# Patient Record
Sex: Male | Born: 1955 | Race: White | Hispanic: No | Marital: Single | Smoking: Never smoker
Health system: Southern US, Community
[De-identification: ages and names within clinical notes are randomized; demographics above are authoritative.]

## PROBLEM LIST (undated history)

## (undated) DIAGNOSIS — I255 Ischemic cardiomyopathy: Secondary | ICD-10-CM

## (undated) DIAGNOSIS — G4733 Obstructive sleep apnea (adult) (pediatric): Secondary | ICD-10-CM

## (undated) DIAGNOSIS — I2510 Atherosclerotic heart disease of native coronary artery without angina pectoris: Secondary | ICD-10-CM

## (undated) DIAGNOSIS — E119 Type 2 diabetes mellitus without complications: Secondary | ICD-10-CM

## (undated) DIAGNOSIS — E781 Pure hyperglyceridemia: Secondary | ICD-10-CM

## (undated) DIAGNOSIS — I7121 Aneurysm of the ascending aorta, without rupture: Secondary | ICD-10-CM

## (undated) DIAGNOSIS — E785 Hyperlipidemia, unspecified: Secondary | ICD-10-CM

## (undated) DIAGNOSIS — I480 Paroxysmal atrial fibrillation: Secondary | ICD-10-CM

## (undated) HISTORY — DX: Type 2 diabetes mellitus without complications: E11.9

## (undated) HISTORY — DX: Hyperlipidemia, unspecified: E78.5

## (undated) HISTORY — DX: Atherosclerotic heart disease of native coronary artery without angina pectoris: I25.10

## (undated) HISTORY — DX: Aneurysm of the ascending aorta, without rupture: I71.21

## (undated) HISTORY — DX: Ischemic cardiomyopathy: I25.5

## (undated) HISTORY — DX: Paroxysmal atrial fibrillation: I48.0

## (undated) HISTORY — PX: ROTATOR CUFF REPAIR: SHX139

---

## 2010-10-21 DIAGNOSIS — G459 Transient cerebral ischemic attack, unspecified: Secondary | ICD-10-CM

## 2010-10-21 HISTORY — DX: Transient cerebral ischemic attack, unspecified: G45.9

## 2015-09-27 DIAGNOSIS — K631 Perforation of intestine (nontraumatic): Secondary | ICD-10-CM

## 2015-09-27 HISTORY — DX: Perforation of intestine (nontraumatic): K63.1

## 2015-10-02 DIAGNOSIS — K6819 Other retroperitoneal abscess: Secondary | ICD-10-CM | POA: Insufficient documentation

## 2015-10-02 HISTORY — DX: Other retroperitoneal abscess: K68.19

## 2016-01-25 DIAGNOSIS — I10 Essential (primary) hypertension: Secondary | ICD-10-CM | POA: Diagnosis present

## 2016-01-25 HISTORY — DX: Essential (primary) hypertension: I10

## 2016-04-09 DIAGNOSIS — E559 Vitamin D deficiency, unspecified: Secondary | ICD-10-CM | POA: Insufficient documentation

## 2016-04-15 DIAGNOSIS — E785 Hyperlipidemia, unspecified: Secondary | ICD-10-CM | POA: Insufficient documentation

## 2016-10-07 DIAGNOSIS — H18601 Keratoconus, unspecified, right eye: Secondary | ICD-10-CM

## 2016-10-07 DIAGNOSIS — Z947 Corneal transplant status: Secondary | ICD-10-CM

## 2016-10-07 HISTORY — DX: Corneal transplant status: Z94.7

## 2016-10-07 HISTORY — DX: Keratoconus, unspecified, right eye: H18.601

## 2016-12-13 DIAGNOSIS — E11649 Type 2 diabetes mellitus with hypoglycemia without coma: Secondary | ICD-10-CM

## 2016-12-13 DIAGNOSIS — Z794 Long term (current) use of insulin: Secondary | ICD-10-CM

## 2018-02-02 DIAGNOSIS — N401 Enlarged prostate with lower urinary tract symptoms: Secondary | ICD-10-CM

## 2018-02-02 DIAGNOSIS — N138 Other obstructive and reflux uropathy: Secondary | ICD-10-CM

## 2018-02-02 HISTORY — DX: Benign prostatic hyperplasia with lower urinary tract symptoms: N40.1

## 2018-02-02 HISTORY — DX: Benign prostatic hyperplasia with lower urinary tract symptoms: N13.8

## 2018-06-15 DIAGNOSIS — Z961 Presence of intraocular lens: Secondary | ICD-10-CM | POA: Insufficient documentation

## 2018-06-15 DIAGNOSIS — H5213 Myopia, bilateral: Secondary | ICD-10-CM

## 2018-06-15 HISTORY — DX: Myopia, bilateral: H52.13

## 2018-06-15 HISTORY — DX: Presence of intraocular lens: Z96.1

## 2018-07-08 ENCOUNTER — Other Ambulatory Visit: Payer: Self-pay | Admitting: Internal Medicine

## 2018-07-08 DIAGNOSIS — C61 Malignant neoplasm of prostate: Secondary | ICD-10-CM

## 2018-08-21 ENCOUNTER — Other Ambulatory Visit: Payer: Self-pay

## 2018-10-01 ENCOUNTER — Other Ambulatory Visit: Payer: Self-pay | Admitting: Urology

## 2018-10-01 ENCOUNTER — Ambulatory Visit
Admission: RE | Admit: 2018-10-01 | Discharge: 2018-10-01 | Disposition: A | Discharge status: Home / Self Care | Payer: BLUE CROSS/BLUE SHIELD | Source: Ambulatory Visit | Attending: Internal Medicine | Admitting: Internal Medicine

## 2018-10-01 DIAGNOSIS — C61 Malignant neoplasm of prostate: Secondary | ICD-10-CM

## 2018-10-01 MED ORDER — GADOBENATE DIMEGLUMINE 529 MG/ML IV SOLN
10.0000 mL | Freq: Once | INTRAVENOUS | Status: AC | PRN
  Administered 2018-10-01: 10 mL via INTRAVENOUS

## 2019-09-20 DIAGNOSIS — M75122 Complete rotator cuff tear or rupture of left shoulder, not specified as traumatic: Secondary | ICD-10-CM

## 2019-09-20 HISTORY — DX: Complete rotator cuff tear or rupture of left shoulder, not specified as traumatic: M75.122

## 2019-10-22 DIAGNOSIS — C61 Malignant neoplasm of prostate: Secondary | ICD-10-CM

## 2019-10-22 DIAGNOSIS — Q6612 Congenital talipes calcaneovarus, left foot: Secondary | ICD-10-CM | POA: Insufficient documentation

## 2019-10-22 HISTORY — DX: Congenital talipes calcaneovarus, left foot: Q66.12

## 2019-10-22 HISTORY — DX: Malignant neoplasm of prostate: C61

## 2019-12-31 DIAGNOSIS — Z8631 Personal history of diabetic foot ulcer: Secondary | ICD-10-CM | POA: Insufficient documentation

## 2019-12-31 HISTORY — DX: Personal history of diabetic foot ulcer: Z86.31

## 2020-10-21 HISTORY — PX: BELOW KNEE LEG AMPUTATION: SUR23

## 2021-03-31 DIAGNOSIS — F5104 Psychophysiologic insomnia: Secondary | ICD-10-CM | POA: Insufficient documentation

## 2021-06-28 DIAGNOSIS — Z794 Long term (current) use of insulin: Secondary | ICD-10-CM

## 2021-06-28 DIAGNOSIS — E1165 Type 2 diabetes mellitus with hyperglycemia: Secondary | ICD-10-CM

## 2021-06-28 HISTORY — DX: Long term (current) use of insulin: Z79.4

## 2021-06-28 HISTORY — DX: Type 2 diabetes mellitus with hyperglycemia: E11.65

## 2021-06-29 DIAGNOSIS — E440 Moderate protein-calorie malnutrition: Secondary | ICD-10-CM | POA: Diagnosis present

## 2021-07-27 ENCOUNTER — Inpatient Hospital Stay (HOSPITAL_COMMUNITY): Payer: BC Managed Care – PPO

## 2021-07-27 ENCOUNTER — Inpatient Hospital Stay (HOSPITAL_COMMUNITY)
Admission: EM | Admit: 2021-07-27 | Discharge: 2021-07-30 | DRG: 070 | Disposition: A | Discharge status: Home Health | Payer: BC Managed Care – PPO | Attending: Internal Medicine | Admitting: Internal Medicine

## 2021-07-27 ENCOUNTER — Emergency Department (HOSPITAL_COMMUNITY): Payer: BC Managed Care – PPO

## 2021-07-27 DIAGNOSIS — E872 Acidosis, unspecified: Secondary | ICD-10-CM | POA: Diagnosis present

## 2021-07-27 DIAGNOSIS — Z7902 Long term (current) use of antithrombotics/antiplatelets: Secondary | ICD-10-CM

## 2021-07-27 DIAGNOSIS — R17 Unspecified jaundice: Secondary | ICD-10-CM | POA: Diagnosis present

## 2021-07-27 DIAGNOSIS — R4182 Altered mental status, unspecified: Secondary | ICD-10-CM | POA: Diagnosis not present

## 2021-07-27 DIAGNOSIS — A419 Sepsis, unspecified organism: Secondary | ICD-10-CM | POA: Diagnosis present

## 2021-07-27 DIAGNOSIS — Z7984 Long term (current) use of oral hypoglycemic drugs: Secondary | ICD-10-CM | POA: Diagnosis not present

## 2021-07-27 DIAGNOSIS — E1169 Type 2 diabetes mellitus with other specified complication: Secondary | ICD-10-CM | POA: Diagnosis present

## 2021-07-27 DIAGNOSIS — Z7985 Long-term (current) use of injectable non-insulin antidiabetic drugs: Secondary | ICD-10-CM | POA: Diagnosis not present

## 2021-07-27 DIAGNOSIS — Z20822 Contact with and (suspected) exposure to covid-19: Secondary | ICD-10-CM | POA: Diagnosis present

## 2021-07-27 DIAGNOSIS — N3000 Acute cystitis without hematuria: Secondary | ICD-10-CM | POA: Diagnosis present

## 2021-07-27 DIAGNOSIS — Z79891 Long term (current) use of opiate analgesic: Secondary | ICD-10-CM | POA: Diagnosis not present

## 2021-07-27 DIAGNOSIS — I6601 Occlusion and stenosis of right middle cerebral artery: Secondary | ICD-10-CM | POA: Diagnosis present

## 2021-07-27 DIAGNOSIS — I10 Essential (primary) hypertension: Secondary | ICD-10-CM | POA: Diagnosis present

## 2021-07-27 DIAGNOSIS — G9341 Metabolic encephalopathy: Principal | ICD-10-CM | POA: Diagnosis present

## 2021-07-27 DIAGNOSIS — Z8673 Personal history of transient ischemic attack (TIA), and cerebral infarction without residual deficits: Secondary | ICD-10-CM | POA: Diagnosis not present

## 2021-07-27 DIAGNOSIS — E162 Hypoglycemia, unspecified: Secondary | ICD-10-CM

## 2021-07-27 DIAGNOSIS — Z794 Long term (current) use of insulin: Secondary | ICD-10-CM | POA: Diagnosis not present

## 2021-07-27 DIAGNOSIS — N401 Enlarged prostate with lower urinary tract symptoms: Secondary | ICD-10-CM | POA: Diagnosis present

## 2021-07-27 DIAGNOSIS — E782 Mixed hyperlipidemia: Secondary | ICD-10-CM | POA: Diagnosis present

## 2021-07-27 DIAGNOSIS — R6510 Systemic inflammatory response syndrome (SIRS) of non-infectious origin without acute organ dysfunction: Secondary | ICD-10-CM | POA: Diagnosis present

## 2021-07-27 DIAGNOSIS — X58XXXA Exposure to other specified factors, initial encounter: Secondary | ICD-10-CM | POA: Diagnosis present

## 2021-07-27 DIAGNOSIS — I609 Nontraumatic subarachnoid hemorrhage, unspecified: Secondary | ICD-10-CM

## 2021-07-27 DIAGNOSIS — S066XAA Traumatic subarachnoid hemorrhage with loss of consciousness status unknown, initial encounter: Secondary | ICD-10-CM | POA: Diagnosis present

## 2021-07-27 DIAGNOSIS — R9431 Abnormal electrocardiogram [ECG] [EKG]: Secondary | ICD-10-CM | POA: Diagnosis present

## 2021-07-27 DIAGNOSIS — Z79899 Other long term (current) drug therapy: Secondary | ICD-10-CM | POA: Diagnosis not present

## 2021-07-27 DIAGNOSIS — Z789 Other specified health status: Secondary | ICD-10-CM

## 2021-07-27 DIAGNOSIS — Z89512 Acquired absence of left leg below knee: Secondary | ICD-10-CM | POA: Diagnosis not present

## 2021-07-27 DIAGNOSIS — Z7982 Long term (current) use of aspirin: Secondary | ICD-10-CM

## 2021-07-27 DIAGNOSIS — E1142 Type 2 diabetes mellitus with diabetic polyneuropathy: Secondary | ICD-10-CM | POA: Diagnosis present

## 2021-07-27 DIAGNOSIS — E11649 Type 2 diabetes mellitus with hypoglycemia without coma: Secondary | ICD-10-CM | POA: Diagnosis present

## 2021-07-27 DIAGNOSIS — N138 Other obstructive and reflux uropathy: Secondary | ICD-10-CM | POA: Diagnosis present

## 2021-07-27 DIAGNOSIS — R509 Fever, unspecified: Secondary | ICD-10-CM

## 2021-07-27 LAB — URINALYSIS, ROUTINE W REFLEX MICROSCOPIC
Bacteria, UA: NONE SEEN
Bilirubin Urine: NEGATIVE
Glucose, UA: 150 mg/dL — AB
Ketones, ur: 20 mg/dL — AB
Nitrite: NEGATIVE
Protein, ur: 30 mg/dL — AB
Specific Gravity, Urine: 1.017 (ref 1.005–1.030)
pH: 5 (ref 5.0–8.0)

## 2021-07-27 LAB — CBC WITH DIFFERENTIAL/PLATELET
Abs Immature Granulocytes: 0.05 10*3/uL (ref 0.00–0.07)
Basophils Absolute: 0.1 10*3/uL (ref 0.0–0.1)
Basophils Relative: 1 %
Eosinophils Absolute: 0 10*3/uL (ref 0.0–0.5)
Eosinophils Relative: 0 %
HCT: 50.8 % (ref 39.0–52.0)
Hemoglobin: 17 g/dL (ref 13.0–17.0)
Immature Granulocytes: 0 %
Lymphocytes Relative: 10 %
Lymphs Abs: 1.3 10*3/uL (ref 0.7–4.0)
MCH: 32.7 pg (ref 26.0–34.0)
MCHC: 33.5 g/dL (ref 30.0–36.0)
MCV: 97.7 fL (ref 80.0–100.0)
Monocytes Absolute: 1.1 10*3/uL — ABNORMAL HIGH (ref 0.1–1.0)
Monocytes Relative: 9 %
Neutro Abs: 9.9 10*3/uL — ABNORMAL HIGH (ref 1.7–7.7)
Neutrophils Relative %: 80 %
Platelets: 121 10*3/uL — ABNORMAL LOW (ref 150–400)
RBC: 5.2 MIL/uL (ref 4.22–5.81)
RDW: 13.6 % (ref 11.5–15.5)
WBC: 12.5 10*3/uL — ABNORMAL HIGH (ref 4.0–10.5)
nRBC: 0 % (ref 0.0–0.2)

## 2021-07-27 LAB — APTT: aPTT: 20 seconds — ABNORMAL LOW (ref 24–36)

## 2021-07-27 LAB — CBG MONITORING, ED
Glucose-Capillary: 74 mg/dL (ref 70–99)
Glucose-Capillary: 99 mg/dL (ref 70–99)

## 2021-07-27 LAB — COMPREHENSIVE METABOLIC PANEL
ALT: 29 U/L (ref 0–44)
AST: 58 U/L — ABNORMAL HIGH (ref 15–41)
Albumin: 3.6 g/dL (ref 3.5–5.0)
Alkaline Phosphatase: 82 U/L (ref 38–126)
Anion gap: 10 (ref 5–15)
BUN: 21 mg/dL (ref 8–23)
CO2: 20 mmol/L — ABNORMAL LOW (ref 22–32)
Calcium: 9.1 mg/dL (ref 8.9–10.3)
Chloride: 108 mmol/L (ref 98–111)
Creatinine, Ser: 0.83 mg/dL (ref 0.61–1.24)
GFR, Estimated: >60 mL/min (ref >60)
Glucose, Bld: 51 mg/dL — ABNORMAL LOW (ref 70–99)
Potassium: 4 mmol/L (ref 3.5–5.1)
Sodium: 138 mmol/L (ref 135–145)
Total Bilirubin: 1.9 mg/dL — ABNORMAL HIGH (ref 0.3–1.2)
Total Protein: 6.3 g/dL — ABNORMAL LOW (ref 6.5–8.1)

## 2021-07-27 LAB — PROTIME-INR
INR: 1 (ref 0.8–1.2)
Prothrombin Time: 13.1 seconds (ref 11.4–15.2)

## 2021-07-27 LAB — RESP PANEL BY RT-PCR (FLU A&B, COVID) ARPGX2
Influenza A by PCR: NEGATIVE
Influenza B by PCR: NEGATIVE
SARS Coronavirus 2 by RT PCR: NEGATIVE

## 2021-07-27 LAB — LACTIC ACID, PLASMA: Lactic Acid, Venous: 1.8 mmol/L (ref 0.5–1.9)

## 2021-07-27 MED ORDER — SODIUM CHLORIDE 0.9 % IV SOLN
2.0000 g | Freq: Three times a day (TID) | INTRAVENOUS | Status: DC
  Administered 2021-07-28: 2 g via INTRAVENOUS
  Filled 2021-07-27: qty 2

## 2021-07-27 MED ORDER — SODIUM CHLORIDE 0.9 % IV SOLN
2.0000 g | Freq: Once | INTRAVENOUS | Status: AC
  Administered 2021-07-27: 2 g via INTRAVENOUS
  Filled 2021-07-27: qty 2

## 2021-07-27 MED ORDER — LACTATED RINGERS IV SOLN: INTRAVENOUS | Status: DC

## 2021-07-27 MED ORDER — DEXTROSE 50 % IV SOLN
1.0000 | Freq: Once | INTRAVENOUS | Status: AC
  Administered 2021-07-27: 50 mL via INTRAVENOUS
  Filled 2021-07-27: qty 50

## 2021-07-27 MED ORDER — IOHEXOL 350 MG/ML SOLN
75.0000 mL | Freq: Once | INTRAVENOUS | Status: AC | PRN
  Administered 2021-07-27: 75 mL via INTRAVENOUS

## 2021-07-27 MED ORDER — LORAZEPAM 2 MG/ML IJ SOLN
2.0000 mg | Freq: Once | INTRAMUSCULAR | Status: AC
  Administered 2021-07-27: 2 mg via INTRAVENOUS
  Filled 2021-07-27: qty 1

## 2021-07-27 MED ORDER — VANCOMYCIN HCL 1500 MG/300ML IV SOLN
1500.0000 mg | Freq: Two times a day (BID) | INTRAVENOUS | Status: DC
  Filled 2021-07-27: qty 300

## 2021-07-27 MED ORDER — LACTATED RINGERS IV BOLUS
1000.0000 mL | Freq: Once | INTRAVENOUS | Status: AC
  Administered 2021-07-27: 1000 mL via INTRAVENOUS

## 2021-07-27 MED ORDER — ACETAMINOPHEN 650 MG RE SUPP
650.0000 mg | Freq: Once | RECTAL | Status: AC
  Administered 2021-07-27: 650 mg via RECTAL
  Filled 2021-07-27: qty 1

## 2021-07-27 MED ORDER — VANCOMYCIN HCL 2000 MG/400ML IV SOLN
2000.0000 mg | Freq: Once | INTRAVENOUS | Status: AC
  Administered 2021-07-27: 2000 mg via INTRAVENOUS
  Filled 2021-07-27: qty 400

## 2021-07-27 MED ORDER — METRONIDAZOLE 500 MG/100ML IV SOLN
500.0000 mg | Freq: Once | INTRAVENOUS | Status: AC
  Administered 2021-07-27: 500 mg via INTRAVENOUS
  Filled 2021-07-27: qty 100

## 2021-07-27 MED ORDER — LEVETIRACETAM 500 MG PO TABS
500.0000 mg | ORAL_TABLET | Freq: Two times a day (BID) | ORAL | Status: DC
  Administered 2021-07-27 – 2021-07-30 (×6): 500 mg via ORAL
  Filled 2021-07-27 (×6): qty 1

## 2021-07-27 NOTE — ED Provider Notes (Signed)
[facility] EMERGENCY DEPARTMENT Provider Note   CSN: [id number] Arrival date & time: 07/27/21  1458     History Chief Complaint  Patient presents with   Code Sepsis   Altered Mental Status    Peter Yang is a 65 y.o. male.  HPI Patient is a 65 year old male PMH of T2DM, CVA, HTN, left foot deformity, HLD, neuropathy, history of left foot necrotizing infection, BPH who is brought in by [facility] due to altered mental status.  Per EMS, they note that neighbors called the sheriff department for a wellness check due to the patient's door being open and him not being seen for several days.  They note that EMS was called yesterday and patient was found hypoglycemic.  They were able to correct his sugar level and he refused transport at that time.  Today EMS found him lying supine in his bed covered in urine.  Patient speaking in mumbles and otherwise not providing answers to questions or following commands.  They state patient lives alone.  In the field BP was 150/80, heart rate 120, respiratory rate 20, oxygen saturation at 95% on 2 L, CBG of 101.  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  Level 5 caveat due to altered mental status.  Per records, patients medication list from 07/04/21:  Scheduled Meds:  aspirin 81 mg Oral Daily@0900    atorvastatin 40 mg Oral Nightly   clopidogreL 75 mg Oral Daily@0900    enoxaparin 40 mg Subcutaneous Daily@0800    finasteride 5 mg Oral Daily@0900    gabapentin 300 mg Oral BID   insulin glargine 50 Units Subcutaneous QPM   insulin lispro 2-12 Units Subcutaneous TID WC   polyethylene glycol 17 g Oral Daily@0900    senna-docusate 1 tablet Oral BID   traZODone 150 mg Oral Nightly      No past medical history on file.  Patient Active Problem List   Diagnosis Date Noted   Acute metabolic encephalopathy 26/94/8546      No family history on file.     Home  Medications Prior to Admission medications   Medication Sig Start Date End Date Taking? Authorizing Provider  amLODipine (NORVASC) 5 MG tablet Take 5 mg by mouth daily. 03/17/21   [provider]  atorvastatin (LIPITOR) 40 MG tablet Take 40 mg by mouth daily. 03/17/21   [provider]  clopidogrel (PLAVIX) 75 MG tablet Take 75 mg by mouth daily. 03/17/21   [provider]  diclofenac Sodium (VOLTAREN) 1 % GEL SMARTSIG:Gram(s) Topical Twice Daily PRN 07/20/21   [provider]  empagliflozin (JARDIANCE) 25 MG TABS tablet Take 25 mg by mouth daily.    [provider]  finasteride (PROSCAR) 5 MG tablet Take 5 mg by mouth daily. 03/17/21   [provider]  gabapentin (NEURONTIN) 300 MG capsule Take 300 mg by mouth. 07/20/21   [provider]  insulin degludec (TRESIBA FLEXTOUCH) 200 UNIT/ML FlexTouch Pen Inject 80 Units into the skin daily.    [provider]  metoprolol tartrate (LOPRESSOR) 25 MG tablet Take 25 mg by mouth 2 (two) times daily. 03/17/21   [provider]  oxyCODONE (OXY IR/ROXICODONE) 5 MG immediate release tablet Take by mouth. 07/20/21   [provider]  potassium chloride SA (KLOR-CON) 20 MEQ tablet Take 20 mEq by mouth daily. 03/17/21   [provider]  Semaglutide, 1 MG/DOSE, (OZEMPIC, 1 MG/DOSE,) 4 MG/3ML SOPN Inject 1 mg  into the skin once a week.    [provider]  traZODone (DESYREL) 50 MG tablet Take 50-150 mg by mouth at bedtime as needed. 03/17/21   [provider]    Allergies    Patient has no allergy information on record.  Review of Systems   Review of Systems  Unable to perform ROS: Mental status change   Physical Exam Updated Vital Signs BP 139/85 (BP Location: Right Arm)   Pulse 100   Temp (!) 100.5 F (38.1 C) (Rectal)   Resp 13   Wt 91.8 kg   SpO2 95%   Physical Exam Vitals and nursing note reviewed.  Constitutional:      General: He is  in acute distress.     Appearance: He is ill-appearing. He is not toxic-appearing or diaphoretic.     Comments: Well-developed elderly male.  Awake but not oriented.  Not answering questions.  HENT:     Head: Normocephalic and atraumatic.     Right Ear: External ear normal.     Left Ear: External ear normal.     Nose: Nose normal.     Mouth/Throat:     Mouth: Mucous membranes are moist.     Pharynx: Oropharynx is clear. No oropharyngeal exudate or posterior oropharyngeal erythema.  Eyes:     General: No scleral icterus.       Right eye: No discharge.        Left eye: No discharge.     Conjunctiva/sclera: Conjunctivae normal.     Pupils: Pupils are equal, round, and reactive to light.  Cardiovascular:     Rate and Rhythm: Regular rhythm. Tachycardia present.     Pulses: Normal pulses.     Heart sounds: Normal heart sounds. No murmur heard.   No friction rub. No gallop.  Pulmonary:     Effort: Pulmonary effort is normal. No respiratory distress.     Breath sounds: Normal breath sounds. No stridor. No wheezing, rhonchi or rales.  Abdominal:     General: Abdomen is flat.     Palpations: Abdomen is soft.     Tenderness: There is no abdominal tenderness.     Comments: Abdomen is flat and soft.  Patient does not appear to exhibit signs of pain with deep palpation of the abdomen.  Ecchymosis noted to the right abdomen.  Musculoskeletal:        General: Normal range of motion.     Cervical back: Normal range of motion and neck supple. No tenderness.     Comments: Left BKA.  Staples intact along the distal joint.  Mild erythema noted along part of the incision but no significant erythema or cellulitis noted.  Skin:    General: Skin is warm and dry.  Neurological:     Comments: Lying supine.  Appears to be moving all 4 extremities.  Does not answer questions or follow commands.  Pupils are equal, round, and reactive to light.    ED Results / Procedures / Treatments   Labs (all labs  ordered are listed, but only abnormal results are displayed) Labs Reviewed  COMPREHENSIVE METABOLIC PANEL - Abnormal; Notable for the following components:      Result Value   CO2 20 (*)    Glucose, Bld 51 (*)    Total Protein 6.3 (*)    AST 58 (*)    Total Bilirubin 1.9 (*)    All other components within normal limits  CBC WITH DIFFERENTIAL/PLATELET - Abnormal; Notable for the  following components:   WBC 12.5 (*)    Platelets 121 (*)    Neutro Abs 9.9 (*)    Monocytes Absolute 1.1 (*)    All other components within normal limits  APTT - Abnormal; Notable for the following components:   aPTT 20 (*)    All other components within normal limits  URINALYSIS, ROUTINE W REFLEX MICROSCOPIC - Abnormal; Notable for the following components:   Glucose, UA 150 (*)    Hgb urine dipstick MODERATE (*)    Ketones, ur 20 (*)    Protein, ur 30 (*)    Leukocytes,Ua SMALL (*)    All other components within normal limits  RESP PANEL BY RT-PCR (FLU A&B, COVID) ARPGX2  URINE CULTURE  CULTURE, BLOOD (ROUTINE X 2)  CULTURE, BLOOD (ROUTINE X 2)  LACTIC ACID, PLASMA  PROTIME-INR  LACTIC ACID, PLASMA  CK  CBG MONITORING, ED  CBG MONITORING, ED   EKG EKG Interpretation  Date/Time:  Friday July 27 2021 15:06:58 EDT Ventricular Rate:  120 PR Interval:    QRS Duration: 100 QT Interval:  362 QTC Calculation: 512 R Axis:   -74 Text Interpretation: Atrial fibrillation Multiple ventricular premature complexes Left anterior fascicular block Prolonged QT interval Confirmed by Thamas Jaegers ([id number]) on 07/27/2021 9:02:38 PM  Radiology CT HEAD WO CONTRAST (5MM)  Addendum Date: 07/27/2021   ADDENDUM REPORT: 07/27/2021 22:08 ADDENDUM: These results were called by telephone at the time of interpretation on 07/27/2021 at 10:05 pm to provider [facility] , who verbally acknowledged these results. Electronically Signed   By: Iven Finn M.D.   On: 07/27/2021 22:08   Result Date: 07/27/2021 CLINICAL  DATA:  Delirium. EXAM: CT HEAD WITHOUT CONTRAST TECHNIQUE: Contiguous axial images were obtained from the base of the skull through the vertex without intravenous contrast. COMPARISON:  None. BRAIN: BRAIN Cerebral ventricle sizes are concordant with the degree of cerebral volume loss. Patchy and confluent areas of decreased attenuation are noted throughout the deep and periventricular white matter of the cerebral hemispheres bilaterally, compatible with chronic microvascular ischemic disease. No evidence of large-territorial acute infarction. No parenchymal hemorrhage. No mass lesion. Extra-axial hyperdense fluid within the right sylvian fissure. No mass effect or midline shift. No hydrocephalus. Basilar cisterns are patent. Vascular: No hyperdense vessel. Atherosclerotic calcifications are present within the cavernous internal carotid and vertebral arteries. Skull: No acute fracture or focal lesion. Sinuses/Orbits: Paranasal sinuses and mastoid air cells are clear. The orbits are unremarkable. Other: None. IMPRESSION: Acute right sylvian fissure subarachnoid hemorrhage. Electronically Signed: By: Iven Finn M.D. On: 07/27/2021 20:37   DG Chest Port 1 View  Result Date: 07/27/2021 CLINICAL DATA:  Questionable sepsis, evaluate for abnormality. EXAM: PORTABLE CHEST 1 VIEW COMPARISON:  Chest radiograph 12/12/2020 FINDINGS: Of note, the patient is a rotated on this portable examination. [name] chin overlies the left apex. The heart size and mediastinal contours are within normal limits, allowing for differences in patient positioning. Aortic calcification. Both lungs are clear. No pleural effusion or pneumothorax. The visualized skeletal structures are unremarkable. IMPRESSION: No active disease. Electronically Signed   By: Ileana Roup M.D.   On: 07/27/2021 15:51    Procedures .Critical Care Performed by: Rayna Sexton, PA-C Authorized by: Rayna Sexton, PA-C   Critical care provider  statement:    Critical care time (minutes):  60   Critical care was necessary to treat or prevent imminent or life-threatening deterioration of the following conditions:  Sepsis and CNS failure or compromise  Critical care was time spent personally by me on the following activities:  Discussions with consultants, evaluation of patient's response to treatment, examination of patient, ordering and performing treatments and interventions, ordering and review of laboratory studies, ordering and review of radiographic studies, pulse oximetry, re-evaluation of patient's condition, obtaining history from patient or surrogate and review of old charts   Medications Ordered in ED Medications  lactated ringers infusion ( Intravenous New Bag/Given 07/27/21 2248)  ceFEPIme (MAXIPIME) 2 g in sodium chloride 0.9 % 100 mL IVPB (has no administration in time range)  vancomycin (VANCOREADY) IVPB 1500 mg/300 mL (has no administration in time range)  levETIRAcetam (KEPPRA) tablet 500 mg (has no administration in time range)  lactated ringers bolus 1,000 mL (0 mLs Intravenous Stopped 07/27/21 1630)  acetaminophen (TYLENOL) suppository 650 mg (650 mg Rectal Given 07/27/21 1544)  lactated ringers bolus 1,000 mL (0 mLs Intravenous Stopped 07/27/21 1928)  ceFEPIme (MAXIPIME) 2 g in sodium chloride 0.9 % 100 mL IVPB (0 g Intravenous Stopped 07/27/21 1736)  metroNIDAZOLE (FLAGYL) IVPB 500 mg (0 mg Intravenous Stopped 07/27/21 1737)  vancomycin (VANCOREADY) IVPB 2000 mg/400 mL (0 mg Intravenous Stopped 07/27/21 2016)  dextrose 50 % solution 50 mL (50 mLs Intravenous Given 07/27/21 1704)  LORazepam (ATIVAN) injection 2 mg (2 mg Intravenous Given 07/27/21 1942)   ED Course  I have reviewed the triage vital signs and the nursing notes.  Pertinent labs & imaging results that were available during my care of the patient were reviewed by me and considered in my medical decision making (see chart for details).  Clinical Course as of  07/27/21 2319  Fri Jul 27, 2021  1556 Patient started on IV fluids as well as antipyretics.  Reassessed and now slightly more alert.  Still fatigued and altered but now intermittently following commands. [LJ]  1937 Patient reassessed.  Now awake and more alert.  Appears A&O x2.  Still having difficulty with word findings. Intermittently answering questions.  Becoming combative with staff and stating "he wants his IV out and wants to go home".  We will give him a dose of IV Ativan.  We will continue to closely monitor.  Patient pending CT scan, COVID test, and likely admission. [LJ]  2117 Patient discussed with Dr. [name] with neurosurgery.  He is concerned that this could possibly be traumatic.  Request that we obtain a CTA of the head at this time. [LJ]    Clinical Course User Index [LJ] Rayna Sexton, PA-C   MDM Rules/Calculators/A&P                          Pt is a 65 y.o. male who presents to the emergency department febrile, tachycardic, altered, and hypoglycemic.  Labs: CBC with a white count of 12.5, platelets of 121, neutrophils of 9.9, monocytes of 1.1. CMP with a CO2 of 20, glucose of 51, total protein of 6.3, AST of 58, total bilirubin of 1.9. UA with 150 glucose, moderate hemoglobin, 20 ketones, 30 protein, small leukocytes, 21-50 white blood cells. Prothrombin time of 13.1 with an INR of 1. Blood culture and urine culture obtained. Lactic acid within normal limits at 1.8. CK pending. Respiratory panel is negative.  Imaging: CT scan of the head without contrast shows an acute right sylvian fissure subarachnoid hemorrhage. Chest x-ray is negative.  I, Rayna Sexton, PA-C, personally reviewed and evaluated these images and lab results as part of my medical decision-making.  Patient initially presented  tachycardic and febrile.  He was treated with IV fluids as well as broad-spectrum antibiotics.  Unsure the source of his fever at this time.  Possibly urinary.  UA does show  small leukocytes as well as 21-50 white blood cells.  Chest x-ray is negative.  Respiratory panel is negative.  Lactic acid within normal limits at 1.8.  Does not meet criteria for septic shock.  Patient initially normoglycemic but developed hypoglycemia since arriving to the ED.  He was treated with D50.  After being treated with IV fluids, D50, as well as antibiotics his mentation improved significantly.  Patient now answering questions and following commands.    Given his altered mental status a CT scan was obtained which showed an acute right sylvian fissure subarachnoid hemorrhage.  Patient discussed with Dr. [name] with neurosurgery.  They are concerned this could possibly be traumatic so they recommended that we follow-up with a CTA of the head.  This has been ordered.  Recommend 500 mg of Keppra twice daily for seizure prophylaxis.  Also recommend admission to medicine.  Patient discussed with Dr. Reva Bores with the medicine team who accept the patient at this time.  Note: Portions of this report may have been transcribed using voice recognition software. Every effort was made to ensure accuracy; however, inadvertent computerized transcription errors may be present.   Final Clinical Impression(s) / ED Diagnoses Final diagnoses:  Altered mental status, unspecified altered mental status type  Hypoglycemia  Subarachnoid hemorrhage (HCC)  Fever of unknown origin   Rx / DC Orders ED Discharge Orders     None        Rayna Sexton, PA-C 07/27/21 2322    Luna Fuse, MD 08/08/21 [phone]

## 2021-07-27 NOTE — Progress Notes (Signed)
Pharmacy Antibiotic Note  Peter Yang is a 65 y.o. male admitted on 07/27/2021 with sepsis.  Pharmacy has been consulted for cefepime and vancomycin dosing.  Patient with a history of T2DM, CVA, HTN, left foot deformity, HLD, neuropathy, history of left foot necrotizing infection, BPH . Patient presenting with altered mental status.  SCr is 0.83 WBC 12.5; LA 1.8; T 100.64F  Plan: Flagyl per MD Cefepime 2g q8h Vancomycin 2000mg  once then 1500mg  q12hr (eAUC 480) unless change in renal function Trend WBC, Fever, renal function, and clinical course F/u cultures De-escalate when able  Weight: 91.8 kg (202 lb 6.1 oz)  Temp (24hrs), Avg:100.5 F (38.1 C), Min:100.5 F (38.1 C), Max:100.5 F (38.1 C)  Recent Labs  Lab 07/27/21 1507  WBC 12.5*    CrCl cannot be calculated (No successful lab value found.).    Not on File  Antimicrobials this admission: Metronidazole 10/7 >>  Cefepime 10/7 >>  Vancomycin 10/7 >>  Microbiology results: Pending  Thank you for allowing pharmacy to be a part of this patient's care.  Lorelei Pont, PharmD, BCPS 07/27/2021 7:25 PM ED Clinical Pharmacist -  [phone]

## 2021-07-27 NOTE — ED Triage Notes (Signed)
Pt BIB [facility] from home. Per ems, pt lives at home by himself, EMS and sheriff department found him at home in bed w AMS while doing welfare check. EMS was dispatched to pt's house yesterday d/t hypoglycemia, but pt refused to come to hospital. Baseline A&O X4 .   -500 ml NS given by EMS   -BP 150/80 -120HR -RR20 -95% 2L -CBG 101

## 2021-07-27 NOTE — ED Notes (Signed)
Patient transported to CT 

## 2021-07-28 ENCOUNTER — Inpatient Hospital Stay (HOSPITAL_COMMUNITY): Payer: BC Managed Care – PPO

## 2021-07-28 ENCOUNTER — Encounter (HOSPITAL_COMMUNITY): Payer: Self-pay | Admitting: Internal Medicine

## 2021-07-28 ENCOUNTER — Other Ambulatory Visit: Payer: Self-pay

## 2021-07-28 DIAGNOSIS — Z794 Long term (current) use of insulin: Secondary | ICD-10-CM

## 2021-07-28 DIAGNOSIS — E1169 Type 2 diabetes mellitus with other specified complication: Secondary | ICD-10-CM

## 2021-07-28 DIAGNOSIS — R9431 Abnormal electrocardiogram [ECG] [EKG]: Secondary | ICD-10-CM

## 2021-07-28 DIAGNOSIS — E1142 Type 2 diabetes mellitus with diabetic polyneuropathy: Secondary | ICD-10-CM

## 2021-07-28 DIAGNOSIS — R4182 Altered mental status, unspecified: Secondary | ICD-10-CM | POA: Diagnosis not present

## 2021-07-28 DIAGNOSIS — I10 Essential (primary) hypertension: Secondary | ICD-10-CM

## 2021-07-28 DIAGNOSIS — I609 Nontraumatic subarachnoid hemorrhage, unspecified: Secondary | ICD-10-CM | POA: Diagnosis not present

## 2021-07-28 DIAGNOSIS — N138 Other obstructive and reflux uropathy: Secondary | ICD-10-CM

## 2021-07-28 DIAGNOSIS — I6601 Occlusion and stenosis of right middle cerebral artery: Secondary | ICD-10-CM | POA: Diagnosis present

## 2021-07-28 DIAGNOSIS — E782 Mixed hyperlipidemia: Secondary | ICD-10-CM

## 2021-07-28 DIAGNOSIS — A419 Sepsis, unspecified organism: Secondary | ICD-10-CM | POA: Diagnosis present

## 2021-07-28 DIAGNOSIS — G9341 Metabolic encephalopathy: Secondary | ICD-10-CM | POA: Diagnosis not present

## 2021-07-28 DIAGNOSIS — N3000 Acute cystitis without hematuria: Secondary | ICD-10-CM | POA: Diagnosis present

## 2021-07-28 DIAGNOSIS — N401 Enlarged prostate with lower urinary tract symptoms: Secondary | ICD-10-CM | POA: Diagnosis not present

## 2021-07-28 DIAGNOSIS — E11649 Type 2 diabetes mellitus with hypoglycemia without coma: Secondary | ICD-10-CM

## 2021-07-28 DIAGNOSIS — R6510 Systemic inflammatory response syndrome (SIRS) of non-infectious origin without acute organ dysfunction: Secondary | ICD-10-CM | POA: Diagnosis present

## 2021-07-28 HISTORY — DX: Occlusion and stenosis of right middle cerebral artery: I66.01

## 2021-07-28 HISTORY — DX: Type 2 diabetes mellitus with diabetic polyneuropathy: E11.42

## 2021-07-28 HISTORY — DX: Type 2 diabetes mellitus with other specified complication: E11.69

## 2021-07-28 LAB — COMPREHENSIVE METABOLIC PANEL
ALT: 26 U/L (ref 0–44)
AST: 44 U/L — ABNORMAL HIGH (ref 15–41)
Albumin: 3.2 g/dL — ABNORMAL LOW (ref 3.5–5.0)
Alkaline Phosphatase: 69 U/L (ref 38–126)
Anion gap: 11 (ref 5–15)
BUN: 18 mg/dL (ref 8–23)
CO2: 20 mmol/L — ABNORMAL LOW (ref 22–32)
Calcium: 8.7 mg/dL — ABNORMAL LOW (ref 8.9–10.3)
Chloride: 106 mmol/L (ref 98–111)
Creatinine, Ser: 0.83 mg/dL (ref 0.61–1.24)
GFR, Estimated: >60 mL/min (ref >60)
Glucose, Bld: 83 mg/dL (ref 70–99)
Potassium: 3.1 mmol/L — ABNORMAL LOW (ref 3.5–5.1)
Sodium: 137 mmol/L (ref 135–145)
Total Bilirubin: 2.1 mg/dL — ABNORMAL HIGH (ref 0.3–1.2)
Total Protein: 5.8 g/dL — ABNORMAL LOW (ref 6.5–8.1)

## 2021-07-28 LAB — CBG MONITORING, ED
Glucose-Capillary: 104 mg/dL — ABNORMAL HIGH (ref 70–99)
Glucose-Capillary: 84 mg/dL (ref 70–99)
Glucose-Capillary: 94 mg/dL (ref 70–99)
Glucose-Capillary: 135 mg/dL — ABNORMAL HIGH (ref 70–99)
Glucose-Capillary: 103 mg/dL — ABNORMAL HIGH (ref 70–99)

## 2021-07-28 LAB — CK: Total CK: 497 U/L — ABNORMAL HIGH (ref 49–397)

## 2021-07-28 LAB — CBC WITH DIFFERENTIAL/PLATELET
Abs Immature Granulocytes: 0.04 10*3/uL (ref 0.00–0.07)
Basophils Absolute: 0.1 10*3/uL (ref 0.0–0.1)
Basophils Relative: 1 %
Eosinophils Absolute: 0.2 10*3/uL (ref 0.0–0.5)
Eosinophils Relative: 2 %
HCT: 43.2 % (ref 39.0–52.0)
Hemoglobin: 14.6 g/dL (ref 13.0–17.0)
Immature Granulocytes: 1 %
Lymphocytes Relative: 23 %
Lymphs Abs: 1.9 10*3/uL (ref 0.7–4.0)
MCH: 31.7 pg (ref 26.0–34.0)
MCHC: 33.8 g/dL (ref 30.0–36.0)
MCV: 93.7 fL (ref 80.0–100.0)
Monocytes Absolute: 1.2 10*3/uL — ABNORMAL HIGH (ref 0.1–1.0)
Monocytes Relative: 15 %
Neutro Abs: 4.9 10*3/uL (ref 1.7–7.7)
Neutrophils Relative %: 58 %
Platelets: 172 10*3/uL (ref 150–400)
RBC: 4.61 MIL/uL (ref 4.22–5.81)
RDW: 13.8 % (ref 11.5–15.5)
WBC: 8.3 10*3/uL (ref 4.0–10.5)
nRBC: 0 % (ref 0.0–0.2)

## 2021-07-28 LAB — URINE CULTURE

## 2021-07-28 LAB — GLUCOSE, CAPILLARY
Glucose-Capillary: 98 mg/dL (ref 70–99)
Glucose-Capillary: 206 mg/dL — ABNORMAL HIGH (ref 70–99)

## 2021-07-28 LAB — APTT: aPTT: 27 seconds (ref 24–36)

## 2021-07-28 LAB — VITAMIN B12: Vitamin B-12: 364 pg/mL (ref 180–914)

## 2021-07-28 LAB — PROTIME-INR
INR: 1.2 (ref 0.8–1.2)
Prothrombin Time: 14.8 seconds (ref 11.4–15.2)

## 2021-07-28 LAB — HEMOGLOBIN A1C
Hgb A1c MFr Bld: 4.7 % — ABNORMAL LOW (ref 4.8–5.6)
Mean Plasma Glucose: 88.19 mg/dL

## 2021-07-28 LAB — RPR: RPR Ser Ql: NONREACTIVE

## 2021-07-28 LAB — TSH: TSH: 2.045 u[IU]/mL (ref 0.350–4.500)

## 2021-07-28 LAB — MAGNESIUM: Magnesium: 1.6 mg/dL — ABNORMAL LOW (ref 1.7–2.4)

## 2021-07-28 MED ORDER — INSULIN DEGLUDEC 200 UNIT/ML ~~LOC~~ SOPN: PEN_INJECTOR | Freq: Every day | SUBCUTANEOUS | Status: DC

## 2021-07-28 MED ORDER — ONDANSETRON HCL 4 MG/2ML IJ SOLN: 4.0000 mg | Freq: Four times a day (QID) | INTRAMUSCULAR | Status: DC | PRN

## 2021-07-28 MED ORDER — AMLODIPINE BESYLATE 5 MG PO TABS
5.0000 mg | ORAL_TABLET | Freq: Every day | ORAL | Status: DC
  Administered 2021-07-28 – 2021-07-30 (×3): 5 mg via ORAL
  Filled 2021-07-28 (×3): qty 1

## 2021-07-28 MED ORDER — ATORVASTATIN CALCIUM 40 MG PO TABS
40.0000 mg | ORAL_TABLET | Freq: Every day | ORAL | Status: DC
  Administered 2021-07-28 – 2021-07-30 (×3): 40 mg via ORAL
  Filled 2021-07-28 (×3): qty 1

## 2021-07-28 MED ORDER — MAGNESIUM SULFATE 2 GM/50ML IV SOLN
2.0000 g | Freq: Once | INTRAVENOUS | Status: AC
  Administered 2021-07-28: 2 g via INTRAVENOUS
  Filled 2021-07-28: qty 50

## 2021-07-28 MED ORDER — ONDANSETRON HCL 4 MG PO TABS: 4.0000 mg | ORAL_TABLET | Freq: Four times a day (QID) | ORAL | Status: DC | PRN

## 2021-07-28 MED ORDER — LABETALOL HCL 5 MG/ML IV SOLN: 10.0000 mg | INTRAVENOUS | Status: DC | PRN

## 2021-07-28 MED ORDER — INSULIN ASPART 100 UNIT/ML IJ SOLN
0.0000 [IU] | INTRAMUSCULAR | Status: DC
  Administered 2021-07-28: 2 [IU] via SUBCUTANEOUS
  Administered 2021-07-29: 3 [IU] via SUBCUTANEOUS
  Administered 2021-07-29: 5 [IU] via SUBCUTANEOUS
  Administered 2021-07-29: 3 [IU] via SUBCUTANEOUS
  Administered 2021-07-29: 5 [IU] via SUBCUTANEOUS
  Administered 2021-07-29: 2 [IU] via SUBCUTANEOUS
  Administered 2021-07-30: 5 [IU] via SUBCUTANEOUS
  Administered 2021-07-30 (×2): 2 [IU] via SUBCUTANEOUS
  Administered 2021-07-30: 3 [IU] via SUBCUTANEOUS
  Administered 2021-07-30: 2 [IU] via SUBCUTANEOUS

## 2021-07-28 MED ORDER — FINASTERIDE 5 MG PO TABS
5.0000 mg | ORAL_TABLET | Freq: Every day | ORAL | Status: DC
  Administered 2021-07-28 – 2021-07-30 (×3): 5 mg via ORAL
  Filled 2021-07-28 (×3): qty 1

## 2021-07-28 MED ORDER — ACETAMINOPHEN 650 MG RE SUPP: 650.0000 mg | Freq: Four times a day (QID) | RECTAL | Status: DC | PRN

## 2021-07-28 MED ORDER — METOPROLOL TARTRATE 25 MG PO TABS
25.0000 mg | ORAL_TABLET | Freq: Two times a day (BID) | ORAL | Status: DC
  Administered 2021-07-28 – 2021-07-30 (×5): 25 mg via ORAL
  Filled 2021-07-28 (×5): qty 1

## 2021-07-28 MED ORDER — POLYETHYLENE GLYCOL 3350 17 G PO PACK: 17.0000 g | PACK | Freq: Every day | ORAL | Status: DC | PRN

## 2021-07-28 MED ORDER — ACETAMINOPHEN 325 MG PO TABS: 650.0000 mg | ORAL_TABLET | Freq: Four times a day (QID) | ORAL | Status: DC | PRN

## 2021-07-28 MED ORDER — SODIUM CHLORIDE 0.9 % IV SOLN: INTRAVENOUS | Status: AC

## 2021-07-28 MED ORDER — SODIUM CHLORIDE 0.9 % IV SOLN
1.0000 g | INTRAVENOUS | Status: DC
  Administered 2021-07-28 – 2021-07-30 (×3): 1 g via INTRAVENOUS
  Filled 2021-07-28 (×3): qty 10

## 2021-07-28 NOTE — Progress Notes (Signed)
Patient refused EEG.  RN notified and she talked to patient. Patient refused again.

## 2021-07-28 NOTE — Assessment & Plan Note (Signed)
   Incidental finding, likely chronic per my discussions with both radiology as well as Dr. Cheral Marker with neurology  Dr. Cheral Marker with neurology recommending obtaining MRI brain to ensure there is no evidence of stroke  Will proceed with consulting neurology if EEG or stroke reveal abnormalities

## 2021-07-28 NOTE — Assessment & Plan Note (Signed)
   Urinalysis positive for leukocyte Estrace with moderate amounts of white blood cells per high-powered field  We will treat for suspected urinary tract infection considering patient is presenting with multiple SIRS criteria including fever tachycardia and leukocytosis without an alternative source of potential infection   treating patient with intravenous ceftriaxone  Blood and urine cultures pending  Gentle intravenous hydration  Underlying infection may have precipitated the patient's traumatic fall.

## 2021-07-28 NOTE — Assessment & Plan Note (Signed)
   Continue home regimen of antihypertensive therapy  Target systolic blood pressure of around 140 considering subarachnoid hemorrhage.

## 2021-07-28 NOTE — Assessment & Plan Note (Signed)
   Patient found to have hypoglycemia on arrival likely secondary to poor oral intake in setting of confusion and ongoing insulin use  Holding home regimen of hypoglycemics and insulin  Performing Accu-Cheks every 4 hours for now, will transition to before every meal nightly as blood sugars normalized  If hypoglycemia persists will initiate dextrose infusion  Hemoglobin A1c ordered

## 2021-07-28 NOTE — Assessment & Plan Note (Addendum)
   Identified on initial noncontrast CT imaging of the brain  ER provider discussed case with Dr. [name] who is planning on evaluating the patient in consultation  Performing serial neurologic checks every 4 hours  On Keppra for seizure prophylaxis per neurosurgery recommendation  Target systolic blood pressures of less than 140 with as needed intravenous labetalol  Avoiding anticoagulants  Further surveillance CT imaging based on neurosurgery recommendations in the morning.

## 2021-07-28 NOTE — ED Notes (Addendum)
Pt refusing MRI. MD made aware

## 2021-07-28 NOTE — H&P (Signed)
History and Physical    Peter Yang LOV:[id number] DOB: 1956/03/02 DOA: 07/27/2021  PCP: [name], MD  Patient coming from: Home via EMS   Chief Complaint:  Chief Complaint  Patient presents with   Code Sepsis   Altered Mental Status     HPI:    65 year old male with past medical history of recent left BKA, hypertension, diabetes mellitus type 2, diabetic polyneuropathy, BPH who presents to [facility] emergency department via EMS after being found down in his apartment with associated confusion.  Of note, patient was recently hospitalized at [facility] from on 9/8 after which he had a left BKA for necrotizing left foot infection.  Patient was eventually discharged on 9/14 to rehab and was discharged from rehab back home on 9/30.  According to EMS, patient lives at home by himself.  Per my discussion with ED staff, they were informed by EMS that they had also responded the patient's home on 10/7 and during that visit patient was found to be hypoglycemic but declined transfer to the hospital.  On 10/7, EMS was called back to the home after the sheriff found the patient down on the floor and confused.  Patient was then promptly brought into [facility] emergency room for evaluation.  Upon asking the patient the circumstances of how he ended up on the ground he simply says "I don't remember."    Upon evaluation in the emergency department noncontrast CT imaging of the head revealed evidence of an acute right sylvian fissure subarachnoid hemorrhage.  ER provider discussed case with Dr. [name] with neurosurgery who recommended obtaining CT angiogram of the head to evaluate for any evidence of aneurysm.  They also recommended loading patient with Keppra.  The hospitalist group was then called to assess patient for admission to the hospital    Review of Systems:   Review of Systems  Unable to perform ROS: Mental status change   Past Medical History:  Diagnosis  Date   BPH with obstruction/lower urinary tract symptoms 02/02/2018   Controlled type 2 diabetes mellitus with hyperglycemia, with long-term current use of insulin ([place]) 06/28/2021   Diabetic polyneuropathy associated with type 2 diabetes mellitus ([place]) 07/28/2021   Essential hypertension 01/25/2016   Mixed diabetic hyperlipidemia associated with type 2 diabetes mellitus ([place]) 07/28/2021    History reviewed. No pertinent surgical history.   reports that he has never smoked. He has never used smokeless tobacco. He reports that he does not drink alcohol and does not use drugs.  No Known Allergies  Family History  Family history unknown: Yes     Prior to Admission medications   Medication Sig Start Date End Date Taking? Authorizing Provider  amLODipine (NORVASC) 5 MG tablet Take 5 mg by mouth daily. 03/17/21   [provider]  atorvastatin (LIPITOR) 40 MG tablet Take 40 mg by mouth daily. 03/17/21   [provider]  clopidogrel (PLAVIX) 75 MG tablet Take 75 mg by mouth daily. 03/17/21   [provider]  diclofenac Sodium (VOLTAREN) 1 % GEL SMARTSIG:Gram(s) Topical Twice Daily PRN 07/20/21   [provider]  empagliflozin (JARDIANCE) 25 MG TABS tablet Take 25 mg by mouth daily.    [provider]  finasteride (PROSCAR) 5 MG tablet Take 5 mg by mouth daily. 03/17/21   [provider]  gabapentin (NEURONTIN) 300 MG capsule Take 300 mg by mouth. 07/20/21   [provider]  insulin degludec (TRESIBA FLEXTOUCH) 200 UNIT/ML FlexTouch Pen Inject  80 Units into the skin daily.    [provider]  metoprolol tartrate (LOPRESSOR) 25 MG tablet Take 25 mg by mouth 2 (two) times daily. 03/17/21   [provider]  oxyCODONE (OXY IR/ROXICODONE) 5 MG immediate release tablet Take by mouth. 07/20/21   [provider]  potassium chloride SA (KLOR-CON) 20 MEQ tablet Take 20 mEq by mouth daily. 03/17/21   [provider]   Semaglutide, 1 MG/DOSE, (OZEMPIC, 1 MG/DOSE,) 4 MG/3ML SOPN Inject 1 mg into the skin once a week.    [provider]  traZODone (DESYREL) 50 MG tablet Take 50-150 mg by mouth at bedtime as needed. 03/17/21   [provider]    Physical Exam: Vitals:   07/27/21 2215 07/27/21 2236 07/28/21 0100 07/28/21 0129  BP: 122/69 139/85 (!) 144/91   Pulse:  100 98   Resp: 20 13 14    Temp:    99 F (37.2 C)  TempSrc:    Oral  SpO2:  95% 94%   Weight:        Constitutional: Lethargic but arousable, oriented x3, no associated distress.   Skin: Notable ecchymosis along the right anterior abdominal wall.  Somewhat poor skin turgor noted.  No other rashes or lesions seen.   Eyes: Pupils are equally reactive to light.  No evidence of scleral icterus or conjunctival pallor.  ENMT: Slightly dry mucous membranes noted.  Posterior pharynx clear of any exudate or lesions.   Neck: normal, supple, no masses, no thyromegaly.  No evidence of jugular venous distension.   Respiratory: clear to auscultation bilaterally, no wheezing, no crackles. Normal respiratory effort. No accessory muscle use.  Cardiovascular: Regular rate and rhythm, no murmurs / rubs / gallops. No extremity edema. 2+ pedal pulses. No carotid bruits.  Chest:   Nontender without crepitus or deformity.   Back:   Nontender without crepitus or deformity. Abdomen: Some notable tenderness around the right lower quadrant and suprapubic regions.  Abdomen is soft however.  No evidence of intra-abdominal masses.  Positive bowel sounds noted in all quadrants.   Musculoskeletal: Status post left BKA.  Surgical wound of the left lower extremity stump noted.  Good ROM, no contractures. Normal muscle tone.  Neurologic: Patient is quite lethargic but arousable and intermittently follows commands.  Patient is oriented.  Patient moves all 4 extremities spontaneously.  Sensation seems to be grossly intact.  Patient has notable difficulty with  naming objects presented to him.   Psychiatric: Unable to assess due to substantial lethargy.  Patient currently does not seem to possess insight as to his current situation.   Labs on Admission: I have personally reviewed following labs and imaging studies -   CBC: Recent Labs  Lab 07/27/21 1507  WBC 12.5*  NEUTROABS 9.9*  HGB 17.0  HCT 50.8  MCV 97.7  PLT 779*   Basic Metabolic Panel: Recent Labs  Lab 07/27/21 1507  NA 138  K 4.0  CL 108  CO2 20*  GLUCOSE 51*  BUN 21  CREATININE 0.83  CALCIUM 9.1   GFR: CrCl cannot be calculated (Unknown ideal weight.). Liver Function Tests: Recent Labs  Lab 07/27/21 1507  AST 58*  ALT 29  ALKPHOS 82  BILITOT 1.9*  PROT 6.3*  ALBUMIN 3.6   No results for input(s): LIPASE, AMYLASE in the last 168 hours. No results for input(s): AMMONIA in the last 168 hours. Coagulation Profile: Recent Labs  Lab 07/27/21 1507  INR 1.0   Cardiac Enzymes: No  results for input(s): CKTOTAL, CKMB, CKMBINDEX, TROPONINI in the last 168 hours. BNP (last 3 results) No results for input(s): PROBNP in the last 8760 hours. HbA1C: No results for input(s): HGBA1C in the last 72 hours. CBG: Recent Labs  Lab 07/27/21 1512 07/27/21 1803 07/28/21 0149  GLUCAP 74 99 104*   Lipid Profile: No results for input(s): CHOL, HDL, LDLCALC, TRIG, CHOLHDL, LDLDIRECT in the last 72 hours. Thyroid Function Tests: No results for input(s): TSH, T4TOTAL, FREET4, T3FREE, THYROIDAB in the last 72 hours. Anemia Panel: No results for input(s): VITAMINB12, FOLATE, FERRITIN, TIBC, IRON, RETICCTPCT in the last 72 hours. Urine analysis:    Component Value Date/Time   COLORURINE YELLOW 07/27/2021 1507   APPEARANCEUR CLEAR 07/27/2021 1507   LABSPEC 1.017 07/27/2021 1507   PHURINE 5.0 07/27/2021 1507   GLUCOSEU 150 (A) 07/27/2021 1507   HGBUR MODERATE (A) 07/27/2021 1507   BILIRUBINUR NEGATIVE 07/27/2021 1507   KETONESUR 20 (A) 07/27/2021 1507   PROTEINUR 30 (A)  07/27/2021 1507   NITRITE NEGATIVE 07/27/2021 1507   LEUKOCYTESUR SMALL (A) 07/27/2021 1507    Radiological Exams on Admission - Personally Reviewed: CT Angio Head W or Wo Contrast  Result Date: 07/28/2021 CLINICAL DATA:  Initial evaluation for acute subarachnoid hemorrhage. Head trauma. EXAM: CT ANGIOGRAPHY HEAD TECHNIQUE: Multidetector CT imaging of the head was performed using the standard protocol during bolus administration of intravenous contrast. Multiplanar CT image reconstructions and MIPs were obtained to evaluate the vascular anatomy. CONTRAST:  31mL OMNIPAQUE IOHEXOL 350 MG/ML SOLN COMPARISON:  Prior head CT from earlier the same day. FINDINGS: CTA HEAD Anterior circulation: Visualized distal cervical segments of the internal carotid arteries are mildly irregular but patent without stenosis. Atheromatous change within the horizontal petrous segments without high-grade stenosis. Extensive plaque within the carotid siphons with associated moderate diffuse narrowing, right slightly worse than left. A1 segments patent bilaterally. Normal anterior communicating artery complex. Anterior cerebral arteries patent to their distal aspects without stenosis. Left M1 segment mildly irregular but patent. Normal left MCA bifurcation. Distal left MCA branches well perfused. There is occlusion of the right MCA at its origin, presumably chronic. Scant attenuated flow within right MCA branches distally likely collateral. Posterior circulation: Atheromatous change seen about the V4 segments bilaterally with associated up to moderate stenoses. There is a more focal moderate to severe stenosis involving the proximal left V4 segment (series 7, image 161). Neither PICA origin well visualized on this exam. Basilar irregular but patent to its distal aspect without stenosis. Superior cerebral arteries are patent bilaterally. Both PCAs primarily supplied via the basilar. PCAs mildly irregular but are patent to their distal  aspects without stenosis. Venous sinuses: Not well assessed due to timing the contrast bolus. Anatomic variants: None significant. No aneurysm or other vascular abnormality seen underlying the right sylvian subarachnoid hemorrhage. Review of the MIP images confirms the above findings. IMPRESSION: 1. No aneurysm or other vascular abnormality seen underlying the right Sylvian subarachnoid hemorrhage. 2. 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 Scant attenuated flow within right MCA branches distally, likely collateral. 3. Atherosclerotic change within the carotid siphons with associated moderate diffuse narrowing, right slightly worse than left. 4. Atheromatous change about the V4 segments bilaterally with associated up to moderate stenoses. Case discussed by telephone at the time of interpretation on 07/28/2021 at 12:31 am with the hospitalist, Dr. Cyd Silence, who verbally acknowledged these results. Electronically Signed   By: Jeannine Boga M.D.   On: 07/28/2021 00:37  CT HEAD WO CONTRAST (5MM)  Addendum Date: 07/27/2021   ADDENDUM REPORT: 07/27/2021 22:08 ADDENDUM: These results were called by telephone at the time of interpretation on 07/27/2021 at 10:05 pm to provider [facility] , who verbally acknowledged these results. Electronically Signed   By: Iven Finn M.D.   On: 07/27/2021 22:08   Result Date: 07/27/2021 CLINICAL DATA:  Delirium. EXAM: CT HEAD WITHOUT CONTRAST TECHNIQUE: Contiguous axial images were obtained from the base of the skull through the vertex without intravenous contrast. COMPARISON:  None. BRAIN: BRAIN Cerebral ventricle sizes are concordant with the degree of cerebral volume loss. Patchy and confluent areas of decreased attenuation are noted throughout the deep and periventricular white matter of the cerebral hemispheres bilaterally, compatible with chronic microvascular ischemic disease. No evidence of large-territorial acute infarction. No  parenchymal hemorrhage. No mass lesion. Extra-axial hyperdense fluid within the right sylvian fissure. No mass effect or midline shift. No hydrocephalus. Basilar cisterns are patent. Vascular: No hyperdense vessel. Atherosclerotic calcifications are present within the cavernous internal carotid and vertebral arteries. Skull: No acute fracture or focal lesion. Sinuses/Orbits: Paranasal sinuses and mastoid air cells are clear. The orbits are unremarkable. Other: None. IMPRESSION: Acute right sylvian fissure subarachnoid hemorrhage. Electronically Signed: By: Iven Finn M.D. On: 07/27/2021 20:37   DG Chest Port 1 View  Result Date: 07/27/2021 CLINICAL DATA:  Questionable sepsis, evaluate for abnormality. EXAM: PORTABLE CHEST 1 VIEW COMPARISON:  Chest radiograph 12/12/2020 FINDINGS: Of note, the patient is a rotated on this portable examination. [name] chin overlies the left apex. The heart size and mediastinal contours are within normal limits, allowing for differences in patient positioning. Aortic calcification. Both lungs are clear. No pleural effusion or pneumothorax. The visualized skeletal structures are unremarkable. IMPRESSION: No active disease. Electronically Signed   By: Ileana Roup M.D.   On: 07/27/2021 15:51    EKG: Personally reviewed.  Rhythm is normal sinus rhythm with heart rate of 97 bpm.  Notable prolonged QTC of 517.  no dynamic ST segment changes appreciated.  Assessment/Plan  * Acute metabolic encephalopathy Patient presenting via EMS with confusion after being found down in his apartment by the sheriff  no evidence of focal neurologic deficit on exam  Confusion is likely multifactorial secondary to subarachnoid hemorrhage, intermittent hypoglycemia, underlying infectious process and ongoing polypharmacy the patient being on multiple sedating agents in the outpatient setting. Gentle intravenous hydration Treating suspected urinary tract infection with intravenous  ceftriaxone Serial neurologic checks Obtaining MRI brain as well as EEG Obtaining RPR, TSH, vitamin B12 Neurosurgery to evaluate in the morning  Acute cystitis without hematuria Urinalysis positive for leukocyte Estrace with moderate amounts of white blood cells per high-powered field We will treat for suspected urinary tract infection considering patient is presenting with multiple SIRS criteria including fever tachycardia and leukocytosis without an alternative source of potential infection  treating patient with intravenous ceftriaxone Blood and urine cultures pending Gentle intravenous hydration Underlying infection may have precipitated the patient's traumatic fall.  Subarachnoid hemorrhage (HCC) Identified on initial noncontrast CT imaging of the brain ER provider discussed case with Dr. [name] who is planning on evaluating the patient in consultation Performing serial neurologic checks every 4 hours On Keppra for seizure prophylaxis per neurosurgery recommendation Target systolic blood pressures of less than 140 with as needed intravenous labetalol Avoiding anticoagulants Further surveillance CT imaging based on neurosurgery recommendations in the morning.  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 Cheral Marker with neurology Dr. Cheral Marker with neurology recommending obtaining MRI brain to ensure there is no evidence of stroke Will proceed with consulting neurology if EEG or stroke reveal abnormalities  Prolonged QT interval QTC noted to be 517 on EKG Monitoring patient on telemetry Ensuring electrolytes are corrected Minimizing use of QT prolonging agents  SIRS (systemic inflammatory response syndrome) ([place]) Patient presenting with multiple surgical Teare including fever tachycardia and leukocytosis Not enough evidence of organ dysfunction to be considered  sepsis (qSOFA score 1) Treatment of suspected urinary tract infection as noted above Intravenous hydration and surveillance cultures  Diabetic polyneuropathy associated with type 2 diabetes mellitus ([place]) Holding gabapentin for now until mentation improves  Mixed diabetic hyperlipidemia associated with type 2 diabetes mellitus ([place]) Continuing home regimen of lipid lowering therapy.   Essential hypertension Continue home regimen of antihypertensive therapy Target systolic blood pressure of around 140 considering subarachnoid hemorrhage.  Uncontrolled type 2 diabetes mellitus with hypoglycemia without coma, with long-term current use of insulin ([place]) Patient found to have hypoglycemia on arrival likely secondary to poor oral intake in setting of confusion and ongoing insulin use Holding home regimen of hypoglycemics and insulin Performing Accu-Cheks every 4 hours for now, will transition to before every meal nightly as blood sugars normalized If hypoglycemia persists will initiate dextrose infusion Hemoglobin A1c ordered  BPH with obstruction/lower urinary tract symptoms Continue home regimen of Proscar      Code Status:  Full code  code status decision has been confirmed with: patient Family Communication: No listed contact information for family or next of kin.  Patient is declining to provide me with any contact information and states he does not wish for anyone to be called.  Status is: Inpatient  Remains inpatient appropriate because:Altered mental status, Ongoing diagnostic testing needed not appropriate for outpatient work up, IV treatments appropriate due to intensity of illness or inability to take PO, and Inpatient level of care appropriate due to severity of illness  Dispo: The patient is from: Home              Anticipated d/c is to: SNF              Patient currently is not medically stable to d/c.   Difficult to place patient Yes        Vernelle Emerald  MD [facility] Pager [phone]  If 7PM-7AM, please contact night-coverage [email or web address] Use universal [place] password for that web site. If you do not have the password, please call the hospital operator.  07/28/2021, 2:01 AM

## 2021-07-28 NOTE — Assessment & Plan Note (Signed)
   Patient presenting with multiple surgical Teare including fever tachycardia and leukocytosis  Not enough evidence of organ dysfunction to be considered sepsis (qSOFA score 1)  Treatment of suspected urinary tract infection as noted above  Intravenous hydration and surveillance cultures

## 2021-07-28 NOTE — ED Notes (Signed)
Monico Hoar [phone] (Mother) would like updates

## 2021-07-28 NOTE — Assessment & Plan Note (Signed)
   QTC noted to be 517 on EKG  Monitoring patient on telemetry  Ensuring electrolytes are corrected  Minimizing use of QT prolonging agents

## 2021-07-28 NOTE — Consult Note (Signed)
   Providing Compassionate, Quality Care - Together  Neurosurgery Consult  Referring physician: Dr. Cyd Silence Reason for referral: Possible subarachnoid hemorrhage  Chief Complaint: Altered mental status  History of Present Illness: This is a 65 year old male who presented with altered mental status.  He has a history of left BKA, hypertension, diabetes, diabetic polyneuropathy.  He was found down in his apartment and was brought to the emergency department.  CT of the brain revealed possible right sylvian subarachnoid hemorrhage.  CTA was performed, showed no vascular abnormality, chronic M1 occlusion on the right.  Patient states at this time he feels fine.  Denies headache, denies blurry vision.  Denies numbness or tingling.  Denies thunderclap headache.  Denies any family history of aneurysms   Medications: I have reviewed the patient's current medications. Allergies: No Known Allergies  History reviewed. No pertinent family history. Social History:  has no history on file for tobacco use, alcohol use, and drug use.  ROS: All positives are listed in HPI above  Physical Exam:  Vital signs in last 24 hours: Temp:  [98 F (36.7 C)-98.3 F (36.8 C)] 98 F (36.7 C) (07/25 1814) Pulse Rate:  [58-128] 65 (07/26 0746) Resp:  [11-18] 14 (07/26 0217) BP: (138-182)/(65-125) 153/88 (07/26 0700) SpO2:  [91 %-98 %] 96 % (07/26 0746) PE: Awake alert, oriented x2 PERRLA EOMI Cranial nerves II through XII intact Left BKA Sensory intact light touch throughout Moves all extremities equally   Impression/Assessment:  65 year old male with  Altered mental status/encephalopathy Questionable subarachnoid hemorrhage/hyperdensity on CT  Plan:  CTA reviewed.  No vascular abnormality, chronic M1 occlusion.  I do not recommend any surgical intervention.  I question the etiology of the hyperdensity on the CT scan, likely not subarachnoid hemorrhage.  MRI pending.   Thank you for allowing me  to participate in this patient's care.  Please do not hesitate to call with questions or concerns.   Elwin Sleight, [facility] Cell: [phone]

## 2021-07-28 NOTE — Assessment & Plan Note (Signed)
.   Continuing home regimen of lipid lowering therapy.  

## 2021-07-28 NOTE — Procedures (Signed)
Patient Name: Peter Yang  MRN: [id number]  Epilepsy Attending: Lora Havens  Referring Physician/Provider: Dr Inda Merlin Date: 07/29/2021 Duration: 25.14 mins  Patient history: 65yo m with ams. CTH showed acute right sylvian fissure subarachnoid hemorrhage. EEG to evaluate for seizure  Level of alertness: Awake, asleep  AEDs during EEG study: LEV  Technical aspects: This EEG study was done with scalp electrodes positioned according to the 10-20 International system of electrode placement. Electrical activity was acquired at a sampling rate of 500Hz  and reviewed with a high frequency filter of 70Hz  and a low frequency filter of 1Hz . EEG data were recorded continuously and digitally stored.   Description: The posterior dominant rhythm consists of 8-9 Hz activity of moderate voltage (25-35 uV) seen predominantly in posterior head regions, symmetric and reactive to eye opening and eye closing. Sleep was characterized by vertex waves, sleep spindles (12 to 14 Hz), maximal frontocentral region. EEG showed intermittent generalized and maximal right temporal 3 to 6 Hz theta-delta slowing. Hyperventilation and photic stimulation were not performed.     ABNORMALITY - Intermittent slow, generalized and maximal right temporal   IMPRESSION: This study is suggestive of cortical dysfunction arising from right temporal region, non specific etiology but could be secondary to underlying sub-arachnoid hemorrhage. Additionally there is mild diffuse encephalopathy, nonspecific etiology. No seizures or epileptiform discharges were seen throughout the recording.  Shalicia Craghead Barbra Sarks

## 2021-07-28 NOTE — Assessment & Plan Note (Addendum)
   Patient presenting via EMS with confusion after being found down in his apartment by the sheriff   no evidence of focal neurologic deficit on exam   Confusion is likely multifactorial secondary to subarachnoid hemorrhage, intermittent hypoglycemia, underlying infectious process and ongoing polypharmacy the patient being on multiple sedating agents in the outpatient setting.  Gentle intravenous hydration  Treating suspected urinary tract infection with intravenous ceftriaxone  Serial neurologic checks  Obtaining MRI brain as well as EEG  Obtaining RPR, TSH, vitamin B12  Neurosurgery to evaluate in the morning

## 2021-07-28 NOTE — Assessment & Plan Note (Signed)
   Holding gabapentin for now until mentation improves

## 2021-07-28 NOTE — Progress Notes (Signed)
Patient admitted to Rm 3W05 from ED after having been found unresponsive by police officer in his home.  Patient states he does not remember this and Does not understand why he is in the hospital.  Pt has had a recent left BKA and is currently wearing a knee immobilizer.  He states he has a post-op appointment with the surgeon soon.  Patient refused to have nurse assess skin and stated only wanted to go home.  Call light placed within reach and bed placed in low position.  Pt has apparently refused several tests in ED (EEG, etc.).  Introduced to staff and unit routine.  Supper ordered with assistance from staff.

## 2021-07-28 NOTE — Progress Notes (Signed)
PROGRESS NOTE    Peter Yang  JOA:[id number] DOB: 1956-04-17 DOA: 07/27/2021 PCP: [name], MD  Outpatient Specialists:   Brief Narrative:  Patient was admitted earlier today.  As per H&P done on admission by Dr. Smitty Cords: "65 year old male with past medical history of recent left BKA, hypertension, diabetes mellitus type 2, diabetic polyneuropathy, BPH who presents to [facility] emergency department via EMS after being found down in his apartment with associated confusion.   Of note, patient was recently hospitalized at [facility] from on 9/8 after which he had a left BKA for necrotizing left foot infection.  Patient was eventually discharged on 9/14 to rehab and was discharged from rehab back home on 9/30.   According to EMS, patient lives at home by himself.  Per my discussion with ED staff, they were informed by EMS that they had also responded the patient's home on 10/7 and during that visit patient was found to be hypoglycemic but declined transfer to the hospital.  On 10/7, EMS was called back to the home after the sheriff found the patient down on the floor and confused.  Patient was then promptly brought into [facility] emergency room for evaluation.  Upon asking the patient the circumstances of how he ended up on the ground he simply says "I don't remember."     Upon evaluation in the emergency department noncontrast CT imaging of the head revealed evidence of an acute right sylvian fissure subarachnoid hemorrhage.  ER provider discussed case with Dr. [name] with neurosurgery who recommended obtaining CT angiogram of the head to evaluate for any evidence of aneurysm.  They also recommended loading patient with Keppra.  The hospitalist group was then called to assess patient for admission to the hospital"  07/28/2021: Patient seen.  Patient has not a particularly good historian.  Patient agrees that he only slipped out of the bed.  Patient also denied  hitting his head.  Despite having intracerebral bleed, patient could not or would not explain how the bleed came about.  Patient was also refusing MRI brain and EEG earlier.  Patient is not a particularly good historian.  Patient has not also very cooperative.  We will check urine drug screening.  Ability to live independently at home will need to be assessed prior to discharge.  Significantly, patient's glucose was 51 at some point.  Prior to admission, patient was on semaglutide 1 Mg every week and empagliflozin.  UA was negative for bacteria, specific gravity was 1.017, moderate hemoglobin, small leukocyte esterase and ketone of 20.  Potassium this morning was 3.1 with magnesium of 1.6.   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 Gentle intravenous hydration Treating suspected urinary tract infection with intravenous ceftriaxone Serial neurologic checks Obtaining MRI brain  as well as EEG Obtaining RPR, TSH, vitamin B12 Neurosurgery to evaluate.   Acute cystitis without hematuria Doubt UTI. -Follow cultures..   Subarachnoid hemorrhage ([place]) Identified on initial noncontrast CT imaging of the brain ER provider discussed case with Dr. [name] who is planning on evaluating the patient in consultation Performing serial neurologic checks every 4 hours On Keppra for seizure prophylaxis per neurosurgery recommendation Target systolic blood pressures of less than 140 with as needed intravenous labetalol Avoiding anticoagulants Repeat CT angio head with or without contrast result noted.     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 Cheral Marker with neurology Dr. Cheral Marker with neurology recommending obtaining MRI brain to ensure there is no evidence of stroke Will proceed with consulting neurology if EEG or stroke reveal abnormalities   Prolonged QT interval QTC noted to be 517 on EKG Monitoring patient on telemetry Monitor and correct electrolytes. Repeat EKG. Continue to avoid QTC monitoring H&H.    Diabetic polyneuropathy associated with type 2 diabetes mellitus (HCC) Holding gabapentin for now until mentation improves   Mixed diabetic hyperlipidemia associated with type 2 diabetes mellitus ([place]) Continuing home regimen of lipid lowering therapy.     Essential hypertension Continue home regimen of antihypertensive therapy Target systolic blood pressure of around 140 considering subarachnoid hemorrhage.   Uncontrolled type 2 diabetes mellitus with hypoglycemia without coma, with long-term current use of insulin ([place]) Patient found to have hypoglycemia on arrival likely secondary to poor oral intake in setting of confusion and ongoing insulin use Holding home regimen of hypoglycemics and insulin Performing Accu-Cheks every 4 hours  for now, will transition to before every meal nightly as blood sugars normalized If hypoglycemia persists will initiate dextrose infusion Hemoglobin A1c ordered   BPH with obstruction/lower urinary tract symptoms Continue home regimen of Proscar     DVT prophylaxis: SCD Code Status: Full code Family Communication:  Disposition Plan: This will depend on hospital course.   Consultants:  Neurosurgery  Procedures:  None  Antimicrobials:  Presentation to the ER, patient received IV cefepime, Flagyl and vancomycin.  Patient is not currently on any antibiotics.   Subjective: Poor historian. Not in any distress.  Objective: Vitals:   07/28/21 1000 07/28/21 1330 07/28/21 1500 07/28/21 1600  BP: 122/73 128/69 114/79 117/68  Pulse: 77 72  60  Resp: 14 (!) 21 15 13   Temp:      TempSrc:      SpO2: 97% 96%  98%  Weight:      Height:        Intake/Output Summary (Last 24 hours) at 07/28/2021 1726 Last data filed at 07/28/2021 0130 Gross per 24 hour  Intake 100 ml  Output 1000 ml  Net -900 ml   Filed Weights   07/27/21 1616 07/28/21 0416  Weight: 91.8 kg 94.3 kg    Examination:  General exam: Appears calm and comfortable.  However, not very cooperative.  Patient is status post left BKA. Respiratory system: Decreased air entry globally.   Cardiovascular system: S1 & S2 Gastrointestinal system: Abdomen is nondistended, soft and nontender. No organomegaly or masses felt. Normal bowel sounds heard. Central nervous system: Awake and alert.  Moves all extremities.   Extremities:  S/p left BKA.  No edema of the right lower extremity.  Data Reviewed: I have personally reviewed following labs and imaging studies  CBC: Recent Labs  Lab 07/27/21 1507 07/28/21 0334  WBC 12.5* 8.3  NEUTROABS 9.9* 4.9  HGB 17.0 14.6  HCT 50.8 43.2  MCV 97.7 93.7  PLT 121* 401   Basic Metabolic Panel: Recent Labs  Lab 07/27/21 1507 07/28/21 0334  NA 138 137  K 4.0 3.1*  CL 108 106   CO2 20* 20*  GLUCOSE 51* 83  BUN 21 18  CREATININE 0.83 0.83  CALCIUM 9.1 8.7*  MG  --  1.6*   GFR: Estimated Creatinine Clearance: 110.4 mL/min (by C-G formula based on SCr of 0.83 mg/dL). Liver Function Tests: Recent Labs  Lab 07/27/21 1507 07/28/21 0334  AST 58* 44*  ALT 29 26  ALKPHOS 82 69  BILITOT 1.9* 2.1*  PROT 6.3* 5.8*  ALBUMIN 3.6 3.2*   No results for input(s): LIPASE, AMYLASE in the last 168 hours. No results for input(s): AMMONIA in the last 168 hours. Coagulation Profile: Recent Labs  Lab 07/27/21 1507 07/28/21 0334  INR 1.0 1.2   Cardiac Enzymes: Recent Labs  Lab 07/28/21 0334  CKTOTAL 497*   BNP (last 3 results) No results for input(s): PROBNP in the last 8760 hours. HbA1C: Recent Labs    07/28/21 0334  HGBA1C 4.7*   CBG: Recent Labs  Lab 07/28/21 0149 07/28/21 0409 07/28/21 0821 07/28/21 1331 07/28/21 1700  GLUCAP 104* 84 94 135* 103*   Lipid Profile: No results for input(s): CHOL, HDL, LDLCALC, TRIG, CHOLHDL, LDLDIRECT in the last 72 hours. Thyroid Function Tests: Recent Labs    07/28/21 0334  TSH 2.045   Anemia Panel: Recent Labs    07/28/21 0334  VITAMINB12 364   Urine analysis:    Component Value Date/Time   COLORURINE YELLOW 07/27/2021 1507   APPEARANCEUR CLEAR 07/27/2021 1507   LABSPEC 1.017 07/27/2021 1507   PHURINE 5.0 07/27/2021 1507   GLUCOSEU 150 (A) 07/27/2021 1507   HGBUR MODERATE (A) 07/27/2021 1507   BILIRUBINUR NEGATIVE 07/27/2021 1507   KETONESUR 20 (A) 07/27/2021 1507   PROTEINUR 30 (A) 07/27/2021 1507   NITRITE NEGATIVE 07/27/2021 1507   LEUKOCYTESUR SMALL (A) 07/27/2021 1507   Sepsis Labs: @LABRCNTIP (procalcitonin:4,lacticidven:4)  ) Recent Results (from the past 240 hour(s))  Blood Culture (routine x 2)     Status: None (Preliminary result)   Collection Time: 07/27/21  3:07 PM   Specimen: BLOOD RIGHT WRIST  Result Value Ref Range Status   Specimen Description BLOOD RIGHT WRIST  Final    Special Requests   Final    BOTTLES DRAWN AEROBIC AND ANAEROBIC Blood Culture results may not be optimal due to an inadequate volume of blood received in culture bottles   Culture   Final    NO GROWTH < 24 HOURS Performed at [facility], [place] [address], [place], [place] [address]    Report Status PENDING  Incomplete  Resp Panel by RT-PCR (Flu A&B, Covid) Nasopharyngeal Swab     Status: None   Collection Time: 07/27/21  3:15 PM   Specimen: Nasopharyngeal Swab; Nasopharyngeal(NP) swabs in vial transport medium  Result Value Ref Range Status   SARS Coronavirus 2 by RT PCR NEGATIVE NEGATIVE Final    Comment: (NOTE) SARS-CoV-2 target nucleic acids are NOT DETECTED.  The SARS-CoV-2 RNA is generally detectable in upper respiratory specimens during the acute phase of infection. The lowest concentration of  SARS-CoV-2 viral copies this assay can detect is 138 copies/mL. A negative result does not preclude SARS-Cov-2 infection and should not be used as the sole basis for treatment or other patient management decisions. A negative result may occur with  improper specimen collection/handling, submission of specimen other than nasopharyngeal swab, presence of viral mutation(s) within the areas targeted by this assay, and inadequate number of viral copies(<138 copies/mL). A negative result must be combined with clinical observations, patient history, and epidemiological information. The expected result is Negative.  Fact Sheet for Patients:  EntrepreneurPulse.com.au  Fact Sheet for Healthcare Providers:  IncredibleEmployment.be  This test is no t yet approved or cleared by the [place] FDA and  has been authorized for detection and/or diagnosis of SARS-CoV-2 by FDA under an Emergency Use Authorization (EUA). This EUA will remain  in effect (meaning this test can be used) for the duration of the COVID-19 declaration under Section 564(b)(1) of  the Act, 21 U.S.C.section 360bbb-3(b)(1), unless the authorization is terminated  or revoked sooner.       Influenza A by PCR NEGATIVE NEGATIVE Final   Influenza B by PCR NEGATIVE NEGATIVE Final    Comment: (NOTE) The Xpert Xpress SARS-CoV-2/FLU/RSV plus assay is intended as an aid in the diagnosis of influenza from Nasopharyngeal swab specimens and should not be used as a sole basis for treatment. Nasal washings and aspirates are unacceptable for Xpert Xpress SARS-CoV-2/FLU/RSV testing.  Fact Sheet for Patients: EntrepreneurPulse.com.au  Fact Sheet for Healthcare Providers: IncredibleEmployment.be  This test is not yet approved or cleared by the [place] FDA and has been authorized for detection and/or diagnosis of SARS-CoV-2 by FDA under an Emergency Use Authorization (EUA). This EUA will remain in effect (meaning this test can be used) for the duration of the COVID-19 declaration under Section 564(b)(1) of the Act, 21 U.S.C. section 360bbb-3(b)(1), unless the authorization is terminated or revoked.  Performed at [facility], [place] [address], [place], [place] [address]   Urine Culture     Status: Abnormal   Collection Time: 07/27/21  3:34 PM   Specimen: In/Out Cath Urine  Result Value Ref Range Status   Specimen Description IN/OUT CATH URINE  Final   Special Requests   Final    NONE Performed at [facility], [place] [address], [place], [place] [address]    Culture MULTIPLE SPECIES PRESENT, SUGGEST RECOLLECTION (A)  Final   Report Status 07/28/2021 FINAL  Final  Blood Culture (routine x 2)     Status: None (Preliminary result)   Collection Time: 07/27/21  3:35 PM   Specimen: BLOOD  Result Value Ref Range Status   Specimen Description BLOOD RIGHT ANTECUBITAL  Final   Special Requests   Final    BOTTLES DRAWN AEROBIC AND ANAEROBIC Blood Culture results may not be optimal due to an inadequate volume of blood received in  culture bottles   Culture   Final    NO GROWTH < 24 HOURS Performed at [facility], [place] [address], [place], [place] [address]    Report Status PENDING  Incomplete         Radiology Studies: CT Angio Head W or Wo Contrast  Result Date: 07/28/2021 CLINICAL DATA:  Initial evaluation for acute subarachnoid hemorrhage. Head trauma. EXAM: CT ANGIOGRAPHY HEAD TECHNIQUE: Multidetector CT imaging of the head was performed using the standard protocol during bolus administration of intravenous contrast. Multiplanar CT image reconstructions and MIPs were obtained to evaluate the vascular  anatomy. CONTRAST:  5mL OMNIPAQUE IOHEXOL 350 MG/ML SOLN COMPARISON:  Prior head CT from earlier the same day. FINDINGS: CTA HEAD Anterior circulation: Visualized distal cervical segments of the internal carotid arteries are mildly irregular but patent without stenosis. Atheromatous change within the horizontal petrous segments without high-grade stenosis. Extensive plaque within the carotid siphons with associated moderate diffuse narrowing, right slightly worse than left. A1 segments patent bilaterally. Normal anterior communicating artery complex. Anterior cerebral arteries patent to their distal aspects without stenosis. Left M1 segment mildly irregular but patent. Normal left MCA bifurcation. Distal left MCA branches well perfused. There is occlusion of the right MCA at its origin, presumably chronic. Scant attenuated flow within right MCA branches distally likely collateral. Posterior circulation: Atheromatous change seen about the V4 segments bilaterally with associated up to moderate stenoses. There is a more focal moderate to severe stenosis involving the proximal left V4 segment (series 7, image 161). Neither PICA origin well visualized on this exam. Basilar irregular but patent to its distal aspect without stenosis. Superior cerebral arteries are patent bilaterally. Both PCAs primarily supplied via the  basilar. PCAs mildly irregular but are patent to their distal aspects without stenosis. Venous sinuses: Not well assessed due to timing the contrast bolus. Anatomic variants: None significant. No aneurysm or other vascular abnormality seen underlying the right sylvian subarachnoid hemorrhage. Review of the MIP images confirms the above findings. IMPRESSION: 1. No aneurysm or other vascular abnormality seen underlying the right Sylvian subarachnoid hemorrhage. 2. 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 Scant attenuated flow within right MCA branches distally, likely collateral. 3. Atherosclerotic change within the carotid siphons with associated moderate diffuse narrowing, right slightly worse than left. 4. Atheromatous change about the V4 segments bilaterally with associated up to moderate stenoses. Case discussed by telephone at the time of interpretation on 07/28/2021 at 12:31 am with the hospitalist, Dr. Cyd Silence, who verbally acknowledged these results. Electronically Signed   By: Jeannine Boga M.D.   On: 07/28/2021 00:37   CT HEAD WO CONTRAST (5MM)  Addendum Date: 07/27/2021   ADDENDUM REPORT: 07/27/2021 22:08 ADDENDUM: These results were called by telephone at the time of interpretation on 07/27/2021 at 10:05 pm to provider [facility] , who verbally acknowledged these results. Electronically Signed   By: Iven Finn M.D.   On: 07/27/2021 22:08   Result Date: 07/27/2021 CLINICAL DATA:  Delirium. EXAM: CT HEAD WITHOUT CONTRAST TECHNIQUE: Contiguous axial images were obtained from the base of the skull through the vertex without intravenous contrast. COMPARISON:  None. BRAIN: BRAIN Cerebral ventricle sizes are concordant with the degree of cerebral volume loss. Patchy and confluent areas of decreased attenuation are noted throughout the deep and periventricular white matter of the cerebral hemispheres bilaterally, compatible with chronic microvascular ischemic disease. No  evidence of large-territorial acute infarction. No parenchymal hemorrhage. No mass lesion. Extra-axial hyperdense fluid within the right sylvian fissure. No mass effect or midline shift. No hydrocephalus. Basilar cisterns are patent. Vascular: No hyperdense vessel. Atherosclerotic calcifications are present within the cavernous internal carotid and vertebral arteries. Skull: No acute fracture or focal lesion. Sinuses/Orbits: Paranasal sinuses and mastoid air cells are clear. The orbits are unremarkable. Other: None. IMPRESSION: Acute right sylvian fissure subarachnoid hemorrhage. Electronically Signed: By: Iven Finn M.D. On: 07/27/2021 20:37   DG Chest Port 1 View  Result Date: 07/27/2021 CLINICAL DATA:  Questionable sepsis, evaluate for abnormality. EXAM: PORTABLE CHEST 1 VIEW COMPARISON:  Chest radiograph 12/12/2020 FINDINGS: Of note, the  patient is a rotated on this portable examination. [name] chin overlies the left apex. The heart size and mediastinal contours are within normal limits, allowing for differences in patient positioning. Aortic calcification. Both lungs are clear. No pleural effusion or pneumothorax. The visualized skeletal structures are unremarkable. IMPRESSION: No active disease. Electronically Signed   By: Ileana Roup M.D.   On: 07/27/2021 15:51        Scheduled Meds:  amLODipine  5 mg Oral Daily   atorvastatin  40 mg Oral Daily   finasteride  5 mg Oral Daily   insulin aspart  0-15 Units Subcutaneous Q4H   levETIRAcetam  500 mg Oral BID   metoprolol tartrate  25 mg Oral BID   Continuous Infusions:  cefTRIAXone (ROCEPHIN)  IV Stopped (07/28/21 1122)     LOS: 1 day    Time spent: 35 minutes    Dana Allan, MD  [facility] Pager #: [phone] 7PM-7AM contact night coverage as above

## 2021-07-28 NOTE — Assessment & Plan Note (Signed)
   Continue home regimen of Proscar

## 2021-07-28 NOTE — ED Notes (Signed)
Pt refusing EEG

## 2021-07-29 ENCOUNTER — Inpatient Hospital Stay (HOSPITAL_COMMUNITY): Payer: BC Managed Care – PPO

## 2021-07-29 DIAGNOSIS — I10 Essential (primary) hypertension: Secondary | ICD-10-CM | POA: Diagnosis not present

## 2021-07-29 LAB — GLUCOSE, CAPILLARY
Glucose-Capillary: 218 mg/dL — ABNORMAL HIGH (ref 70–99)
Glucose-Capillary: 140 mg/dL — ABNORMAL HIGH (ref 70–99)
Glucose-Capillary: 177 mg/dL — ABNORMAL HIGH (ref 70–99)
Glucose-Capillary: 212 mg/dL — ABNORMAL HIGH (ref 70–99)
Glucose-Capillary: 164 mg/dL — ABNORMAL HIGH (ref 70–99)
Glucose-Capillary: 170 mg/dL — ABNORMAL HIGH (ref 70–99)
Glucose-Capillary: 191 mg/dL — ABNORMAL HIGH (ref 70–99)
Glucose-Capillary: 171 mg/dL — ABNORMAL HIGH (ref 70–99)

## 2021-07-29 LAB — RENAL FUNCTION PANEL
Albumin: 3 g/dL — ABNORMAL LOW (ref 3.5–5.0)
Anion gap: 8 (ref 5–15)
BUN: 16 mg/dL (ref 8–23)
CO2: 23 mmol/L (ref 22–32)
Calcium: 8.6 mg/dL — ABNORMAL LOW (ref 8.9–10.3)
Chloride: 105 mmol/L (ref 98–111)
Creatinine, Ser: 0.74 mg/dL (ref 0.61–1.24)
GFR, Estimated: >60 mL/min (ref >60)
Glucose, Bld: 198 mg/dL — ABNORMAL HIGH (ref 70–99)
Phosphorus: 3 mg/dL (ref 2.5–4.6)
Potassium: 3 mmol/L — ABNORMAL LOW (ref 3.5–5.1)
Sodium: 136 mmol/L (ref 135–145)

## 2021-07-29 LAB — MAGNESIUM: Magnesium: 1.8 mg/dL (ref 1.7–2.4)

## 2021-07-29 MED ORDER — POTASSIUM CHLORIDE CRYS ER 20 MEQ PO TBCR
40.0000 meq | EXTENDED_RELEASE_TABLET | ORAL | Status: AC
Start: 2021-07-29 — End: 2021-07-30
  Administered 2021-07-29 – 2021-07-30 (×3): 40 meq via ORAL
  Filled 2021-07-29 (×3): qty 2

## 2021-07-29 MED ORDER — MAGNESIUM SULFATE 2 GM/50ML IV SOLN
2.0000 g | Freq: Once | INTRAVENOUS | Status: AC
  Administered 2021-07-29: 2 g via INTRAVENOUS
  Filled 2021-07-29: qty 50

## 2021-07-29 NOTE — Progress Notes (Signed)
MRI shows stable subarachnoid hemorrhage.  There is no vascular etiology on CT angiogram, likely posttraumatic.  Recommend continue medical care/work-up. No neurosurgical intervention.  We will sign off at this time.   Thank you for allowing me to participate in this patient's care.  Please do not hesitate to call with questions or concerns.   Elwin Sleight, [facility] Cell: [phone]

## 2021-07-29 NOTE — Progress Notes (Signed)
EEG done at bedside. Results pending.  

## 2021-07-29 NOTE — Progress Notes (Signed)
PROGRESS NOTE    Peter Yang  HQI:[id number] DOB: August 17, 1956 DOA: 07/27/2021 PCP: Peter Elk, MD  Outpatient Specialists:   Brief Narrative:  As per H&P done on admission by Dr. Smitty Cords: "65 year old male with past medical history of recent left BKA, hypertension, diabetes mellitus type 2, diabetic polyneuropathy, BPH who presents to [facility] emergency department via EMS after being found down in his apartment with associated confusion.   Of note, patient was recently hospitalized at [facility] from on 9/8 after which he had a left BKA for necrotizing left foot infection.  Patient was eventually discharged on 9/14 to rehab and was discharged from rehab back home on 9/30.   According to EMS, patient lives at home by himself.  Per my discussion with ED staff, they were informed by EMS that they had also responded the patient's home on 10/7 and during that visit patient was found to be hypoglycemic but declined transfer to the hospital.  On 10/7, EMS was called back to the home after the sheriff found the patient down on the floor and confused.  Patient was then promptly brought into [facility] emergency room for evaluation.  Upon asking the patient the circumstances of how he ended up on the ground he simply says "I don't remember."     Upon evaluation in the emergency department noncontrast CT imaging of the head revealed evidence of an acute right sylvian fissure subarachnoid hemorrhage.  ER provider discussed case with Dr. [name] with neurosurgery who recommended obtaining CT angiogram of the head to evaluate for any evidence of aneurysm.  They also recommended loading patient with Keppra.  The hospitalist group was then called to assess patient for admission to the hospital"  07/28/2021: Patient seen.  Patient has not a particularly good historian.  Patient agrees that he only slipped out of the bed.  Patient also denied hitting his head.  Despite having  intracerebral bleed, patient could not or would not explain how the bleed came about.  Patient was also refusing MRI brain and EEG earlier.  Patient is not a particularly good historian.  Patient has not also very cooperative.  We will check urine drug screening.  Ability to live independently at home will need to be assessed prior to discharge.  Significantly, patient's glucose was 51 at some point.  Prior to admission, patient was on semaglutide 1 Mg every week and empagliflozin.  UA was negative for bacteria, specific gravity was 1.017, moderate hemoglobin, small leukocyte esterase and ketone of 20.  Potassium this morning was 3.1 with magnesium of 1.6.  07/29/2021: Patient seen today.  Neurosurgical input is appreciated, no surgical intervention.  Patient could not remember events leading to the admission.  Overall, patient looks better today.  Patient is more communicative today.  We will consult PT, OT and transition of care team.  Ability of the patient lives at home safely will need to be department.  Hopefully, patient be discharged in the next 24 to 48 hours.   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 Gentle intravenous hydration Serial neurologic checks MRI reveals stable subarachnoid hemorrhage. Patient was initially resistant to EEG.   RPR was nonreactive.  TSH was 2.045.  Vitamin B12 level was 364.     Acute cystitis without hematuria Doubt UTI. -Urine culture is showing multiple species. -We will repeat urine culture.   Subarachnoid hemorrhage ([place]) Identified on initial noncontrast CT imaging of the brain Managed by neurosurgery team.  No surgical intervention.  On Keppra for seizure prophylaxis per neurosurgery recommendation Target systolic blood pressures of less than 140 with as needed intravenous labetalol Avoiding anticoagulants Repeat MRI brain revealed stable subarachnoid hemorrhage.       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 Cheral Marker with neurology Dr. Cheral Marker with neurology recommending obtaining MRI brain to ensure there is no evidence of stroke Will proceed with consulting neurology if EEG or stroke reveal abnormalities -Awaiting neurology input.   Prolonged QT interval QTC noted to be 517 on EKG Monitoring patient on telemetry Correct abnormal electrolytes. Repeat EKG in the morning.     Diabetic polyneuropathy associated with type 2 diabetes mellitus (HCC) Holding gabapentin for now until mentation improves   Essential hypertension Continue home regimen of antihypertensive therapy Target systolic blood pressure of around 140 considering subarachnoid hemorrhage.   Uncontrolled type 2 diabetes mellitus with hypoglycemia without coma, with long-term current use of insulin ([place]) Patient found to have hypoglycemia on arrival  likely secondary to poor oral intake in setting of confusion and ongoing insulin use Holding home regimen of hypoglycemics and insulin Performing Accu-Cheks every 4 hours for now, will transition to before every meal nightly as blood sugars normalized If hypoglycemia persists will initiate dextrose infusion Hemoglobin A1c 4.7%.   Patient will need home health nursing for medication management if patient is discharged back home.   BPH with obstruction/lower urinary tract symptoms Continue home regimen of Proscar     DVT prophylaxis: SCD Code Status: Full code Family Communication:  Disposition Plan: This will depend on hospital course.   Consultants:  Neurosurgery  Procedures:  None  Antimicrobials:  Presentation to the ER, patient received IV cefepime, Flagyl and vancomycin.  Patient is currently on IV Rocephin.     Subjective: No new complaints. No significant history from patient. No shortness of breath.  No chest pain.  No fever or chills. Not in any distress.  Objective: Vitals:   07/28/21 1600 07/28/21 2019 07/29/21 0328 07/29/21 1545  BP: 117/68 140/71 132/77 (!) 145/82  Pulse: 60 69 (!) 56 71  Resp: 13  14 16   Temp:  (!) 97.4 F (36.3 C) 97.9 F (36.6 C) 97.8 F (36.6 C)  TempSrc:  Oral Oral Oral  SpO2: 98% 100% 99% 100%  Weight:      Height:        Intake/Output Summary (Last 24 hours) at 07/29/2021 1730 Last data filed at 07/29/2021 0342 Gross per 24 hour  Intake 460.3 ml  Output  1400 ml  Net -939.7 ml    Filed Weights   07/27/21 1616 07/28/21 0416  Weight: 91.8 kg 94.3 kg    Examination:  General exam: Appears calm and comfortable.  However, not very cooperative.  Patient is status post left BKA. Respiratory system: Decreased air entry globally.   Cardiovascular system: S1 & S2 Gastrointestinal system: Abdomen is nondistended, soft and nontender. No organomegaly or masses felt. Normal bowel sounds heard. Central nervous system: Awake and  alert.  Moves all extremities.   Extremities: S/p left BKA.  No edema of the right lower extremity.  Data Reviewed: I have personally reviewed following labs and imaging studies  CBC: Recent Labs  Lab 07/27/21 1507 07/28/21 0334  WBC 12.5* 8.3  NEUTROABS 9.9* 4.9  HGB 17.0 14.6  HCT 50.8 43.2  MCV 97.7 93.7  PLT 121* 073    Basic Metabolic Panel: Recent Labs  Lab 07/27/21 1507 07/28/21 0334 07/29/21 0344  NA 138 137 136  K 4.0 3.1* 3.0*  CL 108 106 105  CO2 20* 20* 23  GLUCOSE 51* 83 198*  BUN 21 18 16   CREATININE 0.83 0.83 0.74  CALCIUM 9.1 8.7* 8.6*  MG  --  1.6* 1.8  PHOS  --   --  3.0    GFR: Estimated Creatinine Clearance: 114.5 mL/min (by C-G formula based on SCr of 0.74 mg/dL). Liver Function Tests: Recent Labs  Lab 07/27/21 1507 07/28/21 0334 07/29/21 0344  AST 58* 44*  --   ALT 29 26  --   ALKPHOS 82 69  --   BILITOT 1.9* 2.1*  --   PROT 6.3* 5.8*  --   ALBUMIN 3.6 3.2* 3.0*    No results for input(s): LIPASE, AMYLASE in the last 168 hours. No results for input(s): AMMONIA in the last 168 hours. Coagulation Profile: Recent Labs  Lab 07/27/21 1507 07/28/21 0334  INR 1.0 1.2    Cardiac Enzymes: Recent Labs  Lab 07/28/21 0334  CKTOTAL 497*    BNP (last 3 results) No results for input(s): PROBNP in the last 8760 hours. HbA1C: Recent Labs    07/28/21 0334  HGBA1C 4.7*    CBG: Recent Labs  Lab 07/29/21 0753 07/29/21 1152 07/29/21 1204 07/29/21 1601 07/29/21 1627  GLUCAP 140* 177* 212* 170* 164*    Lipid Profile: No results for input(s): CHOL, HDL, LDLCALC, TRIG, CHOLHDL, LDLDIRECT in the last 72 hours. Thyroid Function Tests: Recent Labs    07/28/21 0334  TSH 2.045    Anemia Panel: Recent Labs    07/28/21 0334  VITAMINB12 364    Urine analysis:    Component Value Date/Time   COLORURINE YELLOW 07/27/2021 1507   APPEARANCEUR CLEAR 07/27/2021 1507   LABSPEC 1.017 07/27/2021 1507   PHURINE 5.0 07/27/2021  1507   GLUCOSEU 150 (A) 07/27/2021 1507   HGBUR MODERATE (A) 07/27/2021 1507   BILIRUBINUR NEGATIVE 07/27/2021 1507   KETONESUR 20 (A) 07/27/2021 1507   PROTEINUR 30 (A) 07/27/2021 1507   NITRITE NEGATIVE 07/27/2021 1507   LEUKOCYTESUR SMALL (A) 07/27/2021 1507   Sepsis Labs: @LABRCNTIP (procalcitonin:4,lacticidven:4)  ) Recent Results (from the past 240 hour(s))  Blood Culture (routine x 2)     Status: None (Preliminary result)   Collection Time: 07/27/21  3:07 PM   Specimen: BLOOD RIGHT WRIST  Result Value Ref Range Status   Specimen Description BLOOD RIGHT WRIST  Final   Special Requests   Final    BOTTLES DRAWN AEROBIC AND ANAEROBIC Blood Culture  results may not be optimal due to an inadequate volume of blood received in culture bottles   Culture   Final    NO GROWTH 2 DAYS Performed at [facility], [place] [address] [place], [place] [address]    Report Status PENDING  Incomplete  Resp Panel by RT-PCR (Flu A&B, Covid) Nasopharyngeal Swab     Status: None   Collection Time: 07/27/21  3:15 PM   Specimen: Nasopharyngeal Swab; Nasopharyngeal(NP) swabs in vial transport medium  Result Value Ref Range Status   SARS Coronavirus 2 by RT PCR NEGATIVE NEGATIVE Final    Comment: (NOTE) SARS-CoV-2 target nucleic acids are NOT DETECTED.  The SARS-CoV-2 RNA is generally detectable in upper respiratory specimens during the acute phase of infection. The lowest concentration of SARS-CoV-2 viral copies this assay can detect is 138 copies/mL. A negative result does not preclude SARS-Cov-2 infection and should not be used as the sole basis for treatment or other patient management decisions. A negative result may occur with  improper specimen collection/handling, submission of specimen other than nasopharyngeal swab, presence of viral mutation(s) within the areas targeted by this assay, and inadequate number of viral copies(<138 copies/mL). A negative result must be combined  with clinical observations, patient history, and epidemiological information. The expected result is Negative.  Fact Sheet for Patients:  EntrepreneurPulse.com.au  Fact Sheet for Healthcare Providers:  IncredibleEmployment.be  This test is no t yet approved or cleared by the [place] FDA and  has been authorized for detection and/or diagnosis of SARS-CoV-2 by FDA under an Emergency Use Authorization (EUA). This EUA will remain  in effect (meaning this test can be used) for the duration of the COVID-19 declaration under Section 564(b)(1) of the Act, 21 U.S.C.section 360bbb-3(b)(1), unless the authorization is terminated  or revoked sooner.       Influenza A by PCR NEGATIVE NEGATIVE Final   Influenza B by PCR NEGATIVE NEGATIVE Final    Comment: (NOTE) The Xpert Xpress SARS-CoV-2/FLU/RSV plus assay is intended as an aid in the diagnosis of influenza from Nasopharyngeal swab specimens and should not be used as a sole basis for treatment. Nasal washings and aspirates are unacceptable for Xpert Xpress SARS-CoV-2/FLU/RSV testing.  Fact Sheet for Patients: EntrepreneurPulse.com.au  Fact Sheet for Healthcare Providers: IncredibleEmployment.be  This test is not yet approved or cleared by the [place] FDA and has been authorized for detection and/or diagnosis of SARS-CoV-2 by FDA under an Emergency Use Authorization (EUA). This EUA will remain in effect (meaning this test can be used) for the duration of the COVID-19 declaration under Section 564(b)(1) of the Act, 21 U.S.C. section 360bbb-3(b)(1), unless the authorization is terminated or revoked.  Performed at [facility], [place] [address] [place], [place] [address]   Urine Culture     Status: Abnormal   Collection Time: 07/27/21  3:34 PM   Specimen: In/Out Cath Urine  Result Value Ref Range Status   Specimen Description IN/OUT CATH URINE   Final   Special Requests   Final    NONE Performed at [facility], [place] [address], [place], [place] [address]    Culture MULTIPLE SPECIES PRESENT, SUGGEST RECOLLECTION (A)  Final   Report Status 07/28/2021 FINAL  Final  Blood Culture (routine x 2)     Status: None (Preliminary result)   Collection Time: 07/27/21  3:35 PM   Specimen: BLOOD  Result Value Ref Range Status   Specimen Description BLOOD RIGHT ANTECUBITAL  Final  Special Requests   Final    BOTTLES DRAWN AEROBIC AND ANAEROBIC Blood Culture results may not be optimal due to an inadequate volume of blood received in culture bottles   Culture   Final    NO GROWTH 2 DAYS Performed at [facility], [place] [address], [place], [place] [address]    Report Status PENDING  Incomplete         Radiology Studies: CT Angio Head W or Wo Contrast  Result Date: 07/28/2021 CLINICAL DATA:  Initial evaluation for acute subarachnoid hemorrhage. Head trauma. EXAM: CT ANGIOGRAPHY HEAD TECHNIQUE: Multidetector CT imaging of the head was performed using the standard protocol during bolus administration of intravenous contrast. Multiplanar CT image reconstructions and MIPs were obtained to evaluate the vascular anatomy. CONTRAST:  41mL OMNIPAQUE IOHEXOL 350 MG/ML SOLN COMPARISON:  Prior head CT from earlier the same day. FINDINGS: CTA HEAD Anterior circulation: Visualized distal cervical segments of the internal carotid arteries are mildly irregular but patent without stenosis. Atheromatous change within the horizontal petrous segments without high-grade stenosis. Extensive plaque within the carotid siphons with associated moderate diffuse narrowing, right slightly worse than left. A1 segments patent bilaterally. Normal anterior communicating artery complex. Anterior cerebral arteries patent to their distal aspects without stenosis. Left M1 segment mildly irregular but patent. Normal left MCA bifurcation. Distal left MCA branches well  perfused. There is occlusion of the right MCA at its origin, presumably chronic. Scant attenuated flow within right MCA branches distally likely collateral. Posterior circulation: Atheromatous change seen about the V4 segments bilaterally with associated up to moderate stenoses. There is a more focal moderate to severe stenosis involving the proximal left V4 segment (series 7, image 161). Neither PICA origin well visualized on this exam. Basilar irregular but patent to its distal aspect without stenosis. Superior cerebral arteries are patent bilaterally. Both PCAs primarily supplied via the basilar. PCAs mildly irregular but are patent to their distal aspects without stenosis. Venous sinuses: Not well assessed due to timing the contrast bolus. Anatomic variants: None significant. No aneurysm or other vascular abnormality seen underlying the right sylvian subarachnoid hemorrhage. Review of the MIP images confirms the above findings. IMPRESSION: 1. No aneurysm or other vascular abnormality seen underlying the right Sylvian subarachnoid hemorrhage. 2. 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 Scant attenuated flow within right MCA branches distally, likely collateral. 3. Atherosclerotic change within the carotid siphons with associated moderate diffuse narrowing, right slightly worse than left. 4. Atheromatous change about the V4 segments bilaterally with associated up to moderate stenoses. Case discussed by telephone at the time of interpretation on 07/28/2021 at 12:31 am with the hospitalist, Dr. Cyd Silence, who verbally acknowledged these results. Electronically Signed   By: Jeannine Boga M.D.   On: 07/28/2021 00:37   DG Abd 1 View  Result Date: 07/28/2021 CLINICAL DATA:  Altered level of consciousness, abdominal discomfort EXAM: ABDOMEN - 1 VIEW COMPARISON:  None. FINDINGS: 2 supine frontal views of the abdomen and pelvis excludes the hemidiaphragms by collimation. No bowel obstruction  or ileus. Moderate retained stool throughout the colon. No masses or abnormal calcifications. No acute bony abnormalities. IMPRESSION: 1. Moderate retained stool.  No obstruction or ileus. Electronically Signed   By: Randa Ngo M.D.   On: 07/28/2021 18:34   CT HEAD WO CONTRAST (5MM)  Addendum Date: 07/27/2021   ADDENDUM REPORT: 07/27/2021 22:08 ADDENDUM: These results were called by telephone at the time of interpretation on 07/27/2021 at 10:05 pm to provider  Rayna Sexton , who verbally acknowledged these results. Electronically Signed   By: Iven Finn M.D.   On: 07/27/2021 22:08   Result Date: 07/27/2021 CLINICAL DATA:  Delirium. EXAM: CT HEAD WITHOUT CONTRAST TECHNIQUE: Contiguous axial images were obtained from the base of the skull through the vertex without intravenous contrast. COMPARISON:  None. BRAIN: BRAIN Cerebral ventricle sizes are concordant with the degree of cerebral volume loss. Patchy and confluent areas of decreased attenuation are noted throughout the deep and periventricular white matter of the cerebral hemispheres bilaterally, compatible with chronic microvascular ischemic disease. No evidence of large-territorial acute infarction. No parenchymal hemorrhage. No mass lesion. Extra-axial hyperdense fluid within the right sylvian fissure. No mass effect or midline shift. No hydrocephalus. Basilar cisterns are patent. Vascular: No hyperdense vessel. Atherosclerotic calcifications are present within the cavernous internal carotid and vertebral arteries. Skull: No acute fracture or focal lesion. Sinuses/Orbits: Paranasal sinuses and mastoid air cells are clear. The orbits are unremarkable. Other: None. IMPRESSION: Acute right sylvian fissure subarachnoid hemorrhage. Electronically Signed: By: Iven Finn M.D. On: 07/27/2021 20:37   MR BRAIN WO CONTRAST  Result Date: 07/28/2021 CLINICAL DATA:  Acute neurologic deficit.  Intracranial hemorrhage. EXAM: MRI HEAD WITHOUT CONTRAST  TECHNIQUE: Multiplanar, multiecho pulse sequences of the brain and surrounding structures were obtained without intravenous contrast. COMPARISON:  Head CT 07/27/2021 FINDINGS: Brain: There are small foci of abnormal diffusion restriction within the posterior left parietal lobe. Subarachnoid blood in the right sylvian fissure is unchanged. There is multifocal hyperintense T2-weighted signal within the white matter. Generalized volume loss without a clear lobar predilection. The midline structures are normal. There are old small vessel infarcts of the cerebellum and right basal ganglia. Vascular: Major flow voids are preserved. Skull and upper cervical spine: Normal calvarium and skull base. Visualized upper cervical spine and soft tissues are normal. Sinuses/Orbits:No paranasal sinus fluid levels or advanced mucosal thickening. No mastoid or middle ear effusion. Normal orbits. IMPRESSION: 1. Punctate foci of acute ischemia within the posterior left parietal lobe. 2. Unchanged subarachnoid blood in the right Sylvian fissure. 3. 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 Electronically Signed   By: Ulyses Jarred M.D.   On: 07/28/2021 21:54   EEG adult  Result Date: 07/28/2021 Lora Havens, MD     07/29/2021  1:36 PM Patient Name: Peter Yang MRN: [id number] Epilepsy Attending: Lora Havens Referring Physician/Provider: Dr Inda Merlin Date: 07/29/2021 Duration: 25.14 mins Patient history: 65yo m with ams. CTH showed acute right sylvian fissure subarachnoid hemorrhage. EEG to evaluate for seizure Level of alertness: Awake, asleep AEDs during EEG study: LEV Technical aspects: This EEG study was done with scalp electrodes positioned according to the 10-20 International system of electrode placement. Electrical activity was acquired at a sampling rate of 500Hz  and reviewed with a high frequency filter of 70Hz  and a low frequency filter of 1Hz . EEG data were recorded continuously and  digitally stored. Description: The posterior dominant rhythm consists of 8-9 Hz activity of moderate voltage (25-35 uV) seen predominantly in posterior head regions, symmetric and reactive to eye opening and eye closing. Sleep was characterized by vertex waves, sleep spindles (12 to 14 Hz), maximal frontocentral region. EEG showed intermittent generalized and maximal right temporal 3 to 6 Hz theta-delta slowing. Hyperventilation and photic stimulation were not performed.   ABNORMALITY - Intermittent slow, generalized and maximal right temporal IMPRESSION: This study is suggestive of cortical dysfunction arising from right temporal region, non specific etiology but could be  secondary to underlying sub-arachnoid hemorrhage. Additionally there is mild diffuse encephalopathy, nonspecific etiology. No seizures or epileptiform discharges were seen throughout the recording. Priyanka Barbra Sarks        Scheduled Meds:  amLODipine  5 mg Oral Daily   atorvastatin  40 mg Oral Daily   finasteride  5 mg Oral Daily   insulin aspart  0-15 Units Subcutaneous Q4H   levETIRAcetam  500 mg Oral BID   metoprolol tartrate  25 mg Oral BID   Continuous Infusions:  cefTRIAXone (ROCEPHIN)  IV 1 g (07/29/21 1120)     LOS: 2 days    Time spent: 35 minutes    Dana Allan, MD  [facility] Pager #: [phone] 7PM-7AM contact night coverage as above

## 2021-07-30 ENCOUNTER — Other Ambulatory Visit (HOSPITAL_COMMUNITY): Payer: Self-pay

## 2021-07-30 DIAGNOSIS — I10 Essential (primary) hypertension: Secondary | ICD-10-CM | POA: Diagnosis not present

## 2021-07-30 DIAGNOSIS — E1142 Type 2 diabetes mellitus with diabetic polyneuropathy: Secondary | ICD-10-CM | POA: Diagnosis not present

## 2021-07-30 DIAGNOSIS — N3000 Acute cystitis without hematuria: Secondary | ICD-10-CM | POA: Diagnosis not present

## 2021-07-30 DIAGNOSIS — N401 Enlarged prostate with lower urinary tract symptoms: Secondary | ICD-10-CM | POA: Diagnosis not present

## 2021-07-30 LAB — GLUCOSE, CAPILLARY
Glucose-Capillary: 151 mg/dL — ABNORMAL HIGH (ref 70–99)
Glucose-Capillary: 132 mg/dL — ABNORMAL HIGH (ref 70–99)
Glucose-Capillary: 242 mg/dL — ABNORMAL HIGH (ref 70–99)
Glucose-Capillary: 131 mg/dL — ABNORMAL HIGH (ref 70–99)
Glucose-Capillary: 147 mg/dL — ABNORMAL HIGH (ref 70–99)

## 2021-07-30 LAB — RENAL FUNCTION PANEL
Albumin: 3 g/dL — ABNORMAL LOW (ref 3.5–5.0)
Anion gap: 8 (ref 5–15)
BUN: 14 mg/dL (ref 8–23)
CO2: 21 mmol/L — ABNORMAL LOW (ref 22–32)
Calcium: 8.6 mg/dL — ABNORMAL LOW (ref 8.9–10.3)
Chloride: 108 mmol/L (ref 98–111)
Creatinine, Ser: 0.76 mg/dL (ref 0.61–1.24)
GFR, Estimated: >60 mL/min (ref >60)
Glucose, Bld: 139 mg/dL — ABNORMAL HIGH (ref 70–99)
Phosphorus: 3 mg/dL (ref 2.5–4.6)
Potassium: 3.5 mmol/L (ref 3.5–5.1)
Sodium: 137 mmol/L (ref 135–145)

## 2021-07-30 LAB — RAPID URINE DRUG SCREEN, HOSP PERFORMED
Amphetamines: NOT DETECTED
Barbiturates: NOT DETECTED
Benzodiazepines: NOT DETECTED
Cocaine: NOT DETECTED
Opiates: NOT DETECTED
Tetrahydrocannabinol: NOT DETECTED

## 2021-07-30 LAB — MAGNESIUM: Magnesium: 1.8 mg/dL (ref 1.7–2.4)

## 2021-07-30 MED ORDER — ACETAMINOPHEN 325 MG PO TABS
650.0000 mg | ORAL_TABLET | Freq: Four times a day (QID) | ORAL | 0 refills | Status: DC | PRN
  Filled 2021-07-30: qty 30, 4d supply, fill #0

## 2021-07-30 MED ORDER — POLYETHYLENE GLYCOL 3350 17 GM/SCOOP PO POWD
17.0000 g | Freq: Every day | ORAL | 0 refills | Status: DC | PRN
  Filled 2021-07-30: qty 238, 14d supply, fill #0

## 2021-07-30 MED ORDER — CLOPIDOGREL BISULFATE 75 MG PO TABS: 75.0000 mg | ORAL_TABLET | Freq: Every day | ORAL | Status: DC

## 2021-07-30 MED ORDER — LEVETIRACETAM 500 MG PO TABS
500.0000 mg | ORAL_TABLET | Freq: Two times a day (BID) | ORAL | 0 refills | Status: DC
  Filled 2021-07-30: qty 14, 7d supply, fill #0

## 2021-07-30 NOTE — TOC Transition Note (Signed)
Transition of Care [facility]) - CM/SW Discharge Note   Patient Details  Name: Peter Yang MRN: [id number] Date of Birth: 06/02/1956  [facility] CM/SW Contact:  Pollie Friar, RN Phone Number: 07/30/2021, 3:53 PM   Clinical Narrative:    Patient is discharging home with resumption of [place] services through [facility] home health. Seth Bake with [facility] is aware of new orders and d/c home today. Pt denies issues with transportation. He states his neighbor takes him where needed.  Pt has a friend that does his shopping.  Pt denies issues with home medications.  He has walker/ wheelchair/ 3 in 1 for home.  CM has arranged transport for him home today as neighbor doesn't have the keys to patients vehicle with him being in the hospital.    Final next level of care: [place] Barriers to Discharge: No Barriers Identified   Patient Goals and CMS Choice     Choice offered to / list presented to : Patient  Discharge Placement                       Discharge Plan and Services                          HH Arranged: RN, PT, OT, Social Work [facility] Agency: [facility] [place] Date [place]: 07/30/21   Representative spoke with at [place]: [place]) Interventions     Readmission Risk Interventions No flowsheet data found.

## 2021-07-30 NOTE — Evaluation (Signed)
Occupational Therapy Evaluation Patient Details Name: Peter Yang MRN: [id number] DOB: 1955/11/18 Today's Date: 07/30/2021   History of Present Illness PT is a 65 yo male admitted with [place] after being found down in his house.  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 Pt with low blood sugars at home since dc.   Clinical Impression   Pt admitted with the above diagnosis and has the deficits listed below. Pt would benefit from cont OT to increase independence and safety with all basic adls and home skills. Pt lives alone and would benefit from [facility] at d/c to ensure safety and independence in home environment.     Recommendations for follow up therapy are one component of a multi-disciplinary discharge planning process, led by the attending physician.  Recommendations may be updated based on patient status, additional functional criteria and insurance authorization.   Follow Up Recommendations  Home health OT;Supervision - Intermittent    Equipment Recommendations  3 in 1 bedside commode    Recommendations for Other Services       Precautions / Restrictions Precautions Precautions: Fall Precaution Comments: Pt with recent BKA and lives alone with low blood sugars. Restrictions Weight Bearing Restrictions: No      Mobility Bed Mobility Overal bed mobility: Modified Independent             General bed mobility comments: Bed flat but used rail.    Transfers Overall transfer level: Needs assistance Equipment used: Rolling walker (2 wheeled) Transfers: Sit to/from [facility] Sit to Stand: Min guard Stand pivot transfers: Supervision       General transfer comment: Cues to push hard to stand.  L shoulder gives pt some problems.    Balance Overall balance assessment: Mild deficits observed, not formally tested                                         ADL either performed or assessed with clinical judgement   ADL  Overall ADL's : Needs assistance/impaired Eating/Feeding: Independent;Sitting   Grooming: Wash/dry hands;Oral care;Wash/dry face;Applying deodorant;Supervision/safety;Standing   Upper Body Bathing: Set up;Sitting   Lower Body Bathing: Supervison/ safety;Sit to/from stand   Upper Body Dressing : Set up;Sitting   Lower Body Dressing: Supervision/safety;Sit to/from stand   Toilet Transfer: Supervision/safety;Ambulation;Comfort height toilet;BSC;RW   Toileting- Clothing Manipulation and Hygiene: Supervision/safety;Sit to/from stand       Functional mobility during ADLs: Supervision/safety;Rolling walker General ADL Comments: Pt doing well with adls from bed level and w/c level. Pt states his BSC is rickety and not working well.  Could use new one. Pt also may benefit from second walker in the home in the bathroom. Pt leaves walker in kitchen at all times to manage kitchen. Difficult to move walker from kitchen to bathroom regularly.     Vision Baseline Vision/History: 0 No visual deficits Ability to See in Adequate Light: 0 Adequate Patient Visual Report: No change from baseline Vision Assessment?: No apparent visual deficits     Perception Perception Perception Tested?: No   Praxis Praxis Praxis tested?: Within functional limits    Pertinent Vitals/Pain Pain Assessment: No/denies pain     Hand Dominance Right   Extremity/Trunk Assessment Upper Extremity Assessment Upper Extremity Assessment: LUE deficits/detail LUE Deficits / Details: Pt with two failed rotator cuff surgeries. Limited ROM to 4050 degrees shoulder flextion. LUE: Unable to fully assess due to  pain LUE Sensation: WNL LUE Coordination: decreased gross motor   Lower Extremity Assessment Lower Extremity Assessment: Defer to PT evaluation   Cervical / Trunk Assessment Cervical / Trunk Assessment: Normal   Communication Communication Communication: No difficulties   Cognition Arousal/Alertness:  Awake/alert Behavior During Therapy: WFL for tasks assessed/performed Overall Cognitive Status: Within Functional Limits for tasks assessed                                 General Comments: Pt cognitiively intact.  Pt appears easily irritated at times with time it takes to get assist, the nursing home he was in prior to amputation, DME etc but very reasonable with explanations of why and is frustrated with current situation and inability to do for h imself.   General Comments  Pt wants to return home. Feel if blood sugar issues are taken care of and pt can find someone to assist with grocery shopping until he can drive, pt may be safe to go back home.  Feel HHOT is warrented.    Exercises     Shoulder Instructions      Home Living Family/patient expects to be discharged to:: Private residence Living Arrangements: Alone Available Help at Discharge: Available PRN/intermittently;Other (Comment) (from [facility] and a coworker) Type of Home: House Home Access: [place]: One level     Bathroom Shower/Tub: [place] unit;Curtain   Biochemist, clinical: Standard Bathroom Accessibility: Yes How Accessible: Accessible via walker Home Equipment: [place] - 2 wheels;Wheelchair - manual;Tub bench;Bedside commode   Additional Comments: Pt works at [employer]      Prior Functioning/Environment Level of Independence: Needs assistance  Gait / Transfers Assistance Needed: Pt primarily using w/c in his house and walker at times to walk into the bathroom and stand at the sink. ADL's / Homemaking Assistance Needed: Pt managing meals from w/c and walker level in kitchen.  Has had a friend to bring him groceries but this friend has  had surgery so may not be available.   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  Wants to return to work soon.        OT Problem List: Impaired balance (sitting and/or standing);Decreased safety awareness;Decreased knowledge of use of  DME or AE      OT Treatment/Interventions: Self-care/ADL training;Therapeutic activities    OT Goals(Current goals can be found in the care plan section) Acute Rehab OT Goals Patient Stated Goal: to go home OT Goal Formulation: With patient Time For Goal Achievement: 08/13/21 Potential to Achieve Goals: Good ADL Goals Additional ADL Goal #1: Pt will walk to bathroom with walker and complete all toileting tasks with mod I. Additional ADL Goal #2: Pt will walk in room to gather clothes and fully dress self with mod I. Additional ADL Goal #3: Pt will walk to sink with walker and fully bathe self with mod I  OT Frequency: Min 2X/week   Barriers to D/C: Decreased caregiver support  no consistent outside support available.       Co-evaluation PT/OT/SLP Co-Evaluation/Treatment: Yes Reason for Co-Treatment: Complexity of the patient's impairments (multi-system involvement) PT goals addressed during session: Mobility/safety with mobility OT goals addressed during session: ADL's and self-care      AM-PAC OT "6 Clicks" Daily Activity     Outcome Measure Help from another person eating meals?: None Help from another person taking care of personal grooming?: None Help from  another person toileting, which includes using toliet, bedpan, or urinal?: A Little Help from another person bathing (including washing, rinsing, drying)?: None Help from another person to put on and taking off regular upper body clothing?: None Help from another person to put on and taking off regular lower body clothing?: A Little 6 Click Score: 22   End of Session Equipment Utilized During Treatment: Rolling walker;Gait belt Nurse Communication: Mobility status  Activity Tolerance: Patient tolerated treatment well Patient left: in bed;with call bell/phone within reach;with bed alarm set  OT Visit Diagnosis: Unsteadiness on feet (R26.81)                Time: 8592-9244 OT Time Calculation (min): 36 min Charges:   OT General Charges $OT Visit: 1 Visit OT Evaluation $OT Eval Moderate Complexity: 1 Mod  Glenford Peers 07/30/2021, 10:30 AM

## 2021-07-30 NOTE — Plan of Care (Signed)
  Problem: Education: Goal: Knowledge of General Education information will improve Description: Including pain rating scale, medication(s)/side effects and non-pharmacologic comfort measures Outcome: Adequate for Discharge   Problem: Safety: Goal: Ability to remain free from injury will improve Outcome: Adequate for Discharge   Problem: Education: Goal: Knowledge of disease or condition will improve Outcome: Adequate for Discharge Goal: Knowledge of secondary prevention will improve Outcome: Adequate for Discharge   Problem: Health Behavior/Discharge Planning: Goal: Ability to manage health-related needs will improve Outcome: Adequate for Discharge   Problem: Intracerebral Hemorrhage Tissue Perfusion: Goal: Complications of Intracerebral Hemorrhage will be minimized Outcome: Adequate for Discharge   Problem: Self-Care: Goal: Ability to participate in self-care as condition permits will improve Outcome: Adequate for Discharge   Problem: Spontaneous Subarachnoid Hemorrhage Tissue Perfusion: Goal: Complications of Spontaneous Subarachnoid Hemorrhage will be minimized Outcome: Adequate for Discharge   Problem: Intracerebral Hemorrhage Tissue Perfusion: Goal: Complications of Intracerebral Hemorrhage will be minimized Outcome: Adequate for Discharge

## 2021-07-30 NOTE — Progress Notes (Signed)
Inpatient Diabetes Program Recommendations  AACE/ADA: New Consensus Statement on Inpatient Glycemic Control   Target Ranges:  Prepandial:   less than 140 mg/dL      Peak postprandial:   less than 180 mg/dL (1-2 hours)      Critically ill patients:  140 - 180 mg/dL   Results for FERNANDO, STOIBER (MRN [id number]) as of 07/30/2021 16:15  Ref. Range 07/29/2021 07:53 07/29/2021 11:52 07/29/2021 12:04 07/29/2021 16:01 07/29/2021 16:27 07/29/2021 19:58 07/29/2021 23:20 07/30/2021 03:16 07/30/2021 07:54 07/30/2021 12:07  Glucose-Capillary Latest Ref Range: 70 - 99 mg/dL 140 (H) 177 (H) 212 (H) 170 (H) 164 (H) 191 (H) 171 (H) 151 (H) 132 (H) 242 (H)  Results for KIYON, FIDALGO (MRN [id number]) as of 07/30/2021 16:15  Ref. Range 07/27/2021 15:07 07/28/2021 03:34  Glucose Latest Ref Range: 70 - 99 mg/dL 51 (L) 83  Hemoglobin A1C Latest Ref Range: 4.8 - 5.6 %  4.7 (L)    Review of Glycemic Control  Diabetes history: DM2 Outpatient Diabetes medications: Metformin 1000 mg BID, Jardiance 25 mg daily, Ozmepic 1 mg Qweek (Monday), Tresiba 80 units QHS Current orders for Inpatient glycemic control: Novolog 0-15 units Q4H  NOTE: Received page regarding discharge recommendations for DM medications. Spoke with patient at bedside. Patient reports that he sees Dr. Alanson Aly (Endocrinologist) for DM control and last seen her in January or February of this year. 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 Patient states he was in rehab after amputation and he just got discharged on Friday (07/20/21). Patient states he was told by rehab provider to gradually start taking medications he was on prior to admission. Patient states that he took his Metformin and Jardiance for several days and then he restarted taking [place] on Sunday night. Patient states he does not recall having any symptoms of hypoglycemia prior to severe hypoglycemic episode that lead to this admission.  Patient states his outpatient physical therapist found him unresponsive and called 911. He does not recall much of anything prior to them getting his glucose back up and they reported his initial glucose was 27 mg/dl. Patient reports that he has some FreeStyle Libre2 samples at home but he has not started them yet; he has had them for several months. Asked patient to be sure they are not expired if he uses them. Patient agreeable to use FreeStyle Libre2 and Dr. Alfredia Ferguson provided orders for patient to be given samples of FreeStyle Libre2 reader and sensors. Assisted patient to apply FreeStyle Libre2 to back of left upper arm and instructed patient how to use it. Informed patient that sensor has alarms to alert him if glucose is less than 70 mg/dl or if over 240 mg/dl. Asked patient to call Dr. Meredith Pel to get an appointment as soon as possible for follow up.  Asked patient to contact Dr. Meredith Pel if he has any issues with hypoglycemia or if glucose is staying over 200 mg/dl. Stressed need to keep glucose well controlled for wound healing without any issues with hypoglycemia. Discussed current A1C of 4.7% indicating average glucose of 88 mg/dl over the past 2-3 months. Explained that given current A1C, 60 pound weight loss, and significant hypoglycemia, his outpatient DM medications need to be adjusted. Discharging provider plans to discharge from hospital on Metformin and Jardiance and have patient follow up with Dr. Meredith Pel. Patient wants to be sure DM is well controlled so that his wound continues to heal well from  amputation as he is hopeful to get prosthetic by December. Patient states he has an appointment with Dr. Nyoka Cowden on 08/01/21 to remove staples and he wants to be sure he does not miss that appointment. Informed patient that outpatient DM education would be consulted so they can follow up with him on DM control as well. Patient appreciative of information discussed and verbalized understanding of information and  has no questions at this time.   Barnie Alderman, RN, MSN, CDE Diabetes Coordinator Inpatient Diabetes Program [phone] (Team Pager from 8am to 5pm)

## 2021-07-30 NOTE — Discharge Summary (Signed)
Physician Discharge Summary  Peter Yang GUY:[id number] DOB: May 06, 1956 DOA: 07/27/2021  PCP: [name], MD  Admit date: 07/27/2021 Discharge date: 07/30/2021  Admitted From: Home Disposition:  Home with Home Health PT/OT  Recommendations for Outpatient Follow-up:  Follow up with PCP in 1-2 weeks Follow up with Endocrinology Dr. Meredith Pel within 1-2 weeks Follow up with Vascular Surgery Dr. Nyoka Cowden on 08/01/21 for staple removal  Follow up with Neurology Thomos Lemons of [facility] within 1 week and will need an outpatient Lipid Panel and ECHOCardiogram as well as discussion about addition for ASA  Follow up with Neurosurgery Dr. [name] within 1-2 weeks and c/w Keppra for 1 week Please obtain CMP/CBC, Mag, Phos in one week Please follow up on the following pending results: Repeat Urine Cx; Will need Outpatient ECHO and Lipid Panel   Home Health: Yes   Equipment/Devices: 3 in 1 bedside commode and Rolling Walker with 5" Wheels     Discharge Condition: Stable  CODE STATUS: FULL CODE  Diet recommendation: Heart Healthy/Carb Modified Diet   Brief/Interim Summary: The patient is a 65 year old chronically ill-appearing Caucasian male with past medical history of recent BKA who was recently discharged from rehab, hypertension, diabetes mellitus type 2, diabetic polyneuropathy, BPH and other comorbidities who presented to [facility] ED via EMS after being found down in his apartment with associated confusion.  Of note he was [facility] when she had a BKA for necrotizing left foot infection.  Was eventually discharged on 914 2 rehab and was discharged from rehab back, 07/20/2021.  According to EMS he lives at home by himself and per discussion with the ED staff there found that EMS also refer the patient to [facility] time and during that night is found to be hypoglycemic and declined transfer to the hospital.  On 07/27/2021 MS was called back to home  after doctor found the patient down on the floor confused.  He is probably brought to [facility] for evaluation upon arriving he did not know how he got to the hospital.  Upon further evaluation and noncontrast CT of the head was done and revealed evidence of an acute right sylvian fissure subarachnoid hemorrhage.  ED provider discussed with neurosurgery Dr. Richmond Campbell who recommended obtaining a CTA of the head to evaluate for any evidence of aneurysm.  They also recommended loading the patient with Keppra.  Leonides Sake imaging showed that he had very tiny subarachnoid hemorrhage and neurosurgery recommended resuming Plavix in 7 days and continuing Keppra for 7 days total.  He improved to his baseline and thinks he took too much of his insulin and upon further review he has a hemoglobin A1c of 4.7.  Of note he is also lost about 60 pounds recently and had been taking quite high insulin dosing which she resumed the night that he was found down.  His encephalopathy improved back to his baseline and PT OT evaluated and they recommended home health.  He is deemed medically stable to be discharged back home and continue PT OT efforts and follow-up with PCP and endocrinology in outpatient setting.  Initially they thought he had a UTI but however this is unlikely given that his encephalopathy is in the setting of hypoglycemia.  Will need to follow-up with PCP, endocrinology and neurosurgery, and Neurology in the outpatient setting.  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 -Gentle intravenous hydration -Serial neurologic checks -MRI reveals stable subarachnoid hemorrhage and there is no vascular etiology and CTA and this was likely posttraumatic -Patient was initially resistant to EEG but had it done which showed that patient had cortical dysfunction arising from the right temporal region is nonspecific in etiology but could be secondary to underlying subarachnoid hemorrhage.  Additionally there is mild diffuse encephalopathy nonspecific etiology with no seizures or epileptiform discharges seen throughout the reading.   -RPR was nonreactive.  TSH was 2.045.  Vitamin B12 level was 364.   -UDS negative  -TSH was 2.045  and RPR was Non-Reactive  -Patient got back to his baseline and his insulin was held.  PT OT recommended home health -Case was discussed with neurosurgery Dr. [name] who recommended holding his Plavix for 7 days and resuming on the 17th and also recommended 7 days of p.o. Keppra twice daily   Acute cystitis without hematuria -Doubt UTI. -Urine culture is showing multiple species. -We will repeat urine culture is pending  -He was empirically treated with IV antibiotics with ceftriaxone.   Subarachnoid hemorrhage ([place]) -Identified on initial noncontrast CT imaging of the brain -Managed by neurosurgery team.  No surgical intervention.  On Keppra for seizure prophylaxis per neurosurgery  recommendation and they recommended for 7 days -Target systolic blood pressures of less than 140 with as needed intravenous labetalol -Avoiding anticoagulants -Repeat MRI brain revealed stable subarachnoid hemorrhage.   -Per neurosurgery recommendations resume Plavix in 7 days and ASA 81 mg Daly   Occlusion of right middle cerebral artery  Mild Right Parietal Acute CVA in the setting of Acute Fall and Head Trauma  Hx of CVA -Occlusion of the Right MCA is an Incidental finding, likely chronic per Dr. Darrick Grinder discussions with both radiology as well as Dr. Cheral Marker with neurology -CTA showed "No aneurysm or other vascular abnormality seen underlying the right Sylvian subarachnoid hemorrhage.  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 Scant attenuated flow within right MCA branches distally, likely collateral. Atherosclerotic change within the carotid siphons with associated  moderate diffuse narrowing, right slightly worse than left. Atheromatous change about the V4 segments bilaterally with associated up to moderate stenoses." -Dr. Cheral Marker with neurology recommending obtaining MRI brain to ensure there is no evidence of stroke -Will proceed with consulting neurology if EEG or stroke reveal abnormalities -EEG done and showed "This study is suggestive of cortical dysfunction arising from right temporal region, non specific etiology but could be secondary to underlying sub-arachnoid hemorrhage. Additionally there is mild diffuse encephalopathy, nonspecific etiology. No seizures or epileptiform discharges were seen  throughout the recording."  -Last Lipid Panel was in 2018 and showed a Cholesterol Level of 142, TG of 217,  HDL of 31, and LDL of 68 with Cholesterol/HDL Ratio of 142; He takes Atorvastatin 40 mg po qHS and will need to continue  -MRI showed stable subarachnoid hemorrhage and a punctate left acute ischemia within the posterior left parietal lobe -I spoke with Dr. Lynnae Sandhoff of Neurology who recommended ECHOCardiogram to be done to complete CVA workup; Have arranged outpatient Neurology Follow up with [facility] for ECHOCardiogram and evaluation within 1 week when stable from a [facility] perspective but patient prefers [facility] so number was given for [facility] in [facility] at [phone] to call and schedule follow up as he has seen Thomos Lemons already there -He does take ASA 81 mg po Daily, Chlopidogrel 75 mg po Daily, and Atorvastatin 40 mg po qHS already  -Please obtain Obtain Outpatient Lipid Panel and ECHO -PT/OT recommended Home health and he is stable to D/C    Prolonged QT interval -QTC noted to be 517 on EKG -Monitoring patient on telemetry -Correct abnormal electrolytes. -Repeat EKG in the outpatient setting.     Diabetic polyneuropathy associated with type 2 diabetes mellitus (HCC) -Holding gabapentin for now until mentation improves -He was placed on sliding scale insulin while he is here and was relatively well controlled -Resume home metformin and Jardiance at discharge   Essential Hypertension -Continue home regimen of antihypertensive therapy -Target systolic blood pressure of around 140 considering subarachnoid hemorrhage. -Last BP was 137/87   Uncontrolled type 2 diabetes mellitus with hypoglycemia without coma, with long-term current use of insulin ([place]) -Patient found to have hypoglycemia on arrival likely secondary to poor oral intake in setting of confusion and ongoing insulin use -Holding home regimen of hypoglycemics and insulin -Performing Accu-Cheks every 4 hours for now, will transition to before every meal nightly as blood sugars normalized -If hypoglycemia persists will initiate dextrose infusion -Hemoglobin A1c 4.7%.   -Patient to go home on Oral Metformin 1000 mg po BID and Jardiance and follow up with Dr. Meredith Pel as an outpatient   HLD -Check Lipid Panel int he outpatient Setting -Continue Atorvastatin 40 mg  po qHS   BPH with obstruction/lower urinary tract symptoms -Continue home regimen of Proscar  Metabolic Acidosis -Mild with a CO2 of 21, AG of 8, Chloride Level of 21 -Continue to Monitor and Trend and Repeat CMP within 1 week  Hyperbilirubinemia -Mild and went from 1.9 -> 2.1 and likely reactive -Was not repeated but can repeat within 1 week  Discharge Instructions  Discharge Instructions     Ambulatory referral to Neurology   Complete by: As directed    An appointment is requested in approximately: 1 week; Follow up for Mild Acute CVA in the setting of Fall; Will need an outpatient ECHOCARDIOGRAM to complete workup and discussion about addition to add ASA   Ambulatory referral to Nutrition and Diabetic Education   Complete by: As directed    Recent discharge from rehab at [facility] and told to restart medications gradually. Ended up with severe hypoglycemia; being discharged on Metformin and Jardiance (discontinuing Tresiba and Ozempic). Pt to follow up with Dr. Meredith Pel (Endocrinologist). Pt had amputation and wound is healing well; need to keep good glycemic control. Please follow up with pt. Thank you!   Call MD for:  difficulty breathing, headache or visual disturbances   Complete by: As directed    Call MD for:  extreme fatigue   Complete by: As directed    Call MD for:  hives   Complete by: As directed  Call MD for:  persistant dizziness or light-headedness   Complete by: As directed    Call MD for:  persistant nausea and vomiting   Complete by: As directed    Call MD for:  redness, tenderness, or signs of infection (pain, swelling, redness, odor or green/yellow discharge around incision site)   Complete by: As directed    Call MD for:  severe uncontrolled pain   Complete by: As directed    Call MD for:  temperature >100.4   Complete by: As directed    Diet - low sodium heart healthy   Complete by: As directed    Diet Carb Modified   Complete by: As directed     Discharge instructions   Complete by: As directed    You were cared for by a hospitalist during your hospital stay. If you have any questions about your discharge medications or the care you received while you were in the hospital after you are discharged, you can call the unit and ask to speak with the hospitalist on call if the hospitalist that took care of you is not available. Once you are discharged, your primary care physician will handle any further medical issues. Please note that NO REFILLS for any discharge medications will be authorized once you are discharged, as it is imperative that you return to your primary care physician (or establish a relationship with a primary care physician if you do not have one) for your aftercare needs so that they can reassess your need for medications and monitor your lab values.  Follow up with PCP and Endocrinology. Take all medications as prescribed. If symptoms change or worsen please return to the ED for evaluation   Increase activity slowly   Complete by: As directed    No wound care   Complete by: As directed       Allergies as of 07/30/2021   No Known Allergies      Medication List     STOP taking these medications    Ozempic (1 MG/DOSE) 4 MG/3ML Sopn Generic drug: Semaglutide (1 MG/DOSE)   Tyler Aas FlexTouch 200 UNIT/ML FlexTouch Pen Generic drug: insulin degludec       TAKE these medications    acetaminophen 325 MG tablet Commonly known as: TYLENOL Take 2 tablets (650 mg total) by mouth every 6 (six) hours as needed for mild pain (or Fever >/= 101).   amLODipine 5 MG tablet Commonly known as: NORVASC Take 5 mg by mouth daily.   atorvastatin 40 MG tablet Commonly known as: LIPITOR Take 40 mg by mouth daily.   clopidogrel 75 MG tablet Commonly known as: PLAVIX Take 1 tablet (75 mg total) by mouth daily. Start taking on: August 06, 2021 What changed: These instructions start on August 06, 2021. If you are unsure what  to do until then, ask your doctor or other care provider.   diclofenac Sodium 1 % Gel Commonly known as: VOLTAREN SMARTSIG:Gram(s) Topical Twice Daily PRN   empagliflozin 25 MG Tabs tablet Commonly known as: JARDIANCE Take 25 mg by mouth daily.   finasteride 5 MG tablet Commonly known as: PROSCAR Take 5 mg by mouth daily.   gabapentin 300 MG capsule Commonly known as: NEURONTIN Take 300 mg by mouth.   levETIRAcetam 500 MG tablet Commonly known as: KEPPRA Take 1 tablet (500 mg total) by mouth 2 (two) times daily for 7 days.   metFORMIN 500 MG tablet Commonly known as: GLUCOPHAGE Take 1,000 mg by mouth 2 (  two) times daily with a meal.   metoprolol tartrate 25 MG tablet Commonly known as: LOPRESSOR Take 25 mg by mouth 2 (two) times daily.   oxyCODONE 5 MG immediate release tablet Commonly known as: Oxy IR/ROXICODONE Take by mouth.   polyethylene glycol powder 17 GM/SCOOP powder Commonly known as: GLYCOLAX/MIRALAX Take 1 capful (17 )g by mouth daily as needed for mild constipation.   potassium chloride SA 20 MEQ tablet Commonly known as: KLOR-CON Take 20 mEq by mouth daily.   traZODone 50 MG tablet Commonly known as: DESYREL Take 50-150 mg by mouth at bedtime as needed.        Follow-up Information     [facility] Follow [facility].   Specialty: Home Health Services Why: THe home health agency will contact you for the next home visit. Contact information: [address] [place] [address] [phone]                No Known Allergies  Consultations: Neurosurgery Dr. Cyd Silence discussed with Dr. Cheral Marker of Neurology  Procedures/Studies: CT Angio Head W or Wo Contrast  Result Date: 07/28/2021 CLINICAL DATA:  Initial evaluation for acute subarachnoid hemorrhage. Head trauma. EXAM: CT ANGIOGRAPHY HEAD TECHNIQUE: Multidetector CT imaging of the head was performed using the standard protocol during bolus administration of intravenous  contrast. Multiplanar CT image reconstructions and MIPs were obtained to evaluate the vascular anatomy. CONTRAST:  5mL OMNIPAQUE IOHEXOL 350 MG/ML SOLN COMPARISON:  Prior head CT from earlier the same day. FINDINGS: CTA HEAD Anterior circulation: Visualized distal cervical segments of the internal carotid arteries are mildly irregular but patent without stenosis. Atheromatous change within the horizontal petrous segments without high-grade stenosis. Extensive plaque within the carotid siphons with associated moderate diffuse narrowing, right slightly worse than left. A1 segments patent bilaterally. Normal anterior communicating artery complex. Anterior cerebral arteries patent to their distal aspects without stenosis. Left M1 segment mildly irregular but patent. Normal left MCA bifurcation. Distal left MCA branches well perfused. There is occlusion of the right MCA at its origin, presumably chronic. Scant attenuated flow within right MCA branches distally likely collateral. Posterior circulation: Atheromatous change seen about the V4 segments bilaterally with associated up to moderate stenoses. There is a more focal moderate to severe stenosis involving the proximal left V4 segment (series 7, image 161). Neither PICA origin well visualized on this exam. Basilar irregular but patent to its distal aspect without stenosis. Superior cerebral arteries are patent bilaterally. Both PCAs primarily supplied via the basilar. PCAs mildly irregular but are patent to their distal aspects without stenosis. Venous sinuses: Not well assessed due to timing the contrast bolus. Anatomic variants: None significant. No aneurysm or other vascular abnormality seen underlying the right sylvian subarachnoid hemorrhage. Review of the MIP images confirms the above findings. IMPRESSION: 1. No aneurysm or other vascular abnormality seen underlying the right Sylvian subarachnoid hemorrhage. 2. Occlusion of the right MCA at its origin,  presumably chronic. Scant attenuated flow within right MCA branches distally, likely collateral. 3. Atherosclerotic change within the carotid siphons with associated moderate diffuse narrowing, right slightly worse than left. 4. Atheromatous change about the V4 segments bilaterally with associated up to moderate stenoses. Case discussed by telephone at the time of interpretation on 07/28/2021 at 12:31 am with the hospitalist, Dr. Cyd Silence, who verbally acknowledged these results. Electronically Signed   By: Jeannine Boga M.D.   On: 07/28/2021 00:37   DG Abd 1 View  Result Date: 07/28/2021 CLINICAL DATA:  Altered level of consciousness, abdominal discomfort EXAM: ABDOMEN - 1 VIEW COMPARISON:  None. FINDINGS: 2 supine frontal views of the abdomen and pelvis excludes the hemidiaphragms by collimation. No bowel obstruction or ileus. Moderate retained stool throughout the colon. No masses or abnormal calcifications. No acute bony abnormalities. IMPRESSION: 1. Moderate retained stool.  No obstruction or ileus. Electronically Signed   By: Randa Ngo M.D.   On: 07/28/2021 18:34   CT HEAD WO CONTRAST (5MM)  Addendum Date: 07/27/2021   ADDENDUM REPORT: 07/27/2021 22:08 ADDENDUM: These results were called by telephone at the time of interpretation on 07/27/2021 at 10:05 pm to provider [facility] , who verbally acknowledged these results. Electronically Signed   By: Iven Finn M.D.   On: 07/27/2021 22:08   Result Date: 07/27/2021 CLINICAL DATA:  Delirium. EXAM: CT HEAD WITHOUT CONTRAST TECHNIQUE: Contiguous axial images were obtained from the base of the skull through the vertex without intravenous contrast. COMPARISON:  None. BRAIN: BRAIN Cerebral ventricle sizes are concordant with the degree of cerebral volume loss. Patchy and confluent areas of decreased attenuation are noted throughout the deep and periventricular white matter of the cerebral hemispheres bilaterally, compatible with chronic  microvascular ischemic disease. No evidence of large-territorial acute infarction. No parenchymal hemorrhage. No mass lesion. Extra-axial hyperdense fluid within the right sylvian fissure. No mass effect or midline shift. No hydrocephalus. Basilar cisterns are patent. Vascular: No hyperdense vessel. Atherosclerotic calcifications are present within the cavernous internal carotid and vertebral arteries. Skull: No acute fracture or focal lesion. Sinuses/Orbits: Paranasal sinuses and mastoid air cells are clear. The orbits are unremarkable. Other: None. IMPRESSION: Acute right sylvian fissure subarachnoid hemorrhage. Electronically Signed: By: Iven Finn M.D. On: 07/27/2021 20:37   MR BRAIN WO CONTRAST  Result Date: 07/28/2021 CLINICAL DATA:  Acute neurologic deficit.  Intracranial hemorrhage. EXAM: MRI HEAD WITHOUT CONTRAST TECHNIQUE: Multiplanar, multiecho pulse sequences of the brain and surrounding structures were obtained without intravenous contrast. COMPARISON:  Head CT 07/27/2021 FINDINGS: Brain: There are small foci of abnormal diffusion restriction within the posterior left parietal lobe. Subarachnoid blood in the right sylvian fissure is unchanged. There is multifocal hyperintense T2-weighted signal within the white matter. Generalized volume loss without a clear lobar predilection. The midline structures are normal. There are old small vessel infarcts of the cerebellum and right basal ganglia. Vascular: Major flow voids are preserved. Skull and upper cervical spine: Normal calvarium and skull base. Visualized upper cervical spine and soft tissues are normal. Sinuses/Orbits:No paranasal sinus fluid levels or advanced mucosal thickening. No mastoid or middle ear effusion. Normal orbits. IMPRESSION: 1. Punctate foci of acute ischemia within the posterior left parietal lobe. 2. Unchanged subarachnoid blood in the right Sylvian fissure. 3. 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  Electronically Signed   By: Ulyses Jarred M.D.   On: 07/28/2021 21:54   DG Chest Port 1 View  Result Date: 07/27/2021 CLINICAL DATA:  Questionable sepsis, evaluate for abnormality. EXAM: PORTABLE CHEST 1 VIEW COMPARISON:  Chest radiograph 12/12/2020 FINDINGS: Of note, the patient is a rotated on this portable examination. [name] chin overlies the left apex. The heart size and mediastinal contours are within normal limits, allowing for differences in patient positioning. Aortic calcification. Both lungs are clear. No pleural effusion or pneumothorax. The visualized skeletal structures are unremarkable. IMPRESSION: No active disease. Electronically Signed   By: Ileana Roup M.D.   On: 07/27/2021 15:51   EEG adult  Result Date: 07/28/2021 Lora Havens, MD  07/29/2021  1:36 PM Patient Name: Peter Yang MRN: [id number] Epilepsy Attending: Lora Havens Referring Physician/Provider: Dr Inda Merlin Date: 07/29/2021 Duration: 25.14 mins Patient history: 65yo m with ams. CTH showed acute right sylvian fissure subarachnoid hemorrhage. EEG to evaluate for seizure Level of alertness: Awake, asleep AEDs during EEG study: LEV Technical aspects: This EEG study was done with scalp electrodes positioned according to the 10-20 International system of electrode placement. Electrical activity was acquired at a sampling rate of 500Hz  and reviewed with a high frequency filter of 70Hz  and a low frequency filter of 1Hz . EEG data were recorded continuously and digitally stored. Description: The posterior dominant rhythm consists of 8-9 Hz activity of moderate voltage (25-35 uV) seen predominantly in posterior head regions, symmetric and reactive to eye opening and eye closing. Sleep was characterized by vertex waves, sleep spindles (12 to 14 Hz), maximal frontocentral region. EEG showed intermittent generalized and maximal right temporal 3 to 6 Hz theta-delta slowing. Hyperventilation and photic stimulation  were not performed.   ABNORMALITY - Intermittent slow, generalized and maximal right temporal IMPRESSION: This study is suggestive of cortical dysfunction arising from right temporal region, non specific etiology but could be secondary to underlying sub-arachnoid hemorrhage. Additionally there is mild diffuse encephalopathy, nonspecific etiology. No seizures or epileptiform discharges were seen throughout the recording. Priyanka Barbra Sarks    Subjective: Seen and examined at bedside and was back to his baseline and had no focal deficits. Felt well and PT/OT recommending home Health. Patient was advised to follow up with PCP, Neurology and Neurosurgery in the outpatient setting and will arrange for outpatient ECHO and Lipid Panel. He understands and agrees with the plan of care and is stable to D/C home with Home health.  Discharge Exam: Vitals:   07/30/21 1205 07/30/21 1627  BP: 137/74 137/87  Pulse: 74 73  Resp: 14 16  Temp: 98.2 F (36.8 C) 98.7 F (37.1 C)  SpO2: 99% 99%   Vitals:   07/30/21 0342 07/30/21 0856 07/30/21 1205 07/30/21 1627  BP: (!) 144/73 (!) 141/88 137/74 137/87  Pulse: 65 63 74 73  Resp:  20 14 16   Temp: 98.3 F (36.8 C) 97.8 F (36.6 C) 98.2 F (36.8 C) 98.7 F (37.1 C)  TempSrc: Axillary Oral Oral Oral  SpO2: 97% 100% 99% 99%  Weight:      Height:       General: Pt is alert, awake, not in acute distress Cardiovascular: RRR, S1/S2 +, no rubs, no gallops Respiratory: CTA bilaterally, no wheezing, no rhonchi; unlabored breathing Abdominal: Soft, NT, 5 to 7-second body habitus, bowel sounds + 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    Microbiology: Recent Results (from the past 240 hour(s))  Blood Culture (routine x 2)     Status: None (Preliminary result)   Collection Time: 07/27/21  3:07 PM   Specimen: BLOOD RIGHT  WRIST  Result Value Ref Range Status   Specimen Description BLOOD RIGHT WRIST  Final   Special Requests   Final    BOTTLES DRAWN AEROBIC AND ANAEROBIC Blood Culture results may not be optimal due to an inadequate volume of blood received in culture bottles   Culture   Final    NO GROWTH 3 DAYS Performed at [facility], [place] [address] [place], [place] [address]    Report Status PENDING  Incomplete  Resp Panel  by RT-PCR (Flu A&B, Covid) Nasopharyngeal Swab     Status: None   Collection Time: 07/27/21  3:15 PM   Specimen: Nasopharyngeal Swab; Nasopharyngeal(NP) swabs in vial transport medium  Result Value Ref Range Status   SARS Coronavirus 2 by RT PCR NEGATIVE NEGATIVE Final    Comment: (NOTE) SARS-CoV-2 target nucleic acids are NOT DETECTED.  The SARS-CoV-2 RNA is generally detectable in upper respiratory specimens during the acute phase of infection. The lowest concentration of SARS-CoV-2 viral copies this assay can detect is 138 copies/mL. A negative result does not preclude SARS-Cov-2 infection and should not be used as the sole basis for treatment or other patient management decisions. A negative result may occur with  improper specimen collection/handling, submission of specimen other than nasopharyngeal swab, presence of viral mutation(s) within the areas targeted by this assay, and inadequate number of viral copies(<138 copies/mL). A negative result must be combined with clinical observations, patient history, and epidemiological information. The expected result is Negative.  Fact Sheet for Patients:  EntrepreneurPulse.com.au  Fact Sheet for Healthcare Providers:  IncredibleEmployment.be  This test is no t yet approved or cleared by the [place] FDA and  has been authorized for detection and/or diagnosis of SARS-CoV-2 by FDA under an Emergency Use Authorization (EUA). This EUA will remain  in effect (meaning this test can  be used) for the duration of the COVID-19 declaration under Section 564(b)(1) of the Act, 21 U.S.C.section 360bbb-3(b)(1), unless the authorization is terminated  or revoked sooner.       Influenza A by PCR NEGATIVE NEGATIVE Final   Influenza B by PCR NEGATIVE NEGATIVE Final    Comment: (NOTE) The Xpert Xpress SARS-CoV-2/FLU/RSV plus assay is intended as an aid in the diagnosis of influenza from Nasopharyngeal swab specimens and should not be used as a sole basis for treatment. Nasal washings and aspirates are unacceptable for Xpert Xpress SARS-CoV-2/FLU/RSV testing.  Fact Sheet for Patients: EntrepreneurPulse.com.au  Fact Sheet for Healthcare Providers: IncredibleEmployment.be  This test is not yet approved or cleared by the [place] FDA and has been authorized for detection and/or diagnosis of SARS-CoV-2 by FDA under an Emergency Use Authorization (EUA). This EUA will remain in effect (meaning this test can be used) for the duration of the COVID-19 declaration under Section 564(b)(1) of the Act, 21 U.S.C. section 360bbb-3(b)(1), unless the authorization is terminated or revoked.  Performed at [facility], [place] [address], [place], [place] [address]   Urine Culture     Status: Abnormal   Collection Time: 07/27/21  3:34 PM   Specimen: In/Out Cath Urine  Result Value Ref Range Status   Specimen Description IN/OUT CATH URINE  Final   Special Requests   Final    NONE Performed at [facility], [place] [address], [place], [place] [address]    Culture MULTIPLE SPECIES PRESENT, SUGGEST RECOLLECTION (A)  Final   Report Status 07/28/2021 FINAL  Final  Blood Culture (routine x 2)     Status: None (Preliminary result)   Collection Time: 07/27/21  3:35 PM   Specimen: BLOOD  Result Value Ref Range Status   Specimen Description BLOOD RIGHT ANTECUBITAL  Final   Special Requests   Final    BOTTLES DRAWN AEROBIC AND ANAEROBIC Blood  Culture results may not be optimal due to an inadequate volume of blood received in culture bottles   Culture   Final    NO GROWTH 3 DAYS Performed at [facility], [address] [place], [place] [address]    Report Status PENDING  Incomplete     Labs: BNP (last 3 results) No results for input(s): BNP in the last 8760 hours. Basic Metabolic Panel: Recent Labs  Lab 07/27/21 1507 07/28/21 0334 07/29/21 0344 07/30/21 0324  NA 138 137 136 137  K 4.0 3.1* 3.0* 3.5  CL 108 106 105 108  CO2 20* 20* 23 21*  GLUCOSE 51* 83 198* 139*  BUN 21 18 16 14   CREATININE 0.83 0.83 0.74 0.76  CALCIUM 9.1 8.7* 8.6* 8.6*  MG  --  1.6* 1.8 1.8  PHOS  --   --  3.0 3.0   Liver Function Tests: Recent Labs  Lab 07/27/21 1507 07/28/21 0334 07/29/21 0344 07/30/21 0324  AST 58* 44*  --   --   ALT 29 26  --   --   ALKPHOS 82 69  --   --   BILITOT 1.9* 2.1*  --   --   PROT 6.3* 5.8*  --   --   ALBUMIN 3.6 3.2* 3.0* 3.0*   No results for input(s): LIPASE, AMYLASE in the last 168 hours. No results for input(s): AMMONIA in the last 168 hours. CBC: Recent Labs  Lab 07/27/21 1507 07/28/21 0334  WBC 12.5* 8.3  NEUTROABS 9.9* 4.9  HGB 17.0 14.6  HCT 50.8 43.2  MCV 97.7 93.7  PLT 121* 172   Cardiac Enzymes: Recent Labs  Lab 07/28/21 0334  CKTOTAL 497*   BNP: Invalid input(s): POCBNP CBG: Recent Labs  Lab 07/29/21 2320 07/30/21 0316 07/30/21 0754 07/30/21 1207 07/30/21 1633  GLUCAP 171* 151* 132* 242* 131*   D-Dimer No results for input(s): DDIMER in the last 72 hours. Hgb A1c Recent Labs    07/28/21 0334  HGBA1C 4.7*   Lipid Profile No results for input(s): CHOL, HDL, LDLCALC, TRIG, CHOLHDL, LDLDIRECT in the last 72 hours. Thyroid function studies Recent Labs    07/28/21 0334  TSH 2.045   Anemia work up Recent Labs    07/28/21 0334  VITAMINB12 364   Urinalysis    Component Value Date/Time   COLORURINE YELLOW 07/27/2021 1507   APPEARANCEUR CLEAR  07/27/2021 1507   LABSPEC 1.017 07/27/2021 1507   PHURINE 5.0 07/27/2021 1507   GLUCOSEU 150 (A) 07/27/2021 1507   HGBUR MODERATE (A) 07/27/2021 1507   BILIRUBINUR NEGATIVE 07/27/2021 1507   KETONESUR 20 (A) 07/27/2021 1507   PROTEINUR 30 (A) 07/27/2021 1507   NITRITE NEGATIVE 07/27/2021 1507   LEUKOCYTESUR SMALL (A) 07/27/2021 1507   Sepsis Labs Invalid input(s): PROCALCITONIN,  WBC,  LACTICIDVEN Microbiology Recent Results (from the past 240 hour(s))  Blood Culture (routine x 2)     Status: None (Preliminary result)   Collection Time: 07/27/21  3:07 PM   Specimen: BLOOD RIGHT WRIST  Result Value Ref Range Status   Specimen Description BLOOD RIGHT WRIST  Final   Special Requests   Final    BOTTLES DRAWN AEROBIC AND ANAEROBIC Blood Culture results may not be optimal due to an inadequate volume of blood received in culture bottles   Culture   Final    NO GROWTH 3 DAYS Performed at [facility], [place] [address] [place], [place] [address]    Report Status PENDING  Incomplete  Resp Panel by RT-PCR (Flu A&B, Covid) Nasopharyngeal Swab     Status: None   Collection Time: 07/27/21  3:15 PM   Specimen: Nasopharyngeal Swab; Nasopharyngeal(NP) swabs in vial transport  medium  Result Value Ref Range Status   SARS Coronavirus 2 by RT PCR NEGATIVE NEGATIVE Final    Comment: (NOTE) SARS-CoV-2 target nucleic acids are NOT DETECTED.  The SARS-CoV-2 RNA is generally detectable in upper respiratory specimens during the acute phase of infection. The lowest concentration of SARS-CoV-2 viral copies this assay can detect is 138 copies/mL. A negative result does not preclude SARS-Cov-2 infection and should not be used as the sole basis for treatment or other patient management decisions. A negative result may occur with  improper specimen collection/handling, submission of specimen other than nasopharyngeal swab, presence of viral mutation(s) within the areas targeted by this assay, and  inadequate number of viral copies(<138 copies/mL). A negative result must be combined with clinical observations, patient history, and epidemiological information. The expected result is Negative.  Fact Sheet for Patients:  EntrepreneurPulse.com.au  Fact Sheet for Healthcare Providers:  IncredibleEmployment.be  This test is no t yet approved or cleared by the [place] FDA and  has been authorized for detection and/or diagnosis of SARS-CoV-2 by FDA under an Emergency Use Authorization (EUA). This EUA will remain  in effect (meaning this test can be used) for the duration of the COVID-19 declaration under Section 564(b)(1) of the Act, 21 U.S.C.section 360bbb-3(b)(1), unless the authorization is terminated  or revoked sooner.       Influenza A by PCR NEGATIVE NEGATIVE Final   Influenza B by PCR NEGATIVE NEGATIVE Final    Comment: (NOTE) The Xpert Xpress SARS-CoV-2/FLU/RSV plus assay is intended as an aid in the diagnosis of influenza from Nasopharyngeal swab specimens and should not be used as a sole basis for treatment. Nasal washings and aspirates are unacceptable for Xpert Xpress SARS-CoV-2/FLU/RSV testing.  Fact Sheet for Patients: EntrepreneurPulse.com.au  Fact Sheet for Healthcare Providers: IncredibleEmployment.be  This test is not yet approved or cleared by the [place] FDA and has been authorized for detection and/or diagnosis of SARS-CoV-2 by FDA under an Emergency Use Authorization (EUA). This EUA will remain in effect (meaning this test can be used) for the duration of the COVID-19 declaration under Section 564(b)(1) of the Act, 21 U.S.C. section 360bbb-3(b)(1), unless the authorization is terminated or revoked.  Performed at [facility], [place] [address] [place], [place] [address]   Urine Culture     Status: Abnormal   Collection Time: 07/27/21  3:34 PM   Specimen: In/Out  Cath Urine  Result Value Ref Range Status   Specimen Description IN/OUT CATH URINE  Final   Special Requests   Final    NONE Performed at [facility], [place] [address] [place], [place] [address]    Culture MULTIPLE SPECIES PRESENT, SUGGEST RECOLLECTION (A)  Final   Report Status 07/28/2021 FINAL  Final  Blood Culture (routine x 2)     Status: None (Preliminary result)   Collection Time: 07/27/21  3:35 PM   Specimen: BLOOD  Result Value Ref Range Status   Specimen Description BLOOD RIGHT ANTECUBITAL  Final   Special Requests   Final    BOTTLES DRAWN AEROBIC AND ANAEROBIC Blood Culture results may not be optimal due to an inadequate volume of blood received in culture bottles   Culture   Final    NO GROWTH 3 DAYS Performed at [facility], [place] [address] [place], [place] [address]    Report Status PENDING  Incomplete   Time coordinating discharge: 35 minutes  SIGNED:  Kerney Elbe, DO [facility] 07/30/2021,  5:10 PM Pager is on AMION  If 7PM-7AM, please contact night-coverage [email or web address]

## 2021-07-30 NOTE — Evaluation (Signed)
Physical Therapy Evaluation  Patient Details Name: Nester Bachus MRN: [id number] DOB: 10-08-1956 Today's Date: 07/30/2021  History of Present Illness  Pt is a 65 yo male admitted with [place] after being found down in his home 2 possible hypoglycemic event. Pt does not recall the events preceding EMS arriving to his home. Pt recently returned home from inpatient rehab on 9/30 after L BKA (during admission at [facility] 9/8). Other PMH significant for HTN, IDDM.   Clinical Impression  Pt admitted with above diagnosis. Pt currently with functional limitations due to the deficits listed below (see PT Problem List). At the time of PT eval pt was able to perform transfers and ambulation with up to light guard to supervision for safety and RW for support. Pt already has equipment, however is inquiring about an additional RW to have access in bathroom, as well as a new 3-in-1 as pt states the one he has is not comfortable (states it is different than the 3-in-1 BSC we showed him during session). Functionally, pt appears to be mobilizing well enough to be able to manage at home with [facility] therapies and social support. If hypoglycemic events are controlled pt will likely be safe for d/c home. Acutely, pt will benefit from skilled PT to increase their independence and safety with mobility to allow discharge to the venue listed below.          Recommendations for follow up therapy are one component of a multi-disciplinary discharge planning process, led by the attending physician.  Recommendations may be updated based on patient status, additional functional criteria and insurance authorization.  Follow Up Recommendations Home health PT;Supervision for mobility/OOB    Equipment Recommendations  Rolling walker with 5" wheels;3in1 (PT)    Recommendations for Other Services       Precautions / Restrictions Precautions Precautions: Fall Precaution Comments: Pt with recent BKA and lives alone with low  blood sugars. Restrictions Weight Bearing Restrictions: No      Mobility  Bed Mobility Overal bed mobility: Modified Independent             General bed mobility comments: HOB flat with min use of rails.    Transfers Overall transfer level: Needs assistance Equipment used: Rolling walker (2 wheeled) Transfers: Sit to/from [facility] Sit to Stand: Min guard Stand pivot transfers: Supervision       General transfer comment: Light guard to supervision for safety as pt powered up to full stand. Good standing balance with RW for support.  Ambulation/Gait Ambulation/Gait assistance: Min guard;Supervision Gait Distance (Feet): 25 Feet Assistive device: Rolling walker (2 wheeled) Gait Pattern/deviations: Decreased stride length;Trunk flexed;Step-to pattern Gait velocity: Decreased Gait velocity interpretation: <1.31 ft/sec, indicative of household ambulator General Gait Details: Pt with good control of swing-to gait pattern with RW for support. Therapist managed lines, especially during turns. No overt LOB noted. Pt was standing and/or walking for ~5 minutes total.  Stairs            Wheelchair Mobility    Modified Rankin (Stroke Patients Only)       Balance Overall balance assessment: Mild deficits observed, not formally tested                                           Pertinent Vitals/Pain Pain Assessment: No/denies pain    Home Living Family/patient expects to be discharged to:: Private  residence Living Arrangements: Alone Available Help at Discharge: Available PRN/intermittently;Other (Comment) (from [facility] and a coworker) Type of Home: House Home Access: [place]: One [place]: [place] - 2 wheels;Wheelchair - manual;Tub bench;Bedside commode Additional Comments: Pt works at [employer] Level of Independence: Needs assistance   Gait / Transfers Assistance Needed:  Pt primarily using w/c in his house and walker at times to walk into the bathroom and stand at the sink.  ADL's / Homemaking Assistance Needed: Pt managing meals from w/c and walker level in kitchen. Has had a friend to bring him groceries but this friend has  had surgery so may not be available.  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  Wants to return to work soon.     Hand Dominance   Dominant Hand: Right    Extremity/Trunk Assessment   Upper Extremity Assessment Upper Extremity Assessment: Defer to OT evaluation    Lower Extremity Assessment Lower Extremity Assessment: LLE deficits/detail LLE Deficits / Details: s/p BKA with knee immobilizer donned. Pt reports he was told not to take the knee immobilizer off until he gets his staples out. He has been removing the knee immobilizer a couple times a day for ROM and then again dons it.    Cervical / Trunk Assessment Cervical / Trunk Assessment: Normal;Other exceptions Cervical / Trunk Exceptions: Forward head posture with rounded shoulders  Communication   Communication: No difficulties  Cognition Arousal/Alertness: Awake/alert Behavior During Therapy: WFL for tasks assessed/performed Overall Cognitive Status: Within Functional Limits for tasks assessed                                 General Comments: Pt cognitiively intact.  Pt appears easily irritated at times with time it takes to get assist, the nursing home he was in prior to amputation, DME etc but very reasonable with explanations of why and is frustrated with current situation and inability to do for himself.      General Comments      Exercises     Assessment/Plan    PT Assessment Patient needs continued PT services  PT Problem List Decreased strength;Decreased range of motion;Decreased activity tolerance;Decreased balance;Decreased mobility;Decreased knowledge of use of DME;Decreased safety awareness;Decreased knowledge of  precautions;Pain       PT Treatment Interventions DME instruction;Gait training;Stair training;Functional mobility training;Therapeutic activities;Therapeutic exercise;Neuromuscular re-education;Patient/family education    PT Goals (Current goals can be found in the Care Plan section)  Acute Rehab PT Goals Patient Stated Goal: Get his prosthetic in December and go back to work ASAP PT Goal Formulation: With patient Time For Goal Achievement: 08/06/21 Potential to Achieve Goals: Good    Frequency Min 3X/week   Barriers to discharge        Co-evaluation PT/OT/SLP Co-Evaluation/Treatment: Yes Reason for Co-Treatment: Complexity of the patient's impairments (multi-system involvement);Necessary to address cognition/behavior during functional activity;For patient/therapist safety;To address functional/ADL transfers           AM-PAC PT "6 Clicks" Mobility  Outcome Measure Help needed turning from your back to your side while in a flat bed without using bedrails?: None Help needed moving from lying on your back to sitting on the side of a flat bed without using bedrails?: A Little Help needed moving to and from a bed to a chair (including a wheelchair)?: A Little Help needed standing up from a  chair using your arms (e.g., wheelchair or bedside chair)?: A Little Help needed to walk in hospital room?: A Little Help needed climbing 3-5 steps with a railing? : A Lot 6 Click Score: 18    End of Session Equipment Utilized During Treatment: Gait belt;Left knee immobilizer Activity Tolerance: Patient tolerated treatment well Patient left: in bed;with call bell/phone within reach;with bed alarm set Nurse Communication: Mobility status PT Visit Diagnosis: Unsteadiness on feet (R26.81);History of falling (Z91.81)    Time: 0258-5277 PT Time Calculation (min) (ACUTE ONLY): 37 min   Charges:   PT Evaluation $PT Eval Moderate Complexity: 1 Mod          Rolinda Roan, PT, DPT Acute  Rehabilitation Services Pager: [phone] Office: [phone]   Thelma Comp 07/30/2021, 1:40 PM

## 2021-07-31 LAB — URINE CULTURE: Culture: NO GROWTH

## 2021-07-31 LAB — HIV ANTIBODY (ROUTINE TESTING W REFLEX): HIV Screen 4th Generation wRfx: NONREACTIVE

## 2021-08-01 LAB — CULTURE, BLOOD (ROUTINE X 2)
Culture: NO GROWTH
Culture: NO GROWTH

## 2021-08-03 DIAGNOSIS — Z741 Need for assistance with personal care: Secondary | ICD-10-CM

## 2022-01-19 HISTORY — PX: CORONARY ANGIOPLASTY WITH STENT PLACEMENT: SHX49

## 2022-02-11 DIAGNOSIS — Z955 Presence of coronary angioplasty implant and graft: Secondary | ICD-10-CM

## 2022-02-11 HISTORY — DX: Presence of coronary angioplasty implant and graft: Z95.5

## 2022-02-12 DIAGNOSIS — I255 Ischemic cardiomyopathy: Secondary | ICD-10-CM | POA: Insufficient documentation

## 2022-05-11 IMAGING — MR MR HEAD W/O CM
12 of 17 series · 34 of 48 positions shown · non-contrast
Comparison: Head CT 07/27/2021

CLINICAL DATA: Acute neurologic deficit.  Intracranial hemorrhage.

EXAM:
MRI HEAD WITHOUT CONTRAST
TECHNIQUE: Multiplanar, multiecho pulse sequences of the brain and surrounding
structures were obtained without intravenous contrast.

[Series 5: DWI · axial · 3.0mm · 0.88mm/px · z∈[-57,+97]mm · 5 of 106 slices shown (1 of 4)]
[im 1/106]
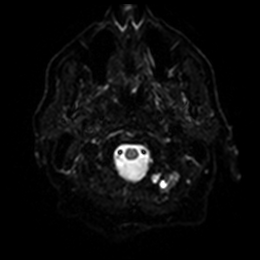
[im 27/106]
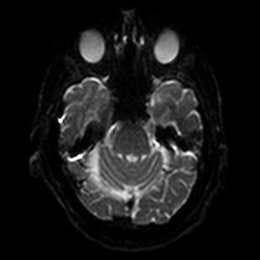
[im 53/106]
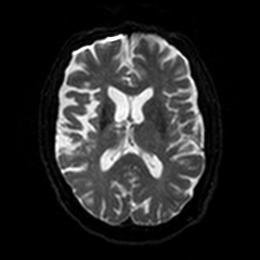
[im 79/106]
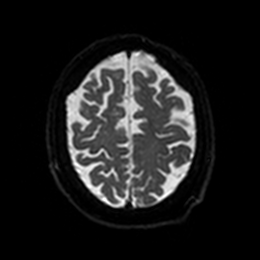
[im 106/106]
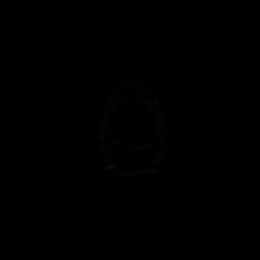

[Series 6: DWI · axial · 3.0mm · 0.88mm/px · z∈[-57,+97]mm · 2 of 53 slices shown (2 of 4)]
[im 1/53]
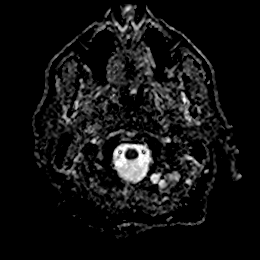
[im 53/53]
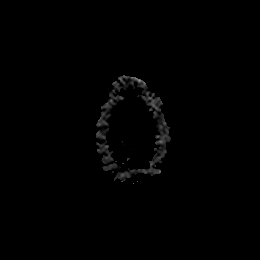

[Series 7: DWI · coronal · 4.0mm · 0.88mm/px · 3 of 72 slices shown (3 of 4)]
[im 1/72]
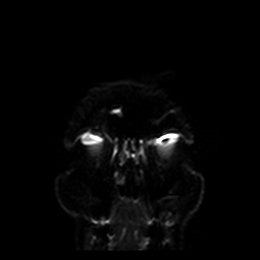
[im 36/72]
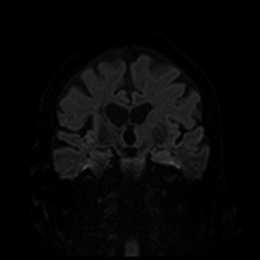
[im 72/72]
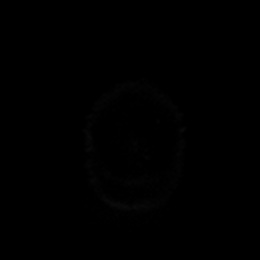

[Series 8: DWI · coronal · 4.0mm · 0.88mm/px · 1 of 36 slices shown (4 of 4)]
[im 1/36]
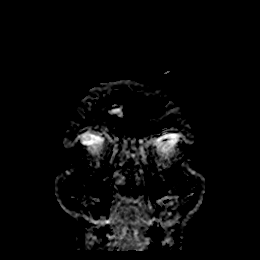

[Series 9: T1 · sagittal · 5.0mm · 0.75mm/px · 2 of 26 slices shown]
[im 1/26]
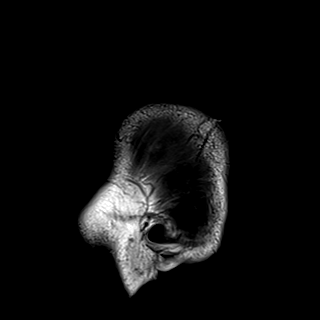
[im 26/26]
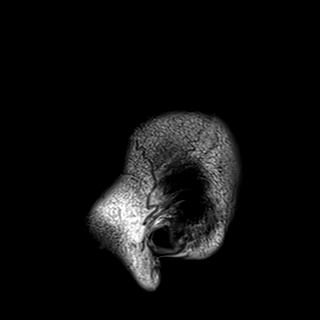

[Series 10: T2 · axial · 5.0mm · 0.72mm/px · z∈[-65,+101]mm · 2 of 29 slices shown (1 of 2)]
[im 1/29]
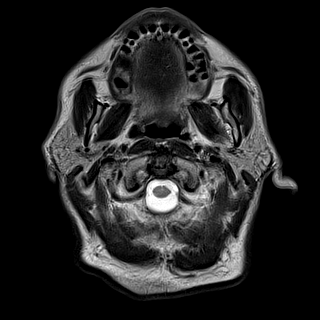
[im 29/29]
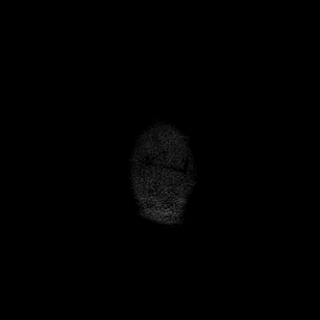

[Series 11: FLAIR · axial · 5.0mm · 0.45mm/px · z∈[-65,+101]mm · 2 of 29 slices shown]
[im 1/29]
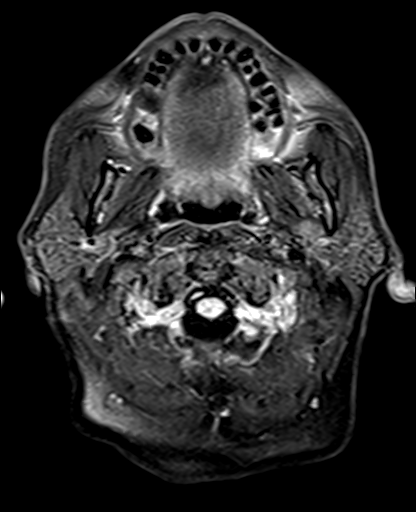
[im 29/29]
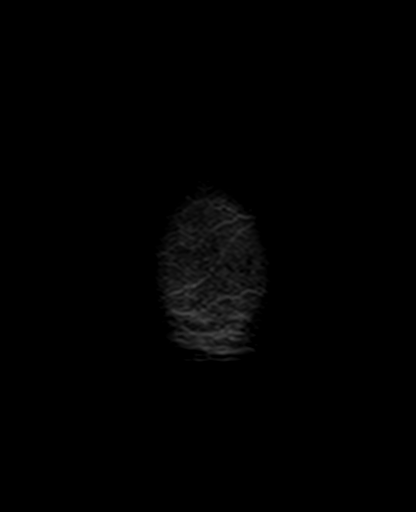

[Series 12: mag_images · axial · 3.0mm · 0.90mm/px · z∈[-72,+103]mm · 4 of 60 slices shown]
[im 1/60]
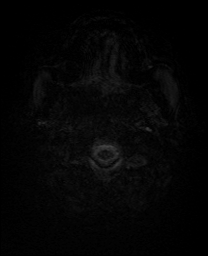
[im 20/60]
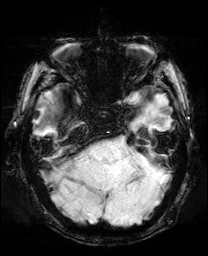
[im 40/60]
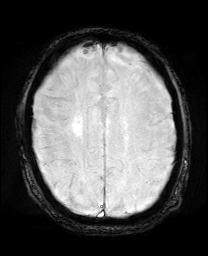
[im 60/60]
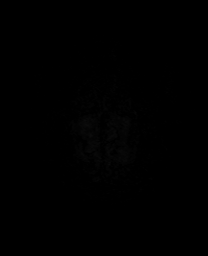

[Series 13: pha_images · axial · 3.0mm · 0.90mm/px · z∈[-72,+103]mm · 4 of 60 slices shown]
[im 1/60]
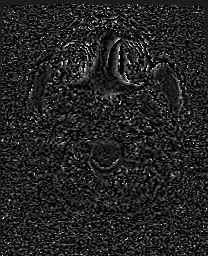
[im 20/60]
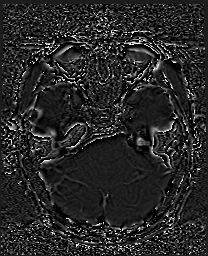
[im 40/60]
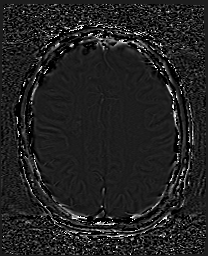
[im 60/60]
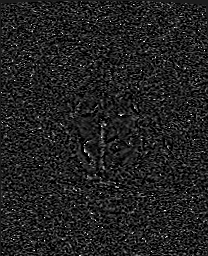

[Series 14: swi_images · axial · 3.0mm · 0.90mm/px · z∈[-72,+103]mm · 4 of 60 slices shown]
[im 1/60]
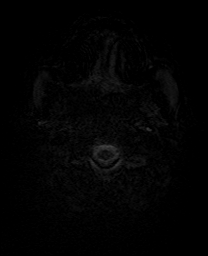
[im 20/60]
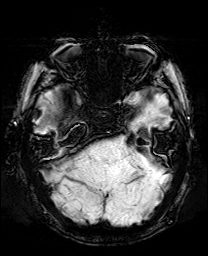
[im 40/60]
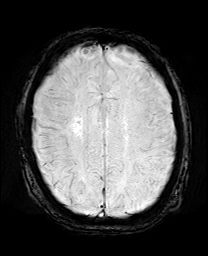
[im 60/60]
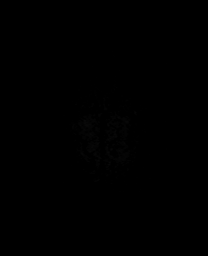

[Series 15: mip_images(sw) · axial · 24.0mm · 0.90mm/px · z∈[-62,+93]mm · 3 of 53 slices shown]
[im 1/53]
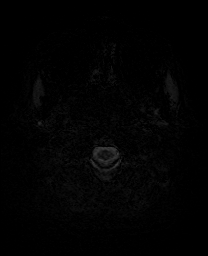
[im 27/53]
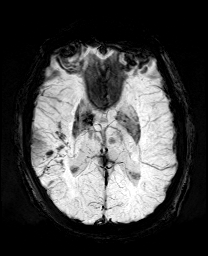
[im 53/53]
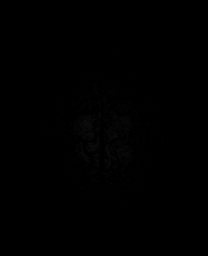

[Series 17: T2 · coronal · 5.0mm · 0.34mm/px · 2 of 32 slices shown (2 of 2)]
[im 1/32]
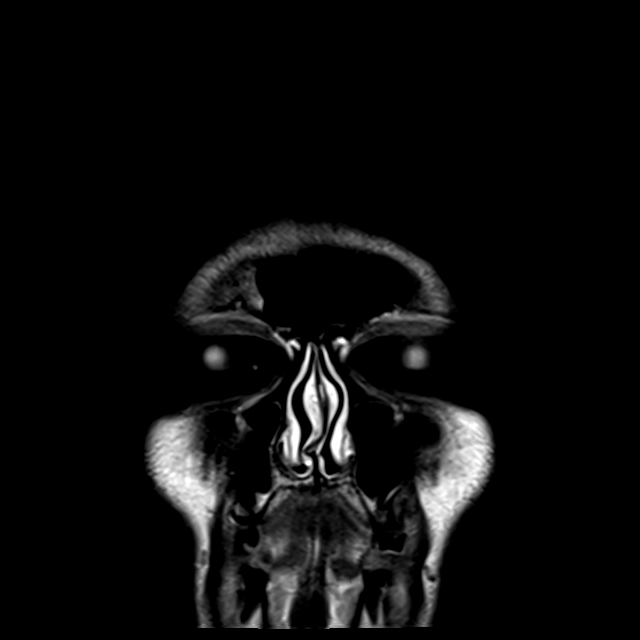
[im 32/32]
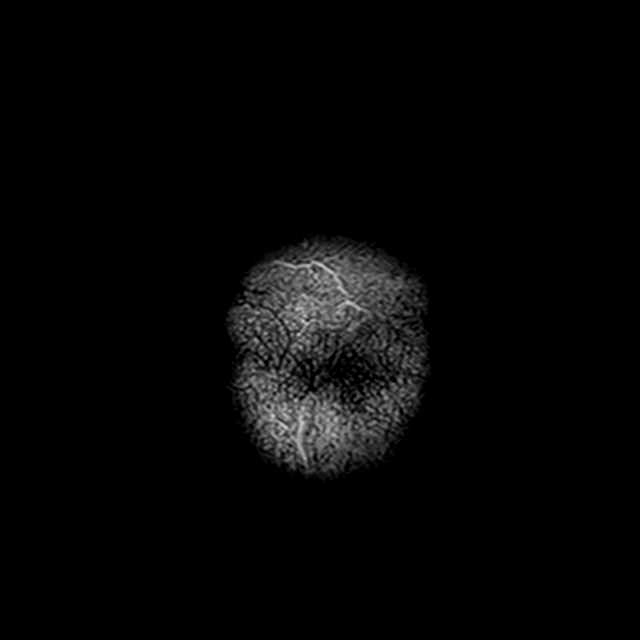

[34 of 48 positions shown; findings below may reference images not displayed]

FINDINGS: Brain: There are small foci of abnormal diffusion restriction within
the posterior left parietal lobe. Subarachnoid blood in the right
sylvian fissure is unchanged. There is multifocal hyperintense
T2-weighted signal within the white matter. Generalized volume loss
without a clear lobar predilection. The midline structures are
normal. There are old small vessel infarcts of the cerebellum and
right basal ganglia.

Vascular: Major flow voids are preserved.

Skull and upper cervical spine: Normal calvarium and skull base.
Visualized upper cervical spine and soft tissues are normal.

Sinuses/Orbits:No paranasal sinus fluid levels or advanced mucosal
thickening. No mastoid or middle ear effusion. Normal orbits.
IMPRESSION: 1. Punctate foci of acute ischemia within the posterior left
parietal lobe.
2. Unchanged subarachnoid blood in the right Sylvian fissure.
3. Chronic ischemic microangiopathy and old small vessel infarcts.

## 2022-06-15 ENCOUNTER — Inpatient Hospital Stay (HOSPITAL_COMMUNITY): Payer: Medicare Other

## 2022-06-15 ENCOUNTER — Other Ambulatory Visit: Payer: Self-pay

## 2022-06-15 ENCOUNTER — Emergency Department (HOSPITAL_COMMUNITY): Payer: Medicare Other

## 2022-06-15 ENCOUNTER — Inpatient Hospital Stay (HOSPITAL_COMMUNITY)
Admission: EM | Admit: 2022-06-15 | Discharge: 2022-06-18 | DRG: 281 | Disposition: A | Discharge status: Home Health | Payer: Medicare Other | Attending: Internal Medicine | Admitting: Internal Medicine

## 2022-06-15 ENCOUNTER — Other Ambulatory Visit (HOSPITAL_COMMUNITY): Payer: BLUE CROSS/BLUE SHIELD

## 2022-06-15 ENCOUNTER — Encounter (HOSPITAL_COMMUNITY): Payer: Self-pay

## 2022-06-15 DIAGNOSIS — E782 Mixed hyperlipidemia: Secondary | ICD-10-CM | POA: Diagnosis present

## 2022-06-15 DIAGNOSIS — L97919 Non-pressure chronic ulcer of unspecified part of right lower leg with unspecified severity: Secondary | ICD-10-CM | POA: Diagnosis present

## 2022-06-15 DIAGNOSIS — R319 Hematuria, unspecified: Secondary | ICD-10-CM | POA: Diagnosis present

## 2022-06-15 DIAGNOSIS — N138 Other obstructive and reflux uropathy: Secondary | ICD-10-CM | POA: Diagnosis present

## 2022-06-15 DIAGNOSIS — E869 Volume depletion, unspecified: Secondary | ICD-10-CM | POA: Diagnosis present

## 2022-06-15 DIAGNOSIS — I110 Hypertensive heart disease with heart failure: Secondary | ICD-10-CM | POA: Diagnosis present

## 2022-06-15 DIAGNOSIS — G4733 Obstructive sleep apnea (adult) (pediatric): Secondary | ICD-10-CM | POA: Diagnosis present

## 2022-06-15 DIAGNOSIS — I25110 Atherosclerotic heart disease of native coronary artery with unstable angina pectoris: Secondary | ICD-10-CM | POA: Diagnosis present

## 2022-06-15 DIAGNOSIS — N401 Enlarged prostate with lower urinary tract symptoms: Secondary | ICD-10-CM | POA: Diagnosis present

## 2022-06-15 DIAGNOSIS — N179 Acute kidney failure, unspecified: Secondary | ICD-10-CM | POA: Diagnosis present

## 2022-06-15 DIAGNOSIS — Z833 Family history of diabetes mellitus: Secondary | ICD-10-CM

## 2022-06-15 DIAGNOSIS — I739 Peripheral vascular disease, unspecified: Secondary | ICD-10-CM | POA: Diagnosis present

## 2022-06-15 DIAGNOSIS — E1142 Type 2 diabetes mellitus with diabetic polyneuropathy: Secondary | ICD-10-CM | POA: Diagnosis present

## 2022-06-15 DIAGNOSIS — I214 Non-ST elevation (NSTEMI) myocardial infarction: Principal | ICD-10-CM | POA: Diagnosis present

## 2022-06-15 DIAGNOSIS — I2510 Atherosclerotic heart disease of native coronary artery without angina pectoris: Secondary | ICD-10-CM | POA: Diagnosis not present

## 2022-06-15 DIAGNOSIS — Z8673 Personal history of transient ischemic attack (TIA), and cerebral infarction without residual deficits: Secondary | ICD-10-CM | POA: Diagnosis not present

## 2022-06-15 DIAGNOSIS — Z809 Family history of malignant neoplasm, unspecified: Secondary | ICD-10-CM

## 2022-06-15 DIAGNOSIS — E162 Hypoglycemia, unspecified: Secondary | ICD-10-CM | POA: Diagnosis present

## 2022-06-15 DIAGNOSIS — Z66 Do not resuscitate: Secondary | ICD-10-CM | POA: Diagnosis present

## 2022-06-15 DIAGNOSIS — E1169 Type 2 diabetes mellitus with other specified complication: Secondary | ICD-10-CM | POA: Diagnosis present

## 2022-06-15 DIAGNOSIS — L89152 Pressure ulcer of sacral region, stage 2: Secondary | ICD-10-CM | POA: Diagnosis present

## 2022-06-15 DIAGNOSIS — I10 Essential (primary) hypertension: Secondary | ICD-10-CM | POA: Diagnosis present

## 2022-06-15 DIAGNOSIS — I252 Old myocardial infarction: Secondary | ICD-10-CM

## 2022-06-15 DIAGNOSIS — Z7984 Long term (current) use of oral hypoglycemic drugs: Secondary | ICD-10-CM

## 2022-06-15 DIAGNOSIS — Z955 Presence of coronary angioplasty implant and graft: Secondary | ICD-10-CM

## 2022-06-15 DIAGNOSIS — I5022 Chronic systolic (congestive) heart failure: Secondary | ICD-10-CM | POA: Diagnosis present

## 2022-06-15 DIAGNOSIS — I255 Ischemic cardiomyopathy: Secondary | ICD-10-CM | POA: Diagnosis present

## 2022-06-15 DIAGNOSIS — I480 Paroxysmal atrial fibrillation: Secondary | ICD-10-CM | POA: Diagnosis present

## 2022-06-15 DIAGNOSIS — E785 Hyperlipidemia, unspecified: Secondary | ICD-10-CM | POA: Diagnosis present

## 2022-06-15 DIAGNOSIS — Z8249 Family history of ischemic heart disease and other diseases of the circulatory system: Secondary | ICD-10-CM

## 2022-06-15 DIAGNOSIS — Z79899 Other long term (current) drug therapy: Secondary | ICD-10-CM

## 2022-06-15 DIAGNOSIS — I4891 Unspecified atrial fibrillation: Secondary | ICD-10-CM | POA: Diagnosis present

## 2022-06-15 DIAGNOSIS — E11649 Type 2 diabetes mellitus with hypoglycemia without coma: Secondary | ICD-10-CM | POA: Diagnosis present

## 2022-06-15 DIAGNOSIS — Z7902 Long term (current) use of antithrombotics/antiplatelets: Secondary | ICD-10-CM

## 2022-06-15 DIAGNOSIS — Z89512 Acquired absence of left leg below knee: Secondary | ICD-10-CM

## 2022-06-15 DIAGNOSIS — E1151 Type 2 diabetes mellitus with diabetic peripheral angiopathy without gangrene: Secondary | ICD-10-CM | POA: Diagnosis present

## 2022-06-15 DIAGNOSIS — I5023 Acute on chronic systolic (congestive) heart failure: Secondary | ICD-10-CM | POA: Diagnosis present

## 2022-06-15 HISTORY — DX: Pure hyperglyceridemia: E78.1

## 2022-06-15 HISTORY — DX: Old myocardial infarction: I25.2

## 2022-06-15 HISTORY — DX: Obstructive sleep apnea (adult) (pediatric): G47.33

## 2022-06-15 LAB — TROPONIN I (HIGH SENSITIVITY)
Troponin I (High Sensitivity): 1420 ng/L (ref <18)
Troponin I (High Sensitivity): 2596 ng/L (ref <18)
Troponin I (High Sensitivity): 6096 ng/L (ref <18)
Troponin I (High Sensitivity): 5251 ng/L (ref <18)

## 2022-06-15 LAB — CBG MONITORING, ED
Glucose-Capillary: 113 mg/dL — ABNORMAL HIGH (ref 70–99)
Glucose-Capillary: 134 mg/dL — ABNORMAL HIGH (ref 70–99)
Glucose-Capillary: 93 mg/dL (ref 70–99)
Glucose-Capillary: 77 mg/dL (ref 70–99)
Glucose-Capillary: 166 mg/dL — ABNORMAL HIGH (ref 70–99)

## 2022-06-15 LAB — URINALYSIS, ROUTINE W REFLEX MICROSCOPIC
Bacteria, UA: NONE SEEN
Bilirubin Urine: NEGATIVE
Glucose, UA: 150 mg/dL — AB
Ketones, ur: NEGATIVE mg/dL
Nitrite: NEGATIVE
Protein, ur: 100 mg/dL — AB
RBC / HPF: >50 RBC/hpf — ABNORMAL HIGH (ref 0–5)
Specific Gravity, Urine: 1.018 (ref 1.005–1.030)
WBC, UA: >50 WBC/hpf — ABNORMAL HIGH (ref 0–5)
pH: 6 (ref 5.0–8.0)

## 2022-06-15 LAB — CBC WITH DIFFERENTIAL/PLATELET
Abs Immature Granulocytes: 0.15 10*3/uL — ABNORMAL HIGH (ref 0.00–0.07)
Basophils Absolute: 0 10*3/uL (ref 0.0–0.1)
Basophils Relative: 0 %
Eosinophils Absolute: 0 10*3/uL (ref 0.0–0.5)
Eosinophils Relative: 0 %
HCT: 49.9 % (ref 39.0–52.0)
Hemoglobin: 17.3 g/dL — ABNORMAL HIGH (ref 13.0–17.0)
Immature Granulocytes: 1 %
Lymphocytes Relative: 4 %
Lymphs Abs: 0.7 10*3/uL (ref 0.7–4.0)
MCH: 32.9 pg (ref 26.0–34.0)
MCHC: 34.7 g/dL (ref 30.0–36.0)
MCV: 94.9 fL (ref 80.0–100.0)
Monocytes Absolute: 1.1 10*3/uL — ABNORMAL HIGH (ref 0.1–1.0)
Monocytes Relative: 6 %
Neutro Abs: 17.2 10*3/uL — ABNORMAL HIGH (ref 1.7–7.7)
Neutrophils Relative %: 89 %
Platelets: 245 10*3/uL (ref 150–400)
RBC: 5.26 MIL/uL (ref 4.22–5.81)
RDW: 13.2 % (ref 11.5–15.5)
WBC: 19.2 10*3/uL — ABNORMAL HIGH (ref 4.0–10.5)
nRBC: 0 % (ref 0.0–0.2)

## 2022-06-15 LAB — COMPREHENSIVE METABOLIC PANEL
ALT: 23 U/L (ref 0–44)
AST: 44 U/L — ABNORMAL HIGH (ref 15–41)
Albumin: 4 g/dL (ref 3.5–5.0)
Alkaline Phosphatase: 69 U/L (ref 38–126)
Anion gap: 12 (ref 5–15)
BUN: 74 mg/dL — ABNORMAL HIGH (ref 8–23)
CO2: 16 mmol/L — ABNORMAL LOW (ref 22–32)
Calcium: 10.3 mg/dL (ref 8.9–10.3)
Chloride: 108 mmol/L (ref 98–111)
Creatinine, Ser: 1.7 mg/dL — ABNORMAL HIGH (ref 0.61–1.24)
GFR, Estimated: 44 mL/min — ABNORMAL LOW (ref >60)
Glucose, Bld: 94 mg/dL (ref 70–99)
Potassium: 4.7 mmol/L (ref 3.5–5.1)
Sodium: 136 mmol/L (ref 135–145)
Total Bilirubin: 0.7 mg/dL (ref 0.3–1.2)
Total Protein: 7.4 g/dL (ref 6.5–8.1)

## 2022-06-15 LAB — ECHOCARDIOGRAM COMPLETE
Height: 75.984 in
S' Lateral: 4.6 cm
Weight: 3135.82 oz

## 2022-06-15 LAB — GLUCOSE, CAPILLARY: Glucose-Capillary: 195 mg/dL — ABNORMAL HIGH (ref 70–99)

## 2022-06-15 LAB — HEMOGLOBIN A1C
Hgb A1c MFr Bld: 7.5 % — ABNORMAL HIGH (ref 4.8–5.6)
Mean Plasma Glucose: 168.55 mg/dL

## 2022-06-15 LAB — HEPARIN LEVEL (UNFRACTIONATED): Heparin Unfractionated: 0.22 IU/mL — ABNORMAL LOW (ref 0.30–0.70)

## 2022-06-15 MED ORDER — HYDRALAZINE HCL 20 MG/ML IJ SOLN: 5.0000 mg | INTRAMUSCULAR | Status: DC | PRN

## 2022-06-15 MED ORDER — BISACODYL 5 MG PO TBEC: 5.0000 mg | DELAYED_RELEASE_TABLET | Freq: Every day | ORAL | Status: DC | PRN

## 2022-06-15 MED ORDER — GABAPENTIN 300 MG PO CAPS
300.0000 mg | ORAL_CAPSULE | Freq: Two times a day (BID) | ORAL | Status: DC
  Administered 2022-06-15 – 2022-06-18 (×5): 300 mg via ORAL
  Filled 2022-06-15 (×6): qty 1

## 2022-06-15 MED ORDER — ATORVASTATIN CALCIUM 40 MG PO TABS
40.0000 mg | ORAL_TABLET | Freq: Every day | ORAL | Status: DC
  Administered 2022-06-15 – 2022-06-18 (×4): 40 mg via ORAL
  Filled 2022-06-15 (×4): qty 1

## 2022-06-15 MED ORDER — DEXTROSE 50 % IV SOLN: 1.0000 | INTRAVENOUS | Status: DC | PRN

## 2022-06-15 MED ORDER — HEPARIN (PORCINE) 25000 UT/250ML-% IV SOLN
1500.0000 [IU]/h | INTRAVENOUS | Status: DC
  Administered 2022-06-15: 1000 [IU]/h via INTRAVENOUS
  Administered 2022-06-15: 1150 [IU]/h via INTRAVENOUS
  Administered 2022-06-16: 1350 [IU]/h via INTRAVENOUS
  Filled 2022-06-15 (×3): qty 250

## 2022-06-15 MED ORDER — AMLODIPINE BESYLATE 5 MG PO TABS: 5.0000 mg | ORAL_TABLET | Freq: Every day | ORAL | Status: DC

## 2022-06-15 MED ORDER — DICLOFENAC SODIUM 1 % EX GEL
2.0000 g | Freq: Two times a day (BID) | CUTANEOUS | Status: DC | PRN
  Administered 2022-06-15: 2 g via TOPICAL
  Filled 2022-06-15: qty 100

## 2022-06-15 MED ORDER — INSULIN ASPART 100 UNIT/ML IJ SOLN
0.0000 [IU] | Freq: Three times a day (TID) | INTRAMUSCULAR | Status: DC
  Administered 2022-06-15: 2 [IU] via SUBCUTANEOUS
  Administered 2022-06-16: 1 [IU] via SUBCUTANEOUS
  Administered 2022-06-16 (×2): 2 [IU] via SUBCUTANEOUS
  Administered 2022-06-17: 3 [IU] via SUBCUTANEOUS
  Administered 2022-06-17 – 2022-06-18 (×4): 2 [IU] via SUBCUTANEOUS

## 2022-06-15 MED ORDER — OXYCODONE HCL 5 MG PO TABS
5.0000 mg | ORAL_TABLET | ORAL | Status: DC | PRN
  Administered 2022-06-15: 5 mg via ORAL
  Filled 2022-06-15: qty 1

## 2022-06-15 MED ORDER — LACTATED RINGERS IV SOLN: INTRAVENOUS | Status: AC

## 2022-06-15 MED ORDER — SODIUM CHLORIDE 0.9 % IV BOLUS
1000.0000 mL | Freq: Once | INTRAVENOUS | Status: AC
  Administered 2022-06-15: 1000 mL via INTRAVENOUS

## 2022-06-15 MED ORDER — HEPARIN BOLUS VIA INFUSION
4000.0000 [IU] | Freq: Once | INTRAVENOUS | Status: AC
  Administered 2022-06-15: 4000 [IU] via INTRAVENOUS
  Filled 2022-06-15: qty 4000

## 2022-06-15 MED ORDER — ACETAMINOPHEN 650 MG RE SUPP: 650.0000 mg | Freq: Four times a day (QID) | RECTAL | Status: DC | PRN

## 2022-06-15 MED ORDER — DOCUSATE SODIUM 100 MG PO CAPS
100.0000 mg | ORAL_CAPSULE | Freq: Two times a day (BID) | ORAL | Status: DC
  Administered 2022-06-15 – 2022-06-16 (×2): 100 mg via ORAL
  Filled 2022-06-15 (×4): qty 1

## 2022-06-15 MED ORDER — ASPIRIN 81 MG PO CHEW
324.0000 mg | CHEWABLE_TABLET | Freq: Once | ORAL | Status: AC
  Administered 2022-06-15: 324 mg via ORAL
  Filled 2022-06-15: qty 4

## 2022-06-15 MED ORDER — SODIUM CHLORIDE 0.9% FLUSH
3.0000 mL | Freq: Two times a day (BID) | INTRAVENOUS | Status: DC
  Administered 2022-06-15 – 2022-06-18 (×5): 3 mL via INTRAVENOUS

## 2022-06-15 MED ORDER — POLYETHYLENE GLYCOL 3350 17 G PO PACK: 17.0000 g | PACK | Freq: Every day | ORAL | Status: DC | PRN

## 2022-06-15 MED ORDER — METOPROLOL SUCCINATE ER 25 MG PO TB24
12.5000 mg | ORAL_TABLET | Freq: Every day | ORAL | Status: DC
  Administered 2022-06-16 – 2022-06-18 (×3): 12.5 mg via ORAL
  Filled 2022-06-15 (×3): qty 1

## 2022-06-15 MED ORDER — MORPHINE SULFATE (PF) 2 MG/ML IV SOLN
2.0000 mg | INTRAVENOUS | Status: DC | PRN
  Filled 2022-06-15: qty 1

## 2022-06-15 MED ORDER — TICAGRELOR 90 MG PO TABS
90.0000 mg | ORAL_TABLET | Freq: Two times a day (BID) | ORAL | Status: DC
  Administered 2022-06-15 – 2022-06-17 (×5): 90 mg via ORAL
  Filled 2022-06-15 (×5): qty 1

## 2022-06-15 MED ORDER — HEPARIN BOLUS VIA INFUSION
1250.0000 [IU] | Freq: Once | INTRAVENOUS | Status: AC
Start: 2022-06-15 — End: 2022-06-15
  Administered 2022-06-15: 1250 [IU] via INTRAVENOUS
  Filled 2022-06-15: qty 1250

## 2022-06-15 MED ORDER — PERFLUTREN LIPID MICROSPHERE
1.0000 mL | INTRAVENOUS | Status: AC | PRN
  Administered 2022-06-15: 2 mL via INTRAVENOUS

## 2022-06-15 MED ORDER — ACETAMINOPHEN 325 MG PO TABS
650.0000 mg | ORAL_TABLET | Freq: Four times a day (QID) | ORAL | Status: DC | PRN
  Administered 2022-06-15: 650 mg via ORAL
  Filled 2022-06-15: qty 2

## 2022-06-15 MED ORDER — FINASTERIDE 5 MG PO TABS
5.0000 mg | ORAL_TABLET | Freq: Every day | ORAL | Status: DC
  Administered 2022-06-15 – 2022-06-18 (×3): 5 mg via ORAL
  Filled 2022-06-15 (×3): qty 1

## 2022-06-15 NOTE — ED Triage Notes (Signed)
Patient found during welfare check after not answering the phone multiple times today. Per EMS patient found to have CBG of 30. Patient provided D10 through 18g IV. Patient is  "non verbal" only able to say his birthday and profanities. Patient "soiled himself" in the bed where he was found when EMS arrived.  Post D10 CBG 198. Patient unable to follow commands.

## 2022-06-15 NOTE — ED Notes (Signed)
Pt alert, NAD, calm, interactive, resps e/u, speaking in clear complete sentences, denies pain, blood and urine sent, heparin infusing, given sock for foot per request. VSS.

## 2022-06-15 NOTE — ED Notes (Signed)
2200 Heparin level erroneously clicked off early, lab and pharm notified.

## 2022-06-15 NOTE — Progress Notes (Addendum)
  Echocardiogram 2D Echocardiogram with contrast has been performed.  Merrie Roof F 06/15/2022, 5:17 PM

## 2022-06-15 NOTE — ED Provider Notes (Signed)
[facility] EMERGENCY DEPARTMENT Provider Note   CSN: [id number] Arrival date & time: 06/15/22  3244     History  Chief Complaint  Patient presents with   Hypoglycemia    Peter Yang is a 66 y.o. male.  66 yo M brought in for hypoglycemia. Apparently EMS called for welfare check as the patient hasn't answered the phone today. Found to have cbg of 30. Here only complaining of not feeling well and having chest pain. No associated dyspnea, nausea, or light headedness. Denies fevers. Has multiple skin lesions but doesn't feel there has been any changes to those. Compliant with medications. No extra doses.    Hypoglycemia   Cardiac history From cardiology note in June:  # NSTEMI/CAD # Ischemic Cardiomyopathy  -- Patient is doing well without any complaints of angina or exertional shortness of breath.  -- Most recent TTE in 01/2022 revealed moderately reduced LV function (35-40%).  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 -- Patient is CCS-I for angina, NYHA I, currently warm and well perfused, euvolemic.      Home Medications Prior to Admission medications   Medication Sig Start Date End Date Taking? Authorizing Provider  acetaminophen (TYLENOL) 325 MG tablet Take 2 tablets (650 mg total) by mouth every 6 (six) hours as needed for mild pain (or Fever >/= 101). 07/30/21   Sheikh, Omair Latif, DO  amLODipine (NORVASC) 5 MG tablet Take 5 mg by mouth daily. 03/17/21   [provider]  atorvastatin (LIPITOR) 40 MG tablet Take 40 mg by mouth daily. 03/17/21   [provider]  clopidogrel (PLAVIX) 75 MG tablet Take 1 tablet (75 mg total) by mouth daily. 08/06/21   Raiford Noble Latif, DO  diclofenac Sodium (VOLTAREN) 1 % GEL SMARTSIG:Gram(s) Topical Twice Daily PRN 07/20/21   [provider]  empagliflozin (JARDIANCE) 25 MG TABS tablet Take 25 mg by mouth daily.    [provider]  finasteride (PROSCAR) 5 MG tablet Take 5 mg by mouth daily. 03/17/21   [provider]  gabapentin (NEURONTIN) 300 MG capsule Take 300 mg by mouth. 07/20/21   [provider]  levETIRAcetam (KEPPRA) 500 MG tablet Take 1 tablet (500 mg total) by mouth 2 (two) times daily for 7 days. 07/30/21 08/06/21  Raiford Noble Latif, DO  metFORMIN (GLUCOPHAGE) 500 MG tablet Take 1,000 mg by mouth 2 (two) times daily with a meal.    [provider]  metoprolol tartrate (LOPRESSOR) 25 MG tablet Take 25 mg by mouth 2 (two) times daily. 03/17/21   [provider]  oxyCODONE (OXY IR/ROXICODONE) 5 MG immediate release tablet Take by mouth. 07/20/21   [provider]  polyethylene glycol powder (GLYCOLAX/MIRALAX) 17 GM/SCOOP powder Take 1 capful (17 )g by mouth daily as needed for mild constipation. 07/30/21   Sheikh, Georgina Quint Latif, DO  potassium chloride SA (KLOR-CON) 20 MEQ tablet Take 20 mEq by mouth daily. 03/17/21   [provider]  traZODone (DESYREL) 50 MG tablet Take 50-150 mg by mouth at bedtime as needed. 03/17/21   [provider]      Allergies    Patient has no known allergies.    Review of Systems   Review of Systems  Physical Exam Updated Vital Signs BP 96/63   Pulse 95   Temp 98.4 F (36.9 C) (Axillary)   Resp (!) 21   SpO2 97%  Physical Exam Vitals and nursing  note reviewed.  Constitutional:      Appearance: He is well-developed.  HENT:     Head: Normocephalic and atraumatic.     Mouth/Throat:     Mouth: Mucous membranes are dry.  Eyes:     Pupils: Pupils are equal, round, and reactive to light.  Cardiovascular:     Rate and Rhythm: Normal rate.  Pulmonary:     Effort: Pulmonary effort is normal. No respiratory distress.  Abdominal:     General: There is no distension.  Musculoskeletal:        General: Deformity (Left AKA) present. Normal range of motion.     Cervical back: Normal range of motion.  Skin:    General:  Skin is warm and dry.     Findings: No lesion (RLE) or rash.  Neurological:     General: No focal deficit present.     Mental Status: He is alert.     ED Results / Procedures / Treatments   Labs (all labs ordered are listed, but only abnormal results are displayed) Labs Reviewed  CBC WITH DIFFERENTIAL/PLATELET - Abnormal; Notable for the following components:      Result Value   WBC 19.2 (*)    Hemoglobin 17.3 (*)    Neutro Abs 17.2 (*)    Monocytes Absolute 1.1 (*)    Abs Immature Granulocytes 0.15 (*)    All other components within normal limits  COMPREHENSIVE METABOLIC PANEL - Abnormal; Notable for the following components:   CO2 16 (*)    BUN 74 (*)    Creatinine, Ser 1.70 (*)    AST 44 (*)    GFR, Estimated 44 (*)    All other components within normal limits  CBG MONITORING, ED - Abnormal; Notable for the following components:   Glucose-Capillary 113 (*)    All other components within normal limits  TROPONIN I (HIGH SENSITIVITY) - Abnormal; Notable for the following components:   Troponin I (High Sensitivity) 1,420 (*)    All other components within normal limits  TROPONIN I (HIGH SENSITIVITY) - Abnormal; Notable for the following components:   Troponin I (High Sensitivity) 2,596 (*)    All other components within normal limits  HEPARIN LEVEL (UNFRACTIONATED)    EKG EKG Interpretation  Date/Time:  Saturday June 15 2022 02:02:26 EDT Ventricular Rate:  92 PR Interval:    QRS Duration: 119 QT Interval:  379 QTC Calculation: 469 R Axis:   -61 Text Interpretation: Atrial fibrillation Nonspecific IVCD with LAD Inferior infarct, old Anterolateral infarct, age indeterminate mild ST depressions in V2-3 Confirmed by Merrily Pew [phone]) on 06/15/2022 3:32:48 AM   EKG Interpretation  Date/Time:  Saturday June 15 2022 03:39:36 EDT Ventricular Rate:  93 PR Interval:    QRS Duration: 117 QT Interval:  384 QTC Calculation: 478 R Axis:   -59 Text  Interpretation: Atrial fibrillation Nonspecific IVCD with LAD Probable inferior infarct, old Anterolateral infarct, age indeterminate Confirmed by Merrily Pew [phone]) on 06/15/2022 3:59:16 AM         Radiology CT Head Wo Contrast  Result Date: 06/15/2022 CLINICAL DATA:  Altered mental status EXAM: CT HEAD WITHOUT CONTRAST TECHNIQUE: Contiguous axial images were obtained from the base of the skull through the vertex without intravenous contrast. RADIATION DOSE REDUCTION: This exam was performed according to the departmental dose-optimization program which includes automated exposure control, adjustment of the mA and/or kV according to patient size and/or use of iterative reconstruction technique. COMPARISON:  None Available. FINDINGS: Brain: There  is no mass, hemorrhage or extra-axial collection. There is generalized atrophy without lobar predilection. Hypodensity of the white matter is most commonly associated with chronic microvascular disease. There are old right parietal and cerebellar infarcts. Vascular: Atherosclerotic calcification of the internal carotid arteries at the skull base. No abnormal hyperdensity of the major intracranial arteries or dural venous sinuses. Skull: The visualized skull base, calvarium and extracranial soft tissues are normal. Sinuses/Orbits: No fluid levels or advanced mucosal thickening of the visualized paranasal sinuses. No mastoid or middle ear effusion. The orbits are normal. IMPRESSION: 1. No acute intracranial abnormality. 2. 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 Electronically Signed   By: Ulyses Jarred M.D.   On: 06/15/2022 04:00   DG Chest Portable 1 View  Result Date: 06/15/2022 CLINICAL DATA:  Altered mental status EXAM: PORTABLE CHEST 1 VIEW COMPARISON:  02/14/2022 FINDINGS: The heart size and mediastinal contours are within normal limits. Both lungs are clear. The visualized skeletal structures are  unremarkable. IMPRESSION: No active disease. Electronically Signed   By: [name] M.D.   On: 06/15/2022 03:00    Procedures .Critical Care  Performed by: Merrily Pew, MD Authorized by: Merrily Pew, MD   Critical care provider statement:    Critical care time (minutes):  30   Critical care was necessary to treat or prevent imminent or life-threatening deterioration of the following conditions:  Cardiac failure, CNS failure or compromise and metabolic crisis   Critical care was time spent personally by me on the following activities:  Development of treatment plan with patient or surrogate, discussions with consultants, evaluation of patient's response to treatment, examination of patient, ordering and review of laboratory studies, ordering and review of radiographic studies, ordering and performing treatments and interventions, pulse oximetry, re-evaluation of patient's condition and review of old charts    Medications Ordered in ED Medications  heparin ADULT infusion 100 units/mL (25000 units/287m) (1,000 Units/hr Intravenous New Bag/Given 06/15/22 0404)  aspirin chewable tablet 324 mg (324 mg Oral Given 06/15/22 0400)  heparin bolus via infusion 4,000 Units (4,000 Units Intravenous Bolus from Bag 06/15/22 0405)  sodium chloride 0.9 % bolus 1,000 mL (1,000 mLs Intravenous New Bag/Given 06/15/22 0413)    ED Course/ Medical Decision Making/ A&P                           Medical Decision Making Amount and/or Complexity of Data Reviewed Labs: ordered. Radiology: ordered.  Risk OTC drugs. Prescription drug management. Decision regarding hospitalization.   Cbg here reassuring.ecg with new IVCD and mild ST changes but no STEMI. Will eval for cardiac/infectious/neurologic cuases for hypoglycemia.   Trop elevated. H/o MVCAD, will repeat ecg, give ASA and start heparin. Repeat ecg similar to first. Has some ST changes in V2-3, posterior ECG ordered.   Posterior ecg with minimal STE,  in setting of known multivessel disease and positive troponin, will d/w cardiology.   D/w Dr. 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 Does look infarcted now but would still recommend medical management. Will await second troponin and admit to medicine.   Second troponin nearly 2600, D/w Dr. RKalman Shanagain, they will see to help manage, but doubts any intervention. Recommend admit to medicine.   D/w Dr. HVelia Meyer will admit with cards consult.    Final Clinical Impression(s) / ED Diagnoses Final diagnoses:  Hypoglycemia  NSTEMI (non-ST elevated myocardial  infarction) [facility])    Rx / [place] Orders ED Discharge Orders     None         Rachit Grim, Corene Cornea, MD 06/15/22 [phone]

## 2022-06-15 NOTE — H&P (Signed)
History and Physical    Patient: Peter Yang ZOX:[id number] DOB: May 26, 1956 DOA: 06/15/2022 DOS: the patient was seen and examined on 06/15/2022 PCP: [name], MD  Patient coming from: Home - lives alone; NOK: None   Chief Complaint: hypoglycemia  HPI: Besnik Febus is a 66 y.o. male with medical history significant of BPH; DM; HTN; PVD s/p L BKA; CAD s/p stent; and HLD presenting with hypoglycemia - found basically unresponsive during welfare check.  He reports that he was feeling SOB and chest pains.  He called 911 and got an ambulance.  The symptoms this AM lasted maybe an hour.  Symptoms are still present but improved.  He was given NTG in the ambulance, he doesn't think it really helped.  He does have occasional low blood sugars at home, not really with changes in meds or diet.  He has noticed sugars as low as the 30s for a while now.  He takes [place] at night and lows always happen overnight.  He also has a painful bedsore on his bottom.  He has had nurses coming to the house but it is not improving.      ER Course:  Carryover, per Dr. Velia Meyer:  Welfare check led to finding the patient to be somnolent and confused relative to baseline mental status.  EMS noted initial blood sugar to be in the 30s, prompting administration of an unclear volume of D10.    In the ED, blood sugar improved to 113, requiring no additional intervention.  This was associated with improvement in the patient's mental status, at which time he confirmed he is a diabetic and is on insulin at home, but without any recent dose modifications thereof.   Patient conveyed that he has been experiencing some chest discomfort earlier in the day, prior to hypoglycemia, but denies any residual chest pain at this time.    Assuming work-up led to finding of elevated initial troponin of 1400, with repeat value trending up to 2500.  EKG reportedly notable for new ST depression.    EDP discussed case drips, EKG findings  with cardiology fellow, who was able to review results of most recent coronary angiography from [facility], noting multivessel disease and reportedly complete occlusion of left circumflex with collateral flow.  Cardiology to formally consult, and at this time are recommending exclusively medical management for NSTEMI.  Given aspirin as well as initiation of heparin drip.     Review of Systems: As mentioned in the history of present illness. All other systems reviewed and are negative. Past Medical History:  Diagnosis Date   BPH with obstruction/lower urinary tract symptoms 02/02/2018   Controlled type 2 diabetes mellitus with hyperglycemia, with long-term current use of insulin ([place]) 06/28/2021   Diabetic polyneuropathy associated with type 2 diabetes mellitus ([place]) 07/28/2021   Essential hypertension 01/25/2016   Hypertriglyceridemia    Mixed diabetic hyperlipidemia associated with type 2 diabetes mellitus ([place]) 07/28/2021   OSA on CPAP    TIA (transient ischemic attack) 2012   Past Surgical History:  Procedure Laterality Date   BELOW KNEE LEG AMPUTATION Left 2022   CORONARY ANGIOPLASTY WITH STENT PLACEMENT  01/2022   DES RCA   ROTATOR CUFF REPAIR Left    Social History:  reports that he has never smoked. He has never used smokeless tobacco. He reports that he does not drink alcohol and does not use drugs.  No Known Allergies  Family History  Problem Relation Age of Onset   Diabetes Mother  Hypertension Mother    Heart disease Mother    Cancer Father    Heart attack Sister    Cancer Sister    Diabetes Brother     Prior to Admission medications   Medication Sig Start Date End Date Taking? Authorizing Provider  acetaminophen (TYLENOL) 325 MG tablet Take 2 tablets (650 mg total) by mouth every 6 (six) hours as needed for mild pain (or Fever >/= 101). 07/30/21   Sheikh, Omair Latif, DO  amLODipine (NORVASC) 5 MG tablet Take 5 mg by mouth daily. 03/17/21   [provider]  atorvastatin (LIPITOR) 40 MG tablet Take 40 mg by mouth daily. 03/17/21   [provider]  clopidogrel (PLAVIX) 75 MG tablet Take 1 tablet (75 mg total) by mouth daily. 08/06/21   Raiford Noble Latif, DO  diclofenac Sodium (VOLTAREN) 1 % GEL SMARTSIG:Gram(s) Topical Twice Daily PRN 07/20/21   [provider]  empagliflozin (JARDIANCE) 25 MG TABS tablet Take 25 mg by mouth daily.    [provider]  finasteride (PROSCAR) 5 MG tablet Take 5 mg by mouth daily. 03/17/21   [provider]  gabapentin (NEURONTIN) 300 MG capsule Take 300 mg by mouth. 07/20/21   [provider]  levETIRAcetam (KEPPRA) 500 MG tablet Take 1 tablet (500 mg total) by mouth 2 (two) times daily for 7 days. 07/30/21 08/06/21  Raiford Noble Latif, DO  metFORMIN (GLUCOPHAGE) 500 MG tablet Take 1,000 mg by mouth 2 (two) times daily with a meal.    [provider]  metoprolol tartrate (LOPRESSOR) 25 MG tablet Take 25 mg by mouth 2 (two) times daily. 03/17/21   [provider]  oxyCODONE (OXY IR/ROXICODONE) 5 MG immediate release tablet Take by mouth. 07/20/21   [provider]  polyethylene glycol powder (GLYCOLAX/MIRALAX) 17 GM/SCOOP powder Take 1 capful (17 )g by mouth daily as needed for mild constipation. 07/30/21   Sheikh, Georgina Quint Latif, DO  potassium chloride SA (KLOR-CON) 20 MEQ tablet Take 20 mEq by mouth daily. 03/17/21   [provider]  traZODone (DESYREL) 50 MG tablet Take 50-150 mg by mouth at bedtime as needed. 03/17/21   [provider]    Physical Exam: Vitals:   06/15/22 1500 06/15/22 1530 06/15/22 1630 06/15/22 1742  BP: (!) 97/58 106/64 (!) 97/59   Pulse: 86 83 83   Resp: '14 15 15   '$ Temp:    99.4 F (37.4 C)  TempSrc:    Oral  SpO2: 100% 99% 98%   Weight:      Height:       General:  Appears calm and comfortable and is in NAD; disheveled, poor historian Eyes:  PERRL, EOMI, normal lids, iris ENT:  grossly normal hearing,  lips & tongue, mmm Neck:  no LAD, masses or thyromegaly Cardiovascular:  RRR, no m/r/g. No LE edema.  Respiratory:   CTA bilaterally with no wheezes/rales/rhonchi.  Normal respiratory effort. Abdomen:  soft, NT, ND Skin:  stage 2 sacral pressure ulcer; also with ulcer along R anterior lower leg with purulent drainage      Musculoskeletal:  s/p L BKA, stump is C/D/I and well healing Right Lower extremity: Leg wound as above.  Limited foot exam with no ulcerations but tons of skin sloughing and apparent poor hygiene (reports that he was given a hard shoe for the R foot at the time of the L BKA and it does not appear that this is changed frequently).   Psychiatric:  grossly normal  mood and affect, speech fluent and appropriate, AOx3 Neurologic:  CN 2-12 grossly intact, moves all extremities in coordinated fashion   Radiological Exams on Admission: Independently reviewed - see discussion in A/P where applicable  ECHOCARDIOGRAM COMPLETE  Result Date: 06/15/2022    ECHOCARDIOGRAM REPORT   Patient Name:   Farhan Jean Date of Exam: 06/15/2022 Medical Rec #:  [id number]      Height:       76.0 in Accession #:    [id number]     Weight:       196.0 lb Date of Birth:  27-May-1956       BSA:          2.196 m Patient Age:    57 years       BP:           89/58 mmHg Patient Gender: M              HR:           89 bpm. Exam Location:  Inpatient Procedure: 2D Echo, Cardiac Doppler, Color Doppler and Intracardiac            Opacification Agent Indications:    NSTEMI  History:        Patient has no prior history of Echocardiogram examinations.                 CAD; Signs/Symptoms:Chest Pain. Based on information from the                 patient, a heart cath was done at [facility] a few months ago                 with unsuccessful PCI. BKA.  Sonographer:    Merrie Roof RDCS Referring Phys: [place]  1. Left ventricular ejection fraction, by estimation, is 40 to 45%. The left ventricle has  mildly decreased function. The left ventricle demonstrates regional wall motion abnormalities (see scoring diagram/findings for description). There is mild left ventricular hypertrophy. Left ventricular diastolic function could not be evaluated. There is moderate hypokinesis of the left ventricular, basal-mid inferior wall and inferolateral wall.  2. Right ventricular systolic function is mildly reduced. The right ventricular size is normal.  3. Left atrial size was severely dilated.  4. The mitral valve is abnormal. Moderate mitral valve regurgitation.  5. The aortic valve is tricuspid. Aortic valve regurgitation is not visualized.  6. Aortic dilatation noted. There is moderate dilatation of the ascending aorta, measuring 47 mm. There is mild dilatation of the aortic root, measuring 42 mm.  7. The inferior vena cava is normal in size with greater than 50% respiratory variability, suggesting right atrial pressure of 3 mmHg. Comparison(s): Changes from prior study are noted. 02/11/2022 [facility]): LVEF 35-40%, global hypokinesis worse laterally. FINDINGS  Left Ventricle: Left ventricular ejection fraction, by estimation, is 40 to 45%. The left ventricle has mildly decreased function. The left ventricle demonstrates regional wall motion abnormalities. Moderate hypokinesis of the left ventricular, basal-mid inferior wall and inferolateral wall. Definity contrast agent was given IV to delineate the left ventricular endocardial borders. The left ventricular internal cavity size was normal in size. There is mild left ventricular hypertrophy. Left ventricular diastolic function could not be evaluated due to atrial fibrillation. Left ventricular diastolic function could not be evaluated. Right Ventricle: The right ventricular size is normal. No increase in right ventricular wall thickness. Right ventricular systolic function is mildly reduced. Left Atrium: Left atrial size  was severely dilated. Right Atrium: Right atrial size  was normal in size. Pericardium: There is no evidence of pericardial effusion. Mitral Valve: The mitral valve is abnormal. Moderate mitral valve regurgitation, with centrally-directed jet. Tricuspid Valve: The tricuspid valve is grossly normal. Tricuspid valve regurgitation is not demonstrated. Aortic Valve: The aortic valve is tricuspid. Aortic valve regurgitation is not visualized. Pulmonic Valve: The pulmonic valve was grossly normal. Pulmonic valve regurgitation is trivial. Aorta: Aortic dilatation noted. There is moderate dilatation of the ascending aorta, measuring 47 mm. There is mild dilatation of the aortic root, measuring 42 mm. Venous: The inferior vena cava is normal in size with greater than 50% respiratory variability, suggesting right atrial pressure of 3 mmHg. IAS/Shunts: The interatrial septum was not well visualized.  LEFT VENTRICLE PLAX 2D LVIDd:         6.20 cm LVIDs:         4.60 cm LV PW:         0.60 cm LV IVS:        1.10 cm LVOT diam:     2.50 cm LV SV:         40 LV SV Index:   18 LVOT Area:     4.91 cm  RIGHT VENTRICLE RV Basal diam:  3.60 cm RV S prime:     9.90 cm/s TAPSE (M-mode): 1.6 cm LEFT ATRIUM            Index        RIGHT ATRIUM           Index LA diam:      4.60 cm  2.09 cm/m   RA Area:     16.70 cm LA Vol (A2C): 48.5 ml  22.08 ml/m  RA Volume:   43.40 ml  19.76 ml/m LA Vol (A4C): 130.0 ml 59.19 ml/m  AORTIC VALVE LVOT Vmax:   55.90 cm/s LVOT Vmean:  35.700 cm/s LVOT VTI:    0.082 m  AORTA Ao Root diam: 4.20 cm Ao Asc diam:  4.70 cm  SHUNTS Systemic VTI:  0.08 m Systemic Diam: 2.50 cm Lyman Bishop MD Electronically signed by Lyman Bishop MD Signature Date/Time: 06/15/2022/5:25:47 PM    Final    CT Head Wo Contrast  Result Date: 06/15/2022 CLINICAL DATA:  Altered mental status EXAM: CT HEAD WITHOUT CONTRAST TECHNIQUE: Contiguous axial images were obtained from the base of the skull through the vertex without intravenous contrast. RADIATION DOSE REDUCTION: This exam was  performed according to the departmental dose-optimization program which includes automated exposure control, adjustment of the mA and/or kV according to patient size and/or use of iterative reconstruction technique. COMPARISON:  None Available. FINDINGS: Brain: There is no mass, hemorrhage or extra-axial collection. There is generalized atrophy without lobar predilection. Hypodensity of the white matter is most commonly associated with chronic microvascular disease. There are old right parietal and cerebellar infarcts. Vascular: Atherosclerotic calcification of the internal carotid arteries at the skull base. No abnormal hyperdensity of the major intracranial arteries or dural venous sinuses. Skull: The visualized skull base, calvarium and extracranial soft tissues are normal. Sinuses/Orbits: No fluid levels or advanced mucosal thickening of the visualized paranasal sinuses. No mastoid or middle ear effusion. The orbits are normal. IMPRESSION: 1. No acute intracranial abnormality. 2. 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 Electronically Signed   By: Ulyses Jarred M.D.   On: 06/15/2022 04:00   DG Chest Portable 1 View  Result Date: 06/15/2022 CLINICAL  DATA:  Altered mental status EXAM: PORTABLE CHEST 1 VIEW COMPARISON:  02/14/2022 FINDINGS: The heart size and mediastinal contours are within normal limits. Both lungs are clear. The visualized skeletal structures are unremarkable. IMPRESSION: No active disease. Electronically Signed   By: [name] M.D.   On: 06/15/2022 03:00    EKG: Independently reviewed.   0202 - Afib with rate 92; IVCD; nonspecific ST changes with no evidence of acute ischemia 0339 - Afib with rate 93; IVCD; worsening anterolateral ST depression 0408 - Posterior EKG - Afib with rate 92; IVCD; nonspecific ST changes    Labs on Admission: I have personally reviewed the available labs and imaging studies at the time of the  admission.  Pertinent labs:    BUN 74/Creatinine 1.70/GFR 44; 28/0.83/>60 in 01/2022 HS troponin 1420, 2596, 6096, 5251 WBC 19.2 A1c 7.5 UA: glucose 150, moderate Hgb, moderate LE, 100 protein, >50 RBC/WBC   Assessment and Plan: Principal Problem:   NSTEMI (non-ST elevated myocardial infarction) ([place]) Active Problems:   BPH with obstruction/lower urinary tract symptoms   Uncontrolled type 2 diabetes mellitus with hypoglycemia without coma, with long-term current use of insulin (HCC)   Essential hypertension   AKI (acute kidney injury) ([place])   New onset atrial fibrillation (HCC)   Dyslipidemia   PVD (peripheral vascular disease) (HCC)   Pressure ulcer of sacral region, stage 2 (HCC)   Chronic systolic CHF (congestive heart failure) ([place])   DNR (do not resuscitate)    NSTEMI -Patient with exertional chest pain/SOB  -When I was in the room with PA Barrett, the patient denied active CP; however he provided a different history to me and reported persistent discomfort -Prior cath on 02/11/22 showed multivessel CAD and at that time underwent RCA stenting -He was last seen for establishment of care with Dr. Maylene Roes on 6/27 and reported he was pain-free at that time -CXR unremarkable.   -Initial HS troponin very positive with positive delta, c/w NSTEMI.   -EKG with apparent anterolateral ST depression of uncertain duration, no apparent STEMI. -Will admit since the patient has positive troponins and/or an abnormal EKG with angina necessitating acute intervention. -Risk factor stratification with HgbA1c and FLP -Cardiology consultation requested -Patient appears likely to need invasive evaluation (cardiac catheterization) based on concerning history, pertinent symptoms, ischemic EKG, elevated troponin  -NTG for symptom relief (although there is no mortality benefit) -Started on heparin drip -Continue Brilinta  DM with hypoglycemia -Patient reports taking tresiba nightly with resultant  glucose 20-30s -He does not have [place] on his med list and this was not filled recently per the pharmacy, but his glucose was 30 with EMS -A1c is 7.5 -For now, will hold medications (including Jardiance, metformin) other than moderate-scale SSI  AKI -Given his known systolic CHF, will be judicious - 50 cc/hr x 10 hours -Hold lisinopril, Jardiance, metformin  New onset afib -Rate controlled -Currently on heparin  HTN -Continue amlodipine, Toprol XL -Will also add prn hydralazine  HLD -Continue Lipitor -Check lipids  PVD -s/p L BKA, healing well -His RLE does not have obvious foot wounds currently but he appears to be at high risk given his hygiene -He reports that PT came to do home care and it was going well but she found him profoundly hypoglycemic on one visit and it scared her so much she refused to ever come back; will order PT/OT evaluations  BPH -Continue Proscar  Wounds -Sacral pressure ulcer and RLE ulcer, both in need of attention -Wound care  consulted  Chronic systolic CHF -EF 70-17% on last echo on 02/11/22 -Appears volume depleted currently -Needs close monitoring  DNR -I have discussed code status with the patient and he would prefer to die a natural death should that situation arise. -He will need a gold out of facility DNR form at the time of discharge    Advance Care Planning:   Code Status: DNR   Consults: Cardiology; wound care; PT/OT; nutrition; TOC team  DVT Prophylaxis: Heparin  Family Communication: None present; he refused to name a NOK  Severity of Illness: The appropriate patient status for this patient is INPATIENT. Inpatient status is judged to be reasonable and necessary in order to provide the required intensity of service to ensure the patient's safety. [name] presenting symptoms, physical exam findings, and initial radiographic and laboratory data in the context of their chronic comorbidities is felt to place them at high risk  for further clinical deterioration. Furthermore, it is not anticipated that the patient will be medically stable for discharge from the hospital within 2 midnights of admission.   * I certify that at the point of admission it is my clinical judgment that the patient will require inpatient hospital care spanning beyond 2 midnights from the point of admission due to high intensity of service, high risk for further deterioration and high frequency of surveillance required.*  Author: Karmen Bongo, MD 06/15/2022 6:22 PM  For on call review [email or web address].

## 2022-06-15 NOTE — Consult Note (Signed)
Cardiology Consultation   Patient ID: Peter Yang MRN: [id number]; DOB: Oct 13, 1956  Admit date: 06/15/2022 Date of Consult: 06/15/2022  PCP:  [name], MD   [place] Providers Cardiologist:  None   Dr Blenda Bridegroom, [place], [place]  Patient Profile:   Peter Yang is a 66 y.o. male with a hx of DES RCA 01/2022 c/b pleural effusions requiring thoracentesis x2 total 3 L, DM, HTN, HLD, OSA, BPH, Decubitus, who is being seen 06/15/2022 for the evaluation of NSTEMI at the request of Dr Lorin Mercy.  History of Present Illness:   Peter Yang has been struggling ever since he left the hospital in April.  He has chronic significant dyspnea on exertion.  He can get himself into his wheelchair and go to the kitchen, but by the time he gets there he is short of breath.  By the time he gets back to the bedroom, he is having chest pain.  These symptoms have not changed in frequency or intensity recently.  He has been struggling with a decubitus ulcer, he has home health nurses coming on, but the treatments have not helped.  The site is constantly painful and burning.  He occasionally has a physical therapist, and lumbar extremity edema, but he has not noticed it.  He denies orthopnea or PND.  Today, a welfare check was called because he was not answering the phone.  He was found down on the floor and his CBG was 30.  He got D10 and his CBG improved to 198.  He was initially minimally responsive, but his level of consciousness gradually improved.  At this time, he is awake and alert and able to answer questions.  In the ER, he was complaining of not feeling well and having chest pain.  He states he did eat yesterday as usual and has been compliant with his medications, no extra doses taken.  He does not remember complaining of chest pain earlier today in the ER, and says is not having it now.  He is not having any palpitations.    Past Medical History:  Diagnosis Date   BPH  with obstruction/lower urinary tract symptoms 02/02/2018   Controlled type 2 diabetes mellitus with hyperglycemia, with long-term current use of insulin ([place]) 06/28/2021   Diabetic polyneuropathy associated with type 2 diabetes mellitus ([place]) 07/28/2021   Essential hypertension 01/25/2016   Hypertriglyceridemia    Mixed diabetic hyperlipidemia associated with type 2 diabetes mellitus ([place]) 07/28/2021   OSA on CPAP    TIA (transient ischemic attack) 2012    Past Surgical History:  Procedure Laterality Date   BELOW KNEE LEG AMPUTATION Left 2022   CORONARY ANGIOPLASTY WITH STENT PLACEMENT  01/2022   DES RCA   ROTATOR CUFF REPAIR Left      Home Medications:  Prior to Admission medications   Medication Sig Start Date End Date Taking? Authorizing Provider  acetaminophen (TYLENOL) 325 MG tablet Take 2 tablets (650 mg total) by mouth every 6 (six) hours as needed for mild pain (or Fever >/= 101). 07/30/21   Sheikh, Omair Latif, DO  amLODipine (NORVASC) 5 MG tablet Take 5 mg by mouth daily. 03/17/21   [provider]  atorvastatin (LIPITOR) 40 MG tablet Take 40 mg by mouth daily. 03/17/21   [provider]  clopidogrel (PLAVIX) 75 MG tablet Take 1 tablet (75 mg total) by mouth daily. 08/06/21   Raiford Noble Latif, DO  diclofenac Sodium (VOLTAREN) 1 % GEL SMARTSIG:Gram(s) Topical Twice Daily PRN  07/20/21   [provider]  empagliflozin (JARDIANCE) 25 MG TABS tablet Take 25 mg by mouth daily.    [provider]  finasteride (PROSCAR) 5 MG tablet Take 5 mg by mouth daily. 03/17/21   [provider]  gabapentin (NEURONTIN) 300 MG capsule Take 300 mg by mouth. 07/20/21   [provider]  levETIRAcetam (KEPPRA) 500 MG tablet Take 1 tablet (500 mg total) by mouth 2 (two) times daily for 7 days. 07/30/21 08/06/21  Raiford Noble Latif, DO  metFORMIN (GLUCOPHAGE) 500 MG tablet Take 1,000 mg by mouth 2 (two) times daily with a meal.    [provider]  metoprolol tartrate (LOPRESSOR) 25 MG tablet Take 25 mg by mouth 2 (two) times daily. 03/17/21   [provider]  oxyCODONE (OXY IR/ROXICODONE) 5 MG immediate release tablet Take by mouth. 07/20/21   [provider]  polyethylene glycol powder (GLYCOLAX/MIRALAX) 17 GM/SCOOP powder Take 1 capful (17 )g by mouth daily as needed for mild constipation. 07/30/21   Sheikh, Georgina Quint Latif, DO  potassium chloride SA (KLOR-CON) 20 MEQ tablet Take 20 mEq by mouth daily. 03/17/21   [provider]  traZODone (DESYREL) 50 MG tablet Take 50-150 mg by mouth at bedtime as needed. 03/17/21   [provider]    Inpatient Medications: Scheduled Meds:  Continuous Infusions:  heparin 1,000 Units/hr (06/15/22 0404)   PRN Meds: acetaminophen **OR** acetaminophen, dextrose  Allergies:   No Known Allergies  Social History:   Social History   Socioeconomic History   Marital status: Single    Spouse name: Not on file   Number of children: Not on file   Years of education: Not on file   Highest education level: Not on file  Occupational History   Not on file  Tobacco Use   Smoking status: Never   Smokeless tobacco: Never  Substance and Sexual Activity   Alcohol use: Never   Drug use: Never   Sexual activity: Not on file  Other Topics Concern   Not on file  Social History Narrative   Not on file   Social Determinants of Health   Financial Resource Strain: Not on file  Food Insecurity: Not on file  Transportation Needs: Not on file  Physical Activity: Not on file  Stress: Not on file  Social Connections: Not on file  Intimate Partner Violence: Not on file    Family History:   Family History  Problem Relation Age of Onset   Diabetes Mother    Hypertension Mother    Heart disease Mother    Cancer Father    Heart attack Sister    Cancer Sister    Diabetes Brother      ROS:  Please see the history of present illness.  All other ROS  reviewed and negative.     Physical Exam/Data:   Vitals:   06/15/22 0545 06/15/22 0600 06/15/22 0635 06/15/22 0851  BP: 91/64 (!) 98/57  (!) 104/91  Pulse: 91 85  93  Resp:  (!) 26  15  Temp:   98.2 F (36.8 C) 98.7 F (37.1 C)  TempSrc:    Oral  SpO2: 99% 98%  99%   No intake or output data in the 24 hours ending 06/15/22 1048    07/28/2021    4:16 AM 07/27/2021    4:16 PM  Last 3 Weights  Weight (lbs) 208 lb 202 lb 6.1 oz  Weight (kg) 94.348 kg 91.8 kg  There is no height or weight on file to calculate BMI.  General: Chronically ill-appearing male, in no acute distress HEENT: normal for age with poor dentition Neck: no JVD Vascular: No carotid bruits; Distal radial pulses 2+ bilaterally; s/p L BKA and R pedal pulses are weak Cardiac:  normal S1, S2; RRR; no murmur  Lungs:  clear to auscultation bilaterally, no wheezing, rhonchi or rales  Abd: soft, nontender, no hepatomegaly  Ext: no edema Musculoskeletal:  No deformities, BUE strength normal and equal Skin: warm and dry; skin looks very dry and is flaking, wound on right shin is bandaged and not disturbed Neuro:  CNs 2-12 intact, no focal abnormalities noted Psych:  Normal affect   EKG:  The EKG was personally reviewed and demonstrates: Atrial fibrillation, heart rate 87, no acute ischemic changes Telemetry:  Telemetry was personally reviewed and demonstrates: Atrial fibrillation, rate controlled  Relevant CV Studies:  ECHOCARDIOGRAM: 07/09/21: TTE SUMMARY Structurally normal heart for age. Normal LV size, wall thickness, wall motion and systolic function with ejection fraction 60-65% Normal left ventricular diastolic function and left atrial pressure. The right ventricle is normal in size and function. The left atrial size is normal. There is no significant valvular stenosis or regurgitation. There was insufficient TR detected to calculate RV systolic pressure. The IVC is normal in size with an inspiratory  collapse of greater then 50%, suggesting normal right atrial pressure. There is no comparison study available.  02/11/22: TTE SUMMARY The left atrium is moderately dilated. There is mild mitral regurgitation. There is trace tricuspid regurgitation. There is no pericardial effusion. LV ejection fraction = 35-40%. There is mild global hypokinesis of the left ventricle With possible mild lateral hypokinesis. Endocardium is not well visualized. Compared to the last study LV function appears to be somewhat less  CARDIAC CATHETERIZATION/PCI: 02/11/22: LCP (CC) Coronary Angiography Findings: --LM: Minimal luminal irregularities  --LAD: Mild, diffuse, non-obstructive CAD, D1 80%, D2 75% and 100% of inferior branch of D2 with L-L collaterals  --LCx: Ostial 100%, very faint and late L-L collaterals to likely OM --RCA: Prox 99%, mid 80, heavily calcified, PDA 40% --AV crossed: LVEDP: 18 mmHg --S/p PCI of RCA Intervention: Coronary angiogram showed culprit lesion in the likely the  RCA, however was heavily calcified. Additional Heparin given and loaded  with Brilinta. Inserted JR4 guide and wired across lesion with a BMW,  however had some difficulty wiring further down the vessel, therefore  inserted a Corsair (150cm) microcatheter and used a 3.0x75m compliant  balloon to trap the wire in place of the guide and advanced the  microcatheter down the vessel. Then exchanged the BMW for a Fielder XT and  was able to wire down distally, then advanced the Corsair down to the  distal vessel and exchanged the FThe Endoscopy Center IncXT wire for a Wiggle wire, then  removed the microcatheter. Then inserted a 6Fr guideliner. Nex, inserted a  2.5x132mcompliant balloon and performed PTCA at high pressure. Next,  inserted a 3.0x1292mC and performed PTCA in the proximal lesion again due  to severe calcium. Next, decided to proceed with IVL with 3.0 Shockwave  and performed 8 treatments at 10 pulses each. Next, inserted  a 3.0x18m68mience DES and deployed at above nominal pressure. Repeat angiogram showed adequate result given the heavy calcium. Next, inserted a 2.5x8mm 25mmpliant balloon and performed PTCA at the more mid lesion at very high  pressure. Next, inserted a 2.75x15mm 79mce DES, however was unable to  cross the  lesion. Given the amount of contrast and >3.5Gy of radiation,  elected to stop. ACT level at therapeutic range during case.  Pre-procedural stenosis was 99% with TIMI II flow, and post-procedural  stenosis was 0% with TIMI III flow. There is still 50% lesion in the  mid-RCA.   Comments: Cath showed severe mvCAD, not including the LAD. Heavy fluoroscopic calcium noted in the RCA, however given the severity of the lesion elected to proceed with PCI. DAPT for minimum of 1 year, then ASA indefinitely. Has already been loaded with Brilinta '180mg'$  x1 in the cath lab at time of PCI. Needs aggressive medical therapy for CAD. AV crossed, LVEDP was elevated.   Per office note 04/16/2022 If patient were to develop worsening anginal symptoms, would recommend CABG given poor targets for PCI.   Laboratory Data:  High Sensitivity Troponin:   Recent Labs  Lab 06/15/22 0209 06/15/22 0413  TROPONINIHS 1,420* 2,596*     Chemistry Recent Labs  Lab 06/15/22 0209  NA 136  K 4.7  CL 108  CO2 16*  GLUCOSE 94  BUN 74*  CREATININE 1.70*  CALCIUM 10.3  GFRNONAA 44*  ANIONGAP 12    Recent Labs  Lab 06/15/22 0209  PROT 7.4  ALBUMIN 4.0  AST 44*  ALT 23  ALKPHOS 69  BILITOT 0.7   Lipids No results for input(s): "CHOL", "TRIG", "HDL", "LABVLDL", "LDLCALC", "CHOLHDL" in the last 168 hours.  Hematology Recent Labs  Lab 06/15/22 0209  WBC 19.2*  RBC 5.26  HGB 17.3*  HCT 49.9  MCV 94.9  MCH 32.9  MCHC 34.7  RDW 13.2  PLT 245   Thyroid No results for input(s): "TSH", "FREET4" in the last 168 hours.  BNPNo results for input(s): "BNP", "PROBNP" in the last 168 hours.  DDimer No results  for input(s): "DDIMER" in the last 168 hours.   Radiology/Studies:  CT Head Wo Contrast  Result Date: 06/15/2022 CLINICAL DATA:  Altered mental status EXAM: CT HEAD WITHOUT CONTRAST TECHNIQUE: Contiguous axial images were obtained from the base of the skull through the vertex without intravenous contrast. RADIATION DOSE REDUCTION: This exam was performed according to the departmental dose-optimization program which includes automated exposure control, adjustment of the mA and/or kV according to patient size and/or use of iterative reconstruction technique. COMPARISON:  None Available. FINDINGS: Brain: There is no mass, hemorrhage or extra-axial collection. There is generalized atrophy without lobar predilection. Hypodensity of the white matter is most commonly associated with chronic microvascular disease. There are old right parietal and cerebellar infarcts. Vascular: Atherosclerotic calcification of the internal carotid arteries at the skull base. No abnormal hyperdensity of the major intracranial arteries or dural venous sinuses. Skull: The visualized skull base, calvarium and extracranial soft tissues are normal. Sinuses/Orbits: No fluid levels or advanced mucosal thickening of the visualized paranasal sinuses. No mastoid or middle ear effusion. The orbits are normal. IMPRESSION: 1. No acute intracranial abnormality. 2. 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 Electronically Signed   By: Ulyses Jarred M.D.   On: 06/15/2022 04:00   DG Chest Portable 1 View  Result Date: 06/15/2022 CLINICAL DATA:  Altered mental status EXAM: PORTABLE CHEST 1 VIEW COMPARISON:  02/14/2022 FINDINGS: The heart size and mediastinal contours are within normal limits. Both lungs are clear. The visualized skeletal structures are unremarkable. IMPRESSION: No active disease. Electronically Signed   By: [name] M.D.   On: 06/15/2022 03:00     Assessment and Plan:  NSTEMI -See cath report from [facility] above, peak troponin I at that time was 14,171 - He has significant CAD in diagonal vessels and circumflex that was to be treated medically. - Per his cardiologist, if symptoms cannot be controlled, consider CABG - He did not have any significant change in his chest pain from baseline, but did have increased demand from hypoglycemia and mental status changes today. -Start heparin, check echo, continue to cycle enzymes -MD advise if we should do cath - MD advise if we should continue Plavix  2.  Atrial fibrillation -New diagnosis, heart rate is generally controlled - Prior to admission, was on metoprolol 25 mg twice daily -We will need to consider oral anticoagulation, for now, heparin  3.  Hypoglycemic episode -Patient has no idea why this might have happened - Per IM  4.  AKI: - His last labs were during his hospitalization in April, creatinine at the time time was 0.83 -Today creatinine 1.7 - Per IM   Risk Assessment/Risk Scores:     TIMI Risk Score for Unstable Angina or Non-ST Elevation MI:   [name] TIMI risk score is 5, which indicates a 26% risk of all cause mortality, new or recurrent myocardial infarction or need for urgent revascularization in the next 14 days.  New York Heart Association (NYHA) Functional Class NYHA Class III  CHA2DS2-VASc Score = 7   This indicates a 11.2% annual risk of stroke. [name] score is based upon: CHF History: 1 HTN History: 1 Diabetes History: 1 Stroke History: 2 Vascular Disease History: 1 Age Score: 1 Gender Score: 0   For questions or updates, please contact [place] Please consult [email or web address] for contact info under    Signed, Rosaria Ferries, PA-C  06/15/2022 10:48 AM

## 2022-06-15 NOTE — Progress Notes (Signed)
ANTICOAGULATION CONSULT NOTE - Initial Consult  Pharmacy Consult for Heparin Indication: chest pain/ACS  No Known Allergies  Patient Measurements:   Heparin Dosing Weight: 88.9 kg  Vital Signs: Temp: 98.7 F (37.1 C) (08/26 0851) Temp Source: Oral (08/26 0851) BP: 104/91 (08/26 0851) Pulse Rate: 93 (08/26 0851)  Labs: Recent Labs    06/15/22 0209 06/15/22 0413  HGB 17.3*  --   HCT 49.9  --   PLT 245  --   CREATININE 1.70*  --   TROPONINIHS 1,420* 2,596*    CrCl cannot be calculated (Unknown ideal weight.).   Medical History: Past Medical History:  Diagnosis Date   BPH with obstruction/lower urinary tract symptoms 02/02/2018   Controlled type 2 diabetes mellitus with hyperglycemia, with long-term current use of insulin ([place]) 06/28/2021   Diabetic polyneuropathy associated with type 2 diabetes mellitus ([place]) 07/28/2021   Essential hypertension 01/25/2016   Hypertriglyceridemia    Mixed diabetic hyperlipidemia associated with type 2 diabetes mellitus ([place]) 07/28/2021   OSA on CPAP    TIA (transient ischemic attack) 2012    Medications:  (Not in a hospital admission)  Scheduled:  Infusions:   heparin 1,000 Units/hr (06/15/22 0404)   PRN: acetaminophen **OR** acetaminophen, dextrose  Assessment: 64 yom with a history of DES RCA 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 Patient is presenting with hypoglycemia and chest pain. Heparin per pharmacy consult placed for chest pain/ACS.  Patient is not on anticoagulation prior to arrival. Heparin infusion started at 1000 units/hr following 4000 unit IV bolus. Subsequent heparin level resulting as 0.22 which is sub-therapeutic.  No issues with infusion per RN. There was questionable hematuria noted. Patient stating that urine color is consistent with urine color prior to arrival and initiation of heparin. Discussed with MD Lorin Mercy and decision to continue for now.  Hgb  17.3; plt 245 hsTrop 2596  Goal of Therapy:  Heparin level 0.3-0.7 units/ml Monitor platelets by anticoagulation protocol: Yes   Plan:  Give IV heparin 1250 units bolus x 1 Increase heparin infusion to 1150 units/hr Check anti-Xa level at 2200 and daily while on heparin Continue to monitor H&H and platelets  Lorelei Pont, PharmD, BCPS 06/15/2022 11:14 AM ED Clinical Pharmacist -  [phone]

## 2022-06-15 NOTE — Progress Notes (Signed)
  Carryover admission to the Day Admitter.  I discussed this case with the EDP, Dr. Dayna Barker.  Per these discussions:   This is a 66 year old male with a known history of cad s/p pci with stent placement via [place] cardiology, who presented to [facility] emergency department this evening via EMS for evaluation of hypoglycemia after We will check led to finding the patient to be somnolent and confused relative to baseline mental status.  EMS noted initial blood sugar to be in the 30s, prompting administration of an unclear volume of D10.   In the ED, did blood sugar improved, noted to be 113, requiring no additional D5, D10 or any amps of D50.  This was associated with improvement in the patient's mental status, at which time he confirmed he is a diabetic and is on insulin at home, but without any recent dose modifications thereof.  Patient conveyed that he has been experiencing some chest discomfort earlier in the day, prior to hypoglycemia, but denies any residual chest pain at this time.   Assuming work-up led to finding of elevated initial troponin of 1400, with repeat value trending up to 2500.  EKG reportedly notable for new ST depression.   EDP discussed case drips, EKG findings with cardiology fellow, who was able to review results of most recent coronary angiography from [facility], noting multivessel disease and reportedly complete occlusion of left circumflex with collateral flow.  Cardiology to formally consult, and at this time are recommending exclusively medical management for NSTEMI.  Patient has received full dose aspirin in the ED as well as initiation of heparin drip.   I have placed an order for for admission to cardiac telemetry for NSTEMI.   I have placed some additional preliminary admit orders via the adult multi-morbid admission order set. I have also ordered echocardiogram.  Additionally, for further monitoring of prior hypoglycemia, have ordered CBG checks every 2  hours x2 occurrences as well as prn amp of D50 for hypoglycemia.    Babs Bertin, DO Hospitalist

## 2022-06-15 NOTE — ED Notes (Signed)
Repositioned off sacrum

## 2022-06-15 NOTE — ED Notes (Signed)
BP too low for morphine, morphine wasted. Pt asking about voltaren gel.

## 2022-06-15 NOTE — ED Notes (Signed)
Report given to [place] RN, pt moved to room 4

## 2022-06-15 NOTE — Progress Notes (Signed)
ANTICOAGULATION CONSULT NOTE - Initial Consult  Pharmacy Consult for Heparin  Indication: elevated troponin   No Known Allergies   Vital Signs: Temp: 98.4 F (36.9 C) (08/26 0210) Temp Source: Axillary (08/26 0210) BP: 94/43 (08/26 0345) Pulse Rate: 84 (08/26 0345)  Labs: Recent Labs    06/15/22 0209  HGB 17.3*  HCT 49.9  PLT 245  CREATININE 1.70*  TROPONINIHS 1,420*    CrCl cannot be calculated (Unknown ideal weight.).   Medical History: Past Medical History:  Diagnosis Date   BPH with obstruction/lower urinary tract symptoms 02/02/2018   Controlled type 2 diabetes mellitus with hyperglycemia, with long-term current use of insulin ([place]) 06/28/2021   Diabetic polyneuropathy associated with type 2 diabetes mellitus ([place]) 07/28/2021   Essential hypertension 01/25/2016   Mixed diabetic hyperlipidemia associated with type 2 diabetes mellitus ([place]) 07/28/2021     Assessment: 66 y/o M with hypoglycemia. In the ED, complaining of not feeling well and chest pain. Troponin is elevated. Starting heparin. CBC good, mild bump in Scr.   Goal of Therapy:  Heparin level 0.3-0.7 units/ml Monitor platelets by anticoagulation protocol: Yes   Plan:  Heparin 4000 units BOLUS Start heparin drip at 1000 units/hr 1200 Heparin level Daily CBC/Heparin level Monitor for bleeding  Narda Bonds, PharmD, BCPS Clinical Pharmacist Phone: [phone]

## 2022-06-16 DIAGNOSIS — N138 Other obstructive and reflux uropathy: Secondary | ICD-10-CM

## 2022-06-16 DIAGNOSIS — N401 Enlarged prostate with lower urinary tract symptoms: Secondary | ICD-10-CM

## 2022-06-16 DIAGNOSIS — Z66 Do not resuscitate: Secondary | ICD-10-CM

## 2022-06-16 LAB — CBC
HCT: 41.7 % (ref 39.0–52.0)
Hemoglobin: 14.7 g/dL (ref 13.0–17.0)
MCH: 33 pg (ref 26.0–34.0)
MCHC: 35.3 g/dL (ref 30.0–36.0)
MCV: 93.7 fL (ref 80.0–100.0)
Platelets: 158 10*3/uL (ref 150–400)
RBC: 4.45 MIL/uL (ref 4.22–5.81)
RDW: 13.4 % (ref 11.5–15.5)
WBC: 11.5 10*3/uL — ABNORMAL HIGH (ref 4.0–10.5)
nRBC: 0 % (ref 0.0–0.2)

## 2022-06-16 LAB — BASIC METABOLIC PANEL
Anion gap: 12 (ref 5–15)
Anion gap: 9 (ref 5–15)
BUN: 57 mg/dL — ABNORMAL HIGH (ref 8–23)
BUN: 55 mg/dL — ABNORMAL HIGH (ref 8–23)
CO2: 16 mmol/L — ABNORMAL LOW (ref 22–32)
CO2: 19 mmol/L — ABNORMAL LOW (ref 22–32)
Calcium: 9.2 mg/dL (ref 8.9–10.3)
Calcium: 9.2 mg/dL (ref 8.9–10.3)
Chloride: 107 mmol/L (ref 98–111)
Chloride: 106 mmol/L (ref 98–111)
Creatinine, Ser: 1.26 mg/dL — ABNORMAL HIGH (ref 0.61–1.24)
Creatinine, Ser: 1.32 mg/dL — ABNORMAL HIGH (ref 0.61–1.24)
GFR, Estimated: >60 mL/min (ref >60)
GFR, Estimated: 60 mL/min — ABNORMAL LOW (ref >60)
Glucose, Bld: 180 mg/dL — ABNORMAL HIGH (ref 70–99)
Glucose, Bld: 184 mg/dL — ABNORMAL HIGH (ref 70–99)
Potassium: 4.7 mmol/L (ref 3.5–5.1)
Potassium: 4.7 mmol/L (ref 3.5–5.1)
Sodium: 135 mmol/L (ref 135–145)
Sodium: 134 mmol/L — ABNORMAL LOW (ref 135–145)

## 2022-06-16 LAB — URINALYSIS, ROUTINE W REFLEX MICROSCOPIC
Bacteria, UA: NONE SEEN
Bilirubin Urine: NEGATIVE
Glucose, UA: >=500 mg/dL — AB
Ketones, ur: NEGATIVE mg/dL
Nitrite: NEGATIVE
Protein, ur: 100 mg/dL — AB
RBC / HPF: >50 RBC/hpf — ABNORMAL HIGH (ref 0–5)
Specific Gravity, Urine: 1.016 (ref 1.005–1.030)
WBC, UA: >50 WBC/hpf — ABNORMAL HIGH (ref 0–5)
pH: 5 (ref 5.0–8.0)

## 2022-06-16 LAB — LIPID PANEL
Cholesterol: 79 mg/dL (ref 0–200)
HDL: 36 mg/dL — ABNORMAL LOW (ref >40)
LDL Cholesterol: 25 mg/dL (ref 0–99)
Total CHOL/HDL Ratio: 2.2 RATIO
Triglycerides: 92 mg/dL (ref <150)
VLDL: 18 mg/dL (ref 0–40)

## 2022-06-16 LAB — GLUCOSE, CAPILLARY
Glucose-Capillary: 158 mg/dL — ABNORMAL HIGH (ref 70–99)
Glucose-Capillary: 171 mg/dL — ABNORMAL HIGH (ref 70–99)
Glucose-Capillary: 128 mg/dL — ABNORMAL HIGH (ref 70–99)
Glucose-Capillary: 185 mg/dL — ABNORMAL HIGH (ref 70–99)

## 2022-06-16 LAB — HEPARIN LEVEL (UNFRACTIONATED)
Heparin Unfractionated: 0.2 IU/mL — ABNORMAL LOW (ref 0.30–0.70)
Heparin Unfractionated: 0.36 IU/mL (ref 0.30–0.70)
Heparin Unfractionated: 0.25 IU/mL — ABNORMAL LOW (ref 0.30–0.70)

## 2022-06-16 MED ORDER — ASPIRIN 81 MG PO TBEC
81.0000 mg | DELAYED_RELEASE_TABLET | Freq: Every day | ORAL | Status: DC
  Administered 2022-06-16: 81 mg via ORAL
  Filled 2022-06-16 (×2): qty 1

## 2022-06-16 MED ORDER — ASPIRIN 81 MG PO CHEW
81.0000 mg | CHEWABLE_TABLET | ORAL | Status: AC
  Administered 2022-06-17: 81 mg via ORAL
  Filled 2022-06-16: qty 1

## 2022-06-16 MED ORDER — ZINC OXIDE 40 % EX OINT
TOPICAL_OINTMENT | Freq: Two times a day (BID) | CUTANEOUS | Status: DC
  Administered 2022-06-17: 1 via TOPICAL
  Filled 2022-06-16: qty 57

## 2022-06-16 MED ORDER — SODIUM CHLORIDE 0.9 % IV SOLN: INTRAVENOUS | Status: DC

## 2022-06-16 MED ORDER — ORAL CARE MOUTH RINSE: 15.0000 mL | OROMUCOSAL | Status: DC | PRN

## 2022-06-16 NOTE — Plan of Care (Signed)
Patient provided voltaren gell for knee pain.

## 2022-06-16 NOTE — Progress Notes (Signed)
ANTICOAGULATION CONSULT NOTE  Pharmacy Consult for Heparin Indication: chest pain/ACS  No Known Allergies  Patient Measurements: Height: 6' 3.98" (193 cm) Weight: 88.9 kg (195 lb 15.8 oz) IBW/kg (Calculated) : 86.76 Heparin Dosing Weight: 88.9 kg  Vital Signs: Temp: 98.5 F (36.9 C) (08/27 0011) Temp Source: Oral (08/27 0011) BP: 95/63 (08/27 0011) Pulse Rate: 87 (08/27 0011)  Labs: Recent Labs    06/15/22 0209 06/15/22 0413 06/15/22 1135 06/15/22 1522 06/15/22 2338  HGB 17.3*  --   --   --   --   HCT 49.9  --   --   --   --   PLT 245  --   --   --   --   HEPARINUNFRC  --   --  0.22*  --  0.20*  CREATININE 1.70*  --   --   --   --   TROPONINIHS 1,420* 2,596* 6,096* 5,251*  --      Estimated Creatinine Clearance: 53.2 mL/min (A) (by C-G formula based on SCr of 1.7 mg/dL (H)).   Medical History: Past Medical History:  Diagnosis Date   BPH with obstruction/lower urinary tract symptoms 02/02/2018   Controlled type 2 diabetes mellitus with hyperglycemia, with long-term current use of insulin ([place]) 06/28/2021   Diabetic polyneuropathy associated with type 2 diabetes mellitus ([place]) 07/28/2021   Essential hypertension 01/25/2016   Hypertriglyceridemia    Mixed diabetic hyperlipidemia associated with type 2 diabetes mellitus ([place]) 07/28/2021   OSA on CPAP    TIA (transient ischemic attack) 2012    Medications:  Medications Prior to Admission  Medication Sig Dispense Refill Last Dose   acetaminophen (TYLENOL) 325 MG tablet Take 2 tablets (650 mg total) by mouth every 6 (six) hours as needed for mild pain (or Fever >/= 101). 30 tablet 0 unknown   amLODipine (NORVASC) 5 MG tablet Take 5 mg by mouth daily.   06/14/2022   atorvastatin (LIPITOR) 40 MG tablet Take 40 mg by mouth daily.   06/14/2022   BRILINTA 90 MG TABS tablet Take 90 mg by mouth 2 (two) times daily.   06/14/2022 at 5:00pm   diclofenac Sodium (VOLTAREN) 1 % GEL Apply 2 g topically 2 (two) times daily as  needed (pain).   06/14/2022   empagliflozin (JARDIANCE) 25 MG TABS tablet Take 25 mg by mouth daily.   06/14/2022   finasteride (PROSCAR) 5 MG tablet Take 5 mg by mouth daily.   06/14/2022   furosemide (LASIX) 40 MG tablet Take 40 mg by mouth daily.   06/14/2022   gabapentin (NEURONTIN) 300 MG capsule Take 300 mg by mouth.   06/14/2022   lisinopril (ZESTRIL) 2.5 MG tablet Take 2.5 mg by mouth daily.   06/14/2022   metFORMIN (GLUCOPHAGE) 500 MG tablet Take 1,000 mg by mouth 2 (two) times daily with a meal.   06/14/2022   metoprolol succinate (TOPROL-XL) 25 MG 24 hr tablet Take 12.5 mg by mouth daily.   06/14/2022 at 5:00pm   oxyCODONE (OXY IR/ROXICODONE) 5 MG immediate release tablet Take by mouth.   unknown   potassium chloride SA (KLOR-CON) 20 MEQ tablet Take 20 mEq by mouth daily.   06/14/2022   levETIRAcetam (KEPPRA) 500 MG tablet Take 1 tablet (500 mg total) by mouth 2 (two) times daily for 7 days. (Patient not taking: Reported on 06/15/2022) 14 tablet 0 Completed Course    Scheduled:   amLODipine  5 mg Oral Daily   atorvastatin  40 mg Oral Daily  docusate sodium  100 mg Oral BID   finasteride  5 mg Oral Daily   gabapentin  300 mg Oral BID   insulin aspart  0-9 Units Subcutaneous TID WC   metoprolol succinate  12.5 mg Oral Daily   sodium chloride flush  3 mL Intravenous Q12H   ticagrelor  90 mg Oral BID   Infusions:   heparin 1,150 Units/hr (06/15/22 2353)   lactated ringers 50 mL/hr at 06/15/22 1858   PRN: acetaminophen **OR** acetaminophen, bisacodyl, dextrose, diclofenac Sodium, hydrALAZINE, morphine injection, oxyCODONE, polyethylene glycol  Assessment: 69 yom with a history of DES RCA 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 Patient is presenting with hypoglycemia and chest pain. Heparin per pharmacy consult placed for chest pain/ACS.  Patient is not on anticoagulation prior to arrival. Heparin infusion started at 1000 units/hr  following 4000 unit IV bolus. Subsequent heparin level resulting as 0.22 which is sub-therapeutic.  No issues with infusion per RN. There was questionable hematuria noted. Patient stating that urine color is consistent with urine color prior to arrival and initiation of heparin. Discussed with MD Lorin Mercy and decision to continue for now.  Hgb 17.3; plt 245 hsTrop 2596  8/27 AM update:  Heparin level remains low   Goal of Therapy:  Heparin level 0.3-0.7 units/ml Monitor platelets by anticoagulation protocol: Yes   Plan:  Inc heparin to 1350 units/hr 0900 heparin level  Narda Bonds, PharmD, BCPS Clinical Pharmacist Phone: [phone]

## 2022-06-16 NOTE — Evaluation (Signed)
Occupational Therapy Evaluation Patient Details Name: Peter Yang MRN: [id number] DOB: 1955/12/14 Today's Date: 06/16/2022   History of Present Illness 66 y.o. male presents to [facility] hospital on 06/15/2022 after being found unresponsive during welfare check. Pt reports having chest pain and SOB, found to be hypoglycemic. Pt noted to have ST depression and elevated troponins, admitted for NSTEMI. PMH includes BPH, DM, HTN, L BKA, CAD, HLD.   Clinical Impression   Patient PLOF includes being Independet for ADLs/IADLs except driving (which is neighbor completes for him). Patient reports that he has been using manual w/c for functional mobility for the past months 2/2 L prosthetic LE not fitting correctly per patient report. Patient completed supine to sitting EOB with Supervision, sit to stand min A and 1 side hop to [facility] with min guard using RW.  Patient would benefit from additional OT intervention to address functional deficits in order for patient to return  to PLOF.  Patient would benefit from [facility].     Recommendations for follow up therapy are one component of a multi-disciplinary discharge planning process, led by the attending physician.  Recommendations may be updated based on patient status, additional functional criteria and insurance authorization.   Follow Up Recommendations  Home health OT    Assistance Recommended at Discharge None  Patient can return home with the following Help with stairs or ramp for entrance;Assistance with cooking/housework    Functional Status Assessment  Patient has had a recent decline in their functional status and demonstrates the ability to make significant improvements in function in a reasonable and predictable amount of time.  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 and does not currently fit  well Restrictions Weight Bearing Restrictions: No      Mobility Bed Mobility Overal bed mobility: Modified Independent                  Transfers Overall transfer level: Needs assistance Equipment used: Rolling walker (2 wheels) Transfers: Sit to/from Stand Sit to Stand: Min assist                  Balance Overall balance assessment: Needs assistance Sitting-balance support: No upper extremity supported, Feet supported       Standing balance support: Bilateral upper extremity supported, Reliant on assistive device for balance                               ADL either performed or assessed with clinical judgement   ADL Overall ADL's : Independent                                             Vision Patient Visual Report: No change from baseline Vision Assessment?: No apparent visual deficits     Perception     Praxis      Pertinent Vitals/Pain Pain Assessment Pain Assessment: 0-10 Pain Score: 10-Worst pain ever Pain Location: R knee Pain Descriptors / Indicators: Aching Pain Intervention(s): Limited activity within patient's tolerance     Hand Dominance Right   Extremity/Trunk Assessment Upper Extremity Assessment Upper Extremity Assessment: Overall WFL for tasks assessed   Lower Extremity Assessment Lower Extremity Assessment: Defer to PT evaluation  Cervical / Trunk Assessment Cervical / Trunk Assessment: Normal   Communication Communication Communication: No difficulties   Cognition Arousal/Alertness: Awake/alert Behavior During Therapy: WFL for tasks assessed/performed Overall Cognitive Status: Within Functional Limits for tasks assessed                                       General Comments  tachycardic to 130 with mobility, no reports of chest pain/discomfort    Exercises     Shoulder Instructions      Home Living Family/patient expects to be discharged to:: Private  residence Living Arrangements: Alone Available Help at Discharge: Neighbor;Available PRN/intermittently (ne[place]bor helps with transporting patient) Type of Home: House Home Access: Ramped entrance     Home Layout: One level     Bathroom Shower/Tub: Tub/shower unit;Curtain   Bathroom Toilet: Standard Bathroom Accessibility: Yes How Accessible: Accessible via walker Home Equipment: [employer] (2 wheels);Cane - single point;BSC/3in1;Wheelchair - manual          Prior Functioning/Environment Prior Level of Function : Independent/Modified Independent             Mobility Comments: pt ambulates from bed to wheelchair with use of RW, typically utilizes [facility] and prosthetic to walk into bathroom, otherwise utilizing wheelchair for out of bed mobility (patient reports that his prosthetic leg has not fit correctly for the past 6 months since prior hospitalization) ADLs Comments: independent with ADLs, grocery delivery, neighbor provides transportation        OT Problem List: Decreased strength;Impaired balance (sitting and/or standing);Decreased activity tolerance      OT Treatment/Interventions:      OT Goals(Current goals can be found in the care plan section) Acute Rehab OT Goals Patient Stated Goal: to return home OT Goal Formulation: With patient Time For Goal Achievement: 06/28/22 Potential to Achieve Goals: Good  OT Frequency: Min 2X/week    Co-evaluation              AM-PAC OT "6 Clicks" Daily Activity     Outcome Measure Help from another person eating meals?: None Help from another person taking care of personal grooming?: A Little Help from another person toileting, which includes using toliet, bedpan, or urinal?: A Little Help from another person bathing (including washing, rinsing, drying)?: A Little Help from another person to put on and taking off regular upper body clothing?: A Little Help from another person to put on and taking off regular lower  body clothing?: A Little 6 Click Score: 19   End of Session Equipment Utilized During Treatment: Gait belt;Rolling walker (2 wheels) Nurse Communication: Mobility status  Activity Tolerance: Patient tolerated treatment well Patient left: in bed;with call bell/phone within reach  OT Visit Diagnosis: Muscle weakness (generalized) (M62.81);Unsteadiness on feet (R26.81)                Time: 1115-1130 OT Time Calculation (min): 15 min Charges:  OT Evaluation $OT Eval Moderate Complexity: 1 Mod Katie Jayah Balthazar OTR/L   Hadley Pen 06/16/2022, 1:16 PM

## 2022-06-16 NOTE — H&P (View-Only) (Signed)
Rounding Note    Patient Name: Peter Yang Date of Encounter: 06/16/2022  [place] Cardiologist: Dr Maylene Roes, [facility]   Subjective   Pt sees blood in his urine every am, does not see it the rest of the day.  No CP or palpitations overnight Agrees to cath, but would like sedation during procedure  Inpatient Medications    Scheduled Meds:  aspirin EC  81 mg Oral Daily   atorvastatin  40 mg Oral Daily   docusate sodium  100 mg Oral BID   finasteride  5 mg Oral Daily   gabapentin  300 mg Oral BID   insulin aspart  0-9 Units Subcutaneous TID WC   metoprolol succinate  12.5 mg Oral Daily   sodium chloride flush  3 mL Intravenous Q12H   ticagrelor  90 mg Oral BID   Continuous Infusions:  heparin 1,350 Units/hr (06/16/22 0200)   PRN Meds: acetaminophen **OR** acetaminophen, bisacodyl, dextrose, diclofenac Sodium, hydrALAZINE, morphine injection, mouth rinse, oxyCODONE, polyethylene glycol   Vital Signs    Vitals:   06/15/22 2334 06/15/22 2335 06/16/22 0011 06/16/22 0532  BP:   95/63 95/66  Pulse:   87 87  Resp: '19 20 16 16  '$ Temp:   98.5 F (36.9 C) 98.7 F (37.1 C)  TempSrc:   Oral Oral  SpO2:   100% 100%  Weight:    95.2 kg  Height:        Intake/Output Summary (Last 24 hours) at 06/16/2022 0756 Last data filed at 06/16/2022 0709 Gross per 24 hour  Intake 2061.67 ml  Output 1210 ml  Net 851.67 ml      06/16/2022    5:32 AM 06/15/2022   11:10 AM 07/28/2021    4:16 AM  Last 3 Weights  Weight (lbs) 209 lb 14.1 oz 195 lb 15.8 oz 208 lb  Weight (kg) 95.2 kg 88.9 kg 94.348 kg      Telemetry    Atrial fib, rate controlled - Personally Reviewed  ECG    None today - Personally Reviewed  Physical Exam   GEN: No acute distress.   Neck: No JVD Cardiac: Irreg R&R, no murmurs, rubs, or gallops.  Respiratory: Clear to auscultation bilaterally. GI: Soft, nontender, non-distended  MS: No edema; No deformity. Neuro:  Nonfocal  Psych: Normal  affect   Labs    High Sensitivity Troponin:   Recent Labs  Lab 06/15/22 0209 06/15/22 0413 06/15/22 1135 06/15/22 1522  TROPONINIHS 1,420* 2,596* 6,096* 5,251*     Chemistry Recent Labs  Lab 06/15/22 0209 06/16/22 0330  NA 136 135  K 4.7 4.7  CL 108 107  CO2 16* 16*  GLUCOSE 94 180*  BUN 74* 57*  CREATININE 1.70* 1.26*  CALCIUM 10.3 9.2  PROT 7.4  --   ALBUMIN 4.0  --   AST 44*  --   ALT 23  --   ALKPHOS 69  --   BILITOT 0.7  --   GFRNONAA 44* >60  ANIONGAP 12 12    Lipids  Recent Labs  Lab 06/16/22 0330  CHOL 79  TRIG 92  HDL 36*  LDLCALC 25  CHOLHDL 2.2    Hematology Recent Labs  Lab 06/15/22 0209 06/16/22 0330  WBC 19.2* 11.5*  RBC 5.26 4.45  HGB 17.3* 14.7  HCT 49.9 41.7  MCV 94.9 93.7  MCH 32.9 33.0  MCHC 34.7 35.3  RDW 13.2 13.4  PLT 245 158   Thyroid No results for input(s): "  TSH", "FREET4" in the last 168 hours.  BNPNo results for input(s): "BNP", "PROBNP" in the last 168 hours.  DDimer No results for input(s): "DDIMER" in the last 168 hours.   Radiology    ECHOCARDIOGRAM COMPLETE  Result Date: 06/15/2022    ECHOCARDIOGRAM REPORT   Patient Name:   Peter Yang Date of Exam: 06/15/2022 Medical Rec #:  [id number]      Height:       76.0 in Accession #:    [id number]     Weight:       196.0 lb Date of Birth:  Jan 26, 1956       BSA:          2.196 m Patient Age:    66 years       BP:           89/58 mmHg Patient Gender: M              HR:           89 bpm. Exam Location:  Inpatient Procedure: 2D Echo, Cardiac Doppler, Color Doppler and Intracardiac            Opacification Agent Indications:    NSTEMI  History:        Patient has no prior history of Echocardiogram examinations.                 CAD; Signs/Symptoms:Chest Pain. Based on information from the                 patient, a heart cath was done at [facility] a few months ago                 with unsuccessful PCI. BKA.  Sonographer:    Merrie Roof RDCS Referring Phys: [place]  1. Left ventricular ejection fraction, by estimation, is 40 to 45%. The left ventricle has mildly decreased function. The left ventricle demonstrates regional wall motion abnormalities (see scoring diagram/findings for description). There is mild left ventricular hypertrophy. Left ventricular diastolic function could not be evaluated. There is moderate hypokinesis of the left ventricular, basal-mid inferior wall and inferolateral wall.  2. Right ventricular systolic function is mildly reduced. The right ventricular size is normal.  3. Left atrial size was severely dilated.  4. The mitral valve is abnormal. Moderate mitral valve regurgitation.  5. The aortic valve is tricuspid. Aortic valve regurgitation is not visualized.  6. Aortic dilatation noted. There is moderate dilatation of the ascending aorta, measuring 47 mm. There is mild dilatation of the aortic root, measuring 42 mm.  7. The inferior vena cava is normal in size with greater than 50% respiratory variability, suggesting right atrial pressure of 3 mmHg. Comparison(s): Changes from prior study are noted. 02/11/2022 [facility]): LVEF 35-40%, global hypokinesis worse laterally. FINDINGS  Left Ventricle: Left ventricular ejection fraction, by estimation, is 40 to 45%. The left ventricle has mildly decreased function. The left ventricle demonstrates regional wall motion abnormalities. Moderate hypokinesis of the left ventricular, basal-mid inferior wall and inferolateral wall. Definity contrast agent was given IV to delineate the left ventricular endocardial borders. The left ventricular internal cavity size was normal in size. There is mild left ventricular hypertrophy. Left ventricular diastolic function could not be evaluated due to atrial fibrillation. Left ventricular diastolic function could not be evaluated. Right Ventricle: The right ventricular size is normal. No increase in right ventricular wall thickness. Right ventricular systolic  function is mildly reduced. Left Atrium: Left  atrial size was severely dilated. Right Atrium: Right atrial size was normal in size. Pericardium: There is no evidence of pericardial effusion. Mitral Valve: The mitral valve is abnormal. Moderate mitral valve regurgitation, with centrally-directed jet. Tricuspid Valve: The tricuspid valve is grossly normal. Tricuspid valve regurgitation is not demonstrated. Aortic Valve: The aortic valve is tricuspid. Aortic valve regurgitation is not visualized. Pulmonic Valve: The pulmonic valve was grossly normal. Pulmonic valve regurgitation is trivial. Aorta: Aortic dilatation noted. There is moderate dilatation of the ascending aorta, measuring 47 mm. There is mild dilatation of the aortic root, measuring 42 mm. Venous: The inferior vena cava is normal in size with greater than 50% respiratory variability, suggesting right atrial pressure of 3 mmHg. IAS/Shunts: The interatrial septum was not well visualized.  LEFT VENTRICLE PLAX 2D LVIDd:         6.20 cm LVIDs:         4.60 cm LV PW:         0.60 cm LV IVS:        1.10 cm LVOT diam:     2.50 cm LV SV:         40 LV SV Index:   18 LVOT Area:     4.91 cm  RIGHT VENTRICLE RV Basal diam:  3.60 cm RV S prime:     9.90 cm/s TAPSE (M-mode): 1.6 cm LEFT ATRIUM            Index        RIGHT ATRIUM           Index LA diam:      4.60 cm  2.09 cm/m   RA Area:     16.70 cm LA Vol (A2C): 48.5 ml  22.08 ml/m  RA Volume:   43.40 ml  19.76 ml/m LA Vol (A4C): 130.0 ml 59.19 ml/m  AORTIC VALVE LVOT Vmax:   55.90 cm/s LVOT Vmean:  35.700 cm/s LVOT VTI:    0.082 m  AORTA Ao Root diam: 4.20 cm Ao Asc diam:  4.70 cm  SHUNTS Systemic VTI:  0.08 m Systemic Diam: 2.50 cm Lyman Bishop MD Electronically signed by Lyman Bishop MD Signature Date/Time: 06/15/2022/5:25:47 PM    Final    CT Head Wo Contrast  Result Date: 06/15/2022 CLINICAL DATA:  Altered mental status EXAM: CT HEAD WITHOUT CONTRAST TECHNIQUE: Contiguous axial images were obtained  from the base of the skull through the vertex without intravenous contrast. RADIATION DOSE REDUCTION: This exam was performed according to the departmental dose-optimization program which includes automated exposure control, adjustment of the mA and/or kV according to patient size and/or use of iterative reconstruction technique. COMPARISON:  None Available. FINDINGS: Brain: There is no mass, hemorrhage or extra-axial collection. There is generalized atrophy without lobar predilection. Hypodensity of the white matter is most commonly associated with chronic microvascular disease. There are old right parietal and cerebellar infarcts. Vascular: Atherosclerotic calcification of the internal carotid arteries at the skull base. No abnormal hyperdensity of the major intracranial arteries or dural venous sinuses. Skull: The visualized skull base, calvarium and extracranial soft tissues are normal. Sinuses/Orbits: No fluid levels or advanced mucosal thickening of the visualized paranasal sinuses. No mastoid or middle ear effusion. The orbits are normal. IMPRESSION: 1. No acute intracranial abnormality. 2. 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 Electronically Signed   By: Ulyses Jarred M.D.   On: 06/15/2022 04:00   DG Chest Portable 1 View  Result Date: 06/15/2022  CLINICAL DATA:  Altered mental status EXAM: PORTABLE CHEST 1 VIEW COMPARISON:  02/14/2022 FINDINGS: The heart size and mediastinal contours are within normal limits. Both lungs are clear. The visualized skeletal structures are unremarkable. IMPRESSION: No active disease. Electronically Signed   By: [name] M.D.   On: 06/15/2022 03:00    Cardiac Studies   ECHO: 06/14/2022  1. Left ventricular ejection fraction, by estimation, is 40 to 45%. The  left ventricle has mildly decreased function. The left ventricle  demonstrates regional wall motion abnormalities (see scoring  diagram/findings for  description). There is mild left ventricular hypertrophy.  Left ventricular diastolic function could not be evaluated. There is moderate hypokinesis of the left ventricular, basal-mid inferior wall and inferolateral wall.   2. Right ventricular systolic function is mildly reduced. The right  ventricular size is normal.   3. Left atrial size was severely dilated.   4. The mitral valve is abnormal. Moderate mitral valve regurgitation.   5. The aortic valve is tricuspid. Aortic valve regurgitation is not  visualized.   6. Aortic dilatation noted. There is moderate dilatation of the ascending  aorta, measuring 47 mm. There is mild dilatation of the aortic root,  measuring 42 mm.   7. The inferior vena cava is normal in size with greater than 50%  respiratory variability, suggesting right atrial pressure of 3 mmHg.   Comparison(s): Changes from prior study are noted. 02/11/2022 [facility]):  LVEF was 35-40%, global hypokinesis worse laterally.   Patient Profile     66 y.o. 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 The patient understands that risks include but are not limited to stroke (1 in 1000), death (1 in 61), kidney failure [usually temporary] (1 in 500), bleeding (1 in 200), allergic reaction [possibly serious] (1 in 200).  The patient understands and is willing to proceed.    2. Atrial fib - new dx - once cath decision finalized, will know if can change heparin to DOAC  3. Hematuria - will send UA, mgt per IM  4. Hypoglycemic  episode - resolved w/ D10 - this episode raises concerns about his ability to care for himself - this and other issues per IM     For questions or updates, please contact [place] Please consult [email or web address] for contact info under        Signed, Rosaria Ferries, PA-C  06/16/2022, 7:56 AM

## 2022-06-16 NOTE — Evaluation (Signed)
Physical Therapy Evaluation Patient Details Name: Peter Yang MRN: [id number] DOB: Aug 10, 1956 Today's Date: 06/16/2022  History of Present Illness  66 y.o. male presents to [facility] hospital on 06/15/2022 after being found unresponsive during welfare check. Pt reports having chest pain and SOB, found to be hypoglycemic. Pt noted to have ST depression and elevated troponins, admitted for NSTEMI. PMH includes BPH, DM, HTN, L BKA, CAD, HLD.  Clinical Impression  Pt presents to PT with deficits in strength, power, endurance, and gait. Pt is able to ambulate for short distances with support of walker, prosthetic is not present and pt reports it has not fit well since prior CHF admission. Pt will benefit form aggressive mobilization in an effort to improve strength and endurance while also reducing falls risk. PT recommends resumption of HHPT at the time of discharge.     Recommendations for follow up therapy are one component of a multi-disciplinary discharge planning process, led by the attending physician.  Recommendations may be updated based on patient status, additional functional criteria and insurance authorization.  Follow Up Recommendations Home health PT      Assistance Recommended at Discharge PRN  Patient can return home with the following  A little help with bathing/dressing/bathroom;Assist for transportation;Help with stairs or ramp for entrance    Equipment Recommendations None recommended by PT  Recommendations for Other Services       Functional Status Assessment Patient has had a recent decline in their functional status and demonstrates the ability to make significant improvements in function in a reasonable and predictable amount of time.     Precautions / Restrictions Precautions Precautions: Fall Precaution Comments: L BKA, 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 and does not currently fit well Restrictions Weight Bearing Restrictions: No      Mobility  Bed Mobility Overal bed mobility:  Modified Independent                  Transfers Overall transfer level: Needs assistance Equipment used: Rolling walker (2 wheels) Transfers: Sit to/from Stand Sit to Stand: Min guard                Ambulation/Gait Ambulation/Gait assistance: Min guard Gait Distance (Feet): 20 Feet Assistive device: Rolling walker (2 wheels) Gait Pattern/deviations:  (hop-to gait) Gait velocity: reduced Gait velocity interpretation: <1.31 ft/sec, indicative of household ambulator   General Gait Details: limited foot clearance of RLE with fatigue, difficulty hopping backward, instead shuffling posteriorly  Stairs            Wheelchair Mobility    Modified Rankin (Stroke Patients Only)       Balance Overall balance assessment: Needs assistance Sitting-balance support: No upper extremity supported, Feet supported Sitting balance-Leahy Scale: Good     Standing balance support: Bilateral upper extremity supported, Reliant on assistive device for balance Standing balance-Leahy Scale: Poor                               Pertinent Vitals/Pain Pain Assessment Pain Assessment: 0-10 Pain Score: 6  Pain Location: R knee Pain Descriptors / Indicators: Aching Pain Intervention(s): Monitored during session    Home Living Family/patient expects to be discharged to:: Private residence Living Arrangements: Alone Available Help at Discharge: Neighbor;Available PRN/intermittently Type of Home: House Home Access: Ramped entrance       Home Layout: One level Home Equipment: Conservation officer, nature (2 wheels);Cane - single point;BSC/3in1;Wheelchair - manual      Prior Function Prior Level  of Function : Independent/Modified Independent             Mobility Comments: pt ambulates from bed to wheelchair with use of RW, typically utilizes [facility] and prosthetic to walk into bathroom, otherwise utilizing wheelchair for out of bed mobility ADLs Comments: independent with ADLs,  grocery delivery, neighbor provides transportation     Hand Dominance   Dominant Hand: Right    Extremity/Trunk Assessment   Upper Extremity Assessment Upper Extremity Assessment: Overall WFL for tasks assessed    Lower Extremity Assessment Lower Extremity Assessment: Generalized weakness    Cervical / Trunk Assessment Cervical / Trunk Assessment: Normal  Communication   Communication: No difficulties  Cognition Arousal/Alertness: Awake/alert Behavior During Therapy: WFL for tasks assessed/performed Overall Cognitive Status: Within Functional Limits for tasks assessed                                          General Comments General comments (skin integrity, edema, etc.): tachycardic to 130 with mobility, no reports of chest pain/discomfort    Exercises     Assessment/Plan    PT Assessment Patient needs continued PT services  PT Problem List Decreased strength;Decreased activity tolerance;Decreased balance;Decreased mobility       PT Treatment Interventions DME instruction;Gait training;Functional mobility training;Therapeutic activities;Therapeutic exercise;Balance training;Neuromuscular re-education;Patient/family education    PT Goals (Current goals can be found in the Care Plan section)  Acute Rehab PT Goals Patient Stated Goal: to return home PT Goal Formulation: With patient Time For Goal Achievement: 06/30/22 Potential to Achieve Goals: Good    Frequency Min 3X/week     Co-evaluation               AM-PAC PT "6 Clicks" Mobility  Outcome Measure Help needed turning from your back to your side while in a flat bed without using bedrails?: None Help needed moving from lying on your back to sitting on the side of a flat bed without using bedrails?: None Help needed moving to and from a bed to a chair (including a wheelchair)?: A Little Help needed standing up from a chair using your arms (e.g., wheelchair or bedside chair)?: A  Little Help needed to walk in hospital room?: A Little Help needed climbing 3-5 steps with a railing? : Total 6 Click Score: 18    End of Session   Activity Tolerance: Patient tolerated treatment well Patient left: in bed;with call bell/phone within reach Nurse Communication: Mobility status PT Visit Diagnosis: Other abnormalities of gait and mobility (R26.89);Muscle weakness (generalized) (M62.81)    Time: 4332-9518 PT Time Calculation (min) (ACUTE ONLY): 28 min   Charges:   PT Evaluation $PT Eval Low Complexity: [place], PT, DPT Acute Rehabilitation Office [phone]   Zenaida Niece 06/16/2022, 9:44 AM

## 2022-06-16 NOTE — Progress Notes (Signed)
Rounding Note    Patient Name: Peter Yang Date of Encounter: 06/16/2022  [place] Cardiologist: Dr Maylene Roes, [facility]   Subjective   Pt sees blood in his urine every am, does not see it the rest of the day.  No CP or palpitations overnight Agrees to cath, but would like sedation during procedure  Inpatient Medications    Scheduled Meds:  aspirin EC  81 mg Oral Daily   atorvastatin  40 mg Oral Daily   docusate sodium  100 mg Oral BID   finasteride  5 mg Oral Daily   gabapentin  300 mg Oral BID   insulin aspart  0-9 Units Subcutaneous TID WC   metoprolol succinate  12.5 mg Oral Daily   sodium chloride flush  3 mL Intravenous Q12H   ticagrelor  90 mg Oral BID   Continuous Infusions:  heparin 1,350 Units/hr (06/16/22 0200)   PRN Meds: acetaminophen **OR** acetaminophen, bisacodyl, dextrose, diclofenac Sodium, hydrALAZINE, morphine injection, mouth rinse, oxyCODONE, polyethylene glycol   Vital Signs    Vitals:   06/15/22 2334 06/15/22 2335 06/16/22 0011 06/16/22 0532  BP:   95/63 95/66  Pulse:   87 87  Resp: '19 20 16 16  '$ Temp:   98.5 F (36.9 C) 98.7 F (37.1 C)  TempSrc:   Oral Oral  SpO2:   100% 100%  Weight:    95.2 kg  Height:        Intake/Output Summary (Last 24 hours) at 06/16/2022 0756 Last data filed at 06/16/2022 0709 Gross per 24 hour  Intake 2061.67 ml  Output 1210 ml  Net 851.67 ml      06/16/2022    5:32 AM 06/15/2022   11:10 AM 07/28/2021    4:16 AM  Last 3 Weights  Weight (lbs) 209 lb 14.1 oz 195 lb 15.8 oz 208 lb  Weight (kg) 95.2 kg 88.9 kg 94.348 kg      Telemetry    Atrial fib, rate controlled - Personally Reviewed  ECG    None today - Personally Reviewed  Physical Exam   GEN: No acute distress.   Neck: No JVD Cardiac: Irreg R&R, no murmurs, rubs, or gallops.  Respiratory: Clear to auscultation bilaterally. GI: Soft, nontender, non-distended  MS: No edema; No deformity. Neuro:  Nonfocal  Psych: Normal  affect   Labs    High Sensitivity Troponin:   Recent Labs  Lab 06/15/22 0209 06/15/22 0413 06/15/22 1135 06/15/22 1522  TROPONINIHS 1,420* 2,596* 6,096* 5,251*     Chemistry Recent Labs  Lab 06/15/22 0209 06/16/22 0330  NA 136 135  K 4.7 4.7  CL 108 107  CO2 16* 16*  GLUCOSE 94 180*  BUN 74* 57*  CREATININE 1.70* 1.26*  CALCIUM 10.3 9.2  PROT 7.4  --   ALBUMIN 4.0  --   AST 44*  --   ALT 23  --   ALKPHOS 69  --   BILITOT 0.7  --   GFRNONAA 44* >60  ANIONGAP 12 12    Lipids  Recent Labs  Lab 06/16/22 0330  CHOL 79  TRIG 92  HDL 36*  LDLCALC 25  CHOLHDL 2.2    Hematology Recent Labs  Lab 06/15/22 0209 06/16/22 0330  WBC 19.2* 11.5*  RBC 5.26 4.45  HGB 17.3* 14.7  HCT 49.9 41.7  MCV 94.9 93.7  MCH 32.9 33.0  MCHC 34.7 35.3  RDW 13.2 13.4  PLT 245 158   Thyroid No results for input(s): "  TSH", "FREET4" in the last 168 hours.  BNPNo results for input(s): "BNP", "PROBNP" in the last 168 hours.  DDimer No results for input(s): "DDIMER" in the last 168 hours.   Radiology    ECHOCARDIOGRAM COMPLETE  Result Date: 06/15/2022    ECHOCARDIOGRAM REPORT   Patient Name:   Peter Yang Date of Exam: 06/15/2022 Medical Rec #:  [id number]      Height:       76.0 in Accession #:    [id number]     Weight:       196.0 lb Date of Birth:  05/29/1956       BSA:          2.196 m Patient Age:    66 years       BP:           89/58 mmHg Patient Gender: M              HR:           89 bpm. Exam Location:  Inpatient Procedure: 2D Echo, Cardiac Doppler, Color Doppler and Intracardiac            Opacification Agent Indications:    NSTEMI  History:        Patient has no prior history of Echocardiogram examinations.                 CAD; Signs/Symptoms:Chest Pain. Based on information from the                 patient, a heart cath was done at [facility] a few months ago                 with unsuccessful PCI. BKA.  Sonographer:    Merrie Roof RDCS Referring Phys: [place]  1. Left ventricular ejection fraction, by estimation, is 40 to 45%. The left ventricle has mildly decreased function. The left ventricle demonstrates regional wall motion abnormalities (see scoring diagram/findings for description). There is mild left ventricular hypertrophy. Left ventricular diastolic function could not be evaluated. There is moderate hypokinesis of the left ventricular, basal-mid inferior wall and inferolateral wall.  2. Right ventricular systolic function is mildly reduced. The right ventricular size is normal.  3. Left atrial size was severely dilated.  4. The mitral valve is abnormal. Moderate mitral valve regurgitation.  5. The aortic valve is tricuspid. Aortic valve regurgitation is not visualized.  6. Aortic dilatation noted. There is moderate dilatation of the ascending aorta, measuring 47 mm. There is mild dilatation of the aortic root, measuring 42 mm.  7. The inferior vena cava is normal in size with greater than 50% respiratory variability, suggesting right atrial pressure of 3 mmHg. Comparison(s): Changes from prior study are noted. 02/11/2022 [facility]): LVEF 35-40%, global hypokinesis worse laterally. FINDINGS  Left Ventricle: Left ventricular ejection fraction, by estimation, is 40 to 45%. The left ventricle has mildly decreased function. The left ventricle demonstrates regional wall motion abnormalities. Moderate hypokinesis of the left ventricular, basal-mid inferior wall and inferolateral wall. Definity contrast agent was given IV to delineate the left ventricular endocardial borders. The left ventricular internal cavity size was normal in size. There is mild left ventricular hypertrophy. Left ventricular diastolic function could not be evaluated due to atrial fibrillation. Left ventricular diastolic function could not be evaluated. Right Ventricle: The right ventricular size is normal. No increase in right ventricular wall thickness. Right ventricular systolic  function is mildly reduced. Left Atrium: Left  atrial size was severely dilated. Right Atrium: Right atrial size was normal in size. Pericardium: There is no evidence of pericardial effusion. Mitral Valve: The mitral valve is abnormal. Moderate mitral valve regurgitation, with centrally-directed jet. Tricuspid Valve: The tricuspid valve is grossly normal. Tricuspid valve regurgitation is not demonstrated. Aortic Valve: The aortic valve is tricuspid. Aortic valve regurgitation is not visualized. Pulmonic Valve: The pulmonic valve was grossly normal. Pulmonic valve regurgitation is trivial. Aorta: Aortic dilatation noted. There is moderate dilatation of the ascending aorta, measuring 47 mm. There is mild dilatation of the aortic root, measuring 42 mm. Venous: The inferior vena cava is normal in size with greater than 50% respiratory variability, suggesting right atrial pressure of 3 mmHg. IAS/Shunts: The interatrial septum was not well visualized.  LEFT VENTRICLE PLAX 2D LVIDd:         6.20 cm LVIDs:         4.60 cm LV PW:         0.60 cm LV IVS:        1.10 cm LVOT diam:     2.50 cm LV SV:         40 LV SV Index:   18 LVOT Area:     4.91 cm  RIGHT VENTRICLE RV Basal diam:  3.60 cm RV S prime:     9.90 cm/s TAPSE (M-mode): 1.6 cm LEFT ATRIUM            Index        RIGHT ATRIUM           Index LA diam:      4.60 cm  2.09 cm/m   RA Area:     16.70 cm LA Vol (A2C): 48.5 ml  22.08 ml/m  RA Volume:   43.40 ml  19.76 ml/m LA Vol (A4C): 130.0 ml 59.19 ml/m  AORTIC VALVE LVOT Vmax:   55.90 cm/s LVOT Vmean:  35.700 cm/s LVOT VTI:    0.082 m  AORTA Ao Root diam: 4.20 cm Ao Asc diam:  4.70 cm  SHUNTS Systemic VTI:  0.08 m Systemic Diam: 2.50 cm Lyman Bishop MD Electronically signed by Lyman Bishop MD Signature Date/Time: 06/15/2022/5:25:47 PM    Final    CT Head Wo Contrast  Result Date: 06/15/2022 CLINICAL DATA:  Altered mental status EXAM: CT HEAD WITHOUT CONTRAST TECHNIQUE: Contiguous axial images were obtained  from the base of the skull through the vertex without intravenous contrast. RADIATION DOSE REDUCTION: This exam was performed according to the departmental dose-optimization program which includes automated exposure control, adjustment of the mA and/or kV according to patient size and/or use of iterative reconstruction technique. COMPARISON:  None Available. FINDINGS: Brain: There is no mass, hemorrhage or extra-axial collection. There is generalized atrophy without lobar predilection. Hypodensity of the white matter is most commonly associated with chronic microvascular disease. There are old right parietal and cerebellar infarcts. Vascular: Atherosclerotic calcification of the internal carotid arteries at the skull base. No abnormal hyperdensity of the major intracranial arteries or dural venous sinuses. Skull: The visualized skull base, calvarium and extracranial soft tissues are normal. Sinuses/Orbits: No fluid levels or advanced mucosal thickening of the visualized paranasal sinuses. No mastoid or middle ear effusion. The orbits are normal. IMPRESSION: 1. No acute intracranial abnormality. 2. 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 Electronically Signed   By: Ulyses Jarred M.D.   On: 06/15/2022 04:00   DG Chest Portable 1 View  Result Date: 06/15/2022  CLINICAL DATA:  Altered mental status EXAM: PORTABLE CHEST 1 VIEW COMPARISON:  02/14/2022 FINDINGS: The heart size and mediastinal contours are within normal limits. Both lungs are clear. The visualized skeletal structures are unremarkable. IMPRESSION: No active disease. Electronically Signed   By: [name] M.D.   On: 06/15/2022 03:00    Cardiac Studies   ECHO: 06/14/2022  1. Left ventricular ejection fraction, by estimation, is 40 to 45%. The  left ventricle has mildly decreased function. The left ventricle  demonstrates regional wall motion abnormalities (see scoring  diagram/findings for  description). There is mild left ventricular hypertrophy.  Left ventricular diastolic function could not be evaluated. There is moderate hypokinesis of the left ventricular, basal-mid inferior wall and inferolateral wall.   2. Right ventricular systolic function is mildly reduced. The right  ventricular size is normal.   3. Left atrial size was severely dilated.   4. The mitral valve is abnormal. Moderate mitral valve regurgitation.   5. The aortic valve is tricuspid. Aortic valve regurgitation is not  visualized.   6. Aortic dilatation noted. There is moderate dilatation of the ascending  aorta, measuring 47 mm. There is mild dilatation of the aortic root,  measuring 42 mm.   7. The inferior vena cava is normal in size with greater than 50%  respiratory variability, suggesting right atrial pressure of 3 mmHg.   Comparison(s): Changes from prior study are noted. 02/11/2022 [facility]):  LVEF was 35-40%, global hypokinesis worse laterally.   Patient Profile     67 y.o. 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 The patient understands that risks include but are not limited to stroke (1 in 1000), death (1 in 50), kidney failure [usually temporary] (1 in 500), bleeding (1 in 200), allergic reaction [possibly serious] (1 in 200).  The patient understands and is willing to proceed.    2. Atrial fib - new dx - once cath decision finalized, will know if can change heparin to DOAC  3. Hematuria - will send UA, mgt per IM  4. Hypoglycemic  episode - resolved w/ D10 - this episode raises concerns about his ability to care for himself - this and other issues per IM     For questions or updates, please contact [place] Please consult [email or web address] for contact info under        Signed, Rosaria Ferries, PA-C  06/16/2022, 7:56 AM

## 2022-06-16 NOTE — Progress Notes (Addendum)
ANTICOAGULATION CONSULT NOTE  Pharmacy Consult for Heparin Indication: chest pain/ACS  No Known Allergies  Patient Measurements: Height: 6' 3.98" (193 cm) Weight: 95.2 kg (209 lb 14.1 oz) IBW/kg (Calculated) : 86.76 Heparin Dosing Weight: 88.9 kg  Vital Signs: Temp: 98.7 F (37.1 C) (08/27 1006) Temp Source: Oral (08/27 1006) BP: 90/62 (08/27 1012) Pulse Rate: 83 (08/27 0800)  Labs: Recent Labs    06/15/22 0209 06/15/22 0413 06/15/22 1135 06/15/22 1522 06/15/22 2338 06/16/22 0330 06/16/22 0916  HGB 17.3*  --   --   --   --  14.7  --   HCT 49.9  --   --   --   --  41.7  --   PLT 245  --   --   --   --  158  --   HEPARINUNFRC  --   --  0.22*  --  0.20*  --  0.36  CREATININE 1.70*  --   --   --   --  1.26*  --   TROPONINIHS 1,420* 2,596* 6,096* 5,251*  --   --   --     Estimated Creatinine Clearance: 71.8 mL/min (A) (by C-G formula based on SCr of 1.26 mg/dL (H)).   Medical History: Past Medical History:  Diagnosis Date   BPH with obstruction/lower urinary tract symptoms 02/02/2018   Controlled type 2 diabetes mellitus with hyperglycemia, with long-term current use of insulin ([place]) 06/28/2021   Diabetic polyneuropathy associated with type 2 diabetes mellitus ([place]) 07/28/2021   Essential hypertension 01/25/2016   Hypertriglyceridemia    Mixed diabetic hyperlipidemia associated with type 2 diabetes mellitus ([place]) 07/28/2021   OSA on CPAP    TIA (transient ischemic attack) 2012    Medications:  Medications Prior to Admission  Medication Sig Dispense Refill Last Dose   acetaminophen (TYLENOL) 325 MG tablet Take 2 tablets (650 mg total) by mouth every 6 (six) hours as needed for mild pain (or Fever >/= 101). 30 tablet 0 unknown   amLODipine (NORVASC) 5 MG tablet Take 5 mg by mouth daily.   06/14/2022   aspirin EC 81 MG tablet Take 81 mg by mouth daily. Swallow whole.      atorvastatin (LIPITOR) 40 MG tablet Take 40 mg by mouth daily.   06/14/2022   BRILINTA 90 MG  TABS tablet Take 90 mg by mouth 2 (two) times daily.   06/14/2022 at 5:00pm   diclofenac Sodium (VOLTAREN) 1 % GEL Apply 2 g topically 2 (two) times daily as needed (pain).   06/14/2022   empagliflozin (JARDIANCE) 25 MG TABS tablet Take 25 mg by mouth daily.   06/14/2022   finasteride (PROSCAR) 5 MG tablet Take 5 mg by mouth daily.   06/14/2022   furosemide (LASIX) 40 MG tablet Take 40 mg by mouth daily.   06/14/2022   gabapentin (NEURONTIN) 300 MG capsule Take 300 mg by mouth.   06/14/2022   lisinopril (ZESTRIL) 2.5 MG tablet Take 2.5 mg by mouth daily.   06/14/2022   metFORMIN (GLUCOPHAGE) 500 MG tablet Take 1,000 mg by mouth 2 (two) times daily with a meal.   06/14/2022   metoprolol succinate (TOPROL-XL) 25 MG 24 hr tablet Take 12.5 mg by mouth daily.   06/14/2022 at 5:00pm   oxyCODONE (OXY IR/ROXICODONE) 5 MG immediate release tablet Take by mouth.   unknown   potassium chloride SA (KLOR-CON) 20 MEQ tablet Take 20 mEq by mouth daily.   06/14/2022   levETIRAcetam (KEPPRA) 500 MG tablet  Take 1 tablet (500 mg total) by mouth 2 (two) times daily for 7 days. (Patient not taking: Reported on 06/15/2022) 14 tablet 0 Completed Course    Scheduled:   aspirin EC  81 mg Oral Daily   atorvastatin  40 mg Oral Daily   docusate sodium  100 mg Oral BID   finasteride  5 mg Oral Daily   gabapentin  300 mg Oral BID   insulin aspart  0-9 Units Subcutaneous TID WC   liver oil-zinc oxide   Topical BID   metoprolol succinate  12.5 mg Oral Daily   sodium chloride flush  3 mL Intravenous Q12H   ticagrelor  90 mg Oral BID   Infusions:   heparin 1,350 Units/hr (06/16/22 0200)   PRN: acetaminophen **OR** acetaminophen, bisacodyl, dextrose, diclofenac Sodium, hydrALAZINE, morphine injection, mouth rinse, oxyCODONE, polyethylene glycol  Assessment: 51 yom with a history of DES RCA 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 Patient is presenting with hypoglycemia and chest  pain. Heparin per pharmacy consult placed for chest pain/ACS.  Heparin level 0.36 on drip rate 1350 units/hr therapeutic. First therapeutic level. Hgb 14.6 and plt 158. There has been questionable hematuria noted. Patient stating that urine color is consistent with urine color prior to arrival and initiation of heparin. Previously discussed with MD Lorin Mercy and decision to continue for now. UA to be obtained. Plan for cath.    Goal of Therapy:  Heparin level 0.3-0.7 units/ml Monitor platelets by anticoagulation protocol: Yes   Plan:  Continue heparin infusion at 1350 units/hr  Obtain heparin level in 8 hours  Monitor daily heparin level and CBC Continue to monitor H&H     [name], PharmD PGY1 Pharmacy Resident   06/16/2022 10:48 AM

## 2022-06-16 NOTE — Consult Note (Signed)
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 Wyline Copas this am) RLE full thickness wound with 80% red, 20% yellow tissue. Wound bed:As noted above Drainage (amount, consistency, odor) small Periwound:as noted above Dressing procedure/placement/frequency: A topical POC has been provided for Bedside RN supported with guidance to turn and reposition, apply Desitin ointment to gluteal cleft and buttock, cover sacral DPTI with silicone foam dressings and assess each shift. Heels are to be floated. The RLE wound will be cleansed and dressed with a xeroform gauze and changed daily.  [facility] nursing team will not follow, but will remain available to this patient, the nursing and medical teams.  Please re-consult if needed.  Thank you for inviting [place] to participate in this patient's Plan of Care.  Maudie Flakes, MSN, RN, CNS, [place], Serita Grammes, [employer], [employer] phone:  [phone]

## 2022-06-16 NOTE — Progress Notes (Signed)
ANTICOAGULATION CONSULT NOTE  Pharmacy Consult for Heparin Indication: chest pain/ACS  No Known Allergies  Patient Measurements: Height: 6' 3.98" (193 cm) Weight: 95.2 kg (209 lb 14.1 oz) IBW/kg (Calculated) : 86.76 Heparin Dosing Weight: 88.9 kg  Vital Signs: Temp: 98.5 F (36.9 C) (08/27 1200) Temp Source: Oral (08/27 1200) BP: 91/65 (08/27 1200) Pulse Rate: 84 (08/27 1200)  Labs: Recent Labs    06/15/22 0209 06/15/22 0413 06/15/22 1135 06/15/22 1135 06/15/22 1522 06/15/22 2338 06/16/22 0330 06/16/22 0916 06/16/22 1231 06/16/22 1715  HGB 17.3*  --   --   --   --   --  14.7  --   --   --   HCT 49.9  --   --   --   --   --  41.7  --   --   --   PLT 245  --   --   --   --   --  158  --   --   --   HEPARINUNFRC  --   --  0.22*   < >  --  0.20*  --  0.36  --  0.25*  CREATININE 1.70*  --   --   --   --   --  1.26*  --  1.32*  --   TROPONINIHS 1,420* 2,596* 6,096*  --  5,251*  --   --   --   --   --    < > = values in this interval not displayed.     Estimated Creatinine Clearance: 68.5 mL/min (A) (by C-G formula based on SCr of 1.32 mg/dL (H)).   Medical History: Past Medical History:  Diagnosis Date   BPH with obstruction/lower urinary tract symptoms 02/02/2018   Controlled type 2 diabetes mellitus with hyperglycemia, with long-term current use of insulin ([place]) 06/28/2021   Diabetic polyneuropathy associated with type 2 diabetes mellitus ([place]) 07/28/2021   Essential hypertension 01/25/2016   Hypertriglyceridemia    Mixed diabetic hyperlipidemia associated with type 2 diabetes mellitus ([place]) 07/28/2021   OSA on CPAP    TIA (transient ischemic attack) 2012    Medications:  Medications Prior to Admission  Medication Sig Dispense Refill Last Dose   acetaminophen (TYLENOL) 325 MG tablet Take 2 tablets (650 mg total) by mouth every 6 (six) hours as needed for mild pain (or Fever >/= 101). 30 tablet 0 unknown   amLODipine (NORVASC) 5 MG tablet Take 5 mg by mouth  daily.   06/14/2022   aspirin EC 81 MG tablet Take 81 mg by mouth daily. Swallow whole.      atorvastatin (LIPITOR) 40 MG tablet Take 40 mg by mouth daily.   06/14/2022   BRILINTA 90 MG TABS tablet Take 90 mg by mouth 2 (two) times daily.   06/14/2022 at 5:00pm   diclofenac Sodium (VOLTAREN) 1 % GEL Apply 2 g topically 2 (two) times daily as needed (pain).   06/14/2022   empagliflozin (JARDIANCE) 25 MG TABS tablet Take 25 mg by mouth daily.   06/14/2022   finasteride (PROSCAR) 5 MG tablet Take 5 mg by mouth daily.   06/14/2022   furosemide (LASIX) 40 MG tablet Take 40 mg by mouth daily.   06/14/2022   gabapentin (NEURONTIN) 300 MG capsule Take 300 mg by mouth.   06/14/2022   lisinopril (ZESTRIL) 2.5 MG tablet Take 2.5 mg by mouth daily.   06/14/2022   metFORMIN (GLUCOPHAGE) 500 MG tablet Take 1,000 mg by mouth 2 (two) times  daily with a meal.   06/14/2022   metoprolol succinate (TOPROL-XL) 25 MG 24 hr tablet Take 12.5 mg by mouth daily.   06/14/2022 at 5:00pm   oxyCODONE (OXY IR/ROXICODONE) 5 MG immediate release tablet Take by mouth.   unknown   potassium chloride SA (KLOR-CON) 20 MEQ tablet Take 20 mEq by mouth daily.   06/14/2022   levETIRAcetam (KEPPRA) 500 MG tablet Take 1 tablet (500 mg total) by mouth 2 (two) times daily for 7 days. (Patient not taking: Reported on 06/15/2022) 14 tablet 0 Completed Course    Scheduled:   aspirin EC  81 mg Oral Daily   atorvastatin  40 mg Oral Daily   docusate sodium  100 mg Oral BID   finasteride  5 mg Oral Daily   gabapentin  300 mg Oral BID   insulin aspart  0-9 Units Subcutaneous TID WC   liver oil-zinc oxide   Topical BID   metoprolol succinate  12.5 mg Oral Daily   sodium chloride flush  3 mL Intravenous Q12H   ticagrelor  90 mg Oral BID   Infusions:   heparin 1,350 Units/hr (06/16/22 1758)   PRN: acetaminophen **OR** acetaminophen, bisacodyl, dextrose, diclofenac Sodium, hydrALAZINE, morphine injection, mouth rinse, oxyCODONE, polyethylene  glycol  Assessment: 73 yom with a history of DES RCA 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 Patient is presenting with hypoglycemia and chest pain. Heparin per pharmacy consult placed for chest pain/ACS.  Heparin level 0.36 on drip rate 1350 units/hr therapeutic. First therapeutic level. Hgb 14.6 and plt 158. There has been questionable hematuria noted. Patient stating that urine color is consistent with urine color prior to arrival and initiation of heparin. Previously discussed with MD Lorin Mercy and decision to continue for now. UA to be obtained. Plan for cath.   Repeat heparin level is subtherapeutic at 0.25.   Goal of Therapy:  Heparin level 0.3-0.7 units/ml Monitor platelets by anticoagulation protocol: Yes   Plan:  Increase heparin to 1500 units/h Daily heparin level and CBC  Arrie Senate, PharmD, [place], [facility] Clinical Pharmacist [phone] Please check AMION for all [facility] Pharmacy numbers 06/16/2022

## 2022-06-16 NOTE — Progress Notes (Signed)
Progress Note   Patient: Peter Yang JSE:[id number] DOB: Sep 01, 1956 DOA: 06/15/2022     1 DOS: the patient was seen and examined on 06/16/2022   Brief hospital course: 66 y.o. male with medical history significant of BPH; DM; HTN; PVD s/p L BKA; CAD s/p stent; and HLD presenting with hypoglycemia - found basically unresponsive during welfare check.  He reports that he was feeling SOB and chest pains.  He called 911 and got an ambulance.  The symptoms this AM lasted maybe an hour.  Symptoms are still present but improved.  He was given NTG in the ambulance. Initial troponin of 1400, with repeat value trending up to 2500.  EKG reportedly notable for new ST depression  Assessment and Plan: NSTEMI -Patient with exertional chest pain/SOB  -Currently without chest pains -Trop peaked to 6096, down to 5252 on most recent check -Cardiology consulted. Recs for heart cath tomorrow -Currently continued on heparin gtt, brilinta   DM with hypoglycemia -Patient reports taking tresiba nightly with resultant glucose 20-30s -He does not have [place] on his med list and this was not filled recently per the pharmacy, but his glucose was 30 with EMS -A1c is 7.5 -For now, will hold medications (including Jardiance, metformin) while in hospital, cont SSI as needed   AKI -Given his known systolic CHF, will be judicious - 50 cc/hr x 10 hours -Hold lisinopril, Jardiance, metformin -Recheck bmet in AM   New onset afib -Rate controlled -Currently on heparin   HTN -Continue amlodipine, Toprol XL -Will also add prn hydralazine   HLD -Continue Lipitor -Check lipids   PVD -s/p L BKA, healing well -His RLE does not have obvious foot wounds currently but he appears to be at high risk given his hygiene -He reports that PT came to do home care and it was going well but she found him profoundly hypoglycemic on one visit and it scared her so much she refused to ever come back; will order PT/OT evaluations    BPH -Continue Proscar   Wounds -Sacral pressure ulcer and RLE ulcer, both in need of attention -Wound care consulted   Chronic systolic CHF -EF 07-37% on last echo on 02/11/22 -Appears volume depleted currently -Needs close monitoring   DNR -Dr. Lorin Mercy discussed code status with the patient and he would prefer to die a natural death should that situation arise. -He will need a gold out of facility DNR form at the time of discharge        Subjective: Without chest pains this AM  Physical Exam: Vitals:   06/16/22 1006 06/16/22 1012 06/16/22 1100 06/16/22 1200  BP:  90/62  91/65  Pulse:    84  Resp:  '13 14 14  '$ Temp: 98.7 F (37.1 C)   98.5 F (36.9 C)  TempSrc: Oral   Oral  SpO2:    98%  Weight:      Height:       General exam: Awake, laying in bed, in nad Respiratory system: Normal respiratory effort, no wheezing Cardiovascular system: regular rate, s1, s2 Gastrointestinal system: Soft, nondistended, positive BS Central nervous system: CN2-12 grossly intact, Yang intact Extremities: Perfused, no clubbing Skin: Normal skin turgor, no notable skin lesions seen Psychiatry: Mood normal // no visual hallucinations   Data Reviewed:  Labs reviewed: Na 134, K 4.7, Cr 1.32   Family Communication:  Pt in room, family not at bedside  Disposition: Status is: Inpatient Remains inpatient appropriate because: Severity of illness  Planned Discharge Destination:  Home    Author: Marylu Lund, MD 06/16/2022 3:26 PM  For on call review [email or web address].

## 2022-06-16 NOTE — Hospital Course (Signed)
66 y.o. male with medical history significant of BPH; DM; HTN; PVD s/p L BKA; CAD s/p stent; and HLD presenting with hypoglycemia - found basically unresponsive during welfare check.  He reports that he was feeling SOB and chest pains.  He called 911 and got an ambulance.  The symptoms this AM lasted maybe an hour.  Symptoms are still present but improved.  He was given NTG in the ambulance. Initial troponin of 1400, with repeat value trending up to 2500.  EKG reportedly notable for new ST depression

## 2022-06-17 ENCOUNTER — Encounter (HOSPITAL_COMMUNITY)
Admission: EM | Disposition: A | Discharge status: Home Health | Payer: Self-pay | Source: Home / Self Care | Attending: Internal Medicine

## 2022-06-17 DIAGNOSIS — N179 Acute kidney failure, unspecified: Secondary | ICD-10-CM

## 2022-06-17 DIAGNOSIS — I2510 Atherosclerotic heart disease of native coronary artery without angina pectoris: Secondary | ICD-10-CM

## 2022-06-17 DIAGNOSIS — I5022 Chronic systolic (congestive) heart failure: Secondary | ICD-10-CM

## 2022-06-17 DIAGNOSIS — I739 Peripheral vascular disease, unspecified: Secondary | ICD-10-CM

## 2022-06-17 HISTORY — PX: LEFT HEART CATH AND CORONARY ANGIOGRAPHY: CATH118249

## 2022-06-17 LAB — BASIC METABOLIC PANEL
Anion gap: 6 (ref 5–15)
BUN: 48 mg/dL — ABNORMAL HIGH (ref 8–23)
CO2: 20 mmol/L — ABNORMAL LOW (ref 22–32)
Calcium: 8.6 mg/dL — ABNORMAL LOW (ref 8.9–10.3)
Chloride: 111 mmol/L (ref 98–111)
Creatinine, Ser: 1.29 mg/dL — ABNORMAL HIGH (ref 0.61–1.24)
GFR, Estimated: >60 mL/min (ref >60)
Glucose, Bld: 146 mg/dL — ABNORMAL HIGH (ref 70–99)
Potassium: 4.2 mmol/L (ref 3.5–5.1)
Sodium: 137 mmol/L (ref 135–145)

## 2022-06-17 LAB — CBC
HCT: 38.5 % — ABNORMAL LOW (ref 39.0–52.0)
Hemoglobin: 12.9 g/dL — ABNORMAL LOW (ref 13.0–17.0)
MCH: 32.7 pg (ref 26.0–34.0)
MCHC: 33.5 g/dL (ref 30.0–36.0)
MCV: 97.7 fL (ref 80.0–100.0)
Platelets: 132 10*3/uL — ABNORMAL LOW (ref 150–400)
RBC: 3.94 MIL/uL — ABNORMAL LOW (ref 4.22–5.81)
RDW: 13.7 % (ref 11.5–15.5)
WBC: 8.7 10*3/uL (ref 4.0–10.5)
nRBC: 0 % (ref 0.0–0.2)

## 2022-06-17 LAB — GLUCOSE, CAPILLARY
Glucose-Capillary: 154 mg/dL — ABNORMAL HIGH (ref 70–99)
Glucose-Capillary: 146 mg/dL — ABNORMAL HIGH (ref 70–99)
Glucose-Capillary: 212 mg/dL — ABNORMAL HIGH (ref 70–99)
Glucose-Capillary: 216 mg/dL — ABNORMAL HIGH (ref 70–99)

## 2022-06-17 LAB — HEPARIN LEVEL (UNFRACTIONATED): Heparin Unfractionated: 0.37 IU/mL (ref 0.30–0.70)

## 2022-06-17 SURGERY — LEFT HEART CATH AND CORONARY ANGIOGRAPHY
Anesthesia: LOCAL

## 2022-06-17 MED ORDER — SODIUM CHLORIDE 0.9 % IV SOLN: INTRAVENOUS | Status: AC

## 2022-06-17 MED ORDER — HYDRALAZINE HCL 20 MG/ML IJ SOLN: 10.0000 mg | INTRAMUSCULAR | Status: AC | PRN

## 2022-06-17 MED ORDER — SODIUM CHLORIDE 0.9 % IV SOLN
250.0000 mL | INTRAVENOUS | Status: DC | PRN
Start: 2022-06-17 — End: 2022-06-19

## 2022-06-17 MED ORDER — MIDAZOLAM HCL 2 MG/2ML IJ SOLN
INTRAMUSCULAR | Status: AC
  Filled 2022-06-17: qty 2

## 2022-06-17 MED ORDER — SODIUM CHLORIDE 0.9 % IV SOLN: 250.0000 mL | INTRAVENOUS | Status: DC | PRN

## 2022-06-17 MED ORDER — SODIUM CHLORIDE 0.9% FLUSH: 3.0000 mL | Freq: Two times a day (BID) | INTRAVENOUS | Status: DC

## 2022-06-17 MED ORDER — FENTANYL CITRATE (PF) 100 MCG/2ML IJ SOLN
INTRAMUSCULAR | Status: DC | PRN
  Administered 2022-06-17: 25 ug via INTRAVENOUS

## 2022-06-17 MED ORDER — HEPARIN SODIUM (PORCINE) 1000 UNIT/ML IJ SOLN
INTRAMUSCULAR | Status: AC
  Filled 2022-06-17: qty 10

## 2022-06-17 MED ORDER — ADULT MULTIVITAMIN W/MINERALS CH
1.0000 | ORAL_TABLET | Freq: Every day | ORAL | Status: DC
  Administered 2022-06-17 – 2022-06-18 (×2): 1 via ORAL
  Filled 2022-06-17 (×2): qty 1

## 2022-06-17 MED ORDER — FENTANYL CITRATE (PF) 100 MCG/2ML IJ SOLN
INTRAMUSCULAR | Status: AC
  Filled 2022-06-17: qty 2

## 2022-06-17 MED ORDER — HEPARIN SODIUM (PORCINE) 1000 UNIT/ML IJ SOLN
INTRAMUSCULAR | Status: DC | PRN
  Administered 2022-06-17: 5000 [IU] via INTRAVENOUS

## 2022-06-17 MED ORDER — HEPARIN (PORCINE) IN NACL 1000-0.9 UT/500ML-% IV SOLN
INTRAVENOUS | Status: DC | PRN
  Administered 2022-06-17 (×2): 500 mL

## 2022-06-17 MED ORDER — OXYMETAZOLINE HCL 0.05 % NA SOLN
1.0000 | Freq: Two times a day (BID) | NASAL | Status: DC
Start: 2022-06-17 — End: 2022-06-19
  Administered 2022-06-17: 1 via NASAL
  Filled 2022-06-17: qty 30

## 2022-06-17 MED ORDER — SODIUM CHLORIDE 0.9% FLUSH: 3.0000 mL | INTRAVENOUS | Status: DC | PRN

## 2022-06-17 MED ORDER — SODIUM CHLORIDE 0.9% FLUSH
3.0000 mL | Freq: Two times a day (BID) | INTRAVENOUS | Status: DC
  Administered 2022-06-17: 3 mL via INTRAVENOUS

## 2022-06-17 MED ORDER — HEPARIN (PORCINE) IN NACL 1000-0.9 UT/500ML-% IV SOLN
INTRAVENOUS | Status: AC
  Filled 2022-06-17: qty 1000

## 2022-06-17 MED ORDER — LABETALOL HCL 5 MG/ML IV SOLN: 10.0000 mg | INTRAVENOUS | Status: AC | PRN

## 2022-06-17 MED ORDER — LIDOCAINE HCL (PF) 1 % IJ SOLN
INTRAMUSCULAR | Status: DC | PRN
  Administered 2022-06-17: 2 mL

## 2022-06-17 MED ORDER — MIDAZOLAM HCL 2 MG/2ML IJ SOLN
INTRAMUSCULAR | Status: DC | PRN
  Administered 2022-06-17: 1 mg via INTRAVENOUS

## 2022-06-17 MED ORDER — IOHEXOL 350 MG/ML SOLN
INTRAVENOUS | Status: DC | PRN
  Administered 2022-06-17: 40 mL

## 2022-06-17 MED ORDER — ENSURE ENLIVE PO LIQD
237.0000 mL | Freq: Two times a day (BID) | ORAL | Status: DC
  Administered 2022-06-17 – 2022-06-18 (×3): 237 mL via ORAL

## 2022-06-17 MED ORDER — JUVEN PO PACK
1.0000 | PACK | Freq: Two times a day (BID) | ORAL | Status: DC
  Administered 2022-06-17 – 2022-06-18 (×2): 1 via ORAL
  Filled 2022-06-17 (×3): qty 1

## 2022-06-17 MED ORDER — HEPARIN (PORCINE) 25000 UT/250ML-% IV SOLN
1650.0000 [IU]/h | INTRAVENOUS | Status: DC
Start: 2022-06-17 — End: 2022-06-18
  Administered 2022-06-17: 1500 [IU]/h via INTRAVENOUS
  Administered 2022-06-18: 1650 [IU]/h via INTRAVENOUS
  Filled 2022-06-17 (×2): qty 250

## 2022-06-17 MED ORDER — VERAPAMIL HCL 2.5 MG/ML IV SOLN
INTRAVENOUS | Status: AC
  Filled 2022-06-17: qty 2

## 2022-06-17 MED ORDER — VERAPAMIL HCL 2.5 MG/ML IV SOLN
INTRAVENOUS | Status: DC | PRN
  Administered 2022-06-17: 10 mL via INTRA_ARTERIAL

## 2022-06-17 MED ORDER — LIDOCAINE HCL (PF) 1 % IJ SOLN
INTRAMUSCULAR | Status: AC
  Filled 2022-06-17: qty 30

## 2022-06-17 SURGICAL SUPPLY — 13 items
BAND CMPR LRG ZPHR (HEMOSTASIS) ×1
BAND ZEPHYR COMPRESS 30 LONG (HEMOSTASIS) IMPLANT
CATH 5FR JL3.5 JR4 ANG PIG MP (CATHETERS) IMPLANT
CATH INFINITI 5 FR AR1 MOD (CATHETERS) IMPLANT
CATH INFINITI 5FR AL1 (CATHETERS) IMPLANT
ELECT DEFIB PAD ADLT CADENCE (PAD) IMPLANT
GLIDESHEATH SLEND SS 6F .021 (SHEATH) IMPLANT
GUIDEWIRE INQWIRE 1.5J.035X260 (WIRE) IMPLANT
INQWIRE 1.5J .035X260CM (WIRE) ×1
KIT HEART LEFT (KITS) ×1 IMPLANT
PACK CARDIAC CATHETERIZATION (CUSTOM PROCEDURE TRAY) ×1 IMPLANT
TRANSDUCER W/STOPCOCK (MISCELLANEOUS) ×1 IMPLANT
TUBING CIL FLEX 10 FLL-RA (TUBING) ×1 IMPLANT

## 2022-06-17 NOTE — TOC Progression Note (Signed)
Transition of Care [facility]) - Progression Note    Patient Details  Name: Peter Yang MRN: [id number] Date of Birth: [birth year]  Transition of [facility]) CM/SW Contact  Zenon Mayo, RN Phone Number: 06/17/2022, 5:26 PM  Clinical Narrative:    Patient is from home alone, presents with NSTEMI, afib , has chronic left BKA, s/p cardiac cath today, conts on hep drip, he has prosthesis at home per patient.  NCM offered choice, patient states he is active with [facility] for [facility] [place] and would like to continue with them.  Will need to add HHOT to serviices.  NCM confirmed with Anderson Malta with [facility].  TOC following.         Expected Discharge Plan and Services                                                 Social Determinants of Health (SDOH) Interventions    Readmission Risk Interventions     No data to display

## 2022-06-17 NOTE — Progress Notes (Signed)
Initial Nutrition Assessment  DOCUMENTATION CODES:   Not applicable  INTERVENTION:  Continue carb modified diet 1 packet Juven BID, each packet provides 95 calories, 2.5 grams of protein (collagen), and 9.8 grams of carbohydrate (3 grams sugar); + vitamins and minerals to support wound healing Ensure Enlive po BID, each supplement provides 350 kcal and 20 grams of protein. MVI with minerals daily  NUTRITION DIAGNOSIS:   Increased nutrient needs related to wound healing as evidenced by estimated needs.  GOAL:   Patient will meet greater than or equal to 90% of their needs  MONITOR:   PO intake, Supplement acceptance, Labs, Weight trends, Skin  REASON FOR ASSESSMENT:   Consult Other (Comment) (nutrition goals)  ASSESSMENT:   Pt admitted with hypoglycemia, found to have NSTEMI. PMH significant for BPH, DM, PVD s/p L BKA, CAD s/p stent and HLD.   Patient off unit time of visit for cardiac cath. Unable to obtain nutrition-related history at this time.   Meal completions: 8/27: 100% x2 recorded meals  Patient would benefit from addition of nutrition supplements to support wound healing.   Unfortunately, there is limited documentation of weight history on file to review. Current weight appears to be stable from documented weight 07/28/21. Will continue to monitor throughout admission.   Medications: colace, SSI 0-9 units TID IV drips: NaCl @ 11m/hr  Labs: BUN 48, Cr 1.29, AST 44, HgbA1c 7.5%, CBG's 128-195 x24 hours  NUTRITION - FOCUSED PHYSICAL EXAM: Pt off unit. Deferred to follow up.   Diet Order:   Diet Order             Diet Carb Modified Fluid consistency: Thin; Room service appropriate? Yes; Fluid restriction: 2000 mL Fluid  Diet effective now                   EDUCATION NEEDS:   No education needs have been identified at this time  Skin:  Skin Assessment: Skin Integrity Issues: Skin Integrity Issues:: DTI DTI: per WOC: Right LE full thickness  wound and sacral DTPI with surrounding irritant contact dermatitis  Last BM:  8/27  Height:   Ht Readings from Last 1 Encounters:  06/15/22 6' 3.98" (1.93 m)    Weight:   Wt Readings from Last 1 Encounters:  06/17/22 94.7 kg   BMI:  Body mass index is 25.41 kg/m.  Estimated Nutritional Needs:   Kcal:  2400-2600  Protein:  125-140g  Fluid:  >/=2L  AClayborne Dana RDN, LDN Clinical Nutrition

## 2022-06-17 NOTE — Progress Notes (Signed)
Progress Note   Patient: Peter Yang ZOX:[id number] DOB: Aug 12, 1956 DOA: 06/15/2022     2 DOS: the patient was seen and examined on 06/17/2022   Brief hospital course: 66 y.o. male with medical history significant of BPH; DM; HTN; PVD s/p L BKA; CAD s/p stent; and HLD presenting with hypoglycemia - found basically unresponsive during welfare check.  He reports that he was feeling SOB and chest pains.  He called 911 and got an ambulance.  The symptoms this AM lasted maybe an hour.  Symptoms are still present but improved.  He was given NTG in the ambulance. Initial troponin of 1400, with repeat value trending up to 2500.  EKG reportedly notable for new ST depression  Assessment and Plan: NSTEMI -Patient with exertional chest pain/SOB  -Currently without chest pains -Trop peaked to 6096, down to 5252 on most recent check -Cardiology consulted, now s/p heart cath 8/28. Findings reviewed, notable for severe multivessel CAD with sereve calcification. Cardiology recs for viability study and if RCA territory is viable, then CTS consultation for CABG would be considered. If RCA territory is not viable, then medical therapy vs PCI to dominant branch D1 would be considered -Will f/u per Cardiology   DM with hypoglycemia -Patient reports taking tresiba nightly with resultant glucose 20-30s -He does not have [place] on his med list and this was not filled recently per the pharmacy, but his glucose was 30 with EMS -A1c is 7.5 -For now, will hold medications (including Jardiance, metformin) while in hospital, cont SSI as needed -Glycemic trends stable   AKI -Given his known systolic CHF, will be judicious - 50 cc/hr x 10 hours -Hold lisinopril, Jardiance, metformin -Cr down this AM -Recheck BMET in AM   New onset afib -Rate controlled -Currently on heparin   HTN -Continue amlodipine, Toprol XL -Will also add prn hydralazine   HLD -Continue Lipitor -Check lipids   PVD -s/p L BKA, healing  well -His RLE does not have obvious foot wounds currently but he appears to be at high risk given his hygiene -He reports that PT came to do home care and it was going well but she found him profoundly hypoglycemic on one visit and it scared her so much she refused to ever come back; will order PT/OT evaluations   BPH -Continue Proscar   Wounds -Sacral pressure ulcer and RLE ulcer, both in need of attention -Wound care consulted   Chronic systolic CHF -EF 81-19% on last echo on 02/11/22 -seems stable   DNR -Dr. Lorin Mercy discussed code status with the patient and he would prefer to die a natural death should that situation arise. -He will need a gold out of facility DNR form at the time of discharge        Subjective: Seen after CABG. Denies chest pain currently or sob  Physical Exam: Vitals:   06/16/22 2128 06/17/22 0528 06/17/22 1014 06/17/22 1101  BP: 104/64 91/60 101/61   Pulse: 92     Resp: '15 15 14   '$ Temp: 99.1 F (37.3 C) 97.9 F (36.6 C)    TempSrc: Oral Oral    SpO2: 98% 97%  99%  Weight:  94.7 kg    Height:       General exam: Conversant, in no acute distress Respiratory system: normal chest rise, clear, no audible wheezing Cardiovascular system: regular rhythm, s1-s2 Gastrointestinal system: Nondistended, nontender, pos BS Central nervous system: No seizures, no tremors Extremities: No cyanosis, no joint deformities Skin: No rashes, no  pallor Psychiatry: Affect normal // no auditory hallucinations   Data Reviewed:  Labs reviewed: Na 137, K 4.2, Cr 1.29   Family Communication:  Pt in room, family not at bedside  Disposition: Status is: Inpatient Remains inpatient appropriate because: Severity of illness  Planned Discharge Destination: Home    Author: Marylu Lund, MD 06/17/2022 3:23 PM  For on call review [email or web address].

## 2022-06-17 NOTE — Progress Notes (Signed)
   06/17/22 0900  Mobility  Activity Refused mobility   Pt refused mobility d/t stating " Yesterday was enough for him". Will f/u as able.

## 2022-06-17 NOTE — Progress Notes (Signed)
ANTICOAGULATION CONSULT NOTE  Pharmacy Consult for Heparin Indication: chest pain/ACS    (note: also has new onset Afib v)   No Known Allergies  Patient Measurements: Height: 6' 3.98" (193 cm) Weight: 94.7 kg (208 lb 11.2 oz) IBW/kg (Calculated) : 86.76 Heparin Dosing Weight: 88.9 kg  Vital Signs: Temp: 97.9 F (36.6 C) (08/28 0528) Temp Source: Oral (08/28 0528) BP: 91/60 (08/28 0528) Pulse Rate: 92 (08/27 2128)  Labs: Recent Labs    06/15/22 0209 06/15/22 0413 06/15/22 1135 06/15/22 1522 06/15/22 2338 06/16/22 0330 06/16/22 0916 06/16/22 1231 06/16/22 1715 06/17/22 0726  HGB 17.3*  --   --   --   --  14.7  --   --   --  12.9*  HCT 49.9  --   --   --   --  41.7  --   --   --  38.5*  PLT 245  --   --   --   --  158  --   --   --  132*  HEPARINUNFRC  --   --  0.22*  --    < >  --  0.36  --  0.25* 0.37  CREATININE 1.70*  --   --   --   --  1.26*  --  1.32*  --  1.29*  TROPONINIHS 1,420* 2,596* 6,096* 5,251*  --   --   --   --   --   --    < > = values in this interval not displayed.     Estimated Creatinine Clearance: 70.1 mL/min (A) (by C-G formula based on SCr of 1.29 mg/dL (H)).   Medical History: Past Medical History:  Diagnosis Date   BPH with obstruction/lower urinary tract symptoms 02/02/2018   Controlled type 2 diabetes mellitus with hyperglycemia, with long-term current use of insulin ([place]) 06/28/2021   Diabetic polyneuropathy associated with type 2 diabetes mellitus ([place]) 07/28/2021   Essential hypertension 01/25/2016   Hypertriglyceridemia    Mixed diabetic hyperlipidemia associated with type 2 diabetes mellitus ([place]) 07/28/2021   OSA on CPAP    TIA (transient ischemic attack) 2012    Medications:  Medications Prior to Admission  Medication Sig Dispense Refill Last Dose   acetaminophen (TYLENOL) 325 MG tablet Take 2 tablets (650 mg total) by mouth every 6 (six) hours as needed for mild pain (or Fever >/= 101). 30 tablet 0 unknown   amLODipine  (NORVASC) 5 MG tablet Take 5 mg by mouth daily.   06/14/2022   aspirin EC 81 MG tablet Take 81 mg by mouth daily. Swallow whole.      atorvastatin (LIPITOR) 40 MG tablet Take 40 mg by mouth daily.   06/14/2022   BRILINTA 90 MG TABS tablet Take 90 mg by mouth 2 (two) times daily.   06/14/2022 at 5:00pm   diclofenac Sodium (VOLTAREN) 1 % GEL Apply 2 g topically 2 (two) times daily as needed (pain).   06/14/2022   empagliflozin (JARDIANCE) 25 MG TABS tablet Take 25 mg by mouth daily.   06/14/2022   finasteride (PROSCAR) 5 MG tablet Take 5 mg by mouth daily.   06/14/2022   furosemide (LASIX) 40 MG tablet Take 40 mg by mouth daily.   06/14/2022   gabapentin (NEURONTIN) 300 MG capsule Take 300 mg by mouth.   06/14/2022   lisinopril (ZESTRIL) 2.5 MG tablet Take 2.5 mg by mouth daily.   06/14/2022   metFORMIN (GLUCOPHAGE) 500 MG tablet Take 1,000 mg by mouth  2 (two) times daily with a meal.   06/14/2022   metoprolol succinate (TOPROL-XL) 25 MG 24 hr tablet Take 12.5 mg by mouth daily.   06/14/2022 at 5:00pm   oxyCODONE (OXY IR/ROXICODONE) 5 MG immediate release tablet Take by mouth.   unknown   potassium chloride SA (KLOR-CON) 20 MEQ tablet Take 20 mEq by mouth daily.   06/14/2022   levETIRAcetam (KEPPRA) 500 MG tablet Take 1 tablet (500 mg total) by mouth 2 (two) times daily for 7 days. (Patient not taking: Reported on 06/15/2022) 14 tablet 0 Completed Course    Scheduled:   aspirin EC  81 mg Oral Daily   atorvastatin  40 mg Oral Daily   docusate sodium  100 mg Oral BID   finasteride  5 mg Oral Daily   gabapentin  300 mg Oral BID   insulin aspart  0-9 Units Subcutaneous TID WC   liver oil-zinc oxide   Topical BID   metoprolol succinate  12.5 mg Oral Daily   sodium chloride flush  3 mL Intravenous Q12H   sodium chloride flush  3 mL Intravenous Q12H   ticagrelor  90 mg Oral BID   Infusions:   sodium chloride     sodium chloride 75 mL/hr at 06/17/22 0540   heparin 1,500 Units/hr (06/16/22 2146)   PRN:  sodium chloride, acetaminophen **OR** acetaminophen, bisacodyl, dextrose, diclofenac Sodium, hydrALAZINE, morphine injection, mouth rinse, oxyCODONE, polyethylene glycol, sodium chloride flush  Assessment: Peter Yang with a history of DES RCA 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 Patient  presented on 06/15/22 with hypoglycemia and chest pain. Heparin per pharmacy consult placed for chest pain/ACS. Not on anticoagulation prior to admission.  Prior to today 06/17/22, there has been questionable hematuria noted. Patient stated that urine color is consistent with urine color prior to arrival and initiation of heparin. Previously discussed with MD Lorin Mercy and decision to continue  heparin for now. UA to be obtained.  Urine tea colored and clear noted 8/27 PM.  No blood noted.  06/17/26:  Heparin level is therapeutic at 0.37 on 1500 units/hr   Hgb 17.3>14.7>>12.9, pltc [phone] Not on anticoagulation prior to admission.  AKI: SCr has trended down.  No active bleeding reported.    Goal of Therapy:  Heparin level 0.3-0.7 units/ml Monitor platelets by anticoagulation protocol: Yes   Plan:  Continue IV heparin 1500 units/h Daily heparin level and CBC Follow up plans for cardiac cath.   Nicole Cella, RPh Clinical Pharmacist [phone] Please check AMION for all [facility] Pharmacy numbers 06/17/2022

## 2022-06-17 NOTE — Progress Notes (Signed)
OT Cancellation Note  Patient Details Name: Peter Yang MRN: [id number] DOB: 1956-10-21   Cancelled Treatment:    Reason Eval/Treat Not Completed: Patient at procedure or test/ unavailable (Cath Lab. Will return as schedule allows.)  [place], OTR/L Acute Rehab Office: [phone] 06/17/2022, 10:47 AM

## 2022-06-17 NOTE — Progress Notes (Signed)
ANTICOAGULATION CONSULT NOTE  Pharmacy Consult for Heparin Indication: chest pain/ACS    (note: also has new onset Afib)   No Known Allergies  Patient Measurements: Height: 6' 3.98" (193 cm) Weight: 94.7 kg (208 lb 11.2 oz) IBW/kg (Calculated) : 86.76 Heparin Dosing Weight: 88.9 kg  Vital Signs: Temp: 97.9 F (36.6 C) (08/28 0528) Temp Source: Oral (08/28 0528) BP: 101/61 (08/28 1014)  Labs: Recent Labs    06/15/22 0209 06/15/22 0413 06/15/22 1135 06/15/22 1522 06/15/22 2338 06/16/22 0330 06/16/22 0916 06/16/22 1231 06/16/22 1715 06/17/22 0726  HGB 17.3*  --   --   --   --  14.7  --   --   --  12.9*  HCT 49.9  --   --   --   --  41.7  --   --   --  38.5*  PLT 245  --   --   --   --  158  --   --   --  132*  HEPARINUNFRC  --   --  0.22*  --    < >  --  0.36  --  0.25* 0.37  CREATININE 1.70*  --   --   --   --  1.26*  --  1.32*  --  1.29*  TROPONINIHS 1,420* 2,596* 6,096* 5,251*  --   --   --   --   --   --    < > = values in this interval not displayed.     Estimated Creatinine Clearance: 70.1 mL/min (A) (by C-G formula based on SCr of 1.29 mg/dL (H)).  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 Patient  presented on 06/15/22 with hypoglycemia and chest pain. Heparin per pharmacy consult placed for chest pain/ACS.  Patient is s/p cath and being evaluated for CABG vs medical therapy vs PCI.  Pharmacy initially consulted to resume heparin 2 hours post radial band removal; however, RN reports nosebleed when patient sits upright.  Reached out to Cardiologist and Pharmacy to resume IV heparin 4 hours post radial band removal.  Afrin x 3 days per TRH.  TR band removed at 1630 per RN.  Goal of Therapy:  Heparin level 0.3-0.5 units/ml given nosebleed Monitor platelets by anticoagulation protocol: Yes   Plan:  At 2030, resume IV heparin at 1500 units/hr Check 6 hr heparin  level Monitor for resolution of nosebleed  Lya Holben D. Mina Marble, PharmD, BCPS, [place] 06/17/2022, 5:01 PM

## 2022-06-17 NOTE — Progress Notes (Addendum)
Rounding Note    Patient Name: Peter Yang Date of Encounter: 06/17/2022  [place] Cardiologist: Dr Maylene Roes, [place]   Patient states he is feeling well, no chest pain or SOB, waiting for cardiac catheterization today. He denied any heart palpitation, dizziness.   Inpatient Medications    Scheduled Meds:  aspirin EC  81 mg Oral Daily   atorvastatin  40 mg Oral Daily   docusate sodium  100 mg Oral BID   finasteride  5 mg Oral Daily   gabapentin  300 mg Oral BID   insulin aspart  0-9 Units Subcutaneous TID WC   liver oil-zinc oxide   Topical BID   metoprolol succinate  12.5 mg Oral Daily   sodium chloride flush  3 mL Intravenous Q12H   sodium chloride flush  3 mL Intravenous Q12H   ticagrelor  90 mg Oral BID   Continuous Infusions:  sodium chloride     sodium chloride 75 mL/hr at 06/17/22 0540   heparin 1,500 Units/hr (06/16/22 2146)   PRN Meds: sodium chloride, acetaminophen **OR** acetaminophen, bisacodyl, dextrose, diclofenac Sodium, hydrALAZINE, morphine injection, mouth rinse, oxyCODONE, polyethylene glycol, sodium chloride flush   Vital Signs    Vitals:   06/16/22 1100 06/16/22 1200 06/16/22 2128 06/17/22 0528  BP:  91/65 104/64 91/60  Pulse:  84 92   Resp: '14 14 15 15  '$ Temp:  98.5 F (36.9 C) 99.1 F (37.3 C) 97.9 F (36.6 C)  TempSrc:  Oral Oral Oral  SpO2:  98% 98% 97%  Weight:    94.7 kg  Height:        Intake/Output Summary (Last 24 hours) at 06/17/2022 0958 Last data filed at 06/17/2022 0900 Gross per 24 hour  Intake 1420.98 ml  Output 2300 ml  Net -879.02 ml      06/17/2022    5:28 AM 06/16/2022    5:32 AM 06/15/2022   11:10 AM  Last 3 Weights  Weight (lbs) 208 lb 11.2 oz 209 lb 14.1 oz 195 lb 15.8 oz  Weight (kg) 94.666 kg 95.2 kg 88.9 kg      Telemetry    A fib with ventricular rate of 80s - Personally Reviewed  ECG    No new tracing today - Personally Reviewed  Physical Exam   GEN: No acute  distress.   Neck: No JVD Cardiac: Irregularly irregular, no murmurs, rubs, or gallops.  Respiratory: Clear to auscultation bilaterally. On room air. Speaks full sentence.  GI: Soft, nontender MS: No leg edema; Left BKA ; RLE wound noted  Neuro:  Nonfocal  Psych: Normal affect   Labs    High Sensitivity Troponin:   Recent Labs  Lab 06/15/22 0209 06/15/22 0413 06/15/22 1135 06/15/22 1522  TROPONINIHS 1,420* 2,596* 6,096* 5,251*     Chemistry Recent Labs  Lab 06/15/22 0209 06/16/22 0330 06/16/22 1231 06/17/22 0726  NA 136 135 134* 137  K 4.7 4.7 4.7 4.2  CL 108 107 106 111  CO2 16* 16* 19* 20*  GLUCOSE 94 180* 184* 146*  BUN 74* 57* 55* 48*  CREATININE 1.70* 1.26* 1.32* 1.29*  CALCIUM 10.3 9.2 9.2 8.6*  PROT 7.4  --   --   --   ALBUMIN 4.0  --   --   --   AST 44*  --   --   --   ALT 23  --   --   --   ALKPHOS 69  --   --   --  BILITOT 0.7  --   --   --   GFRNONAA 44* >60 60* >60  ANIONGAP '12 12 9 6    '$ Lipids  Recent Labs  Lab 06/16/22 0330  CHOL 79  TRIG 92  HDL 36*  LDLCALC 25  CHOLHDL 2.2    Hematology Recent Labs  Lab 06/15/22 0209 06/16/22 0330 06/17/22 0726  WBC 19.2* 11.5* 8.7  RBC 5.26 4.45 3.94*  HGB 17.3* 14.7 12.9*  HCT 49.9 41.7 38.5*  MCV 94.9 93.7 97.7  MCH 32.9 33.0 32.7  MCHC 34.7 35.3 33.5  RDW 13.2 13.4 13.7  PLT 245 158 132*   Thyroid No results for input(s): "TSH", "FREET4" in the last 168 hours.  BNPNo results for input(s): "BNP", "PROBNP" in the last 168 hours.  DDimer No results for input(s): "DDIMER" in the last 168 hours.   Radiology    ECHOCARDIOGRAM COMPLETE  Result Date: 06/15/2022    ECHOCARDIOGRAM REPORT   Patient Name:   Herbie Lehrmann Date of Exam: 06/15/2022 Medical Rec #:  [id number]      Height:       76.0 in Accession #:    [id number]     Weight:       196.0 lb Date of Birth:  1956-09-04       BSA:          2.196 m Patient Age:    49 years       BP:           89/58 mmHg Patient Gender: M              HR:            89 bpm. Exam Location:  Inpatient Procedure: 2D Echo, Cardiac Doppler, Color Doppler and Intracardiac            Opacification Agent Indications:    NSTEMI  History:        Patient has no prior history of Echocardiogram examinations.                 CAD; Signs/Symptoms:Chest Pain. Based on information from the                 patient, a heart cath was done at [facility] a few months ago                 with unsuccessful PCI. BKA.  Sonographer:    Merrie Roof RDCS Referring Phys: [place]  1. Left ventricular ejection fraction, by estimation, is 40 to 45%. The left ventricle has mildly decreased function. The left ventricle demonstrates regional wall motion abnormalities (see scoring diagram/findings for description). There is mild left ventricular hypertrophy. Left ventricular diastolic function could not be evaluated. There is moderate hypokinesis of the left ventricular, basal-mid inferior wall and inferolateral wall.  2. Right ventricular systolic function is mildly reduced. The right ventricular size is normal.  3. Left atrial size was severely dilated.  4. The mitral valve is abnormal. Moderate mitral valve regurgitation.  5. The aortic valve is tricuspid. Aortic valve regurgitation is not visualized.  6. Aortic dilatation noted. There is moderate dilatation of the ascending aorta, measuring 47 mm. There is mild dilatation of the aortic root, measuring 42 mm.  7. The inferior vena cava is normal in size with greater than 50% respiratory variability, suggesting right atrial pressure of 3 mmHg. Comparison(s): Changes from prior study are noted. 02/11/2022 [facility]): LVEF 35-40%, global hypokinesis worse laterally. FINDINGS  Left Ventricle: Left ventricular ejection fraction, by estimation, is 40 to 45%. The left ventricle has mildly decreased function. The left ventricle demonstrates regional wall motion abnormalities. Moderate hypokinesis of the left ventricular, basal-mid inferior wall  and inferolateral wall. Definity contrast agent was given IV to delineate the left ventricular endocardial borders. The left ventricular internal cavity size was normal in size. There is mild left ventricular hypertrophy. Left ventricular diastolic function could not be evaluated due to atrial fibrillation. Left ventricular diastolic function could not be evaluated. Right Ventricle: The right ventricular size is normal. No increase in right ventricular wall thickness. Right ventricular systolic function is mildly reduced. Left Atrium: Left atrial size was severely dilated. Right Atrium: Right atrial size was normal in size. Pericardium: There is no evidence of pericardial effusion. Mitral Valve: The mitral valve is abnormal. Moderate mitral valve regurgitation, with centrally-directed jet. Tricuspid Valve: The tricuspid valve is grossly normal. Tricuspid valve regurgitation is not demonstrated. Aortic Valve: The aortic valve is tricuspid. Aortic valve regurgitation is not visualized. Pulmonic Valve: The pulmonic valve was grossly normal. Pulmonic valve regurgitation is trivial. Aorta: Aortic dilatation noted. There is moderate dilatation of the ascending aorta, measuring 47 mm. There is mild dilatation of the aortic root, measuring 42 mm. Venous: The inferior vena cava is normal in size with greater than 50% respiratory variability, suggesting right atrial pressure of 3 mmHg. IAS/Shunts: The interatrial septum was not well visualized.  LEFT VENTRICLE PLAX 2D LVIDd:         6.20 cm LVIDs:         4.60 cm LV PW:         0.60 cm LV IVS:        1.10 cm LVOT diam:     2.50 cm LV SV:         40 LV SV Index:   18 LVOT Area:     4.91 cm  RIGHT VENTRICLE RV Basal diam:  3.60 cm RV S prime:     9.90 cm/s TAPSE (M-mode): 1.6 cm LEFT ATRIUM            Index        RIGHT ATRIUM           Index LA diam:      4.60 cm  2.09 cm/m   RA Area:     16.70 cm LA Vol (A2C): 48.5 ml  22.08 ml/m  RA Volume:   43.40 ml  19.76 ml/m LA Vol  (A4C): 130.0 ml 59.19 ml/m  AORTIC VALVE LVOT Vmax:   55.90 cm/s LVOT Vmean:  35.700 cm/s LVOT VTI:    0.082 m  AORTA Ao Root diam: 4.20 cm Ao Asc diam:  4.70 cm  SHUNTS Systemic VTI:  0.08 m Systemic Diam: 2.50 cm Lyman Bishop MD Electronically signed by Lyman Bishop MD Signature Date/Time: 06/15/2022/5:25:47 PM    Final     Cardiac Studies   ECHO: 06/14/2022  1. Left ventricular ejection fraction, by estimation, is 40 to 45%. The  left ventricle has mildly decreased function. The left ventricle  demonstrates regional wall motion abnormalities (see scoring  diagram/findings for description). There is mild left ventricular hypertrophy.  Left ventricular diastolic function could not be evaluated. There is moderate hypokinesis of the left ventricular, basal-mid inferior wall and inferolateral wall.   2. Right ventricular systolic function is mildly reduced. The right  ventricular size is normal.   3. Left atrial size was severely dilated.   4. The mitral  valve is abnormal. Moderate mitral valve regurgitation.   5. The aortic valve is tricuspid. Aortic valve regurgitation is not  visualized.   6. Aortic dilatation noted. There is moderate dilatation of the ascending  aorta, measuring 47 mm. There is mild dilatation of the aortic root,  measuring 42 mm.   7. The inferior vena cava is normal in size with greater than 50%  respiratory variability, suggesting right atrial pressure of 3 mmHg.   Comparison(s): Changes from prior study are noted. 02/11/2022 [facility]):  LVEF was 35-40%, global hypokinesis worse laterally.    LHC from 02/11/22 at [facility]:  Coronary Angiography Findings:  --LM: Minimal luminal irregularities  --LAD: Mild, diffuse, non-obstructive CAD, D1 80%, D2 75% and 100% of  inferior branch of D2 with L-L collaterals  --LCx: Ostial 100%, very faint and late L-L collaterals to likely OM  --RCA: Prox 99%, mid 80, heavily calcified, PDA 40%  --AV crossed: LVEDP: 18 mmHg    Intervention: Coronary angiogram showed culprit lesion in the likely the  RCA, however was heavily calcified. Additional Heparin given and loaded  with Brilinta. Inserted JR4 guide and wired across lesion with a BMW,  however had some difficulty wiring further down the vessel, therefore  inserted a Corsair (150cm) microcatheter and used a 3.0x62m compliant  balloon to trap the wire in place of the guide and advanced the  microcatheter down the vessel. Then exchanged the BMW for a Fielder XT and  was able to wire down distally, then advanced the Corsair down to the  distal vessel and exchanged the F[facility] Med CtrXT wire for a Wiggle wire, then  removed the microcatheter. Then inserted a 6Fr guideliner. Nex, inserted a  2.5x155mcompliant balloon and performed PTCA at high pressure. Next,  inserted a 3.0x1248mC and performed PTCA in the proximal lesion again due  to severe calcium. Next, decided to proceed with IVL with 3.0 Shockwave  and performed 8 treatments at 10 pulses each. Next, inserted a 3.0x18m64mience DES and deployed at above nominal pressure. Repeat angiogram showed  adequate result given the heavy calcium. Next, inserted a 2.5x8mm 55mmpliant balloon and performed PTCA at the more mid lesion at very high  pressure. Next, inserted a 2.75x15mm 43mce DES, however was unable to  cross the lesion. Given the amount of contrast and >3.5Gy of radiation,  elected to stop. ACT level at therapeutic range during case.  Pre-procedural stenosis was 99% with TIMI II flow, and post-procedural  stenosis was 0% with TIMI III flow. There is still 50% lesion in the  mid-RCA.   Comments: Cath showed severe mvCAD, not including the LAD. Heavy  fluoroscopic calcium noted in the RCA, however given the severity of the  lesion elected to proceed with PCI. DAPT for minimum of 1 year, then ASA  indefinitely. Has already been loaded with Brilinta '180mg'$  x1 in the cath  lab at time of PCI. Needs aggressive  medical therapy for CAD. AV crossed,  LVEDP was elevated.      If patient has recurrent angina, would go femoral access in the future  for better support, and needs plaque modification with rotational  atherectomy, however based on anatomy and heavy calcific vessels, and  couple of CTO vessels, would likely be better suited for CABG in future.   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   Assessment & Plan    NSTEMI CAD with hx of PTCA to RCA 02/11/22  - presented with chest pain, SOB, found unresponsive during welfare check  - HS trop peaked 6096 - EKG with new onset A fib, TWI appears new laterally  - Echo showed LVEF 40-45%, moderate hypokinesis of LV, basal-mid inferior wall and inferolateral wall, mildly reduced RV, severe LAE, moderate MR - plan for LHC today , AKI is improving  - continue medical therapy with ASA '81mg'$ , Brilinta '90mg'$  BID, Metoprolol XL 12.'5mg'$  daily, lipitor '40mg'$  daily, and heparin gtt  Ischemic cardiomyopathy  - clinically euvolemic today  - Echo as above  - GDMT: continue Metprolol XL, resume lisinopril, jardiance when AKI resolves/BP allowing, does not appears BP has room adding MRA  Atrial fibrillation - new diagnosis this admission - rate controlled on metoprolol, asymptomatic    - started on heparin gtt , would transitioned to PO Eliquis at discharge. CHADS2VASc is 7   AKI Type 2 DM Anemia  OSA BPH PVD Hx of left BKA  Hx of CVA - per primary team     For questions or updates, please contact [place] Please consult [email or web address] for contact info under        Signed, Margie Billet, NP  06/17/2022, 9:58 AM

## 2022-06-17 NOTE — Progress Notes (Signed)
Mobility Specialist Progress Note:   06/17/22 1127  Mobility  Activity Off unit   Will follow-up as time allows.   [facility] Shawnta Zimbelman Mobility Specialist

## 2022-06-17 NOTE — Progress Notes (Signed)
Patient having nose bleed Pharmacy and MD aware. Will continue to monitor.

## 2022-06-17 NOTE — Interval H&P Note (Signed)
History and Physical Interval Note:  06/17/2022 10:52 AM  Peter Yang  has presented today for surgery, with the diagnosis of NSTEMI.  The various methods of treatment have been discussed with the patient and family. After consideration of risks, benefits and other options for treatment, the patient has consented to  Procedure(s): LEFT HEART CATH AND CORONARY ANGIOGRAPHY (N/A) as a surgical intervention.  [name] history has been reviewed, patient examined, no change in status, stable for surgery.  I have reviewed the patient's chart and labs.  Questions were answered to the patient's satisfaction.  He has agreed to rescind his DNR order and will be full code for the duration of the procedure.  Cath Lab Visit (complete for each Cath Lab visit)  Clinical Evaluation Leading to the Procedure:   ACS: Yes.    Non-ACS:  N/A  [name]

## 2022-06-18 ENCOUNTER — Encounter (HOSPITAL_COMMUNITY): Payer: Self-pay | Admitting: Internal Medicine

## 2022-06-18 ENCOUNTER — Telehealth (HOSPITAL_COMMUNITY): Payer: Self-pay | Admitting: Pharmacy Technician

## 2022-06-18 ENCOUNTER — Other Ambulatory Visit (HOSPITAL_COMMUNITY): Payer: Self-pay

## 2022-06-18 LAB — GLUCOSE, CAPILLARY
Glucose-Capillary: 198 mg/dL — ABNORMAL HIGH (ref 70–99)
Glucose-Capillary: 192 mg/dL — ABNORMAL HIGH (ref 70–99)
Glucose-Capillary: 183 mg/dL — ABNORMAL HIGH (ref 70–99)
Glucose-Capillary: 284 mg/dL — ABNORMAL HIGH (ref 70–99)

## 2022-06-18 LAB — CBC
HCT: 37.5 % — ABNORMAL LOW (ref 39.0–52.0)
Hemoglobin: 12.9 g/dL — ABNORMAL LOW (ref 13.0–17.0)
MCH: 32.8 pg (ref 26.0–34.0)
MCHC: 34.4 g/dL (ref 30.0–36.0)
MCV: 95.4 fL (ref 80.0–100.0)
Platelets: 139 10*3/uL — ABNORMAL LOW (ref 150–400)
RBC: 3.93 MIL/uL — ABNORMAL LOW (ref 4.22–5.81)
RDW: 13.9 % (ref 11.5–15.5)
WBC: 10.8 10*3/uL — ABNORMAL HIGH (ref 4.0–10.5)
nRBC: 0 % (ref 0.0–0.2)

## 2022-06-18 LAB — BASIC METABOLIC PANEL
Anion gap: 8 (ref 5–15)
BUN: 47 mg/dL — ABNORMAL HIGH (ref 8–23)
CO2: 20 mmol/L — ABNORMAL LOW (ref 22–32)
Calcium: 8.8 mg/dL — ABNORMAL LOW (ref 8.9–10.3)
Chloride: 105 mmol/L (ref 98–111)
Creatinine, Ser: 1.14 mg/dL (ref 0.61–1.24)
GFR, Estimated: >60 mL/min (ref >60)
Glucose, Bld: 244 mg/dL — ABNORMAL HIGH (ref 70–99)
Potassium: 3.9 mmol/L (ref 3.5–5.1)
Sodium: 133 mmol/L — ABNORMAL LOW (ref 135–145)

## 2022-06-18 LAB — HEPARIN LEVEL (UNFRACTIONATED): Heparin Unfractionated: 0.2 IU/mL — ABNORMAL LOW (ref 0.30–0.70)

## 2022-06-18 MED ORDER — CLOPIDOGREL BISULFATE 75 MG PO TABS
300.0000 mg | ORAL_TABLET | Freq: Once | ORAL | Status: AC
  Administered 2022-06-18: 300 mg via ORAL
  Filled 2022-06-18: qty 4

## 2022-06-18 MED ORDER — APIXABAN 5 MG PO TABS
5.0000 mg | ORAL_TABLET | Freq: Two times a day (BID) | ORAL | 0 refills | Status: DC
  Filled 2022-06-18: qty 60, 30d supply, fill #0

## 2022-06-18 MED ORDER — METOPROLOL SUCCINATE ER 25 MG PO TB24
12.5000 mg | ORAL_TABLET | Freq: Every day | ORAL | 0 refills | Status: DC
Start: 2022-06-19 — End: 2022-06-30
  Filled 2022-06-18: qty 15, 30d supply, fill #0

## 2022-06-18 MED ORDER — CLOPIDOGREL BISULFATE 75 MG PO TABS
75.0000 mg | ORAL_TABLET | Freq: Every day | ORAL | 0 refills | Status: AC
  Filled 2022-06-18: qty 30, 30d supply, fill #0

## 2022-06-18 MED ORDER — APIXABAN 5 MG PO TABS
5.0000 mg | ORAL_TABLET | Freq: Two times a day (BID) | ORAL | Status: DC
  Administered 2022-06-18: 5 mg via ORAL
  Filled 2022-06-18: qty 1

## 2022-06-18 MED ORDER — EMPAGLIFLOZIN 10 MG PO TABS
10.0000 mg | ORAL_TABLET | Freq: Every day | ORAL | Status: DC
  Administered 2022-06-18: 10 mg via ORAL
  Filled 2022-06-18: qty 1

## 2022-06-18 MED ORDER — EMPAGLIFLOZIN 10 MG PO TABS
10.0000 mg | ORAL_TABLET | Freq: Every day | ORAL | 0 refills | Status: DC
  Filled 2022-06-18: qty 30, 30d supply, fill #0

## 2022-06-18 MED ORDER — CLOPIDOGREL BISULFATE 75 MG PO TABS: 75.0000 mg | ORAL_TABLET | Freq: Every day | ORAL | Status: DC

## 2022-06-18 MED ORDER — TRAZODONE HCL 50 MG PO TABS
50.0000 mg | ORAL_TABLET | Freq: Every evening | ORAL | Status: DC | PRN
  Administered 2022-06-18: 50 mg via ORAL
  Filled 2022-06-18: qty 1

## 2022-06-18 NOTE — Inpatient Diabetes Management (Signed)
Inpatient Diabetes Program Recommendations  AACE/ADA: New Consensus Statement on Inpatient Glycemic Control (2015)  Target Ranges:  Prepandial:   less than 140 mg/dL      Peak postprandial:   less than 180 mg/dL (1-2 hours)      Critically ill patients:  140 - 180 mg/dL   Lab Results  Component Value Date   GLUCAP 192 (H) 06/18/2022   HGBA1C 7.5 (H) 06/15/2022    Review of Glycemic Control  Diabetes history: DM 2 Outpatient Diabetes medications: Tresiba 40 units, Jardiance 25 mg Daily, Metformin 1000 mg bid, Freestyle libre Current orders for Inpatient glycemic control:  Novolog 0-9 units tid Jardiance 10 mg Daily  A1c 7.5% on 8/26    Spoke with pt regarding his Hypoglycemia at home. Pt reports having hypoglycemia 1-2 mornings a week. May need titration of Tresiba dose for home. Encouraged pt to call his Endocrinologist in [place] for titration.  Thanks,  Tama Headings RN, MSN, BC-ADM Inpatient Diabetes Coordinator Team Pager [phone] (8a-5p)

## 2022-06-18 NOTE — Progress Notes (Signed)
Patient pulled IV at approximately 0600; two unsuccessful attempts made at establishing new IV access in the left posterior forearm. IV team order placed, on-call physician notified.

## 2022-06-18 NOTE — Progress Notes (Signed)
ANTICOAGULATION CONSULT NOTE  Pharmacy Consult for Heparin Indication: chest pain/ACS with multivessel CAD awaiting definitive treatment plan    (note: also has new onset Afib)   No Known Allergies  Patient Measurements: Height: 6' 3.98" (193 cm) Weight: 95.3 kg (210 lb 1.6 oz) IBW/kg (Calculated) : 86.76 Heparin Dosing Weight: 88.9 kg  Vital Signs: Temp: 99 F (37.2 C) (08/29 0357) Temp Source: Oral (08/29 0357) BP: 107/69 (08/29 0357) Pulse Rate: 95 (08/29 0357)  Labs: Recent Labs    06/15/22 1135 06/15/22 1522 06/15/22 2338 06/16/22 0330 06/16/22 0916 06/16/22 1231 06/16/22 1715 06/17/22 0726 06/18/22 0342  HGB  --   --    < > 14.7  --   --   --  12.9* 12.9*  HCT  --   --   --  41.7  --   --   --  38.5* 37.5*  PLT  --   --   --  158  --   --   --  132* 139*  HEPARINUNFRC 0.22*  --    < >  --    < >  --  0.25* 0.37 0.20*  CREATININE  --   --    < > 1.26*  --  1.32*  --  1.29* 1.14  TROPONINIHS 6,096* 5,251*  --   --   --   --   --   --   --    < > = values in this interval not displayed.     Estimated Creatinine Clearance: 79.3 mL/min (by C-G formula based on SCr of 1.14 mg/dL).  Assessment: 26 yom with a history of DES RCA 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 Patient  presented on 06/15/22 with hypoglycemia and chest pain. Heparin per pharmacy consult placed for chest pain/ACS.  s/p cath 8/28 showed  severe multi vessel CAD.  Not a candidate for PCI or CABG. Cardiologist's plan:  12 months of plavix + Eliquis 5 mg BID in setting of AFib and NSTEMI. Discontinue PTA ticagrelor. Had nose bleeding post cath 06/17/22.   No bleeding overnight 8/28>8/29.  CBC:  Hgb 17.3 >>12.9>12.9 stable; pltc 245>>132>139 low/stable  Pharmacy consulted to switch from IV heparin to oral Eliquis.   Goal of Therapy:  Monitor platelets by anticoagulation protocol: Yes   Plan:  DC  IV Heparin drip and start Eliquis 5 mg bid  (new) Monitor for s/sx of bleeding.  Will educate patient about Eliquis prior to discharge.  Copay check requested.  Recommend to send 1st Rx to [place]     Thank you for allowing pharmacy to be part of this patients care team.. Nicole Cella, [place] Pharmacist [phone] 06/18/2022 10:44 AM Please check AMION for all [place] phone numbers After 10:00 PM, call [place]

## 2022-06-18 NOTE — Discharge Instructions (Signed)

## 2022-06-18 NOTE — TOC Benefit Eligibility Note (Addendum)
Patient Teacher, English as a foreign language completed.    The patient is currently admitted and upon discharge could be taking Eliquis 5 mg.  Requires Prior Authorization  The patient is currently admitted and upon discharge could be taking clopidogrel (Plavix) 75 mg.  30 day copay is $5.74  The patient is insured through [employer], [place] Patient Advocate Specialist [place] Patient Advocate Team Direct Number: [phone]  Fax: [phone]

## 2022-06-18 NOTE — Plan of Care (Signed)

## 2022-06-18 NOTE — Progress Notes (Signed)
Physical Therapy Treatment Patient Details Name: Peter Yang MRN: [id number] DOB: 1956-06-21 Today's Date: 06/18/2022   History of Present Illness 66 y.o. male presents to [facility] hospital on 06/15/2022 after being found unresponsive during welfare check. Pt reports having chest pain and SOB, found to be hypoglycemic. Pt noted to have ST depression and elevated troponins, admitted for NSTEMI. PMH includes BPH, DM, HTN, L BKA, CAD, HLD.    PT Comments    Pt had just mobilized with mobility team so session focused on exercises and standing balance. Pt performed seated, SL, and standing there ex for LLE. Pt with L hamstring tightness. Discussed this in relation to wearing his prosthesis. L knee positioned in elevated extension after session. Pt encouraged ro keep trying to wear L prosthesis and work on fit, he has had difficulty with it since last hospitalization. PT will continue to follow.    Recommendations for follow up therapy are one component of a multi-disciplinary discharge planning process, led by the attending physician.  Recommendations may be updated based on patient status, additional functional criteria and insurance authorization.  Follow Up Recommendations  Home health PT     Assistance Recommended at Discharge PRN  Patient can return home with the following A little help with bathing/dressing/bathroom;Assist for transportation;Help with stairs or ramp for entrance   Equipment Recommendations  None recommended by PT    Recommendations for Other Services       Precautions / Restrictions Precautions Precautions: Fall Precaution Comments: L BKA, 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 and does not currently fit well since swelling went down Restrictions Weight Bearing Restrictions: No     Mobility  Bed Mobility Overal bed mobility: Modified Independent                  Transfers Overall transfer level: Needs assistance Equipment used: Rolling walker (2 wheels) Transfers: Sit  to/from Stand Sit to Stand: Min assist           General transfer comment: needed min A for power up, practiced multiple times for strengthening and standing exercise    Ambulation/Gait               General Gait Details: deferred as pt had just worked with mobility team. Hopped R alonf EOB 3'   Stairs             Wheelchair Mobility    Modified Rankin (Stroke Patients Only)       Balance Overall balance assessment: Needs assistance Sitting-balance support: No upper extremity supported, Feet supported Sitting balance-Leahy Scale: Good     Standing balance support: Bilateral upper extremity supported, Reliant on assistive device for balance Standing balance-Leahy Scale: Poor                              Cognition Arousal/Alertness: Awake/alert Behavior During Therapy: WFL for tasks assessed/performed Overall Cognitive Status: Within Functional Limits for tasks assessed                                          Exercises Total Joint Exercises Quad Sets: AROM, Left, 10 reps, Seated Hip ABduction/ADduction: AROM, Left, 10 reps, Sidelying (with manual resistance) Standing Hip Extension: AROM, Left, 10 reps, Standing Bridges: AROM, 5 reps Other Exercises Other Exercises: L knee extension stretch 30 secs x3 Other Exercises: STM to L hamstring Other Exercises:  resisted L knee extension x10    General Comments General comments (skin integrity, edema, etc.): HR up to 120 bpm with activity, low 100's at rest. SPO2 remained stable in 90's on RA. Discussed use of prosthetic and fit      Pertinent Vitals/Pain Pain Assessment Pain Assessment: Faces Faces Pain Scale: Hurts little more Pain Location: generalized Pain Descriptors / Indicators: Aching Pain Intervention(s): Limited activity within patient's tolerance, Monitored during session    Home Living                          Prior Function            PT Goals  (current goals can now be found in the care plan section) Acute Rehab PT Goals Patient Stated Goal: to return home PT Goal Formulation: With patient Time For Goal Achievement: 06/30/22 Potential to Achieve Goals: Good Progress towards PT goals: Progressing toward goals    Frequency    Min 3X/week      PT Plan Current plan remains appropriate    Co-evaluation              AM-PAC PT "6 Clicks" Mobility   Outcome Measure  Help needed turning from your back to your side while in a flat bed without using bedrails?: None Help needed moving from lying on your back to sitting on the side of a flat bed without using bedrails?: None Help needed moving to and from a bed to a chair (including a wheelchair)?: A Little Help needed standing up from a chair using your arms (e.g., wheelchair or bedside chair)?: A Little Help needed to walk in hospital room?: A Little Help needed climbing 3-5 steps with a railing? : Total 6 Click Score: 18    End of Session   Activity Tolerance: Patient tolerated treatment well Patient left: in bed;with call bell/phone within reach Nurse Communication: Mobility status PT Visit Diagnosis: Other abnormalities of gait and mobility (R26.89);Muscle weakness (generalized) (M62.81)     Time: 7829-5621 PT Time Calculation (min) (ACUTE ONLY): 31 min  Charges:  $Therapeutic Exercise: 23-37 mins                     Leighton Roach, PT  Acute Rehab Services Secure chat preferred Office [place] 06/18/2022, 4:37 PM

## 2022-06-18 NOTE — Progress Notes (Signed)
ANTICOAGULATION CONSULT NOTE  Pharmacy Consult for Heparin Indication: chest pain/ACS with multivessel CAD awaiting definitive treatment plan    (note: also has new onset Afib)   No Known Allergies  Patient Measurements: Height: 6' 3.98" (193 cm) Weight: 95.3 kg (210 lb 1.6 oz) IBW/kg (Calculated) : 86.76 Heparin Dosing Weight: 88.9 kg  Vital Signs: Temp: 99 F (37.2 C) (08/29 0357) Temp Source: Oral (08/29 0357) BP: 107/69 (08/29 0357) Pulse Rate: 95 (08/29 0357)  Labs: Recent Labs    06/15/22 1135 06/15/22 1522 06/15/22 2338 06/16/22 0330 06/16/22 0916 06/16/22 1231 06/16/22 1715 06/17/22 0726 06/18/22 0342  HGB  --   --    < > 14.7  --   --   --  12.9* 12.9*  HCT  --   --   --  41.7  --   --   --  38.5* 37.5*  PLT  --   --   --  158  --   --   --  132* 139*  HEPARINUNFRC 0.22*  --    < >  --    < >  --  0.25* 0.37 0.20*  CREATININE  --   --    < > 1.26*  --  1.32*  --  1.29* 1.14  TROPONINIHS 6,096* 5,251*  --   --   --   --   --   --   --    < > = values in this interval not displayed.     Estimated Creatinine Clearance: 79.3 mL/min (by C-G formula based on SCr of 1.14 mg/dL).  Assessment: 36 yom with a history of DES RCA 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 Patient  presented on 06/15/22 with hypoglycemia and chest pain. Heparin per pharmacy consult placed for chest pain/ACS.  Patient is s/p cath and being evaluated for CABG vs medical therapy vs PCI.  Pharmacy initially consulted to resume heparin 2 hours post radial band removal; however, RN reports nosebleed when patient sits upright.  Reached out to Cardiologist and Pharmacy to resume IV heparin 4 hours post radial band removal.  Afrin x 3 days per TRH.  TR band removed at 1630 per RN.  8/29 AM update: Heparin level low No nosebleeds overnight   Goal of Therapy:  Heparin level 0.3-0.5 units/ml given nosebleed Monitor platelets by anticoagulation  protocol: Yes   Plan:  Inc heparin to 1650 units/hr 1400 heparin level F/U treatment plan  Narda Bonds, PharmD, BCPS Clinical Pharmacist Phone: [phone]

## 2022-06-18 NOTE — Discharge Summary (Signed)
Physician Discharge Summary   Patient: Peter Yang MRN: [id number] DOB: 1956-10-05  Admit date:     06/15/2022  Discharge date: 06/18/22  Discharge Physician: Marylu Lund   PCP: [name], MD   Recommendations at discharge:    Follow up with PCP as scheduled Follow up with Cardiology as scheduled  Discharge Diagnoses: Principal Problem:   NSTEMI (non-ST elevated myocardial infarction) ([place]) Active Problems:   BPH with obstruction/lower urinary tract symptoms   Uncontrolled type 2 diabetes mellitus with hypoglycemia without coma, with long-term current use of insulin (HCC)   Essential hypertension   AKI (acute kidney injury) ([place])   New onset atrial fibrillation (HCC)   Dyslipidemia   PVD (peripheral vascular disease) (HCC)   Pressure ulcer of sacral region, stage 2 (HCC)   Chronic systolic CHF (congestive heart failure) ([place])   DNR (do not resuscitate)  Resolved Problems:   * No resolved hospital problems. *  Hospital Course: 66 y.o. male with medical history significant of BPH; DM; HTN; PVD s/p L BKA; CAD s/p stent; and HLD presenting with hypoglycemia - found basically unresponsive during welfare check.  He reports that he was feeling SOB and chest pains.  He called 911 and got an ambulance.  The symptoms this AM lasted maybe an hour.  Symptoms are still present but improved.  He was given NTG in the ambulance. Initial troponin of 1400, with repeat value trending up to 2500.  EKG reportedly notable for new ST depression  Assessment and Plan: NSTEMI -Patient with exertional chest pain/SOB  -Trop peaked to 6096, down to 5252 on most recent check -Cardiology consulted, now s/p heart cath 8/28. Findings reviewed, notable for severe multivessel CAD with sereve calcification.  -Cardiology discussed with CTS. Determined that there are not any good targets for PCI and not candidate for CABG. Recs for medical management per Cardiology -Cardiology recs to hold lisinopril at d/c,  to f/u later as outpatient to determine if ACEI/spironolactone can be started then based on BP and renal function   DM with hypoglycemia -Patient reports taking tresiba nightly with resultant glucose 20-30s -He does not have [place] on his med list and this was not filled recently per the pharmacy, but his glucose was 30 with EMS -A1c is 7.5 -Glycemic trends stable this visit   AKI -Given his known systolic CHF, will be judicious - 50 cc/hr x 10 hours -Hold lisinopril, Jardiance, metformin -Renal function improved   New onset afib -Rate controlled -Ws on on heparin initially this visit -Discharged on eliquis on d/c   HTN -Continue amlodipine, Toprol XL -Will also add prn hydralazine   HLD -Continue Lipitor -Check lipids   PVD -s/p L BKA, healing well -His RLE does not have obvious foot wounds currently but he appears to be at high risk given his hygiene -He reports that PT came to do home care and it was going well but she found him profoundly hypoglycemic on one visit and it scared her so much she refused to ever come back -HHPT/OT recommended by therapy   BPH -Continue Proscar   Wounds -Sacral pressure ulcer and RLE ulcer, both in need of attention -Wound care consulted   Chronic systolic CHF -EF 23-76% on last echo on 02/11/22 -seems stable   DNR -Dr. Lorin Mercy discussed code status with the patient and he would prefer to die a natural death should that situation arise.         Consultants: Cardiology Procedures performed: Heart cath  Disposition: Home  Diet recommendation:  Cardiac diet DISCHARGE MEDICATION: Allergies as of 06/18/2022   No Known Allergies      Medication List     STOP taking these medications    amLODipine 5 MG tablet Commonly known as: NORVASC   aspirin EC 81 MG tablet   Brilinta 90 MG Tabs tablet Generic drug: ticagrelor   furosemide 40 MG tablet Commonly known as: LASIX   levETIRAcetam 500 MG tablet Commonly known as:  KEPPRA   lisinopril 2.5 MG tablet Commonly known as: ZESTRIL   potassium chloride SA 20 MEQ tablet Commonly known as: KLOR-CON M       TAKE these medications    acetaminophen 325 MG tablet Commonly known as: TYLENOL Take 2 tablets (650 mg total) by mouth every 6 (six) hours as needed for mild pain (or Fever >/= 101).   apixaban 5 MG Tabs tablet Commonly known as: ELIQUIS Take 1 tablet (5 mg total) by mouth 2 (two) times daily.   atorvastatin 40 MG tablet Commonly known as: LIPITOR Take 40 mg by mouth daily.   clopidogrel 75 MG tablet Commonly known as: PLAVIX Take 1 tablet (75 mg total) by mouth daily. Start taking on: June 19, 2022   diclofenac Sodium 1 % Gel Commonly known as: VOLTAREN Apply 2 g topically 2 (two) times daily as needed (pain).   empagliflozin 10 MG Tabs tablet Commonly known as: JARDIANCE Take 1 tablet (10 mg total) by mouth daily. Start taking on: June 19, 2022 What changed:  medication strength how much to take   finasteride 5 MG tablet Commonly known as: PROSCAR Take 5 mg by mouth daily.   gabapentin 300 MG capsule Commonly known as: NEURONTIN Take 300 mg by mouth.   metFORMIN 500 MG tablet Commonly known as: GLUCOPHAGE Take 1,000 mg by mouth 2 (two) times daily with a meal.   metoprolol succinate 25 MG 24 hr tablet Commonly known as: TOPROL-XL Take 0.5 tablets (12.5 mg total) by mouth daily. Start taking on: June 19, 2022   oxyCODONE 5 MG immediate release tablet Commonly known as: Oxy IR/ROXICODONE Take by mouth.        [facility] Follow up.   Specialty: [place] Why: HHRN,HHPT,HHOT- agency will call you to set up apts Contact information: [place] [address] [phone]         Blenda Bridegroom, MD Follow up.   Specialty: Cardiology Why: Arrange a follow up appointment with your usual cardiologist. Contact  information: [place] [address] [phone]         [name], MD Follow up.   Specialty: Internal Medicine Contact information: [address] [place] [address] [phone]                Discharge Exam: Danley Danker Weights   06/16/22 0532 06/17/22 0528 06/18/22 0357  Weight: 95.2 kg 94.7 kg 95.3 kg   General exam: Awake, laying in bed, in nad Respiratory system: Normal respiratory effort, no wheezing Cardiovascular system: regular rate, s1, s2 Gastrointestinal system: Soft, nondistended, positive BS Central nervous system: CN2-12 grossly intact, strength intact Extremities: Perfused, no clubbing, s/p LLE amputation Skin: Normal skin turgor, no notable skin lesions seen Psychiatry: Mood normal // no visual hallucinations   Condition at discharge: fair  The results of significant diagnostics from this hospitalization (including imaging, microbiology, ancillary and laboratory) are listed below for reference.  Imaging Studies: CARDIAC CATHETERIZATION  Result Date: 06/17/2022 Conclusions: Severe multivessel coronary artery disease with severe calcification, as detailed below, including 50% mid LAD stenosis, 95% stenosis of dominant branch of large D1 and chronic total occlusion of lateral branch of D1, chronic total occlusion of LCx, and chronic total occlusion of ostial through mid RCA (including stent placed in 01/2022) with left-to-right collaterals. Normal left ventricular filling pressure (LVEDP 10 mmHg). Recommendations: Consider viability study with particular attention to the inferior wall.  If RCA territory is viable, cardiac surgery consultation for CABG should be considered.  If RCA territory is not viable (or the patient is not a candidate for CABG), medical therapy versus PCI to dominant branch of D1 (particularly if the patient reports recurrent angina) should be considered. Hold ticagrelor pending further workup and possible cardiac  surgery consultation.  If the patient does not undergo CABG, I would favor 12 months of clopidogrel + DOAC therapy in the setting of new atrial fibrillation and NSTEMI. Aggressive secondary prevention of coronary artery disease. Nelva Bush, MD [facility]  ECHOCARDIOGRAM COMPLETE  Result Date: 06/15/2022    ECHOCARDIOGRAM REPORT   Patient Name:   Stirling Orton Date of Exam: 06/15/2022 Medical Rec #:  [id number]      Height:       76.0 in Accession #:    [id number]     Weight:       196.0 lb Date of Birth:  Sep 26, 1956       BSA:          2.196 m Patient Age:    66 years       BP:           89/58 mmHg Patient Gender: M              HR:           89 bpm. Exam Location:  Inpatient Procedure: 2D Echo, Cardiac Doppler, Color Doppler and Intracardiac            Opacification Agent Indications:    NSTEMI  History:        Patient has no prior history of Echocardiogram examinations.                 CAD; Signs/Symptoms:Chest Pain. Based on information from the                 patient, a heart cath was done at [facility] a few months ago                 with unsuccessful PCI. BKA.  Sonographer:    Merrie Roof RDCS Referring Phys: [place]  1. Left ventricular ejection fraction, by estimation, is 40 to 45%. The left ventricle has mildly decreased function. The left ventricle demonstrates regional wall motion abnormalities (see scoring diagram/findings for description). There is mild left ventricular hypertrophy. Left ventricular diastolic function could not be evaluated. There is moderate hypokinesis of the left ventricular, basal-mid inferior wall and inferolateral wall.  2. Right ventricular systolic function is mildly reduced. The right ventricular size is normal.  3. Left atrial size was severely dilated.  4. The mitral valve is abnormal. Moderate mitral valve regurgitation.  5. The aortic valve is tricuspid. Aortic valve regurgitation is not visualized.  6. Aortic dilatation noted. There is  moderate dilatation of the ascending aorta, measuring 47 mm. There is mild dilatation of the aortic root, measuring 42 mm.  7. The inferior vena cava is normal  in size with greater than 50% respiratory variability, suggesting right atrial pressure of 3 mmHg. Comparison(s): Changes from prior study are noted. 02/11/2022 [facility]): LVEF 35-40%, global hypokinesis worse laterally. FINDINGS  Left Ventricle: Left ventricular ejection fraction, by estimation, is 40 to 45%. The left ventricle has mildly decreased function. The left ventricle demonstrates regional wall motion abnormalities. Moderate hypokinesis of the left ventricular, basal-mid inferior wall and inferolateral wall. Definity contrast agent was given IV to delineate the left ventricular endocardial borders. The left ventricular internal cavity size was normal in size. There is mild left ventricular hypertrophy. Left ventricular diastolic function could not be evaluated due to atrial fibrillation. Left ventricular diastolic function could not be evaluated. Right Ventricle: The right ventricular size is normal. No increase in right ventricular wall thickness. Right ventricular systolic function is mildly reduced. Left Atrium: Left atrial size was severely dilated. Right Atrium: Right atrial size was normal in size. Pericardium: There is no evidence of pericardial effusion. Mitral Valve: The mitral valve is abnormal. Moderate mitral valve regurgitation, with centrally-directed jet. Tricuspid Valve: The tricuspid valve is grossly normal. Tricuspid valve regurgitation is not demonstrated. Aortic Valve: The aortic valve is tricuspid. Aortic valve regurgitation is not visualized. Pulmonic Valve: The pulmonic valve was grossly normal. Pulmonic valve regurgitation is trivial. Aorta: Aortic dilatation noted. There is moderate dilatation of the ascending aorta, measuring 47 mm. There is mild dilatation of the aortic root, measuring 42 mm. Venous: The inferior vena cava  is normal in size with greater than 50% respiratory variability, suggesting right atrial pressure of 3 mmHg. IAS/Shunts: The interatrial septum was not well visualized.  LEFT VENTRICLE PLAX 2D LVIDd:         6.20 cm LVIDs:         4.60 cm LV PW:         0.60 cm LV IVS:        1.10 cm LVOT diam:     2.50 cm LV SV:         40 LV SV Index:   18 LVOT Area:     4.91 cm  RIGHT VENTRICLE RV Basal diam:  3.60 cm RV S prime:     9.90 cm/s TAPSE (M-mode): 1.6 cm LEFT ATRIUM            Index        RIGHT ATRIUM           Index LA diam:      4.60 cm  2.09 cm/m   RA Area:     16.70 cm LA Vol (A2C): 48.5 ml  22.08 ml/m  RA Volume:   43.40 ml  19.76 ml/m LA Vol (A4C): 130.0 ml 59.19 ml/m  AORTIC VALVE LVOT Vmax:   55.90 cm/s LVOT Vmean:  35.700 cm/s LVOT VTI:    0.082 m  AORTA Ao Root diam: 4.20 cm Ao Asc diam:  4.70 cm  SHUNTS Systemic VTI:  0.08 m Systemic Diam: 2.50 cm Lyman Bishop MD Electronically signed by Lyman Bishop MD Signature Date/Time: 06/15/2022/5:25:47 PM    Final    CT Head Wo Contrast  Result Date: 06/15/2022 CLINICAL DATA:  Altered mental status EXAM: CT HEAD WITHOUT CONTRAST TECHNIQUE: Contiguous axial images were obtained from the base of the skull through the vertex without intravenous contrast. RADIATION DOSE REDUCTION: This exam was performed according to the departmental dose-optimization program which includes automated exposure control, adjustment of the mA and/or kV according to patient size and/or use of iterative reconstruction technique.  COMPARISON:  None Available. FINDINGS: Brain: There is no mass, hemorrhage or extra-axial collection. There is generalized atrophy without lobar predilection. Hypodensity of the white matter is most commonly associated with chronic microvascular disease. There are old right parietal and cerebellar infarcts. Vascular: Atherosclerotic calcification of the internal carotid arteries at the skull base. No abnormal hyperdensity of the major intracranial arteries  or dural venous sinuses. Skull: The visualized skull base, calvarium and extracranial soft tissues are normal. Sinuses/Orbits: No fluid levels or advanced mucosal thickening of the visualized paranasal sinuses. No mastoid or middle ear effusion. The orbits are normal. IMPRESSION: 1. No acute intracranial abnormality. 2. 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 Electronically Signed   By: Ulyses Jarred M.D.   On: 06/15/2022 04:00   DG Chest Portable 1 View  Result Date: 06/15/2022 CLINICAL DATA:  Altered mental status EXAM: PORTABLE CHEST 1 VIEW COMPARISON:  02/14/2022 FINDINGS: The heart size and mediastinal contours are within normal limits. Both lungs are clear. The visualized skeletal structures are unremarkable. IMPRESSION: No active disease. Electronically Signed   By: [name] M.D.   On: 06/15/2022 03:00    Microbiology: Results for orders placed or performed during the hospital encounter of 07/27/21  Blood Culture (routine x 2)     Status: None   Collection Time: 07/27/21  3:07 PM   Specimen: BLOOD RIGHT WRIST  Result Value Ref Range Status   Specimen Description BLOOD RIGHT WRIST  Final   Special Requests   Final    BOTTLES DRAWN AEROBIC AND ANAEROBIC Blood Culture results may not be optimal due to an inadequate volume of blood received in culture bottles   Culture   Final    NO GROWTH 5 DAYS Performed at [facility], [place] [address] [place], [place] [address]    Report Status 08/01/2021 FINAL  Final  Resp Panel by RT-PCR (Flu A&B, Covid) Nasopharyngeal Swab     Status: None   Collection Time: 07/27/21  3:15 PM   Specimen: Nasopharyngeal Swab; Nasopharyngeal(NP) swabs in vial transport medium  Result Value Ref Range Status   SARS Coronavirus 2 by RT PCR NEGATIVE NEGATIVE Final    Comment: (NOTE) SARS-CoV-2 target nucleic acids are NOT DETECTED.  The SARS-CoV-2 RNA is generally detectable in upper respiratory specimens  during the acute phase of infection. The lowest concentration of SARS-CoV-2 viral copies this assay can detect is 138 copies/mL. A negative result does not preclude SARS-Cov-2 infection and should not be used as the sole basis for treatment or other patient management decisions. A negative result may occur with  improper specimen collection/handling, submission of specimen other than nasopharyngeal swab, presence of viral mutation(s) within the areas targeted by this assay, and inadequate number of viral copies(<138 copies/mL). A negative result must be combined with clinical observations, patient history, and epidemiological information. The expected result is Negative.  Fact Sheet for Patients:  EntrepreneurPulse.com.au  Fact Sheet for Healthcare Providers:  IncredibleEmployment.be  This test is no t yet approved or cleared by the [place] FDA and  has been authorized for detection and/or diagnosis of SARS-CoV-2 by FDA under an Emergency Use Authorization (EUA). This EUA will remain  in effect (meaning this test can be used) for the duration of the COVID-19 declaration under Section 564(b)(1) of the Act, 21 U.S.C.section 360bbb-3(b)(1), unless the authorization is terminated  or revoked sooner.       Influenza A by PCR NEGATIVE NEGATIVE Final  Influenza B by PCR NEGATIVE NEGATIVE Final    Comment: (NOTE) The Xpert Xpress SARS-CoV-2/FLU/RSV plus assay is intended as an aid in the diagnosis of influenza from Nasopharyngeal swab specimens and should not be used as a sole basis for treatment. Nasal washings and aspirates are unacceptable for Xpert Xpress SARS-CoV-2/FLU/RSV testing.  Fact Sheet for Patients: EntrepreneurPulse.com.au  Fact Sheet for Healthcare Providers: IncredibleEmployment.be  This test is not yet approved or cleared by the [place] FDA and has been authorized for detection  and/or diagnosis of SARS-CoV-2 by FDA under an Emergency Use Authorization (EUA). This EUA will remain in effect (meaning this test can be used) for the duration of the COVID-19 declaration under Section 564(b)(1) of the Act, 21 U.S.C. section 360bbb-3(b)(1), unless the authorization is terminated or revoked.  Performed at [facility], [place] [address], [place], [place] [address]   Urine Culture     Status: Abnormal   Collection Time: 07/27/21  3:34 PM   Specimen: In/Out Cath Urine  Result Value Ref Range Status   Specimen Description IN/OUT CATH URINE  Final   Special Requests   Final    NONE Performed at [facility], [place] [address] [place], [place] [address]    Culture MULTIPLE SPECIES PRESENT, SUGGEST RECOLLECTION (A)  Final   Report Status 07/28/2021 FINAL  Final  Blood Culture (routine x 2)     Status: None   Collection Time: 07/27/21  3:35 PM   Specimen: BLOOD  Result Value Ref Range Status   Specimen Description BLOOD RIGHT ANTECUBITAL  Final   Special Requests   Final    BOTTLES DRAWN AEROBIC AND ANAEROBIC Blood Culture results may not be optimal due to an inadequate volume of blood received in culture bottles   Culture   Final    NO GROWTH 5 DAYS Performed at [facility], [place] [address] [place], [place] [address]    Report Status 08/01/2021 FINAL  Final  Urine Culture     Status: None   Collection Time: 07/30/21 12:42 AM   Specimen: Urine, Clean Catch  Result Value Ref Range Status   Specimen Description URINE, CLEAN CATCH  Final   Special Requests NONE  Final   Culture   Final    NO GROWTH Performed at [facility], [place] [address], [place], [place] [address]    Report Status 07/31/2021 FINAL  Final    Labs: CBC: Recent Labs  Lab 06/15/22 0209 06/16/22 0330 06/17/22 0726 06/18/22 0342  WBC 19.2* 11.5* 8.7 10.8*  NEUTROABS 17.2*  --   --   --   HGB 17.3* 14.7 12.9* 12.9*  HCT 49.9 41.7 38.5* 37.5*  MCV 94.9 93.7 97.7 95.4   PLT 245 158 132* 010*   Basic Metabolic Panel: Recent Labs  Lab 06/15/22 0209 06/16/22 0330 06/16/22 1231 06/17/22 0726 06/18/22 0342  NA 136 135 134* 137 133*  K 4.7 4.7 4.7 4.2 3.9  CL 108 107 106 111 105  CO2 16* 16* 19* 20* 20*  GLUCOSE 94 180* 184* 146* 244*  BUN 74* 57* 55* 48* 47*  CREATININE 1.70* 1.26* 1.32* 1.29* 1.14  CALCIUM 10.3 9.2 9.2 8.6* 8.8*   Liver Function Tests: Recent Labs  Lab 06/15/22 0209  AST 44*  ALT 23  ALKPHOS 69  BILITOT 0.7  PROT 7.4  ALBUMIN 4.0   CBG: Recent Labs  Lab 06/17/22 1214 06/17/22 1540 06/17/22 2014 06/18/22 0641 06/18/22 1100  GLUCAP  146* 212* 216* 198* 192*    Discharge time spent: less than 30 minutes.  Signed: Marylu Lund, MD [facility] 06/18/2022

## 2022-06-18 NOTE — Telephone Encounter (Signed)
Patient Advocate Encounter  Prior Authorization for Eliquis '5MG'$  tablets has been approved.    PA# [id number] Key: [id number] Effective dates: 06/18/2022 through 06/18/2023  Patients co-pay is $30.00.     Lyndel Safe, [place] Patient Advocate Specialist [place] Patient Advocate Team Direct Number: [phone]  Fax: [phone]

## 2022-06-18 NOTE — Telephone Encounter (Signed)
Pharmacy Patient Advocate Encounter  Insurance verification completed.    The patient is insured through [employer]   The patient is currently admitted and ran test claims for the following: Eliquis .  Copays and coinsurance results were relayed to Inpatient clinical team.  

## 2022-06-18 NOTE — TOC Transition Note (Signed)
Transition of Care [facility]) - CM/SW Discharge Note   Patient Details  Name: Peter Yang MRN: [id number] Date of Birth: 1956-02-27  [facility] CM/SW Contact:  Zenon Mayo, RN Phone Number: 06/18/2022, 2:31 PM   Clinical Narrative:    Patient is for dc today, he states he could not get in touch with his friend for tranport, he will need ambulance transport,  NCM scheduled ptar transport for patient.     Final next level of care: [place] Barriers to Discharge: No Barriers Identified   Patient Goals and CMS Choice Patient states their goals for this hospitalization and ongoing recovery are:: return home with [facility] CMS Medicare.gov Compare Post Acute Care list provided to:: Patient Choice offered to / list presented to : Patient  Discharge Placement                       Discharge Plan and Services                  DME Agency: NA       HH Arranged: RN, Disease Management, PT, OT HH Agency: [facility] Date [facility] Agency Contacted: 06/18/22 Time [place]: 48 Representative spoke with at [place]: [place] Determinants of Health ([place]) Interventions     Readmission Risk Interventions     No data to display

## 2022-06-18 NOTE — Progress Notes (Signed)
   06/18/22 1400  Mobility  Activity Ambulated with assistance in room  Level of Assistance Contact guard assist, steadying assist  Assistive Device Front wheel walker  Distance Ambulated (ft) 12 ft  Activity Response Tolerated well  $Mobility charge 1 Mobility   Mobility Specialist Progress Note  Received pt in bed having no complaints and agreeable to mobility. Pt was asymptomatic throughout ambulation and returned to bed w/o fault. Left EOB with Physical Therapy.    Lucious Groves Mobility Specialist

## 2022-06-18 NOTE — Progress Notes (Addendum)
Rounding Note    Patient Name: Peter Yang Date of Encounter: 06/18/2022  [facility] Cardiologist: Dr Maylene Roes, [place]   Subjective   Bleeding has stopped, feeling well this AM. Reviewed plan for apixaban and clopidogrel transition, he is amenable.  Inpatient Medications    Scheduled Meds:  apixaban  5 mg Oral BID   atorvastatin  40 mg Oral Daily   [START ON 06/19/2022] clopidogrel  75 mg Oral Daily   docusate sodium  100 mg Oral BID   empagliflozin  10 mg Oral Daily   feeding supplement  237 mL Oral BID BM   finasteride  5 mg Oral Daily   gabapentin  300 mg Oral BID   insulin aspart  0-9 Units Subcutaneous TID WC   liver oil-zinc oxide   Topical BID   metoprolol succinate  12.5 mg Oral Daily   multivitamin with minerals  1 tablet Oral Daily   nutrition supplement (JUVEN)  1 packet Oral BID BM   oxymetazoline  1 spray Each Nare BID   sodium chloride flush  3 mL Intravenous Q12H   sodium chloride flush  3 mL Intravenous Q12H   Continuous Infusions:  sodium chloride     PRN Meds: sodium chloride, acetaminophen **OR** acetaminophen, bisacodyl, dextrose, diclofenac Sodium, hydrALAZINE, morphine injection, mouth rinse, oxyCODONE, polyethylene glycol, sodium chloride flush, traZODone   Vital Signs    Vitals:   06/17/22 1101 06/17/22 2000 06/17/22 2002 06/18/22 0357  BP:  106/65  107/69  Pulse:  100  95  Resp:  (!) '26 17 17  '$ Temp:    99 F (37.2 C)  TempSrc:    Oral  SpO2: 99% 98%  97%  Weight:    95.3 kg  Height:        Intake/Output Summary (Last 24 hours) at 06/18/2022 1040 Last data filed at 06/18/2022 0912 Gross per 24 hour  Intake 1756.07 ml  Output 3250 ml  Net -1493.93 ml      06/18/2022    3:57 AM 06/17/2022    5:28 AM 06/16/2022    5:32 AM  Last 3 Weights  Weight (lbs) 210 lb 1.6 oz 208 lb 11.2 oz 209 lb 14.1 oz  Weight (kg) 95.3 kg 94.666 kg 95.2 kg      Telemetry    Rate controlled atrial fibrillation - Personally  Reviewed  ECG    No new tracing today - Personally Reviewed  Physical Exam   GEN: Well nourished, well developed in no acute distress NECK: No JVD CARDIAC: irregularly irregular rhythm, normal S1 and S2, no rubs or gallops. No murmur. VASCULAR: radial cath site c/d/i RESPIRATORY:  Clear to auscultation without rales, wheezing or rhonchi  ABDOMEN: Soft, non-tender, non-distended MUSCULOSKELETAL:  Moves all 4 limbs independently SKIN: Warm and dry, no edema. L BKA, RLE with scabbing/ecchymoses NEUROLOGIC:  No focal neuro deficits noted. PSYCHIATRIC:  Normal affect    Labs    High Sensitivity Troponin:   Recent Labs  Lab 06/15/22 0209 06/15/22 0413 06/15/22 1135 06/15/22 1522  TROPONINIHS 1,420* 2,596* 6,096* 5,251*     Chemistry Recent Labs  Lab 06/15/22 0209 06/16/22 0330 06/16/22 1231 06/17/22 0726 06/18/22 0342  NA 136   < > 134* 137 133*  K 4.7   < > 4.7 4.2 3.9  CL 108   < > 106 111 105  CO2 16*   < > 19* 20* 20*  GLUCOSE 94   < > 184* 146* 244*  BUN 74*   < >  55* 48* 47*  CREATININE 1.70*   < > 1.32* 1.29* 1.14  CALCIUM 10.3   < > 9.2 8.6* 8.8*  PROT 7.4  --   --   --   --   ALBUMIN 4.0  --   --   --   --   AST 44*  --   --   --   --   ALT 23  --   --   --   --   ALKPHOS 69  --   --   --   --   BILITOT 0.7  --   --   --   --   GFRNONAA 44*   < > 60* >60 >60  ANIONGAP 12   < > '9 6 8   '$ < > = values in this interval not displayed.    Lipids  Recent Labs  Lab 06/16/22 0330  CHOL 79  TRIG 92  HDL 36*  LDLCALC 25  CHOLHDL 2.2    Hematology Recent Labs  Lab 06/16/22 0330 06/17/22 0726 06/18/22 0342  WBC 11.5* 8.7 10.8*  RBC 4.45 3.94* 3.93*  HGB 14.7 12.9* 12.9*  HCT 41.7 38.5* 37.5*  MCV 93.7 97.7 95.4  MCH 33.0 32.7 32.8  MCHC 35.3 33.5 34.4  RDW 13.4 13.7 13.9  PLT 158 132* 139*   Thyroid No results for input(s): "TSH", "FREET4" in the last 168 hours.  BNPNo results for input(s): "BNP", "PROBNP" in the last 168 hours.  DDimer No  results for input(s): "DDIMER" in the last 168 hours.   Radiology    CARDIAC CATHETERIZATION  Result Date: 06/17/2022 Conclusions: Severe multivessel coronary artery disease with severe calcification, as detailed below, including 50% mid LAD stenosis, 95% stenosis of dominant branch of large D1 and chronic total occlusion of lateral branch of D1, chronic total occlusion of LCx, and chronic total occlusion of ostial through mid RCA (including stent placed in 01/2022) with left-to-right collaterals. Normal left ventricular filling pressure (LVEDP 10 mmHg). Recommendations: Consider viability study with particular attention to the inferior wall.  If RCA territory is viable, cardiac surgery consultation for CABG should be considered.  If RCA territory is not viable (or the patient is not a candidate for CABG), medical therapy versus PCI to dominant branch of D1 (particularly if the patient reports recurrent angina) should be considered. Hold ticagrelor pending further workup and possible cardiac surgery consultation.  If the patient does not undergo CABG, I would favor 12 months of clopidogrel + DOAC therapy in the setting of new atrial fibrillation and NSTEMI. Aggressive secondary prevention of coronary artery disease. Nelva Bush, MD [facility]   Cardiac Studies   ECHO: 06/14/2022  1. Left ventricular ejection fraction, by estimation, is 40 to 45%. The  left ventricle has mildly decreased function. The left ventricle  demonstrates regional wall motion abnormalities (see scoring  diagram/findings for description). There is mild left ventricular hypertrophy.  Left ventricular diastolic function could not be evaluated. There is moderate hypokinesis of the left ventricular, basal-mid inferior wall and inferolateral wall.   2. Right ventricular systolic function is mildly reduced. The right  ventricular size is normal.   3. Left atrial size was severely dilated.   4. The mitral valve is abnormal.  Moderate mitral valve regurgitation.   5. The aortic valve is tricuspid. Aortic valve regurgitation is not  visualized.   6. Aortic dilatation noted. There is moderate dilatation of the ascending  aorta, measuring 47 mm. There is mild  dilatation of the aortic root,  measuring 42 mm.   7. The inferior vena cava is normal in size with greater than 50%  respiratory variability, suggesting right atrial pressure of 3 mmHg.   Comparison(s): Changes from prior study are noted. 02/11/2022 [facility]):  LVEF was 35-40%, global hypokinesis worse laterally.    LHC from 02/11/22 at [facility]:  Coronary Angiography Findings:  --LM: Minimal luminal irregularities  --LAD: Mild, diffuse, non-obstructive CAD, D1 80%, D2 75% and 100% of  inferior branch of D2 with L-L collaterals  --LCx: Ostial 100%, very faint and late L-L collaterals to likely OM  --RCA: Prox 99%, mid 80, heavily calcified, PDA 40%  --AV crossed: LVEDP: 18 mmHg   Intervention: Coronary angiogram showed culprit lesion in the likely the  RCA, however was heavily calcified. Additional Heparin given and loaded  with Brilinta. Inserted JR4 guide and wired across lesion with a BMW,  however had some difficulty wiring further down the vessel, therefore  inserted a Corsair (150cm) microcatheter and used a 3.0x28m compliant  balloon to trap the wire in place of the guide and advanced the  microcatheter down the vessel. Then exchanged the BMW for a Fielder XT and  was able to wire down distally, then advanced the Corsair down to the  distal vessel and exchanged the [facility]ro [facility] Hospital[facility] wire for a Wiggle wire, then  removed the microcatheter. Then inserted a 6Fr guideliner. Nex, inserted a  2.5x113mcompliant balloon and performed PTCA at high pressure. Next,  inserted a 3.0x1248mC and performed PTCA in the proximal lesion again due  to severe calcium. Next, decided to proceed with IVL with 3.0 Shockwave  and performed 8 treatments at 10 pulses each. Next,  inserted a 3.0x18m78mience DES and deployed at above nominal pressure. Repeat angiogram showed  adequate result given the heavy calcium. Next, inserted a 2.5x8mm 70mmpliant balloon and performed PTCA at the more mid lesion at very high  pressure. Next, inserted a 2.75x15mm 17mce DES, however was unable to  cross the lesion. Given the amount of contrast and >3.5Gy of radiation,  elected to stop. ACT level at therapeutic range during case.  Pre-procedural stenosis was 99% with TIMI II flow, and post-procedural  stenosis was 0% with TIMI III flow. There is still 50% lesion in the  mid-RCA.   Comments: Cath showed severe mvCAD, not including the LAD. Heavy  fluoroscopic calcium noted in the RCA, however given the severity of the  lesion elected to proceed with PCI. DAPT for minimum of 1 year, then ASA  indefinitely. Has already been loaded with Brilinta '180mg'$  x1 in the cath  lab at time of PCI. Needs aggressive medical therapy for CAD. AV crossed,  LVEDP was elevated.      If patient has recurrent angina, would go femoral access in the future  for better support, and needs plaque modification with rotational  atherectomy, however based on anatomy and heavy calcific vessels, and  couple of CTO vessels, would likely be better suited for CABG in future.   Patient Profile     66 y.o75male with PMH of multi-vessel CAD s/p PTCA to RCA 02/11/22, ischemic cardiomyopathy with LVEF 35-40%, HTN, CVA, type 2 DM, PVD, left BKA, OSA, BPH, who is admitted for NSTEMI,AKI, and newly diagnosed A fib.   Assessment & Plan    NSTEMI CAD with hx of PTCA to RCA 02/11/22  - presented with chest pain, SOB, found unresponsive during welfare check  - HS  trop peaked 6096 - EKG with new onset A fib, TWI appears new laterally  - Echo showed LVEF 40-45%, moderate hypokinesis of LV, basal-mid inferior wall and inferolateral wall, mildly reduced RV, severe LAE, moderate MR -Reviewed cath results with Dr. Saunders Revel and Dr.  Ali Lowe. After discussion, do not think that there are any good targets for PCI. Not a good candidate for CABG. Recommend medical management -on heparin. Will transition to [place] today -will change aspirin/ticagrelor to clopidogrel today, loading with 300 mg -on low dose beta blocker, cannot uptitrate due to low blood pressure  Ischemic cardiomyopathy  Type II diabetes - clinically euvolemic today  - Echo as above  - GDMT: only tolerating low dose metoprolol succinate currently. Acute kidney injury improving. Glucose also elevated. Will start back SGLT2i, was on 25 mg Jardiance at home. Will start with 10 mg dose - currently holding furosemide as euvolemic/presented with AKI - holding lisinopril due to AKI/low blood pressure - no current room for spironolactone  Atrial fibrillation - new diagnosis this admission - rate controlled on metoprolol, asymptomatic    - CHADS2VASc is 7 - changing heparin to DOAC  AKI: improving  OSA BPH PVD Hx of left BKA  Hx of CVA - per primary team   Dispo: he states that he was brought by EMS, has no clothes with him, has no ride. May need assistance from case manager/social worker.  [place] will sign off.   Medication Recommendations:   Continue current meds as follows: apixaban 5 mg BID, atorvastatin 40 mg daily, clopidogrel 75 mg daily, empagliflozin 10 mg daily, metoprolol succinate 12.5 mg daily  STOP prior aspirin, ticagrelor, furosemide, amlodipine  HOLD prior lisinopril at discharge Other recommendations (labs, testing, etc):  assess at follow up whether lisinopril and/or spironolactone can be started based on blood pressure and renal function Follow up as an outpatient:  We will arrange for outpatient cardiology follow up   Addended to add: needs PT/OT at discharge, not medically ready for cardiac rehab, can be reassessed as an outpatient  For questions or updates, please contact [facility] Please consult  [email or web address] for contact info under     Signed, Buford Dresser, MD  06/18/2022, 10:40 AM

## 2022-06-18 NOTE — Plan of Care (Signed)
Problem: Education: Goal: Knowledge of General Education information will improve Description: Including pain rating scale, medication(s)/side effects and non-pharmacologic comfort measures 06/18/2022 1853 by Rolm Baptise, RN Outcome: Adequate for Discharge 06/18/2022 1419 by Rolm Baptise, RN Outcome: Adequate for Discharge   Problem: Health Behavior/Discharge Planning: Goal: Ability to manage health-related needs will improve 06/18/2022 1853 by Rolm Baptise, RN Outcome: Adequate for Discharge 06/18/2022 1419 by Rolm Baptise, RN Outcome: Adequate for Discharge   Problem: Clinical Measurements: Goal: Ability to maintain clinical measurements within normal limits will improve 06/18/2022 1853 by Rolm Baptise, RN Outcome: Adequate for Discharge 06/18/2022 1419 by Rolm Baptise, RN Outcome: Adequate for Discharge Goal: Will remain free from infection 06/18/2022 1853 by Rolm Baptise, RN Outcome: Adequate for Discharge 06/18/2022 1419 by Rolm Baptise, RN Outcome: Adequate for Discharge Goal: Diagnostic test results will improve 06/18/2022 1853 by Rolm Baptise, RN Outcome: Adequate for Discharge 06/18/2022 1419 by Rolm Baptise, RN Outcome: Adequate for Discharge Goal: Respiratory complications will improve 06/18/2022 1853 by Rolm Baptise, RN Outcome: Adequate for Discharge 06/18/2022 1419 by Rolm Baptise, RN Outcome: Adequate for Discharge Goal: Cardiovascular complication will be avoided 06/18/2022 1853 by Rolm Baptise, RN Outcome: Adequate for Discharge 06/18/2022 1419 by Rolm Baptise, RN Outcome: Adequate for Discharge   Problem: Activity: Goal: Risk for activity intolerance will decrease 06/18/2022 1853 by Rolm Baptise, RN Outcome: Adequate for Discharge 06/18/2022 1419 by Rolm Baptise, RN Outcome: Adequate for Discharge   Problem: Nutrition: Goal: Adequate nutrition will be maintained 06/18/2022 1853 by Rolm Baptise, RN Outcome:  Adequate for Discharge 06/18/2022 1419 by Rolm Baptise, RN Outcome: Adequate for Discharge   Problem: Coping: Goal: Level of anxiety will decrease 06/18/2022 1853 by Rolm Baptise, RN Outcome: Adequate for Discharge 06/18/2022 1419 by Rolm Baptise, RN Outcome: Adequate for Discharge   Problem: Elimination: Goal: Will not experience complications related to bowel motility 06/18/2022 1853 by Rolm Baptise, RN Outcome: Adequate for Discharge 06/18/2022 1419 by Rolm Baptise, RN Outcome: Adequate for Discharge Goal: Will not experience complications related to urinary retention 06/18/2022 1853 by Rolm Baptise, RN Outcome: Adequate for Discharge 06/18/2022 1419 by Rolm Baptise, RN Outcome: Adequate for Discharge   Problem: Pain Managment: Goal: General experience of comfort will improve 06/18/2022 1853 by Rolm Baptise, RN Outcome: Adequate for Discharge 06/18/2022 1419 by Rolm Baptise, RN Outcome: Adequate for Discharge   Problem: Safety: Goal: Ability to remain free from injury will improve 06/18/2022 1853 by Rolm Baptise, RN Outcome: Adequate for Discharge 06/18/2022 1419 by Rolm Baptise, RN Outcome: Adequate for Discharge   Problem: Skin Integrity: Goal: Risk for impaired skin integrity will decrease 06/18/2022 1853 by Rolm Baptise, RN Outcome: Adequate for Discharge 06/18/2022 1419 by Rolm Baptise, RN Outcome: Adequate for Discharge   Problem: Education: Goal: Understanding of CV disease, CV risk reduction, and recovery process will improve 06/18/2022 1853 by Rolm Baptise, RN Outcome: Adequate for Discharge 06/18/2022 1419 by Rolm Baptise, RN Outcome: Adequate for Discharge Goal: Individualized Educational Video(s) 06/18/2022 1853 by Rolm Baptise, RN Outcome: Adequate for Discharge 06/18/2022 1419 by Rolm Baptise, RN Outcome: Adequate for Discharge   Problem: Activity: Goal: Ability to return to baseline activity level will  improve 06/18/2022 1853 by Rolm Baptise, RN Outcome: Adequate for Discharge 06/18/2022 1419 by Rolm Baptise, RN Outcome: Adequate for Discharge  Problem: Cardiovascular: Goal: Ability to achieve and maintain adequate cardiovascular perfusion will improve 06/18/2022 1853 by Rolm Baptise, RN Outcome: Adequate for Discharge 06/18/2022 1419 by Rolm Baptise, RN Outcome: Adequate for Discharge Goal: Vascular access site(s) Level 0-1 will be maintained 06/18/2022 1853 by Rolm Baptise, RN Outcome: Adequate for Discharge 06/18/2022 1419 by Rolm Baptise, RN Outcome: Adequate for Discharge   Problem: Health Behavior/Discharge Planning: Goal: Ability to safely manage health-related needs after discharge will improve 06/18/2022 1853 by Rolm Baptise, RN Outcome: Adequate for Discharge 06/18/2022 1419 by Rolm Baptise, RN Outcome: Adequate for Discharge

## 2022-06-19 LAB — LIPOPROTEIN A (LPA): Lipoprotein (a): 27.9 nmol/L (ref <75.0)

## 2022-06-21 ENCOUNTER — Other Ambulatory Visit (HOSPITAL_COMMUNITY): Payer: Self-pay

## 2022-06-25 ENCOUNTER — Emergency Department (HOSPITAL_COMMUNITY): Payer: Medicare Other

## 2022-06-25 ENCOUNTER — Other Ambulatory Visit (HOSPITAL_COMMUNITY): Payer: Self-pay

## 2022-06-25 ENCOUNTER — Inpatient Hospital Stay (HOSPITAL_COMMUNITY)
Admission: EM | Admit: 2022-06-25 | Discharge: 2022-06-30 | DRG: 871 | Disposition: A | Discharge status: Home Health | Payer: Medicare Other | Attending: Internal Medicine | Admitting: Internal Medicine

## 2022-06-25 ENCOUNTER — Encounter (HOSPITAL_COMMUNITY): Payer: Self-pay

## 2022-06-25 DIAGNOSIS — E1151 Type 2 diabetes mellitus with diabetic peripheral angiopathy without gangrene: Secondary | ICD-10-CM | POA: Diagnosis present

## 2022-06-25 DIAGNOSIS — A419 Sepsis, unspecified organism: Secondary | ICD-10-CM | POA: Diagnosis not present

## 2022-06-25 DIAGNOSIS — I480 Paroxysmal atrial fibrillation: Secondary | ICD-10-CM | POA: Diagnosis present

## 2022-06-25 DIAGNOSIS — Z7901 Long term (current) use of anticoagulants: Secondary | ICD-10-CM

## 2022-06-25 DIAGNOSIS — I4891 Unspecified atrial fibrillation: Secondary | ICD-10-CM | POA: Diagnosis present

## 2022-06-25 DIAGNOSIS — Z8673 Personal history of transient ischemic attack (TIA), and cerebral infarction without residual deficits: Secondary | ICD-10-CM

## 2022-06-25 DIAGNOSIS — E782 Mixed hyperlipidemia: Secondary | ICD-10-CM | POA: Diagnosis present

## 2022-06-25 DIAGNOSIS — L89152 Pressure ulcer of sacral region, stage 2: Secondary | ICD-10-CM | POA: Diagnosis present

## 2022-06-25 DIAGNOSIS — G4733 Obstructive sleep apnea (adult) (pediatric): Secondary | ICD-10-CM | POA: Diagnosis present

## 2022-06-25 DIAGNOSIS — N179 Acute kidney failure, unspecified: Secondary | ICD-10-CM | POA: Diagnosis not present

## 2022-06-25 DIAGNOSIS — E869 Volume depletion, unspecified: Secondary | ICD-10-CM | POA: Diagnosis present

## 2022-06-25 DIAGNOSIS — E11649 Type 2 diabetes mellitus with hypoglycemia without coma: Secondary | ICD-10-CM | POA: Diagnosis not present

## 2022-06-25 DIAGNOSIS — Z794 Long term (current) use of insulin: Secondary | ICD-10-CM

## 2022-06-25 DIAGNOSIS — E1142 Type 2 diabetes mellitus with diabetic polyneuropathy: Secondary | ICD-10-CM | POA: Diagnosis present

## 2022-06-25 DIAGNOSIS — Z955 Presence of coronary angioplasty implant and graft: Secondary | ICD-10-CM

## 2022-06-25 DIAGNOSIS — I255 Ischemic cardiomyopathy: Secondary | ICD-10-CM | POA: Diagnosis present

## 2022-06-25 DIAGNOSIS — I2510 Atherosclerotic heart disease of native coronary artery without angina pectoris: Secondary | ICD-10-CM

## 2022-06-25 DIAGNOSIS — G9341 Metabolic encephalopathy: Secondary | ICD-10-CM | POA: Diagnosis present

## 2022-06-25 DIAGNOSIS — I25119 Atherosclerotic heart disease of native coronary artery with unspecified angina pectoris: Secondary | ICD-10-CM | POA: Diagnosis present

## 2022-06-25 DIAGNOSIS — R6520 Severe sepsis without septic shock: Secondary | ICD-10-CM | POA: Diagnosis present

## 2022-06-25 DIAGNOSIS — I25110 Atherosclerotic heart disease of native coronary artery with unstable angina pectoris: Secondary | ICD-10-CM | POA: Diagnosis not present

## 2022-06-25 DIAGNOSIS — Z7902 Long term (current) use of antithrombotics/antiplatelets: Secondary | ICD-10-CM

## 2022-06-25 DIAGNOSIS — I5023 Acute on chronic systolic (congestive) heart failure: Secondary | ICD-10-CM | POA: Diagnosis present

## 2022-06-25 DIAGNOSIS — E162 Hypoglycemia, unspecified: Secondary | ICD-10-CM

## 2022-06-25 DIAGNOSIS — I252 Old myocardial infarction: Secondary | ICD-10-CM

## 2022-06-25 DIAGNOSIS — E785 Hyperlipidemia, unspecified: Secondary | ICD-10-CM | POA: Diagnosis present

## 2022-06-25 DIAGNOSIS — I110 Hypertensive heart disease with heart failure: Secondary | ICD-10-CM | POA: Diagnosis present

## 2022-06-25 DIAGNOSIS — Z89512 Acquired absence of left leg below knee: Secondary | ICD-10-CM

## 2022-06-25 DIAGNOSIS — E1169 Type 2 diabetes mellitus with other specified complication: Secondary | ICD-10-CM | POA: Diagnosis present

## 2022-06-25 DIAGNOSIS — E876 Hypokalemia: Secondary | ICD-10-CM | POA: Diagnosis not present

## 2022-06-25 DIAGNOSIS — Z7984 Long term (current) use of oral hypoglycemic drugs: Secondary | ICD-10-CM

## 2022-06-25 DIAGNOSIS — Z79899 Other long term (current) drug therapy: Secondary | ICD-10-CM

## 2022-06-25 DIAGNOSIS — R8281 Pyuria: Secondary | ICD-10-CM | POA: Diagnosis present

## 2022-06-25 DIAGNOSIS — Z8249 Family history of ischemic heart disease and other diseases of the circulatory system: Secondary | ICD-10-CM

## 2022-06-25 DIAGNOSIS — I7121 Aneurysm of the ascending aorta, without rupture: Secondary | ICD-10-CM | POA: Diagnosis present

## 2022-06-25 DIAGNOSIS — R319 Hematuria, unspecified: Secondary | ICD-10-CM

## 2022-06-25 DIAGNOSIS — Y95 Nosocomial condition: Secondary | ICD-10-CM | POA: Diagnosis present

## 2022-06-25 DIAGNOSIS — Z833 Family history of diabetes mellitus: Secondary | ICD-10-CM

## 2022-06-25 DIAGNOSIS — I5022 Chronic systolic (congestive) heart failure: Secondary | ICD-10-CM | POA: Diagnosis present

## 2022-06-25 DIAGNOSIS — R310 Gross hematuria: Secondary | ICD-10-CM | POA: Diagnosis not present

## 2022-06-25 DIAGNOSIS — I10 Essential (primary) hypertension: Secondary | ICD-10-CM | POA: Diagnosis present

## 2022-06-25 DIAGNOSIS — D649 Anemia, unspecified: Secondary | ICD-10-CM | POA: Diagnosis present

## 2022-06-25 DIAGNOSIS — J189 Pneumonia, unspecified organism: Secondary | ICD-10-CM | POA: Diagnosis present

## 2022-06-25 DIAGNOSIS — R4182 Altered mental status, unspecified: Principal | ICD-10-CM

## 2022-06-25 DIAGNOSIS — Z66 Do not resuscitate: Secondary | ICD-10-CM | POA: Diagnosis present

## 2022-06-25 HISTORY — DX: Atherosclerotic heart disease of native coronary artery without angina pectoris: I25.10

## 2022-06-25 HISTORY — DX: Hematuria, unspecified: R31.9

## 2022-06-25 LAB — COMPREHENSIVE METABOLIC PANEL
ALT: 32 U/L (ref 0–44)
AST: 67 U/L — ABNORMAL HIGH (ref 15–41)
Albumin: 2.7 g/dL — ABNORMAL LOW (ref 3.5–5.0)
Alkaline Phosphatase: 61 U/L (ref 38–126)
Anion gap: 14 (ref 5–15)
BUN: 47 mg/dL — ABNORMAL HIGH (ref 8–23)
CO2: 16 mmol/L — ABNORMAL LOW (ref 22–32)
Calcium: 8.7 mg/dL — ABNORMAL LOW (ref 8.9–10.3)
Chloride: 109 mmol/L (ref 98–111)
Creatinine, Ser: 1.45 mg/dL — ABNORMAL HIGH (ref 0.61–1.24)
GFR, Estimated: 53 mL/min — ABNORMAL LOW (ref >60)
Glucose, Bld: 86 mg/dL (ref 70–99)
Potassium: 5 mmol/L (ref 3.5–5.1)
Sodium: 139 mmol/L (ref 135–145)
Total Bilirubin: 1 mg/dL (ref 0.3–1.2)
Total Protein: 6.1 g/dL — ABNORMAL LOW (ref 6.5–8.1)

## 2022-06-25 LAB — URINALYSIS, ROUTINE W REFLEX MICROSCOPIC
Glucose, UA: 100 mg/dL — AB
Ketones, ur: 20 mg/dL — AB
Nitrite: NEGATIVE
Protein, ur: >300 mg/dL — AB
Specific Gravity, Urine: 1.015 (ref 1.005–1.030)
pH: 6 (ref 5.0–8.0)

## 2022-06-25 LAB — CBC WITH DIFFERENTIAL/PLATELET
Abs Immature Granulocytes: 0.14 10*3/uL — ABNORMAL HIGH (ref 0.00–0.07)
Basophils Absolute: 0 10*3/uL (ref 0.0–0.1)
Basophils Relative: 0 %
Eosinophils Absolute: 0 10*3/uL (ref 0.0–0.5)
Eosinophils Relative: 0 %
HCT: 42.3 % (ref 39.0–52.0)
Hemoglobin: 14.1 g/dL (ref 13.0–17.0)
Immature Granulocytes: 1 %
Lymphocytes Relative: 4 %
Lymphs Abs: 0.6 10*3/uL — ABNORMAL LOW (ref 0.7–4.0)
MCH: 32.9 pg (ref 26.0–34.0)
MCHC: 33.3 g/dL (ref 30.0–36.0)
MCV: 98.6 fL (ref 80.0–100.0)
Monocytes Absolute: 0.6 10*3/uL (ref 0.1–1.0)
Monocytes Relative: 4 %
Neutro Abs: 15.1 10*3/uL — ABNORMAL HIGH (ref 1.7–7.7)
Neutrophils Relative %: 91 %
Platelets: 267 10*3/uL (ref 150–400)
RBC: 4.29 MIL/uL (ref 4.22–5.81)
RDW: 14.6 % (ref 11.5–15.5)
WBC: 16.5 10*3/uL — ABNORMAL HIGH (ref 4.0–10.5)
nRBC: 0 % (ref 0.0–0.2)

## 2022-06-25 LAB — CBG MONITORING, ED
Glucose-Capillary: 37 mg/dL — CL (ref 70–99)
Glucose-Capillary: 78 mg/dL (ref 70–99)
Glucose-Capillary: 83 mg/dL (ref 70–99)
Glucose-Capillary: 91 mg/dL (ref 70–99)
Glucose-Capillary: 89 mg/dL (ref 70–99)
Glucose-Capillary: 239 mg/dL — ABNORMAL HIGH (ref 70–99)
Glucose-Capillary: 254 mg/dL — ABNORMAL HIGH (ref 70–99)

## 2022-06-25 LAB — I-STAT VENOUS BLOOD GAS, ED
Acid-base deficit: 11 mmol/L — ABNORMAL HIGH (ref 0.0–2.0)
Acid-base deficit: 8 mmol/L — ABNORMAL HIGH (ref 0.0–2.0)
Bicarbonate: 11.9 mmol/L — ABNORMAL LOW (ref 20.0–28.0)
Bicarbonate: 15.9 mmol/L — ABNORMAL LOW (ref 20.0–28.0)
Calcium, Ion: 1.19 mmol/L (ref 1.15–1.40)
Calcium, Ion: 1.02 mmol/L — ABNORMAL LOW (ref 1.15–1.40)
HCT: 43 % (ref 39.0–52.0)
HCT: 37 % — ABNORMAL LOW (ref 39.0–52.0)
Hemoglobin: 14.6 g/dL (ref 13.0–17.0)
Hemoglobin: 12.6 g/dL — ABNORMAL LOW (ref 13.0–17.0)
O2 Saturation: 70 %
O2 Saturation: 36 %
Patient temperature: 97.6
Potassium: 4.5 mmol/L (ref 3.5–5.1)
Potassium: 5.2 mmol/L — ABNORMAL HIGH (ref 3.5–5.1)
Sodium: 136 mmol/L (ref 135–145)
Sodium: 135 mmol/L (ref 135–145)
TCO2: 13 mmol/L — ABNORMAL LOW (ref 22–32)
TCO2: 17 mmol/L — ABNORMAL LOW (ref 22–32)
pCO2, Ven: 21 mmHg — ABNORMAL LOW (ref 44–60)
pCO2, Ven: 28.9 mmHg — ABNORMAL LOW (ref 44–60)
pH, Ven: 7.36 (ref 7.25–7.43)
pH, Ven: 7.348 (ref 7.25–7.43)
pO2, Ven: 35 mmHg (ref 32–45)
pO2, Ven: 22 mmHg — CL (ref 32–45)

## 2022-06-25 LAB — TROPONIN I (HIGH SENSITIVITY): Troponin I (High Sensitivity): 7765 ng/L (ref <18)

## 2022-06-25 LAB — ETHANOL: Alcohol, Ethyl (B): <10 mg/dL (ref <10)

## 2022-06-25 LAB — URINALYSIS, MICROSCOPIC (REFLEX): RBC / HPF: >50 RBC/hpf (ref 0–5)

## 2022-06-25 LAB — RAPID URINE DRUG SCREEN, HOSP PERFORMED
Amphetamines: NOT DETECTED
Barbiturates: NOT DETECTED
Benzodiazepines: NOT DETECTED
Cocaine: NOT DETECTED
Opiates: NOT DETECTED
Tetrahydrocannabinol: NOT DETECTED

## 2022-06-25 LAB — PROTIME-INR
INR: 1.2 (ref 0.8–1.2)
Prothrombin Time: 14.6 seconds (ref 11.4–15.2)

## 2022-06-25 LAB — STREP PNEUMONIAE URINARY ANTIGEN: Strep Pneumo Urinary Antigen: NEGATIVE

## 2022-06-25 LAB — CK: Total CK: 466 U/L — ABNORMAL HIGH (ref 49–397)

## 2022-06-25 LAB — LACTIC ACID, PLASMA
Lactic Acid, Venous: 2.4 mmol/L (ref 0.5–1.9)
Lactic Acid, Venous: 2.6 mmol/L (ref 0.5–1.9)

## 2022-06-25 LAB — PROCALCITONIN: Procalcitonin: 2.26 ng/mL

## 2022-06-25 LAB — POC OCCULT BLOOD, ED: Fecal Occult Bld: POSITIVE — AB

## 2022-06-25 MED ORDER — SODIUM CHLORIDE 0.9 % IV SOLN: 2.0000 g | Freq: Once | INTRAVENOUS | Status: DC

## 2022-06-25 MED ORDER — ACETAMINOPHEN 650 MG RE SUPP: 650.0000 mg | Freq: Four times a day (QID) | RECTAL | Status: DC | PRN

## 2022-06-25 MED ORDER — DEXTROSE 50 % IV SOLN
INTRAVENOUS | Status: AC
  Filled 2022-06-25: qty 50

## 2022-06-25 MED ORDER — METRONIDAZOLE 500 MG/100ML IV SOLN
500.0000 mg | Freq: Once | INTRAVENOUS | Status: AC
  Administered 2022-06-25: 500 mg via INTRAVENOUS
  Filled 2022-06-25: qty 100

## 2022-06-25 MED ORDER — VANCOMYCIN HCL 2000 MG/400ML IV SOLN
2000.0000 mg | INTRAVENOUS | Status: DC
  Administered 2022-06-26: 2000 mg via INTRAVENOUS
  Filled 2022-06-25 (×3): qty 400

## 2022-06-25 MED ORDER — VANCOMYCIN HCL IN DEXTROSE 1-5 GM/200ML-% IV SOLN: 1000.0000 mg | Freq: Once | INTRAVENOUS | Status: DC

## 2022-06-25 MED ORDER — SODIUM CHLORIDE 0.9 % IV SOLN
2.0000 g | Freq: Three times a day (TID) | INTRAVENOUS | Status: DC
  Administered 2022-06-25: 2 g via INTRAVENOUS
  Filled 2022-06-25: qty 12.5

## 2022-06-25 MED ORDER — ATORVASTATIN CALCIUM 80 MG PO TABS
80.0000 mg | ORAL_TABLET | Freq: Every day | ORAL | Status: DC
  Administered 2022-06-25 – 2022-06-29 (×5): 80 mg via ORAL
  Filled 2022-06-25 (×4): qty 1
  Filled 2022-06-25: qty 2

## 2022-06-25 MED ORDER — LACTATED RINGERS IV BOLUS (SEPSIS)
1000.0000 mL | Freq: Once | INTRAVENOUS | Status: AC
  Administered 2022-06-25: 1000 mL via INTRAVENOUS

## 2022-06-25 MED ORDER — LACTATED RINGERS IV SOLN: INTRAVENOUS | Status: DC

## 2022-06-25 MED ORDER — VANCOMYCIN HCL 2000 MG/400ML IV SOLN
2000.0000 mg | Freq: Once | INTRAVENOUS | Status: AC
  Administered 2022-06-25: 2000 mg via INTRAVENOUS
  Filled 2022-06-25: qty 400

## 2022-06-25 MED ORDER — DEXTROSE-NACL 10-0.45 % IV SOLN
INTRAVENOUS | Status: DC
Start: 2022-06-25 — End: 2022-06-26
  Filled 2022-06-25 (×8): qty 1000

## 2022-06-25 MED ORDER — PANTOPRAZOLE SODIUM 40 MG PO TBEC
40.0000 mg | DELAYED_RELEASE_TABLET | Freq: Two times a day (BID) | ORAL | Status: DC
  Administered 2022-06-25 – 2022-06-30 (×11): 40 mg via ORAL
  Filled 2022-06-25 (×11): qty 1

## 2022-06-25 MED ORDER — ACETAMINOPHEN 325 MG PO TABS
650.0000 mg | ORAL_TABLET | Freq: Four times a day (QID) | ORAL | Status: DC | PRN
  Administered 2022-06-26: 650 mg via ORAL
  Filled 2022-06-25 (×2): qty 2

## 2022-06-25 MED ORDER — SODIUM CHLORIDE 0.9 % IV SOLN
2.0000 g | Freq: Three times a day (TID) | INTRAVENOUS | Status: DC
  Administered 2022-06-25 – 2022-06-30 (×14): 2 g via INTRAVENOUS
  Filled 2022-06-25 (×14): qty 12.5

## 2022-06-25 MED ORDER — LACTATED RINGERS IV BOLUS
500.0000 mL | Freq: Once | INTRAVENOUS | Status: AC
  Administered 2022-06-25: 500 mL via INTRAVENOUS

## 2022-06-25 MED ORDER — DEXTROSE 50 % IV SOLN
1.0000 | Freq: Once | INTRAVENOUS | Status: AC
  Administered 2022-06-25: 50 mL via INTRAVENOUS

## 2022-06-25 MED ORDER — PANTOPRAZOLE SODIUM 40 MG IV SOLR: 40.0000 mg | Freq: Two times a day (BID) | INTRAVENOUS | Status: DC

## 2022-06-25 MED ORDER — GABAPENTIN 300 MG PO CAPS
300.0000 mg | ORAL_CAPSULE | Freq: Every day | ORAL | Status: DC
  Administered 2022-06-25 – 2022-06-29 (×5): 300 mg via ORAL
  Filled 2022-06-25 (×5): qty 1

## 2022-06-25 NOTE — ED Notes (Signed)
Pt has a hx of poor cardiac EF. Fluid bolus paused per Dr. Shelby Mattocks orders.

## 2022-06-25 NOTE — Assessment & Plan Note (Signed)
-   Wound care consult 

## 2022-06-25 NOTE — Assessment & Plan Note (Signed)
Frank blood in urine and +fecal occult He has been taking eliquis/brilinta and plavix  He endorses one episode of dark stool Discussed with GI and no need to consult in setting of clinical picture . Consult if hgb drops Cbc q6 hours x 2 Start protonix '40mg'$  PO BID Okay for diet

## 2022-06-25 NOTE — Assessment & Plan Note (Signed)
Creatinine typically normal and mildly bumped up to 1.45 today Prerenal in setting of hypotension and likely dehydration since found down for unknown period of time, ATN with sepsis. CK pending  UA pending Strict I/O Hold nephrotoxic meds Maintain MAP >65 Continue IVF Trend

## 2022-06-25 NOTE — Assessment & Plan Note (Addendum)
Likely secondary to hypoglycemia. Resolved with D50 and alert and oriented x 4.  Also has sepsis with possible pneumonia. Found down ? Aspiration as well.  Continue broad spectrum abx until urine back, PCT pending CT head with no acute finding -troponin elevated, similar to NSTEMI and cardiology has been consulted  -check CK

## 2022-06-25 NOTE — ED Notes (Signed)
Dr. Wolfe at bedside.

## 2022-06-25 NOTE — Progress Notes (Signed)
Pharmacy Antibiotic Note  Peter Yang is a 66 y.o. male for which pharmacy has been consulted for cefepime and vancomycin dosing for sepsis.  Patient with a history of BPH, DM, HTN, PVD s/p L BKA, CAD s/p stent, HLD, hx of TIA, OSA. Patient presenting with AMS.  SCr 1.45 - up from 1.14 on 8/29 WBC 16.5; LA 2.6; T 97.8; HR 106; RR 21>26  Plan: Metronidazole per MD Cefepime 2g q8hr Vancomycin 2000 mg q24hr (eAUC 524.6) unless change in renal function Trend WBC, Fever, Renal function, & Clinical course F/u cultures, clinical course, WBC, fever De-escalate when able Levels at steady state  Height: '6\' 4"'$  (193 cm) Weight: 94.3 kg (208 lb) IBW/kg (Calculated) : 86.8  Temp (24hrs), Avg:97.8 F (36.6 C), Min:97.8 F (36.6 C), Max:97.8 F (36.6 C)  Recent Labs  Lab 06/25/22 1230  WBC 16.5*  LATICACIDVEN 2.4*    Estimated Creatinine Clearance: 79.3 mL/min (by C-G formula based on SCr of 1.14 mg/dL).    No Known Allergies  Antimicrobials this admission: cefepime 9/5 >>  flagyl 9/5 >>  vancomycin 9/5 >>   Microbiology results: Pending  Thank you for allowing pharmacy to be a part of this patient's care.  Lorelei Pont, PharmD, BCPS 06/25/2022 2:21 PM ED Clinical Pharmacist -  [phone]

## 2022-06-25 NOTE — Assessment & Plan Note (Signed)
Elevated WBC, tachycardia, elevated lactic acid and CXR finding of new infiltrate seen in right parahilar region and right lower lung fields suggesting pneumonia -continue broad spectrum abx -check PCT -BC pending -urinary antigens  -received '30mg'$ /kg IVF resuscitation and now on D101/2NS -trend lactic acid  -albuterol prn  -trend CBC

## 2022-06-25 NOTE — Assessment & Plan Note (Signed)
Hypotensive Hold jardiance, lisinopril and metoprolol

## 2022-06-25 NOTE — Assessment & Plan Note (Signed)
Continue high dose lipitor

## 2022-06-25 NOTE — ED Notes (Signed)
Pt provided ice chips and water. Attempted 2nd IV. Unable to obtained. Dr. Jeanell Sparrow aware.

## 2022-06-25 NOTE — Assessment & Plan Note (Signed)
In NSR, mildly tachycardic in setting of sepsis and hypotension Holding beta blocker with hypotension Holding eliquis tonight with +fecal occult and frank hematuria

## 2022-06-25 NOTE — ED Notes (Signed)
Called and confirmed with lab to add on ck, procalcitonin and UA, Urine culture

## 2022-06-25 NOTE — Sepsis Progress Note (Signed)
Elink tracking the code Sepsis.

## 2022-06-25 NOTE — Assessment & Plan Note (Signed)
Dry on exam Received aggressive IVF in ED, now on D10-1/2NS and blood pressure normotensive Echo 06/17/2022: EF of 40-45% with mildly decreased LVF. Diastolic indeterminate. Right systolic function mildly reduced.  Strict I/O Holding ACE-I, toprol, jardiance with hypotension

## 2022-06-25 NOTE — H&P (Signed)
History and Physical    Patient: Peter Yang WHQ:[id number] DOB: Oct 31, 1955 DOA: 06/25/2022 DOS: the patient was seen and examined on 06/25/2022 PCP: Clovia Cuff, MD  Patient coming from: Home - lives alone. Uses prosthesis    Chief Complaint: AMS  HPI: Peter Yang is a 66 y.o. male with medical history significant of  BPH; DM; HTN; PVD s/p L BKA; CAD s/p stent; HLD, hx of TIA, OSA who was recently discharged on 8/29 from [facility] for a NSTEMI and hypoglycemia. He is s/p LHC on 8/28 with medical management recommended. Also had new onset atrial fibrillation and was discharged on eliquis.   His friend had been trying to call him for a few days and they went out to the house and it was a mess. There was stool and urine everywhere. When he arrived his blood sugar was 37, after D50 he is awake and talking clearly. He can not remember anything past last Saturday. He does state he has been taking 40 units of his tresiba. He doesn't think he fell. All he remembers is waking up here in the ED.    Denies any fever/chills, vision changes/headaches, chest pain or palpitations, shortness of breath or cough, abdominal pain, N/V/D, dysuria or leg swelling. He states he has had one episode of dark stool. He does not use NSAIDs, no caffiene. States he had a cscope in 2021. FH of colon cancer in his father.    Denies any smoking or drinking   ER Course:  vitals: afebrile, bp: 96/66, HR: 108, RR: 15, oxygen: 93%Ra Pertinent labs: blood sugar: 37, wbc: 16.5, lactic acid: 2.4, fecal occult positive,  CT head: no evidence of acute finding.  CXR: new infiltrate seen in right parahilar region and right lower lung fields suggesting pneumonia  In ED: sepsis protocol, bolused per sepsis protocol, D101/2NS started, broad spectrum abx, cardiology consulted. TRH asked to admit.    Review of Systems: As mentioned in the history of present illness. All other systems reviewed and are negative. Past Medical History:   Diagnosis Date   BPH with obstruction/lower urinary tract symptoms 02/02/2018   Controlled type 2 diabetes mellitus with hyperglycemia, with long-term current use of insulin ([place]) 06/28/2021   Diabetic polyneuropathy associated with type 2 diabetes mellitus ([place]) 07/28/2021   Essential hypertension 01/25/2016   Hypertriglyceridemia    Mixed diabetic hyperlipidemia associated with type 2 diabetes mellitus ([place]) 07/28/2021   OSA on CPAP    TIA (transient ischemic attack) 2012   Past Surgical History:  Procedure Laterality Date   BELOW KNEE LEG AMPUTATION Left 2022   CORONARY ANGIOPLASTY WITH STENT PLACEMENT  01/2022   DES RCA   LEFT HEART CATH AND CORONARY ANGIOGRAPHY N/A 06/17/2022   Procedure: LEFT HEART CATH AND CORONARY ANGIOGRAPHY;  Surgeon: Nelva Bush, MD;  Location: [facility];  Service: Cardiovascular;  Laterality: N/A;   ROTATOR CUFF REPAIR Left    Social History:  reports that he has never smoked. He has never used smokeless tobacco. He reports that he does not drink alcohol and does not use drugs.  No Known Allergies  Family History  Problem Relation Age of Onset   Diabetes Mother    Hypertension Mother    Heart disease Mother    Cancer Father    Heart attack Sister    Cancer Sister    Diabetes Brother     Prior to Admission medications   Medication Sig Start Date End Date Taking? Authorizing Provider  acetaminophen (TYLENOL)  325 MG tablet Take 2 tablets (650 mg total) by mouth every 6 (six) hours as needed for mild pain (or Fever >/= 101). 07/30/21  Yes Sheikh, Omair Latif, DO  apixaban (ELIQUIS) 5 MG TABS tablet Take 1 tablet (5 mg total) by mouth 2 (two) times daily. 06/18/22 07/21/22 Yes Donne Hazel, MD  atorvastatin (LIPITOR) 80 MG tablet Take 80 mg by mouth daily. 03/17/21  Yes [provider]  Insulin Degludec (TRESIBA FLEXTOUCH [place]) Inject 40 Units into the skin at bedtime.   Yes [provider]  Magnesium 500 MG TABS Take 500  mg by mouth at bedtime.   Yes [provider]  metFORMIN (GLUCOPHAGE) 500 MG tablet Take 1,000 mg by mouth 2 (two) times daily with a meal.   Yes [provider]  omeprazole (PRILOSEC OTC) 20 MG tablet Take 20 mg by mouth daily.   Yes [provider]  potassium chloride SA (KLOR-CON M) 20 MEQ tablet Take 20 mEq by mouth daily.   Yes [provider]  ticagrelor (BRILINTA) 90 MG TABS tablet Take 90 mg by mouth 2 (two) times daily.   Yes [provider]  clopidogrel (PLAVIX) 75 MG tablet Take 1 tablet (75 mg total) by mouth daily. 06/19/22 07/21/22  Donne Hazel, MD  diclofenac Sodium (VOLTAREN) 1 % GEL Apply 2 g topically 2 (two) times daily as needed (pain). 07/20/21   [provider]  empagliflozin (JARDIANCE) 10 MG TABS tablet Take 1 tablet (10 mg total) by mouth daily. 06/19/22 07/21/22  Donne Hazel, MD  finasteride (PROSCAR) 5 MG tablet Take 5 mg by mouth daily. 03/17/21   [provider]  gabapentin (NEURONTIN) 300 MG capsule Take 300 mg by mouth. 07/20/21   [provider]  metoprolol succinate (TOPROL-XL) 25 MG 24 hr tablet Take 0.5 tablets (12.5 mg total) by mouth daily. 06/19/22 07/19/22  Donne Hazel, MD    Physical Exam: Vitals:   06/25/22 1730 06/25/22 1800 06/25/22 1830 06/25/22 1900  BP: 96/70 121/79 123/84 117/76  Pulse: (!) 103 (!) 108 (!) 110 (!) 104  Resp: 19 (!) 26 (!) 21 (!) 26  Temp:    97.8 F (36.6 C)  TempSrc:    Oral  SpO2: 98% 97% 98% 99%  Weight:      Height:       General:  Appears calm and comfortable and is in NAD. Disheveled.  Eyes:  PERRL, EOMI, normal lids, iris ENT:  grossly normal hearing, lips & tongue, mmm; appropriate dentition Neck:  no LAD, masses or thyromegaly; no carotid bruits Cardiovascular:  RRR, no m/r/g. No LE edema.  Respiratory:   CTA bilaterally with no wheezes/rales/rhonchi.  Normal respiratory effort. Abdomen:  soft, NT, ND, NABS Back:   normal alignment, no  CVAT Skin:  no rash or induration seen on limited exam. Has stage 2 sacral ulcer and ulcer of perineal area. Superficial ulcers of RLE.  Musculoskeletal:  grossly normal tone BUE/BLE, good ROM, no bony abnormality. BKA of left lower leg.  Lower extremity:  No LE edema.  Limited foot exam with no ulcerations.  2+ distal pulses. Psychiatric:  grossly normal mood and affect, speech fluent and appropriate, AOx3 Neurologic:  CN 2-12 grossly intact, moves all extremities in coordinated fashion, sensation intact   Radiological Exams on Admission: Independently reviewed - see discussion in A/P where applicable  DG Chest Port 1 View  Result Date: 06/25/2022 CLINICAL DATA:  Chest pain EXAM: PORTABLE CHEST 1 VIEW  COMPARISON:  06/15/2022 FINDINGS: Cardiac size is within normal limits. Pulmonary vascularity is unremarkable. New patchy infiltrates are seen in right mid and right lower lung fields. Rest of the lung fields are unremarkable. There is no pleural effusion or pneumothorax. IMPRESSION: New infiltrate is seen in right parahilar region and right lower lung fields suggesting pneumonia. Electronically Signed   By: Elmer Picker M.D.   On: 06/25/2022 14:49   CT Head Wo Contrast  Result Date: 06/25/2022 CLINICAL DATA:  Provided history: Headache, new or worsening. Additional history provided: Increased confusion, found on floor, history of prostate cancer. EXAM: CT HEAD WITHOUT CONTRAST TECHNIQUE: Contiguous axial images were obtained from the base of the skull through the vertex without intravenous contrast. RADIATION DOSE REDUCTION: This exam was performed according to the departmental dose-optimization program which includes automated exposure control, adjustment of the mA and/or kV according to patient size and/or use of iterative reconstruction technique. COMPARISON:  Head CT 06/15/2022.  Brain MRI 07/28/2021. FINDINGS: Brain: Mild generalized parenchymal atrophy. Redemonstrated small chronic  cortical/subcortical infarcts within the lateral right parietal and temporal lobes. Background mild patchy and ill-defined hypoattenuation within the cerebral white matter, nonspecific but compatible with chronic small vessel ischemic disease. Chronic infarcts within the right corona radiata, right basal ganglia and bilateral cerebellar hemispheres, some of which were better appreciated on the prior brain MRI of 07/28/2021. There is no acute intracranial hemorrhage. No acute demarcated cortical infarct. No extra-axial fluid collection. No evidence of an intracranial mass. No midline shift. Vascular: No hyperdense vessel.  Atherosclerotic calcifications. Skull: No fracture or aggressive osseous lesion. Sinuses/Orbits: No mass or acute finding within the imaged orbits. Tiny mucous retention cyst within the right maxillary sinus. Minimal mucosal thickening within the inferior left maxillary sinus, within the bilateral ethmoid air cells and within the left frontoethmoidal recess. IMPRESSION: No evidence of acute intracranial abnormality. 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 Mild paranasal sinus disease, as described. Electronically Signed   By: Kellie Simmering D.O.   On: 06/25/2022 14:18    EKG: Independently reviewed.  Sinus tachycardia with rate 109; nonspecific ST changes with no evidence of acute ischemia ST elevation change in avf and III from previous ekg.    Labs on Admission: I have personally reviewed the available labs and imaging studies at the time of the admission.  Pertinent labs:   blood sugar: 37,  wbc: 16.5,  lactic acid: 2.4,  fecal occult positive,   Assessment and Plan: Principal Problem:   Type 2 diabetes mellitus with hypoglycemia (HCC) Active Problems:   sepsis secondary to HCAP and possible aspiration    CAD s/p NSTEMI and LHC on 06/17/22 with NSTEMI   Acute  metabolic encephalopathy   Hematuria and +fecal occult    AKI (acute kidney injury) ([place])   New onset atrial fibrillation (HCC)   Essential hypertension   Chronic systolic CHF (congestive heart failure) (HCC)   Pressure ulcer of sacral region, stage 2 (HCC)   Dyslipidemia    Assessment and Plan: * Type 2 diabetes mellitus with hypoglycemia ([place]) 66 year old male who was found altered and down in his home for unknown amount of time presenting ot ED with a blood sugar of 37 -admit to progressive -continue D10 1/2NS and will check routine/frequent accuchecks q 2 hours. Care order to page floor doc when blood sugar >120 x 2  -supposedly should not be taking tresiba,  but took his 40 units as well as jardiance and metformin -will hold all meds/insulin at this time  -was just admitted for similar scenario and will need education on what diabetic drugs he should be taking as well as diet.   sepsis secondary to HCAP and possible aspiration  Elevated WBC, tachycardia, elevated lactic acid and CXR finding of new infiltrate seen in right parahilar region and right lower lung fields suggesting pneumonia -continue broad spectrum abx -check PCT -BC pending -urinary antigens  -received '30mg'$ /kg IVF resuscitation and now on D101/2NS -trend lactic acid  -albuterol prn  -trend CBC    CAD s/p NSTEMI and LHC on 06/17/22 with NSTEMI Admitted 8/26 for exertional chest pain and dyspnea found to have NSTEMI  -Cardiology consulted, now s/p heart cath 8/28. Findings reviewed, notable for severe multivessel CAD with sereve calcification.  -Cardiology discussed with CTS. Determined that there are not any good targets for PCI and not candidate for CABG. Recs for medical management per Cardiology -he denies any chest pain today, but troponin at [id number] -cardiology has been consulted -telemetry  -continue medical management with high dose statin. Holding beta blocker with hypotension -hold eliquis/plavix/ASA  with gross hematuria and +fecal occult for tonight   Acute metabolic encephalopathy Likely secondary to hypoglycemia. Resolved with D50 and alert and oriented x 4.  Also has sepsis with possible pneumonia. Found down ? Aspiration as well.  Continue broad spectrum abx until urine back, PCT pending CT head with no acute finding -troponin elevated, similar to NSTEMI and cardiology has been consulted  -check CK   Hematuria and +fecal occult  Frank blood in urine and +fecal occult He has been taking eliquis/brilinta and plavix  He endorses one episode of dark stool Discussed with GI and no need to consult in setting of clinical picture . Consult if hgb drops Cbc q6 hours x 2 Start protonix '40mg'$  PO BID Okay for diet  AKI (acute kidney injury) ([place]) Creatinine typically normal and mildly bumped up to 1.45 today Prerenal in setting of hypotension and likely dehydration since found down for unknown period of time, ATN with sepsis. CK pending  UA pending Strict I/O Hold nephrotoxic meds Maintain MAP >65 Continue IVF Trend    New onset atrial fibrillation (HCC) In NSR, mildly tachycardic in setting of sepsis and hypotension Holding beta blocker with hypotension Holding eliquis tonight with +fecal occult and frank hematuria   Essential hypertension Hypotensive Hold jardiance, lisinopril and metoprolol   Chronic systolic CHF (congestive heart failure) (HCC) Dry on exam Received aggressive IVF in ED, now on D10-1/2NS and blood pressure normotensive Echo 06/17/2022: EF of 40-45% with mildly decreased LVF. Diastolic indeterminate. Right systolic function mildly reduced.  Strict I/O Holding ACE-I, toprol, jardiance with hypotension   Pressure ulcer of sacral region, stage 2 (HCC) Wound care consult  Dyslipidemia Continue high dose lipitor     Advance Care Planning:   Code Status: DNR   Consults: cardiology, wound care   DVT Prophylaxis: SCDs  Family Communication: none    Severity of Illness: The appropriate patient status for this patient is OBSERVATION. Observation status is judged to be reasonable and necessary in order to provide the required intensity of service to ensure the patient's safety. [name] presenting symptoms, physical exam findings, and initial radiographic and laboratory data in the context of their medical condition is felt to place them at decreased risk for further clinical deterioration. Furthermore, it is anticipated that the patient will be medically stable for  discharge from the hospital within 2 midnights of admission.   Author: Orma Flaming, MD 06/25/2022 7:56 PM  For on call review [email or web address].

## 2022-06-25 NOTE — ED Provider Notes (Signed)
[facility] EMERGENCY DEPARTMENT Provider Note   CSN: [id number] Arrival date & time: 06/25/22  1157     History  Chief Complaint  Patient presents with   Altered Mental Status    Peter Yang is a 66 y.o. male.  HPI 66 year old male recent admission with hypoglycemia, NSTEMI, new onset A-fib, history of BPH, diabetes, hypertension, peripheral vascular disease, was found today after welfare check profoundly altered, with emesis and bloody stool and bloody urine around him.  EMS states that he was normoglycemic at the scene and not initially able to speak to them.  They state that after they got him on the monitor and some oxygen he was somewhat more verbal.  Here he complains of some chest pain but is not able to tell me where he is or give me his name initially.     Home Medications Prior to Admission medications   Medication Sig Start Date End Date Taking? Authorizing Provider  acetaminophen (TYLENOL) 325 MG tablet Take 2 tablets (650 mg total) by mouth every 6 (six) hours as needed for mild pain (or Fever >/= 101). 07/30/21  Yes Sheikh, Omair Latif, DO  apixaban (ELIQUIS) 5 MG TABS tablet Take 1 tablet (5 mg total) by mouth 2 (two) times daily. 06/18/22 07/21/22 Yes Donne Hazel, MD  atorvastatin (LIPITOR) 80 MG tablet Take 80 mg by mouth daily. 03/17/21  Yes [provider]  clopidogrel (PLAVIX) 75 MG tablet Take 1 tablet (75 mg total) by mouth daily. 06/19/22 07/21/22 Yes Donne Hazel, MD  diclofenac Sodium (VOLTAREN) 1 % GEL Apply 2 g topically 2 (two) times daily as needed (pain). 07/20/21  Yes [provider]  empagliflozin (JARDIANCE) 10 MG TABS tablet Take 1 tablet (10 mg total) by mouth daily. 06/19/22 07/21/22 Yes Donne Hazel, MD  gabapentin (NEURONTIN) 300 MG capsule Take 300 mg by mouth at bedtime. 07/20/21  Yes [provider]  Insulin Degludec (TRESIBA FLEXTOUCH [place]) Inject 40 Units into the skin at bedtime.   Yes [provider]  lisinopril (ZESTRIL) 2.5 MG tablet Take 2.5 mg by mouth daily.   Yes [provider]  Magnesium 500 MG TABS Take 500 mg by mouth at bedtime.   Yes [provider]  metFORMIN (GLUCOPHAGE) 500 MG tablet Take 1,000 mg by mouth 2 (two) times daily with a meal.   Yes [provider]  metoprolol succinate (TOPROL-XL) 25 MG 24 hr tablet Take 0.5 tablets (12.5 mg total) by mouth daily. 06/19/22 07/19/22 Yes Donne Hazel, MD  omeprazole (PRILOSEC OTC) 20 MG tablet Take 20 mg by mouth daily.   Yes [provider]  potassium chloride SA (KLOR-CON M) 20 MEQ tablet Take 20 mEq by mouth daily.   Yes [provider]  ticagrelor (BRILINTA) 90 MG TABS tablet Take 90 mg by mouth 2 (two) times daily.   Yes [provider]  finasteride (PROSCAR) 5 MG tablet Take 5 mg by mouth daily. Patient not taking: Reported on 06/25/2022 03/17/21   [provider]      Allergies    Patient has no known allergies.    Review of Systems   Review of Systems  Physical Exam Updated Vital Signs BP 97/68   Pulse (!) 106   Temp 97.8 F (36.6 C) (Oral)   Resp 17   Ht 1.93 m ('6\' 4"'$ )   Wt 94.3 kg   SpO2 99%   BMI 25.32 kg/m  Physical Exam Vitals and  nursing note reviewed.  Constitutional:      Appearance: He is ill-appearing.     Comments: 64 male  HENT:     Head: Normocephalic and atraumatic.     Right Ear: External ear normal.     Left Ear: External ear normal.     Nose: Nose normal.     Mouth/Throat:     Mouth: Mucous membranes are dry.  Eyes:     Comments: Pupils are small and reactive  Cardiovascular:     Rate and Rhythm: Regular rhythm. Tachycardia present.     Comments: Pulses are decreased right foot Left foot has Pulmonary:     Effort: Pulmonary effort is normal.     Breath sounds: Normal breath sounds.     Comments: Left lower extremity with BKA Abdominal:     Palpations: Abdomen is soft.  Musculoskeletal:         General: Normal range of motion.     Cervical back: Normal range of motion.  Skin:    General: Skin is warm.     Capillary Refill: Capillary refill takes less than 2 seconds.     Comments: Dried dark stool on lower extremities  Neurological:     General: No focal deficit present.     Mental Status: He is alert.  Psychiatric:        Mood and Affect: Mood normal.     ED Results / Procedures / Treatments   Labs (all labs ordered are listed, but only abnormal results are displayed) Labs Reviewed  CBC WITH DIFFERENTIAL/PLATELET - Abnormal; Notable for the following components:      Result Value   WBC 16.5 (*)    Neutro Abs 15.1 (*)    Lymphs Abs 0.6 (*)    Abs Immature Granulocytes 0.14 (*)    All other components within normal limits  LACTIC ACID, PLASMA - Abnormal; Notable for the following components:   Lactic Acid, Venous 2.4 (*)    All other components within normal limits  LACTIC ACID, PLASMA - Abnormal; Notable for the following components:   Lactic Acid, Venous 2.6 (*)    All other components within normal limits  COMPREHENSIVE METABOLIC PANEL - Abnormal; Notable for the following components:   CO2 16 (*)    BUN 47 (*)    Creatinine, Ser 1.45 (*)    Calcium 8.7 (*)    Total Protein 6.1 (*)    Albumin 2.7 (*)    AST 67 (*)    GFR, Estimated 53 (*)    All other components within normal limits  URINALYSIS, ROUTINE W REFLEX MICROSCOPIC - Abnormal; Notable for the following components:   Color, Urine RED (*)    APPearance TURBID (*)    Glucose, UA 100 (*)    Hgb urine dipstick LARGE (*)    Bilirubin Urine SMALL (*)    Ketones, ur 20 (*)    Protein, ur >300 (*)    Leukocytes,Ua MODERATE (*)    All other components within normal limits  CK - Abnormal; Notable for the following components:   Total CK 466 (*)    All other components within normal limits  URINALYSIS, MICROSCOPIC (REFLEX) - Abnormal; Notable for the following components:   Bacteria, UA RARE (*)    All  other components within normal limits  CBG MONITORING, ED - Abnormal; Notable for the following components:   Glucose-Capillary 37 (*)    All other components within normal limits  POC OCCULT BLOOD, ED -  Abnormal; Notable for the following components:   Fecal Occult Bld POSITIVE (*)    All other components within normal limits  I-STAT VENOUS BLOOD GAS, ED - Abnormal; Notable for the following components:   pCO2, Ven 21.0 (*)    Bicarbonate 11.9 (*)    TCO2 13 (*)    Acid-base deficit 11.0 (*)    All other components within normal limits  TROPONIN I (HIGH SENSITIVITY) - Abnormal; Notable for the following components:   Troponin I (High Sensitivity) 7,765 (*)    All other components within normal limits  CULTURE, BLOOD (ROUTINE X 2)  CULTURE, BLOOD (ROUTINE X 2)  URINE CULTURE  EXPECTORATED SPUTUM ASSESSMENT W GRAM STAIN, RFLX TO RESP C  PROTIME-INR  RAPID URINE DRUG SCREEN, HOSP PERFORMED  ETHANOL  CBC  CBC  PROCALCITONIN  CBC  LACTIC ACID, PLASMA  LACTIC ACID, PLASMA  LEGIONELLA PNEUMOPHILA SEROGP 1 UR AG  STREP PNEUMONIAE URINARY ANTIGEN  CBG MONITORING, ED  CBG MONITORING, ED  I-STAT ARTERIAL BLOOD GAS, ED    EKG EKG Interpretation  Date/Time:  Tuesday June 25 2022 12:20:56 EDT Ventricular Rate:  109 PR Interval:  154 QRS Duration: 112 QT Interval:  324 QTC Calculation: 437 R Axis:   -43 Text Interpretation: Sinus tachycardia Borderline IVCD with LAD Inferior infarct, old Anterolateral infarct, age indeterminate mimal st elevation inferior leads Confirmed by Pattricia Boss [phone]) on 06/25/2022 2:00:55 PM  Radiology DG Chest Port 1 View  Result Date: 06/25/2022 CLINICAL DATA:  Chest pain EXAM: PORTABLE CHEST 1 VIEW COMPARISON:  06/15/2022 FINDINGS: Cardiac size is within normal limits. Pulmonary vascularity is unremarkable. New patchy infiltrates are seen in right mid and right lower lung fields. Rest of the lung fields are unremarkable. There is no pleural  effusion or pneumothorax. IMPRESSION: New infiltrate is seen in right parahilar region and right lower lung fields suggesting pneumonia. Electronically Signed   By: Elmer Picker M.D.   On: 06/25/2022 14:49   CT Head Wo Contrast  Result Date: 06/25/2022 CLINICAL DATA:  Provided history: Headache, new or worsening. Additional history provided: Increased confusion, found on floor, history of prostate cancer. EXAM: CT HEAD WITHOUT CONTRAST TECHNIQUE: Contiguous axial images were obtained from the base of the skull through the vertex without intravenous contrast. RADIATION DOSE REDUCTION: This exam was performed according to the departmental dose-optimization program which includes automated exposure control, adjustment of the mA and/or kV according to patient size and/or use of iterative reconstruction technique. COMPARISON:  Head CT 06/15/2022.  Brain MRI 07/28/2021. FINDINGS: Brain: Mild generalized parenchymal atrophy. Redemonstrated small chronic cortical/subcortical infarcts within the lateral right parietal and temporal lobes. Background mild patchy and ill-defined hypoattenuation within the cerebral white matter, nonspecific but compatible with chronic small vessel ischemic disease. Chronic infarcts within the right corona radiata, right basal ganglia and bilateral cerebellar hemispheres, some of which were better appreciated on the prior brain MRI of 07/28/2021. There is no acute intracranial hemorrhage. No acute demarcated cortical infarct. No extra-axial fluid collection. No evidence of an intracranial mass. No midline shift. Vascular: No hyperdense vessel.  Atherosclerotic calcifications. Skull: No fracture or aggressive osseous lesion. Sinuses/Orbits: No mass or acute finding within the imaged orbits. Tiny mucous retention cyst within the right maxillary sinus. Minimal mucosal thickening within the inferior left maxillary sinus, within the bilateral ethmoid air cells and within the left  frontoethmoidal recess. IMPRESSION: No evidence of acute intracranial abnormality. 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 Mild paranasal sinus disease, as described. Electronically Signed   By: Kellie Simmering D.O.   On: 06/25/2022 14:18    Procedures .Critical Care  Performed by: Pattricia Boss, MD Authorized by: Pattricia Boss, MD   Critical care provider statement:    Critical care time (minutes):  60   Critical care was necessary to treat or prevent imminent or life-threatening deterioration of the following conditions:  Sepsis and CNS failure or compromise     Medications Ordered in ED Medications  dextrose 50 % solution (  Not Given 06/25/22 1215)  dextrose 10 % and 0.45 % NaCl infusion ( Intravenous Rate/Dose Change 06/25/22 1516)  metroNIDAZOLE (FLAGYL) IVPB 500 mg (has no administration in time range)  ceFEPIme (MAXIPIME) 2 g in sodium chloride 0.9 % 100 mL IVPB (2 g Intravenous New Bag/Given 06/25/22 1444)  vancomycin (VANCOREADY) IVPB 2000 mg/400 mL (2,000 mg Intravenous New Bag/Given 06/25/22 1526)  pantoprazole (PROTONIX) EC tablet 40 mg (has no administration in time range)  acetaminophen (TYLENOL) tablet 650 mg (has no administration in time range)    Or  acetaminophen (TYLENOL) suppository 650 mg (has no administration in time range)  lactated ringers bolus 500 mL (0 mLs Intravenous Stopped 06/25/22 1330)  dextrose 50 % solution 50 mL (50 mLs Intravenous Given 06/25/22 1215)  lactated ringers bolus 1,000 mL (0 mLs Intravenous Stopped 06/25/22 1403)    And  lactated ringers bolus 1,000 mL (1,000 mLs Intravenous New Bag/Given 06/25/22 1452)    And  lactated ringers bolus 1,000 mL (0 mLs Intravenous Paused 06/25/22 1518)    ED Course/ Medical Decision Making/ A&P Clinical Course as of 06/25/22 1607  Tue Jun 25, 2022  1431 CBC reviewed and significant for leukocytosis of 16,500  with left shift neutrophils 91% otherwise hemoglobin and platelets within normal limits [DR]  1432 POC occult blood, ED RN will collect(!) Hemoccult reviewed interpreted within normal limits [DR]  1433 I-Stat venous blood gas, ED(!) I-STAT reviewed and interpreted with pH 7.36, PCO2 21, bicarb 11 Sodium 136 Potassium 4.5 [DR]  1433 Lactic acid, plasma(!!) First lactic acid reviewed and interpreted and elevated at 2.4 [DR]  1501 Chest x-Karman Biswell reviewed interpreted new right perihilar right lower lobe infiltrate consistent with pneumonia [DR]    Clinical Course User Index [DR] Pattricia Boss, MD                           Medical Decision Making 66 year old male presented with acutely altered mental status Here his blood sugar was checked and was in the 30s He was given an amp of D50 and has become much more alert. He is complaining of some chest pain Review of last admission had NSTEMI with elevated troponins Review of recent cardiac catheterization  Conclusions: 1. 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 2. Normal left ventricular filling pressure (LVEDP 10 mmHg).   Recommendations: 1. Consider viability study with particular attention to the inferior wall.  If RCA territory is viable, cardiac surgery consultation for CABG should be considered.  If RCA territory is not viable (or the patient is not a candidate for CABG), medical therapy versus  PCI to dominant branch of D1 (particularly if the patient reports recurrent angina) should be considered. 2. Hold ticagrelor pending further workup and possible cardiac surgery consultation.  If the patient does not undergo CABG,  I would favor 12 months of clopidogrel + DOAC therapy in the setting of new atrial fibrillation and NSTEMI. 3. Aggressive secondary prevention of coronary artery disease.   1- altered mental status- likely secondary to hypoglycemia with patient on metformin and jardiance.  Unclear when last po.  Patient states takes tresiba 40 units qhs although unclear from d/c summary if scheduled or not. D50 given and patient started on D10 drip 2- diarrhea 3- gi bleed- patient with normal hgb, recently started on eliquis for a fib 4- known cad with chest pain ? Ekg changes-patient currently denying chest pain. Lab called rn with report of trop at 5000 but stated hemolyzed and requested repeat.  Last trop at 5000 5 now with elevated lactic acid, tachycardia and hypotension- will activate as code sepsis and initiate fluids and broad specturm abx 6-x-Nanette Wirsing reviewed interpreted and right lower lobe infiltrate and perihilar infiltrate noted   Amount and/or Complexity of Data Reviewed External Data Reviewed: notes.    Details: Reviewed last discharge summary Labs: ordered. Decision-making details documented in ED Course. Radiology: ordered and independent interpretation performed. Decision-making details documented in ED Course. ECG/medicine tests: ordered and independent interpretation performed. Decision-making details documented in ED Course. Discussion of management or test interpretation with external provider(s): Discussed care with Dr. Eliberto Ivory, on-call for hospitalist Discussed care with Trish, on for cardiology and they will see in consultation  Risk Prescription drug management. Decision regarding hospitalization.         Final Clinical Impression(s) / ED Diagnoses Final diagnoses:  Altered mental status, unspecified altered mental status type  Hypoglycemia    Rx / DC Orders ED Discharge Orders     None         Pattricia Boss, MD 06/25/22 [phone]

## 2022-06-25 NOTE — Sepsis Progress Note (Signed)
Notified bedside nurse of need to administer antibiotics.  

## 2022-06-25 NOTE — Assessment & Plan Note (Addendum)
Admitted 8/26 for exertional chest pain and dyspnea found to have NSTEMI  -Cardiology consulted, now s/p heart cath 8/28. Findings reviewed, notable for severe multivessel CAD with sereve calcification.  -Cardiology discussed with CTS. Determined that there are not any good targets for PCI and not candidate for CABG. Recs for medical management per Cardiology -he denies any chest pain today, but troponin at 5800>6349 -cardiology has been consulted -telemetry  -continue medical management with high dose statin. Holding beta blocker with hypotension -hold eliquis/plavix/ASA with gross hematuria and +fecal occult for tonight

## 2022-06-25 NOTE — ED Triage Notes (Signed)
Pt received to ER 28. EMS   reports pt non-verbal alert to voice upon arrival. Pt found lying flat on bed, lying in blood, stool and urine. Pt gurgling upon EMS arrival. Pt placed on O2.during transport. Pt CBG 32 via fingerstick upon arrival. Pt turned and cleaned up of diarrhea. Dried blood noted on pt left thigh.

## 2022-06-25 NOTE — Consult Note (Addendum)
Cardiology Consultation   Patient ID: Quentavious Rittenhouse MRN: [id number]; DOB: April 13, 1956  Admit date: 06/25/2022 Date of Consult: 06/25/2022  PCP:  Clovia Cuff, MD   [facility] Providers Cardiologist:  Dr Maylene Roes, [place]   Patient Profile:   Carzell Saldivar is a 66 y.o. male with PMH of multi-vessel CAD s/p PTCA and PCI to RCA 02/11/22, ischemic cardiomyopathy with LVEF 40 to 45%, HTN, CVA, type 2 DM, PVD, left BKA, OSA, BPH, sacral pressure ulcer, who is being seen 06/25/2022 for the evaluation of elevated troponin at the request of Dr Rogers Blocker.  History of Present Illness:   Mr. Kulpa follows cardiology Dr Maylene Roes at [facility] for CAD. He had let heart cath on 02/11/22 at [facility] showing severe multivessel CAD including CTO ostial Lcx and heavy calcified RCA s/p PTCA and PCI with DES (?unable to cross lesion per cath report) , non-obstructive LAD disease, LVEDP 18 mmHg. He was advised taking ASA and Brilinta for 1 year. Echo from 02/11/22 showed LVEF 35-40%, mod LAE, mild MR, global HPK. He was placed on Lasix, Jardiance, lisinopril, metoprolol for GDMT.   He was hospitalized here from 06/15/2022 to 06/18/2022 for non-STEMI.  He had chest pain, shortness of breath, was found unresponsive during welfare check.  High sensitive troponin peaked at 6096.  EKG revealed new onset atrial fibrillation, lateral T wave inversion.  Given lacking of successful stenting RCA on 01/2022, he was arranged for repeat heart catheterization on 06/17/2022 by Dr. Saunders Revel, which showed severe multivessel CAD including 50% mid LAD, 95% dominant branch of large D1, CTO of lateral branch of D1, CTO of left circumflex, CTO of ostial through mid RCA (including stent placed on 4/23) with left-to-right collaterals.  LVEDP 10 mmHg. After reviewing with intervention team (Dr End and Dr Ali Lowe), it was felt there is no good target for PCI and patient is also not a good candidate for CABG.  He was recommended continue  medical management, transitioned from Brilinta to Plavix, continued aspirin, also started on Eliquis for new onset of atrial fibrillation.  He remained euvolemic during last hospitalization. Repeat echo from 06/15/22 with LVEF 40 to 45%, moderate hypokinesis of LV, basal mid inferior wall, inferolateral wall, mildly reduced RV, severe LAE, moderate MR, moderate dilatation of ascending aorta 47 mm and mild dilatation of aortic root 42 mm. GDMT was held due to AKI, Jardiance was resumed at 10 mg for GDMT, his blood pressure was too low where lisinopril/spironolactone/Lasix were discontinued, he was only maintained on metoprolol XL.  His course was also complicated by hypoglycemia with glucose at 30s.  He was discharged on 06/18/2022 to home.  Patient returned to the ER 06/25/2022 for altered mental status.  Per chart review, he was found profoundly altered with emesis and sent to and urine around him upon welfare check.  Patient's friend has been trying to call him over the past few days, went to his house and found the house was a mess with his stool and urine everywhere.  EMS was called, he was found with glucose of 37 with D50 bolus was given, he immediately regained consciousness.  He could not recall anything during encounter. He states the last thing he remembers is he was looking for his glasses at home and woke up at ED.   He reported taking Tresiba 40 units, although last discharge summary states he was not taking it. He states he is eating normally. He had one episode of chest pain where  he was going to the kitchen in his wheelchair last week since discharge. He remembers his pain lasted 30 minutes and he did not take any medication. He denied any chest pain now, denied any SOB, weight gain, leg edema. He states his sugar kept getting low. He states ER told him that he had diarrhea. He denied any fever, chills, cough, abdominal pain. He states he has a lot pain from his sacral ulcer and wants pain medication.    Admission diagnostic revealed VBG pH 7.36.  Leukocytosis with WBC 16 500. Lactic acid 2.6.  Urinalysis with gross pyuria.  CMP with elevated BUN 47, creatinine 1.45, GFR 53, albumin 2.7, AST 67.  High sensitive troponin 7765.  CK 466.  FOBT positive.  Procal 2.26. CT head revealed no acute finding.  Chest x-ray showed new right perihilar and right lower lung infiltrate suggesting pneumonia.  EKG revealed sinus tachycardia 109 bpm, old inferior infarct, no acute ischemic change.  He is admitted to hospital medicine service for sepsis, started on vancomycin and cefepime and Flagyl for healthcare associated pneumonia + aspiration, given 3.5 L LR bolus, started on D10.  Cardiology is consulted given elevated troponin.      Past Medical History:  Diagnosis Date   BPH with obstruction/lower urinary tract symptoms 02/02/2018   Controlled type 2 diabetes mellitus with hyperglycemia, with long-term current use of insulin ([place]) 06/28/2021   Diabetic polyneuropathy associated with type 2 diabetes mellitus ([place]) 07/28/2021   Essential hypertension 01/25/2016   Hypertriglyceridemia    Mixed diabetic hyperlipidemia associated with type 2 diabetes mellitus ([place]) 07/28/2021   OSA on CPAP    TIA (transient ischemic attack) 2012    Past Surgical History:  Procedure Laterality Date   BELOW KNEE LEG AMPUTATION Left 2022   CORONARY ANGIOPLASTY WITH STENT PLACEMENT  01/2022   DES RCA   LEFT HEART CATH AND CORONARY ANGIOGRAPHY N/A 06/17/2022   Procedure: LEFT HEART CATH AND CORONARY ANGIOGRAPHY;  Surgeon: Nelva Bush, MD;  Location: [place] [facility];  Service: Cardiovascular;  Laterality: N/A;   ROTATOR CUFF REPAIR Left      Home Medications:  Prior to Admission medications   Medication Sig Start Date End Date Taking? Authorizing Provider  acetaminophen (TYLENOL) 325 MG tablet Take 2 tablets (650 mg total) by mouth every 6 (six) hours as needed for mild pain (or Fever >/= 101). 07/30/21  Yes Sheikh,  Omair Latif, DO  apixaban (ELIQUIS) 5 MG TABS tablet Take 1 tablet (5 mg total) by mouth 2 (two) times daily. 06/18/22 07/21/22 Yes Donne Hazel, MD  atorvastatin (LIPITOR) 80 MG tablet Take 80 mg by mouth daily. 03/17/21  Yes [provider]  clopidogrel (PLAVIX) 75 MG tablet Take 1 tablet (75 mg total) by mouth daily. 06/19/22 07/21/22 Yes Donne Hazel, MD  diclofenac Sodium (VOLTAREN) 1 % GEL Apply 2 g topically 2 (two) times daily as needed (pain). 07/20/21  Yes [provider]  empagliflozin (JARDIANCE) 10 MG TABS tablet Take 1 tablet (10 mg total) by mouth daily. 06/19/22 07/21/22 Yes Donne Hazel, MD  gabapentin (NEURONTIN) 300 MG capsule Take 300 mg by mouth at bedtime. 07/20/21  Yes [provider]  Insulin Degludec (TRESIBA FLEXTOUCH [place]) Inject 40 Units into the skin at bedtime.   Yes [provider]  lisinopril (ZESTRIL) 2.5 MG tablet Take 2.5 mg by mouth daily.   Yes [provider]  Magnesium 500 MG TABS Take 500 mg by mouth at bedtime.  Yes [provider]  metFORMIN (GLUCOPHAGE) 500 MG tablet Take 1,000 mg by mouth 2 (two) times daily with a meal.   Yes [provider]  metoprolol succinate (TOPROL-XL) 25 MG 24 hr tablet Take 0.5 tablets (12.5 mg total) by mouth daily. 06/19/22 07/19/22 Yes Donne Hazel, MD  omeprazole (PRILOSEC OTC) 20 MG tablet Take 20 mg by mouth daily.   Yes [provider]  potassium chloride SA (KLOR-CON M) 20 MEQ tablet Take 20 mEq by mouth daily.   Yes [provider]  ticagrelor (BRILINTA) 90 MG TABS tablet Take 90 mg by mouth 2 (two) times daily.   Yes [provider]  finasteride (PROSCAR) 5 MG tablet Take 5 mg by mouth daily. Patient not taking: Reported on 06/25/2022 03/17/21   [provider]    Inpatient Medications: Scheduled Meds:  dextrose       Continuous Infusions:  ceFEPime (MAXIPIME) IV 2 g (06/25/22 1444)   dextrose 10 % and 0.45 % NaCl 50  mL/hr at 06/25/22 1516   lactated ringers 1,000 mL (06/25/22 1452)   metronidazole     vancomycin     PRN Meds: dextrose  Allergies:   No Known Allergies  Social History:   Social History   Socioeconomic History   Marital status: Single    Spouse name: Not on file   Number of children: Not on file   Years of education: Not on file   Highest education level: Not on file  Occupational History   Not on file  Tobacco Use   Smoking status: Never   Smokeless tobacco: Never  Substance and Sexual Activity   Alcohol use: Never   Drug use: Never   Sexual activity: Not on file  Other Topics Concern   Not on file  Social History Narrative   Not on file   Social Determinants of Health   Financial Resource Strain: Not on file  Food Insecurity: Not on file  Transportation Needs: Not on file  Physical Activity: Not on file  Stress: Not on file  Social Connections: Not on file  Intimate Partner Violence: Not on file    Family History:    Family History  Problem Relation Age of Onset   Diabetes Mother    Hypertension Mother    Heart disease Mother    Cancer Father    Heart attack Sister    Cancer Sister    Diabetes Brother      ROS:  Constitutional: see HPI  Eyes: Denied vision change or loss Ears/Nose/Mouth/Throat: Denied ear ache, sore throat, coughing, sinus pain Cardiovascular:see HPI  Respiratory: see HPI  Gastrointestinal: Denied nausea, vomiting, abdominal pain, diarrhea Genital/Urinary: Denied dysuria, hematuria, urinary frequency/urgency Musculoskeletal: Denied muscle ache, joint pain Skin: Denied rash, wound Neuro: SEE HPI  Psych: Denied history of depression/anxiety  Endocrine: history of diabetes   Physical Exam/Data:   Vitals:   06/25/22 1400 06/25/22 1415 06/25/22 1430 06/25/22 1445  BP:  '94/70 99/70 97/68 '$  Pulse: (!) 103 (!) 107 (!) 109 (!) 106  Resp: '18 16 18 17  '$ Temp:      TempSrc:      SpO2: 99% 99% 96% 99%  Weight:      Height:         Intake/Output Summary (Last 24 hours) at 06/25/2022 1520 Last data filed at 06/25/2022 1330 Gross per 24 hour  Intake 1116.69 ml  Output --  Net 1116.69 ml      06/25/2022  12:19 PM 06/18/2022    3:57 AM 06/17/2022    5:28 AM  Last 3 Weights  Weight (lbs) 208 lb 210 lb 1.6 oz 208 lb 11.2 oz  Weight (kg) 94.348 kg 95.3 kg 94.666 kg     Body mass index is 25.32 kg/m.   Vitals:  Vitals:   06/25/22 1430 06/25/22 1445  BP: 99/70 97/68  Pulse: (!) 109 (!) 106  Resp: 18 17  Temp:    SpO2: 96% 99%   General Appearance: In no apparent distress, laying in bed HEENT: Normocephalic, atraumatic.  Neck: Supple, trachea midline, no JVDs Cardiovascular: Regular rate and rhythm, normal S1-S2,  no murmur Respiratory: Resting breathing unlabored, lungs sounds clear to auscultation bilaterally, no use of accessory muscles. On room air.  No wheezes, rales or rhonchi.   Gastrointestinal: Bowel sounds positive, abdomen soft Extremities: s/p L BKA, no edema of RLE Musculoskeletal:General muscular atrophy  Skin: Intact, warm, dry. No rashes Neurologic: Alert, oriented to person, place and time. Fluent speech, no cognitive deficit Psychiatric: Limited insight     EKG:  The EKG was personally reviewed and demonstrates:   Sinus tachycardia 109 bpm, old inferior infarct   Telemetry:  Telemetry was personally reviewed and demonstrates:    Sinus rhythm/tachycardia 90-110s    Relevant CV Studies:  Echocardiogram from 06/15/2022:  1. Left ventricular ejection fraction, by estimation, is 40 to 45%. The  left ventricle has mildly decreased function. The left ventricle  demonstrates regional wall motion abnormalities (see scoring  diagram/findings for description). There is mild left  ventricular hypertrophy. Left ventricular diastolic function could not be  evaluated. There is moderate hypokinesis of the left ventricular,  basal-mid inferior wall and inferolateral wall.   2. Right ventricular  systolic function is mildly reduced. The right  ventricular size is normal.   3. Left atrial size was severely dilated.   4. The mitral valve is abnormal. Moderate mitral valve regurgitation.   5. The aortic valve is tricuspid. Aortic valve regurgitation is not  visualized.   6. Aortic dilatation noted. There is moderate dilatation of the ascending  aorta, measuring 47 mm. There is mild dilatation of the aortic root,  measuring 42 mm.   7. The inferior vena cava is normal in size with greater than 50%  respiratory variability, suggesting right atrial pressure of 3 mmHg.   Comparison(s): Changes from prior study are noted. 02/11/2022 [facility]):  LVEF 35-40%, global hypokinesis worse laterally. \\  Left heart catheterization from 06/17/2022:  Conclusions: Severe multivessel coronary artery disease with severe calcification, as detailed below, including 50% mid LAD stenosis, 95% stenosis of dominant branch of large D1 and chronic total occlusion of lateral branch of D1, chronic total occlusion of LCx, and chronic total occlusion of ostial through mid RCA (including stent placed in 01/2022) with left-to-right collaterals. Normal left ventricular filling pressure (LVEDP 10 mmHg).   Recommendations: Consider viability study with particular attention to the inferior wall.  If RCA territory is viable, cardiac surgery consultation for CABG should be considered.  If RCA territory is not viable (or the patient is not a candidate for CABG), medical therapy versus PCI to dominant branch of D1 (particularly if the patient reports recurrent angina) should be considered. Hold ticagrelor pending further workup and possible cardiac surgery consultation.  If the patient does not undergo CABG, I would favor 12 months of clopidogrel + DOAC therapy in the setting of new atrial fibrillation and NSTEMI. Aggressive secondary prevention of coronary artery disease.  Laboratory Data:  High Sensitivity Troponin:    Recent Labs  Lab 06/15/22 0209 06/15/22 0413 06/15/22 1135 06/15/22 1522  TROPONINIHS 1,420* 2,596* 6,096* 5,251*     Chemistry Recent Labs  Lab 06/25/22 1257  NA 136  K 4.5    No results for input(s): "PROT", "ALBUMIN", "AST", "ALT", "ALKPHOS", "BILITOT" in the last 168 hours. Lipids No results for input(s): "CHOL", "TRIG", "HDL", "LABVLDL", "LDLCALC", "CHOLHDL" in the last 168 hours.  Hematology Recent Labs  Lab 06/25/22 1230 06/25/22 1257  WBC 16.5*  --   RBC 4.29  --   HGB 14.1 14.6  HCT 42.3 43.0  MCV 98.6  --   MCH 32.9  --   MCHC 33.3  --   RDW 14.6  --   PLT 267  --    Thyroid No results for input(s): "TSH", "FREET4" in the last 168 hours.  BNPNo results for input(s): "BNP", "PROBNP" in the last 168 hours.  DDimer No results for input(s): "DDIMER" in the last 168 hours.   Radiology/Studies:  DG Chest Port 1 View  Result Date: 06/25/2022 CLINICAL DATA:  Chest pain EXAM: PORTABLE CHEST 1 VIEW COMPARISON:  06/15/2022 FINDINGS: Cardiac size is within normal limits. Pulmonary vascularity is unremarkable. New patchy infiltrates are seen in right mid and right lower lung fields. Rest of the lung fields are unremarkable. There is no pleural effusion or pneumothorax. IMPRESSION: New infiltrate is seen in right parahilar region and right lower lung fields suggesting pneumonia. Electronically Signed   By: Elmer Picker M.D.   On: 06/25/2022 14:49   CT Head Wo Contrast  Result Date: 06/25/2022 CLINICAL DATA:  Provided history: Headache, new or worsening. Additional history provided: Increased confusion, found on floor, history of prostate cancer. EXAM: CT HEAD WITHOUT CONTRAST TECHNIQUE: Contiguous axial images were obtained from the base of the skull through the vertex without intravenous contrast. RADIATION DOSE REDUCTION: This exam was performed according to the departmental dose-optimization program which includes automated exposure control, adjustment of the mA  and/or kV according to patient size and/or use of iterative reconstruction technique. COMPARISON:  Head CT 06/15/2022.  Brain MRI 07/28/2021. FINDINGS: Brain: Mild generalized parenchymal atrophy. Redemonstrated small chronic cortical/subcortical infarcts within the lateral right parietal and temporal lobes. Background mild patchy and ill-defined hypoattenuation within the cerebral white matter, nonspecific but compatible with chronic small vessel ischemic disease. Chronic infarcts within the right corona radiata, right basal ganglia and bilateral cerebellar hemispheres, some of which were better appreciated on the prior brain MRI of 07/28/2021. There is no acute intracranial hemorrhage. No acute demarcated cortical infarct. No extra-axial fluid collection. No evidence of an intracranial mass. No midline shift. Vascular: No hyperdense vessel.  Atherosclerotic calcifications. Skull: No fracture or aggressive osseous lesion. Sinuses/Orbits: No mass or acute finding within the imaged orbits. Tiny mucous retention cyst within the right maxillary sinus. Minimal mucosal thickening within the inferior left maxillary sinus, within the bilateral ethmoid air cells and within the left frontoethmoidal recess. IMPRESSION: No evidence of acute intracranial abnormality. 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 Mild paranasal sinus disease, as described. Electronically Signed   By: Kellie Simmering D.O.   On: 06/25/2022 14:18     Assessment and Plan:   Non-STEMI -Presented with altered mental status, in the setting of hypoglycemia and sepsis 2/2 healthcare-associated pneumonia + aspiration - reports one episode of chest pain at home lasting 30 minuets  while going to kitchen in his wheelchair  -High sensitive troponin 7765 x1 -EKG did not reveal acute ischemic changes -Most recent heart catheterization 06/17/2022  revealed multivessel CAD, 50% mid LAD stenosis, 95% stenosis of dominant branch of large D1, CTO of lateral branch of D1, CTO of left circumflex, CTO of ostial through mid RCA with left-to-right collaterals (despite stents placed 4/23 at [facility]), case was reviewed with interventional team and patient was felt not a good candidate for CABG and no target for PCI, recommended medical therapy, he was switched from Brilinta to Plavix, continue aspirin, while started on Eliquis for new onset of  A-fib -Suspect demand ischemia, will likely has chronic angina  - FOBT +, Hgb is WNL, likely no acute GI bleed, can continue triple therapy and statin; given sepsis with hypotension, would hold metoprolol - if BP recovers, may add long acting nitrate for anti-anginal therapy   Ischemic cardiomyopathy -LVEF 40 to 45% from recent echocardiogram - clinically euvolemic, agree with IVF for sepsis  -Hold metoprolol due to hypotension -Hold Jardiance due to AKI and hypoglycemia  Paroxysmal atrial fibrillation -Recently diagnosed from August 2023 admission -Rate controlled on metoprolol and remained asymptomatic -Currently in sinus, hold metoprolol in the setting of hypotension -Continue Eliquis if there is no evidence of acute bleeding for CHA2DS2-VASc score of 7  Sepsis with metabolic encephalopathy Presumed healthcare associated pneumonia + also aspiration AKI Type 2 diabetes BPH OSA PVD History of left BKA History of CVA -Per primary team  Risk Assessment/Risk Scores:   New York Heart Association (NYHA) Functional Class NYHA Class I  CHA2DS2-VASc Score = 7   This indicates a 11.2% annual risk of stroke. [name] score is based upon: CHF History: 1 HTN History: 1 Diabetes History: 1 Stroke History: 2 Vascular Disease History: 1 Age Score: 1 Gender Score: 0     For questions or updates, please contact [place] Please consult [email or web address] for contact info under     Signed, Margie Billet, NP  06/25/2022 3:20 PM  Patient seen and examined and agree with Margie Billet, NP as detailed above.  In brief, the patient is a 66 y.o. 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 Cardiology was consulted due to trop elevation 7765.   Patient with known severe multivessel CAD with cath 05/2022 showing 50% mid LAD stenosis, 95% stenosis of dominant branch of large D1, CTO of lateral branch of D1, CTO of left circumflex, CTO of ostial through mid RCA with left-to-right collaterals deemed not a candidate for CABG and no good options for PCI. Has been recommended for medical therapy. Also with HFrEF with LVEF 40-45% with hypokinesis of the basal-to-mid inferior wall and inferolateral wall, moderate MR, moderate ascending aortic aneurysm 79m, RAP 376mg. GDMT has been limited due to hypotension and he has only been able to tolerate metop and jardiance.   Presents on this admission after being found down for unknown duration likely due to profound hypoglycemia with BG 30s. He received D50 with immediate improvement in mental status. Labs also notable for WBC 16500, lactic acid 2.6, Cr 1.45, trop 7765. ECG with sinus tachycardia with inferior infarct. He was hypotensive and received 3.5L IVF, was started on D10, and has been placed on broad spectrum ABX.  Overall, suspect trop elevation likely due to demand in the setting of being down for unknown duration with profound hypoglycemia. He has known significant multivessel CAD and is not a candidate for further intervention. Will continue with medical therapy. He is current dry on exam and hypotensive and unable to tolerate GDMT for HF. Will add as able.   GEN: Discheveled   Neck: No JVD Cardiac: Tachycardic, regular, soft  systolic murmur Respiratory: Clear to auscultation bilaterally. GI: Soft, nontender, non-distended  MS: Left BKA Neuro:  Nonfocal  Psych: Normal affect    Plan: -Suspect trop elevation due to demand in the setting of being down for several days and hypoglycemia; no plans for repeat ischemic evaluation as patient is not a candidate for surgery and has no targets for PCI based on cath 06/17/22 -Will continue medical therapy of underlying CAD -Cannot tolerate GDMT for HF given hypotension and AKI; will add as able -Continue apixaban, ASA and plavix given recent PCI and Afib; monitor H/H -Management of hypoglycemia, possible sepsis and hypotension per primary team  Gwyndolyn Kaufman, MD

## 2022-06-25 NOTE — ED Notes (Signed)
Dr. Jeanell Sparrow aware of pt BP.

## 2022-06-25 NOTE — Assessment & Plan Note (Addendum)
66 year old male who was found altered and down in his home for unknown amount of time presenting ot ED with a blood sugar of 37 -admit to progressive -continue D10 1/2NS and will check routine/frequent accuchecks q 2 hours. Care order to page floor doc when blood sugar >120 x 2  -supposedly should not be taking tresiba, but took his 40 units as well as jardiance and metformin -will hold all meds/insulin at this time  -was just admitted for similar scenario and will need education on what diabetic drugs he should be taking as well as diet.

## 2022-06-25 NOTE — ED Notes (Signed)
Pt unable to locate cell phone. Pt reports cell phone being a black iphone. This nurse called phone x3 and looked in bed, room, floor, linens, trash and in dept. This nurse called CT at this time to verify pt phone not in [place].

## 2022-06-26 DIAGNOSIS — D649 Anemia, unspecified: Secondary | ICD-10-CM | POA: Diagnosis present

## 2022-06-26 DIAGNOSIS — Z794 Long term (current) use of insulin: Secondary | ICD-10-CM | POA: Diagnosis not present

## 2022-06-26 DIAGNOSIS — N179 Acute kidney failure, unspecified: Secondary | ICD-10-CM

## 2022-06-26 DIAGNOSIS — I110 Hypertensive heart disease with heart failure: Secondary | ICD-10-CM | POA: Diagnosis present

## 2022-06-26 DIAGNOSIS — R6520 Severe sepsis without septic shock: Secondary | ICD-10-CM | POA: Diagnosis present

## 2022-06-26 DIAGNOSIS — I4891 Unspecified atrial fibrillation: Secondary | ICD-10-CM | POA: Diagnosis not present

## 2022-06-26 DIAGNOSIS — Y95 Nosocomial condition: Secondary | ICD-10-CM | POA: Diagnosis present

## 2022-06-26 DIAGNOSIS — Z66 Do not resuscitate: Secondary | ICD-10-CM | POA: Diagnosis present

## 2022-06-26 DIAGNOSIS — E11649 Type 2 diabetes mellitus with hypoglycemia without coma: Secondary | ICD-10-CM | POA: Diagnosis present

## 2022-06-26 DIAGNOSIS — I5022 Chronic systolic (congestive) heart failure: Secondary | ICD-10-CM

## 2022-06-26 DIAGNOSIS — Z89512 Acquired absence of left leg below knee: Secondary | ICD-10-CM | POA: Diagnosis not present

## 2022-06-26 DIAGNOSIS — I480 Paroxysmal atrial fibrillation: Secondary | ICD-10-CM | POA: Diagnosis present

## 2022-06-26 DIAGNOSIS — R310 Gross hematuria: Secondary | ICD-10-CM

## 2022-06-26 DIAGNOSIS — G9341 Metabolic encephalopathy: Secondary | ICD-10-CM

## 2022-06-26 DIAGNOSIS — I25110 Atherosclerotic heart disease of native coronary artery with unstable angina pectoris: Secondary | ICD-10-CM

## 2022-06-26 DIAGNOSIS — E876 Hypokalemia: Secondary | ICD-10-CM | POA: Diagnosis not present

## 2022-06-26 DIAGNOSIS — E782 Mixed hyperlipidemia: Secondary | ICD-10-CM | POA: Diagnosis present

## 2022-06-26 DIAGNOSIS — A419 Sepsis, unspecified organism: Secondary | ICD-10-CM

## 2022-06-26 DIAGNOSIS — J189 Pneumonia, unspecified organism: Secondary | ICD-10-CM | POA: Diagnosis present

## 2022-06-26 DIAGNOSIS — E162 Hypoglycemia, unspecified: Secondary | ICD-10-CM | POA: Diagnosis present

## 2022-06-26 DIAGNOSIS — I10 Essential (primary) hypertension: Secondary | ICD-10-CM | POA: Diagnosis not present

## 2022-06-26 DIAGNOSIS — I255 Ischemic cardiomyopathy: Secondary | ICD-10-CM | POA: Diagnosis present

## 2022-06-26 DIAGNOSIS — L89152 Pressure ulcer of sacral region, stage 2: Secondary | ICD-10-CM

## 2022-06-26 DIAGNOSIS — I25119 Atherosclerotic heart disease of native coronary artery with unspecified angina pectoris: Secondary | ICD-10-CM | POA: Diagnosis present

## 2022-06-26 DIAGNOSIS — G4733 Obstructive sleep apnea (adult) (pediatric): Secondary | ICD-10-CM | POA: Diagnosis present

## 2022-06-26 DIAGNOSIS — E785 Hyperlipidemia, unspecified: Secondary | ICD-10-CM

## 2022-06-26 DIAGNOSIS — E1142 Type 2 diabetes mellitus with diabetic polyneuropathy: Secondary | ICD-10-CM | POA: Diagnosis present

## 2022-06-26 DIAGNOSIS — E1151 Type 2 diabetes mellitus with diabetic peripheral angiopathy without gangrene: Secondary | ICD-10-CM | POA: Diagnosis present

## 2022-06-26 DIAGNOSIS — Z79899 Other long term (current) drug therapy: Secondary | ICD-10-CM | POA: Diagnosis not present

## 2022-06-26 DIAGNOSIS — E1169 Type 2 diabetes mellitus with other specified complication: Secondary | ICD-10-CM | POA: Diagnosis present

## 2022-06-26 DIAGNOSIS — I7121 Aneurysm of the ascending aorta, without rupture: Secondary | ICD-10-CM | POA: Diagnosis present

## 2022-06-26 LAB — GLUCOSE, CAPILLARY
Glucose-Capillary: 217 mg/dL — ABNORMAL HIGH (ref 70–99)
Glucose-Capillary: 194 mg/dL — ABNORMAL HIGH (ref 70–99)
Glucose-Capillary: 191 mg/dL — ABNORMAL HIGH (ref 70–99)
Glucose-Capillary: 314 mg/dL — ABNORMAL HIGH (ref 70–99)

## 2022-06-26 LAB — COMPREHENSIVE METABOLIC PANEL
ALT: 26 U/L (ref 0–44)
AST: 46 U/L — ABNORMAL HIGH (ref 15–41)
Albumin: 2.2 g/dL — ABNORMAL LOW (ref 3.5–5.0)
Alkaline Phosphatase: 47 U/L (ref 38–126)
Anion gap: 9 (ref 5–15)
BUN: 41 mg/dL — ABNORMAL HIGH (ref 8–23)
CO2: 15 mmol/L — ABNORMAL LOW (ref 22–32)
Calcium: 7.9 mg/dL — ABNORMAL LOW (ref 8.9–10.3)
Chloride: 108 mmol/L (ref 98–111)
Creatinine, Ser: 1.25 mg/dL — ABNORMAL HIGH (ref 0.61–1.24)
GFR, Estimated: >60 mL/min (ref >60)
Glucose, Bld: 171 mg/dL — ABNORMAL HIGH (ref 70–99)
Potassium: 4.9 mmol/L (ref 3.5–5.1)
Sodium: 132 mmol/L — ABNORMAL LOW (ref 135–145)
Total Bilirubin: 0.8 mg/dL (ref 0.3–1.2)
Total Protein: 5 g/dL — ABNORMAL LOW (ref 6.5–8.1)

## 2022-06-26 LAB — CBC
HCT: 35.9 % — ABNORMAL LOW (ref 39.0–52.0)
Hemoglobin: 11.5 g/dL — ABNORMAL LOW (ref 13.0–17.0)
MCH: 32.5 pg (ref 26.0–34.0)
MCHC: 32 g/dL (ref 30.0–36.0)
MCV: 101.4 fL — ABNORMAL HIGH (ref 80.0–100.0)
Platelets: 134 10*3/uL — ABNORMAL LOW (ref 150–400)
RBC: 3.54 MIL/uL — ABNORMAL LOW (ref 4.22–5.81)
RDW: 14.5 % (ref 11.5–15.5)
WBC: 8.6 10*3/uL (ref 4.0–10.5)
nRBC: 0 % (ref 0.0–0.2)

## 2022-06-26 LAB — LACTIC ACID, PLASMA: Lactic Acid, Venous: 1.2 mmol/L (ref 0.5–1.9)

## 2022-06-26 LAB — MRSA NEXT GEN BY PCR, NASAL: MRSA by PCR Next Gen: NOT DETECTED

## 2022-06-26 LAB — CBG MONITORING, ED
Glucose-Capillary: 169 mg/dL — ABNORMAL HIGH (ref 70–99)
Glucose-Capillary: 171 mg/dL — ABNORMAL HIGH (ref 70–99)
Glucose-Capillary: 176 mg/dL — ABNORMAL HIGH (ref 70–99)
Glucose-Capillary: 189 mg/dL — ABNORMAL HIGH (ref 70–99)

## 2022-06-26 LAB — PROCALCITONIN: Procalcitonin: 2.9 ng/mL

## 2022-06-26 MED ORDER — DEXTROSE-NACL 10-0.45 % IV SOLN
INTRAVENOUS | Status: DC
  Filled 2022-06-26: qty 1000

## 2022-06-26 MED ORDER — CLOPIDOGREL BISULFATE 75 MG PO TABS
75.0000 mg | ORAL_TABLET | Freq: Every day | ORAL | Status: DC
  Administered 2022-06-26 – 2022-06-30 (×5): 75 mg via ORAL
  Filled 2022-06-26 (×5): qty 1

## 2022-06-26 NOTE — Progress Notes (Signed)
Rounding Note    Patient Name: Peter Yang Date of Encounter: 06/26/2022  [place] Cardiologist: None   Subjective   States he is feeling better. No chest pain. BP remains soft in 80-90s/50-60s.  Cr improved to 1.25, lactate normalized to 1.2, procal 2.9, HgB 11.5 but all cell lines down after IVF  Inpatient Medications    Scheduled Meds:  atorvastatin  80 mg Oral Daily   gabapentin  300 mg Oral QHS   pantoprazole  40 mg Oral BID   Continuous Infusions:  ceFEPime (MAXIPIME) IV Stopped (06/26/22 0712)   dextrose 10 % and 0.45 % NaCl 75 mL/hr at 06/25/22 1904   vancomycin     PRN Meds: acetaminophen **OR** acetaminophen   Vital Signs    Vitals:   06/26/22 0630 06/26/22 0645 06/26/22 0700 06/26/22 0719  BP: (!) 81/58 (!) 83/61 (!) 71/56 (!) 83/61  Pulse: 95 98 94 94  Resp: '20 19 16 18  '$ Temp:    98.6 F (37 C)  TempSrc:    Oral  SpO2: 92% 93% 91% 92%  Weight:      Height:        Intake/Output Summary (Last 24 hours) at 06/26/2022 0758 Last data filed at 06/26/2022 9024 Gross per 24 hour  Intake 1810.09 ml  Output 800 ml  Net 1010.09 ml      06/25/2022   12:19 PM 06/18/2022    3:57 AM 06/17/2022    5:28 AM  Last 3 Weights  Weight (lbs) 208 lb 210 lb 1.6 oz 208 lb 11.2 oz  Weight (kg) 94.348 kg 95.3 kg 94.666 kg      Telemetry    NSR/sinus tach - Personally Reviewed  ECG    No new tracing - Personally Reviewed  Physical Exam   GEN: Disheveled  Neck: No JVD Cardiac: RRR, 2/6 systolic murmur Respiratory: Clear to auscultation bilaterally. GI: Soft, nontender, non-distended  MS: Left BKA, no edema Neuro:  Nonfocal  Psych: Normal affect   Labs    High Sensitivity Troponin:   Recent Labs  Lab 06/15/22 0209 06/15/22 0413 06/15/22 1135 06/15/22 1522 06/25/22 1417  TROPONINIHS 1,420* 2,596* 6,096* 5,251* 7,765*     Chemistry Recent Labs  Lab 06/25/22 1417 06/25/22 1706 06/26/22 0403  NA 139 135 132*  K 5.0 5.2* 4.9  CL  109  --  108  CO2 16*  --  15*  GLUCOSE 86  --  171*  BUN 47*  --  41*  CREATININE 1.45*  --  1.25*  CALCIUM 8.7*  --  7.9*  PROT 6.1*  --  5.0*  ALBUMIN 2.7*  --  2.2*  AST 67*  --  46*  ALT 32  --  26  ALKPHOS 61  --  47  BILITOT 1.0  --  0.8  GFRNONAA 53*  --  >60  ANIONGAP 14  --  9    Lipids No results for input(s): "CHOL", "TRIG", "HDL", "LABVLDL", "LDLCALC", "CHOLHDL" in the last 168 hours.  Hematology Recent Labs  Lab 06/25/22 1230 06/25/22 1257 06/25/22 1706 06/26/22 0403  WBC 16.5*  --   --  8.6  RBC 4.29  --   --  3.54*  HGB 14.1 14.6 12.6* 11.5*  HCT 42.3 43.0 37.0* 35.9*  MCV 98.6  --   --  101.4*  MCH 32.9  --   --  32.5  MCHC 33.3  --   --  32.0  RDW 14.6  --   --  14.5  PLT 267  --   --  134*   Thyroid No results for input(s): "TSH", "FREET4" in the last 168 hours.  BNPNo results for input(s): "BNP", "PROBNP" in the last 168 hours.  DDimer No results for input(s): "DDIMER" in the last 168 hours.   Radiology    DG Chest Port 1 View  Result Date: 06/25/2022 CLINICAL DATA:  Chest pain EXAM: PORTABLE CHEST 1 VIEW COMPARISON:  06/15/2022 FINDINGS: Cardiac size is within normal limits. Pulmonary vascularity is unremarkable. New patchy infiltrates are seen in right mid and right lower lung fields. Rest of the lung fields are unremarkable. There is no pleural effusion or pneumothorax. IMPRESSION: New infiltrate is seen in right parahilar region and right lower lung fields suggesting pneumonia. Electronically Signed   By: Elmer Picker M.D.   On: 06/25/2022 14:49   CT Head Wo Contrast  Result Date: 06/25/2022 CLINICAL DATA:  Provided history: Headache, new or worsening. Additional history provided: Increased confusion, found on floor, history of prostate cancer. EXAM: CT HEAD WITHOUT CONTRAST TECHNIQUE: Contiguous axial images were obtained from the base of the skull through the vertex without intravenous contrast. RADIATION DOSE REDUCTION: This exam was  performed according to the departmental dose-optimization program which includes automated exposure control, adjustment of the mA and/or kV according to patient size and/or use of iterative reconstruction technique. COMPARISON:  Head CT 06/15/2022.  Brain MRI 07/28/2021. FINDINGS: Brain: Mild generalized parenchymal atrophy. Redemonstrated small chronic cortical/subcortical infarcts within the lateral right parietal and temporal lobes. Background mild patchy and ill-defined hypoattenuation within the cerebral white matter, nonspecific but compatible with chronic small vessel ischemic disease. Chronic infarcts within the right corona radiata, right basal ganglia and bilateral cerebellar hemispheres, some of which were better appreciated on the prior brain MRI of 07/28/2021. There is no acute intracranial hemorrhage. No acute demarcated cortical infarct. No extra-axial fluid collection. No evidence of an intracranial mass. No midline shift. Vascular: No hyperdense vessel.  Atherosclerotic calcifications. Skull: No fracture or aggressive osseous lesion. Sinuses/Orbits: No mass or acute finding within the imaged orbits. Tiny mucous retention cyst within the right maxillary sinus. Minimal mucosal thickening within the inferior left maxillary sinus, within the bilateral ethmoid air cells and within the left frontoethmoidal recess. IMPRESSION: No evidence of acute intracranial abnormality. 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 Mild paranasal sinus disease, as described. Electronically Signed   By: Kellie Simmering D.O.   On: 06/25/2022 14:18    Cardiac Studies   Echocardiogram from 06/15/2022:   1. Left ventricular ejection fraction, by estimation, is 40 to 45%. The  left ventricle has mildly decreased function. The left ventricle  demonstrates regional wall motion abnormalities (see scoring   diagram/findings for description). There is mild left  ventricular hypertrophy. Left ventricular diastolic function could not be  evaluated. There is moderate hypokinesis of the left ventricular,  basal-mid inferior wall and inferolateral wall.   2. Right ventricular systolic function is mildly reduced. The right  ventricular size is normal.   3. Left atrial size was severely dilated.   4. The mitral valve is abnormal. Moderate mitral valve regurgitation.   5. The aortic valve is tricuspid. Aortic valve regurgitation is not  visualized.   6. Aortic dilatation noted. There is moderate dilatation of the ascending  aorta, measuring 47 mm. There is mild dilatation of the aortic root,  measuring 42 mm.   7.  The inferior vena cava is normal in size with greater than 50%  respiratory variability, suggesting right atrial pressure of 3 mmHg.   Comparison(s): Changes from prior study are noted. 02/11/2022 [facility]):  LVEF 35-40%, global hypokinesis worse laterally. \\   Left heart catheterization from 06/17/2022:   Conclusions: Severe multivessel coronary artery disease with severe calcification, as detailed below, including 50% mid LAD stenosis, 95% stenosis of dominant branch of large D1 and chronic total occlusion of lateral branch of D1, chronic total occlusion of LCx, and chronic total occlusion of ostial through mid RCA (including stent placed in 01/2022) with left-to-right collaterals. Normal left ventricular filling pressure (LVEDP 10 mmHg).   Recommendations: Consider viability study with particular attention to the inferior wall.  If RCA territory is viable, cardiac surgery consultation for CABG should be considered.  If RCA territory is not viable (or the patient is not a candidate for CABG), medical therapy versus PCI to dominant branch of D1 (particularly if the patient reports recurrent angina) should be considered. Hold ticagrelor pending further workup and possible cardiac surgery  consultation.  If the patient does not undergo CABG, I would favor 12 months of clopidogrel + DOAC therapy in the setting of new atrial fibrillation and NSTEMI. Aggressive secondary prevention of coronary artery disease.    Patient Profile     66 y.o. 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 Cardiology was consulted due to trop elevation 7765.   Assessment & Plan    #NSTEMI: Patient with known severe multivessel CAD on medical management as he is not a candidate for CABG and disease is not amenable to PCI. Presented on this admission after being found down for unknown duration with initial BG 30s. Trop in that setting 7765. Suspect this is likely demand in the setting of known severe multivessel disease and profound hypoglycemia. ECG nonischemic. Unfortunately, based on cath 05/2022, his disease is not amenable for PCI and we will continue with medical therapy at this time.  -Resume plavix today given recent PCI in 01/2022 and he needs some form of antiplatelet agent as suspect he has been off of it for several days -Unable to tolerate BB or imdur due to hypotension -Continue lipitor '80mg'$  daily  #Chronic Systolic HF: #Ischemic CM: LVEF 40-45% on TTE 05/2022. On presentation, he was volume down and hypotensive after being found down for unknown duration. Unable to tolerate GDMT due to hypotension. Will add as able. -Unable to tolerate GDMT due to hypotension -Monitor volume status closely after volume resuscitation; appears euvolemic  -Monitor I/Os and daily weights  #Paroxysmal Afib: Diagnosed during recent admission in 05/2022. Was started on apixaban for [facility] for CHADs-vasc 7. Was on metop for rate control which has been held due to hypotension. Currently in sinus on the  monitor. -Apixaban being held due to hematuria; resume as able -Hold metop due to hypotension  #DMII with profound hypoglycemia: Patient presented after being found down, altered for unknown duration with BG 30s. Now on D10 1/2NS. -Management per primary  #Possible HCAP/Sepsis: ? Aspiration pneumonia. Procal normal today. Management per primary. -Management per primary  #Gross Hematuria: Noted on admission. HgB downtrending but this is after significant IVF and all cell lines are down. Given recent PCI, will restart plavix as  this time. Can continue to hold apixaban for now.   #AKI: Improved with IVF. Continue to monitor  #Moderate MR: -Serial monitoring as outpatient  #Moderate ascending aortic aneurysm: -Serial monitoring -Continue lipitor as above       For questions or updates, please contact [place] Please consult [email or web address] for contact info under        Signed, Freada Bergeron, MD  06/26/2022, 7:58 AM

## 2022-06-26 NOTE — Progress Notes (Signed)
  Transition of Care [facility]) Screening Note   Patient Details  Name: Peter Yang Date of Birth: [birth year]   Transition of Care [facility]) CM/SW Contact:    Cyndi Bender, RN Phone Number: 06/26/2022, 9:08 AM    Transition of Care Department [facility]) has reviewed patient and no TOC needs have been identified at this time. We will continue to monitor patient advancement through interdisciplinary progression rounds. If new patient transition needs arise, please place a TOC consult.

## 2022-06-26 NOTE — Progress Notes (Signed)
PROGRESS NOTE    Peter Yang  DGU:[id number] DOB: 10-15-1956 DOA: 06/25/2022 PCP: Clovia Cuff, MD    Brief Narrative:  Peter Yang is a 66 y.o. male with past medical history of benign prostatic hypertrophy, diabetes mellitus type 2, hypertension, peripheral vascular disease status post left BKA, coronary artery disease status post stent, hyperlipidemia, history of TIA, obstructive sleep apnea who was recently discharged from the hospital on 06/18/2022 after being treated for non-ST elevation MI and hyperglycemia.  He did have left heart cath on 06/17/2022 and medical management was advised.  He also had new onset atrial fibrillation and was discharged on Eliquis.  This time patient was noted to be very hypoglycemic and confused at home when checked in by his friend.  He was borderline hypotensive with elevated WBC at 16.5.  Lactate was 2.4.[name] fecal occult was positive.  CT head did not show any acute findings.  Chest x-ray showed a new infiltrate in the right perihilar region suggestive pneumonia.  In the ED, sepsis protocol was initiated and patient was continued on D5 half-normal saline.  Broad-spectrum antibiotics were given.  Cardiology was consulted and patient was admitted hospital for further evaluation and treatment.   Assessment and Plan:  Principal Problem:   Type 2 diabetes mellitus with hypoglycemia (HCC) Active Problems:   sepsis secondary to HCAP and possible aspiration    CAD s/p NSTEMI and LHC on 06/17/22 with NSTEMI   Acute metabolic encephalopathy   Hematuria and +fecal occult    AKI (acute kidney injury) ([place])   New onset atrial fibrillation (HCC)   Essential hypertension   Chronic systolic CHF (congestive heart failure) (HCC)   Pressure ulcer of sacral region, stage 2 (HCC)   Dyslipidemia   * Type 2 diabetes mellitus with hypoglycemia (HCC) Received dextrose with half-normal saline.  Was taking Tresiba 40 units including Jardiance and metformin at home.  Continue to  hold all hypoglycemics at this time.  Will likely need to be adjusted on regimen prior to discharge.  Reassess volume status in AM.  sepsis secondary to HCAP and possible aspiration  X-ray showed right perihilar infiltrates.  Patient had leukocytosis tachycardia elevated lactate.  MRSA PCR negative.  Blood cultures negative in less than 24 hours.  Procalcitonin elevated at 2.9.  Still received IV fluid bolus.  Still on IV fluids.  Continue for 12 hours and reassess in AM.  Continue incentive spirometry and flutter valve and supportive care.  CAD s/p NSTEMI and LHC on 06/17/22 with NSTEMI Patient was recently admitted on 8/26 for exertional chest pain and dyspnea found to have NSTEMI.  Patient status postcardiac cath on 06/17/2022 and recommended medical management for severe multivessel CAD with severe calcification.  Patient does not have good targets for PCI and not a candidate for CABG.  Troponin this admission significantly elevated at  [id number] cardiology was consulted.  Continue statins.  Currently Eliquis, aspirin, beta-blocker on hold due to hypotension and concerns for low hemoglobin-hematuria fecal occult positive.  Acute metabolic encephalopathy Likely secondary to sepsis hypoglycemia hypokalemia and possible pneumonia.  Possibility of aspiration.  Treat underlying causes.  Hematuria and +fecal occult  Frank blood in urine and +fecal occult on presentation.  Urine bag today mildly dark.  Has been started back on Plavix.  Continue to hold Eliquis.  Had endorsed 1 episode of dark stool as outpatient.  Previous provider had spoken with GI and no need for consultation in the setting of clinical picture.  If patient continues to have  drop in hemoglobin we will consult GI.  Continue Protonix 40 twice daily.  Hemoglobin today at 11.5.  AKI (acute kidney injury) ([place]) Creatinine today at 1.2.  Likely secondary to hypotension/sepsis.  Continue to monitor closely.  Intake and output charting.   Received IV fluids.  New onset atrial fibrillation (HCC) Beta-blocker on hold due to hypotension.  Eliquis on hold due to difficult blood positive and hematuria on presentation.  Essential hypertension Patient was hypotensive on presentation.  Patient states that he is always on the lower side.  Continue to jardiance, lisinopril and metoprolol for now.  Chronic systolic CHF (congestive heart failure) (HCC) Volume depleted on presentation.  Received IV fluid bolus and D5 half-normal saline.  Currently on D5 half-normal saline.  2D echocardiogram from 06/17/2022 showed LV ejection fraction of 40 to 45% with indeterminate diastolic function.  ACE inhibitor's Toprol Jardiance on hold at this time.  Pressure ulcer of sacral region, stage 2 ([place]) Present on admission.  Continue wound care.  Dyslipidemia Continue Lipitor  Concern for fall, debility weakness.  We will get PT evaluation.    DVT prophylaxis: SCDs Start: 06/25/22 1558   Code Status:     Code Status: DNR  Disposition: Uncertain at this time, recent admission from home, might need rehabilitation.  Status is: Observation  The patient will require care spanning > 2 midnights and should be moved to inpatient because: Non-ST elevation MI, infection, antibiotic   Family Communication: None at bedside  Consultants:  Cardiology  Procedures:  None  Antimicrobials:  Vancomycin and cefepime.  Anti-infectives (From admission, onward)    Start     Dose/Rate Route Frequency Ordered Stop   06/26/22 1500  vancomycin (VANCOREADY) IVPB 2000 mg/400 mL        2,000 mg 200 mL/hr over 120 Minutes Intravenous Every 24 hours 06/25/22 1932 07/02/22 1459   06/25/22 2200  ceFEPIme (MAXIPIME) 2 g in sodium chloride 0.9 % 100 mL IVPB        2 g 200 mL/hr over 30 Minutes Intravenous Every 8 hours 06/25/22 1933 07/02/22 2159   06/25/22 1430  ceFEPIme (MAXIPIME) 2 g in sodium chloride 0.9 % 100 mL IVPB  Status:  Discontinued        2 g 200  mL/hr over 30 Minutes Intravenous Every 8 hours 06/25/22 1421 06/25/22 1933   06/25/22 1430  vancomycin (VANCOREADY) IVPB 2000 mg/400 mL        2,000 mg 200 mL/hr over 120 Minutes Intravenous  Once 06/25/22 1421 06/25/22 1726   06/25/22 1415  ceFEPIme (MAXIPIME) 2 g in sodium chloride 0.9 % 100 mL IVPB  Status:  Discontinued        2 g 200 mL/hr over 30 Minutes Intravenous  Once 06/25/22 1411 06/25/22 1421   06/25/22 1415  metroNIDAZOLE (FLAGYL) IVPB 500 mg        500 mg 100 mL/hr over 60 Minutes Intravenous  Once 06/25/22 1411 06/25/22 2034   06/25/22 1415  vancomycin (VANCOCIN) IVPB 1000 mg/200 mL premix  Status:  Discontinued        1,000 mg 200 mL/hr over 60 Minutes Intravenous  Once 06/25/22 1411 06/25/22 1421      Subjective: Today, patient was seen and examined at bedside.  Patient states that he feels okay but has some cough.  Denies any chest pain or shortness of breath.  Has not had a bowel movement in last 2 days.  He did eat some.  Nursing staff reported some hematuria which  is not that prominent.  Objective: Vitals:   06/26/22 0719 06/26/22 0800 06/26/22 0909 06/26/22 1235  BP: (!) 83/61 (!) 84/57 91/62 (!) 83/59  Pulse: 94 95 97 89  Resp: '18 18 19 18  '$ Temp: 98.6 F (37 C)  98.4 F (36.9 C) 98 F (36.7 C)  TempSrc: Oral  Oral Axillary  SpO2: 92% 93% 92% 95%  Weight:      Height:        Intake/Output Summary (Last 24 hours) at 06/26/2022 1353 Last data filed at 06/26/2022 5009 Gross per 24 hour  Intake 693.4 ml  Output 800 ml  Net -106.6 ml   Filed Weights   06/25/22 1219  Weight: 94.3 kg    Physical Examination: Body mass index is 25.32 kg/m.  General:  Average built, not in obvious distress, slow to respond, HENT:   No scleral pallor or icterus noted. Oral mucosa is moist.  Chest: .  Diminished breath sounds bilaterally. No crackles or wheezes.  CVS: S1 &S2 heard. No murmur.  Regular rate and rhythm. Abdomen: Soft, nontender, nondistended.  Bowel sounds  are heard.   Extremities: Left below-knee amputation, right leg with some dressing. Psych: Alert, awake and Communicative. CNS:  No cranial nerve deficits.  Moves extremities. Skin: Warm and dry.  Left below-knee amputation, right leg with mild edema in dressing.  Data Reviewed:   CBC: Recent Labs  Lab 06/25/22 1230 06/25/22 1257 06/25/22 1706 06/26/22 0403  WBC 16.5*  --   --  8.6  NEUTROABS 15.1*  --   --   --   HGB 14.1 14.6 12.6* 11.5*  HCT 42.3 43.0 37.0* 35.9*  MCV 98.6  --   --  101.4*  PLT 267  --   --  134*    Basic Metabolic Panel: Recent Labs  Lab 06/25/22 1257 06/25/22 1417 06/25/22 1706 06/26/22 0403  NA 136 139 135 132*  K 4.5 5.0 5.2* 4.9  CL  --  109  --  108  CO2  --  16*  --  15*  GLUCOSE  --  86  --  171*  BUN  --  47*  --  41*  CREATININE  --  1.45*  --  1.25*  CALCIUM  --  8.7*  --  7.9*    Liver Function Tests: Recent Labs  Lab 06/25/22 1417 06/26/22 0403  AST 67* 46*  ALT 32 26  ALKPHOS 61 47  BILITOT 1.0 0.8  PROT 6.1* 5.0*  ALBUMIN 2.7* 2.2*     Radiology Studies: DG Chest Port 1 View  Result Date: 06/25/2022 CLINICAL DATA:  Chest pain EXAM: PORTABLE CHEST 1 VIEW COMPARISON:  06/15/2022 FINDINGS: Cardiac size is within normal limits. Pulmonary vascularity is unremarkable. New patchy infiltrates are seen in right mid and right lower lung fields. Rest of the lung fields are unremarkable. There is no pleural effusion or pneumothorax. IMPRESSION: New infiltrate is seen in right parahilar region and right lower lung fields suggesting pneumonia. Electronically Signed   By: Elmer Picker M.D.   On: 06/25/2022 14:49   CT Head Wo Contrast  Result Date: 06/25/2022 CLINICAL DATA:  Provided history: Headache, new or worsening. Additional history provided: Increased confusion, found on floor, history of prostate cancer. EXAM: CT HEAD WITHOUT CONTRAST TECHNIQUE: Contiguous axial images were obtained from the base of the skull through the  vertex without intravenous contrast. RADIATION DOSE REDUCTION: This exam was performed according to the departmental dose-optimization program which includes automated exposure  control, adjustment of the mA and/or kV according to patient size and/or use of iterative reconstruction technique. COMPARISON:  Head CT 06/15/2022.  Brain MRI 07/28/2021. FINDINGS: Brain: Mild generalized parenchymal atrophy. Redemonstrated small chronic cortical/subcortical infarcts within the lateral right parietal and temporal lobes. Background mild patchy and ill-defined hypoattenuation within the cerebral white matter, nonspecific but compatible with chronic small vessel ischemic disease. Chronic infarcts within the right corona radiata, right basal ganglia and bilateral cerebellar hemispheres, some of which were better appreciated on the prior brain MRI of 07/28/2021. There is no acute intracranial hemorrhage. No acute demarcated cortical infarct. No extra-axial fluid collection. No evidence of an intracranial mass. No midline shift. Vascular: No hyperdense vessel.  Atherosclerotic calcifications. Skull: No fracture or aggressive osseous lesion. Sinuses/Orbits: No mass or acute finding within the imaged orbits. Tiny mucous retention cyst within the right maxillary sinus. Minimal mucosal thickening within the inferior left maxillary sinus, within the bilateral ethmoid air cells and within the left frontoethmoidal recess. IMPRESSION: No evidence of acute intracranial abnormality. 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 Mild paranasal sinus disease, as described. Electronically Signed   By: Kellie Simmering D.O.   On: 06/25/2022 14:18      LOS: 0 days    Flora Lipps, MD [facility] Available via Epic secure chat 7am-7pm After these hours, please refer to coverage provider listed on [email or web address] 06/26/2022, 1:53 PM

## 2022-06-26 NOTE — Evaluation (Signed)
Physical Therapy Evaluation Patient Details Name: Peter Yang MRN: [id number] DOB: Oct 10, 1956 Today's Date: 06/26/2022  History of Present Illness  Pt is a 66 y/o male admitted secondary to hypoglycemia. Recent admission for NSTEMI. PMH includes DM, HTN, L BKA, and CAD.  Clinical Impression  Pt admitted secondary to problem above with deficits below. Pt requiring min A to perform bed mobility tasks. Pt with increased fatigue and soft BP so further mobility deferred. Pt reports the plan is to go home and resume [place] services. Will need to ensure safety with mobility progression prior to return home. Will continue to follow acutely.    06/26/22 1523  Orthostatic Lying   BP- Lying (!) 87/60  Pulse- Lying 91  Orthostatic Sitting  BP- Sitting (!) 85/63  Pulse- Sitting 97         Recommendations for follow up therapy are one component of a multi-disciplinary discharge planning process, led by the attending physician.  Recommendations may be updated based on patient status, additional functional criteria and insurance authorization.  Follow Up Recommendations Home health PT (pending progression)      Assistance Recommended at Discharge Intermittent Supervision/Assistance  Patient can return home with the following  A lot of help with bathing/dressing/bathroom;Assistance with cooking/housework;Assist for transportation;Help with stairs or ramp for entrance    Equipment Recommendations None recommended by PT  Recommendations for Other Services       Functional Status Assessment Patient has had a recent decline in their functional status and demonstrates the ability to make significant improvements in function in a reasonable and predictable amount of time.     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 Increased time required. Pt reporting he did not feel good and BP soft, so further mobility deferred.    Transfers                        Ambulation/Gait                  Stairs            Wheelchair Mobility    Modified Rankin (Stroke Patients Only)       Balance Overall balance assessment: Needs assistance Sitting-balance support: No upper extremity supported, Feet supported Sitting balance-Leahy Scale: Fair                                       Pertinent Vitals/Pain Pain Assessment Pain Assessment: No/denies pain    Home Living Family/patient expects to be discharged to:: Private residence Living Arrangements: Alone Available Help at Discharge: Neighbor;Available PRN/intermittently Type of Home: House Home Access: Ramped entrance       Home Layout: One level Home Equipment: Conservation officer, nature (2 wheels);Cane - single point;BSC/3in1;Wheelchair - manual Additional Comments: Pt works at [employer] Prior Level of Function : Independent/Modified Independent             Mobility Comments: Uses WC at home, but able to transfer  independently ADLs Comments: Independent with ADLs.     Hand Dominance        Extremity/Trunk Assessment   Upper Extremity Assessment Upper Extremity Assessment: Defer to OT evaluation    Lower Extremity Assessment Lower Extremity Assessment: LLE deficits/detail LLE Deficits / Details: L BKA    Cervical / Trunk Assessment Cervical / Trunk Assessment: Normal  Communication   Communication: No difficulties  Cognition Arousal/Alertness: Awake/alert Behavior During Therapy: Flat affect Overall Cognitive Status: No family/caregiver present to determine baseline  cognitive functioning                                 General Comments: Slow processing noted. Increased time to follow commands.        General Comments      Exercises     Assessment/Plan    PT Assessment Patient needs continued PT services  PT Problem List Decreased strength;Decreased activity tolerance;Decreased balance;Decreased mobility;Decreased cognition       PT Treatment Interventions DME instruction;Gait training;Functional mobility training;Therapeutic activities;Therapeutic exercise;Balance training;Neuromuscular re-education;Patient/family education    PT Goals (Current goals can be found in the Care Plan section)  Acute Rehab PT Goals Patient Stated Goal: to go home PT Goal Formulation: With patient Time For Goal Achievement: 07/10/22 Potential to Achieve Goals: Good    Frequency Min 3X/week     Co-evaluation               AM-PAC PT "6 Clicks" Mobility  Outcome Measure Help needed turning from your back to your side while in a flat bed without using bedrails?: None Help needed moving from lying on your back to sitting on the side of a flat bed without using bedrails?: A Little Help needed moving to and from a bed to a chair (including a wheelchair)?: A Little Help needed standing up from a chair using your arms (e.g., wheelchair or bedside chair)?: A Little Help needed to walk in hospital room?: A Lot Help needed climbing 3-5 steps with a railing? : Total 6 Click Score: 16    End of Session   Activity Tolerance: Patient limited by fatigue Patient left: in bed;with call bell/phone within reach;with bed alarm set Nurse Communication: Mobility status PT Visit Diagnosis: Other abnormalities of gait and mobility (R26.89);Muscle weakness (generalized) (M62.81)    Time: 9169-4503 PT Time Calculation (min) (ACUTE ONLY): 12 min   Charges:   PT Evaluation $PT Eval Moderate Complexity: 1 Mod          Reuel Derby, PT, DPT  Acute  Rehabilitation Services  Office: [phone]   Rudean Hitt 06/26/2022, 4:13 PM

## 2022-06-26 NOTE — Consult Note (Signed)
[facility] Nurse Consult Note: Reason for Consult:Patient with stage 3 pressure injury to sacrum.  Right lower extremity full thickness wounds to anterior lower leg.  R BKA noted.  Wound type:pressure to sacrum, trauma to right anterior lower leg Pressure Injury POA: Yes Measurement: Sacrum is improved from last hospitalization (06/15/22) where this is noted to be pressure and moisture breakdown.  Erythema is 2.8 cm x 3 cm with 2 openings in the center, 0.3 cm round with thin yellow fibrin present.  Right lower extremity with 2 openings proximal 1 cm x 0.5 cm scabbed lesion Distal: 0.5 cm round scabbed lesion Wound bed:see above Drainage (amount, consistency, odor) minimal serosanguinous  no odor. Periwound: blanchable erythema to sacrum and bilateral gluteal folds.  Faint. Improving since last admission photos Sacral foam to nonintact wounds.  Change every three days and PRN soilage.  Barrier cream to buttocks each shift and PRN soilage.  Silicone 3 cm x 3 cm foam dressings to wounds on anterior lower leg. Change every three days and PRN soilage.  Will not follow at this time.  Please re-consult if needed.  Domenic Moras MSN, RN, FNP-BC CWON Wound, Ostomy, Continence Nurse Pager [phone]

## 2022-06-27 DIAGNOSIS — I214 Non-ST elevation (NSTEMI) myocardial infarction: Secondary | ICD-10-CM

## 2022-06-27 DIAGNOSIS — I4891 Unspecified atrial fibrillation: Secondary | ICD-10-CM | POA: Diagnosis not present

## 2022-06-27 DIAGNOSIS — E785 Hyperlipidemia, unspecified: Secondary | ICD-10-CM | POA: Diagnosis not present

## 2022-06-27 DIAGNOSIS — I10 Essential (primary) hypertension: Secondary | ICD-10-CM | POA: Diagnosis not present

## 2022-06-27 DIAGNOSIS — I25110 Atherosclerotic heart disease of native coronary artery with unstable angina pectoris: Secondary | ICD-10-CM | POA: Diagnosis not present

## 2022-06-27 LAB — COMPREHENSIVE METABOLIC PANEL
ALT: 31 U/L (ref 0–44)
AST: 42 U/L — ABNORMAL HIGH (ref 15–41)
Albumin: 1.9 g/dL — ABNORMAL LOW (ref 3.5–5.0)
Alkaline Phosphatase: 50 U/L (ref 38–126)
Anion gap: 8 (ref 5–15)
BUN: 34 mg/dL — ABNORMAL HIGH (ref 8–23)
CO2: 15 mmol/L — ABNORMAL LOW (ref 22–32)
Calcium: 7.7 mg/dL — ABNORMAL LOW (ref 8.9–10.3)
Chloride: 104 mmol/L (ref 98–111)
Creatinine, Ser: 1.24 mg/dL (ref 0.61–1.24)
GFR, Estimated: >60 mL/min (ref >60)
Glucose, Bld: 251 mg/dL — ABNORMAL HIGH (ref 70–99)
Potassium: 4 mmol/L (ref 3.5–5.1)
Sodium: 127 mmol/L — ABNORMAL LOW (ref 135–145)
Total Bilirubin: 0.8 mg/dL (ref 0.3–1.2)
Total Protein: 5.2 g/dL — ABNORMAL LOW (ref 6.5–8.1)

## 2022-06-27 LAB — URINE CULTURE: Culture: NO GROWTH

## 2022-06-27 LAB — LEGIONELLA PNEUMOPHILA SEROGP 1 UR AG: L. pneumophila Serogp 1 Ur Ag: NEGATIVE

## 2022-06-27 LAB — CBC
HCT: 30.8 % — ABNORMAL LOW (ref 39.0–52.0)
Hemoglobin: 10.4 g/dL — ABNORMAL LOW (ref 13.0–17.0)
MCH: 32.3 pg (ref 26.0–34.0)
MCHC: 33.8 g/dL (ref 30.0–36.0)
MCV: 95.7 fL (ref 80.0–100.0)
Platelets: 124 10*3/uL — ABNORMAL LOW (ref 150–400)
RBC: 3.22 MIL/uL — ABNORMAL LOW (ref 4.22–5.81)
RDW: 14.4 % (ref 11.5–15.5)
WBC: 5 10*3/uL (ref 4.0–10.5)
nRBC: 0 % (ref 0.0–0.2)

## 2022-06-27 LAB — PROCALCITONIN: Procalcitonin: 1.33 ng/mL

## 2022-06-27 LAB — GLUCOSE, CAPILLARY
Glucose-Capillary: 200 mg/dL — ABNORMAL HIGH (ref 70–99)
Glucose-Capillary: 262 mg/dL — ABNORMAL HIGH (ref 70–99)
Glucose-Capillary: 259 mg/dL — ABNORMAL HIGH (ref 70–99)

## 2022-06-27 LAB — MAGNESIUM: Magnesium: 2 mg/dL (ref 1.7–2.4)

## 2022-06-27 MED ORDER — SODIUM CHLORIDE 0.9 % IV SOLN: INTRAVENOUS | Status: DC

## 2022-06-27 MED ORDER — SODIUM CHLORIDE 0.9 % IV BOLUS
250.0000 mL | Freq: Once | INTRAVENOUS | Status: AC
  Administered 2022-06-27: 250 mL via INTRAVENOUS

## 2022-06-27 MED ORDER — INSULIN ASPART 100 UNIT/ML IJ SOLN
0.0000 [IU] | Freq: Three times a day (TID) | INTRAMUSCULAR | Status: DC
  Administered 2022-06-27 – 2022-06-28 (×2): 3 [IU] via SUBCUTANEOUS
  Administered 2022-06-28 – 2022-06-29 (×3): 1 [IU] via SUBCUTANEOUS
  Administered 2022-06-29 – 2022-06-30 (×4): 2 [IU] via SUBCUTANEOUS

## 2022-06-27 NOTE — Progress Notes (Signed)
Patient blood pressure running low 0250 Blood pressure 81/59.Notified DO.No new order.RN will continue to monitor

## 2022-06-27 NOTE — TOC Initial Note (Signed)
Transition of Care [facility]) - Initial/Assessment Note    Patient Details  Name: Peter Yang MRN: [id number] Date of Birth: [birth year]  [facility] CM/SW Contact:    Cyndi Bender, RN Phone Number: 06/27/2022, 4:06 PM  Clinical Narrative:                 Spoke to patient at bedside regarding transition needs. Patient wants to go home with home health at discharge. Patient is active with [facility] PT,OT,RN. HH-RN comes out twice a week to change sacrum dressing. Has all needed DME, walker, wheelchair, and cane.  Patient either drives or has his neighbor transport home.  Patient states he will need transportation home at discharge. Address, Phone number and PCP verified.   TOC will continue to follow for needs.  Will need resumption orders for Home health, PT, OT, RN  Expected Discharge Plan: [place] Barriers to Discharge: Continued Medical Work up   Patient Goals and CMS Choice Patient states their goals for this hospitalization and ongoing recovery are:: return home with [facility] CMS Medicare.gov Compare Post Acute Care list provided to:: Patient Choice offered to / list presented to : Patient  Expected Discharge Plan and Services Expected Discharge Plan: [place]   Discharge Planning Services: CM Consult Post Acute Care Choice: [place] arrangements for the past 2 months: Single Family Home                           HH Arranged: PT, RN, OT [facility] Agency: [facility]        Prior Living Arrangements/Services Living arrangements for the past 2 months: Single Family Home Lives with:: Self Patient language and need for interpreter reviewed:: Yes        Need for Family Participation in Patient Care: Yes (Comment) Care giver support system in place?: Yes (comment) Current home services: DME, Home PT, Home RN, Home OT (With [facility]) Criminal Activity/Legal Involvement Pertinent to Current  Situation/Hospitalization: No - Comment as needed  Activities of Daily Living      Permission Sought/Granted                  Emotional Assessment Appearance:: Appears stated age Attitude/Demeanor/Rapport: Engaged Affect (typically observed): Accepting Orientation: : Oriented to Self, Oriented to Place, Oriented to  Time, Oriented to Situation Alcohol / Substance Use: Not Applicable Psych Involvement: No (comment)  Admission diagnosis:  Hypoglycemia [E16.2] Type 2 diabetes mellitus with hypoglycemia ([place]) [E11.649] Altered mental status, unspecified altered mental status type [R41.82] Sepsis ([place]) [A41.9] Patient Active Problem List   Diagnosis Date Noted   CAD s/p NSTEMI and LHC on 06/17/22 with NSTEMI 06/25/2022   Type 2 diabetes mellitus with hypoglycemia ([place]) 06/25/2022   Hematuria and +fecal occult  06/25/2022   NSTEMI (non-ST elevated myocardial infarction) ([place]) 06/15/2022   AKI (acute kidney injury) ([place]) 06/15/2022   New onset atrial fibrillation ([place]) 06/15/2022   Dyslipidemia 06/15/2022   PVD (peripheral vascular disease) ([place]) 06/15/2022   Pressure ulcer of sacral region, stage 2 ([place]) 40/98/1191   Chronic systolic CHF (congestive heart failure) ([place]) 06/15/2022   DNR (do not resuscitate) 06/15/2022   Subarachnoid hemorrhage ([place]) 07/28/2021   Mixed diabetic hyperlipidemia associated with type 2 diabetes mellitus ([place]) 07/28/2021   Diabetic polyneuropathy associated with type 2 diabetes mellitus ([place]) 07/28/2021   sepsis secondary to HCAP and possible aspiration  07/28/2021   Acute cystitis  without hematuria 07/28/2021   Prolonged QT interval 07/28/2021   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 70/48/8891   Acute metabolic encephalopathy 69/45/0388   Controlled type 2 diabetes mellitus with hyperglycemia, with long-term current use of insulin ([place]) 06/28/2021   BPH with obstruction/lower urinary tract symptoms  02/02/2018   Uncontrolled type 2 diabetes mellitus with hypoglycemia without coma, with long-term current use of insulin ([place]) 12/13/2016   Essential hypertension 01/25/2016   PCP:  Clovia Cuff, MD Pharmacy:   [facility] [phone] - [place], [place] - [address] [place] [address] Phone: [phone] Fax: [phone]  [facility] [address] [address] Phone: [phone] Fax: [phone]  [id number] [place] - [address] [place]  [id number]: [phone]: [phone]    Social Determinants of Health (SDOH) Interventions    Readmission Risk Interventions     No data to display

## 2022-06-27 NOTE — Progress Notes (Signed)
Physical Therapy Treatment Patient Details Name: Peter Yang MRN: [id number] DOB: [birth year] Today's Date: 06/27/2022   History of Present Illness Pt is a 66 y/o male admitted secondary to hypoglycemia. Recent admission for NSTEMI. PMH includes DM, HTN, L BKA, and CAD.    PT Comments    Pt received in supine, agreeable to therapy session with emphasis on transfer training, pt with good participation but limited progress with mobility due to slow processing and c/o weakness/fatigue. Pt needing heavy +2 modA for sit<>stand and pivotal transfers to/from elevated bed<>recliner. Pt unable to manage RW on his own and needed staff assist to stabilize/move RW while he pivoted. Pt BP soft initially but improved with positional changes this date, see comments below. Geomat cushion ordered but hadn't arrived to room by end of session. Disposition discussed with pt, who is eager to progress to DC home but has poor insight into his level of deconditioning after multiple recent admissions. Pt may benefit from further cognitive work-up due to decreased safety awareness and insight. Disposition updated below after discussion with supervising PT [place].   Recommendations for follow up therapy are one component of a multi-disciplinary discharge planning process, led by the attending physician.  Recommendations may be updated based on patient status, additional functional criteria and insurance authorization.  Follow Up Recommendations  Skilled nursing-short term rehab (<3 hours/day) (pt likely to refuse but not yet able to mobilize OOB without +2 assist and lives alone) Can patient physically be transported by private vehicle: No   Assistance Recommended at Discharge Intermittent Supervision/Assistance  Patient can return home with the following A lot of help with bathing/dressing/bathroom;Assistance with cooking/housework;Assist for transportation;Help with stairs or ramp for entrance;Two people to help with  walking and/or transfers   Equipment Recommendations  None recommended by PT (TBD)    Recommendations for Other Services       Precautions / Restrictions Precautions Precautions: Fall Precaution Comments: L BKA, 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 and does not currently fit well since swelling went down, monitor BP Restrictions Weight Bearing Restrictions: No     Mobility  Bed Mobility Overal bed mobility: Needs Assistance Bed Mobility: Rolling, Sit to Supine, Supine to Sit Rolling: Supervision   Supine to sit: Min assist Sit to supine: Min guard   General bed mobility comments: rolled for pericare with rail and increased time, min assist for trunk with supine to sit, no physical assist to return to supine    Transfers Overall transfer level: Needs assistance Equipment used: Rolling walker (2 wheels) Transfers: Sit to/from Stand, Bed to chair/wheelchair/BSC Sit to Stand: +2 physical assistance, Mod assist, From elevated surface Stand pivot transfers: Min assist, +2 physical assistance, From elevated surface         General transfer comment: pt requesting elevated bed, +2 mod assist to rise and steady as pt moved hands from sitting surface to RW, increased time to pivot/hop to and from chair. Able to stand from lower chair height with heavy boost assist and needs manual assist to manage RW while pivoting on RLE    Ambulation/Gait               General Gait Details: pt reports he is unable due to fatigue, mostly wheelchair level at home unless he has prosthetic donned.       Balance Overall balance assessment: Needs assistance Sitting-balance support: No upper extremity supported, Feet supported Sitting balance-Leahy Scale: Fair     Standing balance support: Bilateral upper extremity supported, Reliant on assistive device  for balance Standing balance-Leahy Scale: Poor Standing balance comment: BUE and external assist +2 needed                             Cognition Arousal/Alertness: Awake/alert Behavior During Therapy: Flat affect Overall Cognitive Status: Impaired/Different from baseline Area of Impairment: Problem solving, Safety/judgement, Following commands, Memory                     Memory: Decreased short-term memory (pt unable to recall prior PT session previous day) Following Commands: Follows one step commands with increased time Safety/Judgement: Decreased awareness of safety, Decreased awareness of deficits   Problem Solving: Slow processing, Decreased initiation, Requires verbal cues General Comments: Slow processing noted. Increased time to follow commands. Very poor insight into deficits but seemingly unconcerned how he will take care of himself at home without assist even though he requires heavy +2 lifting assist to get to/from bed<>chair today.        Exercises Total Joint Exercises Hip ABduction/ADduction: AROM, AAROM, Both, 10 reps, Supine Other Exercises Other Exercises: supine BUE AROM: chest press (RUE), elbow flex/ext (gentle resistance) x10 reps ea Other Exercises: supine BLE AROM: SLR (tactile cues to prevent quad lag) x10 reps ea Other Exercises: static standing at RW x3 minutes for BLE strengthening Other Exercises: STS x 2 reps for strengthening    General Comments General comments (skin integrity, edema, etc.): BP 88/58 (68) supine, then 90/63 (73) seated, then 99/64 (75) sitting in recliner after pivoting (no dizziness standing); BP 100/63 (76) after return to supine from second stand pivot transfer. HR 87-100 bpm with postural changes      Pertinent Vitals/Pain Pain Assessment Pain Assessment: No/denies pain Pain Intervention(s): Monitored during session, Repositioned, Other (comment) (pt reports no pain from sacral wound but refusing to remain in chair and states "sometimes" it bothers him)    Home Living Family/patient expects to be discharged to:: Private residence Living Arrangements:  Alone Available Help at Discharge: Neighbor;Available PRN/intermittently Type of Home: House Home Access: Ramped entrance       Home Layout: One level Home Equipment: Conservation officer, nature (2 wheels);Cane - single point;BSC/3in1;Wheelchair - manual          PT Goals (current goals can now be found in the care plan section) Acute Rehab PT Goals Patient Stated Goal: to go home PT Goal Formulation: With patient Time For Goal Achievement: 07/10/22 Progress towards PT goals: Progressing toward goals    Frequency    Min 3X/week      PT Plan Discharge plan needs to be updated    Co-evaluation   Reason for Co-Treatment: For patient/therapist safety   OT goals addressed during session: ADL's and self-care      AM-PAC PT "6 Clicks" Mobility   Outcome Measure  Help needed turning from your back to your side while in a flat bed without using bedrails?: None Help needed moving from lying on your back to sitting on the side of a flat bed without using bedrails?: A Little Help needed moving to and from a bed to a chair (including a wheelchair)?: A Little Help needed standing up from a chair using your arms (e.g., wheelchair or bedside chair)?: A Lot Help needed to walk in hospital room?: Total Help needed climbing 3-5 steps with a railing? : Total 6 Click Score: 14    End of Session Equipment Utilized During Treatment: Gait belt Activity Tolerance: Patient tolerated treatment  well;Patient limited by fatigue Patient left: in bed;with call bell/phone within reach;with bed alarm set;Other (comment) (encouraged him to remain up in chair but pt refused) Nurse Communication: Mobility status;Need for lift equipment;Other (comment) (pt reports his phone was lost in our ED, also EMT broke his glasses on previous admission) PT Visit Diagnosis: Other abnormalities of gait and mobility (R26.89);Muscle weakness (generalized) (M62.81)     Time: 9842-1031 PT Time Calculation (min) (ACUTE ONLY):  61 min  Charges:  $Therapeutic Exercise: 8-22 mins $Therapeutic Activity: 8-22 mins                     Nekeisha Aure P., PTA Acute Rehabilitation Services Secure Chat Preferred 9a-5:30pm Office: [place] 06/27/2022, 1:55 PM

## 2022-06-27 NOTE — Progress Notes (Signed)
PROGRESS NOTE    Peter Yang  FBP:[id number] DOB: Dec 28, 1955 DOA: 06/25/2022 PCP: Clovia Cuff, MD    Brief Narrative:   Peter Yang is a 67 y.o. male with past medical history of benign prostatic hypertrophy, diabetes mellitus type 2, hypertension, peripheral vascular disease status post left BKA, coronary artery disease status post stent, hyperlipidemia, history of TIA, obstructive sleep apnea who was recently discharged from the hospital on 06/18/2022 after being treated for non-ST elevation MI and hyperglycemia.  He did have left heart cath on 06/17/2022 and medical management was advised.  He also had new onset atrial fibrillation and was discharged on Eliquis.  This time patient was noted to be very hypoglycemic and confused at home when checked in by his friend.  He was borderline hypotensive with elevated WBC at 16.5.  Lactate was 2.4.[name] fecal occult was positive.  CT head did not show any acute findings.  Chest x-ray showed a new infiltrate in the right perihilar region suggestive pneumonia.  In the ED, sepsis protocol was initiated and patient was continued on D5 half-normal saline.  Broad-spectrum antibiotics were given.  Cardiology was consulted and patient was admitted hospital for further evaluation and treatment.  At this time, patient remains hypotensive but asymptomatic.  Physical therapy has been consulted for ambulation.   Assessment and Plan:  Principal Problem:   Type 2 diabetes mellitus with hypoglycemia (HCC) Active Problems:   sepsis secondary to HCAP and possible aspiration    CAD s/p NSTEMI and LHC on 06/17/22 with NSTEMI   Acute metabolic encephalopathy   Hematuria and +fecal occult    AKI (acute kidney injury) ([place])   New onset atrial fibrillation (HCC)   Essential hypertension   Chronic systolic CHF (congestive heart failure) (HCC)   Pressure ulcer of sacral region, stage 2 (HCC)   Dyslipidemia   * Type 2 diabetes mellitus with hypoglycemia (HCC) Received  dextrose with half-normal saline.  Was taking Tresiba 40 units including Jardiance and metformin at home though he was supposed to discontinue insulin..  Continue to hold all hypoglycemics at this time.  We will discontinue insulin on discharge.  Spoke with diabetic coordinator.  sepsis secondary to HCAP and possible aspiration  X-ray showed right perihilar infiltrates.  Patient had leukocytosis tachycardia elevated lactate.   Blood cultures negative in less 2 days.  Procalcitonin elevated at 2.9.  Continue incentive spirometry and flutter valve and supportive care.  MRSA PCR negative.  We will discontinue vancomycin.  Continue cefepime.  CAD s/p NSTEMI and LHC on 06/17/22 with NSTEMI Patient was recently admitted on 8/26 for exertional chest pain and dyspnea found to have NSTEMI.  Patient status postcardiac cath on 06/17/2022 and recommended medical management for severe multivessel CAD with severe calcification.  Patient does not have good targets for PCI and not a candidate for CABG.  Troponin this admission significantly elevated at  [id number] cardiology was consulted.  Continue statins.  Currently Eliquis, aspirin, beta-blocker on hold due to hypotension and concerns for low  hemoglobin-hematuria fecal occult positive.  We will continue IV fluid for 1 more day.  Acute metabolic encephalopathy Improved at this time.  Likely secondary to sepsis hypoglycemia hypokalemia and possible pneumonia.  Possibility of aspiration.  Treat underlying causes.  Hematuria and +fecal occult  Frank blood in urine and +fecal occult on presentation.  Urine bag without hematuria today.  Has been started back on Plavix.  Continue to hold Eliquis.  Had endorsed 1 episode of dark stool as outpatient.  Previous provider had spoken with GI and no need for consultation in the setting of clinical picture.  If patient continues to have drop in hemoglobin we will consult GI.  Continue Protonix 40 mg twice daily.  Hemoglobin today at  10.4.  AKI (acute kidney injury) ([place]) Creatinine today at 1.2.  Likely secondary to hypotension/sepsis.  Continue to monitor closely.  Intake and output charting.  Received IV fluids.  Continue IV fluids for 1 more day.  New onset atrial fibrillation (HCC) Beta-blocker on hold due to hypotension.  Now in sinus rhythm.  Eliquis on hold due to difficult blood positive and hematuria on presentation.  Cardiology on board.  Essential hypertension Patient was hypotensive on presentation.  Patient states that he is always on the lower side.  Continue to jardiance, lisinopril and metoprolol for now.  Chronic systolic CHF (congestive heart failure) (HCC) Volume depleted on presentation.  Received IV fluid bolus and D5 half-normal saline.  We will give 250 mL bolus today due to hypotension but is asymptomatic.  We will continue normal saline for 1 more day since patient is negative balance so far. 2D echocardiogram from 06/17/2022 showed LV ejection fraction of 40 to 45% with indeterminate diastolic function.  ACE inhibitor's Toprol Jardiance on hold at this time.  Patient states that he is always hypotensive.  Pressure ulcer of sacral region, stage 2 ([place]) Present on admission.  Continue wound care.  Dyslipidemia Continue Lipitor  Concern for fall, debility weakness.  Physical therapy has seen the patient and recommended skilled nursing facility placement.    DVT prophylaxis: SCDs Start: 06/25/22 1558   Code Status:     Code Status: DNR  Disposition: Skilled nursing facility as per PT evaluation.  Status is: Inpatient  The patient is  inpatient because: Non-ST elevation MI, infection, antibiotic, hypotension   Family Communication:  None at bedside  Consultants:  Cardiology  Procedures:  None  Antimicrobials:  Vancomycin and cefepime.  Discontinue vancomycin.  Anti-infectives (From admission, onward)    Start     Dose/Rate Route Frequency Ordered Stop   06/26/22 1500   vancomycin (VANCOREADY) IVPB 2000 mg/400 mL        2,000 mg 200 mL/hr over 120 Minutes Intravenous Every 24 hours 06/25/22 1932 07/02/22 1759   06/25/22 2200  ceFEPIme (MAXIPIME) 2 g in sodium chloride 0.9 % 100 mL IVPB        2 g 200 mL/hr over 30 Minutes Intravenous Every 8 hours 06/25/22 1933 07/02/22 2159   06/25/22 1430  ceFEPIme (MAXIPIME) 2 g in sodium chloride 0.9 % 100 mL IVPB  Status:  Discontinued        2 g 200 mL/hr over 30 Minutes Intravenous Every 8 hours 06/25/22 1421 06/25/22 1933   06/25/22 1430  vancomycin (VANCOREADY) IVPB 2000 mg/400 mL        2,000 mg 200 mL/hr over 120 Minutes Intravenous  Once 06/25/22 1421 06/25/22 1726   06/25/22 1415  ceFEPIme (MAXIPIME) 2 g in sodium chloride 0.9 % 100 mL IVPB  Status:  Discontinued        2 g 200 mL/hr over 30 Minutes Intravenous  Once 06/25/22 1411 06/25/22 1421   06/25/22 1415  metroNIDAZOLE (FLAGYL) IVPB 500 mg        500 mg 100 mL/hr over 60 Minutes Intravenous  Once 06/25/22 1411 06/25/22 2034   06/25/22 1415  vancomycin (VANCOCIN) IVPB 1000 mg/200 mL premix  Status:  Discontinued  1,000 mg 200 mL/hr over 60 Minutes Intravenous  Once 06/25/22 1411 06/25/22 1421      Subjective: Today, patient was seen and examined at bedside.  States that he was not able to sleep much.  Denies any dizziness lightheadedness chest pain or shortness of breath.  Objective: Vitals:   06/27/22 0800 06/27/22 1140 06/27/22 1200 06/27/22 1215  BP: (!) 92/59 100/63 (!) 51/37 (!) 86/68  Pulse: 84 90 93   Resp: 15  16   Temp: 98 F (36.7 C)  97.8 F (36.6 C)   TempSrc: Oral     SpO2: 94%  95%   Weight:      Height:        Intake/Output Summary (Last 24 hours) at 06/27/2022 1358 Last data filed at 06/27/2022 0525 Gross per 24 hour  Intake --  Output 1850 ml  Net -1850 ml    Filed Weights   06/25/22 1219  Weight: 94.3 kg    Physical Examination: Body mass index is 25.32 kg/m.  General:  Average built, not in obvious  distress, Communicative,  HENT:   No scleral pallor or icterus noted. Oral mucosa is moist.  Chest:    Diminished breath sounds bilaterally. No crackles or wheezes.  CVS: S1 &S2 heard. No murmur.  Regular rate and rhythm. Abdomen: Soft, nontender, nondistended.  Bowel sounds are heard.   Extremities: No cyanosis, clubbing or edema.  Peripheral pulses are palpable.  Left below-knee amputation, right leg with some dressing. Psych: Alert, awake and oriented, normal mood CNS:  No cranial nerve deficits.  Power equal in all extremities.   Skin: Warm and dry.  Left below-knee amputation, right leg with mild edema and dressing  Data Reviewed:   CBC: Recent Labs  Lab 06/25/22 1230 06/25/22 1257 06/25/22 1706 06/26/22 0403 06/27/22 0610  WBC 16.5*  --   --  8.6 5.0  NEUTROABS 15.1*  --   --   --   --   HGB 14.1 14.6 12.6* 11.5* 10.4*  HCT 42.3 43.0 37.0* 35.9* 30.8*  MCV 98.6  --   --  101.4* 95.7  PLT 267  --   --  134* 124*     Basic Metabolic Panel: Recent Labs  Lab 06/25/22 1257 06/25/22 1417 06/25/22 1706 06/26/22 0403 06/27/22 0610  NA 136 139 135 132* 127*  K 4.5 5.0 5.2* 4.9 4.0  CL  --  109  --  108 104  CO2  --  16*  --  15* 15*  GLUCOSE  --  86  --  171* 251*  BUN  --  47*  --  41* 34*  CREATININE  --  1.45*  --  1.25* 1.24  CALCIUM  --  8.7*  --  7.9* 7.7*  MG  --   --   --   --  2.0     Liver Function Tests: Recent Labs  Lab 06/25/22 1417 06/26/22 0403 06/27/22 0610  AST 67* 46* 42*  ALT 32 26 31  ALKPHOS 61 47 50  BILITOT 1.0 0.8 0.8  PROT 6.1* 5.0* 5.2*  ALBUMIN 2.7* 2.2* 1.9*      Radiology Studies: DG Chest Port 1 View  Result Date: 06/25/2022 CLINICAL DATA:  Chest pain EXAM: PORTABLE CHEST 1 VIEW COMPARISON:  06/15/2022 FINDINGS: Cardiac size is within normal limits. Pulmonary vascularity is unremarkable. New patchy infiltrates are seen in right mid and right lower lung fields. Rest of the lung fields are unremarkable. There is no  pleural  effusion or pneumothorax. IMPRESSION: New infiltrate is seen in right parahilar region and right lower lung fields suggesting pneumonia. Electronically Signed   By: Elmer Picker M.D.   On: 06/25/2022 14:49   CT Head Wo Contrast  Result Date: 06/25/2022 CLINICAL DATA:  Provided history: Headache, new or worsening. Additional history provided: Increased confusion, found on floor, history of prostate cancer. EXAM: CT HEAD WITHOUT CONTRAST TECHNIQUE: Contiguous axial images were obtained from the base of the skull through the vertex without intravenous contrast. RADIATION DOSE REDUCTION: This exam was performed according to the departmental dose-optimization program which includes automated exposure control, adjustment of the mA and/or kV according to patient size and/or use of iterative reconstruction technique. COMPARISON:  Head CT 06/15/2022.  Brain MRI 07/28/2021. FINDINGS: Brain: Mild generalized parenchymal atrophy. Redemonstrated small chronic cortical/subcortical infarcts within the lateral right parietal and temporal lobes. Background mild patchy and ill-defined hypoattenuation within the cerebral white matter, nonspecific but compatible with chronic small vessel ischemic disease. Chronic infarcts within the right corona radiata, right basal ganglia and bilateral cerebellar hemispheres, some of which were better appreciated on the prior brain MRI of 07/28/2021. There is no acute intracranial hemorrhage. No acute demarcated cortical infarct. No extra-axial fluid collection. No evidence of an intracranial mass. No midline shift. Vascular: No hyperdense vessel.  Atherosclerotic calcifications. Skull: No fracture or aggressive osseous lesion. Sinuses/Orbits: No mass or acute finding within the imaged orbits. Tiny mucous retention cyst within the right maxillary sinus. Minimal mucosal thickening within the inferior left maxillary sinus, within the bilateral ethmoid air cells and within the left  frontoethmoidal recess. IMPRESSION: No evidence of acute intracranial abnormality. 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 Mild paranasal sinus disease, as described. Electronically Signed   By: Kellie Simmering D.O.   On: 06/25/2022 14:18      LOS: 1 day    Flora Lipps, MD [facility] Available via Epic secure chat 7am-7pm After these hours, please refer to coverage provider listed on [email or web address] 06/27/2022, 1:58 PM

## 2022-06-27 NOTE — Evaluation (Signed)
Occupational Therapy Evaluation Patient Details Name: Peter Yang MRN: [id number] DOB: 1956-03-10 Today's Date: 06/27/2022   History of Present Illness Pt is a 66 y/o male admitted secondary to hypoglycemia. Recent admission for NSTEMI. PMH includes DM, HTN, L BKA, and CAD.   Clinical Impression   Pt stand pivots in and out of his w/c with RW and typically performs ADLs and IADLs modified independently. He has a neighbor to help with transportation, but reports he sometimes will don his prosthesis and drive himself to the grocery store or appointments. Pt states he sponge bathes and performs sit to stand for pericare and to manage pants. Pt presents with significant weakness, slow movements and slow processing. He needed +2 mod assist for sit to stand and +2 min with increased time to hop or pivot. Pt requires set up to total assist for ADLs. He declined remaining up in chair despite education in benefits. Recommending ST rehab in SNF prior to return to home, pt is currently refusing, preferring to go home.  BP ranged from 90/63 (73) to 100/63 (76) during session with pt complaining of weakness.       Recommendations for follow up therapy are one component of a multi-disciplinary discharge planning process, led by the attending physician.  Recommendations may be updated based on patient status, additional functional criteria and insurance authorization.   Follow Up Recommendations  Skilled nursing-short term rehab (<3 hours/day)    Assistance Recommended at Discharge Frequent or constant Supervision/Assistance  Patient can return home with the following Two people to help with walking and/or transfers;A lot of help with bathing/dressing/bathroom;Assistance with cooking/housework;Assist for transportation;Help with stairs or ramp for entrance    Functional Status Assessment  Patient has had a recent decline in their functional status and demonstrates the ability to make significant  improvements in function in a reasonable and predictable amount of time.  Equipment Recommendations  None recommended by OT    Recommendations for Other Services       Precautions / Restrictions Precautions Precautions: Fall Precaution Comments: L BKA, 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 and does not currently fit well since swelling went down Restrictions Weight Bearing Restrictions: No      Mobility Bed Mobility Overal bed mobility: Needs Assistance Bed Mobility: Rolling, Sit to Supine, Supine to Sit Rolling: Supervision   Supine to sit: Min assist Sit to supine: Min guard   General bed mobility comments: rolled for pericare with rail and increased time, min assist for trunk with supine to sit, no physical assist to return to supine    Transfers Overall transfer level: Needs assistance Equipment used: Rolling walker (2 wheels) Transfers: Sit to/from Stand Sit to Stand: +2 physical assistance, Mod assist           General transfer comment: pt requesting elevated bed, +2 mod assist to rise and steady as pt moved hands from sitting surface to RW, increased time to pivot/hop to and from chair      Balance Overall balance assessment: Needs assistance   Sitting balance-Leahy Scale: Fair     Standing balance support: Bilateral upper extremity supported, Reliant on assistive device for balance Standing balance-Leahy Scale: Poor Standing balance comment: B UE and external assist                           ADL either performed or assessed with clinical judgement   ADL Overall ADL's : Needs assistance/impaired Eating/Feeding: Independent;Sitting   Grooming: Set up;Sitting  Upper Body Bathing: Set up;Sitting   Lower Body Bathing: Total assistance;Sit to/from stand;+2 for physical assistance   Upper Body Dressing : Set up;Sitting   Lower Body Dressing: +2 for physical assistance;Total assistance;Sit to/from stand   Toilet Transfer: +2 for physical assistance;Minimal  assistance;Stand-pivot;Rolling walker (2 wheels)   Toileting- Clothing Manipulation and Hygiene: Total assistance;Bed level               Vision Baseline Vision/History: 1 Wears glasses Additional Comments: glasses broken last hospitalization by EMTs     Perception     Praxis      Pertinent Vitals/Pain Pain Assessment Pain Assessment: No/denies pain     Hand Dominance Right   Extremity/Trunk Assessment Upper Extremity Assessment Upper Extremity Assessment: LUE deficits/detail LUE Deficits / Details: longstanding shoulder deficits, crepitus LUE Coordination: decreased gross motor   Lower Extremity Assessment Lower Extremity Assessment: Defer to PT evaluation LLE Deficits / Details: L BKA   Cervical / Trunk Assessment Cervical / Trunk Assessment: Normal   Communication Communication Communication: No difficulties   Cognition Arousal/Alertness: Awake/alert Behavior During Therapy: Flat affect Overall Cognitive Status: Impaired/Different from baseline Area of Impairment: Problem solving, Safety/judgement, Following commands                       Following Commands: Follows one step commands with increased time Safety/Judgement: Decreased awareness of safety, Decreased awareness of deficits   Problem Solving: Slow processing, Decreased initiation, Requires verbal cues       General Comments       Exercises     Shoulder Instructions      Home Living Family/patient expects to be discharged to:: Private residence Living Arrangements: Alone Available Help at Discharge: Neighbor;Available PRN/intermittently Type of Home: House Home Access: Ramped entrance     Home Layout: One level     Bathroom Shower/Tub: Tub/shower unit;Curtain   Biochemist, clinical: Standard     Home Equipment: Conservation officer, nature (2 wheels);Cane - single point;BSC/3in1;Wheelchair - manual          Prior Functioning/Environment Prior Level of Function : Independent/Modified  Independent             Mobility Comments: Uses WC at home, but able to transfer independently with stand pivot and RW ADLs Comments: Independent with ADLs, uses instacart, sponge bathes, sometimes drives and walks through grocery store        OT Problem List: Decreased strength;Impaired balance (sitting and/or standing);Decreased activity tolerance;Decreased cognition;Decreased safety awareness      OT Treatment/Interventions: Self-care/ADL training;DME and/or AE instruction;Therapeutic activities;Patient/family education;Balance training    OT Goals(Current goals can be found in the care plan section) Acute Rehab OT Goals OT Goal Formulation: With patient Time For Goal Achievement: 07/11/22 Potential to Achieve Goals: Good  OT Frequency: Min 2X/week    Co-evaluation PT/OT/SLP Co-Evaluation/Treatment: Yes Reason for Co-Treatment: For patient/therapist safety   OT goals addressed during session: ADL's and self-care      AM-PAC OT "6 Clicks" Daily Activity     Outcome Measure Help from another person eating meals?: None Help from another person taking care of personal grooming?: A Little Help from another person toileting, which includes using toliet, bedpan, or urinal?: Total Help from another person bathing (including washing, rinsing, drying)?: A Lot Help from another person to put on and taking off regular upper body clothing?: A Little Help from another person to put on and taking off regular lower body clothing?: Total 6 Click Score: 14   End  of Session Equipment Utilized During Treatment: Rolling walker (2 wheels);Gait belt  Activity Tolerance: Patient tolerated treatment well Patient left: in bed;with call bell/phone within reach;with bed alarm set  OT Visit Diagnosis: Muscle weakness (generalized) (M62.81);Unsteadiness on feet (R26.81);Other symptoms and signs involving cognitive function                Time: 1007-1219 OT Time Calculation (min): 64  min Charges:  OT General Charges $OT Visit: 1 Visit OT Evaluation $OT Eval Moderate Complexity: 1 Mod OT Treatments $Self Care/Home Management : 8-22 mins  Cleta Alberts, OTR/L Acute Rehabilitation Services Office: [phone]  Malka So 06/27/2022, 11:55 AM

## 2022-06-27 NOTE — Plan of Care (Signed)
  Problem: Education: Goal: Understanding of CV disease, CV risk reduction, and recovery process will improve Outcome: Progressing   Problem: Activity: Goal: Ability to return to baseline activity level will improve Outcome: Progressing   Problem: Activity: Goal: Ability to tolerate increased activity will improve Outcome: Progressing

## 2022-06-27 NOTE — Progress Notes (Signed)
Inpatient Diabetes Program Recommendations  AACE/ADA: New Consensus Statement on Inpatient Glycemic Control (2015)  Target Ranges:  Prepandial:   less than 140 mg/dL      Peak postprandial:   less than 180 mg/dL (1-2 hours)      Critically ill patients:  140 - 180 mg/dL   Lab Results  Component Value Date   GLUCAP 200 (H) 06/27/2022   HGBA1C 7.5 (H) 06/15/2022    Review of Glycemic Control  Latest Reference Range & Units 06/26/22 12:34 06/26/22 16:18 06/26/22 22:06 06/27/22 08:31  Glucose-Capillary 70 - 99 mg/dL 194 (H) 191 (H) 314 (H) 200 (H)   Diabetes history: DM 2 Outpatient Diabetes medications:  Tresiba 40 units daily (ordered by Dr. Meredith Pel per patient), Jardiance 10 mg daily, Metformin 1000 mg bid Current orders for Inpatient glycemic control:  None  Inpatient Diabetes Program Recommendations:    Spoke with patient regarding DM management.  Asked patient why he is taking [place]?  He states that it was ordered by his endocrinologist, Dr. Meredith Pel.  Discussed that he had low blood sugar last hospitalization and that insulin was not reordered however he states he was not aware. He wears a Freestyle CGM.  Asked him what blood sugars run at home, and he states the low 100's.  Asked if his CGM alarms with low blood sugar and he stated that he was not sure.  Briefly discussed the dangers of low blood sugars and likely need for adjustment in insulin.  He has a difficult time getting around due to his prosthesis not fitting well and states that this makes it difficult for him to go to appointments.  He orders groceries from [employer].   Discussed with MD.  Will follow.  Again likely needs either reduction or discontinuation of home basal insulin.  Needs close f/u with his endocrinologist- Dr. Meredith Pel.   Thanks,  Adah Perl, RN, BC-ADM Inpatient Diabetes Coordinator Pager [phone]  (8a-5p)

## 2022-06-27 NOTE — Progress Notes (Addendum)
Rounding Note    Patient Name: Peter Yang Date of Encounter: 06/27/2022  [place] Cardiologist:  Dr. Nyoka Cowden [facility])  Subjective   Remains hypotensive but completely asymptomatic. He denies any chest pain, shortness of breath, palpitations, lightheadedness/dizziness. Hemoglobin continues to drop but patient states his urine was clearer today.  Inpatient Medications    Scheduled Meds:  atorvastatin  80 mg Oral Daily   clopidogrel  75 mg Oral Daily   gabapentin  300 mg Oral QHS   pantoprazole  40 mg Oral BID   Continuous Infusions:  ceFEPime (MAXIPIME) IV 2 g (06/27/22 0519)   vancomycin Stopped (06/26/22 2000)   PRN Meds: acetaminophen **OR** acetaminophen   Vital Signs    Vitals:   06/26/22 2000 06/26/22 2326 06/27/22 0357 06/27/22 0800  BP: 92/68 100/71 (!) 87/63 (!) 92/59  Pulse: 98 98 84 84  Resp:  '16 16 15  '$ Temp:  98.1 F (36.7 C) 98.1 F (36.7 C) 98 F (36.7 C)  TempSrc:  Oral Oral Oral  SpO2: 92% 95% 95% 94%  Weight:      Height:        Intake/Output Summary (Last 24 hours) at 06/27/2022 0925 Last data filed at 06/27/2022 0525 Gross per 24 hour  Intake --  Output 1850 ml  Net -1850 ml      06/25/2022   12:19 PM 06/18/2022    3:57 AM 06/17/2022    5:28 AM  Last 3 Weights  Weight (lbs) 208 lb 210 lb 1.6 oz 208 lb 11.2 oz  Weight (kg) 94.348 kg 95.3 kg 94.666 kg      Telemetry    Normal sinus rhythm with rates in the 80s to 90s. - Personally Reviewed  ECG    No new ECG tracing today. - Personally Reviewed  Physical Exam   GEN: No acute distress.   Neck: No JVD. Cardiac: RRR. No murmurs, rubs, or gallops.  Respiratory: Clear to auscultation bilaterally. No wheezes, rhonchi, or rales. GI: Soft, non-distended, and non-tender. MS: Trace lower extremity edema of right lower extremity. S/p left BKA. Skin: Warm and dry. Neuro:  No focal deficits. Psych: Normal affect. Responds appropriately.  Labs    High  Sensitivity Troponin:   Recent Labs  Lab 06/15/22 0209 06/15/22 0413 06/15/22 1135 06/15/22 1522 06/25/22 1417  TROPONINIHS 1,420* 2,596* 6,096* 5,251* 7,765*     Chemistry Recent Labs  Lab 06/25/22 1417 06/25/22 1706 06/26/22 0403 06/27/22 0610  NA 139 135 132* 127*  K 5.0 5.2* 4.9 4.0  CL 109  --  108 104  CO2 16*  --  15* 15*  GLUCOSE 86  --  171* 251*  BUN 47*  --  41* 34*  CREATININE 1.45*  --  1.25* 1.24  CALCIUM 8.7*  --  7.9* 7.7*  MG  --   --   --  2.0  PROT 6.1*  --  5.0* 5.2*  ALBUMIN 2.7*  --  2.2* 1.9*  AST 67*  --  46* 42*  ALT 32  --  26 31  ALKPHOS 61  --  47 50  BILITOT 1.0  --  0.8 0.8  GFRNONAA 53*  --  >60 >60  ANIONGAP 14  --  9 8    Lipids No results for input(s): "CHOL", "TRIG", "HDL", "LABVLDL", "LDLCALC", "CHOLHDL" in the last 168 hours.  Hematology Recent Labs  Lab 06/25/22 1230 06/25/22 1257 06/25/22 1706 06/26/22 0403 06/27/22 0610  WBC 16.5*  --   --  8.6 5.0  RBC 4.29  --   --  3.54* 3.22*  HGB 14.1   < > 12.6* 11.5* 10.4*  HCT 42.3   < > 37.0* 35.9* 30.8*  MCV 98.6  --   --  101.4* 95.7  MCH 32.9  --   --  32.5 32.3  MCHC 33.3  --   --  32.0 33.8  RDW 14.6  --   --  14.5 14.4  PLT 267  --   --  134* 124*   < > = values in this interval not displayed.   Thyroid No results for input(s): "TSH", "FREET4" in the last 168 hours.  BNPNo results for input(s): "BNP", "PROBNP" in the last 168 hours.  DDimer No results for input(s): "DDIMER" in the last 168 hours.   Radiology    DG Chest Port 1 View  Result Date: 06/25/2022 CLINICAL DATA:  Chest pain EXAM: PORTABLE CHEST 1 VIEW COMPARISON:  06/15/2022 FINDINGS: Cardiac size is within normal limits. Pulmonary vascularity is unremarkable. New patchy infiltrates are seen in right mid and right lower lung fields. Rest of the lung fields are unremarkable. There is no pleural effusion or pneumothorax. IMPRESSION: New infiltrate is seen in right parahilar region and right lower lung fields  suggesting pneumonia. Electronically Signed   By: Elmer Picker M.D.   On: 06/25/2022 14:49   CT Head Wo Contrast  Result Date: 06/25/2022 CLINICAL DATA:  Provided history: Headache, new or worsening. Additional history provided: Increased confusion, found on floor, history of prostate cancer. EXAM: CT HEAD WITHOUT CONTRAST TECHNIQUE: Contiguous axial images were obtained from the base of the skull through the vertex without intravenous contrast. RADIATION DOSE REDUCTION: This exam was performed according to the departmental dose-optimization program which includes automated exposure control, adjustment of the mA and/or kV according to patient size and/or use of iterative reconstruction technique. COMPARISON:  Head CT 06/15/2022.  Brain MRI 07/28/2021. FINDINGS: Brain: Mild generalized parenchymal atrophy. Redemonstrated small chronic cortical/subcortical infarcts within the lateral right parietal and temporal lobes. Background mild patchy and ill-defined hypoattenuation within the cerebral white matter, nonspecific but compatible with chronic small vessel ischemic disease. Chronic infarcts within the right corona radiata, right basal ganglia and bilateral cerebellar hemispheres, some of which were better appreciated on the prior brain MRI of 07/28/2021. There is no acute intracranial hemorrhage. No acute demarcated cortical infarct. No extra-axial fluid collection. No evidence of an intracranial mass. No midline shift. Vascular: No hyperdense vessel.  Atherosclerotic calcifications. Skull: No fracture or aggressive osseous lesion. Sinuses/Orbits: No mass or acute finding within the imaged orbits. Tiny mucous retention cyst within the right maxillary sinus. Minimal mucosal thickening within the inferior left maxillary sinus, within the bilateral ethmoid air cells and within the left frontoethmoidal recess. IMPRESSION: No evidence of acute intracranial abnormality. Parenchymal atrophy, chronic small vessel  ischemic disease and chronic infarcts, as described and unchanged from the recent prior head CT of 06/15/2022. Mild paranasal sinus disease, as described. Electronically Signed   By: Kellie Simmering D.O.   On: 06/25/2022 14:18    Cardiac Studies   Echocardiogram 06/15/2022: Impressions: 1. Left ventricular ejection fraction, by estimation, is 40 to 45%. The  left ventricle has mildly decreased function. The left ventricle  demonstrates regional wall motion abnormalities (see scoring  diagram/findings for description). There is mild left  ventricular hypertrophy. Left ventricular diastolic function could not be  evaluated. There is moderate hypokinesis of the left ventricular,  basal-mid inferior wall and inferolateral wall.  2. Right ventricular systolic function is mildly reduced. The right  ventricular size is normal.   3. Left atrial size was severely dilated.   4. The mitral valve is abnormal. Moderate mitral valve regurgitation.   5. The aortic valve is tricuspid. Aortic valve regurgitation is not  visualized.   6. Aortic dilatation noted. There is moderate dilatation of the ascending  aorta, measuring 47 mm. There is mild dilatation of the aortic root,  measuring 42 mm.   7. The inferior vena cava is normal in size with greater than 50%  respiratory variability, suggesting right atrial pressure of 3 mmHg.   Comparison(s): Changes from prior study are noted. 02/11/2022 [facility]):  LVEF 35-40%, global hypokinesis worse laterally.  _______________  Left Cardiac Catheterization 06/17/2022: Conclusions: Severe multivessel coronary artery disease with severe calcification, as detailed below, including 50% mid LAD stenosis, 95% stenosis of dominant branch of large D1 and chronic total occlusion of lateral branch of D1, chronic total occlusion of LCx, and chronic total occlusion of ostial through mid RCA (including stent placed in 01/2022) with left-to-right collaterals. Normal left  ventricular filling pressure (LVEDP 10 mmHg).   Recommendations: Consider viability study with particular attention to the inferior wall.  If RCA territory is viable, cardiac surgery consultation for CABG should be considered.  If RCA territory is not viable (or the patient is not a candidate for CABG), medical therapy versus PCI to dominant branch of D1 (particularly if the patient reports recurrent angina) should be considered. Hold ticagrelor pending further workup and possible cardiac surgery consultation.  If the patient does not undergo CABG, I would favor 12 months of clopidogrel + DOAC therapy in the setting of new atrial fibrillation and NSTEMI. Aggressive secondary prevention of coronary artery disease. Diagnostic Dominance: Right    Patient Profile     66 y.o. 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 Also has a history of ischemic cardiomyopathy/ chronic systolic CHF with EF of 85-88%, paroxysmal atrial fibrillation on Eliquis, PVD s/p left BKA, CVA, hypertension, type 2 diabetes mellitus, obstructive sleep apnea, BPH, and sacral pressure ulcer who presented to the ED after being found down, altered, and hypoglycemic to the 30s. Cardiology was consulted for further evaluation of elevated troponin.  Assessment & Plan    NSTEMI vs Demand Ischemia CAD Patient has known severe multivessel CAD as described above which is being managed medical as he is not felt to be a CABG candidate and disease is not amenable to PCI. Patient present after being found down for unknown duration with initial blood glucose in the 30s. Work-up  revealed elevated high-sensitivity troponin of 7,765. EKG showed no acute ischemic changes.  - No chest pain.  - Plavix was initially held due to gross hematuria and positive fecal occult.  - Home Toprol-XL on hold due to hypotension. - Continue Lipitor '80mg'$  daily. - Current troponin elevation suspected to likely be demand in the setting of known severe multivessel disease and profound hypoglycemia. Unfortunately, based on recent cardiac catheterization last week, his disease is not amenable for PCI so will continue to treat with medical  therapy.   Ischemic Cardiomyopathy Chronic Systolic CHF Recent Echo on 06/15/2022 showed LVEF of 40-45% with moderate hypokinesis of the basal-mid inferior wall and inferolateral wall as well as mildly reduced RV systolic function, and moderate MR. On presentation, he was volume down and hypotensive after being found down for unknown duration. He has been receiving IV fluids. Net negative 839 mL so far this admission. - Euvolemic on exam.  - Unable to tolerate GDMT right now due to hypotension. - Continue to monitor volume status closely especially with IV fluids. - Continue to monitor I/Os and daily weights.  No update weights since admission. Will place care order asking for daily weights.  Paroxysmal Atrial Fibrillation Found during recent admission in 05/2022. - Maintaining sinus rhythm.  - Toprol-XL on hold due to hypotension. - CHA2D-VASc = 7 (CAD/ PAD, CHF, HTN, DM, CVA x2, age). Home Eliquis held on admission due to hematuria. Resume when able.  Hypotension History of hypertension but hypotensive this admission. - Remains hypotensive with systolic BP as low as the 70s at times. Asymptomatic with this. - Home Toprol-XL and Lisinopril on hold. - Otherwise, management per primary team. May need additional fluids.  Moderate Mitral Regurgitation Noted on recent Echo in 05/2022. - Will continue to monitor as an outpatient.  Moderate Ascending Aortic  Aneurysm Recent Echo in 05/2022 showed moderate dilatation of the ascending aorta measuring 47 mm and mild dilation of the aortic root measuring 42 mm. - Will continue to monitor as an outpatient.  AKI Creatinine 1.45 on admission, up from 1.14 on 06/18/2022. Likely due to hypotension. - Improved with IV fluids. 1.24 today. - Continue to monitor.  Normocytic Anemia Hematuria Positive Hemoccult Hemoglobin has dropped from 14.6 on admission to 10.4 today. Some of this may be due to dilution as patient has received a lot of IV fluids and all cell lines are down form admission. - Plavix was initially held but this was restarted yesterday given recent PCI. - OK to continue to hold Eliquis for now. - Otherwise, per primary team.  Otherwise, per primary team: - Type 2 diabetes with profound hypoglycemia - Acute metabolic encephalopathy - Possible HCAP/ Sepsis - Pressure ulcer of sacral region   For questions or updates, please contact [place] Please consult [email or web address] for contact info under        Signed, Darreld Mclean, PA-C  06/27/2022, 9:25 AM    Patient seen and examined and agree with Sande Rives, PA-C as detailed above.   In brief, the patient is a 66 y.o. 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 Cardiology was consulted due to trop elevation 7765.    Patient with known severe multivessel CAD with cath 05/2022 showing 50% mid LAD stenosis, 95% stenosis of dominant branch of large D1, CTO of lateral branch of D1, CTO of left circumflex, CTO of ostial through mid RCA with left-to-right collateral. Was deemed not a candidate for CABG and no targets for PCI. Has been recommended for medical therapy. He also has known HFrEF with LVEF 40-45% with  hypokinesis of the basal-to-mid inferior wall and inferolateral wall, moderate MR, moderate ascending aortic aneurysm 45m, RAP 366mg. GDMT has been limited due to hypotension.   He presented on this admission after being  found down for unknown duration with initial BG 30s. He received D50 with immediate improvement in mental status. Cardiology was consulted for trop of 7765.   Overall, suspect trop elevation likely due to demand in the setting of being down for unknown duration with profound hypoglycemia. He has known significant multivessel CAD and is not a candidate for further intervention. Will continue with medical therapy as able.  Notably, course has been complicated by hematuria and anemia but plavix was resumed given recent stent and suspect he had been off plavix for several days. Also has had persistent hypotension limiting GDMT.  Currently, the patient states he feels better. No chest pain or SOB.   GEN: Discheveled appearing  Neck: No JVD Cardiac: Tachycardic, regular, soft systolic murmur Respiratory: CTAB GI: Soft, nontender, non-distended  MS: Left BKA, RLE with no significant edema Neuro:  Nonfocal  Psych: Normal affect     Plan: -Suspect trop elevation due to demand in the setting of being down for several days and hypoglycemia; no plans for repeat ischemic evaluation as patient is not a candidate for surgery and has no targets for PCI based on cath 06/17/22 -Will continue medical therapy of underlying CAD -Cannot tolerate GDMT for HF given hypotension; will add as able -Plavix resumed given recent PCI in 01/2022; holding apixaban given hematuria and anemia   Gwyndolyn Kaufman, MD

## 2022-06-28 DIAGNOSIS — I4891 Unspecified atrial fibrillation: Secondary | ICD-10-CM | POA: Diagnosis not present

## 2022-06-28 DIAGNOSIS — E785 Hyperlipidemia, unspecified: Secondary | ICD-10-CM | POA: Diagnosis not present

## 2022-06-28 DIAGNOSIS — I10 Essential (primary) hypertension: Secondary | ICD-10-CM | POA: Diagnosis not present

## 2022-06-28 DIAGNOSIS — I25110 Atherosclerotic heart disease of native coronary artery with unstable angina pectoris: Secondary | ICD-10-CM | POA: Diagnosis not present

## 2022-06-28 LAB — CBC
HCT: 29.1 % — ABNORMAL LOW (ref 39.0–52.0)
Hemoglobin: 10 g/dL — ABNORMAL LOW (ref 13.0–17.0)
MCH: 33 pg (ref 26.0–34.0)
MCHC: 34.4 g/dL (ref 30.0–36.0)
MCV: 96 fL (ref 80.0–100.0)
Platelets: 121 10*3/uL — ABNORMAL LOW (ref 150–400)
RBC: 3.03 MIL/uL — ABNORMAL LOW (ref 4.22–5.81)
RDW: 14.3 % (ref 11.5–15.5)
WBC: 3.9 10*3/uL — ABNORMAL LOW (ref 4.0–10.5)
nRBC: 0 % (ref 0.0–0.2)

## 2022-06-28 LAB — BASIC METABOLIC PANEL
Anion gap: 6 (ref 5–15)
BUN: 33 mg/dL — ABNORMAL HIGH (ref 8–23)
CO2: 17 mmol/L — ABNORMAL LOW (ref 22–32)
Calcium: 7.7 mg/dL — ABNORMAL LOW (ref 8.9–10.3)
Chloride: 111 mmol/L (ref 98–111)
Creatinine, Ser: 1.18 mg/dL (ref 0.61–1.24)
GFR, Estimated: >60 mL/min (ref >60)
Glucose, Bld: 245 mg/dL — ABNORMAL HIGH (ref 70–99)
Potassium: 4.2 mmol/L (ref 3.5–5.1)
Sodium: 134 mmol/L — ABNORMAL LOW (ref 135–145)

## 2022-06-28 LAB — GLUCOSE, CAPILLARY
Glucose-Capillary: 181 mg/dL — ABNORMAL HIGH (ref 70–99)
Glucose-Capillary: 184 mg/dL — ABNORMAL HIGH (ref 70–99)
Glucose-Capillary: 272 mg/dL — ABNORMAL HIGH (ref 70–99)
Glucose-Capillary: 250 mg/dL — ABNORMAL HIGH (ref 70–99)

## 2022-06-28 LAB — MAGNESIUM: Magnesium: 2.2 mg/dL (ref 1.7–2.4)

## 2022-06-28 MED ORDER — SODIUM CHLORIDE 0.9 % IV SOLN: INTRAVENOUS | Status: AC

## 2022-06-28 NOTE — Plan of Care (Signed)
  Problem: Activity: Goal: Ability to return to baseline activity level will improve Outcome: Progressing   Problem: Nutrition: Goal: Adequate nutrition will be maintained Outcome: Progressing

## 2022-06-28 NOTE — Progress Notes (Addendum)
PROGRESS NOTE    Peter Yang  UDJ:[id number] DOB: 1956/07/11 DOA: 06/25/2022 PCP: Clovia Cuff, MD    Brief Narrative:   Peter Yang is a 66 y.o. male with past medical history of benign prostatic hypertrophy, diabetes mellitus type 2, hypertension, peripheral vascular disease status post left BKA, coronary artery disease status post stent, hyperlipidemia, history of TIA, obstructive sleep apnea who was recently discharged from the hospital on 06/18/2022 after being treated for non-ST elevation MI and hyperglycemia.  He did have left heart cath on 06/17/2022 and medical management was advised.  He also had new onset atrial fibrillation and was discharged on Eliquis.  This time, patient was noted to be very hypoglycemic and confused at home when checked in by his friend.  He was borderline hypotensive with elevated WBC at 16.5.  Lactate was 2.4.[name] fecal occult was positive.  CT head did not show any acute findings.  Chest x-ray showed a new infiltrate in the right perihilar region suggestive pneumonia.  In the ED, sepsis protocol was initiated and patient was continued on D5 half-normal saline.  Broad-spectrum antibiotics were given.  Cardiology was consulted and patient was admitted hospital for further evaluation and treatment.  At this time, patient remains hypotensive but asymptomatic.  Physical therapy has been consulted for ambulation.   Assessment and Plan:  Principal Problem:   Type 2 diabetes mellitus with hypoglycemia (HCC) Active Problems:   sepsis secondary to HCAP and possible aspiration    CAD s/p NSTEMI and LHC on 06/17/22 with NSTEMI   Acute metabolic encephalopathy   Hematuria and +fecal occult    AKI (acute kidney injury) ([place])   New onset atrial fibrillation (HCC)   Essential hypertension   Chronic systolic CHF (congestive heart failure) (HCC)   Pressure ulcer of sacral region, stage 2 ([place])   Dyslipidemia  Type 2 diabetes mellitus with hypoglycemia (HCC) Received  dextrose with half-normal saline.  Was taking Tresiba 40 units including Jardiance and metformin at home though he was supposed to discontinue insulin..  Continue to hold all hypoglycemics at this time.  We will discontinue insulin on discharge.  Spoke with diabetic coordinator.  On sliding scale insulin at this time since Jardiance and metformin on hold.  Latest POC glucose of 184.  sepsis secondary to HCAP and possible aspiration  X-ray showed right perihilar infiltrates.  Patient had leukocytosis tachycardia elevated lactate.   Blood cultures negative in 3 days.  Procalcitonin elevated at 2.9.  Continue incentive spirometry, flutter valve and supportive care.  MRSA PCR negative.  Continue cefepime for now.  Plan to complete 5-day course.[name]  CAD s/p NSTEMI and LHC on 06/17/22 with NSTEMI Patient was recently admitted on 8/26 for exertional chest pain and dyspnea found to have NSTEMI.  Patient status postcardiac cath on 06/17/2022 and recommended medical management for severe multivessel CAD with severe calcification.  Patient does not have good targets for PCI and not a candidate for CABG.  Troponin this admission significantly elevated at  [id number] cardiology was consulted.  Continue statins.  Currently Eliquis, aspirin, beta-blocker on hold due to hypotension and concerns for low  hemoglobin-hematuria fecal occult positive.  On IV fluids with improvement in his blood pressure.  Latest blood pressure of 98/72.  Reemphasized strict intake and output and daily weights with this time.    Acute metabolic encephalopathy Improved at this time.  Likely secondary to sepsis ,hypoglycemia, hypokalemia and possible pneumonia.  Possibility of aspiration.  Treat underlying causes.  Hematuria and +fecal occult  Frank blood in urine and +fecal occult on presentation.  Urine bag without hematuria today.  Has been started back on Plavix.  Continue to hold Eliquis.   Previous provider had spoken with GI and no need for  consultation in the setting of clinical picture.  If patient continues to have drop in hemoglobin we will consult GI.  Hemoglobin today at 10.0 from 10.4.[name]  Continue Protonix 40 mg twice daily.  Consider restarting Eliquis by tomorrow if remains stable.  AKI (acute kidney injury) ([place]) Creatinine today at 1.1.  Likely secondary to hypotension/sepsis.  Continue to monitor closely.  Intake and output charting.  Received IV fluids.  We will continue IV fluid for 1 more day.  New onset atrial fibrillation (HCC) Beta-blocker on hold due to hypotension.  Now in sinus rhythm.  Eliquis on hold due to occult blood positive and hematuria on presentation.  Cardiology on board.  Could consider restarting Eliquis soon.  Essential hypertension Patient was hypotensive on presentation.  Patient states that he is always on the lower side.  Continue to hold jardiance, lisinopril and metoprolol for now.  Chronic systolic CHF (congestive heart failure) (HCC) Volume depleted on presentation.  Received IV fluid bolus and D5 half-normal saline.  Negative balance charted but inaccurate oral intake.  Spoke with nursing staff.  We will need strict intake and output charting and daily weights.  2D echocardiogram from 06/17/2022 showed LV ejection fraction of 40 to 45% with indeterminate diastolic function.  ACE inhibitor's Toprol Jardiance on hold at this time.  As per cardiology unable to start these medications at this time.  Patient states that he is always hypotensive.  Patient still appears to be compensated so we will continue IV fluid for 1 more day.  Pressure ulcer of sacral region, stage 2 ([place]) Present on admission.  Continue wound care.  Dyslipidemia Continue Lipitor  Concern for fall, debility weakness.  Physical therapy has seen the patient and recommended skilled nursing facility placement.   DVT prophylaxis: SCDs Start: 06/25/22 1558   Code Status:     Code Status: DNR  Disposition: Skilled nursing  facility as per PT evaluation patient wishes to go home with home health.  Likely disposition home in 1 to 2 days if blood pressure improved  Status is: Inpatient  The patient is  inpatient because: Non-ST elevation MI, infection, antibiotic, hypotension, pending clinical improvement.   Family Communication:  None at bedside.  No contact available.  Consultants:  Cardiology  Procedures:  None  Antimicrobials:  Cefepime.  Anti-infectives (From admission, onward)    Start     Dose/Rate Route Frequency Ordered Stop   06/26/22 1500  vancomycin (VANCOREADY) IVPB 2000 mg/400 mL  Status:  Discontinued        2,000 mg 200 mL/hr over 120 Minutes Intravenous Every 24 hours 06/25/22 1932 06/27/22 1420   06/25/22 2200  ceFEPIme (MAXIPIME) 2 g in sodium chloride 0.9 % 100 mL IVPB        2 g 200 mL/hr over 30 Minutes Intravenous Every 8 hours 06/25/22 1933 07/02/22 2159   06/25/22 1430  ceFEPIme (MAXIPIME) 2 g in sodium chloride 0.9 % 100 mL IVPB  Status:  Discontinued        2 g 200 mL/hr over 30 Minutes Intravenous Every 8 hours 06/25/22 1421 06/25/22 1933   06/25/22 1430  vancomycin (VANCOREADY) IVPB 2000 mg/400 mL        2,000 mg 200 mL/hr over 120 Minutes Intravenous  Once 06/25/22  1421 06/25/22 1726   06/25/22 1415  ceFEPIme (MAXIPIME) 2 g in sodium chloride 0.9 % 100 mL IVPB  Status:  Discontinued        2 g 200 mL/hr over 30 Minutes Intravenous  Once 06/25/22 1411 06/25/22 1421   06/25/22 1415  metroNIDAZOLE (FLAGYL) IVPB 500 mg        500 mg 100 mL/hr over 60 Minutes Intravenous  Once 06/25/22 1411 06/25/22 2034   06/25/22 1415  vancomycin (VANCOCIN) IVPB 1000 mg/200 mL premix  Status:  Discontinued        1,000 mg 200 mL/hr over 60 Minutes Intravenous  Once 06/25/22 1411 06/25/22 1421      Subjective: Today, patient was seen and examined at bedside.  Denies any pain, nausea, vomiting, fever, chills or rigor.  Has not had a bowel movement.  Urinary catheter with dark yellow  urine.  Objective: Vitals:   06/27/22 2021 06/27/22 2326 06/28/22 0337 06/28/22 1145  BP: (!) 92/59 (!) '88/64 90/67 98/72 '$  Pulse: 86 88 86 89  Resp: '18 18 18 20  '$ Temp: 97.9 F (36.6 C) 98 F (36.7 C) 97.8 F (36.6 C) 98 F (36.7 C)  TempSrc: Oral Oral Oral Oral  SpO2: 97% 97% 98% 97%  Weight:      Height:        Intake/Output Summary (Last 24 hours) at 06/28/2022 1346 Last data filed at 06/28/2022 1145 Gross per 24 hour  Intake 100 ml  Output 2650 ml  Net -2550 ml    Filed Weights   06/25/22 1219  Weight: 94.3 kg    Physical Examination: Body mass index is 25.32 kg/m.   General:  Average built, not in obvious distress, Communicative HENT:   No scleral pallor or icterus noted. Oral mucosa is moist.  Chest:   Diminished breath sounds bilaterally. No crackles or wheezes.  CVS: S1 &S2 heard. No murmur.  Regular rate and rhythm. Abdomen: Soft, nontender, nondistended.  Bowel sounds are heard.   Extremities: No cyanosis, clubbing.  Left below-knee amputation, right leg with some dressing. Psych: Alert, awake and oriented, normal mood CNS:  No cranial nerve deficits.  Power equal in all extremities.   Skin: Warm and dry.  Left below-knee amputation, right leg with mild edema and dressing  CBC: Recent Labs  Lab 06/25/22 1230 06/25/22 1257 06/25/22 1706 06/26/22 0403 06/27/22 0610 06/28/22 0257  WBC 16.5*  --   --  8.6 5.0 3.9*  NEUTROABS 15.1*  --   --   --   --   --   HGB 14.1 14.6 12.6* 11.5* 10.4* 10.0*  HCT 42.3 43.0 37.0* 35.9* 30.8* 29.1*  MCV 98.6  --   --  101.4* 95.7 96.0  PLT 267  --   --  134* 124* 121*     Basic Metabolic Panel: Recent Labs  Lab 06/25/22 1417 06/25/22 1706 06/26/22 0403 06/27/22 0610 06/28/22 0257  NA 139 135 132* 127* 134*  K 5.0 5.2* 4.9 4.0 4.2  CL 109  --  108 104 111  CO2 16*  --  15* 15* 17*  GLUCOSE 86  --  171* 251* 245*  BUN 47*  --  41* 34* 33*  CREATININE 1.45*  --  1.25* 1.24 1.18  CALCIUM 8.7*  --  7.9* 7.7*  7.7*  MG  --   --   --  2.0 2.2     Liver Function Tests: Recent Labs  Lab 06/25/22 1417 06/26/22 0403 06/27/22 6195  AST 67* 46* 42*  ALT 32 26 31  ALKPHOS 61 47 50  BILITOT 1.0 0.8 0.8  PROT 6.1* 5.0* 5.2*  ALBUMIN 2.7* 2.2* 1.9*      Radiology Studies: No results found.    LOS: 2 days    Flora Lipps, MD [facility] Available via Epic secure chat 7am-7pm After these hours, please refer to coverage provider listed on [email or web address] 06/28/2022, 1:46 PM

## 2022-06-28 NOTE — Care Management Important Message (Signed)
Important Message  Patient Details  Name: Peter Yang MRN: [id number] Date of Birth: 03/31/1956   Medicare Important Message Given:  Yes     Peter Yang Montine Circle 06/28/2022, 4:18 PM

## 2022-06-28 NOTE — Progress Notes (Signed)
Physical Therapy Treatment Patient Details Name: Peter Yang MRN: [id number] DOB: [birth year] Today's Date: 06/28/2022   History of Present Illness Pt is a 66 y/o male admitted secondary to hypoglycemia. Recent admission for NSTEMI. PMH includes DM, HTN, L BKA, and CAD.    PT Comments    Pt received in supine, agreeable to therapy session with encouragement, pt with slow processing but improved initiation from previous session and able to perform transfers with +1 minA but +2 assist for safety/line mgmt. Pt with decreased insight into deficits and reports he continues to plan for DC home but would benefit from short term low intensity rehab due to deconditioning. Pt reports he is agreeable to HHPT, would benefit from supervision/assist at home if able to arrange increased help. Pt continues to benefit from PT services to progress toward functional mobility goals.    Recommendations for follow up therapy are one component of a multi-disciplinary discharge planning process, led by the attending physician.  Recommendations may be updated based on patient status, additional functional criteria and insurance authorization.  Follow Up Recommendations  Skilled nursing-short term rehab (<3 hours/day) (pt likely to refuse, lives alone) Can patient physically be transported by private vehicle: No   Assistance Recommended at Discharge Intermittent Supervision/Assistance  Patient can return home with the following A lot of help with bathing/dressing/bathroom;Assistance with cooking/housework;Assist for transportation;Help with stairs or ramp for entrance;Two people to help with walking and/or transfers   Equipment Recommendations  None recommended by PT (may benefit from hospital bed although likely to refuse)    Recommendations for Other Services       Precautions / Restrictions Precautions Precautions: Fall Precaution Comments: L BKA, 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 and does not currently fit well since  swelling went down, monitor BP Restrictions Weight Bearing Restrictions: No     Mobility  Bed Mobility Overal bed mobility: Needs Assistance Bed Mobility: Rolling, Sit to Supine, Supine to Sit Rolling: Supervision   Supine to sit: Min assist Sit to supine: Min guard   General bed mobility comments: pt asking for trunk assist with RUE to sit upright toward his L side, he attempted to sit up on L EOB unassisted but unable due to L shoulder chronic weakness/pain.    Transfers Overall transfer level: Needs assistance Equipment used: Rolling walker (2 wheels) Transfers: Sit to/from Stand, Bed to chair/wheelchair/BSC Sit to Stand: From elevated surface, Min assist, Min guard, +2 physical assistance Stand pivot transfers: +2 physical assistance, From elevated surface, Min guard         General transfer comment: pt requesting elevated bed,unable to stand from lowest bed height or "the height mine is at home", needed bed elevated ~5" to stand upright with min guard. Pt needing minA for stand>sit safety due to poor eccentric control to sit. +2 for safety.    Ambulation/Gait               General Gait Details: pt reports he is unable due to fatigue, mostly wheelchair level at home unless he has prosthetic donned.    Balance Overall balance assessment: Needs assistance Sitting-balance support: No upper extremity supported, Feet supported Sitting balance-Leahy Scale: Fair     Standing balance support: Bilateral upper extremity supported, Reliant on assistive device for balance Standing balance-Leahy Scale: Poor Standing balance comment: BUE and external assist needed                            Cognition Arousal/Alertness: Awake/alert  Behavior During Therapy: Flat affect Overall Cognitive Status: Impaired/Different from baseline Area of Impairment: Problem solving, Safety/judgement, Following commands, Memory                     Memory: Decreased  short-term memory (pt unable to recall prior PT session previous day) Following Commands: Follows one step commands with increased time Safety/Judgement: Decreased awareness of safety, Decreased awareness of deficits   Problem Solving: Slow processing, Decreased initiation, Requires verbal cues General Comments: Slow processing noted. Increased time to follow commands. Very poor insight into deficits but seemingly unconcerned how he will take care of himself at home without assist even though he requires bed height elevated to stand fully, he was unable to stand from the bed height he has at home without physical assist.        Exercises Total Joint Exercises Quad Sets: AROM, Both, 10 reps, Supine Hip ABduction/ADduction: AROM, AAROM, Both, 10 reps, Supine Other Exercises Other Exercises: = Other Exercises: supine knee flex/ext bilaterally x10 reps for warm-up    General Comments General comments (skin integrity, edema, etc.): BP 87/58 supine resting prior to OOB, then 100/76 at end of session seated in bed. Asymptomatic with transfers. HR/SpO2 [facility] on RA      Pertinent Vitals/Pain Pain Assessment Pain Assessment: No/denies pain Pain Intervention(s): Monitored during session, Repositioned           PT Goals (current goals can now be found in the care plan section) Acute Rehab PT Goals Patient Stated Goal: to go home PT Goal Formulation: With patient Time For Goal Achievement: 07/10/22 Progress towards PT goals: Progressing toward goals    Frequency    Min 3X/week      PT Plan Current plan remains appropriate       AM-PAC PT "6 Clicks" Mobility   Outcome Measure  Help needed turning from your back to your side while in a flat bed without using bedrails?: None Help needed moving from lying on your back to sitting on the side of a flat bed without using bedrails?: A Little Help needed moving to and from a bed to a chair (including a wheelchair)?: A Lot (+2 for safety  and cueing) Help needed standing up from a chair using your arms (e.g., wheelchair or bedside chair)?: A Little Help needed to walk in hospital room?: Total Help needed climbing 3-5 steps with a railing? : Total 6 Click Score: 14    End of Session Equipment Utilized During Treatment: Gait belt Activity Tolerance: Patient tolerated treatment well;Patient limited by fatigue Patient left: in bed;with call bell/phone within reach;with bed alarm set;Other (comment) (sitting EOB to eat his lunch; refused to remain up in chair) Nurse Communication: Mobility status;Other (comment) (pt reports his phone was lost in our ED, also EMT broke his glasses on previous admission) PT Visit Diagnosis: Other abnormalities of gait and mobility (R26.89);Muscle weakness (generalized) (M62.81)     Time: 6803-2122 PT Time Calculation (min) (ACUTE ONLY): 39 min  Charges:  $Therapeutic Exercise: 8-22 mins $Therapeutic Activity: 23-37 mins                     Armstead Heiland P., PTA Acute Rehabilitation Services Secure Chat Preferred 9a-5:30pm Office: [place] 06/28/2022, 6:55 PM

## 2022-06-29 DIAGNOSIS — E785 Hyperlipidemia, unspecified: Secondary | ICD-10-CM | POA: Diagnosis not present

## 2022-06-29 DIAGNOSIS — I4891 Unspecified atrial fibrillation: Secondary | ICD-10-CM | POA: Diagnosis not present

## 2022-06-29 DIAGNOSIS — I10 Essential (primary) hypertension: Secondary | ICD-10-CM | POA: Diagnosis not present

## 2022-06-29 DIAGNOSIS — I25110 Atherosclerotic heart disease of native coronary artery with unstable angina pectoris: Secondary | ICD-10-CM | POA: Diagnosis not present

## 2022-06-29 LAB — GLUCOSE, CAPILLARY
Glucose-Capillary: 187 mg/dL — ABNORMAL HIGH (ref 70–99)
Glucose-Capillary: 208 mg/dL — ABNORMAL HIGH (ref 70–99)
Glucose-Capillary: 240 mg/dL — ABNORMAL HIGH (ref 70–99)
Glucose-Capillary: 229 mg/dL — ABNORMAL HIGH (ref 70–99)

## 2022-06-29 LAB — BASIC METABOLIC PANEL
Anion gap: 6 (ref 5–15)
BUN: 26 mg/dL — ABNORMAL HIGH (ref 8–23)
CO2: 19 mmol/L — ABNORMAL LOW (ref 22–32)
Calcium: 7.8 mg/dL — ABNORMAL LOW (ref 8.9–10.3)
Chloride: 111 mmol/L (ref 98–111)
Creatinine, Ser: 1.2 mg/dL (ref 0.61–1.24)
GFR, Estimated: >60 mL/min (ref >60)
Glucose, Bld: 238 mg/dL — ABNORMAL HIGH (ref 70–99)
Potassium: 4.5 mmol/L (ref 3.5–5.1)
Sodium: 136 mmol/L (ref 135–145)

## 2022-06-29 LAB — CBC
HCT: 31.2 % — ABNORMAL LOW (ref 39.0–52.0)
Hemoglobin: 10.4 g/dL — ABNORMAL LOW (ref 13.0–17.0)
MCH: 32.1 pg (ref 26.0–34.0)
MCHC: 33.3 g/dL (ref 30.0–36.0)
MCV: 96.3 fL (ref 80.0–100.0)
Platelets: 128 10*3/uL — ABNORMAL LOW (ref 150–400)
RBC: 3.24 MIL/uL — ABNORMAL LOW (ref 4.22–5.81)
RDW: 14.4 % (ref 11.5–15.5)
WBC: 5.8 10*3/uL (ref 4.0–10.5)
nRBC: 0 % (ref 0.0–0.2)

## 2022-06-29 LAB — MAGNESIUM: Magnesium: 2.2 mg/dL (ref 1.7–2.4)

## 2022-06-29 MED ORDER — APIXABAN 5 MG PO TABS
5.0000 mg | ORAL_TABLET | Freq: Two times a day (BID) | ORAL | Status: DC
  Administered 2022-06-29 – 2022-06-30 (×2): 5 mg via ORAL
  Filled 2022-06-29 (×2): qty 1

## 2022-06-29 NOTE — Progress Notes (Signed)
PROGRESS NOTE    Peter Yang  VHQ:[id number] DOB: Sep 22, 1956 DOA: 06/25/2022 PCP: Clovia Cuff, MD    Brief Narrative:   Peter Yang is a 66 y.o. male with past medical history of benign prostatic hypertrophy, diabetes mellitus type 2, hypertension, peripheral vascular disease status post left BKA, coronary artery disease status post stent, hyperlipidemia, history of TIA, obstructive sleep apnea who was recently discharged from the hospital on 06/18/2022 after being treated for non-ST elevation MI and hyperglycemia.  He did have left heart cath on 06/17/2022 and medical management was advised.  He also had new onset atrial fibrillation and was discharged on Eliquis.  This time, patient was noted to be very hypoglycemic and confused at home when checked in by his friend.  He was borderline hypotensive with elevated WBC at 16.5.  Lactate was 2.4.[name] fecal occult was positive.  CT head did not show any acute findings.  Chest x-ray showed a new infiltrate in the right perihilar region suggestive pneumonia.  In the ED, sepsis protocol was initiated and patient was continued on D5 half-normal saline.  Broad-spectrum antibiotics were given.  Cardiology was consulted and patient was admitted hospital for further evaluation and treatment.  At this time, patient remains hypotensive but asymptomatic.  Physical therapy has been consulted for ambulation.   Assessment and Plan:  Principal Problem:   Type 2 diabetes mellitus with hypoglycemia (HCC) Active Problems:   sepsis secondary to HCAP and possible aspiration    CAD s/p NSTEMI and LHC on 06/17/22 with NSTEMI   Acute metabolic encephalopathy   Hematuria and +fecal occult    AKI (acute kidney injury) ([place])   New onset atrial fibrillation (HCC)   Essential hypertension   Chronic systolic CHF (congestive heart failure) (HCC)   Pressure ulcer of sacral region, stage 2 (HCC)   Dyslipidemia  Type 2 diabetes mellitus with hypoglycemia (HCC) Received  dextrose with half-normal saline initially.  Patient was taking Tresiba 40 units including Jardiance and metformin at home.  Patient tells me that he is supposed to be on Tresiba as per his endocrinologist.  Because he was hypoglycemic I have discussed with him regarding the need for cutting down significantly on it if he were to continue.  Continue to hold all oral hypoglycemics at this time.  r.  On sliding scale insulin at this time since Jardiance and metformin on hold.  Latest POC glucose of 240.  sepsis secondary to HCAP and possible aspiration  X-ray showed right perihilar infiltrates.    Blood cultures negative in 3 days.  Procalcitonin elevated at 2.9.  Continue incentive spirometry, flutter valve and supportive care.  MRSA PCR negative.  Continue cefepime for now.  Plan is to complete 5-day course.[name]  CAD s/p NSTEMI and LHC on 06/17/22 with NSTEMI Patient was recently admitted on 8/26 for exertional chest pain and dyspnea found to have NSTEMI.  Patient status postcardiac cath on 06/17/2022 and recommended medical management for severe multivessel CAD with severe calcification.  Patient does not have good targets for PCI and not a candidate for CABG.  Troponin this admission significantly elevated at  [id number], cardiology was consulted.  Continue statins.  Currently Eliquis, aspirin, beta-blocker on hold due to hypotension and concerns for low  hemoglobin-hematuria fecal occult positive.  On IV fluids with improvement in his blood pressure.  Latest blood pressure of 93/69.  Reemphasized strict intake and output and daily weights with this time.  We will discontinue IV fluids today.  Acute metabolic encephalopathy Improved  at this time.  Likely secondary to sepsis ,hypoglycemia, hypokalemia and possible pneumonia.  Possibility of aspiration.  Treat underlying causes.  Hematuria and +fecal occult  Frank blood in urine and +fecal occult on presentation.  Urine bag without hematuria today.  Has been  started back on Plavix.   Previous provider had spoken with GI and no need for consultation in the setting of clinical picture.  If patient continues to have drop in hemoglobin we will consult GI.  Hemoglobin at 10.4.[name]  Continue Protonix 40 mg twice daily.  Restart Eliquis starting today.  AKI (acute kidney injury) ([place]) Creatinine today at 1.2.  Likely secondary to hypotension/sepsis.  Continue to monitor closely.  Intake and output charting.  Received IV fluids.  Will discontinue IV fluids.  New onset atrial fibrillation (HCC) Beta-blocker on hold due to hypotension.  Now in sinus rhythm.  Will start Eliquis starting today.  Essential hypertension Patient was hypotensive on presentation.  Patient states that he is always on the lower side.  Continue to hold jardiance, lisinopril and metoprolol for now.  Chronic systolic CHF (congestive heart failure) (HCC) Volume depleted on presentation.  Received IV fluid bolus and D5 half-normal saline.  Negative balance at this time at 6536 ml continue strict intake and output charting and daily weights.  2D echocardiogram from 06/17/2022 showed LV ejection fraction of 40 to 45% with indeterminate diastolic function.  ACE inhibitor's Toprol Jardiance on hold at this time.  As per cardiology unable to start these medications at this time.  Patient states that he is always hypotensive.  We will hold off with IV fluids today.  Pressure ulcer of sacral region, stage 2 ([place]) Present on admission.  Continue wound care.  Dyslipidemia Continue Lipitor  Concern for fall, debility weakness.  Physical therapy has seen the patient and recommended skilled nursing facility placement.   DVT prophylaxis: SCDs Start: 06/25/22 1558 apixaban (ELIQUIS) tablet 5 mg   Code Status:     Code Status: DNR  Disposition: Skilled nursing facility as per PT evaluation but patient wishes to go home with home health.  Likely disposition home by tomorrow if blood pressure remains  stable.  Status is: Inpatient  The patient is  inpatient because: Non-ST elevation MI, infection, antibiotic, hypotension, pending clinical improvement.   Family Communication:  None at bedside.  No contact available.  Consultants:  Cardiology  Procedures:  None  Antimicrobials:  Cefepime.  Anti-infectives (From admission, onward)    Start     Dose/Rate Route Frequency Ordered Stop   06/26/22 1500  vancomycin (VANCOREADY) IVPB 2000 mg/400 mL  Status:  Discontinued        2,000 mg 200 mL/hr over 120 Minutes Intravenous Every 24 hours 06/25/22 1932 06/27/22 1420   06/25/22 2200  ceFEPIme (MAXIPIME) 2 g in sodium chloride 0.9 % 100 mL IVPB        2 g 200 mL/hr over 30 Minutes Intravenous Every 8 hours 06/25/22 1933 07/02/22 2159   06/25/22 1430  ceFEPIme (MAXIPIME) 2 g in sodium chloride 0.9 % 100 mL IVPB  Status:  Discontinued        2 g 200 mL/hr over 30 Minutes Intravenous Every 8 hours 06/25/22 1421 06/25/22 1933   06/25/22 1430  vancomycin (VANCOREADY) IVPB 2000 mg/400 mL        2,000 mg 200 mL/hr over 120 Minutes Intravenous  Once 06/25/22 1421 06/25/22 1726   06/25/22 1415  ceFEPIme (MAXIPIME) 2 g in sodium chloride 0.9 % 100  mL IVPB  Status:  Discontinued        2 g 200 mL/hr over 30 Minutes Intravenous  Once 06/25/22 1411 06/25/22 1421   06/25/22 1415  metroNIDAZOLE (FLAGYL) IVPB 500 mg        500 mg 100 mL/hr over 60 Minutes Intravenous  Once 06/25/22 1411 06/25/22 2034   06/25/22 1415  vancomycin (VANCOCIN) IVPB 1000 mg/200 mL premix  Status:  Discontinued        1,000 mg 200 mL/hr over 60 Minutes Intravenous  Once 06/25/22 1411 06/25/22 1421      Subjective: Today, patient was seen and examined at bedside.  Denies any dizziness, lightheadedness, nausea, vomiting, fever, chills or rigor.   Objective: Vitals:   06/29/22 0328 06/29/22 0500 06/29/22 0800 06/29/22 1200  BP: 98/61  91/82 93/69  Pulse: 90  98 90  Resp: '16  16 20  '$ Temp: 97.8 F (36.6 C)   98 F  (36.7 C)  TempSrc: Oral   Oral  SpO2: 96%  94% 97%  Weight:  100 kg    Height:        Intake/Output Summary (Last 24 hours) at 06/29/2022 1602 Last data filed at 06/29/2022 1100 Gross per 24 hour  Intake --  Output 3050 ml  Net -3050 ml    Filed Weights   06/25/22 1219 06/29/22 0500  Weight: 94.3 kg 100 kg    Physical Examination: Body mass index is 26.84 kg/m.   General:  Average built, not in obvious distress, Communicative HENT:   No scleral pallor or icterus noted. Oral mucosa is moist.  Chest:  Diminished breath sounds bilaterally. No crackles or wheezes.  CVS: S1 &S2 heard. No murmur.  Regular rate and rhythm. Abdomen: Soft, nontender, nondistended.  Bowel sounds are heard.   Extremities: No cyanosis, clubbing or edema.  Peripheral pulses are palpable.  Left below-knee amputation, right leg with some dressing Psych: Alert, awake and oriented, normal mood CNS:  No cranial nerve deficits.  Power equal in all extremities.   Skin: Warm and dry.  Left below-knee amputation, right leg with mild edema and dressing.  CBC: Recent Labs  Lab 06/25/22 1230 06/25/22 1257 06/25/22 1706 06/26/22 0403 06/27/22 0610 06/28/22 0257 06/29/22 1011  WBC 16.5*  --   --  8.6 5.0 3.9* 5.8  NEUTROABS 15.1*  --   --   --   --   --   --   HGB 14.1   < > 12.6* 11.5* 10.4* 10.0* 10.4*  HCT 42.3   < > 37.0* 35.9* 30.8* 29.1* 31.2*  MCV 98.6  --   --  101.4* 95.7 96.0 96.3  PLT 267  --   --  134* 124* 121* 128*   < > = values in this interval not displayed.     Basic Metabolic Panel: Recent Labs  Lab 06/25/22 1417 06/25/22 1706 06/26/22 0403 06/27/22 0610 06/28/22 0257 06/29/22 1011  NA 139 135 132* 127* 134* 136  K 5.0 5.2* 4.9 4.0 4.2 4.5  CL 109  --  108 104 111 111  CO2 16*  --  15* 15* 17* 19*  GLUCOSE 86  --  171* 251* 245* 238*  BUN 47*  --  41* 34* 33* 26*  CREATININE 1.45*  --  1.25* 1.24 1.18 1.20  CALCIUM 8.7*  --  7.9* 7.7* 7.7* 7.8*  MG  --   --   --  2.0 2.2  2.2     Liver Function Tests:  Recent Labs  Lab 06/25/22 1417 06/26/22 0403 06/27/22 0610  AST 67* 46* 42*  ALT 32 26 31  ALKPHOS 61 47 50  BILITOT 1.0 0.8 0.8  PROT 6.1* 5.0* 5.2*  ALBUMIN 2.7* 2.2* 1.9*      Radiology Studies: No results found.    LOS: 3 days    Flora Lipps, MD [facility] Available via Epic secure chat 7am-7pm After these hours, please refer to coverage provider listed on [email or web address] 06/29/2022, 4:02 PM

## 2022-06-30 DIAGNOSIS — I25110 Atherosclerotic heart disease of native coronary artery with unstable angina pectoris: Secondary | ICD-10-CM | POA: Diagnosis not present

## 2022-06-30 DIAGNOSIS — G9341 Metabolic encephalopathy: Secondary | ICD-10-CM | POA: Diagnosis not present

## 2022-06-30 DIAGNOSIS — N179 Acute kidney failure, unspecified: Secondary | ICD-10-CM | POA: Diagnosis not present

## 2022-06-30 DIAGNOSIS — E11649 Type 2 diabetes mellitus with hypoglycemia without coma: Secondary | ICD-10-CM | POA: Diagnosis not present

## 2022-06-30 LAB — CULTURE, BLOOD (ROUTINE X 2)
Culture: NO GROWTH
Culture: NO GROWTH
Special Requests: ADEQUATE

## 2022-06-30 LAB — GLUCOSE, CAPILLARY
Glucose-Capillary: 217 mg/dL — ABNORMAL HIGH (ref 70–99)
Glucose-Capillary: 233 mg/dL — ABNORMAL HIGH (ref 70–99)

## 2022-06-30 MED ORDER — FINASTERIDE 5 MG PO TABS: 5.0000 mg | ORAL_TABLET | Freq: Every day | ORAL | 2 refills | Status: DC

## 2022-06-30 MED ORDER — TRESIBA FLEXTOUCH 100 UNIT/ML ~~LOC~~ SOPN: 10.0000 [IU] | PEN_INJECTOR | Freq: Every day | SUBCUTANEOUS | Status: DC

## 2022-06-30 MED ORDER — PANTOPRAZOLE SODIUM 40 MG PO TBEC: 40.0000 mg | DELAYED_RELEASE_TABLET | Freq: Every day | ORAL | 2 refills | Status: DC

## 2022-06-30 NOTE — TOC Transition Note (Signed)
Transition of Care [facility]) - CM/SW Discharge Note   Patient Details  Name: Rei Medlen MRN: [id number] Date of Birth: 1956-09-20  Transition of Care [facility]) CM/SW Contact:  Carles Collet, RN Phone Number: 06/30/2022, 11:29 AM   Clinical Narrative:     Spoke to patient at bedside. He does not have transportation nor does he have WC or prosthesis. PTAR set up and called, Nurse aware. Notified [facility] that he will DC today. No other TOC needs identified  Final next level of care: [place] Barriers to Discharge: No Barriers Identified   Patient Goals and CMS Choice Patient states their goals for this hospitalization and ongoing recovery are:: return home with [facility] CMS Medicare.gov Compare Post Acute Care list provided to:: Patient Choice offered to / list presented to : Patient  Discharge Placement                       Discharge Plan and Services   Discharge Planning Services: CM Consult Post Acute Care Choice: Home Health                    HH Arranged: PT, RN, OT [facility] Agency: [facility] Date Economy: 06/30/22 Time [place]: 1129 Representative spoke with at [place]: [place] Determinants of Health ([place]) Interventions     Readmission Risk Interventions     No data to display

## 2022-06-30 NOTE — Discharge Summary (Addendum)
Physician Discharge Summary  Peter Yang NKN:[id number] DOB: 02-02-1956 DOA: 06/25/2022  PCP: Clovia Cuff, MD  Admit date: 06/25/2022 Discharge date: 06/30/2022  Admitted From: Home  Discharge disposition: Home with home health  Recommendations for Outpatient Follow-Up:   Follow up with your primary care provider in 2 weeks. Follow-up with cardiology as outpatient in 1 to 2 weeks Check CBC, BMP, magnesium in the next visit Unable to use goal-directed medical treatment for congestive heart failure due to persistent hypotension.  Will need to follow-up with PCP/cardiology to initiate as outpatient as tolerated.   Discharge Diagnosis:   Principal Problem:   Type 2 diabetes mellitus with hypoglycemia (HCC) Active Problems:   sepsis secondary to HCAP and possible aspiration    CAD s/p NSTEMI and LHC on 06/17/22 with NSTEMI   Acute metabolic encephalopathy   Hematuria and +fecal occult    AKI (acute kidney injury) ([place])   New onset atrial fibrillation (HCC)   Essential hypertension   Chronic systolic CHF (congestive heart failure) (HCC)   Pressure ulcer of sacral region, stage 2 ([place])   Dyslipidemia  Discharge Condition: Improved.  Diet recommendation: Low sodium, heart healthy.  Carbohydrate-modified.    Wound care: None.  Code status: DNR   History of Present Illness:   Peter Yang is a 66 y.o. male with past medical history of benign prostatic hypertrophy, diabetes mellitus type 2, hypertension, peripheral vascular disease status post left BKA, coronary artery disease status post stent, hyperlipidemia, history of TIA, obstructive sleep apnea who was recently discharged from the hospital on 06/18/2022 after being treated for non-ST elevation MI and hyperglycemia.  He did have left heart cath on 06/17/2022 and medical management was advised.  He also had new onset atrial fibrillation and was discharged on Eliquis.  This time, patient was noted to be very hypoglycemic and  confused at home when checked in by his friend.  He was borderline hypotensive with elevated WBC at 16.5.  Lactate was 2.4.[name] fecal occult was positive.  CT head did not show any acute findings.  Chest x-ray showed a new infiltrate in the right perihilar region suggestive pneumonia.  In the ED, sepsis protocol was initiated and patient was continued on D5 half-normal saline.  Broad-spectrum antibiotics were given.  Cardiology was consulted and patient was admitted hospital for further evaluation and treatment.  Hospital Course:   Following conditions were addressed during hospitalization as listed below,  Type 2 diabetes mellitus with hypoglycemia ([place]) Received dextrose with half-normal saline initially.  Patient was taking Tresiba 40 units including Jardiance and metformin at home.  Patient tells me that he is supposed to be on Tresiba as per his endocrinologist.  Because he was hypoglycemic I have discussed with him regarding the need for cutting down significantly and will change to 10 units at nighttime discharge. We will continue to hold Jardiance but continue metformin on discharge   Severe sepsis secondary to HCAP and possible aspiration  X-ray showed right perihilar infiltrates.    Blood cultures negative.  Sepsis physiology has improved.  Breathing status has stabilized.  Received cefepime in hospitalization and has completed 5-day course.     CAD s/p NSTEMI and LHC on 06/17/22 with NSTEMI Patient was recently admitted on 8/26 for exertional chest pain and dyspnea found to have NSTEMI.  Patient status postcardiac cath on 06/17/2022 and recommended medical management for severe multivessel CAD with severe calcification.  Patient does not have good targets for PCI and not a candidate for CABG.  Troponin this admission significantly elevated at  [id number], cardiology was consulted.  Continue statins, Eliquis, aspirin. beta-blocker on hold due to hypotension.  Received IV fluids during  hospitalization for low blood pressure.     Acute metabolic encephalopathy Improved at this time.  Likely secondary to sepsis ,hypoglycemia, hypokalemia and possible pneumonia.     Hematuria and +fecal occult  Frank blood in urine and +fecal occult on presentation.  This has resolved at this time.  Patient has been restarted back on Plavix and Eliquis.  Previous provider had spoken with GI and no need for consultation in the setting of clinical picture.  Latest hemoglobin at 10.4.[name]  Continue Protonix 40 mg daily at home.    AKI (acute kidney injury) ([place]) Latest creatinine  at 1.2.  Likely secondary to hypotension/sepsis.  Improved during hospitalization.    New onset atrial fibrillation (HCC) Beta-blocker on hold due to hypotension.  Now in sinus rhythm.  Continue Eliquis on discharge  Essential hypertension Patient was hypotensive on presentation.  Patient states that he is always on the lower side.  Continue to hold jardiance, lisinopril and metoprolol on discharge.   Chronic systolic CHF (congestive heart failure) (HCC) Volume depleted on presentation.  Received IV fluid bolus and D5 half-normal saline.   2D echocardiogram from 06/17/2022 showed LV ejection fraction of 40 to 45% with indeterminate diastolic function.  ACE inhibitor's Toprol Jardiance on hold at this time.  As per cardiology, unable to start these medications at this time.  Will need outpatient cardiology follow-up before reinitiating these medications.     Pressure ulcer of sacral region, stage 2 ([place]) Present on admission.  Continue wound care.   Dyslipidemia Continue Lipitor   Concern for fall, debility weakness.  Physical therapy has seen the patient and recommended skilled nursing facility placement but patient wishes to go home with home health services despite multiple conversation..  Disposition.  At this time, patient is stable for disposition home with outpatient allergy in PCP follow-up.  Medical  Consultants:   Cardiology  Procedures:    None Subjective:   Today, patient was seen and examined at bedside.  Denies any nausea vomiting fever chills or rigor.  Wishes to go home.  Denies any chest pain.  Mild cough but no shortness of breath.  Discharge Exam:   Vitals:   06/30/22 0400 06/30/22 0800  BP: (!) 96/59 94/64  Pulse: 90 86  Resp: 16 19  Temp: 98 F (36.7 C) 98.6 F (37 C)  SpO2: 97%    Vitals:   06/30/22 0000 06/30/22 0400 06/30/22 0411 06/30/22 0800  BP: 107/67 (!) 96/59  94/64  Pulse: 100 90  86  Resp: '17 16  19  '$ Temp: 97.9 F (36.6 C) 98 F (36.7 C)  98.6 F (37 C)  TempSrc: Oral Oral  Oral  SpO2: 97% 97%    Weight:   101.9 kg   Height:        General: Alert awake, not in obvious distress HENT: pupils equally reacting to light,  No scleral pallor or icterus noted. Oral mucosa is moist.  Chest:    Diminished breath sounds bilaterally. No crackles or wheezes.  CVS: S1 &S2 heard. No murmur.  Regular rate and rhythm. Abdomen: Soft, nontender, nondistended.  Bowel sounds are heard.   Extremities: Left below-knee amputation, dressing on the right leg. Psych: Alert, awake and oriented, normal mood CNS:  No cranial nerve deficits.  Moves extremities. Skin: Warm and dry.   The results  of significant diagnostics from this hospitalization (including imaging, microbiology, ancillary and laboratory) are listed below for reference.     Diagnostic Studies:   DG Chest Port 1 View  Result Date: 06/25/2022 CLINICAL DATA:  Chest pain EXAM: PORTABLE CHEST 1 VIEW COMPARISON:  06/15/2022 FINDINGS: Cardiac size is within normal limits. Pulmonary vascularity is unremarkable. New patchy infiltrates are seen in right mid and right lower lung fields. Rest of the lung fields are unremarkable. There is no pleural effusion or pneumothorax. IMPRESSION: New infiltrate is seen in right parahilar region and right lower lung fields suggesting pneumonia. Electronically Signed   By:  Elmer Picker M.D.   On: 06/25/2022 14:49   CT Head Wo Contrast  Result Date: 06/25/2022 CLINICAL DATA:  Provided history: Headache, new or worsening. Additional history provided: Increased confusion, found on floor, history of prostate cancer. EXAM: CT HEAD WITHOUT CONTRAST TECHNIQUE: Contiguous axial images were obtained from the base of the skull through the vertex without intravenous contrast. RADIATION DOSE REDUCTION: This exam was performed according to the departmental dose-optimization program which includes automated exposure control, adjustment of the mA and/or kV according to patient size and/or use of iterative reconstruction technique. COMPARISON:  Head CT 06/15/2022.  Brain MRI 07/28/2021. FINDINGS: Brain: Mild generalized parenchymal atrophy. Redemonstrated small chronic cortical/subcortical infarcts within the lateral right parietal and temporal lobes. Background mild patchy and ill-defined hypoattenuation within the cerebral white matter, nonspecific but compatible with chronic small vessel ischemic disease. Chronic infarcts within the right corona radiata, right basal ganglia and bilateral cerebellar hemispheres, some of which were better appreciated on the prior brain MRI of 07/28/2021. There is no acute intracranial hemorrhage. No acute demarcated cortical infarct. No extra-axial fluid collection. No evidence of an intracranial mass. No midline shift. Vascular: No hyperdense vessel.  Atherosclerotic calcifications. Skull: No fracture or aggressive osseous lesion. Sinuses/Orbits: No mass or acute finding within the imaged orbits. Tiny mucous retention cyst within the right maxillary sinus. Minimal mucosal thickening within the inferior left maxillary sinus, within the bilateral ethmoid air cells and within the left frontoethmoidal recess. IMPRESSION: No evidence of acute intracranial abnormality. 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 Mild paranasal sinus disease, as described. Electronically Signed   By: Kellie Simmering D.O.   On: 06/25/2022 14:18     Labs:   Basic Metabolic Panel: Recent Labs  Lab 06/25/22 1417 06/25/22 1706 06/26/22 0403 06/27/22 0610 06/28/22 0257 06/29/22 1011  NA 139 135 132* 127* 134* 136  K 5.0 5.2* 4.9 4.0 4.2 4.5  CL 109  --  108 104 111 111  CO2 16*  --  15* 15* 17* 19*  GLUCOSE 86  --  171* 251* 245* 238*  BUN 47*  --  41* 34* 33* 26*  CREATININE 1.45*  --  1.25* 1.24 1.18 1.20  CALCIUM 8.7*  --  7.9* 7.7* 7.7* 7.8*  MG  --   --   --  2.0 2.2 2.2   GFR Estimated Creatinine Clearance: 75.3 mL/min (by C-G formula based on SCr of 1.2 mg/dL). Liver Function Tests: Recent Labs  Lab 06/25/22 1417 06/26/22 0403 06/27/22 0610  AST 67* 46* 42*  ALT 32 26 31  ALKPHOS 61 47 50  BILITOT 1.0 0.8 0.8  PROT 6.1* 5.0* 5.2*  ALBUMIN 2.7* 2.2* 1.9*   No results for input(s): "LIPASE", "AMYLASE" in the last 168 hours. No results for input(s): "  AMMONIA" in the last 168 hours. Coagulation profile Recent Labs  Lab 06/25/22 1230  INR 1.2    CBC: Recent Labs  Lab 06/25/22 1230 06/25/22 1257 06/25/22 1706 06/26/22 0403 06/27/22 0610 06/28/22 0257 06/29/22 1011  WBC 16.5*  --   --  8.6 5.0 3.9* 5.8  NEUTROABS 15.1*  --   --   --   --   --   --   HGB 14.1   < > 12.6* 11.5* 10.4* 10.0* 10.4*  HCT 42.3   < > 37.0* 35.9* 30.8* 29.1* 31.2*  MCV 98.6  --   --  101.4* 95.7 96.0 96.3  PLT 267  --   --  134* 124* 121* 128*   < > = values in this interval not displayed.   Cardiac Enzymes: Recent Labs  Lab 06/25/22 1439  CKTOTAL 466*   BNP: Invalid input(s): "POCBNP" CBG: Recent Labs  Lab 06/29/22 0806 06/29/22 1238 06/29/22 1553 06/29/22 2105 06/30/22 0849  GLUCAP 187* 208* 240* 229* 217*   D-Dimer No results for input(s): "DDIMER" in the last 72 hours. Hgb A1c No results for input(s): "HGBA1C" in the last 72  hours. Lipid Profile No results for input(s): "CHOL", "HDL", "LDLCALC", "TRIG", "CHOLHDL", "LDLDIRECT" in the last 72 hours. Thyroid function studies No results for input(s): "TSH", "T4TOTAL", "T3FREE", "THYROIDAB" in the last 72 hours.  Invalid input(s): "FREET3" Anemia work up No results for input(s): "VITAMINB12", "FOLATE", "FERRITIN", "TIBC", "IRON", "RETICCTPCT" in the last 72 hours. Microbiology Recent Results (from the past 240 hour(s))  Blood culture (routine x 2)     Status: None   Collection Time: 06/25/22 11:53 AM   Specimen: BLOOD  Result Value Ref Range Status   Specimen Description BLOOD A-LINE  Final   Special Requests   Final    BOTTLES DRAWN AEROBIC AND ANAEROBIC Blood Culture adequate volume   Culture   Final    NO GROWTH 5 DAYS Performed at [facility], [address], [place], [place] [address]    Report Status 06/30/2022 FINAL  Final  Urine Culture     Status: None   Collection Time: 06/25/22 12:56 PM   Specimen: Urine, Clean Catch  Result Value Ref Range Status   Specimen Description URINE, CLEAN CATCH  Final   Special Requests NONE  Final   Culture   Final    NO GROWTH Performed at [facility], [place] [address] [place], [place] [address]    Report Status 06/27/2022 FINAL  Final  Blood culture (routine x 2)     Status: None   Collection Time: 06/25/22  4:58 PM   Specimen: BLOOD  Result Value Ref Range Status   Specimen Description BLOOD SITE NOT SPECIFIED  Final   Special Requests   Final    BOTTLES DRAWN AEROBIC AND ANAEROBIC Blood Culture results may not be optimal due to an excessive volume of blood received in culture bottles   Culture   Final    NO GROWTH 5 DAYS Performed at [facility], [place] [address] [place], [place] [address]    Report Status 06/30/2022 FINAL  Final  MRSA Next Gen by PCR, Nasal     Status: None   Collection Time: 06/26/22  8:39 AM   Specimen: Nasal Mucosa; Nasal Swab  Result Value Ref Range Status    MRSA by PCR Next Gen NOT DETECTED NOT DETECTED Final    Comment: (NOTE) The GeneXpert MRSA Assay (FDA approved for NASAL  specimens only), is one component of a comprehensive MRSA colonization surveillance program. It is not intended to diagnose MRSA infection nor to guide or monitor treatment for MRSA infections. Test performance is not FDA approved in patients less than 64 years old. Performed at [facility], [place] [address] [place], [place] [address]      Discharge Instructions:   Discharge Instructions     Diet - low sodium heart healthy   Complete by: As directed    Diet Carb Modified   Complete by: As directed    Discharge instructions   Complete by: As directed    Follow-up with cardiology in 1 weeks after discharge.  Follow-up with your primary care provider in 1 to 2 weeks.  Check blood work at that time.  Seek medical attention for worsening symptoms.  Heart medications on hold at this time and will need to be resumed if ok in the next visit.  Dose of insulin has been decreased to 10 units at nighttime.   Discharge wound care:   Complete by: As directed    Sacral foam to nonintact wounds.  Change every three days and PRN soilage.  Barrier cream to buttocks each shift and PRN soilage.  Silicone 3 cm x 3 cm foam dressings to wounds on anterior lower leg. Change every three days and PRN soilage.   Increase activity slowly   Complete by: As directed       Allergies as of 06/30/2022   No Known Allergies      Medication List     STOP taking these medications    Jardiance 10 MG Tabs tablet Generic drug: empagliflozin   lisinopril 2.5 MG tablet Commonly known as: ZESTRIL   metoprolol succinate 25 MG 24 hr tablet Commonly known as: TOPROL-XL   omeprazole 20 MG tablet Commonly known as: PRILOSEC OTC       TAKE these medications    acetaminophen 325 MG tablet Commonly known as: TYLENOL Take 2 tablets (650 mg total) by mouth every 6 (six) hours as  needed for mild pain (or Fever >/= 101).   atorvastatin 80 MG tablet Commonly known as: LIPITOR Take 80 mg by mouth daily.   clopidogrel 75 MG tablet Commonly known as: PLAVIX Take 1 tablet (75 mg total) by mouth daily.   diclofenac Sodium 1 % Gel Commonly known as: VOLTAREN Apply 2 g topically 2 (two) times daily as needed (pain).   Eliquis 5 MG Tabs tablet Generic drug: apixaban Take 1 tablet (5 mg total) by mouth 2 (two) times daily.   finasteride 5 MG tablet Commonly known as: PROSCAR Take 1 tablet (5 mg total) by mouth daily.   gabapentin 300 MG capsule Commonly known as: NEURONTIN Take 300 mg by mouth at bedtime.   Magnesium 500 MG Tabs Take 500 mg by mouth at bedtime.   metFORMIN 500 MG tablet Commonly known as: GLUCOPHAGE Take 1,000 mg by mouth 2 (two) times daily with a meal.   pantoprazole 40 MG tablet Commonly known as: PROTONIX Take 1 tablet (40 mg total) by mouth daily.   potassium chloride SA 20 MEQ tablet Commonly known as: KLOR-CON M Take 20 mEq by mouth daily.   Tyler Aas FlexTouch 100 UNIT/ML FlexTouch Pen Generic drug: insulin degludec Inject 10 Units into the skin at bedtime. What changed:  medication strength how much to take               Discharge Care Instructions  (From admission, onward)  Start     Ordered   06/30/22 0000  Discharge wound care:       Comments: Sacral foam to nonintact wounds.  Change every three days and PRN soilage.  Barrier cream to buttocks each shift and PRN soilage.  Silicone 3 cm x 3 cm foam dressings to wounds on anterior lower leg. Change every three days and PRN soilage.   06/30/22 [facility] Follow up.   Specialty: Home Health Services Why: Home health has been arranged. They will contact you to schedule apt within 48 hours post discharge. Contact information: [place]  [address] [phone]         Darreld Mclean, PA-C Follow up.   Specialties: Physician Assistant, Cardiology Why: '@9'$ :15am for hospital follow up with Dr. Lysbeth Penner PA Contact information: [address] [place] [address] [phone]         Clovia Cuff, MD Follow up in 1 week(s).   Specialty: Internal Medicine Contact information: [address] [place] [address] [phone]                  Time coordinating discharge: 39 minutes  Signed:  Belvin Gauss  [facility] 06/30/2022, 9:51 AM

## 2022-07-04 DIAGNOSIS — R031 Nonspecific low blood-pressure reading: Secondary | ICD-10-CM | POA: Diagnosis present

## 2022-07-19 NOTE — Progress Notes (Deleted)
Cardiology Office Note:    Date:  07/19/2022   ID:  Peter Yang, DOB [birth year], MRN [id number]  PCP:  Clovia Cuff, MD  Cardiologist:  None  Electrophysiologist:  None   Referring MD: Clovia Cuff, MD   Chief Complaint: hospital follow-up of NSTEMI  History of Present Illness:    Peter Yang is a 66 y.o. 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 Johney Frame and presents today for hospital follow-up of NSTEMI.  Patient was previously followed by Dr. Maylene Roes at [facility] for CAD. He had a LHC in 01/2022 at [facility] which showed severe multivessel CAD including CTO of ostial LCX (with faint collaterals) and a heavily calcified RCA with a 99% stenosis of proximal stenosis and 80% stenosis in mid vessel. He underwent PTCA and PCI with DES to the proximal RCA lesions. DES was attempted of the mid RCA lesion as well but wire was unable to be crossed. Echo at that time showed LVEF of 35-40% with global hypokinesis and possible mild lateral hypokinesis.   He was recently admitted to [facility] from 06/15/2022 to 06/18/2022 for NSTEMI and hypoglycemia after being found unresponsive during a welfare check. His blood sugar was 37 upon arrival of first responders and there was reportedly urine and stool everywhere. He was also found to be in new onset atrial fibrillation on arrival to the ED. High-sensitivity troponin peaked at 6,096. Echo showed LVEF of 40-45% with moderate hypokinesis of basal-mid inferior  wall and inferolaterals wall as well as mildly reduced RV, severe left atrial enlargement, and moderate MR. He underwent left cardiac catheterization at that time which showed severe multivessel CAD with severe calcification similar to cath at [place]. Case was reviewed with interventional team and he was again not felt to be a good candidate for PCI or CABG. His Brilinta was stopped and he was switched to Plavix given need for DOAC for atrial fibrillation.  He was readmitted from 06/25/2022 to 06/30/2022 again for hypoglycemia with blood sugar in the 30s and altered mental status. He was borderline hypotensive on arrival with elevated WBC and lactate. Chest x-ray showed right perihilar infiltrates. He was admitted for for further management of hypoglycemia and severe sepsis secondary to HCAP and possible aspiration. He was also noted to have gross hematuria and positive hemoccult on admission. High-sensitivity troponin was again elevated at 7,765 so Cardiology was consulted and felt like this was likely due to demand ischemia. Antianginal therapy was limited by soft BP. Cardiology recommended continuing Plavix given PCI in 01/2022 but stated Eliquis good be held if needed. However, hemoglobin was stable and he was able to be discharge on both Plavix and Eliquis.  Shortly after discharge, he was admitted to [facility] from 07/02/2022 to 07/08/2022 after presenting for worsening shortness of breath. Chest CT showed moderate bilateral pleural effusion and increasing left > right basilar airspace opacities.  He was admitted for further management of HCAP. Repeat Echo was ordered and showed LVEF of 40-45% with inferior  wall akinesis and mild LVH as well as mild to moderate MR and mild TR. Cardiology was consulted and did not feel like there was a significant difference compared to prior study and did not recommend any invasive work-up. Patient was discharged to [facility].   Patient presents today for  follow-up. ***  CAD with Recent NSTEMI Patient has a history of severe multivessel CAD including CTO or RCA and LCX. He underwent PCI with DES to proximal RCA in 01/2022 at [facility]. He was recently admitted in 05/2022 for severe hypoglycemia and NSTEMI. Repeat cath at that time showed severe multivessel CAD with CTO of proximal to mid RCA including occlusion of distal segment of previously placed stent and CTO of LCX. He was not felt to be a candidate for PCI or CABG. Medical therapy was recommended. - No chest pain.  - Antianginal therapy has been limited by soft BP. *** - Continue Plavix '75mg'$  daily. *** - Continue high-intensity statin.  Ischemic Cardiomyopathy HFrEF Last Echo on 07/03/2022 at [facility] showed LVEF of 40-45% with inferior wall akinesis and mild LVH as well as mild to moderate MR and mild TR. - Euvolemic one exam. *** - GDMT has been limited by soft BP. *** - Discussed importance of daily weights and sodium/fluid restrictions.  Paroxysmal Atrial Fibrillation Noted to be in atrial fibrillation during admission in 05/2022. - Maintaining sinus rhythm. *** - *** - Continue Eliquis '5mg'$  twice daily.   Ascending Thoracic Aortic Aneurysm Echo in 05/2022 showed mild dilatation of the aortic root measuring 42 mm and moderate dilation of the ascending aorta measuring 47 mm. - Will order chest CTA for better evaluation. 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  - Followed by Vascular Surgery at [facility].  Hypertension History of hypertension listed in chart but has had more problems with soft BP lately.  - *** - Not on any medications. ***  Dyslipidemia Lipid panel in 05/2022: Total Cholesterol 79, Triglycerides 92, HDL 36, LDL 25.  - Continue Lipitor '80mg'$  daily.  Type 2 Diabetes Mellitus He has had multiple admission over the last 2 months for severe hypoglycemia. Hemoglobin A1c  7.7% on 07/02/2022. - On Metformin and Insulin. - Management per PCP.  Past Medical History:  Diagnosis Date   BPH with obstruction/lower urinary tract symptoms 02/02/2018   Controlled type 2 diabetes mellitus with hyperglycemia, with long-term current use of insulin ([place]) 06/28/2021   Diabetic polyneuropathy associated with type 2 diabetes mellitus ([place]) 07/28/2021   Essential hypertension 01/25/2016   Hypertriglyceridemia    Mixed diabetic hyperlipidemia associated with type 2 diabetes mellitus ([place]) 07/28/2021   OSA on CPAP    TIA (transient ischemic attack) 2012    Past Surgical History:  Procedure Laterality Date   BELOW KNEE LEG AMPUTATION Left 2022   CORONARY ANGIOPLASTY WITH STENT PLACEMENT  01/2022   DES RCA   LEFT HEART CATH AND CORONARY ANGIOGRAPHY N/A 06/17/2022   Procedure: LEFT HEART CATH AND CORONARY ANGIOGRAPHY;  Surgeon: Nelva Bush, MD;  Location: [place] [facility];  Service: Cardiovascular;  Laterality: N/A;   ROTATOR CUFF REPAIR Left     Current Medications: No outpatient medications have been marked as taking for the 07/24/22 encounter (Appointment) with Darreld Mclean, PA-C.     Allergies:   Patient has no known allergies.   Social History   Socioeconomic History   Marital status:  Single    Spouse name: Not on file   Number of children: Not on file   Years of education: Not on file   Highest education level: Not on file  Occupational History   Not on file  Tobacco Use   Smoking status: Never   Smokeless tobacco: Never  Substance and Sexual Activity   Alcohol use: Never   Drug use: Never   Sexual activity: Not on file  Other Topics Concern   Not on file  Social History Narrative   Not on file   Social Determinants of Health   Financial Resource Strain: Not on file  Food Insecurity: Not on file  Transportation Needs: Not on file  Physical Activity: Not on file  Stress: Not on file  Social Connections: Not on file     Family  History: [name] family history includes Cancer in his father and sister; Diabetes in his brother and mother; Heart attack in his sister; Heart disease in his mother; Hypertension in his mother.  ROS:   Please see the history of present illness.     EKGs/Labs/Other Studies Reviewed:    The following studies were reviewed:  Echocardiogram 06/15/2022: Impressions: 1. Left ventricular ejection fraction, by estimation, is 40 to 45%. The  left ventricle has mildly decreased function. The left ventricle  demonstrates regional wall motion abnormalities (see scoring  diagram/findings for description). There is mild left  ventricular hypertrophy. Left ventricular diastolic function could not be  evaluated. There is moderate hypokinesis of the left ventricular,  basal-mid inferior wall and inferolateral wall.   2. Right ventricular systolic function is mildly reduced. The right  ventricular size is normal.   3. Left atrial size was severely dilated.   4. The mitral valve is abnormal. Moderate mitral valve regurgitation.   5. The aortic valve is tricuspid. Aortic valve regurgitation is not  visualized.   6. Aortic dilatation noted. There is moderate dilatation of the ascending  aorta, measuring 47 mm. There is mild dilatation of the aortic root,  measuring 42 mm.   7. The inferior vena cava is normal in size with greater than 50%  respiratory variability, suggesting right atrial pressure of 3 mmHg.   Comparison(s): Changes from prior study are noted. 02/11/2022 [facility]): LVEF 35-40%, global hypokinesis worse laterally. _______________  Left Cardiac Catheterization 06/17/2022: Conclusions: Severe multivessel coronary artery disease with severe calcification, as detailed below, including 50% mid LAD stenosis, 95% stenosis of dominant branch of large D1 and chronic total occlusion of lateral branch of D1, chronic total occlusion of LCx, and chronic total occlusion of ostial through mid RCA  (including stent placed in 01/2022) with left-to-right collaterals. Normal left ventricular filling pressure (LVEDP 10 mmHg).   Recommendations: Consider viability study with particular attention to the inferior wall.  If RCA territory is viable, cardiac surgery consultation for CABG should be considered.  If RCA territory is not viable (or the patient is not a candidate for CABG), medical therapy versus PCI to dominant branch of D1 (particularly if the patient reports recurrent angina) should be considered. Hold ticagrelor pending further workup and possible cardiac surgery consultation.  If the patient does not undergo CABG, I would favor 12 months of clopidogrel + DOAC therapy in the setting of new atrial fibrillation and NSTEMI. Aggressive secondary prevention of coronary artery disease.  Diagnostic Dominance: Right  _______________  Echocardiogram 07/03/2022 [facility]): Summary: There is basal LV inferior wall akinesis  LV ejection fraction = 40-45%.  Mild left  ventricular hypertrophy  The left atrium is borderline dilated.  There is mild to moderate mitral regurgitation.  There is mild tricuspid regurgitation.  There is no pericardial effusion.  Compared to the last study the inferior wall appears to be somewhat  more hypokinetic.   EKG:  EKG ordered today. EKG personally reviewed and demonstrates ***.  Recent Labs: 07/28/2021: TSH 2.045 06/27/2022: ALT 31 06/29/2022: BUN 26; Creatinine, Ser 1.20; Hemoglobin 10.4; Magnesium 2.2; Platelets 128; Potassium 4.5; Sodium 136  Recent Lipid Panel    Component Value Date/Time   CHOL 79 06/16/2022 0330   TRIG 92 06/16/2022 0330   HDL 36 (L) 06/16/2022 0330   CHOLHDL 2.2 06/16/2022 0330   VLDL 18 06/16/2022 0330   LDLCALC 25 06/16/2022 0330    Physical Exam:    Vital Signs: There were no vitals taken for this visit.    Wt Readings from Last 3 Encounters:  06/30/22 224 lb 10.4 oz (101.9 kg)  06/18/22 210 lb 1.6 oz (95.3 kg)   07/28/21 208 lb (94.3 kg)     General: 66 y.o. male in no acute distress. HEENT: Normocephalic and atraumatic. Sclera clear. EOMs intact. Neck: Supple. No carotid bruits. No JVD. Heart: *** RRR. Distinct S1 and S2. No murmurs, gallops, or rubs. Radial and distal pedal pulses 2+ and equal bilaterally. Lungs: No increased work of breathing. Clear to ausculation bilaterally. No wheezes, rhonchi, or rales.  Abdomen: Soft, non-distended, and non-tender to palpation. Bowel sounds present in all 4 quadrants.  MSK: Normal strength and tone for age. *** Extremities: No lower extremity edema.    Skin: Warm and dry. Neuro: Alert and oriented x3. No focal deficits. Psych: Normal affect. Responds appropriately.   Assessment:    No diagnosis found.  Plan:     Disposition: Follow up in ***   Medication Adjustments/Labs and Tests Ordered: Current medicines are reviewed at length with the patient today.  Concerns regarding medicines are outlined above.  No orders of the defined types were placed in this encounter.  No orders of the defined types were placed in this encounter.   There are no Patient Instructions on file for this visit.   Signed, Darreld Mclean, PA-C  07/19/2022 10:39 AM    [facility]

## 2022-07-23 ENCOUNTER — Encounter: Payer: Self-pay | Admitting: Student

## 2022-07-23 DIAGNOSIS — I255 Ischemic cardiomyopathy: Secondary | ICD-10-CM | POA: Insufficient documentation

## 2022-07-23 DIAGNOSIS — I7121 Aneurysm of the ascending aorta, without rupture: Secondary | ICD-10-CM | POA: Insufficient documentation

## 2022-07-24 ENCOUNTER — Ambulatory Visit: Payer: BC Managed Care – PPO | Attending: Student | Admitting: Student

## 2022-07-24 DIAGNOSIS — I255 Ischemic cardiomyopathy: Secondary | ICD-10-CM

## 2022-07-24 DIAGNOSIS — I7121 Aneurysm of the ascending aorta, without rupture: Secondary | ICD-10-CM

## 2022-08-02 ENCOUNTER — Inpatient Hospital Stay (HOSPITAL_COMMUNITY)
Admission: EM | Admit: 2022-08-02 | Discharge: 2022-08-17 | DRG: 698 | Disposition: A | Discharge status: Skilled Nursing Facility | Payer: Medicare Other | Attending: Family Medicine | Admitting: Family Medicine

## 2022-08-02 ENCOUNTER — Emergency Department (HOSPITAL_COMMUNITY): Payer: Medicare Other

## 2022-08-02 ENCOUNTER — Encounter (HOSPITAL_COMMUNITY): Payer: Self-pay

## 2022-08-02 ENCOUNTER — Ambulatory Visit (HOSPITAL_COMMUNITY): Payer: Medicare Other

## 2022-08-02 DIAGNOSIS — N138 Other obstructive and reflux uropathy: Secondary | ICD-10-CM | POA: Diagnosis present

## 2022-08-02 DIAGNOSIS — Z833 Family history of diabetes mellitus: Secondary | ICD-10-CM

## 2022-08-02 DIAGNOSIS — E1165 Type 2 diabetes mellitus with hyperglycemia: Secondary | ICD-10-CM | POA: Diagnosis not present

## 2022-08-02 DIAGNOSIS — E440 Moderate protein-calorie malnutrition: Secondary | ICD-10-CM | POA: Diagnosis present

## 2022-08-02 DIAGNOSIS — N390 Urinary tract infection, site not specified: Secondary | ICD-10-CM | POA: Diagnosis present

## 2022-08-02 DIAGNOSIS — I5023 Acute on chronic systolic (congestive) heart failure: Secondary | ICD-10-CM | POA: Diagnosis present

## 2022-08-02 DIAGNOSIS — R4182 Altered mental status, unspecified: Secondary | ICD-10-CM

## 2022-08-02 DIAGNOSIS — I340 Nonrheumatic mitral (valve) insufficiency: Secondary | ICD-10-CM | POA: Diagnosis present

## 2022-08-02 DIAGNOSIS — Z794 Long term (current) use of insulin: Secondary | ICD-10-CM | POA: Diagnosis not present

## 2022-08-02 DIAGNOSIS — L89152 Pressure ulcer of sacral region, stage 2: Secondary | ICD-10-CM | POA: Diagnosis present

## 2022-08-02 DIAGNOSIS — R031 Nonspecific low blood-pressure reading: Secondary | ICD-10-CM | POA: Diagnosis present

## 2022-08-02 DIAGNOSIS — J90 Pleural effusion, not elsewhere classified: Secondary | ICD-10-CM

## 2022-08-02 DIAGNOSIS — Z741 Need for assistance with personal care: Secondary | ICD-10-CM | POA: Diagnosis not present

## 2022-08-02 DIAGNOSIS — E11649 Type 2 diabetes mellitus with hypoglycemia without coma: Secondary | ICD-10-CM | POA: Diagnosis present

## 2022-08-02 DIAGNOSIS — Z89512 Acquired absence of left leg below knee: Secondary | ICD-10-CM

## 2022-08-02 DIAGNOSIS — U071 COVID-19: Secondary | ICD-10-CM | POA: Diagnosis present

## 2022-08-02 DIAGNOSIS — I480 Paroxysmal atrial fibrillation: Secondary | ICD-10-CM | POA: Diagnosis present

## 2022-08-02 DIAGNOSIS — Z6826 Body mass index (BMI) 26.0-26.9, adult: Secondary | ICD-10-CM

## 2022-08-02 DIAGNOSIS — I739 Peripheral vascular disease, unspecified: Secondary | ICD-10-CM | POA: Diagnosis present

## 2022-08-02 DIAGNOSIS — L89611 Pressure ulcer of right heel, stage 1: Secondary | ICD-10-CM | POA: Diagnosis present

## 2022-08-02 DIAGNOSIS — Z751 Person awaiting admission to adequate facility elsewhere: Secondary | ICD-10-CM

## 2022-08-02 DIAGNOSIS — G9341 Metabolic encephalopathy: Secondary | ICD-10-CM | POA: Diagnosis present

## 2022-08-02 DIAGNOSIS — I110 Hypertensive heart disease with heart failure: Secondary | ICD-10-CM | POA: Diagnosis present

## 2022-08-02 DIAGNOSIS — N3001 Acute cystitis with hematuria: Secondary | ICD-10-CM | POA: Diagnosis not present

## 2022-08-02 DIAGNOSIS — Z79899 Other long term (current) drug therapy: Secondary | ICD-10-CM

## 2022-08-02 DIAGNOSIS — Z8249 Family history of ischemic heart disease and other diseases of the circulatory system: Secondary | ICD-10-CM

## 2022-08-02 DIAGNOSIS — E871 Hypo-osmolality and hyponatremia: Secondary | ICD-10-CM | POA: Diagnosis present

## 2022-08-02 DIAGNOSIS — N401 Enlarged prostate with lower urinary tract symptoms: Secondary | ICD-10-CM | POA: Diagnosis not present

## 2022-08-02 DIAGNOSIS — I788 Other diseases of capillaries: Secondary | ICD-10-CM | POA: Diagnosis not present

## 2022-08-02 DIAGNOSIS — R319 Hematuria, unspecified: Secondary | ICD-10-CM

## 2022-08-02 DIAGNOSIS — Z7409 Other reduced mobility: Secondary | ICD-10-CM | POA: Diagnosis present

## 2022-08-02 DIAGNOSIS — I252 Old myocardial infarction: Secondary | ICD-10-CM

## 2022-08-02 DIAGNOSIS — Z8619 Personal history of other infectious and parasitic diseases: Secondary | ICD-10-CM

## 2022-08-02 DIAGNOSIS — Y846 Urinary catheterization as the cause of abnormal reaction of the patient, or of later complication, without mention of misadventure at the time of the procedure: Secondary | ICD-10-CM | POA: Diagnosis present

## 2022-08-02 DIAGNOSIS — T83511A Infection and inflammatory reaction due to indwelling urethral catheter, initial encounter: Principal | ICD-10-CM | POA: Diagnosis present

## 2022-08-02 DIAGNOSIS — R209 Unspecified disturbances of skin sensation: Secondary | ICD-10-CM

## 2022-08-02 DIAGNOSIS — Z7984 Long term (current) use of oral hypoglycemic drugs: Secondary | ICD-10-CM

## 2022-08-02 DIAGNOSIS — R269 Unspecified abnormalities of gait and mobility: Secondary | ICD-10-CM | POA: Diagnosis present

## 2022-08-02 DIAGNOSIS — E162 Hypoglycemia, unspecified: Secondary | ICD-10-CM

## 2022-08-02 DIAGNOSIS — R509 Fever, unspecified: Secondary | ICD-10-CM | POA: Diagnosis present

## 2022-08-02 DIAGNOSIS — E785 Hyperlipidemia, unspecified: Secondary | ICD-10-CM | POA: Diagnosis present

## 2022-08-02 DIAGNOSIS — T83518A Infection and inflammatory reaction due to other urinary catheter, initial encounter: Secondary | ICD-10-CM | POA: Diagnosis present

## 2022-08-02 DIAGNOSIS — Z955 Presence of coronary angioplasty implant and graft: Secondary | ICD-10-CM

## 2022-08-02 DIAGNOSIS — Z66 Do not resuscitate: Secondary | ICD-10-CM | POA: Diagnosis present

## 2022-08-02 DIAGNOSIS — N179 Acute kidney failure, unspecified: Secondary | ICD-10-CM | POA: Diagnosis not present

## 2022-08-02 DIAGNOSIS — R339 Retention of urine, unspecified: Secondary | ICD-10-CM | POA: Diagnosis present

## 2022-08-02 DIAGNOSIS — I959 Hypotension, unspecified: Secondary | ICD-10-CM | POA: Diagnosis present

## 2022-08-02 DIAGNOSIS — I712 Thoracic aortic aneurysm, without rupture, unspecified: Secondary | ICD-10-CM | POA: Diagnosis present

## 2022-08-02 DIAGNOSIS — Z7901 Long term (current) use of anticoagulants: Secondary | ICD-10-CM

## 2022-08-02 DIAGNOSIS — Z8546 Personal history of malignant neoplasm of prostate: Secondary | ICD-10-CM

## 2022-08-02 DIAGNOSIS — I5022 Chronic systolic (congestive) heart failure: Secondary | ICD-10-CM | POA: Diagnosis present

## 2022-08-02 DIAGNOSIS — E1151 Type 2 diabetes mellitus with diabetic peripheral angiopathy without gangrene: Secondary | ICD-10-CM | POA: Diagnosis present

## 2022-08-02 DIAGNOSIS — R310 Gross hematuria: Secondary | ICD-10-CM | POA: Diagnosis present

## 2022-08-02 DIAGNOSIS — Z87898 Personal history of other specified conditions: Secondary | ICD-10-CM

## 2022-08-02 DIAGNOSIS — A419 Sepsis, unspecified organism: Secondary | ICD-10-CM | POA: Diagnosis present

## 2022-08-02 DIAGNOSIS — R7989 Other specified abnormal findings of blood chemistry: Secondary | ICD-10-CM | POA: Diagnosis present

## 2022-08-02 DIAGNOSIS — I2510 Atherosclerotic heart disease of native coronary artery without angina pectoris: Secondary | ICD-10-CM | POA: Diagnosis present

## 2022-08-02 DIAGNOSIS — I255 Ischemic cardiomyopathy: Secondary | ICD-10-CM | POA: Diagnosis present

## 2022-08-02 DIAGNOSIS — G4733 Obstructive sleep apnea (adult) (pediatric): Secondary | ICD-10-CM | POA: Diagnosis present

## 2022-08-02 DIAGNOSIS — Z8673 Personal history of transient ischemic attack (TIA), and cerebral infarction without residual deficits: Secondary | ICD-10-CM

## 2022-08-02 LAB — TROPONIN I (HIGH SENSITIVITY): Troponin I (High Sensitivity): 435 ng/L (ref <18)

## 2022-08-02 LAB — COMPREHENSIVE METABOLIC PANEL
ALT: 20 U/L (ref 0–44)
AST: 39 U/L (ref 15–41)
Albumin: 3 g/dL — ABNORMAL LOW (ref 3.5–5.0)
Alkaline Phosphatase: 84 U/L (ref 38–126)
Anion gap: 12 (ref 5–15)
BUN: 23 mg/dL (ref 8–23)
CO2: 16 mmol/L — ABNORMAL LOW (ref 22–32)
Calcium: 8.8 mg/dL — ABNORMAL LOW (ref 8.9–10.3)
Chloride: 111 mmol/L (ref 98–111)
Creatinine, Ser: 1.04 mg/dL (ref 0.61–1.24)
GFR, Estimated: >60 mL/min (ref >60)
Glucose, Bld: 60 mg/dL — ABNORMAL LOW (ref 70–99)
Potassium: 4.5 mmol/L (ref 3.5–5.1)
Sodium: 139 mmol/L (ref 135–145)
Total Bilirubin: 0.9 mg/dL (ref 0.3–1.2)
Total Protein: 6.6 g/dL (ref 6.5–8.1)

## 2022-08-02 LAB — RAPID URINE DRUG SCREEN, HOSP PERFORMED
Amphetamines: NOT DETECTED
Barbiturates: NOT DETECTED
Benzodiazepines: NOT DETECTED
Cocaine: NOT DETECTED
Opiates: NOT DETECTED
Tetrahydrocannabinol: NOT DETECTED

## 2022-08-02 LAB — URINALYSIS, ROUTINE W REFLEX MICROSCOPIC
Bacteria, UA: NONE SEEN
Bilirubin Urine: NEGATIVE
Glucose, UA: >=500 mg/dL — AB
Ketones, ur: 5 mg/dL — AB
Nitrite: NEGATIVE
Protein, ur: 100 mg/dL — AB
RBC / HPF: >50 RBC/hpf — ABNORMAL HIGH (ref 0–5)
Specific Gravity, Urine: 1.017 (ref 1.005–1.030)
WBC, UA: >50 WBC/hpf — ABNORMAL HIGH (ref 0–5)
pH: 5 (ref 5.0–8.0)

## 2022-08-02 LAB — PROTIME-INR
INR: 1.2 (ref 0.8–1.2)
Prothrombin Time: 14.9 seconds (ref 11.4–15.2)

## 2022-08-02 LAB — TYPE AND SCREEN
ABO/RH(D): A POS
Antibody Screen: NEGATIVE

## 2022-08-02 LAB — RESP PANEL BY RT-PCR (FLU A&B, COVID) ARPGX2
Influenza A by PCR: NEGATIVE
Influenza B by PCR: NEGATIVE
SARS Coronavirus 2 by RT PCR: POSITIVE — AB

## 2022-08-02 LAB — LACTIC ACID, PLASMA
Lactic Acid, Venous: 2.2 mmol/L (ref 0.5–1.9)
Lactic Acid, Venous: 1.9 mmol/L (ref 0.5–1.9)

## 2022-08-02 LAB — CBC WITH DIFFERENTIAL/PLATELET
Abs Immature Granulocytes: 0.11 10*3/uL — ABNORMAL HIGH (ref 0.00–0.07)
Basophils Absolute: 0.1 10*3/uL (ref 0.0–0.1)
Basophils Relative: 0 %
Eosinophils Absolute: 0 10*3/uL (ref 0.0–0.5)
Eosinophils Relative: 0 %
HCT: 42.6 % (ref 39.0–52.0)
Hemoglobin: 13.1 g/dL (ref 13.0–17.0)
Immature Granulocytes: 1 %
Lymphocytes Relative: 10 %
Lymphs Abs: 1.8 10*3/uL (ref 0.7–4.0)
MCH: 29.6 pg (ref 26.0–34.0)
MCHC: 30.8 g/dL (ref 30.0–36.0)
MCV: 96.2 fL (ref 80.0–100.0)
Monocytes Absolute: 0.8 10*3/uL (ref 0.1–1.0)
Monocytes Relative: 5 %
Neutro Abs: 15.5 10*3/uL — ABNORMAL HIGH (ref 1.7–7.7)
Neutrophils Relative %: 84 %
Platelets: 351 10*3/uL (ref 150–400)
RBC: 4.43 MIL/uL (ref 4.22–5.81)
RDW: 14.9 % (ref 11.5–15.5)
WBC: 18.3 10*3/uL — ABNORMAL HIGH (ref 4.0–10.5)
nRBC: 0 % (ref 0.0–0.2)

## 2022-08-02 LAB — CBG MONITORING, ED
Glucose-Capillary: 72 mg/dL (ref 70–99)
Glucose-Capillary: 103 mg/dL — ABNORMAL HIGH (ref 70–99)
Glucose-Capillary: 37 mg/dL — CL (ref 70–99)
Glucose-Capillary: 64 mg/dL — ABNORMAL LOW (ref 70–99)
Glucose-Capillary: 59 mg/dL — ABNORMAL LOW (ref 70–99)
Glucose-Capillary: 65 mg/dL — ABNORMAL LOW (ref 70–99)

## 2022-08-02 LAB — AMMONIA: Ammonia: 22 umol/L (ref 9–35)

## 2022-08-02 LAB — CK: Total CK: 161 U/L (ref 49–397)

## 2022-08-02 LAB — MAGNESIUM: Magnesium: 1.5 mg/dL — ABNORMAL LOW (ref 1.7–2.4)

## 2022-08-02 LAB — APTT: aPTT: 32 seconds (ref 24–36)

## 2022-08-02 LAB — ETHANOL: Alcohol, Ethyl (B): <10 mg/dL (ref <10)

## 2022-08-02 MED ORDER — MAGNESIUM SULFATE IN D5W 1-5 GM/100ML-% IV SOLN
1.0000 g | Freq: Once | INTRAVENOUS | Status: AC
  Administered 2022-08-02: 1 g via INTRAVENOUS
  Filled 2022-08-02: qty 100

## 2022-08-02 MED ORDER — DEXTROSE 10 % IV SOLN: INTRAVENOUS | Status: DC

## 2022-08-02 MED ORDER — SODIUM CHLORIDE 0.9 % IV SOLN
1.0000 g | Freq: Once | INTRAVENOUS | Status: AC
  Administered 2022-08-02: 1 g via INTRAVENOUS
  Filled 2022-08-02: qty 20

## 2022-08-02 MED ORDER — ACETAMINOPHEN 650 MG RE SUPP
650.0000 mg | Freq: Once | RECTAL | Status: AC
  Administered 2022-08-02: 650 mg via RECTAL
  Filled 2022-08-02: qty 1

## 2022-08-02 MED ORDER — HEPARIN (PORCINE) 25000 UT/250ML-% IV SOLN
1800.0000 [IU]/h | INTRAVENOUS | Status: DC
  Administered 2022-08-02: 1500 [IU]/h via INTRAVENOUS
  Filled 2022-08-02: qty 250

## 2022-08-02 MED ORDER — LACTATED RINGERS IV BOLUS
1000.0000 mL | Freq: Once | INTRAVENOUS | Status: AC
  Administered 2022-08-02: 1000 mL via INTRAVENOUS

## 2022-08-02 MED ORDER — DEXTROSE 50 % IV SOLN
INTRAVENOUS | Status: AC
  Administered 2022-08-02: 50 mL
  Filled 2022-08-02: qty 50

## 2022-08-02 NOTE — ED Provider Notes (Signed)
Care of patient handed off to me by Genevive Bi, PA-C at change of shift.  Please see his note for initiation of care.  Briefly this is a 66 year old male who presents to the emergency department with altered mental status, hypoglycemia.  Per EMS a neighbor called as they had been unable to contact the patient for 2 days.  He was found unresponsive in his bedroom with initial blood glucose of 36.  He was given '1mg'$  of glucagon by EMS.  Physical Exam  BP 123/61   Pulse 100   Temp (!) 100.4 F (38 C) (Rectal)   Resp 14   SpO2 99%   Physical Exam Vitals and nursing note reviewed. Exam conducted with a chaperone present.  Constitutional:      General: He is in acute distress.     Appearance: He is ill-appearing. He is not diaphoretic.  HENT:     Head: Normocephalic and atraumatic.     Mouth/Throat:     Mouth: Mucous membranes are dry.  Eyes:     General: No scleral icterus.    Pupils: Pupils are equal, round, and reactive to light.  Cardiovascular:     Rate and Rhythm: Tachycardia present. Rhythm irregularly irregular.     Pulses:          Radial pulses are 1+ on the right side and 1+ on the left side.       Dorsalis pedis pulses are detected w/ Doppler on the right side. Left dorsalis pedis pulse not accessible.        Left posterior tibial pulse not accessible.     Heart sounds: No murmur heard.    Comments: Right DP with doppler. The leg is cold with delayed capillary refill.  Pulmonary:     Effort: Pulmonary effort is normal. No respiratory distress.     Breath sounds: Examination of the right-lower field reveals decreased breath sounds. Examination of the left-lower field reveals decreased breath sounds. Decreased breath sounds present.  Abdominal:     General: Abdomen is protuberant. Bowel sounds are normal. There is no distension.     Palpations: Abdomen is soft.     Tenderness: There is no abdominal tenderness.  Feet:     Comments: Stage 1 R heel  Skin:    General:  Skin is warm and dry.     Capillary Refill: Capillary refill takes less than 2 seconds.     Comments: Stage II with center of wound progressing to stage III on sacrum  Neurological:     Mental Status: He is alert.     GCS: GCS eye subscore is 4. GCS verbal subscore is 1. GCS motor subscore is 4.     Cranial Nerves: No facial asymmetry.  Psychiatric:        Attention and Perception: He is inattentive.        Speech: He is noncommunicative.        Behavior: Behavior is slowed.        Cognition and Memory: Cognition is impaired.    Procedures  .Critical Care  Performed by: Mickie Hillier, PA-C Authorized by: Mickie Hillier, PA-C   Critical care provider statement:    Critical care time (minutes):  40   Critical care time was exclusive of:  Separately billable procedures and treating other patients   Critical care was necessary to treat or prevent imminent or life-threatening deterioration of the following conditions:  Sepsis, dehydration and metabolic crisis   Critical care was  time spent personally by me on the following activities:  Blood draw for specimens, development of treatment plan with patient or surrogate, discussions with primary provider, discussions with consultants, evaluation of patient's response to treatment, examination of patient, interpretation of cardiac output measurements, obtaining history from patient or surrogate, review of old charts, re-evaluation of patient's condition, pulse oximetry, ordering and review of radiographic studies, ordering and review of laboratory studies and ordering and performing treatments and interventions   I assumed direction of critical care for this patient from another provider in my specialty: no     Care discussed with: admitting provider     ED Course / MDM   Clinical Course as of 08/02/22 1811  Fri Aug 02, 2022  1544 Patient repeat glucose 37- given D50. I have instructed nursing for glucose check in 30 minutes and then hourly  after that.  [LA]  1729 Hourly glucose 64. Increasing D10 to 100/hr [LA]    Clinical Course User Index [LA] Mickie Hillier, PA-C   Medical Decision Making Amount and/or Complexity of Data Reviewed Labs: ordered. Radiology: ordered. ECG/medicine tests: ordered.  Risk OTC drugs. Prescription drug management. Decision regarding hospitalization.   Previous PA initiated sepsis work-up as well as ordering a CT head and CT chest abdomen pelvis.  Initiated LR bolus as well as rectal Tylenol for low-grade temp.  I added on a troponin, CK, urine cx, ammonia, COVID/flu testing, hourly glucose checks. Ordered 1g merrem for sepsis coverage. Ordering arterial vascular study given PE findings. Will have nursing exchange foley catheter.  1544: On return from CT, glucose check 37. Given D50. Repeating in 30 minutes and then hourly as noted above. 1615: Glucose 103. Now withdrawing from pain. Continues to be alert.  Active problems requiring admission Altered mental status -Unclear etiology at this time.  Likely secondary to hypoglycemia versus urosepsis -CT head negative, CT pan scan was negative -UA with UTI, culture sent -Ammonia ordered   Hypoglycemia  - history of T2DM - unclear if he is taking medication at home or eating? Could be driven by active infection, critical illness - Glucose 36 x2 requiring D50. Ordered D10 infusion   Troponinemia Initial troponin 435, will need to trend.  Patient had NSTEMI in September with last troponin drawn 7700.  Would not initiate heparin at this time. EKG with A-fib, does appear to have some subtle depressions in the anterolateral leads. ?rate dependent  -Consulted cardiology   Sepsis  Urine vs HCAP. UA is positive, culture sent. CXR with pulm edema cannot exclude overriding infection  Started on Meropenem, Given IVF  Lactic 2.2 -> to be trended  Tylenol given for fever  COVID/flu pending   LE vascular  - RLE cool to touch. No cyanosis or  gangrene.  - Dopplered pulse but delayed cap refill - ABI vascular study ordered   Patient will need admission for the above-stated problems.  Consulted and spoke with family medicine who agrees to admit the patient.  Also consulted cardiology and awaiting their return phone call.  Will trend troponin until we receive their input.             Mickie Hillier, PA-C 08/02/22 1811    Audley Hose, MD 08/03/22 [phone]

## 2022-08-02 NOTE — Assessment & Plan Note (Deleted)
Hx of CAD and NSTEMI s/p 2 stents. Trop 800>634>949>447. EKG with some ST depression in V6, similar to previous. Hx of NSTEMI 1 month ago and April. Denies chest pain currently. - ASA '81mg'$  qd - plavix 75 mg daily (recommended DAPT for a year on last hospitalization)

## 2022-08-02 NOTE — Progress Notes (Signed)
ABI w/ TBI study completed.   Please see CV Proc for preliminary results.   Brittanyann Wittner, RDMS, RVT  

## 2022-08-02 NOTE — Assessment & Plan Note (Deleted)
Last echo 06/15/2022 with EF of 40-45% with LV with regional wall motion abnormality, LA severly dilated, mod MVR.  Does not appear to be in acute exacerbation currently. -Strict I/O's -Daily weights

## 2022-08-02 NOTE — Assessment & Plan Note (Deleted)
resolved. Likely multifactorial from hypoglycemia and underlying urinary tract infection. Passed SLP eval. A&Ox3 -CBGs qid

## 2022-08-02 NOTE — Progress Notes (Signed)
FMTS Brief Progress Note  S: In to check in on patient with Dr. Nita Sells.  Patient remains nonverbal at this time.  Awakens to verbal and tactile stimuli but does not follow commands, track, or respond appropriately.   O: BP 110/78   Pulse (!) 108   Temp 98.4 F (36.9 C) (Axillary)   Resp 16   Ht 6' 3.98" (1.93 m)   Wt 100.6 kg   SpO2 100%   BMI 27.01 kg/m    Gen: Nonverbal, awake with eyes open, unable to follow commands or answer questions CV: Irregularly irregular rhythm, tachycardic, no MRG, 2+ radial pulses bilaterally.  Right foot cold to touch, DP pulse appreciated, cap refill >2s. Pulm: CTAB, no increased WOB Ext: LLE BKA, RLE cold to touch   A/P:  Altered Mental Status Likely 2/2 sepsis vs hypoglycemia, continues to be nonverbal and unable to follow commands. Ammonia, CK wnl. -f/u UDS, ethanol, TSH, cultures -Continue to monitor CBGs -Continue to treat other medical conditions as below -PT/OT, SLP -Consider EEG/MRI if no improvement -AM CBC, CMP  Sepsis Hx  ESBL ecoli; UA consistent with UTI. Also with b/l pleural effusions and sacral ulcer. COVID positive. -s/p meropenem x1, re-order vs ID consult in AM -D10 fluids as below -Defer COVID tx, reevaluate in AM -WOC for sacral ulcer  T2DM, hypoglycemia Continues to have hypoglycemia, most recently 48.  -Increase D10 to 146m/hr (1.5 mIVF) -Continue to monitor CBGs, hold insulin and metformin  PAF Currently in afib, no RVR. -on heparin -consider IV rate control (e.g. cardizem) if RVR -f/u cardiology recommendations -K>4, Mg>2 -f/u TSH  Hypomagnesemia 1.5 -1g IV mag     Please review H&P for remainder of plan   - Orders reviewed. Labs for AM ordered, which was adjusted as needed.  - If condition changes, plan includes consider EEG/MRI.   MAugust Albino MD 08/02/2022, 9:27 PM PGY-1, [facility] Night Resident  Please page [phone] questions.

## 2022-08-02 NOTE — H&P (Cosign Needed Addendum)
Hospital Admission History and Physical Service Pager: [phone]  Patient name: Peter Yang Medical record number: [id number] Date of Birth: Apr 08, 1956 Age: 66 y.o. Gender: male  Primary Care Provider: Clovia Cuff, MD Consultants: None Code Status: FULL - has DNR band on hand but no official documentation with him, no contacts in chart Preferred Emergency Contact: None in chart, unable to answer  Chief Complaint: Altered mental status  Assessment and Plan: Peter Yang is a 66 y.o. male BPH, CAD-NSTEMI in September, T2DM, HTN, HLD, OSA on CPAP, PAF on Eliquis, Thoracic aortic aneurysm, TIA in 2012, CHF-EF 40% with consistent bilateral pleural effusion presenting, recent admission with hematuria/prostatitis/hx of prostate cancer where he was discharged on foley catheter 07/23/2022 (was given ertapenem for ESBL E coli and amoxicillin for Proteus intermediate to imipenem-discharged on Bactrim double strength BID for 6 weeks) with altered mental status. Differential for this patient's presentation of this includes AMS most likely from urosepsis as UA showed large leuks vs. Hypoglycemia (found down with CBG of 35). Further Ddx: Alcohol-obtain level, no hx per chart review Acidosis-none on VBG Arrhythmia/Cardiac-Afib-chronic not in RVR currently Endocrine-Glucose low, obtain TSH Electrolytes-wnl-monitor Infection-UTI + possible PNA Overdose/Opiates/Sedating meds-obtain UDS, only gabapentin sedating med on home meds Uremia-BUN 23 Trauma-Possible-CT Head neg Temperature-Febrile, f/u resp panel Thiamine (Wernicke's)-no hx of alcohol per chart review Insulin-has home med-low CBGs Psychiatric-more likely medical issue Stroke-possible, moving all extremities Seizure- Possible, no witnessed event, no hx of seizures  * Altered mental status Patient altered possibly from sepsis versus hypoglycemia. Found down at home by EMS (possibly down for 2 days, high risk for rhabdomyolysis). Patient  not oriented currently and unable to follow commands. CT head negative. -Admit to FMTS, progressive, attending Dr. McDiarmid -Follow-up ammonia, UDS, alcohol level -Blood and urine cultures -CBGs q4h (hold insulin) -Neurochecks -CRM -Continuous pulse ox -D10 @ 100 mL > consider switch to D5NS at 1.5 maintenance if CK elevated -AM CBC, CMP -PT/OT -SLP -Consider EEG/MRI if not improved after sepsis treatment -Re-assess code status when alert (DNR band on patient however no official documentation of DNR status with patient)   Sepsis ([place]) UA consistent with UTI > hx of ESBL ecoli.  CXR with L>R pleural effusion and possible superimposed infection. CT showed moderate sized bilateral pleural effusions with interval increase in size and interstitial opacity. Does have sacral ulcer documented in chart. -Meropenem (reorder tomorrow vs. ID consult tomorrow) -Vitals per floor protocol -D10 infusion -F/u BCx and UCx -F/u flu/covid -WOC for sacral ulcer -Document sacral ulcer in chart media  Type 2 diabetes mellitus with hypoglycemia (HCC) Initial CBG 36 per EMS and given glucagon x 1. Has had persistently low glucose in ED and was started on D10 infusion. Home medications of insulin degludec 10 units at bedtime, metformin 500 mg BID. -Hold home insulin and metformin -CBG q4h -D10 @ 100, consider increasing rate if persistently low -Hypoglycemia protocol  Elevated troponin Hx of CAD and NSTEMI s/p 2 stents. Initial troponin 435 in ED. EKG with some ST depression in V6, similar to previous. Hx of NSTEMI 1 month ago and April. ED consulted cardiology for this. -F/u repeat troponin -Cardiology consulted, appreciate recommendations -CRM   Paroxysmal atrial fibrillation ([place]) Rates have been in low 100s to 110s likely will improve with antibiotics and fluids. Home meds of eliquis 5 mg BID.  -Heparin per pharmacy as unable to tolerate PO -Consider IV rate control if RVR -Cardiology  consulted, appreciate recs -CRM -Keep K>4, Mag>2 -TSH  Chronic  systolic CHF (congestive heart failure) ([place]) Last echo 06/15/2022 with EF of 40-45% with LV with regional wall motion abnormality, LA severly dilated, mod MVR. Last admission 10/3 required nephrology consult for fluid overload. Patient does have very dry oral mucosa on exam and possible rhabdo pending CK. -Continue D10 @ 100 mL/hr > may increase if CK elevated -Strict I/O's -Daily weights -Cardiology consulted, appreciate recs  Cold right foot Cool RLE on exam although patient is uncovered. Vas u/s with noncompressible right lower extremity arteries (triphasic brachial and PT and biphasic DP) toe-brachial index is abnormal due to pt movement and altered status. Could not palpate pulse on exam. -Monitor -Consider vascular consult     Chronic: HLD-hold home atorvastatin in setting of AMS Sacral ulcer-WOC placed, document in media Physical deconditioning/L BKA-PT/OT placed, has been in SNF previously per last admission note  FEN/GI: NPO VTE Prophylaxis: Heparin per pharmacy  Disposition: Progressive  History of Present Illness:  Peter Yang is a 66 y.o. male presenting with altered mental status.  History per ED provider/EMS as patient does not have emergency contact in chart or anyone at bedside to corroborate history.  66 year old here after being found down at home.  Had not been seen in 2 days per neighbor who called EMS.  Initially had CBG of 36 in his bedroom and was given a milligram of glucagon.  Was recently discharged from [facility] where he had been admitted for hematuria and cystitis and urine culture at that time had grown ESBL resistant E. coli.  He does have a Foley catheter indwelling.  In the ED, CMP with overall normal electrolytes sodium of 139, potassium 4.5, creatinine stable at 1.04 around baseline. Lactic acid 2.2.  UA with amber-colored, turbid, high glucose, large hemoglobin, large  leukocytes, large WBC and RBC as well as budding yeast. WBC elevated to 18.3 with ANC of 15.5.  Hemoglobin of 13.1 around baseline.  PT 14.9, INR 1.2 PTT 32.  Preliminary blood culture collected and pending.  CBG has been fluctuating.  Troponin 435 (in past has been 7000s 1 month ago has NSTEMI), urine culture.  Respiratory panel in process.  CXR with moderate pulmonary edema and small left greater than right pleural effusions with superimposed infection possible.  CT head without any acute abnormalities.  CT abdomen pelvis with bilateral pleural effusions with interval increase in size, patchy opacity in both lungs, cholelithiasis, pancreatic atrophy with history of diabetes, CAD.  Vascular ultrasound with ABI on the right showed noncompressible right lower extremity arteries and TBI is abnormal.  Awaiting CK, ammonia, magnesium, second trop. Blood lost will have to redraw.   Was given 1 L LR bolus, D50 x1 and started on D10 infusion due to persistently low glucoses. Started on meropenem given patient's hx.  Review Of Systems: Per HPI with the following additions: Level 5 caveat patient was altered and unable to respond  Pertinent Past Medical History: BPH CAD-NSTEMI in September T2DM HTN HLD OSA on CPAP PAF on Eliquis Thoracic aortic aneurysm TIA in 2012 CHF-EF 40% with consistent bilateral pleural effusion  Pertinent Past Surgical History: BKA left 2022 Coronary angioplasty with stent placement in 2023 Left heart cath and coronary angiography 05/2022 Remainder reviewed in history tab.  Pertinent Social History: Tobacco use: Never per chart review Alcohol use: no known hx Other Substance use: Unknown Lives by himself at home  Pertinent Family History: Mother diabetes, hypertension, heart disease Father-cancer Sister,-heart attack, cancer Brother-diabetes Remainder reviewed in history tab.   Important  Outpatient Medications: Per chart review unable to confirm with  patient Eliquis 5 mg twice daily Lipitor 80 mg daily Finasteride 5 mg daily Gabapentin 300 mg at bedtime Degludec 10 units at bedtime Magnesium 500 mg at bedtime Metformin 500 mg twice daily Protonix 40 mg daily Potassium 20 meq once daily Remainder reviewed in medication history.   Objective: BP 110/78   Pulse (!) 108   Temp (!) 100.4 F (38 C) (Rectal)   Resp 16   Ht 6' 3.98" (1.93 m)   Wt 100.6 kg   SpO2 100%   BMI 27.01 kg/m  Exam: General: Patient is awake and eyes are open however is unable to follow commands and unable to answer any orientation questions and is not speaking Eyes: Open, reactive to light bilaterally pupils equal, lens different appearance on the right ENTM: Mucous membranes very dry, no nasal congestion Neck: ROM intact, no cervical adenopathy Cardiovascular: Irregularly irregular rate, 2+ radial pulses bilaterally, no murmurs rubs or gallops Respiratory: Clear to auscultation bilaterally, no wheezes rales or crackles, some diminished breath sounds in bases Gastrointestinal: Soft, nontender to palpation, not significantly distended, fluid wave neg MSK: Left lower extremity BKA present, right foot cold, pulses not palpable on my exam Derm: Scabbing present on right lower extremity, sacral ulcer not visualized but it is documented in chart Neuro: Pupils reactive, unable to follow commands   Labs:  CBC BMET  Recent Labs  Lab 08/02/22 1436  WBC 18.3*  HGB 13.1  HCT 42.6  PLT 351   Recent Labs  Lab 08/02/22 1436  NA 139  K 4.5  CL 111  CO2 16*  BUN 23  CREATININE 1.04  GLUCOSE 60*  CALCIUM 8.8*     Na 139, K 4.5, Cr 1.04 around baseline Lactic acid 2.2.  WBC elevated to 18.3 with ANC of 15.5.  Hemoglobin of 13.1 around baseline.  PT 14.9, INR 1.2 PTT 32.  Preliminary blood culture collected and pending.  CBG has been fluctuating and hypoglycemic 60, 72, 37, 103, 64.  Troponin 435 > pending  EKG: A-fib, regular regular, ST depression in V6  V5 similar to previous likely rate dependent   Imaging Studies Performed:  VAS [place] ABI WITH/WO TBI  Result Date: 08/02/2022  LOWER EXTREMITY DOPPLER STUDY Patient Name:  ERASMO VERTZ  Date of Exam:   08/02/2022 Medical Rec #: [id number]       Accession #:    [id number] Date of Birth: [birth year]        Patient Gender: M Patient Age:   74 years Exam Location:  [facility] Procedure:      VAS [place] ABI WITH/WO TBI Referring Phys: Theodis Blaze --------------------------------------------------------------------------------  Indications: Right foot cold, poor capillary refill, some skin changes left BKA High Risk Factors: Hyperlipidemia, Diabetes, no history of smoking, prior MI,                    coronary artery disease.  Limitations: Today's exam was limited due to Patient uncooperative, patient              movement, edema dorsum of right foot. Performing Technologist: Darlin Coco RDMS RVT  Examination Guidelines: A complete evaluation includes at minimum, Doppler waveform signals and systolic blood pressure reading at the level of bilateral brachial, anterior tibial, and posterior tibial arteries, when vessel segments are accessible. Bilateral testing is considered an integral part of a complete examination. Photoelectric Plethysmograph (PPG) waveforms and toe systolic pressure readings are included as  required and additional duplex testing as needed. Limited examinations for reoccurring indications may be performed as noted.  ABI Findings: +---------+------------------+-----+---------+---------------------------------+ Right    Rt Pressure (mmHg)IndexWaveform Comment                           +---------+------------------+-----+---------+---------------------------------+ Brachial 111                    triphasic                                  +---------+------------------+-----+---------+---------------------------------+ PTA      255               2.30 triphasic                                   +---------+------------------+-----+---------+---------------------------------+ DP       255               2.30 biphasic Audibly biphasic- edema limits                                             insonation                        +---------+------------------+-----+---------+---------------------------------+ Great Toe                       Abnormal Unable to obtain pressure due to                                           patient movement                  +---------+------------------+-----+---------+---------------------------------+ +--------+------------------+-----+---------+----------------------------------+ Left    Lt Pressure (mmHg)IndexWaveform Comment                            +--------+------------------+-----+---------+----------------------------------+ Brachial                       triphasicUnable to obtain pressure due to                                           IV/patient movement                +--------+------------------+-----+---------+----------------------------------+ PTA                                     BKA                                +--------+------------------+-----+---------+----------------------------------+ DP                                      BKA                                +--------+------------------+-----+---------+----------------------------------+  Arterial wall calcification precludes accurate ankle pressures and ABIs.  Summary: Right: Resting right ankle-brachial index indicates noncompressible right lower extremity arteries. The right toe-brachial index is abnormal. Today's examination was limited due to patient movement/altered mental status. Left: S/p left BKA. *See table(s) above for measurements and observations.     Preliminary    CT CHEST ABDOMEN PELVIS WO CONTRAST  Result Date: 08/02/2022 CLINICAL DATA:  Altered mental status. Found unresponsive in his bedroom. Diabetes. EXAM: CT  CHEST, ABDOMEN AND PELVIS WITHOUT CONTRAST TECHNIQUE: Multidetector CT imaging of the chest, abdomen and pelvis was performed following the standard protocol without IV contrast. RADIATION DOSE REDUCTION: This exam was performed according to the departmental dose-optimization program which includes automated exposure control, adjustment of the mA and/or kV according to patient size and/or use of iterative reconstruction technique. COMPARISON:  Chest radiographs obtained earlier today. Abdomen and pelvis CT dated 07/23/2022. Chest CT dated 07/03/2022. FINDINGS: CT CHEST FINDINGS Cardiovascular: The left atrium is enlarged in the right atrium is mildly enlarged with overall borderline enlargement of the heart. Dense atheromatous calcifications, including the coronary arteries and aorta. Mediastinum/Nodes: No enlarged mediastinal, hilar, or axillary lymph nodes. Thyroid gland, trachea, and esophagus demonstrate no significant findings. Lungs/Pleura: Moderate-sized bilateral pleural effusions with an interval increase in size. Decreased associated atelectasis. Increased patchy alveolar and interstitial opacity in the dependent portions of both lungs. No lung nodules. Musculoskeletal: Mild thoracic and lower cervical spine degenerative changes. CT ABDOMEN PELVIS FINDINGS Hepatobiliary: Multiple small dependent gallstones in the gallbladder measuring up to 4 mm in maximum diameter each. No gallbladder wall thickening or pericholecystic fluid. Unremarkable liver. Pancreas: Diffuse pancreatic atrophy. Spleen: Unremarkable. Adrenals/Urinary Tract: Foley catheter in the urinary bladder. Unremarkable adrenal glands, kidneys and ureters. Stomach/Bowel: The appendix is not visualized today. This was visible and normal on 07/23/2022. No interval findings suspicious for appendicitis. Mildly prominent stool in the colon. Unremarkable stomach and small bowel. Vascular/Lymphatic: Dense atheromatous arterial calcifications without  aneurysm. No enlarged lymph nodes. Reproductive: Prostate is unremarkable. Other: No abdominal wall hernia or abnormality. No abdominopelvic ascites. Musculoskeletal: Mild lumbar spine degenerative changes. IMPRESSION: 1. Moderate-sized bilateral pleural effusions with an interval increase in size. 2. Increased patchy alveolar and interstitial opacity in the dependent portions of both lungs, compatible with pulmonary edema with associated biatrial enlargement. 3. Cholelithiasis. 4. Diffuse pancreatic atrophy compatible with the history of diabetes. 5. Dense calcific coronary artery and aortic atherosclerosis. Aortic Atherosclerosis (ICD10-I70.0). Electronically Signed   By: Claudie Revering M.D.   On: 08/02/2022 15:50   CT Head Wo Contrast  Result Date: 08/02/2022 CLINICAL DATA:  Altered mental status. Found unresponsive. EXAM: CT HEAD WITHOUT CONTRAST TECHNIQUE: Contiguous axial images were obtained from the base of the skull through the vertex without intravenous contrast. RADIATION DOSE REDUCTION: This exam was performed according to the departmental dose-optimization program which includes automated exposure control, adjustment of the mA and/or kV according to patient size and/or use of iterative reconstruction technique. COMPARISON:  06/25/2022 FINDINGS: Brain: Stable mildly enlarged ventricles and cortical sulci. Stable mild patchy white matter low density in both cerebral hemispheres. 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 No intracranial hemorrhage, mass lesion or CT evidence of acute infarction. Vascular: No hyperdense vessel or unexpected calcification. Skull: Normal. Negative for fracture or focal lesion. Sinuses/Orbits: Stable post cataract extraction changes on the right. Unremarkable left orbit. Interval mild sphenoid sinus mucosal thickening. Other: None. IMPRESSION: 1. No acute abnormality. 2. Stable mild diffuse cerebral and cerebellar atrophy. 3. Stable mild  chronic small vessel  white matter ischemic changes in both cerebral hemispheres. 4. 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 5. Interval mild sphenoid sinusitis. Electronically Signed   By: Claudie Revering M.D.   On: 08/02/2022 15:33   DG Chest 1 View  Result Date: 08/02/2022 CLINICAL DATA:  [id number] Sepsis [facility]) [id number] EXAM: CHEST  1 VIEW COMPARISON:  Radiograph 07/02/2022 FINDINGS: Cardiomediastinal silhouette is unchanged. There are bilateral interstitial and lower lung predominant alveolar opacities. Small bilateral pleural effusions, left greater than right. No evidence of pneumothorax. No acute osseous abnormality. IMPRESSION: Findings are most consistent with moderate pulmonary edema and small, left greater than right pleural effusions. Superimposed infection is possible. Electronically Signed   By: Maurine Simmering M.D.   On: 08/02/2022 15:15     Gerrit Heck, MD 08/02/2022, 8:52 PM PGY-2, [place] Intern pager: [phone], text pages welcome Secure chat group [place]

## 2022-08-02 NOTE — Progress Notes (Signed)
ANTICOAGULATION CONSULT NOTE - Initial Consult  Pharmacy Consult for heparin Indication: atrial fibrillation  No Known Allergies  Patient Measurements: Height: 6' 3.98" (193 cm) Weight: 100.6 kg (221 lb 12.5 oz) IBW/kg (Calculated) : 86.76 Heparin Dosing Weight: TBW  Vital Signs: Temp: 98.4 F (36.9 C) (10/13 2107) Temp Source: Axillary (10/13 2107) BP: 110/78 (10/13 1645) Pulse Rate: 108 (10/13 1645)  Labs: Recent Labs    08/02/22 1436 08/02/22 1445 08/02/22 1820  HGB 13.1  --   --   HCT 42.6  --   --   PLT 351  --   --   APTT 32  --   --   LABPROT 14.9  --   --   INR 1.2  --   --   CREATININE 1.04  --   --   CKTOTAL  --   --  161  TROPONINIHS  --  435*  --     Estimated Creatinine Clearance: 86.9 mL/min (by C-G formula based on SCr of 1.04 mg/dL).   Medical History: Past Medical History:  Diagnosis Date   BPH with obstruction/lower urinary tract symptoms 02/02/2018   CAD (coronary artery disease)    Diabetic polyneuropathy associated with type 2 diabetes mellitus ([place]) 07/28/2021   Essential hypertension 01/25/2016   Hyperlipidemia    Ischemic cardiomyopathy    OSA on CPAP    Paroxysmal atrial fibrillation (HCC)    Thoracic ascending aortic aneurysm (HCC)    TIA (transient ischemic attack) 2012   Type 2 diabetes mellitus (HCC)      Assessment: 67 YOM presenting with AMS, unable to pass swallow, hx of Afib on Eliquis PTA.    Goal of Therapy:  Heparin level 0.3-0.7 units/ml aPTT 66-102 seconds Monitor platelets by anticoagulation protocol: Yes   Plan:  Heparin gtt at 1500 units/hr, no bolus F/u 6 hour aPTT/HL F/u ability to transition back to [place], PharmD Clinical Pharmacist ED Pharmacist Phone # [phone] 08/02/2022 9:27 PM

## 2022-08-02 NOTE — ED Notes (Signed)
Patient transported to X-ray 

## 2022-08-02 NOTE — ED Provider Notes (Signed)
[facility] EMERGENCY DEPARTMENT Provider Note   CSN: [id number] Arrival date & time: 08/02/22  1401     History  Chief Complaint  Patient presents with   Altered Mental Status   Hypoglycemia    Peter Yang is a 66 y.o. male with medical history of diabetes, BPH with obstruction, essential hypertension, CAD, paroxysmal A-fib on Eliquis.  Patient presents to the ED for evaluation of altered mental status.  Per EMS, patient found down at home.  The patient not been seen for 2 days by his neighbor who called EMS.  Patient was found unresponsive in his bedroom with a CBG of 36.  The patient was given 1 mg of glucagon intramuscularly which raised his CBG to 63.  Patient has history of diabetes, noncompliant.  The patient on my examination is febrile, tachycardic with soft blood pressure.  Septic order set has been ordered at this time.  Patient unable to follow commands, altered currently.  Patient appears to be in A-fib on the monitor.   Altered Mental Status Hypoglycemia Associated symptoms: altered mental status        Home Medications Prior to Admission medications   Medication Sig Start Date End Date Taking? Authorizing Provider  acetaminophen (TYLENOL) 325 MG tablet Take 2 tablets (650 mg total) by mouth every 6 (six) hours as needed for mild pain (or Fever >/= 101). 07/30/21   Raiford Noble Latif, DO  apixaban (ELIQUIS) 5 MG TABS tablet Take 1 tablet (5 mg total) by mouth 2 (two) times daily. 06/18/22 07/21/22  Donne Hazel, MD  atorvastatin (LIPITOR) 80 MG tablet Take 80 mg by mouth daily. 03/17/21   [provider]  diclofenac Sodium (VOLTAREN) 1 % GEL Apply 2 g topically 2 (two) times daily as needed (pain). 07/20/21   [provider]  finasteride (PROSCAR) 5 MG tablet Take 1 tablet (5 mg total) by mouth daily. 06/30/22 09/28/22  Pokhrel, Corrie Mckusick, MD  gabapentin (NEURONTIN) 300 MG capsule Take 300 mg by mouth at bedtime. 07/20/21   [provider]  insulin degludec (TRESIBA FLEXTOUCH) 100 UNIT/ML FlexTouch Pen Inject 10 Units into the skin at bedtime. 06/30/22   Pokhrel, Corrie Mckusick, MD  Magnesium 500 MG TABS Take 500 mg by mouth at bedtime.    [provider]  metFORMIN (GLUCOPHAGE) 500 MG tablet Take 1,000 mg by mouth 2 (two) times daily with a meal.    [provider]  pantoprazole (PROTONIX) 40 MG tablet Take 1 tablet (40 mg total) by mouth daily. 06/30/22   Pokhrel, Corrie Mckusick, MD  potassium chloride SA (KLOR-CON M) 20 MEQ tablet Take 20 mEq by mouth daily.    [provider]      Allergies    Patient has no known allergies.    Review of Systems   Review of Systems  Unable to perform ROS: Mental status change (Level 5 caveat)    Physical Exam Updated Vital Signs BP 123/61   Pulse 100   Temp (!) 100.4 F (38 C) (Rectal)   Resp 14   SpO2 99%  Physical Exam Vitals and nursing note reviewed.  Constitutional:      Appearance: He is ill-appearing and toxic-appearing.     Comments: Disheveled appearance  HENT:     Head: Normocephalic and atraumatic.     Mouth/Throat:     Mouth: Mucous membranes are dry.  Eyes:     Pupils: Pupils are equal, round, and reactive to light.  Cardiovascular:  Pulses:          Dorsalis pedis pulses are 1+ on the right side.  Pulmonary:     Effort: Pulmonary effort is normal.     Breath sounds: Normal breath sounds. No wheezing.  Abdominal:     General: Abdomen is flat. There is no distension.     Palpations: Abdomen is soft.     Tenderness: There is no abdominal tenderness.  Genitourinary:    Comments: Indwelling Foley catheter in place Musculoskeletal:     Cervical back: No rigidity.     Left Lower Extremity: Left leg is amputated below knee.  Skin:    General: Skin is warm and dry.     Capillary Refill: Capillary refill takes less than 2 seconds.  Neurological:     Mental Status: He is lethargic.     Comments: Patient uncooperative.  The patient  will seemingly draw away during examination.  The patient is unable to communicate secondary to condition.  He is moving extremities spontaneously.  Psychiatric:        Behavior: Behavior is uncooperative.     ED Results / Procedures / Treatments   Labs (all labs ordered are listed, but only abnormal results are displayed) Labs Reviewed  COMPREHENSIVE METABOLIC PANEL - Abnormal; Notable for the following components:      Result Value   CO2 16 (*)    Glucose, Bld 60 (*)    Calcium 8.8 (*)    Albumin 3.0 (*)    All other components within normal limits  LACTIC ACID, PLASMA - Abnormal; Notable for the following components:   Lactic Acid, Venous 2.2 (*)    All other components within normal limits  CBC WITH DIFFERENTIAL/PLATELET - Abnormal; Notable for the following components:   WBC 18.3 (*)    Neutro Abs 15.5 (*)    Abs Immature Granulocytes 0.11 (*)    All other components within normal limits  URINALYSIS, ROUTINE W REFLEX MICROSCOPIC - Abnormal; Notable for the following components:   Color, Urine AMBER (*)    APPearance TURBID (*)    Glucose, UA >=500 (*)    Hgb urine dipstick LARGE (*)    Ketones, ur 5 (*)    Protein, ur 100 (*)    Leukocytes,Ua LARGE (*)    RBC / HPF >50 (*)    WBC, UA >50 (*)    All other components within normal limits  CBG MONITORING, ED - Abnormal; Notable for the following components:   Glucose-Capillary 37 (*)    All other components within normal limits  CULTURE, BLOOD (ROUTINE X 2)  CULTURE, BLOOD (ROUTINE X 2)  RESP PANEL BY RT-PCR (FLU A&B, COVID) ARPGX2  URINE CULTURE  PROTIME-INR  APTT  LACTIC ACID, PLASMA  CK  CBG MONITORING, ED  CBG MONITORING, ED  CBG MONITORING, ED  TYPE AND SCREEN  TROPONIN I (HIGH SENSITIVITY)    EKG None  Radiology CT Head Wo Contrast  Result Date: 08/02/2022 CLINICAL DATA:  Altered mental status. Found unresponsive. EXAM: CT HEAD WITHOUT CONTRAST TECHNIQUE: Contiguous axial images were obtained from  the base of the skull through the vertex without intravenous contrast. RADIATION DOSE REDUCTION: This exam was performed according to the departmental dose-optimization program which includes automated exposure control, adjustment of the mA and/or kV according to patient size and/or use of iterative reconstruction technique. COMPARISON:  06/25/2022 FINDINGS: Brain: Stable mildly enlarged ventricles and cortical sulci. Stable mild patchy white matter low density in both cerebral hemispheres. 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 No intracranial hemorrhage, mass lesion or CT evidence of acute infarction. Vascular: No hyperdense vessel or unexpected calcification. Skull: Normal. Negative for fracture or focal lesion. Sinuses/Orbits: Stable post cataract extraction changes on the right. Unremarkable left orbit. Interval mild sphenoid sinus mucosal thickening. Other: None. IMPRESSION: 1. No acute abnormality. 2. Stable mild diffuse cerebral and cerebellar atrophy. 3. Stable mild chronic small vessel white matter ischemic changes in both cerebral hemispheres. 4. 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 5. Interval mild sphenoid sinusitis. Electronically Signed   By: Claudie Revering M.D.   On: 08/02/2022 15:33   DG Chest 1 View  Result Date: 08/02/2022 CLINICAL DATA:  [id number] Sepsis [facility]) [id number] EXAM: CHEST  1 VIEW COMPARISON:  Radiograph 07/02/2022 FINDINGS: Cardiomediastinal silhouette is unchanged. There are bilateral interstitial and lower lung predominant alveolar opacities. Small bilateral pleural effusions, left greater than right. No evidence of pneumothorax. No acute osseous abnormality. IMPRESSION: Findings are most consistent with moderate pulmonary edema and small, left greater than right pleural effusions. Superimposed infection is possible. Electronically Signed   By: Maurine Simmering M.D.   On: 08/02/2022 15:15    Procedures Procedures   Medications Ordered in ED Medications   lactated ringers bolus 1,000 mL (has no administration in time range)  acetaminophen (TYLENOL) suppository 650 mg (has no administration in time range)  meropenem (MERREM) 1 g in sodium chloride 0.9 % 100 mL IVPB (has no administration in time range)  dextrose 50 % solution (has no administration in time range)    ED Course/ Medical Decision Making/ A&P                           Medical Decision Making Amount and/or Complexity of Data Reviewed Labs: ordered. Radiology: ordered. ECG/medicine tests: ordered.  Risk OTC drugs.   66 year old now presents to the ED for evaluation of suspected hypoglycemia.  Please see HPI for further details.  On chart review, it appears that the patient was recently discharged from [facility] where he was admitted for hematuria, cystitis, AKI, urinary retention.  Patient urine culture at this time grew out ESBL resistant E. coli.  Patient has Foley catheter in place.  Patient was placed on ertapenem at this time.  The patient was found down at home with a CBG of 36.  Due to acuity of patient condition we will proceed with sepsis order set, LR, Tylenol suppository for fever, meropenem for suspected sepsis.  We will evaluate the patient with CBG, CK, urine culture, respiratory panel, troponin, CMP, lactic, CBC, APTT, PT/INR, urinalysis.  We will also proceed with imaging studies to include plain film imaging of chest, CT head, CT chest abdomen pelvis.  Patient urinalysis concerning for UTI.  Patient PT/INR, APTT within normal limits.  Patient CBC with leukocytosis 18.3.  Patient CMP unremarkable.  Patient CBG 37 despite 1 mg glucagon with EMS, will replace.  Patient chest x-ray concerning for pleural effusions versus infectious process.  The patient CT head does not show any intracranial abnormality.  CT chest abdomen pelvis pending at this time.  Patient work-up not yet complete.  The patient will be signed out to my colleague Theodis Blaze, PA-C for  further management.  Patient stable at time of handoff.  Final Clinical Impression(s) / ED Diagnoses Final diagnoses:  Altered mental status, unspecified altered mental status type  Hypoglycemia    Rx / DC Orders ED Discharge Orders  None         Lawana Chambers 08/02/22 Maili, MD 08/03/22 [phone]

## 2022-08-02 NOTE — ED Notes (Signed)
Provider Mayra Neer made aware of critical troponin of 435.

## 2022-08-02 NOTE — ED Triage Notes (Signed)
Pt bib ems from home; neighbor called out , haven't been able to contact pt in 2 days; found unresponsive in bedroom, cbg 36 initially; given 1 mg glucagon IM, cbg 63; unable to get IVaccess; hx dm; non compliant; 138/92, HR 86, 98% RA

## 2022-08-02 NOTE — Assessment & Plan Note (Addendum)
Stable, asymptomatic aside from chronic hematuria. - Continue Bactrim q12h (end date 11/14 per previous notes) - Urology follow-up outpatient

## 2022-08-02 NOTE — Progress Notes (Signed)
TOC CSW notifed Calvin with [facility] that pt had been discharged.  CSW saw this information once dr put note in.  .Sacha Radloff

## 2022-08-02 NOTE — Assessment & Plan Note (Deleted)
Initially p/w hypoglycemia, thought to be a cause of his AMS. Hypoglycemia is now resolved. -Restarted SSI -CBG q4h -Hypoglycemia protocol, D50 ordered as needed

## 2022-08-02 NOTE — Progress Notes (Signed)
TOC CSW received a call from [facility], Kerry Dory [phone].  Kerry Dory was inquiring about pts stay, was he going to be admitted or dc.  CSW informed Kerry Dory that Pt had just gotten here and no discussions had been made at that time.  Calvin asked for a return call when the information was available.  Ainsleigh Kakos Tarpley-Carter, MSW, LCSW-A Pronouns:  She/Her/Hers [facility] HealthTransitions of Care Clinical Social Worker Direct Number:  [phone] [email or web address]

## 2022-08-02 NOTE — Assessment & Plan Note (Deleted)
Rate controlled currently. -Eliquis 5 mg BID -Consider IV rate control if RVR -Keep K>4, Mag>2

## 2022-08-03 ENCOUNTER — Encounter (HOSPITAL_COMMUNITY): Payer: Self-pay | Admitting: Student

## 2022-08-03 DIAGNOSIS — E11649 Type 2 diabetes mellitus with hypoglycemia without coma: Secondary | ICD-10-CM

## 2022-08-03 DIAGNOSIS — R509 Fever, unspecified: Secondary | ICD-10-CM | POA: Diagnosis present

## 2022-08-03 DIAGNOSIS — Z87898 Personal history of other specified conditions: Secondary | ICD-10-CM

## 2022-08-03 DIAGNOSIS — R310 Gross hematuria: Secondary | ICD-10-CM

## 2022-08-03 DIAGNOSIS — E440 Moderate protein-calorie malnutrition: Secondary | ICD-10-CM

## 2022-08-03 DIAGNOSIS — N401 Enlarged prostate with lower urinary tract symptoms: Secondary | ICD-10-CM | POA: Diagnosis not present

## 2022-08-03 DIAGNOSIS — I340 Nonrheumatic mitral (valve) insufficiency: Secondary | ICD-10-CM

## 2022-08-03 DIAGNOSIS — R031 Nonspecific low blood-pressure reading: Secondary | ICD-10-CM | POA: Diagnosis not present

## 2022-08-03 DIAGNOSIS — U071 COVID-19: Secondary | ICD-10-CM | POA: Diagnosis present

## 2022-08-03 DIAGNOSIS — E162 Hypoglycemia, unspecified: Secondary | ICD-10-CM

## 2022-08-03 DIAGNOSIS — I5022 Chronic systolic (congestive) heart failure: Secondary | ICD-10-CM | POA: Diagnosis not present

## 2022-08-03 DIAGNOSIS — I252 Old myocardial infarction: Secondary | ICD-10-CM

## 2022-08-03 DIAGNOSIS — G9341 Metabolic encephalopathy: Secondary | ICD-10-CM

## 2022-08-03 DIAGNOSIS — L89152 Pressure ulcer of sacral region, stage 2: Secondary | ICD-10-CM

## 2022-08-03 DIAGNOSIS — G4733 Obstructive sleep apnea (adult) (pediatric): Secondary | ICD-10-CM | POA: Insufficient documentation

## 2022-08-03 DIAGNOSIS — J90 Pleural effusion, not elsewhere classified: Secondary | ICD-10-CM

## 2022-08-03 DIAGNOSIS — I739 Peripheral vascular disease, unspecified: Secondary | ICD-10-CM

## 2022-08-03 DIAGNOSIS — R339 Retention of urine, unspecified: Secondary | ICD-10-CM

## 2022-08-03 DIAGNOSIS — I480 Paroxysmal atrial fibrillation: Secondary | ICD-10-CM

## 2022-08-03 DIAGNOSIS — Z741 Need for assistance with personal care: Secondary | ICD-10-CM

## 2022-08-03 DIAGNOSIS — N138 Other obstructive and reflux uropathy: Secondary | ICD-10-CM

## 2022-08-03 DIAGNOSIS — Z7409 Other reduced mobility: Secondary | ICD-10-CM

## 2022-08-03 DIAGNOSIS — N390 Urinary tract infection, site not specified: Secondary | ICD-10-CM

## 2022-08-03 LAB — HEPARIN LEVEL (UNFRACTIONATED): Heparin Unfractionated: <0.1 IU/mL — ABNORMAL LOW (ref 0.30–0.70)

## 2022-08-03 LAB — COMPREHENSIVE METABOLIC PANEL
ALT: 19 U/L (ref 0–44)
AST: 45 U/L — ABNORMAL HIGH (ref 15–41)
Albumin: 2.8 g/dL — ABNORMAL LOW (ref 3.5–5.0)
Alkaline Phosphatase: 74 U/L (ref 38–126)
Anion gap: 8 (ref 5–15)
BUN: 20 mg/dL (ref 8–23)
CO2: 21 mmol/L — ABNORMAL LOW (ref 22–32)
Calcium: 8.6 mg/dL — ABNORMAL LOW (ref 8.9–10.3)
Chloride: 106 mmol/L (ref 98–111)
Creatinine, Ser: 1.15 mg/dL (ref 0.61–1.24)
GFR, Estimated: >60 mL/min (ref >60)
Glucose, Bld: 101 mg/dL — ABNORMAL HIGH (ref 70–99)
Potassium: 3.9 mmol/L (ref 3.5–5.1)
Sodium: 135 mmol/L (ref 135–145)
Total Bilirubin: 1.2 mg/dL (ref 0.3–1.2)
Total Protein: 5.9 g/dL — ABNORMAL LOW (ref 6.5–8.1)

## 2022-08-03 LAB — CORTISOL: Cortisol, Plasma: 13.2 ug/dL

## 2022-08-03 LAB — GLUCOSE, CAPILLARY
Glucose-Capillary: 105 mg/dL — ABNORMAL HIGH (ref 70–99)
Glucose-Capillary: 67 mg/dL — ABNORMAL LOW (ref 70–99)
Glucose-Capillary: 80 mg/dL (ref 70–99)

## 2022-08-03 LAB — CBG MONITORING, ED
Glucose-Capillary: 82 mg/dL (ref 70–99)
Glucose-Capillary: 71 mg/dL (ref 70–99)
Glucose-Capillary: 74 mg/dL (ref 70–99)
Glucose-Capillary: 75 mg/dL (ref 70–99)
Glucose-Capillary: 52 mg/dL — ABNORMAL LOW (ref 70–99)
Glucose-Capillary: 105 mg/dL — ABNORMAL HIGH (ref 70–99)

## 2022-08-03 LAB — TROPONIN I (HIGH SENSITIVITY)
Troponin I (High Sensitivity): 532 ng/L (ref <18)
Troponin I (High Sensitivity): 273 ng/L (ref <18)
Troponin I (High Sensitivity): 326 ng/L (ref <18)

## 2022-08-03 LAB — TSH: TSH: 5.441 u[IU]/mL — ABNORMAL HIGH (ref 0.350–4.500)

## 2022-08-03 LAB — HEMOGLOBIN A1C
Hgb A1c MFr Bld: 6.6 % — ABNORMAL HIGH (ref 4.8–5.6)
Mean Plasma Glucose: 142.72 mg/dL

## 2022-08-03 LAB — APTT: aPTT: 47 seconds — ABNORMAL HIGH (ref 24–36)

## 2022-08-03 LAB — MAGNESIUM: Magnesium: 1.8 mg/dL (ref 1.7–2.4)

## 2022-08-03 MED ORDER — PREDNISONE 20 MG PO TABS: 20.0000 mg | ORAL_TABLET | Freq: Every day | ORAL | Status: DC

## 2022-08-03 MED ORDER — DEXTROSE IN LACTATED RINGERS 5 % IV SOLN: INTRAVENOUS | Status: DC

## 2022-08-03 MED ORDER — MIDODRINE HCL 5 MG PO TABS
10.0000 mg | ORAL_TABLET | Freq: Three times a day (TID) | ORAL | Status: DC
  Administered 2022-08-03 – 2022-08-16 (×41): 10 mg via ORAL
  Filled 2022-08-03 (×41): qty 2

## 2022-08-03 MED ORDER — DEXTROSE 50 % IV SOLN
1.0000 | INTRAVENOUS | Status: DC | PRN
  Administered 2022-08-03: 50 mL via INTRAVENOUS
  Filled 2022-08-03: qty 50

## 2022-08-03 MED ORDER — SULFAMETHOXAZOLE-TRIMETHOPRIM 800-160 MG PO TABS
2.0000 | ORAL_TABLET | Freq: Two times a day (BID) | ORAL | Status: DC
  Administered 2022-08-03 – 2022-08-09 (×13): 2 via ORAL
  Filled 2022-08-03 (×14): qty 2

## 2022-08-03 MED ORDER — CLOPIDOGREL BISULFATE 75 MG PO TABS
75.0000 mg | ORAL_TABLET | Freq: Every day | ORAL | Status: DC
  Administered 2022-08-03 – 2022-08-16 (×14): 75 mg via ORAL
  Filled 2022-08-03 (×14): qty 1

## 2022-08-03 MED ORDER — SODIUM CHLORIDE 0.9 % IV SOLN
1.0000 g | Freq: Three times a day (TID) | INTRAVENOUS | Status: DC
  Administered 2022-08-03 – 2022-08-04 (×3): 1 g via INTRAVENOUS
  Filled 2022-08-03 (×4): qty 20

## 2022-08-03 MED ORDER — APIXABAN 5 MG PO TABS
5.0000 mg | ORAL_TABLET | Freq: Two times a day (BID) | ORAL | Status: DC
  Administered 2022-08-03 – 2022-08-16 (×28): 5 mg via ORAL
  Filled 2022-08-03 (×28): qty 1

## 2022-08-03 MED ORDER — LACTATED RINGERS IV BOLUS: 500.0000 mL | Freq: Once | INTRAVENOUS | Status: DC

## 2022-08-03 MED ORDER — LACTATED RINGERS IV BOLUS
1000.0000 mL | Freq: Once | INTRAVENOUS | Status: AC
  Administered 2022-08-04: 1000 mL via INTRAVENOUS

## 2022-08-03 MED ORDER — CHLORHEXIDINE GLUCONATE CLOTH 2 % EX PADS
6.0000 | MEDICATED_PAD | Freq: Every day | CUTANEOUS | Status: DC
  Administered 2022-08-04 – 2022-08-14 (×9): 6 via TOPICAL

## 2022-08-03 NOTE — ED Notes (Signed)
MD messaged at this time regarding bp of 79/51. MAP is 60. Waiting for new orders.

## 2022-08-03 NOTE — Progress Notes (Signed)
ANTICOAGULATION CONSULT NOTE   Pharmacy Consult for heparin Indication: atrial fibrillation  No Known Allergies  Patient Measurements: Height: 6' 3.98" (193 cm) Weight: 100.6 kg (221 lb 12.5 oz) IBW/kg (Calculated) : 86.76 Heparin Dosing Weight: TBW  Vital Signs: Temp: 97.1 F (36.2 C) (10/14 0112) Temp Source: Axillary (10/13 2107) BP: 97/68 (10/14 0500) Pulse Rate: 84 (10/14 0500)  Labs: Recent Labs    08/02/22 1436 08/02/22 1445 08/02/22 1820 08/03/22 0400  HGB 13.1  --   --   --   HCT 42.6  --   --   --   PLT 351  --   --   --   APTT 32  --   --  47*  LABPROT 14.9  --   --   --   INR 1.2  --   --   --   HEPARINUNFRC  --   --   --  <0.10*  CREATININE 1.04  --   --  1.15  CKTOTAL  --   --  161  --   TROPONINIHS  --  435*  --   --      Estimated Creatinine Clearance: 78.6 mL/min (by C-G formula based on SCr of 1.15 mg/dL).   Medical History: Past Medical History:  Diagnosis Date   BPH with obstruction/lower urinary tract symptoms 02/02/2018   CAD (coronary artery disease)    Diabetic polyneuropathy associated with type 2 diabetes mellitus ([place]) 07/28/2021   Essential hypertension 01/25/2016   Hyperlipidemia    Ischemic cardiomyopathy    OSA on CPAP    Paroxysmal atrial fibrillation (HCC)    Thoracic ascending aortic aneurysm (HCC)    TIA (transient ischemic attack) 2012   Type 2 diabetes mellitus (HCC)      Assessment: 35 YOM presenting with AMS, unable to pass swallow, hx of Afib on Eliquis PTA.    Initial heparin level and aPTT are not correlating: aPTT 47 below goal; no issues with infusion or overt s/sx of bleeding per RN  Goal of Therapy:  Heparin level 0.3-0.7 units/ml aPTT 66-102 seconds Monitor platelets by anticoagulation protocol: Yes   Plan:  Increase heparin gtt to 1800 units/hr F/u 8 hour aPTT F/u ability to transition back to PO  Georga Bora, PharmD Clinical Pharmacist 08/03/2022 5:13 AM Please check AMION for all Jack numbers

## 2022-08-03 NOTE — ED Notes (Signed)
Note pt foley placed by previous clinician is not a temp foley. Temp cord and foley at bedside for placement. No additional clinician available at this time to complete order.

## 2022-08-03 NOTE — Assessment & Plan Note (Deleted)
Asymptomatic - Continue contact/air precautions

## 2022-08-03 NOTE — Progress Notes (Signed)
Daily Progress Note Intern Pager: [phone]  Patient name: Peter Yang Medical record number: [id number] Date of birth: Dec 25, 1955 Age: 66 y.o. Gender: male  Primary Care Provider: Clovia Cuff, MD Consultants: None Code Status: DNR (DNR band present, confirmed DNR status with patient)  Pt Overview and Major Events to Date:  10/13/-admitted  Assessment and Plan: 66 year old male presenting with acute metabolic encephalopathy in the setting of hypoglycemia and sepsis from suspected urinary source, now improving. Pertinent PMH/PSH includes BPH, prostate cancer, CAD-NSTEMI in September, T2DM, HTN, HLD, OSA on CPAP, PAF on Eliquis, Thoracic aortic aneurysm, TIA in 2012, CHF-EF 40% with consistent bilateral pleural effusion, recent admission with hematuria/prostatitis.   * Acute metabolic encephalopathy Likely multifactorial from hypoglycemia and underlying sepsis from suspected urinary source.  Improving, more alert but still somewhat disoriented but able to follow commands.  Passed SLP eval today, diet restarted. -Blood and urine cultures -CBGs q4h -Neurochecks -D10 @ 150 mL   Sepsis ([place]) Suspected source is likely urinary based on UA and history of ESBL UTI, Foley catheter in place.  Questionable pneumonia on imaging as well. - continue Meropenem - start TMP-SMX - F/u BCx and UCx - consider ID consult  Type 2 diabetes mellitus with hypoglycemia (HCC) Still borderline low but gradually improving, hypoglycemic earlier this morning given amp of D50. -Hold home insulin and metformin -CBG q4h -D10 @ 150, consider increasing rate if persistently low -Hypoglycemia protocol, D50 ordered as needed  Elevated troponin Hx of CAD and NSTEMI s/p 2 stents. Initial troponin 435 in ED. EKG with some ST depression in V6, similar to previous. Hx of NSTEMI 1 month ago and April. ED consulted cardiology for this however does not appear they have returned call.  Repeat troponin has not been  collected.  Denies chest pain currently. - repeat troponin once, trend until flat    Paroxysmal atrial fibrillation (HCC) Rate controlled currently. -Heparin per pharmacy, consider resuming home apixaban -Consider IV rate control if RVR -Keep K>4, Mag>2  Chronic systolic CHF (congestive heart failure) ([place]) Last echo 06/15/2022 with EF of 40-45% with LV with regional wall motion abnormality, LA severly dilated, mod MVR.  Does not appear to be in acute exacerbation currently. -Strict I/O's -Daily weights  Pressure ulcer of sacral region, stage 2 ([place]) Present on admission.  Wound care per WOC recommendations.  COVID-19 virus infection Positive COVID test 10/13.  Does not appear to be overtly symptomatic. - consider antiviral treatment  Cold right foot    PVD (peripheral vascular disease) ([place]) Cool right foot, abnormal ABI yesterday. - consider vascular surgery consult       FEN/GI: Regular diet PPx: Heparin drip, consider transition to home apixaban Dispo: To be determined pending medical work-up and stability.   Subjective:  Overnight noted to be hypoglycemic with CBG 59, rate of D10 was increased to 150 cc/h.  This morning, patient is awake and conversant.  He denies any pain at this time.  Denies any cough, sore throat, congestion, chest pain, shortness of breath, fever, chills, dysuria.  Objective: Temp:  [97.1 F (36.2 C)-100.4 F (38 C)] 97.7 F (36.5 C) (10/14 0946) Pulse Rate:  [82-127] 82 (10/14 1000) Resp:  [11-23] 13 (10/14 1000) BP: (79-123)/(51-91) 87/56 (10/14 1000) SpO2:  [96 %-100 %] 100 % (10/14 1000) Weight:  [100.6 kg] 100.6 kg (10/13 1951) Physical Exam: General: Awake, resting in bed comfortably, somewhat disheveled, NAD Cardiovascular: Irregularly irregular, normal rate, no murmurs Respiratory: Clear to auscultation bilaterally, no respiratory  distress Abdomen: Soft, nontender Extremities: S/p left BKA, right foot is cool to touch.   Unable to palpate DP pulse. Neuro: Alert and oriented x2, able to state name and hospital but stated year was 333.  Some speech latency noted.  Laboratory: Most recent CBC Lab Results  Component Value Date   WBC 18.3 (H) 08/02/2022   HGB 13.1 08/02/2022   HCT 42.6 08/02/2022   MCV 96.2 08/02/2022   PLT 351 08/02/2022   Most recent BMP    Latest Ref Rng & Units 08/03/2022    4:00 AM  BMP  Glucose 70 - 99 mg/dL 101   BUN 8 - 23 mg/dL 20   Creatinine 0.61 - 1.24 mg/dL 1.15   Sodium 135 - 145 mmol/L 135   Potassium 3.5 - 5.1 mmol/L 3.9   Chloride 98 - 111 mmol/L 106   CO2 22 - 32 mmol/L 21   Calcium 8.9 - 10.3 mg/dL 8.6      Imaging/Diagnostic Tests: ABI - Right: Resting right ankle-brachial index indicates noncompressible right lower extremity arteries. The right toe-brachial index is abnormal.   Zola Button, MD 08/03/2022, 11:10 AM  PGY-3, [place] Intern pager: [phone], text pages welcome Secure chat group [place]

## 2022-08-03 NOTE — ED Notes (Signed)
Admit MD paged by unit secretary at this time regarding CBG of 52. No orders for PRN d50 at this time.

## 2022-08-03 NOTE — Progress Notes (Signed)
FMTS Brief Progress Note  S: Patient states he is doing well today, able to voice his name and date of birth in addition to location and what brought him in.  Denies any concerns or questions at this time.  Also able to recount the loss of his left lower extremity.  O: BP (!) 90/58 (BP Location: Right Arm)   Pulse 87   Temp 98 F (36.7 C) (Oral)   Resp 20   Ht '6\' 4"'$  (1.93 m)   Wt 96.9 kg   SpO2 95%   BMI 26.00 kg/m   General: Well-appearing, NAD, A&O x3 CV: Irregularly irregular, rate controlled, no murmurs appreciated Pulm: CTA B, normal WOB Extremities: +1 pitting edema of right lower extremity, left BKA; tourniquet on right arm, no sign of skin breakdown or thrombophlebitis, sensation and strength intact   A/P: Acute metabolic encephalopathy (resolved at this time), urosepsis currently being treated with meropenem and Bactrim.  T2DM with hypoglycemia, stable currently but will continue to monitor.  Elevated troponins, most recent 273 decreased from 532.  Not requiring further trending.  Tourniquet on right arm Removed during encounter, patient asymptomatic at this time.  Discussed with RN, lab was there about 20 minutes prior. No sign of thrombophlebitis and patient was unbothered by it. Will continue to monitor. - Orders reviewed. Labs for AM ordered, which was adjusted as needed.   Wells Guiles, DO 08/03/2022, 10:32 PM PGY-2, [facility] Night Resident  Please page [phone] with questions.

## 2022-08-03 NOTE — ED Notes (Signed)
Dr.Todd came at bedside and cancelled IVF. MD states to contact him when MAP goes below 55. Pt has extensive pleural effusions. Pt appears calm and not in distress. Continues to be alert and oriented x 2. Will continue to monitor.

## 2022-08-03 NOTE — Evaluation (Signed)
Occupational Therapy Evaluation Patient Details Name: Peter Yang MRN: [id number] DOB: 1956/09/12 Today's Date: 08/03/2022   History of Present Illness 66 year old male presenting with acute metabolic encephalopathy in the setting of hypoglycemia and sepsis from suspected urinary source.  Also with positive COVID test.  PMH BPH; DM; HTN; PVD s/p L BKA; CAD s/p stent; HLD, hx of TIA, OSA who was recently discharged on 8/29 from [facility] for a NSTEMI and hypoglycemia.   Clinical Impression   Pt currently presents at a max assist level for supine to sit with total assist +2 for simulated selfcare tasks sit to stand.  Pt unable to stand with one therapist this session and needed total assist for scooting up toward the top of the bed on one attempt.  Slow cognitive processing at times with delay response to PLOF questions. Decreased orientation to time (day of the week) and reason for being in the hospital.  Feel he will benefit from acute care OT to help increase ADL independence, but will need extensive SNF level therapy for follow-up to reach supervision/modified independent level.       Recommendations for follow up therapy are one component of a multi-disciplinary discharge planning process, led by the attending physician.  Recommendations may be updated based on patient status, additional functional criteria and insurance authorization.   Follow Up Recommendations  Skilled nursing-short term rehab (<3 hours/day)    Assistance Recommended at Discharge Frequent or constant Supervision/Assistance  Patient can return home with the following Two people to help with walking and/or transfers;A lot of help with bathing/dressing/bathroom;Assistance with cooking/housework;Assist for transportation;Help with stairs or ramp for entrance;Direct supervision/assist for medications management    Functional Status Assessment  Patient has had a recent decline in their functional status and demonstrates the ability  to make significant improvements in function in a reasonable and predictable amount of time.  Equipment Recommendations  Other (comment) (TBD next venue of care)       Precautions / Restrictions Precautions Precautions: Fall Precaution Comments: L BKA no prosthesis with him currently. Restrictions Weight Bearing Restrictions: No      Mobility Bed Mobility Overal bed mobility: Needs Assistance       Supine to sit: Max assist Sit to supine: Mod assist   General bed mobility comments: Pt needed assist with bringing trunk up to sitting EOB and then needed assist with straightening himself in the bed when laying back down.    Transfers Overall transfer level: Needs assistance   Transfers: Sit to/from Stand             General transfer comment: Attempted sit to stand with RW, however pt unable to lift bottom off bed with max assist of one therapist.  Will need +2 to attempt next visit.      Balance Overall balance assessment: Needs assistance Sitting-balance support: No upper extremity supported, Feet supported Sitting balance-Leahy Scale: Fair Sitting balance - Comments: able to maintain static sitting with supervision EOB.                                   ADL either performed or assessed with clinical judgement   ADL Overall ADL's : Needs assistance/impaired     Grooming: Set up;Sitting Grooming Details (indicate cue type and reason): simulated     Lower Body Bathing: Total assistance;+2 for physical assistance;Sit to/from stand Lower Body Bathing Details (indicate cue type and reason): simulated  Lower Body Dressing: +2 for physical assistance;Total assistance;Sit to/from stand Lower Body Dressing Details (indicate cue type and reason): total assist for donning right sock and velcro post op shoe.  Unable to stand with just one therapist.               General ADL Comments: Pt needed max assist for supine to sit EOB and then could not  stand with assist from one therapist.  Total assist for scooting up toward the top of the bed secondary to weakness and decreased initiation/processing.     Vision Baseline Vision/History: 1 Wears glasses Ability to See in Adequate Light: 0 Adequate Patient Visual Report: No change from baseline Vision Assessment?: No apparent visual deficits     Perception Perception Perception: Not tested   Praxis Praxis Praxis: Not tested       Hand Dominance Right   Extremity/Trunk Assessment Upper Extremity Assessment Upper Extremity Assessment: LUE deficits/detail LUE Deficits / Details: hx of rotator cuff injury AROM shoulder flexion 0-40 degrees approximately.  All other joints AROM WFLs with strength grossly 4/5   Lower Extremity Assessment Lower Extremity Assessment: Defer to PT evaluation LLE Deficits / Details: L BKA   Cervical / Trunk Assessment Cervical / Trunk Assessment: Normal   Communication Communication Communication: No difficulties   Cognition Arousal/Alertness: Awake/alert Behavior During Therapy: Flat affect Overall Cognitive Status: No family/caregiver present to determine baseline cognitive functioning Area of Impairment: Orientation, Safety/judgement, Problem solving, Awareness, Memory                 Orientation Level: Disoriented to, Time, Situation   Memory: Decreased short-term memory Following Commands: Follows one step commands with increased time Safety/Judgement: Decreased awareness of safety, Decreased awareness of deficits Awareness: Intellectual Problem Solving: Slow processing, Decreased initiation, Requires verbal cues General Comments: Pt not oriented to day of the week or reason for being in the hospital.  At times when therapist would ask him a questing he would demonstrate significant delay of 3-5 seconds with blank stare.                Home Living Family/patient expects to be discharged to:: Private residence Living  Arrangements: Alone Available Help at Discharge: Neighbor;Available PRN/intermittently Type of Home: House Home Access: Ramped entrance     Home Layout: One level     Bathroom Shower/Tub: Tub/shower unit;Curtain   Biochemist, clinical: Standard     Home Equipment: Conservation officer, nature (2 wheels);Cane - single point;Wheelchair - manual   Additional Comments: Unsure of accuracy of PLOF, no family present to confirm and some things questionable based on info from prior admission as he reports not using the wheelchair much now ( but not wearing prosthesis much in the house) and standing in the shower      Prior Functioning/Environment Prior Level of Function : Independent/Modified Independent             Mobility Comments: reports using walker to get around in the house and used the cane outside ADLs Comments: took shower standing up per his report,  still drives, orders instacart a lot for groceries.        OT Problem List: Decreased strength;Impaired balance (sitting and/or standing);Decreased activity tolerance;Decreased cognition;Decreased safety awareness;Decreased knowledge of use of DME or AE      OT Treatment/Interventions: Self-care/ADL training;DME and/or AE instruction;Therapeutic activities;Patient/family education;Balance training    OT Goals(Current goals can be found in the care plan section) Acute Rehab OT Goals Patient Stated Goal: Pt did not  state but agreeable to working in therapy. OT Goal Formulation: With patient Time For Goal Achievement: 08/17/22 Potential to Achieve Goals: Good  OT Frequency: Min 2X/week       AM-PAC OT "6 Clicks" Daily Activity     Outcome Measure Help from another person eating meals?: None Help from another person taking care of personal grooming?: A Little Help from another person toileting, which includes using toliet, bedpan, or urinal?: Total Help from another person bathing (including washing, rinsing, drying)?: Total Help from  another person to put on and taking off regular upper body clothing?: A Little Help from another person to put on and taking off regular lower body clothing?: Total 6 Click Score: 13   End of Session Equipment Utilized During Treatment: Gait belt Nurse Communication: Mobility status  Activity Tolerance: Patient tolerated treatment well Patient left: in bed;with call bell/phone within reach;with bed alarm set  OT Visit Diagnosis: Muscle weakness (generalized) (M62.81);Unsteadiness on feet (R26.81);Other symptoms and signs involving cognitive function;Repeated falls (R29.6)                Time: 1535-1620 OT Time Calculation (min): 45 min Charges:  OT General Charges $OT Visit: 1 Visit OT Evaluation $OT Eval Moderate Complexity: 1 Mod OT Treatments $Self Care/Home Management : 23-37 mins Intisar Claudio OTR/L 08/03/2022, 6:01 PM

## 2022-08-03 NOTE — ED Notes (Signed)
Pt more alert pulling at IV and cords. Placed safety mittens on pt. Readjusted pt in bed. Posey sitter alarm in place

## 2022-08-03 NOTE — Evaluation (Signed)
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 Overview:  Added automatically from request for surgery [id number] Formatting of this note might be different from the original. Added automatically from request for surgery [id number]   Large bowel perforation (Peter Yang) 09/27/2015   Malignant neoplasm of prostate (Peter Yang) 10/22/2019   Myopia with astigmatism and presbyopia, bilateral 06/15/2018   Last Assessment & Plan:  Formatting of this note might be different from the original. - Wrx printed Formatting of this note might be different from the original. 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 - Stable, monitor Formatting of this note might be different from the original. Last Assessment & Plan:  - Stable, monitor   Retroperitoneal abscess ([place]) 10/02/2015   Status post corneal transplant 10/07/2016   Formatting of this note might be different from the original. Overview:  Added automatically from request for surgery [id number] Formatting of this note might be different from the original. Added automatically from request for surgery [id number]  Last Assessment & Plan:  Formatting of this note might be different from the original. - OD, 2/2 KCN - Stable, no NVK / rejection - Continue off steroids - Mo   Thoracic ascending aortic aneurysm ([place])    TIA (transient ischemic attack) 2012   Type 2 diabetes mellitus (Peter Yang)    Past Surgical History:  Past Surgical History:  Procedure Laterality Date   BELOW KNEE LEG AMPUTATION Left 2022   CORONARY ANGIOPLASTY WITH STENT PLACEMENT  01/2022   DES RCA   LEFT HEART CATH AND CORONARY ANGIOGRAPHY N/A 06/17/2022   Procedure: LEFT HEART CATH AND CORONARY ANGIOGRAPHY;  Surgeon: Nelva Bush, MD;  Location: [place] [facility];  Service: Cardiovascular;  Laterality: N/A;   ROTATOR CUFF REPAIR Left    HPI:  Pt is a 66 y.o. male who presented to ED with AMS. Differential for this pt's presentation of this includes AMS most likely from urosepsis as UA showed large leuks vs. Hypoglycemia. Question of infection- UTI + possible PNA per H&P (10/13). CXR (10/13) revealed "moderate pulmonary edema and small, left greater than right pleural effusions. Superimposed infection is possible". Failed Peter Yang (10/14) due to poor mentation. PMH: BPH, CAD-NSTEMI,  T2DM, HTN, HLD, OSA on CPAP, PAF on Eliquis, Thoracic aortic aneurysm, TIA in 2012, CHF-EF 40% with consistent bilateral pleural effusion, recent admission with hematuria/prostatitis/hx of prostate cancer where he was discharged on foley catheter 07/23/2022.    Assessment / Plan / Recommendation  Clinical  Impression  Pt presents with functional oropharyngeal swallow at bedside. Oral mechanism examination unremarkable, adequate natural dentition noted. Ice chips, single/consecutive sips of thin liquids, bites of puree and regular textures were all consumed without overt signs of aspiration. Oral prep/mastication and clearance of POs was swift. Recommend regular diet/thin liquids with meal set-up/intermittent supervision given recent decline in mentation. No further SLP f/u warranted. Will s/o.  SLP Visit Diagnosis: Dysphagia, unspecified (R13.10)    Aspiration Risk  No limitations;Mild aspiration risk    Diet Recommendation Regular;Thin liquid   Liquid Administration via: Cup;Straw Medication Administration: Whole meds with liquid Supervision: Patient able to self feed;Intermittent supervision to cue for compensatory strategies Compensations: Minimize environmental distractions;Slow rate;Small sips/bites Postural Changes: Seated upright at 90 degrees    Other  Recommendations Oral Care Recommendations: Oral care BID    Recommendations for follow up therapy are one component of a multi-disciplinary discharge planning process, led by the attending physician.  Recommendations may be updated based on patient status, additional functional criteria and insurance authorization.  Follow up Recommendations No SLP follow up      Assistance Recommended at Discharge Frequent or constant Supervision/Assistance  Functional Status Assessment Patient has not had a recent decline in their functional status  Frequency and Duration            Prognosis Prognosis for Safe Diet Advancement: Good Barriers to Reach Goals: Cognitive deficits      Swallow Study   General Date of Onset: 08/02/22 HPI: Pt is a 66 y.o. male who presented to ED with AMS. Differential for this pt's presentation of this includes AMS most likely from urosepsis as UA showed large leuks vs. Hypoglycemia. Question of infection- UTI  + possible PNA per H&P (10/13). CXR (10/13) revealed "moderate pulmonary edema and small, left greater than right pleural effusions. Superimposed infection is possible". Failed Peter Yang (10/14) due to poor mentation. PMH: BPH, CAD-NSTEMI, T2DM, HTN, HLD, OSA on CPAP, PAF on Eliquis, Thoracic aortic aneurysm, TIA in 2012, CHF-EF 40% with consistent bilateral pleural effusion, recent admission with hematuria/prostatitis/hx of prostate cancer where he was discharged on foley catheter 07/23/2022. Type of Study: Bedside Swallow Evaluation Previous Swallow Assessment: none per EMR Diet Prior to this Study: NPO Temperature Spikes Noted: Yes (100.4) Respiratory Status: Room air History of Recent Intubation: No Behavior/Cognition: Alert;Cooperative;Confused Oral Cavity Assessment: Within Functional Limits Oral Cavity - Dentition: Adequate natural dentition Vision: Functional for self-feeding Self-Feeding Abilities: Able to feed self;Needs set up Patient Positioning: Upright in bed;Postural control interferes with function Baseline Vocal Quality: Normal Volitional Cough: Strong Volitional Swallow: Able to elicit    Oral/Motor/Sensory Function Overall Oral Motor/Sensory Function: Within functional limits   Ice Chips Ice chips: Within functional limits Presentation: Spoon   Thin Liquid Thin Liquid: Within functional limits Presentation: Cup;Self Fed;Straw    Nectar Thick Nectar Thick Liquid: Not tested   Honey Thick Honey Thick Liquid: Not tested   Puree Puree: Within functional limits Presentation: Self Fed;Spoon   Solid     Solid: Within functional limits Presentation: [place], [place], [place] Office Number: [phone]  Acie Fredrickson 08/03/2022,10:38 AM

## 2022-08-03 NOTE — Progress Notes (Signed)
PT Cancellation Note  Patient Details Name: Peter Yang MRN: [id number] DOB: May 26, 1956   Cancelled Treatment:    Reason Eval/Treat Not Completed: Medical issues which prohibited therapy.  Low BS and BP, will hold until pt is more medically stable.   Ramond Dial 08/03/2022, 10:13 AM Mee Hives, PT PhD Acute Rehab Dept. Number: [place]

## 2022-08-03 NOTE — Consult Note (Signed)
[facility] Nurse Consult Note: Reason for Consult:stage 2 pressure injury to left intergluteal area, pressure vs moisture Wound type:stage 2 partial thickness skin loss Pressure Injury POA: Yes Measurement:to be obtained by bedside RN with next dressing change and documented on nursing flow sheet Wound APT:CKFWBL, moist Drainage (amount, consistency, odor) small serous to light yellow Periwound:intact, dry Dressing procedure/placement/frequency:Turning and repositioning to minimize time in the supine position will be implemented as well as topical care consisting of placement of a silicone bordered foam dressing. This is to be changed every 3 days and PRN soiling.  [facility] nursing team will not follow, but will remain available to this patient, the nursing and medical teams.  Please re-consult if needed.  Thank you for inviting [place] to participate in this patient's Plan of Care.  Maudie Flakes, MSN, RN, CNS, [place], Serita Grammes, [employer], [employer] phone:  [phone]

## 2022-08-03 NOTE — ED Notes (Signed)
Pt ate a [place] sandwich for lunch. Refused lunch tray at this time. Will continue to monitor.

## 2022-08-03 NOTE — ED Notes (Signed)
MD is aware and was at bedside when pt bp was 82/56. New order is to notify MD when bp MAP goes below 60 at this time. Will continue to monitor.

## 2022-08-03 NOTE — Assessment & Plan Note (Deleted)
Present on admission.  Wound care per WOC recommendations.

## 2022-08-03 NOTE — Assessment & Plan Note (Deleted)
Cool right foot on admission, abnormal ABI.  Patient denies pain. R foot normal temperature currently with 2+ DP pulse. - consider vascular surgery referral outpatient

## 2022-08-03 NOTE — ED Notes (Signed)
Assuming care for this pt. Pt AMS continues, nonverbal and unable to follow commands. IV heparin and IV Dextrose infusing. Updated CBG obtained. Reconnected to VS.

## 2022-08-04 LAB — CBC
HCT: 34.6 % — ABNORMAL LOW (ref 39.0–52.0)
Hemoglobin: 11.5 g/dL — ABNORMAL LOW (ref 13.0–17.0)
MCH: 30.3 pg (ref 26.0–34.0)
MCHC: 33.2 g/dL (ref 30.0–36.0)
MCV: 91.1 fL (ref 80.0–100.0)
Platelets: 198 10*3/uL (ref 150–400)
RBC: 3.8 MIL/uL — ABNORMAL LOW (ref 4.22–5.81)
RDW: 15.2 % (ref 11.5–15.5)
WBC: 11.6 10*3/uL — ABNORMAL HIGH (ref 4.0–10.5)
nRBC: 0 % (ref 0.0–0.2)

## 2022-08-04 LAB — COMPREHENSIVE METABOLIC PANEL
ALT: 21 U/L (ref 0–44)
AST: 34 U/L (ref 15–41)
Albumin: 2.6 g/dL — ABNORMAL LOW (ref 3.5–5.0)
Alkaline Phosphatase: 71 U/L (ref 38–126)
Anion gap: 9 (ref 5–15)
BUN: 15 mg/dL (ref 8–23)
CO2: 21 mmol/L — ABNORMAL LOW (ref 22–32)
Calcium: 8.3 mg/dL — ABNORMAL LOW (ref 8.9–10.3)
Chloride: 105 mmol/L (ref 98–111)
Creatinine, Ser: 1.03 mg/dL (ref 0.61–1.24)
GFR, Estimated: >60 mL/min (ref >60)
Glucose, Bld: 60 mg/dL — ABNORMAL LOW (ref 70–99)
Potassium: 4.2 mmol/L (ref 3.5–5.1)
Sodium: 135 mmol/L (ref 135–145)
Total Bilirubin: 0.8 mg/dL (ref 0.3–1.2)
Total Protein: 5.8 g/dL — ABNORMAL LOW (ref 6.5–8.1)

## 2022-08-04 LAB — GLUCOSE, CAPILLARY
Glucose-Capillary: 104 mg/dL — ABNORMAL HIGH (ref 70–99)
Glucose-Capillary: 122 mg/dL — ABNORMAL HIGH (ref 70–99)
Glucose-Capillary: 134 mg/dL — ABNORMAL HIGH (ref 70–99)
Glucose-Capillary: 58 mg/dL — ABNORMAL LOW (ref 70–99)
Glucose-Capillary: 61 mg/dL — ABNORMAL LOW (ref 70–99)
Glucose-Capillary: 72 mg/dL (ref 70–99)
Glucose-Capillary: 74 mg/dL (ref 70–99)
Glucose-Capillary: 83 mg/dL (ref 70–99)

## 2022-08-04 LAB — HIV ANTIBODY (ROUTINE TESTING W REFLEX): HIV Screen 4th Generation wRfx: NONREACTIVE

## 2022-08-04 LAB — URINE CULTURE: Culture: NO GROWTH

## 2022-08-04 MED ORDER — CEPHALEXIN 500 MG PO CAPS
500.0000 mg | ORAL_CAPSULE | Freq: Two times a day (BID) | ORAL | Status: DC
  Administered 2022-08-04 – 2022-08-05 (×3): 500 mg via ORAL
  Filled 2022-08-04 (×3): qty 1

## 2022-08-04 MED ORDER — GLUCOSE 40 % PO GEL: 1.0000 | ORAL | Status: AC

## 2022-08-04 MED ORDER — GLUCOSE 40 % PO GEL
ORAL | Status: AC
  Administered 2022-08-04: 31 g via ORAL
  Filled 2022-08-04: qty 1

## 2022-08-04 MED ORDER — ASPIRIN 81 MG PO CHEW
81.0000 mg | CHEWABLE_TABLET | Freq: Every day | ORAL | Status: DC
  Administered 2022-08-04 – 2022-08-16 (×13): 81 mg via ORAL
  Filled 2022-08-04 (×13): qty 1

## 2022-08-04 NOTE — Progress Notes (Signed)
Daily Progress Note Intern Pager: [phone]  Patient name: Peter Yang Medical record number: [id number] Date of birth: [birth year] Age: 66 y.o. Gender: male  Primary Care Provider: Clovia Cuff, MD Consultants: None Code Status: DNR  Pt Overview and Major Events to Date:  10/13-admitted  Assessment and Plan:  Peter Yang is a 66 y.o. male BPH, CAD-NSTEMI in September, T2DM, HTN, HLD, OSA on CPAP, PAF on Eliquis, Thoracic aortic aneurysm, TIA in 2012, CHF-EF 40% with consistent bilateral pleural effusion presenting, recent admission with hematuria/prostatitis/hx of prostate cancer where he was discharged on foley catheter 07/23/2022-07/29/2022 (was given ertapenem for ESBL E coli and amoxicillin for Proteus intermediate to imipenem-discharged on Bactrim double strength BID for 6 weeks) with altered mental status.  * Acute metabolic encephalopathy Likely multifactorial from hypoglycemia and underlying sepsis from suspected urinary source.  Improving, A&Ox3 today. Passed SLP eval. -Blood and urine cultures -CBGs q4h -Neurochecks -D5LR '@75'$  mL   Sepsis ([place]) Suspected source is likely urinary based on UA and history of ESBL UTI, Foley catheter in place.  Questionable pneumonia on imaging as well. BCx NH <24 hrs. COVID-positive. - continue Meropenem (10/13-) Day 3 - TMP-SMX (10/14-) Day 2 - F/u BCx and UCx - consider ID consult  Hypoglycemia associated with diabetes ([place]) Mostly in the 100s, lows of 52 and 60. -Hold home insulin and metformin -CBG q4h -D5 LR at 75 ml/hr -Hypoglycemia protocol, D50 ordered as needed  Elevated troponin Hx of CAD and NSTEMI s/p 2 stents. Initial troponin 435 in ED. EKG with some ST depression in V6, similar to previous. Hx of NSTEMI 1 month ago and April. ED consulted cardiology for this however does not appear they have returned call.  Repeat troponin has not been collected.  Denies chest pain currently. - repeat troponin once, trend until  flat - plavix 75 mg daily (recommended DAPT for a year on last hospitalization)    Paroxysmal atrial fibrillation ([facility]) Rate controlled currently. -Eliquis 5 mg BID -Consider IV rate control if RVR -Keep K>4, Mag>2  Heart failure with mid-range ejection fraction ([place]) Last echo 06/15/2022 with EF of 40-45% with LV with regional wall motion abnormality, LA severly dilated, mod MVR.  Does not appear to be in acute exacerbation currently. BP still low and was given 1 L LR with improvement in MAPS. -Strict I/O's -Daily weights  Pressure ulcer of sacral region, stage 2 ([place]) Present on admission.  Wound care per WOC recommendations.  Lab test positive for detection of COVID-19 virus Positive COVID test 10/13.  Does not appear to be overtly symptomatic. - consider antiviral treatment  Cold right foot    PVD (peripheral vascular disease) ([place]) Cool right foot, abnormal ABI yesterday. - consider vascular surgery consult   FEN/GI: Regular diet PPx: Eliquis 5 mg twice daily Dispo: OT recommends SNF, pending clinical improvement  Subjective:  No acute issues overnight, he denies any today  Objective: Temp:  [97.8 F (36.6 C)-98.7 F (37.1 C)] 97.8 F (36.6 C) (10/15 0300) Pulse Rate:  [77-96] 79 (10/15 0300) Resp:  [9-22] 10 (10/15 0400) BP: (73-93)/(53-73) 79/64 (10/15 0400) SpO2:  [77 %-99 %] 94 % (10/15 0300) Weight:  [96.9 kg-98.3 kg] 98.3 kg (10/15 0442) Physical Exam: General: NAD, lying in bed comfortably, alert and oriented x3 to person, place, year but does not remember what brought him in Cardiovascular: Irregularly irregular rate, no tachycardia, 2+ radial pulses bilaterally Respiratory: Clear to auscultation bilaterally, no wheezes rales or crackles, no increased work  of breathing on room air Abdomen: Soft, nontender Extremities: Left BKA, right leg with excoriations but no pitting edema and appears perfused today with no tenderness to  palpation  Laboratory: Most recent CBC Lab Results  Component Value Date   WBC 11.6 (H) 08/04/2022   HGB 11.5 (L) 08/04/2022   HCT 34.6 (L) 08/04/2022   MCV 91.1 08/04/2022   PLT 198 08/04/2022   Most recent BMP    Latest Ref Rng & Units 08/04/2022    4:13 AM  BMP  Glucose 70 - 99 mg/dL 60   BUN 8 - 23 mg/dL 15   Creatinine 0.61 - 1.24 mg/dL 1.03   Sodium 135 - 145 mmol/L 135   Potassium 3.5 - 5.1 mmol/L 4.2   Chloride 98 - 111 mmol/L 105   CO2 22 - 32 mmol/L 21   Calcium 8.9 - 10.3 mg/dL 8.3     Imaging/Diagnostic Tests: No results found.    Peter Heck, MD 08/04/2022, 10:14 AM  PGY-2, [place] Intern pager: [phone], text pages welcome Secure chat group [place]

## 2022-08-04 NOTE — Evaluation (Signed)
Physical Therapy Evaluation Patient Details Name: Peter Yang MRN: [id number] DOB: October 24, 1955 Today's Date: 08/04/2022  History of Present Illness  66 year old male presenting with acute metabolic encephalopathy in the setting of hypoglycemia and sepsis from suspected urinary source.  Also with positive COVID test.  PMH BPH; DM; HTN; PVD s/p L BKA; CAD s/p stent; HLD, hx of TIA, OSA who was recently discharged on 8/29 from [facility] for a NSTEMI and hypoglycemia.  Clinical Impression   Pt admitted with above diagnosis. Reports he lives at home alone; Reports prior to admission, pt was able to manage independently, using RW to walk in house, and that he hasn't used his prosthesis in weeks, and did a hop-to 3 point gait; inconsistencies in the pt's information given, though, and we will need to verify information; Presents to PT with functional dependencies, generalized weakness, cognitive deficits, decr activity tolerance; Mod assist to pull to long sit in the bed, and mod assist to reciprocally scoot backwards in the bed; Pt declining standing trials; see below for BPs supine and sitting;  Pt currently with functional limitations due to the deficits listed below (see PT Problem List). Pt will benefit from skilled PT to increase their independence and safety with mobility to allow discharge to the venue listed below.         08/04/22 1500  Orthostatic Lying   BP- Lying (!) 81/61 (map 69)  Pulse- Lying 83  Orthostatic Sitting  BP- Sitting (!) 79/59 (67 map)  Pulse- Sitting 86        Recommendations for follow up therapy are one component of a multi-disciplinary discharge planning process, led by the attending physician.  Recommendations may be updated based on patient status, additional functional criteria and insurance authorization.  Follow Up Recommendations Skilled nursing-short term rehab (<3 hours/day) Can patient physically be transported by private vehicle: No    Assistance  Recommended at Discharge Frequent or constant Supervision/Assistance  Patient can return home with the following  Two people to help with walking and/or transfers    Equipment Recommendations Other (comment) (consider hospital bed)  Recommendations for Other Services       Functional Status Assessment Patient has had a recent decline in their functional status and demonstrates the ability to make significant improvements in function in a reasonable and predictable amount of time.     Precautions / Restrictions Precautions Precautions: Fall Precaution Comments: L BKA no prosthesis with him currently.      Mobility  Bed Mobility Overal bed mobility: Needs Assistance Bed Mobility: Supine to Sit, Sit to Supine           General bed mobility comments: Mod assist to pull to sit, body still oriented parallel to bed, semi-long sitting with RLE to the side and dangling off of bed; LLE in front of pateitn, hip externally rotated and knee flexed; Mod assist to lay back to supine, after repositioning by reciprocally scooting backwards to get up toward [facility] with bed tilted HOB downward    Transfers Overall transfer level: Needs assistance Equipment used:  (bed pad) Transfers: Bed to chair/wheelchair/BSC (simulated)         Anterior-Posterior transfers: Mod assist   General transfer comment: Heavy mod assist to reciprocally scoot backwards towards [facility]    Ambulation/Gait                  Stairs            Wheelchair Mobility    Modified Rankin (Stroke Patients Only)  Balance     Sitting balance-Leahy Scale: Fair Sitting balance - Comments: Able to reciprocally scoot hips backwards in sitting with assist                                     Pertinent Vitals/Pain Pain Assessment Pain Assessment: No/denies pain    Home Living Family/patient expects to be discharged to:: Private residence Living Arrangements: Alone Available Help at  Discharge: Neighbor;Available PRN/intermittently Type of Home: House Home Access: Ramped entrance       Home Layout: One level Home Equipment: Conservation officer, nature (2 wheels);Cane - single point;Wheelchair - manual Additional Comments: Unsure of accuracy of PLOF, no family present to confirm and some things questionable based on info from prior admission as he reports not using the wheelchair much now ( but not wearing prosthesis much in the house) and standing in the shower    Prior Function Prior Level of Function : Independent/Modified Independent             Mobility Comments: reports using walker to get around in the house and used the cane outside ADLs Comments: took shower standing up per his report,  still drives, orders [employer] a lot for groceries.     Hand Dominance   Dominant Hand: Right    Extremity/Trunk Assessment   Upper Extremity Assessment Upper Extremity Assessment: Defer to OT evaluation    Lower Extremity Assessment Lower Extremity Assessment: Generalized weakness;LLE deficits/detail RLE Deficits / Details: Generalized weakness, though limited assessment; able to extend hip and knee in supine, and adequate hip flexion to sit up LLE Deficits / Details: L BKA; able to extend knee almost to full extension       Communication   Communication: No difficulties;Other (comment) (Slow to answer questions)  Cognition Arousal/Alertness: Awake/alert Behavior During Therapy: Flat affect Overall Cognitive Status: No family/caregiver present to determine baseline cognitive functioning Area of Impairment: Orientation, Safety/judgement, Problem solving, Awareness, Memory                 Orientation Level: Disoriented to, Time, Situation   Memory: Decreased short-term memory Following Commands: Follows one step commands with increased time Safety/Judgement: Decreased awareness of safety, Decreased awareness of deficits Awareness: Intellectual Problem Solving:  Slow processing, Decreased initiation, Requires verbal cues General Comments: Pt not oriented to day of the week or reason for being in the hospital.  At times when therapist would ask him a questing he would demonstrate significant delay of 3-5 seconds with blank stare.        General Comments General comments (skin integrity, edema, etc.):   08/04/22 1500  Orthostatic Lying   BP- Lying (!) 81/61 (map 69)  Pulse- Lying 83  Orthostatic Sitting  BP- Sitting (!) 79/59 (67 map)  Pulse- Sitting 86       Exercises     Assessment/Plan    PT Assessment Patient needs continued PT services  PT Problem List Decreased strength;Decreased activity tolerance;Decreased balance;Decreased mobility;Decreased cognition       PT Treatment Interventions DME instruction;Gait training;Functional mobility training;Therapeutic activities;Therapeutic exercise;Balance training;Neuromuscular re-education;Patient/family education    PT Goals (Current goals can be found in the Care Plan section)  Acute Rehab PT Goals Patient Stated Goal: Did not state PT Goal Formulation: Patient unable to participate in goal setting Time For Goal Achievement: 08/18/22 Potential to Achieve Goals: Fair    Frequency Min 3X/week     Co-evaluation  AM-PAC PT "6 Clicks" Mobility  Outcome Measure Help needed turning from your back to your side while in a flat bed without using bedrails?: A Lot Help needed moving from lying on your back to sitting on the side of a flat bed without using bedrails?: A Lot Help needed moving to and from a bed to a chair (including a wheelchair)?: Total Help needed standing up from a chair using your arms (e.g., wheelchair or bedside chair)?: Total Help needed to walk in hospital room?: Total Help needed climbing 3-5 steps with a railing? : Total 6 Click Score: 8    End of Session   Activity Tolerance: Patient tolerated treatment well Patient left: in bed;with call  bell/phone within reach;with bed alarm set (bed in semi-cair position) Nurse Communication: Mobility status (and he ate his banana pudding) PT Visit Diagnosis: Other abnormalities of gait and mobility (R26.89);Muscle weakness (generalized) (M62.81)    Time: 9798-9211 PT Time Calculation (min) (ACUTE ONLY): 27 min   Charges:   PT Evaluation $PT Eval Moderate Complexity: 1 Mod PT Treatments $Therapeutic Activity: 8-22 mins        Roney Marion, PT  Acute Rehabilitation Services Office [phone]   Colletta [place] 08/04/2022, 5:08 PM

## 2022-08-05 DIAGNOSIS — G9341 Metabolic encephalopathy: Secondary | ICD-10-CM | POA: Diagnosis not present

## 2022-08-05 DIAGNOSIS — N401 Enlarged prostate with lower urinary tract symptoms: Secondary | ICD-10-CM | POA: Diagnosis not present

## 2022-08-05 DIAGNOSIS — E11649 Type 2 diabetes mellitus with hypoglycemia without coma: Secondary | ICD-10-CM | POA: Diagnosis not present

## 2022-08-05 DIAGNOSIS — R031 Nonspecific low blood-pressure reading: Secondary | ICD-10-CM | POA: Diagnosis not present

## 2022-08-05 LAB — GLUCOSE, CAPILLARY
Glucose-Capillary: 141 mg/dL — ABNORMAL HIGH (ref 70–99)
Glucose-Capillary: 126 mg/dL — ABNORMAL HIGH (ref 70–99)
Glucose-Capillary: 179 mg/dL — ABNORMAL HIGH (ref 70–99)
Glucose-Capillary: 247 mg/dL — ABNORMAL HIGH (ref 70–99)
Glucose-Capillary: 165 mg/dL — ABNORMAL HIGH (ref 70–99)

## 2022-08-05 LAB — CBC
HCT: 34.2 % — ABNORMAL LOW (ref 39.0–52.0)
Hemoglobin: 10.9 g/dL — ABNORMAL LOW (ref 13.0–17.0)
MCH: 29.5 pg (ref 26.0–34.0)
MCHC: 31.9 g/dL (ref 30.0–36.0)
MCV: 92.4 fL (ref 80.0–100.0)
Platelets: 202 10*3/uL (ref 150–400)
RBC: 3.7 MIL/uL — ABNORMAL LOW (ref 4.22–5.81)
RDW: 15.4 % (ref 11.5–15.5)
WBC: 11.6 10*3/uL — ABNORMAL HIGH (ref 4.0–10.5)
nRBC: 0 % (ref 0.0–0.2)

## 2022-08-05 MED ORDER — INSULIN ASPART 100 UNIT/ML IJ SOLN
0.0000 [IU] | Freq: Every day | INTRAMUSCULAR | Status: DC
  Administered 2022-08-05: 0 [IU] via SUBCUTANEOUS
  Administered 2022-08-08 – 2022-08-14 (×6): 2 [IU] via SUBCUTANEOUS

## 2022-08-05 MED ORDER — INSULIN ASPART 100 UNIT/ML IJ SOLN
0.0000 [IU] | Freq: Three times a day (TID) | INTRAMUSCULAR | Status: DC
  Administered 2022-08-06: 1 [IU] via SUBCUTANEOUS
  Administered 2022-08-06 – 2022-08-07 (×2): 2 [IU] via SUBCUTANEOUS
  Administered 2022-08-07: 1 [IU] via SUBCUTANEOUS
  Administered 2022-08-08 (×2): 2 [IU] via SUBCUTANEOUS
  Administered 2022-08-08: 1 [IU] via SUBCUTANEOUS
  Administered 2022-08-09: 3 [IU] via SUBCUTANEOUS
  Administered 2022-08-09: 1 [IU] via SUBCUTANEOUS
  Administered 2022-08-09 – 2022-08-11 (×6): 2 [IU] via SUBCUTANEOUS
  Administered 2022-08-12: 3 [IU] via SUBCUTANEOUS
  Administered 2022-08-12: 2 [IU] via SUBCUTANEOUS
  Administered 2022-08-12: 7 [IU] via SUBCUTANEOUS
  Administered 2022-08-13: 2 [IU] via SUBCUTANEOUS
  Administered 2022-08-13 (×2): 3 [IU] via SUBCUTANEOUS
  Administered 2022-08-14: 2 [IU] via SUBCUTANEOUS
  Administered 2022-08-14 (×2): 3 [IU] via SUBCUTANEOUS
  Administered 2022-08-15: 1 [IU] via SUBCUTANEOUS
  Administered 2022-08-15 (×2): 2 [IU] via SUBCUTANEOUS
  Administered 2022-08-16: 1 [IU] via SUBCUTANEOUS
  Administered 2022-08-16: 2 [IU] via SUBCUTANEOUS

## 2022-08-05 NOTE — Progress Notes (Signed)
RN spoke to [employer] with pt's permission to do so with updates. Pt stated she is a friend from [place]. Her number is [phone].

## 2022-08-05 NOTE — Progress Notes (Signed)
Daily Progress Note Intern Pager: [phone]  Patient name: Peter Yang Medical record number: [id number] Date of birth: 1956/05/29 Age: 66 y.o. Gender: male  Primary Care Provider: Clovia Cuff, MD Consultants: None Code Status: DNR  Pt Overview and Major Events to Date:  10/13-admitted  Assessment and Plan:  Peter Yang is a 66 y.o. male presenting with altered mental status.  PMHx includes BPH, CAD-NSTEMI in September, T2DM, HTN, HLD, OSA on CPAP, PAF on Eliquis, Thoracic aortic aneurysm, TIA in 2012, CHF-EF 40% with consistent bilateral pleural effusion presenting, recent admission with hematuria/prostatitis/hx of prostate cancer where he was discharged on foley catheter 07/23/2022-07/29/2022 (was given ertapenem for ESBL E coli and amoxicillin for Proteus intermediate to imipenem-discharged on Bactrim double strength BID for 6 weeks)  * Acute metabolic encephalopathy Likely multifactorial from hypoglycemia and underlying urinary tract infection. Passed SLP eval. A&Ox2 (self and place). -CBGs q4h -Neurochecks   UTI (urinary tract infection) Hx ESBL UTI, Foley in place. WBC trending down. Urine and blood cx NGTD but will continue to treat given significant history. COVID-positive. - TMP-SMX (10/14-11/14) per note from recent admission in [facility] -Patient should follow-up with ID and urology outpatient  Hypoglycemia associated with diabetes ([place]) Mostly in the 100s, lows of 52 and 60.  Discontinued fluids today now that he is taking p.o. -Hold home insulin and metformin -CBG q4h -Hypoglycemia protocol, D50 ordered as needed  Elevated troponin Hx of CAD and NSTEMI s/p 2 stents. Trop 967>591>638>466. EKG with some ST depression in V6, similar to previous. Hx of NSTEMI 1 month ago and April. Denies chest pain currently. - ASA '81mg'$  qd - plavix 75 mg daily (recommended DAPT for a year on last hospitalization)    Paroxysmal atrial fibrillation (HCC) Rate controlled  currently. -Eliquis 5 mg BID -Consider IV rate control if RVR -Keep K>4, Mag>2  Heart failure with mid-range ejection fraction ([place]) Last echo 06/15/2022 with EF of 40-45% with LV with regional wall motion abnormality, LA severly dilated, mod MVR.  Does not appear to be in acute exacerbation currently. BP still low and was given 1 L LR with improvement in MAPS. -Strict I/O's -Daily weights  Pressure ulcer of sacral region, stage 2 ([place]) Present on admission.  Wound care per [facility] recommendations.  Lab test positive for detection of COVID-19 virus Positive COVID test 10/13.  Does not appear to be overtly symptomatic. - consider antiviral treatment  PVD (peripheral vascular disease) ([place]) Cool right foot, abnormal ABI.  Patient denies pain - consider vascular surgery consult       FEN/GI: Reg diet PPx: Eliquis Dispo:SNF pending clinical improvement .   Subjective:  No acute concerns this morning.  Patient states he is feeling a bit better.  Denies CP/SOB, abdominal pain, leg pain, N/V.  Was able to eat breakfast this morning.  Objective: Temp:  [97.8 F (36.6 C)-98.8 F (37.1 C)] 98.2 F (36.8 C) (10/16 1145) Pulse Rate:  [76-97] 79 (10/16 1145) Resp:  [11-19] 17 (10/16 1145) BP: (83-100)/(58-63) 85/59 (10/16 1145) SpO2:  [91 %-98 %] 95 % (10/16 1145) Weight:  [101.1 kg] 101.1 kg (10/16 0500) Physical Exam: General: NAD, sitting in bed, responds in short sentences Cardiovascular: RRR no MRG Respiratory: CTAB normal WOB on RA Abdomen: Soft, NT/ND, normal BS Extremities: Right leg normal temperature, 2+ DP pulses.  LLE s/p BKA  Laboratory: Most recent CBC Lab Results  Component Value Date   WBC 11.6 (H) 08/05/2022   HGB 10.9 (L) 08/05/2022  HCT 34.2 (L) 08/05/2022   MCV 92.4 08/05/2022   PLT 202 08/05/2022   Most recent BMP    Latest Ref Rng & Units 08/04/2022    4:13 AM  BMP  Glucose 70 - 99 mg/dL 60   BUN 8 - 23 mg/dL 15   Creatinine 0.61 - 1.24 mg/dL  1.03   Sodium 135 - 145 mmol/L 135   Potassium 3.5 - 5.1 mmol/L 4.2   Chloride 98 - 111 mmol/L 105   CO2 22 - 32 mmol/L 21   Calcium 8.9 - 10.3 mg/dL 8.3      August Albino, MD 08/05/2022, 1:29 PM  PGY-1, [place] Intern pager: [phone], text pages welcome Secure chat group [place]

## 2022-08-05 NOTE — Hospital Course (Addendum)
Peter Yang is a 66 y.o.male with a history of CAD/NSTEMI, AAA, TIA, CHF, PAF on Eliquis, T2DM, HTN, HLD, recent admission with ESBL E. coli who was admitted to the family medicine teaching Service at [facility] for altered mental status. His hospital course is detailed below:  Acute metabolic encephalopathy/altered mental status Found down at home by EMS and presented with altered mental status.  Ammonia, UDS, EtOH, imaging unremarkable.  Thought to be most related to either hypoglycemia or underlying infectious process.  His AMS improved by day 2 of admission with correction of his blood sugar and initiation of abx. Remained stable for rest of admission.  UTI Patient with significant history of ESBL E. coli UTI, has indwelling Foley catheter.  Febrile on admission with elevated WBC count.  Also in the setting of COVID-positive.  Was started on empiric antibiotics.  Urine culture and blood culture negative.  However, treatment with oral Bactrim was continued (total 6-week course) per recent admission.  Urinary retention Pt presented with indwelling Foley catheter from previous admission. It was noted that patient should f/u outpatient for voiding trial. Foley was discontinued during this admission due to patient preference and improvement in mental status. Pt was restarted on home finasteride, and also started on Flomax. Bladder scans did not show evidence of continued urinary retention. Pt should follow up outpatient w/ urology.   Hematuria Patient noted to have macroscopic hematuria from his indwelling Foley catheter.  This was also noted during his recent admission in [facility].  His hemoglobin remained stable during this admission.  Patient should follow-up outpatient with urology.  Hypoglycemia Held diabetes medications due to hypoglycemia and altered mental status on admission.  Started on D5 fluids with improvement in blood sugar levels. After normalization of blood sugar levels, patient was  started on SSI. Pt was also started on metformin which was continued at discharge.  Elevated troponin Trop 435>532>273>326.  History of CAD and NSTEMI status post 2 stents.  EKG similar to prior. Patient remained asymptomatic for ACS. Continued ASA and Plavix (DAPT x1 year per recent hospitalization).  Paroxysmal atrial fibrillation Beta-blocker held during this admission due to soft blood pressures.  Continued Eliquis.   Hypotension Soft blood pressures during this admission.  Started on midodrine 10 mg 3 times daily during recent hospital admission.  Held beta-blocker and continued midodrine. Patient was stable and asymptomatic with blood pressures as low as 70s/40s - was able to participate in physical therapy and did not have any dizziness, syncope, or lightheadedness.   CHF Held ACE inhibitor, metoprolol, empagliflozin per recent hospitalization DC summary.  Positive COVID Indeterminant patchy infiltrates on chest CT.  Peripheral vascular disease ABI performed with findings as below. RLE cold to touch on admission but improved during his stay. May consider vascular referral.   PCP Follow-up Recommendations: Ensure f/u with urology for urinary retention/hematuria and ID for ESBL UTI -- Foley dc'd with successful voiding trial during this admission Ensure cardiology follow-up for CHF and PAF F/u diabetes mgmt - initially held due to hypoglycemia during this admission - sent home with metformin Abnormal ABI of RLE. Consider vascular referral F/u blood pressure management - continued midodrine, remained asymptomatic with pressures in the 70s/40s    PERTINENT IMAGING  CXR IMPRESSION: Findings are most consistent with moderate pulmonary edema and small, left greater than right pleural effusions. Superimposed infection is possible.  CT HEAD WO CONTRAST IMPRESSION: 1. No acute abnormality. 2. Stable mild diffuse cerebral and cerebellar atrophy. 3. Stable mild chronic small  vessel white matter ischemic changes in both cerebral hemispheres. 4. 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 5. Interval mild sphenoid sinusitis.  CT CHEST IMPRESSION: 1. Moderate-sized bilateral pleural effusions with an interval increase in size. 2. Increased patchy alveolar and interstitial opacity in the dependent portions of both lungs, compatible with pulmonary edema with associated biatrial enlargement. 3. Cholelithiasis. 4. Diffuse pancreatic atrophy compatible with the history of diabetes. 5. Dense calcific coronary artery and aortic atherosclerosis.   Aortic Atherosclerosis (ICD10-I70.0).   VAS [place] ABI Summary:  Right: Resting right ankle-brachial index indicates noncompressible right  lower extremity arteries. The right toe-brachial index is abnormal.  Today's examination was limited due to patient movement/altered mental  status.   Left:   S/p left BKA.

## 2022-08-05 NOTE — TOC Initial Note (Signed)
Transition of Care [facility]) - Initial/Assessment Note    Patient Details  Name: Peter Yang MRN: [id number] Date of Birth: 10/12/1956  Transition of Care [facility]) CM/SW Contact:    Benard Halsted, LCSW Phone Number: 08/05/2022, 4:34 PM  Clinical Narrative:                 CSW received consult for possible SNF placement at time of discharge. CSW spoke with patient. Patient reported that he lives alone and has no family. Patient expressed understanding of PT recommendation and is agreeable to SNF placement at time of discharge. CSW discussed insurance authorization process and will provide Medicare SNF ratings list. CSW will send out referrals for review and provide bed offers as available.   Skilled Nursing Rehab Facilities-   [email or web address]   Ratings out of 5 stars (the highest)   Name Address  Phone # [place] Inspection Overall  [facility] [address], [place] '5 5 2 4  '$ [facility]  [address], [place] [phone] '4 2 5 5  '$ [facility] [place], [place] '1 1 1 1  '$ [facility], [place] '2 2 4 4  '$ [facility] [address], [place] '1 1 2 1  '$ [place] [address] [place] [phone] '3 2 4 4  '$ [facility] [address], [place] '5 1 3 3  '$ [facility] [address], [place] '5 2 3 4  '$ [address] ([place]) [place], [place] [phone] '4 1 2 1  '$ [facility] [address], [place] [phone] '4 1 1 1  '$ [facility] [address], [facility] [phone] '3 1 2 1  '$ [facility] ([place]) [place]. [place], [place] [phone] '4 1 1 1  '$ Dustin Flock [address] [phone] '4 2 4 4          '$ [facility] [place] '4 1 3 2  '$ [facility] [place] [address] [place] '3 1 5 4  '$ [address] [place], [place] [phone] '1 1 1 1  '$ [facility] [address], [employer] [phone] '2 2 3 3          '$ [address] (no [facility]) [address], [place] [phone] '5 5 5 5  '$ [facility] (No Humana) [address], [place] '4 1 4 3  '$ [facility] (No UHC) [place], [place] [phone] '5 5 5 5  '$ [facility] [address], [employer] [phone] '2 3 5 5  '$ [place] [facility] [address], [place] '1 1 2 1  '$ [facility] [address] [place] [phone] '3 1 1 1  '$ [facility] [employer], [place] '5 2 4 5  '$ [facility]  [address], [place] '2 2 1 1  '$ [facility] [address], [place] '3 2 1 1  '$ [facility] [place], [place] '2 2 2 2          '$ [facility] [address], [place] [phone] '1 1 1 1  '$ [place] [address] [place]  [phone] '2 4 2 2  '$ [facility] [place] [address], [place] [phone] '4 2 3 3  '$ [facility] [place], [place]  [phone] '2 1 1 1  '$ [facility] (No Humana) [address] [place] '2 2 3 3  '$ [facility] [address] [place] [phone] '2 1 1 1          '$ [facility] [place], [place] '5 4 5 5  '$ [facility])  [address], [place] '2 1 2 1  '$ [facility]) [place] [address], [place] '3 1 4 3  '$ [place] [address] [place] '3 3 4 4  '$ [address] [place], [place] '2 3 1 1  '$ [facility]) [address] [place] [phone] '2 1 4 3     '$ Expected Discharge Plan: [place] Barriers to Discharge: Continued Medical Work up, Ship broker, SNF Pending bed offer, SNF Covid   Patient Goals and CMS Choice Patient states their goals for this hospitalization and ongoing recovery are:: Get stronger CMS Medicare.gov Compare Post Acute Care list provided to:: Patient Choice offered to / list presented to : Patient  Expected Discharge Plan and Services Expected Discharge Plan: [place] In-house Referral: Clinical Social Work   Post Acute Care Choice: [place] Living arrangements for the past 2 months: [place]                                      Prior Living Arrangements/Services Living arrangements for the past 2 months: [place] Lives with:: Self Patient language and need for interpreter reviewed:: Yes Do you feel safe going back to the place where you live?: Yes      Need for Family Participation in Patient Care: Yes (Comment) Care giver support system in place?: No (comment) Current home services: DME, Home PT, Home OT, Home RN Criminal Activity/Legal Involvement Pertinent to Current Situation/Hospitalization: No - Comment as needed  Activities of Daily Living      Permission Sought/Granted Permission sought to share information with : Facility Sport and exercise psychologist, Family Supports Permission granted to share information with : Yes, Verbal Permission Granted     Permission granted to share info w AGENCY: SNFs        Emotional Assessment Appearance:: Appears stated age Attitude/Demeanor/Rapport: Gracious Affect (typically observed): Accepting, Appropriate Orientation: : Oriented to Place, Oriented to Self, Oriented to  Time, Oriented to Situation Alcohol / Substance Use: Not Applicable Psych Involvement: No (comment)  Admission diagnosis:  Altered mental status [R41.82] Hypoglycemia [E16.2] Altered mental status, unspecified altered mental status type  [R41.82] Patient Active Problem List   Diagnosis Date Noted   Impaired functional mobility, balance, gait, and endurance 08/03/2022   Obstructive sleep apnea syndrome 08/03/2022   Pleural effusion, bilateral 08/03/2022   Catheter-associated urinary tract infection ([place]) 08/03/2022   Fever 08/03/2022   Lab test positive for detection of COVID-19 virus 08/03/2022   Urinary retention 08/03/2022   Mitral valve regurgitation 08/03/2022   Hypoglycemia    Acute metabolic encephalopathy 46/96/2952   Elevated troponin 08/02/2022   UTI (urinary tract infection) 08/02/2022   Thoracic ascending aortic aneurysm ([place]) 07/23/2022   Borderline low blood pressure determined by examination 07/04/2022   Hypoglycemia associated with diabetes ([place]) 06/25/2022   Hematuria 06/25/2022   History of non-ST elevation myocardial infarction (NSTEMI) 06/15/2022   Paroxysmal atrial fibrillation ([place]) 06/15/2022   Dyslipidemia 06/15/2022   PVD (  peripheral vascular disease) ([place]) 06/15/2022   Pressure ulcer of sacral region, stage 2 ([place]) 06/15/2022   Heart failure with mid-range ejection fraction ([place]) 06/15/2022   DNR (do not resuscitate) 06/15/2022   Ischemic cardiomyopathy 02/12/2022   Need for assistance with personal care 08/03/2021   Diabetic polyneuropathy associated with type 2 diabetes mellitus ([place]) 07/28/2021   Prolonged QT interval 07/28/2021   Moderate protein-calorie malnutrition ([place]) 06/29/2021   Chronic insomnia 03/31/2021   BPH with obstruction/lower urinary tract symptoms 02/02/2018   Hyperlipidemia 04/15/2016   Vitamin D deficiency 04/09/2016   Essential hypertension 01/25/2016   PCP:  Clovia Cuff, MD Pharmacy:   [place] - [address] [place] [address] [place] [address] Phone: [phone] Fax: [phone]  [facility] [address] [place] [address] Phone:  [phone] Fax: [phone]  [id number] [place] - [address] [place] [address]Phone: [phone]: [phone]    Social Determinants of Health (SDOH) Interventions    Readmission Risk Interventions     No data to display

## 2022-08-05 NOTE — Progress Notes (Signed)
  Transition of Care [facility]) Screening Note   Patient Details  Name: Peter Yang Date of Birth: [birth year]   [facility]) CM/SW Contact:    Cyndi Bender, RN Phone Number: 08/05/2022, 9:15 AM    Transition of Care Department [facility]) has reviewed patient and no TOC needs have been identified at this time. We will continue to monitor patient advancement through interdisciplinary progression rounds. If new patient transition needs arise, please place a TOC consult.

## 2022-08-05 NOTE — NC FL2 (Signed)
[place] LEVEL OF CARE SCREENING TOOL     IDENTIFICATION  Patient Name: Peter Yang Birthdate: 11-17-1955 Sex: male Admission Date (Current Location): 08/02/2022  [facility] and [place] Number:  Herbalist and Address:  [facility] [place]. [facility], [place] [address] [place], [place] [address]      Provider Number: [id number]  Attending Physician Name and Address:  McDiarmid, Blane Ohara, MD  Relative Name and Phone Number:       Current Level of Care: Hospital Recommended Level of Care: [place] Prior Approval Number:    Date Approved/Denied:   PASRR Number: [id number]  Discharge Plan: SNF    Current Diagnoses: Patient Active Problem List   Diagnosis Date Noted   Impaired functional mobility, balance, gait, and endurance 08/03/2022   Obstructive sleep apnea syndrome 08/03/2022   Pleural effusion, bilateral 08/03/2022   Catheter-associated urinary tract infection ([place]) 08/03/2022   Fever 08/03/2022   Lab test positive for detection of COVID-19 virus 08/03/2022   Urinary retention 08/03/2022   Mitral valve regurgitation 08/03/2022   Hypoglycemia    Acute metabolic encephalopathy 25/00/3704   Elevated troponin 08/02/2022   UTI (urinary tract infection) 08/02/2022   Thoracic ascending aortic aneurysm ([place]) 07/23/2022   Borderline low blood pressure determined by examination 07/04/2022   Hypoglycemia associated with diabetes ([place]) 06/25/2022   Hematuria 06/25/2022   History of non-ST elevation myocardial infarction (NSTEMI) 06/15/2022   Paroxysmal atrial fibrillation ([place]) 06/15/2022   Dyslipidemia 06/15/2022   PVD (peripheral vascular disease) ([place]) 06/15/2022   Pressure ulcer of sacral region, stage 2 ([place]) 06/15/2022   Heart failure with mid-range ejection fraction ([place]) 06/15/2022   DNR (do not resuscitate) 06/15/2022   Ischemic cardiomyopathy 02/12/2022   Need for assistance with personal care 08/03/2021   Diabetic  polyneuropathy associated with type 2 diabetes mellitus ([place]) 07/28/2021   Prolonged QT interval 07/28/2021   Moderate protein-calorie malnutrition ([place]) 06/29/2021   Chronic insomnia 03/31/2021   BPH with obstruction/lower urinary tract symptoms 02/02/2018   Hyperlipidemia 04/15/2016   Vitamin D deficiency 04/09/2016   Essential hypertension 01/25/2016    Orientation RESPIRATION BLADDER Height & Weight     Self, Time, Situation, Place  Normal Incontinent, Indwelling catheter Weight: 222 lb 14.2 oz (101.1 kg) Height:  '6\' 4"'$  (193 cm)  BEHAVIORAL SYMPTOMS/MOOD NEUROLOGICAL BOWEL NUTRITION STATUS      Incontinent Diet  AMBULATORY STATUS COMMUNICATION OF NEEDS Skin     Verbally PU Stage and Appropriate Care, Other (Comment) (Stage II on buttocks;MASD on abdomen)                       Personal Care Assistance Level of Assistance  Bathing, Feeding, Dressing           Functional Limitations Info  Sight Sight Info: Impaired        SPECIAL CARE FACTORS FREQUENCY  PT (By licensed PT), OT (By licensed OT)     PT Frequency: 5x/week OT Frequency: 5x/week            Contractures Contractures Info: Not present    Additional Factors Info  Code Status, Allergies, Isolation Precautions Code Status Info: DNR Allergies Info: NKA     Isolation Precautions Info: COVID + 08/02/22     Current Medications (08/05/2022):  This is the current hospital active medication list Current Facility-Administered Medications  Medication Dose Route Frequency Provider Last Rate Last Admin   apixaban (ELIQUIS) tablet 5 mg  5  mg Oral BID Zola Button, MD   5 mg at 08/05/22 3968   aspirin chewable tablet 81 mg  81 mg Oral Daily Gerrit Heck, MD   81 mg at 08/05/22 0815   Chlorhexidine Gluconate Cloth 2 % PADS 6 each  6 each Topical Daily McDiarmid, Blane Ohara, MD   6 each at 08/05/22 0815   clopidogrel (PLAVIX) tablet 75 mg  75 mg Oral Daily McDiarmid, Blane Ohara, MD   75 mg at 08/05/22 0814    dextrose 50 % solution 50 mL  1 ampule Intravenous PRN Zola Button, MD   50 mL at 08/03/22 0932   midodrine (PROAMATINE) tablet 10 mg  10 mg Oral TID WC McDiarmid, Blane Ohara, MD   10 mg at 08/05/22 1300   sulfamethoxazole-trimethoprim (BACTRIM DS) 800-160 MG per tablet 2 tablet  2 tablet Oral Q12H McDiarmid, Blane Ohara, MD   2 tablet at 08/05/22 8648     Discharge Medications: Please see discharge summary for a list of discharge medications.  Relevant Imaging Results:  Relevant Lab Results:   Additional Information SSN: [id number]  Benard Halsted, [place]

## 2022-08-05 NOTE — Progress Notes (Addendum)
9:36am-CSW following for discharge needs. Of note, patient tested positive for COVID on 10/13, which may be a barrier for SNF placement but will look into options.  Patient not answering room phone. RN to help patient.  Gilmore Laroche, MSW, [facility]

## 2022-08-06 DIAGNOSIS — E162 Hypoglycemia, unspecified: Secondary | ICD-10-CM | POA: Diagnosis not present

## 2022-08-06 DIAGNOSIS — Z7409 Other reduced mobility: Secondary | ICD-10-CM | POA: Diagnosis not present

## 2022-08-06 DIAGNOSIS — N401 Enlarged prostate with lower urinary tract symptoms: Secondary | ICD-10-CM | POA: Diagnosis not present

## 2022-08-06 DIAGNOSIS — E11649 Type 2 diabetes mellitus with hypoglycemia without coma: Secondary | ICD-10-CM | POA: Diagnosis not present

## 2022-08-06 LAB — CBC
HCT: 33.1 % — ABNORMAL LOW (ref 39.0–52.0)
Hemoglobin: 10.7 g/dL — ABNORMAL LOW (ref 13.0–17.0)
MCH: 29.5 pg (ref 26.0–34.0)
MCHC: 32.3 g/dL (ref 30.0–36.0)
MCV: 91.2 fL (ref 80.0–100.0)
Platelets: 219 10*3/uL (ref 150–400)
RBC: 3.63 MIL/uL — ABNORMAL LOW (ref 4.22–5.81)
RDW: 15.4 % (ref 11.5–15.5)
WBC: 10.4 10*3/uL (ref 4.0–10.5)
nRBC: 0 % (ref 0.0–0.2)

## 2022-08-06 LAB — BASIC METABOLIC PANEL
Anion gap: 8 (ref 5–15)
BUN: 17 mg/dL (ref 8–23)
CO2: 19 mmol/L — ABNORMAL LOW (ref 22–32)
Calcium: 8 mg/dL — ABNORMAL LOW (ref 8.9–10.3)
Chloride: 108 mmol/L (ref 98–111)
Creatinine, Ser: 1.26 mg/dL — ABNORMAL HIGH (ref 0.61–1.24)
GFR, Estimated: >60 mL/min (ref >60)
Glucose, Bld: 153 mg/dL — ABNORMAL HIGH (ref 70–99)
Potassium: 4 mmol/L (ref 3.5–5.1)
Sodium: 135 mmol/L (ref 135–145)

## 2022-08-06 LAB — GLUCOSE, CAPILLARY
Glucose-Capillary: 111 mg/dL — ABNORMAL HIGH (ref 70–99)
Glucose-Capillary: 127 mg/dL — ABNORMAL HIGH (ref 70–99)
Glucose-Capillary: 173 mg/dL — ABNORMAL HIGH (ref 70–99)
Glucose-Capillary: 140 mg/dL — ABNORMAL HIGH (ref 70–99)

## 2022-08-06 NOTE — Progress Notes (Signed)
Physical Therapy Treatment Patient Details Name: Peter Yang MRN: [id number] DOB: [birth year] Today's Date: 08/06/2022   History of Present Illness 66 year old male presenting with acute metabolic encephalopathy in the setting of hypoglycemia and sepsis from suspected urinary source.  Also with positive COVID test.  PMH BPH; DM; HTN; PVD s/p L BKA; CAD s/p stent; HLD, hx of TIA, OSA who was recently discharged on 8/29 from [facility] for a NSTEMI and hypoglycemia.    PT Comments    Pt received in supine, agreeable to therapy session and with good participation and fair tolerance for bed mobility and transfer training. Pt performed bed mobility and scooting with +2 mod to maxA and unable to stand fully upright despite x3 attempts from elevated bed height and use of RW vs Stedy. Pt reports his phone is dead (iPhone) and can't call anyone to bring in prosthetic for him, RN notified to see if loaner charger is available to him. Pt continues to benefit from PT services to progress toward functional mobility goals.   Recommendations for follow up therapy are one component of a multi-disciplinary discharge planning process, led by the attending physician.  Recommendations may be updated based on patient status, additional functional criteria and insurance authorization.  Follow Up Recommendations  Skilled nursing-short term rehab (<3 hours/day) Can patient physically be transported by private vehicle: No   Assistance Recommended at Discharge Frequent or constant Supervision/Assistance  Patient can return home with the following Two people to help with walking and/or transfers   Equipment Recommendations  Other (comment) (consider hospital bed)    Recommendations for Other Services       Precautions / Restrictions Precautions Precautions: Fall Precaution Comments: Airborne/Contact Covid precs; L BKA no prosthesis with him currently, pt unsure if anyone can bring it in for him. Restrictions Weight  Bearing Restrictions: No     Mobility  Bed Mobility Overal bed mobility: Needs Assistance Bed Mobility: Supine to Sit, Sit to Supine     Supine to sit: Mod assist, +2 for physical assistance Sit to supine: Mod assist, +2 for safety/equipment   General bed mobility comments: Pt needed assist with bringing trunk up to sitting and then scooting to the right to get to the edge of the bed.    Transfers Overall transfer level: Needs assistance Equipment used: Ambulation equipment used Charlaine Dalton) Transfers: Sit to/from Stand Sit to Stand: +2 physical assistance, Mod assist, Max assist (Stedy and RW for partial stand)           General transfer comment: Pt only able to stand partially (75%) with the RW and then less than 50% with the Stedy x 2 more trials, bed height elevated multiple inches prior to attempts as pt is 24f 1"        Balance       Sitting balance - Comments: pt able to lean forward to doff sock so PTA could check heel dressing       Standing balance comment: Pt unable to stand up with use of the RW, stedy or with therapist assist this session.                            Cognition Arousal/Alertness: Awake/alert Behavior During Therapy: Flat affect Overall Cognitive Status: No family/caregiver present to determine baseline cognitive functioning Area of Impairment: Orientation                 Orientation Level: Time     Following  Commands: Follows one step commands with increased time   Awareness: Intellectual Problem Solving: Slow processing, Decreased initiation, Requires verbal cues, Requires tactile cues General Comments: Pt not oriented to day of the week but was oriented to month.  Needed increased time and mod cueing to come up with the holiday in the month of October. Overall, slower initiation and processing of instructions at times with attempts to mobilize from supine and for attempted standing.        Exercises      General  Comments General comments (skin integrity, edema, etc.): BP 94/63 (72) and stable in seated posture when BP reassessed; denies dizziness when pausing and with delayed responses; HR 80's bpm and SpO2 96-97% on RA throughout      Pertinent Vitals/Pain Pain Assessment Pain Assessment: No/denies pain Pain Intervention(s): Monitored during session, Repositioned     PT Goals (current goals can now be found in the care plan section) Acute Rehab PT Goals Patient Stated Goal: Did not state PT Goal Formulation: Patient unable to participate in goal setting Time For Goal Achievement: 08/18/22 Progress towards PT goals: Progressing toward goals (slowly)    Frequency    Min 3X/week      PT Plan Current plan remains appropriate    Co-evaluation PT/OT/SLP Co-Evaluation/Treatment: Yes Reason for Co-Treatment: For patient/therapist safety;To address functional/ADL transfers PT goals addressed during session: Mobility/safety with mobility;Balance;Proper use of DME;Strengthening/ROM OT goals addressed during session: ADL's and self-care      AM-PAC PT "6 Clicks" Mobility   Outcome Measure  Help needed turning from your back to your side while in a flat bed without using bedrails?: A Lot Help needed moving from lying on your back to sitting on the side of a flat bed without using bedrails?: A Lot Help needed moving to and from a bed to a chair (including a wheelchair)?: Total Help needed standing up from a chair using your arms (e.g., wheelchair or bedside chair)?: Total Help needed to walk in hospital room?: Total Help needed climbing 3-5 steps with a railing? : Total 6 Click Score: 8    End of Session Equipment Utilized During Treatment: Gait belt Activity Tolerance: Patient tolerated treatment well Patient left: in bed;with call bell/phone within reach;with bed alarm set;Other (comment) (bed in chair posture) Nurse Communication: Mobility status;Other (comment) (pt requesting a  charger to use with iphone, his phone is dead so he can't reach out to anyone to bring his prosthetic in; currently would need +2 for lateral scoot transfer OOB to chair) PT Visit Diagnosis: Other abnormalities of gait and mobility (R26.89);Muscle weakness (generalized) (M62.81)     Time: 8242-3536 PT Time Calculation (min) (ACUTE ONLY): 29 min  Charges:  $Therapeutic Activity: 8-22 mins                     Peter Cancio P., PTA Acute Rehabilitation Services Secure Chat Preferred 9a-5:30pm Office: [place] 08/06/2022, 5:51 PM

## 2022-08-06 NOTE — TOC Progression Note (Signed)
Transition of Care [facility]) - Progression Note    Patient Details  Name: Peter Yang MRN: [id number] Date of Birth: [birth year]  Transition of Care [facility]) CM/SW [place], LCSW Phone Number: 08/06/2022, 3:52 PM  Clinical Narrative:    CSW confirmed with SNFs their COVID requirements and presented bed offers to patient [facility] cannot accept COVID at this time, [facility] requires 5 days after the first positive). Patient reported that he does not want [facility] as he has been there before. He is requesting a private room so CSW checking with [facility] and [place]. Then will ask facility to start insurance authorization process.    Expected Discharge Plan: [place] Barriers to Discharge: Continued Medical Work up, Ship broker, SNF Pending bed offer, SNF Covid  Expected Discharge Plan and Services Expected Discharge Plan: [place] In-house Referral: Clinical Social Work   Post Acute Care Choice: [place] Living arrangements for the past 2 months: [place]                                       Social Determinants of Health (SDOH) Interventions    Readmission Risk Interventions     No data to display

## 2022-08-06 NOTE — Progress Notes (Signed)
Occupational Therapy Treatment Patient Details Name: Peter Yang MRN: [id number] DOB: 1956-02-09 Today's Date: 08/06/2022   History of present illness 66 year old male presenting with acute metabolic encephalopathy in the setting of hypoglycemia and sepsis from suspected urinary source.  Also with positive COVID test.  PMH BPH; DM; HTN; PVD s/p L BKA; CAD s/p stent; HLD, hx of TIA, OSA who was recently discharged on 8/29 from [facility] for a NSTEMI and hypoglycemia.   OT comments  Pt continues to need mod +2 assist for for supine to sit and for partial sit to stand as he could not achieve full with use of the RW or the Steady.  He does not have his LLE prosthesis, but is checking to see if he can get someone to bring it.  Max assist also needed for scooting up to the top of the bed with total assist for donning gripper sock and velcro shoe on the RLE.  Feel he will continue to benefit from acute care OT at this time but will need SNF for follow-up rehab secondary to not having assist at home.    Recommendations for follow up therapy are one component of a multi-disciplinary discharge planning process, led by the attending physician.  Recommendations may be updated based on patient status, additional functional criteria and insurance authorization.    Follow Up Recommendations  Skilled nursing-short term rehab (<3 hours/day)    Assistance Recommended at Discharge Frequent or constant Supervision/Assistance  Patient can return home with the following  Two people to help with walking and/or transfers;A lot of help with bathing/dressing/bathroom;Assistance with cooking/housework;Assist for transportation;Help with stairs or ramp for entrance;Direct supervision/assist for medications management   Equipment Recommendations  Other (comment) (TBD next venue of care)       Precautions / Restrictions Precautions Precautions: Fall Precaution Comments: L BKA no prosthesis with him  currently. Restrictions Weight Bearing Restrictions: No       Mobility Bed Mobility Overal bed mobility: Needs Assistance Bed Mobility: Supine to Sit, Sit to Supine     Supine to sit: Mod assist, +2 for physical assistance Sit to supine: Mod assist   General bed mobility comments: Pt needed assist with bringing trunk up to sitting and then scooting to the right to get to the edge of the bed.    Transfers Overall transfer level: Needs assistance Equipment used:  Charlaine Dalton) Transfers: Sit to/from Stand Sit to Stand: +2 physical assistance, Mod assist (Stedy and RW for partial stand)           General transfer comment: Pt only able to stand partially (75%) with the RW and then less than 50% with the [facility]     Balance Overall balance assessment: Needs assistance Sitting-balance support: No upper extremity supported, Feet supported Sitting balance-Leahy Scale: Fair     Standing balance support: Reliant on assistive device for balance Standing balance-Leahy Scale: Zero Standing balance comment: Pt unable to stand up with use of the RW, steady or with therapist assist this session.                           ADL either performed or assessed with clinical judgement   ADL Overall ADL's : Needs assistance/impaired                       Lower Body Dressing Details (indicate cue type and reason): total assist for donning gripper sock and velcro post op shoe on the  right foot.               General ADL Comments: Attempted sit to stand with the RW and with the Steady.  He was only able to stand partially with the RW and less than 1/2 way with the Steady on 3 attempts.  Max assist +2 for scooting up the EOB as well prior to laying down.      Cognition Arousal/Alertness: Awake/alert Behavior During Therapy: Flat affect Overall Cognitive Status: No family/caregiver present to determine baseline cognitive functioning Area of Impairment: Orientation                  Orientation Level: Time     Following Commands: Follows one step commands with increased time   Awareness: Intellectual Problem Solving: Slow processing, Decreased initiation, Requires verbal cues, Requires tactile cues General Comments: Pt not oriented to day of the week but was oriented to month.  Needed increased time and mod questioning cueing to come up with the holiday in the month of October.  Overall, slower initiation and processing of instructions at times with attempts to mobilize from supine and for attempted standing.                   Pertinent Vitals/ Pain       Pain Assessment Pain Assessment: No/denies pain            Progress Toward Goals  OT Goals(current goals can now be found in the care plan section)  Progress towards OT goals: Progressing toward goals  Acute Rehab OT Goals Time For Goal Achievement: 08/17/22 Potential to Achieve Goals: Good  Plan Discharge plan remains appropriate    Co-evaluation    PT/OT/SLP Co-Evaluation/Treatment: Yes     OT goals addressed during session: ADL's and self-care      AM-PAC OT "6 Clicks" Daily Activity     Outcome Measure   Help from another person eating meals?: None Help from another person taking care of personal grooming?: A Little Help from another person toileting, which includes using toliet, bedpan, or urinal?: Total Help from another person bathing (including washing, rinsing, drying)?: Total Help from another person to put on and taking off regular upper body clothing?: A Little Help from another person to put on and taking off regular lower body clothing?: Total 6 Click Score: 13    End of Session Equipment Utilized During Treatment: Gait belt;Other (comment);Rolling walker (2 wheels) Charlaine Dalton)  OT Visit Diagnosis: Muscle weakness (generalized) (M62.81);Unsteadiness on feet (R26.81);Other symptoms and signs involving cognitive function;Repeated falls (R29.6)   Activity  Tolerance Patient tolerated treatment well   Patient Left in bed;with call bell/phone within reach;with bed alarm set   Nurse Communication Mobility status        Time: 3419-3790 OT Time Calculation (min): 28 min  Charges: OT General Charges $OT Visit: 1 Visit OT Treatments $Self Care/Home Management : 8-22 mins  Kylian Loh OTR/L 08/06/2022, 5:18 PM

## 2022-08-06 NOTE — Assessment & Plan Note (Addendum)
Urine continues to have macroscopic blood without dysuria. Hemoglobin stable.  -continue to f/u CBC -Cont eliquis and DAPT for now, unless hgb worsens

## 2022-08-06 NOTE — Progress Notes (Signed)
Patients foley output appears dark red. MD notified at this time.No new orders. Will continue to monitor output.

## 2022-08-06 NOTE — Progress Notes (Signed)
Daily Progress Note Intern Pager: [phone]  Patient name: Peter Yang Medical record number: [id number] Date of birth: [birth year] Age: 66 y.o. Gender: male  Primary Care Provider: Clovia Cuff, MD Consultants: None Code Status: DNR  Pt Overview and Major Events to Date:  10/13-admitted  Assessment and Plan:  Peter Yang is a 66 y.o. male presenting with altered mental status, resolved now and medically clear for SNF.    PMHx includes BPH, CAD-NSTEMI in September, T2DM, HTN, HLD, OSA on CPAP, PAF on Eliquis, Thoracic aortic aneurysm, TIA in 2012, CHF-EF 40% with consistent bilateral pleural effusion, recent admission with hematuria/prostatitis/hx of prostate cancer where he was discharged on foley catheter 07/23/2022-07/29/2022 (was given ertapenem for ESBL E coli and amoxicillin for Proteus intermediate to imipenem-discharged on Bactrim double strength BID for 6 weeks)   * Acute metabolic encephalopathy resolved. Likely multifactorial from hypoglycemia and underlying urinary tract infection. Passed SLP eval. A&Ox3 -CBGs q4h    UTI (urinary tract infection) Hx ESBL UTI, Foley in place. WBC normalized. Urine and blood cx NGTD but will continue to treat given significant history. COVID-positive. - TMP-SMX (10/14-11/14) per note from recent admission in [facility] -Patient should follow-up with ID and urology outpatient  Hypoglycemia associated with diabetes ([place]) Initially p/w hypoglycemia, thought to be a cause of his AMS. Hypoglycemia is now resolved. -Restarted SSI -CBG q4h -Hypoglycemia protocol, D50 ordered as needed  Elevated troponin Hx of CAD and NSTEMI s/p 2 stents. Trop 277>412>878>676. EKG with some ST depression in V6, similar to previous. Hx of NSTEMI 1 month ago and April. Denies chest pain currently. - ASA '81mg'$  qd - plavix 75 mg daily (recommended DAPT for a year on last hospitalization)    Hematuria Noted to have macroscopic hematuria, foley in  place. Denies dysuria. On bactrim as above -outpatient f/u with urology -continue to f/u CBC -Cont eliquis and DAPT for now, unless hgb worsens  Paroxysmal atrial fibrillation (HCC) Rate controlled currently. -Eliquis 5 mg BID -Consider IV rate control if RVR -Keep K>4, Mag>2  Heart failure with mid-range ejection fraction ([place]) Last echo 06/15/2022 with EF of 40-45% with LV with regional wall motion abnormality, LA severly dilated, mod MVR.  Does not appear to be in acute exacerbation currently. BP still low and was given 1 L LR with improvement in MAPS. -Strict I/O's -Daily weights  Pressure ulcer of sacral region, stage 2 ([place]) Present on admission.  Wound care per WOC recommendations.  Lab test positive for detection of COVID-19 virus Positive COVID test 10/13.  Does not appear to be overtly symptomatic. - consider antiviral treatment  PVD (peripheral vascular disease) ([place]) Cool right foot, abnormal ABI.  Patient denies pain - consider vascular surgery consult       FEN/GI: Regular diet PPx: Eliquis Dispo:SNF  pending placement . Medically stable  Subjective:  Feeling well this morning, no concerns.  Denies pain, shortness of breath, N/V, headaches.  Objective: Temp:  [97.4 F (36.3 C)-98.7 F (37.1 C)] 98.4 F (36.9 C) (10/17 0331) Pulse Rate:  [77-87] 79 (10/17 0331) Resp:  [9-20] 15 (10/17 0331) BP: (82-89)/(57-66) 89/62 (10/17 0331) SpO2:  [92 %-97 %] 95 % (10/17 0331) Weight:  [100.9 kg] 100.9 kg (10/17 0421) Physical Exam: General: NAD, alert, more responsive than yesterday Cardiovascular: irregularly irregular rhythm, normal rate, no m/r/g Respiratory: ctab on  RA Abdomen: soft NT/ND Extremities: RLE normal temperature with 2+ DP pulse  Laboratory: Most recent CBC Lab Results  Component Value Date  WBC 10.4 08/06/2022   HGB 10.7 (L) 08/06/2022   HCT 33.1 (L) 08/06/2022   MCV 91.2 08/06/2022   PLT 219 08/06/2022   Most recent BMP     Latest Ref Rng & Units 08/06/2022    3:07 AM  BMP  Glucose 70 - 99 mg/dL 153   BUN 8 - 23 mg/dL 17   Creatinine 0.61 - 1.24 mg/dL 1.26   Sodium 135 - 145 mmol/L 135   Potassium 3.5 - 5.1 mmol/L 4.0   Chloride 98 - 111 mmol/L 108   CO2 22 - 32 mmol/L 19   Calcium 8.9 - 10.3 mg/dL 8.0       August Albino, MD 08/06/2022, 12:20 PM  PGY-1, [place] Intern pager: [phone], text pages welcome Secure chat group [place]

## 2022-08-06 NOTE — Plan of Care (Signed)
  Problem: Education: Goal: Knowledge of General Education information will improve Description: Including pain rating scale, medication(s)/side effects and non-pharmacologic comfort measures Outcome: Progressing   Problem: Clinical Measurements: Goal: Ability to maintain clinical measurements within normal limits will improve Outcome: Progressing   Problem: Nutrition: Goal: Adequate nutrition will be maintained Outcome: Progressing   Problem: Coping: Goal: Level of anxiety will decrease Outcome: Progressing   Problem: Elimination: Goal: Will not experience complications related to bowel motility Outcome: Progressing   Problem: Pain Managment: Goal: General experience of comfort will improve Outcome: Progressing

## 2022-08-07 LAB — GLUCOSE, CAPILLARY
Glucose-Capillary: 106 mg/dL — ABNORMAL HIGH (ref 70–99)
Glucose-Capillary: 138 mg/dL — ABNORMAL HIGH (ref 70–99)
Glucose-Capillary: 159 mg/dL — ABNORMAL HIGH (ref 70–99)
Glucose-Capillary: 153 mg/dL — ABNORMAL HIGH (ref 70–99)

## 2022-08-07 LAB — CULTURE, BLOOD (ROUTINE X 2)
Culture: NO GROWTH
Special Requests: ADEQUATE

## 2022-08-07 LAB — CBC
HCT: 31.3 % — ABNORMAL LOW (ref 39.0–52.0)
Hemoglobin: 10.4 g/dL — ABNORMAL LOW (ref 13.0–17.0)
MCH: 29.4 pg (ref 26.0–34.0)
MCHC: 33.2 g/dL (ref 30.0–36.0)
MCV: 88.4 fL (ref 80.0–100.0)
Platelets: 238 10*3/uL (ref 150–400)
RBC: 3.54 MIL/uL — ABNORMAL LOW (ref 4.22–5.81)
RDW: 15.3 % (ref 11.5–15.5)
WBC: 9.1 10*3/uL (ref 4.0–10.5)
nRBC: 0 % (ref 0.0–0.2)

## 2022-08-07 MED ORDER — TAMSULOSIN HCL 0.4 MG PO CAPS: 0.4000 mg | ORAL_CAPSULE | Freq: Every day | ORAL | Status: DC

## 2022-08-07 MED ORDER — TAMSULOSIN HCL 0.4 MG PO CAPS
0.4000 mg | ORAL_CAPSULE | Freq: Every day | ORAL | Status: DC
  Administered 2022-08-07 – 2022-08-16 (×10): 0.4 mg via ORAL
  Filled 2022-08-07 (×10): qty 1

## 2022-08-07 NOTE — Assessment & Plan Note (Addendum)
Reassuringly has not required foley replacement.  - Flomax daily  - Bladder scans q8h - Continue finasteride - Outpatient urology follow-up

## 2022-08-07 NOTE — Progress Notes (Signed)
Daily Progress Note Intern Pager: [phone]  Patient name: Peter Yang Medical record number: [id number] Date of birth: 1956/10/18 Age: 66 y.o. Gender: male  Primary Care Provider: Clovia Cuff, MD Consultants: None Code Status: DNR  Pt Overview and Major Events to Date:  10/13-admitted  Assessment and Plan:  Peter Yang is a 66 y.o. male presenting with altered mental status, resolved now and medically clear for SNF.     PMHx includes BPH, CAD-NSTEMI in September, T2DM, HTN, HLD, OSA on CPAP, PAF on Eliquis, Thoracic aortic aneurysm, TIA in 2012, CHF-EF 40% with consistent bilateral pleural effusion, recent admission with hematuria/prostatitis/hx of prostate cancer where he was discharged on foley catheter 07/23/2022-07/29/2022 (was given ertapenem for ESBL E coli and amoxicillin for Proteus intermediate to imipenem-discharged on Bactrim double strength BID for 6 weeks)    * Acute metabolic encephalopathy resolved. Likely multifactorial from hypoglycemia and underlying urinary tract infection. Passed SLP eval. A&Ox3 -CBGs q4h    Urinary retention Patient discharged with Foley catheter during recent admission and instructed to follow-up with urology outpatient for voiding trial.  Patient reports some discomfort and is requesting to have it removed earlier. -DC Foley catheter -Bladder scans every 4 hours to ensure no continued urinary retention -Follow-up outpatient with urology  UTI (urinary tract infection) Hx ESBL UTI. WBC normalized. Urine and blood cx NGTD but will continue to treat given significant history. COVID-positive. - TMP-SMX (10/14-11/14) per note from recent admission in [facility] -Patient should follow-up with ID and urology outpatient  Hypoglycemia associated with diabetes ([place]) Initially p/w hypoglycemia, thought to be a cause of his AMS. Hypoglycemia is now resolved. -Restarted SSI -CBG q4h -Hypoglycemia protocol, D50 ordered as  needed  Elevated troponin Hx of CAD and NSTEMI s/p 2 stents. Trop 672>094>709>628. EKG with some ST depression in V6, similar to previous. Hx of NSTEMI 1 month ago and April. Denies chest pain currently. - ASA '81mg'$  qd - plavix 75 mg daily (recommended DAPT for a year on last hospitalization)    Hematuria Noted to have macroscopic hematuria. Denies dysuria. On bactrim as above -outpatient f/u with urology -continue to f/u CBC -Cont eliquis and DAPT for now, unless hgb worsens  Paroxysmal atrial fibrillation (HCC) Rate controlled currently. -Eliquis 5 mg BID -Consider IV rate control if RVR -Keep K>4, Mag>2  Heart failure with mid-range ejection fraction ([place]) Last echo 06/15/2022 with EF of 40-45% with LV with regional wall motion abnormality, LA severly dilated, mod MVR.  Does not appear to be in acute exacerbation currently. BP still low and was given 1 L LR with improvement in MAPS. -Strict I/O's -Daily weights  Pressure ulcer of sacral region, stage 2 ([place]) Present on admission.  Wound care per WOC recommendations.  Lab test positive for detection of COVID-19 virus Positive COVID test 10/13.  Does not appear to be overtly symptomatic. -Continue precautions  PVD (peripheral vascular disease) ([place]) Cool right foot, abnormal ABI.  Patient denies pain - consider vascular surgery consult       FEN/GI: Regular diet PPx: Eliquis Dispo:SNF  pending placement . Medically stable  Subjective:  No concerns today, eating and drinking okay.  Denies abdominal pain, CP/SOB.  Objective: Temp:  [97.6 F (36.4 C)-97.9 F (36.6 C)] 97.9 F (36.6 C) (10/18 0800) Pulse Rate:  [75-89] 78 (10/18 0800) Resp:  [11-16] 16 (10/18 0800) BP: (92-100)/(60-67) 92/60 (10/18 0800) SpO2:  [93 %-95 %] 93 % (10/18 0300) Weight:  [101.2 kg] 101.2 kg (10/18 0500) Physical  Exam: General: NAD, alert, responsive Cardiovascular: RRR no MRG Respiratory: CTAB on RA Abdomen: Soft,  NT/ND Extremities: RLE normal temperature with 2+ DP pulse  Laboratory: Most recent CBC Lab Results  Component Value Date   WBC 9.1 08/07/2022   HGB 10.4 (L) 08/07/2022   HCT 31.3 (L) 08/07/2022   MCV 88.4 08/07/2022   PLT 238 08/07/2022   Most recent BMP    Latest Ref Rng & Units 08/06/2022    3:07 AM  BMP  Glucose 70 - 99 mg/dL 153   BUN 8 - 23 mg/dL 17   Creatinine 0.61 - 1.24 mg/dL 1.26   Sodium 135 - 145 mmol/L 135   Potassium 3.5 - 5.1 mmol/L 4.0   Chloride 98 - 111 mmol/L 108   CO2 22 - 32 mmol/L 19   Calcium 8.9 - 10.3 mg/dL 8.0      August Albino, MD 08/07/2022, 12:09 PM  PGY-1, [place] Intern pager: [phone], text pages welcome Secure chat group [place]

## 2022-08-07 NOTE — Progress Notes (Deleted)
Late entry:  Updated family on patient's status and changes today. We will keep them updated, getting close to discharge if doing well still. All questions answered.

## 2022-08-08 DIAGNOSIS — R4182 Altered mental status, unspecified: Secondary | ICD-10-CM | POA: Diagnosis not present

## 2022-08-08 LAB — CBC
HCT: 32 % — ABNORMAL LOW (ref 39.0–52.0)
Hemoglobin: 10.6 g/dL — ABNORMAL LOW (ref 13.0–17.0)
MCH: 29.4 pg (ref 26.0–34.0)
MCHC: 33.1 g/dL (ref 30.0–36.0)
MCV: 88.9 fL (ref 80.0–100.0)
Platelets: 238 10*3/uL (ref 150–400)
RBC: 3.6 MIL/uL — ABNORMAL LOW (ref 4.22–5.81)
RDW: 15.1 % (ref 11.5–15.5)
WBC: 8.1 10*3/uL (ref 4.0–10.5)
nRBC: 0 % (ref 0.0–0.2)

## 2022-08-08 LAB — GLUCOSE, CAPILLARY
Glucose-Capillary: 128 mg/dL — ABNORMAL HIGH (ref 70–99)
Glucose-Capillary: 194 mg/dL — ABNORMAL HIGH (ref 70–99)
Glucose-Capillary: 175 mg/dL — ABNORMAL HIGH (ref 70–99)

## 2022-08-08 MED ORDER — ATORVASTATIN CALCIUM 80 MG PO TABS
80.0000 mg | ORAL_TABLET | Freq: Every day | ORAL | Status: DC
  Administered 2022-08-08 – 2022-08-16 (×9): 80 mg via ORAL
  Filled 2022-08-08 (×9): qty 1

## 2022-08-08 MED ORDER — FINASTERIDE 5 MG PO TABS
5.0000 mg | ORAL_TABLET | Freq: Every day | ORAL | Status: DC
  Administered 2022-08-08 – 2022-08-16 (×9): 5 mg via ORAL
  Filled 2022-08-08 (×9): qty 1

## 2022-08-08 NOTE — Progress Notes (Signed)
Daily Progress Note Intern Pager: [phone]  Patient name: Peter Yang Medical record number: [id number] Date of birth: [birth year] Age: 66 y.o. Gender: male  Primary Care Provider: Clovia Cuff, MD Consultants: none Code Status: DNR  Pt Overview and Major Events to Date:  10/13-admitted  Assessment and Plan:  Peter Yang. AMS now resolved, Yang removed, and patient is medically clear for SNF.   * Acute metabolic encephalopathy resolved. Likely multifactorial from hypoglycemia and underlying urinary tract infection. Passed SLP eval. A&Ox3 -CBGs q4h    Urinary retention DC'd Yang, bladder scan yesterday with 60m PVR. -Flomax 0.'4mg'$  -Bladder scans every 4 hours to ensure no continued urinary retention -Home finasteride -Follow-up outpatient with urology  UTI (urinary tract infection) Hx ESBL UTI. WBC normalized. Urine and blood cx NGTD but will continue to treat given significant history. COVID-positive. - TMP-SMX (10/14-11/14) [facility]Patient should follow-up with ID and urology outpatient  Hypoglycemia associated with diabetes ([place] Initially p/w hypoglycemia, thought to be a cause of his AMS. Hypoglycemia is now resolved. -Restarted SSI -CBG q4h -Hypoglycemia protocol, D50 ordered as needed  Elevated troponin Hx of CAD and NSTEMI s/p 2 stents. Trop 4656>812>751>700 EKG with some ST depression in V6, similar to previous. Hx of NSTEMI 1 month ago and April. Denies chest pain currently. - ASA '81mg'$  qd - plavix 75 mg daily (recommended DAPT for a year on last hospitalization)    Hematuria Noted to have macroscopic hematuria. Denies dysuria. On bactrim as above -outpatient f/u with urology -continue to f/u CBC -Cont eliquis and DAPT for now, unless hgb worsens  Paroxysmal atrial fibrillation (HCC) Rate controlled  currently. -Eliquis 5 mg BID -Consider IV rate control if RVR -Keep K>4, Mag>2  Heart failure with [place]-range ejection fraction [place] Last echo 06/15/2022 with EF of 40-45% with LV with regional wall motion abnormality, LA severly dilated, mod MVR.  Does not appear to be in acute exacerbation currently. BP still low and was given 1 L LR with improvement in MAPS. -Strict I/O's -Daily weights  Pressure ulcer of sacral region, stage 2 ([place] Present on admission.  Wound care per WOC recommendations.  Lab test positive for detection of COVID-19 virus Positive COVID test 10/13.  Does not appear to be overtly symptomatic. -Continue precautions  PVD (peripheral vascular disease) ([place] Cool right foot, abnormal ABI.  Patient denies pain - consider vascular surgery consult   HLD -Restarted home lipitor   FEN/GI: Reg diet PPx: Eliquis Dispo:SNF  pending placement . Medically stable.   Subjective:  No acute concerns this morning. States he feels better after having the Yang removed. Denies CP/SOB, abd pain, dysuria.  Objective: Temp:  [97.8 F (36.6 C)-98.2 F (36.8 C)] 98.1 F (36.7 C) (10/19 1151) Pulse Rate:  [78-83] 83 (10/19 0452) Resp:  [12-26] 16 (10/19 1151) BP: (91-110)/(62-69) 101/62 (10/19 1151) SpO2:  [90 %-99 %] 98 % (10/19 1151) Weight:  [101.2 kg] 101.2 kg (10/19 0500) Physical Exam: General: NAD, alert, responsive Cardiovascular: RRR no MRG Respiratory: CTAB normal WOB on RA Abdomen: soft NT/ND  Laboratory: Most recent CBC Lab Results  Component Value Date   WBC 8.1 08/08/2022   HGB 10.6 (L) 08/08/2022   HCT 32.0 (L) 08/08/2022   MCV 88.9 08/08/2022   PLT 238 08/08/2022   Most recent BMP    Latest Ref Rng & Units  08/06/2022    3:07 AM  BMP  Glucose 70 - 99 mg/dL 153   BUN 8 - 23 mg/dL 17   Creatinine 0.61 - 1.24 mg/dL 1.26   Sodium 135 - 145 mmol/L 135   Potassium 3.5 - 5.1 mmol/L 4.0   Chloride 98 - 111 mmol/L 108   CO2 22 - 32 mmol/L 19    Calcium 8.9 - 10.3 mg/dL 8.0      Peter Albino, MD 08/08/2022, 1:18 PM  PGY-1, [place] Intern pager: [phone], text pages welcome Secure chat group [place]

## 2022-08-08 NOTE — Plan of Care (Signed)
  Problem: Health Behavior/Discharge Planning: Goal: Ability to manage health-related needs will improve Outcome: Progressing   Problem: Activity: Goal: Risk for activity intolerance will decrease Outcome: Progressing   Problem: Nutrition: Goal: Adequate nutrition will be maintained Outcome: Progressing   Problem: Coping: Goal: Level of anxiety will decrease Outcome: Progressing   Problem: Coping: Goal: Level of anxiety will decrease Outcome: Progressing   Problem: Elimination: Goal: Will not experience complications related to bowel motility Outcome: Progressing

## 2022-08-08 NOTE — Progress Notes (Signed)
Physical Therapy Treatment Patient Details Name: Peter Yang MRN: [id number] DOB: 03-Jan-1956 Today's Date: 08/08/2022   History of Present Illness 66 year old male presenting with acute metabolic encephalopathy in the setting of hypoglycemia and sepsis from suspected urinary source.  Also with positive COVID test.  PMH BPH; DM; HTN; PVD s/p L BKA; CAD s/p stent; HLD, hx of TIA, OSA who was recently discharged on 8/29 from [facility] for a NSTEMI and hypoglycemia.    PT Comments    Pt received in supine, agreeable to therapy session and with good participation and fair tolerance for seated balance activity and transfer training. Pt needing totalA +2 for attempts to stand and unable to lift hips >4" from edge of elevated bed using Stedy and needing mod to maxA for lateral seated scooting with small propulsion scoots along EOB. Pt currently requiring mechanical lift assist for OOB transfers and will need drop arm chair brought to room for future PT sessions to work on lateral scoot transfers, RN notified. No prosthetic in room, pt encouraged to ask friends/neighbors to bring it in if possible. Pt continues to benefit from PT services to progress toward functional mobility goals.  Frequency updated below to reflect dept policy for his specific diagnosis/disposition plan, discussed with supervising PT Peter M.  Recommendations for follow up therapy are one component of a multi-disciplinary discharge planning process, led by the attending physician.  Recommendations may be updated based on patient status, additional functional criteria and insurance authorization.  Follow Up Recommendations  Skilled nursing-short term rehab (<3 hours/day) Can patient physically be transported by private vehicle: No   Assistance Recommended at Discharge Frequent or constant Supervision/Assistance  Patient can return home with the following Two people to help with walking and/or transfers;Help with stairs or ramp for  entrance;A little help with bathing/dressing/bathroom;Assistance with cooking/housework;Assist for transportation   Equipment Recommendations  Other (comment) (consider hospital bed)    Recommendations for Other Services       Precautions / Restrictions Precautions Precautions: Fall Precaution Comments: Airborne/Contact Covid precs; L BKA no prosthesis with him Required Braces or Orthoses:  (pt reports "i don't have anyone I can call" to bring his prosthetic from home) Restrictions Weight Bearing Restrictions: No     Mobility  Bed Mobility Overal bed mobility: Needs Assistance Bed Mobility: Supine to Sit, Sit to Supine     Supine to sit: Mod assist, +2 for physical assistance Sit to supine: Min assist   General bed mobility comments: Pt needed assist with bringing trunk up to sitting and then scooting to get to EOB after visual demo for easier technique. Slow to initiate.    Transfers Overall transfer level: Needs assistance Equipment used: Ambulation equipment used Peter Yang) Transfers: Sit to/from Stand Sit to Stand: +2 physical assistance, Total assist (partial stand, barely clearing hips from EOB)           General transfer comment: Pt only able to stand partially ~40% with the Stedy x 2 trials, bed height elevated multiple inches prior to attempts as pt is 30f 1". Pillow anterior to knee in [place]for comfort.       Balance Overall balance assessment: Needs assistance Sitting-balance support: No upper extremity supported, Feet supported Sitting balance-Leahy Scale: Fair Sitting balance - Comments: pt performed lateral and cross-body reaching seated EOB to each direction x3 reps and lateral leans with elbow taps x3 reps ea side   Standing balance support: Reliant on assistive device for balance Standing balance-Leahy Scale: Zero Standing balance comment: Pt  unable to stand up with use of the Stedy and bed elevated                            Cognition  Arousal/Alertness: Awake/alert Behavior During Therapy: Flat affect Overall Cognitive Status: No family/caregiver present to determine baseline cognitive functioning Area of Impairment: Orientation                 Orientation Level: Time     Following Commands: Follows one step commands with increased time   Awareness: Intellectual Problem Solving: Slow processing, Decreased initiation, Requires verbal cues, Requires tactile cues General Comments: Pt demos slower initiation and processing of instructions, especially with attempts to transfer or perform seated scooting. Pt reports no dizziness or other concerns which are delaying him.        Exercises Other Exercises Other Exercises: supine BLE AROM: knee flex/ext, RLE ankle pumps, SLR x 5-10 reps ea Other Exercises: seated lateral leans with elbow taps x3 reps each direction and anterior/lateral reaching outside BOS x 3 reps ea direction    General Comments General comments (skin integrity, edema, etc.): VSS on RA      Pertinent Vitals/Pain Pain Assessment Pain Assessment: Faces Faces Pain Scale: Hurts even more Pain Location: R knee with weight bearing/standing trials Pain Descriptors / Indicators: Discomfort, Guarding Pain Intervention(s): Limited activity within patient's tolerance, Monitored during session, Repositioned, Other (comment) (pillow placed anterior to knee in Stedy with standing trials)     PT Goals (current goals can now be found in the care plan section) Acute Rehab PT Goals Patient Stated Goal: Did not state PT Goal Formulation: Patient unable to participate in goal setting Time For Goal Achievement: 08/18/22 Progress towards PT goals: Progressing toward goals    Frequency    Min 2X/week      PT Plan Frequency needs to be updated       AM-PAC PT "6 Clicks" Mobility   Outcome Measure  Help needed turning from your back to your side while in a flat bed without using bedrails?: A  Little Help needed moving from lying on your back to sitting on the side of a flat bed without using bedrails?: A Lot Help needed moving to and from a bed to a chair (including a wheelchair)?: Total Help needed standing up from a chair using your arms (e.g., wheelchair or bedside chair)?: Total Help needed to walk in hospital room?: Total Help needed climbing 3-5 steps with a railing? : Total 6 Click Score: 9    End of Session Equipment Utilized During Treatment: Gait belt Activity Tolerance: Patient limited by fatigue;Patient limited by pain (R knee pain) Patient left: in bed;with call bell/phone within reach;with bed alarm set;Other (comment) (bed in chair posture, pt encouraged to do this multiple times a day) Nurse Communication: Other (comment);Mobility status;Need for lift equipment (pt currently would need +2 for lateral scoot transfer OOB to chair or maxi-move lift; needs drop arm recliner in his room) PT Visit Diagnosis: Other abnormalities of gait and mobility (R26.89);Muscle weakness (generalized) (M62.81)     Time: 4401-0272 PT Time Calculation (min) (ACUTE ONLY): 32 min  Charges:  $Therapeutic Exercise: 8-22 mins $Therapeutic Activity: 8-22 mins                     Jaelee Laughter P., PTA Acute Rehabilitation Services Secure Chat Preferred 9a-5:30pm Office: [place] 08/08/2022, 3:31 PM

## 2022-08-09 DIAGNOSIS — N179 Acute kidney failure, unspecified: Secondary | ICD-10-CM

## 2022-08-09 LAB — GLUCOSE, CAPILLARY
Glucose-Capillary: 221 mg/dL — ABNORMAL HIGH (ref 70–99)
Glucose-Capillary: 135 mg/dL — ABNORMAL HIGH (ref 70–99)
Glucose-Capillary: 219 mg/dL — ABNORMAL HIGH (ref 70–99)
Glucose-Capillary: 199 mg/dL — ABNORMAL HIGH (ref 70–99)
Glucose-Capillary: 160 mg/dL — ABNORMAL HIGH (ref 70–99)

## 2022-08-09 LAB — BASIC METABOLIC PANEL
Anion gap: 10 (ref 5–15)
BUN: 22 mg/dL (ref 8–23)
CO2: 18 mmol/L — ABNORMAL LOW (ref 22–32)
Calcium: 8.3 mg/dL — ABNORMAL LOW (ref 8.9–10.3)
Chloride: 105 mmol/L (ref 98–111)
Creatinine, Ser: 1.51 mg/dL — ABNORMAL HIGH (ref 0.61–1.24)
GFR, Estimated: 51 mL/min — ABNORMAL LOW (ref >60)
Glucose, Bld: 145 mg/dL — ABNORMAL HIGH (ref 70–99)
Potassium: 4.6 mmol/L (ref 3.5–5.1)
Sodium: 133 mmol/L — ABNORMAL LOW (ref 135–145)

## 2022-08-09 LAB — CBC
HCT: 33.8 % — ABNORMAL LOW (ref 39.0–52.0)
Hemoglobin: 10.9 g/dL — ABNORMAL LOW (ref 13.0–17.0)
MCH: 29.1 pg (ref 26.0–34.0)
MCHC: 32.2 g/dL (ref 30.0–36.0)
MCV: 90.1 fL (ref 80.0–100.0)
Platelets: 252 10*3/uL (ref 150–400)
RBC: 3.75 MIL/uL — ABNORMAL LOW (ref 4.22–5.81)
RDW: 15.3 % (ref 11.5–15.5)
WBC: 7.3 10*3/uL (ref 4.0–10.5)
nRBC: 0 % (ref 0.0–0.2)

## 2022-08-09 MED ORDER — SULFAMETHOXAZOLE-TRIMETHOPRIM 800-160 MG PO TABS
1.0000 | ORAL_TABLET | Freq: Two times a day (BID) | ORAL | Status: DC
  Administered 2022-08-09 – 2022-08-16 (×15): 1 via ORAL
  Filled 2022-08-09 (×15): qty 1

## 2022-08-09 NOTE — Plan of Care (Signed)

## 2022-08-09 NOTE — Assessment & Plan Note (Signed)
Resolved, Cr improved. Obstruction vs poor oral intake. Reassuringly is voiding appropriately. - Bladder scans q8h - Encourage oral hydration - Trend BMP

## 2022-08-09 NOTE — Plan of Care (Signed)
  Problem: Education: Goal: Knowledge of General Education information will improve Description Including pain rating scale, medication(s)/side effects and non-pharmacologic comfort measures Outcome: Progressing   

## 2022-08-09 NOTE — Progress Notes (Addendum)
Daily Progress Note Intern Pager: [phone]  Patient name: Peter Yang Medical record number: [id number] Date of birth: [birth year] Age: 65 y.o. Gender: male  Primary Care Provider: Clovia Cuff, MD Consultants: None Code Status: DNR  Pt Overview and Major Events to Date:  10/13-admitted  Assessment and Plan:  Peter Yang is a 66 y.o. male p/w AMS in the setting of hypoglycemia and suspected UTI with indewelling Foley. AMS now resolved, foley removed, and patient is medically clear for SNF.   Urinary retention DC'd foley on 10/18  -Flomax 0.'4mg'$  -Bladder scans every 4 hours to ensure no continued urinary retention -Home finasteride -Follow-up outpatient with urology  AKI (acute kidney injury) ([place]) Cr 1.51, baseline appears to be around 1.1. Likely in the setting of obstruction vs poor PO hydration -Bladder scans and management of urinary retention as above -encourage PO hydration  UTI (urinary tract infection) Hx ESBL UTI. WBC normalized. Urine and blood cx NGTD but will continue to treat given significant history. COVID-positive. - TMP-SMX (10/14-11/14) per note from recent admission in [facility] -Patient should follow-up with ID and urology outpatient  Hypoglycemia associated with diabetes ([place]) Initially p/w hypoglycemia, thought to be a cause of his AMS. Hypoglycemia is now resolved. -Restarted SSI -CBG q4h -Hypoglycemia protocol, D50 ordered as needed  CAD  Hx of CAD and NSTEMI s/p 2 stents. - ASA '81mg'$  qd - plavix 75 mg daily (recommended DAPT for a year on last hospitalization)  Acute metabolic encephalopathy resolved. Likely multifactorial from hypoglycemia.   Hematuria Noted to have macroscopic hematuria. Denies dysuria. On bactrim as above -outpatient f/u with urology -continue to f/u CBC -Cont eliquis and DAPT for now, unless hgb worsens  Paroxysmal atrial fibrillation (HCC) Rate controlled currently. -Eliquis 5 mg BID -Consider IV rate  control if RVR -Keep K>4, Mag>2  Heart failure with mid-range ejection fraction ([place]) Last echo 06/15/2022 with EF of 40-45% with LV with regional wall motion abnormality, LA severly dilated, mod MVR.  Does not appear to be in acute exacerbation currently. -Strict I/O's -Daily weights  Pressure ulcer of sacral region, stage 2 ([place]) Present on admission.  Wound care per WOC recommendations.  Lab test positive for detection of COVID-19 virus Positive COVID test 10/13.  Does not appear to be overtly symptomatic. -Continue precautions, end date 10/23   PVD (peripheral vascular disease) ([place]) Cool right foot on admission, abnormal ABI.  Patient denies pain. R foot normal temperature currently with 2+ DP pulse. - consider vascular surgery referral outpatient  FEN/GI: Reg diet PPx: Eliquis Dispo:SNF  pending placement . Medically stable.   Subjective:  Seen this morning, no concerns, denies chest/abd pain, SOB. Encouraged more oral hydration today.  Objective: Temp:  [98 F (36.7 C)-98.6 F (37 C)] 98 F (36.7 C) (10/20 0740) Pulse Rate:  [75-79] 79 (10/20 0740) Resp:  [16-18] 16 (10/20 0740) BP: (91-101)/(56-70) 91/56 (10/20 0740) SpO2:  [97 %-99 %] 99 % (10/20 0900) Weight:  [101 kg] 101 kg (10/20 0414) Physical Exam: General: NAD, comfortable and responsive. Condom cath on, red urine in bag Cardiovascular: RRR no MRG Respiratory: CTAB on RA Abdomen: soft NT/ND  Laboratory: Most recent CBC Lab Results  Component Value Date   WBC 7.3 08/09/2022   HGB 10.9 (L) 08/09/2022   HCT 33.8 (L) 08/09/2022   MCV 90.1 08/09/2022   PLT 252 08/09/2022   Most recent BMP    Latest Ref Rng & Units 08/09/2022    2:45 AM  BMP  Glucose 70 - 99 mg/dL 145   BUN 8 - 23 mg/dL 22   Creatinine 0.61 - 1.24 mg/dL 1.51   Sodium 135 - 145 mmol/L 133   Potassium 3.5 - 5.1 mmol/L 4.6   Chloride 98 - 111 mmol/L 105   CO2 22 - 32 mmol/L 18   Calcium 8.9 - 10.3 mg/dL 8.3      Peter Albino,  MD 08/09/2022, 11:04 AM  PGY-1, [place] Intern pager: [phone], text pages welcome Secure chat group [place]

## 2022-08-09 NOTE — TOC Progression Note (Signed)
Transition of Care [facility]) - Progression Note    Patient Details  Name: Peter Yang MRN: [id number] Date of Birth: 1956/04/15  Transition of Care [facility]) CM/SW [place], LCSW Phone Number: 08/09/2022, 9:19 AM  Clinical Narrative:    Insurance approval still pending for [facility].    Expected Discharge Plan: [place] Barriers to Discharge: Continued Medical Work up, Ship broker, SNF Pending bed offer, SNF Covid  Expected Discharge Plan and Services Expected Discharge Plan: [place] In-house Referral: Clinical Social Work   Post Acute Care Choice: [place] Living arrangements for the past 2 months: [place]                                       Social Determinants of Health (SDOH) Interventions    Readmission Risk Interventions     No data to display

## 2022-08-10 DIAGNOSIS — N179 Acute kidney failure, unspecified: Secondary | ICD-10-CM | POA: Diagnosis not present

## 2022-08-10 LAB — BASIC METABOLIC PANEL
Anion gap: 8 (ref 5–15)
BUN: 20 mg/dL (ref 8–23)
CO2: 19 mmol/L — ABNORMAL LOW (ref 22–32)
Calcium: 8.2 mg/dL — ABNORMAL LOW (ref 8.9–10.3)
Chloride: 104 mmol/L (ref 98–111)
Creatinine, Ser: 1.35 mg/dL — ABNORMAL HIGH (ref 0.61–1.24)
GFR, Estimated: 58 mL/min — ABNORMAL LOW (ref >60)
Glucose, Bld: 233 mg/dL — ABNORMAL HIGH (ref 70–99)
Potassium: 4.1 mmol/L (ref 3.5–5.1)
Sodium: 131 mmol/L — ABNORMAL LOW (ref 135–145)

## 2022-08-10 LAB — GLUCOSE, CAPILLARY
Glucose-Capillary: 166 mg/dL — ABNORMAL HIGH (ref 70–99)
Glucose-Capillary: 167 mg/dL — ABNORMAL HIGH (ref 70–99)

## 2022-08-10 LAB — CBC
HCT: 28.9 % — ABNORMAL LOW (ref 39.0–52.0)
Hemoglobin: 9.7 g/dL — ABNORMAL LOW (ref 13.0–17.0)
MCH: 29.2 pg (ref 26.0–34.0)
MCHC: 33.6 g/dL (ref 30.0–36.0)
MCV: 87 fL (ref 80.0–100.0)
Platelets: 242 10*3/uL (ref 150–400)
RBC: 3.32 MIL/uL — ABNORMAL LOW (ref 4.22–5.81)
RDW: 15.2 % (ref 11.5–15.5)
WBC: 7.4 10*3/uL (ref 4.0–10.5)
nRBC: 0 % (ref 0.0–0.2)

## 2022-08-10 NOTE — Plan of Care (Signed)

## 2022-08-10 NOTE — Progress Notes (Signed)
     Daily Progress Note Intern Pager: [phone]  Patient name: Peter Yang Medical record number: [id number] Date of birth: 1956-08-04 Age: 66 y.o. Gender: male  Primary Care Provider: Clovia Cuff, MD Consultants: None Code Status: DNR  Pt Overview and Major Events to Date:  10/13 - Admitted  Assessment and Plan: Peter Yang is a 66 y.o. male p/w AMS in the setting of hypoglycemia and suspected UTI with indewelling Foley. AMS now resolved, foley removed, and patient is medically clear for SNF.   * Acute metabolic encephalopathy Now resolved. Awaiting SNF placement.   Urinary retention Reassuringly has not required foley replacement.  - Flomax daily  - Bladder scans q4h - Continue finasteride - Outpatient urology follow-up  AKI (acute kidney injury) ([place]) Awaiting BMP to evaluate creatinine. Obstruction vs poor oral intake. Reassuringly is voiding appropriately. - Bladder scans q4h  - Encourage oral hydration - Trend BMP  UTI (urinary tract infection) Stable - Continue Bactrim q12h (end date 11/14 per previous notes) - Urology follow-up outpatient  Hypoglycemia associated with diabetes ([name]) Restarted on SSI, has received 8 Units Novolog in the last 24h. - CBGs QAC and QHS - Monitor for hypoglycemia  Hematuria Urine continues to have macroscopic blood without dysuria. Hemoglobin 10.9>9.7, stable.  -continue to f/u CBC -Cont eliquis and DAPT for now, unless hgb worsens    FEN/GI: Regular PPx: Eliquis Dispo:SNF  pending placement  .  Subjective:  Patient has no complaints this morning.  He feels like the color of his urine may be getting lighter and is hopeful for improvement. He has no other concerns and is feeling well overall.  Objective: Temp:  [97.8 F (36.6 C)-98.9 F (37.2 C)] 98.9 F (37.2 C) (10/21 0425) Pulse Rate:  [77-87] 77 (10/21 0425) Resp:  [14-18] 16 (10/21 0425) BP: (89-97)/(56-63) 90/59 (10/21 0425) SpO2:  [98 %-99 %] 99 %  (10/21 0425) Physical Exam: General: NAD, sitting up in bed Cardiovascular: RRR, no m/r/g appreciated Respiratory: CTAB, normal WOB Abdomen: soft, non-tender, non-distended Extremities: Left BKA, moves extremities appropriately   Laboratory: Most recent CBC Lab Results  Component Value Date   WBC 7.4 08/10/2022   HGB 9.7 (L) 08/10/2022   HCT 28.9 (L) 08/10/2022   MCV 87.0 08/10/2022   PLT 242 08/10/2022   Most recent BMP    Latest Ref Rng & Units 08/09/2022    2:45 AM  BMP  Glucose 70 - 99 mg/dL 145   BUN 8 - 23 mg/dL 22   Creatinine 0.61 - 1.24 mg/dL 1.51   Sodium 135 - 145 mmol/L 133   Potassium 3.5 - 5.1 mmol/L 4.6   Chloride 98 - 111 mmol/L 105   CO2 22 - 32 mmol/L 18   Calcium 8.9 - 10.3 mg/dL 8.3      [name], DO 08/10/2022, 6:26 AM  PGY-3, [place] Intern pager: [phone], text pages welcome Secure chat group [place]

## 2022-08-10 NOTE — Assessment & Plan Note (Addendum)
Cautiously adding back diabetes regimen due to initial concern for hypoglycemia.  Blood sugar steadily increased with diet.  Stable. -Change to diabetic diet, continue to monitor sugars, consider starting long-acting insulin (6 units Semglee) if still elevated - continue SSI - started metformin - CBGs QAC and QHS - Monitor for hypoglycemia

## 2022-08-11 DIAGNOSIS — N3001 Acute cystitis with hematuria: Secondary | ICD-10-CM | POA: Diagnosis not present

## 2022-08-11 LAB — BASIC METABOLIC PANEL
Anion gap: 4 — ABNORMAL LOW (ref 5–15)
BUN: 19 mg/dL (ref 8–23)
CO2: 23 mmol/L (ref 22–32)
Calcium: 8.6 mg/dL — ABNORMAL LOW (ref 8.9–10.3)
Chloride: 105 mmol/L (ref 98–111)
Creatinine, Ser: 1.23 mg/dL (ref 0.61–1.24)
GFR, Estimated: >60 mL/min (ref >60)
Glucose, Bld: 169 mg/dL — ABNORMAL HIGH (ref 70–99)
Potassium: 4.5 mmol/L (ref 3.5–5.1)
Sodium: 132 mmol/L — ABNORMAL LOW (ref 135–145)

## 2022-08-11 LAB — CBC
HCT: 30.6 % — ABNORMAL LOW (ref 39.0–52.0)
Hemoglobin: 10.3 g/dL — ABNORMAL LOW (ref 13.0–17.0)
MCH: 29.5 pg (ref 26.0–34.0)
MCHC: 33.7 g/dL (ref 30.0–36.0)
MCV: 87.7 fL (ref 80.0–100.0)
Platelets: 230 10*3/uL (ref 150–400)
RBC: 3.49 MIL/uL — ABNORMAL LOW (ref 4.22–5.81)
RDW: 15.3 % (ref 11.5–15.5)
WBC: 7.1 10*3/uL (ref 4.0–10.5)
nRBC: 0 % (ref 0.0–0.2)

## 2022-08-11 LAB — GLUCOSE, CAPILLARY
Glucose-Capillary: 169 mg/dL — ABNORMAL HIGH (ref 70–99)
Glucose-Capillary: 188 mg/dL — ABNORMAL HIGH (ref 70–99)
Glucose-Capillary: 163 mg/dL — ABNORMAL HIGH (ref 70–99)
Glucose-Capillary: 208 mg/dL — ABNORMAL HIGH (ref 70–99)

## 2022-08-11 NOTE — Progress Notes (Addendum)
     Daily Progress Note Intern Pager: [phone]  Patient name: Peter Yang Medical record number: [id number] Date of birth: 06/06/1956 Age: 66 y.o. Gender: male  Primary Care Provider: Clovia Cuff, MD Consultants: None Code Status: DNR  Pt Overview and Major Events to Date:  10/13-admitted  Assessment and Plan:  Peter Yang is a 66 y.o. male p/w AMS in the setting of hypoglycemia and suspected UTI with indewelling Foley. AMS now resolved, foley removed, and patient is medically clear for SNF.  * Acute metabolic encephalopathy Now resolved. Awaiting SNF placement.   Urinary retention Reassuringly has not required foley replacement.  - Flomax daily  - Bladder scans q8h - Continue finasteride - Outpatient urology follow-up  UTI (urinary tract infection) Stable - Continue Bactrim q12h (end date 11/14 per previous notes) - Urology follow-up outpatient  Hypoglycemia associated with diabetes ([place]) Stable.Restarted on SSI, has received 6 Units Novolog in the last 24h. - CBGs QAC and QHS - Monitor for hypoglycemia  Hematuria Urine continues to have macroscopic blood without dysuria. Hemoglobin stable.  -continue to f/u CBC -Cont eliquis and DAPT for now, unless hgb worsens       FEN/GI: Regular PPx: Eliquis Dispo:SNF  pending placement, medically stable   Subjective:  No concerns this morning, denies pain/SOB.  Eating and drinking well.  Objective: Temp:  [97.9 F (36.6 C)-98.6 F (37 C)] 98 F (36.7 C) (10/22 0741) Pulse Rate:  [70-78] 71 (10/22 0413) Resp:  [16-18] 16 (10/22 0413) BP: (82-101)/(57-65) 82/62 (10/22 0741) SpO2:  [99 %-100 %] 99 % (10/22 0413) Physical Exam: General: NAD, pleasant, resting in bed Cardiovascular: RRR no MRG Respiratory: CTAB normal WOB on RA Abdomen: Soft, NT/ND Extremities: Left BKA, moves extremities appropriately  Laboratory: Most recent CBC Lab Results  Component Value Date   WBC 7.1 08/11/2022   HGB 10.3  (L) 08/11/2022   HCT 30.6 (L) 08/11/2022   MCV 87.7 08/11/2022   PLT 230 08/11/2022   Most recent BMP    Latest Ref Rng & Units 08/11/2022    2:29 AM  BMP  Glucose 70 - 99 mg/dL 169   BUN 8 - 23 mg/dL 19   Creatinine 0.61 - 1.24 mg/dL 1.23   Sodium 135 - 145 mmol/L 132   Potassium 3.5 - 5.1 mmol/L 4.5   Chloride 98 - 111 mmol/L 105   CO2 22 - 32 mmol/L 23   Calcium 8.9 - 10.3 mg/dL 8.6     August Albino, MD 08/11/2022, 10:36 AM  PGY-1, [place] Intern pager: [phone], text pages welcome Secure chat group [place]

## 2022-08-12 DIAGNOSIS — N3001 Acute cystitis with hematuria: Secondary | ICD-10-CM | POA: Diagnosis not present

## 2022-08-12 LAB — BASIC METABOLIC PANEL
Anion gap: 10 (ref 5–15)
Anion gap: 9 (ref 5–15)
BUN: 21 mg/dL (ref 8–23)
BUN: 20 mg/dL (ref 8–23)
CO2: 18 mmol/L — ABNORMAL LOW (ref 22–32)
CO2: 21 mmol/L — ABNORMAL LOW (ref 22–32)
Calcium: 8.4 mg/dL — ABNORMAL LOW (ref 8.9–10.3)
Calcium: 8.5 mg/dL — ABNORMAL LOW (ref 8.9–10.3)
Chloride: 104 mmol/L (ref 98–111)
Chloride: 101 mmol/L (ref 98–111)
Creatinine, Ser: 1.3 mg/dL — ABNORMAL HIGH (ref 0.61–1.24)
Creatinine, Ser: 1.27 mg/dL — ABNORMAL HIGH (ref 0.61–1.24)
GFR, Estimated: >60 mL/min (ref >60)
GFR, Estimated: >60 mL/min (ref >60)
Glucose, Bld: 215 mg/dL — ABNORMAL HIGH (ref 70–99)
Glucose, Bld: 232 mg/dL — ABNORMAL HIGH (ref 70–99)
Potassium: 4.5 mmol/L (ref 3.5–5.1)
Potassium: 4.6 mmol/L (ref 3.5–5.1)
Sodium: 132 mmol/L — ABNORMAL LOW (ref 135–145)
Sodium: 131 mmol/L — ABNORMAL LOW (ref 135–145)

## 2022-08-12 LAB — GLUCOSE, CAPILLARY
Glucose-Capillary: 219 mg/dL — ABNORMAL HIGH (ref 70–99)
Glucose-Capillary: 217 mg/dL — ABNORMAL HIGH (ref 70–99)
Glucose-Capillary: 181 mg/dL — ABNORMAL HIGH (ref 70–99)
Glucose-Capillary: 263 mg/dL — ABNORMAL HIGH (ref 70–99)
Glucose-Capillary: 222 mg/dL — ABNORMAL HIGH (ref 70–99)
Glucose-Capillary: 238 mg/dL — ABNORMAL HIGH (ref 70–99)

## 2022-08-12 NOTE — Progress Notes (Addendum)
     Daily Progress Note Intern Pager: [phone]  Patient name: Peter Yang Medical record number: [id number] Date of birth: 1955/12/15 Age: 66 y.o. Gender: male  Primary Care Provider: Clovia Cuff, MD Consultants: None Code Status: DNR  Pt Overview and Major Events to Date:  10/13: DNR  Assessment and Plan: Jair Lindblad is a 65 year old male who presented with altered mental status in setting of hypoglycemia and suspected UTI with indwelling Foley.  AMS is resolved, Foley removed and patient is medically stable for SNF.  History of urinary retention Condom cath in place, patient is asymptomatic.  Bladder scans show appropriate voiding. - Flomax daily  - Continue finasteride - Discontinued bladder scans - Outpatient urology follow-up  UTI (urinary tract infection) Stable, asymptomatic. - Continue Bactrim q12h (end date 11/14 per previous notes) - Urology follow-up outpatient  Hypoglycemia associated with diabetes ([place]) Stable.Restarted on SSI, has received 6 Units Novolog in the last 24h. - CBGs QAC and QHS - Monitor for hypoglycemia  Hematuria Hemoglobin stable.  Microscopic blood without dysuria.  Suggest urology outpatient follow-up. -continue to f/u CBC -Cont eliquis and DAPT for now, unless hgb worsens   FEN/GI: Regular PPx: Eliquis Dispo:SNF  pending placement.  Subjective:  Patient has no acute concerns or questions today.  Objective: Temp:  [98 F (36.7 C)-98.7 F (37.1 C)] 98.2 F (36.8 C) (10/23 0751) Pulse Rate:  [79-83] 79 (10/22 2352) Resp:  [18] 18 (10/23 0751) BP: (92-97)/(58-64) 92/64 (10/23 0751) SpO2:  [95 %-98 %] 98 % (10/23 0751) Weight:  [92.2 kg] 92.2 kg (10/23 0429) Physical Exam: General: Not in acute distress, pleasant Cardiovascular: RRR, no MRG Respiratory: CTAB, normal work of breathing on room air Abdomen: Soft, nontender.  Bowel sounds present.  Laboratory: Most recent CBC Lab Results  Component Value Date   WBC  7.1 08/11/2022   HGB 10.3 (L) 08/11/2022   HCT 30.6 (L) 08/11/2022   MCV 87.7 08/11/2022   PLT 230 08/11/2022   Most recent BMP    Latest Ref Rng & Units 08/12/2022    4:21 AM  BMP  Glucose 70 - 99 mg/dL 215   BUN 8 - 23 mg/dL 21   Creatinine 0.61 - 1.24 mg/dL 1.30   Sodium 135 - 145 mmol/L 132   Potassium 3.5 - 5.1 mmol/L 4.5   Chloride 98 - 111 mmol/L 104   CO2 22 - 32 mmol/L 18   Calcium 8.9 - 10.3 mg/dL 8.4    Leslie Dales, DO 08/12/2022, 8:28 AM  PGY-1, [place] Intern pager: [phone], text pages welcome Secure chat group [place]

## 2022-08-12 NOTE — TOC Progression Note (Signed)
Transition of Care [facility]) - Progression Note    Patient Details  Name: Johnatha Zeidman MRN: [id number] Date of Birth: [birth year]  Transition of Care [facility]) CM/SW [place], LCSW Phone Number: 08/12/2022, 10:07 AM  Clinical Narrative:    Insurance approval still pending per [facility]   Expected Discharge Plan: [place] Barriers to Discharge: Continued Medical Work up, Ship broker, SNF Pending bed offer, SNF Covid  Expected Discharge Plan and Services Expected Discharge Plan: [place] In-house Referral: Clinical Social Work   Post Acute Care Choice: [place] Living arrangements for the past 2 months: [place]                                       Social Determinants of Health (SDOH) Interventions    Readmission Risk Interventions     No data to display

## 2022-08-13 DIAGNOSIS — N3001 Acute cystitis with hematuria: Secondary | ICD-10-CM | POA: Diagnosis not present

## 2022-08-13 LAB — GLUCOSE, CAPILLARY
Glucose-Capillary: 232 mg/dL — ABNORMAL HIGH (ref 70–99)
Glucose-Capillary: 192 mg/dL — ABNORMAL HIGH (ref 70–99)
Glucose-Capillary: 232 mg/dL — ABNORMAL HIGH (ref 70–99)
Glucose-Capillary: 199 mg/dL — ABNORMAL HIGH (ref 70–99)

## 2022-08-13 MED ORDER — METFORMIN HCL 500 MG PO TABS
1000.0000 mg | ORAL_TABLET | Freq: Two times a day (BID) | ORAL | Status: DC
  Administered 2022-08-13 – 2022-08-16 (×7): 1000 mg via ORAL
  Filled 2022-08-13 (×7): qty 2

## 2022-08-13 NOTE — Progress Notes (Signed)
Physical Therapy Treatment Patient Details Name: Peter Yang MRN: [id number] DOB: 16-Apr-1956 Today's Date: 08/13/2022   History of Present Illness 66 year old male presenting with acute metabolic encephalopathy in the setting of hypoglycemia and sepsis from suspected urinary source.  Also with positive COVID test.  PMH BPH; DM; HTN; PVD s/p L BKA; CAD s/p stent; HLD, hx of TIA, OSA who was recently discharged on 8/29 from [facility] for a NSTEMI and hypoglycemia.    PT Comments    Pt seen for PT tx with pt agreeable to session. Pt is able to complete supine<>sit with supervision with [facility] elevated & bed rails, which is a great improvement compared to last PT note. Pt still with standard recliner in room -- requested recliner with drop arm recliner from nursing staff. Pt demonstrates a lack of ~25 degrees from full extension in R knee but pt reports this is baseline 2/2 arthritis. Pt attempts STS from elevated EOB with pt able to clear buttocks but unable to come to upright standing. PT increased EOB even further & pt able to transfer STS x 3 with successful transition of BUE to RW. Pt tolerates standing <10 seconds on first two attempts with PT providing distraction for 3rd attempt & pt able to tolerate standing 1 min 40 seconds. Pt states he feels his RLE could have supported him longer but he was becoming SOB. Pt pleased with progress during session.  Notified nurse of bleeding area to L shin PT observed while pt was sitting EOB.    Recommendations for follow up therapy are one component of a multi-disciplinary discharge planning process, led by the attending physician.  Recommendations may be updated based on patient status, additional functional criteria and insurance authorization.  Follow Up Recommendations  Skilled nursing-short term rehab (<3 hours/day) Can patient physically be transported by private vehicle: No   Assistance Recommended at Discharge Frequent or constant Supervision/Assistance   Patient can return home with the following Two people to help with walking and/or transfers;Help with stairs or ramp for entrance;A little help with bathing/dressing/bathroom;Assistance with cooking/housework;Assist for transportation   Equipment Recommendations   (consider hospital bed)    Recommendations for Other Services       Precautions / Restrictions Precautions Precautions: Fall Precaution Comments: L BKA (prosthesis not here), off covid precautions 10/24 Restrictions Weight Bearing Restrictions: No     Mobility  Bed Mobility Overal bed mobility: Needs Assistance Bed Mobility: Supine to Sit, Sit to Supine, Rolling Rolling: Supervision (roll L with supervision with bed rail)   Supine to sit: Supervision, HOB elevated Sit to supine: Supervision, HOB elevated   General bed mobility comments: Pt is able to scoot to [facility] with bed in trendelenburg position with use of bed rails, cuing for technique.    Transfers Overall transfer level: Needs assistance Equipment used: Rolling walker (2 wheels) Transfers: Sit to/from Stand Sit to Stand: Mod assist, Min assist           General transfer comment: STS from very elevated EOB with RW & min/mod assist. Pt pushes to stand with BUE on bed & requires extra time to transition first LUE then RUE to RW.    Ambulation/Gait                   Stairs             Wheelchair Mobility    Modified Rankin (Stroke Patients Only)       Balance Overall balance assessment: Needs assistance Sitting-balance support: No  upper extremity supported, Feet supported Sitting balance-Leahy Scale: Good Sitting balance - Comments: Pt able to don post op shoe on RLE while sitting EOB with supervision.   Standing balance support: Reliant on assistive device for balance, Bilateral upper extremity supported, During functional activity Standing balance-Leahy Scale: Poor Standing balance comment: Min assist for static standing with  RW at EOB.                            Cognition Arousal/Alertness: Awake/alert Behavior During Therapy: Flat affect Overall Cognitive Status: Within Functional Limits for tasks assessed                                 General Comments: Follows simple commands throughout session with extra time to initiate some tasks. Pt eager to participate & appreciative of session at end.        Exercises      General Comments General comments (skin integrity, edema, etc.): Pt c/o SOB after last standing attempt but SpO2 96% on room air.      Pertinent Vitals/Pain Pain Assessment Pain Assessment: No/denies pain    Home Living                          Prior Function            PT Goals (current goals can now be found in the care plan section) Acute Rehab PT Goals Patient Stated Goal: get better PT Goal Formulation: With patient Time For Goal Achievement: 08/18/22 Potential to Achieve Goals: Fair Progress towards PT goals: Progressing toward goals    Frequency    Min 2X/week      PT Plan Current plan remains appropriate    Co-evaluation              AM-PAC PT "6 Clicks" Mobility   Outcome Measure  Help needed turning from your back to your side while in a flat bed without using bedrails?: None Help needed moving from lying on your back to sitting on the side of a flat bed without using bedrails?: A Little Help needed moving to and from a bed to a chair (including a wheelchair)?: Total Help needed standing up from a chair using your arms (e.g., wheelchair or bedside chair)?: A Lot Help needed to walk in hospital room?: Total Help needed climbing 3-5 steps with a railing? : Total 6 Click Score: 12    End of Session Equipment Utilized During Treatment: Gait belt Activity Tolerance: Patient tolerated treatment well Patient left: in bed;with call bell/phone within reach;with bed alarm set Nurse Communication:  (notified of bleeding  skin tear on R shin, as well as need for new condom catheter as current one fell off) PT Visit Diagnosis: Other abnormalities of gait and mobility (R26.89);Muscle weakness (generalized) (M62.81)     Time: 8101-7510 PT Time Calculation (min) (ACUTE ONLY): 26 min  Charges:  $Therapeutic Activity: 23-37 mins                     Lavone Nian, PT, DPT 08/13/22, 3:33 PM   Waunita Schooner 08/13/2022, 3:31 PM

## 2022-08-13 NOTE — Progress Notes (Signed)
     Daily Progress Note Intern Pager: [phone]  Patient name: Peter Yang Medical record number: [id number] Date of birth: 1956/10/21 Age: 66 y.o. Gender: male  Primary Care Provider: Clovia Cuff, MD Consultants: None Code Status: DNR  Pt Overview and Major Events to Date:  10/13-admitted  Assessment and Plan:  Peter Yang is a 66 year old male who presented with altered mental status in setting of hypoglycemia and suspected UTI with indwelling Foley.  AMS is resolved, Foley removed and patient is medically stable for SNF.  History of urinary retention Condom cath in place, patient is asymptomatic.  Bladder scans show appropriate voiding. - Flomax daily  - Continue finasteride - Discontinued bladder scans - Outpatient urology follow-up  UTI (urinary tract infection) Stable, asymptomatic. - Continue Bactrim q12h (end date 11/14 per previous notes) - Urology follow-up outpatient  Hypoglycemia associated with diabetes ([place]) Stable. - continue SSI - started metformin - CBGs QAC and QHS - Monitor for hypoglycemia  Hematuria Hemoglobin stable.  Microscopic blood without dysuria.  Suggest urology outpatient follow-up. -Cont eliquis and DAPT       FEN/GI: Regular PPx: Eliquis Dispo:SNF  pending placement.  Subjective:  No concerns this morning.  Denies abdominal pain, CP/SOB.  Objective: Temp:  [97.5 F (36.4 C)-98 F (36.7 C)] 98 F (36.7 C) (10/24 0740) Pulse Rate:  [79-85] 79 (10/24 0740) Resp:  [18-20] 20 (10/24 0740) BP: (86-99)/(54-63) 86/57 (10/24 0740) SpO2:  [94 %-96 %] 94 % (10/24 0740) Weight:  [90.7 kg] 90.7 kg (10/24 0500) Physical Exam: General: NAD, alert Cardiovascular: RRR no MRG Respiratory: CTAB normal WOB on RA Abdomen: Soft, NT/ND   Laboratory: Most recent CBC Lab Results  Component Value Date   WBC 7.1 08/11/2022   HGB 10.3 (L) 08/11/2022   HCT 30.6 (L) 08/11/2022   MCV 87.7 08/11/2022   PLT 230 08/11/2022   Most  recent BMP    Latest Ref Rng & Units 08/12/2022    9:08 PM  BMP  Glucose 70 - 99 mg/dL 232   BUN 8 - 23 mg/dL 20   Creatinine 0.61 - 1.24 mg/dL 1.27   Sodium 135 - 145 mmol/L 131   Potassium 3.5 - 5.1 mmol/L 4.6   Chloride 98 - 111 mmol/L 101   CO2 22 - 32 mmol/L 21   Calcium 8.9 - 10.3 mg/dL 8.5      August Albino, MD 08/13/2022, 1:18 PM  PGY-1, [place] Intern pager: [phone], text pages welcome Secure chat group [place]

## 2022-08-13 NOTE — TOC Progression Note (Signed)
Transition of Care [facility]) - Progression Note    Patient Details  Name: Peter Yang MRN: [id number] Date of Birth: 1956-04-18  Transition of [facility]) CM/SW [place], LCSW Phone Number: 08/13/2022, 12:30 PM  Clinical Narrative:    Insurance authorization still pending per [facility]. CSW will fax updated clinicals in case that is needed.   Discussed case with [facility] and was advised to contact [name] with [facility] to help expedite the auth.   Expected Discharge Plan: [place] Barriers to Discharge: Continued Medical Work up, Ship broker, SNF Pending bed offer, SNF Covid  Expected Discharge Plan and Services Expected Discharge Plan: [place] In-house Referral: Clinical Social Work   Post Acute Care Choice: [place] Living arrangements for the past 2 months: [place]                                       Social Determinants of Health (SDOH) Interventions    Readmission Risk Interventions     No data to display

## 2022-08-13 NOTE — Inpatient Diabetes Management (Signed)
Inpatient Diabetes Program Recommendations  AACE/ADA: New Consensus Statement on Inpatient Glycemic Control (2015)  Target Ranges:  Prepandial:   less than 140 mg/dL      Peak postprandial:   less than 180 mg/dL (1-2 hours)      Critically ill patients:  140 - 180 mg/dL    Latest Reference Range & Units 08/12/22 07:49 08/12/22 12:35 08/12/22 15:34 08/12/22 20:58  Glucose-Capillary 70 - 99 mg/dL 181 (H)  2 units Novolog  263 (H)  7 units Novolog  222 (H)  3 units Novolog  238 (H)  2 units Novolog   (H): Data is abnormally high  Latest Reference Range & Units 08/13/22 07:37  Glucose-Capillary 70 - 99 mg/dL 232 (H)  3 units Novolog   (H): Data is abnormally high    Home DM Meds: Jardiance 10 mg daily     Lantus 13 units QHS     Humalog 1-14 units BID per SSI    Metformin 1000 mg BID    Current: Novolog 0-9 units TID ac/hs    MD- Note CBGs >200  Please consider:  1. Start Semglee 6 units QHS (50% home dose)  2. Start Novolog Meal Coverage: Novolog 2 units TID with meals HOLD if pt NPO HOLD if pt eats <50% meals    --Will follow patient during hospitalization--  Wyn Quaker RN, MSN, [place] Diabetes Coordinator Inpatient Glycemic Control Team Team Pager: [phone] (8a-5p)

## 2022-08-14 DIAGNOSIS — E1151 Type 2 diabetes mellitus with diabetic peripheral angiopathy without gangrene: Secondary | ICD-10-CM

## 2022-08-14 DIAGNOSIS — N3001 Acute cystitis with hematuria: Secondary | ICD-10-CM | POA: Diagnosis not present

## 2022-08-14 LAB — GLUCOSE, CAPILLARY
Glucose-Capillary: 235 mg/dL — ABNORMAL HIGH (ref 70–99)
Glucose-Capillary: 240 mg/dL — ABNORMAL HIGH (ref 70–99)
Glucose-Capillary: 183 mg/dL — ABNORMAL HIGH (ref 70–99)
Glucose-Capillary: 204 mg/dL — ABNORMAL HIGH (ref 70–99)

## 2022-08-14 NOTE — TOC Progression Note (Signed)
Transition of Care [facility]) - Progression Note    Patient Details  Name: Peter Yang MRN: [id number] Date of Birth: Dec 31, 1955  Transition of Care [facility]) CM/SW [place], [place] Phone Number: 08/14/2022, 12:46 PM  Clinical Narrative:    Per [facility] [name] no longer is Forensic psychologist.   CSW contacted the BCBS number on the patient's Facesheet. They transferred me to [phone]). Representative checked patient's file and stated no SNF authorization has been started, only the hospital authorization. CSW requested info on how to proceed with the authorization and was told the SNF should be the one to start it. CSW explained that the SNF supposedly had already started it so CSW would like to handle it at this point as days have been lost waiting. He reported agreement and CSW obtained SNF/MD NPI info from [facility] while on the phone with him. He provided CSW with the Ref# [id number], Case# [id number]. CSW faxed clinicals to f. [phone] and made [facility] Admissions aware that [name] had never been started.  CSW provided update to patient. He stated he thought his Medicare was primary since he isn't working anymore but CSW made him aware that the hospital has been obtaining approval from [employer] [facility].    Expected Discharge Plan: [place] Barriers to Discharge: Continued Medical Work up, Ship broker, SNF Pending bed offer, SNF Covid  Expected Discharge Plan and Services Expected Discharge Plan: [place] In-house Referral: Clinical Social Work   Post Acute Care Choice: [place] Living arrangements for the past 2 months: [place]                                       Social Determinants of Health (SDOH) Interventions    Readmission Risk Interventions     No data to display

## 2022-08-14 NOTE — Progress Notes (Signed)
     Daily Progress Note Intern Pager: [phone]  Patient name: Peter Yang Medical record number: [id number] Date of birth: Feb 23, 1956 Age: 66 y.o. Gender: male  Primary Care Provider: Clovia Cuff, MD Consultants: None Code Status: DNR  Pt Overview and Major Events to Date:  10/13-admitted  Assessment and Plan:  Peter Yang is a 66 year old male who presented with altered mental status in setting of hypoglycemia and suspected UTI with indwelling Foley.  AMS is resolved, Foley removed and patient is medically stable for SNF.     History of urinary retention Condom cath in place, patient is asymptomatic.  Bladder scans show appropriate voiding. - Flomax daily  - Continue finasteride - Discontinued bladder scans - Outpatient urology follow-up  UTI (urinary tract infection) Stable, asymptomatic. - Continue Bactrim q12h (end date 11/14 per previous notes) - Urology follow-up outpatient  Hypoglycemia associated with diabetes ([place]) Blood sugar steadily increasing with diet.  Stable. -Change to diabetic diet, continue to monitor sugars, consider starting long-acting insulin (6 units Semglee) if still elevated - continue SSI - started metformin - CBGs QAC and QHS - Monitor for hypoglycemia  Hematuria Hemoglobin stable.  Microscopic blood without dysuria.  Suggest urology outpatient follow-up. -Cont eliquis and DAPT       FEN/GI: Carb modified PPx: Eliquis Dispo:SNF  pending placement.  Subjective:  No acute concerns this morning, states he is still okay with going to SNF but is starting to want to go home.  Denies pain, SOB.  Objective: Temp:  [97.9 F (36.6 C)-98.3 F (36.8 C)] 97.9 F (36.6 C) (10/25 0813) Pulse Rate:  [77-94] 94 (10/25 0813) Resp:  [17-19] 17 (10/25 0813) BP: (81-101)/(48-62) 101/62 (10/25 0813) SpO2:  [97 %-98 %] 98 % (10/25 0813) Weight:  [89.9 kg] 89.9 kg (10/25 0500) Physical Exam: General: NAD, alert and  responsive Cardiovascular: RRR no MRG Respiratory: CTAB normal WOB on RA Abdomen: Soft, NT/ND   Laboratory: Most recent CBC Lab Results  Component Value Date   WBC 7.1 08/11/2022   HGB 10.3 (L) 08/11/2022   HCT 30.6 (L) 08/11/2022   MCV 87.7 08/11/2022   PLT 230 08/11/2022   Most recent BMP    Latest Ref Rng & Units 08/12/2022    9:08 PM  BMP  Glucose 70 - 99 mg/dL 232   BUN 8 - 23 mg/dL 20   Creatinine 0.61 - 1.24 mg/dL 1.27   Sodium 135 - 145 mmol/L 131   Potassium 3.5 - 5.1 mmol/L 4.6   Chloride 98 - 111 mmol/L 101   CO2 22 - 32 mmol/L 21   Calcium 8.9 - 10.3 mg/dL 8.5      August Albino, MD 08/14/2022, 11:54 AM  PGY-1, [place] Intern pager: [phone], text pages welcome Secure chat group [place]

## 2022-08-14 NOTE — Progress Notes (Signed)
Occupational Therapy Treatment Patient Details Name: Peter Yang MRN: [id number] DOB: 23-May-1956 Today's Date: 08/14/2022   History of present illness 66 year old male presenting with acute metabolic encephalopathy in the setting of hypoglycemia and sepsis from suspected urinary source.  Also with positive COVID test.  PMH BPH; DM; HTN; PVD s/p L BKA; CAD s/p stent; HLD, hx of TIA, OSA who was recently discharged on 8/29 from [facility] for a NSTEMI and hypoglycemia.   OT comments  Pt with much improvement in attention, awareness and sit to stand from previous session last week with this therapist.  He was able to complete stand pivot transfer with use of the RW with mod assist to and from the bed.  Standing endurance for intervals of 40 secs and 1 min with simulated toileting hygiene.  BP remains low at times with 88/52 supine and then 90/57 in sitting.  No reports of dizziness or light headedness.  Recommend continued acute care OT to work toward updated min assist level goals.  Still recommend SNF for follow-up secondary to not having 24 hr supervision.    Recommendations for follow up therapy are one component of a multi-disciplinary discharge planning process, led by the attending physician.  Recommendations may be updated based on patient status, additional functional criteria and insurance authorization.    Follow Up Recommendations  Skilled nursing-short term rehab (<3 hours/day)    Assistance Recommended at Discharge Frequent or constant Supervision/Assistance  Patient can return home with the following  A little help with walking and/or transfers;A little help with bathing/dressing/bathroom;Assist for transportation   Equipment Recommendations  Other (comment) (TBD next venue of care)       Precautions / Restrictions Precautions Precautions: Fall Precaution Comments: L BKA (prosthesis not here), off covid precautions 10/24 Restrictions Weight Bearing Restrictions: No        Mobility Bed Mobility Overal bed mobility: Needs Assistance Bed Mobility: Supine to Sit, Sit to Supine, Rolling Rolling: Min guard   Supine to sit: Min guard Sit to supine: Supervision, HOB elevated        Transfers   Equipment used: Rolling walker (2 wheels) Transfers: Sit to/from Stand Sit to Stand: Mod assist     Step pivot transfers: Mod assist     General transfer comment: Pt completed sit to stand from the EOB with mod assist and from the [facility] with min assist.     Balance Overall balance assessment: Needs assistance Sitting-balance support: No upper extremity supported, Feet supported Sitting balance-Leahy Scale: Good     Standing balance support: Reliant on assistive device for balance, Bilateral upper extremity supported, During functional activity Standing balance-Leahy Scale: Poor Standing balance comment: Needs use of the RW for support when standing.                           ADL either performed or assessed with clinical judgement   ADL Overall ADL's : Needs assistance/impaired                     Lower Body Dressing: Sit to/from stand;Moderate assistance Lower Body Dressing Details (indicate cue type and reason): from elevated surface sit to stand Toilet Transfer: Moderate assistance;BSC/3in1;Stand-pivot           Functional mobility during ADLs: Moderate assistance;Rolling walker (2 wheels) General ADL Comments: Pt currently mod assist for sit to stand from the raised EOB, min assist for sit to stand from the raised [facility] with use of  the RW for both.  He was then able to take some hops forward and backwards and then to and from the wide BSC.      Cognition Arousal/Alertness: Awake/alert Behavior During Therapy: WFL for tasks assessed/performed Overall Cognitive Status: Within Functional Limits for tasks assessed                         Following Commands: Follows one step commands with increased time, Follows  multi-step commands consistently   Awareness: Emergent Problem Solving: Requires verbal cues General Comments: Pt much clearer this session compared to last session cognitively and with sustained attention to tasks.                   Pertinent Vitals/ Pain       Pain Assessment Pain Assessment: Faces Faces Pain Scale: Hurts a little bit Pain Location: right knee Pain Descriptors / Indicators: Discomfort, Guarding Pain Intervention(s): Limited activity within patient's tolerance, Repositioned         Frequency  Min 2X/week        Progress Toward Goals  OT Goals(current goals can now be found in the care plan section)  Progress towards OT goals: Goals met and updated - see care plan  Acute Rehab OT Goals Patient Stated Goal: Pt wants to get stronger and better at standing OT Goal Formulation: With patient Time For Goal Achievement: 08/28/22 Potential to Achieve Goals: Good  Plan Discharge plan remains appropriate       AM-PAC OT "6 Clicks" Daily Activity     Outcome Measure   Help from another person eating meals?: None Help from another person taking care of personal grooming?: A Little Help from another person toileting, which includes using toliet, bedpan, or urinal?: A Lot Help from another person bathing (including washing, rinsing, drying)?: A Lot Help from another person to put on and taking off regular upper body clothing?: A Lot Help from another person to put on and taking off regular lower body clothing?: A Lot 6 Click Score: 15    End of Session Equipment Utilized During Treatment: Gait belt;Other (comment);Rolling walker (2 wheels)  OT Visit Diagnosis: Muscle weakness (generalized) (M62.81);Unsteadiness on feet (R26.81);Other symptoms and signs involving cognitive function;Repeated falls (R29.6)   Activity Tolerance Patient tolerated treatment well   Patient Left in bed;with call bell/phone within reach;with bed alarm set   Nurse Communication  Mobility status        Time: 1110-1144 OT Time Calculation (min): 34 min  Charges: OT General Charges $OT Visit: 1 Visit OT Treatments $Self Care/Home Management : 23-37 mins  Layson Bertsch OTR/L 08/14/2022, 1:55 PM

## 2022-08-15 LAB — GLUCOSE, CAPILLARY
Glucose-Capillary: 157 mg/dL — ABNORMAL HIGH (ref 70–99)
Glucose-Capillary: 129 mg/dL — ABNORMAL HIGH (ref 70–99)
Glucose-Capillary: 151 mg/dL — ABNORMAL HIGH (ref 70–99)
Glucose-Capillary: 148 mg/dL — ABNORMAL HIGH (ref 70–99)

## 2022-08-15 NOTE — Progress Notes (Signed)
Physical Therapy Treatment Patient Details Name: Peter Yang MRN: [id number] DOB: Feb 24, 1956 Today's Date: 08/15/2022   History of Present Illness 66 year old male presenting with acute metabolic encephalopathy in the setting of hypoglycemia and sepsis from suspected urinary source.  Also with positive COVID test.  PMH BPH; DM; HTN; PVD s/p L BKA; CAD s/p stent; HLD, hx of TIA, OSA who was recently discharged on 8/29 from [facility] for a NSTEMI and hypoglycemia.    PT Comments    The pt was agreeable to session, able to demo good progress in standing tolerance, decreased assist to complete sit-stand transfers, and improved activity tolerance. The pt's BP was soft prior to session, but improved and was maintained with all changes in position and activity. Reports he is limited in progression at this time due to R knee pain with wt bearing, but was able to demo great standing balance and improved strength for repeated sit-stand transfers. Will continue to benefit from skilled PT to progress power in RLE and stability to complete transfers from various heights and various surfaces without assist.   VITALS:  - supine in bed- BP: 88/51 (58);  - sitting EOB - BP: 98/57 (68); HR: 88bpm - sitting EOB after 1 min stand - BP: 102/54 (70); HR: 91bpm - sitting after 1 min 30 sec stand - BP: 103/55 (70); HR: 78bpm    Recommendations for follow up therapy are one component of a multi-disciplinary discharge planning process, led by the attending physician.  Recommendations may be updated based on patient status, additional functional criteria and insurance authorization.  Follow Up Recommendations  Skilled nursing-short term rehab (<3 hours/day) Can patient physically be transported by private vehicle: No   Assistance Recommended at Discharge Frequent or constant Supervision/Assistance  Patient can return home with the following Two people to help with walking and/or transfers;Help with stairs or ramp for  entrance;A little help with bathing/dressing/bathroom;Assistance with cooking/housework;Assist for transportation   Equipment Recommendations  None recommended by PT    Recommendations for Other Services       Precautions / Restrictions Precautions Precautions: Fall Precaution Comments: L BKA (prosthesis not here), off covid precautions 10/24, soft BP Restrictions Weight Bearing Restrictions: No     Mobility  Bed Mobility Overal bed mobility: Needs Assistance Bed Mobility: Supine to Sit, Sit to Supine, Rolling     Supine to sit: Supervision Sit to supine: Supervision   General bed mobility comments: supervision, no assist but pt does use increased time    Transfers Overall transfer level: Needs assistance Equipment used: Rolling walker (2 wheels) Transfers: Sit to/from Stand Sit to Stand: Min guard, From elevated surface           General transfer comment: pt dependent on elevated surface but completed with minG x5 in session. dependent on BUE support    Ambulation/Gait               General Gait Details: pt declined due to knee pain but then states knee pain 2/10, unable to give more clear answer       Balance Overall balance assessment: Needs assistance Sitting-balance support: No upper extremity supported, Feet supported Sitting balance-Leahy Scale: Good     Standing balance support: Reliant on assistive device for balance, Bilateral upper extremity supported, During functional activity Standing balance-Leahy Scale: Poor Standing balance comment: Needs use of the RW for support when standing.  Cognition Arousal/Alertness: Awake/alert Behavior During Therapy: WFL for tasks assessed/performed, Flat affect Overall Cognitive Status: Within Functional Limits for tasks assessed                                 General Comments: pt with flat affect but following all cues and instructions and was very  pleasant        Exercises      General Comments General comments (skin integrity, edema, etc.): BP soft initially, but improved with any mobility      Pertinent Vitals/Pain Pain Assessment Pain Assessment: 0-10 Pain Score: 3  Pain Location: right knee Pain Descriptors / Indicators: Discomfort, Guarding Pain Intervention(s): Limited activity within patient's tolerance, Monitored during session, Repositioned     PT Goals (current goals can now be found in the care plan section) Acute Rehab PT Goals Patient Stated Goal: to get better and return home PT Goal Formulation: With patient Time For Goal Achievement: 08/29/22 Potential to Achieve Goals: Fair Progress towards PT goals: Progressing toward goals    Frequency    Min 2X/week      PT Plan Current plan remains appropriate       AM-PAC PT "6 Clicks" Mobility   Outcome Measure  Help needed turning from your back to your side while in a flat bed without using bedrails?: None Help needed moving from lying on your back to sitting on the side of a flat bed without using bedrails?: A Little Help needed moving to and from a bed to a chair (including a wheelchair)?: A Little Help needed standing up from a chair using your arms (e.g., wheelchair or bedside chair)?: A Little Help needed to walk in hospital room?: Total Help needed climbing 3-5 steps with a railing? : Total 6 Click Score: 15    End of Session Equipment Utilized During Treatment: Gait belt Activity Tolerance: Patient tolerated treatment well Patient left: in bed;with call bell/phone within reach;with bed alarm set Nurse Communication: Mobility status PT Visit Diagnosis: Other abnormalities of gait and mobility (R26.89);Muscle weakness (generalized) (M62.81)     Time: 3159-4585 PT Time Calculation (min) (ACUTE ONLY): 28 min  Charges:  $Therapeutic Exercise: 8-22 mins $Therapeutic Activity: 8-22 mins                     West Carbo, PT, DPT   Acute  Rehabilitation Department   Sandra Cockayne 08/15/2022, 5:31 PM

## 2022-08-15 NOTE — Progress Notes (Signed)
   08/15/22 1300  Assess: MEWS Score  Temp 98.4 F (36.9 C)  BP (!) 77/52  Pulse Rate 82  ECG Heart Rate 88  Resp 18  Level of Consciousness Alert  SpO2 100 %  O2 Device Room Air  Assess: MEWS Score  MEWS Temp 0  MEWS Systolic 2  MEWS Pulse 0  MEWS RR 0  MEWS LOC 0  MEWS Score 2  MEWS Score Color Yellow  Assess: if the MEWS score is Yellow or Red  Were vital signs taken at a resting state? Yes  Focused Assessment No change from prior assessment  Does the patient meet 2 or more of the SIRS criteria? No  MEWS guidelines implemented *See Row Information* Yes  Treat  MEWS Interventions Escalated (See documentation below)  Pain Scale 0-10  Pain Score 0  Take Vital Signs  Increase Vital Sign Frequency  Yellow: Q 2hr X 2 then Q 4hr X 2, if remains yellow, continue Q 4hrs  Escalate  MEWS: Escalate Yellow: discuss with charge nurse/RN and consider discussing with provider and RRT  Notify: Charge Nurse/RN  Name of Charge Nurse/RN Notified Erin RN  Date Charge Nurse/RN Notified 08/15/22  Time Charge Nurse/RN Notified 1305  Notify: Provider  Provider Name/Title Teaching Services MD  Date Provider Notified 08/15/22  Time Provider Notified 1302  Method of Notification Call  Notification Reason Other (Comment) (yellow mews, low BP)  Provider response No new orders  Date of Provider Response 08/15/22  Time of Provider Response 1302  Assess: SIRS CRITERIA  SIRS Temperature  0  SIRS Pulse 0  SIRS Respirations  0  SIRS WBC 1  SIRS Score Sum  1

## 2022-08-15 NOTE — Progress Notes (Addendum)
Pt's blood pressures in the 93J and 03E systolic, pt is asymptomatic. MD made aware with no new orders at this time.

## 2022-08-15 NOTE — TOC Progression Note (Signed)
Transition of Care [facility]) - Progression Note    Patient Details  Name: Peter Yang MRN: [id number] Date of Birth: August 19, 1956  Transition of Care [facility]) CM/SW Elaine, LCSW Phone Number: 08/15/2022, 6:01 PM  Clinical Narrative:    CSW received insurance approval for patient to go to [facility], Ref# [id number], for 7 days, review due 11/3 (CM Amy p. [phone]). [facility] able to accept patient tomorrow as they stated they are out of beds.   CSW made patient aware and patient became upset stating he did not agree to go to [facility]. CSW reminded patient of previous conversations, including where he did not want to return to [place] but he stated he never said any of that and had never been to [place]. Per chart review, patient had been discharged there from [facility] in September of 2022. CSW went over bed choices again and patient stated he didn't know any of the places and couldn't be expected to choose one. CSW again asked what was most important to patient, as he had initially indicated that he wanted a private room, which is how he chose [facility]. Patient considered and stated he did want a private room. CSW confirmed that [facility] is the option with the private room. Patient reported agreement.    Expected Discharge Plan: [place] Barriers to Discharge: Continued Medical Work up, Ship broker, SNF Pending bed offer, SNF Covid  Expected Discharge Plan and Services Expected Discharge Plan: [place] In-house Referral: Clinical Social Work   Post Acute Care Choice: [place] Living arrangements for the past 2 months: [place]                                       Social Determinants of Health (SDOH) Interventions    Readmission Risk Interventions     No data to display

## 2022-08-15 NOTE — Progress Notes (Signed)
     Daily Progress Note Intern Pager: [phone]  Patient name: Peter Yang Medical record number: [id number] Date of birth: 10-28-1955 Age: 66 y.o. Gender: male  Primary Care Provider: Clovia Cuff, MD Consultants: None Code Status: DNR  Pt Overview and Major Events to Date:  10/13-admitted  Assessment and Plan:  Peter Yang is a 66 year old male who presented with altered mental status in setting of hypoglycemia and suspected UTI with indwelling Foley.  AMS is resolved, Foley removed and patient is medically stable for SNF.    History of urinary retention Condom cath in place, patient is asymptomatic.  Bladder scans show appropriate voiding. - Flomax daily  - Continue finasteride - Discontinued bladder scans - Outpatient urology follow-up  UTI (urinary tract infection) Stable, asymptomatic aside from chronic hematuria. - Continue Bactrim q12h (end date 11/14 per previous notes) - Urology follow-up outpatient  Hypoglycemia associated with diabetes ([place]) Cautiously adding back diabetes regimen due to initial concern for hypoglycemia.  Blood sugar steadily increased with diet.  Stable. -Change to diabetic diet, continue to monitor sugars, consider starting long-acting insulin (6 units Semglee) if still elevated - continue SSI - started metformin - CBGs QAC and QHS - Monitor for hypoglycemia  Hematuria Hemoglobin stable.  Microscopic blood without dysuria.  Suggest urology outpatient follow-up. -Cont eliquis and DAPT       FEN/GI: Carb modified PPx: Eliquis Dispo:SNF pending placement  Subjective:  No concerns this morning, denies pain, denies SOB  Objective: Temp:  [97.5 F (36.4 C)-98.2 F (36.8 C)] 98.2 F (36.8 C) (10/26 0745) Pulse Rate:  [86-91] 86 (10/26 0745) Resp:  [17-18] 18 (10/26 0745) BP: (83-85)/(47-60) 83/56 (10/26 0745) SpO2:  [93 %-100 %] 100 % (10/26 0745) Physical Exam: General: NAD, alert and responsive Cardiovascular: RRR no  MRG Respiratory: CTAB normal WOB on RA Abdomen: Soft, NT/ND  Laboratory: Most recent CBC Lab Results  Component Value Date   WBC 7.1 08/11/2022   HGB 10.3 (L) 08/11/2022   HCT 30.6 (L) 08/11/2022   MCV 87.7 08/11/2022   PLT 230 08/11/2022   Most recent BMP    Latest Ref Rng & Units 08/12/2022    9:08 PM  BMP  Glucose 70 - 99 mg/dL 232   BUN 8 - 23 mg/dL 20   Creatinine 0.61 - 1.24 mg/dL 1.27   Sodium 135 - 145 mmol/L 131   Potassium 3.5 - 5.1 mmol/L 4.6   Chloride 98 - 111 mmol/L 101   CO2 22 - 32 mmol/L 21   Calcium 8.9 - 10.3 mg/dL 8.5      Peter Albino, MD 08/15/2022, 9:51 AM  PGY-1, [place] Intern pager: [phone], text pages welcome Secure chat group [place]

## 2022-08-16 LAB — GLUCOSE, CAPILLARY
Glucose-Capillary: 159 mg/dL — ABNORMAL HIGH (ref 70–99)
Glucose-Capillary: 126 mg/dL — ABNORMAL HIGH (ref 70–99)
Glucose-Capillary: 116 mg/dL — ABNORMAL HIGH (ref 70–99)
Glucose-Capillary: 145 mg/dL — ABNORMAL HIGH (ref 70–99)

## 2022-08-16 MED ORDER — APIXABAN 5 MG PO TABS: 5.0000 mg | ORAL_TABLET | Freq: Two times a day (BID) | ORAL | 0 refills | Status: DC

## 2022-08-16 MED ORDER — CLOPIDOGREL BISULFATE 75 MG PO TABS: 75.0000 mg | ORAL_TABLET | Freq: Every day | ORAL | 0 refills | Status: DC

## 2022-08-16 MED ORDER — ASPIRIN 81 MG PO CHEW: 81.0000 mg | CHEWABLE_TABLET | Freq: Every day | ORAL | 0 refills | Status: DC

## 2022-08-16 MED ORDER — SULFAMETHOXAZOLE-TRIMETHOPRIM 800-160 MG PO TABS: 1.0000 | ORAL_TABLET | Freq: Two times a day (BID) | ORAL | 0 refills | Status: DC

## 2022-08-16 NOTE — Progress Notes (Signed)
Occupational Therapy Treatment Patient Details Name: Peter Yang MRN: [id number] DOB: [birth year] Today's Date: 08/16/2022   History of present illness 66 year old male presenting with acute metabolic encephalopathy in the setting of hypoglycemia and sepsis from suspected urinary source.  Also with positive COVID test.  PMH BPH; DM; HTN; PVD s/p L BKA; CAD s/p stent; HLD, hx of TIA, OSA who was recently discharged on 8/29 from [facility] for a NSTEMI and hypoglycemia.   OT comments  Patient received in bed and asking to use BSC. Patient able to get to EOB without assistance and was mod assist to transfer to [facility] with increased time due to RLE knee pain. Patient returned to EOB and completed grooming seated on EOB. Patient performed 2 stands from EOB tolerating 1.5 minutes of standing for each stand and stated he had decreased RLE knee pain. Patient continues to make gains with continued OT recommended to increase safety and independence with functional transfers and ADLs.    Recommendations for follow up therapy are one component of a multi-disciplinary discharge planning process, led by the attending physician.  Recommendations may be updated based on patient status, additional functional criteria and insurance authorization.    Follow Up Recommendations  Skilled nursing-short term rehab (<3 hours/day)    Assistance Recommended at Discharge Frequent or constant Supervision/Assistance  Patient can return home with the following  A little help with walking and/or transfers;A little help with bathing/dressing/bathroom;Assist for transportation   Equipment Recommendations  Other (comment) (defer to next venue)    Recommendations for Other Services      Precautions / Restrictions Precautions Precautions: Fall Precaution Comments: L BKA (prosthesis not here), off covid precautions 10/24, soft BP Restrictions Weight Bearing Restrictions: No       Mobility Bed Mobility Overal bed mobility: Needs  Assistance Bed Mobility: Supine to Sit, Sit to Supine     Supine to sit: Supervision Sit to supine: Supervision   General bed mobility comments: no assist for bed mobility    Transfers Overall transfer level: Needs assistance Equipment used: Rolling walker (2 wheels) Transfers: Sit to/from Stand, Bed to chair/wheelchair/BSC Sit to Stand: Mod assist     Step pivot transfers: Mod assist     General transfer comment: performed transfer to/from [facility] with mod assist using RW     Balance Overall balance assessment: Needs assistance Sitting-balance support: No upper extremity supported, Feet supported Sitting balance-Leahy Scale: Good     Standing balance support: Reliant on assistive device for balance, Bilateral upper extremity supported, During functional activity Standing balance-Leahy Scale: Poor Standing balance comment: stood for 90 seconds x2                           ADL either performed or assessed with clinical judgement   ADL Overall ADL's : Needs assistance/impaired     Grooming: Wash/dry hands;Wash/dry face;Oral care;Brushing hair;Set up;Sitting Grooming Details (indicate cue type and reason): on EOB                 Toilet Transfer: Moderate assistance;BSC/3in1;Rolling walker (2 wheels) Toilet Transfer Details (indicate cue type and reason): cues for safety Toileting- Clothing Manipulation and Hygiene: Maximal assistance;Sit to/from stand Toileting - Clothing Manipulation Details (indicate cue type and reason): required max assist while standing due to reliant on BUE support       General ADL Comments: Stated RLE pain decreased following transfer to/from [facility]    Extremity/Trunk Assessment Upper Extremity Assessment LUE Deficits /  Details: hx of rotator cuff injury AROM shoulder flexion 0-40 degrees approximately.  All other joints AROM WFLs with strength grossly 4/5 LUE Coordination: decreased gross motor            Vision        Perception     Praxis      Cognition Arousal/Alertness: Awake/alert Behavior During Therapy: WFL for tasks assessed/performed, Flat affect Overall Cognitive Status: Within Functional Limits for tasks assessed                                 General Comments: agreeable to therapy, followed directions well        Exercises      Shoulder Instructions       General Comments      Pertinent Vitals/ Pain       Pain Assessment Pain Assessment: Faces Faces Pain Scale: Hurts a little bit Pain Location: right knee Pain Descriptors / Indicators: Discomfort, Guarding Pain Intervention(s): Limited activity within patient's tolerance, Monitored during session, Repositioned  Home Living                                          Prior Functioning/Environment              Frequency  Min 2X/week        Progress Toward Goals  OT Goals(current goals can now be found in the care plan section)  Progress towards OT goals: Progressing toward goals  Acute Rehab OT Goals Patient Stated Goal: get better OT Goal Formulation: With patient Time For Goal Achievement: 08/28/22 Potential to Achieve Goals: Good ADL Goals Pt Will Perform Lower Body Bathing: with min assist;sit to/from stand Pt Will Perform Lower Body Dressing: with min assist;sit to/from stand Pt Will Transfer to Toilet: with min assist;bedside commode;stand pivot transfer Pt Will Perform Toileting - Clothing Manipulation and hygiene: with min assist;sit to/from stand Additional ADL Goal #1: Pt will demonstrate anticipatory awarness by stating balance deficits and need for assist prior to transfers with no more than min questioning cueing.  Plan Discharge plan remains appropriate    Co-evaluation                 AM-PAC OT "6 Clicks" Daily Activity     Outcome Measure   Help from another person eating meals?: None Help from another person taking care of personal grooming?: A  Little Help from another person toileting, which includes using toliet, bedpan, or urinal?: A Lot Help from another person bathing (including washing, rinsing, drying)?: A Lot Help from another person to put on and taking off regular upper body clothing?: A Lot Help from another person to put on and taking off regular lower body clothing?: A Lot 6 Click Score: 15    End of Session Equipment Utilized During Treatment: Rolling walker (2 wheels)  OT Visit Diagnosis: Muscle weakness (generalized) (M62.81);Unsteadiness on feet (R26.81);Other symptoms and signs involving cognitive function;Repeated falls (R29.6)   Activity Tolerance Patient tolerated treatment well   Patient Left in bed;with call bell/phone within reach;with bed alarm set;with nursing/sitter in room   Nurse Communication Mobility status        Time: 7342-8768 OT Time Calculation (min): 29 min  Charges: OT General Charges $OT Visit: 1 Visit OT Treatments $Self Care/Home Management : 23-37 mins  Liliane Channel  Roxy Manns, [place]  Office [place] 08/16/2022, 1:30 PM

## 2022-08-16 NOTE — Discharge Summary (Addendum)
[facility] Discharge Summary  Patient name: Peter Yang Medical record number: [id number] Date of birth: November 27, 1955 Age: 66 y.o. Gender: male Date of Admission: 08/02/2022  Date of Discharge: 08/16/22 Admitting Physician: Gerrit Heck, MD  Primary Care Provider: Clovia Cuff, MD Consultants: none  Indication for Hospitalization: AMS, hypoglycemia  Discharge Diagnoses/Problem List:  Principal Problem for Admission: Altered Mental Status, hypoglycemia Other Problems addressed during stay:  Active Problems:   History of urinary retention   UTI (urinary tract infection)   Hypoglycemia associated with diabetes (HCC)   Paroxysmal atrial fibrillation (HCC)   Hematuria   Heart failure with mid-range ejection fraction (HCC)   Pressure ulcer of sacral region, stage 2 (HCC)   BPH with obstruction/lower urinary tract symptoms   History of non-ST elevation myocardial infarction (NSTEMI)   PVD (peripheral vascular disease) ([place])   Ischemic cardiomyopathy   Need for assistance with personal care   Borderline low blood pressure determined by examination   Impaired functional mobility, balance, gait, and endurance   Moderate protein-calorie malnutrition (HCC)   Pleural effusion, bilateral   Catheter-associated urinary tract infection (HCC)   Lab test positive for detection of COVID-19 virus   Mitral valve regurgitation   Acute kidney injury ([place])   Type 2 diabetes mellitus with diabetic peripheral angiopathy without gangrene, without long-term current use of insulin [facility])   Brief Hospital Course:  Peter Yang is a 66 y.o.male with a history of CAD/NSTEMI, AAA, TIA, CHF, PAF on Eliquis, T2DM, HTN, HLD, recent admission with ESBL E. coli who was admitted to the family medicine teaching Service at [facility] for altered mental status. His hospital course is detailed below:  Acute metabolic encephalopathy/altered mental status Found down at home by EMS and  presented with altered mental status.  Ammonia, UDS, EtOH, imaging unremarkable.  Thought to be most related to either hypoglycemia or underlying infectious process.  His AMS improved by day 2 of admission with correction of his blood sugar and initiation of abx. Remained stable for rest of admission.  UTI Patient with significant history of ESBL E. coli UTI, has indwelling Foley catheter.  Febrile on admission with elevated WBC count.  Also in the setting of COVID-positive.  Was started on empiric antibiotics.  Urine culture and blood culture negative.  However, treatment with oral Bactrim was continued (total 6-week course) per recent admission.  Urinary retention Pt presented with indwelling Foley catheter from previous admission. It was noted that patient should f/u outpatient for voiding trial. Foley was discontinued during this admission due to patient preference and improvement in mental status. Pt was restarted on home finasteride, and also started on Flomax. Bladder scans did not show evidence of continued urinary retention. Pt should follow up outpatient w/ urology.   Hematuria Patient noted to have macroscopic hematuria from his indwelling Foley catheter.  This was also noted during his recent admission in [facility]. His hemoglobin remained stable during this admission.  Patient should follow-up outpatient with urology.  Hypoglycemia Held diabetes medications due to hypoglycemia and altered mental status on admission.  Started on D5 fluids with improvement in blood sugar levels. After normalization of blood sugar levels, patient was started on SSI. Pt was also started on metformin which was continued at discharge.  Elevated troponin Trop 435>532>273>326.  History of CAD and NSTEMI status post 2 stents.  EKG similar to prior. Patient remained asymptomatic for ACS. Continued ASA and Plavix (DAPT x1 year per recent hospitalization).  Paroxysmal atrial fibrillation  Beta-blocker held during  this admission due to soft blood pressures.  Continued Eliquis.  Hypotension Soft blood pressures during this admission.  Started on midodrine 10 mg 3 times daily during recent hospital admission.  Held beta-blocker and continued midodrine. Patient was stable and asymptomatic with blood pressures as low as 70s/40s - was able to participate in physical therapy and did not have any dizziness, syncope, or lightheadedness.  CHF Held ACE inhibitor, metoprolol, empagliflozin per recent hospitalization DC summary.  Positive COVID Indeterminant patchy infiltrates on chest CT. Overall asymptomatic  Peripheral vascular disease ABI performed with findings as below. RLE cold to touch on admission but improved during his stay. May consider vascular referral.   PCP Follow-up Recommendations: Ensure f/u with urology for urinary retention/hematuria and ID for ESBL UTI -- Foley dc'd with successful voiding trial during this admission Ensure cardiology follow-up for CHF and PAF F/u diabetes mgmt - initially held due to hypoglycemia during this admission - sent home with metformin Abnormal ABI of RLE. Consider vascular referral F/u blood pressure management - continued midodrine, remained asymptomatic with pressures in the 70s/40s   PERTINENT IMAGING  CXR IMPRESSION: Findings are most consistent with moderate pulmonary edema and small, left greater than right pleural effusions. Superimposed infection is possible.  CT HEAD WO CONTRAST IMPRESSION: 1. No acute abnormality. 2. Stable mild diffuse cerebral and cerebellar atrophy. 3. Stable mild chronic small vessel white matter ischemic changes in both cerebral hemispheres. 4. 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 5. Interval mild sphenoid sinusitis.  CT CHEST IMPRESSION: 1. Moderate-sized bilateral pleural effusions with an interval increase in size. 2. Increased patchy alveolar and interstitial opacity in the dependent portions  of both lungs, compatible with pulmonary edema with associated biatrial enlargement. 3. Cholelithiasis. 4. Diffuse pancreatic atrophy compatible with the history of diabetes. 5. Dense calcific coronary artery and aortic atherosclerosis.   Aortic Atherosclerosis (ICD10-I70.0).   VAS [place] ABI Summary:  Right: Resting right ankle-brachial index indicates noncompressible right  lower extremity arteries. The right toe-brachial index is abnormal.  Today's examination was limited due to patient movement/altered mental  status.   Left:   S/p left BKA.   Disposition: SNF  Discharge Condition: stable, improved    Discharge Exam:  Vitals:   08/16/22 0824 08/16/22 1235  BP: (!) 88/51 (!) 89/49  Pulse: 86 85  Resp: 18 18  Temp: 98 F (36.7 C) 98 F (36.7 C)  SpO2: 100% 100%   Gen: NAD, alert and responsive CV: RRR no MRG Pulm: CTAB normal WOB on RA Abd: Soft NT/ND Ext: LLE s/p BKA, RLE normal temperature with normal ROM and sensation Psych: Flat affect    Significant Labs and Imaging:   CBC    Component Value Date/Time   WBC 7.1 08/11/2022 0229   RBC 3.49 (L) 08/11/2022 0229   HGB 10.3 (L) 08/11/2022 0229   HCT 30.6 (L) 08/11/2022 0229   PLT 230 08/11/2022 0229   MCV 87.7 08/11/2022 0229   MCH 29.5 08/11/2022 0229   MCHC 33.7 08/11/2022 0229   RDW 15.3 08/11/2022 0229   LYMPHSABS 1.8 08/02/2022 1436   MONOABS 0.8 08/02/2022 1436   EOSABS 0.0 08/02/2022 1436   BASOSABS 0.1 08/02/2022 1436      Latest Ref Rng & Units 08/12/2022    9:08 PM 08/12/2022    4:21 AM 08/11/2022    2:29 AM  CMP  Glucose 70 - 99 mg/dL 232  215  169   BUN 8 - 23  mg/dL '20  21  19   '$ Creatinine 0.61 - 1.24 mg/dL 1.27  1.30  1.23   Sodium 135 - 145 mmol/L 131  132  132   Potassium 3.5 - 5.1 mmol/L 4.6  4.5  4.5   Chloride 98 - 111 mmol/L 101  104  105   CO2 22 - 32 mmol/L '21  18  23   '$ Calcium 8.9 - 10.3 mg/dL 8.5  8.4  8.6    CBG (last 3)  Recent Labs    08/15/22 2133  08/16/22 0826 08/16/22 1239  GLUCAP 148* 159* 126*     Results/Tests Pending at Time of Discharge: n/a  Discharge Medications:  Allergies as of 08/16/2022   No Known Allergies      Medication List     STOP taking these medications    insulin glargine 100 UNIT/ML injection Commonly known as: LANTUS   insulin lispro 100 UNIT/ML injection Commonly known as: HUMALOG   Tresiba FlexTouch 100 UNIT/ML FlexTouch Pen Generic drug: insulin degludec   zolpidem 5 MG tablet Commonly known as: AMBIEN       TAKE these medications    acetaminophen 325 MG tablet Commonly known as: TYLENOL Take 2 tablets (650 mg total) by mouth every 6 (six) hours as needed for mild pain (or Fever >/= 101).   apixaban 5 MG Tabs tablet Commonly known as: ELIQUIS Take 1 tablet (5 mg total) by mouth 2 (two) times daily.   aspirin 81 MG chewable tablet Chew 1 tablet (81 mg total) by mouth daily.   atorvastatin 80 MG tablet Commonly known as: LIPITOR Take 80 mg by mouth daily.   clopidogrel 75 MG tablet Commonly known as: PLAVIX Take 1 tablet (75 mg total) by mouth daily.   diclofenac Sodium 1 % Gel Commonly known as: VOLTAREN Apply 2 g topically 2 (two) times daily as needed (pain).   finasteride 5 MG tablet Commonly known as: PROSCAR Take 1 tablet (5 mg total) by mouth daily.   gabapentin 300 MG capsule Commonly known as: NEURONTIN Take 300 mg by mouth at bedtime.   Jardiance 10 MG Tabs tablet Generic drug: empagliflozin Take 10 mg by mouth daily at 12 noon.   Magnesium 500 MG Tabs Take 500 mg by mouth at bedtime.   metFORMIN 500 MG tablet Commonly known as: GLUCOPHAGE Take 1,000 mg by mouth 2 (two) times daily with a meal.   midodrine 10 MG tablet Commonly known as: PROAMATINE Take 10 mg by mouth 3 (three) times daily.   pantoprazole 40 MG tablet Commonly known as: PROTONIX Take 1 tablet (40 mg total) by mouth daily.   potassium chloride SA 20 MEQ tablet Commonly  known as: KLOR-CON M Take 20 mEq by mouth daily.   Pro-Stat AWC Liqd Take 30 mLs by mouth 2 (two) times daily.   saccharomyces boulardii 250 MG capsule Commonly known as: FLORASTOR Take 250 mg by mouth 2 (two) times daily.   sulfamethoxazole-trimethoprim 800-160 MG tablet Commonly known as: BACTRIM DS Take 1 tablet by mouth 2 (two) times daily for 18 days.   Systane Balance 0.6 % Soln Generic drug: Propylene Glycol Place 1 drop into both eyes daily as needed.   tamsulosin 0.4 MG Caps capsule Commonly known as: FLOMAX Take 0.4 mg by mouth daily.        Discharge Instructions: Please refer to Patient Instructions section of EMR for full details.  Patient was counseled important signs and symptoms that should prompt return to medical care,  changes in medications, dietary instructions, activity restrictions, and follow up appointments.   Follow-Up Appointments:  Contact information for after-discharge care     Destination     [facility] .   Service: Skilled Nursing Contact information: [address] [place], Atif, MD 08/16/2022, 1:11 PM PGY-1, [facility]  Upper Level Addendum:  I have seen and evaluated this patient along with Dr. Joeseph Amor and reviewed the above note, making necessary revisions as appropriate.  I agree with the medical decision making and physical exam as noted above.  Gerrit Heck, MD PGY-2 [facility]

## 2022-08-16 NOTE — Progress Notes (Signed)
Attempted report to [facility] x 2 times, was hung up on twice. Called [phone].

## 2022-08-16 NOTE — TOC Transition Note (Signed)
Transition of Care [facility]) - CM/SW Discharge Note   Patient Details  Name: Peter Yang MRN: [id number] Date of Birth: [birth year]  Transition of Care [facility]) CM/SW Contact:  Benard Halsted, [place] Phone Number: 08/16/2022, 2:05 PM   Clinical Narrative:    Patient will DC to: [facility] Anticipated DC date: 08/16/22 Family notified: Pt declined Transport by: Corey Harold   Per MD patient ready for DC to [facility]. RN to call report prior to discharge [phone]). RN, patient, patient's family, and facility notified of DC. Discharge Summary and FL2 sent to facility. DC packet on chart with signed DNR. Ambulance transport requested for patient.   CSW will sign off for now as social work intervention is no longer needed. Please consult [place] again if new needs arise.     Final next level of care: Skilled Nursing Facility Barriers to Discharge: Barriers Resolved   Patient Goals and CMS Choice Patient states their goals for this hospitalization and ongoing recovery are:: Get stronger CMS Medicare.gov Compare Post Acute Care list provided to:: Patient Choice offered to / list presented to : Patient  Discharge Placement   Existing PASRR number confirmed : 08/16/22          Patient chooses bed at:  [facility]) Patient to be transferred to facility by: [place] Name of family member notified: None Patient and family notified of of transfer: 08/16/22  Discharge Plan and Services In-house Referral: Clinical Social Work   Post Acute Care Choice: [facility]                               Social Determinants of Health (SDOH) Interventions     Readmission Risk Interventions     No data to display

## 2022-08-16 NOTE — Progress Notes (Signed)
[facility] for the 3rd time, no answer at all this time.

## 2022-08-31 ENCOUNTER — Emergency Department (HOSPITAL_COMMUNITY): Payer: BC Managed Care – PPO

## 2022-08-31 ENCOUNTER — Inpatient Hospital Stay (HOSPITAL_COMMUNITY)
Admission: EM | Admit: 2022-08-31 | Discharge: 2022-10-24 | DRG: 695 | Disposition: A | Discharge status: Skilled Nursing Facility | Payer: BC Managed Care – PPO | Source: Skilled Nursing Facility | Attending: Internal Medicine | Admitting: Internal Medicine

## 2022-08-31 ENCOUNTER — Encounter (HOSPITAL_COMMUNITY): Payer: Self-pay

## 2022-08-31 DIAGNOSIS — E1142 Type 2 diabetes mellitus with diabetic polyneuropathy: Secondary | ICD-10-CM | POA: Diagnosis present

## 2022-08-31 DIAGNOSIS — I952 Hypotension due to drugs: Secondary | ICD-10-CM | POA: Diagnosis not present

## 2022-08-31 DIAGNOSIS — R188 Other ascites: Secondary | ICD-10-CM | POA: Diagnosis present

## 2022-08-31 DIAGNOSIS — R52 Pain, unspecified: Secondary | ICD-10-CM

## 2022-08-31 DIAGNOSIS — R627 Adult failure to thrive: Secondary | ICD-10-CM | POA: Diagnosis not present

## 2022-08-31 DIAGNOSIS — B964 Proteus (mirabilis) (morganii) as the cause of diseases classified elsewhere: Secondary | ICD-10-CM | POA: Diagnosis present

## 2022-08-31 DIAGNOSIS — Z515 Encounter for palliative care: Secondary | ICD-10-CM

## 2022-08-31 DIAGNOSIS — Z89512 Acquired absence of left leg below knee: Secondary | ICD-10-CM

## 2022-08-31 DIAGNOSIS — Z794 Long term (current) use of insulin: Secondary | ICD-10-CM

## 2022-08-31 DIAGNOSIS — E872 Acidosis, unspecified: Secondary | ICD-10-CM | POA: Diagnosis present

## 2022-08-31 DIAGNOSIS — N179 Acute kidney failure, unspecified: Secondary | ICD-10-CM | POA: Diagnosis present

## 2022-08-31 DIAGNOSIS — R031 Nonspecific low blood-pressure reading: Secondary | ICD-10-CM | POA: Diagnosis present

## 2022-08-31 DIAGNOSIS — I5022 Chronic systolic (congestive) heart failure: Secondary | ICD-10-CM | POA: Diagnosis not present

## 2022-08-31 DIAGNOSIS — D689 Coagulation defect, unspecified: Secondary | ICD-10-CM

## 2022-08-31 DIAGNOSIS — E861 Hypovolemia: Secondary | ICD-10-CM | POA: Diagnosis not present

## 2022-08-31 DIAGNOSIS — I493 Ventricular premature depolarization: Secondary | ICD-10-CM | POA: Diagnosis present

## 2022-08-31 DIAGNOSIS — E43 Unspecified severe protein-calorie malnutrition: Secondary | ICD-10-CM | POA: Diagnosis not present

## 2022-08-31 DIAGNOSIS — R310 Gross hematuria: Principal | ICD-10-CM | POA: Diagnosis present

## 2022-08-31 DIAGNOSIS — R5381 Other malaise: Secondary | ICD-10-CM

## 2022-08-31 DIAGNOSIS — F4541 Pain disorder exclusively related to psychological factors: Secondary | ICD-10-CM | POA: Diagnosis not present

## 2022-08-31 DIAGNOSIS — D6959 Other secondary thrombocytopenia: Secondary | ICD-10-CM | POA: Diagnosis present

## 2022-08-31 DIAGNOSIS — N390 Urinary tract infection, site not specified: Secondary | ICD-10-CM | POA: Diagnosis present

## 2022-08-31 DIAGNOSIS — I5020 Unspecified systolic (congestive) heart failure: Secondary | ICD-10-CM

## 2022-08-31 DIAGNOSIS — Z1623 Resistance to quinolones and fluoroquinolones: Secondary | ICD-10-CM | POA: Diagnosis present

## 2022-08-31 DIAGNOSIS — N1330 Unspecified hydronephrosis: Secondary | ICD-10-CM

## 2022-08-31 DIAGNOSIS — Z1612 Extended spectrum beta lactamase (ESBL) resistance: Secondary | ICD-10-CM | POA: Diagnosis present

## 2022-08-31 DIAGNOSIS — N492 Inflammatory disorders of scrotum: Secondary | ICD-10-CM | POA: Diagnosis present

## 2022-08-31 DIAGNOSIS — T45515A Adverse effect of anticoagulants, initial encounter: Secondary | ICD-10-CM | POA: Diagnosis present

## 2022-08-31 DIAGNOSIS — Z947 Corneal transplant status: Secondary | ICD-10-CM

## 2022-08-31 DIAGNOSIS — D696 Thrombocytopenia, unspecified: Secondary | ICD-10-CM

## 2022-08-31 DIAGNOSIS — I714 Abdominal aortic aneurysm, without rupture, unspecified: Secondary | ICD-10-CM | POA: Diagnosis present

## 2022-08-31 DIAGNOSIS — R570 Cardiogenic shock: Secondary | ICD-10-CM

## 2022-08-31 DIAGNOSIS — D509 Iron deficiency anemia, unspecified: Secondary | ICD-10-CM | POA: Diagnosis present

## 2022-08-31 DIAGNOSIS — D539 Nutritional anemia, unspecified: Secondary | ICD-10-CM | POA: Diagnosis present

## 2022-08-31 DIAGNOSIS — L89152 Pressure ulcer of sacral region, stage 2: Secondary | ICD-10-CM | POA: Diagnosis present

## 2022-08-31 DIAGNOSIS — D65 Disseminated intravascular coagulation [defibrination syndrome]: Secondary | ICD-10-CM | POA: Diagnosis not present

## 2022-08-31 DIAGNOSIS — I5043 Acute on chronic combined systolic (congestive) and diastolic (congestive) heart failure: Secondary | ICD-10-CM | POA: Diagnosis not present

## 2022-08-31 DIAGNOSIS — I5023 Acute on chronic systolic (congestive) heart failure: Secondary | ICD-10-CM

## 2022-08-31 DIAGNOSIS — Z8249 Family history of ischemic heart disease and other diseases of the circulatory system: Secondary | ICD-10-CM

## 2022-08-31 DIAGNOSIS — Z936 Other artificial openings of urinary tract status: Secondary | ICD-10-CM

## 2022-08-31 DIAGNOSIS — E871 Hypo-osmolality and hyponatremia: Secondary | ICD-10-CM

## 2022-08-31 DIAGNOSIS — E1151 Type 2 diabetes mellitus with diabetic peripheral angiopathy without gangrene: Secondary | ICD-10-CM | POA: Diagnosis present

## 2022-08-31 DIAGNOSIS — N401 Enlarged prostate with lower urinary tract symptoms: Secondary | ICD-10-CM | POA: Diagnosis present

## 2022-08-31 DIAGNOSIS — E876 Hypokalemia: Secondary | ICD-10-CM | POA: Diagnosis not present

## 2022-08-31 DIAGNOSIS — Z955 Presence of coronary angioplasty implant and graft: Secondary | ICD-10-CM

## 2022-08-31 DIAGNOSIS — I2510 Atherosclerotic heart disease of native coronary artery without angina pectoris: Secondary | ICD-10-CM | POA: Diagnosis present

## 2022-08-31 DIAGNOSIS — B9620 Unspecified Escherichia coli [E. coli] as the cause of diseases classified elsewhere: Secondary | ICD-10-CM | POA: Diagnosis present

## 2022-08-31 DIAGNOSIS — T43215A Adverse effect of selective serotonin and norepinephrine reuptake inhibitors, initial encounter: Secondary | ICD-10-CM | POA: Diagnosis not present

## 2022-08-31 DIAGNOSIS — I2583 Coronary atherosclerosis due to lipid rich plaque: Secondary | ICD-10-CM | POA: Diagnosis present

## 2022-08-31 DIAGNOSIS — R571 Hypovolemic shock: Secondary | ICD-10-CM | POA: Diagnosis not present

## 2022-08-31 DIAGNOSIS — Z608 Other problems related to social environment: Secondary | ICD-10-CM | POA: Diagnosis present

## 2022-08-31 DIAGNOSIS — E1165 Type 2 diabetes mellitus with hyperglycemia: Secondary | ICD-10-CM | POA: Diagnosis present

## 2022-08-31 DIAGNOSIS — Z1613 Resistance to carbapenem: Secondary | ICD-10-CM | POA: Diagnosis present

## 2022-08-31 DIAGNOSIS — T502X5A Adverse effect of carbonic-anhydrase inhibitors, benzothiadiazides and other diuretics, initial encounter: Secondary | ICD-10-CM | POA: Diagnosis not present

## 2022-08-31 DIAGNOSIS — Z8616 Personal history of COVID-19: Secondary | ICD-10-CM

## 2022-08-31 DIAGNOSIS — Z6826 Body mass index (BMI) 26.0-26.9, adult: Secondary | ICD-10-CM

## 2022-08-31 DIAGNOSIS — R4586 Emotional lability: Secondary | ICD-10-CM

## 2022-08-31 DIAGNOSIS — I480 Paroxysmal atrial fibrillation: Secondary | ICD-10-CM | POA: Diagnosis present

## 2022-08-31 DIAGNOSIS — Z7984 Long term (current) use of oral hypoglycemic drugs: Secondary | ICD-10-CM

## 2022-08-31 DIAGNOSIS — Z7902 Long term (current) use of antithrombotics/antiplatelets: Secondary | ICD-10-CM

## 2022-08-31 DIAGNOSIS — D6832 Hemorrhagic disorder due to extrinsic circulating anticoagulants: Secondary | ICD-10-CM | POA: Diagnosis present

## 2022-08-31 DIAGNOSIS — N410 Acute prostatitis: Secondary | ICD-10-CM

## 2022-08-31 DIAGNOSIS — Z833 Family history of diabetes mellitus: Secondary | ICD-10-CM

## 2022-08-31 DIAGNOSIS — Z66 Do not resuscitate: Secondary | ICD-10-CM | POA: Diagnosis present

## 2022-08-31 DIAGNOSIS — I7121 Aneurysm of the ascending aorta, without rupture: Secondary | ICD-10-CM | POA: Diagnosis present

## 2022-08-31 DIAGNOSIS — F32A Depression, unspecified: Secondary | ICD-10-CM | POA: Diagnosis not present

## 2022-08-31 DIAGNOSIS — E875 Hyperkalemia: Secondary | ICD-10-CM | POA: Diagnosis present

## 2022-08-31 DIAGNOSIS — Z8546 Personal history of malignant neoplasm of prostate: Secondary | ICD-10-CM

## 2022-08-31 DIAGNOSIS — R4589 Other symptoms and signs involving emotional state: Secondary | ICD-10-CM

## 2022-08-31 DIAGNOSIS — Z7901 Long term (current) use of anticoagulants: Secondary | ICD-10-CM

## 2022-08-31 DIAGNOSIS — E669 Obesity, unspecified: Secondary | ICD-10-CM | POA: Diagnosis present

## 2022-08-31 DIAGNOSIS — A419 Sepsis, unspecified organism: Secondary | ICD-10-CM

## 2022-08-31 DIAGNOSIS — Z79899 Other long term (current) drug therapy: Secondary | ICD-10-CM

## 2022-08-31 DIAGNOSIS — Z8673 Personal history of transient ischemic attack (TIA), and cerebral infarction without residual deficits: Secondary | ICD-10-CM

## 2022-08-31 DIAGNOSIS — Z7982 Long term (current) use of aspirin: Secondary | ICD-10-CM

## 2022-08-31 DIAGNOSIS — I110 Hypertensive heart disease with heart failure: Secondary | ICD-10-CM | POA: Diagnosis present

## 2022-08-31 DIAGNOSIS — N5089 Other specified disorders of the male genital organs: Secondary | ICD-10-CM | POA: Diagnosis present

## 2022-08-31 DIAGNOSIS — I255 Ischemic cardiomyopathy: Secondary | ICD-10-CM | POA: Diagnosis present

## 2022-08-31 DIAGNOSIS — D638 Anemia in other chronic diseases classified elsewhere: Secondary | ICD-10-CM | POA: Diagnosis present

## 2022-08-31 DIAGNOSIS — N138 Other obstructive and reflux uropathy: Secondary | ICD-10-CM | POA: Diagnosis present

## 2022-08-31 DIAGNOSIS — K5900 Constipation, unspecified: Secondary | ICD-10-CM | POA: Diagnosis not present

## 2022-08-31 DIAGNOSIS — J984 Other disorders of lung: Secondary | ICD-10-CM | POA: Diagnosis present

## 2022-08-31 DIAGNOSIS — G4700 Insomnia, unspecified: Secondary | ICD-10-CM | POA: Diagnosis not present

## 2022-08-31 DIAGNOSIS — G4733 Obstructive sleep apnea (adult) (pediatric): Secondary | ICD-10-CM | POA: Diagnosis present

## 2022-08-31 DIAGNOSIS — I951 Orthostatic hypotension: Secondary | ICD-10-CM

## 2022-08-31 DIAGNOSIS — I252 Old myocardial infarction: Secondary | ICD-10-CM

## 2022-08-31 DIAGNOSIS — G546 Phantom limb syndrome with pain: Secondary | ICD-10-CM | POA: Diagnosis not present

## 2022-08-31 DIAGNOSIS — E785 Hyperlipidemia, unspecified: Secondary | ICD-10-CM | POA: Diagnosis present

## 2022-08-31 DIAGNOSIS — Z7189 Other specified counseling: Secondary | ICD-10-CM

## 2022-08-31 LAB — URINALYSIS, ROUTINE W REFLEX MICROSCOPIC

## 2022-08-31 LAB — URINALYSIS, MICROSCOPIC (REFLEX)
RBC / HPF: >50 RBC/hpf (ref 0–5)
WBC, UA: >50 WBC/hpf (ref 0–5)

## 2022-08-31 LAB — PROTIME-INR
INR: 1.7 — ABNORMAL HIGH (ref 0.8–1.2)
Prothrombin Time: 20 seconds — ABNORMAL HIGH (ref 11.4–15.2)

## 2022-08-31 LAB — COMPREHENSIVE METABOLIC PANEL
ALT: 15 U/L (ref 0–44)
AST: 23 U/L (ref 15–41)
Albumin: 3.7 g/dL (ref 3.5–5.0)
Alkaline Phosphatase: 104 U/L (ref 38–126)
Anion gap: 12 (ref 5–15)
BUN: 45 mg/dL — ABNORMAL HIGH (ref 8–23)
CO2: 18 mmol/L — ABNORMAL LOW (ref 22–32)
Calcium: 9.1 mg/dL (ref 8.9–10.3)
Chloride: 104 mmol/L (ref 98–111)
Creatinine, Ser: 2.07 mg/dL — ABNORMAL HIGH (ref 0.61–1.24)
GFR, Estimated: 35 mL/min — ABNORMAL LOW (ref >60)
Glucose, Bld: 209 mg/dL — ABNORMAL HIGH (ref 70–99)
Potassium: 5.2 mmol/L — ABNORMAL HIGH (ref 3.5–5.1)
Sodium: 134 mmol/L — ABNORMAL LOW (ref 135–145)
Total Bilirubin: 1 mg/dL (ref 0.3–1.2)
Total Protein: 7.9 g/dL (ref 6.5–8.1)

## 2022-08-31 LAB — CBC WITH DIFFERENTIAL/PLATELET
Abs Immature Granulocytes: 0.07 10*3/uL (ref 0.00–0.07)
Basophils Absolute: 0.1 10*3/uL (ref 0.0–0.1)
Basophils Relative: 1 %
Eosinophils Absolute: 0.1 10*3/uL (ref 0.0–0.5)
Eosinophils Relative: 1 %
HCT: 31.7 % — ABNORMAL LOW (ref 39.0–52.0)
Hemoglobin: 9.5 g/dL — ABNORMAL LOW (ref 13.0–17.0)
Immature Granulocytes: 1 %
Lymphocytes Relative: 6 %
Lymphs Abs: 0.9 10*3/uL (ref 0.7–4.0)
MCH: 26.5 pg (ref 26.0–34.0)
MCHC: 30 g/dL (ref 30.0–36.0)
MCV: 88.5 fL (ref 80.0–100.0)
Monocytes Absolute: 1.4 10*3/uL — ABNORMAL HIGH (ref 0.1–1.0)
Monocytes Relative: 10 %
Neutro Abs: 11.9 10*3/uL — ABNORMAL HIGH (ref 1.7–7.7)
Neutrophils Relative %: 81 %
Platelets: 287 10*3/uL (ref 150–400)
RBC: 3.58 MIL/uL — ABNORMAL LOW (ref 4.22–5.81)
RDW: 15.8 % — ABNORMAL HIGH (ref 11.5–15.5)
WBC: 14.4 10*3/uL — ABNORMAL HIGH (ref 4.0–10.5)
nRBC: 0 % (ref 0.0–0.2)

## 2022-08-31 LAB — LACTIC ACID, PLASMA
Lactic Acid, Venous: 5 mmol/L (ref 0.5–1.9)
Lactic Acid, Venous: 7.9 mmol/L (ref 0.5–1.9)

## 2022-08-31 LAB — APTT: aPTT: 37 seconds — ABNORMAL HIGH (ref 24–36)

## 2022-08-31 LAB — CBG MONITORING, ED: Glucose-Capillary: 246 mg/dL — ABNORMAL HIGH (ref 70–99)

## 2022-08-31 MED ORDER — LACTATED RINGERS IV BOLUS
1000.0000 mL | Freq: Once | INTRAVENOUS | Status: AC
  Administered 2022-08-31: 1000 mL via INTRAVENOUS

## 2022-08-31 MED ORDER — AMOXICILLIN 500 MG PO CAPS
500.0000 mg | ORAL_CAPSULE | Freq: Three times a day (TID) | ORAL | Status: DC
  Administered 2022-08-31 – 2022-09-03 (×9): 500 mg via ORAL
  Filled 2022-08-31 (×9): qty 1

## 2022-08-31 MED ORDER — ACETAMINOPHEN 325 MG PO TABS
650.0000 mg | ORAL_TABLET | Freq: Four times a day (QID) | ORAL | Status: DC | PRN
  Administered 2022-08-31 – 2022-09-12 (×12): 650 mg via ORAL
  Filled 2022-08-31 (×11): qty 2

## 2022-08-31 MED ORDER — LIDOCAINE HCL URETHRAL/MUCOSAL 2 % EX GEL
1.0000 | Freq: Once | CUTANEOUS | Status: AC
  Administered 2022-08-31: 1 via URETHRAL
  Filled 2022-08-31: qty 11

## 2022-08-31 MED ORDER — MIDODRINE HCL 5 MG PO TABS
10.0000 mg | ORAL_TABLET | Freq: Three times a day (TID) | ORAL | Status: DC
  Administered 2022-09-01 – 2022-09-11 (×31): 10 mg via ORAL
  Filled 2022-08-31 (×32): qty 2

## 2022-08-31 MED ORDER — SODIUM CHLORIDE 0.9 % IV SOLN
1.0000 g | Freq: Three times a day (TID) | INTRAVENOUS | Status: DC
  Filled 2022-08-31: qty 20

## 2022-08-31 MED ORDER — SODIUM CHLORIDE 0.9 % IV BOLUS
1000.0000 mL | Freq: Once | INTRAVENOUS | Status: AC
  Administered 2022-08-31: 1000 mL via INTRAVENOUS

## 2022-08-31 MED ORDER — ACETAMINOPHEN 650 MG RE SUPP: 650.0000 mg | Freq: Four times a day (QID) | RECTAL | Status: DC | PRN

## 2022-08-31 MED ORDER — LACTATED RINGERS IV BOLUS (SEPSIS)
1000.0000 mL | Freq: Once | INTRAVENOUS | Status: AC
  Administered 2022-08-31: 1000 mL via INTRAVENOUS

## 2022-08-31 MED ORDER — FINASTERIDE 5 MG PO TABS
5.0000 mg | ORAL_TABLET | Freq: Every day | ORAL | Status: DC
  Administered 2022-08-31 – 2022-10-24 (×55): 5 mg via ORAL
  Filled 2022-08-31 (×56): qty 1

## 2022-08-31 MED ORDER — LACTATED RINGERS IV SOLN: INTRAVENOUS | Status: DC

## 2022-08-31 MED ORDER — SODIUM CHLORIDE 0.9 % IV SOLN
1.0000 g | Freq: Once | INTRAVENOUS | Status: AC
  Administered 2022-08-31: 1 g via INTRAVENOUS
  Filled 2022-08-31: qty 20

## 2022-08-31 MED ORDER — INSULIN ASPART 100 UNIT/ML IJ SOLN
0.0000 [IU] | INTRAMUSCULAR | Status: DC
  Administered 2022-08-31: 3 [IU] via SUBCUTANEOUS
  Administered 2022-09-01 (×2): 5 [IU] via SUBCUTANEOUS
  Administered 2022-09-01: 3 [IU] via SUBCUTANEOUS
  Administered 2022-09-01: 2 [IU] via SUBCUTANEOUS
  Administered 2022-09-01: 3 [IU] via SUBCUTANEOUS
  Administered 2022-09-01: 5 [IU] via SUBCUTANEOUS
  Administered 2022-09-02 (×2): 3 [IU] via SUBCUTANEOUS
  Administered 2022-09-02: 2 [IU] via SUBCUTANEOUS
  Administered 2022-09-02 (×2): 3 [IU] via SUBCUTANEOUS
  Administered 2022-09-02: 5 [IU] via SUBCUTANEOUS
  Filled 2022-08-31: qty 0.09

## 2022-08-31 MED ORDER — SODIUM CHLORIDE 0.9 % IV SOLN
1.0000 g | Freq: Once | INTRAVENOUS | Status: AC
  Administered 2022-08-31: 1 g via INTRAVENOUS
  Filled 2022-08-31: qty 10

## 2022-08-31 MED ORDER — TAMSULOSIN HCL 0.4 MG PO CAPS
0.4000 mg | ORAL_CAPSULE | Freq: Every day | ORAL | Status: DC
  Administered 2022-09-01 – 2022-10-24 (×54): 0.4 mg via ORAL
  Filled 2022-08-31 (×54): qty 1

## 2022-08-31 MED ORDER — SULFAMETHOXAZOLE-TRIMETHOPRIM 800-160 MG PO TABS: 1.0000 | ORAL_TABLET | Freq: Two times a day (BID) | ORAL | Status: DC

## 2022-08-31 NOTE — Assessment & Plan Note (Addendum)
Repeating lactate to see if this cleared after IVF Got 2L IVF bolus in ED Cont IVF at 125 cc/hr

## 2022-08-31 NOTE — Assessment & Plan Note (Addendum)
Pt with low BPs during recent admit to [facility], got started on midodrine that admit. Continue home midodrine for now.

## 2022-08-31 NOTE — Assessment & Plan Note (Signed)
Continue bactrim DS for 3 more days for ESBL E.Coli + carbapenem resistant proteus UTI (completing 6 week course) If pt gets septic / febrile / lactic acid doesn't improve post IVF: DC bactrim Put pt back on carbapenem + amoxicllin (proteus was apparently sensitive to amoxicllin on cultures, see HPI above).

## 2022-08-31 NOTE — Consult Note (Signed)
H&P Physician requesting consult: Shirlyn Goltz  Chief Complaint: gross hematuria  History of Present Illness: 66 year old male with a history of type 2 diabetes, BPH, hypertension, history of NSTEMI and PAF on Eliquis, ICM, CHF, thoracic ascending aortic aneurysm, peripheral vascular disease presented with gross hematuria.  He was recently admitted from 45 13-10 17.  Had a possible catheter associated UTI from an indwelling Foley catheter from prior admission.  It was subsequent discontinued that admission and he was started on Flomax and finasteride.  He did have some gross hematuria on that admission and was instructed to follow-up outpatient.  Patient states that he has been having some intermittent gross hematuria.  Tends to clear up throughout the day.  Was sent to the hospital by rehab facility due to his gross hematuria.  Patient was noted to have a lactate of 5.0.And leukocytosis of 14.4.  He does have some tachycardia and hypotension.  Hemoglobin noted to be 9.5.  It was 10.3 on 10/22 but also 9.7 on 10/21.  Had a little bit elevated INR of 1.7.  Patient's only complaint to me was gross hematuria.  He now has a Foley catheter in place.  Emergency department was reportedly not able to get a 42 [place] three-way catheter in for irrigation.  He has a 20 [place] coud catheter however.  This was able to be placed without issue.  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   Coronary artery disease involving native coronary artery of native heart without angina pectoris 06/25/2022   Diabetic polyneuropathy associated with type 2 diabetes mellitus ([place]) 07/28/2021   Essential hypertension 01/25/2016   Hematuria and +fecal occult  06/25/2022   History of diabetic ulcer of foot 12/31/2019   History of non-ST elevation myocardial infarction (NSTEMI) 06/15/2022   Hx  of right coronary artery stent placement 02/11/2022   Hyperlipidemia    Ischemic cardiomyopathy    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 Overview:  Added automatically from request for surgery [id number] Formatting of this note might be different from the original. Added automatically from request for surgery [id number]   Large bowel perforation ([place]) 09/27/2015   Malignant neoplasm of prostate ([place]) 10/22/2019   Myopia with astigmatism and presbyopia, bilateral 06/15/2018   Last Assessment & Plan:  Formatting of this note might be different from the original. - Wrx printed Formatting of this note might be different from the original. 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 - Stable, monitor Formatting of this note might be different from the original. Last Assessment & Plan:  - Stable, monitor   Retroperitoneal abscess ([place]) 10/02/2015   Status post corneal transplant 10/07/2016   Formatting of this note might be different from the original. Overview:  Added automatically from request for surgery [id number] Formatting of this note might be different from the original. Added automatically from request for surgery [id number]  Last Assessment & Plan:  Formatting of this note might be different from  the original. - OD, 2/2 KCN - Stable, no NVK / rejection - Continue off steroids - Mo   Thoracic ascending aortic aneurysm (HCC)    TIA (transient ischemic attack) 2012   Type 2 diabetes mellitus ([place])    Past Surgical History:  Procedure Laterality Date   BELOW KNEE LEG AMPUTATION Left 2022   CORONARY ANGIOPLASTY WITH STENT  PLACEMENT  01/2022   DES RCA   LEFT HEART CATH AND CORONARY ANGIOGRAPHY N/A 06/17/2022   Procedure: LEFT HEART CATH AND CORONARY ANGIOGRAPHY;  Surgeon: Nelva Bush, MD;  Location: [facility];  Service: Cardiovascular;  Laterality: N/A;   ROTATOR CUFF REPAIR Left     Home Medications:  (Not in a hospital admission)  Allergies: No Known Allergies  Family History  Problem Relation Age of Onset   Diabetes Mother    Hypertension Mother    Heart disease Mother    Cancer Father    Heart attack Sister    Cancer Sister    Diabetes Brother    Social History:  reports that he has never smoked. He has never used smokeless tobacco. He reports that he does not drink alcohol and does not use drugs.  ROS: A complete review of systems was performed.  All systems are negative except for pertinent findings as noted. ROS   Physical Exam:  Vital signs in last 24 hours: Temp:  [97.4 F (36.3 C)-97.9 F (36.6 C)] 97.4 F (36.3 C) (11/11 1506) Pulse Rate:  [101-109] 107 (11/11 1747) Resp:  [14-17] 17 (11/11 1747) BP: (83-113)/(62-68) 113/62 (11/11 1747) SpO2:  [97 %-100 %] 97 % (11/11 1747) General:  Alert and oriented, No acute distress HEENT: Normocephalic, atraumatic Neck: No JVD or lymphadenopathy Cardiovascular: Regular rate and rhythm Lungs: Regular rate and effort Abdomen: Soft, nontender, nondistended, no abdominal masses Back: No CVA tenderness Extremities: No edema Genitourinary: Circumcised phallus.  Bilateral testicles descended without masses. Neurologic: Grossly intact  Laboratory Data:  Results for orders placed or performed during the hospital encounter of 08/31/22 (from the past 24 hour(s))  CBC with Differential     Status: Abnormal   Collection Time: 08/31/22  3:10 PM  Result Value Ref Range   WBC 14.4 (H) 4.0 - 10.5 K/uL   RBC 3.58 (L) 4.22 - 5.81 MIL/uL   Hemoglobin 9.5 (L) 13.0 - 17.0 g/dL   HCT 31.7 (L) 39.0 - 52.0 %   MCV 88.5 80.0 - 100.0 fL    MCH 26.5 26.0 - 34.0 pg   MCHC 30.0 30.0 - 36.0 g/dL   RDW 15.8 (H) 11.5 - 15.5 %   Platelets 287 150 - 400 K/uL   nRBC 0.0 0.0 - 0.2 %   Neutrophils Relative % 81 %   Neutro Abs 11.9 (H) 1.7 - 7.7 K/uL   Lymphocytes Relative 6 %   Lymphs Abs 0.9 0.7 - 4.0 K/uL   Monocytes Relative 10 %   Monocytes Absolute 1.4 (H) 0.1 - 1.0 K/uL   Eosinophils Relative 1 %   Eosinophils Absolute 0.1 0.0 - 0.5 K/uL   Basophils Relative 1 %   Basophils Absolute 0.1 0.0 - 0.1 K/uL   Immature Granulocytes 1 %   Abs Immature Granulocytes 0.07 0.00 - 0.07 K/uL  Protime-INR     Status: Abnormal   Collection Time: 08/31/22  3:11 PM  Result Value Ref Range   Prothrombin Time 20.0 (H) 11.4 - 15.2 seconds   INR 1.7 (H) 0.8 - 1.2  Comprehensive metabolic panel  Status: Abnormal   Collection Time: 08/31/22  3:11 PM  Result Value Ref Range   Sodium 134 (L) 135 - 145 mmol/L   Potassium 5.2 (H) 3.5 - 5.1 mmol/L   Chloride 104 98 - 111 mmol/L   CO2 18 (L) 22 - 32 mmol/L   Glucose, Bld 209 (H) 70 - 99 mg/dL   BUN 45 (H) 8 - 23 mg/dL   Creatinine, Ser 2.07 (H) 0.61 - 1.24 mg/dL   Calcium 9.1 8.9 - 10.3 mg/dL   Total Protein 7.9 6.5 - 8.1 g/dL   Albumin 3.7 3.5 - 5.0 g/dL   AST 23 15 - 41 U/L   ALT 15 0 - 44 U/L   Alkaline Phosphatase 104 38 - 126 U/L   Total Bilirubin 1.0 0.3 - 1.2 mg/dL   GFR, Estimated 35 (L) >60 mL/min   Anion gap 12 5 - 15  APTT     Status: Abnormal   Collection Time: 08/31/22  3:11 PM  Result Value Ref Range   aPTT 37 (H) 24 - 36 seconds  Urinalysis, Routine w reflex microscopic Urine, Clean Catch     Status: Abnormal   Collection Time: 08/31/22  4:22 PM  Result Value Ref Range   Color, Urine RED (A) YELLOW   APPearance TURBID (A) CLEAR   Specific Gravity, Urine  1.005 - 1.030    TEST NOT REPORTED DUE TO COLOR INTERFERENCE OF URINE PIGMENT   pH  5.0 - 8.0    TEST NOT REPORTED DUE TO COLOR INTERFERENCE OF URINE PIGMENT   Glucose, UA (A) NEGATIVE mg/dL    TEST NOT REPORTED  DUE TO COLOR INTERFERENCE OF URINE PIGMENT   Hgb urine dipstick (A) NEGATIVE    TEST NOT REPORTED DUE TO COLOR INTERFERENCE OF URINE PIGMENT   Bilirubin Urine (A) NEGATIVE    TEST NOT REPORTED DUE TO COLOR INTERFERENCE OF URINE PIGMENT   Ketones, ur (A) NEGATIVE mg/dL    TEST NOT REPORTED DUE TO COLOR INTERFERENCE OF URINE PIGMENT   Protein, ur (A) NEGATIVE mg/dL    TEST NOT REPORTED DUE TO COLOR INTERFERENCE OF URINE PIGMENT   Nitrite (A) NEGATIVE    TEST NOT REPORTED DUE TO COLOR INTERFERENCE OF URINE PIGMENT   Leukocytes,Ua (A) NEGATIVE    TEST NOT REPORTED DUE TO COLOR INTERFERENCE OF URINE PIGMENT  Urinalysis, Microscopic (reflex)     Status: Abnormal   Collection Time: 08/31/22  4:22 PM  Result Value Ref Range   RBC / HPF >50 0 - 5 RBC/hpf   WBC, UA >50 0 - 5 WBC/hpf   Bacteria, UA FEW (A) NONE SEEN   Squamous Epithelial / LPF 0-5 0 - 5  Lactic acid, plasma     Status: Abnormal   Collection Time: 08/31/22  6:15 PM  Result Value Ref Range   Lactic Acid, Venous 5.0 (HH) 0.5 - 1.9 mmol/L   No results found for this or any previous visit (from the past 240 hour(s)). Creatinine: Recent Labs    08/31/22 1511  CREATININE 2.07*   Procedure: I irrigated the bladder with a catheter tip syringe and 2 L of sterile water.  I got a moderate amount of clot out.  I irrigated until clear.  Impression/Assessment:  Gross hematuria Possible UTI  Plan:  Agree with starting antibiotics.  Bactrim unless lactate remains elevated.  If he has signs of infection, IV antibiotics will be started.  Recommend nurse flush catheter every 2 hours.  If he starts  to have worsening hematuria, may need to switch out his catheter tomorrow for three-way catheter to start irrigation.Agree with holding Eliquis.  We will try to hold off on any procedural intervention until at least 2 days off Eliquis.  If urine clears up, may be able to avoid this entirely.  He will ultimately need a cystoscopy for further  evaluation.  Continue finasteride and tamsulosin.  Marton Redwood, III 08/31/2022, 8:30 PM

## 2022-08-31 NOTE — ED Provider Triage Note (Signed)
Emergency Medicine Provider Triage Evaluation Note  Peter Yang , a 66 y.o. male  was evaluated in triage.  Pt complains of hematuria.  Patient has history of recent hospitalizations for hematuria, altered mental status, ESBL UTI.  Currently resides at a facility.  States that they noted a lot of blood when they went to change him today.  Patient states that he has had gross hematuria since recent discharge.  No fevers, vomiting.  No abdominal or flank pain.  Patient feels that they overreacted and sending him to the emergency department today.  Review of Systems  Positive: Hematuria Negative: Abdominal pain  Physical Exam  BP 99/65 (BP Location: Left Arm)   Pulse (!) 101   Temp 97.9 F (36.6 C) (Oral)   Resp 16   SpO2 100%  Gen:   Awake, no distress   Resp:  Normal effort  MSK:   Moves extremities without difficulty  Other:    Medical Decision Making  Medically screening exam initiated at 8:36 AM.  Appropriate orders placed.  [name] was informed that the remainder of the evaluation will be completed by another provider, this initial triage assessment does not replace that evaluation, and the importance of remaining in the ED until their evaluation is complete.     Carlisle Cater, PA-C 08/31/22 [phone]

## 2022-08-31 NOTE — Progress Notes (Addendum)
Paged by RN: repeat lactate 7.9: 1) upgrading bed to SDU 2) ordering merrem + amoxicillin 3) sepsis pathway 4) PCCM consult. 5) back pain between shoulder blades per pt: ordering trop

## 2022-08-31 NOTE — Assessment & Plan Note (Signed)
Hold metformin given AKI and lactic acidosis. SSI sensitive Q4H

## 2022-08-31 NOTE — Assessment & Plan Note (Signed)
Pre renal / ATN vs post renal / obstructive uropathy in setting of gross hematuria with clots requiring foley placement and irrigation. Strict intake and output Tele monitor Repeat BMP in AM IVF: 2L bolus in ED + 125 cc/hr

## 2022-08-31 NOTE — Progress Notes (Signed)
Pharmacy Antibiotic Note  Peter Yang is a 66 y.o. male admitted on 08/31/2022 with UTI.  Pharmacy has been consulted for meropenem dosing.  Plan: Meropenem 1g IV q8 per current renal function     Temp (24hrs), Avg:97.7 F (36.5 C), Min:97.4 F (36.3 C), Max:97.9 F (36.6 C)  Recent Labs  Lab 08/31/22 1510 08/31/22 1511 08/31/22 1815 08/31/22 2021  WBC 14.4*  --   --   --   CREATININE  --  2.07*  --   --   LATICACIDVEN  --   --  5.0* 7.9*    CrCl cannot be calculated (Unknown ideal weight.).    No Known Allergies    Thank you for allowing pharmacy to be a part of this patient's care.  Kara Mead 08/31/2022 9:35 PM

## 2022-08-31 NOTE — Sepsis Progress Note (Signed)
Elink following for sepsis protocol. 

## 2022-08-31 NOTE — Consult Note (Incomplete)
NAME:  Peter Yang, MRN:  [id number], DOB:  August 31, 1956, LOS: 0 ADMISSION DATE:  08/31/2022, CONSULTATION DATE:  11/11 REFERRING MD:  Lelon Mast FOR CONSULT:  sepsis   History of Present Illness:  Peter Yang is an 66 y.o. M who presented to the [facility] ED with a chief complaint of hematuria.  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  Sent to the emergency department today by SNF staff.  At the emergency department he developed hypotension with a BP of 83/63 WBC 14.2, lactate 5.0 > 7.9.  Code sepsis was called.  Blood cultures and urine cultures were obtained.  Patient was given fluid bolus per sepsis guidelines.  Patient with complicated infectious history-started on meropenem and amoxicillin by [facility].  Urology was consulted for hematuria and concern for forniers.  Recommends monitoring with possible cystoscopy once Eliquis washout.  [facility] admitted patient.  PCCM was consulted for assistance with sepsis with high lactate.   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  No pain.   Objective   Blood pressure 113/62, pulse (!) 107, temperature 97.6 F (36.4 C), resp. rate 17, SpO2 97 %.        Intake/Output Summary (Last 24 hours) at 08/31/2022 2354 Last data filed at 08/31/2022 1806 Gross per 24 hour  Intake 2000 ml  Output --  Net 2000 ml   There were no vitals filed for this visit.  Examination: General: NAD,  sleeping, easily arousable  HENT: NCAT, some temporal wasting  Lungs: CTAB  Cardiovascular: RRR no mgr  Abdomen: nt, nd, nbs  Extremities: L BKA, R some excoriations, no edema  Neuro: sleepy, oriented  GU: =  Labs/imaging WBC 14.1 Hemoglobin 9.5 INR 1.7 K5.2 CO2 18, anion gap 12 Glucose 209 Creatinine 2.07 (baseline 1.2-1.5),BUN 45 UA with hematuria Urine culture in process Blood cultures pending Lactic acid 5.0 > 7.9 Cortisol, procalcitonin, MRSA, troponin pending CT renal stone study: Per rads questionable cystoscopy prostatitis, mild bilateral hydronephrosis, cholelithiasis without cholecystitis, small bilateral pleural effusions Twelve-lead sinus tach, IVCD  Resolved Hospital Problem list     Assessment & Plan:  Septic Shock, suspect urosepsis, hx of ESBL E.Coli and carbapenem resistant proteus UTI on 6 week course of tx Lactic acidosis, secondary to above Cellulitis of scrotum, urology has seen  -Goal MAP 65 or greater. Levophed ordered. Titrate medication to goal -S/P 4000 ml fluid resusitation. ON peripheral levophed now.  Will likely need central line.  Will give one dose albumin now and try to get levophed dose down to 10.   -Obtain/Follow up BC and UC -On Meropenem and amoxicillin. Narrow as cultures result -Trend lactate -Obtain procalcitonin, MRSA PCR -Obtain ECHO -Obtain CXR -Appreciate urology assistance  Hematuria: eliquis held Urology following.   Afib on eliquis Chronic HFrEF HLD -continue holding eliquis -Follow up echo -hold home antihypertensives and GDMT  AKI BPH Creatinine 2.07 (baseline 1.2-1.5),BUN 45 -Ensure renal perfusion. Goal MAP 65 or greater. -  Avoid neprotoxic drugs as possible. -Strict I&O's -Follow up AM creatinine    Best Practice (right click and "Reselect all SmartList Selections" daily)   Diet/type: Regular consistency (see orders) DVT prophylaxis: not indicated GI prophylaxis: N/A Lines: N/A Foley:  N/A Code  Status:  IN chart listed as DNR.  Patient seems to sleepy to confirm discuss in depth right now.  This order was placed by Dr. Alcario Drought.  Should confirm with patient and family today.  Last date of multidisciplinary goals of care discussion '[]'$   Labs   CBC: Recent Labs  Lab 08/31/22 1510  WBC 14.4*  NEUTROABS 11.9*  HGB 9.5*  HCT 31.7*  MCV 88.5  PLT 176    Basic Metabolic Panel: Recent Labs  Lab 08/31/22 1511  NA 134*  K 5.2*  CL 104  CO2 18*  GLUCOSE 209*  BUN 45*  CREATININE 2.07*  CALCIUM 9.1   GFR: CrCl cannot be calculated (Unknown ideal weight.). Recent Labs  Lab 08/31/22 1510 08/31/22 1815 08/31/22 2021  WBC 14.4*  --   --   LATICACIDVEN  --  5.0* 7.9*    Liver Function Tests: Recent Labs  Lab 08/31/22 1511  AST 23  ALT 15  ALKPHOS 104  BILITOT 1.0  PROT 7.9  ALBUMIN 3.7   No results for input(s): "LIPASE", "AMYLASE" in the last 168 hours. No results for input(s): "AMMONIA" in the last 168 hours.  ABG    Component Value Date/Time   HCO3 15.9 (L) 06/25/2022 1706   TCO2 17 (L) 06/25/2022 1706   ACIDBASEDEF 8.0 (H) 06/25/2022 1706   O2SAT 36 06/25/2022 1706     Coagulation Profile: Recent Labs  Lab 08/31/22 1511  INR 1.7*    Cardiac Enzymes: No results for input(s): "CKTOTAL", "CKMB", "CKMBINDEX", "TROPONINI" in the last 168 hours.  HbA1C: Hgb A1c MFr Bld  Date/Time Value Ref Range Status  08/03/2022 04:11 AM 6.6 (H) 4.8 - 5.6 % Final    Comment:    (NOTE) Pre diabetes:          5.7%-6.4%  Diabetes:              >6.4%  Glycemic control for   <7.0% adults with diabetes   06/15/2022 03:22 PM 7.5 (H) 4.8 - 5.6 % Final    Comment:    (NOTE) Pre diabetes:          5.7%-6.4%  Diabetes:              >6.4%  Glycemic control for   <7.0% adults with diabetes     CBG: Recent Labs  Lab 08/31/22 2127  GLUCAP 246*    Review of Systems:   Unable, pt sleeping  Past Medical History:  He,  has a past medical history of BPH  with obstruction/lower urinary tract symptoms (02/02/2018), CAD (coronary artery disease), Congenital talipes calcaneovarus, 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), Coronary artery disease involving native coronary artery of native heart without angina pectoris (06/25/2022), Diabetic polyneuropathy associated with type 2 diabetes mellitus ([place]) (07/28/2021), Essential hypertension (01/25/2016), Hematuria and +fecal occult  (06/25/2022), History of diabetic ulcer of foot (12/31/2019), History of non-ST elevation myocardial infarction (NSTEMI) (06/15/2022), right coronary artery stent placement (02/11/2022), Hyperlipidemia, Ischemic cardiomyopathy, 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), Large bowel perforation ([place]) (09/27/2015), Malignant neoplasm of prostate ([place]) (10/22/2019), Myopia with astigmatism and presbyopia, bilateral (06/15/2018), Nontraumatic complete tear of left rotator cuff (09/20/2019), 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   Surgical History:   Past Surgical History:  Procedure Laterality Date   BELOW KNEE LEG AMPUTATION Left 2022   CORONARY ANGIOPLASTY WITH STENT PLACEMENT  01/2022   DES RCA   LEFT HEART CATH AND CORONARY ANGIOGRAPHY N/A 06/17/2022   Procedure: LEFT HEART CATH AND CORONARY ANGIOGRAPHY;  Surgeon: Nelva Bush, MD;  Location: [facility];  Service: Cardiovascular;  Laterality: N/A;   ROTATOR CUFF REPAIR Left      Social History:   reports that he has never smoked. He has never used smokeless tobacco. He reports that he does not drink alcohol and does not use drugs.   Family History:  His family history includes Cancer in his father and sister;  Diabetes in his brother and mother; Heart attack in his sister; Heart disease in his mother; Hypertension in his mother.   Allergies No Known Allergies   Home Medications  Prior to Admission medications   Medication Sig Start Date End Date Taking? Authorizing Provider  acetaminophen (TYLENOL) 325 MG tablet Take 2 tablets (650 mg total) by mouth every 6 (six) hours as needed for mild pain (or Fever >/= 101). 07/30/21   Sheikh, Omair Latif, DO  Amino Acids-Protein Hydrolys (PRO-STAT AWC) LIQD Take 30 mLs by mouth 2 (two) times daily.    [provider]  apixaban (ELIQUIS) 5 MG TABS tablet Take 1 tablet (5 mg total) by mouth 2 (two) times daily. 08/16/22 09/15/22  Gerrit Heck, MD  aspirin 81 MG chewable tablet Chew 1 tablet (81 mg total) by mouth daily. 08/16/22   Gerrit Heck, MD  atorvastatin (LIPITOR) 80 MG tablet Take 80 mg by mouth daily. 03/17/21   [provider]  clopidogrel (PLAVIX) 75 MG tablet Take 1 tablet (75 mg total) by mouth daily. 08/16/22   Gerrit Heck, MD  diclofenac Sodium (VOLTAREN) 1 % GEL Apply 2 g topically 2 (two) times daily as needed (pain). 07/20/21   [provider]  empagliflozin (JARDIANCE) 10 MG TABS tablet Take 10 mg by mouth daily at 12 noon.    [provider]  finasteride (PROSCAR) 5 MG tablet Take 1 tablet (5 mg total) by mouth daily. 06/30/22 09/28/22  Pokhrel, Corrie Mckusick, MD  gabapentin (NEURONTIN) 300 MG capsule Take 300 mg by mouth at bedtime. 07/20/21   [provider]  Magnesium 500 MG TABS Take 500 mg by mouth at bedtime.    [provider]  metFORMIN (GLUCOPHAGE) 500 MG tablet Take 1,000 mg by mouth 2 (two) times daily with a meal.    [provider]  midodrine (PROAMATINE) 10 MG tablet Take 10 mg by mouth 3 (three) times daily. 07/29/22   [provider]  pantoprazole (PROTONIX) 40 MG tablet Take 1 tablet (40 mg total) by mouth daily. 06/30/22   Pokhrel, Corrie Mckusick, MD  potassium  chloride SA (KLOR-CON M) 20 MEQ tablet Take 20 mEq by mouth daily.    [provider]  Propylene Glycol (SYSTANE BALANCE) 0.6 % SOLN Place 1 drop into both eyes daily as needed.    [provider]  saccharomyces boulardii (FLORASTOR) 250 MG capsule Take 250 mg by mouth 2 (two) times daily.    [provider]  sulfamethoxazole-trimethoprim (BACTRIM DS) 800-160 MG tablet Take 1 tablet by mouth 2 (two) times daily for 18 days.  08/16/22 09/03/22  Gerrit Heck, MD  tamsulosin (FLOMAX) 0.4 MG CAPS capsule Take 0.4 mg by mouth daily. 07/29/22   [provider]     Critical care time: 35 min

## 2022-08-31 NOTE — Assessment & Plan Note (Signed)
Hold eliquis due to gross hematuria with clots

## 2022-08-31 NOTE — ED Triage Notes (Signed)
Pt arrived via EMS, from [facility]. Blood in his urine for several wks.

## 2022-08-31 NOTE — Assessment & Plan Note (Signed)
Continue statin. 

## 2022-08-31 NOTE — ED Provider Notes (Signed)
Physical Exam  BP 113/62   Pulse (!) 107   Temp (!) 97.4 F (36.3 C) (Oral)   Resp 17   SpO2 97%   Physical Exam  Procedures  BLADDER CATHETERIZATION  Date/Time: 08/31/2022 6:43 PM  Performed by: Drenda Freeze, MD Authorized by: Drenda Freeze, MD   Consent:    Consent obtained:  Verbal   Consent given by:  Patient   Risks, benefits, and alternatives were discussed: yes     Risks discussed:  Urethral injury   Alternatives discussed:  No treatment Universal protocol:    Procedure explained and questions answered to patient or proxy's satisfaction: yes     Patient identity confirmed:  Verbally with patient Pre-procedure details:    Procedure purpose:  Diagnostic   Preparation: Patient was prepped and draped in usual sterile fashion   Anesthesia:    Anesthesia method:  Topical application   Topical anesthetic:  Lidocaine gel Procedure details:    Provider performed due to:  Complicated insertion   Catheter insertion:  Indwelling   Catheter size:  20 Fr   Bladder irrigation: yes     Number of attempts:  1   Urine characteristics:  Bloody Post-procedure details:    Procedure completion:  Tolerated   ED Course / MDM    Medical Decision Making Care assumed at 3 PM.  Patient was recently in urinary retention.  Patient had Foley catheter removed while in the hospital.  Patient went to rehab and is sent for hematuria.  Patient is on Eliquis currently.  Signed out pending labs and urinalysis and bladder scan.  4 pm I performed a bladder ultrasound there is about 250 cc in the bladder and there is a large amount of clots.  Given that he has a history of urinary retention and he has gross hematuria, I placed the Foley catheter.  I wanted to put him on continuous bladder irrigation but unable to fit a 22 [place] Foley in.  I was able to place a 20 [place] Foley and was able to manually irrigate the catheter.  The hematuria improves with it.  We will get CT abdomen pelvis  to locate the source of bleeding  6:45 PM Patient's labs and CT imaging was reviewed and independently interpreted.  Patient has acute renal failure.  Urinalysis showed gross hematuria but there is also greater than 50 white blood cell count.  Patient was hypotensive initially that improved with IV fluids.  During last admission, patient was thought to be septic from UTI.  Patient's white blood cell count is 15.  Added on lactate and cultures.  I also ordered Rocephin.  Patient CT scan showed cystoscopy prostatitis and bilateral hydronephrosis.  I discussed case with Dr. Gloriann Loan from urology.  He recommend manual irrigation every 2-3 hours.  He will see patient as a consult and recommend n.p.o. after midnight in case he needs to do a cystoscopy.  Hospitalist to admit.  CRITICAL CARE Performed by: Wandra Arthurs   Total critical care time: 30 minutes  Critical care time was exclusive of separately billable procedures and treating other patients.  Critical care was necessary to treat or prevent imminent or life-threatening deterioration.  Critical care was time spent personally by me on the following activities: development of treatment plan with patient and/or surrogate as well as nursing, discussions with consultants, evaluation of patient's response to treatment, examination of patient, obtaining history from patient or surrogate, ordering and performing treatments and interventions, ordering and review  of laboratory studies, ordering and review of radiographic studies, pulse oximetry and re-evaluation of patient's condition.   Problems Addressed: Gross hematuria: acute illness or injury  Amount and/or Complexity of Data Reviewed Labs: ordered. Decision-making details documented in ED Course. Radiology: ordered and independent interpretation performed. Decision-making details documented in ED Course. ECG/medicine tests: ordered and independent interpretation performed. Decision-making details  documented in ED Course.  Risk Prescription drug management. Decision regarding hospitalization.          Drenda Freeze, MD 08/31/22 [phone]

## 2022-08-31 NOTE — Assessment & Plan Note (Signed)
Gross hematuria with clots and ? Obstructive uropathy. Urology consulted and saw patient EDP unable to get a 3 way catheter placed, was able to get a smaller foley placed Urine cleared up after manual irrigation by urology Will need manual bladder irrigations every couple of hours

## 2022-08-31 NOTE — Assessment & Plan Note (Signed)
EF 40-45%

## 2022-08-31 NOTE — ED Provider Notes (Signed)
Peter Yang Provider Note   CSN: [id number] Arrival date & time: 08/31/22  0800     History  Chief Complaint  Patient presents with   Hematuria    Peter Yang is a 66 y.o. male with T2DM, BPH, HTN, h/o NSTEMI, pAF on Eliquis, ICM, CHF,, thoracic ascending aortic aneurysm, HLD, PVD, stage 2 pressure ulcer, OSA presents from [facility] rehab facility with hematuria.  Per chart review, patient was recently admitted from 08/02/2022 to 08/16/2022 for altered mental status and hypoglycemia.  Patient was found to be COVID-positive, possible catheter associated UTI, had an indwelling Foley from a prior admission which was discontinued during this admission.  Patient was restarted on home finasteride and started on Flomax.  Had macroscopic hematuria during his admission, hemoglobin stayed stable and was instructed to follow-up with urology as an outpatient.    Patient reports that he has had hematuria since he was admitted to the hospital before. Endorses some dysuria but thinks it is because he has not had any water and that he is peeing just blood.  Patient states this blood is similar to what he had been urinating while he was in the hospital.  He does not feel lightheaded or short of breath.  Denies fever/chills, abdominal pain, diarrhea/constipation, melena/hematochezia, testicular pain, flank pain.  Does take Eliquis for paroxysmal A-fib.  Patient states he thinks the rehab facility nurses overreacted and sending him to the emergency department today. States he feels normal other than the hematuria. No falls or trauma to abdomen or back or GU.    Hematuria       Home Medications Prior to Admission medications   Medication Sig Start Date End Date Taking? Authorizing Provider  acetaminophen (TYLENOL) 325 MG tablet Take 2 tablets (650 mg total) by mouth every 6 (six) hours as needed for mild pain (or Fever >/= 101). 07/30/21   Sheikh, Omair Latif,  DO  Amino Acids-Protein Hydrolys (PRO-STAT AWC) LIQD Take 30 mLs by mouth 2 (two) times daily.    [provider]  apixaban (ELIQUIS) 5 MG TABS tablet Take 1 tablet (5 mg total) by mouth 2 (two) times daily. 08/16/22 09/15/22  Gerrit Heck, MD  aspirin 81 MG chewable tablet Chew 1 tablet (81 mg total) by mouth daily. 08/16/22   Gerrit Heck, MD  atorvastatin (LIPITOR) 80 MG tablet Take 80 mg by mouth daily. 03/17/21   [provider]  clopidogrel (PLAVIX) 75 MG tablet Take 1 tablet (75 mg total) by mouth daily. 08/16/22   Gerrit Heck, MD  diclofenac Sodium (VOLTAREN) 1 % GEL Apply 2 g topically 2 (two) times daily as needed (pain). 07/20/21   [provider]  empagliflozin (JARDIANCE) 10 MG TABS tablet Take 10 mg by mouth daily at 12 noon.    [provider]  finasteride (PROSCAR) 5 MG tablet Take 1 tablet (5 mg total) by mouth daily. 06/30/22 09/28/22  Pokhrel, Corrie Mckusick, MD  gabapentin (NEURONTIN) 300 MG capsule Take 300 mg by mouth at bedtime. 07/20/21   [provider]  Magnesium 500 MG TABS Take 500 mg by mouth at bedtime.    [provider]  metFORMIN (GLUCOPHAGE) 500 MG tablet Take 1,000 mg by mouth 2 (two) times daily with a meal.    [provider]  midodrine (PROAMATINE) 10 MG tablet Take 10 mg by mouth 3 (three) times daily. 07/29/22   [provider]  pantoprazole (PROTONIX) 40 MG tablet Take 1 tablet (40 mg total) by  mouth daily. 06/30/22   Pokhrel, Corrie Mckusick, MD  potassium chloride SA (KLOR-CON M) 20 MEQ tablet Take 20 mEq by mouth daily.    [provider]  Propylene Glycol (SYSTANE BALANCE) 0.6 % SOLN Place 1 drop into both eyes daily as needed.    [provider]  saccharomyces boulardii (FLORASTOR) 250 MG capsule Take 250 mg by mouth 2 (two) times daily.    [provider]  sulfamethoxazole-trimethoprim (BACTRIM DS) 800-160 MG tablet Take 1 tablet by mouth 2 (two) times daily for 18  days. 08/16/22 09/03/22  Gerrit Heck, MD  tamsulosin (FLOMAX) 0.4 MG CAPS capsule Take 0.4 mg by mouth daily. 07/29/22   [provider]      Allergies    Patient has no known allergies.    Review of Systems   Review of Systems  Genitourinary:  Positive for hematuria.   Review of systems negative for f/c.  A 10 point review of systems was performed and is negative unless otherwise reported in HPI.  Physical Exam Updated Vital Signs BP 99/65 (BP Location: Left Arm)   Pulse (!) 101   Temp 97.9 F (36.6 C) (Oral)   Resp 16   SpO2 100%  Physical Exam General: Chronically ill-appearing male, lying in bed.  HEENT: PERRLA, Sclera anicteric, Dry mucous membranes, trachea midline. Cardiology: Tachycardic regular rate, no murmurs/rubs/gallops. BL radial and DP pulses equal bilaterally.  Resp: Normal respiratory rate and effort. CTAB, no wheezes, rhonchi, crackles.  Abd: Mild suprapubic TTP. Soft,non-distended. No rebound tenderness or guarding.  GU: Normal appearing circumcised genitalia with erythematous rash of scrotum in diaper. Gross blood dried on inside of thighs. No crepitus, TTP, edema, signs of trauma.  MSK: H/o L BKA. No peripheral edema or signs of trauma. Extremities without deformity or TTP. No cyanosis or clubbing. Skin: warm, dry. Pale.  Back: No CVA tenderness Neuro: A&Ox4, CNs II-XII grossly intact. MAEs. Sensation grossly intact.  Psych: Normal mood and affect.   ED Results / Procedures / Treatments   Labs (all labs ordered are listed, but only abnormal results are displayed) Labs Reviewed  URINE CULTURE  CBC WITH DIFFERENTIAL/PLATELET  URINALYSIS, ROUTINE W REFLEX MICROSCOPIC  PROTIME-INR  COMPREHENSIVE METABOLIC PANEL  APTT    EKG None  Radiology No results found.  Procedures Procedures    Medications Ordered in ED Medications  lactated ringers bolus 1,000 mL (has no administration in time range)    ED Course/ Medical Decision  Making/ A&P                          Medical Decision Making Amount and/or Complexity of Data Reviewed Labs: ordered.   Patient is tachycardic into the 110s, afebrile. BP 99/65.  Patient reports that he is thirsty and he has dry mucous membranes on exam.  Likely that patient is volume depleted and will bolus with 1 L LR and reevaluate his tachycardia.  Differential diagnosis for this patient's hematuria includes but is not limited to:  Iatrogenic/GU trauma especially given recent catheters in hospital; urinary tract infection, esp given recent ESBL UTI; malignancy, BPH. Patient with no rectal pain to suggest prostatitis. Patient does take eliquis. He has had no falls or trauma. Will obtain CMP to evaluate for AKI, electrolyte abnormalities, glomerulonephritis/nephrosis. Consider pharmacologic anticoagulation, will evaluate with PT/INR.  Patient is very pale appearing and there appears to have been a significant amount of gross blood from his urethra given the extent of the dried  blood noted on the inside of his thighs, consider acute chronic blood loss anemia will obtain CBC. Consider blood clots causing patient to not be able to empty his bladder, will obtain a post-void residual to evaluate for urinary retention, though patient states he does feel as though he is able to empty his bladder all the way.   Patient is signed out to the oncoming ED physician who is made aware of her history, presentation, exam, and plan. Patient is pending his lab workup, EKG, and post-void residual bladder scan, and reevaluation after LR bolus.            Final Clinical Impression(s) / ED Diagnoses Final diagnoses:  Gross hematuria    Rx / DC Orders ED Discharge Orders     None        This note was created using dictation software, which may contain spelling or grammatical errors.    Audley Hose, MD 08/31/22 [id number]

## 2022-08-31 NOTE — Assessment & Plan Note (Signed)
Only minimal pain though per patient. No crepitus on exam. I assume Dr. Gloriann Loan was less impressed for any sort of fournier's picture as he was just in to see patient and manually flushing patients foley a couple of hours ago. ABx as above Check MRSA PCR nares

## 2022-08-31 NOTE — Assessment & Plan Note (Signed)
-   Wound care consult 

## 2022-08-31 NOTE — H&P (Addendum)
History and Physical    Patient: Peter Yang NTI:[id number] DOB: 1956-08-15 DOA: 08/31/2022 DOS: the patient was seen and examined on 08/31/2022 PCP: Clovia Cuff, MD  Patient coming from: SNF  Chief Complaint:  Chief Complaint  Patient presents with   Hematuria   HPI: Peter Yang is a 66 y.o. male with medical history significant of CAD/NSTEMI, AAA, TIA, PAF on Eliquis, T2DM, HTN, HLD, HFrEF from ICM EF 40-45%.  Pt with recent admit to [facility] with hematuria and complicated UTI with 2x MDROs.  Although I cannot see the outpatient UCx that came back positive on 9/27, per [facility] notes: "-Patient was started on Levaquin by ER, switched to ertapenem on admission given outpatient urine culture that just grew ESBL E. Coli, resistant to Cipro. -Subsequently urine culture in the ED showed no growth.  -ID consulted: Continue with ertapenem for coverage for ESBL E. coli and amoxicillin for Proteus mirabilis intermediate to imipenem. -The plan at discharge is to transition to Bactrim 1 double strength p.o. twice daily to complete 6-week course of treatment until September 03, 2022, and ID follow-up in the office in 2 weeks."  Pt subsequently admitted to [facility] with [place] 10/13-10/27.  Thought to be most related to either hypoglycemia or underlying infectious process.  His AMS improved by day 2 of admission with correction of his blood sugar and initiation of abx. Remained stable for rest of admission.  UCx and BCx during admission was negative.  Though he was febrile on admission with elevated WBC.  Was also COVID-19 positive.  Pt presents to ED today with ongoing gross hematuria.  Patient reports that he has had hematuria since he was admitted to the hospital before. Endorses some dysuria but thinks it is because he has not had any water and that he is peeing just blood.  Patient states this blood is similar to what he had been urinating while he was in the hospital.  He does not feel lightheaded or short of  breath.  Denies fever/chills, abdominal pain, diarrhea/constipation, melena/hematochezia, testicular pain, flank pain.  Does take Eliquis for paroxysmal A-fib.  Patient states he thinks the rehab facility nurses overreacted and sending him to the emergency department today. States he feels normal other than the hematuria. No falls or trauma to abdomen or back or GU.    Review of Systems: As mentioned in the history of present illness. All other systems reviewed and are negative. 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   Coronary artery disease involving native coronary artery of native heart without angina pectoris 06/25/2022   Diabetic polyneuropathy associated with type 2 diabetes mellitus ([place]) 07/28/2021   Essential hypertension 01/25/2016   Hematuria and +fecal occult  06/25/2022   History of diabetic ulcer of foot 12/31/2019   History of non-ST elevation myocardial infarction (NSTEMI) 06/15/2022   Hx of right coronary artery stent placement 02/11/2022   Hyperlipidemia    Ischemic cardiomyopathy    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 Overview:  Added automatically from request for surgery [id number] Formatting of this note might be different from the original. Added automatically from request for surgery [id number]   Large bowel perforation ([place]) 09/27/2015   Malignant neoplasm of prostate ([place]) 10/22/2019   Myopia with astigmatism and presbyopia, bilateral 06/15/2018   Last Assessment & Plan:  Formatting of this note might be different from the original. - Wrx printed Formatting of this note might be different from the original. 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 - Stable, monitor Formatting of this note might be different from the original. Last Assessment & Plan:  - Stable, monitor   Retroperitoneal abscess ([place]) 10/02/2015   Status post corneal transplant 10/07/2016   Formatting of this note might be different from the original. Overview:  Added automatically from request for surgery [id number] Formatting of this note might be different from the original. Added automatically from request for surgery [id number]  Last Assessment & Plan:  Formatting of this note might be different from the original. - OD, 2/2 KCN - Stable, no NVK / rejection - Continue off steroids - Mo   Thoracic ascending aortic aneurysm ([place])    TIA (transient ischemic attack) 2012   Type 2 diabetes mellitus ([place])    Past Surgical History:  Procedure Laterality Date   BELOW KNEE LEG AMPUTATION Left 2022   CORONARY ANGIOPLASTY WITH STENT PLACEMENT  01/2022   DES RCA   LEFT HEART CATH AND CORONARY ANGIOGRAPHY N/A 06/17/2022   Procedure: LEFT HEART CATH AND CORONARY ANGIOGRAPHY;  Surgeon: Nelva Bush, MD;  Location: [facility];  Service: Cardiovascular;  Laterality: N/A;   ROTATOR CUFF REPAIR Left    Social History:  reports that he has never smoked. He has never used smokeless tobacco. He reports that he does not drink alcohol and does not use drugs.  No Known Allergies  Family History  Problem Relation Age of Onset   Diabetes Mother    Hypertension Mother    Heart disease Mother    Cancer Father    Heart attack Sister    Cancer Sister    Diabetes Brother     Prior to Admission medications   Medication Sig Start Date End Date Taking? Authorizing Provider  acetaminophen (TYLENOL) 325 MG tablet Take 2 tablets (650 mg total) by mouth every 6 (six) hours as  needed for mild pain (or Fever >/= 101). 07/30/21   Sheikh, Omair Latif, DO  Amino Acids-Protein Hydrolys (PRO-STAT AWC) LIQD Take 30 mLs by mouth 2 (two) times daily.    [provider]  apixaban (ELIQUIS) 5 MG TABS tablet Take 1 tablet (5 mg total) by mouth 2 (two) times daily. 08/16/22 09/15/22  Gerrit Heck, MD  aspirin 81 MG chewable tablet Chew 1 tablet (81 mg total) by mouth daily. 08/16/22   Gerrit Heck, MD  atorvastatin (LIPITOR) 80 MG tablet Take 80 mg by mouth daily. 03/17/21   [provider]  clopidogrel (PLAVIX) 75 MG tablet Take 1 tablet (75 mg total) by mouth daily. 08/16/22   Gerrit Heck, MD  diclofenac Sodium (VOLTAREN) 1 % GEL Apply 2 g topically 2 (two) times daily as needed (pain). 07/20/21   [provider]  empagliflozin (JARDIANCE) 10 MG TABS tablet Take 10 mg by mouth daily at 12 noon.    [provider]  finasteride (PROSCAR) 5 MG tablet Take 1 tablet (5 mg total) by mouth daily. 06/30/22 09/28/22  Flora Lipps, MD  gabapentin (NEURONTIN) 300 MG capsule Take 300 mg by mouth at bedtime. 07/20/21   [provider]  Magnesium 500 MG TABS Take 500 mg by mouth at bedtime.    [provider]  metFORMIN (GLUCOPHAGE) 500 MG tablet Take 1,000 mg by mouth 2 (two) times daily with a meal.    [provider]  midodrine (PROAMATINE) 10 MG tablet Take 10 mg by mouth 3 (three) times daily. 07/29/22   [provider]  pantoprazole (PROTONIX) 40 MG tablet Take 1 tablet (40 mg total) by mouth daily. 06/30/22   Pokhrel, Corrie Mckusick, MD  potassium chloride SA (KLOR-CON M) 20 MEQ tablet Take 20 mEq by mouth daily.    [provider]  Propylene Glycol (SYSTANE BALANCE) 0.6 % SOLN Place 1 drop into both eyes daily as needed.    [provider]  saccharomyces boulardii (FLORASTOR) 250 MG capsule Take 250 mg by mouth 2 (two) times daily.    [provider]  sulfamethoxazole-trimethoprim (BACTRIM  DS) 800-160 MG tablet Take 1 tablet by mouth 2 (two) times daily for 18 days. 08/16/22 09/03/22  Gerrit Heck, MD  tamsulosin (FLOMAX) 0.4 MG CAPS capsule Take 0.4 mg by mouth daily. 07/29/22   [provider]    Physical Exam: Vitals:   08/31/22 0810 08/31/22 1506 08/31/22 1600 08/31/22 1747  BP: 99/65 (!) 83/63 90/68 113/62  Pulse: (!) 101 (!) 109 (!) 104 (!) 107  Resp: '16 16 14 17  '$ Temp: 97.9 F (36.6 C) (!) 97.4 F (36.3 C)    TempSrc: Oral Oral    SpO2: 100% 99% 100% 97%   Constitutional: NAD, calm, comfortable, Chronically ill appearing Eyes: PERRL, lids and conjunctivae normal ENMT: Mucous membranes are dry. Posterior pharynx clear of any exudate or lesions.Normal dentition.  Neck: normal, supple, no masses, no thyromegaly Respiratory: clear to auscultation bilaterally, no wheezing, no crackles. Normal respiratory effort. No accessory muscle use.  Cardiovascular: Tachycardic Abdomen: no tenderness, no masses palpated. No hepatosplenomegaly. Bowel sounds positive. No CVA tenderness Musculoskeletal: L BKA Skin: Stage 2 sacral decubitus, but no surrounding erythema or drainage.  Does have fairly severe scrotal erythema, but no crepitus and only minimal pain of scrotum per patient. Neurologic: CN 2-12 grossly intact. Sensation intact, DTR normal. Strength 5/5 in all 4.  Psychiatric: Normal judgment and insight. Alert and oriented x 3. Normal mood.   Data Reviewed:        Latest Ref Rng & Units 08/31/2022    3:10 PM 08/11/2022    2:29 AM 08/10/2022    4:23 AM  CBC  WBC 4.0 - 10.5 K/uL 14.4  7.1  7.4   Hemoglobin 13.0 - 17.0 g/dL 9.5  10.3  9.7   Hematocrit 39.0 - 52.0 % 31.7  30.6  28.9   Platelets 150 - 400 K/uL 287  230  242       Latest Ref Rng & Units 08/31/2022    3:11 PM 08/12/2022    9:08 PM 08/12/2022    4:21 AM  CMP  Glucose 70 - 99 mg/dL 209  232  215   BUN 8 - 23 mg/dL 45  20  21   Creatinine 0.61 - 1.24 mg/dL 2.07  1.27  1.30   Sodium 135  - 145 mmol/L 134  131  132   Potassium 3.5 - 5.1 mmol/L 5.2  4.6  4.5   Chloride 98 - 111 mmol/L 104  101  104   CO2 22 - 32 mmol/L 18  21  18   Calcium 8.9 - 10.3 mg/dL 9.1  8.5  8.4   Total Protein 6.5 - 8.1 g/dL 7.9     Total Bilirubin 0.3 - 1.2 mg/dL 1.0     Alkaline Phos 38 - 126 U/L 104     AST 15 - 41 U/L 23     ALT 0 - 44 U/L 15      Urinalysis    Component Value Date/Time   COLORURINE RED (A) 08/31/2022 1622   APPEARANCEUR TURBID (A) 08/31/2022 1622   LABSPEC  08/31/2022 1622    TEST NOT REPORTED DUE TO COLOR INTERFERENCE OF URINE PIGMENT   PHURINE  08/31/2022 1622    TEST NOT REPORTED DUE TO COLOR INTERFERENCE OF URINE PIGMENT   GLUCOSEU (A) 08/31/2022 1622    TEST NOT REPORTED DUE TO COLOR INTERFERENCE OF URINE PIGMENT   HGBUR (A) 08/31/2022 1622    TEST NOT REPORTED DUE TO COLOR INTERFERENCE OF URINE PIGMENT   BILIRUBINUR (A) 08/31/2022 1622    TEST NOT REPORTED DUE TO COLOR INTERFERENCE OF URINE PIGMENT   KETONESUR (A) 08/31/2022 1622    TEST NOT REPORTED DUE TO COLOR INTERFERENCE OF URINE PIGMENT   PROTEINUR (A) 08/31/2022 1622    TEST NOT REPORTED DUE TO COLOR INTERFERENCE OF URINE PIGMENT   NITRITE (A) 08/31/2022 1622    TEST NOT REPORTED DUE TO COLOR INTERFERENCE OF URINE PIGMENT   LEUKOCYTESUR (A) 08/31/2022 1622    TEST NOT REPORTED DUE TO COLOR INTERFERENCE OF URINE PIGMENT    Lactate 5.0    Assessment and Plan: * Gross hematuria Gross hematuria with clots and ? Obstructive uropathy. Urology consulted and saw patient EDP unable to get a 3 way catheter placed, was able to get a smaller foley placed Urine cleared up after manual irrigation by urology Will need manual bladder irrigations every couple of hours  Acute kidney injury ([place]) Pre renal / ATN vs post renal / obstructive uropathy in setting of gross hematuria with clots requiring foley placement and irrigation. Strict intake and output Tele monitor Repeat BMP in AM IVF: 2L bolus in ED +  125 cc/hr  UTI (urinary tract infection) Continue bactrim DS for 3 more days for ESBL E.Coli + carbapenem resistant proteus UTI (completing 6 week course) If pt gets septic / febrile / lactic acid doesn't improve post IVF: DC bactrim Put pt back on carbapenem + amoxicllin (proteus was apparently sensitive to amoxicllin on cultures, see HPI above).  Borderline low blood pressure determined by examination Pt with low BPs during recent admit to [facility], got started on midodrine that admit. Continue home midodrine for now.  Lactic acidosis Repeating lactate to see if this cleared after IVF Got 2L IVF bolus in ED Cont IVF at 125 cc/hr  Diabetic polyneuropathy associated with type 2 diabetes mellitus ([place]) Hold metformin given AKI and lactic acidosis. SSI sensitive Q4H  Chronic HFrEF (heart failure with reduced ejection fraction) (HCC) EF 40-45%  Paroxysmal atrial fibrillation ([place]) Hold eliquis due to gross hematuria with clots  Pressure ulcer of sacral region, stage 2 ([place]) Wound care consult  Dyslipidemia Continue statin  Cellulitis of scrotum Only minimal pain though per patient. No crepitus on exam. I assume Dr. Gloriann Loan was less impressed for any sort of fournier's picture as he was just in to see patient and manually flushing patients foley a couple of hours ago. ABx as above Check MRSA PCR nares      Advance Care Planning:  Code Status: DNR  Consults: Dr. Gloriann Loan  Family Communication: None at bedside  Severity of Illness: The appropriate patient status for this patient is OBSERVATION. Observation status is judged to be reasonable and necessary in order to provide the required intensity of service to ensure the patient's safety. [name] presenting symptoms, physical exam findings, and initial radiographic and laboratory data in the context of their medical condition is felt to place them at decreased risk for further clinical deterioration. Furthermore, it is  anticipated that the patient will be medically stable for discharge from the hospital within 2 midnights of admission.   Author: Etta Quill., DO 08/31/2022 8:30 PM  For on call review [email or web address].

## 2022-09-01 DIAGNOSIS — J90 Pleural effusion, not elsewhere classified: Secondary | ICD-10-CM | POA: Diagnosis not present

## 2022-09-01 DIAGNOSIS — R310 Gross hematuria: Secondary | ICD-10-CM | POA: Diagnosis not present

## 2022-09-01 DIAGNOSIS — N1330 Unspecified hydronephrosis: Secondary | ICD-10-CM | POA: Diagnosis not present

## 2022-09-01 DIAGNOSIS — R570 Cardiogenic shock: Secondary | ICD-10-CM | POA: Diagnosis present

## 2022-09-01 DIAGNOSIS — Z1613 Resistance to carbapenem: Secondary | ICD-10-CM | POA: Diagnosis present

## 2022-09-01 DIAGNOSIS — Z1612 Extended spectrum beta lactamase (ESBL) resistance: Secondary | ICD-10-CM | POA: Diagnosis present

## 2022-09-01 DIAGNOSIS — N3001 Acute cystitis with hematuria: Secondary | ICD-10-CM | POA: Diagnosis not present

## 2022-09-01 DIAGNOSIS — E43 Unspecified severe protein-calorie malnutrition: Secondary | ICD-10-CM | POA: Diagnosis not present

## 2022-09-01 DIAGNOSIS — I951 Orthostatic hypotension: Secondary | ICD-10-CM | POA: Diagnosis not present

## 2022-09-01 DIAGNOSIS — E872 Acidosis, unspecified: Secondary | ICD-10-CM | POA: Diagnosis present

## 2022-09-01 DIAGNOSIS — Z79899 Other long term (current) drug therapy: Secondary | ICD-10-CM | POA: Diagnosis not present

## 2022-09-01 DIAGNOSIS — I7121 Aneurysm of the ascending aorta, without rupture: Secondary | ICD-10-CM | POA: Diagnosis present

## 2022-09-01 DIAGNOSIS — Z66 Do not resuscitate: Secondary | ICD-10-CM | POA: Diagnosis not present

## 2022-09-01 DIAGNOSIS — I2510 Atherosclerotic heart disease of native coronary artery without angina pectoris: Secondary | ICD-10-CM | POA: Diagnosis not present

## 2022-09-01 DIAGNOSIS — D689 Coagulation defect, unspecified: Secondary | ICD-10-CM | POA: Diagnosis not present

## 2022-09-01 DIAGNOSIS — Z8616 Personal history of COVID-19: Secondary | ICD-10-CM | POA: Diagnosis not present

## 2022-09-01 DIAGNOSIS — R6521 Severe sepsis with septic shock: Secondary | ICD-10-CM | POA: Diagnosis not present

## 2022-09-01 DIAGNOSIS — N410 Acute prostatitis: Secondary | ICD-10-CM | POA: Diagnosis present

## 2022-09-01 DIAGNOSIS — D65 Disseminated intravascular coagulation [defibrination syndrome]: Secondary | ICD-10-CM | POA: Diagnosis not present

## 2022-09-01 DIAGNOSIS — E1140 Type 2 diabetes mellitus with diabetic neuropathy, unspecified: Secondary | ICD-10-CM | POA: Diagnosis not present

## 2022-09-01 DIAGNOSIS — I959 Hypotension, unspecified: Secondary | ICD-10-CM | POA: Diagnosis not present

## 2022-09-01 DIAGNOSIS — D696 Thrombocytopenia, unspecified: Secondary | ICD-10-CM | POA: Diagnosis not present

## 2022-09-01 DIAGNOSIS — I5043 Acute on chronic combined systolic (congestive) and diastolic (congestive) heart failure: Secondary | ICD-10-CM | POA: Diagnosis not present

## 2022-09-01 DIAGNOSIS — I10 Essential (primary) hypertension: Secondary | ICD-10-CM | POA: Diagnosis not present

## 2022-09-01 DIAGNOSIS — I255 Ischemic cardiomyopathy: Secondary | ICD-10-CM | POA: Diagnosis not present

## 2022-09-01 DIAGNOSIS — A419 Sepsis, unspecified organism: Secondary | ICD-10-CM | POA: Diagnosis not present

## 2022-09-01 DIAGNOSIS — I5020 Unspecified systolic (congestive) heart failure: Secondary | ICD-10-CM | POA: Diagnosis not present

## 2022-09-01 DIAGNOSIS — I9589 Other hypotension: Secondary | ICD-10-CM | POA: Diagnosis not present

## 2022-09-01 DIAGNOSIS — R579 Shock, unspecified: Secondary | ICD-10-CM | POA: Diagnosis not present

## 2022-09-01 DIAGNOSIS — N179 Acute kidney failure, unspecified: Secondary | ICD-10-CM | POA: Diagnosis not present

## 2022-09-01 DIAGNOSIS — Z7189 Other specified counseling: Secondary | ICD-10-CM | POA: Diagnosis not present

## 2022-09-01 DIAGNOSIS — E871 Hypo-osmolality and hyponatremia: Secondary | ICD-10-CM | POA: Diagnosis not present

## 2022-09-01 DIAGNOSIS — I5023 Acute on chronic systolic (congestive) heart failure: Secondary | ICD-10-CM | POA: Diagnosis not present

## 2022-09-01 DIAGNOSIS — N492 Inflammatory disorders of scrotum: Secondary | ICD-10-CM | POA: Diagnosis not present

## 2022-09-01 DIAGNOSIS — I110 Hypertensive heart disease with heart failure: Secondary | ICD-10-CM | POA: Diagnosis present

## 2022-09-01 DIAGNOSIS — R5381 Other malaise: Secondary | ICD-10-CM | POA: Diagnosis not present

## 2022-09-01 DIAGNOSIS — D6832 Hemorrhagic disorder due to extrinsic circulating anticoagulants: Secondary | ICD-10-CM | POA: Diagnosis present

## 2022-09-01 DIAGNOSIS — D509 Iron deficiency anemia, unspecified: Secondary | ICD-10-CM | POA: Diagnosis not present

## 2022-09-01 DIAGNOSIS — E669 Obesity, unspecified: Secondary | ICD-10-CM | POA: Diagnosis present

## 2022-09-01 DIAGNOSIS — Z515 Encounter for palliative care: Secondary | ICD-10-CM | POA: Diagnosis not present

## 2022-09-01 DIAGNOSIS — I2583 Coronary atherosclerosis due to lipid rich plaque: Secondary | ICD-10-CM | POA: Diagnosis not present

## 2022-09-01 DIAGNOSIS — D638 Anemia in other chronic diseases classified elsewhere: Secondary | ICD-10-CM | POA: Diagnosis present

## 2022-09-01 DIAGNOSIS — Z1623 Resistance to quinolones and fluoroquinolones: Secondary | ICD-10-CM | POA: Diagnosis present

## 2022-09-01 DIAGNOSIS — R571 Hypovolemic shock: Secondary | ICD-10-CM | POA: Diagnosis not present

## 2022-09-01 DIAGNOSIS — I480 Paroxysmal atrial fibrillation: Secondary | ICD-10-CM | POA: Diagnosis not present

## 2022-09-01 DIAGNOSIS — R188 Other ascites: Secondary | ICD-10-CM | POA: Diagnosis present

## 2022-09-01 DIAGNOSIS — F32A Depression, unspecified: Secondary | ICD-10-CM | POA: Diagnosis not present

## 2022-09-01 DIAGNOSIS — E1142 Type 2 diabetes mellitus with diabetic polyneuropathy: Secondary | ICD-10-CM | POA: Diagnosis present

## 2022-09-01 DIAGNOSIS — M7989 Other specified soft tissue disorders: Secondary | ICD-10-CM | POA: Diagnosis not present

## 2022-09-01 LAB — CBC
HCT: 29.1 % — ABNORMAL LOW (ref 39.0–52.0)
Hemoglobin: 8.4 g/dL — ABNORMAL LOW (ref 13.0–17.0)
MCH: 26.9 pg (ref 26.0–34.0)
MCHC: 28.9 g/dL — ABNORMAL LOW (ref 30.0–36.0)
MCV: 93.3 fL (ref 80.0–100.0)
Platelets: 209 10*3/uL (ref 150–400)
RBC: 3.12 MIL/uL — ABNORMAL LOW (ref 4.22–5.81)
RDW: 16.1 % — ABNORMAL HIGH (ref 11.5–15.5)
WBC: 15 10*3/uL — ABNORMAL HIGH (ref 4.0–10.5)
nRBC: 0 % (ref 0.0–0.2)

## 2022-09-01 LAB — BASIC METABOLIC PANEL
Anion gap: 13 (ref 5–15)
Anion gap: 8 (ref 5–15)
BUN: 45 mg/dL — ABNORMAL HIGH (ref 8–23)
BUN: 45 mg/dL — ABNORMAL HIGH (ref 8–23)
CO2: 13 mmol/L — ABNORMAL LOW (ref 22–32)
CO2: 16 mmol/L — ABNORMAL LOW (ref 22–32)
Calcium: 8.4 mg/dL — ABNORMAL LOW (ref 8.9–10.3)
Calcium: 7.9 mg/dL — ABNORMAL LOW (ref 8.9–10.3)
Chloride: 104 mmol/L (ref 98–111)
Chloride: 104 mmol/L (ref 98–111)
Creatinine, Ser: 2.01 mg/dL — ABNORMAL HIGH (ref 0.61–1.24)
Creatinine, Ser: 1.77 mg/dL — ABNORMAL HIGH (ref 0.61–1.24)
GFR, Estimated: 36 mL/min — ABNORMAL LOW (ref >60)
GFR, Estimated: 42 mL/min — ABNORMAL LOW (ref >60)
Glucose, Bld: 277 mg/dL — ABNORMAL HIGH (ref 70–99)
Glucose, Bld: 263 mg/dL — ABNORMAL HIGH (ref 70–99)
Potassium: 5.6 mmol/L — ABNORMAL HIGH (ref 3.5–5.1)
Potassium: 4.8 mmol/L (ref 3.5–5.1)
Sodium: 130 mmol/L — ABNORMAL LOW (ref 135–145)
Sodium: 128 mmol/L — ABNORMAL LOW (ref 135–145)

## 2022-09-01 LAB — GLUCOSE, CAPILLARY
Glucose-Capillary: 198 mg/dL — ABNORMAL HIGH (ref 70–99)
Glucose-Capillary: 230 mg/dL — ABNORMAL HIGH (ref 70–99)
Glucose-Capillary: 248 mg/dL — ABNORMAL HIGH (ref 70–99)
Glucose-Capillary: 254 mg/dL — ABNORMAL HIGH (ref 70–99)
Glucose-Capillary: 261 mg/dL — ABNORMAL HIGH (ref 70–99)
Glucose-Capillary: 266 mg/dL — ABNORMAL HIGH (ref 70–99)

## 2022-09-01 LAB — POTASSIUM
Potassium: 6.1 mmol/L — ABNORMAL HIGH (ref 3.5–5.1)
Potassium: 4.7 mmol/L (ref 3.5–5.1)
Potassium: 4.6 mmol/L (ref 3.5–5.1)

## 2022-09-01 LAB — LACTIC ACID, PLASMA
Lactic Acid, Venous: 7.2 mmol/L (ref 0.5–1.9)
Lactic Acid, Venous: 5.5 mmol/L (ref 0.5–1.9)
Lactic Acid, Venous: 2.8 mmol/L (ref 0.5–1.9)

## 2022-09-01 LAB — PROCALCITONIN: Procalcitonin: 0.54 ng/mL

## 2022-09-01 LAB — TROPONIN I (HIGH SENSITIVITY): Troponin I (High Sensitivity): 263 ng/L (ref <18)

## 2022-09-01 LAB — CORTISOL: Cortisol, Plasma: 23.6 ug/dL

## 2022-09-01 LAB — MRSA NEXT GEN BY PCR, NASAL: MRSA by PCR Next Gen: NOT DETECTED

## 2022-09-01 MED ORDER — SODIUM CHLORIDE 0.9 % IV SOLN: INTRAVENOUS | Status: DC

## 2022-09-01 MED ORDER — SODIUM BICARBONATE 650 MG PO TABS
650.0000 mg | ORAL_TABLET | Freq: Three times a day (TID) | ORAL | Status: DC
  Administered 2022-09-01 – 2022-09-05 (×13): 650 mg via ORAL
  Filled 2022-09-01 (×14): qty 1

## 2022-09-01 MED ORDER — NOREPINEPHRINE 4 MG/250ML-% IV SOLN
2.0000 ug/min | INTRAVENOUS | Status: DC
  Administered 2022-09-01 (×2): 10 ug/min via INTRAVENOUS
  Administered 2022-09-01: 12 ug/min via INTRAVENOUS
  Administered 2022-09-01: 2 ug/min via INTRAVENOUS
  Administered 2022-09-02: 8 ug/min via INTRAVENOUS
  Administered 2022-09-02: 9 ug/min via INTRAVENOUS
  Administered 2022-09-02: 10 ug/min via INTRAVENOUS
  Administered 2022-09-03: 7 ug/min via INTRAVENOUS
  Filled 2022-09-01 (×9): qty 250

## 2022-09-01 MED ORDER — ORAL CARE MOUTH RINSE: 15.0000 mL | OROMUCOSAL | Status: DC | PRN

## 2022-09-01 MED ORDER — SODIUM CHLORIDE 0.9 % IV SOLN
1.0000 g | Freq: Two times a day (BID) | INTRAVENOUS | Status: DC
  Administered 2022-09-01 – 2022-09-02 (×4): 1 g via INTRAVENOUS
  Filled 2022-09-01 (×5): qty 20

## 2022-09-01 MED ORDER — SODIUM ZIRCONIUM CYCLOSILICATE 10 G PO PACK
10.0000 g | PACK | Freq: Once | ORAL | Status: AC
  Administered 2022-09-01: 10 g via ORAL
  Filled 2022-09-01: qty 1

## 2022-09-01 MED ORDER — LACTATED RINGERS IV BOLUS
1000.0000 mL | Freq: Once | INTRAVENOUS | Status: AC
  Administered 2022-09-01: 1000 mL via INTRAVENOUS

## 2022-09-01 MED ORDER — ALBUMIN HUMAN 25 % IV SOLN
12.5000 g | Freq: Once | INTRAVENOUS | Status: AC
  Administered 2022-09-01: 12.5 g via INTRAVENOUS
  Filled 2022-09-01: qty 50

## 2022-09-01 MED ORDER — ALBUMIN HUMAN 5 % IV SOLN
12.5000 g | Freq: Once | INTRAVENOUS | Status: AC
  Administered 2022-09-01: 12.5 g via INTRAVENOUS
  Filled 2022-09-01: qty 250

## 2022-09-01 MED ORDER — FLUCONAZOLE IN SODIUM CHLORIDE 200-0.9 MG/100ML-% IV SOLN
200.0000 mg | INTRAVENOUS | Status: DC
  Administered 2022-09-01 – 2022-09-04 (×4): 200 mg via INTRAVENOUS
  Filled 2022-09-01 (×4): qty 100

## 2022-09-01 MED ORDER — SODIUM CHLORIDE 0.9 % IV SOLN
250.0000 mL | INTRAVENOUS | Status: DC
  Administered 2022-09-03 – 2022-09-14 (×3): 250 mL via INTRAVENOUS

## 2022-09-01 MED ORDER — CHLORHEXIDINE GLUCONATE CLOTH 2 % EX PADS
6.0000 | MEDICATED_PAD | Freq: Every day | CUTANEOUS | Status: DC
  Administered 2022-09-01 – 2022-10-24 (×49): 6 via TOPICAL

## 2022-09-01 NOTE — Progress Notes (Addendum)
NAME:  Peter Yang, MRN:  [id number], DOB:  [birth year], LOS: 0 ADMISSION DATE:  08/31/2022, CONSULTATION DATE:  11/11 REFERRING MD:  Lelon Mast FOR CONSULT:  sepsis   History of Present Illness:  Peter Yang is an 66 y.o. M who presented to the [facility] ED with a chief complaint of hematuria.  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  Sent to the emergency department today by SNF staff.  At the emergency department he developed hypotension with a BP of 83/63 WBC 14.2, lactate 5.0 > 7.9.  Code sepsis was called.  Blood cultures and urine cultures were obtained.  Patient was given fluid bolus per sepsis guidelines.  Patient with complicated infectious history-started on meropenem and amoxicillin by [facility].  Urology was consulted for hematuria and concern for forniers.  Recommends monitoring with possible cystoscopy once Eliquis washout.  [facility] admitted patient.  PCCM was consulted for assistance with sepsis with high lactate.  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 rate 16, SpO2 100 %.        Intake/Output Summary (Last 24 hours) at 09/01/2022 0756 Last data filed at 09/01/2022 0100 Gross per 24 hour  Intake 2000 ml  Output 750 ml  Net 1250 ml   There were no vitals filed  for this visit.  Examination: Gen:      No acute distress, unkempt HEENT:  EOMI, sclera anicteric Neck:     No masses; no thyromegaly Lungs:    Clear to auscultation bilaterally; normal respiratory effort CV:         Regular rate and rhythm; no murmurs Abd:      + bowel sounds; soft, non-tender; no palpable masses, no distension Ext:    Left BKA, 1-2+ edema Skin:      Warm and dry; no rash Neuro: Awake, responsive  Labs/imaging reviewed Significant for sodium 130, potassium 5.6, creatinine 2.01 WBC 15, hemoglobin 8.4, platelets 209, lactic acid 5.5 No new imaging  Resolved Hospital Problem list     Assessment & Plan:  Septic Shock, suspect urosepsis, hx of ESBL E.Coli and carbapenem resistant proteus UTI on 6 week course of tx Lactic acidosis, secondary to above Cellulitis of scrotum, urology has  Continue peripheral Levophed.  Will need central line today if his pressor requirements remain high Give additional IV fluids Continue antibiotics.  On meropenem and amoxicillin Echocardiogram, chest x-ray pending  Afib on eliquis Chronic HFrEF HLD Continue holding eliquis Gollow up echo Hold home antihypertensives and GDMT  AKI BPH Hematuria Urology following.  Eliquis has been held May need cystoscopy once he stabilizes.  Best Practice (right click and "Reselect all SmartList Selections" daily)   Diet/type: Regular consistency (see orders) DVT prophylaxis: not indicated GI prophylaxis: N/A Lines: N/A Foley:  N/A Code Status:  DNR. Confirmed with the  patient  Critical care time:    The patient is critically ill with multiple organ system failure and requires high complexity decision making for assessment and support, frequent evaluation and titration of therapies, advanced monitoring, review of radiographic studies and interpretation of complex data.   Critical Care Time devoted to patient care services, exclusive of separately billable procedures, described in this  note is 35 minutes.   Peter Garfinkel MD [facility] See Amion for pager  If no response to pager , please call [phone] until 7pm After 7:00 pm call Elink  [phone] 09/01/2022, 7:57 AM

## 2022-09-01 NOTE — Progress Notes (Signed)
Urology Inpatient Progress Report  Gross hematuria [R31.0] AKI (acute kidney injury) ([place]) [N17.9] Hydronephrosis, unspecified hydronephrosis type [N13.30]        Intv/Subj: Urine clearing up.  Has some scrotal erythema.  No pain or discomfort in scrotum.  Principal Problem:   Gross hematuria Active Problems:   Diabetic polyneuropathy associated with type 2 diabetes mellitus (HCC)   Paroxysmal atrial fibrillation (HCC)   Dyslipidemia   Pressure ulcer of sacral region, stage 2 (HCC)   Chronic HFrEF (heart failure with reduced ejection fraction) (HCC)   UTI (urinary tract infection)   Borderline low blood pressure determined by examination   Acute kidney injury ([place])   Lactic acidosis   Cellulitis of scrotum  Current Facility-Administered Medications  Medication Dose Route Frequency Provider Last Rate Last Admin   0.9 %  sodium chloride infusion  250 mL Intravenous Continuous Rigoberto Noel, MD       0.9 %  sodium chloride infusion   Intravenous Continuous Etta Quill, DO 125 mL/hr at 09/01/22 0819 Infusion Verify at 09/01/22 0819   acetaminophen (TYLENOL) tablet 650 mg  650 mg Oral Q6H PRN Etta Quill, DO   650 mg at 09/01/22 0151   Or   acetaminophen (TYLENOL) suppository 650 mg  650 mg Rectal Q6H PRN Etta Quill, DO       amoxicillin (AMOXIL) capsule 500 mg  500 mg Oral Q8H Jennette Kettle M, DO   500 mg at 09/01/22 2563   Chlorhexidine Gluconate Cloth 2 % PADS 6 each  6 each Topical Daily Jennette Kettle M, DO       finasteride (PROSCAR) tablet 5 mg  5 mg Oral Daily Marton Redwood III, MD   5 mg at 09/01/22 0917   insulin aspart (novoLOG) injection 0-9 Units  0-9 Units Subcutaneous Q4H Etta Quill, DO   2 Units at 09/01/22 0803   meropenem (MERREM) 1 g in sodium chloride 0.9 % 100 mL IVPB  1 g Intravenous Q12H LillistonBaltazar Najjar, RPH       midodrine (PROAMATINE) tablet 10 mg  10 mg Oral TID with meals Jennette Kettle M, DO   10 mg at 09/01/22 0816    norepinephrine (LEVOPHED) '4mg'$  in 274m (0.016 mg/mL) premix infusion  2-10 mcg/min Intravenous Titrated ARigoberto Noel MD 45 mL/hr at 09/01/22 0819 12 mcg/min at 09/01/22 0819   Oral care mouth rinse  15 mL Mouth Rinse PRN GEtta Quill DO       sodium bicarbonate tablet 650 mg  650 mg Oral TID KShawna Clamp MD       tamsulosin ([facility] capsule 0.4 mg  0.4 mg Oral Daily GJennette KettleM, DO   0.4 mg at 09/01/22 0917     Objective: Vital: Vitals:   09/01/22 0625 09/01/22 0630 09/01/22 0635 09/01/22 0800  BP: '92/63 94/60 91/63 '$   Pulse: 92 88 87   Resp: '18 13 16   '$ Temp:      TempSrc:      SpO2: 100% 100% 100%   Weight:    90.9 kg   I/Os: I/O last 3 completed shifts: In: 2000 [IV Piggyback:2000] Out: 750 [Urine:750]  Physical Exam:  General: Patient is in no apparent distress Lungs: Normal respiratory effort, chest expands symmetrically. GI: The abdomen is soft and nontender without mass. Genitourinary: Circumcised phallus.  Significant scrotal erythema but no edema or swelling.  No crepitus or fluctuance.  No evidence of Fournier's gangrene. Foley: Draining clear yellow urine  Ext: lower extremities symmetric  Lab Results: Recent Labs    08/31/22 1510 09/01/22 0040  WBC 14.4* 15.0*  HGB 9.5* 8.4*  HCT 31.7* 29.1*   Recent Labs    08/31/22 1511 09/01/22 0040 09/01/22 0717  NA 134* 130*  --   K 5.2* 5.6* 6.1*  CL 104 104  --   CO2 18* 13*  --   GLUCOSE 209* 277*  --   BUN 45* 45*  --   CREATININE 2.07* 2.01*  --   CALCIUM 9.1 8.4*  --    Recent Labs    08/31/22 1511  INR 1.7*   No results for input(s): "LABURIN" in the last 72 hours. Results for orders placed or performed during the hospital encounter of 08/31/22  MRSA Next Gen by PCR, Nasal     Status: None   Collection Time: 09/01/22 12:00 AM   Specimen: Nasal Mucosa; Nasal Swab  Result Value Ref Range Status   MRSA by PCR Next Gen NOT DETECTED NOT DETECTED Final    Comment: (NOTE) The GeneXpert  MRSA Assay (FDA approved for NASAL specimens only), is one component of a comprehensive MRSA colonization surveillance program. It is not intended to diagnose MRSA infection nor to guide or monitor treatment for MRSA infections. Test performance is not FDA approved in patients less than 8 years old. Performed at [facility], [place] [address] [place], [place] [address]     Studies/Results: CT Renal Stone Study  Result Date: 08/31/2022 CLINICAL DATA:  Hematuria, nephrolithiasis, history of prostate cancer EXAM: CT ABDOMEN AND PELVIS WITHOUT CONTRAST TECHNIQUE: Multidetector CT imaging of the abdomen and pelvis was performed following the standard protocol without IV contrast. RADIATION DOSE REDUCTION: This exam was performed according to the departmental dose-optimization program which includes automated exposure control, adjustment of the mA and/or kV according to patient size and/or use of iterative reconstruction technique. COMPARISON:  08/02/2022 FINDINGS: Lower chest: Small bilateral pleural effusions, decreased since prior exam. Minimal dependent lower lobe atelectasis. Hepatobiliary: Calcified gallstones layer dependently within the gallbladder. No evidence of gallbladder wall thickening or pericholecystic fluid. Unremarkable unenhanced appearance of the liver. Pancreas: Unremarkable unenhanced appearance. Spleen: Unremarkable unenhanced appearance. Adrenals/Urinary Tract: No urinary tract calculi. There is mild bilateral hydronephrosis and hydroureter. Foley catheter decompresses the urinary bladder. There is diffuse bladder wall thickening, with high attenuation material within the bladder lumen likely representing blood products given history of hematuria. There is moderate perivesicular fat stranding. The adrenals are unremarkable. Stomach/Bowel: No bowel obstruction or ileus. Mild wall thickening of the rectosigmoid colon is nonspecific given decompressed state.  Vascular/Lymphatic: Aortic atherosclerosis. No enlarged abdominal or pelvic lymph nodes. Reproductive: Prostate appears enlarged, measuring 4.9 x 5.5 cm, with indistinct margins and surrounding fat stranding. Findings are highly concerning for underlying prostatitis. Other: No free fluid or free intraperitoneal gas. No abdominal wall hernia. Musculoskeletal: No acute or destructive bony lesions. Reconstructed images demonstrate no additional findings. IMPRESSION: 1. Inflammatory changes of the bladder and prostate most consistent with underlying cysto-prostatitis. 2. Indwelling Foley catheter, with high attenuation in the bladder lumen likely representing blood products given history of hematuria. 3. Persistent mild bilateral hydronephrosis and hydroureter, likely as a result of inflammatory changes within the prostate and bladder. No radiopaque urinary tract calculi. 4. Cholelithiasis without cholecystitis. 5. Small bilateral pleural effusions, decreased since prior study. 6. Nonspecific wall thickening of the distal rectosigmoid colon. This could be due to decompressed state, the underlying proctitis could give a similar appearance. 7.  Aortic Atherosclerosis (ICD10-I70.0). Electronically Signed   By: Randa Ngo M.D.   On: 08/31/2022 18:27    Assessment: Gross hematuria Scrotal cellulitis  Plan: Continue antibiotics.  Consider adding antifungal coverage.  Continue Foley catheter.  He will eventually need a cystoscopy outpatient.  Fortunately, urine is clearing up.   Link Snuffer, MD Urology 09/01/2022, 9:51 AM

## 2022-09-01 NOTE — Progress Notes (Signed)
Hypotensive inspite of 4 L fluids Start peripheral levo gtt  Britton Perkinson V. Elsworth Soho MD

## 2022-09-01 NOTE — Progress Notes (Signed)
[facility] Progress Note Patient Name: Peter Yang DOB: 11-30-1955 MRN: [id number]   Date of Service  09/01/2022  HPI/Events of Note  Hypotension. Current MAP is 63. No symptoms. Seen on camera. O2 sat 100 on room air. RR 16-18. Patient has been on 10 mic/min levophed. Notes mention possible CVC placement but this has not been done today. Echo pending. Got albumin this AM. Is on NS at 125 cc/hour. Notes mention 3 liter positive but RN notes no signs of fluid overload  eICU Interventions  Trying albumin x 1 Watch for fluid overload If no changes with albumin then will need CVC for higher dose pressor D/w RN     Intervention Category Major Interventions: Shock - evaluation and management  Margaretmary Lombard 09/01/2022, 10:39 PM

## 2022-09-01 NOTE — Progress Notes (Signed)
Pharmacy Antibiotic Note  Peter Yang is a 66 y.o. male is known to pharmacy from current abx consult.  We are consulted to start fluconazole on 11/12 for suspected scrotal infection.  - scr 2.01 (crcl~45)  Plan: - fluconazole 200 mg IV q24h for crcl <50  ___________________________________  Weight: 90.9 kg (200 lb 6.4 oz)  Temp (24hrs), Avg:97.5 F (36.4 C), Min:97.4 F (36.3 C), Max:97.6 F (36.4 C)  Recent Labs  Lab 08/31/22 1510 08/31/22 1511 08/31/22 1815 08/31/22 2021 09/01/22 0040 09/01/22 0245  WBC 14.4*  --   --   --  15.0*  --   CREATININE  --  2.07*  --   --  2.01*  --   LATICACIDVEN  --   --  5.0* 7.9* 7.2* 5.5*    Estimated Creatinine Clearance: 45 mL/min (A) (by C-G formula based on SCr of 2.01 mg/dL (H)).    No Known Allergies   Thank you for allowing pharmacy to be a part of this patient's care.  Lynelle Doctor 09/01/2022 10:15 AM

## 2022-09-01 NOTE — Progress Notes (Signed)
PROGRESS NOTE    Peter Yang  ION:[id number] DOB: [birth year] DOA: 08/31/2022 PCP: Clovia Cuff, MD   Brief Narrative:  This 66 years old male with PMH significant for CAD/NSTEMI, AAA, TIA, PAF on Eliquis, type 2 diabetes, hypertension, HFr EF from ischemic cardiomyopathy with LVEF 40 to 45%, recent COVID+, recently admitted at [facility] with gross hematuria and complicated UTI.  Cultures were positive for ESBL and Proteus treated with imipenem and amoxicillin. He presented in the ED with ongoing gross hematuria, he takes Eliquis for paroxysmal atrial fibrillation.  Urology is consulted for his scrotal erythema and cellulitis.  Urologist recommended continue Bactrim and imipenem, flushing of the catheter every 2 hours.  He may need cystoscopy for further evaluation.  Patient also became hypotensive requiring peripheral Levophed support.  PCCM is consulted.  Assessment & Plan:   Principal Problem:   Gross hematuria Active Problems:   Acute kidney injury ([place])   UTI (urinary tract infection)   Borderline low blood pressure determined by examination   Lactic acidosis   Diabetic polyneuropathy associated with type 2 diabetes mellitus (HCC)   Paroxysmal atrial fibrillation (HCC)   Chronic HFrEF (heart failure with reduced ejection fraction) (HCC)   Pressure ulcer of sacral region, stage 2 (HCC)   Dyslipidemia   Cellulitis of scrotum  Gross hematuria: Patient presented with ongoing gross hematuria with clots. Urology was consulted and evaluated the patient. Urine is getting clear after manual irrigation by urology. Continue manual bladder irrigation every 2 hours. Continue Flomax and Proscar.  Acute kidney injury: Likely prerenal /ATN  vs postrenal/obstructive uropathy in the setting of gross hematuria with clots requiring Foley placement and irrigation. Baseline serum creatinine normal.  Presented with creatinine 2.01 Avoid nephrotoxic medications, continue IV fluid  hydration.  Septic shock secondary to UTI: Continue amoxicillin for ESBL Started on imipenem for resistant Proteus UTI. She became hypotensive despite on midodrine. PCCM consulted, started on peripheral Levophed.  Hypotension: Continue midodrine. Started on Levophed support per PCCM. Keep map above 65.  Lactic acidosis: Likely due to sepsis secondary to UTI. Continue IV fluid resuscitation.  Recheck lactic acid  Type 2 diabetes with polyneuropathy: Hold metformin. Start regular insulin sliding scale.  Chronic HFrEF: LVEF 40 to 45%. Appears hypovolemic.  Continue IV fluid resuscitation  Paroxysmal atrial fibrillation: Heart rate is reasonably controlled.   Hold Eliquis due to gross hematuria with clots  Pressure ulcer of sacral stage II Wound care consult.  Hyperlipidemia: Continue statins  Cellulitis of the scrotum: Continue above antibiotics. Does not appear fourneir's gangrene. Start fluconazole for antifungal coverage.  Hyperkalemia: Lokelma given.  Recheck potassium.  Metabolic acidosis: Continue sodium bicarbonate tablets.  Hyponatremia: Could be secondary to poor hydration. Continue IV saline, recheck potassium   DVT prophylaxis: SCDs Code Status: DNR Family Communication: No family at bedside Disposition Plan:  Status is: Observation The patient remains OBS appropriate and will d/c before 2 midnights.   Admitted for gross hematuria and scrotal cellulitis.  Patient underwent bladder irrigation.  Urology is consulted remains on IV imipenem and amoxicillin for history of ESBL and Proteus UTI.  Patient not medically clear Consultants:  Urology  Procedures: None Antimicrobials:  Anti-infectives (From admission, onward)    Start     Dose/Rate Route Frequency Ordered Stop   09/01/22 1100  meropenem (MERREM) 1 g in sodium chloride 0.9 % 100 mL IVPB        1 g 200 mL/hr over 30 Minutes Intravenous Every 12 hours 09/01/22 0416  09/01/22 0600   meropenem (MERREM) 1 g in sodium chloride 0.9 % 100 mL IVPB  Status:  Discontinued        1 g 200 mL/hr over 30 Minutes Intravenous Every 8 hours 08/31/22 2136 09/01/22 0416   08/31/22 2200  sulfamethoxazole-trimethoprim (BACTRIM DS) 800-160 MG per tablet 1 tablet  Status:  Discontinued        1 tablet Oral 2 times daily 08/31/22 2011 08/31/22 2129   08/31/22 2200  amoxicillin (AMOXIL) capsule 500 mg        500 mg Oral Every 8 hours 08/31/22 2131     08/31/22 2145  meropenem (MERREM) 1 g in sodium chloride 0.9 % 100 mL IVPB        1 g 200 mL/hr over 30 Minutes Intravenous  Once 08/31/22 2136 09/01/22 0820   08/31/22 1815  cefTRIAXone (ROCEPHIN) 1 g in sodium chloride 0.9 % 100 mL IVPB        1 g 200 mL/hr over 30 Minutes Intravenous  Once 08/31/22 1814 08/31/22 1911       Subjective: Patient was seen and examined at bedside.  Overnight events noted. Patient reports doing better.  He denies any pain,  states urine is getting clear. He has indwelling Foley catheter, started on Levophed for being hypotensive despite fluid resuscitation.  Objective: Vitals:   09/01/22 0625 09/01/22 0630 09/01/22 0635 09/01/22 0800  BP: '92/63 94/60 91/63 '$   Pulse: 92 88 87   Resp: '18 13 16   '$ Temp:      TempSrc:      SpO2: 100% 100% 100%   Weight:    90.9 kg    Intake/Output Summary (Last 24 hours) at 09/01/2022 1001 Last data filed at 09/01/2022 0819 Gross per 24 hour  Intake 2693.97 ml  Output 750 ml  Net 1943.97 ml   Filed Weights   09/01/22 0800  Weight: 90.9 kg    Examination:  General exam: Appears comfortable, not in any acute distress, deconditioned. Respiratory system: CTA bilaterally, respiratory effort normal, RR 15. Cardiovascular system: S1 & S2 heard, regular rate and rhythm, no murmur. Gastrointestinal system: Abdomen is soft, non tender, non distended, BS+ Central nervous system: Alert and oriented x 3. No focal neurological deficits. Extremities: Left BKA, no edema, no  cyanosis, no clubbing. Skin: Significant scrotal erythema with clear fluid drainage,  swelling. Psychiatry: Judgement and insight appear normal. Mood & affect appropriate.     Data Reviewed: I have personally reviewed following labs and imaging studies  CBC: Recent Labs  Lab 08/31/22 1510 09/01/22 0040  WBC 14.4* 15.0*  NEUTROABS 11.9*  --   HGB 9.5* 8.4*  HCT 31.7* 29.1*  MCV 88.5 93.3  PLT 287 409   Basic Metabolic Panel: Recent Labs  Lab 08/31/22 1511 09/01/22 0040 09/01/22 0717  NA 134* 130*  --   K 5.2* 5.6* 6.1*  CL 104 104  --   CO2 18* 13*  --   GLUCOSE 209* 277*  --   BUN 45* 45*  --   CREATININE 2.07* 2.01*  --   CALCIUM 9.1 8.4*  --    GFR: Estimated Creatinine Clearance: 45 mL/min (A) (by C-G formula based on SCr of 2.01 mg/dL (H)). Liver Function Tests: Recent Labs  Lab 08/31/22 1511  AST 23  ALT 15  ALKPHOS 104  BILITOT 1.0  PROT 7.9  ALBUMIN 3.7   No results for input(s): "LIPASE", "AMYLASE" in the last 168 hours. No results for input(s): "AMMONIA"  in the last 168 hours. Coagulation Profile: Recent Labs  Lab 08/31/22 1511  INR 1.7*   Cardiac Enzymes: No results for input(s): "CKTOTAL", "CKMB", "CKMBINDEX", "TROPONINI" in the last 168 hours. BNP (last 3 results) No results for input(s): "PROBNP" in the last 8760 hours. HbA1C: No results for input(s): "HGBA1C" in the last 72 hours. CBG: Recent Labs  Lab 08/31/22 2127 09/01/22 0032 09/01/22 0333 09/01/22 0751  GLUCAP 246* 266* 230* 198*   Lipid Profile: No results for input(s): "CHOL", "HDL", "LDLCALC", "TRIG", "CHOLHDL", "LDLDIRECT" in the last 72 hours. Thyroid Function Tests: No results for input(s): "TSH", "T4TOTAL", "FREET4", "T3FREE", "THYROIDAB" in the last 72 hours. Anemia Panel: No results for input(s): "VITAMINB12", "FOLATE", "FERRITIN", "TIBC", "IRON", "RETICCTPCT" in the last 72 hours. Sepsis Labs: Recent Labs  Lab 08/31/22 1815 08/31/22 2021 09/01/22 0040  09/01/22 0245  PROCALCITON  --   --  0.54  --   LATICACIDVEN 5.0* 7.9* 7.2* 5.5*    Recent Results (from the past 240 hour(s))  MRSA Next Gen by PCR, Nasal     Status: None   Collection Time: 09/01/22 12:00 AM   Specimen: Nasal Mucosa; Nasal Swab  Result Value Ref Range Status   MRSA by PCR Next Gen NOT DETECTED NOT DETECTED Final    Comment: (NOTE) The GeneXpert MRSA Assay (FDA approved for NASAL specimens only), is one component of a comprehensive MRSA colonization surveillance program. It is not intended to diagnose MRSA infection nor to guide or monitor treatment for MRSA infections. Test performance is not FDA approved in patients less than 8 years old. Performed at [facility], [place] [address] [place], [place] [address]          Radiology Studies: CT Renal Stone Study  Result Date: 08/31/2022 CLINICAL DATA:  Hematuria, nephrolithiasis, history of prostate cancer EXAM: CT ABDOMEN AND PELVIS WITHOUT CONTRAST TECHNIQUE: Multidetector CT imaging of the abdomen and pelvis was performed following the standard protocol without IV contrast. RADIATION DOSE REDUCTION: This exam was performed according to the departmental dose-optimization program which includes automated exposure control, adjustment of the mA and/or kV according to patient size and/or use of iterative reconstruction technique. COMPARISON:  08/02/2022 FINDINGS: Lower chest: Small bilateral pleural effusions, decreased since prior exam. Minimal dependent lower lobe atelectasis. Hepatobiliary: Calcified gallstones layer dependently within the gallbladder. No evidence of gallbladder wall thickening or pericholecystic fluid. Unremarkable unenhanced appearance of the liver. Pancreas: Unremarkable unenhanced appearance. Spleen: Unremarkable unenhanced appearance. Adrenals/Urinary Tract: No urinary tract calculi. There is mild bilateral hydronephrosis and hydroureter. Foley catheter decompresses the urinary  bladder. There is diffuse bladder wall thickening, with high attenuation material within the bladder lumen likely representing blood products given history of hematuria. There is moderate perivesicular fat stranding. The adrenals are unremarkable. Stomach/Bowel: No bowel obstruction or ileus. Mild wall thickening of the rectosigmoid colon is nonspecific given decompressed state. Vascular/Lymphatic: Aortic atherosclerosis. No enlarged abdominal or pelvic lymph nodes. Reproductive: Prostate appears enlarged, measuring 4.9 x 5.5 cm, with indistinct margins and surrounding fat stranding. Findings are highly concerning for underlying prostatitis. Other: No free fluid or free intraperitoneal gas. No abdominal wall hernia. Musculoskeletal: No acute or destructive bony lesions. Reconstructed images demonstrate no additional findings. IMPRESSION: 1. Inflammatory changes of the bladder and prostate most consistent with underlying cysto-prostatitis. 2. Indwelling Foley catheter, with high attenuation in the bladder lumen likely representing blood products given history of hematuria. 3. Persistent mild bilateral hydronephrosis and hydroureter, likely as a result of inflammatory changes within the  prostate and bladder. No radiopaque urinary tract calculi. 4. Cholelithiasis without cholecystitis. 5. Small bilateral pleural effusions, decreased since prior study. 6. Nonspecific wall thickening of the distal rectosigmoid colon. This could be due to decompressed state, the underlying proctitis could give a similar appearance. 7.  Aortic Atherosclerosis (ICD10-I70.0). Electronically Signed   By: Randa Ngo M.D.   On: 08/31/2022 18:27    Scheduled Meds:  amoxicillin  500 mg Oral Q8H   Chlorhexidine Gluconate Cloth  6 each Topical Daily   finasteride  5 mg Oral Daily   insulin aspart  0-9 Units Subcutaneous Q4H   midodrine  10 mg Oral TID with meals   sodium bicarbonate  650 mg Oral TID   tamsulosin  0.4 mg Oral Daily    Continuous Infusions:  sodium chloride     sodium chloride 125 mL/hr at 09/01/22 0819   meropenem (MERREM) IV     norepinephrine (LEVOPHED) Adult infusion 12 mcg/min (09/01/22 0819)     LOS: 0 days    Time spent: 50 mins    Aubrianna Orchard, MD [facility]   If 7PM-7AM, please contact night-coverage

## 2022-09-01 NOTE — Progress Notes (Signed)
Pharmacy Antibiotic Note  Peter Yang is a 66 y.o. male admitted on 08/31/2022 with UTI.  Pharmacy has been consulted for meropenem dosing.  Using ht (76in) and wt (90.9kg) from 08/16/22 and Scr from today (2.01) estimated CrCl ~76m/min  Plan: Decrease Meropenem 1g IV q12h for CrCl 30-51mmin Monitor renal function and cx data       Temp (24hrs), Avg:97.6 F (36.4 C), Min:97.4 F (36.3 C), Max:97.9 F (36.6 C)  Recent Labs  Lab 08/31/22 1510 08/31/22 1511 08/31/22 1815 08/31/22 2021 09/01/22 0040  WBC 14.4*  --   --   --  15.0*  CREATININE  --  2.07*  --   --  2.01*  LATICACIDVEN  --   --  5.0* 7.9* 7.2*     CrCl cannot be calculated (Unknown ideal weight.).    No Known Allergies  Thank you for allowing pharmacy to be a part of this patient's care.  MiNetta CedarsharmD 09/01/2022 4:16 AM

## 2022-09-02 ENCOUNTER — Encounter (HOSPITAL_COMMUNITY): Payer: Self-pay | Admitting: Internal Medicine

## 2022-09-02 ENCOUNTER — Inpatient Hospital Stay (HOSPITAL_COMMUNITY): Payer: BC Managed Care – PPO

## 2022-09-02 DIAGNOSIS — N410 Acute prostatitis: Secondary | ICD-10-CM | POA: Diagnosis not present

## 2022-09-02 DIAGNOSIS — M7989 Other specified soft tissue disorders: Secondary | ICD-10-CM | POA: Diagnosis not present

## 2022-09-02 DIAGNOSIS — A419 Sepsis, unspecified organism: Secondary | ICD-10-CM | POA: Diagnosis not present

## 2022-09-02 DIAGNOSIS — Z66 Do not resuscitate: Secondary | ICD-10-CM | POA: Diagnosis not present

## 2022-09-02 DIAGNOSIS — D65 Disseminated intravascular coagulation [defibrination syndrome]: Secondary | ICD-10-CM | POA: Diagnosis not present

## 2022-09-02 DIAGNOSIS — R6521 Severe sepsis with septic shock: Secondary | ICD-10-CM | POA: Diagnosis not present

## 2022-09-02 DIAGNOSIS — N3001 Acute cystitis with hematuria: Secondary | ICD-10-CM | POA: Diagnosis not present

## 2022-09-02 DIAGNOSIS — Z515 Encounter for palliative care: Secondary | ICD-10-CM | POA: Diagnosis not present

## 2022-09-02 DIAGNOSIS — R310 Gross hematuria: Secondary | ICD-10-CM | POA: Diagnosis not present

## 2022-09-02 LAB — GLUCOSE, CAPILLARY
Glucose-Capillary: 193 mg/dL — ABNORMAL HIGH (ref 70–99)
Glucose-Capillary: 205 mg/dL — ABNORMAL HIGH (ref 70–99)
Glucose-Capillary: 212 mg/dL — ABNORMAL HIGH (ref 70–99)
Glucose-Capillary: 216 mg/dL — ABNORMAL HIGH (ref 70–99)
Glucose-Capillary: 224 mg/dL — ABNORMAL HIGH (ref 70–99)
Glucose-Capillary: 234 mg/dL — ABNORMAL HIGH (ref 70–99)
Glucose-Capillary: 262 mg/dL — ABNORMAL HIGH (ref 70–99)

## 2022-09-02 LAB — BASIC METABOLIC PANEL
Anion gap: 8 (ref 5–15)
BUN: 32 mg/dL — ABNORMAL HIGH (ref 8–23)
CO2: 19 mmol/L — ABNORMAL LOW (ref 22–32)
Calcium: 7.9 mg/dL — ABNORMAL LOW (ref 8.9–10.3)
Chloride: 106 mmol/L (ref 98–111)
Creatinine, Ser: 1.04 mg/dL (ref 0.61–1.24)
GFR, Estimated: >60 mL/min (ref >60)
Glucose, Bld: 241 mg/dL — ABNORMAL HIGH (ref 70–99)
Potassium: 4.2 mmol/L (ref 3.5–5.1)
Sodium: 133 mmol/L — ABNORMAL LOW (ref 135–145)

## 2022-09-02 LAB — D-DIMER, QUANTITATIVE: D-Dimer, Quant: 2 ug/mL-FEU — ABNORMAL HIGH (ref 0.00–0.50)

## 2022-09-02 LAB — LACTIC ACID, PLASMA: Lactic Acid, Venous: 2 mmol/L (ref 0.5–1.9)

## 2022-09-02 LAB — TROPONIN I (HIGH SENSITIVITY): Troponin I (High Sensitivity): 771 ng/L (ref <18)

## 2022-09-02 LAB — URINE CULTURE: Culture: NO GROWTH

## 2022-09-02 LAB — POTASSIUM: Potassium: 5.4 mmol/L — ABNORMAL HIGH (ref 3.5–5.1)

## 2022-09-02 MED ORDER — INSULIN ASPART 100 UNIT/ML IJ SOLN
0.0000 [IU] | Freq: Three times a day (TID) | INTRAMUSCULAR | Status: DC
  Administered 2022-09-03: 3 [IU] via SUBCUTANEOUS
  Administered 2022-09-03 (×2): 5 [IU] via SUBCUTANEOUS
  Administered 2022-09-03 – 2022-09-04 (×2): 3 [IU] via SUBCUTANEOUS
  Administered 2022-09-04: 5 [IU] via SUBCUTANEOUS
  Administered 2022-09-04 (×2): 3 [IU] via SUBCUTANEOUS
  Administered 2022-09-05: 2 [IU] via SUBCUTANEOUS

## 2022-09-02 MED ORDER — IOHEXOL 350 MG/ML SOLN
100.0000 mL | Freq: Once | INTRAVENOUS | Status: AC | PRN
  Administered 2022-09-02: 100 mL via INTRAVENOUS

## 2022-09-02 MED ORDER — PANTOPRAZOLE SODIUM 40 MG PO TBEC
40.0000 mg | DELAYED_RELEASE_TABLET | Freq: Every day | ORAL | Status: DC
  Administered 2022-09-02 – 2022-10-24 (×53): 40 mg via ORAL
  Filled 2022-09-02 (×54): qty 1

## 2022-09-02 MED ORDER — SODIUM ZIRCONIUM CYCLOSILICATE 10 G PO PACK
10.0000 g | PACK | Freq: Once | ORAL | Status: AC
  Administered 2022-09-02: 10 g via ORAL
  Filled 2022-09-02: qty 1

## 2022-09-02 MED ORDER — SODIUM CHLORIDE (PF) 0.9 % IJ SOLN
INTRAMUSCULAR | Status: AC
  Filled 2022-09-02: qty 50

## 2022-09-02 MED ORDER — ATORVASTATIN CALCIUM 20 MG PO TABS
80.0000 mg | ORAL_TABLET | Freq: Every day | ORAL | Status: DC
  Administered 2022-09-02 – 2022-10-24 (×52): 80 mg via ORAL
  Filled 2022-09-02: qty 4
  Filled 2022-09-02 (×3): qty 2
  Filled 2022-09-02 (×7): qty 4
  Filled 2022-09-02: qty 2
  Filled 2022-09-02: qty 4
  Filled 2022-09-02: qty 2
  Filled 2022-09-02: qty 4
  Filled 2022-09-02: qty 2
  Filled 2022-09-02 (×7): qty 4
  Filled 2022-09-02: qty 2
  Filled 2022-09-02: qty 4
  Filled 2022-09-02 (×3): qty 2
  Filled 2022-09-02: qty 4
  Filled 2022-09-02 (×3): qty 2
  Filled 2022-09-02 (×3): qty 4
  Filled 2022-09-02 (×2): qty 2
  Filled 2022-09-02: qty 4
  Filled 2022-09-02: qty 2
  Filled 2022-09-02: qty 4
  Filled 2022-09-02: qty 2
  Filled 2022-09-02: qty 4
  Filled 2022-09-02: qty 2
  Filled 2022-09-02 (×3): qty 4
  Filled 2022-09-02 (×4): qty 2
  Filled 2022-09-02: qty 4
  Filled 2022-09-02 (×2): qty 2

## 2022-09-02 MED ORDER — INSULIN DETEMIR 100 UNIT/ML ~~LOC~~ SOLN
8.0000 [IU] | Freq: Every day | SUBCUTANEOUS | Status: DC
  Administered 2022-09-02: 8 [IU] via SUBCUTANEOUS
  Filled 2022-09-02: qty 0.08

## 2022-09-02 MED ORDER — INSULIN GLARGINE-YFGN 100 UNIT/ML ~~LOC~~ SOLN: 10.0000 [IU] | Freq: Every day | SUBCUTANEOUS | Status: DC

## 2022-09-02 MED ORDER — INSULIN DETEMIR 100 UNIT/ML ~~LOC~~ SOLN
10.0000 [IU] | Freq: Every day | SUBCUTANEOUS | Status: DC
  Administered 2022-09-03: 10 [IU] via SUBCUTANEOUS
  Filled 2022-09-02 (×2): qty 0.1

## 2022-09-02 NOTE — Progress Notes (Signed)
NAME:  Peter Yang, MRN:  [id number], DOB:  November 07, 1955, LOS: 1 ADMISSION DATE:  08/31/2022, CONSULTATION DATE:  11/11 REFERRING MD:  Lelon Mast FOR CONSULT: sepsis   History of Present Illness:  Peter Yang is an 66 y.o. M who presented to the [facility] ED with a chief complaint of hematuria.  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  Sent to the emergency department SNF staff on 11/11.  At the emergency department he developed hypotension with a BP of 83/63. WBC 14.2, lactate 5.0 > 7.9.  Code sepsis was called.  Blood cultures and urine cultures were obtained.  Patient was given fluid bolus per sepsis guidelines.  Patient with complicated infectious history-started on meropenem and amoxicillin by [facility].  Urology was consulted for hematuria and concern for forniers.  Recommends monitoring with possible cystoscopy once Eliquis washout.  [facility] admitted patient.  PCCM was consulted for assistance with sepsis with high lactate.  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% BPH (hx prostate CA) SNF resident  Significant Hospital Events: Including procedures, antibiotic start and stop dates in addition to other pertinent events   11/11 Admit by [facility], BC>, UC>, PCCM consult, Mero and amoxicillin> 11/12 Started on peripheral Levophed for persistent hypotension, albumin  Interim History / Subjective:  Afebrile Pt reports hematuria was present for many weeks, did not have a Foley catheter prior to presentation On 10 of Levophed, received Albumin x1 overnight -1.6L UOP in last 24hrs, +3.7 L since admit BG range 193-266  Objective   Blood pressure 97/64, pulse 90, temperature 98.2 F (36.8 C), temperature source Axillary, resp.  rate 14, weight 90.9 kg, SpO2 100 %.        Intake/Output Summary (Last 24 hours) at 09/02/2022 0727 Last data filed at 09/02/2022 0525 Gross per 24 hour  Intake 4071.43 ml  Output 1625 ml  Net 2446.43 ml   Filed Weights   09/01/22 0800  Weight: 90.9 kg   Examination: General:  chronically ill appearing male sitting up in bed in no acute distress  HEENT: MM pink/moist, PERRL, anicteric Neuro: AAOx4, MAE, appropriate in conversation CV: s1s2 RRR, no m/r/g, NSR on monitor PULM: clear to auscultation bilaterally, normal respiratory effort at rest GI: soft, bsx4 active, tolerates p.o. Extremities: warm/dry, no edema, L BKA Skin: scrotal erythema and edema GU: Purulent discharge from meatus  Resolved Hospital Problem list     Assessment & Plan:   Shock, suspected Septic from Urosepsis   Lactic acidosis, secondary to above Cellulitis of Scrotum Hx of ESBL E.Coli and carbapenem resistant proteus UTI on 6 week course of tx. Cortisol wnl.  -wean levophed for MAP >65  -continue amoxicillin, meropenem per ID  -fluconazole added per Urology  -follow repeat lactate, troponin, d-dimer, BNP -assess ECHO, prior known EF 40%, rule out acute reduction of EF with sepsis as cause of hypotension  -assess LE venous duplex 11/13 > negative  -NS 125 ml/hr  -Note discharge blood pressures in October were in 84O systolic -Given difficulties with labs and vasopressors, patient may need more advanced access  AKI BPH Hematuria Urology following, recommending outpatient cystoscopy -appreciate Urology evaluation  -hold eliquis  -continue home proscar, flomax  -follow I/O's  -Trend BMP /  urinary output -Replace electrolytes as indicated -Avoid nephrotoxic agents, ensure adequate renal perfusion -Continue sodium bicarbonate tablets  Afib on Eliquis Chronic HFrEF -hold eliquis in setting of hematuria  -hold home jardiance, he should not go back on this medication given UTIs -repeat ECHO  as above  -Telemetry monitoring  CAD HLD -Hold home Plavix and ASA -Resume home Lipitor  DM2 -Hold home metformin  -Glucose range 193-266 -SSI, sensitive scale -Increase Levemir to 10 units daily  Hyperkalemia/Hyponatremia -Lokelma x1 -Follow-up repeat labs -NS at 125 mL/hr  Chronic Stage 2 sacral ulcer -Per WOC  Best Practice (right click and "Reselect all SmartList Selections" daily)  Diet/type: Regular consistency (see orders) DVT prophylaxis: SCD's GI prophylaxis: PPI Lines: N/A Foley:  yes and it is still needed Code Status:  DNR. Confirmed with patient.  Critical care time: 33 minutes   Noe Gens, MSN, APRN, NP-C, AGACNP-BC [facility] 09/02/2022, 9:30 AM   Please see [email or web address] for pager details.   From 7A-7P if no response, please call [phone] After hours, please call ELink [phone]

## 2022-09-02 NOTE — Progress Notes (Signed)
PROGRESS NOTE    Peter Yang  ZWC:[id number] DOB: 09-12-1956 DOA: 08/31/2022 PCP: Clovia Cuff, MD   Brief Narrative:  This 66 years old male with PMH significant for CAD/NSTEMI, AAA, TIA, PAF on Eliquis, type 2 diabetes, hypertension, HFr EF from ischemic cardiomyopathy with LVEF 40 to 45%, recent COVID+, recently admitted at [facility] with gross hematuria and complicated UTI.  Cultures were positive for ESBL and Proteus treated with imipenem and amoxicillin. He presented in the ED with ongoing gross hematuria, he takes Eliquis for paroxysmal atrial fibrillation.  Urology is consulted for his scrotal erythema and cellulitis.  Urologist recommended continue Bactrim and imipenem, flushing of the catheter every 2 hours.  He may need cystoscopy for further evaluation.  Patient also became hypotensive requiring peripheral Levophed support.  PCCM is consulted.  Assessment & Plan:   Principal Problem:   Gross hematuria Active Problems:   Acute kidney injury ([place])   UTI (urinary tract infection)   Borderline low blood pressure determined by examination   Lactic acidosis   Diabetic polyneuropathy associated with type 2 diabetes mellitus (HCC)   Paroxysmal atrial fibrillation (HCC)   Chronic HFrEF (heart failure with reduced ejection fraction) (HCC)   Pressure ulcer of sacral region, stage 2 (HCC)   Dyslipidemia   Cellulitis of scrotum  Gross hematuria: Patient presented with ongoing gross hematuria with clots. Urology was consulted and evaluated the patient. Urine is getting clear after manual irrigation by urology. Continue manual bladder irrigation every 2 hours. Continue Flomax and Proscar.  Acute kidney injury: Likely prerenal /ATN  vs postrenal/obstructive uropathy in the setting of gross hematuria with clots requiring Foley placement and irrigation. Baseline serum creatinine normal.  Presented with creatinine 2.01 Avoid nephrotoxic medications, continue IV fluid  hydration. Serum creatinine improving 2.01> 1.77  Septic shock secondary to UTI: Continue amoxicillin for ESBL. Started on imipenem for resistant Proteus UTI. She became hypotensive despite on midodrine. PCCM consulted, started on peripheral Levophed. Continue Levophed.  try to wean off as able to.  Hypotension: Continue midodrine. Continue Levophed for now and try to wean off. Keep map above 65.  Lactic acidosis: Likely due to sepsis secondary to UTI. Continue IV fluid resuscitation.  Recheck lactic acid 2.7 Continue IV fluid hydration.  Type 2 diabetes with polyneuropathy: Hold metformin. Continue regular insulin sliding scale. Start Levemir 8 units daily.  Chronic HFrEF: LVEF 40 to 45%. Appears hypovolemic.  Continue IV fluid resuscitation  Paroxysmal atrial fibrillation: Heart rate is reasonably controlled.   Hold Eliquis due to gross hematuria with clots.  Pressure ulcer of sacral stage II Wound care consult.  Hyperlipidemia: Continue Lipitor  Cellulitis of the scrotum: Continue above antibiotics. Does not appear fourneir's gangrene. Started fluconazole for antifungal coverage. Cellulitis has significantly improved.  Hyperkalemia: Lokelma given.  Recheck potassium.  Metabolic acidosis: Continue sodium bicarbonate tablets.  Hyponatremia: Could be secondary to poor hydration. Continue IV saline, recheck potassium   DVT prophylaxis: SCDs Code Status: DNR Family Communication: No family at bedside Disposition Plan:   Status is: Inpatient Remains inpatient appropriate because:    Admitted for gross hematuria and scrotal cellulitis.  Patient underwent bladder irrigation.  Urology is consulted,  remains on IV imipenem and amoxicillin for history of ESBL and Proteus UTI.  Patient not medically clear, remained hypotensive requiring Levophed support.  Consultants:  Urology  Procedures: None Antimicrobials:  Anti-infectives (From admission, onward)     Start     Dose/Rate Route Frequency Ordered Stop   09/01/22 1100  meropenem (MERREM) 1 g in sodium chloride 0.9 % 100 mL IVPB        1 g 200 mL/hr over 30 Minutes Intravenous Every 12 hours 09/01/22 0416     09/01/22 1100  fluconazole (DIFLUCAN) IVPB 200 mg        200 mg 100 mL/hr over 60 Minutes Intravenous Every 24 hours 09/01/22 1020     09/01/22 0600  meropenem (MERREM) 1 g in sodium chloride 0.9 % 100 mL IVPB  Status:  Discontinued        1 g 200 mL/hr over 30 Minutes Intravenous Every 8 hours 08/31/22 2136 09/01/22 0416   08/31/22 2200  sulfamethoxazole-trimethoprim (BACTRIM DS) 800-160 MG per tablet 1 tablet  Status:  Discontinued        1 tablet Oral 2 times daily 08/31/22 2011 08/31/22 2129   08/31/22 2200  amoxicillin (AMOXIL) capsule 500 mg        500 mg Oral Every 8 hours 08/31/22 2131     08/31/22 2145  meropenem (MERREM) 1 g in sodium chloride 0.9 % 100 mL IVPB        1 g 200 mL/hr over 30 Minutes Intravenous  Once 08/31/22 2136 09/01/22 0820   08/31/22 1815  cefTRIAXone (ROCEPHIN) 1 g in sodium chloride 0.9 % 100 mL IVPB        1 g 200 mL/hr over 30 Minutes Intravenous  Once 08/31/22 1814 08/31/22 1911       Subjective: Patient was seen and examined at bedside.Overnight events noted. Patient reports doing better, He denies any pain,  urine is getting clear. He has indwelling Foley catheter, started on Levophed for being hypotensive despite on midodrine.  Objective: Vitals:   09/02/22 0500 09/02/22 0515 09/02/22 0530 09/02/22 0800  BP: 91/66 (!) 95/59 97/64   Pulse: 86 85 90   Resp: '15 17 14   '$ Temp:    98.3 F (36.8 C)  TempSrc:    Oral  SpO2: 99% 97% 100%   Weight:        Intake/Output Summary (Last 24 hours) at 09/02/2022 1053 Last data filed at 09/02/2022 0525 Gross per 24 hour  Intake 3377.46 ml  Output 1625 ml  Net 1752.46 ml   Filed Weights   09/01/22 0800  Weight: 90.9 kg    Examination:  General exam: Appears comfortable, not in any  distress, deconditioned. Respiratory system: CTA bilaterally, respiratory effort normal, RR 15. Cardiovascular system: S1-S2 heard, regular rate and rhythm, no murmur. Gastrointestinal system: Abdomen is soft, non tender, non distended, BS+ Central nervous system: Alert and oriented x 3. No focal neurological deficits. Extremities: Left BKA, no edema, no cyanosis, no clubbing. Skin: Scrotal erythema much improved, less swollen. Psychiatry: Judgement and insight appear normal. Mood & affect appropriate.     Data Reviewed: I have personally reviewed following labs and imaging studies  CBC: Recent Labs  Lab 08/31/22 1510 09/01/22 0040  WBC 14.4* 15.0*  NEUTROABS 11.9*  --   HGB 9.5* 8.4*  HCT 31.7* 29.1*  MCV 88.5 93.3  PLT 287 644   Basic Metabolic Panel: Recent Labs  Lab 08/31/22 1511 09/01/22 0040 09/01/22 0717 09/01/22 1237 09/01/22 1623 09/01/22 2009 09/02/22 0005  NA 134* 130*  --  128*  --   --   --   K 5.2* 5.6* 6.1* 4.8 4.7 4.6 5.4*  CL 104 104  --  104  --   --   --   CO2 18* 13*  --  16*  --   --   --   GLUCOSE 209* 277*  --  263*  --   --   --   BUN 45* 45*  --  45*  --   --   --   CREATININE 2.07* 2.01*  --  1.77*  --   --   --   CALCIUM 9.1 8.4*  --  7.9*  --   --   --    GFR: Estimated Creatinine Clearance: 51.1 mL/min (A) (by C-G formula based on SCr of 1.77 mg/dL (H)). Liver Function Tests: Recent Labs  Lab 08/31/22 1511  AST 23  ALT 15  ALKPHOS 104  BILITOT 1.0  PROT 7.9  ALBUMIN 3.7   No results for input(s): "LIPASE", "AMYLASE" in the last 168 hours. No results for input(s): "AMMONIA" in the last 168 hours. Coagulation Profile: Recent Labs  Lab 08/31/22 1511  INR 1.7*   Cardiac Enzymes: No results for input(s): "CKTOTAL", "CKMB", "CKMBINDEX", "TROPONINI" in the last 168 hours. BNP (last 3 results) No results for input(s): "PROBNP" in the last 8760 hours. HbA1C: No results for input(s): "HGBA1C" in the last 72 hours. CBG: Recent  Labs  Lab 09/01/22 1642 09/01/22 1945 09/02/22 0009 09/02/22 0345 09/02/22 0750  GLUCAP 261* 248* 224* 193* 205*   Lipid Profile: No results for input(s): "CHOL", "HDL", "LDLCALC", "TRIG", "CHOLHDL", "LDLDIRECT" in the last 72 hours. Thyroid Function Tests: No results for input(s): "TSH", "T4TOTAL", "FREET4", "T3FREE", "THYROIDAB" in the last 72 hours. Anemia Panel: No results for input(s): "VITAMINB12", "FOLATE", "FERRITIN", "TIBC", "IRON", "RETICCTPCT" in the last 72 hours. Sepsis Labs: Recent Labs  Lab 08/31/22 2021 09/01/22 0040 09/01/22 0245 09/01/22 1237  PROCALCITON  --  0.54  --   --   LATICACIDVEN 7.9* 7.2* 5.5* 2.8*    Recent Results (from the past 240 hour(s))  Urine Culture     Status: None   Collection Time: 08/31/22  4:22 PM   Specimen: Urine, Clean Catch  Result Value Ref Range Status   Specimen Description   Final    URINE, CLEAN CATCH Performed at [facility], [place] [address], [place], [place] [address]    Special Requests   Final    NONE Performed at [facility], [place] [address] [place], [place] [address]    Culture   Final    NO GROWTH Performed at [facility], [place] [address] [place], [place] [address]    Report Status 09/02/2022 FINAL  Final  Blood culture (routine x 2)     Status: None (Preliminary result)   Collection Time: 08/31/22  6:15 PM   Specimen: BLOOD  Result Value Ref Range Status   Specimen Description   Final    BLOOD SITE NOT SPECIFIED Performed at [place] [address] [place], [place] [address]    Special Requests   Final    BOTTLES DRAWN AEROBIC AND ANAEROBIC Blood Culture adequate volume Performed at [place] [address], [place], [place] [address]    Culture   Final    NO GROWTH <24 HRS Performed at [facility], [place] [place], [place] [address]    Report Status PENDING  Incomplete  Blood culture  (routine x 2)     Status: None (Preliminary result)   Collection Time: 08/31/22  6:30 PM   Specimen: BLOOD  Result Value Ref Range Status   Specimen Description   Final  BLOOD SITE NOT SPECIFIED Performed at [facility], [place] [address], [place], [place] [address]    Special Requests   Final    BOTTLES DRAWN AEROBIC AND ANAEROBIC Blood Culture results may not be optimal due to an inadequate volume of blood received in culture bottles Performed at [place] [address] [place], [place] [address]    Culture   Final    NO GROWTH <24 HRS Performed at [facility], [place] [place], [place] [address]    Report Status PENDING  Incomplete  MRSA Next Gen by PCR, Nasal     Status: None   Collection Time: 09/01/22 12:00 AM   Specimen: Nasal Mucosa; Nasal Swab  Result Value Ref Range Status   MRSA by PCR Next Gen NOT DETECTED NOT DETECTED Final    Comment: (NOTE) The GeneXpert MRSA Assay (FDA approved for NASAL specimens only), is one component of a comprehensive MRSA colonization surveillance program. It is not intended to diagnose MRSA infection nor to guide or monitor treatment for MRSA infections. Test performance is not FDA approved in patients less than 38 years old. Performed at [facility], [place] [address] [place], [place] [address]          Radiology Studies: CT Renal Stone Study  Result Date: 08/31/2022 CLINICAL DATA:  Hematuria, nephrolithiasis, history of prostate cancer EXAM: CT ABDOMEN AND PELVIS WITHOUT CONTRAST TECHNIQUE: Multidetector CT imaging of the abdomen and pelvis was performed following the standard protocol without IV contrast. RADIATION DOSE REDUCTION: This exam was performed according to the departmental dose-optimization program which includes automated exposure control, adjustment of the mA and/or kV according to patient size and/or use of iterative reconstruction  technique. COMPARISON:  08/02/2022 FINDINGS: Lower chest: Small bilateral pleural effusions, decreased since prior exam. Minimal dependent lower lobe atelectasis. Hepatobiliary: Calcified gallstones layer dependently within the gallbladder. No evidence of gallbladder wall thickening or pericholecystic fluid. Unremarkable unenhanced appearance of the liver. Pancreas: Unremarkable unenhanced appearance. Spleen: Unremarkable unenhanced appearance. Adrenals/Urinary Tract: No urinary tract calculi. There is mild bilateral hydronephrosis and hydroureter. Foley catheter decompresses the urinary bladder. There is diffuse bladder wall thickening, with high attenuation material within the bladder lumen likely representing blood products given history of hematuria. There is moderate perivesicular fat stranding. The adrenals are unremarkable. Stomach/Bowel: No bowel obstruction or ileus. Mild wall thickening of the rectosigmoid colon is nonspecific given decompressed state. Vascular/Lymphatic: Aortic atherosclerosis. No enlarged abdominal or pelvic lymph nodes. Reproductive: Prostate appears enlarged, measuring 4.9 x 5.5 cm, with indistinct margins and surrounding fat stranding. Findings are highly concerning for underlying prostatitis. Other: No free fluid or free intraperitoneal gas. No abdominal wall hernia. Musculoskeletal: No acute or destructive bony lesions. Reconstructed images demonstrate no additional findings. IMPRESSION: 1. Inflammatory changes of the bladder and prostate most consistent with underlying cysto-prostatitis. 2. Indwelling Foley catheter, with high attenuation in the bladder lumen likely representing blood products given history of hematuria. 3. Persistent mild bilateral hydronephrosis and hydroureter, likely as a result of inflammatory changes within the prostate and bladder. No radiopaque urinary tract calculi. 4. Cholelithiasis without cholecystitis. 5. Small bilateral pleural effusions, decreased  since prior study. 6. Nonspecific wall thickening of the distal rectosigmoid colon. This could be due to decompressed state, the underlying proctitis could give a similar appearance. 7.  Aortic Atherosclerosis (ICD10-I70.0). Electronically Signed   By: Randa Ngo M.D.   On: 08/31/2022 18:27    Scheduled Meds:  amoxicillin  500 mg  Oral Q8H   Chlorhexidine Gluconate Cloth  6 each Topical Daily   finasteride  5 mg Oral Daily   insulin aspart  0-9 Units Subcutaneous Q4H   insulin detemir  8 Units Subcutaneous Daily   midodrine  10 mg Oral TID with meals   sodium bicarbonate  650 mg Oral TID   tamsulosin  0.4 mg Oral Daily   Continuous Infusions:  sodium chloride     sodium chloride 125 mL/hr at 09/02/22 0117   fluconazole (DIFLUCAN) IV Stopped (09/01/22 1338)   meropenem (MERREM) IV Stopped (09/01/22 2321)   norepinephrine (LEVOPHED) Adult infusion 9 mcg/min (09/02/22 0907)     LOS: 1 day    Time spent: 35 mins    Tom Ragsdale, MD [facility]   If 7PM-7AM, please contact night-coverage

## 2022-09-02 NOTE — Consult Note (Addendum)
[facility] Nurse Consult Note: Reason for Consult: Consult requested for sacrum/buttocks.  Pt is familiar to [facility] team from a recent admission on 10/14.  He has a history of moisture associated skin damage and a Stage 2 pressure injury which was noted as present on admission.  Wound type: Bilat buttocks with patchy areas of red moist macerated skin; appearance is consistent with moisture associated skin damage.   ICD-10 CM Codes for Irritant Dermatitis L24A2 - Due to fecal, urinary or dual incontinence  Sacrum with Stage 2 pressure injury; .3X.3X.1cm, 80% red, 20% yellow and moist, small amt tan drainage.  Pressure Injury POA: Yes Dressing procedure/placement/frequency: Pt is critically ill in ICU with multiple systemic factors whic could cause the wound to decline. Pt is on a low airloss mattress to reduce pressure.  Topical treatment orders provided for bedside nurses to perform as follows: Foam dressing to sacrum/buttocks, change Q 3 days or PRN soiling. Please re-consult if further assistance is needed.  Thank-you,  Julien Girt MSN, [place], [place], [place], [place]

## 2022-09-02 NOTE — Progress Notes (Signed)
Bilateral lower extremity venous duplex has been completed. Preliminary results can be found in CV Proc through chart review.   09/02/22 2:06 PM Peter Yang RVT

## 2022-09-02 NOTE — Inpatient Diabetes Management (Signed)
Inpatient Diabetes Program Recommendations  AACE/ADA: New Consensus Statement on Inpatient Glycemic Control (2015)  Target Ranges:  Prepandial:   less than 140 mg/dL      Peak postprandial:   less than 180 mg/dL (1-2 hours)      Critically ill patients:  140 - 180 mg/dL   Lab Results  Component Value Date   GLUCAP 205 (H) 09/02/2022   HGBA1C 6.6 (H) 08/03/2022    Review of Glycemic Control  Latest Reference Range & Units 09/01/22 16:42 09/01/22 19:45 09/02/22 00:09 09/02/22 03:45 09/02/22 07:50  Glucose-Capillary 70 - 99 mg/dL 261 (H) 248 (H) 224 (H) 193 (H) 205 (H)  (H): Data is abnormally high Diabetes history: Type 2 Dm Outpatient Diabetes medications: Jardiance 10 mg QD, Metformin 1000 mg BID Current orders for Inpatient glycemic control: Novolog 0-9 units Q4H  Inpatient Diabetes Program Recommendations:    Now that patient has resumed diet, consider: -Novolog 0-9 units TID & HS -Levemir 8 units QD  Thanks, Bronson Curb, MSN, RNC-OB Diabetes Coordinator [phone] (8a-5p)

## 2022-09-02 NOTE — Progress Notes (Signed)
eLink Physician-Brief Progress Note Patient Name: Peter Yang DOB: Feb 05, 1956 MRN: [id number]   Date of Service  09/02/2022  HPI/Events of Note  Patient needs changes to CBG and SSI, he's had a bump in Troponin's without any complaints of chest pain, D-Dimer + with negative bilateral lower extremity dopplers for DVT.  eICU Interventions  CBG + SSI adjustments done, stat 12 lead EKG, Echo result is pending, CT PE chest .        [name] Ponciano [place] 09/02/2022, 10:02 PM

## 2022-09-02 NOTE — TOC Initial Note (Signed)
Transition of Care [facility]) - Initial/Assessment Note    Patient Details  Name: Peter Yang MRN: [id number] Date of Birth: [birth year]  Transition of [facility]) CM/SW Contact:    Dessa Phi, RN Phone Number: 09/02/2022, 10:34 AM  Clinical Narrative: From [place]-ST SNF rep Christine aware-will need PT recc prior starting fl2.Facility to get auth.For return back [facility] once stable.                    Barriers to Discharge: Continued Medical Work up   Patient Goals and CMS Choice        Expected Discharge Plan and Services                                                Prior Living Arrangements/Services                       Activities of Daily Living      Permission Sought/Granted                  Emotional Assessment              Admission diagnosis:  Gross hematuria [R31.0] AKI (acute kidney injury) ([place]) [N17.9] Hydronephrosis, unspecified hydronephrosis type [N13.30] Patient Active Problem List   Diagnosis Date Noted   Gross hematuria 08/31/2022   Lactic acidosis 08/31/2022   Cellulitis of scrotum 08/31/2022   Type 2 diabetes mellitus with diabetic peripheral angiopathy without gangrene, without long-term current use of insulin (HCC)    Acute kidney injury ([place])    Impaired functional mobility, balance, gait, and endurance 08/03/2022   Obstructive sleep apnea syndrome 08/03/2022   Pleural effusion, bilateral 08/03/2022   Catheter-associated urinary tract infection ([place]) 08/03/2022   Lab test positive for detection of COVID-19 virus 08/03/2022   History of urinary retention 08/03/2022   Mitral valve regurgitation 08/03/2022   UTI (urinary tract infection) 08/02/2022   Thoracic ascending aortic aneurysm ([place]) 07/23/2022   Borderline low blood pressure determined by examination 07/04/2022   Hematuria 06/25/2022   History of non-ST elevation myocardial infarction (NSTEMI) 06/15/2022   Paroxysmal atrial  fibrillation ([place]) 06/15/2022   Dyslipidemia 06/15/2022   PVD (peripheral vascular disease) ([place]) 06/15/2022   Pressure ulcer of sacral region, stage 2 ([place]) 06/15/2022   Chronic HFrEF (heart failure with reduced ejection fraction) ([place]) 06/15/2022   DNR (do not resuscitate) 06/15/2022   Ischemic cardiomyopathy 02/12/2022   Need for assistance with personal care 08/03/2021   Diabetic polyneuropathy associated with type 2 diabetes mellitus ([place]) 07/28/2021   Prolonged QT interval 07/28/2021   Moderate protein-calorie malnutrition ([place]) 06/29/2021   Chronic insomnia 03/31/2021   BPH with obstruction/lower urinary tract symptoms 02/02/2018   Hyperlipidemia 04/15/2016   Vitamin D deficiency 04/09/2016   Essential hypertension 01/25/2016   PCP:  Clovia Cuff, MD Pharmacy:   [facility] [place], [place] - [address] [place] [address] Phone: [phone] Fax: [phone]  [facility] [address] [place] [address] Phone: [phone] Fax: [phone]  [id number] [place] - [address] [place] [address]Phone: [phone]: [phone]  Social Determinants of Health (SDOH) Interventions    Readmission Risk Interventions     No data to display           

## 2022-09-03 ENCOUNTER — Inpatient Hospital Stay (HOSPITAL_COMMUNITY): Payer: BC Managed Care – PPO

## 2022-09-03 ENCOUNTER — Inpatient Hospital Stay: Payer: Self-pay

## 2022-09-03 DIAGNOSIS — I5023 Acute on chronic systolic (congestive) heart failure: Secondary | ICD-10-CM | POA: Diagnosis not present

## 2022-09-03 DIAGNOSIS — R310 Gross hematuria: Secondary | ICD-10-CM | POA: Diagnosis not present

## 2022-09-03 DIAGNOSIS — J90 Pleural effusion, not elsewhere classified: Secondary | ICD-10-CM | POA: Diagnosis not present

## 2022-09-03 DIAGNOSIS — R579 Shock, unspecified: Secondary | ICD-10-CM | POA: Diagnosis not present

## 2022-09-03 DIAGNOSIS — E871 Hypo-osmolality and hyponatremia: Secondary | ICD-10-CM

## 2022-09-03 LAB — COOXEMETRY PANEL
Carboxyhemoglobin: 1.4 % (ref 0.5–1.5)
Carboxyhemoglobin: 2.3 % — ABNORMAL HIGH (ref 0.5–1.5)
Methemoglobin: 0.9 % (ref 0.0–1.5)
Methemoglobin: <0.7 % (ref 0.0–1.5)
O2 Saturation: 51.5 %
O2 Saturation: 74.6 %
Total hemoglobin: 7.8 g/dL — ABNORMAL LOW (ref 12.0–16.0)
Total hemoglobin: 7.5 g/dL — ABNORMAL LOW (ref 12.0–16.0)

## 2022-09-03 LAB — BASIC METABOLIC PANEL
Anion gap: 7 (ref 5–15)
Anion gap: 7 (ref 5–15)
BUN: 32 mg/dL — ABNORMAL HIGH (ref 8–23)
BUN: 29 mg/dL — ABNORMAL HIGH (ref 8–23)
CO2: 18 mmol/L — ABNORMAL LOW (ref 22–32)
CO2: 19 mmol/L — ABNORMAL LOW (ref 22–32)
Calcium: 7.9 mg/dL — ABNORMAL LOW (ref 8.9–10.3)
Calcium: 7.6 mg/dL — ABNORMAL LOW (ref 8.9–10.3)
Chloride: 108 mmol/L (ref 98–111)
Chloride: 105 mmol/L (ref 98–111)
Creatinine, Ser: 0.96 mg/dL (ref 0.61–1.24)
Creatinine, Ser: 0.97 mg/dL (ref 0.61–1.24)
GFR, Estimated: >60 mL/min (ref >60)
GFR, Estimated: >60 mL/min (ref >60)
Glucose, Bld: 252 mg/dL — ABNORMAL HIGH (ref 70–99)
Glucose, Bld: 213 mg/dL — ABNORMAL HIGH (ref 70–99)
Potassium: 4.1 mmol/L (ref 3.5–5.1)
Potassium: 3.7 mmol/L (ref 3.5–5.1)
Sodium: 133 mmol/L — ABNORMAL LOW (ref 135–145)
Sodium: 131 mmol/L — ABNORMAL LOW (ref 135–145)

## 2022-09-03 LAB — GLUCOSE, CAPILLARY
Glucose-Capillary: 257 mg/dL — ABNORMAL HIGH (ref 70–99)
Glucose-Capillary: 255 mg/dL — ABNORMAL HIGH (ref 70–99)
Glucose-Capillary: 250 mg/dL — ABNORMAL HIGH (ref 70–99)

## 2022-09-03 LAB — ECHOCARDIOGRAM COMPLETE
AR max vel: 2.66 cm2
AV Area VTI: 3.12 cm2
AV Area mean vel: 2.78 cm2
AV Mean grad: 2 mmHg
AV Peak grad: 3.5 mmHg
Ao pk vel: 0.94 m/s
Area-P 1/2: 3.59 cm2
Calc EF: 29.1 %
MV M vel: 3.76 m/s
MV Peak grad: 56.6 mmHg
Radius: 0.7 cm
S' Lateral: 4.5 cm
Single Plane A2C EF: 20.1 %
Single Plane A4C EF: 34.5 %
Weight: 3206.37 oz

## 2022-09-03 LAB — MAGNESIUM
Magnesium: 1.9 mg/dL (ref 1.7–2.4)
Magnesium: 1.7 mg/dL (ref 1.7–2.4)

## 2022-09-03 LAB — CBC
HCT: 27 % — ABNORMAL LOW (ref 39.0–52.0)
Hemoglobin: 8.1 g/dL — ABNORMAL LOW (ref 13.0–17.0)
MCH: 26.4 pg (ref 26.0–34.0)
MCHC: 30 g/dL (ref 30.0–36.0)
MCV: 87.9 fL (ref 80.0–100.0)
Platelets: 205 10*3/uL (ref 150–400)
RBC: 3.07 MIL/uL — ABNORMAL LOW (ref 4.22–5.81)
RDW: 16 % — ABNORMAL HIGH (ref 11.5–15.5)
WBC: 7.2 10*3/uL (ref 4.0–10.5)
nRBC: 0 % (ref 0.0–0.2)

## 2022-09-03 LAB — BRAIN NATRIURETIC PEPTIDE: B Natriuretic Peptide: 3094.3 pg/mL — ABNORMAL HIGH (ref 0.0–100.0)

## 2022-09-03 LAB — POTASSIUM: Potassium: 4 mmol/L (ref 3.5–5.1)

## 2022-09-03 LAB — PHOSPHORUS: Phosphorus: 2.3 mg/dL — ABNORMAL LOW (ref 2.5–4.6)

## 2022-09-03 MED ORDER — SODIUM CHLORIDE 0.9 % IV SOLN
1.0000 g | Freq: Three times a day (TID) | INTRAVENOUS | Status: DC
  Administered 2022-09-03 – 2022-09-05 (×7): 1 g via INTRAVENOUS
  Filled 2022-09-03 (×8): qty 20

## 2022-09-03 MED ORDER — FENTANYL CITRATE PF 50 MCG/ML IJ SOSY
25.0000 ug | PREFILLED_SYRINGE | Freq: Once | INTRAMUSCULAR | Status: AC
  Administered 2022-09-03: 25 ug via INTRAVENOUS
  Filled 2022-09-03: qty 1

## 2022-09-03 MED ORDER — PERFLUTREN LIPID MICROSPHERE
1.0000 mL | INTRAVENOUS | Status: AC | PRN
  Administered 2022-09-03: 2 mL via INTRAVENOUS

## 2022-09-03 MED ORDER — SODIUM CHLORIDE 0.9 % IV SOLN: INTRAVENOUS | Status: DC | PRN

## 2022-09-03 MED ORDER — FUROSEMIDE 10 MG/ML IJ SOLN
60.0000 mg | Freq: Once | INTRAMUSCULAR | Status: AC
  Administered 2022-09-03: 60 mg via INTRAVENOUS
  Filled 2022-09-03: qty 6

## 2022-09-03 MED ORDER — SODIUM PHOSPHATES 45 MMOLE/15ML IV SOLN
15.0000 mmol | Freq: Once | INTRAVENOUS | Status: AC
  Administered 2022-09-03: 15 mmol via INTRAVENOUS
  Filled 2022-09-03: qty 5

## 2022-09-03 MED ORDER — DOBUTAMINE IN D5W 4-5 MG/ML-% IV SOLN
2.5000 ug/kg/min | INTRAVENOUS | Status: DC
  Administered 2022-09-03: 2.5 ug/kg/min via INTRAVENOUS
  Filled 2022-09-03: qty 250

## 2022-09-03 MED ORDER — POTASSIUM CHLORIDE 10 MEQ/100ML IV SOLN
10.0000 meq | INTRAVENOUS | Status: AC
  Administered 2022-09-03 (×2): 10 meq via INTRAVENOUS
  Filled 2022-09-03 (×2): qty 100

## 2022-09-03 MED ORDER — K PHOS MONO-SOD PHOS DI & MONO 155-852-130 MG PO TABS
250.0000 mg | ORAL_TABLET | Freq: Three times a day (TID) | ORAL | Status: AC
  Administered 2022-09-03 – 2022-09-04 (×6): 250 mg via ORAL
  Filled 2022-09-03 (×6): qty 1

## 2022-09-03 MED ORDER — SODIUM CHLORIDE 0.9% FLUSH
10.0000 mL | INTRAVENOUS | Status: DC | PRN
  Administered 2022-10-11 (×2): 10 mL
  Administered 2022-10-22: 20 mL

## 2022-09-03 MED ORDER — SODIUM CHLORIDE 0.9% FLUSH
10.0000 mL | Freq: Two times a day (BID) | INTRAVENOUS | Status: DC
  Administered 2022-09-03 (×2): 10 mL
  Administered 2022-09-04: 20 mL
  Administered 2022-09-05 – 2022-09-16 (×23): 10 mL
  Administered 2022-09-17: 40 mL
  Administered 2022-09-17: 10 mL
  Administered 2022-09-18: 40 mL
  Administered 2022-09-20 – 2022-10-13 (×27): 10 mL
  Administered 2022-10-21: 20 mL

## 2022-09-03 MED ORDER — NOREPINEPHRINE 4 MG/250ML-% IV SOLN
0.0000 ug/min | INTRAVENOUS | Status: DC
  Administered 2022-09-04: 6 ug/min via INTRAVENOUS
  Administered 2022-09-04: 4 ug/min via INTRAVENOUS
  Administered 2022-09-05: 6 ug/min via INTRAVENOUS
  Administered 2022-09-05: 5 ug/min via INTRAVENOUS
  Administered 2022-09-06: 6 ug/min via INTRAVENOUS
  Administered 2022-09-06: 7.013 ug/min via INTRAVENOUS
  Administered 2022-09-07: 5 ug/min via INTRAVENOUS
  Administered 2022-09-07: 8 ug/min via INTRAVENOUS
  Administered 2022-09-08: 9 ug/min via INTRAVENOUS
  Administered 2022-09-08: 8 ug/min via INTRAVENOUS
  Administered 2022-09-08: 7 ug/min via INTRAVENOUS
  Administered 2022-09-09: 10 ug/min via INTRAVENOUS
  Administered 2022-09-09: 3 ug/min via INTRAVENOUS
  Administered 2022-09-10: 4 ug/min via INTRAVENOUS
  Administered 2022-09-10: 5 ug/min via INTRAVENOUS
  Administered 2022-09-11 – 2022-09-12 (×2): 4 ug/min via INTRAVENOUS
  Filled 2022-09-03 (×17): qty 250

## 2022-09-03 MED ORDER — INSULIN ASPART 100 UNIT/ML IJ SOLN
3.0000 [IU] | Freq: Three times a day (TID) | INTRAMUSCULAR | Status: DC
  Administered 2022-09-03 – 2022-09-05 (×6): 3 [IU] via SUBCUTANEOUS

## 2022-09-03 MED ORDER — GABAPENTIN 300 MG PO CAPS
300.0000 mg | ORAL_CAPSULE | Freq: Every day | ORAL | Status: DC
  Administered 2022-09-03: 300 mg via ORAL
  Filled 2022-09-03: qty 1

## 2022-09-03 MED ORDER — MAGNESIUM SULFATE 2 GM/50ML IV SOLN
2.0000 g | Freq: Once | INTRAVENOUS | Status: AC
  Administered 2022-09-03: 2 g via INTRAVENOUS
  Filled 2022-09-03: qty 50

## 2022-09-03 NOTE — Progress Notes (Signed)
PT Cancellation Note  Patient Details Name: Peter Yang MRN: [id number] DOB: 07-12-1956   Cancelled Treatment:    Reason Eval/Treat Not Completed: Patient declined, states that he needs sleep. [place] Office [phone] Weekend [phone]    Claretha Cooper 09/03/2022, 2:28 PM

## 2022-09-03 NOTE — Progress Notes (Signed)
Peripherally Inserted Central Catheter Placement  The IV Nurse has discussed with the patient and/or persons authorized to consent for the patient, the purpose of this procedure and the potential benefits and risks involved with this procedure.  The benefits include less needle sticks, lab draws from the catheter, and the patient may be discharged home with the catheter. Risks include, but not limited to, infection, bleeding, blood clot (thrombus formation), and puncture of an artery; nerve damage and irregular heartbeat and possibility to perform a PICC exchange if needed/ordered by physician.  Alternatives to this procedure were also discussed.  Bard Power PICC patient education guide, fact sheet on infection prevention and patient information card has been provided to patient /or left at bedside.    PICC Placement Documentation  PICC Double Lumen 09/03/22 Right Brachial 43 cm 0 cm (Active)  Indication for Insertion or Continuance of Line Vasoactive infusions 09/03/22 1040  Exposed Catheter (cm) 0 cm 09/03/22 1040  Site Assessment Clean, Dry, Intact 09/03/22 1040  Lumen #1 Status Flushed;Saline locked;Blood return noted 09/03/22 1040  Lumen #2 Status Flushed;Saline locked;Blood return noted 09/03/22 1040  Dressing Type Transparent;Securing device 09/03/22 1040  Dressing Status Antimicrobial disc in place 09/03/22 1040  Dressing Intervention New dressing 09/03/22 1040  Dressing Change Due 09/10/22 09/03/22 [place], Myrle Dues [place] 09/03/2022, 10:41 AM

## 2022-09-03 NOTE — Progress Notes (Signed)
Co-ox reviewed, significant improvement with dobutamine (up to 74.6).  Off levophed currently. 1.7L UOP. Mild ectopy per RN.    Plan: -Continue dobutamine, monitor tele closely  -Repeat BMP, Mg+ now -Cardiology to evaluate in am  -Continue levophed for MAP >65 as needed     Noe Gens, MSN, APRN, NP-C, AGACNP-BC [facility] 09/03/2022, 5:53 PM   Please see [email or web address] for pager details.   From 7A-7P if no response, please call [phone] After hours, please call ELink [phone]

## 2022-09-03 NOTE — Progress Notes (Signed)
  Echocardiogram 2D Echocardiogram has been performed.  Peter Yang 09/03/2022, 11:53 AM

## 2022-09-03 NOTE — Progress Notes (Signed)
[facility] Progress Note Patient Name: Peter Yang DOB: 11-Jan-1956 MRN: [id number]   Date of Service  09/03/2022  HPI/Events of Note  K+ 3.7, Mg+ 1.7, Cr 0.97.  eICU Interventions  KCL 10 meq iv Q 1 hour x 2, Mg+SO4 2 gm iv x 1.        [name] Arlenne Kimbley 09/03/2022, 8:03 PM

## 2022-09-03 NOTE — Progress Notes (Signed)
Pharmacy Antibiotic Note  Peter Yang is a 66 y.o. male admitted on 08/31/2022 with UTI.  Pharmacy has been consulted for meropenem dosing.  Scr improved to 1.04, CrCl ~ 86.58ms/min  Plan: Increase merrem to 1gm IV q8h Monitor renal function and cx data    Weight: 90.9 kg (200 lb 6.4 oz)  Temp (24hrs), Avg:98.4 F (36.9 C), Min:98.2 F (36.8 C), Max:98.8 F (37.1 C)  Recent Labs  Lab 08/31/22 1510 08/31/22 1511 08/31/22 1815 08/31/22 2021 09/01/22 0040 09/01/22 0245 09/01/22 1237 09/02/22 1935  WBC 14.4*  --   --   --  15.0*  --   --   --   CREATININE  --  2.07*  --   --  2.01*  --  1.77* 1.04  LATICACIDVEN  --   --    < > 7.9* 7.2* 5.5* 2.8* 2.0*   < > = values in this interval not displayed.     Estimated Creatinine Clearance: 86.9 mL/min (by C-G formula based on SCr of 1.04 mg/dL).    No Known Allergies  Thank you for allowing pharmacy to be a part of this patient's care. EDolly RiasRPh 09/03/2022, 2:03 AM
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
## 2022-09-03 NOTE — Progress Notes (Addendum)
NAME:  Peter Yang, MRN:  [id number], DOB:  1956/08/23, LOS: 2 ADMISSION DATE:  08/31/2022, CONSULTATION DATE:  11/11 REFERRING MD:  Lelon Mast FOR CONSULT: sepsis   History of Present Illness:  Peter Yang is an 66 y.o. M who presented to the [facility] ED with a chief complaint of hematuria.  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  Sent to the emergency department SNF staff on 11/11.  At the emergency department he developed hypotension with a BP of 83/63. WBC 14.2, lactate 5.0 > 7.9.  Code sepsis was called.  Blood cultures and urine cultures were obtained.  Patient was given fluid bolus per sepsis guidelines.  Patient with complicated infectious history-started on meropenem and amoxicillin by [facility].  Urology was consulted for hematuria and concern for forniers.  Recommends monitoring with possible cystoscopy once Eliquis washout.  [facility] admitted patient.  PCCM was consulted for assistance with sepsis with high lactate.  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% BPH (hx prostate CA) SNF resident  Significant Hospital Events: Including procedures, antibiotic start and stop dates in addition to other pertinent events   11/11 Admit by TRH, BC>, UC>, PCCM consult, Mero and amoxicillin> 11/12 Started on peripheral Levophed for persistent hypotension, albumin 11/13 Troponin rising from 263 to 771, DVT/PE ruled out  Interim History / Subjective:  Tmax 98.4  Levophed weaned to 93mg's Urine clearing  Objective   Blood pressure 98/61, pulse 88, temperature 98.4 F (36.9 C), temperature source Oral, resp. rate 11, weight 90.9 kg, SpO2 100 %.        Intake/Output Summary (Last 24 hours) at 09/03/2022 0730 Last data  filed at 09/03/2022 0701 Gross per 24 hour  Intake 4118.46 ml  Output 2650 ml  Net 1468.46 ml   Filed Weights   09/01/22 0800  Weight: 90.9 kg   Examination: General: chronically ill appearing adult male lying in bed in NAD PSY: poor eye contact, flat, frustrated this am with his home situation   HEENT: MM pink/moist, anicteric  Neuro: AAOx4, speech clear, MAE CV: s1s2 RRR, no m/r/g PULM: non-labored at rest, lungs bilaterally clear  GI: soft, bsx4 active  Extremities: warm/dry, L BKA, no edema  Skin: scrotal edema/erythema  GU: purulent drainage at meatus   Resolved Hospital Problem list     Assessment & Plan:   Shock, suspected Septic from Urosepsis   Lactic acidosis, secondary to above Cellulitis of Scrotum Hx of ESBL E.Coli and carbapenem resistant proteus UTI on 6 week course of tx. Cortisol wnl. -wean levophed for SBP >90, note discharge in Oct with SBP in 80's  -continue meropenem, amoxicillin per ID  -continue midodrine TID  -await ECHO, BNP -DVT / PE negative on 11/13  -reduce NS to 742mhr -place PICC line for ABX, vasopressors, difficult lab draw  AKI BPH Hematuria Urology following, recommending outpatient cystoscopy -appreciate Urology > confirmed 11/14, plan to f/u with Urology for cystoscopy at discharge.  OK to trial voiding / cath removal if he is up ambulating independently.  -will need transport assistance to appt  -hold eliquis fo rnow  -continue proscar, flomax  -follow I/O's  -reduce IVF as above  -continue sodium bicarbonate tablets   Afib on Eliquis Chronic  HFrEF -eliquis on hold with hematuria -would not put patient back on Jardiance given UTI's  -await ECHO  -get SvO2 once PICC Placed  -tele monitoring   Perihepatic Ascites Newly noted on CT scan 11/13 -suspect in setting of poor nutritional state -LFT's wnl, if change or symptoms, consider abdominal US  -diuresis when BP permits   CAD HLD -hold ASA, plavix  -lipitor    DM2 -Hold home Metformin -Glucose range 193-262 -SSI, sensitive scale -Continue Levemir to 10 units daily -meal coverage added per TRH, 3 units TID  Hyperkalemia Hyponatremia Hypophosphatemia  -reduce NS to 21m/hr -NaPHOS on 11/14  -monitor lytes, replace as indicated   Chronic Stage 2 sacral ulcer -per WHoople(right click and "Reselect all SmartList Selections" daily)  Diet/type: Regular consistency (see orders) DVT prophylaxis: SCD's GI prophylaxis: PPI Lines: N/A, pending PICC placement Foley:  yes and it is still needed Code Status:  DNR. Confirmed with patient.  Critical care time: 363minutes    BNoe Gens MSN, APRN, NP-C, AGACNP-BC [facility] 09/03/2022, 8:30 AM   Please see [email or web address] for pager details.   From 7A-7P if no response, please call [phone] After hours, please call ELink [phone]

## 2022-09-03 NOTE — Progress Notes (Signed)
PROGRESS NOTE    Peter Yang  QQI:[id number] DOB: 1955/11/17 DOA: 08/31/2022 PCP: Clovia Cuff, MD   Brief Narrative:  This 66 years old male with PMH significant for CAD/NSTEMI, AAA, TIA, PAF on Eliquis, type 2 diabetes, hypertension, HFr EF from ischemic cardiomyopathy with LVEF 40 to 45%, recent COVID+, recently admitted at [facility] with gross hematuria and complicated UTI.  Cultures were positive for ESBL and Proteus treated with imipenem and amoxicillin. He presented in the ED with ongoing gross hematuria, he takes Eliquis for paroxysmal atrial fibrillation.  Urology is consulted for his scrotal erythema and cellulitis.  Urologist recommended continue Bactrim and imipenem, flushing of the catheter every 2 hours.  He may need cystoscopy for further evaluation.  Patient also became hypotensive requiring peripheral Levophed support.  PCCM is consulted.  Assessment & Plan:   Principal Problem:   Gross hematuria Active Problems:   Acute kidney injury ([place])   UTI (urinary tract infection)   Borderline low blood pressure determined by examination   Lactic acidosis   Diabetic polyneuropathy associated with type 2 diabetes mellitus (HCC)   Paroxysmal atrial fibrillation (HCC)   Chronic HFrEF (heart failure with reduced ejection fraction) (HCC)   Pressure ulcer of sacral region, stage 2 (HCC)   Dyslipidemia   Cellulitis of scrotum   Acute prostatitis   Septic shock (HCC)  Gross hematuria: > Resolved Patient presented with ongoing gross hematuria with clots. Urology was consulted and evaluated the patient. Urine has been clear after manual irrigation by urology. Continue manual bladder irrigation every 2 hours. Continue Flomax and Proscar. Hematuria resolved, H/H stable.  Acute kidney injury: > Resolved. Likely prerenal /ATN  vs postrenal/obstructive uropathy in the setting of gross hematuria with clots requiring Foley placement and irrigation. Baseline serum creatinine  normal.  Presented with creatinine 2.01 Avoid nephrotoxic medications, continue IV fluid hydration. Serum creatinine improving 2.01> 1.77>0.96  Septic shock secondary to UTI: Continue amoxicillin for ESBL. Started on imipenem for resistant Proteus UTI. He became hypotensive despite on midodrine. PCCM consulted, started on peripheral Levophed. Continue Levophed.  try to wean off as able to. PICC line inserted for IV access and blood draws.  Hypotension: Continue midodrine. Continue Levophed for now and try to wean off. Keep map above 65.  Lactic acidosis: Likely due to sepsis secondary to UTI. Continue IV fluid resuscitation.  Recheck lactic acid 2.7 Continue IV fluid hydration.  Type 2 diabetes with polyneuropathy: Hold metformin. Continue regular insulin sliding scale. Continue Levemir 10 units daily.  Chronic HFrEF: LVEF 40 to 45%. Appears hypovolemic.  Continue IV fluid resuscitation.  Paroxysmal atrial fibrillation: Heart rate is reasonably controlled.   Hold Eliquis due to gross hematuria with clots.  Pressure ulcer of sacral stage II Wound care consult.  Hyperlipidemia: Continue Lipitor  Cellulitis of the scrotum: Continue above antibiotics. Does not appear fourneir's gangrene. Continue fluconazole for antifungal coverage. Cellulitis has significantly improved.  Hyperkalemia: > Resolved. Lokelma given.  Potassium normalized.  Metabolic acidosis: Continue sodium bicarbonate tablets.  Hyponatremia: Could be secondary to poor hydration. Continue IV saline, recheck potassium  Elevated troponin: Troponin trended up 263> 777 Denies any chest pain, EKG : No ischemic changes. D-dimer elevated, CTA chest ruled out PE Could be demand ischemia in the setting of hypertension. Echocardiogram is pending.  BNP 3094 Does not appear in fluid overload.   DVT prophylaxis: SCDs Code Status: DNR Family Communication: No family at bedside Disposition  Plan:   Status is: Inpatient Remains inpatient appropriate because:  Admitted for gross hematuria and scrotal cellulitis. Patient underwent bladder irrigation.  Urology is consulted,  remains on IV imipenem and amoxicillin for history of ESBL and Proteus UTI.  Patient not medically clear, remained hypotensive requiring Levophed support.  Consultants:  Urology  Procedures: None Antimicrobials:  Anti-infectives (From admission, onward)    Start     Dose/Rate Route Frequency Ordered Stop   09/03/22 0800  meropenem (MERREM) 1 g in sodium chloride 0.9 % 100 mL IVPB        1 g 200 mL/hr over 30 Minutes Intravenous Every 8 hours 09/03/22 0201     09/01/22 1100  meropenem (MERREM) 1 g in sodium chloride 0.9 % 100 mL IVPB  Status:  Discontinued        1 g 200 mL/hr over 30 Minutes Intravenous Every 12 hours 09/01/22 0416 09/03/22 0201   09/01/22 1100  fluconazole (DIFLUCAN) IVPB 200 mg        200 mg 100 mL/hr over 60 Minutes Intravenous Every 24 hours 09/01/22 1020     09/01/22 0600  meropenem (MERREM) 1 g in sodium chloride 0.9 % 100 mL IVPB  Status:  Discontinued        1 g 200 mL/hr over 30 Minutes Intravenous Every 8 hours 08/31/22 2136 09/01/22 0416   08/31/22 2200  sulfamethoxazole-trimethoprim (BACTRIM DS) 800-160 MG per tablet 1 tablet  Status:  Discontinued        1 tablet Oral 2 times daily 08/31/22 2011 08/31/22 2129   08/31/22 2200  amoxicillin (AMOXIL) capsule 500 mg        500 mg Oral Every 8 hours 08/31/22 2131     08/31/22 2145  meropenem (MERREM) 1 g in sodium chloride 0.9 % 100 mL IVPB        1 g 200 mL/hr over 30 Minutes Intravenous  Once 08/31/22 2136 09/01/22 0820   08/31/22 1815  cefTRIAXone (ROCEPHIN) 1 g in sodium chloride 0.9 % 100 mL IVPB        1 g 200 mL/hr over 30 Minutes Intravenous  Once 08/31/22 1814 08/31/22 1911       Subjective: Patient was seen and examined at bedside. Overnight events noted. Patient reports doing better, he denies any pain,  urine is clear. Still has Indwelling Foley catheter, still remains on Levophed for being hypotensive. Patient is upset because he was not given sleeping medication last night.  Objective: Vitals:   09/03/22 0615 09/03/22 0630 09/03/22 0645 09/03/22 0800  BP: 1'03/61 99/62 98/61 '$ (!) 100/58  Pulse: 88 95 88 92  Resp: '16 20 11 20  '$ Temp:    98.4 F (36.9 C)  TempSrc:    Oral  SpO2: 97% 98% 100% 98%  Weight:        Intake/Output Summary (Last 24 hours) at 09/03/2022 1133 Last data filed at 09/03/2022 0800 Gross per 24 hour  Intake 4296.59 ml  Output 2650 ml  Net 1646.59 ml   Filed Weights   09/01/22 0800  Weight: 90.9 kg    Examination:  General exam: Appears comfortable, not in any acute distress, deconditioned. Respiratory system: CTA bilaterally, respiratory effort normal, RR 14 Cardiovascular system: S1-S2 heard, regular rate and rhythm, no murmur. Gastrointestinal system: Abdomen is soft, non tender, non distended, BS+ Central nervous system: Alert and oriented x 3. No focal neurological deficits. Extremities: Left BKA, no edema, no cyanosis, no clubbing. Skin: Scrotal erythema much improved, less swollen. Psychiatry: Judgement and insight appear normal. Mood & affect appropriate.  Data Reviewed: I have personally reviewed following labs and imaging studies  CBC: Recent Labs  Lab 08/31/22 1510 09/01/22 0040 09/03/22 0405  WBC 14.4* 15.0* 7.2  NEUTROABS 11.9*  --   --   HGB 9.5* 8.4* 8.1*  HCT 31.7* 29.1* 27.0*  MCV 88.5 93.3 87.9  PLT 287 209 518   Basic Metabolic Panel: Recent Labs  Lab 08/31/22 1511 09/01/22 0040 09/01/22 0717 09/01/22 1237 09/01/22 1623 09/01/22 2009 09/02/22 0005 09/02/22 1935 09/03/22 0111 09/03/22 0405  NA 134* 130*  --  128*  --   --   --  133*  --  133*  K 5.2* 5.6*   < > 4.8   < > 4.6 5.4* 4.2 4.0 4.1  CL 104 104  --  104  --   --   --  106  --  108  CO2 18* 13*  --  16*  --   --   --  19*  --  18*  GLUCOSE 209*  277*  --  263*  --   --   --  241*  --  252*  BUN 45* 45*  --  45*  --   --   --  32*  --  32*  CREATININE 2.07* 2.01*  --  1.77*  --   --   --  1.04  --  0.96  CALCIUM 9.1 8.4*  --  7.9*  --   --   --  7.9*  --  7.9*  MG  --   --   --   --   --   --   --   --   --  1.9  PHOS  --   --   --   --   --   --   --   --   --  2.3*   < > = values in this interval not displayed.   GFR: Estimated Creatinine Clearance: 94.2 mL/min (by C-G formula based on SCr of 0.96 mg/dL). Liver Function Tests: Recent Labs  Lab 08/31/22 1511  AST 23  ALT 15  ALKPHOS 104  BILITOT 1.0  PROT 7.9  ALBUMIN 3.7   No results for input(s): "LIPASE", "AMYLASE" in the last 168 hours. No results for input(s): "AMMONIA" in the last 168 hours. Coagulation Profile: Recent Labs  Lab 08/31/22 1511  INR 1.7*   Cardiac Enzymes: No results for input(s): "CKTOTAL", "CKMB", "CKMBINDEX", "TROPONINI" in the last 168 hours. BNP (last 3 results) No results for input(s): "PROBNP" in the last 8760 hours. HbA1C: No results for input(s): "HGBA1C" in the last 72 hours. CBG: Recent Labs  Lab 09/02/22 1204 09/02/22 1625 09/02/22 1945 09/02/22 2336 09/03/22 0736  GLUCAP 234* 212* 262* 216* 257*   Lipid Profile: No results for input(s): "CHOL", "HDL", "LDLCALC", "TRIG", "CHOLHDL", "LDLDIRECT" in the last 72 hours. Thyroid Function Tests: No results for input(s): "TSH", "T4TOTAL", "FREET4", "T3FREE", "THYROIDAB" in the last 72 hours. Anemia Panel: No results for input(s): "VITAMINB12", "FOLATE", "FERRITIN", "TIBC", "IRON", "RETICCTPCT" in the last 72 hours. Sepsis Labs: Recent Labs  Lab 09/01/22 0040 09/01/22 0245 09/01/22 1237 09/02/22 1935  PROCALCITON 0.54  --   --   --   LATICACIDVEN 7.2* 5.5* 2.8* 2.0*    Recent Results (from the past 240 hour(s))  Urine Culture     Status: None   Collection Time: 08/31/22  4:22 PM   Specimen: Urine, Clean Catch  Result Value Ref Range Status   Specimen Description  Final    URINE, CLEAN CATCH Performed at [facility], [place] [address] [place], [place] [address]    Special Requests   Final    NONE Performed at [facility], [place] [address] [place], [place] [address]    Culture   Final    NO GROWTH Performed at [facility], [place] [address] [place], [place] [address]    Report Status 09/02/2022 FINAL  Final  Blood culture (routine x 2)     Status: None (Preliminary result)   Collection Time: 08/31/22  6:15 PM   Specimen: BLOOD  Result Value Ref Range Status   Specimen Description   Final    BLOOD SITE NOT SPECIFIED Performed at [facility] [address], [place], [place] [address]    Special Requests   Final    BOTTLES DRAWN AEROBIC AND ANAEROBIC Blood Culture adequate volume Performed at [facility] [address], [employer], [place] [address]    Culture   Final    NO GROWTH 2 DAYS Performed at [facility], [place] [place], [place] [address]    Report Status PENDING  Incomplete  Blood culture (routine x 2)     Status: None (Preliminary result)   Collection Time: 08/31/22  6:30 PM   Specimen: BLOOD  Result Value Ref Range Status   Specimen Description   Final    BLOOD SITE NOT SPECIFIED Performed at [facility] [address], [place], [place] [address]    Special Requests   Final    BOTTLES DRAWN AEROBIC AND ANAEROBIC Blood Culture results may not be optimal due to an inadequate volume of blood received in culture bottles Performed at [facility] [address], [place], [place] [address]    Culture   Final    NO GROWTH 2 DAYS Performed at [facility] [address] [place], [place] [address]    Report Status PENDING  Incomplete  MRSA Next Gen by PCR, Nasal     Status: None   Collection Time: 09/01/22 12:00 AM   Specimen: Nasal Mucosa; Nasal Swab  Result Value Ref Range Status    MRSA by PCR Next Gen NOT DETECTED NOT DETECTED Final    Comment: (NOTE) The GeneXpert MRSA Assay (FDA approved for NASAL specimens only), is one component of a comprehensive MRSA colonization surveillance program. It is not intended to diagnose MRSA infection nor to guide or monitor treatment for MRSA infections. Test performance is not FDA approved in patients less than 50 years old. Performed at [facility], [place] [address] [place],  [address]          Radiology Studies: [place] EKG SITE RITE  Result Date: 09/03/2022 If Site Rite image not attached, placement could not be confirmed due to current cardiac rhythm.  CT Angio Chest Pulmonary Embolism (PE) W or WO Contrast  Result Date: 09/03/2022 CLINICAL DATA:  Pulmonary embolism suspected, with positive D-dimer, hypotension. History of diabetes, ischemic cardiomyopathy, COVID positive 08/02/2022 EXAM: CT ANGIOGRAPHY CHEST WITH CONTRAST TECHNIQUE: Multidetector CT imaging of the chest was performed using the standard protocol during bolus administration of intravenous contrast. Multiplanar CT image reconstructions and MIPs were obtained to evaluate the vascular anatomy. RADIATION DOSE REDUCTION: This exam was performed according to the departmental dose-optimization program which includes automated exposure control, adjustment of the mA and/or kV according to patient size and/or use of iterative reconstruction technique.  CONTRAST:  158m OMNIPAQUE IOHEXOL 350 MG/ML SOLN COMPARISON:  Portable chest today, portable chest and chest CT no contrast 08/02/2022, and chest CT without contrast 07/03/2022. FINDINGS: Cardiovascular: Overall borderline cardiomegaly again noted with mild biatrial chamber prominence. There are three-vessel coronary artery calcifications heaviest in the right and LAD arteries, moderate patchy calcific plaques in the aorta and scattered calcific plaques in the great vessels. The pulmonary trunk is  3.5 cm indicating arterial hypertension. No arterial embolus is seen through the segmental divisions. The subsegmental arterial bed is not evaluated due to breathing motion obscuring visualization of vessels. The superior pulmonary veins are slightly distended but no more than previously. There is no pericardial effusion. Mediastinum/Nodes: Slightly prominent right paratracheal and subcarinal lymph nodes up to 1.1 cm in short axis, noted previously and unchanged. There are calcified left hilar nodes. There are borderline prominent right hilar nodes, largest 9 mm in short axis. The thyroid gland, thoracic trachea, main airways, thoracic esophagus and axillary spaces are unremarkable. No further adenopathy. Lungs/Pleura: Small to moderate-sized bilateral symmetric layering pleural effusions are slightly improved compared with the prior CTs and are likely chronic. There is no pneumothorax. There is slight interstitial edema in the base of the lungs but this has improved. There is diffuse bronchial thickening which has increased. There is compressive atelectasis, less likely consolidation in the posterior lungs alongside the effusions. A calcified lingular granuloma is again noted. The aerated lungs anterior to the pleural effusions are generally clear. Upper Abdomen: Small amount of perihepatic ascites, new. Cholelithiasis is partially visible. Pancreatic atrophy. No other significant upper abdominal finding. Musculoskeletal: No chest wall abnormality. No acute or significant osseous findings. There are mild degenerative changes in the lower thoracic spine. Review of the MIP images confirms the above findings. IMPRESSION: 1. No evidence of arterial embolus through the segmental divisions. Subsegmental arterial bed obscured by breathing motion. 2. Borderline cardiomegaly with mild biatrial chamber prominence, slight distention of the superior pulmonary veins, and slight interstitial edema in the base of the lungs. 3.  Small to moderate-sized layering pleural effusions with compressive atelectasis, less likely consolidation in the posterior lungs alongside the effusions. 4. Diffuse bronchial thickening which could be due to bronchitis, reactive airways disease or fluid overload, interval increased since 08/02/2022. 5. Aortic and coronary artery atherosclerosis. 6. Slightly prominent mediastinal and right hilar nodes, unchanged. 7. Small amount of perihepatic ascites, new. 8. Cholelithiasis. Aortic Atherosclerosis (ICD10-I70.0). Electronically Signed   By: KTelford NabM.D.   On: 09/03/2022 00:21   DG CHEST PORT 1 VIEW  Result Date: 09/02/2022 CLINICAL DATA:  Heart failure with reduced ejection fraction. No current shortness of breath or chest pain. [id number] EXAM: PORTABLE CHEST 1 VIEW COMPARISON:  Chest CT without contrast and portable chest both dated 08/02/2022. FINDINGS: 5:56 p.m. Heart silhouette is normal in size. Coronary artery and aortic calcifications are again visible. There is central vascular prominence, mild interstitial edema in the lung bases bilateral small pleural effusions. There is haziness in the lower lung fields which could be atelectasis, pneumonitis or ground-glass edema. The mid and upper lung fields are clear. Similar findings were noted on the prior studies, although the interstitial edema was considerably greater previously. IMPRESSION: 1. Mild interstitial edema in the lung bases with small pleural effusions. 2. Haziness in the lower lung fields which could be atelectasis, pneumonitis or ground-glass edema. 3. Aortic atherosclerosis and coronary artery calcifications. 4. Compare: As above. Electronically Signed   By: KTelford NabM.D.   On: 09/02/2022  20:23   VAS [place] LOWER EXTREMITY VENOUS (DVT)  Result Date: 09/02/2022  Lower Venous DVT Study Patient Name:  ATTHEW COUTANT  Date of Exam:   09/02/2022 Medical Rec #: [id number]       Accession #:    [id number] Date of Birth: 24-Dec-1955         Patient Gender: M Patient Age:   43 years Exam Location:  [facility] Procedure:      VAS [place] LOWER EXTREMITY VENOUS (DVT) Referring Phys: Noe Gens --------------------------------------------------------------------------------  Indications: Swelling.  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 Limitations: Poor ultrasound/tissue interface and patient positioning. Comparison Study: No prior studies. Performing Technologist: Oliver Hum RVT  Examination Guidelines: A complete evaluation includes B-mode imaging, spectral Doppler, color Doppler, and power Doppler as needed of all accessible portions of each vessel. Bilateral testing is considered an integral part of a complete examination. Limited examinations for reoccurring indications may be performed as noted. The reflux portion of the exam is performed with the patient in reverse Trendelenburg.  +---------+---------------+---------+-----------+----------+--------------+ RIGHT    CompressibilityPhasicitySpontaneityPropertiesThrombus Aging +---------+---------------+---------+-----------+----------+--------------+ CFV      Full           Yes      Yes                                 +---------+---------------+---------+-----------+----------+--------------+ SFJ      Full                                                        +---------+---------------+---------+-----------+----------+--------------+ FV Prox  Full                                                        +---------+---------------+---------+-----------+----------+--------------+ FV Mid   Full                                                        +---------+---------------+---------+-----------+----------+--------------+ FV DistalFull                                                        +---------+---------------+---------+-----------+----------+--------------+ PFV      Full                                                         +---------+---------------+---------+-----------+----------+--------------+ POP      Full           Yes      Yes                                 +---------+---------------+---------+-----------+----------+--------------+  PTV      Full                                                        +---------+---------------+---------+-----------+----------+--------------+ PERO     Full                                                        +---------+---------------+---------+-----------+----------+--------------+   +---------+---------------+---------+-----------+----------+--------------+ LEFT     CompressibilityPhasicitySpontaneityPropertiesThrombus Aging +---------+---------------+---------+-----------+----------+--------------+ CFV      Full           Yes      Yes                                 +---------+---------------+---------+-----------+----------+--------------+ SFJ      Full                                                        +---------+---------------+---------+-----------+----------+--------------+ FV Prox  Full                                                        +---------+---------------+---------+-----------+----------+--------------+ FV Mid   Full                                                        +---------+---------------+---------+-----------+----------+--------------+ FV DistalFull                                                        +---------+---------------+---------+-----------+----------+--------------+ PFV      Full                                                        +---------+---------------+---------+-----------+----------+--------------+ POP      Full           Yes      Yes                                 +---------+---------------+---------+-----------+----------+--------------+ PTV  BKA             +---------+---------------+---------+-----------+----------+--------------+ PERO                                                  BKA            +---------+---------------+---------+-----------+----------+--------------+     Summary: RIGHT: - There is no evidence of deep vein thrombosis in the lower extremity. However, portions of this examination were limited- see technologist comments above.  - No cystic structure found in the popliteal fossa.  LEFT: - There is no evidence of deep vein thrombosis in the lower extremity. However, portions of this examination were limited- see technologist comments above.  - No cystic structure found in the popliteal fossa.  *See table(s) above for measurements and observations. Electronically signed by Servando Snare MD on 09/02/2022 at 6:04:34 PM.    Final     Scheduled Meds:  amoxicillin  500 mg Oral Q8H   atorvastatin  80 mg Oral Daily   Chlorhexidine Gluconate Cloth  6 each Topical Daily   finasteride  5 mg Oral Daily   insulin aspart  0-9 Units Subcutaneous TID AC & HS   insulin aspart  3 Units Subcutaneous TID WC   insulin detemir  10 Units Subcutaneous Daily   midodrine  10 mg Oral TID with meals   pantoprazole  40 mg Oral Daily   phosphorus  250 mg Oral TID   sodium bicarbonate  650 mg Oral TID   sodium chloride flush  10-40 mL Intracatheter Q12H   tamsulosin  0.4 mg Oral Daily   Continuous Infusions:  sodium chloride Stopped (09/03/22 0703)   fluconazole (DIFLUCAN) IV 200 mg (09/03/22 1051)   meropenem (MERREM) IV Stopped (09/03/22 0826)   norepinephrine (LEVOPHED) Adult infusion 2 mcg/min (09/03/22 0800)   sodium phosphate 15 mmol in dextrose 5 % 250 mL infusion 43 mL/hr at 09/03/22 0800     LOS: 2 days    Time spent: 50 mins    Rodgerick Gilliand, MD [facility]   If 7PM-7AM, please contact night-coverage

## 2022-09-03 NOTE — Progress Notes (Addendum)
Chart Review for ID Issues - from Care Everywhere  Admitted to [facility] 07/23/22 for gross hematuria, prostatitis, cystitis, AKI, acute urinary retention.    Per ID consult at [facility] during that admission: Continue with ertapenem for coverage for ESBL E. coli and amoxicillin for Proteus mirabilis intermediate to imipenem.  -The plan at discharge is to transition to Bactrim 1 double strength p.o. twice daily to complete 6-week course of treatment until September 03, 2022, and ID follow-up in the office in 2 weeks.       Noe Gens, MSN, APRN, NP-C, AGACNP-BC [facility] 09/03/2022, 10:45 AM   Please see [email or web address] for pager details.   From 7A-7P if no response, please call [phone] After hours, please call ELink [phone]

## 2022-09-03 NOTE — Progress Notes (Signed)
OT Cancellation Note  Patient Details Name: Peter Yang MRN: [id number] DOB: Jul 30, 1956   Cancelled Treatment:    Reason Eval/Treat Not Completed: Patient declined, no reason specified Patient refused to participate in therapy with education provided. Patient reported he needed to sleep.  OT to continue to follow and check back as schedule will allow.  Rennie Plowman, [facility] Office# [phone]  09/03/2022, 2:02 PM

## 2022-09-03 NOTE — TOC Initial Note (Signed)
Transition of Care [facility]) - Initial/Assessment Note    Patient Details  Name: Peter Yang MRN: [id number] Date of Birth: 1956-09-20  Transition [facility]) CM/SW Contact:    Dessa Phi, RN Phone Number: 09/03/2022, 3:20 PM  Clinical Narrative: Patient declines returning back to [facility] SNF-he wants home w/HHC-[facility] accepted for HHRN/PT/OT/aide/CSW(will need orders) PTAR @ d/c.                    Barriers to Discharge: Continued Medical Work up   Patient Goals and CMS Choice        Expected Discharge Plan and Services                                                Prior Living Arrangements/Services                       Activities of Daily Living      Permission Sought/Granted                  Emotional Assessment              Admission diagnosis:  Gross hematuria [R31.0] AKI (acute kidney injury) ([place]) [N17.9] Hydronephrosis, unspecified hydronephrosis type [N13.30] Patient Active Problem List   Diagnosis Date Noted   Hyponatremia 09/03/2022   Acute prostatitis 09/02/2022   Septic shock ([place]) 09/02/2022   Gross hematuria 08/31/2022   Lactic acidosis 08/31/2022   Cellulitis of scrotum 08/31/2022   Type 2 diabetes mellitus with diabetic peripheral angiopathy without gangrene, without long-term current use of insulin ([place])    Acute kidney injury ([place])    Impaired functional mobility, balance, gait, and endurance 08/03/2022   Obstructive sleep apnea syndrome 08/03/2022   Pleural effusion, bilateral 08/03/2022   Catheter-associated urinary tract infection ([place]) 08/03/2022   Lab test positive for detection of COVID-19 virus 08/03/2022   History of urinary retention 08/03/2022   Mitral valve regurgitation 08/03/2022   UTI (urinary tract infection) 08/02/2022   Thoracic ascending aortic aneurysm ([place]) 07/23/2022   Borderline low blood pressure determined by examination 07/04/2022   Hematuria 06/25/2022   History  of non-ST elevation myocardial infarction (NSTEMI) 06/15/2022   Paroxysmal atrial fibrillation ([place]) 06/15/2022   Dyslipidemia 06/15/2022   PVD (peripheral vascular disease) ([place]) 06/15/2022   Pressure ulcer of sacral region, stage 2 ([place]) 06/15/2022   Acute on chronic HFrEF (heart failure with reduced ejection fraction) ([place]) 06/15/2022   DNR (do not resuscitate) 06/15/2022   Ischemic cardiomyopathy 02/12/2022   Need for assistance with personal care 08/03/2021   Diabetic polyneuropathy associated with type 2 diabetes mellitus ([employer]) 07/28/2021   Prolonged QT interval 07/28/2021   Moderate protein-calorie malnutrition ([place]) 06/29/2021   Chronic insomnia 03/31/2021   BPH with obstruction/lower urinary tract symptoms 02/02/2018   Hyperlipidemia 04/15/2016   Vitamin D deficiency 04/09/2016   Essential hypertension 01/25/2016   PCP:  Clovia Cuff, MD Pharmacy:   [facility] [place], [place] - [address] [place] [address] Phone: [phone] Fax: [phone]  [facility] [address] [place] [address] Phone: [phone] Fax: [phone]  [id number] [place] - [address] [place] [address] Phone: [phone] Fax: [phone]     Social Determinants of Health (SDOH) Interventions    Readmission Risk Interventions    09/02/2022   10:36 AM  Readmission Risk Prevention Plan  Transportation Screening Complete  Medication Review (RN Care Manager) Complete  PCP or Specialist appointment within 3-5 days of discharge Complete  HRI or Home Care Consult Complete  SW Recovery Care/Counseling Consult Complete  [place] Complete

## 2022-09-03 NOTE — Consult Note (Signed)
Consultation Note Date: 09/03/2022   Patient Name: Peter Yang  DOB: 11-08-1955  MRN: [id number]  Age / Sex: 66 y.o., male  PCP: Clovia Cuff, MD Referring Physician: Shawna Clamp, MD  Reason for Consultation: Establishing goals of care  HPI/Patient Profile: 66 y.o. male  admitted on 08/31/2022    Clinical Assessment and Goals of Care: 66 year old gentleman, he is from [place] facility, admitted to the hospital because of hematuria.  Patient is 66 years old, he has past medical history of CAD/NSTEMI, AAA, TIA, PAF on Eliquis, diabetes, hypertension, heart failure with reduced ejection fraction left ventricular ejection fraction 40-45%, history of recent COVID. Patient admitted to hospital medicine service because of a complicated UTI and hematuria, cultures positive for ESBL, antibiotics adjusted, urology also consulted for scrotal erythema and cellulitis, remains on antibiotics, hospital course complicated by ongoing hypotension requiring peripheral Levophed support and PCCM also consulted, palliative medicine consult for ongoing goals of care discussions has been requested. Chart reviewed, palliative consult request noted  Medication history reviewed including home medications. Patient seen and examined.  See discussion below NEXT OF KIN  None as per patient.   SUMMARY OF RECOMMENDATIONS   Noted to be resting in bed.  He becomes irritated he states that he is trying to rest.  Briefly introduced myself as follows: Palliative medicine is specialized medical care for people living with serious illness. It focuses on providing relief from the symptoms and stress of a serious illness. The goal is to improve quality of life for both the patient and the family. Goals of care: Broad aims of medical therapy in relation to the patient's values and preferences. Our aim is to provide medical care  aimed at enabling patients to achieve the goals that matter most to them, given the circumstances of their particular medical situation and their constraints.  Thanked the patient for allowing me to have a very brief encounter.  The time of this initial palliative consultation.  Reviewed and rediscussed about DNR/DNI and reconfirmed with the patient.  Additional goals of care discussions undertaken, patient wishes to continue current mode of care.  Asked about next of kin and about whether or not he has advance care planning documents.  Patient states " I have no one."  He states he has nobody he wishes to designate as his healthcare power of attorney agent and states that he will make his medical decisions himself. 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  Medication history noted he has Tylenol that he has already been given.  Will resume his home dose of Neurontin for neuropathic pain, will give one-time dose of IV fentanyl for some additional relief. Agree with DNR, continue current mode of care Thank you for the consult. Code Status/Advance Care Planning: DNR   Symptom Management:     Palliative Prophylaxis:  Frequent Pain Assessment  Additional Recommendations (Limitations, Scope, Preferences): Full Scope Treatment  Psycho-social/Spiritual:  Desire for further Chaplaincy support:yes  Additional Recommendations: Caregiving  Support/Resources  Prognosis:  Unable to determine  Discharge Planning: To Be Determined      Primary Diagnoses: Present on Admission:  Gross hematuria  UTI (urinary tract infection)  Diabetic polyneuropathy associated with type 2 diabetes mellitus (HCC)  Paroxysmal atrial fibrillation (HCC)  Acute on chronic HFrEF (heart failure with reduced ejection fraction) (HCC)  Pressure ulcer of sacral region, stage 2 (HCC)  Dyslipidemia  Acute kidney injury ([place])  Borderline low blood pressure  determined by examination  Lactic acidosis  Cellulitis of scrotum   I have reviewed the medical record, interviewed the patient and family, and examined the patient. The following aspects are pertinent.  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 Overview:  Added automatically from request for surgery [id number] Formatting of this note might be different from the original. Added automatically from request for surgery [id number]   Large bowel perforation ([place]) 09/27/2015   Malignant neoplasm of prostate ([place]) 10/22/2019   Myopia with astigmatism and presbyopia, bilateral 06/15/2018   Last Assessment & Plan:  Formatting of this note might be different from the original. - Wrx printed Formatting of this note might be different from the original. 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 - Stable, monitor Formatting of this note might be different from the  original. Last Assessment & Plan:  - Stable, monitor   Retroperitoneal abscess ([place]) 10/02/2015   Status post corneal transplant 10/07/2016   Formatting of this note might be different from the original. Overview:  Added automatically from request for surgery [id number] Formatting of this note might be different from the original. Added automatically from request for surgery [id number]  Last Assessment & Plan:  Formatting of this note might be different from the original. - OD, 2/2 KCN - Stable, no NVK / rejection - Continue off steroids - Mo   Thoracic ascending aortic aneurysm ([place])    TIA (transient ischemic attack) 2012   Type 2 diabetes mellitus ([place])    Social History   Socioeconomic History   Marital status: Single    Spouse name: Not on file   Number of children: Not on file   Years of education: Not on file   Highest education level: Not on file  Occupational History   Not on file  Tobacco Use   Smoking status: Never   Smokeless tobacco: Never  Substance and Sexual Activity   Alcohol use: Never   Drug  use: Never   Sexual activity: Not on file  Other Topics Concern   Not on file  Social History Narrative   Not on file   Social Determinants of Health   Financial Resource Strain: Not on file  Food Insecurity: Not on file  Transportation Needs: Not on file  Physical Activity: Not on file  Stress: Not on file  Social Connections: Not on file   Family History  Problem Relation Age of Onset   Diabetes Mother    Hypertension Mother    Heart disease Mother    Cancer Father    Heart attack Sister    Cancer Sister    Diabetes Brother    Scheduled Meds:  amoxicillin  500 mg Oral Q8H   atorvastatin  80 mg Oral Daily   Chlorhexidine Gluconate Cloth  6 each Topical Daily   fentaNYL (SUBLIMAZE) injection  25 mcg Intravenous Once   finasteride  5 mg Oral Daily   furosemide  60 mg Intravenous Once   gabapentin  300 mg Oral QHS   insulin aspart  0-9 Units Subcutaneous TID AC  & HS   insulin aspart  3 Units Subcutaneous TID WC   insulin detemir  10 Units Subcutaneous Daily   midodrine  10 mg Oral TID with meals   pantoprazole  40 mg Oral Daily   phosphorus  250 mg Oral TID   sodium bicarbonate  650 mg Oral TID   sodium chloride flush  10-40 mL Intracatheter Q12H   tamsulosin  0.4 mg Oral Daily   Continuous Infusions:  sodium chloride Stopped (09/03/22 0703)   fluconazole (DIFLUCAN) IV Stopped (09/03/22 1151)   meropenem (MERREM) IV Stopped (09/03/22 0826)   norepinephrine (LEVOPHED) Adult infusion 2 mcg/min (09/03/22 0800)   PRN Meds:.acetaminophen **OR** acetaminophen, mouth rinse, perflutren lipid microspheres (DEFINITY) IV suspension, sodium chloride flush Medications Prior to Admission:  Prior to Admission medications   Medication Sig Start Date End Date Taking? Authorizing Provider  acetaminophen (TYLENOL) 325 MG tablet Take 2 tablets (650 mg total) by mouth every 6 (six) hours as needed for mild pain (or Fever >/= 101). 07/30/21  Yes Sheikh, Omair Latif, DO  Amino Acids-Protein Hydrolys (PRO-STAT AWC) LIQD Take 30 mLs by mouth 2 (two) times daily.   Yes [provider]  apixaban (ELIQUIS) 5 MG TABS tablet Take 1 tablet (5 mg total) by mouth 2 (two) times daily. 08/16/22 09/15/22 Yes Gerrit Heck, MD  aspirin 81 MG chewable tablet Chew 1 tablet (81 mg total) by mouth daily. 08/16/22  Yes Gerrit Heck, MD  atorvastatin (LIPITOR) 80 MG tablet Take 80 mg by mouth daily. 03/17/21  Yes [provider]  clopidogrel (PLAVIX) 75 MG tablet Take 1 tablet (75 mg total) by mouth daily. 08/16/22  Yes Gerrit Heck, MD  diclofenac Sodium (VOLTAREN) 1 % GEL Apply 2 g topically 2 (two) times daily as needed (pain). 07/20/21  Yes [provider]  empagliflozin (JARDIANCE) 10 MG TABS tablet Take 10 mg by mouth daily at 12 noon.   Yes [provider]  finasteride (PROSCAR) 5 MG tablet Take 1 tablet (5 mg total) by mouth daily.  06/30/22 09/28/22 Yes Pokhrel, Laxman, MD  gabapentin (NEURONTIN) 300 MG capsule Take 300 mg by mouth at bedtime. 07/20/21  Yes [provider]  Magnesium 500 MG TABS Take 500 mg by mouth at bedtime.   Yes [provider]  metFORMIN (GLUCOPHAGE) 500 MG tablet Take 1,000 mg by mouth 2 (two) times daily with  a meal.   Yes [provider]  midodrine (PROAMATINE) 10 MG tablet Take 10 mg by mouth 3 (three) times daily. 07/29/22  Yes [provider]  pantoprazole (PROTONIX) 40 MG tablet Take 1 tablet (40 mg total) by mouth daily. 06/30/22  Yes Pokhrel, Laxman, MD  potassium chloride SA (KLOR-CON M) 20 MEQ tablet Take 20 mEq by mouth daily.   Yes [provider]  Propylene Glycol (SYSTANE BALANCE) 0.6 % SOLN Place 1 drop into both eyes daily as needed.   Yes [provider]  saccharomyces boulardii (FLORASTOR) 250 MG capsule Take 250 mg by mouth 2 (two) times daily.   Yes [provider]  sulfamethoxazole-trimethoprim (BACTRIM DS) 800-160 MG tablet Take 1 tablet by mouth 2 (two) times daily for 18 days. 08/16/22 09/03/22 Yes Gerrit Heck, MD  tamsulosin (FLOMAX) 0.4 MG CAPS capsule Take 0.4 mg by mouth daily. 07/29/22  Yes [provider]   No Known Allergies Review of Systems Complains of pain by his stump area left lower extremity Physical Exam Patient has a BKA left lower extremity Becomes irritated, states that he is trying to rest Regular work of breathing Monitor noted Remains on pressors  Vital Signs: BP (!) 94/52   Pulse 90   Temp 97.9 F (36.6 C) (Axillary)   Resp 16   Wt 90.9 kg   SpO2 97%   BMI 24.39 kg/m  Pain Scale: 0-10   Pain Score: 0-No pain   SpO2: SpO2: 97 % O2 Device:SpO2: 97 % O2 Flow Rate: .   IO: Intake/output summary:  Intake/Output Summary (Last 24 hours) at 09/03/2022 1349 Last data filed at 09/03/2022 0800 Gross per 24 hour  Intake 4296.59 ml  Output 2650 ml  Net 1646.59 ml     LBM: Last BM Date : 09/01/22 Baseline Weight: Weight: 90.9 kg Most recent weight: Weight: 90.9 kg     Palliative Assessment/Data:   PPS 50%  Time In:  1255 Time Out:  1355 Time Total:  60  Greater than 50%  of this time was spent counseling and coordinating care related to the above assessment and plan.  Signed by: Loistine Chance, MD   Please contact Palliative Medicine Team phone at [phone] for questions and concerns.  For individual provider: See Shea Evans

## 2022-09-04 DIAGNOSIS — N410 Acute prostatitis: Secondary | ICD-10-CM | POA: Diagnosis not present

## 2022-09-04 DIAGNOSIS — R570 Cardiogenic shock: Secondary | ICD-10-CM | POA: Diagnosis not present

## 2022-09-04 DIAGNOSIS — R310 Gross hematuria: Secondary | ICD-10-CM | POA: Diagnosis not present

## 2022-09-04 DIAGNOSIS — N179 Acute kidney failure, unspecified: Secondary | ICD-10-CM | POA: Diagnosis not present

## 2022-09-04 DIAGNOSIS — I959 Hypotension, unspecified: Secondary | ICD-10-CM | POA: Diagnosis not present

## 2022-09-04 DIAGNOSIS — I2510 Atherosclerotic heart disease of native coronary artery without angina pectoris: Secondary | ICD-10-CM | POA: Diagnosis not present

## 2022-09-04 DIAGNOSIS — I5023 Acute on chronic systolic (congestive) heart failure: Secondary | ICD-10-CM | POA: Diagnosis not present

## 2022-09-04 DIAGNOSIS — I255 Ischemic cardiomyopathy: Secondary | ICD-10-CM | POA: Diagnosis not present

## 2022-09-04 LAB — COMPREHENSIVE METABOLIC PANEL
ALT: 124 U/L — ABNORMAL HIGH (ref 0–44)
AST: 69 U/L — ABNORMAL HIGH (ref 15–41)
Albumin: 2.4 g/dL — ABNORMAL LOW (ref 3.5–5.0)
Alkaline Phosphatase: 106 U/L (ref 38–126)
Anion gap: 6 (ref 5–15)
BUN: 27 mg/dL — ABNORMAL HIGH (ref 8–23)
CO2: 20 mmol/L — ABNORMAL LOW (ref 22–32)
Calcium: 7.8 mg/dL — ABNORMAL LOW (ref 8.9–10.3)
Chloride: 106 mmol/L (ref 98–111)
Creatinine, Ser: 0.9 mg/dL (ref 0.61–1.24)
GFR, Estimated: >60 mL/min (ref >60)
Glucose, Bld: 238 mg/dL — ABNORMAL HIGH (ref 70–99)
Potassium: 3.8 mmol/L (ref 3.5–5.1)
Sodium: 132 mmol/L — ABNORMAL LOW (ref 135–145)
Total Bilirubin: 0.6 mg/dL (ref 0.3–1.2)
Total Protein: 5.4 g/dL — ABNORMAL LOW (ref 6.5–8.1)

## 2022-09-04 LAB — HEPARIN LEVEL (UNFRACTIONATED)
Heparin Unfractionated: 0.11 IU/mL — ABNORMAL LOW (ref 0.30–0.70)
Heparin Unfractionated: 0.22 IU/mL — ABNORMAL LOW (ref 0.30–0.70)

## 2022-09-04 LAB — GLUCOSE, CAPILLARY
Glucose-Capillary: 224 mg/dL — ABNORMAL HIGH (ref 70–99)
Glucose-Capillary: 227 mg/dL — ABNORMAL HIGH (ref 70–99)
Glucose-Capillary: 214 mg/dL — ABNORMAL HIGH (ref 70–99)
Glucose-Capillary: 242 mg/dL — ABNORMAL HIGH (ref 70–99)

## 2022-09-04 LAB — CBC
HCT: 24.6 % — ABNORMAL LOW (ref 39.0–52.0)
Hemoglobin: 7.6 g/dL — ABNORMAL LOW (ref 13.0–17.0)
MCH: 26.7 pg (ref 26.0–34.0)
MCHC: 30.9 g/dL (ref 30.0–36.0)
MCV: 86.3 fL (ref 80.0–100.0)
Platelets: 169 10*3/uL (ref 150–400)
RBC: 2.85 MIL/uL — ABNORMAL LOW (ref 4.22–5.81)
RDW: 16.1 % — ABNORMAL HIGH (ref 11.5–15.5)
WBC: 5.9 10*3/uL (ref 4.0–10.5)
nRBC: 0 % (ref 0.0–0.2)

## 2022-09-04 LAB — MAGNESIUM: Magnesium: 2 mg/dL (ref 1.7–2.4)

## 2022-09-04 LAB — PROTIME-INR
INR: 1.3 — ABNORMAL HIGH (ref 0.8–1.2)
Prothrombin Time: 16.4 seconds — ABNORMAL HIGH (ref 11.4–15.2)

## 2022-09-04 LAB — COOXEMETRY PANEL
Carboxyhemoglobin: 1.6 % — ABNORMAL HIGH (ref 0.5–1.5)
Methemoglobin: 1.4 % (ref 0.0–1.5)
O2 Saturation: 62 %
Total hemoglobin: 8 g/dL — ABNORMAL LOW (ref 12.0–16.0)

## 2022-09-04 LAB — PHOSPHORUS: Phosphorus: 3 mg/dL (ref 2.5–4.6)

## 2022-09-04 LAB — APTT: aPTT: 32 seconds (ref 24–36)

## 2022-09-04 MED ORDER — INSULIN DETEMIR 100 UNIT/ML ~~LOC~~ SOLN
12.0000 [IU] | Freq: Two times a day (BID) | SUBCUTANEOUS | Status: DC
  Administered 2022-09-04 – 2022-09-05 (×3): 12 [IU] via SUBCUTANEOUS
  Filled 2022-09-04 (×3): qty 0.12

## 2022-09-04 MED ORDER — FUROSEMIDE 10 MG/ML IJ SOLN
INTRAMUSCULAR | Status: AC
  Filled 2022-09-04: qty 2

## 2022-09-04 MED ORDER — HEPARIN (PORCINE) 25000 UT/250ML-% IV SOLN
1350.0000 [IU]/h | INTRAVENOUS | Status: DC
  Administered 2022-09-04: 1350 [IU]/h via INTRAVENOUS
  Filled 2022-09-04: qty 250

## 2022-09-04 MED ORDER — ZOLPIDEM TARTRATE 5 MG PO TABS
5.0000 mg | ORAL_TABLET | Freq: Every evening | ORAL | Status: DC | PRN
  Administered 2022-09-04 – 2022-09-13 (×8): 5 mg via ORAL
  Filled 2022-09-04 (×9): qty 1

## 2022-09-04 MED ORDER — GABAPENTIN 300 MG PO CAPS
600.0000 mg | ORAL_CAPSULE | Freq: Every day | ORAL | Status: DC
  Administered 2022-09-04 – 2022-09-12 (×9): 600 mg via ORAL
  Filled 2022-09-04 (×9): qty 2

## 2022-09-04 MED ORDER — HEPARIN (PORCINE) 25000 UT/250ML-% IV SOLN
1400.0000 [IU]/h | INTRAVENOUS | Status: DC
  Administered 2022-09-06: 1400 [IU]/h via INTRAVENOUS
  Filled 2022-09-04 (×2): qty 250

## 2022-09-04 MED ORDER — MELATONIN 5 MG PO TABS
5.0000 mg | ORAL_TABLET | Freq: Every day | ORAL | Status: DC
  Administered 2022-09-04 – 2022-09-13 (×10): 5 mg via ORAL
  Filled 2022-09-04 (×10): qty 1

## 2022-09-04 MED ORDER — HEPARIN BOLUS VIA INFUSION
2000.0000 [IU] | Freq: Once | INTRAVENOUS | Status: AC
  Administered 2022-09-04: 2000 [IU] via INTRAVENOUS
  Filled 2022-09-04: qty 2000

## 2022-09-04 MED ORDER — TRAZODONE HCL 50 MG PO TABS: 50.0000 mg | ORAL_TABLET | Freq: Every evening | ORAL | Status: DC | PRN

## 2022-09-04 MED ORDER — FUROSEMIDE 10 MG/ML IJ SOLN
60.0000 mg | Freq: Two times a day (BID) | INTRAMUSCULAR | Status: DC
  Administered 2022-09-04 – 2022-09-05 (×3): 60 mg via INTRAVENOUS
  Filled 2022-09-04 (×3): qty 6

## 2022-09-04 NOTE — Progress Notes (Signed)
[facility] for Infectious Disease  Date of Admission:  08/31/2022     Total days of antibiotics 5         ASSESSMENT:  [name] remains on vasopressors and is afebrile with no urinary symptoms.  Hematuria remains resolved. Given radiographic evidence concern remains for persistent infection although previous imaging in October of the prostate was unremarkable. Discussed care options to include watchful waiting, a secondary course of sulfamethoxazole-trimethoprim, or the preferred course of meropenem/ertapenem for at least 4 weeks. Unfortunately we have not been able to obtain the culture results that showed ESBL E. Coli and Proteus. He is open to going to a rehab facility if needed depending on the facility and remains uncomfortable of doing home antibiotic therapy. For now will continue current dose of meropenem and fluconazole pending disposition.  Remaining medical and supportive care per Critical Care.   PLAN:  Continue meropenem. Await evaluation for disposition.  Remaining medical and supportive care per Critical Care.   Principal Problem:   Gross hematuria Active Problems:   Diabetic polyneuropathy associated with type 2 diabetes mellitus (HCC)   Paroxysmal atrial fibrillation (HCC)   Dyslipidemia   Pressure ulcer of sacral region, stage 2 (HCC)   Acute on chronic HFrEF (heart failure with reduced ejection fraction) (HCC)   UTI (urinary tract infection)   Borderline low blood pressure determined by examination   Acute kidney injury ([place])   Lactic acidosis   Cellulitis of scrotum   Acute prostatitis   Septic shock (HCC)   Hyponatremia    atorvastatin  80 mg Oral Daily   Chlorhexidine Gluconate Cloth  6 each Topical Daily   finasteride  5 mg Oral Daily   furosemide  60 mg Intravenous BID   gabapentin  600 mg Oral QHS   insulin aspart  0-9 Units Subcutaneous TID AC & HS   insulin aspart  3 Units Subcutaneous TID WC   insulin detemir  12 Units Subcutaneous BID    melatonin  5 mg Oral QHS   midodrine  10 mg Oral TID with meals   pantoprazole  40 mg Oral Daily   phosphorus  250 mg Oral TID   sodium bicarbonate  650 mg Oral TID   sodium chloride flush  10-40 mL Intracatheter Q12H   tamsulosin  0.4 mg Oral Daily    SUBJECTIVE:  Afebrile overnight with no acute events. On vasopressors. Slept about an hour.   No Known Allergies   Review of Systems: Review of Systems  Constitutional:  Negative for chills, fever, malaise/fatigue and weight loss.  Respiratory:  Negative for cough, shortness of breath and wheezing.   Cardiovascular:  Negative for chest pain and leg swelling.  Gastrointestinal:  Negative for abdominal pain, constipation, diarrhea, nausea and vomiting.  Skin:  Negative for rash.  Neurological:  Negative for weakness and headaches.  Endo/Heme/Allergies:  Does not bruise/bleed easily.      OBJECTIVE: Vitals:   09/04/22 1000 09/04/22 1015 09/04/22 1030 09/04/22 1045  BP: (!) 91/53 (!) 94/59 99/64 (!) 104/59  Pulse: (!) 52 (!) 53 (!) 52 (!) 52  Resp: '14 15 16 10  '$ Temp:      TempSrc:      SpO2: 99% 99% 98% 100%  Weight:      Height:       Body mass index is 27.37 kg/m.  Physical Exam Constitutional:      General: He is not in acute distress.    Appearance: He is well-developed.  Cardiovascular:     Rate and Rhythm: Normal rate and regular rhythm.     Heart sounds: Normal heart sounds.  Pulmonary:     Effort: Pulmonary effort is normal.     Breath sounds: Normal breath sounds.  Genitourinary:    Comments: Dark yellow urine in catheter.  Skin:    General: Skin is warm and dry.  Neurological:     Mental Status: He is alert and oriented to person, place, and time.  Psychiatric:        Mood and Affect: Mood normal.     Lab Results Lab Results  Component Value Date   WBC 5.9 09/04/2022   HGB 7.6 (L) 09/04/2022   HCT 24.6 (L) 09/04/2022   MCV 86.3 09/04/2022   PLT 169 09/04/2022    Lab Results  Component  Value Date   CREATININE 0.90 09/04/2022   BUN 27 (H) 09/04/2022   NA 132 (L) 09/04/2022   K 3.8 09/04/2022   CL 106 09/04/2022   CO2 20 (L) 09/04/2022    Lab Results  Component Value Date   ALT 124 (H) 09/04/2022   AST 69 (H) 09/04/2022   ALKPHOS 106 09/04/2022   BILITOT 0.6 09/04/2022     Microbiology: Recent Results (from the past 240 hour(s))  Urine Culture     Status: None   Collection Time: 08/31/22  4:22 PM   Specimen: Urine, Clean Catch  Result Value Ref Range Status   Specimen Description   Final    URINE, CLEAN CATCH Performed at [facility], [place] [address], [place], [place] [address]    Special Requests   Final    NONE Performed at [facility], [place] [address] [place], [place] [address]    Culture   Final    NO GROWTH Performed at [facility], [place] [address] [place], [place] [address]    Report Status 09/02/2022 FINAL  Final  Blood culture (routine x 2)     Status: None (Preliminary result)   Collection Time: 08/31/22  6:15 PM   Specimen: BLOOD  Result Value Ref Range Status   Specimen Description   Final    BLOOD SITE NOT SPECIFIED Performed at [facility] [address] [place], [place] [address]    Special Requests   Final    BOTTLES DRAWN AEROBIC AND ANAEROBIC Blood Culture adequate volume Performed at [facility] [address], [place], [place] [address]    Culture   Final    NO GROWTH 2 DAYS Performed at [facility], [place] [place], [place] [address]    Report Status PENDING  Incomplete  Blood culture (routine x 2)     Status: None (Preliminary result)   Collection Time: 08/31/22  6:30 PM   Specimen: BLOOD  Result Value Ref Range Status   Specimen Description   Final    BLOOD SITE NOT SPECIFIED Performed at [facility] [address] [place], [place] [address]    Special Requests   Final    BOTTLES DRAWN  AEROBIC AND ANAEROBIC Blood Culture results may not be optimal due to an inadequate volume of blood received in culture bottles Performed at [facility] [address] [place], [place] [address]    Culture   Final    NO GROWTH 3 DAYS Performed at [facility], [place] [address], [place], [place] [address]    Report Status  PENDING  Incomplete  MRSA Next Gen by PCR, Nasal     Status: None   Collection Time: 09/01/22 12:00 AM   Specimen: Nasal Mucosa; Nasal Swab  Result Value Ref Range Status   MRSA by PCR Next Gen NOT DETECTED NOT DETECTED Final    Comment: (NOTE) The GeneXpert MRSA Assay (FDA approved for NASAL specimens only), is one component of a comprehensive MRSA colonization surveillance program. It is not intended to diagnose MRSA infection nor to guide or monitor treatment for MRSA infections. Test performance is not FDA approved in patients less than 55 years old. Performed at [facility], [place] [address] [place], [place] [address]      Terri Piedra, [facility]  09/04/2022  12:20 PM

## 2022-09-04 NOTE — Progress Notes (Incomplete)
PHARMACY CONSULT NOTE FOR:  OUTPATIENT  PARENTERAL ANTIBIOTIC THERAPY (OPAT)  Informational only as the plan is for the patient to discharge to SNF and receive antibiotics there  Indication: Prostatitis (prior hx ESBL) Regimen: Ertapenem 1g IV every 24 hours End date: 09/29/22  IV antibiotic discharge orders are pended. To discharging provider:  please sign these orders via discharge navigator,  Select New Orders & click on the button choice - Manage This Unsigned Work.    Thank you for allowing pharmacy to be a part of this patient's care.  Alycia Rossetti, PharmD, BCPS Infectious Diseases Clinical Pharmacist 09/05/2022 1:52 PM   **Pharmacist phone directory can now be found on [email or web address] ([email or web address]).  Listed under [place].

## 2022-09-04 NOTE — Progress Notes (Signed)
ANTICOAGULATION CONSULT NOTE - Initial Consult  Pharmacy Consult for heparin Indication: atrial fibrillation  No Known Allergies  Patient Measurements: Height: '6\' 4"'$  (193 cm) Weight: 102 kg (224 lb 13.9 oz) IBW/kg (Calculated) : 86.8 Heparin Dosing Weight: 95 kg  Vital Signs: Temp: 98.1 F (36.7 C) (11/15 0800) Temp Source: Oral (11/15 0800) BP: 104/59 (11/15 1045) Pulse Rate: 52 (11/15 1045)  Labs: Recent Labs    09/02/22 1935 09/03/22 0405 09/03/22 1802 09/04/22 0604  HGB  --  8.1*  --  7.6*  HCT  --  27.0*  --  24.6*  PLT  --  205  --  169  CREATININE 1.04 0.96 0.97 0.90  TROPONINIHS 771*  --   --   --     Estimated Creatinine Clearance: 100.5 mL/min (by C-G formula based on SCr of 0.9 mg/dL).   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   Coronary artery disease involving native coronary artery of native heart without angina pectoris 06/25/2022   Diabetic polyneuropathy associated with type 2 diabetes mellitus ([place]) 07/28/2021   Essential hypertension 01/25/2016   Hematuria and +fecal occult  06/25/2022   History of diabetic ulcer of foot 12/31/2019   History of non-ST elevation myocardial infarction (NSTEMI) 06/15/2022   Hx of right coronary artery stent placement 02/11/2022   Hyperlipidemia    Ischemic cardiomyopathy    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 Overview:  Added automatically from request for surgery [id number] Formatting of this note might be different from the original. Added automatically from request for surgery [id number]   Large bowel perforation ([place]) 09/27/2015   Malignant neoplasm of prostate ([place]) 10/22/2019   Myopia with astigmatism and presbyopia, bilateral 06/15/2018   Last Assessment & Plan:  Formatting of this note might be  different from the original. - Wrx printed Formatting of this note might be different from the original. 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 - Stable, monitor Formatting of this note might be different from the original. Last Assessment & Plan:  - Stable, monitor   Retroperitoneal abscess ([place]) 10/02/2015   Status post corneal transplant 10/07/2016   Formatting of this note might be different from the original. Overview:  Added automatically from request for surgery [id number] Formatting of this note might be different from the original. Added automatically from request for surgery [id number]  Last Assessment & Plan:  Formatting of this note might be different from the original. - OD, 2/2 KCN - Stable, no NVK / rejection - Continue off steroids - Mo   Thoracic ascending aortic aneurysm ([place])    TIA (transient ischemic attack) 2012   Type 2 diabetes mellitus [facility])       Assessment: 66 year old male presented with hematuria of unclear duration. Patient on Eliquis PTA for afib, as well as Plavix for CAD (cath in August 2023, no good targets for PCI - CARDs recommendation at that time for Eliquis +  Plavix, stop aspirin). Per med history completed with SNF MAR, it appears patient may have been on triple therapy since last admission. Anticoagulation was held on admission for hematuria, which has since cleared. Pharmacy consulted to start heparin infusion to re-challenge anticoagulation prior to resuming Eliquis.   Goal of Therapy:  Heparin level 0.3-0.7 units/ml Monitor platelets by anticoagulation protocol: Yes   Plan:  -Heparin 2000 unit bolus (reduced in setting of  recent hematuria) -Start heparin infusion at 1350 units/hr -Check heparin level ~ 6 hrs after start of infusion -Baseline labs pending but should be able to follow heparin level given last dose of Eliquis 11/11 and adequate renal function -Daily CBC while on heparin infusion -Monitor closely for any signs/symptoms of bleeding, especially recurrence of hematuria  Tawnya Crook, PharmD, BCPS Clinical Pharmacist 09/04/2022 11:48 AM
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
## 2022-09-04 NOTE — Care Plan (Signed)
Patient remains in shock requiring dobutamine and Levophed support.   Patient remains under ICU care.  We will sign off.

## 2022-09-04 NOTE — Progress Notes (Signed)
ANTICOAGULATION CONSULT NOTE - Initial Consult  Pharmacy Consult for heparin Indication: atrial fibrillation  No Known Allergies  Patient Measurements: Height: '6\' 4"'$  (193 cm) Weight: 102 kg (224 lb 13.9 oz) IBW/kg (Calculated) : 86.8 Heparin Dosing Weight: 95 kg  Vital Signs: Temp: 98.2 F (36.8 C) (11/15 1237) Temp Source: Oral (11/15 1237) BP: 94/53 (11/15 1845) Pulse Rate: 95 (11/15 1845)  Labs: Recent Labs    09/02/22 1935 09/03/22 0405 09/03/22 1802 09/04/22 0604 09/04/22 1221 09/04/22 1830  HGB  --  8.1*  --  7.6*  --   --   HCT  --  27.0*  --  24.6*  --   --   PLT  --  205  --  169  --   --   APTT  --   --   --   --  32  --   LABPROT  --   --   --   --  16.4*  --   INR  --   --   --   --  1.3*  --   HEPARINUNFRC  --   --   --   --  0.11* 0.22*  CREATININE 1.04 0.96 0.97 0.90  --   --   TROPONINIHS 771*  --   --   --   --   --      Estimated Creatinine Clearance: 100.5 mL/min (by C-G formula based on SCr of 0.9 mg/dL).   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   Coronary artery disease involving native coronary artery of native heart without angina pectoris 06/25/2022   Diabetic polyneuropathy associated with type 2 diabetes mellitus ([place]) 07/28/2021   Essential hypertension 01/25/2016   Hematuria and +fecal occult  06/25/2022   History of diabetic ulcer of foot 12/31/2019   History of non-ST elevation myocardial infarction (NSTEMI) 06/15/2022   Hx of right coronary artery stent placement 02/11/2022   Hyperlipidemia    Ischemic cardiomyopathy    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 Overview:  Added automatically from request for surgery [id number] Formatting of this note might be different from the original. Added automatically from  request for surgery [id number]   Large bowel perforation ([place]) 09/27/2015   Malignant neoplasm of prostate ([place]) 10/22/2019   Myopia with astigmatism and presbyopia, bilateral 06/15/2018   Last Assessment & Plan:  Formatting of this note might be different from the original. - Wrx printed Formatting of this note might be different from the original. 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 - Stable, monitor Formatting of this note might be different from the original. Last Assessment & Plan:  - Stable, monitor   Retroperitoneal abscess ([place]) 10/02/2015   Status post corneal transplant 10/07/2016   Formatting of this note might be different from the original. Overview:  Added automatically from request for surgery [id number] Formatting of this note might be different from the original. Added automatically from  request for surgery [id number]  Last Assessment & Plan:  Formatting of this note might be different from the original. - OD, 2/2 KCN - Stable, no NVK / rejection - Continue off steroids - Mo   Thoracic ascending aortic aneurysm (HCC)    TIA (transient ischemic attack) 2012   Type 2 diabetes mellitus [facility])       Assessment: 66 year old male presented with hematuria of unclear duration. Patient on Eliquis PTA for afib, as well as Plavix for CAD (cath in August 2023, no good targets for PCI - CARDs recommendation at that time for Eliquis + Plavix, stop aspirin). Per med history completed with SNF MAR, it appears patient may have been on triple therapy since last admission. Anticoagulation was held on admission for hematuria, which has since cleared. Pharmacy consulted to start  heparin infusion to re-challenge anticoagulation prior to resuming Eliquis.  Today, 09/04/22 18:30 1st heparin level 0.22, subtherapeutic, with heparin infusing at 1350 units/hr Per nurse: Heparin infusion interruption from 1250 - 1358 due to loss of IV access No bleeding   Goal of Therapy:  Heparin level 0.3-0.7 units/ml Monitor platelets by anticoagulation protocol: Yes   Plan:  -Increase heparin infusion rate to 1400 units/hr -Check heparin level ~ 6 hrs after change in rate -Daily heparin level and CBC while on heparin infusion -Monitor closely for any signs/symptoms of bleeding, especially recurrence of hematuria  Suzzanne Cloud, PharmD, BCPS Clinical Pharmacist 09/04/2022 7:05 PM

## 2022-09-04 NOTE — Progress Notes (Signed)
NAME:  Peter Yang, MRN:  [id number], DOB:  [birth year], LOS: 3 ADMISSION DATE:  08/31/2022, CONSULTATION DATE:  11/11 REFERRING MD:  Lelon Mast FOR CONSULT: sepsis   History of Present Illness:  Brenda Cowher is an 66 y.o. M who presented to the [facility] ED with a chief complaint of hematuria.  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  Sent to the emergency department SNF staff on 11/11.  At the emergency department he developed hypotension with a BP of 83/63. WBC 14.2, lactate 5.0 > 7.9.  Code sepsis was called.  Blood cultures and urine cultures were obtained.  Patient was given fluid bolus per sepsis guidelines.  Patient with complicated infectious history-started on meropenem and amoxicillin by TRH.  Urology was consulted for hematuria and concern for forniers.  Recommends monitoring with possible cystoscopy once Eliquis washout.  [facility] admitted patient.  PCCM was consulted for assistance with sepsis with high lactate.  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% BPH (hx prostate CA) SNF resident  Significant Hospital Events: Including procedures, antibiotic start and stop dates in addition to other pertinent events   11/11 Admit by TRH, BC>, UC>, PCCM consult, Mero and amoxicillin> 11/12 Started on peripheral Levophed for persistent hypotension, albumin 11/13 Troponin rising from 263 to 771, DVT/PE ruled out  Interim History / Subjective:  Overnight no acute events.  Objective   Blood pressure (!) 98/58, pulse 95, temperature 98.1 F (36.7 C), temperature source Oral, resp. rate 15, height '6\' 4"'$  (1.93 m), weight 102 kg, SpO2 99 %.        Intake/Output Summary (Last 24 hours) at 09/04/2022 0806 Last data  filed at 09/04/2022 0726 Gross per 24 hour  Intake 1489.8 ml  Output 3550 ml  Net -2060.2 ml    Filed Weights   09/01/22 0800 09/02/22 0315 09/04/22 0530  Weight: 90.9 kg 94.8 kg 102 kg   Examination: General: chronically ill appearing man lying in bed in NAD  HEENT: [place]/AT, eyes anicteric Neuro: awake, alert, moving all extremities. Answering questions appropriately Psych: flat affect, slightly slow to answer questions CV: S1S2, RRR PULM: breathing comfortably on RA, CTAB anteriorly, reduced basilar breath sounds  GI: soft, NT Extremities: no RLE edema, L BKA  Derm: warm, dry   I/O -1.8L (+3L for admission)  CVP 17 Coox 62% Na+ 132 BUN 27 Cr 0.9 AST  69 Alt 124 WBC 5.9 H/H 7.6/24.6  Resolved Hospital Problem list   hypophosphatemia  Assessment & Plan:   Shock, initially suspected Septic from UTI, now looking more likely cardiogenic shock. All cultures have remained negative this admission and he has been on antibiotics for weeks pre-dating this. Acute drop in EF, low coox. Acute on chronic HFrEF, ICM, CAD with stent in 05/2022 Lactic acidosis, secondary to above -daily coox; con't PICC line -con't dobutamine -cardiology consulting today -monitor on tele -monitor electrolytes, replete PRN -statin   Chronic UTI-- ESBL E Coli and CRE Proteus in the past (diagnosed at [facility]) Prostatitis Scrotal cellulitis -Meropenem and fluconazole per ID recommendations. -Appreciate urolgoy and ID's management -Foley to remain in place until outpatient follow up with urology or can do voiding trials IF up and moving around in ICU   Hematuria  due to UTI Known prostate cancer, under surveillance with urology -holding eliquis, will trial resuming heparin gtt and monitor for recurrence -con't holding DAPT; hopeful to resume clopidogrel soon  -cystoscopy eventually needed per Urology -flomax, finasteride   Hyperglycemia, uncontrolled -levemir 12 units BID -con't mealtime  insulin -SSI PRN -goal BG 140-180   Hyponatremia, hypervolemic Ascites, bilateraly pleural effusions -con't diuresis   Deconditioning -PT, OT -has prosthesis at bedside and can do OOB mobility  Chronic Stage 2 sacral ulcer -wound care  Insomnia, phantom limb pain LLE -increase nightly gabapentin dose -melatonin QHS -trazodone PRN if other 2 meds don't work  No surrogate decision maker-- I asked him again today. Appreciate Palliative care team's input.   Best Practice (right click and "Reselect all SmartList Selections" daily)  Diet/type: Regular consistency  DVT prophylaxis: SCD's, systemic heparin GI prophylaxis: PPI- home med Lines: PICC Foley:  yes and it is still needed Code Status:  DNR.    Critical care time:      This patient is critically ill with multiple organ system failure which requires frequent high complexity decision making, assessment, support, evaluation, and titration of therapies. This was completed through the application of advanced monitoring technologies and extensive interpretation of multiple databases. During this encounter critical care time was devoted to patient care services described in this note for 40 minutes.  Julian Hy, DO 09/04/22 10:18 AM [facility]

## 2022-09-04 NOTE — Progress Notes (Signed)
OT Cancellation Note  Patient Details Name: Tyrez Berrios MRN: [id number] DOB: 08/16/1956   Cancelled Treatment:    Reason Eval/Treat Not Completed: Medical issues which prohibited therapy Rennie Plowman, MS Acute Rehabilitation Department Office# [phone]  09/04/2022, 2:47 PM

## 2022-09-04 NOTE — Progress Notes (Signed)
PT Cancellation Note  Patient Details Name: Peter Yang MRN: [id number] DOB: 06/28/1956   Cancelled Treatment:    Reason Eval/Treat Not Completed: Medical issues which prohibited therapy [place] Office [phone] Weekend [phone]   Claretha Cooper 09/04/2022, 2:37 PM

## 2022-09-04 NOTE — Progress Notes (Signed)
PROGRESS NOTE    Peter Yang  YQI:[id number] DOB: [birth year] DOA: 08/31/2022 PCP: Clovia Cuff, MD   Brief Narrative:  This 66 years old male with PMH significant for CAD/NSTEMI, AAA, TIA, PAF on Eliquis, type 2 diabetes, hypertension, HFr EF from ischemic cardiomyopathy with LVEF 40 to 45%, recent COVID+, recently admitted at [facility] with gross hematuria and complicated UTI.  Cultures were positive for ESBL and Proteus treated with imipenem and amoxicillin. He presented in the ED with ongoing gross hematuria, he takes Eliquis for paroxysmal atrial fibrillation.  Urology was consulted for his scrotal erythema and cellulitis.  Urologist recommended continue amoxicillin and imipenem, flushing of the catheter every 2 hours.  He may need cystoscopy for further evaluation.  Patient also became hypotensive requiring peripheral Levophed support.  PCCM is consulted.  Echocardiogram shows LVEF 35 to 40% consistent with cardiogenic shock.  Cardiology is consulted.  Patient started on dobutamine infusion.  Assessment & Plan:   Principal Problem:   Gross hematuria Active Problems:   Acute kidney injury ([place])   UTI (urinary tract infection)   Borderline low blood pressure determined by examination   Lactic acidosis   Diabetic polyneuropathy associated with type 2 diabetes mellitus (HCC)   Paroxysmal atrial fibrillation (HCC)   Acute on chronic HFrEF (heart failure with reduced ejection fraction) (HCC)   Pressure ulcer of sacral region, stage 2 (HCC)   Dyslipidemia   Cellulitis of scrotum   Acute prostatitis   Septic shock (HCC)   Hyponatremia  Gross hematuria: > Resolved Patient presented with ongoing gross hematuria with clots. Urology was consulted and evaluated the patient. Urine has been clear after manual irrigation by urology. Continue Flomax and Proscar. Hematuria resolved, H/H stable.  Acute kidney injury: > Resolved. Baseline serum creatinine normal.  Presented with  creatinine 2.01 Avoid nephrotoxic medications, continue IV fluid hydration. Serum creatinine improving 2.01> 1.77>0.96  Shock > Multifactorial. Shock was initially suspected sepsis from UTI, Continued on imipenem and amoxicillin for Proteus and ESBL. He became hypotensive despite on midodrine. PCCM consulted, started on peripheral Levophed. All cultures have remained negative. Later Echo shows decreasing LVEF 35 to 40%. Patient is started on dobutamine infusion.  Cardiology consulted.  Hypotension: Continue midodrine. Continue Levophed for now and try to wean off. Keep map above 65.  Lactic acidosis: Likely due to sepsis secondary to UTI. Continue IV fluid resuscitation.  Recheck lactic acid 2.7 Continue IV fluid hydration.  Type 2 diabetes with polyneuropathy: Hold metformin. Continue regular insulin sliding scale. Continue Levemir 10 units daily.  Chronic HFrEF: LVEF 40 to 45%. Appears hypovolemic.  Continue IV fluid resuscitation. Repeat echocardiogram shows LVEF 35 to 40%.  Paroxysmal atrial fibrillation: Heart rate is reasonably controlled.   Hold Eliquis due to gross hematuria with clots.  Pressure ulcer of sacral stage II Wound care consult.  Hyperlipidemia: Continue Lipitor  Cellulitis of the scrotum: Continue above antibiotics. Does not appear fourneir's gangrene. Continue fluconazole for antifungal coverage. Cellulitis has significantly improved.  Hyperkalemia: > Resolved. Lokelma given.  Potassium normalized.  Metabolic acidosis: Continue sodium bicarbonate tablets.  Hyponatremia: Could be secondary to poor hydration. Continue IV saline, recheck potassium  Elevated troponin: Troponin trended up 263> 777 Denies any chest pain, EKG : No ischemic changes. D-dimer elevated, CTA chest ruled out PE Could be demand ischemia in the setting of hypertension. BNP 3094 Does not appear in fluid overload. Echo shows decreasing LVEF 35 to 40%.  Cardiology  is consulted   DVT prophylaxis: SCDs Code  Status: DNR Family Communication: No family at bedside Disposition Plan:   Status is: Inpatient Remains inpatient appropriate because:    Admitted for gross hematuria and scrotal cellulitis. Patient underwent bladder irrigation.  Urology is consulted,  remains on IV imipenem and amoxicillin for history of ESBL and Proteus UTI.  Patient not medically clear, remained hypotensive requiring Levophed support.  Patient is moved to ICU as patient is requiring dobutamine infusion, Levophed.  Consultants:  Urology Cardiology PCCM  Procedures: None Antimicrobials:  Anti-infectives (From admission, onward)    Start     Dose/Rate Route Frequency Ordered Stop   09/03/22 0800  meropenem (MERREM) 1 g in sodium chloride 0.9 % 100 mL IVPB        1 g 200 mL/hr over 30 Minutes Intravenous Every 8 hours 09/03/22 0201     09/01/22 1100  meropenem (MERREM) 1 g in sodium chloride 0.9 % 100 mL IVPB  Status:  Discontinued        1 g 200 mL/hr over 30 Minutes Intravenous Every 12 hours 09/01/22 0416 09/03/22 0201   09/01/22 1100  fluconazole (DIFLUCAN) IVPB 200 mg        200 mg 100 mL/hr over 60 Minutes Intravenous Every 24 hours 09/01/22 1020     09/01/22 0600  meropenem (MERREM) 1 g in sodium chloride 0.9 % 100 mL IVPB  Status:  Discontinued        1 g 200 mL/hr over 30 Minutes Intravenous Every 8 hours 08/31/22 2136 09/01/22 0416   08/31/22 2200  sulfamethoxazole-trimethoprim (BACTRIM DS) 800-160 MG per tablet 1 tablet  Status:  Discontinued        1 tablet Oral 2 times daily 08/31/22 2011 08/31/22 2129   08/31/22 2200  amoxicillin (AMOXIL) capsule 500 mg  Status:  Discontinued        500 mg Oral Every 8 hours 08/31/22 2131 09/03/22 1417   08/31/22 2145  meropenem (MERREM) 1 g in sodium chloride 0.9 % 100 mL IVPB        1 g 200 mL/hr over 30 Minutes Intravenous  Once 08/31/22 2136 09/01/22 0820   08/31/22 1815  cefTRIAXone (ROCEPHIN) 1 g in sodium  chloride 0.9 % 100 mL IVPB        1 g 200 mL/hr over 30 Minutes Intravenous  Once 08/31/22 1814 08/31/22 1911       Subjective: Patient was seen and examined at bedside. Overnight events noted. Patient reports doing better,  denies any pain,  urine is getting clear. Still has indwelling Foley catheter, still remains on Levophed for being hypotensive. He was started on dobutamine infusion. Patient is upset because he was not given sleeping medication last night.  Objective: Vitals:   09/04/22 1000 09/04/22 1015 09/04/22 1030 09/04/22 1045  BP: (!) 91/53 (!) 94/59 99/64 (!) 104/59  Pulse: (!) 52 (!) 53 (!) 52 (!) 52  Resp: '14 15 16 10  '$ Temp:      TempSrc:      SpO2: 99% 99% 98% 100%  Weight:      Height:        Intake/Output Summary (Last 24 hours) at 09/04/2022 1118 Last data filed at 09/04/2022 1000 Gross per 24 hour  Intake 1988.08 ml  Output 3550 ml  Net -1561.92 ml   Filed Weights   09/01/22 0800 09/02/22 0315 09/04/22 0530  Weight: 90.9 kg 94.8 kg 102 kg    Examination:  General exam: Appears comfortable, not in any acute distress, deconditioned. Respiratory system:  CTA bilaterally, respiratory effort normal, RR 14. Cardiovascular system: S1-S2 heard, regular rate and rhythm, no murmur. Gastrointestinal system: Abdomen is soft, non tender, non distended, BS+ Central nervous system: Alert and oriented x 3. No focal neurological deficits. Extremities: Left BKA, no edema, no cyanosis, no clubbing. Skin: Scrotal erythema much improved, less swollen. Psychiatry: Judgement and insight appear normal. Mood & affect appropriate.     Data Reviewed: I have personally reviewed following labs and imaging studies  CBC: Recent Labs  Lab 08/31/22 1510 09/01/22 0040 09/03/22 0405 09/04/22 0604  WBC 14.4* 15.0* 7.2 5.9  NEUTROABS 11.9*  --   --   --   HGB 9.5* 8.4* 8.1* 7.6*  HCT 31.7* 29.1* 27.0* 24.6*  MCV 88.5 93.3 87.9 86.3  PLT 287 209 205 106   Basic  Metabolic Panel: Recent Labs  Lab 09/01/22 1237 09/01/22 1623 09/02/22 1935 09/03/22 0111 09/03/22 0405 09/03/22 1802 09/04/22 0604  NA 128*  --  133*  --  133* 131* 132*  K 4.8   < > 4.2 4.0 4.1 3.7 3.8  CL 104  --  106  --  108 105 106  CO2 16*  --  19*  --  18* 19* 20*  GLUCOSE 263*  --  241*  --  252* 213* 238*  BUN 45*  --  32*  --  32* 29* 27*  CREATININE 1.77*  --  1.04  --  0.96 0.97 0.90  CALCIUM 7.9*  --  7.9*  --  7.9* 7.6* 7.8*  MG  --   --   --   --  1.9 1.7 2.0  PHOS  --   --   --   --  2.3*  --  3.0   < > = values in this interval not displayed.   GFR: Estimated Creatinine Clearance: 100.5 mL/min (by C-G formula based on SCr of 0.9 mg/dL). Liver Function Tests: Recent Labs  Lab 08/31/22 1511 09/04/22 0604  AST 23 69*  ALT 15 124*  ALKPHOS 104 106  BILITOT 1.0 0.6  PROT 7.9 5.4*  ALBUMIN 3.7 2.4*   No results for input(s): "LIPASE", "AMYLASE" in the last 168 hours. No results for input(s): "AMMONIA" in the last 168 hours. Coagulation Profile: Recent Labs  Lab 08/31/22 1511  INR 1.7*   Cardiac Enzymes: No results for input(s): "CKTOTAL", "CKMB", "CKMBINDEX", "TROPONINI" in the last 168 hours. BNP (last 3 results) No results for input(s): "PROBNP" in the last 8760 hours. HbA1C: No results for input(s): "HGBA1C" in the last 72 hours. CBG: Recent Labs  Lab 09/03/22 0736 09/03/22 1156 09/03/22 1602 09/03/22 2120 09/04/22 0825  GLUCAP 257* 255* 250* 224* 227*   Lipid Profile: No results for input(s): "CHOL", "HDL", "LDLCALC", "TRIG", "CHOLHDL", "LDLDIRECT" in the last 72 hours. Thyroid Function Tests: No results for input(s): "TSH", "T4TOTAL", "FREET4", "T3FREE", "THYROIDAB" in the last 72 hours. Anemia Panel: No results for input(s): "VITAMINB12", "FOLATE", "FERRITIN", "TIBC", "IRON", "RETICCTPCT" in the last 72 hours. Sepsis Labs: Recent Labs  Lab 09/01/22 0040 09/01/22 0245 09/01/22 1237 09/02/22 1935  PROCALCITON 0.54  --   --   --    LATICACIDVEN 7.2* 5.5* 2.8* 2.0*    Recent Results (from the past 240 hour(s))  Urine Culture     Status: None   Collection Time: 08/31/22  4:22 PM   Specimen: Urine, Clean Catch  Result Value Ref Range Status   Specimen Description   Final    URINE, CLEAN CATCH Performed at  [facility], [place] [address] [place], [place] [address]    Special Requests   Final    NONE Performed at [facility], [place] [address] [place], [place] [address]    Culture   Final    NO GROWTH Performed at [facility], [place] [address] [place], [place] [address]    Report Status 09/02/2022 FINAL  Final  Blood culture (routine x 2)     Status: None (Preliminary result)   Collection Time: 08/31/22  6:15 PM   Specimen: BLOOD  Result Value Ref Range Status   Specimen Description   Final    BLOOD SITE NOT SPECIFIED Performed at [place] [address], [place], [place] [address]    Special Requests   Final    BOTTLES DRAWN AEROBIC AND ANAEROBIC Blood Culture adequate volume Performed at [place] [address] [place], [place] [address]    Culture   Final    NO GROWTH 2 DAYS Performed at [facility], [address] [place], [place] [address]    Report Status PENDING  Incomplete  Blood culture (routine x 2)     Status: None (Preliminary result)   Collection Time: 08/31/22  6:30 PM   Specimen: BLOOD  Result Value Ref Range Status   Specimen Description   Final    BLOOD SITE NOT SPECIFIED Performed at [facility] [address], [place], [place] [address]    Special Requests   Final    BOTTLES DRAWN AEROBIC AND ANAEROBIC Blood Culture results may not be optimal due to an inadequate volume of blood received in culture bottles Performed at [place] [address], [place], [place] [address]    Culture   Final    NO GROWTH 2 DAYS Performed at [facility] [address] [place], [place] [address]    Report Status PENDING  Incomplete  MRSA Next Gen by PCR, Nasal     Status: None   Collection Time: 09/01/22 12:00 AM   Specimen: Nasal Mucosa; Nasal Swab  Result Value Ref Range Status   MRSA by PCR Next Gen NOT DETECTED NOT DETECTED Final    Comment: (NOTE) The GeneXpert MRSA Assay (FDA approved for NASAL specimens only), is one component of a comprehensive MRSA colonization surveillance program. It is not intended to diagnose MRSA infection nor to guide or monitor treatment for MRSA infections. Test performance is not FDA approved in patients less than 29 years old. Performed at [facility], [place] [address] [place], [place] [address]          Radiology Studies: ECHOCARDIOGRAM COMPLETE  Result Date: 09/03/2022    ECHOCARDIOGRAM REPORT   Patient Name:   AERIC BURNHAM Date of Exam: 09/03/2022 Medical Rec #:  [id number]      Height:       76.0 in Accession #:    [id number]     Weight:       200.4 lb Date of Birth:  Jan 04, 1956       BSA:          2.217 m Patient Age:    7 years       BP:           93/68 mmHg Patient Gender: M              HR:           91  bpm. Exam Location:  Inpatient Procedure: 2D Echo and Intracardiac Opacification Agent Indications:    CHF  History:        Patient has prior history of Echocardiogram examinations, most                 recent 06/15/2022. Risk Factors:Diabetes and Dyslipidemia.  Sonographer:    Harvie Junior Referring Phys: [id number] Donita Brooks  Sonographer Comments: Suboptimal parasternal window. IMPRESSIONS  1. Left ventricular ejection fraction, by estimation, is 30 to 35%. The left ventricle has moderately decreased function. The left ventricle demonstrates regional wall motion abnormalities (see scoring diagram/findings for description). The left ventricular internal cavity size was mildly dilated. Left ventricular diastolic parameters are consistent with Grade II diastolic  dysfunction (pseudonormalization).  2. Right ventricular systolic function is moderately reduced. The right ventricular size is mildly enlarged. There is mildly elevated pulmonary artery systolic pressure. The estimated right ventricular systolic pressure is 40.3 mmHg.  3. Left atrial size was moderately dilated.  4. Right atrial size was mildly dilated.  5. The mitral valve is abnormal. Moderate mitral valve regurgitation. No evidence of mitral stenosis.  6. The aortic valve is tricuspid. There is mild calcification of the aortic valve. Aortic valve regurgitation is not visualized. Aortic valve sclerosis is present, with no evidence of aortic valve stenosis.  7. Aortic dilatation noted. There is mild dilatation of the aortic root, measuring 42 mm.  8. The inferior vena cava is normal in size with <50% respiratory variability, suggesting right atrial pressure of 8 mmHg. Comparison(s): Changes from prior study are noted. EF significantly reduced compared to prior, now with more prominent anterior wall motion abnormalities. Findings communicated to PCCM team. Conclusion(s)/Recommendation(s): No left ventricular mural or apical thrombus/thrombi. FINDINGS  Left Ventricle: Left ventricular ejection fraction, by estimation, is 30 to 35%. The left ventricle has moderately decreased function. The left ventricle demonstrates regional wall motion abnormalities. Definity contrast agent was given IV to delineate the left ventricular endocardial borders. The left ventricular internal cavity size was mildly dilated. There is no left ventricular hypertrophy. Left ventricular diastolic parameters are consistent with Grade II diastolic dysfunction (pseudonormalization).  LV Wall Scoring: The mid and distal anterior wall and apical lateral segment are akinetic. The antero-lateral wall, posterior wall, basal anteroseptal segment, basal anterior segment, basal inferior segment, basal inferoseptal segment, and apex are hypokinetic. The  mid and distal anterior septum, mid and distal inferior wall, and mid inferoseptal segment are normal. Right Ventricle: The right ventricular size is mildly enlarged. Right vetricular wall thickness was not well visualized. Right ventricular systolic function is moderately reduced. There is mildly elevated pulmonary artery systolic pressure. The tricuspid  regurgitant velocity is 3.03 m/s, and with an assumed right atrial pressure of 8 mmHg, the estimated right ventricular systolic pressure is 47.4 mmHg. Left Atrium: Left atrial size was moderately dilated. Right Atrium: Right atrial size was mildly dilated. Pericardium: There is no evidence of pericardial effusion. Mitral Valve: The mitral valve is abnormal. Moderate mitral valve regurgitation. No evidence of mitral valve stenosis. Tricuspid Valve: The tricuspid valve is normal in structure. Tricuspid valve regurgitation is mild . No evidence of tricuspid stenosis. Aortic Valve: The aortic valve is tricuspid. There is mild calcification of the aortic valve. Aortic valve regurgitation is not visualized. Aortic valve sclerosis is present, with no evidence of aortic valve stenosis. Aortic valve mean gradient measures 2.0 mmHg. Aortic valve peak gradient measures 3.5 mmHg. Aortic valve area, by VTI measures 3.12 cm. Pulmonic Valve:  The pulmonic valve was not well visualized. Pulmonic valve regurgitation is not visualized. No evidence of pulmonic stenosis. Aorta: Aortic dilatation noted. There is mild dilatation of the aortic root, measuring 42 mm. Venous: The inferior vena cava is normal in size with less than 50% respiratory variability, suggesting right atrial pressure of 8 mmHg. IAS/Shunts: The atrial septum is grossly normal. Additional Comments: There is a small pleural effusion in both left and right lateral regions.  LEFT VENTRICLE PLAX 2D LVIDd:         5.00 cm      Diastology LVIDs:         4.50 cm      LV e' medial:    5.11 cm/s LV PW:         0.90 cm       LV E/e' medial:  15.5 LV IVS:        0.90 cm      LV e' lateral:   14.10 cm/s LVOT diam:     2.40 cm      LV E/e' lateral: 5.6 LV SV:         47 LV SV Index:   21 LVOT Area:     4.52 cm  LV Volumes (MOD) LV vol d, MOD A2C: 189.0 ml LV vol d, MOD A4C: 177.0 ml LV vol s, MOD A2C: 151.0 ml LV vol s, MOD A4C: 116.0 ml LV SV MOD A2C:     38.0 ml LV SV MOD A4C:     177.0 ml LV SV MOD BP:      54.7 ml RIGHT VENTRICLE RV Basal diam:  5.00 cm RV Mid diam:    4.00 cm TAPSE (M-mode): 1.0 cm LEFT ATRIUM              Index        RIGHT ATRIUM           Index LA diam:        4.20 cm  1.89 cm/m   RA Area:     20.30 cm LA Vol (A2C):   81.9 ml  36.94 ml/m  RA Volume:   65.30 ml  29.45 ml/m LA Vol (A4C):   107.0 ml 48.26 ml/m LA Biplane Vol: 100.0 ml 45.10 ml/m  AORTIC VALVE                    PULMONIC VALVE AV Area (Vmax):    2.66 cm     PV Vmax:       0.39 m/s AV Area (Vmean):   2.78 cm     PV Peak grad:  0.6 mmHg AV Area (VTI):     3.12 cm AV Vmax:           93.63 cm/s AV Vmean:          64.867 cm/s AV VTI:            0.151 m AV Peak Grad:      3.5 mmHg AV Mean Grad:      2.0 mmHg LVOT Vmax:         55.03 cm/s LVOT Vmean:        39.867 cm/s LVOT VTI:          0.104 m LVOT/AV VTI ratio: 0.69  AORTA Ao Root diam: 4.40 cm Ao Asc diam:  4.20 cm MITRAL VALVE                  TRICUSPID VALVE MV Area (  PHT): 3.59 cm       TR Peak grad:   36.7 mmHg MV Decel Time: 211 msec       TR Mean grad:   22.0 mmHg MR Peak grad:    56.6 mmHg    TR Vmax:        303.00 cm/s MR Mean grad:    35.0 mmHg    TR Vmean:       218.0 cm/s MR Vmax:         376.00 cm/s MR Vmean:        269.0 cm/s   SHUNTS MR PISA:         3.08 cm     Systemic VTI:  0.10 m MR PISA Eff ROA: 20 mm       Systemic Diam: 2.40 cm MR PISA Radius:  0.70 cm MV E velocity: 79.38 cm/s MV A velocity: 35.60 cm/s MV E/A ratio:  2.23 Buford Dresser MD Electronically signed by Buford Dresser MD Signature Date/Time: 09/03/2022/3:19:09 PM    Final    [place] EKG SITE  RITE  Result Date: 09/03/2022 If Site Rite image not attached, placement could not be confirmed due to current cardiac rhythm.  CT Angio Chest Pulmonary Embolism (PE) W or WO Contrast  Result Date: 09/03/2022 CLINICAL DATA:  Pulmonary embolism suspected, with positive D-dimer, hypotension. History of diabetes, ischemic cardiomyopathy, COVID positive 08/02/2022 EXAM: CT ANGIOGRAPHY CHEST WITH CONTRAST TECHNIQUE: Multidetector CT imaging of the chest was performed using the standard protocol during bolus administration of intravenous contrast. Multiplanar CT image reconstructions and MIPs were obtained to evaluate the vascular anatomy. RADIATION DOSE REDUCTION: This exam was performed according to the departmental dose-optimization program which includes automated exposure control, adjustment of the mA and/or kV according to patient size and/or use of iterative reconstruction technique. CONTRAST:  195m OMNIPAQUE IOHEXOL 350 MG/ML SOLN COMPARISON:  Portable chest today, portable chest and chest CT no contrast 08/02/2022, and chest CT without contrast 07/03/2022. FINDINGS: Cardiovascular: Overall borderline cardiomegaly again noted with mild biatrial chamber prominence. There are three-vessel coronary artery calcifications heaviest in the right and LAD arteries, moderate patchy calcific plaques in the aorta and scattered calcific plaques in the great vessels. The pulmonary trunk is 3.5 cm indicating arterial hypertension. No arterial embolus is seen through the segmental divisions. The subsegmental arterial bed is not evaluated due to breathing motion obscuring visualization of vessels. The superior pulmonary veins are slightly distended but no more than previously. There is no pericardial effusion. Mediastinum/Nodes: Slightly prominent right paratracheal and subcarinal lymph nodes up to 1.1 cm in short axis, noted previously and unchanged. There are calcified left hilar nodes. There are borderline prominent  right hilar nodes, largest 9 mm in short axis. The thyroid gland, thoracic trachea, main airways, thoracic esophagus and axillary spaces are unremarkable. No further adenopathy. Lungs/Pleura: Small to moderate-sized bilateral symmetric layering pleural effusions are slightly improved compared with the prior CTs and are likely chronic. There is no pneumothorax. There is slight interstitial edema in the base of the lungs but this has improved. There is diffuse bronchial thickening which has increased. There is compressive atelectasis, less likely consolidation in the posterior lungs alongside the effusions. A calcified lingular granuloma is again noted. The aerated lungs anterior to the pleural effusions are generally clear. Upper Abdomen: Small amount of perihepatic ascites, new. Cholelithiasis is partially visible. Pancreatic atrophy. No other significant upper abdominal finding. Musculoskeletal: No chest wall abnormality. No acute or significant osseous findings. There are mild degenerative  changes in the lower thoracic spine. Review of the MIP images confirms the above findings. IMPRESSION: 1. No evidence of arterial embolus through the segmental divisions. Subsegmental arterial bed obscured by breathing motion. 2. Borderline cardiomegaly with mild biatrial chamber prominence, slight distention of the superior pulmonary veins, and slight interstitial edema in the base of the lungs. 3. Small to moderate-sized layering pleural effusions with compressive atelectasis, less likely consolidation in the posterior lungs alongside the effusions. 4. Diffuse bronchial thickening which could be due to bronchitis, reactive airways disease or fluid overload, interval increased since 08/02/2022. 5. Aortic and coronary artery atherosclerosis. 6. Slightly prominent mediastinal and right hilar nodes, unchanged. 7. Small amount of perihepatic ascites, new. 8. Cholelithiasis. Aortic Atherosclerosis (ICD10-I70.0). Electronically  Signed   By: Telford Nab M.D.   On: 09/03/2022 00:21   DG CHEST PORT 1 VIEW  Result Date: 09/02/2022 CLINICAL DATA:  Heart failure with reduced ejection fraction. No current shortness of breath or chest pain. [id number]. EXAM: PORTABLE CHEST 1 VIEW COMPARISON:  Chest CT without contrast and portable chest both dated 08/02/2022. FINDINGS: 5:56 p.m. Heart silhouette is normal in size. Coronary artery and aortic calcifications are again visible. There is central vascular prominence, mild interstitial edema in the lung bases bilateral small pleural effusions. There is haziness in the lower lung fields which could be atelectasis, pneumonitis or ground-glass edema. The mid and upper lung fields are clear. Similar findings were noted on the prior studies, although the interstitial edema was considerably greater previously. IMPRESSION: 1. Mild interstitial edema in the lung bases with small pleural effusions. 2. Haziness in the lower lung fields which could be atelectasis, pneumonitis or ground-glass edema. 3. Aortic atherosclerosis and coronary artery calcifications. 4. Compare: As above. Electronically Signed   By: Telford Nab M.D.   On: 09/02/2022 20:23   VAS [place] LOWER EXTREMITY VENOUS (DVT)  Result Date: 09/02/2022  Lower Venous DVT Study Patient Name:  MONISH HALIBURTON  Date of Exam:   09/02/2022 Medical Rec #: [id number]       Accession #:    [id number] Date of Birth: 1956/06/10        Patient Gender: M Patient Age:   83 years Exam Location:  [facility] Procedure:      VAS [place] LOWER EXTREMITY VENOUS (DVT) Referring Phys: Noe Gens --------------------------------------------------------------------------------  Indications: Swelling.  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 Limitations: Poor ultrasound/tissue interface and patient positioning. Comparison Study: No prior studies. Performing Technologist: Oliver Hum RVT  Examination Guidelines: A complete evaluation includes B-mode imaging, spectral Doppler,  color Doppler, and power Doppler as needed of all accessible portions of each vessel. Bilateral testing is considered an integral part of a complete examination. Limited examinations for reoccurring indications may be performed as noted. The reflux portion of the exam is performed with the patient in reverse Trendelenburg.  +---------+---------------+---------+-----------+----------+--------------+ RIGHT    CompressibilityPhasicitySpontaneityPropertiesThrombus Aging +---------+---------------+---------+-----------+----------+--------------+ CFV      Full           Yes      Yes                                 +---------+---------------+---------+-----------+----------+--------------+ SFJ      Full                                                        +---------+---------------+---------+-----------+----------+--------------+  FV Prox  Full                                                        +---------+---------------+---------+-----------+----------+--------------+ FV Mid   Full                                                        +---------+---------------+---------+-----------+----------+--------------+ FV DistalFull                                                        +---------+---------------+---------+-----------+----------+--------------+ PFV      Full                                                        +---------+---------------+---------+-----------+----------+--------------+ POP      Full           Yes      Yes                                 +---------+---------------+---------+-----------+----------+--------------+ PTV      Full                                                        +---------+---------------+---------+-----------+----------+--------------+ PERO     Full                                                        +---------+---------------+---------+-----------+----------+--------------+    +---------+---------------+---------+-----------+----------+--------------+ LEFT     CompressibilityPhasicitySpontaneityPropertiesThrombus Aging +---------+---------------+---------+-----------+----------+--------------+ CFV      Full           Yes      Yes                                 +---------+---------------+---------+-----------+----------+--------------+ SFJ      Full                                                        +---------+---------------+---------+-----------+----------+--------------+ FV Prox  Full                                                        +---------+---------------+---------+-----------+----------+--------------+  FV Mid   Full                                                        +---------+---------------+---------+-----------+----------+--------------+ FV DistalFull                                                        +---------+---------------+---------+-----------+----------+--------------+ PFV      Full                                                        +---------+---------------+---------+-----------+----------+--------------+ POP      Full           Yes      Yes                                 +---------+---------------+---------+-----------+----------+--------------+ PTV                                                   BKA            +---------+---------------+---------+-----------+----------+--------------+ PERO                                                  BKA            +---------+---------------+---------+-----------+----------+--------------+     Summary: RIGHT: - There is no evidence of deep vein thrombosis in the lower extremity. However, portions of this examination were limited- see technologist comments above.  - No cystic structure found in the popliteal fossa.  LEFT: - There is no evidence of deep vein thrombosis in the lower extremity. However, portions of this examination were  limited- see technologist comments above.  - No cystic structure found in the popliteal fossa.  *See table(s) above for measurements and observations. Electronically signed by Servando Snare MD on 09/02/2022 at 6:04:34 PM.    Final     Scheduled Meds:  atorvastatin  80 mg Oral Daily   Chlorhexidine Gluconate Cloth  6 each Topical Daily   finasteride  5 mg Oral Daily   furosemide  60 mg Intravenous BID   gabapentin  600 mg Oral QHS   heparin  2,000 Units Intravenous Once   insulin aspart  0-9 Units Subcutaneous TID AC & HS   insulin aspart  3 Units Subcutaneous TID WC   insulin detemir  12 Units Subcutaneous BID   melatonin  5 mg Oral QHS   midodrine  10 mg Oral TID with meals   pantoprazole  40 mg Oral Daily   phosphorus  250 mg Oral TID   sodium bicarbonate  650 mg Oral TID   sodium chloride flush  10-40 mL Intracatheter Q12H   tamsulosin  0.4 mg Oral Daily   Continuous Infusions:  sodium chloride Stopped (09/04/22 0558)   sodium chloride Stopped (09/04/22 0855)   DOBUTamine 2.5 mcg/kg/min (09/04/22 1000)   fluconazole (DIFLUCAN) IV Stopped (09/03/22 1151)   heparin     meropenem (MERREM) IV Stopped (09/04/22 0930)   norepinephrine (LEVOPHED) Adult infusion 2 mcg/min (09/04/22 1000)     LOS: 3 days    Time spent: 50 mins    Aayden Cefalu, MD [facility]   If 7PM-7AM, please contact night-coverage

## 2022-09-04 NOTE — Consult Note (Addendum)
Cardiology Consultation   Patient ID: Peter Yang MRN: [id number]; DOB: 01-17-1956  Admit date: 08/31/2022 Date of Consult: 09/04/2022  PCP:  Peter Cuff, MD   [facility] Cardiologist:  None        Patient Profile:   Peter Yang is a 66 y.o. male with a hx of CAD, ascending TAA (not seen on CT 07/2022, 53m aortic root by echo 11/14), TIA, PAF found during 05/2022 admission. DM2 with polyneuropathy and diabetic ulcer, L BKA, HTN, HLD, chronic HFrEF, BPH, UTIs, bowel perforation, prsostate CA, OSA, prior retroperitoneal abscess who is being seen 09/04/2022 for the evaluation of CHF at the request of Peter Gens NP.  History of Present Illness:   Mr. LHankersonhas an extremely complex medical history. From cardiac standpoint has h/o CAD s/p PTCA and PCI of the RCA in 01/2022 at outside facility. Our team met him in 05/2022 for NSTEMI (peak troponin 6k) in the setting of being found down not breathing in setting of hypoglycemia and multiple medical issues. He was also found to have afib at that time. He underwent cath 06/17/2022 showing 50% stenosis of mid LAD, 95% stenosis of dominant branch of large D1, CTO of later branch of D1, CTO of proximal LCX, CTO of proximal to mid RCA including of prior stent in 01/2022 with left to right collaterals. He was deemed not a candidate for CABG and disease was not amenable to PCI. EF was 40-45% at that time. ASA/Brilinta changed to Plavix and Eliquis. He as readmitted early 06/2022 to our facility with AMS and multiple medical issues, again seen for elevated troponin to 7.765 without chest pain, suspected to be demand ischemia in the setting of unrevascularizable CAD. Plavix was held due to gross hematuria and positive hemoccult. He was then admitted later in 06/2022 with hematuria and complicated UTI with multidrug resistant organisms requiring abx. He was readmitted to our system with AMS 07/2022 felt due to either hypoglycemia or  infectious process, Covid + as well and required discharge to facility. [facility] hematuria and AKI felt prerenal /ATN  vs postrenal/obstructive uropathy in the setting of gross hematuria with clots requiring Foley placement and irrigation, plus septic shock felt secondary to UTI requiring midodrine, levophed, and dobutamine, lactic acidosis. Hospital course otherwise notable for pressure ulcer of the sacrum, cellulitis of scrotum, hyperkalemia requiring Lokelma, metabolic acidosis, hyponatremia. Troponins 263->771. 2D echo shows progressive LV dysfunction with EF 30-35%, G2DD, moderate RV dysfunction, mildly elevated PASP, moderate LAE, mild RA, moderate MR, mild dilation of aortic root, aortic sclerosis without stenosis. He was previously receiving IV fluids daily but yesterday was started on IV Lasix at 61m to continue BID dosing today. Last labs show mild transaminitis, albumin of 2.4, Hgb 7.6, BNP 3094. CT angio 09/02/22 showed no PE but obscured by breathing motion, borderline cardiomegaly with mild biatrial chamber prominence, slight distention of the superior pulmonary veins, and slight interstitial edema in the base of the lungs, Small to moderate-sized layering pleural effusions with compressive atelectasis, less likely consolidation in the posterior lungs alongside the effusions, Diffuse bronchial thickening which could be due to bronchitis, reactive airways disease or fluid overload, interval increased since 08/02/2022, aortic/coronary atherosclerosis, slightly prominent mediastinal and right hilar nodes, unchanged, small amount of perihepatic ascites (new), cholelithiasis. Palliative care has seen the patient and he is DNR/DNI, to continue full scope of care otherwise. Dr. ClCarlis Yang concerned that his clinical picture is less that of septic shock,  and more that of a cardiogenic picture since his cultures have been unrevealing. He does not have a single person in his  personal support system, states he is all alone.  He is seen at bedside and denies any acute complaints. He does not really participate in the conversation unless spoken to, and answers in very brief answers. He is A+Ox. Denies CP or SOB. Urine remains ruddy brownish with what looks like sediment in foley.   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   Coronary artery disease involving native coronary artery of native heart without angina pectoris 06/25/2022   Diabetic polyneuropathy associated with type 2 diabetes mellitus ([place]) 07/28/2021   Essential hypertension 01/25/2016   Hematuria and +fecal occult  06/25/2022   History of diabetic ulcer of foot 12/31/2019   History of non-ST elevation myocardial infarction (NSTEMI) 06/15/2022   Hx of right coronary artery stent placement 02/11/2022   Hyperlipidemia    Ischemic cardiomyopathy    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 Overview:  Added automatically from request for surgery [id number] Formatting of this note might be different from the original. Added automatically from request for surgery [id number]   Large bowel perforation ([place]) 09/27/2015   Malignant neoplasm of prostate ([place]) 10/22/2019   Myopia with astigmatism and presbyopia, bilateral 06/15/2018   Last Assessment & Plan:  Formatting of this note might be different from the original. - Wrx printed Formatting of this note might be different from the original. 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 - Stable, monitor Formatting of this note might be different from the original. Last Assessment & Plan:  - Stable, monitor   Retroperitoneal abscess ([place]) 10/02/2015   Status post corneal transplant 10/07/2016   Formatting of this note might be different from the original. Overview:  Added automatically from request for surgery [id number] Formatting of this note might be different from the original. Added automatically from request for surgery [id number]  Last Assessment & Plan:  Formatting of this note might be different from the original. - OD, 2/2 KCN - Stable, no NVK / rejection - Continue off steroids - Mo   Thoracic ascending aortic aneurysm ([place])    TIA (transient ischemic attack) 2012   Type 2 diabetes mellitus ([place])     Past Surgical History:  Procedure Laterality Date   BELOW KNEE LEG AMPUTATION Left 2022   CORONARY ANGIOPLASTY WITH STENT PLACEMENT  01/2022   DES RCA   LEFT HEART CATH AND CORONARY ANGIOGRAPHY N/A 06/17/2022   Procedure: LEFT HEART CATH AND CORONARY ANGIOGRAPHY;  Surgeon: Nelva Bush, MD;  Location: [place] [facility];  Service: Cardiovascular;  Laterality: N/A;   ROTATOR Yang REPAIR Left      Home Medications:  Prior to Admission medications   Medication Sig Start Date End Date Taking? Authorizing Provider  acetaminophen (TYLENOL) 325 MG tablet Take 2 tablets (650 mg total) by mouth every 6 (six) hours as needed for  mild pain (or Fever >/= 101). 07/30/21  Yes Sheikh, Omair Latif, DO  Amino Acids-Protein Hydrolys (PRO-STAT AWC) LIQD Take 30 mLs by mouth 2 (two) times daily.   Yes [provider]  apixaban (ELIQUIS) 5 MG TABS tablet Take 1 tablet (5 mg total) by mouth 2 (two) times daily. 08/16/22 09/15/22 Yes Gerrit Heck, MD  aspirin 81 MG chewable tablet Chew 1 tablet (81 mg total) by mouth daily. 08/16/22  Yes Gerrit Heck, MD  atorvastatin (LIPITOR) 80 MG tablet Take 80 mg by mouth daily. 03/17/21  Yes  [provider]  clopidogrel (PLAVIX) 75 MG tablet Take 1 tablet (75 mg total) by mouth daily. 08/16/22  Yes Gerrit Heck, MD  diclofenac Sodium (VOLTAREN) 1 % GEL Apply 2 g topically 2 (two) times daily as needed (pain). 07/20/21  Yes [provider]  empagliflozin (JARDIANCE) 10 MG TABS tablet Take 10 mg by mouth daily at 12 noon.   Yes [provider]  finasteride (PROSCAR) 5 MG tablet Take 1 tablet (5 mg total) by mouth daily. 06/30/22 09/28/22 Yes Pokhrel, Laxman, MD  gabapentin (NEURONTIN) 300 MG capsule Take 300 mg by mouth at bedtime. 07/20/21  Yes [provider]  Magnesium 500 MG TABS Take 500 mg by mouth at bedtime.   Yes [provider]  metFORMIN (GLUCOPHAGE) 500 MG tablet Take 1,000 mg by mouth 2 (two) times daily with a meal.   Yes [provider]  midodrine (PROAMATINE) 10 MG tablet Take 10 mg by mouth 3 (three) times daily. 07/29/22  Yes [provider]  pantoprazole (PROTONIX) 40 MG tablet Take 1 tablet (40 mg total) by mouth daily. 06/30/22  Yes Pokhrel, Laxman, MD  potassium chloride SA (KLOR-CON M) 20 MEQ tablet Take 20 mEq by mouth daily.   Yes [provider]  Propylene Glycol (SYSTANE BALANCE) 0.6 % SOLN Place 1 drop into both eyes daily as needed.   Yes [provider]  saccharomyces boulardii (FLORASTOR) 250 MG capsule Take 250 mg by mouth 2 (two) times daily.   Yes [provider]  tamsulosin (FLOMAX) 0.4 MG CAPS capsule Take 0.4 mg by mouth daily. 07/29/22  Yes [provider]    Inpatient Medications: Scheduled Meds:  atorvastatin  80 mg Oral Daily   Chlorhexidine Gluconate Cloth  6 each Topical Daily   finasteride  5 mg Oral Daily   furosemide  60 mg Intravenous BID   gabapentin  300 mg Oral QHS   insulin aspart  0-9 Units Subcutaneous TID AC & HS   insulin aspart  3 Units Subcutaneous TID WC   insulin detemir  12 Units Subcutaneous BID   midodrine  10 mg Oral TID  with meals   pantoprazole  40 mg Oral Daily   phosphorus  250 mg Oral TID   sodium bicarbonate  650 mg Oral TID   sodium chloride flush  10-40 mL Intracatheter Q12H   tamsulosin  0.4 mg Oral Daily   Continuous Infusions:  sodium chloride Stopped (09/04/22 0558)   sodium chloride Stopped (09/04/22 0558)   DOBUTamine 2.5 mcg/kg/min (09/04/22 0726)   fluconazole (DIFLUCAN) IV Stopped (09/03/22 1151)   meropenem (MERREM) IV 1 g (09/04/22 0856)   norepinephrine (LEVOPHED) Adult infusion 4 mcg/min (09/04/22 0726)   PRN Meds: sodium chloride, acetaminophen **OR** acetaminophen, mouth rinse, sodium chloride flush, zolpidem  Allergies:   No Known Allergies  Social History:   Social History   Socioeconomic History   Marital status: Single  Spouse name: Not on file   Number of children: Not on file   Years of education: Not on file   Highest education level: Not on file  Occupational History   Not on file  Tobacco Use   Smoking status: Never   Smokeless tobacco: Never  Substance and Sexual Activity   Alcohol use: Never   Drug use: Never   Sexual activity: Not on file  Other Topics Concern   Not on file  Social History Narrative   Not on file   Social Determinants of Health   Financial Resource Strain: Not on file  Food Insecurity: Not on file  Transportation Needs: Not on file  Physical Activity: Not on file  Stress: Not on file  Social Connections: Not on file  Intimate Partner Violence: Not on file    Family History:    Family History  Problem Relation Age of Onset   Diabetes Mother    Hypertension Mother    Heart disease Mother    Cancer Father    Heart attack Sister    Cancer Sister    Diabetes Brother      ROS:  Please see the history of present illness.   All other ROS reviewed and negative.     Physical Exam/Data:   Vitals:   09/04/22 0600 09/04/22 0615 09/04/22 0630 09/04/22 0800  BP: 106/71 100/61 (!) 98/58   Pulse: 98 94 95   Resp: _0 Temp:    98.1 F (36.7 C)  TempSrc:    Oral  SpO2: 99% 98% 99%   Weight:      Height:        Intake/Output Summary (Last 24 hours) at 09/04/2022 0936 Last data filed at 09/04/2022 0900 Gross per 24 hour  Intake 1849.8 ml  Output 3550 ml  Net -1700.2 ml      09/04/2022    5:30 AM 09/02/2022    3:15 AM 09/01/2022    8:00 AM  Last 3 Weights  Weight (lbs) 224 lb 13.9 oz 208 lb 15.9 oz 200 lb 6.4 oz  Weight (kg) 102 kg 94.8 kg 90.9 kg     Body mass index is 27.37 kg/m.  General: Well developed, well nourished, in no acute distress. Head: Normocephalic, atraumatic, sclera non-icteric, no xanthomas, nares are without discharge. Facial muscular atrophy. Neck: Negative for carotid bruits. JVP not elevated. Lungs: Coarse diffusely, no overt wheezing or rhonchi.  Heart: Irregular due to frequent ectopy. 2/6 SEM. No rubs, or gallops.  Abdomen: Soft, non-tender, non-distended with normoactive bowel sounds. No rebound/guarding. Extremities: No clubbing or cyanosis. No edema in RLE, warm. S/p L BKA. Pale. Neuro: Alert and oriented X 3. Moves all extremities spontaneously. Psych: Flat affect.   EKG:  The EKG was personally reviewed and demonstrates:  NSR with first degree AVB, occasional PAC, cannot exclude prior inferior infarct, nonspecific STTW changes with subtle ST sagging in multiple leads Telemetry:  Telemetry was personally reviewed and demonstrates:  NSR, occasional ? MAT with occasional PACs  Relevant CV Studies: Echo 09/03/22    1. Left ventricular ejection fraction, by estimation, is 30 to 35%. The  left ventricle has moderately decreased function. The left ventricle  demonstrates regional wall motion abnormalities (see scoring  diagram/findings for description). The left  ventricular internal cavity size was mildly dilated. Left ventricular  diastolic parameters are consistent with Grade II diastolic dysfunction  (pseudonormalization).   2. Right ventricular  systolic function is moderately reduced. The  right  ventricular size is mildly enlarged. There is mildly elevated pulmonary  artery systolic pressure. The estimated right ventricular systolic  pressure is 86.7 mmHg.   3. Left atrial size was moderately dilated.   4. Right atrial size was mildly dilated.   5. The mitral valve is abnormal. Moderate mitral valve regurgitation. No  evidence of mitral stenosis.   6. The aortic valve is tricuspid. There is mild calcification of the  aortic valve. Aortic valve regurgitation is not visualized. Aortic valve  sclerosis is present, with no evidence of aortic valve stenosis.   7. Aortic dilatation noted. There is mild dilatation of the aortic root,  measuring 42 mm.   8. The inferior vena cava is normal in size with <50% respiratory  variability, suggesting right atrial pressure of 8 mmHg.   Comparison(s): Changes from prior study are noted. EF significantly  reduced compared to prior, now with more prominent anterior wall motion  abnormalities. Findings communicated to PCCM team.   Conclusion(s)/Recommendation(s): No left ventricular mural or apical  thrombus/thrombi.   ABIs 07/2022  Summary:  Right: Resting right ankle-brachial index indicates noncompressible right  lower extremity arteries. The right toe-brachial index is abnormal.  Today's examination was limited due to patient movement/altered mental  status.   Left:   S/p left BKA.   Cath 05/2022 Conclusion  Conclusions: Severe multivessel coronary artery disease with severe calcification, as detailed below, including 50% mid LAD stenosis, 95% stenosis of dominant branch of large D1 and chronic total occlusion of lateral branch of D1, chronic total occlusion of LCx, and chronic total occlusion of ostial through mid RCA (including stent placed in 01/2022) with left-to-right collaterals. Normal left ventricular filling pressure (LVEDP 10 mmHg).   Recommendations: Consider viability  study with particular attention to the inferior wall.  If RCA territory is viable, cardiac surgery consultation for CABG should be considered.  If RCA territory is not viable (or the patient is not a candidate for CABG), medical therapy versus PCI to dominant branch of D1 (particularly if the patient reports recurrent angina) should be considered. Hold ticagrelor pending further workup and possible cardiac surgery consultation.  If the patient does not undergo CABG, I would favor 12 months of clopidogrel + DOAC therapy in the setting of new atrial fibrillation and NSTEMI. Aggressive secondary prevention of coronary artery disease.   Nelva Bush, MD [facility]  Echo 05/2022     1. Left ventricular ejection fraction, by estimation, is 40 to 45%. The  left ventricle has mildly decreased function. The left ventricle  demonstrates regional wall motion abnormalities (see scoring  diagram/findings for description). There is mild left  ventricular hypertrophy. Left ventricular diastolic function could not be  evaluated. There is moderate hypokinesis of the left ventricular,  basal-mid inferior wall and inferolateral wall.   2. Right ventricular systolic function is mildly reduced. The right  ventricular size is normal.   3. Left atrial size was severely dilated.   4. The mitral valve is abnormal. Moderate mitral valve regurgitation.   5. The aortic valve is tricuspid. Aortic valve regurgitation is not  visualized.   6. Aortic dilatation noted. There is moderate dilatation of the ascending  aorta, measuring 47 mm. There is mild dilatation of the aortic root,  measuring 42 mm.   7. The inferior vena cava is normal in size with greater than 50%  respiratory variability, suggesting right atrial pressure of 3 mmHg.   Comparison(s): Changes from prior study are noted. 02/11/2022 [facility]):  LVEF 35-40%, global hypokinesis worse laterally.      Laboratory Data:  High Sensitivity Troponin:    Recent Labs  Lab 09/01/22 0040 09/02/22 1935  TROPONINIHS 263* 771*     Chemistry Recent Labs  Lab 09/03/22 0405 09/03/22 1802 09/04/22 0604  NA 133* 131* 132*  K 4.1 3.7 3.8  CL 108 105 106  CO2 18* 19* 20*  GLUCOSE 252* 213* 238*  BUN 32* 29* 27*  CREATININE 0.96 0.97 0.90  CALCIUM 7.9* 7.6* 7.8*  MG 1.9 1.7 2.0  GFRNONAA >60 >60 >60  ANIONGAP _0 Recent Labs  Lab 08/31/22 1511 09/04/22 0604  PROT 7.9 5.4*  ALBUMIN 3.7 2.4*  AST 23 69*  ALT 15 124*  ALKPHOS 104 106  BILITOT 1.0 0.6   Lipids No results for input(s): "CHOL", "TRIG", "HDL", "LABVLDL", "LDLCALC", "CHOLHDL" in the last 168 hours.  Hematology Recent Labs  Lab 09/01/22 0040 09/03/22 0405 09/04/22 0604  WBC 15.0* 7.2 5.9  RBC 3.12* 3.07* 2.85*  HGB 8.4* 8.1* 7.6*  HCT 29.1* 27.0* 24.6*  MCV 93.3 87.9 86.3  MCH 26.9 26.4 26.7  MCHC 28.9* 30.0 30.9  RDW 16.1* 16.0* 16.1*  PLT 209 205 169   Thyroid No results for input(s): "TSH", "FREET4" in the last 168 hours.  BNP Recent Labs  Lab 09/03/22 0415  BNP 3,094.3*    DDimer  Recent Labs  Lab 09/02/22 1935  DDIMER 2.00*     Radiology/Studies:  ECHOCARDIOGRAM COMPLETE  Result Date: 09/03/2022    ECHOCARDIOGRAM REPORT   Patient Name:   Peter Yang Date of Exam: 09/03/2022 Medical Rec #:  [id number]      Height:       76.0 in Accession #:    [id number]     Weight:       200.4 lb Date of Birth:  1956-05-14       BSA:          2.217 m Patient Age:    44 years       BP:           93/68 mmHg Patient Gender: M              HR:           91 bpm. Exam Location:  Inpatient Procedure: 2D Echo and Intracardiac Opacification Agent Indications:    CHF  History:        Patient has prior history of Echocardiogram examinations, most                 recent 06/15/2022. Risk Factors:Diabetes and Dyslipidemia.  Sonographer:    Harvie Junior Referring Phys: [id number] Donita Brooks  Sonographer Comments: Suboptimal parasternal window. IMPRESSIONS  1. Left  ventricular ejection fraction, by estimation, is 30 to 35%. The left ventricle has moderately decreased function. The left ventricle demonstrates regional wall motion abnormalities (see scoring diagram/findings for description). The left ventricular internal cavity size was mildly dilated. Left ventricular diastolic parameters are consistent with Grade II diastolic dysfunction (pseudonormalization).  2. Right ventricular systolic function is moderately reduced. The right ventricular size is mildly enlarged. There is mildly elevated pulmonary artery systolic pressure. The estimated right ventricular systolic pressure is 38.4 mmHg.  3. Left atrial size was moderately dilated.  4. Right atrial size was mildly dilated.  5. The mitral valve is abnormal. Moderate mitral valve regurgitation. No evidence of mitral stenosis.  6. The aortic valve is tricuspid. There is mild  calcification of the aortic valve. Aortic valve regurgitation is not visualized. Aortic valve sclerosis is present, with no evidence of aortic valve stenosis.  7. Aortic dilatation noted. There is mild dilatation of the aortic root, measuring 42 mm.  8. The inferior vena cava is normal in size with <50% respiratory variability, suggesting right atrial pressure of 8 mmHg. Comparison(s): Changes from prior study are noted. EF significantly reduced compared to prior, now with more prominent anterior wall motion abnormalities. Findings communicated to PCCM team. Conclusion(s)/Recommendation(s): No left ventricular mural or apical thrombus/thrombi. FINDINGS  Left Ventricle: Left ventricular ejection fraction, by estimation, is 30 to 35%. The left ventricle has moderately decreased function. The left ventricle demonstrates regional wall motion abnormalities. Definity contrast agent was given IV to delineate the left ventricular endocardial borders. The left ventricular internal cavity size was mildly dilated. There is no left ventricular hypertrophy. Left  ventricular diastolic parameters are consistent with Grade II diastolic dysfunction (pseudonormalization).  LV Wall Scoring: The mid and distal anterior wall and apical lateral segment are akinetic. The antero-lateral wall, posterior wall, basal anteroseptal segment, basal anterior segment, basal inferior segment, basal inferoseptal segment, and apex are hypokinetic. The mid and distal anterior septum, mid and distal inferior wall, and mid inferoseptal segment are normal. Right Ventricle: The right ventricular size is mildly enlarged. Right vetricular wall thickness was not well visualized. Right ventricular systolic function is moderately reduced. There is mildly elevated pulmonary artery systolic pressure. The tricuspid  regurgitant velocity is 3.03 m/s, and with an assumed right atrial pressure of 8 mmHg, the estimated right ventricular systolic pressure is 16.1 mmHg. Left Atrium: Left atrial size was moderately dilated. Right Atrium: Right atrial size was mildly dilated. Pericardium: There is no evidence of pericardial effusion. Mitral Valve: The mitral valve is abnormal. Moderate mitral valve regurgitation. No evidence of mitral valve stenosis. Tricuspid Valve: The tricuspid valve is normal in structure. Tricuspid valve regurgitation is mild . No evidence of tricuspid stenosis. Aortic Valve: The aortic valve is tricuspid. There is mild calcification of the aortic valve. Aortic valve regurgitation is not visualized. Aortic valve sclerosis is present, with no evidence of aortic valve stenosis. Aortic valve mean gradient measures 2.0 mmHg. Aortic valve peak gradient measures 3.5 mmHg. Aortic valve area, by VTI measures 3.12 cm. Pulmonic Valve: The pulmonic valve was not well visualized. Pulmonic valve regurgitation is not visualized. No evidence of pulmonic stenosis. Aorta: Aortic dilatation noted. There is mild dilatation of the aortic root, measuring 42 mm. Venous: The inferior vena cava is normal in size with  less than 50% respiratory variability, suggesting right atrial pressure of 8 mmHg. IAS/Shunts: The atrial septum is grossly normal. Additional Comments: There is a small pleural effusion in both left and right lateral regions.  LEFT VENTRICLE PLAX 2D LVIDd:         5.00 cm      Diastology LVIDs:         4.50 cm      LV e' medial:    5.11 cm/s LV PW:         0.90 cm      LV E/e' medial:  15.5 LV IVS:        0.90 cm      LV e' lateral:   14.10 cm/s LVOT diam:     2.40 cm      LV E/e' lateral: 5.6 LV SV:         47 LV SV Index:   21 LVOT Area:  4.52 cm  LV Volumes (MOD) LV vol d, MOD A2C: 189.0 ml LV vol d, MOD A4C: 177.0 ml LV vol s, MOD A2C: 151.0 ml LV vol s, MOD A4C: 116.0 ml LV SV MOD A2C:     38.0 ml LV SV MOD A4C:     177.0 ml LV SV MOD BP:      54.7 ml RIGHT VENTRICLE RV Basal diam:  5.00 cm RV Mid diam:    4.00 cm TAPSE (M-mode): 1.0 cm LEFT ATRIUM              Index        RIGHT ATRIUM           Index LA diam:        4.20 cm  1.89 cm/m   RA Area:     20.30 cm LA Vol (A2C):   81.9 ml  36.94 ml/m  RA Volume:   65.30 ml  29.45 ml/m LA Vol (A4C):   107.0 ml 48.26 ml/m LA Biplane Vol: 100.0 ml 45.10 ml/m  AORTIC VALVE                    PULMONIC VALVE AV Area (Vmax):    2.66 cm     PV Vmax:       0.39 m/s AV Area (Vmean):   2.78 cm     PV Peak grad:  0.6 mmHg AV Area (VTI):     3.12 cm AV Vmax:           93.63 cm/s AV Vmean:          64.867 cm/s AV VTI:            0.151 m AV Peak Grad:      3.5 mmHg AV Mean Grad:      2.0 mmHg LVOT Vmax:         55.03 cm/s LVOT Vmean:        39.867 cm/s LVOT VTI:          0.104 m LVOT/AV VTI ratio: 0.69  AORTA Ao Root diam: 4.40 cm Ao Asc diam:  4.20 cm MITRAL VALVE                  TRICUSPID VALVE MV Area (PHT): 3.59 cm       TR Peak grad:   36.7 mmHg MV Decel Time: 211 msec       TR Mean grad:   22.0 mmHg MR Peak grad:    56.6 mmHg    TR Vmax:        303.00 cm/s MR Mean grad:    35.0 mmHg    TR Vmean:       218.0 cm/s MR Vmax:         376.00 cm/s MR Vmean:         269.0 cm/s   SHUNTS MR PISA:         3.08 cm     Systemic VTI:  0.10 m MR PISA Eff ROA: 20 mm       Systemic Diam: 2.40 cm MR PISA Radius:  0.70 cm MV E velocity: 79.38 cm/s MV A velocity: 35.60 cm/s MV E/A ratio:  2.23 Buford Dresser MD Electronically signed by Buford Dresser MD Signature Date/Time: 09/03/2022/3:19:09 PM    Final    [place] EKG SITE RITE  Result Date: 09/03/2022 If Site Rite image not attached, placement could not be confirmed due to current cardiac rhythm.  CT Angio  Chest Pulmonary Embolism (PE) W or WO Contrast  Result Date: 09/03/2022 CLINICAL DATA:  Pulmonary embolism suspected, with positive D-dimer, hypotension. History of diabetes, ischemic cardiomyopathy, COVID positive 08/02/2022 EXAM: CT ANGIOGRAPHY CHEST WITH CONTRAST TECHNIQUE: Multidetector CT imaging of the chest was performed using the standard protocol during bolus administration of intravenous contrast. Multiplanar CT image reconstructions and MIPs were obtained to evaluate the vascular anatomy. RADIATION DOSE REDUCTION: This exam was performed according to the departmental dose-optimization program which includes automated exposure control, adjustment of the mA and/or kV according to patient size and/or use of iterative reconstruction technique. CONTRAST:  121m OMNIPAQUE IOHEXOL 350 MG/ML SOLN COMPARISON:  Portable chest today, portable chest and chest CT no contrast 08/02/2022, and chest CT without contrast 07/03/2022. FINDINGS: Cardiovascular: Overall borderline cardiomegaly again noted with mild biatrial chamber prominence. There are three-vessel coronary artery calcifications heaviest in the right and LAD arteries, moderate patchy calcific plaques in the aorta and scattered calcific plaques in the great vessels. The pulmonary trunk is 3.5 cm indicating arterial hypertension. No arterial embolus is seen through the segmental divisions. The subsegmental arterial bed is not evaluated due to breathing motion  obscuring visualization of vessels. The superior pulmonary veins are slightly distended but no more than previously. There is no pericardial effusion. Mediastinum/Nodes: Slightly prominent right paratracheal and subcarinal lymph nodes up to 1.1 cm in short axis, noted previously and unchanged. There are calcified left hilar nodes. There are borderline prominent right hilar nodes, largest 9 mm in short axis. The thyroid gland, thoracic trachea, main airways, thoracic esophagus and axillary spaces are unremarkable. No further adenopathy. Lungs/Pleura: Small to moderate-sized bilateral symmetric layering pleural effusions are slightly improved compared with the prior CTs and are likely chronic. There is no pneumothorax. There is slight interstitial edema in the base of the lungs but this has improved. There is diffuse bronchial thickening which has increased. There is compressive atelectasis, less likely consolidation in the posterior lungs alongside the effusions. A calcified lingular granuloma is again noted. The aerated lungs anterior to the pleural effusions are generally clear. Upper Abdomen: Small amount of perihepatic ascites, new. Cholelithiasis is partially visible. Pancreatic atrophy. No other significant upper abdominal finding. Musculoskeletal: No chest wall abnormality. No acute or significant osseous findings. There are mild degenerative changes in the lower thoracic spine. Review of the MIP images confirms the above findings. IMPRESSION: 1. No evidence of arterial embolus through the segmental divisions. Subsegmental arterial bed obscured by breathing motion. 2. Borderline cardiomegaly with mild biatrial chamber prominence, slight distention of the superior pulmonary veins, and slight interstitial edema in the base of the lungs. 3. Small to moderate-sized layering pleural effusions with compressive atelectasis, less likely consolidation in the posterior lungs alongside the effusions. 4. Diffuse bronchial  thickening which could be due to bronchitis, reactive airways disease or fluid overload, interval increased since 08/02/2022. 5. Aortic and coronary artery atherosclerosis. 6. Slightly prominent mediastinal and right hilar nodes, unchanged. 7. Small amount of perihepatic ascites, new. 8. Cholelithiasis. Aortic Atherosclerosis (ICD10-I70.0). Electronically Signed   By: KTelford NabM.D.   On: 09/03/2022 00:21   DG CHEST PORT 1 VIEW  Result Date: 09/02/2022 CLINICAL DATA:  Heart failure with reduced ejection fraction. No current shortness of breath or chest pain. [id number] EXAM: PORTABLE CHEST 1 VIEW COMPARISON:  Chest CT without contrast and portable chest both dated 08/02/2022. FINDINGS: 5:56 p.m. Heart silhouette is normal in size. Coronary artery and aortic calcifications are again visible. There is central vascular prominence, mild  interstitial edema in the lung bases bilateral small pleural effusions. There is haziness in the lower lung fields which could be atelectasis, pneumonitis or ground-glass edema. The mid and upper lung fields are clear. Similar findings were noted on the prior studies, although the interstitial edema was considerably greater previously. IMPRESSION: 1. Mild interstitial edema in the lung bases with small pleural effusions. 2. Haziness in the lower lung fields which could be atelectasis, pneumonitis or ground-glass edema. 3. Aortic atherosclerosis and coronary artery calcifications. 4. Compare: As above. Electronically Signed   By: Telford Nab M.D.   On: 09/02/2022 20:23   VAS [place] LOWER EXTREMITY VENOUS (DVT)  Result Date: 09/02/2022  Lower Venous DVT Study Patient Name:  Peter Yang  Date of Exam:   09/02/2022 Medical Rec #: [id number]       Accession #:    [id number] Date of Birth: 10-27-1955        Patient Gender: M Patient Age:   68 years Exam Location:  [facility] Procedure:      VAS [place] LOWER EXTREMITY VENOUS (DVT) Referring Phys: Noe Yang  --------------------------------------------------------------------------------  Indications: Swelling.  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 Limitations: Poor ultrasound/tissue interface and patient positioning. Comparison Study: No prior studies. Performing Technologist: Oliver Hum RVT  Examination Guidelines: A complete evaluation includes B-mode imaging, spectral Doppler, color Doppler, and power Doppler as needed of all accessible portions of each vessel. Bilateral testing is considered an integral part of a complete examination. Limited examinations for reoccurring indications may be performed as noted. The reflux portion of the exam is performed with the patient in reverse Trendelenburg.  +---------+---------------+---------+-----------+----------+--------------+ RIGHT    CompressibilityPhasicitySpontaneityPropertiesThrombus Aging +---------+---------------+---------+-----------+----------+--------------+ CFV      Full           Yes      Yes                                 +---------+---------------+---------+-----------+----------+--------------+ SFJ      Full                                                        +---------+---------------+---------+-----------+----------+--------------+ FV Prox  Full                                                        +---------+---------------+---------+-----------+----------+--------------+ FV Mid   Full                                                        +---------+---------------+---------+-----------+----------+--------------+ FV DistalFull                                                        +---------+---------------+---------+-----------+----------+--------------+ PFV      Full                                                        +---------+---------------+---------+-----------+----------+--------------+  POP      Full           Yes      Yes                                  +---------+---------------+---------+-----------+----------+--------------+ PTV      Full                                                        +---------+---------------+---------+-----------+----------+--------------+ PERO     Full                                                        +---------+---------------+---------+-----------+----------+--------------+   +---------+---------------+---------+-----------+----------+--------------+ LEFT     CompressibilityPhasicitySpontaneityPropertiesThrombus Aging +---------+---------------+---------+-----------+----------+--------------+ CFV      Full           Yes      Yes                                 +---------+---------------+---------+-----------+----------+--------------+ SFJ      Full                                                        +---------+---------------+---------+-----------+----------+--------------+ FV Prox  Full                                                        +---------+---------------+---------+-----------+----------+--------------+ FV Mid   Full                                                        +---------+---------------+---------+-----------+----------+--------------+ FV DistalFull                                                        +---------+---------------+---------+-----------+----------+--------------+ PFV      Full                                                        +---------+---------------+---------+-----------+----------+--------------+ POP      Full           Yes      Yes                                 +---------+---------------+---------+-----------+----------+--------------+  PTV                                                   BKA            +---------+---------------+---------+-----------+----------+--------------+ PERO                                                  BKA             +---------+---------------+---------+-----------+----------+--------------+     Summary: RIGHT: - There is no evidence of deep vein thrombosis in the lower extremity. However, portions of this examination were limited- see technologist comments above.  - No cystic structure found in the popliteal fossa.  LEFT: - There is no evidence of deep vein thrombosis in the lower extremity. However, portions of this examination were limited- see technologist comments above.  - No cystic structure found in the popliteal fossa.  *See table(s) above for measurements and observations. Electronically signed by Servando Snare MD on 09/02/2022 at 6:04:34 PM.    Final    CT Renal Stone Study  Result Date: 08/31/2022 CLINICAL DATA:  Hematuria, nephrolithiasis, history of prostate cancer EXAM: CT ABDOMEN AND PELVIS WITHOUT CONTRAST TECHNIQUE: Multidetector CT imaging of the abdomen and pelvis was performed following the standard protocol without IV contrast. RADIATION DOSE REDUCTION: This exam was performed according to the departmental dose-optimization program which includes automated exposure control, adjustment of the mA and/or kV according to patient size and/or use of iterative reconstruction technique. COMPARISON:  08/02/2022 FINDINGS: Lower chest: Small bilateral pleural effusions, decreased since prior exam. Minimal dependent lower lobe atelectasis. Hepatobiliary: Calcified gallstones layer dependently within the gallbladder. No evidence of gallbladder wall thickening or pericholecystic fluid. Unremarkable unenhanced appearance of the liver. Pancreas: Unremarkable unenhanced appearance. Spleen: Unremarkable unenhanced appearance. Adrenals/Urinary Tract: No urinary tract calculi. There is mild bilateral hydronephrosis and hydroureter. Foley catheter decompresses the urinary bladder. There is diffuse bladder wall thickening, with high attenuation material within the bladder lumen likely representing blood products given  history of hematuria. There is moderate perivesicular fat stranding. The adrenals are unremarkable. Stomach/Bowel: No bowel obstruction or ileus. Mild wall thickening of the rectosigmoid colon is nonspecific given decompressed state. Vascular/Lymphatic: Aortic atherosclerosis. No enlarged abdominal or pelvic lymph nodes. Reproductive: Prostate appears enlarged, measuring 4.9 x 5.5 cm, with indistinct margins and surrounding fat stranding. Findings are highly concerning for underlying prostatitis. Other: No free fluid or free intraperitoneal gas. No abdominal wall hernia. Musculoskeletal: No acute or destructive bony lesions. Reconstructed images demonstrate no additional findings. IMPRESSION: 1. Inflammatory changes of the bladder and prostate most consistent with underlying cysto-prostatitis. 2. Indwelling Foley catheter, with high attenuation in the bladder lumen likely representing blood products given history of hematuria. 3. Persistent mild bilateral hydronephrosis and hydroureter, likely as a result of inflammatory changes within the prostate and bladder. No radiopaque urinary tract calculi. 4. Cholelithiasis without cholecystitis. 5. Small bilateral pleural effusions, decreased since prior study. 6. Nonspecific wall thickening of the distal rectosigmoid colon. This could be due to decompressed state, the underlying proctitis could give a similar appearance. 7.  Aortic Atherosclerosis (ICD10-I70.0). Electronically Signed   By: Randa Ngo M.D.   On: 08/31/2022 18:27  Assessment and Plan:   1. Gross hematuria with AKI 2. Persistent hypotension, initially felt due to deptic shock but with question of concomitant cardiogenic shock 3. Acute on chronic HFrEF 4. Severe unrevascularizable CAD with multiple ischemic insults by troponin this year, PCI 01/2022 but unable to be on DAPT any further due to hematuria issues this admission - elevated troponin this admission again likely due to demand  ischemia 5. Moderate MR, aortic sclerosis without stenosis 6. H/o PAF dx 05/2022, currently NSR with PACs, no longer on anticoagulation due to #1 7. Severe protein calorie malnutrition 8. Poor social support 9. Scrotal cellulitis, sacral pressure ulcer  Very complex, difficult situation with limited options for therapy. Given infectious issues and poor social support he does not appear to be a candidate for advanced therapies. Remains pressor dependent at this time on levophed, dobutamine, midodrine, and getting IV Lasix. Will discuss complex situation with Dr. Gasper Sells.   Risk Assessment/Risk Scores:     TIMI Risk Score for Unstable Angina or Non-ST Elevation MI:   [name] TIMI risk score is 4, which indicates a 20% risk of all cause mortality, new or recurrent myocardial infarction or need for urgent revascularization in the next 14 days.  New York Heart Association (NYHA) Functional Class NYHA Class III-IV  CHA2DS2-VASc Score = 7   This indicates a 11.2% annual risk of stroke. [name] score is based upon: CHF History: 1 HTN History: 1 Diabetes History: 1 Stroke History: 2 Vascular Disease History: 1 Age Score: 1 Gender Score: 0         For questions or updates, please contact [place] Please consult [email or web address] for contact info under    Signed, Charlie Pitter, PA-C  09/04/2022 9:36 AM   Personally seen and examined. Agree with APP above with the following comments: Patient feels fine and wants to go home. He is adamant he has never . Has CAD with prior PTCA and PCI of the RCA in 01/2022, prior 95% stenosis of dominant branch of large D1, CTO of later branch of D1, CTO of proximal LCX, CTO of proximal to mid RCA including of prior stent in 01/2022 with left to right collaterals. He was deemed not a candidate for CABG and disease was not amenable to PCI.  EF has continued to worsen, last at 30% Came in with occult blood. Has biventricular  dysfunction. HE has no friends or family. Makes all his decisions on his own. Venous Co-ox was 51 -> 74 on therapy. Still on pressors. Low albumin. Lactate 2 Troponon 771. He is not a candidate for transplant or LVAD. He appears to be in a low output state. I am unclear that support would be a bridge to durable recovery. HE is s/p BKA Little insight to disease. I worry he would not do well on home inotropes.  I have addressed this with him at length Daily CoOX Heparin challenge, may add DAPT If Afib; IV amiodarone Transduce CVP in AM Continue diuresis HE has a very guarded prognosis.  CRITICAL CARE Performed by: Kaylamarie Swickard A Marcee Jacobs  Total critical care time: 70  minutes. Critical care time was exclusive of separately billable procedures and treating other patients. Critical care was necessary to treat or prevent imminent or life-threatening deterioration. Critical care was time spent personally by me on the following activities: development of treatment plan with patient and/or surrogate as well as nursing, discussions with consultants, evaluation of patient's response to treatment, examination of patient, obtaining history from  patient or surrogate, ordering and performing treatments and interventions, ordering and review of laboratory studies, ordering and review of radiographic studies, pulse oximetry and re-evaluation of patient's condition.    Signed, Rudean Haskell, [place]  09/04/2022 3:21 PM

## 2022-09-05 DIAGNOSIS — I959 Hypotension, unspecified: Secondary | ICD-10-CM | POA: Diagnosis not present

## 2022-09-05 DIAGNOSIS — R570 Cardiogenic shock: Secondary | ICD-10-CM

## 2022-09-05 DIAGNOSIS — R310 Gross hematuria: Secondary | ICD-10-CM | POA: Diagnosis not present

## 2022-09-05 DIAGNOSIS — N179 Acute kidney failure, unspecified: Secondary | ICD-10-CM | POA: Diagnosis not present

## 2022-09-05 DIAGNOSIS — I2510 Atherosclerotic heart disease of native coronary artery without angina pectoris: Secondary | ICD-10-CM | POA: Diagnosis not present

## 2022-09-05 DIAGNOSIS — J90 Pleural effusion, not elsewhere classified: Secondary | ICD-10-CM | POA: Diagnosis not present

## 2022-09-05 DIAGNOSIS — I5023 Acute on chronic systolic (congestive) heart failure: Secondary | ICD-10-CM | POA: Diagnosis not present

## 2022-09-05 DIAGNOSIS — R5381 Other malaise: Secondary | ICD-10-CM | POA: Diagnosis not present

## 2022-09-05 LAB — BASIC METABOLIC PANEL
Anion gap: 6 (ref 5–15)
BUN: 30 mg/dL — ABNORMAL HIGH (ref 8–23)
CO2: 22 mmol/L (ref 22–32)
Calcium: 7.5 mg/dL — ABNORMAL LOW (ref 8.9–10.3)
Chloride: 101 mmol/L (ref 98–111)
Creatinine, Ser: 0.91 mg/dL (ref 0.61–1.24)
GFR, Estimated: >60 mL/min (ref >60)
Glucose, Bld: 346 mg/dL — ABNORMAL HIGH (ref 70–99)
Potassium: 3.5 mmol/L (ref 3.5–5.1)
Sodium: 129 mmol/L — ABNORMAL LOW (ref 135–145)

## 2022-09-05 LAB — MAGNESIUM: Magnesium: 1.7 mg/dL (ref 1.7–2.4)

## 2022-09-05 LAB — CULTURE, BLOOD (ROUTINE X 2)
Culture: NO GROWTH
Special Requests: ADEQUATE

## 2022-09-05 LAB — GLUCOSE, CAPILLARY
Glucose-Capillary: 251 mg/dL — ABNORMAL HIGH (ref 70–99)
Glucose-Capillary: 189 mg/dL — ABNORMAL HIGH (ref 70–99)
Glucose-Capillary: 266 mg/dL — ABNORMAL HIGH (ref 70–99)
Glucose-Capillary: 206 mg/dL — ABNORMAL HIGH (ref 70–99)
Glucose-Capillary: 222 mg/dL — ABNORMAL HIGH (ref 70–99)

## 2022-09-05 LAB — COOXEMETRY PANEL
Carboxyhemoglobin: 1.2 % (ref 0.5–1.5)
Methemoglobin: <0.7 % (ref 0.0–1.5)
O2 Saturation: 63.4 %
Total hemoglobin: 7.8 g/dL — ABNORMAL LOW (ref 12.0–16.0)

## 2022-09-05 LAB — CBC
HCT: 23.7 % — ABNORMAL LOW (ref 39.0–52.0)
Hemoglobin: 7.3 g/dL — ABNORMAL LOW (ref 13.0–17.0)
MCH: 26.3 pg (ref 26.0–34.0)
MCHC: 30.8 g/dL (ref 30.0–36.0)
MCV: 85.3 fL (ref 80.0–100.0)
Platelets: 162 10*3/uL (ref 150–400)
RBC: 2.78 MIL/uL — ABNORMAL LOW (ref 4.22–5.81)
RDW: 16.1 % — ABNORMAL HIGH (ref 11.5–15.5)
WBC: 6.5 10*3/uL (ref 4.0–10.5)
nRBC: 0 % (ref 0.0–0.2)

## 2022-09-05 LAB — HEPARIN LEVEL (UNFRACTIONATED)
Heparin Unfractionated: 0.41 IU/mL (ref 0.30–0.70)
Heparin Unfractionated: 0.53 IU/mL (ref 0.30–0.70)

## 2022-09-05 MED ORDER — POTASSIUM CHLORIDE CRYS ER 20 MEQ PO TBCR
40.0000 meq | EXTENDED_RELEASE_TABLET | ORAL | Status: AC
  Administered 2022-09-05 (×2): 40 meq via ORAL
  Filled 2022-09-05 (×2): qty 2

## 2022-09-05 MED ORDER — SODIUM CHLORIDE 0.9 % IV SOLN
1.0000 g | INTRAVENOUS | Status: AC
  Administered 2022-09-05 – 2022-09-29 (×25): 1000 mg via INTRAVENOUS
  Filled 2022-09-05 (×27): qty 1

## 2022-09-05 MED ORDER — MAGNESIUM SULFATE 2 GM/50ML IV SOLN
2.0000 g | Freq: Once | INTRAVENOUS | Status: AC
  Administered 2022-09-05: 2 g via INTRAVENOUS
  Filled 2022-09-05: qty 50

## 2022-09-05 MED ORDER — SPIRONOLACTONE 25 MG PO TABS
25.0000 mg | ORAL_TABLET | Freq: Every day | ORAL | Status: DC
  Administered 2022-09-05 – 2022-09-07 (×3): 25 mg via ORAL
  Filled 2022-09-05 (×3): qty 1

## 2022-09-05 MED ORDER — INSULIN ASPART 100 UNIT/ML IJ SOLN
5.0000 [IU] | Freq: Three times a day (TID) | INTRAMUSCULAR | Status: DC
  Administered 2022-09-05 – 2022-09-09 (×10): 5 [IU] via SUBCUTANEOUS

## 2022-09-05 MED ORDER — INSULIN ASPART 100 UNIT/ML IJ SOLN
0.0000 [IU] | Freq: Every day | INTRAMUSCULAR | Status: DC
  Administered 2022-09-05 – 2022-09-10 (×2): 2 [IU] via SUBCUTANEOUS
  Administered 2022-09-11: 3 [IU] via SUBCUTANEOUS
  Administered 2022-09-12 – 2022-10-10 (×11): 2 [IU] via SUBCUTANEOUS
  Administered 2022-10-11: 4 [IU] via SUBCUTANEOUS
  Administered 2022-10-12 – 2022-10-14 (×2): 2 [IU] via SUBCUTANEOUS
  Administered 2022-10-15: 3 [IU] via SUBCUTANEOUS
  Administered 2022-10-16 – 2022-10-19 (×4): 2 [IU] via SUBCUTANEOUS
  Administered 2022-10-20: 3 [IU] via SUBCUTANEOUS
  Administered 2022-10-21 – 2022-10-22 (×2): 2 [IU] via SUBCUTANEOUS

## 2022-09-05 MED ORDER — INSULIN ASPART 100 UNIT/ML IJ SOLN: 0.0000 [IU] | Freq: Three times a day (TID) | INTRAMUSCULAR | Status: DC

## 2022-09-05 MED ORDER — SODIUM BICARBONATE 650 MG PO TABS
650.0000 mg | ORAL_TABLET | Freq: Two times a day (BID) | ORAL | Status: DC
  Administered 2022-09-05 – 2022-09-06 (×2): 650 mg via ORAL
  Filled 2022-09-05 (×2): qty 1

## 2022-09-05 MED ORDER — TRAZODONE HCL 50 MG PO TABS
50.0000 mg | ORAL_TABLET | Freq: Every day | ORAL | Status: DC
  Administered 2022-09-05 – 2022-09-13 (×9): 50 mg via ORAL
  Filled 2022-09-05 (×9): qty 1

## 2022-09-05 MED ORDER — PREGABALIN 25 MG PO CAPS: 50.0000 mg | ORAL_CAPSULE | Freq: Once | ORAL | Status: DC

## 2022-09-05 MED ORDER — DULOXETINE HCL 30 MG PO CPEP
30.0000 mg | ORAL_CAPSULE | Freq: Once | ORAL | Status: AC
  Administered 2022-09-06: 30 mg via ORAL
  Filled 2022-09-05: qty 1

## 2022-09-05 MED ORDER — INSULIN DETEMIR 100 UNIT/ML ~~LOC~~ SOLN
20.0000 [IU] | Freq: Two times a day (BID) | SUBCUTANEOUS | Status: DC
  Administered 2022-09-05 – 2022-09-10 (×10): 20 [IU] via SUBCUTANEOUS
  Filled 2022-09-05 (×11): qty 0.2

## 2022-09-05 MED ORDER — POTASSIUM CHLORIDE CRYS ER 20 MEQ PO TBCR
40.0000 meq | EXTENDED_RELEASE_TABLET | Freq: Once | ORAL | Status: AC
  Administered 2022-09-05: 40 meq via ORAL
  Filled 2022-09-05: qty 2

## 2022-09-05 MED ORDER — INSULIN ASPART 100 UNIT/ML IJ SOLN: 2.0000 [IU] | Freq: Three times a day (TID) | INTRAMUSCULAR | Status: DC

## 2022-09-05 MED ORDER — FUROSEMIDE 10 MG/ML IJ SOLN
60.0000 mg | Freq: Three times a day (TID) | INTRAMUSCULAR | Status: AC
  Administered 2022-09-05 – 2022-09-06 (×3): 60 mg via INTRAVENOUS
  Filled 2022-09-05 (×3): qty 6

## 2022-09-05 MED ORDER — INSULIN ASPART 100 UNIT/ML IJ SOLN
0.0000 [IU] | Freq: Three times a day (TID) | INTRAMUSCULAR | Status: DC
  Administered 2022-09-05: 5 [IU] via SUBCUTANEOUS
  Administered 2022-09-05: 8 [IU] via SUBCUTANEOUS
  Administered 2022-09-06: 2 [IU] via SUBCUTANEOUS
  Administered 2022-09-06: 3 [IU] via SUBCUTANEOUS
  Administered 2022-09-06: 5 [IU] via SUBCUTANEOUS
  Administered 2022-09-07: 3 [IU] via SUBCUTANEOUS
  Administered 2022-09-07 – 2022-09-08 (×3): 5 [IU] via SUBCUTANEOUS
  Administered 2022-09-08 – 2022-09-09 (×3): 3 [IU] via SUBCUTANEOUS
  Administered 2022-09-09: 5 [IU] via SUBCUTANEOUS
  Administered 2022-09-09: 2 [IU] via SUBCUTANEOUS
  Administered 2022-09-10: 3 [IU] via SUBCUTANEOUS
  Administered 2022-09-10: 2 [IU] via SUBCUTANEOUS
  Administered 2022-09-10: 3 [IU] via SUBCUTANEOUS
  Administered 2022-09-11: 8 [IU] via SUBCUTANEOUS
  Administered 2022-09-11 (×2): 5 [IU] via SUBCUTANEOUS
  Administered 2022-09-12 (×3): 8 [IU] via SUBCUTANEOUS
  Administered 2022-09-13 (×2): 5 [IU] via SUBCUTANEOUS
  Administered 2022-09-13: 8 [IU] via SUBCUTANEOUS
  Administered 2022-09-14: 5 [IU] via SUBCUTANEOUS
  Administered 2022-09-14: 3 [IU] via SUBCUTANEOUS
  Administered 2022-09-14: 5 [IU] via SUBCUTANEOUS
  Administered 2022-09-15: 3 [IU] via SUBCUTANEOUS
  Administered 2022-09-15 (×2): 5 [IU] via SUBCUTANEOUS
  Administered 2022-09-16 (×2): 3 [IU] via SUBCUTANEOUS
  Administered 2022-09-16: 2 [IU] via SUBCUTANEOUS

## 2022-09-05 NOTE — Progress Notes (Signed)
ANTICOAGULATION CONSULT NOTE - follow up  Pharmacy Consult for heparin Indication: atrial fibrillation  No Known Allergies  Patient Measurements: Height: '6\' 4"'$  (193 cm) Weight: 102 kg (224 lb 13.9 oz) IBW/kg (Calculated) : 86.8 Heparin Dosing Weight: 95 kg  Vital Signs: Temp: 98 F (36.7 C) (11/15 1942) Temp Source: Oral (11/15 1942) BP: 116/62 (11/16 0200) Pulse Rate: 91 (11/16 0200)  Labs: Recent Labs    09/02/22 1935 09/02/22 1935 09/03/22 0405 09/03/22 1802 09/04/22 0604 09/04/22 1221 09/04/22 1830 09/05/22 0225  HGB  --    < > 8.1*  --  7.6*  --   --  7.3*  HCT  --   --  27.0*  --  24.6*  --   --  23.7*  PLT  --   --  205  --  169  --   --  162  APTT  --   --   --   --   --  32  --   --   LABPROT  --   --   --   --   --  16.4*  --   --   INR  --   --   --   --   --  1.3*  --   --   HEPARINUNFRC  --   --   --   --   --  0.11* 0.22* 0.41  CREATININE 1.04  --  0.96 0.97 0.90  --   --  0.91  TROPONINIHS 771*  --   --   --   --   --   --   --    < > = values in this interval not displayed.     Estimated Creatinine Clearance: 99.4 mL/min (by C-G formula based on SCr of 0.91 mg/dL).   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   Coronary artery disease involving native coronary artery of native heart without angina pectoris 06/25/2022   Diabetic polyneuropathy associated with type 2 diabetes mellitus ([place]) 07/28/2021   Essential hypertension 01/25/2016   Hematuria and +fecal occult  06/25/2022   History of diabetic ulcer of foot 12/31/2019   History of non-ST elevation myocardial infarction (NSTEMI) 06/15/2022   Hx of right coronary artery stent placement 02/11/2022   Hyperlipidemia    Ischemic cardiomyopathy    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 Overview:  Added automatically from request for surgery [id number] Formatting of this note might be different from the original. Added automatically from request for surgery [id number]   Large bowel perforation ([place]) 09/27/2015   Malignant neoplasm of prostate ([place]) 10/22/2019   Myopia with astigmatism and presbyopia, bilateral 06/15/2018   Last Assessment & Plan:  Formatting of this note might be different from the original. - Wrx printed Formatting of this note might be different from the original. 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 - Stable, monitor Formatting of this note might  be different from the original. Last Assessment & Plan:  - Stable, monitor   Retroperitoneal abscess ([place]) 10/02/2015   Status post corneal transplant 10/07/2016   Formatting of this note might be different from the original. Overview:  Added automatically from request for surgery [id number] Formatting of this note might be different from the original. Added automatically from request for surgery [id number]  Last Assessment & Plan:  Formatting of this note might be different from the original. - OD, 2/2 KCN - Stable, no NVK / rejection - Continue off steroids - Mo   Thoracic ascending aortic aneurysm (HCC)    TIA (transient ischemic attack) 2012   Type 2 diabetes mellitus [facility])       Assessment: 66 year old male presented with hematuria of unclear duration. Patient on Eliquis PTA for afib, as well as Plavix for CAD (cath in August 2023, no good targets for PCI - CARDs recommendation at that time for Eliquis + Plavix, stop aspirin). Per med history completed with SNF MAR, it appears patient may  have been on triple therapy since last admission. Anticoagulation was held on admission for hematuria, which has since cleared. Pharmacy consulted to start heparin infusion to re-challenge anticoagulation prior to resuming Eliquis.  Today, 09/05/22 HL 0.41 therapeutic on 1400 units/hr Hgb down to 7.3, plts WNL Per RN no bleeding noted  Goal of Therapy:  Heparin level 0.3-0.7 units/ml Monitor platelets by anticoagulation protocol: Yes   Plan:  -continue heparin infusion rate at 1400 units/hr -Confirmatory level in 6 hours -Daily heparin level and CBC while on heparin infusion -Monitor closely for any signs/symptoms of bleeding, especially recurrence of hematuria  Dolly Rias RPh 09/05/2022, 3:22 AM

## 2022-09-05 NOTE — Progress Notes (Signed)
[facility] ADULT ICU REPLACEMENT PROTOCOL   The patient does apply for the [facility] Adult ICU Electrolyte Replacment Protocol based on the criteria listed below:   1.Exclusion criteria: TCTS, ECMO, Dialysis, and Myasthenia Gravis patients 2. Is GFR >/= 30 ml/min? Yes.    Patient's GFR today is >60 3. Is SCr </= 2? Yes.   Patient's SCr is 0.91 mg/dL 4. Did SCr increase >/= 0.5 in 24 hours? No. 5.Pt's weight >40kg  Yes.   6. Abnormal electrolyte(s): K+ 3.5, Mag 1.7  7. Electrolytes replaced per protocol 8.  Call MD STAT for K+ </= 2.5, Phos </= 1, or Mag </= 1 Physician:  Ed Blalock [facility] 09/05/2022 3:34 AM

## 2022-09-05 NOTE — Progress Notes (Signed)
NAME:  Peter Yang, MRN:  [id number], DOB:  01/29/1956, LOS: 4 ADMISSION DATE:  08/31/2022, CONSULTATION DATE:  11/11 REFERRING MD:  Lelon Mast FOR CONSULT: sepsis   History of Present Illness:  Peter Yang is an 66 y.o. M who presented to the [facility] ED with a chief complaint of hematuria.  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  Sent to the emergency department SNF staff on 11/11.  At the emergency department he developed hypotension with a BP of 83/63. WBC 14.2, lactate 5.0 > 7.9.  Code sepsis was called.  Blood cultures and urine cultures were obtained.  Patient was given fluid bolus per sepsis guidelines.  Patient with complicated infectious history-started on meropenem and amoxicillin by [facility].  Urology was consulted for hematuria and concern for forniers.  Recommends monitoring with possible cystoscopy once Eliquis washout.  [facility] admitted patient.  PCCM was consulted for assistance with sepsis with high lactate.  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 He denies new complaints. No SOB.  Objective   Blood pressure 98/69, pulse 93, temperature 98.3 F (36.8 C),  temperature source Axillary, resp. rate 17, height '6\' 4"'$  (1.93 m), weight 102 kg, SpO2 97 %. CVP:  [8 mmHg-16 mmHg] 9 mmHg      Intake/Output Summary (Last 24 hours) at 09/05/2022 1006 Last data filed at 09/05/2022 2878 Gross per 24 hour  Intake 1216.79 ml  Output 3875 ml  Net -2658.21 ml    Filed Weights   09/01/22 0800 09/02/22 0315 09/04/22 0530  Weight: 90.9 kg 94.8 kg 102 kg   Examination: General: Chronically ill-appearing man sitting up in bed in no acute distress, fatigued appearing HEENT: [place]/AT, eyes anicteric Neuro: Awake and alert, moving all extremities.  Answering questions appropriately.  Normal speech. Psych: Flattened affect, cooperative with exam CV: S1-S2, regular rate and rhythm PULM: Reduced basilar breath sounds, clear anteriorly.  No conversational dyspnea.  Breathing comfortably on room air. GI: Soft, nontender Extremities: No significant right lower extremity edema.  Left lower extremity BKA. Derm: Warm, dry, pallor  I/O -2.2L (+1L for admission)   Coox 63% Na+ 129 BUN 30 Cr 0.91 WBC 6.5 H/H 7.3/23.7 Platelets 162  BG in [facility] Problem list   hypophosphatemia  Assessment & Plan:   Shock, initially suspected septic from UTI, now looking more likely cardiogenic shock. All cultures have remained negative this admission and he has been on antibiotics for weeks pre-dating this. Acute drop in EF, low coox. Acute on chronic HFrEF, ICM, CAD with stent in 05/2022 Lactic acidosis, secondary to above -Continue PICC line.  Daily cooximetry. -Continue dobutamine-2.5 mcg.  Norepinephrine titrating as required to maintain MAP greater than  64.   -Appreciate cardiology's management.  Unfortunately not a good candidate for revascularization.  Not tolerating most HFrEF medications now due to shock. - Can trial spironolactone -Monitor on telemetry - Monitor electrolytes and replete as needed.  Additional electrolytes this afternoon to prevent  dropping. - Daily statin.   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  Plans for him to follow-up with infectious disease team in [facility] as he previously has established care with them. - Not been moving around can have voiding trial per urology   Hematuria due to UTI Known prostate cancer, under surveillance with urology -Continue heparin.  Continue holding Eliquis. - Continue holding Plavix-hopefully can resume tomorrow if his hemoglobin does not significantly drop or no signs of hematuria recurring. - Eventually need cystoscopy per urology - Continue Flomax and finasteride. - Needs voiding trial once up and moving around.   Hyperglycemia, uncontrolled -levemir increased to 20 units twice daily - Increasing mealtime insulin to 5 units 3 times daily AC - Sliding scale insulin - Goal blood glucose 140-180   Hyponatremia, hypervolemic Ascites, bilateraly pleural effusions -Increase diuresis to 3 times daily, spironolactone   Non-anion gap metabolic acidosis - Decrease sodium bicarb to twice daily.  Deconditioning -Needs PT, OT> reaching out to determine why he is not an appropriate candidate for therapy this week. Orders replaced since they were cancelled. -Has prosthesis at bedside and can do OOB mobility  Chronic Stage 2 sacral ulcer -Continue wound care  Insomnia, phantom limb pain LLE -Continue increased nightly gabapentin dose -Continue melatonin QHS -trazodone scheduled at bedtime -Appreciate palliative care's management-added Ambien     Best Practice (right click and "Reselect all SmartList Selections" daily)  Diet/type: Regular consistency  DVT prophylaxis: SCD's, systemic heparin GI prophylaxis: PPI- home med Lines: PICC Foley:  yes and it is still needed Code Status:  DNR   Critical care time:      This patient is  critically ill with multiple organ system failure which requires frequent high complexity decision making, assessment, support, evaluation, and titration of therapies. This was completed through the application of advanced monitoring technologies and extensive interpretation of multiple databases. During this encounter critical care time was devoted to patient care services described in this note for 32 minutes.  Julian Hy, DO 09/05/22 10:48 AM [facility]
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
## 2022-09-05 NOTE — Progress Notes (Signed)
[facility] Progress Note Patient Name: Peter Yang DOB: [birth year] MRN: [id number]   Date of Service  09/05/2022  HPI/Events of Note  Patient c/o phantom limb pain 9/10. Gabapentin dose increased today, however, has not helped.   eICU Interventions  Plan: Cymbalta 30 mg PO X 1.         Mayme Profeta Cornelia Copa 09/05/2022, 11:49 PM

## 2022-09-05 NOTE — Progress Notes (Signed)
Daily Progress Note   Patient Name: Peter Yang       Date: 09/05/2022 DOB: 1956-08-24  Age: 66 y.o. MRN#: [id number] Attending Physician: Julian Hy, DO Primary Care Physician: Clovia Cuff, MD Admit Date: 08/31/2022  Reason for Consultation/Follow-up: Establishing goals of care  Patient Profile/HPI: 66 year old man, from [place], admitted to the hospital 08/31/2022 because of hematuria. He has a past medical history of CAD/NSTEMI, AAA, TIA, PAF on Eliquis, diabetes, hypertension, heart failure with reduced ejection fraction left ventricular ejection fraction 40-45%, history of recent COVID. Patient admitted to hospital medicine service because of a complicated UTI and hematuria, cultures positive for ESBL, antibiotics adjusted, urology also consulted for scrotal erythema and cellulitis, remains on antibiotics, hospital course complicated by ongoing hypotension requiring peripheral Levophed and Dobutamine support. Patient also on intravenous heparin infusion. PMT consulted for goals of care discussions.  Subjective: Discussed with bedside RN colleague, patient noted resting in bed does not appear to be in distress medication history reviewed Chart reviewed     Review of Systems  Constitutional:  Positive for malaise/fatigue.  Musculoskeletal:  Positive for joint pain.       Endorses night time pain to left stump.  Psychiatric/Behavioral:  The patient has insomnia.     Physical Exam Pulmonary:     Effort: Pulmonary effort is normal.  Musculoskeletal:     Comments: Left BKA.  Skin:    Coloration: Skin is pale.  Neurological:     Mental Status: He is alert and oriented to person, place, and time.            Vital Signs: BP 98/69   Pulse 93   Temp  98.3 F (36.8 C) (Axillary)   Resp 17   Ht '6\' 4"'$  (1.93 m)   Wt 102 kg   SpO2 97%   BMI 27.37 kg/m  SpO2: SpO2: 97 % O2 Device: O2 Device: Room Air O2 Flow Rate:    Intake/output summary:  Intake/Output Summary (Last 24 hours) at 09/05/2022 1007 Last data filed at 09/05/2022 3491 Gross per 24 hour  Intake 1216.79 ml  Output 3875 ml  Net -2658.21 ml    LBM: Last BM Date : 09/01/22 Baseline Weight: Weight: 90.9 kg Most recent weight: Weight: 102 kg   Patient Active Problem List   Diagnosis  Date Noted   Hyponatremia 09/03/2022   Acute prostatitis 09/02/2022   Septic shock ([place]) 09/02/2022   Gross hematuria 08/31/2022   Lactic acidosis 08/31/2022   Cellulitis of scrotum 08/31/2022   Type 2 diabetes mellitus with diabetic peripheral angiopathy without gangrene, without long-term current use of insulin (HCC)    Acute kidney injury ([place])    Impaired functional mobility, balance, gait, and endurance 08/03/2022   Obstructive sleep apnea syndrome 08/03/2022   Pleural effusion, bilateral 08/03/2022   Catheter-associated urinary tract infection ([place]) 08/03/2022   Lab test positive for detection of COVID-19 virus 08/03/2022   History of urinary retention 08/03/2022   Mitral valve regurgitation 08/03/2022   UTI (urinary tract infection) 08/02/2022   Thoracic ascending aortic aneurysm ([place]) 07/23/2022   Borderline low blood pressure determined by examination 07/04/2022   Hematuria 06/25/2022   History of non-ST elevation myocardial infarction (NSTEMI) 06/15/2022   Paroxysmal atrial fibrillation ([place]) 06/15/2022   Dyslipidemia 06/15/2022   PVD (peripheral vascular disease) ([place]) 06/15/2022   Pressure ulcer of sacral region, stage 2 ([place]) 06/15/2022   Acute on chronic HFrEF (heart failure with reduced ejection fraction) ([place]) 06/15/2022   DNR (do not resuscitate) 06/15/2022   Ischemic cardiomyopathy 02/12/2022   Need for assistance with personal care 08/03/2021   Diabetic  polyneuropathy associated with type 2 diabetes mellitus ([place]) 07/28/2021   Prolonged QT interval 07/28/2021   Moderate protein-calorie malnutrition ([place]) 06/29/2021   Chronic insomnia 03/31/2021   BPH with obstruction/lower urinary tract symptoms 02/02/2018   Hyperlipidemia 04/15/2016   Vitamin D deficiency 04/09/2016   Essential hypertension 01/25/2016    Palliative Care Assessment & Plan    Assessment/Recommendations/Plan  Continue current mode of care, time trial of current interventions going on.     spiritual care team for emotional and spiritual support r/t multiple life stressors/losses experienced with the progression of his illness.   Code Status: DNR  Prognosis:  Unable to determine  Discharge Planning: To Be Determined  Care plan was discussed with IDT.  Low MDM Thank you for allowing the Palliative Medicine Team to assist in the care of this patient.  Loistine Chance MD Palliative Medicine   Please contact Palliative Medicine Team phone at [phone] for questions and concerns.

## 2022-09-05 NOTE — Progress Notes (Signed)
[facility]   Reason for visit: Follow up on prostatitis  Interval History: no growth on cultures Day 6 total antibiotics  Physical Exam: Constitutional:  Vitals:   09/05/22 1315 09/05/22 1330  BP: (!) 114/53 (!) 106/55  Pulse: 89 95  Resp: 12 14  Temp:    SpO2: 100% 98%   patient appears in NAD Respiratory: Normal respiratory effort  Review of Systems: Constitutional: negative for fevers and chills  Lab Results  Component Value Date   WBC 6.5 09/05/2022   HGB 7.3 (L) 09/05/2022   HCT 23.7 (L) 09/05/2022   MCV 85.3 09/05/2022   PLT 162 09/05/2022    Lab Results  Component Value Date   CREATININE 0.91 09/05/2022   BUN 30 (H) 09/05/2022   NA 129 (L) 09/05/2022   K 3.5 09/05/2022   CL 101 09/05/2022   CO2 22 09/05/2022    Lab Results  Component Value Date   ALT 124 (H) 09/04/2022   AST 69 (H) 09/04/2022   ALKPHOS 106 09/04/2022     Microbiology: Recent Results (from the past 240 hour(s))  Urine Culture     Status: None   Collection Time: 08/31/22  4:22 PM   Specimen: Urine, Clean Catch  Result Value Ref Range Status   Specimen Description   Final    URINE, CLEAN CATCH Performed at [facility], [place] [address] [place], [place] [address]    Special Requests   Final    NONE Performed at [facility], [place] [address] [place], [place] [address]    Culture   Final    NO GROWTH Performed at [facility], [place] [address], [place], [place] [address]    Report Status 09/02/2022 FINAL  Final  Blood culture (routine x 2)     Status: None   Collection Time: 08/31/22  6:15 PM   Specimen: BLOOD  Result Value Ref Range Status   Specimen Description   Final    BLOOD SITE NOT SPECIFIED Performed at [place] [address] [place], [place] [address]    Special Requests   Final    BOTTLES DRAWN AEROBIC AND ANAEROBIC Blood Culture adequate volume Performed at [place] [address] [place], [place] [address]    Culture   Final    NO GROWTH 5 DAYS Performed at [facility], [place] [address] [place], [place] [address]    Report Status 09/05/2022 FINAL  Final  Blood culture (routine x 2)     Status: None (Preliminary result)   Collection Time: 08/31/22  6:30 PM   Specimen: BLOOD  Result Value Ref Range Status   Specimen Description   Final    BLOOD SITE NOT SPECIFIED Performed at [place] [address] [place], [place] [address]    Special Requests   Final    BOTTLES DRAWN AEROBIC AND ANAEROBIC Blood Culture results may not be optimal due to an inadequate volume of blood received in culture bottles Performed at [place] [address] [place], [place] [address]    Culture   Final    NO GROWTH 4 DAYS Performed at [facility], [place] [address], [place], [place] [address]    Report Status PENDING  Incomplete  MRSA Next Gen by PCR, Nasal     Status: None   Collection Time: 09/01/22 12:00 AM   Specimen: Nasal  Mucosa; Nasal Swab  Result Value Ref Range Status   MRSA by PCR Next Gen NOT DETECTED NOT DETECTED Final    Comment: (NOTE) The GeneXpert MRSA Assay (FDA approved for NASAL specimens only), is one component of a comprehensive MRSA colonization surveillance program. It is not intended to diagnose MRSA infection nor to guide or monitor treatment for MRSA infections. Test performance is not FDA approved in patients less than 47 years old. Performed at [facility], [place] [address] [place], [place] [address]     Impression/Plan:  1. Prostatitis, bacterial - he has a reported history of ESBL E coli based on notes from [facility] but there is no findings in the chart since it appears it was collected as an outpatient.  He was treated with Bactrim which presumably was sensitive and is reported to be cipro resistant so at this point, will need to  continue with carbapenem treatment for the E coli and I recommend a 4 week course, now day 6 since he essentially failed Bactrim and will also cover the Proteus.      Diagnosis: prostatitis  Culture Result: ESBL E coli + Proteus  No Known Allergies  OPAT Orders Discharge antibiotics to be given via PICC line Discharge antibiotics: ertapenem 1 gram once daily Per pharmacy protocol yes Duration: 4 weeks End Date: 09/27/22  [facility] Care Per Protocol: yes  Home health RN for IV administration and teaching; PICC line care and labs.    Labs weekly while on IV antibiotics: _x_ CBC with differential __ BMP __x CMP __ CRP __ ESR __ Vancomycin trough __ CK  _x_ Please pull PIC at completion of IV antibiotics __ Please leave PIC in place until doctor has seen patient or been notified  Fax weekly labs to [phone]  Clinic Follow Up Appt: Can follow up with [facility] since he is established with them

## 2022-09-05 NOTE — Progress Notes (Signed)
ANTICOAGULATION CONSULT NOTE - follow up  Pharmacy Consult for heparin Indication: atrial fibrillation  No Known Allergies  Patient Measurements: Height: '6\' 4"'$  (193 cm) Weight: 102 kg (224 lb 13.9 oz) IBW/kg (Calculated) : 86.8 Heparin Dosing Weight: 95 kg  Vital Signs: Temp: 98.3 F (36.8 C) (11/16 0800) Temp Source: Axillary (11/16 0800) BP: 94/61 (11/16 0830) Pulse Rate: 84 (11/16 0830)  Labs: Recent Labs    09/02/22 1935 09/02/22 1935 09/03/22 0405 09/03/22 1802 09/04/22 0604 09/04/22 1221 09/04/22 1830 09/05/22 0225  HGB  --    < > 8.1*  --  7.6*  --   --  7.3*  HCT  --   --  27.0*  --  24.6*  --   --  23.7*  PLT  --   --  205  --  169  --   --  162  APTT  --   --   --   --   --  32  --   --   LABPROT  --   --   --   --   --  16.4*  --   --   INR  --   --   --   --   --  1.3*  --   --   HEPARINUNFRC  --   --   --   --   --  0.11* 0.22* 0.41  CREATININE 1.04  --  0.96 0.97 0.90  --   --  0.91  TROPONINIHS 771*  --   --   --   --   --   --   --    < > = values in this interval not displayed.     Estimated Creatinine Clearance: 99.4 mL/min (by C-G formula based on SCr of 0.91 mg/dL).  Mediations: Infusion:   sodium chloride Stopped (09/04/22 0558)   sodium chloride Stopped (09/04/22 0855)   DOBUTamine 2.5 mcg/kg/min (09/05/22 0749)   heparin 1,400 Units/hr (09/05/22 0749)   meropenem (MERREM) IV Stopped (09/05/22 0037)   norepinephrine (LEVOPHED) Adult infusion 6 mcg/min (09/05/22 0749)     Assessment: 66 year old male presented with hematuria of unclear duration. Patient on Eliquis PTA for afib, as well as Plavix for CAD (cath in August 2023, no good targets for PCI - CARDs recommendation at that time for Eliquis + Plavix, stop aspirin). Per med history completed with SNF MAR, it appears patient may have been on triple therapy since last admission. Anticoagulation was held on admission for hematuria, which has since cleared. Pharmacy consulted to start  heparin infusion to re-challenge anticoagulation prior to resuming Eliquis.  Today, 09/05/22 HL0.53, remains therapeutic on 1400 units/hr Hgb down to 7.3, plts WNL Per RN no bleeding noted, urine remains pink but is unchanged per RN  Goal of Therapy:  Heparin level 0.3-0.7 units/ml Monitor platelets by anticoagulation protocol: Yes   Plan:  Continue heparin IV infusion at 1400 units/hr Daily heparin level and CBC Monitor closely for any signs/symptoms of bleeding, especially worsening of hematuria   Gretta Arab PharmD, BCPS [facility] main pharmacy [phone] 09/05/2022 8:49 AM

## 2022-09-05 NOTE — Progress Notes (Signed)
PT Cancellation Note  Patient Details Name: Peter Yang MRN: [id number] DOB: [birth year]   Cancelled Treatment:    Reason Eval/Treat Not Completed: Patient declined, states that he had a good nights sleep and he does not want to  mobilize right now. Will check back another time. [place] Office [phone] Weekend [phone]  Claretha Cooper 09/05/2022, 11:35 AM

## 2022-09-05 NOTE — Progress Notes (Signed)
OT Cancellation Note  Patient Details Name: Peter Yang MRN: [id number] DOB: 1956/03/05   Cancelled Treatment:    Reason Eval/Treat Not Completed: Other (comment) OT & PT attempted to see the pt together for the initial evaluation, however he adamantly declined to participate, stating he was too fatigued and was trying to get some sleep. Encouragement and education on the importance of therapy participation was provided, however he continued to decline. He asked for therapy to check back tomorrow.    Leota Sauers, OTR/L 09/05/2022, 11:16 AM

## 2022-09-06 DIAGNOSIS — N179 Acute kidney failure, unspecified: Secondary | ICD-10-CM | POA: Diagnosis not present

## 2022-09-06 DIAGNOSIS — R310 Gross hematuria: Secondary | ICD-10-CM | POA: Diagnosis not present

## 2022-09-06 DIAGNOSIS — I2510 Atherosclerotic heart disease of native coronary artery without angina pectoris: Secondary | ICD-10-CM | POA: Diagnosis not present

## 2022-09-06 DIAGNOSIS — I959 Hypotension, unspecified: Secondary | ICD-10-CM | POA: Diagnosis not present

## 2022-09-06 LAB — BASIC METABOLIC PANEL
Anion gap: 5 (ref 5–15)
BUN: 28 mg/dL — ABNORMAL HIGH (ref 8–23)
CO2: 27 mmol/L (ref 22–32)
Calcium: 8.1 mg/dL — ABNORMAL LOW (ref 8.9–10.3)
Chloride: 102 mmol/L (ref 98–111)
Creatinine, Ser: 0.72 mg/dL (ref 0.61–1.24)
GFR, Estimated: >60 mL/min (ref >60)
Glucose, Bld: 156 mg/dL — ABNORMAL HIGH (ref 70–99)
Potassium: 4.1 mmol/L (ref 3.5–5.1)
Sodium: 134 mmol/L — ABNORMAL LOW (ref 135–145)

## 2022-09-06 LAB — CBC
HCT: 26 % — ABNORMAL LOW (ref 39.0–52.0)
Hemoglobin: 8 g/dL — ABNORMAL LOW (ref 13.0–17.0)
MCH: 26.1 pg (ref 26.0–34.0)
MCHC: 30.8 g/dL (ref 30.0–36.0)
MCV: 84.7 fL (ref 80.0–100.0)
Platelets: 189 10*3/uL (ref 150–400)
RBC: 3.07 MIL/uL — ABNORMAL LOW (ref 4.22–5.81)
RDW: 16.1 % — ABNORMAL HIGH (ref 11.5–15.5)
WBC: 7.7 10*3/uL (ref 4.0–10.5)
nRBC: 0 % (ref 0.0–0.2)

## 2022-09-06 LAB — GLUCOSE, CAPILLARY
Glucose-Capillary: 145 mg/dL — ABNORMAL HIGH (ref 70–99)
Glucose-Capillary: 198 mg/dL — ABNORMAL HIGH (ref 70–99)
Glucose-Capillary: 242 mg/dL — ABNORMAL HIGH (ref 70–99)
Glucose-Capillary: 162 mg/dL — ABNORMAL HIGH (ref 70–99)

## 2022-09-06 LAB — CULTURE, BLOOD (ROUTINE X 2): Culture: NO GROWTH

## 2022-09-06 LAB — BRAIN NATRIURETIC PEPTIDE: B Natriuretic Peptide: 2623.1 pg/mL — ABNORMAL HIGH (ref 0.0–100.0)

## 2022-09-06 LAB — HEPARIN LEVEL (UNFRACTIONATED): Heparin Unfractionated: 0.62 IU/mL (ref 0.30–0.70)

## 2022-09-06 LAB — COOXEMETRY PANEL
Carboxyhemoglobin: 1.5 % (ref 0.5–1.5)
Methemoglobin: 0.9 % (ref 0.0–1.5)
O2 Saturation: 63.4 %
Total hemoglobin: 8.2 g/dL — ABNORMAL LOW (ref 12.0–16.0)

## 2022-09-06 LAB — MAGNESIUM: Magnesium: 2.1 mg/dL (ref 1.7–2.4)

## 2022-09-06 MED ORDER — DEXTROSE 50 % IV SOLN
INTRAVENOUS | Status: AC
  Filled 2022-09-06: qty 50

## 2022-09-06 MED ORDER — APIXABAN 5 MG PO TABS: 5.0000 mg | ORAL_TABLET | Freq: Two times a day (BID) | ORAL | Status: DC

## 2022-09-06 MED ORDER — CLOPIDOGREL BISULFATE 75 MG PO TABS: 75.0000 mg | ORAL_TABLET | Freq: Every day | ORAL | Status: DC

## 2022-09-06 MED ORDER — CLOPIDOGREL BISULFATE 75 MG PO TABS
75.0000 mg | ORAL_TABLET | Freq: Every day | ORAL | Status: DC
  Administered 2022-09-06 – 2022-10-24 (×49): 75 mg via ORAL
  Filled 2022-09-06 (×49): qty 1

## 2022-09-06 MED ORDER — APIXABAN 5 MG PO TABS
5.0000 mg | ORAL_TABLET | Freq: Two times a day (BID) | ORAL | Status: DC
  Administered 2022-09-06 – 2022-10-02 (×53): 5 mg via ORAL
  Filled 2022-09-06 (×53): qty 1

## 2022-09-06 NOTE — Progress Notes (Signed)
Daily Progress Note   Patient Name: Peter Yang       Date: 09/06/2022 DOB: 23-Sep-1956  Age: 66 y.o. MRN#: [id number] Attending Physician: [name], MD Primary Care Physician: Clovia Cuff, MD Admit Date: 08/31/2022  Reason for Consultation/Follow-up: Establishing goals of care  Patient Profile/HPI: 66 year old man, from [place], admitted to the hospital 08/31/2022 because of hematuria. He has a past medical history of CAD/NSTEMI, AAA, TIA, PAF on Eliquis, diabetes, hypertension, heart failure with reduced ejection fraction left ventricular ejection fraction 40-45%, history of recent COVID. Patient admitted to hospital medicine service because of a complicated UTI and hematuria, cultures positive for ESBL, antibiotics adjusted, urology also consulted for scrotal erythema and cellulitis, remains on antibiotics, hospital course complicated by ongoing hypotension requiring peripheral Levophed and Dobutamine support. Patient also on intravenous heparin infusion. PMT consulted for goals of care discussions.  Subjective: Discussed with bedside RN colleague, patient noted resting in bed does not appear to be in distress,denies pain currently.  medication history reviewed Chart reviewed     Review of Systems  Constitutional:  Positive for malaise/fatigue.  Musculoskeletal:  Positive for joint pain.       Endorses night time pain to left stump.  Psychiatric/Behavioral:  The patient has insomnia.     Physical Exam Pulmonary:     Effort: Pulmonary effort is normal.  Musculoskeletal:     Comments: Left BKA.  Skin:    Coloration: Skin is pale.  Neurological:     Mental Status: He is alert and oriented to person, place, and time.            Vital Signs: BP  (!) 100/56   Pulse 92   Temp 97.8 F (36.6 C) (Oral)   Resp 19   Ht '6\' 4"'$  (1.93 m)   Wt 94.5 kg   SpO2 100%   BMI 25.36 kg/m  SpO2: SpO2: 100 % O2 Device: O2 Device: Room Air O2 Flow Rate:    Intake/output summary:  Intake/Output Summary (Last 24 hours) at 09/06/2022 1427 Last data filed at 09/06/2022 1300 Gross per 24 hour  Intake 1557.46 ml  Output 6450 ml  Net -4892.54 ml    LBM: Last BM Date : 09/01/22 Baseline Weight: Weight: 90.9 kg Most recent weight: Weight: 94.5 kg   Patient Active Problem  List   Diagnosis Date Noted   Cardiogenic shock ([place]) 09/05/2022   Physical deconditioning 09/05/2022   Hyponatremia 09/03/2022   Acute prostatitis 09/02/2022   Septic shock ([place]) 09/02/2022   Gross hematuria 08/31/2022   Lactic acidosis 08/31/2022   Cellulitis of scrotum 08/31/2022   Type 2 diabetes mellitus with diabetic peripheral angiopathy without gangrene, without long-term current use of insulin (HCC)    Acute kidney injury ([place])    Impaired functional mobility, balance, gait, and endurance 08/03/2022   Obstructive sleep apnea syndrome 08/03/2022   Pleural effusion, bilateral 08/03/2022   Catheter-associated urinary tract infection ([place]) 08/03/2022   Lab test positive for detection of COVID-19 virus 08/03/2022   History of urinary retention 08/03/2022   Mitral valve regurgitation 08/03/2022   UTI (urinary tract infection) 08/02/2022   Thoracic ascending aortic aneurysm ([place]) 07/23/2022   Borderline low blood pressure determined by examination 07/04/2022   Hematuria 06/25/2022   History of non-ST elevation myocardial infarction (NSTEMI) 06/15/2022   Paroxysmal atrial fibrillation ([place]) 06/15/2022   Dyslipidemia 06/15/2022   PVD (peripheral vascular disease) ([place]) 06/15/2022   Pressure ulcer of sacral region, stage 2 ([place]) 06/15/2022   Acute on chronic HFrEF (heart failure with reduced ejection fraction) ([place]) 06/15/2022   DNR (do not resuscitate) 06/15/2022    Ischemic cardiomyopathy 02/12/2022   Need for assistance with personal care 08/03/2021   Diabetic polyneuropathy associated with type 2 diabetes mellitus ([place]) 07/28/2021   Prolonged QT interval 07/28/2021   Moderate protein-calorie malnutrition ([place]) 06/29/2021   Chronic insomnia 03/31/2021   BPH with obstruction/lower urinary tract symptoms 02/02/2018   Hyperlipidemia 04/15/2016   Vitamin D deficiency 04/09/2016   Essential hypertension 01/25/2016    Palliative Care Assessment & Plan    Assessment/Recommendations/Plan  Continue current mode of care, time trial of current interventions going on.  Patient is on Tylenol and Neurontin, additionally got one-time dose of Cymbalta overnight.  PMT following peripherally, monitoring hospital course and overall disease trajectory of illness and remains available for additional goals of care discussions depending on how the patient does.  spiritual care team for emotional and spiritual support r/t multiple life stressors/losses experienced with the progression of his illness.   Code Status: DNR  Prognosis:  Unable to determine  Discharge Planning: To Be Determined  Care plan was discussed with IDT.  Low MDM Thank you for allowing the Palliative Medicine Team to assist in the care of this patient.  Loistine Chance MD Palliative Medicine   Please contact Palliative Medicine Team phone at [phone] for questions and concerns.

## 2022-09-06 NOTE — Progress Notes (Addendum)
ANTICOAGULATION CONSULT NOTE - follow up  Pharmacy Consult for heparin Indication: atrial fibrillation  No Known Allergies  Patient Measurements: Height: '6\' 4"'$  (193 cm) Weight: 94.5 kg (208 lb 5.4 oz) IBW/kg (Calculated) : 86.8 Heparin Dosing Weight: 95 kg  Vital Signs: Temp: 97.7 F (36.5 C) (11/17 0400) Temp Source: Oral (11/17 0400) BP: 91/57 (11/17 0700) Pulse Rate: 87 (11/17 0700)  Labs: Recent Labs    09/04/22 0604 09/04/22 1221 09/04/22 1830 09/05/22 0225 09/05/22 0904 09/06/22 0500  HGB 7.6*  --   --  7.3*  --  8.0*  HCT 24.6*  --   --  23.7*  --  26.0*  PLT 169  --   --  162  --  189  APTT  --  32  --   --   --   --   LABPROT  --  16.4*  --   --   --   --   INR  --  1.3*  --   --   --   --   HEPARINUNFRC  --  0.11*   < > 0.41 0.53 0.62  CREATININE 0.90  --   --  0.91  --  0.72   < > = values in this interval not displayed.     Estimated Creatinine Clearance: 113 mL/min (by C-G formula based on SCr of 0.72 mg/dL).  Mediations: Infusion:   sodium chloride Stopped (09/04/22 0558)   sodium chloride Stopped (09/04/22 0855)   DOBUTamine 2.5 mcg/kg/min (09/05/22 2338)   ertapenem Stopped (09/05/22 1808)   heparin 1,400 Units/hr (09/06/22 0157)   norepinephrine (LEVOPHED) Adult infusion 6 mcg/min (09/05/22 2338)     Assessment: 66 year old male presented with hematuria of unclear duration. Patient on Eliquis PTA for afib, as well as Plavix for CAD (cath in August 2023, no good targets for PCI - CARDs recommendation at that time for Eliquis + Plavix, stop aspirin). Per med history completed with SNF MAR, it appears patient may have been on triple therapy since last admission. Anticoagulation was held on admission for hematuria, which has since cleared. Pharmacy consulted to start heparin infusion to re-challenge anticoagulation prior to resuming Eliquis.  Today, 09/06/22 HL 0.62, remains therapeutic on 1400 units/hr Hgb up to 8.0, plts WNL Per RN this  morning: no obvious bleeding overnight   Goal of Therapy:  Heparin level 0.3-0.7 units/ml Monitor platelets by anticoagulation protocol: Yes   Plan:  Continue heparin IV infusion at 1400 units/hr Daily heparin level and CBC Monitor closely for any signs/symptoms of bleeding, especially worsening of hematuria   Thank you for allowing pharmacy to be a part of this patient's care.  Royetta Asal, PharmD, BCPS Clinical Pharmacist [place] Please utilize Amion for appropriate phone number to reach the unit pharmacist ([place]) 09/06/2022 7:35 AM  Addendum: Received Apixaban per Pharmacy Consult  Plan: Discontinue heparin and associated labs At time that heparin is stopped, begin Apixaban 5 mg po BID Pharmacy will sign off Apixaban consult but will follow pt peripherally along with you  Thank you for allowing pharmacy to be a part of this patient's care.  Royetta Asal, PharmD, BCPS Clinical Pharmacist  Please utilize Amion for appropriate phone number to reach the unit pharmacist ([place]) 09/06/2022 1:02 PM
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
## 2022-09-06 NOTE — TOC Progression Note (Signed)
Transition of Care [facility]) - Progression Note    Patient Details  Name: Kashif Pooler MRN: [id number] Date of Birth: [birth year]  Transition of [facility]) CM/SW Contact  Leeroy Cha, RN Phone Number: 09/06/2022, 10:01 AM  Clinical Narrative:    [id number]/ chart reviewed.  Following for toc needs.  Plan is to return home with self-care currently.     Barriers to Discharge: Continued Medical Work up  Expected Discharge Plan and Services                                                 Social Determinants of Health (SDOH) Interventions    Readmission Risk Interventions   Row Labels 09/02/2022   10:36 AM  Readmission Risk Prevention Plan   Section Header. No data exists in this row.   Transportation Screening   Complete  Medication Review Press photographer)   Complete  PCP or Specialist appointment within 3-5 days of discharge   Complete  HRI or Home Care Consult   Complete  SW Recovery Care/Counseling Consult   Complete  [place]   Complete

## 2022-09-06 NOTE — Progress Notes (Addendum)
NAME:  Peter Yang, MRN:  [id number], DOB:  [birth year], LOS: 5 ADMISSION DATE:  08/31/2022, CONSULTATION DATE:  11/11 REFERRING MD:  Lelon Mast FOR CONSULT: sepsis   History of Present Illness:  Peter Yang is an 66 y.o. M who presented to the [facility] ED with a chief complaint of hematuria.  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  Sent to the emergency department SNF staff on 11/11.  At the emergency department he developed hypotension with a BP of 83/63. WBC 14.2, lactate 5.0 > 7.9.  Code sepsis was called.  Blood cultures and urine cultures were obtained.  Patient was given fluid bolus per sepsis guidelines.  Patient with complicated infectious history-started on meropenem and amoxicillin by [facility].  Urology was consulted for hematuria and concern for forniers.  Recommends monitoring with possible cystoscopy once Eliquis washout.  [facility] admitted patient.  PCCM was consulted for assistance with sepsis with high lactate.  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 8.5L UOP!  Levo dose stable, remains on dobutamine 2.5, midodrine 10 TID  Objective   Blood pressure (!) 99/58, pulse 85, temperature 98.3 F (36.8 C), temperature  source Oral, resp. rate 15, height '6\' 4"'$  (1.93 m), weight 94.5 kg, SpO2 99 %. CVP:  [2 mmHg-8 mmHg] 2 mmHg      Intake/Output Summary (Last 24 hours) at 09/06/2022 1042 Last data filed at 09/06/2022 0900 Gross per 24 hour  Intake 1764.86 ml  Output 7400 ml  Net -5635.14 ml   Filed Weights   09/04/22 0530 09/05/22 0937 09/06/22 0500  Weight: 102 kg 101.9 kg 94.5 kg   Examination: General: chronically ill appearing HEENT: [place]/AT, eyes anicteric Neuro: follows commands CV: S1-S2, regular rate and rhythm PULM: diminished bl.  Normal wob GI: Soft, nontender Extremities: trace-1+ dependent edema Derm: Warm, dry, pallor   Coox 63 BUN/S Cr stable  No new imaging  No new micro data  Resolved Hospital Problem list   hypophosphatemia  Assessment & Plan:   Shock, initially suspected septic from UTI, now looking more likely cardiogenic shock. All cultures have remained negative this admission and he has been on antibiotics for weeks pre-dating this. Acute drop in EF, low coox. Acute on chronic HFrEF, ICM, CAD with stent in 05/2022 Lactic acidosis, secondary to above - follow coox, trial coming off of dobutamine today - levo for MAP 65, continue home midodrine - continue spironolactone 25 mg daily - Appreciate cardiology's management.  Unfortunately not a good candidate for revascularization.  Not tolerating most HFrEF medications now due to shock.   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  Plans for him to follow-up  with infectious disease team in [facility] as he previously has established care with them. - Not been moving around can have voiding trial per urology   Hematuria due to UTI Known prostate cancer, under surveillance with urology - continue heparin. Holding eliquis for now - continue holding Plavix-rechallenge today -  Eventually need cystoscopy per urology as outpatient - foley to remain in place till then - Continue Flomax and finasteride.   Hyperglycemia, uncontrolled -levemir increased to 20 units twice daily - Increasing mealtime insulin to 5 units 3 times daily AC - Sliding scale insulin - Goal blood glucose 140-180   Hyponatremia, hypervolemic Anasarca -Intermittent diuresis per cardiology   Non-anion gap metabolic acidosis, resolved - DC bicarb today  Deconditioning -Needs PT, OT> reaching out to determine why he is not an appropriate candidate for therapy this week. Orders replaced since they were cancelled. -Has prosthesis at bedside and can do OOB mobility  Chronic Stage 2 sacral ulcer -Continue wound care  Insomnia, phantom limb pain LLE -Continue increased nightly gabapentin dose -Continue melatonin QHS -trazodone scheduled at bedtime -Appreciate palliative care's management-added Ambien     Best Practice (right click and "Reselect all SmartList Selections" daily)  Diet/type: Regular consistency  DVT prophylaxis: SCD's, systemic heparin GI prophylaxis: PPI- home med Lines: PICC Foley:  yes and it is still needed Code Status:  DNR   Critical care time:      This patient is critically ill with multiple organ system failure which requires frequent high complexity decision making, assessment, support, evaluation, and titration of therapies. This was completed through the application of advanced monitoring technologies and extensive interpretation of multiple databases. During this encounter critical care time was devoted to patient care services described in this note for 36 minutes.  [name]  09/06/22 10:42 AM  Pulmonary & Critical Care
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
## 2022-09-06 NOTE — Progress Notes (Addendum)
Rounding Note    Patient Name: Peter Yang Date of Encounter: 09/06/2022  [place] Cardiologist:   Dr Blenda Bridegroom, [place], [place]   Subjective   Sleepy but feels a little better today  Inpatient Medications    Scheduled Meds:  atorvastatin  80 mg Oral Daily   Chlorhexidine Gluconate Cloth  6 each Topical Daily   finasteride  5 mg Oral Daily   gabapentin  600 mg Oral QHS   insulin aspart  0-15 Units Subcutaneous TID WC   insulin aspart  0-5 Units Subcutaneous QHS   insulin aspart  5 Units Subcutaneous TID WC   insulin detemir  20 Units Subcutaneous BID   melatonin  5 mg Oral QHS   midodrine  10 mg Oral TID with meals   pantoprazole  40 mg Oral Daily   sodium bicarbonate  650 mg Oral BID   sodium chloride flush  10-40 mL Intracatheter Q12H   spironolactone  25 mg Oral Daily   tamsulosin  0.4 mg Oral Daily   traZODone  50 mg Oral QHS   Continuous Infusions:  sodium chloride Stopped (09/04/22 0558)   sodium chloride Stopped (09/04/22 0855)   DOBUTamine 2.5 mcg/kg/min (09/05/22 2338)   ertapenem Stopped (09/05/22 1808)   heparin 1,400 Units/hr (09/06/22 0157)   norepinephrine (LEVOPHED) Adult infusion 6 mcg/min (09/05/22 2338)   PRN Meds: sodium chloride, acetaminophen **OR** acetaminophen, mouth rinse, sodium chloride flush, zolpidem   Vital Signs    CVP 9-11 overnight, now 10 Vitals:   09/06/22 0400 09/06/22 0500 09/06/22 0600 09/06/22 0700  BP: (!) 98/44 (!) 98/44 (!) 93/54 (!) 91/57  Pulse: 86 (!) 38 82 87  Resp: _0 Temp: 97.7 F (36.5 C)     TempSrc: Oral     SpO2: 100% 97% 98% 98%  Weight:  94.5 kg    Height:        Intake/Output Summary (Last 24 hours) at 09/06/2022 0740 Last data filed at 09/06/2022 0644 Gross per 24 hour  Intake 2083.58 ml  Output 7855 ml  Net -5771.42 ml      09/06/2022    5:00 AM 09/05/2022    9:37 AM 09/04/2022    5:30 AM  Last 3 Weights  Weight (lbs) 208 lb 5.4 oz 224 lb 10.4 oz 224 lb  13.9 oz  Weight (kg) 94.5 kg 101.9 kg 102 kg      Telemetry    Sinus rhythm, PVCs and triplets- Personally Reviewed  ECG    None today- Personally Reviewed  Physical Exam   GEN: Chronically ill-appearing male, no acute distress.   Neck: no JVD Cardiac: RRR, no murmurs, rubs, or gallops.  Respiratory: Rales bases bilaterally. GI: Soft, nontender, non-distended  MS: No edema; No new deformity. S/p L BKA Neuro:  Nonfocal  Psych: Normal affect   Labs       Latest Ref Rng & Units 09/03/2022   11:22 AM 09/03/2022    5:00 PM 09/04/2022    6:04 AM 09/05/2022    5:51 AM 09/06/2022    5:00 AM  Total Hgb  Total Hgb 12.0 - 16.0 g/dL 7.8  7.5  8.0  7.8  8.2       Latest Ref Rng & Units 09/03/2022   11:22 AM 09/03/2022    5:00 PM 09/04/2022    6:04 AM 09/05/2022    5:51 AM 09/06/2022    5:00 AM  O2 Sat  O2 Sat % 51.5  74.6  62  63.4  63.4       Latest Ref Rng & Units 09/03/2022   11:22 AM 09/03/2022    5:00 PM 09/04/2022    6:04 AM 09/05/2022    5:51 AM 09/06/2022    5:00 AM  Carboxy Hgb  Carboxy Hgb 0.5 - 1.5 % 1.4  2.3  1.6  1.2  1.5       Latest Ref Rng & Units 09/03/2022   11:22 AM 09/03/2022    5:00 PM 09/04/2022    6:04 AM 09/05/2022    5:51 AM 09/06/2022    5:00 AM  Met Hgb  Met Hgb 0.0 - 1.5 % 0.9  <0.7  1.4  <0.7  0.9       09/05/2022   12:00 PM 09/05/2022    4:00 PM 09/05/2022    8:00 PM 09/06/2022   12:00 AM 09/06/2022    4:00 AM  CVP  CVP (mmHg) 8 mmHg 8 mmHg 6 mmHg 2 mmHg 2 mmHg    High Sensitivity Troponin:   Recent Labs  Lab 09/01/22 0040 09/02/22 1935  TROPONINIHS 263* 771*     Chemistry Recent Labs  Lab 08/31/22 1511 09/01/22 0040 09/04/22 0604 09/05/22 0225 09/06/22 0500  NA 134*   < > 132* 129* 134*  K 5.2*   < > 3.8 3.5 4.1  CL 104   < > 106 101 102  CO2 18*   < > 20* 22 27  GLUCOSE 209*   < > 238* 346* 156*  BUN 45*   < > 27* 30* 28*  CREATININE 2.07*   < > 0.90 0.91 0.72  CALCIUM 9.1   < > 7.8* 7.5* 8.1*  MG  --     < > 2.0 1.7 2.1  PROT 7.9  --  5.4*  --   --   ALBUMIN 3.7  --  2.4*  --   --   AST 23  --  69*  --   --   ALT 15  --  124*  --   --   ALKPHOS 104  --  106  --   --   BILITOT 1.0  --  0.6  --   --   GFRNONAA 35*   < > >60 >60 >60  ANIONGAP 12   < > _0 < > = values in this interval not displayed.    Lipids No results for input(s): "CHOL", "TRIG", "HDL", "LABVLDL", "LDLCALC", "CHOLHDL" in the last 168 hours.  Hematology Recent Labs  Lab 09/04/22 0604 09/05/22 0225 09/06/22 0500  WBC 5.9 6.5 7.7  RBC 2.85* 2.78* 3.07*  HGB 7.6* 7.3* 8.0*  HCT 24.6* 23.7* 26.0*  MCV 86.3 85.3 84.7  MCH 26.7 26.3 26.1  MCHC 30.9 30.8 30.8  RDW 16.1* 16.1* 16.1*  PLT 169 162 189   Thyroid  Lab Results  Component Value Date   TSH 5.441 (H) 08/03/2022     BNP Recent Labs  Lab 09/03/22 0415 09/06/22 0500  BNP 3,094.3* 2,623.1*    DDimer  Recent Labs  Lab 09/02/22 1935  DDIMER 2.00*     Radiology    No results found.  Cardiac Studies   Echo 09/03/22  1. Left ventricular ejection fraction, by estimation, is 30 to 35%. The left ventricle has moderately decreased function. The left ventricle demonstrates regional wall motion abnormalities (see scoring diagram/findings for description). The left ventricular internal cavity size was mildly dilated. Left ventricular diastolic  parameters are consistent with Grade II diastolic dysfunction (pseudonormalization).   2. Right ventricular systolic function is moderately reduced. The right ventricular size is mildly enlarged. There is mildly elevated pulmonary artery systolic pressure. The estimated right ventricular systolic pressure is 50.3 mmHg.   3. Left atrial size was moderately dilated.   4. Right atrial size was mildly dilated.   5. The mitral valve is abnormal. Moderate mitral valve regurgitation. No evidence of mitral stenosis.   6. The aortic valve is tricuspid. There is mild calcification of the aortic valve. Aortic valve  regurgitation is not visualized. Aortic valve sclerosis is present, with no evidence of aortic valve stenosis.   7. Aortic dilatation noted. There is mild dilatation of the aortic root, measuring 42 mm.   8. The inferior vena cava is normal in size with <50% respiratory variability, suggesting right atrial pressure of 8 mmHg.   Comparison(s): Changes from prior study are noted. EF significantly reduced compared to prior, now with more prominent anterior wall motion abnormalities. Findings communicated to PCCM team.   Conclusion(s)/Recommendation(s): No left ventricular mural or apical  thrombus/thrombi.   Patient Profile     66 y.o. male with a hx of nonrevascularizable CAD, ascending TAA (not seen on CT 07/2022, 64m aortic root by echo 11/14), TIA, PAF found during 05/2022 admission. DM2 with polyneuropathy and diabetic ulcer, L BKA, HTN, HLD, chronic HFrEF, BPH, UTIs, bowel perforation, prsostate CA, OSA, prior retroperitoneal abscess who was admitted 11/11 with ongoing gross hematuria, shock req multiple pressors (midodrine at home), recent COVID, ESBL UTI, lactic acidosis, AKI, scrotal cellulitis, DNR.  Cards saw 11/15 for CHF exacerbation w/ EF 30-35%, grade 2 dd, Ao sclerosis (EF reduced from previous)  Assessment & Plan    Acute on chronic CHF, CGS is part of his shock profile - EF now 30%  - Coox 63.4 and CVP 2, greatly improved on higher dose Lasix, still on pressors - DBA 2.5 mcg/kg/min, norepi at 6 mcg/min, midodrine 10 mg tid (home dose), nursing staff currently not able to wean - UOP 8.5 L overnight on Lasix 60 mg IV q 8 hr, no significant change in BUN/CR, CVP normal, Lasix doses completed - admit wt 200.4 >> 224.87 on 11/15 >> 208 today, cont to follow - with CVP improvement, may be able to change to p.o. meds soon  2. Hx PAF and CAD w/ PCI RCA 04/23, multiple occluded vessels, not amenable to PCI - Heparin started 11/15, H&H was trending down but has stabilized - currently  off DAPT, MD advise on restarting ASA &/or Plavix - cont statin, no BB w/ low BP -No ischemic symptoms  3. Prostatitis, deconditioning, social and other issues, per Dr. MVerlee Monte For questions or updates, please contact [place]Please consult [email or web address] for contact info under        Signed, RRosaria Ferries PA-C  09/06/2022, 7:40 AM    Personally seen and examined. Agree with APP above with the following comments: Acute on chronic HF, PAD and CAD patient with prior PAF.  Was not able to tolerate triple therapy with bleeding. Has had resolution of his gross hematuria. Didn't sleep well still has stump pain. Negative 7 Kg. CVP's have improved. - will trial wean of dobutamine, this could decrease levophed dose. - if worsening Co-Ox we would need to re-discuss GOC - he is not a candidate for transplant/LVAD and does not have the support for palliative inotropes - he does not understand a lot of this. -  PC has seen. - difficult social situation  CRITICAL CARE Performed by: Latrise Bowland A Tykesha Konicki  Total critical care time: 35 minutes. Critical care time was exclusive of separately billable procedures and treating other patients. Critical care was necessary to treat or prevent imminent or life-threatening deterioration. Critical care was time spent personally by me on the following activities: development of treatment plan with patient and/or surrogate as well as nursing, discussions with consultants, evaluation of patient's response to treatment, examination of patient, obtaining history from patient or surrogate, ordering and performing treatments and interventions, ordering and review of laboratory studies, ordering and review of radiographic studies, pulse oximetry and re-evaluation of patient's condition.    Signed, Rudean Haskell, [place]  09/06/2022 12:56 PM

## 2022-09-07 ENCOUNTER — Other Ambulatory Visit: Payer: Self-pay

## 2022-09-07 DIAGNOSIS — R579 Shock, unspecified: Secondary | ICD-10-CM | POA: Diagnosis not present

## 2022-09-07 DIAGNOSIS — R310 Gross hematuria: Secondary | ICD-10-CM | POA: Diagnosis not present

## 2022-09-07 DIAGNOSIS — I255 Ischemic cardiomyopathy: Secondary | ICD-10-CM | POA: Diagnosis not present

## 2022-09-07 LAB — CBC
HCT: 26.4 % — ABNORMAL LOW (ref 39.0–52.0)
Hemoglobin: 8 g/dL — ABNORMAL LOW (ref 13.0–17.0)
MCH: 25.8 pg — ABNORMAL LOW (ref 26.0–34.0)
MCHC: 30.3 g/dL (ref 30.0–36.0)
MCV: 85.2 fL (ref 80.0–100.0)
Platelets: 223 10*3/uL (ref 150–400)
RBC: 3.1 MIL/uL — ABNORMAL LOW (ref 4.22–5.81)
RDW: 16.3 % — ABNORMAL HIGH (ref 11.5–15.5)
WBC: 9.1 10*3/uL (ref 4.0–10.5)
nRBC: 0 % (ref 0.0–0.2)

## 2022-09-07 LAB — GLUCOSE, CAPILLARY
Glucose-Capillary: 191 mg/dL — ABNORMAL HIGH (ref 70–99)
Glucose-Capillary: 206 mg/dL — ABNORMAL HIGH (ref 70–99)
Glucose-Capillary: 219 mg/dL — ABNORMAL HIGH (ref 70–99)
Glucose-Capillary: 130 mg/dL — ABNORMAL HIGH (ref 70–99)

## 2022-09-07 LAB — BASIC METABOLIC PANEL
Anion gap: 6 (ref 5–15)
BUN: 26 mg/dL — ABNORMAL HIGH (ref 8–23)
CO2: 27 mmol/L (ref 22–32)
Calcium: 8.1 mg/dL — ABNORMAL LOW (ref 8.9–10.3)
Chloride: 100 mmol/L (ref 98–111)
Creatinine, Ser: 0.75 mg/dL (ref 0.61–1.24)
GFR, Estimated: >60 mL/min (ref >60)
Glucose, Bld: 192 mg/dL — ABNORMAL HIGH (ref 70–99)
Potassium: 4.2 mmol/L (ref 3.5–5.1)
Sodium: 133 mmol/L — ABNORMAL LOW (ref 135–145)

## 2022-09-07 LAB — COOXEMETRY PANEL
Carboxyhemoglobin: 2.4 % — ABNORMAL HIGH (ref 0.5–1.5)
Methemoglobin: <0.7 % (ref 0.0–1.5)
O2 Saturation: 66.6 %
Total hemoglobin: 8.5 g/dL — ABNORMAL LOW (ref 12.0–16.0)

## 2022-09-07 LAB — MAGNESIUM: Magnesium: 1.7 mg/dL (ref 1.7–2.4)

## 2022-09-07 MED ORDER — MAGNESIUM SULFATE 2 GM/50ML IV SOLN
2.0000 g | Freq: Once | INTRAVENOUS | Status: AC
  Administered 2022-09-07: 2 g via INTRAVENOUS
  Filled 2022-09-07: qty 50

## 2022-09-07 MED ORDER — LIDOCAINE 5 % EX PTCH
1.0000 | MEDICATED_PATCH | CUTANEOUS | Status: DC
  Administered 2022-09-07 – 2022-09-08 (×2): 1 via TRANSDERMAL
  Filled 2022-09-07 (×2): qty 1

## 2022-09-07 NOTE — Progress Notes (Signed)
Rounding Note    Patient Name: Peter Yang Date of Encounter: 09/07/2022  [place] Cardiologist:   Dr Blenda Bridegroom, [place], [place] In [facility] system Dr Debara Pickett  Subjective   Lethargic this am   Inpatient Medications    Scheduled Meds:  apixaban  5 mg Oral BID   atorvastatin  80 mg Oral Daily   Chlorhexidine Gluconate Cloth  6 each Topical Daily   clopidogrel  75 mg Oral Daily   finasteride  5 mg Oral Daily   gabapentin  600 mg Oral QHS   insulin aspart  0-15 Units Subcutaneous TID WC   insulin aspart  0-5 Units Subcutaneous QHS   insulin aspart  5 Units Subcutaneous TID WC   insulin detemir  20 Units Subcutaneous BID   melatonin  5 mg Oral QHS   midodrine  10 mg Oral TID with meals   pantoprazole  40 mg Oral Daily   sodium chloride flush  10-40 mL Intracatheter Q12H   spironolactone  25 mg Oral Daily   tamsulosin  0.4 mg Oral Daily   traZODone  50 mg Oral QHS   Continuous Infusions:  sodium chloride Stopped (09/04/22 0558)   sodium chloride Stopped (09/04/22 0855)   ertapenem Stopped (09/06/22 1555)   norepinephrine (LEVOPHED) Adult infusion 8 mcg/min (09/07/22 0809)   PRN Meds: sodium chloride, acetaminophen **OR** acetaminophen, mouth rinse, sodium chloride flush, zolpidem   Vital Signs    CVP 9-11 overnight, now 10 Vitals:   09/07/22 0400 09/07/22 0500 09/07/22 0600 09/07/22 0700  BP: (!) 95/59 96/60 101/72 (!) 102/54  Pulse: 80 83 83 82  Resp: _0 Temp: 98.7 F (37.1 C)   98.2 F (36.8 C)  TempSrc: Oral   Oral  SpO2: 97% 97% 97% 96%  Weight:  94.6 kg    Height:        Intake/Output Summary (Last 24 hours) at 09/07/2022 8250 Last data filed at 09/07/2022 0809 Gross per 24 hour  Intake 1217.93 ml  Output 2350 ml  Net -1132.07 ml      09/07/2022    5:00 AM 09/06/2022    5:00 AM 09/05/2022    9:37 AM  Last 3 Weights  Weight (lbs) 208 lb 8.9 oz 208 lb 5.4 oz 224 lb 10.4 oz  Weight (kg) 94.6 kg 94.5 kg 101.9 kg       Telemetry    Sinus rhythm, PVCs and triplets- Personally Reviewed  ECG    None today- Personally Reviewed  Physical Exam   GEN: Chronically ill-appearing male, no acute distress.   Neck: no JVD Cardiac: RRR, no murmurs, rubs, or gallops.  Respiratory: Rales bases bilaterally. GI: Soft, nontender, non-distended  MS: No edema; No new deformity. S/p L BKA Neuro:  Nonfocal  Psych: Normal affect   Labs       Latest Ref Rng & Units 09/03/2022    5:00 PM 09/04/2022    6:04 AM 09/05/2022    5:51 AM 09/06/2022    5:00 AM 09/07/2022    4:58 AM  Total Hgb  Total Hgb 12.0 - 16.0 g/dL 7.5  8.0  7.8  8.2  8.5       Latest Ref Rng & Units 09/03/2022    5:00 PM 09/04/2022    6:04 AM 09/05/2022    5:51 AM 09/06/2022    5:00 AM 09/07/2022    4:58 AM  O2 Sat  O2 Sat % 74.6  62  63.4  63.4  66.6       Latest Ref Rng & Units 09/03/2022    5:00 PM 09/04/2022    6:04 AM 09/05/2022    5:51 AM 09/06/2022    5:00 AM 09/07/2022    4:58 AM  Carboxy Hgb  Carboxy Hgb 0.5 - 1.5 % 2.3  1.6  1.2  1.5  2.4       Latest Ref Rng & Units 09/03/2022    5:00 PM 09/04/2022    6:04 AM 09/05/2022    5:51 AM 09/06/2022    5:00 AM 09/07/2022    4:58 AM  Met Hgb  Met Hgb 0.0 - 1.5 % <0.7  1.4  <0.7  0.9  <0.7       09/06/2022   12:00 PM 09/06/2022    4:00 PM 09/06/2022    8:00 PM 09/07/2022   12:00 AM 09/07/2022    4:00 AM  CVP  CVP (mmHg) 12 mmHg 7 mmHg 4 mmHg 8 mmHg 6 mmHg    High Sensitivity Troponin:   Recent Labs  Lab 09/01/22 0040 09/02/22 1935  TROPONINIHS 263* 771*     Chemistry Recent Labs  Lab 08/31/22 1511 09/01/22 0040 09/04/22 0604 09/05/22 0225 09/06/22 0500 09/07/22 0458  NA 134*   < > 132* 129* 134* 133*  K 5.2*   < > 3.8 3.5 4.1 4.2  CL 104   < > 106 101 102 100  CO2 18*   < > 20* _0 GLUCOSE 209*   < > 238* 346* 156* 192*  BUN 45*   < > 27* 30* 28* 26*  CREATININE 2.07*   < > 0.90 0.91 0.72 0.75  CALCIUM 9.1   < > 7.8* 7.5* 8.1* 8.1*  MG   --    < > 2.0 1.7 2.1 1.7  PROT 7.9  --  5.4*  --   --   --   ALBUMIN 3.7  --  2.4*  --   --   --   AST 23  --  69*  --   --   --   ALT 15  --  124*  --   --   --   ALKPHOS 104  --  106  --   --   --   BILITOT 1.0  --  0.6  --   --   --   GFRNONAA 35*   < > >60 >60 >60 >60  ANIONGAP 12   < > _1 < > = values in this interval not displayed.    Lipids No results for input(s): "CHOL", "TRIG", "HDL", "LABVLDL", "LDLCALC", "CHOLHDL" in the last 168 hours.  Hematology Recent Labs  Lab 09/05/22 0225 09/06/22 0500 09/07/22 0458  WBC 6.5 7.7 9.1  RBC 2.78* 3.07* 3.10*  HGB 7.3* 8.0* 8.0*  HCT 23.7* 26.0* 26.4*  MCV 85.3 84.7 85.2  MCH 26.3 26.1 25.8*  MCHC 30.8 30.8 30.3  RDW 16.1* 16.1* 16.3*  PLT 162 189 223   Thyroid  Lab Results  Component Value Date   TSH 5.441 (H) 08/03/2022     BNP Recent Labs  Lab 09/03/22 0415 09/06/22 0500  BNP 3,094.3* 2,623.1*    DDimer  Recent Labs  Lab 09/02/22 1935  DDIMER 2.00*     Radiology    No results found.  Cardiac Studies   Echo 09/03/22  1. Left ventricular ejection fraction, by estimation, is 30 to 35%. The left ventricle  has moderately decreased function. The left ventricle demonstrates regional wall motion abnormalities (see scoring diagram/findings for description). The left ventricular internal cavity size was mildly dilated. Left ventricular diastolic parameters are consistent with Grade II diastolic dysfunction (pseudonormalization).   2. Right ventricular systolic function is moderately reduced. The right ventricular size is mildly enlarged. There is mildly elevated pulmonary artery systolic pressure. The estimated right ventricular systolic pressure is 10.2 mmHg.   3. Left atrial size was moderately dilated.   4. Right atrial size was mildly dilated.   5. The mitral valve is abnormal. Moderate mitral valve regurgitation. No evidence of mitral stenosis.   6. The aortic valve is tricuspid. There is mild  calcification of the aortic valve. Aortic valve regurgitation is not visualized. Aortic valve sclerosis is present, with no evidence of aortic valve stenosis.   7. Aortic dilatation noted. There is mild dilatation of the aortic root, measuring 42 mm.   8. The inferior vena cava is normal in size with <50% respiratory variability, suggesting right atrial pressure of 8 mmHg.   Comparison(s): Changes from prior study are noted. EF significantly reduced compared to prior, now with more prominent anterior wall motion abnormalities. Findings communicated to PCCM team.   Conclusion(s)/Recommendation(s): No left ventricular mural or apical  thrombus/thrombi.   Patient Profile     66 y.o. male with a hx of nonrevascularizable CAD, ascending TAA (not seen on CT 07/2022, 8m aortic root by echo 11/14), TIA, PAF found during 05/2022 admission. DM2 with polyneuropathy and diabetic ulcer, L BKA, HTN, HLD, chronic HFrEF, BPH, UTIs, bowel perforation, prsostate CA, OSA, prior retroperitoneal abscess who was admitted 11/11 with ongoing gross hematuria, shock req multiple pressors (midodrine at home), recent COVID, ESBL UTI, lactic acidosis, AKI, scrotal cellulitis, DNR.  Cards saw 11/15 for CHF exacerbation w/ EF 30-35%, grade 2 dd, Ao sclerosis (EF reduced from previous)  Assessment & Plan    Acute on chronic CHF, CGS is part of his shock profile - EF now 30%  - Coox 66.7% this am and CVP 2, greatly improved on higher dose Lasix, still on pressors -  On levophed and midodrine  -  1100 cc diuresis yesterday   2. Hx PAF and CAD w/ PCI RCA 04/23, multiple occluded vessels, not amenable to PCI -  back on eliquis  -No ischemic symptoms - back on plavix   3. Prostatitis, deconditioning, social and other issues, per Dr. MVerlee Monte For questions or updates, please contact [place] CreekPlease consult [email or web address] for contact info under     Signed, PJenkins Rouge MD  09/07/2022, 8:22 AM

## 2022-09-07 NOTE — Plan of Care (Signed)
Discussed with patient pain management, bedtime medications and foley care with some teach back displayed.  Problem: Education: Goal: Ability to describe self-care measures that may prevent or decrease complications (Diabetes Survival Skills Education) will improve Outcome: Progressing

## 2022-09-07 NOTE — Progress Notes (Addendum)
NAME:  Peter Yang, MRN:  [id number], DOB:  [birth year], LOS: 6 ADMISSION DATE:  08/31/2022, CONSULTATION DATE:  11/11 REFERRING MD:  Lelon Mast FOR CONSULT: sepsis   History of Present Illness:  Peter Yang is an 66 y.o. M who presented to the [facility] ED with a chief complaint of hematuria.  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  Sent to the emergency department SNF staff on 11/11.  At the emergency department he developed hypotension with a BP of 83/63. WBC 14.2, lactate 5.0 > 7.9.  Code sepsis was called.  Blood cultures and urine cultures were obtained.  Patient was given fluid bolus per sepsis guidelines.  Patient with complicated infectious history-started on meropenem and amoxicillin by [facility].  Urology was consulted for hematuria and concern for forniers.  Recommends monitoring with possible cystoscopy once Eliquis washout.  [facility] admitted patient.  PCCM was consulted for assistance with sepsis with high lactate.  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 Hb stable and urine draining in foley clear after restarting plavix, transitioning to eliquis.  Objective   Blood pressure  (!) 102/54, pulse 82, temperature 98.2 F (36.8 C), temperature source Oral, resp. rate 13, height '6\' 4"'$  (1.93 m), weight 94.6 kg, SpO2 96 %. CVP:  [4 mmHg-12 mmHg] 6 mmHg      Intake/Output Summary (Last 24 hours) at 09/07/2022 0838 Last data filed at 09/07/2022 0809 Gross per 24 hour  Intake 1217.93 ml  Output 2350 ml  Net -1132.07 ml   Filed Weights   09/05/22 0937 09/06/22 0500 09/07/22 0500  Weight: 101.9 kg 94.5 kg 94.6 kg   Examination: General: chronically ill appearing HEENT: [place]/AT, eyes anicteric, tracks Neuro: follows commands, attends to interview CV: S1-S2, regular rate and rhythm PULM: diminished bl.  Normal wob GI: Soft, nontender Extremities: trace-1+ dependent edema Derm: Warm, dry, pallor   Coox 66 BUN/S Cr stable  No new imaging  No new micro data  Resolved Hospital Problem list   Hypophosphatemia NAGMA  Assessment & Plan:   Shock, initially suspected septic from UTI, now looking more likely cardiogenic shock. All cultures have remained negative this admission and he has been on antibiotics for weeks pre-dating this. Acute drop in EF, low coox. Acute on chronic HFrEF, ICM, CAD with stent in 05/2022 Lactic acidosis, secondary to above - recheck cortisol, trial stress dose steroid if trouble weaning levo later this morning - coox stable off dobutamine though remains on pressor - levo for MAP 65, continue home midodrine - continue spironolactone 25 mg daily - Appreciate cardiology's management.  Unfortunately not a good candidate for revascularization.  Not tolerating most HFrEF medications now due to shock. 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  Plans for him to follow-up with infectious disease team in  [facility] as he previously has established care with them. -as below foley remains in place till urology follow up as outpatient   Hematuria due to UTI Known prostate cancer, under surveillance with urology - continue eliquis, plavix - Eventually need cystoscopy per urology as outpatient - foley to remain in place till then - Continue Flomax and finasteride.   DM2 - levemir 20 units twice daily - mealtime insulin 5 units 3 times daily AC - Sliding scale insulin   Hyponatremia, hypervolemic Anasarca -Intermittent diuresis per cardiology  Deconditioning -f/u PT/OT recs -Has prosthesis at bedside and can do OOB mobility  Chronic Stage 2 sacral ulcer -Continue wound care  Insomnia, phantom limb pain LLE -Continue increased nightly gabapentin dose -Continue melatonin QHS -trazodone scheduled at bedtime -Appreciate palliative care's management-added Ambien     Best Practice (right click and "Reselect all SmartList Selections" daily)  Diet/type: Regular consistency  DVT prophylaxis: eliquis GI prophylaxis: PPI- home med Lines: PICC Foley:  yes and it is still needed Code Status:  DNR   Critical care time:      This patient is critically ill with shock which requires frequent high complexity decision making, assessment, support, evaluation, and titration of therapies. This was completed through the application of advanced monitoring technologies and extensive interpretation of multiple databases. During this encounter critical care time was devoted to patient care services described in this note for 36 minutes.  [name]  09/07/22 8:38 AM [facility]

## 2022-09-07 NOTE — Progress Notes (Signed)
[facility] ADULT ICU REPLACEMENT PROTOCOL   The patient does apply for the [facility] Adult ICU Electrolyte Replacment Protocol based on the criteria listed below:   1.Exclusion criteria: TCTS, ECMO, Dialysis, and Myasthenia Gravis patients 2. Is GFR >/= 30 ml/min? Yes.    Patient's GFR today is >60 3. Is SCr </= 2? Yes.   Patient's SCr is 0.75 mg/dL 4. Did SCr increase >/= 0.5 in 24 hours? No. 5.Pt's weight >40kg  Yes.   6. Abnormal electrolyte(s):   Mg 1.7  7. Electrolytes replaced per protocol 8.  Call MD STAT for K+ </= 2.5, Phos </= 1, or Mag </= 1 Physician:  V. Harrie Jeans R Jerimey Burridge 09/07/2022 5:37 AM

## 2022-09-08 DIAGNOSIS — Z515 Encounter for palliative care: Secondary | ICD-10-CM | POA: Diagnosis not present

## 2022-09-08 DIAGNOSIS — R579 Shock, unspecified: Secondary | ICD-10-CM | POA: Diagnosis not present

## 2022-09-08 DIAGNOSIS — R310 Gross hematuria: Secondary | ICD-10-CM | POA: Diagnosis not present

## 2022-09-08 DIAGNOSIS — I5043 Acute on chronic combined systolic (congestive) and diastolic (congestive) heart failure: Secondary | ICD-10-CM

## 2022-09-08 DIAGNOSIS — Z7189 Other specified counseling: Secondary | ICD-10-CM | POA: Diagnosis not present

## 2022-09-08 DIAGNOSIS — I480 Paroxysmal atrial fibrillation: Secondary | ICD-10-CM | POA: Diagnosis not present

## 2022-09-08 LAB — GLUCOSE, CAPILLARY
Glucose-Capillary: 157 mg/dL — ABNORMAL HIGH (ref 70–99)
Glucose-Capillary: 188 mg/dL — ABNORMAL HIGH (ref 70–99)
Glucose-Capillary: 217 mg/dL — ABNORMAL HIGH (ref 70–99)
Glucose-Capillary: 187 mg/dL — ABNORMAL HIGH (ref 70–99)

## 2022-09-08 LAB — COOXEMETRY PANEL
Carboxyhemoglobin: 2.7 % — ABNORMAL HIGH (ref 0.5–1.5)
Methemoglobin: <0.7 % (ref 0.0–1.5)
O2 Saturation: 93 %
Total hemoglobin: 8.1 g/dL — ABNORMAL LOW (ref 12.0–16.0)

## 2022-09-08 LAB — BASIC METABOLIC PANEL
Anion gap: 4 — ABNORMAL LOW (ref 5–15)
BUN: 28 mg/dL — ABNORMAL HIGH (ref 8–23)
CO2: 26 mmol/L (ref 22–32)
Calcium: 8.1 mg/dL — ABNORMAL LOW (ref 8.9–10.3)
Chloride: 101 mmol/L (ref 98–111)
Creatinine, Ser: 0.78 mg/dL (ref 0.61–1.24)
GFR, Estimated: >60 mL/min (ref >60)
Glucose, Bld: 173 mg/dL — ABNORMAL HIGH (ref 70–99)
Potassium: 4.2 mmol/L (ref 3.5–5.1)
Sodium: 131 mmol/L — ABNORMAL LOW (ref 135–145)

## 2022-09-08 LAB — CBC
HCT: 25.7 % — ABNORMAL LOW (ref 39.0–52.0)
Hemoglobin: 7.9 g/dL — ABNORMAL LOW (ref 13.0–17.0)
MCH: 26 pg (ref 26.0–34.0)
MCHC: 30.7 g/dL (ref 30.0–36.0)
MCV: 84.5 fL (ref 80.0–100.0)
Platelets: 196 10*3/uL (ref 150–400)
RBC: 3.04 MIL/uL — ABNORMAL LOW (ref 4.22–5.81)
RDW: 16.1 % — ABNORMAL HIGH (ref 11.5–15.5)
WBC: 7.7 10*3/uL (ref 4.0–10.5)
nRBC: 0 % (ref 0.0–0.2)

## 2022-09-08 LAB — MAGNESIUM: Magnesium: 2 mg/dL (ref 1.7–2.4)

## 2022-09-08 LAB — CORTISOL: Cortisol, Plasma: 3.7 ug/dL

## 2022-09-08 MED ORDER — LIDOCAINE 5 % EX PTCH
1.0000 | MEDICATED_PATCH | Freq: Every day | CUTANEOUS | Status: DC
  Administered 2022-09-09 – 2022-10-18 (×13): 1 via TRANSDERMAL
  Filled 2022-09-08 (×45): qty 1

## 2022-09-08 MED ORDER — POLYVINYL ALCOHOL 1.4 % OP SOLN
1.0000 [drp] | OPHTHALMIC | Status: DC | PRN
  Administered 2022-09-08 – 2022-09-10 (×2): 1 [drp] via OPHTHALMIC
  Filled 2022-09-08: qty 15

## 2022-09-08 NOTE — Progress Notes (Signed)
Rounding Note    Patient Name: Peter Yang Date of Encounter: 09/08/2022  [place] Cardiologist:   Dr Blenda Bridegroom, [place], [place] [facility] Dr Debara Pickett  Subjective   Lethargic this am   Inpatient Medications    Scheduled Meds:  apixaban  5 mg Oral BID   atorvastatin  80 mg Oral Daily   Chlorhexidine Gluconate Cloth  6 each Topical Daily   clopidogrel  75 mg Oral Daily   finasteride  5 mg Oral Daily   gabapentin  600 mg Oral QHS   insulin aspart  0-15 Units Subcutaneous TID WC   insulin aspart  0-5 Units Subcutaneous QHS   insulin aspart  5 Units Subcutaneous TID WC   insulin detemir  20 Units Subcutaneous BID   lidocaine  1 patch Transdermal Q24H   melatonin  5 mg Oral QHS   midodrine  10 mg Oral TID with meals   pantoprazole  40 mg Oral Daily   sodium chloride flush  10-40 mL Intracatheter Q12H   tamsulosin  0.4 mg Oral Daily   traZODone  50 mg Oral QHS   Continuous Infusions:  sodium chloride Stopped (09/04/22 0558)   sodium chloride Stopped (09/04/22 0855)   ertapenem Stopped (09/07/22 1709)   norepinephrine (LEVOPHED) Adult infusion 7 mcg/min (09/08/22 0900)   PRN Meds: sodium chloride, acetaminophen **OR** acetaminophen, mouth rinse, sodium chloride flush, zolpidem   Vital Signs    CVP 9-11 overnight, now 10 Vitals:   09/08/22 0915 09/08/22 0930 09/08/22 0945 09/08/22 1000  BP: (!) 86/47 (!) 82/42 (!) 89/52 (!) 87/65  Pulse: 82 84 83 86  Resp: _0 Temp:      TempSrc:      SpO2: 98% 99% 99% 100%  Weight:      Height:        Intake/Output Summary (Last 24 hours) at 09/08/2022 1030 Last data filed at 09/08/2022 0900 Gross per 24 hour  Intake 645.55 ml  Output 1864 ml  Net -1218.45 ml      09/08/2022    5:00 AM 09/07/2022    5:00 AM 09/06/2022    5:00 AM  Last 3 Weights  Weight (lbs) 208 lb 8.9 oz 208 lb 8.9 oz 208 lb 5.4 oz  Weight (kg) 94.6 kg 94.6 kg 94.5 kg      Telemetry    Sinus rhythm, PVCs and  triplets- Personally Reviewed  ECG    None today- Personally Reviewed  Physical Exam   GEN: Chronically ill-appearing male, no acute distress.   Neck: no JVD Cardiac: RRR, no murmurs, rubs, or gallops.  Respiratory: Rales bases bilaterally. GI: Soft, nontender, non-distended  MS: No edema; No new deformity. S/p L BKA Neuro:  Nonfocal  Psych: Normal affect   Labs       Latest Ref Rng & Units 09/04/2022    6:04 AM 09/05/2022    5:51 AM 09/06/2022    5:00 AM 09/07/2022    4:58 AM 09/08/2022    4:01 AM  Total Hgb  Total Hgb 12.0 - 16.0 g/dL 8.0  7.8  8.2  8.5  8.1       Latest Ref Rng & Units 09/04/2022    6:04 AM 09/05/2022    5:51 AM 09/06/2022    5:00 AM 09/07/2022    4:58 AM 09/08/2022    4:01 AM  O2 Sat  O2 Sat % 62  63.4  63.4  66.6  93  Latest Ref Rng & Units 09/04/2022    6:04 AM 09/05/2022    5:51 AM 09/06/2022    5:00 AM 09/07/2022    4:58 AM 09/08/2022    4:01 AM  Carboxy Hgb  Carboxy Hgb 0.5 - 1.5 % 1.6  1.2  1.5  2.4  2.7       Latest Ref Rng & Units 09/04/2022    6:04 AM 09/05/2022    5:51 AM 09/06/2022    5:00 AM 09/07/2022    4:58 AM 09/08/2022    4:01 AM  Met Hgb  Met Hgb 0.0 - 1.5 % 1.4  <0.7  0.9  <0.7  <0.7       09/07/2022    4:00 PM 09/07/2022   10:00 PM 09/08/2022    4:00 AM 09/08/2022    8:00 AM 09/08/2022    8:45 AM  CVP  CVP (mmHg) 8 mmHg 14 mmHg 14 mmHg 5 mmHg 0 mmHg    High Sensitivity Troponin:   Recent Labs  Lab 09/01/22 0040 09/02/22 1935  TROPONINIHS 263* 771*     Chemistry Recent Labs  Lab 09/04/22 0604 09/05/22 0225 09/06/22 0500 09/07/22 0458 09/08/22 0401  NA 132*   < > 134* 133* 131*  K 3.8   < > 4.1 4.2 4.2  CL 106   < > 102 100 101  CO2 20*   < > _0 GLUCOSE 238*   < > 156* 192* 173*  BUN 27*   < > 28* 26* 28*  CREATININE 0.90   < > 0.72 0.75 0.78  CALCIUM 7.8*   < > 8.1* 8.1* 8.1*  MG 2.0   < > 2.1 1.7 2.0  PROT 5.4*  --   --   --   --   ALBUMIN 2.4*  --   --   --   --   AST  69*  --   --   --   --   ALT 124*  --   --   --   --   ALKPHOS 106  --   --   --   --   BILITOT 0.6  --   --   --   --   GFRNONAA >60   < > >60 >60 >60  ANIONGAP 6   < > 5 6 4*   < > = values in this interval not displayed.    Lipids No results for input(s): "CHOL", "TRIG", "HDL", "LABVLDL", "LDLCALC", "CHOLHDL" in the last 168 hours.  Hematology Recent Labs  Lab 09/06/22 0500 09/07/22 0458 09/08/22 0401  WBC 7.7 9.1 7.7  RBC 3.07* 3.10* 3.04*  HGB 8.0* 8.0* 7.9*  HCT 26.0* 26.4* 25.7*  MCV 84.7 85.2 84.5  MCH 26.1 25.8* 26.0  MCHC 30.8 30.3 30.7  RDW 16.1* 16.3* 16.1*  PLT 189 223 196   Thyroid  Lab Results  Component Value Date   TSH 5.441 (H) 08/03/2022     BNP Recent Labs  Lab 09/03/22 0415 09/06/22 0500  BNP 3,094.3* 2,623.1*    DDimer  Recent Labs  Lab 09/02/22 1935  DDIMER 2.00*     Radiology    No results found.  Cardiac Studies   Echo 09/03/22  1. Left ventricular ejection fraction, by estimation, is 30 to 35%. The left ventricle has moderately decreased function. The left ventricle demonstrates regional wall motion abnormalities (see scoring diagram/findings for description). The left ventricular internal cavity size was mildly dilated. Left ventricular diastolic  parameters are consistent with Grade II diastolic dysfunction (pseudonormalization).   2. Right ventricular systolic function is moderately reduced. The right ventricular size is mildly enlarged. There is mildly elevated pulmonary artery systolic pressure. The estimated right ventricular systolic pressure is 90.1 mmHg.   3. Left atrial size was moderately dilated.   4. Right atrial size was mildly dilated.   5. The mitral valve is abnormal. Moderate mitral valve regurgitation. No evidence of mitral stenosis.   6. The aortic valve is tricuspid. There is mild calcification of the aortic valve. Aortic valve regurgitation is not visualized. Aortic valve sclerosis is present, with no evidence of  aortic valve stenosis.   7. Aortic dilatation noted. There is mild dilatation of the aortic root, measuring 42 mm.   8. The inferior vena cava is normal in size with <50% respiratory variability, suggesting right atrial pressure of 8 mmHg.   Comparison(s): Changes from prior study are noted. EF significantly reduced compared to prior, now with more prominent anterior wall motion abnormalities. Findings communicated to PCCM team.   Conclusion(s)/Recommendation(s): No left ventricular mural or apical  thrombus/thrombi.   Patient Profile     66 y.o. male with a hx of nonrevascularizable CAD, ascending TAA (not seen on CT 07/2022, 86m aortic root by echo 11/14), TIA, PAF found during 05/2022 admission. DM2 with polyneuropathy and diabetic ulcer, L BKA, HTN, HLD, chronic HFrEF, BPH, UTIs, bowel perforation, prsostate CA, OSA, prior retroperitoneal abscess who was admitted 11/11 with ongoing gross hematuria, shock req multiple pressors (midodrine at home), recent COVID, ESBL UTI, lactic acidosis, AKI, scrotal cellulitis, DNR.  Cards saw 11/15 for CHF exacerbation w/ EF 30-35%, grade 2 dd, Ao sclerosis (EF reduced from previous)  Assessment & Plan    Acute on chronic CHF, CGS is part of his shock profile - EF now 30%  - Coox 66.7% yesterday Spurious arterial phase 93% today - CVP low  -  On levophed and midodrine  -  1200 cc diuresis yesterday   2. Hx PAF and CAD w/ PCI RCA 04/23, multiple occluded vessels, not amenable to PCI -  back on eliquis  -No ischemic symptoms - back on plavix   3. Prostatitis, deconditioning, social and other issues, per Dr. MVerlee Monte Note patient is DNR   For questions or updates, please contact CVelardePlease consult [email or web address] for contact info under     Signed, PJenkins Rouge MD  09/08/2022, 10:30 AM

## 2022-09-08 NOTE — Progress Notes (Signed)
1910 patient sleeping in bed responds to name on levo  2000 patient request all night medication now so he can get to bed earlier states he has not been sleeping well alert x4 able to make needs known turns self in bed education on keeping off back for sacrum to heal, foam in place.  08/2022 0530 lab called with abnormally high glucose redrawing labs check glucose 245 labs resent 0600 CHG complete all linen changed foam to right heel and sacrum changed  0650 labs resulted and glucose 356 patient is on sliding scale updated E-Link

## 2022-09-08 NOTE — Progress Notes (Signed)
  Transition of Care [facility]) Screening Note   Patient Details  Name: Naser Schuld Date of Birth: August 09, 1956   Transition of Care [facility]) CM/SW Contact:    Kimber Relic, LCSW Phone Number: 09/08/2022, 10:21 AM    Transition of Care Department [facility]) has reviewed patient and no TOC needs have been identified at this time. We will continue to monitor patient advancement through interdisciplinary progression rounds. If new patient transition needs arise, please place a TOC consult.

## 2022-09-08 NOTE — Progress Notes (Signed)
NAME:  Peter Yang, MRN:  [id number], DOB:  10-Feb-1956, LOS: 7 ADMISSION DATE:  08/31/2022, CONSULTATION DATE:  11/11 REFERRING MD:  Lelon Mast FOR CONSULT: sepsis   History of Present Illness:  Peter Yang is an 66 y.o. M who presented to the [facility] ED with a chief complaint of hematuria.  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  Sent to the emergency department SNF staff on 11/11.  At the emergency department he developed hypotension with a BP of 83/63. WBC 14.2, lactate 5.0 > 7.9.  Code sepsis was called.  Blood cultures and urine cultures were obtained.  Patient was given fluid bolus per sepsis guidelines.  Patient with complicated infectious history-started on meropenem and amoxicillin by TRH.  Urology was consulted for hematuria and concern for forniers.  Recommends monitoring with possible cystoscopy once Eliquis washout.  [facility] admitted patient.  PCCM was consulted for assistance with sepsis with high lactate.  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% BPH (hx prostate CA) SNF resident  Significant Hospital Events: Including procedures, antibiotic start and stop dates in addition to other pertinent events   11/11 Admit by TRH, BC>, UC>, PCCM consult, Mero and amoxicillin> 11/12 Started on peripheral Levophed for persistent hypotension, albumin 11/13 Troponin rising from 263 to 771, DVT/PE ruled out 11/14 dobutamine started for low coox, lower EF on echo 11/15 trial of restarting heparin  Interim History / Subjective:  Net negative 1.7L on his own yesterday  Pressor requirement stable  Coox in 90s  Objective   Blood pressure (!) 89/53, pulse 78, temperature (!) 97.5 F (36.4 C), temperature  source Oral, resp. rate 11, height '6\' 4"'$  (1.93 m), weight 94.6 kg, SpO2 97 %. CVP:  [7 mmHg-14 mmHg] 14 mmHg      Intake/Output Summary (Last 24 hours) at 09/08/2022 0825 Last data filed at 09/08/2022 0400 Gross per 24 hour  Intake 564.06 ml  Output 2281 ml  Net -1716.94 ml   Filed Weights   09/06/22 0500 09/07/22 0500 09/08/22 0500  Weight: 94.5 kg 94.6 kg 94.6 kg   Examination: General: chronically ill appearing HEENT:  sclera anicteric, tracks Neuro: follows commands, attends to interview CV: RRR PULM: diminished bl.  Normal wob GI: Soft, nontender Extremities: trace dependent edema Derm: Warm, dry, pallor   Coox 90s BUN S Cr stable  No new imaging  No new micro  Resolved Hospital Problem list   Hypophosphatemia NAGMA  Assessment & Plan:   Shock, initially suspected septic from UTI, now looking more likely cardiogenic shock. All cultures have remained negative this admission and he has been on antibiotics for weeks pre-dating this. Acute drop in EF, low coox. Acute on chronic HFrEF, ICM, CAD with stent in 05/2022 Lactic acidosis, secondary to above - f/u AM cortisol - coox stable off dobutamine though remains on pressor - wean levo for MAP 65, continue home midodrine - stop spironolactone - initially tolerated this in addition to dobutamine + levo but he may be intolerant of all GDMT  - Appreciate cardiology's management.  Unfortunately not a good candidate for revascularization.  Not tolerating most HFrEF medications now due to shock. Sounds like he's also not a good candidate for home inotrope and I'm  unsure if this could be managed at a SNF   UTI-- ESBL E Coli and CRE Proteus in the past (diagnosed at [facility]) Prostatitis Scrotal cellulitis -continue ertapenem per ID, OPAT orders placed -Appreciate ID's management.  Plans for him to follow-up with infectious disease team in [facility] as he previously has established care with them. -as below foley remains in  place till urology follow up as outpatient   Hematuria due to UTI Known prostate cancer, under surveillance with urology - continue eliquis, plavix - Eventually need cystoscopy per urology as outpatient - foley to remain in place till then - Continue Flomax and finasteride.   DM2 - levemir 20 units twice daily - mealtime insulin 5 units 3 times daily AC - Sliding scale insulin   Hyponatremia, hypervolemic Anasarca -Intermittent diuresis per cardiology  pAF -eliquis  Deconditioning -f/u PT/OT recs -Has prosthesis at bedside and can do OOB mobility  Chronic Stage 2 sacral ulcer -Continue wound care  Insomnia, phantom limb pain LLE -Continue increased nightly gabapentin dose -Continue melatonin QHS -trazodone scheduled at bedtime -Appreciate palliative care's management-added Ambien     Best Practice (right click and "Reselect all SmartList Selections" daily)  Diet/type: Regular consistency  DVT prophylaxis: eliquis GI prophylaxis: PPI- home med Lines: PICC Foley:  yes and it is still needed Code Status:  DNR   Critical care time:      This patient is critically ill with shock which requires frequent high complexity decision making, assessment, support, evaluation, and titration of therapies. This was completed through the application of advanced monitoring technologies and extensive interpretation of multiple databases. During this encounter critical care time was devoted to patient care services described in this note for 33 minutes.  [name]  09/08/22 8:25 AM [facility]

## 2022-09-08 NOTE — Progress Notes (Signed)
Daily Progress Note   Patient Name: Peter Yang       Date: 09/08/2022 DOB: August 07, 1956  Age: 66 y.o. MRN#: [id number] Attending Physician: [name], MD Primary Care Physician: Clovia Cuff, MD Admit Date: 08/31/2022  Reason for Consultation/Follow-up: Establishing goals of care  Patient Profile/HPI: 66 year old man, from [place], admitted to the hospital 08/31/2022 because of hematuria. He has a past medical history of CAD/NSTEMI, AAA, TIA, PAF on Eliquis, diabetes, hypertension, heart failure with reduced ejection fraction left ventricular ejection fraction 40-45%, history of recent COVID. Patient admitted to hospital medicine service because of a complicated UTI and hematuria, cultures positive for ESBL, antibiotics adjusted, urology also consulted for scrotal erythema and cellulitis, remains on antibiotics, hospital course complicated by ongoing hypotension requiring peripheral Levophed and Dobutamine support. Patient also on intravenous heparin infusion. PMT consulted for goals of care discussions.  Subjective: Discussed with bedside RN colleague, patient noted resting in bed does not appear to be in distress, remains on Levophed off Dobutamine.  medication history reviewed Chart reviewed     Review of Systems  Constitutional:  Positive for malaise/fatigue.  Musculoskeletal:  Positive for joint pain.          Psychiatric/Behavioral:  The patient has insomnia.     Physical Exam Constitutional:      Appearance: He is ill-appearing.  Pulmonary:     Effort: Pulmonary effort is normal.  Musculoskeletal:     Comments: Left BKA.  Skin:    Coloration: Skin is pale.            Vital Signs: BP (!) 79/50   Pulse 87   Temp 97.9 F (36.6 C) (Oral)    Resp 12   Ht '6\' 4"'$  (1.93 m)   Wt 94.6 kg   SpO2 100%   BMI 25.39 kg/m  SpO2: SpO2: 100 % O2 Device: O2 Device: Room Air O2 Flow Rate:    Intake/output summary:  Intake/Output Summary (Last 24 hours) at 09/08/2022 1458 Last data filed at 09/08/2022 1200 Gross per 24 hour  Intake 631.65 ml  Output 2050 ml  Net -1418.35 ml    LBM: Last BM Date : 09/01/22 Baseline Weight: Weight: 90.9 kg Most recent weight: Weight: 94.6 kg   Patient Active Problem List   Diagnosis Date Noted   Cardiogenic  shock ([place]) 09/05/2022   Physical deconditioning 09/05/2022   Hyponatremia 09/03/2022   Acute prostatitis 09/02/2022   Septic shock ([place]) 09/02/2022   Gross hematuria 08/31/2022   Lactic acidosis 08/31/2022   Cellulitis of scrotum 08/31/2022   Type 2 diabetes mellitus with diabetic peripheral angiopathy without gangrene, without long-term current use of insulin (HCC)    Acute kidney injury ([place])    Impaired functional mobility, balance, gait, and endurance 08/03/2022   Obstructive sleep apnea syndrome 08/03/2022   Pleural effusion, bilateral 08/03/2022   Catheter-associated urinary tract infection ([place]) 08/03/2022   Lab test positive for detection of COVID-19 virus 08/03/2022   History of urinary retention 08/03/2022   Mitral valve regurgitation 08/03/2022   UTI (urinary tract infection) 08/02/2022   Thoracic ascending aortic aneurysm ([place]) 07/23/2022   Borderline low blood pressure determined by examination 07/04/2022   Hematuria 06/25/2022   History of non-ST elevation myocardial infarction (NSTEMI) 06/15/2022   Paroxysmal atrial fibrillation ([place]) 06/15/2022   Dyslipidemia 06/15/2022   PVD (peripheral vascular disease) ([place]) 06/15/2022   Pressure ulcer of sacral region, stage 2 ([place]) 06/15/2022   Acute on chronic HFrEF (heart failure with reduced ejection fraction) ([place]) 06/15/2022   DNR (do not resuscitate) 06/15/2022   Ischemic cardiomyopathy 02/12/2022   Need for assistance  with personal care 08/03/2021   Diabetic polyneuropathy associated with type 2 diabetes mellitus ([place]) 07/28/2021   Prolonged QT interval 07/28/2021   Moderate protein-calorie malnutrition ([place]) 06/29/2021   Chronic insomnia 03/31/2021   BPH with obstruction/lower urinary tract symptoms 02/02/2018   Hyperlipidemia 04/15/2016   Vitamin D deficiency 04/09/2016   Essential hypertension 01/25/2016    Palliative Care Assessment & Plan    Assessment/Recommendations/Plan  Continue current mode of care, time trial of current interventions going on.  Patient is on Tylenol and Neurontin,     PMT continues to follow peripherally, monitoring hospital course and overall disease trajectory of illness and remains available for additional goals of care discussions depending on how the patient does. DNR as per previous discussions with the patient. Recommend continuation of palliative services in the outpatient setting, if the patient stabilizes towards a discharge. PCCM and cardiology also following.   spiritual care team for emotional and spiritual support r/t multiple life stressors/losses experienced with the progression of his illness.   Code Status: DNR  Prognosis:  Unable to determine  Discharge Planning: To Be Determined  Care plan was discussed with IDT.  Low MDM Thank you for allowing the Palliative Medicine Team to assist in the care of this patient.  Loistine Chance MD Palliative Medicine   Please contact Palliative Medicine Team phone at [phone] for questions and concerns.

## 2022-09-09 DIAGNOSIS — R310 Gross hematuria: Secondary | ICD-10-CM | POA: Diagnosis not present

## 2022-09-09 DIAGNOSIS — A419 Sepsis, unspecified organism: Secondary | ICD-10-CM | POA: Diagnosis not present

## 2022-09-09 LAB — GLUCOSE, CAPILLARY
Glucose-Capillary: 245 mg/dL — ABNORMAL HIGH (ref 70–99)
Glucose-Capillary: 205 mg/dL — ABNORMAL HIGH (ref 70–99)
Glucose-Capillary: 177 mg/dL — ABNORMAL HIGH (ref 70–99)
Glucose-Capillary: 133 mg/dL — ABNORMAL HIGH (ref 70–99)
Glucose-Capillary: 117 mg/dL — ABNORMAL HIGH (ref 70–99)

## 2022-09-09 LAB — MAGNESIUM: Magnesium: 2 mg/dL (ref 1.7–2.4)

## 2022-09-09 LAB — CBC
HCT: 26 % — ABNORMAL LOW (ref 39.0–52.0)
Hemoglobin: 7.7 g/dL — ABNORMAL LOW (ref 13.0–17.0)
MCH: 25.1 pg — ABNORMAL LOW (ref 26.0–34.0)
MCHC: 29.6 g/dL — ABNORMAL LOW (ref 30.0–36.0)
MCV: 84.7 fL (ref 80.0–100.0)
Platelets: 221 10*3/uL (ref 150–400)
RBC: 3.07 MIL/uL — ABNORMAL LOW (ref 4.22–5.81)
RDW: 16.3 % — ABNORMAL HIGH (ref 11.5–15.5)
WBC: 6.7 10*3/uL (ref 4.0–10.5)
nRBC: 0 % (ref 0.0–0.2)

## 2022-09-09 LAB — BASIC METABOLIC PANEL
Anion gap: 3 — ABNORMAL LOW (ref 5–15)
BUN: 25 mg/dL — ABNORMAL HIGH (ref 8–23)
CO2: 27 mmol/L (ref 22–32)
Calcium: 8.1 mg/dL — ABNORMAL LOW (ref 8.9–10.3)
Chloride: 98 mmol/L (ref 98–111)
Creatinine, Ser: 0.85 mg/dL (ref 0.61–1.24)
GFR, Estimated: >60 mL/min (ref >60)
Glucose, Bld: 356 mg/dL — ABNORMAL HIGH (ref 70–99)
Potassium: 4.1 mmol/L (ref 3.5–5.1)
Sodium: 128 mmol/L — ABNORMAL LOW (ref 135–145)

## 2022-09-09 LAB — COOXEMETRY PANEL
Carboxyhemoglobin: 1.5 % (ref 0.5–1.5)
Methemoglobin: 0.7 % (ref 0.0–1.5)
O2 Saturation: 67.2 %
Total hemoglobin: 8.1 g/dL — ABNORMAL LOW (ref 12.0–16.0)

## 2022-09-09 MED ORDER — COSYNTROPIN 0.25 MG IJ SOLR
0.2500 mg | Freq: Once | INTRAMUSCULAR | Status: AC
  Administered 2022-09-10: 0.25 mg via INTRAVENOUS
  Filled 2022-09-09: qty 0.25

## 2022-09-09 MED ORDER — INSULIN ASPART 100 UNIT/ML IJ SOLN
6.0000 [IU] | Freq: Three times a day (TID) | INTRAMUSCULAR | Status: DC
  Administered 2022-09-09 – 2022-09-10 (×5): 6 [IU] via SUBCUTANEOUS

## 2022-09-09 NOTE — Plan of Care (Signed)
  Problem: Education: Goal: Ability to describe self-care measures that may prevent or decrease complications (Diabetes Survival Skills Education) will improve Outcome: Progressing Goal: Individualized Educational Video(s) Outcome: Progressing   Problem: Coping: Goal: Ability to adjust to condition or change in health will improve Outcome: Progressing   Problem: Fluid Volume: Goal: Ability to maintain a balanced intake and output will improve Outcome: Progressing   Problem: Health Behavior/Discharge Planning: Goal: Ability to identify and utilize available resources and services will improve Outcome: Progressing Goal: Ability to manage health-related needs will improve Outcome: Progressing   Problem: Metabolic: Goal: Ability to maintain appropriate glucose levels will improve Outcome: Progressing   Problem: Nutritional: Goal: Maintenance of adequate nutrition will improve Outcome: Progressing Goal: Progress toward achieving an optimal weight will improve Outcome: Progressing   Problem: Skin Integrity: Goal: Risk for impaired skin integrity will decrease Outcome: Not Progressing Note: Patient unable to ambulate like at home due to invasive lines and cords    Problem: Tissue Perfusion: Goal: Adequacy of tissue perfusion will improve Outcome: Not Progressing   Problem: Fluid Volume: Goal: Hemodynamic stability will improve Outcome: Not Progressing Note: R/t noted edema    Problem: Clinical Measurements: Goal: Diagnostic test results will improve Outcome: Progressing Goal: Signs and symptoms of infection will decrease Outcome: Progressing   Problem: Respiratory: Goal: Ability to maintain adequate ventilation will improve Outcome: Progressing   Problem: Education: Goal: Knowledge of General Education information will improve Description: Including pain rating scale, medication(s)/side effects and non-pharmacologic comfort measures Outcome: Progressing   Problem:  Health Behavior/Discharge Planning: Goal: Ability to manage health-related needs will improve Outcome: Progressing   Problem: Clinical Measurements: Goal: Ability to maintain clinical measurements within normal limits will improve Outcome: Progressing Goal: Will remain free from infection Outcome: Not Progressing Note: R/t invasive lines and tubes Goal: Diagnostic test results will improve Outcome: Progressing Goal: Respiratory complications will improve Outcome: Progressing Goal: Cardiovascular complication will be avoided Outcome: Progressing   Problem: Activity: Goal: Risk for activity intolerance will decrease Outcome: Not Progressing Note: R/t bedrest on pressors   Problem: Nutrition: Goal: Adequate nutrition will be maintained Outcome: Progressing   Problem: Coping: Goal: Level of anxiety will decrease Outcome: Progressing   Problem: Elimination: Goal: Will not experience complications related to bowel motility Outcome: Progressing Goal: Will not experience complications related to urinary retention Outcome: Progressing   Problem: Pain Managment: Goal: General experience of comfort will improve Outcome: Not Progressing   Problem: Safety: Goal: Ability to remain free from injury will improve Outcome: Not Progressing   Problem: Skin Integrity: Goal: Risk for impaired skin integrity will decrease Outcome: Not Progressing   Problem: Urinary Elimination: Goal: Signs and symptoms of infection will decrease Outcome: Progressing

## 2022-09-09 NOTE — Progress Notes (Signed)
NAME:  Peter Yang, MRN:  [id number], DOB:  [birth year], LOS: 8 ADMISSION DATE:  08/31/2022, CONSULTATION DATE:  11/11 REFERRING MD:  Lelon Mast FOR CONSULT: sepsis   History of Present Illness:  Peter Yang is an 66 y.o. M who presented to the [facility] ED with a chief complaint of hematuria.  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  Sent to the emergency department SNF staff on 11/11.  At the emergency department he developed hypotension with a BP of 83/63. WBC 14.2, lactate 5.0 > 7.9.  Code sepsis was called.  Blood cultures and urine cultures were obtained.  Patient was given fluid bolus per sepsis guidelines.  Patient with complicated infectious history-started on meropenem and amoxicillin by TRH.  Urology was consulted for hematuria and concern for forniers.  Recommends monitoring with possible cystoscopy once Eliquis washout.  [facility] admitted patient.  PCCM was consulted for assistance with sepsis with high lactate.  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% BPH (hx prostate CA) SNF resident  Significant Hospital Events: Including procedures, antibiotic start and stop dates in addition to other pertinent events   11/11 Admit by TRH, BC>, UC>, PCCM consult, Mero and amoxicillin> 11/12 Started on peripheral Levophed for persistent hypotension, albumin 11/13 Troponin rising from 263 to 771, DVT/PE ruled out 11/14 dobutamine started for low coox, lower EF on echo 11/15 trial of restarting heparin 11/17 off dobutamine 11/19 coox 93, remains on pressors  Interim History / Subjective:  Coox 67 CVP 8 Decreasing NE requirements, now on 3 mcg/min No complaints other than intermittent R knee pain- chronic and left  stump pain in which lidocaine patches are helping.   Objective   Blood pressure 106/60, pulse 73, temperature 97.9 F (36.6 C), temperature source Oral, resp. rate 12, height '6\' 4"'$  (1.93 m), weight 100.1 kg, SpO2 96 %. CVP:  [0 mmHg-18 mmHg] 0 mmHg      Intake/Output Summary (Last 24 hours) at 09/09/2022 0921 Last data filed at 09/09/2022 0630 Gross per 24 hour  Intake 1014.94 ml  Output 3065 ml  Net -2050.06 ml   Filed Weights   09/07/22 0500 09/08/22 0500 09/09/22 0500  Weight: 94.6 kg 94.6 kg 100.1 kg   Examination: General:  chronically ill and pale male sitting upright in bed in NAD HEENT: MM pink/mildly dry, no JVD Neuro: Alert and oriented x 4, MAE. Generally weak CV: rr, NSR 70s, no murmur PULM:  non labored, CTA, on room air GI: soft, bs+, NT/ ND, foley> urine slight pink tint, some old dark bloody sediment in tubing Extremities: warm/dry, trace left ankle edema, L BKA Skin: no rashes   UOP 3L/ 24hrs  -1.9L/ 24hrs Net -10L Wts 94.6> 94.6> 100.1 (likely not accurate given ongoing neg balances)  CBGs 157> 245 Na 131> 128, K 4.1, sCr 0.85, Mag 2, Hgb 7.7, WBC 6.7, am cortisol 3.7  Resolved Hospital Problem list   Hypophosphatemia NAGMA  Assessment & Plan:   Shock, initially suspected septic from UTI, now looking more likely cardiogenic shock. All cultures have remained negative this admission and he has been on antibiotics for weeks pre-dating this. Acute drop in EF, low coox. Acute on chronic HFrEF, ICM, CAD with stent in 05/2022 >  09/03/22 TTE> EF now 30-35, G2DD  Lactic acidosis, secondary to above - am cortisol 3.7 concerning for AI.  ACTH test ordered for am.  - weaning NE for MAP goal > 65 - cont midodrine '10mg'$  TID - trending daily coox.  Remains off dobutamine - appreciate Cardiology input> he is not a candidate for revascularization, home inotrope's, transplant, or LVAD.  With ongoing hypotension, defer GDMT   UTI-- ESBL E Coli and CRE Proteus in the  past (diagnosed at [facility]) Prostatitis Scrotal cellulitis -continue ertapenem per ID, OPAT orders placed, plans for 4-6 wks treatment for prostatitis -Appreciate ID's management.  Plans for him to follow-up with infectious disease team in [facility] as he previously has established care with them. -as below foley remains in place till urology follow up as outpatient   Hematuria due to UTI Known prostate cancer, under surveillance with urology - continue eliquis, plavix - H/H remains stable  - Eventually need cystoscopy per urology as outpatient - foley to remain in place till then - Continue Flomax and finasteride.   DM2 - CBGs remains > 200 - increase levemir 20> 22 units twice daily - increase mealtime insulin 5> 6 units 3 times daily AC - cont prn SSI   Hyponatremia, hypervolemic Anasarca -Intermittent diuresis per cardiology> continue holding  pAF -eliquis, remains in NSR  Deconditioning -f/u PT/OT recs -Has prosthesis at bedside and can do OOB mobility  Chronic Stage 2 sacral ulcer -Continue wound care  Insomnia, phantom limb pain LLE -Continue increased nightly gabapentin dose -Continue melatonin QHS -trazodone scheduled at bedtime - Appreciate palliative care's management-added Ambien   Best Practice (right click and "Reselect all SmartList Selections" daily)  Diet/type: Regular consistency > carb modified/ heart healthy DVT prophylaxis: eliquis GI prophylaxis: PPI- home med Lines: PICC Foley:  yes and it is still needed Code Status:  DNR   Critical care time:  35 mins      Kennieth Rad, MSN, AG-ACNP-BC [facility] 09/09/2022, 9:22 AM  See Amion for pager If no response to pager, please call PCCM consult pager After 7:00 pm call Elink

## 2022-09-09 NOTE — Inpatient Diabetes Management (Signed)
Inpatient Diabetes Program Recommendations  AACE/ADA: New Consensus Statement on Inpatient Glycemic Control (2015)  Target Ranges:  Prepandial:   less than 140 mg/dL      Peak postprandial:   less than 180 mg/dL (1-2 hours)      Critically ill patients:  140 - 180 mg/dL   Lab Results  Component Value Date   GLUCAP 205 (H) 09/09/2022   HGBA1C 6.6 (H) 08/03/2022    Review of Glycemic Control  Latest Reference Range & Units 09/08/22 11:56 09/08/22 16:12 09/08/22 20:58 09/09/22 05:38 09/09/22 07:35  Glucose-Capillary 70 - 99 mg/dL 188 (H) 217 (H) 187 (H) 245 (H) 205 (H)  (H): Data is abnormally high Diabetes history: Type 2 DM Outpatient Diabetes medications: Jardiance 10 mg QD, Metformin 1000 mg BID Current orders for Inpatient glycemic control: Levemir 20 units BID, Novolog 5 units TID, Novolog 0-15 units TID & HS  Inpatient Diabetes Program Recommendations:    Consider increasing meal coverage to Novolog 8 units TID (assuming patient consuming >50% of meals).   Thanks, Bronson Curb, MSN, RNC-OB Diabetes Coordinator [phone] (8a-5p)

## 2022-09-09 NOTE — Progress Notes (Signed)
1930 patient alert able to make all needs known patient request all night meds now so they can work for him to go to sleep early

## 2022-09-09 NOTE — Progress Notes (Addendum)
Rounding Note    Patient Name: Peter Yang Date of Encounter: 09/09/2022  [place] Cardiologist:   Dr Blenda Bridegroom, [place], [place] [facility] Dr Debara Pickett  Subjective   Sleepy but rouses easily to verbal, no CP or SOB  Inpatient Medications    Scheduled Meds:  apixaban  5 mg Oral BID   atorvastatin  80 mg Oral Daily   Chlorhexidine Gluconate Cloth  6 each Topical Daily   clopidogrel  75 mg Oral Daily   finasteride  5 mg Oral Daily   gabapentin  600 mg Oral QHS   insulin aspart  0-15 Units Subcutaneous TID WC   insulin aspart  0-5 Units Subcutaneous QHS   insulin aspart  5 Units Subcutaneous TID WC   insulin detemir  20 Units Subcutaneous BID   lidocaine  1 patch Transdermal Daily   melatonin  5 mg Oral QHS   midodrine  10 mg Oral TID with meals   pantoprazole  40 mg Oral Daily   sodium chloride flush  10-40 mL Intracatheter Q12H   tamsulosin  0.4 mg Oral Daily   traZODone  50 mg Oral QHS   Continuous Infusions:  sodium chloride Stopped (09/04/22 0558)   sodium chloride Stopped (09/04/22 0855)   ertapenem Stopped (09/08/22 1650)   norepinephrine (LEVOPHED) Adult infusion 4 mcg/min (09/09/22 0700)   PRN Meds: sodium chloride, acetaminophen **OR** acetaminophen, mouth rinse, polyvinyl alcohol, sodium chloride flush, zolpidem   Vital Signs    CVP 9-11 overnight, now 10 Vitals:   09/09/22 0615 09/09/22 0630 09/09/22 0645 09/09/22 0755  BP: 104/64 110/62 106/60   Pulse: 75 75 73   Resp: (!) _0 Temp:    97.9 F (36.6 C)  TempSrc:    Oral  SpO2: 98% 95% 96%   Weight:      Height:        Intake/Output Summary (Last 24 hours) at 09/09/2022 0925 Last data filed at 09/09/2022 0630 Gross per 24 hour  Intake 1014.94 ml  Output 3065 ml  Net -2050.06 ml      09/09/2022    5:00 AM 09/08/2022    5:00 AM 09/07/2022    5:00 AM  Last 3 Weights  Weight (lbs) 220 lb 10.9 oz 208 lb 8.9 oz 208 lb 8.9 oz  Weight (kg) 100.1 kg 94.6 kg 94.6  kg      Telemetry    SR, freq vent ectopy- Personally Reviewed  ECG    None today- Personally Reviewed  Physical Exam   GEN: Chronically ill-appearing male, no acute distress.   Neck: no JVD Cardiac: RRR, no murmurs, rubs, or gallops.  Respiratory: few Rales bases bilaterally. GI: Soft, nontender, non-distended  MS: No edema; No new deformity. S/p L BKA Neuro:  Nonfocal  Psych: Normal affect   Labs       Latest Ref Rng & Units 09/05/2022    5:51 AM 09/06/2022    5:00 AM 09/07/2022    4:58 AM 09/08/2022    4:01 AM 09/09/2022    4:15 AM  Total Hgb  Total Hgb 12.0 - 16.0 g/dL 7.8  8.2  8.5  8.1  8.1       Latest Ref Rng & Units 09/05/2022    5:51 AM 09/06/2022    5:00 AM 09/07/2022    4:58 AM 09/08/2022    4:01 AM 09/09/2022    4:15 AM  O2 Sat  O2 Sat % 63.4  63.4  66.6  93  67.2       Latest Ref Rng & Units 09/05/2022    5:51 AM 09/06/2022    5:00 AM 09/07/2022    4:58 AM 09/08/2022    4:01 AM 09/09/2022    4:15 AM  Carboxy Hgb  Carboxy Hgb 0.5 - 1.5 % 1.2  1.5  2.4  2.7  1.5       Latest Ref Rng & Units 09/05/2022    5:51 AM 09/06/2022    5:00 AM 09/07/2022    4:58 AM 09/08/2022    4:01 AM 09/09/2022    4:15 AM  Met Hgb  Met Hgb 0.0 - 1.5 % <0.7  0.9  <0.7  <0.7  0.7       09/09/2022    5:00 AM 09/09/2022    5:45 AM 09/09/2022    6:00 AM 09/09/2022    6:15 AM 09/09/2022    6:30 AM  CVP  CVP (mmHg) 5 mmHg 16 mmHg 10 mmHg 7 mmHg 0 mmHg    High Sensitivity Troponin:   Recent Labs  Lab 09/01/22 0040 09/02/22 1935  TROPONINIHS 263* 771*     Chemistry Recent Labs  Lab 09/04/22 0604 09/05/22 0225 09/07/22 0458 09/08/22 0401 09/09/22 0415 09/09/22 0603  NA 132*   < > 133* 131*  --  128*  K 3.8   < > 4.2 4.2  --  4.1  CL 106   < > 100 101  --  98  CO2 20*   < > 27 26  --  27  GLUCOSE 238*   < > 192* 173*  --  356*  BUN 27*   < > 26* 28*  --  25*  CREATININE 0.90   < > 0.75 0.78  --  0.85  CALCIUM 7.8*   < > 8.1* 8.1*  --  8.1*   MG 2.0   < > 1.7 2.0 2.0  --   PROT 5.4*  --   --   --   --   --   ALBUMIN 2.4*  --   --   --   --   --   AST 69*  --   --   --   --   --   ALT 124*  --   --   --   --   --   ALKPHOS 106  --   --   --   --   --   BILITOT 0.6  --   --   --   --   --   GFRNONAA >60   < > >60 >60  --  >60  ANIONGAP 6   < > 6 4*  --  3*   < > = values in this interval not displayed.    Lipids No results for input(s): "CHOL", "TRIG", "HDL", "LABVLDL", "LDLCALC", "CHOLHDL" in the last 168 hours.  Hematology Recent Labs  Lab 09/07/22 0458 09/08/22 0401 09/09/22 0415  WBC 9.1 7.7 6.7  RBC 3.10* 3.04* 3.07*  HGB 8.0* 7.9* 7.7*  HCT 26.4* 25.7* 26.0*  MCV 85.2 84.5 84.7  MCH 25.8* 26.0 25.1*  MCHC 30.3 30.7 29.6*  RDW 16.3* 16.1* 16.3*  PLT 223 196 221   Thyroid  Lab Results  Component Value Date   TSH 5.441 (H) 08/03/2022     BNP Recent Labs  Lab 09/03/22 0415 09/06/22 0500  BNP 3,094.3* 2,623.1*    DDimer  Recent Labs  Lab 09/02/22 1935  DDIMER 2.00*     Radiology    No results found.  Cardiac Studies   Echo 09/03/22  1. Left ventricular ejection fraction, by estimation, is 30 to 35%. The left ventricle has moderately decreased function. The left ventricle demonstrates regional wall motion abnormalities (see scoring diagram/findings for description). The left ventricular internal cavity size was mildly dilated. Left ventricular diastolic parameters are consistent with Grade II diastolic dysfunction (pseudonormalization).   2. Right ventricular systolic function is moderately reduced. The right ventricular size is mildly enlarged. There is mildly elevated pulmonary artery systolic pressure. The estimated right ventricular systolic pressure is 44.3 mmHg.   3. Left atrial size was moderately dilated.   4. Right atrial size was mildly dilated.   5. The mitral valve is abnormal. Moderate mitral valve regurgitation. No evidence of mitral stenosis.   6. The aortic valve is tricuspid.  There is mild calcification of the aortic valve. Aortic valve regurgitation is not visualized. Aortic valve sclerosis is present, with no evidence of aortic valve stenosis.   7. Aortic dilatation noted. There is mild dilatation of the aortic root, measuring 42 mm.   8. The inferior vena cava is normal in size with <50% respiratory variability, suggesting right atrial pressure of 8 mmHg.   Comparison(s): Changes from prior study are noted. EF significantly reduced compared to prior, now with more prominent anterior wall motion abnormalities. Findings communicated to PCCM team.   Conclusion(s)/Recommendation(s): No left ventricular mural or apical  thrombus/thrombi.   Patient Profile     66 y.o. male with a hx of nonrevascularizable CAD, ascending TAA (not seen on CT 07/2022, 72m aortic root by echo 11/14), TIA, PAF found during 05/2022 admission. DM2 with polyneuropathy and diabetic ulcer, L BKA, HTN, HLD, chronic HFrEF, BPH, UTIs, bowel perforation, prsostate CA, OSA, prior retroperitoneal abscess who was admitted 11/11 with ongoing gross hematuria, shock req multiple pressors (midodrine at home), recent COVID, ESBL UTI, lactic acidosis, AKI, scrotal cellulitis, DNR.  Cards saw 11/15 for CHF exacerbation w/ EF 30-35%, grade 2 dd, Ao sclerosis (EF reduced from previous)  Assessment & Plan    Acute on chronic CHF, CGS is part of his shock profile - EF now 30%  - Coox 67.2% today  - CVP 11 -  On levophed down to 4 mcg/min, off dobutamin and on home dose midodrine 10 mg tid -  1991 cc diuresis yesterday, 3 L out - not currently on a diuretic  2. Hx PAF and CAD w/ PCI RCA 04/23, multiple occluded vessels, not amenable to PCI -  back on eliquis  -No ischemic symptoms - back on plavix   3. Prostatitis, deconditioning, social and other issues, per Dr. MVerlee Monte Note patient is DNR   For questions or updates, please contact CAcres GreenPlease consult [email or web address] for contact info  under     Signed, RRosaria Ferries PA-C  09/09/2022, 9:25 AM    Personally seen and examined. Agree with above.  66year old with acute on chronic systolic heart failure, shock, paroxysmal atrial fibrillation, severe coronary artery disease with multiple occluded vessels not amenable to PCI, prostatitis  DNR.   Chronically ill-appearing.  Most recent Co. ox 67% on Levophed 4 mcg.  Dobutamine is off.  Continuing with midodrine 10 3 times daily for blood pressure support.  He is on both Plavix 75 mg as well as Eliquis 5 mg twice a day for treatment of underlying CAD as well as atrial fibrillation.  At risk for further bleeding  events.  He is not a candidate for home inotropes, transplant, LVAD.  Continue with palliative care discussions.  CRITICAL CARE Performed by: Candee Furbish   Total critical care time: 35 minutes  Critical care time was exclusive of separately billable procedures and treating other patients.  Critical care was necessary to treat or prevent imminent or life-threatening deterioration.  Critical care was time spent personally by me on the following activities: development of treatment plan with patient and/or surrogate as well as nursing, discussions with consultants, evaluation of patient's response to treatment, examination of patient, obtaining history from patient or surrogate, ordering and performing treatments and interventions, ordering and review of laboratory studies, ordering and review of radiographic studies, pulse oximetry and re-evaluation of patient's condition.   Ejection fraction on echocardiogram is 30 to 35%.  Spironolactone has been discontinued.  Not able to tolerate goal-directed medical therapy secondary to hypotension.  Does not require diuresis at this time.  Candee Furbish, MD

## 2022-09-09 NOTE — Progress Notes (Signed)
Daily Progress Note   Patient Name: Peter Yang       Date: 09/09/2022 DOB: 05/01/1956  Age: 66 y.o. MRN#: [id number] Attending Physician: Margaretha Seeds, MD Primary Care Physician: Clovia Cuff, MD Admit Date: 08/31/2022  Reason for Consultation/Follow-up: Establishing goals of care  Patient Profile/HPI: 66 year old man, from [place], admitted to the hospital 08/31/2022 because of hematuria. He has a past medical history of CAD/NSTEMI, AAA, TIA, PAF on Eliquis, diabetes, hypertension, heart failure with reduced ejection fraction left ventricular ejection fraction 40-45%, history of recent COVID. Patient admitted to hospital medicine service because of a complicated UTI and hematuria, cultures positive for ESBL, antibiotics adjusted, urology also consulted for scrotal erythema and cellulitis, remains on antibiotics, hospital course complicated by ongoing hypotension requiring peripheral Levophed and Dobutamine support. Patient also on intravenous heparin infusion. PMT consulted for goals of care discussions.  Subjective: Patient is resting in the bed, he is able to feed himself, is also on lidocaine patches for phantom limb pain  Cardiology following, off Dobutamine, Levophed being weaned down, is on Midodrine,   medication history reviewed Chart reviewed     Review of Systems  Constitutional:  Positive for malaise/fatigue.  Musculoskeletal:  Positive for joint pain.          Psychiatric/Behavioral:  The patient has insomnia.     Physical Exam Constitutional:      Appearance: He is ill-appearing.  Pulmonary:     Effort: Pulmonary effort is normal.  Musculoskeletal:     Comments: Left BKA.  Skin:    Coloration: Skin is pale.            Vital Signs:  BP (!) 126/103   Pulse 83   Temp (!) 97.5 F (36.4 C) (Oral)   Resp 15   Ht '6\' 4"'$  (1.93 m)   Wt 100.1 kg   SpO2 100%   BMI 26.86 kg/m  SpO2: SpO2: 100 % O2 Device: O2 Device: Room Air O2 Flow Rate:    Intake/output summary:  Intake/Output Summary (Last 24 hours) at 09/09/2022 1329 Last data filed at 09/09/2022 1200 Gross per 24 hour  Intake 1008.49 ml  Output 2919 ml  Net -1910.51 ml    LBM: Last BM Date : 09/07/22 Baseline Weight: Weight: 90.9 kg Most recent weight: Weight: 100.1 kg  Patient Active Problem List   Diagnosis Date Noted  . Palliative care by specialist 09/08/2022  . Goals of care, counseling/discussion 09/08/2022  . Cardiogenic shock ([place]) 09/05/2022  . Physical deconditioning 09/05/2022  . Hyponatremia 09/03/2022  . Acute prostatitis 09/02/2022  . Septic shock ([place]) 09/02/2022  . Gross hematuria 08/31/2022  . Lactic acidosis 08/31/2022  . Cellulitis of scrotum 08/31/2022  . Type 2 diabetes mellitus with diabetic peripheral angiopathy without gangrene, without long-term current use of insulin ([place])   . Acute kidney injury ([place])   . Impaired functional mobility, balance, gait, and endurance 08/03/2022  . Obstructive sleep apnea syndrome 08/03/2022  . Pleural effusion, bilateral 08/03/2022  . Catheter-associated urinary tract infection ([place]) 08/03/2022  . Lab test positive for detection of COVID-19 virus 08/03/2022  . History of urinary retention 08/03/2022  . Mitral valve regurgitation 08/03/2022  . UTI (urinary tract infection) 08/02/2022  . Thoracic ascending aortic aneurysm ([place]) 07/23/2022  . Borderline low blood pressure determined by examination 07/04/2022  . Hematuria 06/25/2022  . History of non-ST elevation myocardial infarction (NSTEMI) 06/15/2022  . Paroxysmal atrial fibrillation ([place]) 06/15/2022  . Dyslipidemia 06/15/2022  . PVD (peripheral vascular disease) ([place]) 06/15/2022  . Pressure ulcer of sacral region, stage 2 ([place])  06/15/2022  . Acute on chronic HFrEF (heart failure with reduced ejection fraction) ([place]) 06/15/2022  . DNR (do not resuscitate) 06/15/2022  . Ischemic cardiomyopathy 02/12/2022  . Need for assistance with personal care 08/03/2021  . Diabetic polyneuropathy associated with type 2 diabetes mellitus ([place]) 07/28/2021  . Prolonged QT interval 07/28/2021  . Moderate protein-calorie malnutrition ([place]) 06/29/2021  . Chronic insomnia 03/31/2021  . BPH with obstruction/lower urinary tract symptoms 02/02/2018  . Hyperlipidemia 04/15/2016  . Vitamin D deficiency 04/09/2016  . Essential hypertension 01/25/2016    Palliative Care Assessment & Plan    Assessment/Recommendations/Plan  Continue current mode of care, time trial of current interventions going on.  Patient is on Tylenol and Neurontin, lidocaine patch.    PMT continues to follow peripherally, monitoring hospital course and overall disease trajectory of illness and remains available for additional goals of care discussions depending on how the patient does. DNR as per previous discussions with the patient. As per cardiology, not candidate for home inotropes, transplant or LVAD. No next of kin, no family, no HCPOA, does not want friends contacted. Came from facility, will recommend palliative or even hospice support once patient stabilizes to the point of being considered for discharge. Monitor hospital course for now.    Code Status: DNR  Prognosis:  Unable to determine  Discharge Planning: To Be Determined  Care plan was discussed with IDT.  mod MDM Thank you for allowing the Palliative Medicine Team to assist in the care of this patient.  Loistine Chance MD Palliative Medicine   Please contact Palliative Medicine Team phone at [phone] for questions and concerns.

## 2022-09-10 DIAGNOSIS — R310 Gross hematuria: Secondary | ICD-10-CM | POA: Diagnosis not present

## 2022-09-10 DIAGNOSIS — N410 Acute prostatitis: Secondary | ICD-10-CM | POA: Diagnosis not present

## 2022-09-10 DIAGNOSIS — N179 Acute kidney failure, unspecified: Secondary | ICD-10-CM | POA: Diagnosis not present

## 2022-09-10 DIAGNOSIS — Z515 Encounter for palliative care: Secondary | ICD-10-CM | POA: Diagnosis not present

## 2022-09-10 DIAGNOSIS — R570 Cardiogenic shock: Secondary | ICD-10-CM | POA: Diagnosis not present

## 2022-09-10 LAB — GLUCOSE, CAPILLARY
Glucose-Capillary: 123 mg/dL — ABNORMAL HIGH (ref 70–99)
Glucose-Capillary: 181 mg/dL — ABNORMAL HIGH (ref 70–99)
Glucose-Capillary: 190 mg/dL — ABNORMAL HIGH (ref 70–99)
Glucose-Capillary: 226 mg/dL — ABNORMAL HIGH (ref 70–99)

## 2022-09-10 LAB — ACTH STIMULATION, 3 TIME POINTS
Cortisol, 30 Min: 20.6 ug/dL
Cortisol, 60 Min: 23.4 ug/dL
Cortisol, Base: 18.5 ug/dL

## 2022-09-10 LAB — CBC
HCT: 24.8 % — ABNORMAL LOW (ref 39.0–52.0)
Hemoglobin: 7.6 g/dL — ABNORMAL LOW (ref 13.0–17.0)
MCH: 25.9 pg — ABNORMAL LOW (ref 26.0–34.0)
MCHC: 30.6 g/dL (ref 30.0–36.0)
MCV: 84.4 fL (ref 80.0–100.0)
Platelets: 223 10*3/uL (ref 150–400)
RBC: 2.94 MIL/uL — ABNORMAL LOW (ref 4.22–5.81)
RDW: 16.2 % — ABNORMAL HIGH (ref 11.5–15.5)
WBC: 7.7 10*3/uL (ref 4.0–10.5)
nRBC: 0 % (ref 0.0–0.2)

## 2022-09-10 LAB — RENAL FUNCTION PANEL
Albumin: 2.5 g/dL — ABNORMAL LOW (ref 3.5–5.0)
Anion gap: 7 (ref 5–15)
BUN: 28 mg/dL — ABNORMAL HIGH (ref 8–23)
CO2: 24 mmol/L (ref 22–32)
Calcium: 8.1 mg/dL — ABNORMAL LOW (ref 8.9–10.3)
Chloride: 99 mmol/L (ref 98–111)
Creatinine, Ser: 0.89 mg/dL (ref 0.61–1.24)
GFR, Estimated: >60 mL/min (ref >60)
Glucose, Bld: 243 mg/dL — ABNORMAL HIGH (ref 70–99)
Phosphorus: 3.8 mg/dL (ref 2.5–4.6)
Potassium: 4.2 mmol/L (ref 3.5–5.1)
Sodium: 130 mmol/L — ABNORMAL LOW (ref 135–145)

## 2022-09-10 LAB — COOXEMETRY PANEL
Carboxyhemoglobin: 1.2 % (ref 0.5–1.5)
Methemoglobin: <0.7 % (ref 0.0–1.5)
O2 Saturation: 75.1 %
Total hemoglobin: 7.8 g/dL — ABNORMAL LOW (ref 12.0–16.0)

## 2022-09-10 MED ORDER — NYSTATIN 100000 UNIT/GM EX POWD
Freq: Two times a day (BID) | CUTANEOUS | Status: DC
  Administered 2022-10-01 – 2022-10-08 (×3): 1 via TOPICAL
  Filled 2022-09-10 (×4): qty 15

## 2022-09-10 MED ORDER — INSULIN DETEMIR 100 UNIT/ML ~~LOC~~ SOLN
22.0000 [IU] | Freq: Two times a day (BID) | SUBCUTANEOUS | Status: DC
  Administered 2022-09-10: 22 [IU] via SUBCUTANEOUS
  Filled 2022-09-10 (×2): qty 0.22

## 2022-09-10 MED ORDER — HYDROCORTISONE SOD SUC (PF) 100 MG IJ SOLR
100.0000 mg | Freq: Three times a day (TID) | INTRAMUSCULAR | Status: DC
  Administered 2022-09-10 – 2022-09-16 (×18): 100 mg via INTRAVENOUS
  Filled 2022-09-10 (×18): qty 2

## 2022-09-10 NOTE — Progress Notes (Signed)
Daily Progress Note   Patient Name: Peter Yang       Date: 09/10/2022 DOB: 03/25/1956  Age: 66 y.o. MRN#: [id number] Attending Physician: Margaretha Seeds, MD Primary Care Physician: Clovia Cuff, MD Admit Date: 08/31/2022 Length of Stay: 9 days  Reason for Consultation/Follow-up: Establishing goals of care  Subjective:   CC: Patient wanted to speak with NP student who was on palliative care team last week.  Following up with patient regarding this.  Subjective:  Reviewed EMR prior to presenting to bedside.  Presented to bedside to follow-up with patient myself.  Introduced myself as a member of the palliative care team and a colleague of the NP student.  Tried to inquire if patient wanted to discuss anything or if there is anything I could be helpful with at this time.  Patient noted that he only wanted to talk to the NP student because she knows what they already talked about.  Offered that NP student may not be back for a while and so encouraged conversations and that providers want to support him during his medical journey.  Patient voiced appreciation for visit but did not want to discuss further.  Acknowledged patient's decision and noted our team would continue to follow along.  Discussed care with bedside RN during the day.  Patient had wanted to speak to the provider he had a discussion with the last week.  Through diligent work bedside RN was able to determine that was the NP student on the palliative medicine team.  Unfortunately she is not here this week.  Updated bedside RN after visit.  Review of Systems Denies any symptoms of concern at this time.  Objective:   Vital Signs:  BP (!) 93/53   Pulse 92   Temp (!) 97.5 F (36.4 C) (Oral)   Resp (!) 23   Ht '6\' 4"'$  (1.93 m)   Wt 100 kg   SpO2 98%   BMI 26.84 kg/m   Physical Exam: General: NAD, alert Eyes: No drainage noted HENT: Dry mucous membranes Cardiovascular: RRR Respiratory: no increased work of  breathing noted, not in respiratory distress Extremities: Moving upper extremities spontaneously Skin: no rashes or lesions on visible skin Neuro: Alert, awake Psych: Withdrawn  Imaging: I personally reviewed recent imaging.   Assessment & Plan:   Assessment: 66 year old man, from [place], admitted to the hospital 08/31/2022 because of hematuria. He has a past medical history of CAD/NSTEMI, AAA, TIA, PAF on Eliquis, diabetes, hypertension, heart failure with reduced ejection fraction left ventricular ejection fraction 40-45%, history of recent COVID. Patient admitted to hospital medicine service because of a complicated UTI and hematuria, cultures positive for ESBL, antibiotics adjusted, urology also consulted for scrotal erythema and cellulitis, remains on antibiotics, hospital course complicated by ongoing hypotension requiring peripheral Levophed and Dobutamine support. Patient also on intravenous heparin infusion. PMT consulted for goals of care discussions.   Recommendations/Plan: # Complex medical decision making/goals of care:  -Attempted to support further discussions with patient at this time.  Patient does not wish to further discuss medical goals for care currently.  Respected patient's decision regarding this.  Did PMT would continue to follow along. - As per EMR review, DNR as per previous discussions with the patient. As per cardiology, not candidate for home inotropes, transplant or LVAD. No next of kin, no family, no HCPOA, does not want friends contacted. Came from facility, would recommend palliative or even hospice support once patient stabilizes to the point of being  considered for discharge. Monitor hospital course for now  -  Code Status: DNR  # Symptom management:  Pain   -Patient is on Tylenol and Neurontin, lidocaine patch.   # Psychosocial Support:  -No next of kin, no family, no HCPOA, does not want friends contacted.   # Discharge  Planning: Likely return to facility  Discussed with: bedside RN, patient  Thank you for allowing the palliative care team to participate in the care [name].  Chelsea Aus, DO Palliative Care Provider PMT # [phone]  This provider spent a total of 36 minutes providing patient's care.  Includes review of EMR, discussing care with other staff members involved in patient's medical care, obtaining relevant history and information from patient and/or patient's family, and personal review of imaging and lab work. Greater than 50% of the time was spent counseling and coordinating care related to the above assessment and plan.

## 2022-09-10 NOTE — Progress Notes (Signed)
Occupational Therapy Treatment Patient Details Name: Trashawn Oquendo MRN: [id number] DOB: 10/18/1956 Today's Date: 09/10/2022   History of present illness Pt is a 66 year old man admitted from SNF with gross hematuria, UTI and sepsis on 11/11. PMH: CAD, NSTEMI, AAA, TIA, PAF, DM, diabetic neuropathy, L BKA, HTN, HLD, CHF with EF of 40-45%, recent admissions with sepsis, UTI, hypoglycemia and COVID.   OT comments  Patient participated in therapeutic activity sitting EOB with increased time and min guard for sitting balance. Patient continues to report desire to go home at time of d/c during session. Patient would need physical assistance 24/7 in next level of care to be successful. Patient's discharge plan remains appropriate at this time. OT will continue to follow acutely.     Recommendations for follow up therapy are one component of a multi-disciplinary discharge planning process, led by the attending physician.  Recommendations may be updated based on patient status, additional functional criteria and insurance authorization.    Follow Up Recommendations  Skilled nursing-short term rehab (<3 hours/day)     Assistance Recommended at Discharge Frequent or constant Supervision/Assistance  Patient can return home with the following  A lot of help with walking and/or transfers;A lot of help with bathing/dressing/bathroom;Assistance with cooking/housework;Direct supervision/assist for medications management;Direct supervision/assist for financial management;Assist for transportation;Help with stairs or ramp for entrance   Equipment Recommendations  Other (comment) (defer to next venue)    Recommendations for Other Services      Precautions / Restrictions Precautions Precautions: Fall Precaution Comments: L BKA with prosthesis in room, monitor BP Restrictions Weight Bearing Restrictions: No       Mobility Bed Mobility                    Transfers                          Balance Overall balance assessment: Needs assistance Sitting-balance support: No upper extremity supported, Feet supported Sitting balance-Leahy Scale: Good                                     ADL either performed or assessed with clinical judgement   ADL                                              Extremity/Trunk Assessment              Vision       Perception     Praxis      Cognition Arousal/Alertness: Awake/alert Behavior During Therapy: WFL for tasks assessed/performed, Flat affect Overall Cognitive Status: No family/caregiver present to determine baseline cognitive functioning                                 General Comments: patient was noted to be self limited for activity during session.        Exercises Other Exercises Other Exercises: patient was able to participate in elbow flexion/extension bilaterally  and R shoulder with theraband. L shoulder participated in AAROM to maintain ROM within pain level sitting EOB    Shoulder Instructions       General Comments patietns blood pressure was 91/61 mmhg (68  MAP)100% RA sitting EOB.  After few mins EOB 105/71 (MAP 80). at end of session patients blood pressure was 112/72 mmhg and MAP of 86    Pertinent Vitals/ Pain          Home Living                                          Prior Functioning/Environment              Frequency  Min 2X/week        Progress Toward Goals  OT Goals(current goals can now be found in the care plan section)  Progress towards OT goals: Not progressing toward goals - comment (self limiting)     Plan Discharge plan remains appropriate    Co-evaluation                 AM-PAC OT "6 Clicks" Daily Activity     Outcome Measure   Help from another person eating meals?: None Help from another person taking care of personal grooming?: A Little Help from another person toileting,  which includes using toliet, bedpan, or urinal?: A Lot Help from another person bathing (including washing, rinsing, drying)?: A Lot Help from another person to put on and taking off regular upper body clothing?: A Little Help from another person to put on and taking off regular lower body clothing?: A Lot 6 Click Score: 16    End of Session    OT Visit Diagnosis: Muscle weakness (generalized) (M62.81);Unsteadiness on feet (R26.81);Other abnormalities of gait and mobility (R26.89)   Activity Tolerance Other (comment) (self limting)   Patient Left in bed;with call bell/phone within reach;with bed alarm set   Nurse Communication Mobility status        Time: 1400-1417 OT Time Calculation (min): 17 min  Charges: OT General Charges $OT Visit: 1 Visit OT Treatments $Therapeutic Activity: 8-22 mins  Rennie Plowman, MS Acute Rehabilitation Department Office# [phone]   Marcellina Millin 09/10/2022, 2:55 PM

## 2022-09-10 NOTE — Progress Notes (Signed)
NAME:  Peter Yang, MRN:  [id number], DOB:  10-01-1956, LOS: 9 ADMISSION DATE:  08/31/2022, CONSULTATION DATE:  11/11 REFERRING MD:  Lelon Mast FOR CONSULT: sepsis   History of Present Illness:  Peter Yang is an 66 y.o. M who presented to the [facility] ED with a chief complaint of hematuria.  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  Sent to the emergency department SNF staff on 11/11.  At the emergency department he developed hypotension with a BP of 83/63. WBC 14.2, lactate 5.0 > 7.9.  Code sepsis was called.  Blood cultures and urine cultures were obtained.  Patient was given fluid bolus per sepsis guidelines.  Patient with complicated infectious history-started on meropenem and amoxicillin by [facility].  Urology was consulted for hematuria and concern for forniers.  Recommends monitoring with possible cystoscopy once Eliquis washout.  [facility] admitted patient.  PCCM was consulted for assistance with sepsis with high lactate.  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% BPH (hx prostate CA) SNF resident  Significant Hospital Events: Including procedures, antibiotic start and stop dates in addition to other pertinent events   11/11 Admit by [facility], BC>, UC>, PCCM consult, Mero and amoxicillin> 11/12 Started on peripheral Levophed for persistent hypotension, albumin 11/13 Troponin rising from 263 to 771, DVT/PE ruled out 11/14 dobutamine started for low coox, lower EF on echo 11/15 trial of restarting heparin 11/17 off dobutamine 11/19 coox 93, remains on pressors 11/20 Coox 67, CVP 8, on 3 norepi  Interim History / Subjective:  Afebrile  On 4 mcg norepi Pt reports feeling ok, feels better after a shave I/O 2.6L UOP,  -1.9L in last 24h  Objective   Blood pressure (!) 91/55, pulse 83, temperature 97.8 F (36.6 C), temperature source Oral, resp. rate 11, height '6\' 4"'$  (1.93 m), weight 100 kg, SpO2 98 %. CVP:  [4 mmHg-6 mmHg] 5 mmHg      Intake/Output Summary (Last 24 hours) at 09/10/2022 1255 Last data filed at 09/10/2022 1200 Gross per 24 hour  Intake 754.72 ml  Output 2093 ml  Net -1338.28 ml   Filed Weights   09/08/22 0500 09/09/22 0500 09/10/22 0500  Weight: 94.6 kg 100.1 kg 100 kg   Examination: General: adult male lying in bed in NAD PSY: flat affect, keeps eyes closed during most of conversation  HEENT: MM pink/moist, anicteric Neuro: AAOx4, speech clear, MAE  CV: s1s2 RRR, no m/r/g PULM: non-labored at rest, lungs bilaterally clear  GI: soft, bsx4 active  Extremities: warm/dry, trace ankle edema, L BKA  Skin: no rashes or lesions   Resolved Hospital Problem list   Hypophosphatemia NAGMA  Assessment & Plan:   Shock, initially suspected septic from UTI, now looking more likely cardiogenic shock. All cultures have remained negative this admission and he has been on antibiotics for weeks pre-dating this admit. Acute drop in EF, low coox. Acute on chronic HFrEF, ICM, CAD with stent in 05/2022 > 09/03/22 TTE> EF now 30-35, G2DD  Lactic acidosis, secondary to above -add stress dose steroids given prolonged duration of vasopressors, 100 mg Q8 hydrocort -continue midodrine 10 mg TID -follow daily Co-Ox  -appreciate Cardiology evaluation > not a candidate for revascularization, home inotropes, transplant or LVAD.  Unable to give GDMT due to ongoing hypotension -tele monitoring   -continue levophed for MAP >65   UTI - ESBL E Coli and CRE Proteus in the past (diagnosed at [facility]) Prostatitis Scrotal cellulitis -continue ertapenem per ID, plan for 4-6 weeks treatment for prostatitis  -appreciate ID  -plan for him to follow up in [facility] with ID as has established care there  -keep  foley in place until seen by Urology for discharge   Hematuria due to UTI Known prostate cancer, under surveillance with urology -continue eliquis, plavix  -trend H/H  -will need cystoscopy as outpatient, keep foley until then.  Previously ok to attempt voiding trial per Urology if pt ambulatory  -continue flomax, finasteride   DM2 -CBG range 123-243  -levemir 22 units BID  -meal coverage TID, 6 units  -PRN SSI   Hyponatremia, hypervolemic Anasarca -diuresis per Cardiology   pAF NSR -continue eliquis   Deconditioning -PT / OT  -use prosthesis, mobilize as able  Chronic Stage 2 sacral ulcer -wound care per protocol   Insomnia, phantom limb pain LLE -continue QHS gabapentin  -melatonin, trazodone, ambien QHS -palliative care input appreciated  Best Practice (right click and "Reselect all SmartList Selections" daily)  Diet/type: Regular consistency > carb modified/ heart healthy DVT prophylaxis: Eliquis GI prophylaxis: PPI   Lines: PICC Foley: yes and it is still needed Code Status:  DNR   Critical care time: 88 minutes    Noe Gens, MSN, APRN, NP-C, AGACNP-BC [facility] 09/10/2022, 1:49 PM   Please see [email or web address] for pager details.   From 7A-7P if no response, please call [phone] After hours, please call ELink [phone]

## 2022-09-10 NOTE — Progress Notes (Signed)
Rounding Note    Patient Name: Peter Yang Date of Encounter: 09/10/2022  [place] Cardiologist:   Dr Blenda Bridegroom, [facility], [place] [facility] Dr Debara Pickett  Subjective   Quite frustrated currently.  Still having challenges with hypotension.  No chest pain.  No shortness of breath.  Inpatient Medications    Scheduled Meds:  apixaban  5 mg Oral BID   atorvastatin  80 mg Oral Daily   Chlorhexidine Gluconate Cloth  6 each Topical Daily   clopidogrel  75 mg Oral Daily   finasteride  5 mg Oral Daily   gabapentin  600 mg Oral QHS   hydrocortisone sod succinate (SOLU-CORTEF) inj  100 mg Intravenous Q8H   insulin aspart  0-15 Units Subcutaneous TID WC   insulin aspart  0-5 Units Subcutaneous QHS   insulin aspart  6 Units Subcutaneous TID WC   insulin detemir  22 Units Subcutaneous BID   lidocaine  1 patch Transdermal Daily   melatonin  5 mg Oral QHS   midodrine  10 mg Oral TID with meals   pantoprazole  40 mg Oral Daily   sodium chloride flush  10-40 mL Intracatheter Q12H   tamsulosin  0.4 mg Oral Daily   traZODone  50 mg Oral QHS   Continuous Infusions:  sodium chloride Stopped (09/04/22 0558)   sodium chloride 10 mL/hr at 09/09/22 1825   ertapenem Stopped (09/09/22 1736)   norepinephrine (LEVOPHED) Adult infusion 4 mcg/min (09/10/22 1500)   PRN Meds: sodium chloride, acetaminophen **OR** acetaminophen, mouth rinse, polyvinyl alcohol, sodium chloride flush, zolpidem   Vital Signs    CVP 9-11 overnight, now 10 Vitals:   09/10/22 1430 09/10/22 1445 09/10/22 1500 09/10/22 1515  BP: (!) 94/59 (!) 88/48 (!) 91/42 (!) 87/59  Pulse:   92   Resp:   (!) 23   Temp:      TempSrc:      SpO2:   98%   Weight:      Height:        Intake/Output Summary (Last 24 hours) at 09/10/2022 1537 Last data filed at 09/10/2022 1500 Gross per 24 hour  Intake 777.11 ml  Output 1859 ml  Net -1081.89 ml      09/10/2022    5:00 AM 09/09/2022    5:00 AM 09/08/2022     5:00 AM  Last 3 Weights  Weight (lbs) 220 lb 7.4 oz 220 lb 10.9 oz 208 lb 8.9 oz  Weight (kg) 100 kg 100.1 kg 94.6 kg      Telemetry    SR, PVCs noted- Personally Reviewed  ECG    None today- Personally Reviewed  Physical Exam   GEN: Chronically ill-appearing male, no acute distress.  No significant change. Neck: no JVD Cardiac: RRR, no murmurs, rubs, or gallops.  Respiratory: Normal respiratory effort GI: Soft, nontender, non-distended  MS: No edema; No new deformity. S/p L BKA Neuro:  Nonfocal  Psych: Normal affect   Labs       Latest Ref Rng & Units 09/06/2022    5:00 AM 09/07/2022    4:58 AM 09/08/2022    4:01 AM 09/09/2022    4:15 AM 09/10/2022    5:21 AM  Total Hgb  Total Hgb 12.0 - 16.0 g/dL 8.2  8.5  8.1  8.1  7.8       Latest Ref Rng & Units 09/06/2022    5:00 AM 09/07/2022    4:58 AM 09/08/2022    4:01  AM 09/09/2022    4:15 AM 09/10/2022    5:21 AM  O2 Sat  O2 Sat % 63.4  66.6  93  67.2  75.1       Latest Ref Rng & Units 09/06/2022    5:00 AM 09/07/2022    4:58 AM 09/08/2022    4:01 AM 09/09/2022    4:15 AM 09/10/2022    5:21 AM  Carboxy Hgb  Carboxy Hgb 0.5 - 1.5 % 1.5  2.4  2.7  1.5  1.2       Latest Ref Rng & Units 09/06/2022    5:00 AM 09/07/2022    4:58 AM 09/08/2022    4:01 AM 09/09/2022    4:15 AM 09/10/2022    5:21 AM  Met Hgb  Met Hgb 0.0 - 1.5 % 0.9  <0.7  <0.7  0.7  <0.7       09/09/2022    4:00 PM 09/09/2022    8:00 PM 09/09/2022   11:28 PM 09/09/2022   11:52 PM 09/10/2022    3:54 AM  CVP  CVP (mmHg) 6 mmHg 4 mmHg 5 mmHg 5 mmHg 5 mmHg    High Sensitivity Troponin:   Recent Labs  Lab 09/01/22 0040 09/02/22 [place]  Lab 09/04/22 0604 09/05/22 0225 09/07/22 0458 09/08/22 0401 09/09/22 0415 09/09/22 0603 09/10/22 0521  NA 132*   < > 133* 131*  --  128* 130*  K 3.8   < > 4.2 4.2  --  4.1 4.2  CL 106   < > 100 101  --  98 99  CO2 20*   < > 27 26  --  27 24   GLUCOSE 238*   < > 192* 173*  --  356* 243*  BUN 27*   < > 26* 28*  --  25* 28*  CREATININE 0.90   < > 0.75 0.78  --  0.85 0.89  CALCIUM 7.8*   < > 8.1* 8.1*  --  8.1* 8.1*  MG 2.0   < > 1.7 2.0 2.0  --   --   PROT 5.4*  --   --   --   --   --   --   ALBUMIN 2.4*  --   --   --   --   --  2.5*  AST 69*  --   --   --   --   --   --   ALT 124*  --   --   --   --   --   --   ALKPHOS 106  --   --   --   --   --   --   BILITOT 0.6  --   --   --   --   --   --   GFRNONAA >60   < > >60 >60  --  >60 >60  ANIONGAP 6   < > 6 4*  --  3* 7   < > = values in this interval not displayed.    Lipids No results for input(s): "CHOL", "TRIG", "HDL", "LABVLDL", "LDLCALC", "CHOLHDL" in the last 168 hours.  Hematology Recent Labs  Lab 09/08/22 0401 09/09/22 0415 09/10/22 0521  WBC 7.7 6.7 7.7  RBC 3.04* 3.07* 2.94*  HGB 7.9* 7.7* 7.6*  HCT 25.7* 26.0* 24.8*  MCV 84.5 84.7 84.4  MCH 26.0 25.1* 25.9*  MCHC 30.7 29.6* 30.6  RDW 16.1* 16.3* 16.2*  PLT 196 221 223   Thyroid  Lab Results  Component Value Date   TSH 5.441 (H) 08/03/2022     BNP Recent Labs  Lab 09/06/22 0500  BNP 2,623.1*    DDimer  No results for input(s): "DDIMER" in the last 168 hours.    Radiology    No results found.  Cardiac Studies   Echo 09/03/22  1. Left ventricular ejection fraction, by estimation, is 30 to 35%. The left ventricle has moderately decreased function. The left ventricle demonstrates regional wall motion abnormalities (see scoring diagram/findings for description). The left ventricular internal cavity size was mildly dilated. Left ventricular diastolic parameters are consistent with Grade II diastolic dysfunction (pseudonormalization).   2. Right ventricular systolic function is moderately reduced. The right ventricular size is mildly enlarged. There is mildly elevated pulmonary artery systolic pressure. The estimated right ventricular systolic pressure is 18.8 mmHg.   3. Left atrial size was  moderately dilated.   4. Right atrial size was mildly dilated.   5. The mitral valve is abnormal. Moderate mitral valve regurgitation. No evidence of mitral stenosis.   6. The aortic valve is tricuspid. There is mild calcification of the aortic valve. Aortic valve regurgitation is not visualized. Aortic valve sclerosis is present, with no evidence of aortic valve stenosis.   7. Aortic dilatation noted. There is mild dilatation of the aortic root, measuring 42 mm.   8. The inferior vena cava is normal in size with <50% respiratory variability, suggesting right atrial pressure of 8 mmHg.   Comparison(s): Changes from prior study are noted. EF significantly reduced compared to prior, now with more prominent anterior wall motion abnormalities. Findings communicated to PCCM team.   Conclusion(s)/Recommendation(s): No left ventricular mural or apical  thrombus/thrombi.   Patient Profile     66 y.o. male with a hx of nonrevascularizable CAD, ascending TAA (not seen on CT 07/2022, 29m aortic root by echo 11/14), TIA, PAF found during 05/2022 admission. DM2 with polyneuropathy and diabetic ulcer, L BKA, HTN, HLD, chronic HFrEF, BPH, UTIs, bowel perforation, prsostate CA, OSA, prior retroperitoneal abscess who was admitted 11/11 with ongoing gross hematuria, shock req multiple pressors (midodrine at home), recent COVID, ESBL UTI, lactic acidosis, AKI, scrotal cellulitis, DNR.   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  - CVP 11 -  On levophed down to 4 mcg/min, off dobutamine and on home dose midodrine 10 mg tid. Challenging to wean Norepi. Stress dose steroids given today.  -  2600 cc autodiuresis yesterday - not currently on a diuretic  2. Hx PAF and CAD w/ PCI RCA 04/23, multiple occluded vessels, not amenable to PCI -back on eliquis  -No ischemic symptoms -back on plavix, no active signs of  bleeding.   3. Prostatitis, deconditioning, social and other issues. Difficult situation with failure to thrive  Note patient is DNR   He is not a candidate for home inotropes, transplant, LVAD.  CRITICAL CARE Performed by: MCandee Furbish  Total critical care time: 35 minutes  Critical care time was exclusive of separately billable procedures and treating other patients.  Critical care was necessary to treat or prevent imminent or life-threatening deterioration.  Critical care was time spent personally by me on the following activities: development of treatment plan with patient and/or surrogate as well as nursing, discussions with  consultants, evaluation of patient's response to treatment, examination of patient, obtaining history from patient or surrogate, ordering and performing treatments and interventions, ordering and review of laboratory studies, ordering and review of radiographic studies, pulse oximetry and re-evaluation of patient's condition.   For questions or updates, please contact [place] Please consult [email or web address] for contact info under     Signed, Candee Furbish, MD  09/10/2022, 3:37 PM

## 2022-09-10 NOTE — Progress Notes (Signed)
Physical Therapy Treatment Patient Details Name: Peter Yang MRN: [id number] DOB: Jan 23, 1956 Today's Date: 09/10/2022   History of Present Illness Pt is a 66 year old man admitted from SNF with gross hematuria, UTI and sepsis on 11/11. PMH: CAD, NSTEMI, AAA, TIA, PAF, DM, diabetic neuropathy, L BKA, HTN, HLD, CHF with EF of 40-45%, recent admissions with sepsis, UTI, hypoglycemia and COVID.    PT Comments    Patient  agreeable to sitting on the bed edge, required no assistance. BP stable/not dropping with upright position. Patient  not interested in transfer to recliner. Continue PT as tolerated.    Recommendations for follow up therapy are one component of a multi-disciplinary discharge planning process, led by the attending physician.  Recommendations may be updated based on patient status, additional functional criteria and insurance authorization.  Follow Up Recommendations  Skilled nursing-short term rehab (<3 hours/day) Can patient physically be transported by private vehicle: No   Assistance Recommended at Discharge Frequent or constant Supervision/Assistance  Patient can return home with the following Two people to help with walking and/or transfers;Help with stairs or ramp for entrance;A little help with bathing/dressing/bathroom;Assistance with cooking/housework;Assist for transportation   Equipment Recommendations  None recommended by PT    Recommendations for Other Services       Precautions / Restrictions Precautions Precautions: Fall Precaution Comments: L BKA with prosthesis in room, monitor BP Restrictions Weight Bearing Restrictions: No     Mobility  Bed Mobility   Bed Mobility: Supine to Sit, Sit to Supine     Supine to sit: Supervision Sit to supine: Supervision   General bed mobility comments: Increased time with assist for lines only    Transfers                   General transfer comment: pt only agreeable to sitting EOB     Ambulation/Gait                   Stairs             Wheelchair Mobility    Modified Rankin (Stroke Patients Only)       Balance Overall balance assessment: Needs assistance Sitting-balance support: No upper extremity supported, Feet supported Sitting balance-Leahy Scale: Good                                      Cognition Arousal/Alertness: Awake/alert Behavior During Therapy: WFL for tasks assessed/performed, Flat affect Overall Cognitive Status: No family/caregiver present to determine baseline cognitive functioning                                 General Comments: patient was noted to be self limited for activity during session.        Exercises      General Comments General comments (skin integrity, edema, etc.): patietns blood pressure was 91/61 mmhg (68 MAP)100% RA sitting EOB.  After few mins EOB 105/71 (MAP 80). at end of session patients blood pressure was 112/72 mmhg and MAP of 86      Pertinent Vitals/Pain Pain Assessment Faces Pain Scale: Hurts a little bit Pain Location: foley Pain Descriptors / Indicators: Discomfort, Guarding Pain Intervention(s): Monitored during session    Home Living  Prior Function            PT Goals (current goals can now be found in the care plan section) Progress towards PT goals: Progressing toward goals    Frequency    Min 2X/week      PT Plan Current plan remains appropriate    Co-evaluation PT/OT/SLP Co-Evaluation/Treatment: Yes Reason for Co-Treatment: For patient/therapist safety;To address functional/ADL transfers PT goals addressed during session: Mobility/safety with mobility OT goals addressed during session: ADL's and self-care      AM-PAC PT "6 Clicks" Mobility   Outcome Measure  Help needed turning from your back to your side while in a flat bed without using bedrails?: None Help needed moving from lying on  your back to sitting on the side of a flat bed without using bedrails?: None Help needed moving to and from a bed to a chair (including a wheelchair)?: None Help needed standing up from a chair using your arms (e.g., wheelchair or bedside chair)?: Total Help needed to walk in hospital room?: Total Help needed climbing 3-5 steps with a railing? : Total 6 Click Score: 15    End of Session   Activity Tolerance: Patient tolerated treatment well Patient left: in bed (with OT present) Nurse Communication: Mobility status PT Visit Diagnosis: Other abnormalities of gait and mobility (R26.89);Muscle weakness (generalized) (M62.81)     Time: 8315-1761 PT Time Calculation (min) (ACUTE ONLY): 11 min  Charges:     None                   Tresa Endo [place] Office [phone] Weekend [phone]    Claretha Cooper 09/10/2022, 3:19 PM

## 2022-09-11 DIAGNOSIS — Z515 Encounter for palliative care: Secondary | ICD-10-CM | POA: Diagnosis not present

## 2022-09-11 DIAGNOSIS — R310 Gross hematuria: Secondary | ICD-10-CM | POA: Diagnosis not present

## 2022-09-11 DIAGNOSIS — Z79899 Other long term (current) drug therapy: Secondary | ICD-10-CM

## 2022-09-11 DIAGNOSIS — R570 Cardiogenic shock: Secondary | ICD-10-CM | POA: Diagnosis not present

## 2022-09-11 DIAGNOSIS — R4586 Emotional lability: Secondary | ICD-10-CM

## 2022-09-11 DIAGNOSIS — I5023 Acute on chronic systolic (congestive) heart failure: Secondary | ICD-10-CM | POA: Diagnosis not present

## 2022-09-11 DIAGNOSIS — N410 Acute prostatitis: Secondary | ICD-10-CM | POA: Diagnosis not present

## 2022-09-11 LAB — HEPATIC FUNCTION PANEL
ALT: 23 U/L (ref 0–44)
AST: 17 U/L (ref 15–41)
Albumin: 2.6 g/dL — ABNORMAL LOW (ref 3.5–5.0)
Alkaline Phosphatase: 78 U/L (ref 38–126)
Bilirubin, Direct: 0.1 mg/dL (ref 0.0–0.2)
Indirect Bilirubin: 0.6 mg/dL (ref 0.3–0.9)
Total Bilirubin: 0.7 mg/dL (ref 0.3–1.2)
Total Protein: 5.5 g/dL — ABNORMAL LOW (ref 6.5–8.1)

## 2022-09-11 LAB — COOXEMETRY PANEL
Carboxyhemoglobin: 1.5 % (ref 0.5–1.5)
Carboxyhemoglobin: 1.5 % (ref 0.5–1.5)
Methemoglobin: 1.2 % (ref 0.0–1.5)
Methemoglobin: 1 % (ref 0.0–1.5)
O2 Saturation: 58.8 %
O2 Saturation: 57.1 %
Total hemoglobin: 7.9 g/dL — ABNORMAL LOW (ref 12.0–16.0)
Total hemoglobin: 8.1 g/dL — ABNORMAL LOW (ref 12.0–16.0)

## 2022-09-11 LAB — BASIC METABOLIC PANEL
Anion gap: 8 (ref 5–15)
BUN: 30 mg/dL — ABNORMAL HIGH (ref 8–23)
CO2: 24 mmol/L (ref 22–32)
Calcium: 8.5 mg/dL — ABNORMAL LOW (ref 8.9–10.3)
Chloride: 101 mmol/L (ref 98–111)
Creatinine, Ser: 0.87 mg/dL (ref 0.61–1.24)
GFR, Estimated: >60 mL/min (ref >60)
Glucose, Bld: 268 mg/dL — ABNORMAL HIGH (ref 70–99)
Potassium: 4.1 mmol/L (ref 3.5–5.1)
Sodium: 133 mmol/L — ABNORMAL LOW (ref 135–145)

## 2022-09-11 LAB — CBC
HCT: 25.1 % — ABNORMAL LOW (ref 39.0–52.0)
Hemoglobin: 7.6 g/dL — ABNORMAL LOW (ref 13.0–17.0)
MCH: 25.3 pg — ABNORMAL LOW (ref 26.0–34.0)
MCHC: 30.3 g/dL (ref 30.0–36.0)
MCV: 83.7 fL (ref 80.0–100.0)
Platelets: 207 10*3/uL (ref 150–400)
RBC: 3 MIL/uL — ABNORMAL LOW (ref 4.22–5.81)
RDW: 16.3 % — ABNORMAL HIGH (ref 11.5–15.5)
WBC: 5.1 10*3/uL (ref 4.0–10.5)
nRBC: 0 % (ref 0.0–0.2)

## 2022-09-11 LAB — GLUCOSE, CAPILLARY
Glucose-Capillary: 259 mg/dL — ABNORMAL HIGH (ref 70–99)
Glucose-Capillary: 218 mg/dL — ABNORMAL HIGH (ref 70–99)
Glucose-Capillary: 237 mg/dL — ABNORMAL HIGH (ref 70–99)
Glucose-Capillary: 258 mg/dL — ABNORMAL HIGH (ref 70–99)

## 2022-09-11 LAB — LACTIC ACID, PLASMA: Lactic Acid, Venous: 2 mmol/L (ref 0.5–1.9)

## 2022-09-11 MED ORDER — DICLOFENAC SODIUM 1 % EX GEL
2.0000 g | Freq: Three times a day (TID) | CUTANEOUS | Status: DC | PRN
  Administered 2022-09-12 – 2022-10-16 (×3): 2 g via TOPICAL
  Filled 2022-09-11: qty 100

## 2022-09-11 MED ORDER — INSULIN ASPART 100 UNIT/ML IJ SOLN
8.0000 [IU] | Freq: Three times a day (TID) | INTRAMUSCULAR | Status: DC
  Administered 2022-09-11 – 2022-09-16 (×18): 8 [IU] via SUBCUTANEOUS

## 2022-09-11 MED ORDER — MIDODRINE HCL 5 MG PO TABS
15.0000 mg | ORAL_TABLET | Freq: Three times a day (TID) | ORAL | Status: DC
  Administered 2022-09-11 – 2022-09-16 (×15): 15 mg via ORAL
  Filled 2022-09-11 (×15): qty 3

## 2022-09-11 MED ORDER — INSULIN DETEMIR 100 UNIT/ML ~~LOC~~ SOLN
26.0000 [IU] | Freq: Two times a day (BID) | SUBCUTANEOUS | Status: DC
  Administered 2022-09-11 – 2022-09-17 (×13): 26 [IU] via SUBCUTANEOUS
  Filled 2022-09-11 (×15): qty 0.26

## 2022-09-11 NOTE — Progress Notes (Signed)
Daily Progress Note   Patient Name: Peter Yang       Date: 09/11/2022 DOB: 07-12-1956  Age: 65 y.o. MRN#: [id number] Attending Physician: Margaretha Seeds, MD Primary Care Physician: Clovia Cuff, MD Admit Date: 08/31/2022 Length of Stay: 10 days  Reason for Consultation/Follow-up: Establishing goals of care  Subjective:   CC: Patient resting comfortably and wanting to be allowed to sleep. Following up regarding complex medical decision making.   Subjective:  Reviewed EMR prior to presenting to bedside. Discussed care with bedside RN for updates. Patient still appearing withdrawn and minimally interactive with others. Remains on levophed for hypotension support.   Presented to bedside to check on patient. Patient resting comfortably in bed and wanting to be allowed to continue sleeping. No expressions of pain noted. Acknowledged and respected patient's wishes. Noted PMT would continue following.   Review of Systems Denies any symptoms of concern at this time.  Objective:   Vital Signs:  BP 97/66   Pulse 88   Temp 98.2 F (36.8 C) (Oral)   Resp 18   Ht '6\' 4"'$  (1.93 m)   Wt 99 kg   SpO2 96%   BMI 26.57 kg/m   Physical Exam: General: resting comfortably, chronically ill appearing  Eyes: No drainage noted HENT: moist mucous membranes Cardiovascular: RRR Respiratory: no increased work of breathing noted, not in respiratory distress Extremities: L BKA Skin: no rashes or lesions on visible skin Psych: Withdrawn  Imaging: I personally reviewed recent imaging.   Assessment & Plan:   Assessment: 66 year old man, from [place], admitted to the hospital 08/31/2022 because of hematuria. He has a past medical history of CAD/NSTEMI, AAA, TIA, PAF on Eliquis, diabetes, hypertension, heart failure with reduced ejection fraction left ventricular ejection fraction 40-45%, history of recent COVID. Patient admitted to hospital medicine service because of a  complicated UTI and hematuria, cultures positive for ESBL, antibiotics adjusted, urology also consulted for scrotal erythema and cellulitis, remains on antibiotics, hospital course complicated by ongoing hypotension requiring peripheral Levophed and Dobutamine support. Patient also on intravenous heparin infusion. PMT consulted for goals of care discussions.   Recommendations/Plan: # Complex medical decision making/goals of care:  -Continue to attempt to support further discussions with patient at this time.  Patient seems withdrawn and potentially depressed. PMT to continue following to support patient.  - As per EMR review, DNR as per previous discussions with the patient. As per cardiology, not candidate for home inotrope, transplant or LVAD. No next of kin, no family, no HCPOA, does not want friends contacted. Came from facility, would recommend palliative or even hospice support once patient stabilizes to the point of being considered for discharge. Monitor hospital course for now  -  Code Status: DNR  # Symptom management:  Pain   -Patient is on Tylenol and Neurontin, lidocaine patch.    Mood   Patient appears withdrawn and potentially depressed. Unable to complete PHQ-9 during this visit. May be worth discussing with patient if he is open to it potentially starting an antidepressant. Already on trazodone qhs though may want to consider SSRI with potential for orthostatic hypotension associated with trazodone. Could consider addition morning dose of escitalopram.   # Psychosocial Support:  -No next of kin, no family, no HCPOA, does not want friends contacted.   # Discharge Planning: Likely return to facility  Discussed with: bedside RN, patient, CCM NP  Thank you for allowing the palliative care team to participate in the care [name].  Chelsea Aus, DO Palliative Care Provider PMT # [phone]

## 2022-09-11 NOTE — Inpatient Diabetes Management (Signed)
Inpatient Diabetes Program Recommendations  AACE/ADA: New Consensus Statement on Inpatient Glycemic Control (2015)  Target Ranges:  Prepandial:   less than 140 mg/dL      Peak postprandial:   less than 180 mg/dL (1-2 hours)      Critically ill patients:  140 - 180 mg/dL   Lab Results  Component Value Date   GLUCAP 259 (H) 09/11/2022   HGBA1C 6.6 (H) 08/03/2022    Review of Glycemic Control  Outpatient Diabetes medications: Jardiance 10 mg QD, Metformin 1000 mg BID Current orders for Inpatient glycemic control: Levemir 26 units BID, Novolog 8 units TID, Novolog 0-15 units TID & HS, Solucortef 100 mg Q8H  Inpatient Diabetes Program Recommendations:    Noted adjustments with Levermir and Novolog meal coverage.  If glucose remains elevated, might consider:  Novolog 0-20 units TID and HS while receiving steroids.    Will continue to follow while inpatient.  Thank you, Reche Dixon, MSN, [place] [facility] Coordinator Inpatient Diabetes Program [phone] (team pager from 8a-5p)

## 2022-09-11 NOTE — Progress Notes (Signed)
Rounding Note    Patient Name: Peter Yang Date of Encounter: 09/11/2022  [place] Cardiologist:   Dr Blenda Bridegroom, [facility], [facility] Dr Debara Pickett  Subjective   Frustration.   Inpatient Medications    Scheduled Meds:  apixaban  5 mg Oral BID   atorvastatin  80 mg Oral Daily   Chlorhexidine Gluconate Cloth  6 each Topical Daily   clopidogrel  75 mg Oral Daily   finasteride  5 mg Oral Daily   gabapentin  600 mg Oral QHS   hydrocortisone sod succinate (SOLU-CORTEF) inj  100 mg Intravenous Q8H   insulin aspart  0-15 Units Subcutaneous TID WC   insulin aspart  0-5 Units Subcutaneous QHS   insulin aspart  8 Units Subcutaneous TID WC   insulin detemir  26 Units Subcutaneous BID   lidocaine  1 patch Transdermal Daily   melatonin  5 mg Oral QHS   midodrine  15 mg Oral TID with meals   nystatin   Topical BID   pantoprazole  40 mg Oral Daily   sodium chloride flush  10-40 mL Intracatheter Q12H   tamsulosin  0.4 mg Oral Daily   traZODone  50 mg Oral QHS   Continuous Infusions:  sodium chloride Stopped (09/04/22 0558)   sodium chloride 10 mL/hr at 09/11/22 0431   ertapenem Stopped (09/10/22 1750)   norepinephrine (LEVOPHED) Adult infusion 4 mcg/min (09/11/22 1100)   PRN Meds: sodium chloride, acetaminophen **OR** acetaminophen, mouth rinse, polyvinyl alcohol, sodium chloride flush, zolpidem   Vital Signs    CVP 9-11 overnight, now 10 Vitals:   09/11/22 1100 09/11/22 1115 09/11/22 1130 09/11/22 1145  BP: 101/64 101/66 100/61 103/70  Pulse: 86     Resp: 12     Temp:      TempSrc:      SpO2: 97%     Weight:      Height:        Intake/Output Summary (Last 24 hours) at 09/11/2022 1212 Last data filed at 09/11/2022 1100 Gross per 24 hour  Intake 516.91 ml  Output 1800 ml  Net -1283.09 ml      09/11/2022    5:00 AM 09/10/2022    5:00 AM 09/09/2022    5:00 AM  Last 3 Weights  Weight (lbs) 218 lb 4.1 oz 220 lb 7.4 oz 220 lb 10.9 oz   Weight (kg) 99 kg 100 kg 100.1 kg      Telemetry    SR, PVCs noted- Personally Reviewed  ECG    None today- Personally Reviewed  Physical Exam   GEN: Chronically ill-appearing male, no acute distress.  No significant change today. Neck: no JVD Cardiac: RRR, no murmurs, rubs, or gallops.  Respiratory: Normal respiratory effort GI: Soft, nontender, non-distended  MS: No edema; No new deformity. S/p L BKA Neuro:  Nonfocal  Psych: Normal affect   Labs       Latest Ref Rng & Units 09/07/2022    4:58 AM 09/08/2022    4:01 AM 09/09/2022    4:15 AM 09/10/2022    5:21 AM 09/11/2022    4:45 AM  Total Hgb  Total Hgb 12.0 - 16.0 g/dL 8.5  8.1  8.1  7.8  7.9       Latest Ref Rng & Units 09/07/2022    4:58 AM 09/08/2022    4:01 AM 09/09/2022    4:15 AM 09/10/2022    5:21 AM 09/11/2022    4:45 AM  O2 Sat  O2 Sat % 66.6  93  67.2  75.1  58.8       Latest Ref Rng & Units 09/07/2022    4:58 AM 09/08/2022    4:01 AM 09/09/2022    4:15 AM 09/10/2022    5:21 AM 09/11/2022    4:45 AM  Carboxy Hgb  Carboxy Hgb 0.5 - 1.5 % 2.4  2.7  1.5  1.2  1.5       Latest Ref Rng & Units 09/07/2022    4:58 AM 09/08/2022    4:01 AM 09/09/2022    4:15 AM 09/10/2022    5:21 AM 09/11/2022    4:45 AM  Met Hgb  Met Hgb 0.0 - 1.5 % <0.7  <0.7  0.7  <0.7  1.2       09/10/2022    8:00 PM 09/10/2022   10:00 PM 09/11/2022   12:00 AM 09/11/2022    4:00 AM 09/11/2022    8:54 AM  CVP  CVP (mmHg) 4 mmHg 0 mmHg 4 mmHg 6 mmHg 7 mmHg    High Sensitivity Troponin:   Recent Labs  Lab 09/01/22 0040 09/02/22 [place]*     Chemistry Recent Labs  Lab 09/07/22 0458 09/08/22 0401 09/09/22 0415 09/09/22 0603 09/10/22 0521 09/11/22 0445  NA 133* 131*  --  128* 130* 133*  K 4.2 4.2  --  4.1 4.2 4.1  CL 100 101  --  98 99 101  CO2 27 26  --  _0 GLUCOSE 192* 173*  --  356* 243* 268*  BUN 26* 28*  --  25* 28* 30*  CREATININE 0.75 0.78  --  0.85 0.89 0.87   CALCIUM 8.1* 8.1*  --  8.1* 8.1* 8.5*  MG 1.7 2.0 2.0  --   --   --   ALBUMIN  --   --   --   --  2.5*  --   GFRNONAA >60 >60  --  >60 >60 >60  ANIONGAP 6 4*  --  3* 7 8    Lipids No results for input(s): "CHOL", "TRIG", "HDL", "LABVLDL", "LDLCALC", "CHOLHDL" in the last 168 hours.  Hematology Recent Labs  Lab 09/09/22 0415 09/10/22 0521 09/11/22 0445  WBC 6.7 7.7 5.1  RBC 3.07* 2.94* 3.00*  HGB 7.7* 7.6* 7.6*  HCT 26.0* 24.8* 25.1*  MCV 84.7 84.4 83.7  MCH 25.1* 25.9* 25.3*  MCHC 29.6* 30.6 30.3  RDW 16.3* 16.2* 16.3*  PLT 221 223 207   Thyroid  Lab Results  Component Value Date   TSH 5.441 (H) 08/03/2022     BNP Recent Labs  Lab 09/06/22 0500  BNP 2,623.1*    DDimer  No results for input(s): "DDIMER" in the last 168 hours.    Radiology    No results found.  Cardiac Studies   Echo 09/03/22  1. Left ventricular ejection fraction, by estimation, is 30 to 35%. The left ventricle has moderately decreased function. The left ventricle demonstrates regional wall motion abnormalities (see scoring diagram/findings for description). The left ventricular internal cavity size was mildly dilated. Left ventricular diastolic parameters are consistent with Grade II diastolic dysfunction (pseudonormalization).   2. Right ventricular systolic function is moderately reduced. The right ventricular size is mildly enlarged. There is mildly elevated pulmonary artery systolic pressure. The estimated right ventricular systolic pressure is 58.3 mmHg.   3. Left atrial size was moderately dilated.   4. Right atrial size was mildly  dilated.   5. The mitral valve is abnormal. Moderate mitral valve regurgitation. No evidence of mitral stenosis.   6. The aortic valve is tricuspid. There is mild calcification of the aortic valve. Aortic valve regurgitation is not visualized. Aortic valve sclerosis is present, with no evidence of aortic valve stenosis.   7. Aortic dilatation noted. There is mild  dilatation of the aortic root, measuring 42 mm.   8. The inferior vena cava is normal in size with <50% respiratory variability, suggesting right atrial pressure of 8 mmHg.   Comparison(s): Changes from prior study are noted. EF significantly reduced compared to prior, now with more prominent anterior wall motion abnormalities. Findings communicated to PCCM team.   Conclusion(s)/Recommendation(s): No left ventricular mural or apical  thrombus/thrombi.   Patient Profile     66 y.o. male with a hx of nonrevascularizable CAD, ascending TAA (not seen on CT 07/2022, 76m aortic root by echo 11/14), TIA, PAF found during 05/2022 admission. DM2 with polyneuropathy and diabetic ulcer, L BKA, HTN, HLD, chronic HFrEF, BPH, UTIs, bowel perforation, prsostate CA, OSA, prior retroperitoneal abscess who was admitted 11/11 with ongoing gross hematuria, shock req multiple pressors (midodrine at home), recent COVID, ESBL UTI, lactic acidosis, AKI, scrotal cellulitis, DNR.   Assessment & Plan    Acute on chronic CHF, CGS is part of his shock profile however CO now appears much improved base upon Coox.  - EF now 30%  - Coox 58 from 75 from 67.2% today --very reasonable estimated CO.  - CVP 11 - On levophed down to 4 mcg/min, off dobutamine and on home dose midodrine 10 mg tid. Challenging to wean Norepi. Stress dose steroids given started 11/21  - 1300 cc autodiuresis yesterday - not currently on a diuretic  2. Hx PAF and CAD w/ PCI RCA 04/23, multiple occluded vessels, not amenable to PCI -back on eliquis  -No ischemic symptoms -back on plavix, no active signs of bleeding.   3. Prostatitis, deconditioning, social and other issues. Difficult situation with failure to thrive  Note patient is DNR   He is not a candidate for home inotropes, transplant, LVAD.  Signed, MCandee Furbish MD  09/11/2022, 12:12 PM

## 2022-09-11 NOTE — Progress Notes (Signed)
NAME:  Peter Yang, MRN:  [id number], DOB:  [birth year], LOS: 35 ADMISSION DATE:  08/31/2022, CONSULTATION DATE:  11/11 REFERRING MD:  Lelon Mast FOR CONSULT: sepsis   History of Present Illness:  Peter Yang is an 66 y.o. M who presented to the [facility] ED with a chief complaint of hematuria.  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  Sent to the emergency department SNF staff on 11/11.  At the emergency department he developed hypotension with a BP of 83/63. WBC 14.2, lactate 5.0 > 7.9.  Code sepsis was called.  Blood cultures and urine cultures were obtained.  Patient was given fluid bolus per sepsis guidelines.  Patient with complicated infectious history-started on meropenem and amoxicillin by [facility].  Urology was consulted for hematuria and concern for forniers.  Recommends monitoring with possible cystoscopy once Eliquis washout. [facility] admitted patient.  PCCM was consulted for assistance with sepsis with high lactate.  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% BPH (hx prostate CA) SNF resident  Significant Hospital Events: Including procedures, antibiotic start and stop dates in addition to other pertinent events   11/11 Admit by [facility], BC>, UC>, PCCM consult, Mero and amoxicillin> 11/12 Started on peripheral Levophed for persistent hypotension, albumin 11/13 Troponin rising from 263 to 771, DVT/PE ruled out 11/14 dobutamine started for low coox, lower EF on echo 11/15 trial of restarting heparin 11/17 off dobutamine 11/19 coox 93, remains on pressors 11/20 Coox 67, CVP 8, on 3 norepi 11/21 Coox 75.1, 4 norepi   Interim History / Subjective:  This AM on 5 mcg/min levophed. COOX 58.8.   Objective   Blood  pressure 92/68, pulse 85, temperature 98.2 F (36.8 C), temperature source Oral, resp. rate 16, height '6\' 4"'$  (1.93 m), weight 99 kg, SpO2 97 %. CVP:  [0 mmHg-8 mmHg] 6 mmHg      Intake/Output Summary (Last 24 hours) at 09/11/2022 0820 Last data filed at 09/11/2022 0720 Gross per 24 hour  Intake 549.09 ml  Output 1800 ml  Net -1250.91 ml   Filed Weights   09/09/22 0500 09/10/22 0500 09/11/22 0500  Weight: 100.1 kg 100 kg 99 kg   Examination: General: adult male lying in bed no distress noted  PSY: flat affect.  HEENT: MM pink/moist, anicteric Neuro: alert, oriented, follows commands  CV: RRR, HR 85, no mRG  PULM: non-labored at rest, lungs bilaterally clear  GI: soft, bsx4 active  Extremities: warm/dry, trace ankle edema, L BKA  Skin: no rashes or lesions   Resolved Hospital Problem list   Hypophosphatemia NAGMA  Assessment & Plan:   Shock, initially suspected septic from UTI, now looking more likely cardiogenic shock. All cultures have remained negative this admission and he has been on antibiotics for weeks pre-dating this admit. Acute drop in EF, low coox. Acute on chronic HFrEF, ICM, CAD with stent in 05/2022 > 09/03/22 TTE> EF now 30-35, G2DD  pAF Hyponatremia, hypervolemic Anasarca Lactic acidosis, secondary to above Plan -appreciate Cardiology evaluation > not a candidate for revascularization, home inotropes, transplant or LVAD.  Unable to give GDMT due to ongoing hypotension -diuresis per Cardiology. Auto-diuresing at this point  -Cardiac Monitoring -Titrate Levophed for MAP goal >65  -Continue stress dose  steroids given prolonged duration of vasopressors, 100 mg Q8 hydrocort -continue midodrine 10 mg TID -follow daily Co-Ox. (SVO2 58.8 down frm 75.1 yesterday). Obtain CVP  -continue eliquis, plavix   Prostatitis Scrotal cellulitis H/O ESBL E.Coli and CRE Proteus Plan -appreciate ID  -continue ertapenem per ID, plan for 4-6 weeks treatment for prostatitis   -plan for him to follow up in [facility] with ID as has established care there  -keep foley in place until seen by Urology for discharge   Hematuria due to UTI Known prostate cancer, under surveillance with urology Plan -continue eliquis, plavix  -trend H/H  -will need cystoscopy as outpatient, keep foley until then.  Previously ok to attempt voiding trial per Urology if pt ambulatory  -continue flomax, finasteride   DM2 Plan -levemir 22 units BID >> increase to 26 given continued hyperglycemia with starting of steroids  -meal coverage TID, 6 units >> increase to 8 units  -PRN SSI   Deconditioning -PT / OT  -use prosthesis, mobilize as able  Chronic Stage 2 sacral ulcer -wound care per protocol   Insomnia, phantom limb pain LLE -continue QHS gabapentin  -melatonin, trazodone, ambien QHS -palliative care input appreciated  Best Practice (right click and "Reselect all SmartList Selections" daily)  Diet/type: Regular consistency > carb modified/ heart healthy DVT prophylaxis: Eliquis GI prophylaxis: PPI   Lines: PICC Foley: yes and it is still needed Code Status:  DNR   Critical care time: 35 minutes    CRITICAL CARE Performed by: Omar Person   Total critical care time: 35 minutes  Critical care time was exclusive of separately billable procedures and treating other patients.  Critical care was necessary to treat or prevent imminent or life-threatening deterioration.  Critical care was time spent personally by me on the following activities: development of treatment plan with patient and/or surrogate as well as nursing, discussions with consultants, evaluation of patient's response to treatment, examination of patient, obtaining history from patient or surrogate, ordering and performing treatments and interventions, ordering and review of laboratory studies, ordering and review of radiographic studies, pulse oximetry and re-evaluation of patient's  condition.  Peter Yang, AGACNP-BC [facility]  PCCM Pgr: [phone]

## 2022-09-12 DIAGNOSIS — R4589 Other symptoms and signs involving emotional state: Secondary | ICD-10-CM

## 2022-09-12 DIAGNOSIS — I5023 Acute on chronic systolic (congestive) heart failure: Secondary | ICD-10-CM | POA: Diagnosis not present

## 2022-09-12 DIAGNOSIS — N179 Acute kidney failure, unspecified: Secondary | ICD-10-CM | POA: Diagnosis not present

## 2022-09-12 DIAGNOSIS — R570 Cardiogenic shock: Secondary | ICD-10-CM | POA: Diagnosis not present

## 2022-09-12 DIAGNOSIS — Z515 Encounter for palliative care: Secondary | ICD-10-CM | POA: Diagnosis not present

## 2022-09-12 DIAGNOSIS — N410 Acute prostatitis: Secondary | ICD-10-CM | POA: Diagnosis not present

## 2022-09-12 DIAGNOSIS — R310 Gross hematuria: Secondary | ICD-10-CM | POA: Diagnosis not present

## 2022-09-12 LAB — BASIC METABOLIC PANEL
Anion gap: 9 (ref 5–15)
BUN: 43 mg/dL — ABNORMAL HIGH (ref 8–23)
CO2: 22 mmol/L (ref 22–32)
Calcium: 8.6 mg/dL — ABNORMAL LOW (ref 8.9–10.3)
Chloride: 99 mmol/L (ref 98–111)
Creatinine, Ser: 1.26 mg/dL — ABNORMAL HIGH (ref 0.61–1.24)
GFR, Estimated: >60 mL/min (ref >60)
Glucose, Bld: 262 mg/dL — ABNORMAL HIGH (ref 70–99)
Potassium: 4.5 mmol/L (ref 3.5–5.1)
Sodium: 130 mmol/L — ABNORMAL LOW (ref 135–145)

## 2022-09-12 LAB — COOXEMETRY PANEL
Carboxyhemoglobin: 2.3 % — ABNORMAL HIGH (ref 0.5–1.5)
Methemoglobin: 2.1 % — ABNORMAL HIGH (ref 0.0–1.5)
O2 Saturation: 53.2 %
Total hemoglobin: 8.4 g/dL — ABNORMAL LOW (ref 12.0–16.0)

## 2022-09-12 LAB — CBC
HCT: 25.2 % — ABNORMAL LOW (ref 39.0–52.0)
Hemoglobin: 7.6 g/dL — ABNORMAL LOW (ref 13.0–17.0)
MCH: 24.8 pg — ABNORMAL LOW (ref 26.0–34.0)
MCHC: 30.2 g/dL (ref 30.0–36.0)
MCV: 82.4 fL (ref 80.0–100.0)
Platelets: 249 10*3/uL (ref 150–400)
RBC: 3.06 MIL/uL — ABNORMAL LOW (ref 4.22–5.81)
RDW: 16.5 % — ABNORMAL HIGH (ref 11.5–15.5)
WBC: 8.7 10*3/uL (ref 4.0–10.5)
nRBC: 0 % (ref 0.0–0.2)

## 2022-09-12 LAB — GLUCOSE, CAPILLARY
Glucose-Capillary: 255 mg/dL — ABNORMAL HIGH (ref 70–99)
Glucose-Capillary: 278 mg/dL — ABNORMAL HIGH (ref 70–99)
Glucose-Capillary: 251 mg/dL — ABNORMAL HIGH (ref 70–99)
Glucose-Capillary: 222 mg/dL — ABNORMAL HIGH (ref 70–99)

## 2022-09-12 LAB — MAGNESIUM: Magnesium: 2 mg/dL (ref 1.7–2.4)

## 2022-09-12 LAB — PHOSPHORUS: Phosphorus: 4.3 mg/dL (ref 2.5–4.6)

## 2022-09-12 MED ORDER — LACTATED RINGERS IV BOLUS
500.0000 mL | Freq: Once | INTRAVENOUS | Status: AC
  Administered 2022-09-12: 500 mL via INTRAVENOUS

## 2022-09-12 MED ORDER — LACTATED RINGERS IV BOLUS
1000.0000 mL | Freq: Once | INTRAVENOUS | Status: AC
  Administered 2022-09-12: 1000 mL via INTRAVENOUS
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
## 2022-09-12 NOTE — Progress Notes (Signed)
NAME:  Peter Yang, MRN:  [id number], DOB:  1956/05/05, LOS: 31 ADMISSION DATE:  08/31/2022, CONSULTATION DATE:  11/11 REFERRING MD:  Lelon Mast FOR CONSULT: sepsis   History of Present Illness:  Peter Yang is an 66 y.o. M who presented to the [facility] ED with a chief complaint of hematuria.  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  Sent to the emergency department SNF staff on 11/11.  At the emergency department he developed hypotension with a BP of 83/63. WBC 14.2, lactate 5.0 > 7.9.  Code sepsis was called.  Blood cultures and urine cultures were obtained.  Patient was given fluid bolus per sepsis guidelines.  Patient with complicated infectious history-started on meropenem and amoxicillin by [facility].  Urology was consulted for hematuria and concern for forniers.  Recommends monitoring with possible cystoscopy once Eliquis washout. [facility] admitted patient.  PCCM was consulted for assistance with sepsis with high lactate.  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% BPH (hx prostate CA) SNF resident  Significant Hospital Events: Including procedures, antibiotic start and stop dates in addition to other pertinent events   11/11 Admit by [facility], BC>, UC>, PCCM consult, Mero and amoxicillin> 11/12 Started on peripheral Levophed for persistent hypotension, albumin 11/13 Troponin rising from 263 to 771, DVT/PE ruled out 11/14 dobutamine started for low coox, lower EF on echo 11/15 trial of restarting heparin 11/17 off dobutamine 11/19 coox 93, remains on pressors 11/20 Coox 67, CVP 8, on 3 norepi 11/21 Coox 75.1, 4 norepi   Interim History / Subjective:  Weaned to levophed 2 yesterday however overnight had SBP ranging in  80s so restarted. Has not received diuretics for >72 hours but has had robust UOP. Net neg 14L this admission  Objective   Blood pressure 117/71, pulse 82, temperature (!) 97.4 F (36.3 C), temperature source Oral, resp. rate 12, height '6\' 4"'$  (1.93 m), weight 102.1 kg, SpO2 100 %. CVP:  [7 mmHg-14 mmHg] 14 mmHg      Intake/Output Summary (Last 24 hours) at 09/12/2022 0847 Last data filed at 09/12/2022 0800 Gross per 24 hour  Intake 371.27 ml  Output 1130 ml  Net -758.73 ml   Filed Weights   09/10/22 0500 09/11/22 0500 09/12/22 0500  Weight: 100 kg 99 kg 102.1 kg   Physical Exam: General: Chronically ill-appearing, no acute distress HENT: [place], AT, OP clear, MMM Eyes: EOMI, no scleral icterus Respiratory: Clear to auscultation bilaterally.  No crackles, wheezing or rales Cardiovascular: RRR, -M/R/G, no JVD GI: BS+, soft, nontender Extremities: Left BKA, -Edema,-tenderness Neuro: AAO x4, CNII-XII grossly intact Psych: flat affect, withdrawn, does not open eyes with engagement but will on command GU: Foley in place  Resolved Hospital Problem list   Hypophosphatemia NAGMA  Assessment & Plan:   Shock, initially suspected septic from UTI, now looking more likely cardiogenic shock. All cultures have remained negative this admission and he has been on antibiotics for weeks pre-dating this admit. Acute drop in EF, low coox. Acute on chronic HFrEF, ICM, CAD with stent in 05/2022 > 09/03/22 TTE> EF now 30-35, G2DD  pAF Anasarca - improved Lactic acidosis, secondary to above Plan -appreciate Cardiology evaluation > not a candidate for revascularization, home inotropes, transplant  or LVAD.  Unable to give GDMT due to ongoing hypotension -Holding diuresis per Cardiology -LR bolus due to AKI due to auto-diuresis. Consider repeat bolus if responsive -Wean levophed for MAP goal >65  -Continue stress dose steroids given prolonged duration of vasopressors, 100 mg Q8 hydrocort -continue  midodrine 15 mg TID -follow daily Co-Ox  -continue eliquis, plavix   Prostatitis Scrotal cellulitis H/O ESBL E.Coli and CRE Proteus Plan -appreciate ID  -continue ertapenem per ID, plan for 4-6 weeks treatment for prostatitis  -plan for him to follow up in [facility] with ID as has established care there  -keep foley in place until seen by Urology for discharge   Hematuria due to UTI Known prostate cancer, under surveillance with urology Plan -continue eliquis, plavix  -trend H/H  -will need cystoscopy as outpatient, keep foley until then.  Previously ok to attempt voiding trial per Urology if pt ambulatory  -continue flomax, finasteride   DM2 Plan -levemir 22 units BID >> increase to 26 given continued hyperglycemia with starting of steroids  -meal coverage TID, 6 units >> increase to 8 units  -PRN SSI   Deconditioning -PT / OT  -use prosthesis, mobilize as able  Chronic Stage 2 sacral ulcer -wound care per protocol   Insomnia, phantom limb pain LLE -continue QHS gabapentin  -melatonin, trazodone, ambien QHS -palliative care input appreciated  Best Practice (right click and "Reselect all SmartList Selections" daily)  Diet/type: Regular consistency > carb modified/ heart healthy DVT prophylaxis: Eliquis GI prophylaxis: PPI   Lines: PICC Foley: yes and it is still needed Code Status:  DNR   Critical care time: 30 minutes    The patient is critically ill with multiple organ systems failure and requires high complexity decision making for assessment and support, frequent evaluation and titration of therapies, application of advanced monitoring technologies and extensive interpretation of multiple databases.    Rodman Pickle, M.D. [facility] Pulmonary/Critical Care Medicine 09/12/2022 8:47 AM   Please see Amion for pager number to reach on-call Pulmonary and Critical Care Team.

## 2022-09-12 NOTE — Progress Notes (Signed)
Nurse reached out to CCM to clarification on order. A fluid bolus order was placed but the rate is high for a patient with his noted EF. Will continue to monitor.

## 2022-09-13 DIAGNOSIS — R570 Cardiogenic shock: Secondary | ICD-10-CM | POA: Diagnosis not present

## 2022-09-13 DIAGNOSIS — Z515 Encounter for palliative care: Secondary | ICD-10-CM | POA: Diagnosis not present

## 2022-09-13 DIAGNOSIS — N179 Acute kidney failure, unspecified: Secondary | ICD-10-CM | POA: Diagnosis not present

## 2022-09-13 DIAGNOSIS — I5023 Acute on chronic systolic (congestive) heart failure: Secondary | ICD-10-CM | POA: Diagnosis not present

## 2022-09-13 DIAGNOSIS — N410 Acute prostatitis: Secondary | ICD-10-CM | POA: Diagnosis not present

## 2022-09-13 DIAGNOSIS — R310 Gross hematuria: Secondary | ICD-10-CM | POA: Diagnosis not present

## 2022-09-13 LAB — GLUCOSE, CAPILLARY
Glucose-Capillary: 264 mg/dL — ABNORMAL HIGH (ref 70–99)
Glucose-Capillary: 245 mg/dL — ABNORMAL HIGH (ref 70–99)
Glucose-Capillary: 213 mg/dL — ABNORMAL HIGH (ref 70–99)
Glucose-Capillary: 225 mg/dL — ABNORMAL HIGH (ref 70–99)

## 2022-09-13 LAB — BASIC METABOLIC PANEL
Anion gap: 6 (ref 5–15)
BUN: 46 mg/dL — ABNORMAL HIGH (ref 8–23)
CO2: 20 mmol/L — ABNORMAL LOW (ref 22–32)
Calcium: 7.7 mg/dL — ABNORMAL LOW (ref 8.9–10.3)
Chloride: 104 mmol/L (ref 98–111)
Creatinine, Ser: 1.14 mg/dL (ref 0.61–1.24)
GFR, Estimated: >60 mL/min (ref >60)
Glucose, Bld: 288 mg/dL — ABNORMAL HIGH (ref 70–99)
Potassium: 3.9 mmol/L (ref 3.5–5.1)
Sodium: 130 mmol/L — ABNORMAL LOW (ref 135–145)

## 2022-09-13 LAB — PHOSPHORUS: Phosphorus: 3.6 mg/dL (ref 2.5–4.6)

## 2022-09-13 LAB — COOXEMETRY PANEL
Carboxyhemoglobin: 1.6 % — ABNORMAL HIGH (ref 0.5–1.5)
Methemoglobin: 0.8 % (ref 0.0–1.5)
O2 Saturation: 56 %
Total hemoglobin: 7.8 g/dL — ABNORMAL LOW (ref 12.0–16.0)

## 2022-09-13 LAB — MAGNESIUM: Magnesium: 1.8 mg/dL (ref 1.7–2.4)

## 2022-09-13 MED ORDER — GABAPENTIN 300 MG PO CAPS
300.0000 mg | ORAL_CAPSULE | Freq: Every day | ORAL | Status: DC
  Administered 2022-09-13: 300 mg via ORAL
  Filled 2022-09-13: qty 1

## 2022-09-13 MED ORDER — CALCIUM GLUCONATE-NACL 1-0.675 GM/50ML-% IV SOLN
1.0000 g | Freq: Once | INTRAVENOUS | Status: AC
  Administered 2022-09-13: 1000 mg via INTRAVENOUS
  Filled 2022-09-13: qty 50

## 2022-09-13 MED ORDER — LACTATED RINGERS IV BOLUS
1000.0000 mL | Freq: Once | INTRAVENOUS | Status: AC
  Administered 2022-09-13: 1000 mL via INTRAVENOUS

## 2022-09-13 NOTE — Progress Notes (Signed)
Daily Progress Note   Patient Name: Peter Yang       Date: 09/13/2022 DOB: [birth year]  Age: 66 y.o. MRN#: [id number] Attending Physician: Margaretha Seeds, MD Primary Care Physician: Clovia Cuff, MD Admit Date: 08/31/2022 Length of Stay: 12 days  Reason for Consultation/Follow-up: Establishing goals of care  Subjective:   CC: Patient seen in afternoon after working with PT. Following up regarding complex medical decision making.   Subjective:  Discussed care with bedside RN prior to seeing patient.  Attempted to see patient in a.m. though he was sleeping comfortably and had expressed to staff he wanted to be allowed to rest.  Followed up with second visit in afternoon.  Patient welcoming this provider for a visit.  Inquired about the events of the day.  Patient was able to update me that he was able to work with physical therapy to sit in the chair at bedside even if it was for short period of time.  He described that while working with PT his blood pressure actually increased.  Patient remains on 1 of Levophed at this time.  Hoping to wean today. Acknowledged patient's progress and improvement of blood pressure while working with physical therapy.  Expressed appreciation this is one small though important step to getting him closer to his goal of being able to get out of the hospital.  Patient seemed to acknowledge this.  Spent time providing emotional support.  Patient noted that his friend from [place] is planning to check on his house later today.  He noted his other friend from [place] is monitoring the release of his Social Security check and assisting with management of that.  Expressed appreciation that he has friends who are willing to help him during this difficult time.  Inquired about ACP completion today.  Patient still willing to complete documentation today.  Noted chaplain would be the by later today and patient needed to participate in completing this paperwork  with him.  Patient agreeing with this.  Provided emotional support via active listening.  All questions answered at that time.  Noted this palliative care provider would continue to follow along with patient's journey.  Patient voiced appreciation for visit today.  Discussed care with bedside RN and primary PCCM team after visit.  Review of Systems Denies any symptoms of concern at this time.  Objective:   Vital Signs:  BP 98/67 (BP Location: Left Arm)   Pulse 69   Temp (!) 97 F (36.1 C) (Axillary)   Resp 11   Ht '6\' 4"'$  (1.93 m)   Wt 103.2 kg   SpO2 98%   BMI 27.69 kg/m   Physical Exam: General: Awake, alert, interactive, chronically ill appearing  Eyes: No drainage noted HENT: moist mucous membranes Cardiovascular: RRR Respiratory: no increased work of breathing noted, not in respiratory distress Extremities: L BKA Skin: no rashes or lesions on visible skin Psych: frustrated though willing to talk to some extent  Imaging: I personally reviewed recent imaging.   Assessment & Plan:   Assessment: 66 year old man, from [place], admitted to the hospital 08/31/2022 because of hematuria. He has a past medical history of CAD/NSTEMI, AAA, TIA, PAF on Eliquis, diabetes, hypertension, heart failure with reduced ejection fraction left ventricular ejection fraction 40-45%, history of recent COVID. Patient admitted to hospital medicine service because of a complicated UTI and hematuria, cultures positive for ESBL, antibiotics adjusted, urology also consulted for scrotal erythema and cellulitis, remains on antibiotics, hospital course complicated by  ongoing hypotension requiring peripheral Levophed and Dobutamine support. Patient also on intravenous heparin infusion. PMT consulted for goals of care discussions.   Recommendations/Plan: # Complex medical decision making/goals of care:  -Patient previously stated that his quality of life would be continuing to do  things he was doing prior to hospitalization such as bass fishing.  Did express concern that based on his heart function the activities he used to do may not be possible anymore.  Continue to encourage patient to further think about what quality time would look like for him moving forward in this situation.  -Patient willing to complete ACP documentation today to state his wishes for medical care.  Discussed with chaplain who will assist with completion.  Patient did confirm he is to remain DNR/DNI and would like to be an organ donor.  -Patient provided the name of 2 friends that can be contacted only if he dies.  Names are: Peter Yang number [phone] in [place] and Peter Yang number [phone] in [place].  Encourage patient that when he completes ACP documentation, he should think about who he would want contacted if he is unable to answer medical questions though has not reached the state of death yet such as being in a coma.  Patient to consider this.  -  Code Status: DNR  # Symptom management:  Pain   -Patient is on Tylenol and Neurontin, lidocaine patch.    Mood   Patient appears depressed. Again did not push this issue as patient willing to engage in slight conversation today and did not want him to shut down during discussion. Already on trazodone qhs though may want to consider SSRI with potential for orthostatic hypotension associated with trazodone. Could consider addition of morning dose of escitalopram.   # Psychosocial Support:  -No next of kin, no family, no HCPOA, does not want friends contacted.     -Patient willing to provide the name of 2 friends that can be contacted only if he dies.  Names are: Peter Yang number [phone] in [place] and Peter Yang number [phone] in [place].    # Discharge Planning: remains in ICU due to continued vasopressor support  Discussed with: bedside RN, patient, chaplain  Thank you for allowing the palliative care  team to participate in the care Peter Yang.  Peter Aus, DO Palliative Care Provider PMT # [phone]  This provider spent a total of 38 minutes providing patient's care.  Includes review of EMR, discussing care with other staff members involved in patient's medical care, obtaining relevant history and information from patient and/or patient's family, and personal review of imaging and lab work. Greater than 50% of the time was spent counseling and coordinating care related to the above assessment and plan.

## 2022-09-13 NOTE — Progress Notes (Signed)
Physical Therapy Treatment Patient Details Name: Peter Yang MRN: [id number] DOB: 07-06-1956 Today's Date: 09/13/2022   History of Present Illness Pt is a 66 year old man admitted from SNF with gross hematuria, UTI and sepsis on 11/11. PMH: CAD, NSTEMI, AAA, TIA, PAF, DM, diabetic neuropathy, L BKA, HTN, HLD, CHF with EF of 40-45%, recent admissions with sepsis, UTI, hypoglycemia and COVID.    PT Comments    OT/PT co-treat with patient. Treatment focused on activity tolerance at edge of bed, transfers, and ADLs. Patient max assist to transfer to edge of bed. Patient needed assistance to don right shoe but could don/doff liner on left stump. Unable to don  prosthesis 2* residual limb edema. Patient max x 2 to transfer to recliner. Once in recliner patient wanted to return to bed for lunch. Once again he needed max x 2 to to transfer. He was poor shoulder ROM and strength and overall generalized weakness limiting his ability to assist with transfers. Pt doesn't report any symptoms with sitting/transfer other than a headache. Continue to recommend short term rehab at discharge   Recommendations for follow up therapy are one component of a multi-disciplinary discharge planning process, led by the attending physician.  Recommendations may be updated based on patient status, additional functional criteria and insurance authorization.  Follow Up Recommendations  Skilled nursing-short term rehab (<3 hours/day) Can patient physically be transported by private vehicle: No   Assistance Recommended at Discharge Frequent or constant Supervision/Assistance  Patient can return home with the following Two people to help with walking and/or transfers;Help with stairs or ramp for entrance;A little help with bathing/dressing/bathroom;Assistance with cooking/housework;Assist for transportation   Equipment Recommendations  None recommended by PT    Recommendations for Other Services       Precautions /  Restrictions Precautions Precautions: Fall Precaution Comments: L BKA with prosthesis in room, monitor BP Restrictions Weight Bearing Restrictions: No     Mobility  Bed Mobility Overal bed mobility: Needs Assistance Bed Mobility: Supine to Sit, Sit to Supine     Supine to sit: Max assist Sit to supine: Min assist   General bed mobility comments: Increased time with assist to bring trunk to upright on exiting bed and assist with LEs to return to bed    Transfers Overall transfer level: Needs assistance   Transfers: Bed to chair/wheelchair/BSC            Lateral/Scoot Transfers: +2 physical assistance, +2 safety/equipment, Max assist General transfer comment: Lateral scoot bed<>recliner with increased time and use of bed pad to complete task    Ambulation/Gait               General Gait Details: Pt initially agreeable to attempt standing but unable to don prosthesis 2* edema in residual limb   Stairs             Wheelchair Mobility    Modified Rankin (Stroke Patients Only)       Balance Overall balance assessment: Needs assistance Sitting-balance support: No upper extremity supported, Feet supported Sitting balance-Leahy Scale: Fair                                      Cognition Arousal/Alertness: Awake/alert Behavior During Therapy: Flat affect Overall Cognitive Status: Within Functional Limits for tasks assessed  Exercises      General Comments        Pertinent Vitals/Pain Pain Assessment Pain Assessment: No/denies pain Pain Intervention(s): Limited activity within patient's tolerance, Monitored during session    Home Living                          Prior Function            PT Goals (current goals can now be found in the care plan section) Acute Rehab PT Goals Patient Stated Goal: to get better and return home PT Goal Formulation: With  patient Time For Goal Achievement: 09/19/22 Potential to Achieve Goals: Fair Progress towards PT goals: Progressing toward goals    Frequency    Min 2X/week      PT Plan Current plan remains appropriate    Co-evaluation PT/OT/SLP Co-Evaluation/Treatment: Yes Reason for Co-Treatment: For patient/therapist safety;To address functional/ADL transfers PT goals addressed during session: Mobility/safety with mobility OT goals addressed during session: ADL's and self-care      AM-PAC PT "6 Clicks" Mobility   Outcome Measure  Help needed turning from your back to your side while in a flat bed without using bedrails?: None Help needed moving from lying on your back to sitting on the side of a flat bed without using bedrails?: A Little Help needed moving to and from a bed to a chair (including a wheelchair)?: A Lot Help needed standing up from a chair using your arms (e.g., wheelchair or bedside chair)?: Total Help needed to walk in hospital room?: Total Help needed climbing 3-5 steps with a railing? : Total 6 Click Score: 12    End of Session Equipment Utilized During Treatment: Gait belt Activity Tolerance: Patient tolerated treatment well Patient left: in bed Nurse Communication: Mobility status PT Visit Diagnosis: Other abnormalities of gait and mobility (R26.89);Muscle weakness (generalized) (M62.81)     Time: 2426-8341 PT Time Calculation (min) (ACUTE ONLY): 38 min  Charges:  $Therapeutic Activity: 23-37 mins                     [place] Pager [phone] Office [phone]    Teah Votaw 09/13/2022, 3:32 PM

## 2022-09-13 NOTE — Progress Notes (Signed)
Occupational Therapy Treatment Patient Details Name: Peter Yang MRN: [id number] DOB: 1956-08-16 Today's Date: 09/13/2022   History of present illness Pt is a 66 year old man admitted from SNF with gross hematuria, UTI and sepsis on 11/11. PMH: CAD, NSTEMI, AAA, TIA, PAF, DM, diabetic neuropathy, L BKA, HTN, HLD, CHF with EF of 40-45%, recent admissions with sepsis, UTI, hypoglycemia and COVID.   OT comments  OT/PT cotreateat with patient. Treatment focused on activity tolerance at edge of bed, transfers, and ADLs. Patient max assist to transfer to edge of bed. Patient needed assistance to don right shoe but could don/doff liner on left stump. Prosthesis did not fit well today. Patient max x 2 to transfer to recliner. Once in recliner patient wanted to return to bed for lunch. Once again he needed max x 2 to to transfer. He was poor shoulder ROM and strength and overall generalized weakness limiting his ability to assist with transfers. Patient's BP below systolically at end of treatment but up to 118 after transfer to chair. He doesn't report any symptoms with sitting/transfer other than a headache. Continue to recommend short term rehab at discharge.    Recommendations for follow up therapy are one component of a multi-disciplinary discharge planning process, led by the attending physician.  Recommendations may be updated based on patient status, additional functional criteria and insurance authorization.    Follow Up Recommendations  Skilled nursing-short term rehab (<3 hours/day)     Assistance Recommended at Discharge Frequent or constant Supervision/Assistance  Patient can return home with the following  A lot of help with bathing/dressing/bathroom;Assistance with cooking/housework;Direct supervision/assist for medications management;Direct supervision/assist for financial management;Assist for transportation;Help with stairs or ramp for entrance;Two people to help with walking and/or  transfers   Equipment Recommendations  None recommended by OT    Recommendations for Other Services      Precautions / Restrictions Precautions Precautions: Fall Precaution Comments: L BKA with prosthesis in room, monitor BP Restrictions Weight Bearing Restrictions: No       Mobility Bed Mobility                    Transfers                         Balance Overall balance assessment: Needs assistance Sitting-balance support: No upper extremity supported, Feet supported Sitting balance-Leahy Scale: Fair                                     ADL either performed or assessed with clinical judgement   ADL Overall ADL's : Needs assistance/impaired                     Lower Body Dressing: Moderate assistance;Sitting/lateral leans Lower Body Dressing Details (indicate cue type and reason): Needed assistance to don R shoe. Patient able to don and doff prosthetic liner on left.             Functional mobility during ADLs: +2 for physical assistance General ADL Comments: Max assist for transfer to edge of bed. Prosthesis not fitting well today so standing deferred. Max x 2 to scoot to reciner and then back to bed when patient wanting to get back to bed.    Extremity/Trunk Assessment              Vision Patient Visual Report: No change from  baseline     Perception     Praxis      Cognition Arousal/Alertness: Awake/alert Behavior During Therapy: Flat affect Overall Cognitive Status: Within Functional Limits for tasks assessed                                          Exercises      Shoulder Instructions       General Comments      Pertinent Vitals/ Pain       Pain Assessment Pain Assessment: No/denies pain  Home Living                                          Prior Functioning/Environment              Frequency  Min 2X/week        Progress Toward Goals  OT  Goals(current goals can now be found in the care plan section)  Progress towards OT goals: OT to reassess next treatment  Acute Rehab OT Goals Patient Stated Goal: did not state Time For Goal Achievement: 09/20/22 Potential to Achieve Goals: [place] Discharge plan remains appropriate    Co-evaluation    PT/OT/SLP Co-Evaluation/Treatment: Yes Reason for Co-Treatment: To address functional/ADL transfers;For patient/therapist safety;Complexity of the patient's impairments (multi-system involvement) PT goals addressed during session: Mobility/safety with mobility OT goals addressed during session: ADL's and self-care      AM-PAC OT "6 Clicks" Daily Activity     Outcome Measure   Help from another person eating meals?: None Help from another person taking care of personal grooming?: A Little Help from another person toileting, which includes using toliet, bedpan, or urinal?: Total (catheter) Help from another person bathing (including washing, rinsing, drying)?: A Lot Help from another person to put on and taking off regular upper body clothing?: A Little Help from another person to put on and taking off regular lower body clothing?: A Lot 6 Click Score: 15    End of Session    OT Visit Diagnosis: Muscle weakness (generalized) (M62.81);Unsteadiness on feet (R26.81);Other abnormalities of gait and mobility (R26.89)   Activity Tolerance Patient tolerated treatment well   Patient Left in bed;with call bell/phone within reach;with nursing/sitter in room   Nurse Communication Mobility status        Time: 1125-1204 OT Time Calculation (min): 39 min  Charges: OT General Charges $OT Visit: 1 Visit OT Treatments $Self Care/Home Management : 8-22 mins  Gustavo Lah, OTR/L [place]  Office [phone]   Lenward Chancellor 09/13/2022, 12:37 PM

## 2022-09-13 NOTE — Progress Notes (Signed)
NAME:  Peter Yang, MRN:  [id number], DOB:  1956/01/28, LOS: 12 ADMISSION DATE:  08/31/2022, CONSULTATION DATE:  11/11 REFERRING MD:  Lelon Mast FOR CONSULT: sepsis   History of Present Illness:  Peter Yang is an 66 y.o. M who presented to the [facility] ED with a chief complaint of hematuria.  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  Sent to the emergency department SNF staff on 11/11.  At the emergency department he developed hypotension with a BP of 83/63. WBC 14.2, lactate 5.0 > 7.9.  Code sepsis was called.  Blood cultures and urine cultures were obtained.  Patient was given fluid bolus per sepsis guidelines.  Patient with complicated infectious history-started on meropenem and amoxicillin by [facility].  Urology was consulted for hematuria and concern for forniers.  Recommends monitoring with possible cystoscopy once Eliquis washout. [facility] admitted patient.  PCCM was consulted for assistance with sepsis with high lactate.  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% BPH (hx prostate CA) SNF resident  Significant Hospital Events: Including procedures, antibiotic start and stop dates in addition to other pertinent events   11/11 Admit by [facility], BC>, UC>, PCCM consult, Mero and amoxicillin> 11/12 Started on peripheral Levophed for persistent hypotension, albumin 11/13 Troponin rising from 263 to 771, DVT/PE ruled out 11/14 dobutamine started for low coox, lower EF on echo 11/15 trial of restarting heparin 11/17 off dobutamine 11/19 coox 93, remains on pressors 11/20 Coox 67, CVP 8, on 3 norepi 11/21 Coox 75.1, 4 norepi  11/23 Norepi weaned to 47mg, hypotension into 80's overnight > increased levo. Robust UOP despite no  lasix in 72 hours.    Interim History / Subjective:  Afebrile  Remains on 168m levo  On RA  Glucose range 218-288  I/O 2.6L UOP, -61656mn last 24 hours (-14.7L since admit)  Objective   Blood pressure 98/67, pulse 69, temperature (!) 97 F (36.1 C), temperature source Axillary, resp. rate 11, height '6\' 4"'$  (1.93 m), weight 103.2 kg, SpO2 98 %. CVP:  [5 mmHg-17 mmHg] 9 mmHg      Intake/Output Summary (Last 24 hours) at 09/13/2022 0748 Last data filed at 09/13/2022 0730 Gross per 24 hour  Intake 2098.18 ml  Output 2655 ml  Net -556.82 ml   Filed Weights   09/11/22 0500 09/12/22 0500 09/13/22 0500  Weight: 99 kg 102.1 kg 103.2 kg   Physical Exam: General: chronically ill appearing adult male lying in bed in NAD Psy: flat affect HEENT: MM pink/moist, on RA, anicteric  Neuro: sleeping, appropriate when awake, MAE/generalized weakness  CV: s1s2 RRR, no m/r/g PULM: non-labored at rest, lungs bilaterally clear GI: soft, bsx4 active  Extremities: warm/dry, no edema, L BKA   Skin: no rashes or lesions  Resolved Hospital Problem list   Hypophosphatemia NAGMA  Assessment & Plan:   Shock, initially suspected septic from UTI, now looking more likely cardiogenic shock. All cultures have remained negative this admission and he has been on antibiotics for weeks pre-dating this admit. Acute drop in EF, low coox. Acute on chronic HFrEF, ICM, CAD with stent in 05/2022 > 09/03/22 TTE> EF now 30-35, G2DD  pAF Anasarca - improved Lactic acidosis, secondary to above -LR bolus  -  Calcium 1 gm x1 (corrects to 8.8 but remains on pressors) -hold diuresis  -appreciate Cardiology assistance > not a candidate for home inotropes, revascularization, transplant or LVAD. Not candidate for GDMT due to ongoing hypotension  -change levophed parameters to SBP >90 or MAP >60  -midodrine '15mg'$  TID  -continue hydrocortisone '100mg'$  Q8, wean to off once off pressors  -intermittent Co-Ox  -continue eliquis &  plavix  Prostatitis Scrotal Cellulitis H/O ESBL E.Coli and CRE Proteus -appreciate ID  -continue ertapenem per ID, 4-6 weeks treatment for prostatitis  -f/u in [place] with ID, has established care   Hematuria due to UTI Known prostate cancer, under surveillance with urology -continue eliquis, plavix  -follow H/H  -keep foley in place until seen by Urology after discharge (per last discussion with urology / they were ok with voiding trial if he was ambulatory, otherwise keep until seen in office) -continue eliquis, plavix  -continue flomax, finasteride   Hyponatremia  Mild AKI  Hypocalcemia  -LR bolus -replace Ca+, 1gm 11/24  -follow sodium trend    DM2 -levemir 26 units BID  -SSI, moderate scale  -HD sliding scale  -meal coverage 8 units TID    Deconditioning -PT/OT  -use prosthesis, mobilize as able    Chronic Stage 2 sacral ulcer -wound care per protocol  Insomnia, phantom limb pain LLE -reduce QHS gabapentin to 300 mg QHS with hypotension -melatonin, trazodone, ambien QHS PRN   Chronic Critical Illness  -palliative care input appreciated -DNR status   Best Practice (right click and "Reselect all SmartList Selections" daily)  Diet/type: Regular consistency > carb modified/ heart healthy DVT prophylaxis: Eliquis GI prophylaxis: PPI   Lines: PICC Foley: yes and it is still needed Code Status:  DNR   Critical care time: 35 minutes   Noe Gens, MSN, APRN, NP-C, AGACNP-BC [facility] 09/13/2022, 8:11 AM   Please see [email or web address] for pager details.   From 7A-7P if no response, please call [phone] After hours, please call ELink [phone]

## 2022-09-13 NOTE — Plan of Care (Signed)
  Problem: Education: Goal: Ability to describe self-care measures that may prevent or decrease complications (Diabetes Survival Skills Education) will improve Outcome: Progressing Goal: Individualized Educational Video(s) Outcome: Progressing   Problem: Coping: Goal: Ability to adjust to condition or change in health will improve Outcome: Progressing   Problem: Fluid Volume: Goal: Ability to maintain a balanced intake and output will improve Outcome: Progressing   Problem: Health Behavior/Discharge Planning: Goal: Ability to identify and utilize available resources and services will improve Outcome: Progressing Goal: Ability to manage health-related needs will improve Outcome: Progressing   Problem: Metabolic: Goal: Ability to maintain appropriate glucose levels will improve Outcome: Progressing   Problem: Nutritional: Goal: Maintenance of adequate nutrition will improve Outcome: Progressing Goal: Progress toward achieving an optimal weight will improve Outcome: Progressing   Problem: Skin Integrity: Goal: Risk for impaired skin integrity will decrease Outcome: Progressing   Problem: Tissue Perfusion: Goal: Adequacy of tissue perfusion will improve Outcome: Progressing   Problem: Fluid Volume: Goal: Hemodynamic stability will improve Outcome: Progressing   Problem: Clinical Measurements: Goal: Diagnostic test results will improve Outcome: Progressing Goal: Signs and symptoms of infection will decrease Outcome: Progressing   Problem: Respiratory: Goal: Ability to maintain adequate ventilation will improve Outcome: Progressing   Problem: Education: Goal: Knowledge of General Education information will improve Description: Including pain rating scale, medication(s)/side effects and non-pharmacologic comfort measures Outcome: Progressing   Problem: Health Behavior/Discharge Planning: Goal: Ability to manage health-related needs will improve Outcome:  Progressing   Problem: Clinical Measurements: Goal: Ability to maintain clinical measurements within normal limits will improve Outcome: Progressing Goal: Will remain free from infection Outcome: Progressing Goal: Diagnostic test results will improve Outcome: Progressing Goal: Respiratory complications will improve Outcome: Progressing Goal: Cardiovascular complication will be avoided Outcome: Progressing   Problem: Activity: Goal: Risk for activity intolerance will decrease Outcome: Progressing   Problem: Nutrition: Goal: Adequate nutrition will be maintained Outcome: Progressing   Problem: Coping: Goal: Level of anxiety will decrease Outcome: Progressing   Problem: Elimination: Goal: Will not experience complications related to bowel motility Outcome: Progressing Goal: Will not experience complications related to urinary retention Outcome: Progressing   Problem: Pain Managment: Goal: General experience of comfort will improve Outcome: Progressing   Problem: Safety: Goal: Ability to remain free from injury will improve Outcome: Progressing   Problem: Skin Integrity: Goal: Risk for impaired skin integrity will decrease Outcome: Progressing   Problem: Urinary Elimination: Goal: Signs and symptoms of infection will decrease Outcome: Progressing

## 2022-09-13 NOTE — Progress Notes (Signed)
Rounding Note    Patient Name: Peter Yang Date of Encounter: 09/13/2022  [place] Cardiologist:   Dr Blenda Bridegroom, [place], [facility] Dr Debara Pickett  Subjective   Resting comfortably.  Inpatient Medications    Scheduled Meds:  apixaban  5 mg Oral BID   atorvastatin  80 mg Oral Daily   Chlorhexidine Gluconate Cloth  6 each Topical Daily   clopidogrel  75 mg Oral Daily   finasteride  5 mg Oral Daily   gabapentin  300 mg Oral QHS   hydrocortisone sod succinate (SOLU-CORTEF) inj  100 mg Intravenous Q8H   insulin aspart  0-15 Units Subcutaneous TID WC   insulin aspart  0-5 Units Subcutaneous QHS   insulin aspart  8 Units Subcutaneous TID WC   insulin detemir  26 Units Subcutaneous BID   lidocaine  1 patch Transdermal Daily   melatonin  5 mg Oral QHS   midodrine  15 mg Oral TID with meals   nystatin   Topical BID   pantoprazole  40 mg Oral Daily   sodium chloride flush  10-40 mL Intracatheter Q12H   tamsulosin  0.4 mg Oral Daily   traZODone  50 mg Oral QHS   Continuous Infusions:  sodium chloride 250 mL (09/11/22 2144)   sodium chloride 10 mL/hr at 09/11/22 0431   calcium gluconate 1,000 mg (09/13/22 0832)   ertapenem Stopped (09/12/22 1800)   norepinephrine (LEVOPHED) Adult infusion 1 mcg/min (09/13/22 0700)   PRN Meds: sodium chloride, acetaminophen **OR** acetaminophen, diclofenac Sodium, mouth rinse, polyvinyl alcohol, sodium chloride flush, zolpidem   Vital Signs    CVP 9-11 overnight, now 10 Vitals:   09/13/22 0600 09/13/22 0700 09/13/22 0715 09/13/22 0730  BP: 96/61 99/62 103/66 98/67  Pulse: 66 71 66 69  Resp: _0 Temp:  (!) 97 F (36.1 C)    TempSrc:  Axillary    SpO2: 99% 98% 97% 98%  Weight:      Height:        Intake/Output Summary (Last 24 hours) at 09/13/2022 0920 Last data filed at 09/13/2022 0730 Gross per 24 hour  Intake 1738.18 ml  Output 2600 ml  Net -861.82 ml      09/13/2022    5:00 AM  09/12/2022    5:00 AM 09/11/2022    5:00 AM  Last 3 Weights  Weight (lbs) 227 lb 8.2 oz 225 lb 1.4 oz 218 lb 4.1 oz  Weight (kg) 103.2 kg 102.1 kg 99 kg      Telemetry    SR, PVCs noted, no adverse arrhythmias- Personally Reviewed  ECG    None today- Personally Reviewed  Physical Exam   GEN: Chronically ill-appearing male, no acute distress.  No significant change today.   Labs       Latest Ref Rng & Units 09/10/2022    5:21 AM 09/11/2022    4:45 AM 09/11/2022   12:00 PM 09/12/2022    3:58 AM 09/13/2022    3:35 AM  Total Hgb  Total Hgb 12.0 - 16.0 g/dL 7.8  7.9  8.1  8.4  7.8       Latest Ref Rng & Units 09/10/2022    5:21 AM 09/11/2022    4:45 AM 09/11/2022   12:00 PM 09/12/2022    3:58 AM 09/13/2022    3:35 AM  O2 Sat  O2 Sat % 75.1  58.8  57.1  53.2  56  Latest Ref Rng & Units 09/10/2022    5:21 AM 09/11/2022    4:45 AM 09/11/2022   12:00 PM 09/12/2022    3:58 AM 09/13/2022    3:35 AM  Carboxy Hgb  Carboxy Hgb 0.5 - 1.5 % 1.2  1.5  1.5  2.3  1.6       Latest Ref Rng & Units 09/10/2022    5:21 AM 09/11/2022    4:45 AM 09/11/2022   12:00 PM 09/12/2022    3:58 AM 09/13/2022    3:35 AM  Met Hgb  Met Hgb 0.0 - 1.5 % <0.7  1.2  1.0  2.1  0.8       09/13/2022    5:45 AM 09/13/2022    6:00 AM 09/13/2022    7:00 AM 09/13/2022    7:15 AM 09/13/2022    7:30 AM  CVP  CVP (mmHg) 8 mmHg 7 mmHg 9 mmHg 9 mmHg 9 mmHg    High Sensitivity Troponin:   Recent Labs  Lab 09/01/22 0040 09/02/22 1935  TROPONINIHS 263* 771*     Chemistry Recent Labs  Lab 09/09/22 0415 09/09/22 0603 09/10/22 0521 09/11/22 0445 09/11/22 1200 09/12/22 0359 09/13/22 0340  NA  --    < > 130* 133*  --  130* 130*  K  --    < > 4.2 4.1  --  4.5 3.9  CL  --    < > 99 101  --  99 104  CO2  --    < > 24 24  --  22 20*  GLUCOSE  --    < > 243* 268*  --  262* 288*  BUN  --    < > 28* 30*  --  43* 46*  CREATININE  --    < > 0.89 0.87  --  1.26* 1.14  CALCIUM  --    < >  8.1* 8.5*  --  8.6* 7.7*  MG 2.0  --   --   --   --  2.0 1.8  PROT  --   --   --   --  5.5*  --   --   ALBUMIN  --   --  2.5*  --  2.6*  --   --   AST  --   --   --   --  17  --   --   ALT  --   --   --   --  23  --   --   ALKPHOS  --   --   --   --  78  --   --   BILITOT  --   --   --   --  0.7  --   --   GFRNONAA  --    < > >60 >60  --  >60 >60  ANIONGAP  --    < > 7 8  --  9 6   < > = values in this interval not displayed.    Lipids No results for input(s): "CHOL", "TRIG", "HDL", "LABVLDL", "LDLCALC", "CHOLHDL" in the last 168 hours.  Hematology Recent Labs  Lab 09/10/22 0521 09/11/22 0445 09/12/22 0359  WBC 7.7 5.1 8.7  RBC 2.94* 3.00* 3.06*  HGB 7.6* 7.6* 7.6*  HCT 24.8* 25.1* 25.2*  MCV 84.4 83.7 82.4  MCH 25.9* 25.3* 24.8*  MCHC 30.6 30.3 30.2  RDW 16.2* 16.3* 16.5*  PLT 223 207 249   Thyroid  Lab  Results  Component Value Date   TSH 5.441 (H) 08/03/2022     BNP No results for input(s): "BNP", "PROBNP" in the last 168 hours.   DDimer  No results for input(s): "DDIMER" in the last 168 hours.    Radiology    No results found.  Cardiac Studies   Echo 09/03/22  1. Left ventricular ejection fraction, by estimation, is 30 to 35%. The left ventricle has moderately decreased function. The left ventricle demonstrates regional wall motion abnormalities (see scoring diagram/findings for description). The left ventricular internal cavity size was mildly dilated. Left ventricular diastolic parameters are consistent with Grade II diastolic dysfunction (pseudonormalization).   2. Right ventricular systolic function is moderately reduced. The right ventricular size is mildly enlarged. There is mildly elevated pulmonary artery systolic pressure. The estimated right ventricular systolic pressure is 33.3 mmHg.   3. Left atrial size was moderately dilated.   4. Right atrial size was mildly dilated.   5. The mitral valve is abnormal. Moderate mitral valve regurgitation. No  evidence of mitral stenosis.   6. The aortic valve is tricuspid. There is mild calcification of the aortic valve. Aortic valve regurgitation is not visualized. Aortic valve sclerosis is present, with no evidence of aortic valve stenosis.   7. Aortic dilatation noted. There is mild dilatation of the aortic root, measuring 42 mm.   8. The inferior vena cava is normal in size with <50% respiratory variability, suggesting right atrial pressure of 8 mmHg.   Comparison(s): Changes from prior study are noted. EF significantly reduced compared to prior, now with more prominent anterior wall motion abnormalities. Findings communicated to PCCM team.   Conclusion(s)/Recommendation(s): No left ventricular mural or apical  thrombus/thrombi.   Patient Profile     66 y.o. male with a hx of nonrevascularizable CAD, ascending TAA (not seen on CT 07/2022, 62m aortic root by echo 11/14), TIA, PAF found during 05/2022 admission. DM2 with polyneuropathy and diabetic ulcer, L BKA, HTN, HLD, chronic HFrEF, BPH, UTIs, bowel perforation, prsostate CA, OSA, prior retroperitoneal abscess who was admitted 11/11 with ongoing gross hematuria, shock req multiple pressors (midodrine at home), recent COVID, ESBL UTI, lactic acidosis, AKI, scrotal cellulitis, DNR.   Assessment & Plan    Acute on chronic CHF, CGS is part of his shock profile however coags fairly stable in the upper 547s  - EF 30%  - CVP 11 - On levophed down to 1 mcg/min, off dobutamine and on midodrine 15 mg tid. Stress dose steroids given started 11/21, no significant change in blood pressure.  Blood pressure seems to be chronically low. - 1300 cc autodiuresis yesterday - not currently on a diuretic  2. Hx PAF and CAD w/ PCI RCA 04/23, multiple occluded vessels, not amenable to PCI -back on eliquis  -No ischemic symptoms -back on plavix, no active signs of bleeding.  Stable  3. Prostatitis, deconditioning, social and other issues. Difficult situation  with failure to thrive  DNR   He is not a candidate for home inotropes, transplant, LVAD.  Has been discussed with palliative care team.  Signed, MCandee Furbish MD  09/13/2022, 9:20 AM

## 2022-09-13 NOTE — Progress Notes (Signed)
Chaplain met with patient to assist him with filling out a living will and advance directives.  He did not wish to assign anyone as his healthcare agent and would not want life-prolonging measures.  Chaplain assisted with education and getting the paperwork notarized.  [address], [place] Pager, [phone]

## 2022-09-13 NOTE — Inpatient Diabetes Management (Addendum)
Inpatient Diabetes Program Recommendations  AACE/ADA: New Consensus Statement on Inpatient Glycemic Control (2015)  Target Ranges:  Prepandial:   less than 140 mg/dL      Peak postprandial:   less than 180 mg/dL (1-2 hours)      Critically ill patients:  140 - 180 mg/dL   Lab Results  Component Value Date   GLUCAP 245 (H) 09/13/2022   HGBA1C 6.6 (H) 08/03/2022    Latest Reference Range & Units 09/12/22 11:43 09/12/22 16:55 09/12/22 21:27 09/13/22 08:13 09/13/22 12:06  Glucose-Capillary 70 - 99 mg/dL 278 (H) 251 (H) 222 (H) 264 (H) 245 (H)  (H): Data is abnormally high Review of Glycemic Control  Diabetes history: DM2 Outpatient Diabetes medications: Jardiance 10 mg daily, Metformin 1000 mg BID Current orders for Inpatient glycemic control: Levemir 26 units BID, Novolog 0-15 units correction scale TID & Novolog 0-5 units HS scale, Novolog 8 units TID  Inpatient Diabetes Program Recommendations:   Noted that blood sugars have been greater than 200 mg/dl.  Recommend increasing Novolog correction scale to RESISTANT (0-20 units TID) and continue Novolog 0-5 units HS scale, especially while on steroids.  Harvel Ricks RN BSN CDE Diabetes Coordinator Pager: [phone]  8am-5pm

## 2022-09-14 DIAGNOSIS — I5023 Acute on chronic systolic (congestive) heart failure: Secondary | ICD-10-CM | POA: Diagnosis not present

## 2022-09-14 DIAGNOSIS — Z515 Encounter for palliative care: Secondary | ICD-10-CM | POA: Diagnosis not present

## 2022-09-14 DIAGNOSIS — N492 Inflammatory disorders of scrotum: Secondary | ICD-10-CM

## 2022-09-14 DIAGNOSIS — N410 Acute prostatitis: Secondary | ICD-10-CM | POA: Diagnosis not present

## 2022-09-14 DIAGNOSIS — I5020 Unspecified systolic (congestive) heart failure: Secondary | ICD-10-CM | POA: Diagnosis not present

## 2022-09-14 DIAGNOSIS — R52 Pain, unspecified: Secondary | ICD-10-CM

## 2022-09-14 DIAGNOSIS — R570 Cardiogenic shock: Secondary | ICD-10-CM | POA: Diagnosis not present

## 2022-09-14 DIAGNOSIS — R310 Gross hematuria: Secondary | ICD-10-CM | POA: Diagnosis not present

## 2022-09-14 DIAGNOSIS — Z79899 Other long term (current) drug therapy: Secondary | ICD-10-CM

## 2022-09-14 LAB — BASIC METABOLIC PANEL
Anion gap: 7 (ref 5–15)
BUN: 47 mg/dL — ABNORMAL HIGH (ref 8–23)
CO2: 20 mmol/L — ABNORMAL LOW (ref 22–32)
Calcium: 8.3 mg/dL — ABNORMAL LOW (ref 8.9–10.3)
Chloride: 103 mmol/L (ref 98–111)
Creatinine, Ser: 1.1 mg/dL (ref 0.61–1.24)
GFR, Estimated: >60 mL/min (ref >60)
Glucose, Bld: 202 mg/dL — ABNORMAL HIGH (ref 70–99)
Potassium: 4.3 mmol/L (ref 3.5–5.1)
Sodium: 130 mmol/L — ABNORMAL LOW (ref 135–145)

## 2022-09-14 LAB — CBC
HCT: 25.5 % — ABNORMAL LOW (ref 39.0–52.0)
Hemoglobin: 7.7 g/dL — ABNORMAL LOW (ref 13.0–17.0)
MCH: 24.9 pg — ABNORMAL LOW (ref 26.0–34.0)
MCHC: 30.2 g/dL (ref 30.0–36.0)
MCV: 82.5 fL (ref 80.0–100.0)
Platelets: 226 10*3/uL (ref 150–400)
RBC: 3.09 MIL/uL — ABNORMAL LOW (ref 4.22–5.81)
RDW: 16.6 % — ABNORMAL HIGH (ref 11.5–15.5)
WBC: 8.1 10*3/uL (ref 4.0–10.5)
nRBC: 0 % (ref 0.0–0.2)

## 2022-09-14 LAB — GLUCOSE, CAPILLARY
Glucose-Capillary: 201 mg/dL — ABNORMAL HIGH (ref 70–99)
Glucose-Capillary: 207 mg/dL — ABNORMAL HIGH (ref 70–99)
Glucose-Capillary: 203 mg/dL — ABNORMAL HIGH (ref 70–99)

## 2022-09-14 LAB — SODIUM, URINE, RANDOM: Sodium, Ur: 18 mmol/L

## 2022-09-14 LAB — MAGNESIUM: Magnesium: 1.8 mg/dL (ref 1.7–2.4)

## 2022-09-14 LAB — COOXEMETRY PANEL
Carboxyhemoglobin: 1.9 % — ABNORMAL HIGH (ref 0.5–1.5)
Methemoglobin: <0.7 % (ref 0.0–1.5)
O2 Saturation: 54.3 %
Total hemoglobin: 10.1 g/dL — ABNORMAL LOW (ref 12.0–16.0)

## 2022-09-14 LAB — PHOSPHORUS: Phosphorus: 3.7 mg/dL (ref 2.5–4.6)

## 2022-09-14 LAB — CHLORIDE, URINE, RANDOM: Chloride Urine: 17 mmol/L

## 2022-09-14 MED ORDER — NOREPINEPHRINE 4 MG/250ML-% IV SOLN: 0.0000 ug/min | INTRAVENOUS | Status: DC

## 2022-09-14 MED ORDER — NOREPINEPHRINE 4 MG/250ML-% IV SOLN
0.0000 ug/min | INTRAVENOUS | Status: DC
  Administered 2022-09-14: 2 ug/min via INTRAVENOUS
  Filled 2022-09-14: qty 250

## 2022-09-14 MED ORDER — TRAZODONE HCL 50 MG PO TABS: 50.0000 mg | ORAL_TABLET | Freq: Every day | ORAL | Status: DC

## 2022-09-14 MED ORDER — SODIUM CHLORIDE 0.9 % IV BOLUS
1000.0000 mL | Freq: Once | INTRAVENOUS | Status: AC
  Administered 2022-09-14: 1000 mL via INTRAVENOUS

## 2022-09-14 MED ORDER — MELATONIN 5 MG PO TABS: 5.0000 mg | ORAL_TABLET | Freq: Every day | ORAL | Status: DC

## 2022-09-14 MED ORDER — OXYCODONE HCL 5 MG PO TABS
2.5000 mg | ORAL_TABLET | Freq: Four times a day (QID) | ORAL | Status: DC | PRN
  Administered 2022-09-14 – 2022-09-15 (×2): 2.5 mg via ORAL
  Filled 2022-09-14 (×2): qty 1

## 2022-09-14 MED ORDER — MELATONIN 5 MG PO TABS
5.0000 mg | ORAL_TABLET | Freq: Every day | ORAL | Status: DC
  Administered 2022-09-14 – 2022-10-23 (×40): 5 mg via ORAL
  Filled 2022-09-14 (×40): qty 1

## 2022-09-14 MED ORDER — GABAPENTIN 300 MG PO CAPS
300.0000 mg | ORAL_CAPSULE | Freq: Every day | ORAL | Status: DC
  Administered 2022-09-14 – 2022-10-23 (×40): 300 mg via ORAL
  Filled 2022-09-14 (×40): qty 1

## 2022-09-14 MED ORDER — ZOLPIDEM TARTRATE 5 MG PO TABS
5.0000 mg | ORAL_TABLET | Freq: Every day | ORAL | Status: DC
  Administered 2022-09-14 – 2022-10-23 (×40): 5 mg via ORAL
  Filled 2022-09-14 (×41): qty 1

## 2022-09-14 MED ORDER — SODIUM CHLORIDE 0.9 % IV SOLN: INTRAVENOUS | Status: DC | PRN

## 2022-09-14 MED ORDER — TRAZODONE HCL 50 MG PO TABS
50.0000 mg | ORAL_TABLET | Freq: Every day | ORAL | Status: DC
  Administered 2022-09-14 – 2022-10-23 (×40): 50 mg via ORAL
  Filled 2022-09-14 (×40): qty 1

## 2022-09-14 MED ORDER — ZOLPIDEM TARTRATE 5 MG PO TABS: 5.0000 mg | ORAL_TABLET | Freq: Every day | ORAL | Status: DC

## 2022-09-14 MED ORDER — LACTATED RINGERS IV BOLUS
1000.0000 mL | Freq: Once | INTRAVENOUS | Status: AC
  Administered 2022-09-14: 1000 mL via INTRAVENOUS

## 2022-09-14 NOTE — Progress Notes (Signed)
Orthopedic Tech Progress Note Patient Details:  Peter Yang 24-Feb-1956 [id number] Left BK shrinker has been ordered from [facility].  Patient ID: Peter Yang, male   DOB: [birth year], 66 y.o.   MRN: [id number]  Jearld Lesch 09/14/2022, 4:17 PM

## 2022-09-14 NOTE — Progress Notes (Signed)
[facility] Progress Note Patient Name: Peter Yang DOB: [birth year] MRN: [id number]   Date of Service  09/14/2022  HPI/Events of Note  BP 81/53, MAP 59, patient put out almost 4 liters today but CVP still elevated.  eICU Interventions  Will re-start low dose Levophed  (ceiling of 10 mcg) targeting SBP > 90, MAP > 60.        [name] Loris Winrow 09/14/2022, 2:28 AM

## 2022-09-14 NOTE — Progress Notes (Signed)
Daily Progress Note   Patient Name: Peter Yang       Date: 09/14/2022 DOB: 09/29/1956  Age: 66 y.o. MRN#: [id number] Attending Physician: Margaretha Seeds, MD Primary Care Physician: Clovia Cuff, MD Admit Date: 08/31/2022 Length of Stay: 13 days  Reason for Consultation/Follow-up: Establishing goals of care and symptom management  Subjective:   CC: Patient seen in afternoon after working with PT. Following up regarding complex medical decision making.   Subjective:  Discussed care with bedside RN prior to seeing patient.  Patient had to be placed back on Levophed overnight due to hypotension.  Presented to bedside and patient agreeing with visit at this time.  Patient described attempts for arterial placements that were noted to be uncomfortable due to his low blood pressure overnight.  Allowed patient time to vent about frustrations.  Empathized with difficult situation and provided emotional support through active listening.  Patient's primary symptom concern was pain in his left BKA as well as his sleep.  Patient stated that he took 3 medications last night including the trazodone and Ambien that are supposed to help sleep though took about 5 hours to take effect.  Patient wanting to continue on medications though requesting earlier time they be given, we discussed idea of providing medications at 7 PM which he is agreeing to at this time.  Noted can make further adjustments moving forward based on his response.  Tried to further explore patient's wishes for medical care moving forward.  Asked patient to consider what his time would look like moving forward knowing that his heart is very weak.  Patient has expressed the goal of getting out of the hospital to return to his home and dissipate in activities that bring him joy such as bass fishing.  Had already expressed concern that patient's heart can no longer tolerate this level of activity.  Explained that patient is continuing to  need a form of life support which can only be provided here in the ICU via vasopressors.  This medication will continue to require him being in the ICU and not obtain the goals he has stated.  Also tried to explore with patient that some medical interventions may be more harmful/painful to him then he finds beneficial overall.  While patient frustratingly voiced that he does not even think the interventions he is receiving now are "beneficial" to him, he is also not voicing that he wants to discontinue them such as the vasopressors.  Encouraged him to seriously consider what his time looks like moving forward.  Patient noted he would think on this.  Answered all questions at that time.  Thanked patient for this provider and able to visit with him today. Review of Systems Denies any symptoms of concern at this time.  Objective:   Vital Signs:  BP 128/88   Pulse 68   Temp 97.9 F (36.6 C) (Oral)   Resp 15   Ht '6\' 4"'$  (1.93 m)   Wt 106.2 kg   SpO2 98%   BMI 28.50 kg/m   Physical Exam: General: Awake, alert, interactive, chronically ill appearing  Eyes: No drainage noted HENT: moist mucous membranes Cardiovascular: RRR Respiratory: no increased work of breathing noted, not in respiratory distress Extremities: L BKA Skin: no rashes or lesions on visible skin Psych: Willing to talk today  Imaging: I personally reviewed recent imaging.   Assessment & Plan:   Assessment: 66 year old man, from [place], admitted to the hospital 08/31/2022 because of hematuria. He  has a past medical history of CAD/NSTEMI, AAA, TIA, PAF on Eliquis, diabetes, hypertension, heart failure with reduced ejection fraction left ventricular ejection fraction 40-45%, history of recent COVID. Patient admitted to hospital medicine service because of a complicated UTI and hematuria, cultures positive for ESBL, antibiotics adjusted, urology also consulted for scrotal erythema and cellulitis, remains  on antibiotics, hospital course complicated by ongoing hypotension requiring peripheral Levophed and Dobutamine support. Patient also on intravenous heparin infusion. PMT consulted for goals of care discussions.   Recommendations/Plan: # Complex medical decision making/goals of care:  -Patient has stated that his quality of life would be continuing to do things he was doing prior to hospitalization such as bass fishing.  Have continued to express concern that his heart can no longer keep up with the activities he used to do prior.  This provider continues to attempt furthering conversations about patient's goals for medical care.  We did discuss waiting benefits over harms/pain in terms of medical interventions and how patient wants to spend that time he has moving forward.  Patient will continue to consider this.  -Patient completed ACP documentation with chaplain on 09/13/2022.  -Patient provided the name of 2 friends that can be contacted only if he dies.  Names are: Peter Yang number [phone] in [place] and Peter Yang number [phone] in [place].  Encourage patient that when he completes ACP documentation, he should think about who he would want contacted if he is unable to answer medical questions though has not reached the state of death yet such as being in a coma.  Patient to consider this.  -  Code Status: DNR  # Symptom management:  Pain   -Patient is on Tylenol and Neurontin, lidocaine patch.    -Added low dose oxycodone 2.'5mg'$  q6hrs prn.    Insomnia   -Adjusted the timing of patient's medications to assist with sleep management including trazodone, zolpidem, melatonin, and gabapentin to be scheduled at 7 PM (had to order for 6PM to avoid shift change though plan to administer at [facility] before this change).  # Psychosocial Support:  -No next of kin, no family, no HCPOA, does not want friends contacted.     -Patient willing to provide the name of 2 friends that can be  contacted only if he dies.  Names are: Peter Yang number [phone] in [place] and Peter Yang number [phone] in [place].    # Discharge Planning: remains in ICU due to continued vasopressor requirements at times  Discussed with: bedside RN, patient  Thank you for allowing the palliative care team to participate in the care [name].  Chelsea Aus, DO Palliative Care Provider PMT # [phone]  This provider spent a total of 51 minutes providing patient's care.  Includes review of EMR, discussing care with other staff members involved in patient's medical care, obtaining relevant history and information from patient and/or patient's family, and personal review of imaging and lab work. Greater than 50% of the time was spent counseling and coordinating care related to the above assessment and plan.

## 2022-09-14 NOTE — Progress Notes (Signed)
NAME:  Peter Yang, MRN:  [id number], DOB:  02-08-1956, LOS: 39 ADMISSION DATE:  08/31/2022, CONSULTATION DATE:  11/11 REFERRING MD:  Lelon Mast FOR CONSULT: sepsis   History of Present Illness:  Peter Yang is an 66 y.o. M who presented to the [facility] ED with a chief complaint of hematuria.  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  Sent to the emergency department SNF staff on 11/11.  At the emergency department he developed hypotension with a BP of 83/63. WBC 14.2, lactate 5.0 > 7.9.  Code sepsis was called.  Blood cultures and urine cultures were obtained.  Patient was given fluid bolus per sepsis guidelines.  Patient with complicated infectious history-started on meropenem and amoxicillin by [facility].  Urology was consulted for hematuria and concern for forniers.  Recommends monitoring with possible cystoscopy once Eliquis washout. [facility] admitted patient.  PCCM was consulted for assistance with sepsis with high lactate.  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% BPH (hx prostate CA) SNF resident  Significant Hospital Events: Including procedures, antibiotic start and stop dates in addition to other pertinent events   11/11 Admit by [facility], BC>, UC>, PCCM consult, Mero and amoxicillin> 11/12 Started on peripheral Levophed for persistent hypotension, albumin 11/13 Troponin rising from 263 to 771, DVT/PE ruled out 11/14 dobutamine started for low coox, lower EF on echo 11/15 trial of restarting heparin 11/17 off dobutamine 11/19 coox 93, remains on pressors 11/20 Coox 67, CVP 8, on 3 norepi 11/21 Coox 75.1, 4 norepi  11/23 Norepi weaned to 68mg, hypotension into 80's overnight > increased levo. Robust UOP despite no  lasix in 72 hours.    Interim History / Subjective:  Levophed weaned off briefly yesterday and restarted overnight.  Objective   Blood pressure 102/63, pulse 64, temperature (!) 97.4 F (36.3 C), temperature source Oral, resp. rate 12, height '6\' 4"'$  (1.93 m), weight 106.2 kg, SpO2 100 %. CVP:  [9 mmHg-20 mmHg] 17 mmHg      Intake/Output Summary (Last 24 hours) at 09/14/2022 1111 Last data filed at 09/14/2022 0800 Gross per 24 hour  Intake 1148.46 ml  Output 1850 ml  Net -701.54 ml   Filed Weights   09/12/22 0500 09/13/22 0500 09/14/22 0500  Weight: 102.1 kg 103.2 kg 106.2 kg   Physical Exam: General: Chronically ill-appearing, no acute distress HENT: [place], AT, OP clear, MMM Eyes: EOMI, no scleral icterus Respiratory: Diminished but clear to auscultation bilaterally.  No crackles, wheezing or rales Cardiovascular: RRR, -M/R/G, no JVD GI: BS+, soft, nontender Extremities: Left BKA, -Edema,-tenderness Neuro: AAO x4, CNII-XII grossly intact Psych: Depressed mood, flat affect GU: Foley in place   Stable anemia Aki improcing  Resolved Hospital Problem list   Hypophosphatemia NAGMA  Assessment & Plan:   Shock, initially suspected septic from UTI, now looking more likely cardiogenic shock. All cultures have remained negative this admission and he has been on antibiotics for weeks pre-dating this admit. Acute drop in EF, low coox. However he has been autodiuresing to the point of AKI  Acute on chronic HFrEF, ICM, CAD with stent in 05/2022 > 09/03/22 TTE> EF now 30-35, G2DD  pAF Anasarca - resolved Lactic acidosis, secondary to above -appreciate Cardiology assistance >  not a candidate for home inotropes, revascularization, transplant or LVAD. Not candidate for GDMT due to ongoing hypotension. Team agrees with maintaining net even in setting of auto-diuresis -Will obtain urine studies -change levophed parameters to SBP >90 or MAP >60  -midodrine '15mg'$  TID  -continue hydrocortisone  '100mg'$  Q8, wean to off once off pressors  -intermittent Co-Ox  -continue eliquis & plavix  Prostatitis Scrotal Cellulitis H/O ESBL E.Coli and CRE Proteus -appreciate ID  -continue ertapenem per ID, 4-6 weeks treatment for prostatitis  -f/u in [place] with ID, has established care   Hematuria due to UTI Known prostate cancer, under surveillance with urology -continue eliquis, plavix  -follow H/H  -keep foley in place until seen by Urology after discharge (per last discussion with urology / they were ok with voiding trial if he was ambulatory, otherwise keep until seen in office) -continue eliquis, plavix  -continue flomax, finasteride   Hyponatremia  Mild AKI - improving Hypocalcemia  -LR bolus -replace Ca+, 1gm 11/24  -follow sodium trend    DM2 -levemir 26 units BID  -SSI, moderate scale  -HD sliding scale  -meal coverage 8 units TID    Deconditioning -PT/OT  -use prosthesis, mobilize as able    Chronic Stage 2 sacral ulcer -wound care per protocol  Insomnia, phantom limb pain LLE -reduce QHS gabapentin to 300 mg QHS with hypotension -melatonin, trazodone, ambien QHS PRN   Chronic Critical Illness  -palliative care input appreciated. Patient acknowledges current medical measures have not changed his QOL but still wants to continue current measures -DNR status   Best Practice (right click and "Reselect all SmartList Selections" daily)  Diet/type: Regular consistency > carb modified/ heart healthy DVT prophylaxis: Eliquis GI prophylaxis: PPI   Lines: PICC Foley: yes and it is still needed Code Status:  DNR   Critical care time: 33 minutes   The patient is critically ill with multiple organ systems failure and requires high complexity decision making for assessment and support, frequent evaluation and titration of therapies, application of advanced monitoring technologies and extensive interpretation of multiple databases.    Rodman Pickle, M.D. [facility]  Pulmonary/Critical Care Medicine 09/14/2022 11:11 AM   Please see Amion for pager number to reach on-call Pulmonary and Critical Care Team.

## 2022-09-14 NOTE — Progress Notes (Signed)
Progress Note  Patient Name: Peter Yang Date of Encounter: 09/14/2022  Primary Cardiologist: Pixie Casino, MD  Subjective   No chest pain or shortness of breath at rest.  States that he slept better last night with sleeping aids.  Inpatient Medications    Scheduled Meds:  apixaban  5 mg Oral BID   atorvastatin  80 mg Oral Daily   Chlorhexidine Gluconate Cloth  6 each Topical Daily   clopidogrel  75 mg Oral Daily   finasteride  5 mg Oral Daily   gabapentin  300 mg Oral QHS   hydrocortisone sod succinate (SOLU-CORTEF) inj  100 mg Intravenous Q8H   insulin aspart  0-15 Units Subcutaneous TID WC   insulin aspart  0-5 Units Subcutaneous QHS   insulin aspart  8 Units Subcutaneous TID WC   insulin detemir  26 Units Subcutaneous BID   lidocaine  1 patch Transdermal Daily   melatonin  5 mg Oral QHS   midodrine  15 mg Oral TID with meals   nystatin   Topical BID   pantoprazole  40 mg Oral Daily   sodium chloride flush  10-40 mL Intracatheter Q12H   tamsulosin  0.4 mg Oral Daily   traZODone  50 mg Oral QHS   Continuous Infusions:  sodium chloride 250 mL (09/14/22 0248)   sodium chloride 10 mL/hr at 09/11/22 0431   sodium chloride     ertapenem Stopped (09/13/22 1545)   lactated ringers     norepinephrine (LEVOPHED) Adult infusion 3 mcg/min (09/14/22 0700)   PRN Meds: sodium chloride, Place/Maintain arterial line **AND** sodium chloride, acetaminophen **OR** acetaminophen, diclofenac Sodium, mouth rinse, polyvinyl alcohol, sodium chloride flush, zolpidem   Vital Signs    Vitals:   09/14/22 0715 09/14/22 0730 09/14/22 0745 09/14/22 0800  BP: 96/66 102/65 (!) 107/43 (!) 98/53  Pulse: 62 81 66 67  Resp: (!) '4 17 12 12  '$ Temp:      TempSrc:      SpO2: 95% 99% 99% 98%  Weight:      Height:        Intake/Output Summary (Last 24 hours) at 09/14/2022 0841 Last data filed at 09/14/2022 0800 Gross per 24 hour  Intake 1148.46 ml  Output 1850 ml  Net -701.54 ml    Filed Weights   09/12/22 0500 09/13/22 0500 09/14/22 0500  Weight: 102.1 kg 103.2 kg 106.2 kg    Telemetry    Sinus rhythm.  Personally reviewed.  ECG    No new tracing reviewed today.  Physical Exam   GEN: No acute distress.   Neck: No JVD. Cardiac: RRR, no gallop.  Respiratory: Nonlabored. Clear to auscultation bilaterally. GI: Soft, bowel sounds present. MS: No edema.  Labs    Chemistry Recent Labs  Lab 09/10/22 0521 09/11/22 0445 09/11/22 1200 09/12/22 0359 09/13/22 0340 09/14/22 0531  NA 130*   < >  --  130* 130* 130*  K 4.2   < >  --  4.5 3.9 4.3  CL 99   < >  --  99 104 103  CO2 24   < >  --  22 20* 20*  GLUCOSE 243*   < >  --  262* 288* 202*  BUN 28*   < >  --  43* 46* 47*  CREATININE 0.89   < >  --  1.26* 1.14 1.10  CALCIUM 8.1*   < >  --  8.6* 7.7* 8.3*  PROT  --   --  5.5*  --   --   --   ALBUMIN 2.5*  --  2.6*  --   --   --   AST  --   --  17  --   --   --   ALT  --   --  23  --   --   --   ALKPHOS  --   --  78  --   --   --   BILITOT  --   --  0.7  --   --   --   GFRNONAA >60   < >  --  >60 >60 >60  ANIONGAP 7   < >  --  '9 6 7   '$ < > = values in this interval not displayed.     Hematology Recent Labs  Lab 09/11/22 0445 09/12/22 0359 09/14/22 0531  WBC 5.1 8.7 8.1  RBC 3.00* 3.06* 3.09*  HGB 7.6* 7.6* 7.7*  HCT 25.1* 25.2* 25.5*  MCV 83.7 82.4 82.5  MCH 25.3* 24.8* 24.9*  MCHC 30.3 30.2 30.2  RDW 16.3* 16.5* 16.6*  PLT 207 249 226    Cardiac Enzymes Recent Labs  Lab 09/01/22 0040 09/02/22 1935  TROPONINIHS 263* 771*    Radiology    No results found.  Cardiac Studies   Echocardiogram 09/03/2022:  1. Left ventricular ejection fraction, by estimation, is 30 to 35%. The  left ventricle has moderately decreased function. The left ventricle  demonstrates regional wall motion abnormalities (see scoring  diagram/findings for description). The left  ventricular internal cavity size was mildly dilated. Left ventricular   diastolic parameters are consistent with Grade II diastolic dysfunction  (pseudonormalization).   2. Right ventricular systolic function is moderately reduced. The right  ventricular size is mildly enlarged. There is mildly elevated pulmonary  artery systolic pressure. The estimated right ventricular systolic  pressure is 17.0 mmHg.   3. Left atrial size was moderately dilated.   4. Right atrial size was mildly dilated.   5. The mitral valve is abnormal. Moderate mitral valve regurgitation. No  evidence of mitral stenosis.   6. The aortic valve is tricuspid. There is mild calcification of the  aortic valve. Aortic valve regurgitation is not visualized. Aortic valve  sclerosis is present, with no evidence of aortic valve stenosis.   7. Aortic dilatation noted. There is mild dilatation of the aortic root,  measuring 42 mm.   8. The inferior vena cava is normal in size with <50% respiratory  variability, suggesting right atrial pressure of 8 mmHg.   Assessment & Plan    1.  HFrEF, acute on chronic with component of cardiogenic shock although does look to be mixed picture.  He is auto diuresing with negative net urine output each day off diuretics.  He is on midodrine for blood pressure support and has required low-dose Levophed including resumption this morning.  Presumably ischemic cardiomyopathy based on CAD history with poor revascularization options and patient not felt to be candidate for home inotropes or advanced heart failure therapies.  2.  Paroxysmal atrial fibrillation.  Currently on Eliquis for stroke prophylaxis.  CHA2DS2-VASc score is 4.  Currently in sinus rhythm.  3.  Multivessel CAD with limited revascularization options as discussed in prior notes.  No obvious angina at this time.  Currently on Plavix and Lipitor.  Chart reviewed including plan by Dr. Marlou Porch.  Would try and keep intake and output even, trial of IV fluids would be reasonable, hold off  diuretics.  Check urine  studies.  Continue midodrine, try and wean off Levophed as tolerated.  Palliative care team has seen with ongoing discussions regarding [place]. He is currently DNR.  Signed, Rozann Lesches, MD  09/14/2022, 8:41 AM

## 2022-09-14 NOTE — Progress Notes (Signed)
eLink Physician-Brief Progress Note Patient Name: Peter Yang DOB: [birth year] MRN: [id number]   Date of Service  09/14/2022  HPI/Events of Note  Pt hypotensive, with SBP 90s, MAP 50s.  PT sleeping, and had just received midodrine.  BP has been borderline low throughout the day, and he is reported to be on his third bolus.  I/O indicate he is negative 14L, but it is unclear how accurate this is (CVP 18 currently)  eICU Interventions  Will finish current bolus.  If pt's BP not responsive to fluids, then will plan to restart norepinephrine drip.         [place] 09/14/2022, 9:02 PM

## 2022-09-14 NOTE — Progress Notes (Signed)
RT tried 2 attempts at putting a A-line in the patient's Left arm. Both attempts failed. Dr. Loanne Drilling aware.

## 2022-09-15 DIAGNOSIS — R570 Cardiogenic shock: Secondary | ICD-10-CM | POA: Diagnosis not present

## 2022-09-15 DIAGNOSIS — R310 Gross hematuria: Secondary | ICD-10-CM | POA: Diagnosis not present

## 2022-09-15 DIAGNOSIS — N179 Acute kidney failure, unspecified: Secondary | ICD-10-CM | POA: Diagnosis not present

## 2022-09-15 DIAGNOSIS — N410 Acute prostatitis: Secondary | ICD-10-CM | POA: Diagnosis not present

## 2022-09-15 DIAGNOSIS — I5023 Acute on chronic systolic (congestive) heart failure: Secondary | ICD-10-CM

## 2022-09-15 DIAGNOSIS — Z7189 Other specified counseling: Secondary | ICD-10-CM

## 2022-09-15 DIAGNOSIS — Z515 Encounter for palliative care: Secondary | ICD-10-CM | POA: Diagnosis not present

## 2022-09-15 DIAGNOSIS — I5020 Unspecified systolic (congestive) heart failure: Secondary | ICD-10-CM | POA: Diagnosis not present

## 2022-09-15 LAB — GLUCOSE, CAPILLARY
Glucose-Capillary: 195 mg/dL — ABNORMAL HIGH (ref 70–99)
Glucose-Capillary: 164 mg/dL — ABNORMAL HIGH (ref 70–99)
Glucose-Capillary: 217 mg/dL — ABNORMAL HIGH (ref 70–99)
Glucose-Capillary: 224 mg/dL — ABNORMAL HIGH (ref 70–99)
Glucose-Capillary: 220 mg/dL — ABNORMAL HIGH (ref 70–99)
Glucose-Capillary: 221 mg/dL — ABNORMAL HIGH (ref 70–99)

## 2022-09-15 LAB — COOXEMETRY PANEL
Carboxyhemoglobin: 2.2 % — ABNORMAL HIGH (ref 0.5–1.5)
Methemoglobin: <0.7 % (ref 0.0–1.5)
O2 Saturation: 62.8 %
Total hemoglobin: 8 g/dL — ABNORMAL LOW (ref 12.0–16.0)

## 2022-09-15 LAB — BASIC METABOLIC PANEL
Anion gap: 6 (ref 5–15)
BUN: 45 mg/dL — ABNORMAL HIGH (ref 8–23)
CO2: 22 mmol/L (ref 22–32)
Calcium: 8.3 mg/dL — ABNORMAL LOW (ref 8.9–10.3)
Chloride: 104 mmol/L (ref 98–111)
Creatinine, Ser: 1.02 mg/dL (ref 0.61–1.24)
GFR, Estimated: >60 mL/min (ref >60)
Glucose, Bld: 210 mg/dL — ABNORMAL HIGH (ref 70–99)
Potassium: 4.3 mmol/L (ref 3.5–5.1)
Sodium: 132 mmol/L — ABNORMAL LOW (ref 135–145)

## 2022-09-15 LAB — TSH: TSH: 3.797 u[IU]/mL (ref 0.350–4.500)

## 2022-09-15 LAB — T4, FREE: Free T4: 0.74 ng/dL (ref 0.61–1.12)

## 2022-09-15 LAB — OSMOLALITY, URINE
Osmolality, Ur: 523 mOsm/kg (ref 300–900)
Osmolality, Ur: 552 mOsm/kg (ref 300–900)

## 2022-09-15 LAB — SODIUM, URINE, RANDOM: Sodium, Ur: 11 mmol/L

## 2022-09-15 MED ORDER — ACETAMINOPHEN 500 MG PO TABS
1000.0000 mg | ORAL_TABLET | Freq: Three times a day (TID) | ORAL | Status: DC
  Administered 2022-09-15 – 2022-10-02 (×46): 1000 mg via ORAL
  Filled 2022-09-15 (×49): qty 2

## 2022-09-15 MED ORDER — SODIUM CHLORIDE 0.9 % IV BOLUS
1000.0000 mL | Freq: Once | INTRAVENOUS | Status: AC
  Administered 2022-09-15: 1000 mL via INTRAVENOUS

## 2022-09-15 MED ORDER — OXYCODONE HCL 5 MG PO TABS
5.0000 mg | ORAL_TABLET | ORAL | Status: DC | PRN
  Administered 2022-09-15 – 2022-10-24 (×62): 5 mg via ORAL
  Filled 2022-09-15 (×63): qty 1

## 2022-09-15 MED ORDER — OXYCODONE HCL 5 MG PO TABS: 5.0000 mg | ORAL_TABLET | Freq: Four times a day (QID) | ORAL | Status: DC | PRN

## 2022-09-15 MED ORDER — SODIUM CHLORIDE 0.9 % IV SOLN: INTRAVENOUS | Status: AC

## 2022-09-15 NOTE — Progress Notes (Signed)
Daily Progress Note   Patient Name: Peter Yang       Date: 09/15/2022 DOB: 04-23-1956  Age: 66 y.o. MRN#: [id number] Attending Physician: Margaretha Seeds, MD Primary Care Physician: Clovia Cuff, MD Admit Date: 08/31/2022 Length of Stay: 14 days  Reason for Consultation/Follow-up: Establishing goals of care and symptom management  Subjective:   CC: Patient seen in afternoon after working with PT. Following up regarding complex medical decision making.   Subjective:  Discussed care with bedside RN prior to seeing patient.  Patient off levophed now after IVF bolus. Had to go back on levophed overnight.    Presented to bedside and patient agreeing with visit.  Discussed baby step of hopefully keeping patient off Levophed which has been the goal.  Patient hopeful to participate with PT tomorrow.  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  Followed up regarding patient's symptoms.  Patient did feel that having his medications to assist with insomnia scheduled earlier in the day did allow him to get better sleep last night.  Wants to continue with this intervention.  Provider had also added oxycodone low-dose yesterday to assist with pain management.  Patient noting that the oxycodone 2.5 mg does help with pain though still room for improvement.  We discussed slowly increasing dose to 5 mg every 4 hours at this time to assist with appropriate pain management.  Followed up regarding discussions already had with patient regarding his wishes for medical care moving forward.  Patient noted that he is still considering what we talked about and would like some more time to think about it.  Acknowledged this and again discussed idea of pain with appropriate medical management though not escalating care further such as continuing to add on pressors knowing that it would not reverse the damage done to his heart.  Continue on appropriate  interventions such as oral midodrine.  Again patient wanting some time to consider this further.  Answered all questions at that time.  Thanked patient for this provider and able to visit with him today.  Review of Systems Denies any symptoms of concern at this time.  Objective:   Vital Signs:  BP (!) 118/50   Pulse 67   Temp (!) 97.3 F (36.3 C) (Axillary)   Resp 13   Ht '6\' 4"'$  (1.93 m)   Wt 106.2 kg   SpO2 95%   BMI 28.50 kg/m   Physical Exam: General: Awake, alert, interactive, chronically ill appearing  Eyes: No drainage noted HENT: moist mucous membranes Cardiovascular: RRR Respiratory: no increased work of breathing noted, not in respiratory distress Extremities: L BKA Skin: no rashes or lesions on visible skin Psych: more interactive and welcoming of visits  Imaging: I personally reviewed recent imaging.   Assessment & Plan:   Assessment: 66 year old man, from [place], admitted to the hospital 08/31/2022 because of hematuria. He has a past medical history of CAD/NSTEMI, AAA, TIA, PAF on Eliquis, diabetes, hypertension, heart failure with reduced ejection fraction left ventricular ejection fraction 40-45%, history of recent COVID. Patient admitted to hospital medicine service because of a complicated UTI and hematuria, cultures positive for ESBL, antibiotics adjusted, urology also consulted for scrotal erythema and cellulitis, remains on antibiotics, hospital course complicated by ongoing hypotension requiring peripheral Levophed and Dobutamine support. Patient also on intravenous heparin infusion. PMT consulted for goals of care discussions.   Recommendations/Plan: # Complex medical decision making/goals of care:  -Patient has  previously stated that his quality of life would be continuing to do things he was doing prior to hospitalization such as bass fishing.  Have continued to express concern that his heart can no longer keep up with the  activities he used to do prior.  PMT provider continuing to progress in conversations daily.  -Patient completed ACP documentation with chaplain on 09/13/2022.  -Patient provided the name of 2 friends that can be contacted only if he dies.  Names are: Monico Hoar number [phone] in [place] and Luan Pulling number [phone] in [place].  Encourage patient that when he completes ACP documentation, he should think about who he would want contacted if he is unable to answer medical questions though has not reached the state of death yet such as being in a coma.  Patient to consider this.  -  Code Status: DNR  # Symptom management:  Pain   -Patient is on Neurontin, lidocaine patch.     -Would consider increase of Neurontin next.   -Increased oxycodone to '5mg'$  q6hrs prn.    -Scheduled Tylenol '1000mg'$  TID.    Insomnia   -Continue trazodone, zolpidem, melatonin, and gabapentin with earlier dosing.   # Psychosocial Support:  -No next of kin, no family, no HCPOA, does not want friends contacted.     -Patient willing to provide the name of 2 friends that can be contacted only if he dies.  Names are: Monico Hoar number [phone] in [place] and Luan Pulling number [phone] in [place].    # Discharge Planning: remains in ICU due to on/off vasopressor requirements  Discussed with: bedside RN, patient  Thank you for allowing the palliative care team to participate in the care [name].  Chelsea Aus, DO Palliative Care Provider PMT # [phone]

## 2022-09-15 NOTE — Progress Notes (Signed)
Progress Note  Patient Name: Peter Yang Date of Encounter: 09/15/2022  Primary Cardiologist: Pixie Casino, MD  Chart reviewed and case discussed with Dr. Loanne Drilling regarding course since yesterday.  Patient did respond to IV fluids with more stable blood pressure, was able to come off norepinephrine as well although recently started back by eICU team.  Recent systolics running 78G to 956O.  Intake and output more even last 24 hours with spontaneous diuresis.  Urine studies suggested relative hypovolemia.  Inpatient Medications    Scheduled Meds:  apixaban  5 mg Oral BID   atorvastatin  80 mg Oral Daily   Chlorhexidine Gluconate Cloth  6 each Topical Daily   clopidogrel  75 mg Oral Daily   finasteride  5 mg Oral Daily   gabapentin  300 mg Oral q1800   hydrocortisone sod succinate (SOLU-CORTEF) inj  100 mg Intravenous Q8H   insulin aspart  0-15 Units Subcutaneous TID WC   insulin aspart  0-5 Units Subcutaneous QHS   insulin aspart  8 Units Subcutaneous TID WC   insulin detemir  26 Units Subcutaneous BID   lidocaine  1 patch Transdermal Daily   melatonin  5 mg Oral q1800   midodrine  15 mg Oral TID with meals   nystatin   Topical BID   pantoprazole  40 mg Oral Daily   sodium chloride flush  10-40 mL Intracatheter Q12H   tamsulosin  0.4 mg Oral Daily   traZODone  50 mg Oral q1800   zolpidem  5 mg Oral q1800   Continuous Infusions:  sodium chloride 250 mL (09/14/22 0248)   sodium chloride 10 mL/hr at 09/11/22 0431   sodium chloride     ertapenem Stopped (09/14/22 1609)   norepinephrine (LEVOPHED) Adult infusion 2 mcg/min (09/14/22 2241)   PRN Meds: sodium chloride, Place/Maintain arterial line **AND** sodium chloride, acetaminophen **OR** acetaminophen, diclofenac Sodium, mouth rinse, oxyCODONE, polyvinyl alcohol, sodium chloride flush   Vital Signs    Vitals:   09/15/22 0645 09/15/22 0700 09/15/22 0800 09/15/22 0900  BP: 105/66 118/68  (!) 118/50  Pulse: 64 (!)  59 70 67  Resp: '16 12 15 13  '$ Temp:   (!) 97.3 F (36.3 C)   TempSrc:   Axillary   SpO2: 100% 97% 100% 95%  Weight:      Height:        Intake/Output Summary (Last 24 hours) at 09/15/2022 0919 Last data filed at 09/14/2022 2300 Gross per 24 hour  Intake 1162.76 ml  Output 1150 ml  Net 12.76 ml   Filed Weights   09/12/22 0500 09/13/22 0500 09/14/22 0500  Weight: 102.1 kg 103.2 kg 106.2 kg    Telemetry    Sinus rhythm.  Personally reviewed.  ECG    No new tracing reviewed today.  Labs    Chemistry Recent Labs  Lab 09/10/22 0521 09/11/22 0445 09/11/22 1200 09/12/22 0359 09/13/22 0340 09/14/22 0531 09/15/22 0621  NA 130*   < >  --    < > 130* 130* 132*  K 4.2   < >  --    < > 3.9 4.3 4.3  CL 99   < >  --    < > 104 103 104  CO2 24   < >  --    < > 20* 20* 22  GLUCOSE 243*   < >  --    < > 288* 202* 210*  BUN 28*   < >  --    < >  46* 47* 45*  CREATININE 0.89   < >  --    < > 1.14 1.10 1.02  CALCIUM 8.1*   < >  --    < > 7.7* 8.3* 8.3*  PROT  --   --  5.5*  --   --   --   --   ALBUMIN 2.5*  --  2.6*  --   --   --   --   AST  --   --  17  --   --   --   --   ALT  --   --  23  --   --   --   --   ALKPHOS  --   --  78  --   --   --   --   BILITOT  --   --  0.7  --   --   --   --   GFRNONAA >60   < >  --    < > >60 >60 >60  ANIONGAP 7   < >  --    < > '6 7 6   '$ < > = values in this interval not displayed.     Hematology Recent Labs  Lab 09/11/22 0445 09/12/22 0359 09/14/22 0531  WBC 5.1 8.7 8.1  RBC 3.00* 3.06* 3.09*  HGB 7.6* 7.6* 7.7*  HCT 25.1* 25.2* 25.5*  MCV 83.7 82.4 82.5  MCH 25.3* 24.8* 24.9*  MCHC 30.3 30.2 30.2  RDW 16.3* 16.5* 16.6*  PLT 207 249 226    Cardiac Enzymes Recent Labs  Lab 09/01/22 0040 09/02/22 1935  TROPONINIHS 263* 771*    Radiology    No results found.  Cardiac Studies   Echocardiogram 09/03/2022:  1. Left ventricular ejection fraction, by estimation, is 30 to 35%. The  left ventricle has moderately decreased  function. The left ventricle  demonstrates regional wall motion abnormalities (see scoring  diagram/findings for description). The left  ventricular internal cavity size was mildly dilated. Left ventricular  diastolic parameters are consistent with Grade II diastolic dysfunction  (pseudonormalization).   2. Right ventricular systolic function is moderately reduced. The right  ventricular size is mildly enlarged. There is mildly elevated pulmonary  artery systolic pressure. The estimated right ventricular systolic  pressure is 54.6 mmHg.   3. Left atrial size was moderately dilated.   4. Right atrial size was mildly dilated.   5. The mitral valve is abnormal. Moderate mitral valve regurgitation. No  evidence of mitral stenosis.   6. The aortic valve is tricuspid. There is mild calcification of the  aortic valve. Aortic valve regurgitation is not visualized. Aortic valve  sclerosis is present, with no evidence of aortic valve stenosis.   7. Aortic dilatation noted. There is mild dilatation of the aortic root,  measuring 42 mm.   8. The inferior vena cava is normal in size with <50% respiratory  variability, suggesting right atrial pressure of 8 mmHg.   Assessment & Plan    1.  HFrEF, acute on chronic with component of cardiogenic shock although does look to be mixed picture.  He remains on midodrine for blood pressure support and has required low-dose Levophed, although has responded to IV fluids as well and urine studies suggest relative hypovolemia.  Presumably ischemic cardiomyopathy based on CAD history with poor revascularization options and patient not felt to be candidate for home inotropes or advanced heart failure therapies.  2.  Paroxysmal atrial fibrillation.  Currently on  Eliquis for stroke prophylaxis.  CHA2DS2-VASc score is 4.  Currently in sinus rhythm.  3.  Multivessel CAD with limited revascularization options as discussed in prior notes.  No obvious angina at this time.   Currently on Plavix and Lipitor.  Would attempt to keep intake and output relatively even through addition of IV fluids.  Continue midodrine.  He is auto diuresing well and blood pressure has been more stable with IV fluids yesterday.  Try to wean off Levophed.  Currently DNR and plan per chart review has not been to pursue aggressive invasive cardiac work-up.  Signed, Rozann Lesches, MD  09/15/2022, 9:19 AM

## 2022-09-15 NOTE — Progress Notes (Signed)
Orthopedic Tech Progress Note Patient Details:  [name] [birth year] [id number]  Per Harrington Challenger, the on-call [employer] rep this weekend, the shrinker ordered 11/25 can be expected to arrive Monday 11/27 as it was a routine order.  Patient ID: Peter Yang, male   DOB: 1955/10/28, 66 y.o.   MRN: [id number]  Carin Primrose 09/15/2022, 10:09 AM

## 2022-09-15 NOTE — Progress Notes (Signed)
NAME:  Peter Yang, MRN:  [id number], DOB:  November 10, 1955, LOS: 66 ADMISSION DATE:  08/31/2022, CONSULTATION DATE:  11/11 REFERRING MD:  Lelon Mast FOR CONSULT: sepsis   History of Present Illness:  Peter Yang is an 66 y.o. M who presented to the [facility] ED with a chief complaint of hematuria.  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  Sent to the emergency department SNF staff on 11/11.  At the emergency department he developed hypotension with a BP of 83/63. WBC 14.2, lactate 5.0 > 7.9.  Code sepsis was called.  Blood cultures and urine cultures were obtained.  Patient was given fluid bolus per sepsis guidelines.  Patient with complicated infectious history-started on meropenem and amoxicillin by TRH.  Urology was consulted for hematuria and concern for forniers.  Recommends monitoring with possible cystoscopy once Eliquis washout. TRH admitted patient.  PCCM was consulted for assistance with sepsis with high lactate.  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% BPH (hx prostate CA) SNF resident  Significant Hospital Events: Including procedures, antibiotic start and stop dates in addition to other pertinent events   11/11 Admit by TRH, BC>, UC>, PCCM consult, Mero and amoxicillin> 11/12 Started on peripheral Levophed for persistent hypotension, albumin 11/13 Troponin rising from 263 to 771, DVT/PE ruled out 11/14 dobutamine started for low coox, lower EF on echo 11/15 trial of restarting heparin 11/17 off dobutamine 11/19 coox 93, remains on pressors 11/20 Coox 67, CVP 8, on 3 norepi 11/21 Coox 75.1, 4 norepi  11/23 Norepi weaned to 62mg, hypotension into 80's overnight > increased levo. Robust UOP despite no  lasix in 72 hours.   11/24 Levophed weaned off briefly yesterday and restarted overnight 11/26 Patient responsive to IVF resuscitation. Initial urine studies suggest hypovolemic hyponatremia  Interim History / Subjective:  Patient responsive to IVF resuscitation. Initial urine studies suggest hypovolemic hyponatremia.  Levophed weaned off again yesterday  Objective   Blood pressure (!) 118/50, pulse 67, temperature (!) 97.3 F (36.3 C), temperature source Axillary, resp. rate 13, height '6\' 4"'$  (1.93 m), weight 106.2 kg, SpO2 95 %. CVP:  [12 mmHg-20 mmHg] 20 mmHg      Intake/Output Summary (Last 24 hours) at 09/15/2022 0923 Last data filed at 09/14/2022 2300 Gross per 24 hour  Intake 1162.76 ml  Output 1150 ml  Net 12.76 ml   Filed Weights   09/12/22 0500 09/13/22 0500 09/14/22 0500  Weight: 102.1 kg 103.2 kg 106.2 kg   Physical Exam: General: Chronically ill-appearing, no acute distress HENT: [place], AT, OP clear, MMM Eyes: EOMI, no scleral icterus Respiratory: Clear to auscultation bilaterally.  No crackles, wheezing or rales Cardiovascular: RRR, -M/R/G, no JVD GI: BS+, soft, nontender Extremities: Left BKA, -Edema,-tenderness Neuro: AAO x4, CNII-XII grossly intact Skin: Intact, no rashes or bruising Psych: Depressed mood, normal affect GU: Foley in place  Na 132, improving Stable anemia Aki improving  Resolved Hospital Problem list   Hypophosphatemia NAGMA  Assessment & Plan:   Shock, mixed septic from UTI >cardiogenic shock with new depressed EF>hypovolemic shock d/t overdiuresis Hypovolemic hyponatremia Clinically resolved infection and on appropriate antibiotics. Echo with EF 30% with concerns for cardiogenic shock however patient net negative 15L this admission. Last dose  of diuretics stopped 11/17 however patient continued to autodiurese until he developed AKI. Patient euvolemic. Initial urine studies now suggest hypovolemic hyponatremia. Question accuracy of PICC  obtained co-ox and CVP values. 11/23-11/26 with response to IVF resuscitation. Able to wean levophed during day and requires restart overnight. Patient asymptomatic during these hypotensive episodes --Wean levophed for SBP goal >90 --IVF resuscitation --Repeat urine na, osm after fluid resuscitation today --Continue stress dose steroids --Midodrine '15mg'$  TID  --F/u TSH, T4  Acute on chronic HFrEF, ICM - new up CAD with stent in 05/2022 > 09/03/22 TTE> EF now 30-35, G2DD  pAF Euvolemic, possible mixed cardiogenic shock picture --Discussed management with Cardiology. Agree for at least net even  --Appreciate Cardiology input: not a candidate for home inotropes, revascularization, transplant or LVAD. Not candidate for GDMT due to ongoing hypotension --intermittent Co-Ox, unclear if accurate --continue eliquis & plavix  Prostatitis Scrotal Cellulitis H/O ESBL E.Coli and CRE Proteus -appreciate ID  -continue ertapenem per ID, 4-6 weeks treatment for prostatitis  -f/u in [place] with ID, has established care   Hematuria due to UTI Known prostate cancer, under surveillance with urology -continue eliquis, plavix  -follow H/H  -keep foley in place until seen by Urology after discharge (per last discussion with urology / they were ok with voiding trial if he was ambulatory, otherwise keep until seen in office) -continue eliquis, plavix  -continue flomax, finasteride   Hyponatremia, hypovolemic Mild AKI - improving Hypocalcemia  -NS bolus. May repeat -replace Ca+, 1gm 11/24  -follow sodium trend    DM2 -levemir 26 units BID  -SSI, moderate scale  -HD sliding scale  -meal coverage 8 units TID    Deconditioning -PT/OT  -use prosthesis, mobilize as able    Chronic Stage 2 sacral ulcer -wound care per protocol  Insomnia, phantom limb pain LLE -reduce QHS gabapentin to 300 mg QHS with hypotension -melatonin, trazodone, ambien QHS PRN  -Ortho tech consult for LLE sleeve    Chronic Critical Illness  -palliative care input appreciated. Patient acknowledges current medical measures have not changed his QOL but still wants to continue current measures -DNR status   Best Practice (right click and "Reselect all SmartList Selections" daily)  Diet/type: Regular consistency > carb modified/ heart healthy DVT prophylaxis: Eliquis GI prophylaxis: PPI   Lines: PICC Foley: yes and it is still needed Code Status:  DNR   Critical care time: 32 minutes   The patient is critically ill with multiple organ systems failure and requires high complexity decision making for assessment and support, frequent evaluation and titration of therapies, application of advanced monitoring technologies and extensive interpretation of multiple databases.   Rodman Pickle, M.D. [facility] Pulmonary/Critical Care Medicine 09/15/2022 9:23 AM   Please see Amion for pager number to reach on-call Pulmonary and Critical Care Team.

## 2022-09-16 DIAGNOSIS — Z515 Encounter for palliative care: Secondary | ICD-10-CM | POA: Diagnosis not present

## 2022-09-16 DIAGNOSIS — I5023 Acute on chronic systolic (congestive) heart failure: Secondary | ICD-10-CM | POA: Diagnosis not present

## 2022-09-16 DIAGNOSIS — R570 Cardiogenic shock: Secondary | ICD-10-CM | POA: Diagnosis not present

## 2022-09-16 DIAGNOSIS — R310 Gross hematuria: Secondary | ICD-10-CM | POA: Diagnosis not present

## 2022-09-16 DIAGNOSIS — N410 Acute prostatitis: Secondary | ICD-10-CM | POA: Diagnosis not present

## 2022-09-16 LAB — CBC
HCT: 24.7 % — ABNORMAL LOW (ref 39.0–52.0)
Hemoglobin: 7.3 g/dL — ABNORMAL LOW (ref 13.0–17.0)
MCH: 24.3 pg — ABNORMAL LOW (ref 26.0–34.0)
MCHC: 29.6 g/dL — ABNORMAL LOW (ref 30.0–36.0)
MCV: 82.3 fL (ref 80.0–100.0)
Platelets: 182 10*3/uL (ref 150–400)
RBC: 3 MIL/uL — ABNORMAL LOW (ref 4.22–5.81)
RDW: 16.8 % — ABNORMAL HIGH (ref 11.5–15.5)
WBC: 4.7 10*3/uL (ref 4.0–10.5)
nRBC: 0 % (ref 0.0–0.2)

## 2022-09-16 LAB — BASIC METABOLIC PANEL
Anion gap: 5 (ref 5–15)
BUN: 45 mg/dL — ABNORMAL HIGH (ref 8–23)
CO2: 20 mmol/L — ABNORMAL LOW (ref 22–32)
Calcium: 7.9 mg/dL — ABNORMAL LOW (ref 8.9–10.3)
Chloride: 106 mmol/L (ref 98–111)
Creatinine, Ser: 1.16 mg/dL (ref 0.61–1.24)
GFR, Estimated: >60 mL/min (ref >60)
Glucose, Bld: 166 mg/dL — ABNORMAL HIGH (ref 70–99)
Potassium: 4.2 mmol/L (ref 3.5–5.1)
Sodium: 131 mmol/L — ABNORMAL LOW (ref 135–145)

## 2022-09-16 LAB — GLUCOSE, CAPILLARY
Glucose-Capillary: 130 mg/dL — ABNORMAL HIGH (ref 70–99)
Glucose-Capillary: 162 mg/dL — ABNORMAL HIGH (ref 70–99)
Glucose-Capillary: 159 mg/dL — ABNORMAL HIGH (ref 70–99)
Glucose-Capillary: 120 mg/dL — ABNORMAL HIGH (ref 70–99)

## 2022-09-16 MED ORDER — GABAPENTIN 300 MG PO CAPS
300.0000 mg | ORAL_CAPSULE | Freq: Every day | ORAL | Status: DC
  Administered 2022-09-16 – 2022-10-24 (×39): 300 mg via ORAL
  Filled 2022-09-16 (×39): qty 1

## 2022-09-16 MED ORDER — DROXIDOPA 100 MG PO CAPS
100.0000 mg | ORAL_CAPSULE | Freq: Three times a day (TID) | ORAL | Status: AC
  Administered 2022-09-16 – 2022-09-19 (×11): 100 mg via ORAL
  Filled 2022-09-16 (×11): qty 1

## 2022-09-16 MED ORDER — DROXIDOPA 100 MG PO CAPS: 200.0000 mg | ORAL_CAPSULE | Freq: Three times a day (TID) | ORAL | Status: DC

## 2022-09-16 MED ORDER — ACETAMINOPHEN 325 MG PO TABS
650.0000 mg | ORAL_TABLET | Freq: Four times a day (QID) | ORAL | Status: DC | PRN
  Administered 2022-09-16 – 2022-10-23 (×5): 650 mg via ORAL
  Filled 2022-09-16 (×5): qty 2

## 2022-09-16 MED ORDER — HYDROCORTISONE 20 MG PO TABS
20.0000 mg | ORAL_TABLET | Freq: Every day | ORAL | Status: DC
  Administered 2022-09-17 – 2022-10-22 (×36): 20 mg via ORAL
  Filled 2022-09-16 (×36): qty 1

## 2022-09-16 MED ORDER — MIDODRINE HCL 5 MG PO TABS
20.0000 mg | ORAL_TABLET | Freq: Three times a day (TID) | ORAL | Status: DC
  Administered 2022-09-16 – 2022-09-22 (×18): 20 mg via ORAL
  Filled 2022-09-16 (×18): qty 4

## 2022-09-16 MED ORDER — HYDROCORTISONE 10 MG PO TABS
10.0000 mg | ORAL_TABLET | Freq: Every day | ORAL | Status: DC
  Administered 2022-09-16 – 2022-10-21 (×36): 10 mg via ORAL
  Filled 2022-09-16 (×38): qty 1

## 2022-09-16 NOTE — Progress Notes (Signed)
PT Cancellation Note  Patient Details Name: Peter Yang MRN: [id number] DOB: Jan 20, 1956   Cancelled Treatment:    Reason Eval/Treat Not Completed: Patient declined, states he just tried sitting up and did not go well, and he is waiting on the  shrinker for L residual limb.. Will check back another time. [place] Office [phone] Weekend pager-[phone]    Claretha Cooper 09/16/2022, 11:18 AM

## 2022-09-16 NOTE — Progress Notes (Signed)
NT came and advised that oral temperature was low at 97.2. RN took an axillary temp to compare and it read 96.3. RN spoke to patient and asked to place warm blankets on him to try to increase body temp. Patient was amenable. RN placed warm blankets on patient and he immediately removed them saying "take these off of me". RN educated pt on importance of warm blankets to attempt to increase body temperature but pt refuses. Pt is A x O X4.

## 2022-09-16 NOTE — Progress Notes (Signed)
Patient received as transfer from ICU. Agree with previous RNs assessment of patient. Patient resting comfortably, oriented to unit and equipment. Telemetry in place (box 1409) and confirmed with CMT. Cont to monitor

## 2022-09-16 NOTE — Progress Notes (Signed)
Daily Progress Note   Patient Name: Peter Yang       Date: 09/16/2022 DOB: [birth year]  Age: 66 y.o. MRN#: [id number] Attending Physician: Candee Furbish, MD Primary Care Physician: Clovia Cuff, MD Admit Date: 08/31/2022 Length of Stay: 15 days  Reason for Consultation/Follow-up: Establishing goals of care and symptom management  Subjective:   CC: Patient seen in afternoon after working with PT. Following up regarding complex medical decision making.   Subjective:  At time of EMR review prior to seeing patient, patient received oxycodone 5 mg x 2 doses.  Patient continues to receive gabapentin 100 mg at 1800 daily.  Cardiology and PCCM team continue meant of hypotension.  Requiring time on and off Levophed for support in addition to fluid boluses.    Presented to bedside inquired how patient was feeling today.  Patient noted he had a lot of "personal issues" going on.  Inquired if he wanted to discuss them further and he responded "maybe".  Proceeded to tell me that he feels incredibly down because his Social Security check has not come through, the power at his broken into house has been turned off, and he feels like "this is not worth it".  Tried to explore further regarding his wishes when he says "giving up" and patient notes feeling down and that he has never felt this bad in his life.  Patient tearful during interaction.  Offered emotional support.  Patient noted that though he is feeling down he is not "giving up".  Took time to rephrase what giving up means and discussed focusing on what quality time could look like moving forward.  Patient acknowledged though not wanting to discuss this further today.  Tried to inquire if patient would be willing to start medication to assist with mood management and he stated absolutely not.  Inquired about symptom management and patient notes that oxycodone 5 mg dose is helping to manage his left BKA pain.  Patient describes components of  neuropathic and somatic pain.  We discussed increasing gabapentin to add a morning dose to hopefully assist with pain management and patient agreeing to additional gabapentin dose in a.m. at this time.  Informed patient this provider was going off service today though my colleague would be present tomorrow.  Patient also enjoyed speaking with palliative care NP student, Levada Dy, who will return tomorrow and hopefully can visit with patient.  Answered all questions at that time.  Thanked patient for this provider and able to visit with him today.  Discussed care with bedside RN after seeing patient.    Review of Systems Denies any symptoms of concern at this time.  Objective:   Vital Signs:  BP 105/64   Pulse 68   Temp 97.7 F (36.5 C) (Oral)   Resp 13   Ht '6\' 4"'$  (1.93 m)   Wt 106.2 kg   SpO2 100%   BMI 28.50 kg/m   Physical Exam: General: Awake, alert, interactive, chronically ill appearing  Eyes: No drainage noted HENT: moist mucous membranes Cardiovascular: RRR Respiratory: no increased work of breathing noted, not in respiratory distress Extremities: L BKA Skin: no rashes or lesions on visible skin Psych: Tearful with discussing difficult topics  Imaging: I personally reviewed recent imaging.   Assessment & Plan:   Assessment: 65 year old man, from [place], admitted to the hospital 08/31/2022 because of hematuria. He has a past medical history of CAD/NSTEMI, AAA, TIA, PAF on Eliquis, diabetes, hypertension, heart failure with reduced ejection fraction  left ventricular ejection fraction 40-45%, history of recent COVID. Patient admitted to hospital medicine service because of a complicated UTI and hematuria, cultures positive for ESBL, antibiotics adjusted, urology also consulted for scrotal erythema and cellulitis, remains on antibiotics, hospital course complicated by ongoing hypotension requiring peripheral Levophed and Dobutamine support. Patient  also on intravenous heparin infusion. PMT consulted for goals of care discussions.   Recommendations/Plan: # Complex medical decision making/goals of care:  -Patient has previously stated that his quality of life would be continuing to do things he was doing prior to hospitalization such as bass fishing.  Have continued to express concern that his heart can no longer keep up with the activities he used to do prior.  PMT provider continuing to progress in conversations daily to explore how to best support quality of life moving forward based on current medical conditions.  -Patient completed ACP documentation with chaplain on 09/13/2022.  -Patient provided the name of 2 friends that can be contacted only if he dies.  Names are: Monico Hoar number [phone] in [place] and Luan Pulling number [phone] in [place].  Encourage patient that when he completes ACP documentation, he should think about who he would want contacted if he is unable to answer medical questions though has not reached the state of death yet such as being in a coma.  Patient to consider this.  -  Code Status: DNR  # Symptom management:  Pain   -Increase gabapentin to 300 mg twice daily.  Renal function reviewed prior to initiation. -Continue lidocaine patch.   -Continue oxycodone to '5mg'$  q6hrs prn.    -Scheduled Tylenol '1000mg'$  TID.    Insomnia   -Continue trazodone, zolpidem, melatonin, and gabapentin with earlier dosing.   # Psychosocial Support:  -No next of kin, no family, no HCPOA, does not want friends contacted.     -Patient willing to provide the name of 2 friends that can be contacted only if he dies.  Names are: Monico Hoar number [phone] in [place] and Luan Pulling number [phone] in [place].    # Discharge Planning: remains in ICU due to on/off vasopressor requirements  Discussed with: bedside RN, patient  Thank you for allowing the palliative care team to participate in the care  [name].  Chelsea Aus, DO Palliative Care Provider PMT # [phone]

## 2022-09-16 NOTE — Progress Notes (Signed)
NAME:  Usama Harkless, MRN:  [id number], DOB:  08/06/1956, LOS: 26 ADMISSION DATE:  08/31/2022, CONSULTATION DATE:  08/31/2022 REFERRING MD:  Alcario Drought - TRH, REASON FOR CONSULT: Sepsis   History of Present Illness:  66 year old man who presented to the [facility] ED 11/11 with a chief complaint of hematuria. PMHx significant for HTN, HLD, HFrEF/ICM EF 40-45%, CAD/NSTEMI, TIA, PAF (on Eliquis), T2DM, BPH (hx prostate CA).  Patient with questionable length of time of hematuria which may have been going on since 10/13 per patient.  Sent to the emergency department SNF staff on 11/11.  At the emergency department he developed hypotension with a BP 83/63. WBC 14.2, LA 5.0 > 7.9. Code Sepsis was called. Blood cultures and urine cultures were obtained. Patient was given fluid bolus per sepsis guidelines.  Patient with complicated infectious history-started on meropenem and amoxicillin by TRH.  Urology was consulted for hematuria and concern for Fournier's.  Recommends monitoring with possible cystoscopy once Eliquis washout. TRH admitted patient.  PCCM was consulted for assistance with sepsis with high lactate.  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% BPH (hx prostate CA) SNF resident  Significant Hospital Events: Including procedures, antibiotic start and stop dates in addition to other pertinent events   11/11 Admit by TRH, BC>, UC>, PCCM consult, Mero and amoxicillin> 11/12 Started on peripheral Levophed for persistent hypotension, albumin 11/13 Troponin rising from 263 to 771, DVT/PE ruled out 11/14 dobutamine started for low coox, lower EF on echo 11/15 trial of restarting heparin 11/17 off dobutamine 11/19 coox 93, remains on pressors 11/20 Coox 67, CVP 8, on 3 norepi 11/21 Coox 75.1, 4 norepi  11/23 Norepi weaned to 41mg, hypotension into 80's overnight > increased levo. Robust UOP despite no lasix in 72 hours.   11/24 Levophed  weaned off briefly yesterday and restarted overnight 11/26 Patient responsive to IVF resuscitation. Initial urine studies suggest hypovolemic hyponatremia 11/27 Ongoing mild hypotension, goal MAP 60. Remains on NE 2173m. Midodrine increased, droxidopa added. Mild persistent hematuria with clots.  Interim History / Subjective:  No significant events overnight Persistent mild hypotension, goal MAP 60 Remains on NE 73m46mMidodrine increased, droxidopa added Ongoing hematuria with clots +debris  Objective   Blood pressure (!) 128/103, pulse 65, temperature (!) 97.5 F (36.4 C), temperature source Oral, resp. rate 12, height '6\' 4"'$  (1.93 m), weight 106.2 kg, SpO2 100 %. CVP:  [13 mmHg-85 mmHg] 18 mmHg      Intake/Output Summary (Last 24 hours) at 09/16/2022 0902 Last data filed at 09/16/2022 0600 Gross per 24 hour  Intake 2449.42 ml  Output 2285 ml  Net 164.42 ml    Filed Weights   09/12/22 0500 09/13/22 0500 09/14/22 0500  Weight: 102.1 kg 103.2 kg 106.2 kg   Physical Examination: General: Chronically ill-appearing middle-aged man in NAD. HEENT: [place]/AT, anicteric sclera, PERRL, moist mucous membranes. Neuro: Awake, oriented x 4. Responds to verbal stimuli. Following commands consistently. Moves all 4 extremities spontaneously. Generalized weakness. CV: RRR, no m/g/r. PULM: Breathing even and unlabored on RA. Lung fields CTAB. GI: Soft, nontender, nondistended. Normoactive bowel sounds. GU: Foley in place with amber-colored urine with +clots/debris in tubing. Extremities: No significant LE edema noted. L AKA noted. Skin: Warm/dry, no rashes.  Resolved Hospital Problem list   Hypophosphatemia NAGMA  Assessment & Plan:   Shock, mixed septic from UTI >cardiogenic shock with new depressed EF>hypovolemic shock d/t overdiuresis Hypovolemic hyponatremia Clinically resolved infection and  on appropriate antibiotics. Echo with EF 30% with concerns for cardiogenic shock however patient  net negative 15L this admission. Last dose of diuretics stopped 11/17 however patient continued to autodiurese until he developed AKI. Patient euvolemic. Initial urine studies now suggest hypovolemic hyponatremia. Question accuracy of PICC obtained co-ox and CVP values. 11/23-11/26 with response to IVF resuscitation. Able to wean levophed during day and required restart overnight. Patient asymptomatic during these hypotensive episodes - Goal MAP > 60 - Fluid resuscitation as tolerated - Levophed titrated to goal MAP - Continue midodrine, droxidopa to allow for NE weaning - Hydrocortisone dose decrease to '20mg'$  daily/'10mg'$  QHS  Acute on chronic HFrEF, ICM - new up CAD with stent in 05/2022 > 09/03/22 TTE> EF now 30-35, G2DD  pAF Euvolemic, possible mixed cardiogenic shock picture - Cardiology consulted; not a candidate for home inotropes, revascularization, transplant or LVAD - Unable to tolerate GDMT due to persistent hypotension - Trend Co-ox, though accuracy of this unclear - Continue Plavix - Continue Eliquis  Prostatitis Scrotal Cellulitis H/O ESBL E.Coli and CRE Proteus - ID consulted, appreciate recs - Ertapenem x 4-6 weeks for prostatitis - Outpatient ID f/u, established in [place]   Hematuria due to UTI Known prostate cancer, under surveillance with urology - Trend H&H - Keep Foley until cleared by Urology post-discharge - Continue Eliquis/Plavix, monitor closely in the setting of hematuria - Continue Flomax, Proscar  Hyponatremia, hypovolemic Mild AKI - improving Hypocalcemia  - Trend BMP - Replete electrolytes as indicated - Monitor I&Os - Avoid nephrotoxic agents as able - Ensure adequate renal perfusion   DM2 - Basal Levemir 26U BID - Meal coverage 8U TID - SSI - CBGs Q4H - Goal CBG 140-180   Deconditioning - PT/OT while in-house, may benefit from HH/SNF - Utilize prosthesis as able - TOC consulted; patient does NOT want to return [facility] prior SN[facility]     Chronic Stage 2 sacral ulcer, POA - WOC  Insomnia, phantom limb pain LLE - Continue reduced-dose gabapentin - Continue melatonin, trazodone, Ambien QHS PRN - Ortho tech consult for LLE sleeve  Chronic Critical Illness  - PMT consulted, appreciate recommendations - Patient acknowledges medical measures have not changed QOL - Continues to desire current scope of care - DNR code status  Best Practice (right click and "Reselect all SmartList Selections" daily)  Diet/type: Regular consistency > carb modified/ heart healthy DVT prophylaxis: Eliquis GI prophylaxis: PPI   Lines: PICC Foley: yes and it is still needed Code Status:  DNR   Critical care time: 36 minutes   Lestine Mount, PA-C [place] [facility] & [facility] 09/16/22 10:04 AM  Please see [email or web address] for pager details.  From 7A-7P if no response, please call [phone] After hours, please call ELink [phone]

## 2022-09-16 NOTE — Progress Notes (Addendum)
Rounding Note    Patient Name: Peter Yang Date of Encounter: 09/16/2022  [place] Cardiologist: Pixie Casino, MD   Subjective   No acute overnight events. He was able to be weaned off Levophed yesterday. Still requiring Midodrine for his BP. He denies any shortness of breath or chest pain this morning. Still looks like he is having some hematuria. Hemoglobin down slightly from 7.7 on 11/25 to 7.3 today.  Inpatient Medications    Scheduled Meds:  acetaminophen  1,000 mg Oral Q8H   apixaban  5 mg Oral BID   atorvastatin  80 mg Oral Daily   Chlorhexidine Gluconate Cloth  6 each Topical Daily   clopidogrel  75 mg Oral Daily   droxidopa  200 mg Oral TID WC   finasteride  5 mg Oral Daily   gabapentin  300 mg Oral q1800   hydrocortisone  20 mg Oral Daily   And   hydrocortisone  10 mg Oral Daily   insulin aspart  0-15 Units Subcutaneous TID WC   insulin aspart  0-5 Units Subcutaneous QHS   insulin aspart  8 Units Subcutaneous TID WC   insulin detemir  26 Units Subcutaneous BID   lidocaine  1 patch Transdermal Daily   melatonin  5 mg Oral q1800   midodrine  20 mg Oral TID with meals   nystatin   Topical BID   pantoprazole  40 mg Oral Daily   sodium chloride flush  10-40 mL Intracatheter Q12H   tamsulosin  0.4 mg Oral Daily   traZODone  50 mg Oral q1800   zolpidem  5 mg Oral q1800   Continuous Infusions:  sodium chloride 250 mL (09/14/22 0248)   sodium chloride 10 mL/hr at 09/11/22 0431   sodium chloride     ertapenem Stopped (09/15/22 1623)   norepinephrine (LEVOPHED) Adult infusion 2 mcg/min (09/16/22 0900)   PRN Meds: sodium chloride, Place/Maintain arterial line **AND** sodium chloride, diclofenac Sodium, mouth rinse, oxyCODONE, polyvinyl alcohol, sodium chloride flush   Vital Signs    Vitals:   09/16/22 0815 09/16/22 0830 09/16/22 0845 09/16/22 0900  BP:  110/61  105/64  Pulse: 63 68 68 68  Resp: 10 13 (!) 9 13  Temp:  97.7 F (36.5 C)     TempSrc:  Oral    SpO2: 100% 100% 100% 100%  Weight:      Height:        Intake/Output Summary (Last 24 hours) at 09/16/2022 0936 Last data filed at 09/16/2022 0900 Gross per 24 hour  Intake 2766.44 ml  Output 2285 ml  Net 481.44 ml      09/14/2022    5:00 AM 09/13/2022    5:00 AM 09/12/2022    5:00 AM  Last 3 Weights  Weight (lbs) 234 lb 2.1 oz 227 lb 8.2 oz 225 lb 1.4 oz  Weight (kg) 106.2 kg 103.2 kg 102.1 kg      Telemetry    Normal sinus rhythm with PVCs. Rates in the high 50s to 70s. - Personally Reviewed  ECG    No new ECG tracing since 09/02/2022. - Personally Reviewed  Physical Exam   GEN: No acute distress.   Neck: No JVD. Cardiac: RRR. III/VI systolic murmur. No rubs or gallops. Radial pulses 2+ and equal bilaterally Respiratory: No increased work of breathing. Diminished breath sounds in bilateral bases. No wheezes, rhonchi, or rales. GI: Soft, non-distended, and non-tender. MS: Trace right lower extremity. S/p left below knee amputation.  Skin: Warm and dry. Neuro:  No focal deficits. Psych: Normal affect. Responds appropriately.  Labs    High Sensitivity Troponin:   Recent Labs  Lab 09/01/22 0040 09/02/22 1935  TROPONINIHS 263* 771*     Chemistry Recent Labs  Lab 09/10/22 0521 09/11/22 0445 09/11/22 1200 09/12/22 0359 09/13/22 0340 09/14/22 0531 09/15/22 0621 09/16/22 0408  NA 130*   < >  --  130* 130* 130* 132* 131*  K 4.2   < >  --  4.5 3.9 4.3 4.3 4.2  CL 99   < >  --  99 104 103 104 106  CO2 24   < >  --  22 20* 20* 22 20*  GLUCOSE 243*   < >  --  262* 288* 202* 210* 166*  BUN 28*   < >  --  43* 46* 47* 45* 45*  CREATININE 0.89   < >  --  1.26* 1.14 1.10 1.02 1.16  CALCIUM 8.1*   < >  --  8.6* 7.7* 8.3* 8.3* 7.9*  MG  --   --   --  2.0 1.8 1.8  --   --   PROT  --   --  5.5*  --   --   --   --   --   ALBUMIN 2.5*  --  2.6*  --   --   --   --   --   AST  --   --  17  --   --   --   --   --   ALT  --   --  23  --   --   --    --   --   ALKPHOS  --   --  78  --   --   --   --   --   BILITOT  --   --  0.7  --   --   --   --   --   GFRNONAA >60   < >  --  >60 >60 >60 >60 >60  ANIONGAP 7   < >  --  '9 6 7 6 5   '$ < > = values in this interval not displayed.    Lipids No results for input(s): "CHOL", "TRIG", "HDL", "LABVLDL", "LDLCALC", "CHOLHDL" in the last 168 hours.  Hematology Recent Labs  Lab 09/12/22 0359 09/14/22 0531 09/16/22 0408  WBC 8.7 8.1 4.7  RBC 3.06* 3.09* 3.00*  HGB 7.6* 7.7* 7.3*  HCT 25.2* 25.5* 24.7*  MCV 82.4 82.5 82.3  MCH 24.8* 24.9* 24.3*  MCHC 30.2 30.2 29.6*  RDW 16.5* 16.6* 16.8*  PLT 249 226 182   Thyroid  Recent Labs  Lab 09/15/22 0621 09/15/22 0928  TSH 3.797  --   FREET4  --  0.74    BNPNo results for input(s): "BNP", "PROBNP" in the last 168 hours.  DDimer No results for input(s): "DDIMER" in the last 168 hours.   Radiology    No results found.  Cardiac Studies   Left Cardiac Catheterization 06/17/2022: Conclusions: Severe multivessel coronary artery disease with severe calcification, as detailed below, including 50% mid LAD stenosis, 95% stenosis of dominant branch of large D1 and chronic total occlusion of lateral branch of D1, chronic total occlusion of LCx, and chronic total occlusion of ostial through mid RCA (including stent placed in 01/2022) with left-to-right collaterals. Normal left ventricular filling pressure (LVEDP 10 mmHg).   Recommendations: Consider viability study with  particular attention to the inferior wall.  If RCA territory is viable, cardiac surgery consultation for CABG should be considered.  If RCA territory is not viable (or the patient is not a candidate for CABG), medical therapy versus PCI to dominant branch of D1 (particularly if the patient reports recurrent angina) should be considered. Hold ticagrelor pending further workup and possible cardiac surgery consultation.  If the patient does not undergo CABG, I would favor 12 months of  clopidogrel + DOAC therapy in the setting of new atrial fibrillation and NSTEMI. Aggressive secondary prevention of coronary artery disease.  Diagnostic Dominance: Right   _______________  Echocardiogram 09/03/2022: Impressions: 1. Left ventricular ejection fraction, by estimation, is 30 to 35%. The  left ventricle has moderately decreased function. The left ventricle  demonstrates regional wall motion abnormalities (see scoring  diagram/findings for description). The left  ventricular internal cavity size was mildly dilated. Left ventricular  diastolic parameters are consistent with Grade II diastolic dysfunction  (pseudonormalization).   2. Right ventricular systolic function is moderately reduced. The right  ventricular size is mildly enlarged. There is mildly elevated pulmonary  artery systolic pressure. The estimated right ventricular systolic  pressure is 37.9 mmHg.   3. Left atrial size was moderately dilated.   4. Right atrial size was mildly dilated.   5. The mitral valve is abnormal. Moderate mitral valve regurgitation. No  evidence of mitral stenosis.   6. The aortic valve is tricuspid. There is mild calcification of the  aortic valve. Aortic valve regurgitation is not visualized. Aortic valve  sclerosis is present, with no evidence of aortic valve stenosis.   7. Aortic dilatation noted. There is mild dilatation of the aortic root,  measuring 42 mm.   8. The inferior vena cava is normal in size with <50% respiratory  variability, suggesting right atrial pressure of 8 mmHg.   Comparison(s): Changes from prior study are noted. EF significantly  reduced compared to prior, now with more prominent anterior wall motion  abnormalities. Findings communicated to PCCM team.   Conclusion(s)/Recommendation(s): No left ventricular mural or apical  thrombus/thrombi.    Patient Profile     66 y.o. male with a history of CAD prior PTCA and PCI of RCA in 01/2022 and recent NSTEMI  in 05/2022 where he was noted to have severe multivessel disease with CTO of RCA, LCX, and branch of D1 (not felt to be a candidate for CABG or PCI and treated medically). 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 He has had multiple admission over the last 3 months for multiple medical issues including acute metabolic encephalopathy/ altered mental status, hypoglycemia, hematuria, complicated UTI, and urinary retention. He was readmitted on 08/31/2022 for hematuria and AKI (felt to be pre-renal/ATN vs postrenal/obstructive in setting of ongoing hematuria with clots requiring Foley placement and irrigation. He was also felt to have septic shock secondary to UTI requiring multiple pressors. Hospital course has been complicated by pressure ulcer of sacrum, cellulitis of scrotum, hyperkalemia requiring Lokelma, metabolic acidosis, and hyponatremia. He was initially started on IV fluids but then felt to be volume overloaded and was treated with IV Lasix. Cardiology consulted for further evaluation of acute on chronic CHF.  Assessment & Plan    Acute on Chronic HFrEF Ischemic Cardiomyopathy Bilateral Pleural  Effusions Occurred after treatment of sepsis with IV fluids. BNP 3,094 >> 2,623. Chest CTA on 11/13 negative for PE but did show borderline cardiomegaly with biatrial chamber prominence, slight distention of the superior pulmonary veins, and slight interstitial edema in the base of the lungs as well as small to moderate layering pleural effusion with compressive atelectasis. Echo showed LVEF of 30-35% with akinesis of the mid and distal anterior wall and apical alteral segment and hypokinesis of the antero-lateral wall, posterior  wall, basal anteroseptal segment, basal anterior segment, basal inferior segment, basal inferoseptal segment, and apex as well as grade 2 diastolic dysfunction. Also showed mildly enlarged RV with moderately reduced systolic function, moderate MR, and mild dilation of the aortic root measuring 42 mm. Initially started on IV Lasix but this was stopped on 09/06/2022 and has been auto-diuresing. Net negative 18 L. Renal function stable. - Continues to have diminished breaths sounds in bilateral bases consistent with pleural effusion. Wonder if he would benefit from thoracentesis. Will discuss with MD. - Would hold off on additional Lasix at this time as he seems to be autodiuresing well. - Unable to initiate GDMT due to hypotension still requiring Midodrine. Levophed was able to be stopped on 11/26.  - Patient is not felt to be candidate for home inotropes or advanced heart failure therapies. NO plans to pursue invasive cardiac work-up.  Type 2 NSTEMI Multiple Vessel CAD History of prior PCI to RCA in 01/2022 at an outside facility. Admitted to [facility] in 05/2022 for NSTEMI and found to have severe multivessel CAD with CTO of ostial to mid RCA, CTO of LCX, and CTO of lateral branch of D1. Not felt to be candidate for CABG or PCI and treated medically. High-sensitivity troponin 263 >> 771 this admission likely due to demand ischemic from septic shock. - No ischemic symptoms. - Continue Plavix. No aspirin due to need for Eliquis. - No beta-blocker due to hypotension. - Continue high-intensity statin.  Paroxysmal Atrial Fibrillation Maintaining sinus rhythm  with occasional PVCs. - No beta-blockers due to hypotension. - Continue Eliquis '5mg'$  twice daily.  Otherwise, per primary team: - Shock - Prostatitis  - Scrotal cellulitis - Hematuria due to UTI - Prostate cancer - Hypovolemic hyponatremia - AKI: improved - Hypocalcemia - Type 2 diabetes mellitus - Sacral ulcer - Phantom limb pain -  Deconditioning   For questions or updates, please contact [place] Please consult [email or web address] for contact info under        Signed, Darreld Mclean, PA-C  09/16/2022, 9:36 AM    Patient seen and examined with CG PA-C.  Agree as above, with the following exceptions and changes as noted below. Patient dejected over social situation, no acute medical concerns. Gen: depressed mood but no physical distress, CV: RRR, no murmurs, Lungs: diminished in bases, Abd: soft, Extrem: L BKA, right LE with trace edema Neuro/Psych: alert and oriented x 3. All available labs, radiology testing, previous records reviewed. Likely demand ischemia in setting of mixed picture of shock. Will titrate GDMT when able but limited by BP requiring midodrine.  Elouise Munroe, MD 09/16/22 1:57 PM

## 2022-09-16 NOTE — Progress Notes (Signed)
Physical Therapy Treatment Patient Details Name: Peter Yang MRN: [id number] DOB: February 17, 1956 Today's Date: 09/16/2022   History of Present Illness Pt is a 66 year old man admitted from SNF with gross hematuria, UTI and sepsis on 11/11. PMH: CAD, NSTEMI, AAA, TIA, PAF, DM, diabetic neuropathy, L BKA, HTN, HLD, CHF with EF of 40-45%, recent admissions with sepsis, UTI, hypoglycemia and recent COVID.    PT Comments    General Comments: AxO x 3 currently "frustrated" trying to pay bills over phone.  "I have alot going on right now".  Pt declined any OOB/EOB activity but did agree to some LE TE's. Pt in bed with [employer] shrinker on L LE.  Performed 10 reps L LE SLR, SAQ's, ADd and knee presses.  Performed static L knee extension stretches x 3 resp with 5 sec hold as pt presents with tight knee flexors.  Also perform R knee static stretching.  "My legs will straighten out once I put my leg on".   Pt did agree for [place] to return tomorrow approx 11 am, hoping for "a better day". Pt lives alone and will need ST Rehab at SNF to address his decline in functional mobility.    Recommendations for follow up therapy are one component of a multi-disciplinary discharge planning process, led by the attending physician.  Recommendations may be updated based on patient status, additional functional criteria and insurance authorization.  Follow Up Recommendations  Skilled nursing-short term rehab (<3 hours/day) Can patient physically be transported by private vehicle: No   Assistance Recommended at Discharge Frequent or constant Supervision/Assistance  Patient can return home with the following Two people to help with walking and/or transfers;Help with stairs or ramp for entrance;A little help with bathing/dressing/bathroom;Assistance with cooking/housework;Assist for transportation   Equipment Recommendations  None recommended by PT    Recommendations for Other Services       Precautions / Restrictions  Precautions Precautions: Fall Precaution Comments: L BKA with prosthesis in room, wear shrinker and monitor BP        Balance                                            Cognition Arousal/Alertness: Awake/alert Behavior During Therapy: Flat affect                                   General Comments: AxO x 3 currently "frustrated" trying to pay bills over phone.  "I have alot going on right now".  Pt declined any OOB/EOB activity but did agree to some LE TE's.        Exercises      General Comments        Pertinent Vitals/Pain Pain Assessment Pain Assessment: Faces Faces Pain Scale: Hurts little more Pain Location: scrotum from foley "pulling" Pain Descriptors / Indicators: Discomfort, Guarding Pain Intervention(s): Monitored during session    Home Living                          Prior Function            PT Goals (current goals can now be found in the care plan section) Progress towards PT goals: Progressing toward goals    Frequency           PT  Plan Current plan remains appropriate    Co-evaluation              AM-PAC PT "6 Clicks" Mobility   Outcome Measure  Help needed turning from your back to your side while in a flat bed without using bedrails?: A Lot Help needed moving from lying on your back to sitting on the side of a flat bed without using bedrails?: A Lot Help needed moving to and from a bed to a chair (including a wheelchair)?: A Lot Help needed standing up from a chair using your arms (e.g., wheelchair or bedside chair)?: A Lot Help needed to walk in hospital room?: A Lot Help needed climbing 3-5 steps with a railing? : Total 6 Click Score: 11    End of Session   Activity Tolerance: Patient tolerated treatment well Patient left: in bed Nurse Communication: Mobility status PT Visit Diagnosis: Other abnormalities of gait and mobility (R26.89);Muscle weakness (generalized) (M62.81)      Time: 2094-7096 PT Time Calculation (min) (ACUTE ONLY): 13 min  Charges:  $Therapeutic Exercise: 8-22 mins                     Rica Koyanagi  PTA Acute  Rehabilitation Services Office M-F          [phone] Weekend pager [phone]

## 2022-09-16 NOTE — TOC Initial Note (Signed)
Transition of Care [facility]) - Initial/Assessment Note    Patient Details  Name: Peter Yang MRN: [id number] Date of Birth: 1956/08/28  [facility] CM/SW Contact:    Leeroy Cha, RN Phone Number: 09/16/2022, 7:46 AM  Clinical Narrative:                 First day with patient.  Assessment done.  Possible home with hospice care and [facility]. Will follow for progression.  MD note does state that the next of kin the sister is not to be contacted.  If patient expires there are two contacts to notify.  Monico Hoar at [phone] and Luan Pulling at [phone].  Expected Discharge Plan: [place] Barriers to Discharge: Continued Medical Work up   Patient Goals and CMS Choice Patient states their goals for this hospitalization and ongoing recovery are:: tio go home CMS Medicare.gov Compare Post Acute Care list provided to:: Patient Choice offered to / list presented to : Patient  Expected Discharge Plan and Services Expected Discharge Plan: [place]   Discharge Planning Services: CM Consult   Living arrangements for the past 2 months: Single Family Home                                      Prior Living Arrangements/Services Living arrangements for the past 2 months: Single Family Home Lives with:: Self Patient language and need for interpreter reviewed:: Yes Do you feel safe going back to the place where you live?: Yes            Criminal Activity/Legal Involvement Pertinent to Current Situation/Hospitalization: No - Comment as needed  Activities of Daily Living Home Assistive Devices/Equipment: Eyeglasses, Prosthesis ADL Screening (condition at time of admission) Patient's cognitive ability adequate to safely complete daily activities?: Yes Is the patient deaf or have difficulty hearing?: No Does the patient have difficulty seeing, even when wearing glasses/contacts?: Yes Does the patient have difficulty  concentrating, remembering, or making decisions?: No Patient able to express need for assistance with ADLs?: Yes Does the patient have difficulty dressing or bathing?: No Independently performs ADLs?: Yes (appropriate for developmental age) Does the patient have difficulty walking or climbing stairs?: Yes Weakness of Legs: Left Weakness of Arms/Hands: None  Permission Sought/Granted                  Emotional Assessment Appearance:: Appears stated age Attitude/Demeanor/Rapport: Guarded Affect (typically observed): Adaptable Orientation: : Oriented to Self, Oriented to Place, Oriented to  Time, Oriented to Situation Alcohol / Substance Use: Never Used Psych Involvement: No (comment)  Admission diagnosis:  Gross hematuria [R31.0] AKI (acute kidney injury) ([place]) [N17.9] Hydronephrosis, unspecified hydronephrosis type [N13.30] Patient Active Problem List   Diagnosis Date Noted   Counseling and coordination of care 09/15/2022   Acute on chronic systolic congestive heart failure ([place]) 09/15/2022   Pain 09/14/2022   High risk medication use 09/14/2022   Need for emotional support 09/12/2022   Mood altered 09/11/2022   Medication management 09/11/2022   AKI (acute kidney injury) ([place]) 09/10/2022   Palliative care by specialist 09/08/2022   Goals of care, counseling/discussion 09/08/2022   Cardiogenic shock ([place]) 09/05/2022   Physical deconditioning 09/05/2022   Hyponatremia 09/03/2022   Acute prostatitis 09/02/2022   Septic shock ([place]) 09/02/2022   Gross hematuria 08/31/2022   Lactic acidosis 08/31/2022   Cellulitis of scrotum  08/31/2022   Type 2 diabetes mellitus with diabetic peripheral angiopathy without gangrene, without long-term current use of insulin (HCC)    Acute kidney injury ([place])    Impaired functional mobility, balance, gait, and endurance 08/03/2022   Obstructive sleep apnea syndrome 08/03/2022   Pleural effusion, bilateral 08/03/2022   Catheter-associated  urinary tract infection ([place]) 08/03/2022   Lab test positive for detection of COVID-19 virus 08/03/2022   History of urinary retention 08/03/2022   Mitral valve regurgitation 08/03/2022   UTI (urinary tract infection) 08/02/2022   Thoracic ascending aortic aneurysm ([place]) 07/23/2022   Borderline low blood pressure determined by examination 07/04/2022   Hematuria 06/25/2022   History of non-ST elevation myocardial infarction (NSTEMI) 06/15/2022   Paroxysmal atrial fibrillation ([place]) 06/15/2022   Dyslipidemia 06/15/2022   PVD (peripheral vascular disease) ([place]) 06/15/2022   Pressure ulcer of sacral region, stage 2 ([place]) 06/15/2022   Acute on chronic HFrEF (heart failure with reduced ejection fraction) ([place]) 06/15/2022   DNR (do not resuscitate) 06/15/2022   Ischemic cardiomyopathy 02/12/2022   Need for assistance with personal care 08/03/2021   Diabetic polyneuropathy associated with type 2 diabetes mellitus ([place]) 07/28/2021   Prolonged QT interval 07/28/2021   Moderate protein-calorie malnutrition ([place]) 06/29/2021   Chronic insomnia 03/31/2021   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 [place] [address] Phone: [phone] Fax: [phone]  [id number] [place] - [address] [place] [address]Phone: [phone]: [phone]    Social Determinants of Health (SDOH) Interventions    Readmission Risk Interventions   Row Labels 09/02/2022   10:36 AM  Readmission Risk Prevention Plan    Section Header. No data exists in this row.   Transportation Screening   Complete  Medication Review (Press photographer   Complete  PCP or Specialist appointment within 3-5 days of discharge   Complete  HRI or Home Care Consult   Complete  SW Recovery Care/Counseling Consult   Complete  [place]  Complete

## 2022-09-17 DIAGNOSIS — R310 Gross hematuria: Secondary | ICD-10-CM | POA: Diagnosis not present

## 2022-09-17 DIAGNOSIS — N3001 Acute cystitis with hematuria: Secondary | ICD-10-CM | POA: Diagnosis not present

## 2022-09-17 DIAGNOSIS — Z515 Encounter for palliative care: Secondary | ICD-10-CM | POA: Diagnosis not present

## 2022-09-17 DIAGNOSIS — N179 Acute kidney failure, unspecified: Secondary | ICD-10-CM | POA: Diagnosis not present

## 2022-09-17 DIAGNOSIS — Z7189 Other specified counseling: Secondary | ICD-10-CM | POA: Diagnosis not present

## 2022-09-17 DIAGNOSIS — N410 Acute prostatitis: Secondary | ICD-10-CM | POA: Diagnosis not present

## 2022-09-17 LAB — GLUCOSE, CAPILLARY
Glucose-Capillary: 75 mg/dL (ref 70–99)
Glucose-Capillary: 62 mg/dL — ABNORMAL LOW (ref 70–99)
Glucose-Capillary: 63 mg/dL — ABNORMAL LOW (ref 70–99)
Glucose-Capillary: 94 mg/dL (ref 70–99)
Glucose-Capillary: 112 mg/dL — ABNORMAL HIGH (ref 70–99)
Glucose-Capillary: 109 mg/dL — ABNORMAL HIGH (ref 70–99)

## 2022-09-17 MED ORDER — INSULIN ASPART 100 UNIT/ML IJ SOLN
0.0000 [IU] | Freq: Three times a day (TID) | INTRAMUSCULAR | Status: DC
  Administered 2022-09-18: 1 [IU] via SUBCUTANEOUS
  Administered 2022-09-19 (×2): 2 [IU] via SUBCUTANEOUS
  Administered 2022-09-20: 3 [IU] via SUBCUTANEOUS
  Administered 2022-09-20: 2 [IU] via SUBCUTANEOUS
  Administered 2022-09-21: 3 [IU] via SUBCUTANEOUS
  Administered 2022-09-22: 2 [IU] via SUBCUTANEOUS
  Administered 2022-09-22: 1 [IU] via SUBCUTANEOUS
  Administered 2022-09-22 – 2022-09-23 (×2): 2 [IU] via SUBCUTANEOUS
  Administered 2022-09-23: 1 [IU] via SUBCUTANEOUS
  Administered 2022-09-23: 2 [IU] via SUBCUTANEOUS
  Administered 2022-09-24: 1 [IU] via SUBCUTANEOUS
  Administered 2022-09-24: 2 [IU] via SUBCUTANEOUS
  Administered 2022-09-24 – 2022-09-25 (×3): 1 [IU] via SUBCUTANEOUS
  Administered 2022-09-25: 2 [IU] via SUBCUTANEOUS
  Administered 2022-09-26: 3 [IU] via SUBCUTANEOUS
  Administered 2022-09-27: 2 [IU] via SUBCUTANEOUS
  Administered 2022-09-27 – 2022-09-28 (×4): 1 [IU] via SUBCUTANEOUS
  Administered 2022-09-28: 2 [IU] via SUBCUTANEOUS
  Administered 2022-09-29 (×2): 1 [IU] via SUBCUTANEOUS
  Administered 2022-09-29 – 2022-10-01 (×3): 2 [IU] via SUBCUTANEOUS
  Administered 2022-10-01 – 2022-10-02 (×2): 1 [IU] via SUBCUTANEOUS
  Administered 2022-10-02: 2 [IU] via SUBCUTANEOUS
  Administered 2022-10-02: 1 [IU] via SUBCUTANEOUS
  Administered 2022-10-03: 2 [IU] via SUBCUTANEOUS
  Administered 2022-10-03 – 2022-10-04 (×3): 1 [IU] via SUBCUTANEOUS
  Administered 2022-10-04: 2 [IU] via SUBCUTANEOUS
  Administered 2022-10-04 – 2022-10-05 (×3): 1 [IU] via SUBCUTANEOUS
  Administered 2022-10-05: 3 [IU] via SUBCUTANEOUS
  Administered 2022-10-06 (×2): 1 [IU] via SUBCUTANEOUS
  Administered 2022-10-06 – 2022-10-07 (×3): 2 [IU] via SUBCUTANEOUS
  Administered 2022-10-07: 3 [IU] via SUBCUTANEOUS
  Administered 2022-10-08 (×2): 2 [IU] via SUBCUTANEOUS
  Administered 2022-10-08: 3 [IU] via SUBCUTANEOUS
  Administered 2022-10-09 – 2022-10-11 (×7): 2 [IU] via SUBCUTANEOUS
  Administered 2022-10-11: 3 [IU] via SUBCUTANEOUS
  Administered 2022-10-11 – 2022-10-13 (×5): 2 [IU] via SUBCUTANEOUS
  Administered 2022-10-13: 3 [IU] via SUBCUTANEOUS
  Administered 2022-10-13 – 2022-10-14 (×2): 2 [IU] via SUBCUTANEOUS
  Administered 2022-10-14: 3 [IU] via SUBCUTANEOUS
  Administered 2022-10-14 – 2022-10-15 (×3): 2 [IU] via SUBCUTANEOUS
  Administered 2022-10-15: 7 [IU] via SUBCUTANEOUS
  Administered 2022-10-16: 3 [IU] via SUBCUTANEOUS
  Administered 2022-10-16: 2 [IU] via SUBCUTANEOUS
  Administered 2022-10-16 – 2022-10-17 (×2): 3 [IU] via SUBCUTANEOUS
  Administered 2022-10-17: 2 [IU] via SUBCUTANEOUS
  Administered 2022-10-17: 3 [IU] via SUBCUTANEOUS
  Administered 2022-10-18: 2 [IU] via SUBCUTANEOUS
  Administered 2022-10-18: 3 [IU] via SUBCUTANEOUS
  Administered 2022-10-18 – 2022-10-19 (×2): 2 [IU] via SUBCUTANEOUS
  Administered 2022-10-19: 3 [IU] via SUBCUTANEOUS
  Administered 2022-10-19: 2 [IU] via SUBCUTANEOUS
  Administered 2022-10-20 – 2022-10-21 (×4): 3 [IU] via SUBCUTANEOUS
  Administered 2022-10-21 – 2022-10-22 (×2): 2 [IU] via SUBCUTANEOUS
  Administered 2022-10-22: 3 [IU] via SUBCUTANEOUS
  Administered 2022-10-22: 5 [IU] via SUBCUTANEOUS
  Administered 2022-10-23 (×3): 2 [IU] via SUBCUTANEOUS
  Administered 2022-10-24: 1 [IU] via SUBCUTANEOUS
  Administered 2022-10-24: 2 [IU] via SUBCUTANEOUS
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
## 2022-09-17 NOTE — Progress Notes (Signed)
Daily Progress Note   Patient Name: Peter Yang       Date: 09/17/2022 DOB: 01-15-1956  Age: 66 y.o. MRN#: [id number] Attending Physician: Dwyane Dee, MD Primary Care Physician: Clovia Cuff, MD Admit Date: 08/31/2022 Length of Stay: 16 days  Reason for Consultation/Follow-up: Establishing goals of care and symptom management  Subjective:   CC: Patient is resting in bed  following up regarding complex medical decision making.   Subjective:  Awake alert oriented, resting in bed, denies any acute concerns, received oxycodone 5 mg x 2 doses, also on gabapentin, now off pressors and out of the ICU, states that he is trying to participate with PT but that Foley catheter gets in the way.     Review of Systems Denies any symptoms of concern at this time.  Objective:   Vital Signs:  BP 103/60 (BP Location: Left Arm)   Pulse 73   Temp (!) 97.3 F (36.3 C) (Oral)   Resp 19   Ht '6\' 4"'$  (1.93 m)   Wt 110.4 kg   SpO2 100%   BMI 29.63 kg/m   Physical Exam: General: Awake, alert, interactive, chronically ill appearing  Eyes: No drainage noted HENT: moist mucous membranes Cardiovascular: RRR Respiratory: no increased work of breathing noted, not in respiratory distress Extremities: L BKA Skin: no rashes or lesions on visible skin Psych: Tearful with discussing difficult topics  Imaging: I personally reviewed recent imaging.   Assessment & Plan:   Assessment: 66 year old man, from [place], admitted to the hospital 08/31/2022 because of hematuria. He has a past medical history of CAD/NSTEMI, AAA, TIA, PAF on Eliquis, diabetes, hypertension, heart failure with reduced ejection fraction left ventricular ejection fraction 40-45%, history of recent COVID. Patient admitted to hospital medicine service because of a complicated UTI and hematuria, cultures positive for ESBL, antibiotics adjusted, urology also consulted for scrotal erythema and cellulitis,  remains on antibiotics, hospital course complicated by ongoing hypotension requiring peripheral Levophed and Dobutamine support. Patient also on intravenous heparin infusion. PMT consulted for goals of care discussions.   Recommendations/Plan: # Complex medical decision making/goals of care:  -   -Patient completed ACP documentation with chaplain on 09/13/2022.  -Patient provided the name of 2 friends that can be contacted only if he dies.  Names are: Monico Hoar number [phone] in [place] and Luan Pulling number [phone] in [place].  Encourage patient that when he completes ACP documentation, he should think about who he would want contacted if he is unable to answer medical questions though has not reached the state of death yet such as being in a coma.  Patient to consider this.  -  Code Status: DNR  # Symptom management:  Pain   Patient is on gabapentin, oxycodone as needed, scheduled Tylenol and lidocaine patch.-  Insomnia   -Continue trazodone, zolpidem, melatonin, and gabapentin with earlier dosing.   # Psychosocial Support:  -No next of kin, no family, no HCPOA, does not want friends contacted.     -Patient willing to provide the name of 2 friends that can be contacted only if he dies.  Names are: Monico Hoar number [phone] in [place] and Luan Pulling number [phone] in [place].    # Discharge Planning: Making attempts to participate with PT, monitor for disposition options.  Discussed with:  patient  Thank you for allowing the palliative care team to participate in the care [name].  [place] MD Palliative Care Provider PMT # [phone]

## 2022-09-17 NOTE — Progress Notes (Signed)
Progress Note    Peter Yang   HGD:[id number]  DOB: Mar 10, 1956  DOA: 08/31/2022     16 PCP: Clovia Cuff, MD  Initial CC: hematuria  Hospital Course: 66 year old man who presented to the [facility] ED 11/11 with a chief complaint of hematuria. PMHx significant for HTN, HLD, HFrEF/ICM EF 40-45%, CAD/NSTEMI, TIA, PAF (on Eliquis), T2DM, BPH (hx prostate CA).   Patient with questionable length of time of hematuria which may have been going on since 10/13 per patient.  Sent to the emergency department by SNF staff on 11/11.   At the emergency department he developed hypotension with a BP 83/63. WBC 14.2, LA 5.0 > 7.9. Code Sepsis was called. Blood cultures and urine cultures were obtained. Patient was given fluid bolus per sepsis guidelines.  Patient with complicated infectious history-started on meropenem and amoxicillin by [facility].  Urology was consulted for hematuria and concern for Fournier's.  Recommends monitoring with possible cystoscopy once Eliquis washout. [facility] admitted patient.  11/11 Admit by [facility], BC>, UC>, PCCM consult, Mero and amoxicillin> 11/12 Started on peripheral Levophed for persistent hypotension, albumin 11/13 Troponin rising from 263 to 771, DVT/PE ruled out 11/14 dobutamine started for low coox, lower EF on echo 11/15 trial of restarting heparin 11/17 off dobutamine 11/19 coox 93, remains on pressors 11/20 Coox 67, CVP 8, on 3 norepi 11/21 Coox 75.1, 4 norepi  11/23 Norepi weaned to 67mg, hypotension into 80's overnight > increased levo. Robust UOP despite no lasix in 72 hours.   11/24 Levophed weaned off briefly yesterday and restarted overnight 11/26 Patient responsive to IVF resuscitation. Initial urine studies suggest hypovolemic hyponatremia 11/27 Ongoing mild hypotension, goal MAP 60. Remains on NE 210m. Midodrine increased, droxidopa added. Mild persistent hematuria with clots.  Interval History:  No events overnight.  Resting in bed comfortably with no concerns  this morning.  Assessment and Plan:  Shock, mixed septic from UTI >cardiogenic shock with new depressed EF>hypovolemic shock d/t overdiuresis Hypovolemic hyponatremia Clinically resolved infection and on appropriate antibiotics. Echo with EF 30% with concerns for cardiogenic shock however patient net negative volume. -Cardiology following intermittently while on any diuretics however still on droxidopa at this time - Continue hydrocortisone; will need to be tapered  Acute on chronic HFrEF, ICM  CAD with stent in 05/2022 > 09/03/22 TTE> EF now 30-35, G2DD  pAF Euvolemic, possible mixed cardiogenic shock picture - Cardiology consulted; not a candidate for home inotropes, revascularization, transplant or LVAD - Unable to tolerate GDMT due to persistent hypotension - Continue Plavix - Continue Eliquis -Cardiology following intermittently in case of being able to start any GDMT   Prostatitis Scrotal Cellulitis H/O ESBL E.Coli and CRE Proteus - ID consulted, appreciate recs -Last seen by ID on 09/05/2022.  Plan is for ertapenem 1 g daily for 4 weeks, end date 09/27/2022 - PICC in place  - Outpatient ID f/u, established in [place]   Hematuria due to UTI Known prostate cancer, under surveillance with urology - Trend H&H - Keep Foley until cleared by Urology post-discharge - Continue Eliquis/Plavix, monitor closely in the setting of hematuria - Continue Flomax, Proscar   Hyponatremia, hypovolemic Mild AKI - improving Hypocalcemia  - Trend BMP - Replete electrolytes as indicated - Monitor I&Os - Avoid nephrotoxic agents as able - Ensure adequate renal perfusion   DM2 -Continue Levemir - Continue SSI CBG monitoring -Also on prandial coverage   Deconditioning - PT/OT while in-house; recommended for SNF - Utilize prosthesis as able - TOC consulted;  patient does NOT want to return to prior SNF [facility])     Chronic Stage 2 sacral ulcer, POA - WOC   Insomnia, phantom limb  pain LLE - Continue reduced-dose gabapentin - Continue melatonin, trazodone, Ambien QHS PRN - Ortho tech consult for LLE sleeve   Chronic Critical Illness  - PMT consulted, appreciate recommendations - Patient acknowledges medical measures have not changed QOL - Continues to desire current scope of care - DNR code status    Old records reviewed in assessment of this patient  Antimicrobials: Ertapenem  DVT prophylaxis:  SCDs Start: 08/31/22 1944 apixaban (ELIQUIS) tablet 5 mg   Code Status:   Code Status: DNR  Mobility Assessment (last 72 hours)     Mobility Assessment     Row Name 09/17/22 0859 09/16/22 2200 09/16/22 2000 09/16/22 1523     Does patient have an order for bedrest or is patient medically unstable No - Continue assessment No - Continue assessment No - Continue assessment --    What is the highest level of mobility based on the progressive mobility assessment? Level 2 (Chairfast) - Balance while sitting on edge of bed and cannot stand Level 2 (Chairfast) - Balance while sitting on edge of bed and cannot stand Level 2 (Chairfast) - Balance while sitting on edge of bed and cannot stand Level 2 (Chairfast) - Balance while sitting on edge of bed and cannot stand    Is the above level different from baseline mobility prior to current illness? Yes - Recommend PT order Yes - Recommend PT order -- --             Barriers to discharge:  Disposition Plan:  SNF Status is: Inpt  Objective: Blood pressure (!) 97/53, pulse (!) 55, temperature (!) 97.5 F (36.4 C), resp. rate 16, height '6\' 4"'$  (1.93 m), weight 110.4 kg, SpO2 100 %.  Examination:  Physical Exam Constitutional:      General: He is not in acute distress.    Appearance: He is not ill-appearing.  HENT:     Head: Normocephalic and atraumatic.     Mouth/Throat:     Mouth: Mucous membranes are moist.  Eyes:     Extraocular Movements: Extraocular movements intact.  Cardiovascular:     Rate and Rhythm:  Normal rate and regular rhythm.  Pulmonary:     Effort: Pulmonary effort is normal.     Breath sounds: Normal breath sounds.  Abdominal:     General: Bowel sounds are normal. There is no distension.     Tenderness: There is no abdominal tenderness.  Genitourinary:    Comments: Foley in place with clear yellow urine Musculoskeletal:        General: Normal range of motion.     Cervical back: Normal range of motion and neck supple.  Skin:    General: Skin is warm and dry.  Neurological:     General: No focal deficit present.     Mental Status: He is alert.  Psychiatric:        Mood and Affect: Mood normal.      Consultants:  Urology ID Cardiology  Procedures:    Data Reviewed: Results for orders placed or performed during the hospital encounter of 08/31/22 (from the past 24 hour(s))  Glucose, capillary     Status: Abnormal   Collection Time: 09/16/22 12:08 PM  Result Value Ref Range   Glucose-Capillary 162 (H) 70 - 99 mg/dL   Comment 1 Notify RN  Comment 2 Document in Chart   Glucose, capillary     Status: Abnormal   Collection Time: 09/16/22  5:28 PM  Result Value Ref Range   Glucose-Capillary 159 (H) 70 - 99 mg/dL   Comment 1 Notify RN    Comment 2 Document in Chart   Glucose, capillary     Status: Abnormal   Collection Time: 09/16/22  9:56 PM  Result Value Ref Range   Glucose-Capillary 120 (H) 70 - 99 mg/dL  Glucose, capillary     Status: None   Collection Time: 09/17/22  7:47 AM  Result Value Ref Range   Glucose-Capillary 75 70 - 99 mg/dL    I have Reviewed nursing notes, Vitals, and Lab results since pt's last encounter. Pertinent lab results : see above I have ordered test including BMP, CBC, Mg I have reviewed the last note from staff over past 24 hours I have discussed pt's care plan and test results with nursing staff, case manager  Time spent: Greater than 50% of the 55 minute visit was spent in counseling/coordination of care for the patient as laid  out in the A&P.    LOS: 16 days   Dwyane Dee, MD [facility] 09/17/2022, 11:27 AM

## 2022-09-17 NOTE — Progress Notes (Signed)
OT Cancellation Note  Patient Details Name: Peter Yang MRN: [id number] DOB: April 29, 1956   Cancelled Treatment:    Reason Eval/Treat Not Completed: Other (comment). Patient reported he has a lot going on right now in his life that needs to get better. He declined to participate in the session.   Leota Sauers, OTR/L 09/17/2022, 2:32 PM

## 2022-09-17 NOTE — Progress Notes (Signed)
CBG 62. Given 4 oz. Juice. CBG recheck 15 minutes later, 63. Given 4 oz juice. CBG rechecked 15 minutes late, 94.

## 2022-09-17 NOTE — Inpatient Diabetes Management (Signed)
Inpatient Diabetes Program Recommendations  AACE/ADA: New Consensus Statement on Inpatient Glycemic Control (2015)  Target Ranges:  Prepandial:   less than 140 mg/dL      Peak postprandial:   less than 180 mg/dL (1-2 hours)      Critically ill patients:  140 - 180 mg/dL   Lab Results  Component Value Date   GLUCAP 94 09/17/2022   HGBA1C 6.6 (H) 08/03/2022    Review of Glycemic Control  Latest Reference Range & Units 09/16/22 21:56 09/17/22 07:47 09/17/22 11:54 09/17/22 12:11 09/17/22 12:38  Glucose-Capillary 70 - 99 mg/dL 120 (H) 75 62 (L) 63 (L) 94   Diabetes history: DM Outpatient Diabetes medications:  Jardiance 10 mg daily, Metformin 1000 mg bid Current orders for Inpatient glycemic control:  Novolog 0-15 units tid with meals and HS Cortef 20 mg daily Novolog 8 units tid with meals Levemir 26 units bid  Inpatient Diabetes Program Recommendations:    Note steroids reduced. May consider d/c of Novolog meal coverage.  Also consider reducing Levemir to 12 units bid.    Thanks,  Adah Perl, RN, BC-ADM Inpatient Diabetes Coordinator Pager [phone]  (8a-5p)

## 2022-09-18 DIAGNOSIS — N179 Acute kidney failure, unspecified: Secondary | ICD-10-CM | POA: Diagnosis not present

## 2022-09-18 DIAGNOSIS — N1330 Unspecified hydronephrosis: Secondary | ICD-10-CM

## 2022-09-18 DIAGNOSIS — R310 Gross hematuria: Secondary | ICD-10-CM | POA: Diagnosis not present

## 2022-09-18 LAB — CBC
HCT: 25.8 % — ABNORMAL LOW (ref 39.0–52.0)
Hemoglobin: 7.7 g/dL — ABNORMAL LOW (ref 13.0–17.0)
MCH: 24.4 pg — ABNORMAL LOW (ref 26.0–34.0)
MCHC: 29.8 g/dL — ABNORMAL LOW (ref 30.0–36.0)
MCV: 81.9 fL (ref 80.0–100.0)
Platelets: 184 10*3/uL (ref 150–400)
RBC: 3.15 MIL/uL — ABNORMAL LOW (ref 4.22–5.81)
RDW: 17 % — ABNORMAL HIGH (ref 11.5–15.5)
WBC: 7.4 10*3/uL (ref 4.0–10.5)
nRBC: 0 % (ref 0.0–0.2)

## 2022-09-18 LAB — GLUCOSE, CAPILLARY
Glucose-Capillary: 73 mg/dL (ref 70–99)
Glucose-Capillary: 93 mg/dL (ref 70–99)
Glucose-Capillary: 142 mg/dL — ABNORMAL HIGH (ref 70–99)
Glucose-Capillary: 161 mg/dL — ABNORMAL HIGH (ref 70–99)
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
## 2022-09-18 NOTE — Progress Notes (Signed)
Progress Note   Patient: Peter Yang GEX:[id number] DOB: 1956-06-28 DOA: 08/31/2022     17 DOS: the patient was seen and examined on 09/18/2022   Brief hospital course: 66 year old man who presented to the [facility] ED 11/11 with a chief complaint of hematuria. PMHx significant for HTN, HLD, HFrEF/ICM EF 40-45%, CAD/NSTEMI, TIA, PAF (on Eliquis), T2DM, BPH (hx prostate CA).   Patient with questionable length of time of hematuria which may have been going on since 10/13 per patient.  Sent to the emergency department by SNF staff on 11/11.   At the emergency department he developed hypotension with a BP 83/63. WBC 14.2, LA 5.0 > 7.9. Code Sepsis was called. Blood cultures and urine cultures were obtained. Patient was given fluid bolus per sepsis guidelines.  Patient with complicated infectious history-started on meropenem and amoxicillin by TRH.  Urology was consulted for hematuria and concern for Fournier's.  Recommends monitoring with possible cystoscopy once Eliquis washout. TRH admitted patient.   11/11 Admit by TRH, BC>, UC>, PCCM consult, Mero and amoxicillin> 11/12 Started on peripheral Levophed for persistent hypotension, albumin 11/13 Troponin rising from 263 to 771, DVT/PE ruled out 11/14 dobutamine started for low coox, lower EF on echo 11/15 trial of restarting heparin 11/17 off dobutamine 11/19 coox 93, remains on pressors 11/20 Coox 67, CVP 8, on 3 norepi 11/21 Coox 75.1, 4 norepi  11/23 Norepi weaned to 6mg, hypotension into 80's overnight > increased levo. Robust UOP despite no lasix in 72 hours.   11/24 Levophed weaned off briefly yesterday and restarted overnight 11/26 Patient responsive to IVF resuscitation. Initial urine studies suggest hypovolemic hyponatremia 11/27 Ongoing mild hypotension, goal MAP 60. Remains on NE 273m. Midodrine increased, droxidopa added. Mild persistent hematuria with clots.    Assessment and Plan: Shock, mixed septic from UTI >cardiogenic shock  with new depressed EF>hypovolemic shock d/t overdiuresis Hypovolemic hyponatremia Clinically resolved infection and on appropriate antibiotics. Echo with EF 30% with concerns for cardiogenic shock however patient net negative volume. -Cardiology following intermittently while on any diuretics however still on droxidopa at this time - Continue hydrocortisone; decreased to '20mg'$  daily /'10mg'$  qhs as of 11/27 -Plan to ultimately taper midodrine, dose recently increased to '20mg'$  tid frrom '15mg'$  tid per PCCM on 11/27   Acute on chronic HFrEF, ICM  CAD with stent in 05/2022 > 09/03/22 TTE> EF now 30-35, G2DD  pAF -Concerns for mixed cardiogenic shock picture - Cardiology consulted; not a candidate for home inotropes, revascularization, transplant or LVAD - Unable to tolerate GDMT due to persistent hypotension - Continue Plavix - Continue Eliquis -Cardiology following intermittently in case of being able to start any GDMT while on midodrine   Prostatitis Scrotal Cellulitis H/O ESBL E.Coli and CRE Proteus - ID consulted, appreciate recs -Last seen by ID on 09/05/2022.  Plan is for ertapenem 1 g daily for 4 weeks, end date 09/27/2022 - PICC in place  - Outpatient ID f/u, established in [place]   Hematuria due to UTI Known prostate cancer, under surveillance with urology - Trend H&H - Keep Foley until cleared by Urology post-discharge - Continue Eliquis/Plavix, monitor closely in the setting of hematuria - Continue Flomax, Proscar   Hyponatremia, hypovolemic Mild AKI - improving Hypocalcemia  - Trend BMP - Replete electrolytes as indicated - Monitor I&Os - Avoid nephrotoxic agents as able - Ensure adequate renal perfusion   DM2 -Continue Levemir - Continue SSI CBG monitoring -Also on prandial coverage   Deconditioning - PT/OT while in-house; recommended  for SNF - Utilize prosthesis as able - TOC consulted; patient does NOT want to return to prior SNF [facility])     Chronic Stage  2 sacral ulcer, POA - WOC   Insomnia, phantom limb pain LLE - Continue reduced-dose gabapentin - Continue melatonin, trazodone, Ambien QHS PRN - Ortho tech consult for LLE sleeve   Chronic Critical Illness  - PMT consulted, appreciate recommendations - Patient acknowledges medical measures have not changed QOL - Continues to desire current scope of care - DNR code status      Subjective: Without complaints this AM  Physical Exam: Vitals:   09/17/22 2140 09/18/22 0500 09/18/22 0639 09/18/22 1426  BP: 92/61  100/61 (!) 91/58  Pulse: 61  63 76  Resp: '18  16 18  '$ Temp: 97.7 F (36.5 C)  97.6 F (36.4 C) 98.1 F (36.7 C)  TempSrc: Oral  Oral Oral  SpO2: 98%  99% 100%  Weight:  118.6 kg    Height:       General exam: Awake, laying in bed, in nad Respiratory system: Normal respiratory effort, no wheezing Cardiovascular system: regular rate, s1, s2 Gastrointestinal system: Soft, nondistended, positive BS Central nervous system: CN2-12 grossly intact, strength intact Extremities: Perfused, no clubbing Skin: Normal skin turgor, no notable skin lesions seen Psychiatry: Mood normal // no visual hallucinations   Data Reviewed:  Labs reviewed: WBC 7.4, Hgb 7.7, Plts 184   Family Communication: Pt in room, family not at bedside  Disposition: Status is: Inpatient Remains inpatient appropriate because: Severity of illness  Planned Discharge Destination: Skilled nursing facility    Author: Marylu Lund, MD 09/18/2022 3:45 PM  For on call review [email or web address].

## 2022-09-18 NOTE — Progress Notes (Signed)
OT Cancellation Note  Patient Details Name: Peter Yang MRN: [id number] DOB: 1956/01/20   Cancelled Treatment:    Reason Eval/Treat Not Completed: Medical issues which prohibited therapy Patient declined to participate in ADLs at this time. Patient was educated on benefits of participation in therapy. Patient continued to decline. OT to continue to follow and check back as schedule will allow.  Rennie Plowman, MS Acute Rehabilitation Department Office# [phone]  09/18/2022, 1:51 PM

## 2022-09-18 NOTE — TOC Progression Note (Signed)
Transition of Care [facility]) - Progression Note    Patient Details  Name: Brenin Heidelberger MRN: [id number] Date of Birth: Apr 28, 1956  Transition of Care [facility]) CM/SW Contact  Bekim Werntz, Juliann Pulse, RN Phone Number: 09/18/2022, 2:26 PM  Clinical Narrative:  Noted patient wishes. Palliative following.     Expected Discharge Plan: [place] Barriers to Discharge: Continued Medical Work up  Expected Discharge Plan and Services Expected Discharge Plan: [place]   Discharge Planning Services: CM Consult   Living arrangements for the past 2 months: Single Family Home                                       Social Determinants of Health (SDOH) Interventions    Readmission Risk Interventions    09/02/2022   10:36 AM  Readmission Risk Prevention Plan  Transportation Screening Complete  Medication Review (RN Care Manager) Complete  PCP or Specialist appointment within 3-5 days of discharge Complete  HRI or Home Care Consult Complete  SW Recovery Care/Counseling Consult Complete  Palliative Care Screening Not [place] Complete

## 2022-09-18 NOTE — Hospital Course (Addendum)
66 year old man who presented to the [facility] ED 11/11 with a chief complaint of hematuria. PMHx significant for HTN, HLD, HFrEF/ICM EF 40-45%, CAD/NSTEMI, TIA, PAF (on Eliquis), T2DM, BPH (hx prostate CA) with questionable length of time of hematuria which may have been going on since 10/13 per patient.  Sent to the emergency department by SNF staff on 11/11 and admitted.  Cultures obtained placed on antibiotics, patient had persistent hypotension pulmonary consulted was started on peripheral Levophed 11/12.  During his course patient has been seen by urology.  ID was also consulted patient was on IV antibiotics for 4 weeks.  Palliative care was consulted for symptom management and goals of care.  He was found to have shock with mixed picture septic from UTI cardiogenic shock and new depressed EF and hypovolemic shock. At this time he remains hemodynamically stable.  Completed antibiotics course.  PT OT following and awaiting for skilled nursing facility placement

## 2022-09-19 DIAGNOSIS — N179 Acute kidney failure, unspecified: Secondary | ICD-10-CM | POA: Diagnosis not present

## 2022-09-19 DIAGNOSIS — R310 Gross hematuria: Secondary | ICD-10-CM | POA: Diagnosis not present

## 2022-09-19 DIAGNOSIS — N1330 Unspecified hydronephrosis: Secondary | ICD-10-CM | POA: Diagnosis not present

## 2022-09-19 LAB — CBC
HCT: 26.2 % — ABNORMAL LOW (ref 39.0–52.0)
Hemoglobin: 7.7 g/dL — ABNORMAL LOW (ref 13.0–17.0)
MCH: 24 pg — ABNORMAL LOW (ref 26.0–34.0)
MCHC: 29.4 g/dL — ABNORMAL LOW (ref 30.0–36.0)
MCV: 81.6 fL (ref 80.0–100.0)
Platelets: 176 10*3/uL (ref 150–400)
RBC: 3.21 MIL/uL — ABNORMAL LOW (ref 4.22–5.81)
RDW: 17.1 % — ABNORMAL HIGH (ref 11.5–15.5)
WBC: 6.2 10*3/uL (ref 4.0–10.5)
nRBC: 0.3 % — ABNORMAL HIGH (ref 0.0–0.2)

## 2022-09-19 LAB — COMPREHENSIVE METABOLIC PANEL
ALT: 32 U/L (ref 0–44)
AST: 19 U/L (ref 15–41)
Albumin: 2.2 g/dL — ABNORMAL LOW (ref 3.5–5.0)
Alkaline Phosphatase: 68 U/L (ref 38–126)
Anion gap: 5 (ref 5–15)
BUN: 42 mg/dL — ABNORMAL HIGH (ref 8–23)
CO2: 21 mmol/L — ABNORMAL LOW (ref 22–32)
Calcium: 7.6 mg/dL — ABNORMAL LOW (ref 8.9–10.3)
Chloride: 108 mmol/L (ref 98–111)
Creatinine, Ser: 0.99 mg/dL (ref 0.61–1.24)
GFR, Estimated: >60 mL/min (ref >60)
Glucose, Bld: 160 mg/dL — ABNORMAL HIGH (ref 70–99)
Potassium: 4.2 mmol/L (ref 3.5–5.1)
Sodium: 134 mmol/L — ABNORMAL LOW (ref 135–145)
Total Bilirubin: 0.5 mg/dL (ref 0.3–1.2)
Total Protein: 4.4 g/dL — ABNORMAL LOW (ref 6.5–8.1)

## 2022-09-19 LAB — GLUCOSE, CAPILLARY
Glucose-Capillary: 120 mg/dL — ABNORMAL HIGH (ref 70–99)
Glucose-Capillary: 155 mg/dL — ABNORMAL HIGH (ref 70–99)
Glucose-Capillary: 179 mg/dL — ABNORMAL HIGH (ref 70–99)
Glucose-Capillary: 165 mg/dL — ABNORMAL HIGH (ref 70–99)

## 2022-09-19 MED ORDER — DROXIDOPA 100 MG PO CAPS
100.0000 mg | ORAL_CAPSULE | Freq: Two times a day (BID) | ORAL | Status: DC
  Administered 2022-09-20 – 2022-09-22 (×6): 100 mg via ORAL
  Filled 2022-09-19 (×7): qty 1

## 2022-09-19 NOTE — TOC Progression Note (Signed)
Transition of Care [facility]) - Progression Note    Patient Details  Name: Peter Yang MRN: [id number] Date of Birth: [birth year]  Transition of Care [facility]) CM/SW Contact  Jemina Scahill, Juliann Pulse, RN Phone Number: 09/19/2022, 1:19 PM  Clinical Narrative: Faxed out await bed offers per patient permission.      Expected Discharge Plan: [place] Barriers to Discharge: Continued Medical Work up  Expected Discharge Plan and Services Expected Discharge Plan: [place]   Discharge Planning Services: CM Consult   Living arrangements for the past 2 months: Single Family Home                                       Social Determinants of Health (SDOH) Interventions    Readmission Risk Interventions    09/02/2022   10:36 AM  Readmission Risk Prevention Plan  Transportation Screening Complete  Medication Review (RN Care Manager) Complete  PCP or Specialist appointment within 3-5 days of discharge Complete  HRI or Home Care Consult Complete  SW Recovery Care/Counseling Consult Complete  Palliative Care Screening Not [place] Complete

## 2022-09-19 NOTE — NC FL2 (Signed)
[place] LEVEL OF CARE FORM     IDENTIFICATION  Patient Name: Peter Yang Birthdate: 08-20-1956 Sex: male Admission Date (Current Location): 08/31/2022  [facility] and [place] Number:  Herbalist and Address:  [facility],  [place], [place]      Provider Number: [id number]  Attending Physician Name and Address:  Donne Hazel, MD  Relative Name and Phone Number:   Monico Hoar (Friend) [phone] (Mobile))    Current Level of Care: Hospital Recommended Level of Care: [place] Prior Approval Number:    Date Approved/Denied:   PASRR Number:  [id number])  Discharge Plan: SNF    Current Diagnoses: Patient Active Problem List   Diagnosis Date Noted   Counseling and coordination of care 09/15/2022   Acute on chronic systolic congestive heart failure ([place]) 09/15/2022   Pain 09/14/2022   High risk medication use 09/14/2022   Need for emotional support 09/12/2022   Mood altered 09/11/2022   Medication management 09/11/2022   AKI (acute kidney injury) ([place]) 09/10/2022   Palliative care by specialist 09/08/2022   Goals of care, counseling/discussion 09/08/2022   Cardiogenic shock ([place]) 09/05/2022   Physical deconditioning 09/05/2022   Hyponatremia 09/03/2022   Acute prostatitis 09/02/2022   Septic shock ([place]) 09/02/2022   Gross hematuria 08/31/2022   Lactic acidosis 08/31/2022   Cellulitis of scrotum 08/31/2022   Type 2 diabetes mellitus with diabetic peripheral angiopathy without gangrene, without long-term current use of insulin (HCC)    Acute kidney injury ([place])    Impaired functional mobility, balance, gait, and endurance 08/03/2022   Obstructive sleep apnea syndrome 08/03/2022   Pleural effusion, bilateral 08/03/2022   Catheter-associated urinary tract infection ([place]) 08/03/2022   Lab test positive for detection of COVID-19 virus 08/03/2022   History of urinary retention 08/03/2022    Mitral valve regurgitation 08/03/2022   UTI (urinary tract infection) 08/02/2022   Thoracic ascending aortic aneurysm ([place]) 07/23/2022   Borderline low blood pressure determined by examination 07/04/2022   Hematuria 06/25/2022   History of non-ST elevation myocardial infarction (NSTEMI) 06/15/2022   Paroxysmal atrial fibrillation ([place]) 06/15/2022   Dyslipidemia 06/15/2022   PVD (peripheral vascular disease) ([place]) 06/15/2022   Pressure ulcer of sacral region, stage 2 ([place]) 06/15/2022   Acute on chronic HFrEF (heart failure with reduced ejection fraction) ([place]) 06/15/2022   DNR (do not resuscitate) 06/15/2022   Ischemic cardiomyopathy 02/12/2022   Need for assistance with personal care 08/03/2021   Diabetic polyneuropathy associated with type 2 diabetes mellitus ([place]) 07/28/2021   Prolonged QT interval 07/28/2021   Moderate protein-calorie malnutrition ([place]) 06/29/2021   Chronic insomnia 03/31/2021   BPH with obstruction/lower urinary tract symptoms 02/02/2018   Hyperlipidemia 04/15/2016   Vitamin D deficiency 04/09/2016   Essential hypertension 01/25/2016    Orientation RESPIRATION BLADDER Height & Weight     Self, Time, Situation, Place    Indwelling catheter Weight: 117.8 kg Height:  '6\' 4"'$  (193 cm)  BEHAVIORAL SYMPTOMS/MOOD NEUROLOGICAL BOWEL NUTRITION STATUS      Incontinent Diet (CHO MOD)  AMBULATORY STATUS COMMUNICATION OF NEEDS Skin   Limited Assist Verbally PU Stage and Appropriate Care, Other (Comment) (R arm PICC line-iv abx ertapenem 1gm qd x 4weeks to end 09/27/22)   PU Stage 2 Dressing: Daily                   Personal Care Assistance Level of Assistance  Bathing, Feeding, Dressing Bathing Assistance: Limited assistance Feeding assistance:  Limited assistance Dressing Assistance: Limited assistance     Functional Limitations Info  Sight, Hearing, Speech Sight Info: Impaired (readers) Hearing Info: Adequate Speech Info: Adequate    SPECIAL CARE FACTORS  FREQUENCY  PT (By licensed PT), OT (By licensed OT)     PT Frequency:  (5x week) OT Frequency:  (5x week)            Contractures Contractures Info: Not present    Additional Factors Info  Code Status, Allergies, Isolation Precautions Code Status Info:  (DNR) Allergies Info:  (NKA)     Isolation Precautions Info:  (ESBL;contact precautions.)     Current Medications (09/19/2022):  This is the current hospital active medication list Current Facility-Administered Medications  Medication Dose Route Frequency Provider Last Rate Last Admin   0.9 %  sodium chloride infusion  250 mL Intravenous Continuous Rigoberto Noel, MD 10 mL/hr at 09/14/22 0248 250 mL at 09/14/22 0248   0.9 %  sodium chloride infusion   Intravenous PRN Shawna Clamp, MD 10 mL/hr at 09/11/22 0431 New Bag at 09/11/22 0431   0.9 %  sodium chloride infusion   Intra-arterial PRN Margaretha Seeds, MD       acetaminophen (TYLENOL) tablet 1,000 mg  1,000 mg Oral Q8H Mims, Lauren W, DO   1,000 mg at 09/18/22 2134   acetaminophen (TYLENOL) tablet 650 mg  650 mg Oral Q6H PRN Candee Furbish, MD   650 mg at 09/16/22 0957   apixaban (ELIQUIS) tablet 5 mg  5 mg Oral BID Suzzanne Cloud, RPH   5 mg at 09/19/22 0848   atorvastatin (LIPITOR) tablet 80 mg  80 mg Oral Daily Noe Gens L, NP   80 mg at 09/19/22 0848   Chlorhexidine Gluconate Cloth 2 % PADS 6 each  6 each Topical Daily Etta Quill, DO   6 each at 09/19/22 1050   clopidogrel (PLAVIX) tablet 75 mg  75 mg Oral Daily Barrett, Rhonda G, PA-C   75 mg at 09/19/22 0848   diclofenac Sodium (VOLTAREN) 1 % topical gel 2 g  2 g Topical TID PRN Omar Person, NP   2 g at 09/12/22 0207   droxidopa (NORTHERA) capsule 100 mg  100 mg Oral TID WC Nevada Crane M, PA-C   100 mg at 09/19/22 1243   ertapenem (INVANZ) 1,000 mg in sodium chloride 0.9 % 100 mL IVPB  1 g Intravenous Q24H Thayer Headings, MD   Stopped at 09/18/22 1640   finasteride (PROSCAR) tablet 5 mg  5 mg  Oral Daily Marton Redwood III, MD   5 mg at 09/19/22 0849   gabapentin (NEURONTIN) capsule 300 mg  300 mg Oral q1800 [name], DO   300 mg at 09/18/22 1834   gabapentin (NEURONTIN) capsule 300 mg  300 mg Oral Daily [name], DO   300 mg at 09/19/22 0848   hydrocortisone (CORTEF) tablet 20 mg  20 mg Oral Daily Nevada Crane M, PA-C   20 mg at 09/19/22 0848   And   hydrocortisone (CORTEF) tablet 10 mg  10 mg Oral Daily Nevada Crane M, PA-C   10 mg at 09/18/22 1835   insulin aspart (novoLOG) injection 0-5 Units  0-5 Units Subcutaneous QHS Julian Hy, DO   2 Units at 09/15/22 2208   insulin aspart (novoLOG) injection 0-9 Units  0-9 Units Subcutaneous TID WC Dwyane Dee, MD   2 Units at 09/19/22 1243   lidocaine (LIDODERM)  5 % 1 patch  1 patch Transdermal Daily [name], MD   1 patch at 09/15/22 1554   melatonin tablet 5 mg  5 mg Oral q1800 MimsMoody Bruins, DO   5 mg at 09/18/22 1828   midodrine (PROAMATINE) tablet 20 mg  20 mg Oral TID with meals Nevada Crane M, PA-C   20 mg at 09/19/22 1242   nystatin (MYCOSTATIN/NYSTOP) topical powder   Topical BID Margaretha Seeds, MD   Given at 09/19/22 1050   Oral care mouth rinse  15 mL Mouth Rinse PRN Etta Quill, DO       oxyCODONE (Oxy IR/ROXICODONE) immediate release tablet 5 mg  5 mg Oral Q4H PRN [name], DO   5 mg at 09/18/22 2235   pantoprazole (PROTONIX) EC tablet 40 mg  40 mg Oral Daily Ollis, Brandi L, NP   40 mg at 09/19/22 0848   polyvinyl alcohol (LIQUIFILM TEARS) 1.4 % ophthalmic solution 1 drop  1 drop Both Eyes PRN [name], MD   1 drop at 09/10/22 2140   sodium chloride flush (NS) 0.9 % injection 10-40 mL  10-40 mL Intracatheter Q12H Shawna Clamp, MD   40 mL at 09/18/22 2135   sodium chloride flush (NS) 0.9 % injection 10-40 mL  10-40 mL Intracatheter PRN Shawna Clamp, MD       tamsulosin [facility]) capsule 0.4 mg  0.4 mg Oral Daily Jennette Kettle M, DO   0.4 mg at 09/19/22 0848    traZODone (DESYREL) tablet 50 mg  50 mg Oral q1800 MimsMoody Bruins, DO   50 mg at 09/18/22 1835   zolpidem (AMBIEN) tablet 5 mg  5 mg Oral q1800 Mims, Moody Bruins, DO   5 mg at 09/18/22 1835     Discharge Medications: Please see discharge summary for a list of discharge medications.  Relevant Imaging Results:  Relevant Lab Results:   Additional Information SSN: [id number] (SSN: [id number];Long term iv abx-ertapenem 1gm qd end 09/27/22)  Dessa Phi, RN

## 2022-09-19 NOTE — Progress Notes (Signed)
PMT no charge note.   Patient has been seen for goals of care and symptom management. He has progressed well over recent days and TOC is working with him toward SNF placement. Discussed with TRH MD and will sign off.  Thank you for this opportunity to provide care to Peter Yang. Please contact PMT at [phone] for any additional needs. Loistine Chance MD [place] PMT  Moss Mc, RN MSN [facility] / NP Student Palliative Medicine Team
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
## 2022-09-19 NOTE — Progress Notes (Signed)
PT Cancellation Note  Patient Details Name: Jontavious Commons MRN: [id number] DOB: 1956/04/05   Cancelled Treatment:     Attempted to see pt twice today.  AM/PM pt declined any and all efforts to get OOB/sit EOB or perform any TE's.  "I just don't care to".  Pt present with poor engagement in conversation.  "I have nothing".  "I'm sorry".  Pt has been evaluated with rec for SNF.  Will continue to follow during his Acute Care stay and attempt another day.    Rica Koyanagi  PTA Acute  Rehabilitation Services Office M-F          [phone] Weekend pager [phone]

## 2022-09-20 DIAGNOSIS — N1330 Unspecified hydronephrosis: Secondary | ICD-10-CM | POA: Diagnosis not present

## 2022-09-20 DIAGNOSIS — N179 Acute kidney failure, unspecified: Secondary | ICD-10-CM | POA: Diagnosis not present

## 2022-09-20 DIAGNOSIS — R310 Gross hematuria: Secondary | ICD-10-CM | POA: Diagnosis not present

## 2022-09-20 LAB — CBC
HCT: 25.4 % — ABNORMAL LOW (ref 39.0–52.0)
Hemoglobin: 7.6 g/dL — ABNORMAL LOW (ref 13.0–17.0)
MCH: 24.2 pg — ABNORMAL LOW (ref 26.0–34.0)
MCHC: 29.9 g/dL — ABNORMAL LOW (ref 30.0–36.0)
MCV: 80.9 fL (ref 80.0–100.0)
Platelets: 179 10*3/uL (ref 150–400)
RBC: 3.14 MIL/uL — ABNORMAL LOW (ref 4.22–5.81)
RDW: 17.2 % — ABNORMAL HIGH (ref 11.5–15.5)
WBC: 7.7 10*3/uL (ref 4.0–10.5)
nRBC: 0 % (ref 0.0–0.2)

## 2022-09-20 LAB — COMPREHENSIVE METABOLIC PANEL
ALT: 29 U/L (ref 0–44)
AST: 18 U/L (ref 15–41)
Albumin: 2.3 g/dL — ABNORMAL LOW (ref 3.5–5.0)
Alkaline Phosphatase: 82 U/L (ref 38–126)
Anion gap: 5 (ref 5–15)
BUN: 38 mg/dL — ABNORMAL HIGH (ref 8–23)
CO2: 21 mmol/L — ABNORMAL LOW (ref 22–32)
Calcium: 7.6 mg/dL — ABNORMAL LOW (ref 8.9–10.3)
Chloride: 108 mmol/L (ref 98–111)
Creatinine, Ser: 0.99 mg/dL (ref 0.61–1.24)
GFR, Estimated: >60 mL/min (ref >60)
Glucose, Bld: 193 mg/dL — ABNORMAL HIGH (ref 70–99)
Potassium: 4.1 mmol/L (ref 3.5–5.1)
Sodium: 134 mmol/L — ABNORMAL LOW (ref 135–145)
Total Bilirubin: 0.6 mg/dL (ref 0.3–1.2)
Total Protein: 4.7 g/dL — ABNORMAL LOW (ref 6.5–8.1)

## 2022-09-20 LAB — GLUCOSE, CAPILLARY
Glucose-Capillary: 136 mg/dL — ABNORMAL HIGH (ref 70–99)
Glucose-Capillary: 174 mg/dL — ABNORMAL HIGH (ref 70–99)
Glucose-Capillary: 208 mg/dL — ABNORMAL HIGH (ref 70–99)
Glucose-Capillary: 208 mg/dL — ABNORMAL HIGH (ref 70–99)

## 2022-09-20 NOTE — Progress Notes (Signed)
Patient going to SNF.

## 2022-09-20 NOTE — TOC Progression Note (Signed)
Transition of Care [facility]) - Progression Note    Patient Details  Name: Peter Yang MRN: [id number] Date of Birth: 12/17/1955  Transition of Care [facility]) CM/SW Contact  Gerene Nedd, Juliann Pulse, RN Phone Number: 09/20/2022, 4:03 PM  Clinical Narrative:   Provided w/ST SNF bed choices await choice.    Expected Discharge Plan: [place] Barriers to Discharge: Continued Medical Work up  Expected Discharge Plan and Services Expected Discharge Plan: [place]   Discharge Planning Services: CM Consult   Living arrangements for the past 2 months: Single Family Home                                       Social Determinants of Health (SDOH) Interventions Transportation Interventions: Intervention Not Indicated Utilities Interventions: Intervention Not Indicated  Readmission Risk Interventions    09/02/2022   10:36 AM  Readmission Risk Prevention Plan  Transportation Screening Complete  Medication Review ([place]) Complete  PCP or Specialist appointment within 3-5 days of discharge Complete  HRI or Home Care Consult Complete  SW Recovery Care/Counseling Consult Complete  Palliative Care Screening Not [place] Complete

## 2022-09-20 NOTE — Progress Notes (Signed)
Patient wil be going to a rehab facility. Not a concern.

## 2022-09-20 NOTE — Progress Notes (Signed)
Progress Note   Patient: Peter Yang [id number]:[id number] DOB: [birth year] DOA: 08/31/2022     19 DOS: the patient was seen and examined on 09/20/2022   Brief hospital course: 66 year old man who presented to the [facility] ED 11/11 with a chief complaint of hematuria. PMHx significant for HTN, HLD, HFrEF/ICM EF 40-45%, CAD/NSTEMI, TIA, PAF (on Eliquis), T2DM, BPH (hx prostate CA).   Patient with questionable length of time of hematuria which may have been going on since 10/13 per patient.  Sent to the emergency department by SNF staff on 11/11.   At the emergency department he developed hypotension with a BP 83/63. WBC 14.2, LA 5.0 > 7.9. Code Sepsis was called. Blood cultures and urine cultures were obtained. Patient was given fluid bolus per sepsis guidelines.  Patient with complicated infectious history-started on meropenem and amoxicillin by TRH.  Urology was consulted for hematuria and concern for Fournier's.  Recommends monitoring with possible cystoscopy once Eliquis washout. TRH admitted patient.   11/11 Admit by TRH, BC>, UC>, PCCM consult, Mero and amoxicillin> 11/12 Started on peripheral Levophed for persistent hypotension, albumin 11/13 Troponin rising from 263 to 771, DVT/PE ruled out 11/14 dobutamine started for low coox, lower EF on echo 11/15 trial of restarting heparin 11/17 off dobutamine 11/19 coox 93, remains on pressors 11/20 Coox 67, CVP 8, on 3 norepi 11/21 Coox 75.1, 4 norepi  11/23 Norepi weaned to 75mg, hypotension into 80's overnight > increased levo. Robust UOP despite no lasix in 72 hours.   11/24 Levophed weaned off briefly yesterday and restarted overnight 11/26 Patient responsive to IVF resuscitation. Initial urine studies suggest hypovolemic hyponatremia 11/27 Ongoing mild hypotension, goal MAP 60. Remains on NE 296m. Midodrine increased, droxidopa added. Mild persistent hematuria with clots.    Assessment and Plan: Shock, mixed septic from UTI >cardiogenic shock  with new depressed EF>hypovolemic shock d/t overdiuresis Hypovolemic hyponatremia Clinically resolved infection and on appropriate antibiotics. Echo with EF 30% with concerns for cardiogenic shock however patient net negative volume. -Cardiology following intermittently while on any diuretics however still on droxidopa plus midodrine at this time - Continue hydrocortisone; decreased to '20mg'$  daily /'10mg'$  qhs as of 11/27 -Plan to ultimately taper midodrine, dose recently increased to '20mg'$  tid frrom '15mg'$  tid per PCCM on 11/27 -sbp noted to be in the 90's overnight, currently in the low-100's -Discussed with Critical Care who is familiar with patient's history. Pt historically has chronically low bp with sbp as low as 70-80's. Per Critical Care, recommendation to wean droxidopa to off. Now on BID dosing. Sbp in the 80's. Pt stable, asymptomatic   Acute on chronic HFrEF, ICM  CAD with stent in 05/2022 > 09/03/22 TTE> EF now 30-35, G2DD  pAF -Concerns for mixed cardiogenic shock picture - Cardiology consulted; not a candidate for home inotropes, revascularization, transplant or LVAD - Unable to tolerate GDMT due to persistent hypotension - Continue Plavix - Continue Eliquis -Cardiology had been following intermittently in case of being able to start any GDMT while on midodrine and droxidopa   Prostatitis Scrotal Cellulitis H/O ESBL E.Coli and CRE Proteus - ID consulted, appreciate recs -Last seen by ID on 09/05/2022.  Plan is for ertapenem 1 g daily for 4 weeks, end date 09/27/2022 - PICC in place  - Outpatient ID f/u, established in [place]   Hematuria due to UTI Known prostate cancer, under surveillance with urology - Trend H&H - Keep Foley until cleared by Urology post-discharge - Continue Eliquis/Plavix, monitor closely in the setting  of hematuria - Continue Flomax, Proscar   Hyponatremia, hypovolemic Mild AKI - improving Hypocalcemia  - Trend BMP - Replete electrolytes as  indicated - Monitor I&Os - Avoid nephrotoxic agents as able - Ensure adequate renal perfusion   DM2 -Continue Levemir - Continue SSI CBG monitoring -Also on prandial coverage   Deconditioning - PT/OT while in-house; recommended for SNF - Utilize prosthesis as able - TOC consulted; patient does NOT want to return to prior SNF [facility])     Chronic Stage 2 sacral ulcer, POA - WOC   Insomnia, phantom limb pain LLE - Continue reduced-dose gabapentin - Continue melatonin, trazodone, Ambien QHS PRN - Ortho tech consult for LLE sleeve   Chronic Critical Illness  - PMT consulted, appreciate recommendations - Patient acknowledges medical measures have not changed QOL - Continues to desire current scope of care - DNR code status      Subjective: Not very conversant at this time, focusing more on eating lunch.   Physical Exam: Vitals:   09/19/22 2140 09/20/22 0500 09/20/22 0648 09/20/22 1335  BP: 96/71  (!) 86/57 104/62  Pulse: 67  60 74  Resp: 16  19   Temp: 99 F (37.2 C)  98.2 F (36.8 C)   TempSrc: Oral  Oral   SpO2: 97%  99% 95%  Weight:  121.4 kg    Height:       General exam: Awake, laying in bed, in nad Respiratory system: Normal respiratory effort, no wheezing Cardiovascular system: regular rate, s1, s2 Gastrointestinal system: Soft, nondistended, positive BS Central nervous system: CN2-12 grossly intact, strength intact Extremities: Perfused, no clubbing Skin: Normal skin turgor, no notable skin lesions seen Psychiatry: Mood normal // no visual hallucinations   Data Reviewed:  Labs reviewed: Na 134, K 4.1, Cr 0.99, WBC 7.7, Hgb 7.6   Family Communication: Pt in room, family not at bedside  Disposition: Status is: Inpatient Remains inpatient appropriate because: Severity of illness  Planned Discharge Destination: Skilled nursing facility    Author: Marylu Lund, MD 09/20/2022 4:02 PM  For on call review [email or web address].

## 2022-09-21 DIAGNOSIS — N179 Acute kidney failure, unspecified: Secondary | ICD-10-CM | POA: Diagnosis not present

## 2022-09-21 DIAGNOSIS — N1330 Unspecified hydronephrosis: Secondary | ICD-10-CM | POA: Diagnosis not present

## 2022-09-21 DIAGNOSIS — R310 Gross hematuria: Secondary | ICD-10-CM | POA: Diagnosis not present

## 2022-09-21 LAB — CBC
HCT: 24.7 % — ABNORMAL LOW (ref 39.0–52.0)
Hemoglobin: 7.4 g/dL — ABNORMAL LOW (ref 13.0–17.0)
MCH: 24.3 pg — ABNORMAL LOW (ref 26.0–34.0)
MCHC: 30 g/dL (ref 30.0–36.0)
MCV: 81 fL (ref 80.0–100.0)
Platelets: 159 10*3/uL (ref 150–400)
RBC: 3.05 MIL/uL — ABNORMAL LOW (ref 4.22–5.81)
RDW: 17.2 % — ABNORMAL HIGH (ref 11.5–15.5)
WBC: 6 10*3/uL (ref 4.0–10.5)
nRBC: 0 % (ref 0.0–0.2)

## 2022-09-21 LAB — COMPREHENSIVE METABOLIC PANEL
ALT: 25 U/L (ref 0–44)
AST: 15 U/L (ref 15–41)
Albumin: 2.3 g/dL — ABNORMAL LOW (ref 3.5–5.0)
Alkaline Phosphatase: 72 U/L (ref 38–126)
Anion gap: 4 — ABNORMAL LOW (ref 5–15)
BUN: 37 mg/dL — ABNORMAL HIGH (ref 8–23)
CO2: 22 mmol/L (ref 22–32)
Calcium: 7.7 mg/dL — ABNORMAL LOW (ref 8.9–10.3)
Chloride: 110 mmol/L (ref 98–111)
Creatinine, Ser: 1 mg/dL (ref 0.61–1.24)
GFR, Estimated: >60 mL/min (ref >60)
Glucose, Bld: 195 mg/dL — ABNORMAL HIGH (ref 70–99)
Potassium: 4 mmol/L (ref 3.5–5.1)
Sodium: 136 mmol/L (ref 135–145)
Total Bilirubin: 0.4 mg/dL (ref 0.3–1.2)
Total Protein: 4.7 g/dL — ABNORMAL LOW (ref 6.5–8.1)

## 2022-09-21 LAB — GLUCOSE, CAPILLARY
Glucose-Capillary: 149 mg/dL — ABNORMAL HIGH (ref 70–99)
Glucose-Capillary: 117 mg/dL — ABNORMAL HIGH (ref 70–99)
Glucose-Capillary: 238 mg/dL — ABNORMAL HIGH (ref 70–99)
Glucose-Capillary: 190 mg/dL — ABNORMAL HIGH (ref 70–99)

## 2022-09-21 NOTE — Progress Notes (Signed)
Progress Note   Patient: Peter Yang WUJ:[id number] DOB: 16-Oct-1956 DOA: 08/31/2022     20 DOS: the patient was seen and examined on 09/21/2022   Brief hospital course: 66 year old man who presented to the [facility] ED 11/11 with a chief complaint of hematuria. PMHx significant for HTN, HLD, HFrEF/ICM EF 40-45%, CAD/NSTEMI, TIA, PAF (on Eliquis), T2DM, BPH (hx prostate CA).   Patient with questionable length of time of hematuria which may have been going on since 10/13 per patient.  Sent to the emergency department by SNF staff on 11/11.   At the emergency department he developed hypotension with a BP 83/63. WBC 14.2, LA 5.0 > 7.9. Code Sepsis was called. Blood cultures and urine cultures were obtained. Patient was given fluid bolus per sepsis guidelines.  Patient with complicated infectious history-started on meropenem and amoxicillin by TRH.  Urology was consulted for hematuria and concern for Fournier's.  Recommends monitoring with possible cystoscopy once Eliquis washout. TRH admitted patient.   11/11 Admit by TRH, BC>, UC>, PCCM consult, Mero and amoxicillin> 11/12 Started on peripheral Levophed for persistent hypotension, albumin 11/13 Troponin rising from 263 to 771, DVT/PE ruled out 11/14 dobutamine started for low coox, lower EF on echo 11/15 trial of restarting heparin 11/17 off dobutamine 11/19 coox 93, remains on pressors 11/20 Coox 67, CVP 8, on 3 norepi 11/21 Coox 75.1, 4 norepi  11/23 Norepi weaned to 44mg, hypotension into 80's overnight > increased levo. Robust UOP despite no lasix in 72 hours.   11/24 Levophed weaned off briefly yesterday and restarted overnight 11/26 Patient responsive to IVF resuscitation. Initial urine studies suggest hypovolemic hyponatremia 11/27 Ongoing mild hypotension, goal MAP 60. Remains on NE 242m. Midodrine increased, droxidopa added. Mild persistent hematuria with clots.    Assessment and Plan: Shock, mixed septic from UTI >cardiogenic shock  with new depressed EF>hypovolemic shock d/t overdiuresis Hypovolemic hyponatremia Clinically resolved infection and on appropriate antibiotics. Echo with EF 30% with concerns for cardiogenic shock however patient net negative volume. -Cardiology following intermittently while on any diuretics however still on droxidopa plus midodrine at this time - Continue hydrocortisone; decreased to '20mg'$  daily /'10mg'$  qhs as of 11/27 -Plan to ultimately taper midodrine, dose recently increased to '20mg'$  tid frrom '15mg'$  tid per PCCM on 11/27 -sbp noted to be in the 90's overnight, currently in the low-100's -Discussed with Critical Care who is familiar with patient's history. Pt historically has chronically low bp with sbp as low as 70-80's. Per Critical Care, recommendation to wean droxidopa to off. Currently weaning droxidopa, currently bid dosing   Acute on chronic HFrEF, ICM  CAD with stent in 05/2022 > 09/03/22 TTE> EF now 30-35, G2DD  pAF -Concerns for mixed cardiogenic shock picture - Cardiology consulted; not a candidate for home inotropes, revascularization, transplant or LVAD - Unable to tolerate GDMT due to persistent hypotension - Continue Plavix - Continue Eliquis -Cardiology had been following intermittently in case of being able to start any GDMT while on midodrine and droxidopa   Prostatitis Scrotal Cellulitis H/O ESBL E.Coli and CRE Proteus - ID consulted, appreciate recs -Last seen by ID on 09/05/2022.  Plan is for ertapenem 1 g daily for 4 weeks, end date 09/27/2022 - PICC in place  - Outpatient ID f/u, established in [place]   Hematuria due to UTI Known prostate cancer, under surveillance with urology - Trend H&H - Keep Foley until cleared by Urology post-discharge - Continue Eliquis/Plavix, monitor closely in the setting of hematuria - Continue Flomax,  Proscar   Hyponatremia, hypovolemic Mild AKI - improving Hypocalcemia  - Trend BMP - Replete electrolytes as indicated -  Monitor I&Os - Avoid nephrotoxic agents as able - Ensure adequate renal perfusion   DM2 -Continue Levemir - Continue SSI CBG monitoring -Also on prandial coverage   Deconditioning - PT/OT while in-house; recommended for SNF - Utilize prosthesis as able - TOC consulted; patient does NOT want to return to prior SNF [facility])     Chronic Stage 2 sacral ulcer, POA - WOC   Insomnia, phantom limb pain LLE - Continue reduced-dose gabapentin - Continue melatonin, trazodone, Ambien QHS PRN - Ortho tech consult for LLE sleeve   Chronic Critical Illness  - PMT consulted, appreciate recommendations - Patient acknowledges medical measures have not changed QOL - Continues to desire current scope of care - DNR code status      Subjective: asleep this AM. Arousable but not very conversant at all  Physical Exam: Vitals:   09/20/22 2207 09/21/22 0500 09/21/22 0658 09/21/22 1444  BP: 99/80  96/64 (!) 86/71  Pulse: 65  67 70  Resp: '18  18 16  '$ Temp: 98 F (36.7 C)  97.7 F (36.5 C) 98.1 F (36.7 C)  TempSrc:   Oral Oral  SpO2: 98%  97% 95%  Weight:  121.2 kg    Height:       General exam: arousable  in no acute distress Respiratory system: normal chest rise, clear, no audible wheezing Cardiovascular system: regular rhythm, s1-s2 Gastrointestinal system: Nondistended, nontender, pos BS Central nervous system: No seizures, no tremors Extremities: No cyanosis, no joint deformities Skin: No rashes, no pallor Psychiatry: Affect normal // no auditory hallucinations   Data Reviewed:  Labs reviewed: Na 136, K 4.0, Cr 1.00, WBC 6.0, Hgb 7.4   Family Communication: Pt in room, family not at bedside  Disposition: Status is: Inpatient Remains inpatient appropriate because: Severity of illness  Planned Discharge Destination: Skilled nursing facility    Author: Marylu Lund, MD 09/21/2022 5:34 PM  For on call review [email or web address].

## 2022-09-21 NOTE — Progress Notes (Signed)
Rounding Note    Patient Name: Peter Yang Date of Encounter: 09/21/2022  [place] Cardiologist: Pixie Casino, MD   Subjective   Pt deneis CP  Breathing he says is OK    Inpatient Medications    Scheduled Meds:  acetaminophen  1,000 mg Oral Q8H   apixaban  5 mg Oral BID   atorvastatin  80 mg Oral Daily   Chlorhexidine Gluconate Cloth  6 each Topical Daily   clopidogrel  75 mg Oral Daily   droxidopa  100 mg Oral BID WC   finasteride  5 mg Oral Daily   gabapentin  300 mg Oral q1800   gabapentin  300 mg Oral Daily   hydrocortisone  20 mg Oral Daily   And   hydrocortisone  10 mg Oral Daily   insulin aspart  0-5 Units Subcutaneous QHS   insulin aspart  0-9 Units Subcutaneous TID WC   lidocaine  1 patch Transdermal Daily   melatonin  5 mg Oral q1800   midodrine  20 mg Oral TID with meals   nystatin   Topical BID   pantoprazole  40 mg Oral Daily   sodium chloride flush  10-40 mL Intracatheter Q12H   tamsulosin  0.4 mg Oral Daily   traZODone  50 mg Oral q1800   zolpidem  5 mg Oral q1800   Continuous Infusions:  sodium chloride 250 mL (09/14/22 0248)   sodium chloride 10 mL/hr at 09/11/22 0431   sodium chloride     ertapenem 1,000 mg (09/20/22 1638)   PRN Meds: sodium chloride, Place/Maintain arterial line **AND** sodium chloride, acetaminophen, diclofenac Sodium, mouth rinse, oxyCODONE, polyvinyl alcohol, sodium chloride flush   Vital Signs    Vitals:   09/20/22 1335 09/20/22 2207 09/21/22 0500 09/21/22 0658  BP: 104/62 99/80  96/64  Pulse: 74 65  67  Resp:  18  18  Temp:  98 F (36.7 C)  97.7 F (36.5 C)  TempSrc:    Oral  SpO2: 95% 98%  97%  Weight:   121.2 kg   Height:        Intake/Output Summary (Last 24 hours) at 09/21/2022 0736 Last data filed at 09/21/2022 0600 Gross per 24 hour  Intake 440 ml  Output 2250 ml  Net -1810 ml      09/21/2022    5:00 AM 09/20/2022    5:00 AM 09/19/2022    4:09 AM  Last 3 Weights  Weight (lbs)  267 lb 4.9 oz 267 lb 10.2 oz 259 lb 11.2 oz  Weight (kg) 121.25 kg 121.4 kg 117.8 kg      Telemetry   NSR. - Personally Reviewed  ECG    No new ECG  - Personally Reviewed  Physical Exam   GEN: No acute distress.   Neck: JVP is increased Cardiac: RRR. III/VI systolic murmur  Radial pulses 2+ and equal bilaterally Respiratory: Relatively clear Decreased at bases GI: Soft, non-distended, and non-tender. MS: Trace  right lower extremity. S/p left below knee amputation. Neuro:  No focal deficits. Psych: Normal affect. Responds appropriately.  Labs    High Sensitivity Troponin:   Recent Labs  Lab 09/01/22 0040 09/02/22 1935  TROPONINIHS 263* 771*     Chemistry Recent Labs  Lab 09/19/22 0343 09/20/22 0305 09/21/22 0328  NA 134* 134* 136  K 4.2 4.1 4.0  CL 108 108 110  CO2 21* 21* 22  GLUCOSE 160* 193* 195*  BUN 42* 38* 37*  CREATININE  0.99 0.99 1.00  CALCIUM 7.6* 7.6* 7.7*  PROT 4.4* 4.7* 4.7*  ALBUMIN 2.2* 2.3* 2.3*  AST '19 18 15  '$ ALT 32 29 25  ALKPHOS 68 82 72  BILITOT 0.5 0.6 0.4  GFRNONAA >60 >60 >60  ANIONGAP 5 5 4*    Lipids No results for input(s): "CHOL", "TRIG", "HDL", "LABVLDL", "LDLCALC", "CHOLHDL" in the last 168 hours.  Hematology Recent Labs  Lab 09/19/22 0343 09/20/22 0305 09/21/22 0328  WBC 6.2 7.7 6.0  RBC 3.21* 3.14* 3.05*  HGB 7.7* 7.6* 7.4*  HCT 26.2* 25.4* 24.7*  MCV 81.6 80.9 81.0  MCH 24.0* 24.2* 24.3*  MCHC 29.4* 29.9* 30.0  RDW 17.1* 17.2* 17.2*  PLT 176 179 159   Thyroid  Recent Labs  Lab 09/15/22 0621 09/15/22 0928  TSH 3.797  --   FREET4  --  0.74    BNPNo results for input(s): "BNP", "PROBNP" in the last 168 hours.  DDimer No results for input(s): "DDIMER" in the last 168 hours.   Radiology    No results found.  Cardiac Studies   Left Cardiac Catheterization 06/17/2022: Conclusions: Severe multivessel coronary artery disease with severe calcification, as detailed below, including 50% mid LAD stenosis, 95%  stenosis of dominant branch of large D1 and chronic total occlusion of lateral branch of D1, chronic total occlusion of LCx, and chronic total occlusion of ostial through mid RCA (including stent placed in 01/2022) with left-to-right collaterals. Normal left ventricular filling pressure (LVEDP 10 mmHg).   Recommendations: Consider viability study with particular attention to the inferior wall.  If RCA territory is viable, cardiac surgery consultation for CABG should be considered.  If RCA territory is not viable (or the patient is not a candidate for CABG), medical therapy versus PCI to dominant branch of D1 (particularly if the patient reports recurrent angina) should be considered. Hold ticagrelor pending further workup and possible cardiac surgery consultation.  If the patient does not undergo CABG, I would favor 12 months of clopidogrel + DOAC therapy in the setting of new atrial fibrillation and NSTEMI. Aggressive secondary prevention of coronary artery disease.  Diagnostic Dominance: Right   _______________  Echocardiogram 09/03/2022: Impressions: 1. Left ventricular ejection fraction, by estimation, is 30 to 35%. The  left ventricle has moderately decreased function. The left ventricle  demonstrates regional wall motion abnormalities (see scoring  diagram/findings for description). The left  ventricular internal cavity size was mildly dilated. Left ventricular  diastolic parameters are consistent with Grade II diastolic dysfunction  (pseudonormalization).   2. Right ventricular systolic function is moderately reduced. The right  ventricular size is mildly enlarged. There is mildly elevated pulmonary  artery systolic pressure. The estimated right ventricular systolic  pressure is 51.7 mmHg.   3. Left atrial size was moderately dilated.   4. Right atrial size was mildly dilated.   5. The mitral valve is abnormal. Moderate mitral valve regurgitation. No  evidence of mitral stenosis.    6. The aortic valve is tricuspid. There is mild calcification of the  aortic valve. Aortic valve regurgitation is not visualized. Aortic valve  sclerosis is present, with no evidence of aortic valve stenosis.   7. Aortic dilatation noted. There is mild dilatation of the aortic root,  measuring 42 mm.   8. The inferior vena cava is normal in size with <50% respiratory  variability, suggesting right atrial pressure of 8 mmHg.   Comparison(s): Changes from prior study are noted. EF significantly  reduced compared to prior, now  with more prominent anterior wall motion  abnormalities. Findings communicated to PCCM team.   Conclusion(s)/Recommendation(s): No left ventricular mural or apical  thrombus/thrombi.    Patient Profile     66 y.o. male with a history of CAD prior PTCA and PCI of RCA in 01/2022 and recent NSTEMI in 05/2022 where he was noted to have severe multivessel disease with CTO of RCA, LCX, and branch of D1 (not felt to be a candidate for CABG or PCI and treated medically). 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 He has had multiple admission over the last 3 months for multiple medical issues including acute metabolic encephalopathy/ altered mental status, hypoglycemia, hematuria, complicated UTI, and urinary retention. He was readmitted on 08/31/2022 for hematuria and AKI (felt to be pre-renal/ATN vs postrenal/obstructive in setting of ongoing hematuria with clots requiring Foley placement and irrigation. He was also felt to have septic shock secondary to UTI requiring multiple pressors. Hospital course has been complicated by pressure ulcer of sacrum, cellulitis of scrotum, hyperkalemia requiring  Lokelma, metabolic acidosis, and hyponatremia. He was initially started on IV fluids but then felt to be volume overloaded and was treated with IV Lasix. Cardiology consulted for further evaluation of acute on chronic CHF.  Assessment & Plan    1  HFrEF     Ischemic cardiomyopathy   Biventricular dysfunction  (see echo above) Volume status is not severely overloaded      Rx limited by hypotension       Follow I/O and exam   No changes for now    Would repeat PA / Lateral CXR - Patient is not felt to be candidate for home inotropes or advanced heart failure therapies. NO plans to pursue invasive cardiac work-up. Would consider R heart cath to determine intravascular pressures given needs to middrine and droxidopa    Keep on current regimen for now     Type 2 NSTEMI Multiple Vessel CAD History of prior PCI to RCA in 01/2022 at an outside facility. Admitted to [facility] in 05/2022 for NSTEMI and found to have severe multivessel CAD with CTO of ostial to mid RCA, CTO of LCX, and CTO of lateral branch of D1. Not felt to be candidate for CABG or PCI and treated medically. High-sensitivity troponin 263 >> 771 this admission likely due to demand ischemic from septic shock. - No ischemic symptoms. - Continue Plavix. No aspirin due to need for Eliquis. - No beta-blocker due to hypotension. - Continue high-intensity statin.  Hypotension Pt currently on midodrine 20 tid and droxidopa    Paroxysmal Atrial Fibrillation Maintaining sinus rhythm  with occasional PVCs. - No beta-blockers due to hypotension. - Continue Eliquis '5mg'$  twice daily.   For questions or updates, please contact [place] Please consult [email or web address] for contact info under        Signed, Dorris Carnes, MD  09/21/2022, 7:36 AM

## 2022-09-22 DIAGNOSIS — R310 Gross hematuria: Secondary | ICD-10-CM | POA: Diagnosis not present

## 2022-09-22 DIAGNOSIS — N179 Acute kidney failure, unspecified: Secondary | ICD-10-CM | POA: Diagnosis not present

## 2022-09-22 DIAGNOSIS — N1330 Unspecified hydronephrosis: Secondary | ICD-10-CM | POA: Diagnosis not present

## 2022-09-22 LAB — CBC
HCT: 25.7 % — ABNORMAL LOW (ref 39.0–52.0)
Hemoglobin: 7.5 g/dL — ABNORMAL LOW (ref 13.0–17.0)
MCH: 23.7 pg — ABNORMAL LOW (ref 26.0–34.0)
MCHC: 29.2 g/dL — ABNORMAL LOW (ref 30.0–36.0)
MCV: 81.3 fL (ref 80.0–100.0)
Platelets: 149 10*3/uL — ABNORMAL LOW (ref 150–400)
RBC: 3.16 MIL/uL — ABNORMAL LOW (ref 4.22–5.81)
RDW: 17.3 % — ABNORMAL HIGH (ref 11.5–15.5)
WBC: 6.6 10*3/uL (ref 4.0–10.5)
nRBC: 0 % (ref 0.0–0.2)

## 2022-09-22 LAB — COMPREHENSIVE METABOLIC PANEL
ALT: 23 U/L (ref 0–44)
AST: 16 U/L (ref 15–41)
Albumin: 2.4 g/dL — ABNORMAL LOW (ref 3.5–5.0)
Alkaline Phosphatase: 78 U/L (ref 38–126)
Anion gap: 5 (ref 5–15)
BUN: 34 mg/dL — ABNORMAL HIGH (ref 8–23)
CO2: 21 mmol/L — ABNORMAL LOW (ref 22–32)
Calcium: 7.8 mg/dL — ABNORMAL LOW (ref 8.9–10.3)
Chloride: 111 mmol/L (ref 98–111)
Creatinine, Ser: 0.89 mg/dL (ref 0.61–1.24)
GFR, Estimated: >60 mL/min (ref >60)
Glucose, Bld: 200 mg/dL — ABNORMAL HIGH (ref 70–99)
Potassium: 4.1 mmol/L (ref 3.5–5.1)
Sodium: 137 mmol/L (ref 135–145)
Total Bilirubin: 0.5 mg/dL (ref 0.3–1.2)
Total Protein: 4.9 g/dL — ABNORMAL LOW (ref 6.5–8.1)

## 2022-09-22 LAB — GLUCOSE, CAPILLARY
Glucose-Capillary: 141 mg/dL — ABNORMAL HIGH (ref 70–99)
Glucose-Capillary: 157 mg/dL — ABNORMAL HIGH (ref 70–99)
Glucose-Capillary: 182 mg/dL — ABNORMAL HIGH (ref 70–99)
Glucose-Capillary: 207 mg/dL — ABNORMAL HIGH (ref 70–99)

## 2022-09-22 MED ORDER — FUROSEMIDE 10 MG/ML IJ SOLN
60.0000 mg | Freq: Once | INTRAMUSCULAR | Status: AC
  Administered 2022-09-22: 60 mg via INTRAVENOUS
  Filled 2022-09-22: qty 6

## 2022-09-22 MED ORDER — MIDODRINE HCL 5 MG PO TABS
10.0000 mg | ORAL_TABLET | Freq: Three times a day (TID) | ORAL | Status: DC
  Administered 2022-09-22 – 2022-10-18 (×79): 10 mg via ORAL
  Filled 2022-09-22 (×78): qty 2

## 2022-09-22 NOTE — Progress Notes (Signed)
Progress Note   Patient: Peter Yang [id number]:[id number] DOB: 1955/12/30 DOA: 08/31/2022     21 DOS: the patient was seen and examined on 09/22/2022   Brief hospital course: 66 year old man who presented to the [facility] ED 11/11 with a chief complaint of hematuria. PMHx significant for HTN, HLD, HFrEF/ICM EF 40-45%, CAD/NSTEMI, TIA, PAF (on Eliquis), T2DM, BPH (hx prostate CA).   Patient with questionable length of time of hematuria which may have been going on since 10/13 per patient.  Sent to the emergency department by SNF staff on 11/11.   At the emergency department he developed hypotension with a BP 83/63. WBC 14.2, LA 5.0 > 7.9. Code Sepsis was called. Blood cultures and urine cultures were obtained. Patient was given fluid bolus per sepsis guidelines.  Patient with complicated infectious history-started on meropenem and amoxicillin by TRH.  Urology was consulted for hematuria and concern for Fournier's.  Recommends monitoring with possible cystoscopy once Eliquis washout. TRH admitted patient.   11/11 Admit by TRH, BC>, UC>, PCCM consult, Mero and amoxicillin> 11/12 Started on peripheral Levophed for persistent hypotension, albumin 11/13 Troponin rising from 263 to 771, DVT/PE ruled out 11/14 dobutamine started for low coox, lower EF on echo 11/15 trial of restarting heparin 11/17 off dobutamine 11/19 coox 93, remains on pressors 11/20 Coox 67, CVP 8, on 3 norepi 11/21 Coox 75.1, 4 norepi  11/23 Norepi weaned to 13mg, hypotension into 80's overnight > increased levo. Robust UOP despite no lasix in 72 hours.   11/24 Levophed weaned off briefly yesterday and restarted overnight 11/26 Patient responsive to IVF resuscitation. Initial urine studies suggest hypovolemic hyponatremia 11/27 Ongoing mild hypotension, goal MAP 60. Remains on NE 241m. Midodrine increased, droxidopa added. Mild persistent hematuria with clots.    Assessment and Plan: Shock, mixed septic from UTI >cardiogenic shock  with new depressed EF>hypovolemic shock d/t overdiuresis Hypovolemic hyponatremia Clinically resolved infection and on appropriate antibiotics. Echo with EF 30% with concerns for cardiogenic shock however patient net negative volume. -Cardiology following intermittently while on any diuretics however still on droxidopa plus midodrine at this time - Continue hydrocortisone; decreased to '20mg'$  daily /'10mg'$  qhs as of 11/27 -Plan to ultimately taper midodrine, dose recently increased to '20mg'$  tid frrom '15mg'$  tid per PCCM on 11/27. Cardiology recommends changing midodrine to '10mg'$  TID -sbp noted to be in the 90's overnight, currently in the low-100's -Discussed with Critical Care who is familiar with patient's history. Pt historically has chronically low bp with sbp as low as 70-80's. Per Critical Care, recommendation to wean droxidopa to off.  -In the process of weaning droxidopa, anticipate stopping soon   Acute on chronic HFrEF, ICM  CAD with stent in 05/2022 > 09/03/22 TTE> EF now 30-35, G2DD  pAF -Concerns for mixed cardiogenic shock picture - Cardiology consulted; not a candidate for home inotropes, revascularization, transplant or LVAD - Unable to tolerate GDMT due to persistent hypotension - Continue Plavix - Continue Eliquis -Cardiology had been following intermittently in case of being able to start any GDMT while on midodrine and droxidopa   Prostatitis Scrotal Cellulitis H/O ESBL E.Coli and CRE Proteus - ID consulted, appreciate recs -Last seen by ID on 09/05/2022.  Plan is for ertapenem 1 g daily for 4 weeks, end date 09/27/2022 - PICC in place  - Outpatient ID f/u, established in [place]   Hematuria due to UTI Known prostate cancer, under surveillance with urology - Trend H&H - Keep Foley until cleared by Urology post-discharge - Continue  Eliquis/Plavix, monitor closely in the setting of hematuria - Continue Flomax, Proscar   Hyponatremia, hypovolemic Mild AKI -  improving Hypocalcemia  - Trend BMP - Replete electrolytes as indicated - Monitor I&Os - Avoid nephrotoxic agents as able - Ensure adequate renal perfusion   DM2 -Continue Levemir - Continue SSI CBG monitoring -Also on prandial coverage   Deconditioning - PT/OT while in-house; recommended for SNF - Utilize prosthesis as able - TOC consulted; patient does NOT want to return to prior SNF [facility])     Chronic Stage 2 sacral ulcer, POA - WOC   Insomnia, phantom limb pain LLE - Continue reduced-dose gabapentin - Continue melatonin, trazodone, Ambien QHS PRN - Ortho tech consult for LLE sleeve   Chronic Critical Illness  - PMT consulted, appreciate recommendations - Patient acknowledges medical measures have not changed QOL - Continues to desire current scope of care - DNR code status      Subjective: No complaints. Not very conversant  Physical Exam: Vitals:   09/21/22 2148 09/21/22 2231 09/22/22 0607 09/22/22 1457  BP: 94/73  98/67 108/65  Pulse: 67  61 67  Resp: '20 18 20 20  '$ Temp: 98.3 F (36.8 C)  98.4 F (36.9 C)   TempSrc: Oral  Oral   SpO2: 95%  98% 99%  Weight:      Height:       General exam: Awake, laying in bed, in nad Respiratory system: Normal respiratory effort, no wheezing Cardiovascular system: regular rate, s1, s2 Gastrointestinal system: Soft, nondistended, positive BS Central nervous system: CN2-12 grossly intact, strength intact Extremities: Perfused, no clubbing Skin: Normal skin turgor, no notable skin lesions seen Psychiatry: flat affect, appears annoyed  Data Reviewed:  Labs reviewed: Na 137, K 4.1, Cr 0.89, WBC 6.6, Hgb 7.5   Family Communication: Pt in room, family not at bedside  Disposition: Status is: Inpatient Remains inpatient appropriate because: Severity of illness  Planned Discharge Destination: Skilled nursing facility    Author: Marylu Lund, MD 09/22/2022 4:07 PM  For on call review [email or web address].

## 2022-09-22 NOTE — TOC Progression Note (Signed)
Transition of Care [facility]) - Progression Note    Patient Details  Name: Farhan Jean MRN: [id number] Date of Birth: 09/19/1956  Transition [facility]) CM/SW Contact  Hien Perreira, Juliann Pulse, RN Phone Number: 09/22/2022, 4:22 PM  Clinical Narrative:  patient states his caregiver is helping him choose a ST SNF-await bed choice.     Expected Discharge Plan: [place] Barriers to Discharge: Continued Medical Work up  Expected Discharge Plan and Services Expected Discharge Plan: [place]   Discharge Planning Services: CM Consult   Living arrangements for the past 2 months: Single Family Home                                       Social Determinants of Health (SDOH) Interventions Transportation Interventions: Intervention Not Indicated Utilities Interventions: Intervention Not Indicated  Readmission Risk Interventions    09/02/2022   10:36 AM  Readmission Risk Prevention Plan  Transportation Screening Complete  Medication Review ([place]) Complete  PCP or Specialist appointment within 3-5 days of discharge Complete  HRI or Home Care Consult Complete  SW Recovery Care/Counseling Consult Complete  Palliative Care Screening Not [place] Complete

## 2022-09-22 NOTE — Progress Notes (Addendum)
Rounding Note    Patient Name: Peter Yang Date of Encounter: 09/22/2022  [place] Cardiologist: Pixie Casino, MD   Subjective   Pt deneis CP  Breathing is a little tight today   Inpatient Medications    Scheduled Meds:  acetaminophen  1,000 mg Oral Q8H   apixaban  5 mg Oral BID   atorvastatin  80 mg Oral Daily   Chlorhexidine Gluconate Cloth  6 each Topical Daily   clopidogrel  75 mg Oral Daily   droxidopa  100 mg Oral BID WC   finasteride  5 mg Oral Daily   gabapentin  300 mg Oral q1800   gabapentin  300 mg Oral Daily   hydrocortisone  20 mg Oral Daily   And   hydrocortisone  10 mg Oral Daily   insulin aspart  0-5 Units Subcutaneous QHS   insulin aspart  0-9 Units Subcutaneous TID WC   lidocaine  1 patch Transdermal Daily   melatonin  5 mg Oral q1800   midodrine  20 mg Oral TID with meals   nystatin   Topical BID   pantoprazole  40 mg Oral Daily   sodium chloride flush  10-40 mL Intracatheter Q12H   tamsulosin  0.4 mg Oral Daily   traZODone  50 mg Oral q1800   zolpidem  5 mg Oral q1800   Continuous Infusions:  sodium chloride 250 mL (09/14/22 0248)   sodium chloride 10 mL/hr at 09/11/22 0431   sodium chloride     ertapenem 1,000 mg (09/21/22 1710)   PRN Meds: sodium chloride, Place/Maintain arterial line **AND** sodium chloride, acetaminophen, diclofenac Sodium, mouth rinse, oxyCODONE, polyvinyl alcohol, sodium chloride flush   Vital Signs    Vitals:   09/21/22 1444 09/21/22 2148 09/21/22 2231 09/22/22 0607  BP: (!) 86/71 94/73  98/67  Pulse: 70 67  61  Resp: '16 20 18 20  '$ Temp: 98.1 F (36.7 C) 98.3 F (36.8 C)  98.4 F (36.9 C)  TempSrc: Oral Oral  Oral  SpO2: 95% 95%  98%  Weight:      Height:        Intake/Output Summary (Last 24 hours) at 09/22/2022 0735 Last data filed at 09/22/2022 0646 Gross per 24 hour  Intake 80 ml  Output 600 ml  Net -520 ml      09/21/2022    5:00 AM 09/20/2022    5:00 AM 09/19/2022    4:09 AM   Last 3 Weights  Weight (lbs) 267 lb 4.9 oz 267 lb 10.2 oz 259 lb 11.2 oz  Weight (kg) 121.25 kg 121.4 kg 117.8 kg      Telemetry   NSR 60s  . - Personally Reviewed  ECG    No new  - Personally Reviewed  Physical Exam   GEN: No acute distress.   Neck: JVP is increased Cardiac: RRR. III/VI systolic murmur   Respiratory: Decreased at bases GI: Soft, non-distended, and non-tender. MS: no   right lower extremity. S/p left below knee amputation. Neuro:  No focal deficits. Psych:  Short in responses  Closes eyes near end of discussion, stopped talking   Labs    High Sensitivity Troponin:   Recent Labs  Lab 09/01/22 0040 09/02/22 1935  TROPONINIHS 263* 771*     Chemistry Recent Labs  Lab 09/20/22 0305 09/21/22 0328 09/22/22 0034  NA 134* 136 137  K 4.1 4.0 4.1  CL 108 110 111  CO2 21* 22 21*  GLUCOSE 193*  195* 200*  BUN 38* 37* 34*  CREATININE 0.99 1.00 0.89  CALCIUM 7.6* 7.7* 7.8*  PROT 4.7* 4.7* 4.9*  ALBUMIN 2.3* 2.3* 2.4*  AST '18 15 16  '$ ALT '29 25 23  '$ ALKPHOS 82 72 78  BILITOT 0.6 0.4 0.5  GFRNONAA >60 >60 >60  ANIONGAP 5 4* 5    Lipids No results for input(s): "CHOL", "TRIG", "HDL", "LABVLDL", "LDLCALC", "CHOLHDL" in the last 168 hours.  Hematology Recent Labs  Lab 09/20/22 0305 09/21/22 0328 09/22/22 0034  WBC 7.7 6.0 6.6  RBC 3.14* 3.05* 3.16*  HGB 7.6* 7.4* 7.5*  HCT 25.4* 24.7* 25.7*  MCV 80.9 81.0 81.3  MCH 24.2* 24.3* 23.7*  MCHC 29.9* 30.0 29.2*  RDW 17.2* 17.2* 17.3*  PLT 179 159 149*   Thyroid  Recent Labs  Lab 09/15/22 0928  FREET4 0.74    BNPNo results for input(s): "BNP", "PROBNP" in the last 168 hours.  DDimer No results for input(s): "DDIMER" in the last 168 hours.   Radiology    No results found.  Cardiac Studies   Left Cardiac Catheterization 06/17/2022: Conclusions: Severe multivessel coronary artery disease with severe calcification, as detailed below, including 50% mid LAD stenosis, 95% stenosis of dominant  branch of large D1 and chronic total occlusion of lateral branch of D1, chronic total occlusion of LCx, and chronic total occlusion of ostial through mid RCA (including stent placed in 01/2022) with left-to-right collaterals. Normal left ventricular filling pressure (LVEDP 10 mmHg).   Recommendations: Consider viability study with particular attention to the inferior wall.  If RCA territory is viable, cardiac surgery consultation for CABG should be considered.  If RCA territory is not viable (or the patient is not a candidate for CABG), medical therapy versus PCI to dominant branch of D1 (particularly if the patient reports recurrent angina) should be considered. Hold ticagrelor pending further workup and possible cardiac surgery consultation.  If the patient does not undergo CABG, I would favor 12 months of clopidogrel + DOAC therapy in the setting of new atrial fibrillation and NSTEMI. Aggressive secondary prevention of coronary artery disease.  Diagnostic Dominance: Right   _______________  Echocardiogram 09/03/2022: Impressions: 1. Left ventricular ejection fraction, by estimation, is 30 to 35%. The  left ventricle has moderately decreased function. The left ventricle  demonstrates regional wall motion abnormalities (see scoring  diagram/findings for description). The left  ventricular internal cavity size was mildly dilated. Left ventricular  diastolic parameters are consistent with Grade II diastolic dysfunction  (pseudonormalization).   2. Right ventricular systolic function is moderately reduced. The right  ventricular size is mildly enlarged. There is mildly elevated pulmonary  artery systolic pressure. The estimated right ventricular systolic  pressure is 41.3 mmHg.   3. Left atrial size was moderately dilated.   4. Right atrial size was mildly dilated.   5. The mitral valve is abnormal. Moderate mitral valve regurgitation. No  evidence of mitral stenosis.   6. The aortic  valve is tricuspid. There is mild calcification of the  aortic valve. Aortic valve regurgitation is not visualized. Aortic valve  sclerosis is present, with no evidence of aortic valve stenosis.   7. Aortic dilatation noted. There is mild dilatation of the aortic root,  measuring 42 mm.   8. The inferior vena cava is normal in size with <50% respiratory  variability, suggesting right atrial pressure of 8 mmHg.   Comparison(s): Changes from prior study are noted. EF significantly  reduced compared to prior, now with more  prominent anterior wall motion  abnormalities. Findings communicated to PCCM team.   Conclusion(s)/Recommendation(s): No left ventricular mural or apical  thrombus/thrombi.    Patient Profile     67 y.o. male with a history of CAD prior PTCA and PCI of RCA in 01/2022 and recent NSTEMI in 05/2022 where he was noted to have severe multivessel disease with CTO of RCA, LCX, and branch of D1 (not felt to be a candidate for CABG or PCI and treated medically). 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 He has had multiple admission over the last 3 months for multiple medical issues including acute metabolic encephalopathy/ altered mental status, hypoglycemia, hematuria, complicated UTI, and urinary retention. He was readmitted on 08/31/2022 for hematuria and AKI (felt to be pre-renal/ATN vs postrenal/obstructive in setting of ongoing hematuria with clots requiring Foley placement and irrigation. He was also felt to have septic shock secondary to UTI requiring multiple pressors. Hospital course has been complicated by pressure ulcer of sacrum, cellulitis of scrotum, hyperkalemia requiring Lokelma, metabolic  acidosis, and hyponatremia. He was initially started on IV fluids but then felt to be volume overloaded and was treated with IV Lasix. Cardiology consulted for further evaluation of acute on chronic CHF.  Assessment & Plan    1  HFrEF     Ischemic cardiomyopathy   Biventricular dysfunction  (see echo above) - Patient is not felt to be candidate for home inotropes or advanced heart failure therapies. NO plans to pursue invasive cardiac work-up. Volume status is appears mildly increased     Treatment so far has been limited by hypotension   Off of all meds       Will give IV lasix x 1    Follow response  . Would consider R heart cath to determine intravascular pressures given needs to middrine and droxidopa     Pt closed eyes near end of discussion   Did nto say more    Type 2 NSTEMI Multiple Vessel CAD History of prior PCI to RCA in 01/2022 at an outside facility. Admitted to [facility] in 05/2022 for NSTEMI and found to have severe multivessel CAD with CTO of ostial to mid RCA, CTO of LCX, and CTO of lateral branch of D1. Not felt to be candidate for CABG or PCI and treated medically. High-sensitivity troponin 263 >> 771 this admission likely due to demand ischemic from septic shock. - No ischemic symptoms. - Continue Plavix. No aspirin due to need for Eliquis. - No beta-blocker due to hypotension. - Continue high-intensity statin.  Hypotension Pt currently on midodrine 20 tid and droxidopa and  solucortef      Droxidopa being weaned   I would switch midodrine to 10 tid     Midodrine added to pt's regimen in October   Prior to htat he had been on ACEI  Paroxysmal Atrial Fibrillation Maintaining sinus rhythm  with occasional PVCs. - No beta-blockers due to hypotension. - Continue Eliquis '5mg'$  twice daily.   For questions or updates, please contact [place] Please consult [email or web address] for contact info under        Signed, Dorris Carnes, MD  09/22/2022, 7:35 AM

## 2022-09-23 DIAGNOSIS — I480 Paroxysmal atrial fibrillation: Secondary | ICD-10-CM | POA: Diagnosis not present

## 2022-09-23 DIAGNOSIS — I2583 Coronary atherosclerosis due to lipid rich plaque: Secondary | ICD-10-CM

## 2022-09-23 DIAGNOSIS — R310 Gross hematuria: Secondary | ICD-10-CM | POA: Diagnosis not present

## 2022-09-23 DIAGNOSIS — I5020 Unspecified systolic (congestive) heart failure: Secondary | ICD-10-CM

## 2022-09-23 DIAGNOSIS — I9589 Other hypotension: Secondary | ICD-10-CM | POA: Diagnosis not present

## 2022-09-23 DIAGNOSIS — N1330 Unspecified hydronephrosis: Secondary | ICD-10-CM | POA: Diagnosis not present

## 2022-09-23 DIAGNOSIS — N179 Acute kidney failure, unspecified: Secondary | ICD-10-CM | POA: Diagnosis not present

## 2022-09-23 DIAGNOSIS — I2510 Atherosclerotic heart disease of native coronary artery without angina pectoris: Secondary | ICD-10-CM | POA: Diagnosis not present

## 2022-09-23 LAB — COMPREHENSIVE METABOLIC PANEL
ALT: 18 U/L (ref 0–44)
AST: 14 U/L — ABNORMAL LOW (ref 15–41)
Albumin: 2.2 g/dL — ABNORMAL LOW (ref 3.5–5.0)
Alkaline Phosphatase: 71 U/L (ref 38–126)
Anion gap: 5 (ref 5–15)
BUN: 34 mg/dL — ABNORMAL HIGH (ref 8–23)
CO2: 21 mmol/L — ABNORMAL LOW (ref 22–32)
Calcium: 7.7 mg/dL — ABNORMAL LOW (ref 8.9–10.3)
Chloride: 109 mmol/L (ref 98–111)
Creatinine, Ser: 0.95 mg/dL (ref 0.61–1.24)
GFR, Estimated: >60 mL/min (ref >60)
Glucose, Bld: 200 mg/dL — ABNORMAL HIGH (ref 70–99)
Potassium: 3.8 mmol/L (ref 3.5–5.1)
Sodium: 135 mmol/L (ref 135–145)
Total Bilirubin: 0.3 mg/dL (ref 0.3–1.2)
Total Protein: 4.6 g/dL — ABNORMAL LOW (ref 6.5–8.1)

## 2022-09-23 LAB — GLUCOSE, CAPILLARY
Glucose-Capillary: 152 mg/dL — ABNORMAL HIGH (ref 70–99)
Glucose-Capillary: 136 mg/dL — ABNORMAL HIGH (ref 70–99)
Glucose-Capillary: 203 mg/dL — ABNORMAL HIGH (ref 70–99)
Glucose-Capillary: 167 mg/dL — ABNORMAL HIGH (ref 70–99)

## 2022-09-23 LAB — CBC
HCT: 25.5 % — ABNORMAL LOW (ref 39.0–52.0)
Hemoglobin: 7.4 g/dL — ABNORMAL LOW (ref 13.0–17.0)
MCH: 23.3 pg — ABNORMAL LOW (ref 26.0–34.0)
MCHC: 29 g/dL — ABNORMAL LOW (ref 30.0–36.0)
MCV: 80.2 fL (ref 80.0–100.0)
Platelets: 125 10*3/uL — ABNORMAL LOW (ref 150–400)
RBC: 3.18 MIL/uL — ABNORMAL LOW (ref 4.22–5.81)
RDW: 17.5 % — ABNORMAL HIGH (ref 11.5–15.5)
WBC: 6.5 10*3/uL (ref 4.0–10.5)
nRBC: 0 % (ref 0.0–0.2)

## 2022-09-23 MED ORDER — DROXIDOPA 100 MG PO CAPS
100.0000 mg | ORAL_CAPSULE | Freq: Every day | ORAL | Status: DC
  Administered 2022-09-23 – 2022-09-24 (×2): 100 mg via ORAL
  Filled 2022-09-23 (×2): qty 1

## 2022-09-23 NOTE — Progress Notes (Signed)
Progress Note   Patient: Peter Yang MGQ:[id number] DOB: 02-Sep-1956 DOA: 08/31/2022     22 DOS: the patient was seen and examined on 09/23/2022   Brief hospital course: 66 year old man who presented to the [facility] ED 11/11 with a chief complaint of hematuria. PMHx significant for HTN, HLD, HFrEF/ICM EF 40-45%, CAD/NSTEMI, TIA, PAF (on Eliquis), T2DM, BPH (hx prostate CA).   Patient with questionable length of time of hematuria which may have been going on since 10/13 per patient.  Sent to the emergency department by SNF staff on 11/11.   At the emergency department he developed hypotension with a BP 83/63. WBC 14.2, LA 5.0 > 7.9. Code Sepsis was called. Blood cultures and urine cultures were obtained. Patient was given fluid bolus per sepsis guidelines.  Patient with complicated infectious history-started on meropenem and amoxicillin by TRH.  Urology was consulted for hematuria and concern for Fournier's.  Recommends monitoring with possible cystoscopy once Eliquis washout. TRH admitted patient.   11/11 Admit by TRH, BC>, UC>, PCCM consult, Mero and amoxicillin> 11/12 Started on peripheral Levophed for persistent hypotension, albumin 11/13 Troponin rising from 263 to 771, DVT/PE ruled out 11/14 dobutamine started for low coox, lower EF on echo 11/15 trial of restarting heparin 11/17 off dobutamine 11/19 coox 93, remains on pressors 11/20 Coox 67, CVP 8, on 3 norepi 11/21 Coox 75.1, 4 norepi  11/23 Norepi weaned to 57mg, hypotension into 80's overnight > increased levo. Robust UOP despite no lasix in 72 hours.   11/24 Levophed weaned off briefly yesterday and restarted overnight 11/26 Patient responsive to IVF resuscitation. Initial urine studies suggest hypovolemic hyponatremia 11/27 Ongoing mild hypotension, goal MAP 60. Remains on NE 247m. Midodrine increased, droxidopa added. Mild persistent hematuria with clots.    Assessment and Plan: Shock, mixed septic from UTI >cardiogenic shock  with new depressed EF>hypovolemic shock d/t overdiuresis Hypovolemic hyponatremia Clinically resolved infection and on appropriate antibiotics. Echo with EF 30% with concerns for cardiogenic shock however patient net negative volume. -Cardiology following intermittently while on any diuretics however still on droxidopa plus midodrine at this time - Continue hydrocortisone; decreased to '20mg'$  daily /'10mg'$  qhs as of 11/27 -Plan to ultimately taper midodrine, dose recently increased to '20mg'$  tid frrom '15mg'$  tid per PCCM on 11/27. On midodrine to '10mg'$  TID per cardiology -sbp noted to be in the 90's overnight, currently in the low-100's -Discussed with Critical Care who is familiar with patient's history. Pt historically has chronically low bp with sbp as low as 70-80's. Per Critical Care, recommendation to wean droxidopa to off.  -In the process of weaning droxidopa, anticipate stopping, possible w/in the next 24hrs   Acute on chronic HFrEF, ICM  CAD with stent in 05/2022 > 09/03/22 TTE> EF now 30-35, G2DD  pAF -Concerns for mixed cardiogenic shock picture - Cardiology consulted; not a candidate for home inotropes, revascularization, transplant or LVAD - Unable to tolerate GDMT due to persistent hypotension - Continue Plavix - Continue Eliquis -Cardiology had been following intermittently in case of being able to start any GDMT while on midodrine and droxidopa   Prostatitis Scrotal Cellulitis H/O ESBL E.Coli and CRE Proteus - ID consulted, appreciate recs -Last seen by ID on 09/05/2022.  Plan is for ertapenem 1 g daily for 4 weeks, end date 09/27/2022 - PICC in place  - Outpatient ID f/u, established in [place]   Hematuria due to UTI Known prostate cancer, under surveillance with urology - Trend H&H - Keep Foley until cleared by  Urology post-discharge - Continue Eliquis/Plavix, monitor closely in the setting of hematuria - Continue Flomax, Proscar   Hyponatremia, hypovolemic Mild AKI -  improving Hypocalcemia  - Trend BMP - Replete electrolytes as indicated - Monitor I&Os - Avoid nephrotoxic agents as able - Ensure adequate renal perfusion   DM2 -Continue Levemir - Continue SSI CBG monitoring -Also on prandial coverage   Deconditioning - PT/OT while in-house; recommended for SNF - Utilize prosthesis as able - TOC consulted; patient does NOT want to return to prior SNF [facility])     Chronic Stage 2 sacral ulcer, POA - WOC   Insomnia, phantom limb pain LLE - Continue reduced-dose gabapentin - Continue melatonin, trazodone, Ambien QHS PRN - Ortho tech consult for LLE sleeve   Chronic Critical Illness  - PMT consulted, appreciate recommendations - Patient acknowledges medical measures have not changed QOL - Continues to desire current scope of care - DNR code status      Subjective: Without complaints  Physical Exam: Vitals:   09/22/22 2222 09/23/22 0439 09/23/22 0452 09/23/22 0930  BP: (!) 86/67 103/66    Pulse: 60 62    Resp: 20 16    Temp: 98.3 F (36.8 C) 98.7 F (37.1 C)    TempSrc: Oral Oral    SpO2: 99% 96%  96%  Weight:   120.6 kg   Height:       General exam: Conversant, in no acute distress Respiratory system: normal chest rise, clear, no audible wheezing Cardiovascular system: regular rhythm, s1-s2 Gastrointestinal system: Nondistended, nontender, pos BS Central nervous system: No seizures, no tremors Extremities: No cyanosis, no joint deformities Skin: No rashes, no pallor Psychiatry: Affect normal // no auditory hallucinations   Data Reviewed:  Labs reviewed: Na 135, K 3.8, Cr 0.95, WBC 6.5, Hgb 7.4   Family Communication: Pt in room, family not at bedside  Disposition: Status is: Inpatient Remains inpatient appropriate because: Severity of illness  Planned Discharge Destination: Skilled nursing facility    Author: Marylu Lund, MD 09/23/2022 3:01 PM  For on call review [email or web address].

## 2022-09-23 NOTE — TOC Progression Note (Addendum)
Transition of Care [facility]) - Progression Note    Patient Details  Name: Peter Yang MRN: [id number] Date of Birth: [birth year]  Transition of Care [facility]) CM/SW Contact  Cyndal Kasson, Juliann Pulse, RN Phone Number: 09/23/2022, 1:33 PM  Clinical Narrative: Per patient permission left vm w/[name])friend) for bed choice-awaiting bed choice.  -1:41p-spoke to [name] she will asst patient w/choosing a facility & get back to me.     Expected Discharge Plan: [place] Barriers to Discharge: Continued Medical Work up  Expected Discharge Plan and Services Expected Discharge Plan: [place]   Discharge Planning Services: CM Consult   Living arrangements for the past 2 months: Single Family Home                                       Social Determinants of Health (SDOH) Interventions Transportation Interventions: Inpatient TOC Utilities Interventions: Inpatient TOC  Readmission Risk Interventions    09/02/2022   10:36 AM  Readmission Risk Prevention Plan  Transportation Screening Complete  Medication Review (RN Care Manager) Complete  PCP or Specialist appointment within 3-5 days of discharge Complete  HRI or Home Care Consult Complete  SW Recovery Care/Counseling Consult Complete  Palliative Care Screening Not [place] Complete

## 2022-09-23 NOTE — Progress Notes (Signed)
   09/23/22 1626  PT Visit Information  Last PT Received On 09/23/22  Reason Eval/Treat Not Completed Other (comment) (pt refused reporting headahe and stomach ache, RN notified. PT had extensive conversation with pt about participation with therapy and functional mobility goals. Made pt aware of limited mobility since admission and required +2 assist.)   Also discussed PLOF with use of prosthetic which pt has not been able to donn recently due to increased edema. Discussed that PT can provide additional ways to perform transfers/mobility without use of prosthesis to promote independence. PT asking pt how we can increase participation, pt requesting trial mid morning- pt strongly encouraged to participate as pt lives alone and has limited help at home and unable to perform mobility with prosthesis which was heavily relied on prior to admission at this time.

## 2022-09-23 NOTE — Progress Notes (Signed)
Rounding Note    Patient Name: Peter Yang Date of Encounter: 09/23/2022  [place] Cardiologist: Pixie Casino, MD   Subjective   Denies any chest pain or SOB  Inpatient Medications    Scheduled Meds:  acetaminophen  1,000 mg Oral Q8H   apixaban  5 mg Oral BID   atorvastatin  80 mg Oral Daily   Chlorhexidine Gluconate Cloth  6 each Topical Daily   clopidogrel  75 mg Oral Daily   droxidopa  100 mg Oral Q breakfast   finasteride  5 mg Oral Daily   gabapentin  300 mg Oral q1800   gabapentin  300 mg Oral Daily   hydrocortisone  20 mg Oral Daily   And   hydrocortisone  10 mg Oral Daily   insulin aspart  0-5 Units Subcutaneous QHS   insulin aspart  0-9 Units Subcutaneous TID WC   lidocaine  1 patch Transdermal Daily   melatonin  5 mg Oral q1800   midodrine  10 mg Oral TID with meals   nystatin   Topical BID   pantoprazole  40 mg Oral Daily   sodium chloride flush  10-40 mL Intracatheter Q12H   tamsulosin  0.4 mg Oral Daily   traZODone  50 mg Oral q1800   zolpidem  5 mg Oral q1800   Continuous Infusions:  sodium chloride 250 mL (09/14/22 0248)   sodium chloride 10 mL/hr at 09/11/22 0431   sodium chloride     ertapenem 1,000 mg (09/22/22 1630)   PRN Meds: sodium chloride, Place/Maintain arterial line **AND** sodium chloride, acetaminophen, diclofenac Sodium, mouth rinse, oxyCODONE, polyvinyl alcohol, sodium chloride flush   Vital Signs    Vitals:   09/22/22 1457 09/22/22 2222 09/23/22 0439 09/23/22 0452  BP: 108/65 (!) 86/67 103/66   Pulse: 67 60 62   Resp: '20 20 16   '$ Temp:  98.3 F (36.8 C) 98.7 F (37.1 C)   TempSrc:  Oral Oral   SpO2: 99% 99% 96%   Weight:    120.6 kg  Height:        Intake/Output Summary (Last 24 hours) at 09/23/2022 1123 Last data filed at 09/23/2022 7062 Gross per 24 hour  Intake 290 ml  Output 3050 ml  Net -2760 ml       09/23/2022    4:52 AM 09/21/2022    5:00 AM 09/20/2022    5:00 AM  Last 3 Weights   Weight (lbs) 265 lb 14.4 oz 267 lb 4.9 oz 267 lb 10.2 oz  Weight (kg) 120.611 kg 121.25 kg 121.4 kg      Telemetry  NSR . - Personally Reviewed  ECG    No new  - Personally Reviewed  Physical Exam   GEN: Well nourished, well developed in no acute distress HEENT: Normal NECK: No JVD; No carotid bruits LYMPHATICS: No lymphadenopathy CARDIAC:RRR, no murmurs, rubs, gallops RESPIRATORY:  Clear to auscultation without rales, wheezing or rhonchi  ABDOMEN: Soft, non-tender, non-distended MUSCULOSKELETAL:  No edema; No deformity  SKIN: Warm and dry NEUROLOGIC:  Alert and oriented x 3 PSYCHIATRIC:  Normal affect  Labs    High Sensitivity Troponin:   Recent Labs  Lab 09/01/22 0040 09/02/22 1935  TROPONINIHS 263* 771*      Chemistry Recent Labs  Lab 09/21/22 0328 09/22/22 0034 09/23/22 0208  NA 136 137 135  K 4.0 4.1 3.8  CL 110 111 109  CO2 22 21* 21*  GLUCOSE 195* 200* 200*  BUN  37* 34* 34*  CREATININE 1.00 0.89 0.95  CALCIUM 7.7* 7.8* 7.7*  PROT 4.7* 4.9* 4.6*  ALBUMIN 2.3* 2.4* 2.2*  AST 15 16 14*  ALT '25 23 18  '$ ALKPHOS 72 78 71  BILITOT 0.4 0.5 0.3  GFRNONAA >60 >60 >60  ANIONGAP 4* 5 5     Lipids No results for input(s): "CHOL", "TRIG", "HDL", "LABVLDL", "LDLCALC", "CHOLHDL" in the last 168 hours.  Hematology Recent Labs  Lab 09/21/22 0328 09/22/22 0034 09/23/22 0208  WBC 6.0 6.6 6.5  RBC 3.05* 3.16* 3.18*  HGB 7.4* 7.5* 7.4*  HCT 24.7* 25.7* 25.5*  MCV 81.0 81.3 80.2  MCH 24.3* 23.7* 23.3*  MCHC 30.0 29.2* 29.0*  RDW 17.2* 17.3* 17.5*  PLT 159 149* 125*    Thyroid  No results for input(s): "TSH", "FREET4" in the last 168 hours.   BNPNo results for input(s): "BNP", "PROBNP" in the last 168 hours.  DDimer No results for input(s): "DDIMER" in the last 168 hours.   Radiology    No results found.  Cardiac Studies   Left Cardiac Catheterization 06/17/2022: Conclusions: Severe multivessel coronary artery disease with severe calcification,  as detailed below, including 50% mid LAD stenosis, 95% stenosis of dominant branch of large D1 and chronic total occlusion of lateral branch of D1, chronic total occlusion of LCx, and chronic total occlusion of ostial through mid RCA (including stent placed in 01/2022) with left-to-right collaterals. Normal left ventricular filling pressure (LVEDP 10 mmHg).   Recommendations: Consider viability study with particular attention to the inferior wall.  If RCA territory is viable, cardiac surgery consultation for CABG should be considered.  If RCA territory is not viable (or the patient is not a candidate for CABG), medical therapy versus PCI to dominant branch of D1 (particularly if the patient reports recurrent angina) should be considered. Hold ticagrelor pending further workup and possible cardiac surgery consultation.  If the patient does not undergo CABG, I would favor 12 months of clopidogrel + DOAC therapy in the setting of new atrial fibrillation and NSTEMI. Aggressive secondary prevention of coronary artery disease.  Diagnostic Dominance: Right   _______________  Echocardiogram 09/03/2022: Impressions: 1. Left ventricular ejection fraction, by estimation, is 30 to 35%. The  left ventricle has moderately decreased function. The left ventricle  demonstrates regional wall motion abnormalities (see scoring  diagram/findings for description). The left  ventricular internal cavity size was mildly dilated. Left ventricular  diastolic parameters are consistent with Grade II diastolic dysfunction  (pseudonormalization).   2. Right ventricular systolic function is moderately reduced. The right  ventricular size is mildly enlarged. There is mildly elevated pulmonary  artery systolic pressure. The estimated right ventricular systolic  pressure is 06.2 mmHg.   3. Left atrial size was moderately dilated.   4. Right atrial size was mildly dilated.   5. The mitral valve is abnormal. Moderate mitral  valve regurgitation. No  evidence of mitral stenosis.   6. The aortic valve is tricuspid. There is mild calcification of the  aortic valve. Aortic valve regurgitation is not visualized. Aortic valve  sclerosis is present, with no evidence of aortic valve stenosis.   7. Aortic dilatation noted. There is mild dilatation of the aortic root,  measuring 42 mm.   8. The inferior vena cava is normal in size with <50% respiratory  variability, suggesting right atrial pressure of 8 mmHg.   Comparison(s): Changes from prior study are noted. EF significantly  reduced compared to prior, now with more prominent  anterior wall motion  abnormalities. Findings communicated to PCCM team.   Conclusion(s)/Recommendation(s): No left ventricular mural or apical  thrombus/thrombi.    Patient Profile     66 y.o. male with a history of CAD prior PTCA and PCI of RCA in 01/2022 and recent NSTEMI in 05/2022 where he was noted to have severe multivessel disease with CTO of RCA, LCX, and branch of D1 (not felt to be a candidate for CABG or PCI and treated medically). 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 He has had multiple admission over the last 3 months for multiple medical issues including acute metabolic encephalopathy/ altered mental status, hypoglycemia, hematuria, complicated UTI, and urinary retention. He was readmitted on 08/31/2022 for hematuria and AKI (felt to be pre-renal/ATN vs postrenal/obstructive in setting of ongoing hematuria with clots requiring Foley placement and irrigation. He was also felt to have septic shock secondary to UTI requiring multiple pressors. Hospital course has been complicated by pressure ulcer of  sacrum, cellulitis of scrotum, hyperkalemia requiring Lokelma, metabolic acidosis, and hyponatremia. He was initially started on IV fluids but then felt to be volume overloaded and was treated with IV Lasix. Cardiology consulted for further evaluation of acute on chronic CHF.  Assessment & Plan    HFrEF     Ischemic cardiomyopathy   Biventricular dysfunction  (see echo above) - Patient is not felt to be candidate for home inotropes or advanced heart failure therapies. NO plans to pursue invasive cardiac work-up. -Treatment so far has been limited by hypotension>>Off of all meds       -given Lasix '40mg'$  IV once yesterday for mild volume overload -He does not appear volume overloaded on exam today -no further Lasix at this time       Type 2 NSTEMI Multiple Vessel CAD History of prior PCI to RCA in 01/2022 at an outside facility. Admitted to [facility] in 05/2022 for NSTEMI and found to have severe multivessel CAD with CTO of ostial to mid RCA, CTO of LCX, and CTO  -High-sensitivity troponin 263 >> 771 this admission likely due to demand ischemia from septic shock. - No ischemic symptoms. - continue Plavix '75mg'$  daily  - no ASA due to DOAC - no BB due to hypotension - Continue high-intensity statin.  Hypotension -Pt was on midodrine 20 tid and droxidopa and solucortef       -Droxidopa being weaned   -Midodrine decreased to '10mg'$  TID yesterday and BP stable  Paroxysmal Atrial Fibrillation -remains in NSR -no BB due to hypotension -continue Eliquis '5mg'$  BID  I have spent a total of 35 minutes with patient reviewing 2D echo , telemetry, EKGs, labs and examining patient as well as establishing an assessment and plan that was discussed with the patient.  > 50% of time was spent in direct patient care.    For questions or updates, please contact [place] Please consult [email or web address] for contact info under        Signed, Fransico Him, MD  09/23/2022, 11:23 AM

## 2022-09-24 DIAGNOSIS — I951 Orthostatic hypotension: Secondary | ICD-10-CM

## 2022-09-24 DIAGNOSIS — R310 Gross hematuria: Secondary | ICD-10-CM | POA: Diagnosis not present

## 2022-09-24 DIAGNOSIS — N179 Acute kidney failure, unspecified: Secondary | ICD-10-CM | POA: Diagnosis not present

## 2022-09-24 DIAGNOSIS — I2510 Atherosclerotic heart disease of native coronary artery without angina pectoris: Secondary | ICD-10-CM | POA: Diagnosis not present

## 2022-09-24 DIAGNOSIS — I480 Paroxysmal atrial fibrillation: Secondary | ICD-10-CM | POA: Diagnosis not present

## 2022-09-24 DIAGNOSIS — N1330 Unspecified hydronephrosis: Secondary | ICD-10-CM | POA: Diagnosis not present

## 2022-09-24 DIAGNOSIS — I255 Ischemic cardiomyopathy: Secondary | ICD-10-CM | POA: Diagnosis not present

## 2022-09-24 LAB — COMPREHENSIVE METABOLIC PANEL
ALT: 15 U/L (ref 0–44)
AST: 13 U/L — ABNORMAL LOW (ref 15–41)
Albumin: 2.4 g/dL — ABNORMAL LOW (ref 3.5–5.0)
Alkaline Phosphatase: 73 U/L (ref 38–126)
Anion gap: 5 (ref 5–15)
BUN: 32 mg/dL — ABNORMAL HIGH (ref 8–23)
CO2: 22 mmol/L (ref 22–32)
Calcium: 7.8 mg/dL — ABNORMAL LOW (ref 8.9–10.3)
Chloride: 110 mmol/L (ref 98–111)
Creatinine, Ser: 0.74 mg/dL (ref 0.61–1.24)
GFR, Estimated: >60 mL/min (ref >60)
Glucose, Bld: 193 mg/dL — ABNORMAL HIGH (ref 70–99)
Potassium: 3.7 mmol/L (ref 3.5–5.1)
Sodium: 137 mmol/L (ref 135–145)
Total Bilirubin: 0.5 mg/dL (ref 0.3–1.2)
Total Protein: 4.8 g/dL — ABNORMAL LOW (ref 6.5–8.1)

## 2022-09-24 LAB — CBC
HCT: 25.3 % — ABNORMAL LOW (ref 39.0–52.0)
Hemoglobin: 7.5 g/dL — ABNORMAL LOW (ref 13.0–17.0)
MCH: 23.7 pg — ABNORMAL LOW (ref 26.0–34.0)
MCHC: 29.6 g/dL — ABNORMAL LOW (ref 30.0–36.0)
MCV: 79.8 fL — ABNORMAL LOW (ref 80.0–100.0)
Platelets: 114 10*3/uL — ABNORMAL LOW (ref 150–400)
RBC: 3.17 MIL/uL — ABNORMAL LOW (ref 4.22–5.81)
RDW: 17.4 % — ABNORMAL HIGH (ref 11.5–15.5)
WBC: 6 10*3/uL (ref 4.0–10.5)
nRBC: 0 % (ref 0.0–0.2)

## 2022-09-24 LAB — GLUCOSE, CAPILLARY
Glucose-Capillary: 126 mg/dL — ABNORMAL HIGH (ref 70–99)
Glucose-Capillary: 146 mg/dL — ABNORMAL HIGH (ref 70–99)
Glucose-Capillary: 160 mg/dL — ABNORMAL HIGH (ref 70–99)
Glucose-Capillary: 161 mg/dL — ABNORMAL HIGH (ref 70–99)

## 2022-09-24 NOTE — Progress Notes (Signed)
Progress Note   Patient: Peter Yang ZOX:[id number] DOB: 10-31-1955 DOA: 08/31/2022     23 DOS: the patient was seen and examined on 09/24/2022   Brief hospital course: 66 year old man who [facility]/11 with a chief complaint of hematuria. PMHx significant for HTN, HLD, HFrEF/ICM EF 40-45%, CAD/NSTEMI, TIA, PAF (on Eliquis), T2DM, BPH (hx prostate CA).   Patient with questionable length of time of hematuria which may have been going on since 10/13 per patient.  Sent to the emergency department by SNF staff on 11/11.   At the emergency department he developed hypotension with a BP 83/63. WBC 14.2, LA 5.0 > 7.9. Code Sepsis was called. Blood cultures and urine cultures were obtained. Patient was given fluid bolus per sepsis guidelines.  Patient with complicated infectious history-started on meropenem and amoxicillin by [facility].  Urology was consulted for hematuria and concern for Fournier's.  Recommends monitoring with possible cystoscopy once Eliquis washout. [facility] admitted patient.   11/11 Admit by [facility], BC>, UC>, PCCM consult, Mero and amoxicillin> 11/12 Started on peripheral Levophed for persistent hypotension, albumin 11/13 Troponin rising from 263 to 771, DVT/PE ruled out 11/14 dobutamine started for low coox, lower EF on echo 11/15 trial of restarting heparin 11/17 off dobutamine 11/19 coox 93, remains on pressors 11/20 Coox 67, CVP 8, on 3 norepi 11/21 Coox 75.1, 4 norepi  11/23 Norepi weaned to 74mg, hypotension into 80's overnight > increased levo. Robust UOP despite no lasix in 72 hours.   11/24 Levophed weaned off briefly yesterday and restarted overnight 11/26 Patient responsive to IVF resuscitation. Initial urine studies suggest hypovolemic hyponatremia 11/27 Ongoing mild hypotension, goal MAP 60. Remains on NE 274m. Midodrine increased, droxidopa added. Mild persistent hematuria with clots.    Assessment and Plan: Shock, mixed septic from UTI >cardiogenic shock  with new depressed EF>hypovolemic shock d/t overdiuresis Hypovolemic hyponatremia Clinically resolved infection and on appropriate antibiotics. Echo with EF 30% with concerns for cardiogenic shock however patient net negative volume. -Cardiology following intermittently while on any diuretics however still on droxidopa plus midodrine at this time - Continue hydrocortisone; decreased to '20mg'$  daily /'10mg'$  qhs as of 11/27 -Plan to ultimately taper midodrine, dose recently increased to '20mg'$  tid frrom '15mg'$  tid per PCCM on 11/27. Now on midodrine to '10mg'$  TID per cardiology -sbp noted to be in the 90's overnight, currently in the low-100's -Discussed with Critical Care who is familiar with patient's history. Pt historically has chronically low bp with sbp as low as 70-80's. Per Critical Care, recommendation to wean droxidopa to off.  -In the process of weaning droxidopa, sbp in the mid-90's currently. Pt stable. Will hold further droxidopa   Acute on chronic HFrEF, ICM  CAD with stent in 05/2022 > 09/03/22 TTE> EF now 30-35, G2DD  pAF -Concerns for mixed cardiogenic shock picture - Cardiology consulted; not a candidate for home inotropes, revascularization, transplant or LVAD - Unable to tolerate GDMT due to persistent hypotension - Continue Plavix - Continue Eliquis -Cardiology had been following intermittently in case of being able to start any GDMT while on midodrine and droxidopa -Cardiology has since signed off   Prostatitis Scrotal Cellulitis H/O ESBL E.Coli and CRE Proteus - ID consulted, appreciate recs -Last seen by ID on 09/05/2022.  Plan is for ertapenem 1 g daily for 4 weeks, end date 09/27/2022 - PICC in place  - Outpatient ID f/u, established in [place]   Hematuria due to UTI Known prostate cancer, under surveillance with  urology - Trend H&H - Keep Foley until cleared by Urology post-discharge - Continue Eliquis/Plavix, monitor closely in the setting of hematuria - Continue  Flomax, Proscar   Hyponatremia, hypovolemic Mild AKI - improving Hypocalcemia  - Trend BMP - Replete electrolytes as indicated - Monitor I&Os - Avoid nephrotoxic agents as able - Ensure adequate renal perfusion   DM2 -Continue Levemir - Continue SSI CBG monitoring -Also on prandial coverage   Deconditioning - PT/OT while in-house; recommended for SNF - Utilize prosthesis as able - patient does NOT want to return to prior SNF [facility]). TOC following   Chronic Stage 2 sacral ulcer, POA - WOC   Insomnia, phantom limb pain LLE - Continue reduced-dose gabapentin - Continue melatonin, trazodone, Ambien QHS PRN - Ortho tech consult for LLE sleeve   Chronic Critical Illness  - PMT consulted, appreciate recommendations - Patient acknowledges medical measures have not changed QOL - Continues to desire current scope of care - DNR code status      Subjective: No complaints this AM  Physical Exam: Vitals:   09/23/22 2328 09/24/22 0613 09/24/22 0831 09/24/22 1259  BP:  (!) '87/56 97/62 93/67 '$  Pulse:  61  65  Resp: '11 17 14 18  '$ Temp:  98 F (36.7 C)  98.2 F (36.8 C)  TempSrc:  Oral  Oral  SpO2:  98% 98% 98%  Weight:      Height:       General exam: Awake, laying in bed, in nad Respiratory system: Normal respiratory effort, no wheezing Cardiovascular system: regular rate, s1, s2 Gastrointestinal system: Soft, nondistended, positive BS Central nervous system: CN2-12 grossly intact, strength intact Extremities: Perfused, no clubbing Skin: Normal skin turgor, no notable skin lesions seen Psychiatry: flat affect // no visual hallucinations   Data Reviewed:  Labs reviewed: Na 137, K 3.7, Cr 0.74, WBC 6.0, Hgb 7.5   Family Communication: Pt in room, family not at bedside  Disposition: Status is: Inpatient Remains inpatient appropriate because: Severity of illness  Planned Discharge Destination: Skilled nursing facility    Author: Marylu Lund, MD 09/24/2022  2:58 PM  For on call review [email or web address].

## 2022-09-24 NOTE — Progress Notes (Signed)
PT Cancellation Note  Patient Details Name: Peter Yang MRN: [id number] DOB: Oct 28, 1955   Cancelled Treatment:    Reason Eval/Treat Not Completed: Patient declined, no reason specified  Pt requested mid-morning today and again declines to participate.  Pt states he is going to be stubborn.  Pt notified that this is his 3rd refusal to participate and that he may not be able to participate in rehab with cancellations.  Pt not bothered by this and again declines to participate despite this possibly effecting his d/c plan.  Since pt is aware of cancellations and still refuses to participate.  PT to sign off at this time.   Kati L Payson 09/24/2022, 10:05 AM Arlyce Dice, DPT Physical Therapist Acute Rehabilitation Services Preferred contact method: Secure Chat Weekend Pager Only: [phone] Office: [phone]

## 2022-09-24 NOTE — TOC Progression Note (Signed)
Transition of Care [facility]) - Progression Note    Patient Details  Name: Arnet Hofferber MRN: [id number] Date of Birth: [birth year]  [facility] CM/SW Contact  Koralee Wedeking, Juliann Pulse, RN Phone Number: 09/24/2022, 1:45 PM  Clinical Narrative:  Noted PT note on patient's participation. Left vm wBetty(friend) per patient permission to contact-await choice on Bed offers.     Expected Discharge Plan: [place] Barriers to Discharge: Continued Medical Work up  Expected Discharge Plan and Services Expected Discharge Plan: [place]   Discharge Planning Services: CM Consult   Living arrangements for the past 2 months: Single Family Home                                       Social Determinants of Health (SDOH) Interventions Transportation Interventions: Inpatient TOC Utilities Interventions: Inpatient TOC  Readmission Risk Interventions    09/02/2022   10:36 AM  Readmission Risk Prevention Plan  Transportation Screening Complete  Medication Review (RN Care Manager) Complete  PCP or Specialist appointment within 3-5 days of discharge Complete  HRI or Home Care Consult Complete  SW Recovery Care/Counseling Consult Complete  Palliative Care Screening Not [place] Complete

## 2022-09-24 NOTE — Progress Notes (Addendum)
Rounding Note    Patient Name: Kjell Brannen Date of Encounter: 09/24/2022  [place] Cardiologist: Pixie Casino, MD   Subjective   No shortness of breath or chest pain since I saw him yesterday  Inpatient Medications    Scheduled Meds:  acetaminophen  1,000 mg Oral Q8H   apixaban  5 mg Oral BID   atorvastatin  80 mg Oral Daily   Chlorhexidine Gluconate Cloth  6 each Topical Daily   clopidogrel  75 mg Oral Daily   droxidopa  100 mg Oral Q breakfast   finasteride  5 mg Oral Daily   gabapentin  300 mg Oral q1800   gabapentin  300 mg Oral Daily   hydrocortisone  20 mg Oral Daily   And   hydrocortisone  10 mg Oral Daily   insulin aspart  0-5 Units Subcutaneous QHS   insulin aspart  0-9 Units Subcutaneous TID WC   lidocaine  1 patch Transdermal Daily   melatonin  5 mg Oral q1800   midodrine  10 mg Oral TID with meals   nystatin   Topical BID   pantoprazole  40 mg Oral Daily   sodium chloride flush  10-40 mL Intracatheter Q12H   tamsulosin  0.4 mg Oral Daily   traZODone  50 mg Oral q1800   zolpidem  5 mg Oral q1800   Continuous Infusions:  sodium chloride 250 mL (09/14/22 0248)   sodium chloride 10 mL/hr at 09/23/22 1553   sodium chloride     ertapenem Stopped (09/23/22 1628)   PRN Meds: sodium chloride, Place/Maintain arterial line **AND** sodium chloride, acetaminophen, diclofenac Sodium, mouth rinse, oxyCODONE, polyvinyl alcohol, sodium chloride flush   Vital Signs    Vitals:   09/23/22 2237 09/23/22 2328 09/24/22 0613 09/24/22 0831  BP: 98/69  (!) 87/56 97/62  Pulse: (!) 58  61   Resp: '10 11 17 14  '$ Temp: 98.4 F (36.9 C)  98 F (36.7 C)   TempSrc: Oral  Oral   SpO2: 97%  98% 98%  Weight:      Height:        Intake/Output Summary (Last 24 hours) at 09/24/2022 0946 Last data filed at 09/24/2022 0836 Gross per 24 hour  Intake 360 ml  Output 1675 ml  Net -1315 ml       09/23/2022    4:52 AM 09/21/2022    5:00 AM 09/20/2022    5:00  AM  Last 3 Weights  Weight (lbs) 265 lb 14.4 oz 267 lb 4.9 oz 267 lb 10.2 oz  Weight (kg) 120.611 kg 121.25 kg 121.4 kg      Telemetry  Normal sinus rhythm- Personally Reviewed  ECG    No new  - Personally Reviewed  Physical Exam   tGEN: Well nourished, well developed in no acute distress HEENT: Normal NECK: No JVD; No carotid bruits LYMPHATICS: No lymphadenopathy CARDIAC:RRR, no murmurs, rubs, gallops RESPIRATORY:  Clear to auscultation without rales, wheezing or rhonchi  ABDOMEN: Soft, non-tender, non-distended MUSCULOSKELETAL:  trace RLE edema; No deformity  SKIN: Warm and dry NEUROLOGIC:  Alert and oriented x 3 PSYCHIATRIC:  Normal affect  Labs    High Sensitivity Troponin:   Recent Labs  Lab 09/01/22 0040 09/02/22 1935  TROPONINIHS 263* 771*      Chemistry Recent Labs  Lab 09/22/22 0034 09/23/22 0208 09/24/22 0240  NA 137 135 137  K 4.1 3.8 3.7  CL 111 109 110  CO2 21* 21* 22  GLUCOSE 200* 200* 193*  BUN 34* 34* 32*  CREATININE 0.89 0.95 0.74  CALCIUM 7.8* 7.7* 7.8*  PROT 4.9* 4.6* 4.8*  ALBUMIN 2.4* 2.2* 2.4*  AST 16 14* 13*  ALT '23 18 15  '$ ALKPHOS 78 71 73  BILITOT 0.5 0.3 0.5  GFRNONAA >60 >60 >60  ANIONGAP '5 5 5     '$ Lipids No results for input(s): "CHOL", "TRIG", "HDL", "LABVLDL", "LDLCALC", "CHOLHDL" in the last 168 hours.  Hematology Recent Labs  Lab 09/22/22 0034 09/23/22 0208 09/24/22 0240  WBC 6.6 6.5 6.0  RBC 3.16* 3.18* 3.17*  HGB 7.5* 7.4* 7.5*  HCT 25.7* 25.5* 25.3*  MCV 81.3 80.2 79.8*  MCH 23.7* 23.3* 23.7*  MCHC 29.2* 29.0* 29.6*  RDW 17.3* 17.5* 17.4*  PLT 149* 125* 114*    Thyroid  No results for input(s): "TSH", "FREET4" in the last 168 hours.   BNPNo results for input(s): "BNP", "PROBNP" in the last 168 hours.  DDimer No results for input(s): "DDIMER" in the last 168 hours.   Radiology    No results found.  Cardiac Studies   Left Cardiac Catheterization 06/17/2022: Conclusions: Severe multivessel  coronary artery disease with severe calcification, as detailed below, including 50% mid LAD stenosis, 95% stenosis of dominant branch of large D1 and chronic total occlusion of lateral branch of D1, chronic total occlusion of LCx, and chronic total occlusion of ostial through mid RCA (including stent placed in 01/2022) with left-to-right collaterals. Normal left ventricular filling pressure (LVEDP 10 mmHg).   Recommendations: Consider viability study with particular attention to the inferior wall.  If RCA territory is viable, cardiac surgery consultation for CABG should be considered.  If RCA territory is not viable (or the patient is not a candidate for CABG), medical therapy versus PCI to dominant branch of D1 (particularly if the patient reports recurrent angina) should be considered. Hold ticagrelor pending further workup and possible cardiac surgery consultation.  If the patient does not undergo CABG, I would favor 12 months of clopidogrel + DOAC therapy in the setting of new atrial fibrillation and NSTEMI. Aggressive secondary prevention of coronary artery disease.  Diagnostic Dominance: Right   _______________  Echocardiogram 09/03/2022: Impressions: 1. Left ventricular ejection fraction, by estimation, is 30 to 35%. The  left ventricle has moderately decreased function. The left ventricle  demonstrates regional wall motion abnormalities (see scoring  diagram/findings for description). The left  ventricular internal cavity size was mildly dilated. Left ventricular  diastolic parameters are consistent with Grade II diastolic dysfunction  (pseudonormalization).   2. Right ventricular systolic function is moderately reduced. The right  ventricular size is mildly enlarged. There is mildly elevated pulmonary  artery systolic pressure. The estimated right ventricular systolic  pressure is 36.1 mmHg.   3. Left atrial size was moderately dilated.   4. Right atrial size was mildly dilated.    5. The mitral valve is abnormal. Moderate mitral valve regurgitation. No  evidence of mitral stenosis.   6. The aortic valve is tricuspid. There is mild calcification of the  aortic valve. Aortic valve regurgitation is not visualized. Aortic valve  sclerosis is present, with no evidence of aortic valve stenosis.   7. Aortic dilatation noted. There is mild dilatation of the aortic root,  measuring 42 mm.   8. The inferior vena cava is normal in size with <50% respiratory  variability, suggesting right atrial pressure of 8 mmHg.   Comparison(s): Changes from prior study are noted. EF significantly  reduced compared  to prior, now with more prominent anterior wall motion  abnormalities. Findings communicated to PCCM team.   Conclusion(s)/Recommendation(s): No left ventricular mural or apical  thrombus/thrombi.    Patient Profile     66 y.o. male with a history of CAD prior PTCA and PCI of RCA in 01/2022 and recent NSTEMI in 05/2022 where he was noted to have severe multivessel disease with CTO of RCA, LCX, and branch of D1 (not felt to be a candidate for CABG or PCI and treated medically). 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 He has had multiple admission over the last 3 months for multiple medical issues including acute metabolic encephalopathy/ altered mental status, hypoglycemia, hematuria, complicated UTI, and urinary retention. He was readmitted on 08/31/2022 for hematuria and AKI (felt to be pre-renal/ATN vs postrenal/obstructive in setting of ongoing hematuria with clots requiring Foley placement and irrigation. He was also felt to have septic shock secondary to UTI requiring multiple pressors. Hospital  course has been complicated by pressure ulcer of sacrum, cellulitis of scrotum, hyperkalemia requiring Lokelma, metabolic acidosis, and hyponatremia. He was initially started on IV fluids but then felt to be volume overloaded and was treated with IV Lasix. Cardiology consulted for further evaluation of acute on chronic CHF.  Assessment & Plan    HFrEF     Ischemic cardiomyopathy   Biventricular dysfunction  (see echo above) - Patient is not felt to be candidate for home inotropes or advanced heart failure therapies. NO plans to pursue invasive cardiac work-up. -Treatment so far has been limited by hypotension>>Off of all meds       -He appears euvolemic on exam today -no further Lasix at this time       Type 2 NSTEMI Multiple Vessel CAD History of prior PCI to RCA in 01/2022 at an outside facility. Admitted to [facility] in 05/2022 for NSTEMI and found to have severe multivessel CAD with CTO of ostial to mid RCA, CTO of LCX, and CTO  -High-sensitivity troponin 263 >> 771 this admission likely due to demand ischemia from septic shock. - No ischemic symptoms>>no plans for further ischemic workup >> outside Cardiology records stated that further cath would not be pursued unless patient had anginal sx on max medical therapy with antianginal meds - continue Plavix '75mg'$  daily  - no ASA due to DOAC - BP continues to be soft so cannot add beta-blocker - Continue Atorvastatin '80mg'$  daily  Hypotension -Pt was on midodrine 20 tid and droxidopa and solucortef       -Droxidopa is being weaned off -Continue midodrine 10 mg 3 times daily as BP is soft but stable and he normally runs a low blood pressure at home  Paroxysmal Atrial Fibrillation -Changes to maintain normal sinus rhythm with no A-fib -no BB due to hypotension -continue  Eliquis 5 mg twice daily  I have spent a total of 35 minutes with patient reviewing 2D echo, outside cardiology notes including cath , telemetry, EKGs, labs and examining patient  as well as establishing an assessment and plan that was discussed with the patient.  > 50% of time was spent in direct patient care.    [facility] will sign off.   Medication Recommendations:  Apixaban '5mg'$  BID, Atorvastatin '80mg'$  daily, Plavix '75mg'$  daily, Midodrine '10mg'$  TID  Other recommendations (labs, testing, etc):  none Follow up as an outpatient:  Followup with Cardiologist Dr. Blenda Bridegroom at [facility] in 2 weeks  For questions or updates, please contact [place] Please consult [email or web address] for contact info under        Signed, Fransico Him, MD  09/24/2022, 9:46 AM

## 2022-09-25 DIAGNOSIS — N179 Acute kidney failure, unspecified: Secondary | ICD-10-CM | POA: Diagnosis not present

## 2022-09-25 DIAGNOSIS — I480 Paroxysmal atrial fibrillation: Secondary | ICD-10-CM | POA: Diagnosis not present

## 2022-09-25 DIAGNOSIS — N1330 Unspecified hydronephrosis: Secondary | ICD-10-CM

## 2022-09-25 DIAGNOSIS — R310 Gross hematuria: Secondary | ICD-10-CM | POA: Diagnosis not present

## 2022-09-25 LAB — CBC
HCT: 25 % — ABNORMAL LOW (ref 39.0–52.0)
Hemoglobin: 7.4 g/dL — ABNORMAL LOW (ref 13.0–17.0)
MCH: 23.5 pg — ABNORMAL LOW (ref 26.0–34.0)
MCHC: 29.6 g/dL — ABNORMAL LOW (ref 30.0–36.0)
MCV: 79.4 fL — ABNORMAL LOW (ref 80.0–100.0)
Platelets: 104 10*3/uL — ABNORMAL LOW (ref 150–400)
RBC: 3.15 MIL/uL — ABNORMAL LOW (ref 4.22–5.81)
RDW: 17.6 % — ABNORMAL HIGH (ref 11.5–15.5)
WBC: 5.6 10*3/uL (ref 4.0–10.5)
nRBC: 0 % (ref 0.0–0.2)

## 2022-09-25 LAB — COMPREHENSIVE METABOLIC PANEL
ALT: 17 U/L (ref 0–44)
AST: 13 U/L — ABNORMAL LOW (ref 15–41)
Albumin: 2.4 g/dL — ABNORMAL LOW (ref 3.5–5.0)
Alkaline Phosphatase: 71 U/L (ref 38–126)
Anion gap: 7 (ref 5–15)
BUN: 30 mg/dL — ABNORMAL HIGH (ref 8–23)
CO2: 21 mmol/L — ABNORMAL LOW (ref 22–32)
Calcium: 7.8 mg/dL — ABNORMAL LOW (ref 8.9–10.3)
Chloride: 110 mmol/L (ref 98–111)
Creatinine, Ser: 0.73 mg/dL (ref 0.61–1.24)
GFR, Estimated: >60 mL/min (ref >60)
Glucose, Bld: 157 mg/dL — ABNORMAL HIGH (ref 70–99)
Potassium: 3.7 mmol/L (ref 3.5–5.1)
Sodium: 138 mmol/L (ref 135–145)
Total Bilirubin: 0.5 mg/dL (ref 0.3–1.2)
Total Protein: 4.7 g/dL — ABNORMAL LOW (ref 6.5–8.1)

## 2022-09-25 LAB — GLUCOSE, CAPILLARY
Glucose-Capillary: 145 mg/dL — ABNORMAL HIGH (ref 70–99)
Glucose-Capillary: 148 mg/dL — ABNORMAL HIGH (ref 70–99)
Glucose-Capillary: 174 mg/dL — ABNORMAL HIGH (ref 70–99)
Glucose-Capillary: 163 mg/dL — ABNORMAL HIGH (ref 70–99)

## 2022-09-25 NOTE — Progress Notes (Signed)
PROGRESS NOTE    Peter Yang  SEG:[id number] DOB: 1956-08-03 DOA: 08/31/2022 PCP: Clovia Cuff, MD    Chief Complaint  Patient presents with   Hematuria    Brief Narrative:  66 year old man who presented to the [facility] ED 11/11 with a chief complaint of hematuria. PMHx significant for HTN, HLD, HFrEF/ICM EF 40-45%, CAD/NSTEMI, TIA, PAF (on Eliquis), T2DM, BPH (hx prostate CA).   Patient with questionable length of time of hematuria which may have been going on since 10/13 per patient.  Sent to the emergency department by SNF staff on 11/11.   At the emergency department he developed hypotension with a BP 83/63. WBC 14.2, LA 5.0 > 7.9. Code Sepsis was called. Blood cultures and urine cultures were obtained. Patient was given fluid bolus per sepsis guidelines.  Patient with complicated infectious history-started on meropenem and amoxicillin by [facility].  Urology was consulted for hematuria and concern for Fournier's.  Recommends monitoring with possible cystoscopy once Eliquis washout. [facility] admitted patient.   11/11 Admit by [facility], BC>, UC>, PCCM consult, Mero and amoxicillin> 11/12 Started on peripheral Levophed for persistent hypotension, albumin 11/13 Troponin rising from 263 to 771, DVT/PE ruled out 11/14 dobutamine started for low coox, lower EF on echo 11/15 trial of restarting heparin 11/17 off dobutamine 11/19 coox 93, remains on pressors 11/20 Coox 67, CVP 8, on 3 norepi 11/21 Coox 75.1, 4 norepi  11/23 Norepi weaned to 23mg, hypotension into 80's overnight > increased levo. Robust UOP despite no lasix in 72 hours.   11/24 Levophed weaned off briefly yesterday and restarted overnight 11/26 Patient responsive to IVF resuscitation. Initial urine studies suggest hypovolemic hyponatremia 11/27 Ongoing mild hypotension, goal MAP 60. Remains on NE 276m. Midodrine increased, droxidopa added. Mild persistent hematuria with clots.       Assessment & Plan:   Principal Problem:   Gross  hematuria Active Problems:   Acute kidney injury ([facility]  Coronary artery disease due to lipid rich plaque   UTI (urinary tract infection)   Borderline low blood pressure determined by examination   Lactic acidosis   Diabetic polyneuropathy associated with type 2 diabetes mellitus (HCC)   PAF (paroxysmal atrial fibrillation) (HCC)   Acute on chronic HFrEF (heart failure with reduced ejection fraction) (HCC)   Pressure ulcer of sacral region, stage 2 (HCC)   Dyslipidemia   Cellulitis of scrotum   Acute prostatitis   Septic shock (HCC)   Hyponatremia   Cardiogenic shock (HCC)   Physical deconditioning   Palliative care by specialist   Goals of care, counseling/discussion   AKI (acute kidney injury) ([place]  Mood altered   Medication management   Need for emotional support   Pain   High risk medication use   Counseling and coordination of care   Acute on chronic systolic congestive heart failure (HCC)   HFrEF (heart failure with reduced ejection fraction) (HCC)   Orthostatic hypotension  Shock, mixed septic from UTI >cardiogenic shock with new depressed EF>hypovolemic shock d/t overdiuresis Hypovolemic hyponatremia Clinically resolved infection and on appropriate antibiotics. Echo with EF 30% with concerns for cardiogenic shock however patient net negative volume. -Cardiology following intermittently while on any diuretics. -Droxidopa has been weaned off currently on home regimen midodrine.  - Continue hydrocortisone; decreased to '20mg'$  daily /'10mg'$  qhs as of 11/27 -Plan to ultimately taper midodrine, dose initially increased to '20mg'$  tid frrom '15mg'$  tid per PCCM on 11/27. Now on midodrine to '10mg'$  TID per cardiology -Systolic blood pressures currently  soft.  -Dr. Wyline Copas discussed with Critical Care who is familiar with patient's history. Pt historically has chronically low bp with sbp as low as 70-80's. Per Critical Care, recommendation to wean droxidopa to off, which is currently being  weaned off. -Continue empiric IV antibiotics. -Follow.   Acute on chronic HFrEF, ICM  CAD with stent in 05/2022 > 09/03/22 TTE> EF now 30-35, G2DD  pAF -Concerns for mixed cardiogenic shock during the hospitalization. - Cardiology consulted; not a candidate for home inotropes, revascularization, transplant or LVAD - Unable to tolerate GDMT due to persistent hypotension -Continue Eliquis, Plavix.   -Cardiology following intermittently and patient unable to be started on GDMT while on midodrine and droxidopa.   -Droxidopa has been weaned off.   -Cardiology has signed off.   -Outpatient follow-up with primary cardiologist.     Prostatitis Scrotal Cellulitis H/O ESBL E.Coli and CRE Proteus - ID consulted, appreciate recs -Last seen by ID on 09/05/2022.  Plan is for ertapenem 1 g daily for 4 weeks, end date 09/27/2022 - PICC in place  - Outpatient ID f/u, established in [place]   Hematuria due to UTI Known prostate cancer, under surveillance with urology - Trend H&H - Keep Foley until cleared by Urology post-discharge - Continue Eliquis/Plavix, monitor closely in the setting of hematuria - Continue Flomax, Proscar -Will need outpatient follow-up with urology likely will be discharged with Foley catheter in place until outpatient follow-up.   Hyponatremia, hypovolemic Mild AKI - improved Hypocalcemia  -Electrolytes improved, sodium at 138, potassium at 3.7.   -Creatinine down to 0.73.   -Avoid nephrotoxic agents.   DM2 -Hemoglobin A1c 6.6 (08/03/2022). -CBG 145 this morning.  -Continue SSI.     Deconditioning - PT/OT while in-house; recommended for SNF -Patient noted to be refusing PT therapies today. - Utilize prosthesis as able - patient does NOT want to return to prior SNF [facility]). TOC following and looking for SNF for patient.   Chronic Stage 2 sacral ulcer, POA - WOC   Insomnia, phantom limb pain LLE - Continue reduced-dose gabapentin - Continue melatonin,  trazodone, Ambien QHS PRN - Ortho tech consult for LLE sleeve which has been placed.   Chronic Critical Illness  - PMT consulted, appreciate recommendations - Patient acknowledges medical measures have not changed QOL - Continues to desire current scope of care - DNR code status       DVT prophylaxis: Eliquis Code Status: DNR Family Communication: Updated patient.  No family at bedside. Disposition: SNF  Status is: Inpatient Remains inpatient appropriate because: Severitiy of illness   Consultants:  Urology: Dr Gloriann Loan 08/31/2022 PCCM: Dr Patsey Berthold 09/01/2022 Wound care: Julien Girt, RN 09/02/2022 ID: Dr. Juleen China 09/03/2022 Palliaitve Care: Dr Rowe Pavy 09/03/2022 Cardiology: Dr Gasper Sells 09/04/2022  Procedures:  CT renal stone protocol 08/31/2022 CT angiogram chest 09/02/2022 Chest x-ray 09/02/2022 2D echo 09/03/2022 Bilateral lower extremity Dopplers 09/02/2022  Antimicrobials:  Anti-infectives (From admission, onward)    Start     Dose/Rate Route Frequency Ordered Stop   09/05/22 1600  ertapenem (INVANZ) 1,000 mg in sodium chloride 0.9 % 100 mL IVPB        1 g 200 mL/hr over 30 Minutes Intravenous Every 24 hours 09/05/22 1357 09/29/22 2359   09/03/22 0800  meropenem (MERREM) 1 g in sodium chloride 0.9 % 100 mL IVPB  Status:  Discontinued        1 g 200 mL/hr over 30 Minutes Intravenous Every 8 hours 09/03/22 0201 09/05/22 1357   09/01/22 1100  meropenem (MERREM) 1 g in sodium chloride 0.9 % 100 mL IVPB  Status:  Discontinued        1 g 200 mL/hr over 30 Minutes Intravenous Every 12 hours 09/01/22 0416 09/03/22 0201   09/01/22 1100  fluconazole (DIFLUCAN) IVPB 200 mg  Status:  Discontinued        200 mg 100 mL/hr over 60 Minutes Intravenous Every 24 hours 09/01/22 1020 09/04/22 1309   09/01/22 0600  meropenem (MERREM) 1 g in sodium chloride 0.9 % 100 mL IVPB  Status:  Discontinued        1 g 200 mL/hr over 30 Minutes Intravenous Every 8 hours 08/31/22 2136 09/01/22  0416   08/31/22 2200  sulfamethoxazole-trimethoprim (BACTRIM DS) 800-160 MG per tablet 1 tablet  Status:  Discontinued        1 tablet Oral 2 times daily 08/31/22 2011 08/31/22 2129   08/31/22 2200  amoxicillin (AMOXIL) capsule 500 mg  Status:  Discontinued        500 mg Oral Every 8 hours 08/31/22 2131 09/03/22 1417   08/31/22 2145  meropenem (MERREM) 1 g in sodium chloride 0.9 % 100 mL IVPB        1 g 200 mL/hr over 30 Minutes Intravenous  Once 08/31/22 2136 09/01/22 0820   08/31/22 1815  cefTRIAXone (ROCEPHIN) 1 g in sodium chloride 0.9 % 100 mL IVPB        1 g 200 mL/hr over 30 Minutes Intravenous  Once 08/31/22 1814 08/31/22 1911         Subjective: Sitting up in bed.  States Foley catheter pulls and uncomfortable when he turns from position to position.  No chest pain.  No shortness of breath.  No abdominal pain.  Foley catheter in place with clear urine.  Objective: Vitals:   09/24/22 1259 09/24/22 2044 09/25/22 0500 09/25/22 0630  BP: 93/67 109/84  96/72  Pulse: 65 64  64  Resp: '18 20  20  '$ Temp: 98.2 F (36.8 C) 98.5 F (36.9 C)  98.4 F (36.9 C)  TempSrc: Oral Oral  Oral  SpO2: 98% 98%  100%  Weight:   122.4 kg   Height:        Intake/Output Summary (Last 24 hours) at 09/25/2022 1243 Last data filed at 09/24/2022 1842 Gross per 24 hour  Intake 100 ml  Output 450 ml  Net -350 ml   Filed Weights   09/21/22 0500 09/23/22 0452 09/25/22 0500  Weight: 121.2 kg 120.6 kg 122.4 kg    Examination:  General exam: Appears calm and comfortable  Respiratory system: Clear to auscultation.  No wheezes, no crackles, no rhonchi.  Respiratory effort normal. Cardiovascular system: S1 & S2 heard, RRR. No JVD, murmurs, rubs, gallops or clicks.  1+ right lower extremity edema. Gastrointestinal system: Abdomen is nondistended, soft and nontender. No organomegaly or masses felt. Normal bowel sounds heard. Central nervous system: Alert and oriented. No focal neurological  deficits. Extremities: 1+ right lower extremity edema.  Status post left BKA.   Scrotum:: Scrotal swelling. Skin: No rashes, lesions or ulcers Psychiatry: Judgement and insight appear normal. Mood & affect appropriate.     Data Reviewed: I have personally reviewed following labs and imaging studies  CBC: Recent Labs  Lab 09/21/22 0328 09/22/22 0034 09/23/22 0208 09/24/22 0240 09/25/22 0337  WBC 6.0 6.6 6.5 6.0 5.6  HGB 7.4* 7.5* 7.4* 7.5* 7.4*  HCT 24.7* 25.7* 25.5* 25.3* 25.0*  MCV 81.0 81.3 80.2 79.8* 79.4*  PLT 159 149* 125* 114* 104*    Basic Metabolic Panel: Recent Labs  Lab 09/21/22 0328 09/22/22 0034 09/23/22 0208 09/24/22 0240 09/25/22 0337  NA 136 137 135 137 138  K 4.0 4.1 3.8 3.7 3.7  CL 110 111 109 110 110  CO2 22 21* 21* 22 21*  GLUCOSE 195* 200* 200* 193* 157*  BUN 37* 34* 34* 32* 30*  CREATININE 1.00 0.89 0.95 0.74 0.73  CALCIUM 7.7* 7.8* 7.7* 7.8* 7.8*    GFR: Estimated Creatinine Clearance: 131.5 mL/min (by C-G formula based on SCr of 0.73 mg/dL).  Liver Function Tests: Recent Labs  Lab 09/21/22 0328 09/22/22 0034 09/23/22 0208 09/24/22 0240 09/25/22 0337  AST 15 16 14* 13* 13*  ALT '25 23 18 15 17  '$ ALKPHOS 72 78 71 73 71  BILITOT 0.4 0.5 0.3 0.5 0.5  PROT 4.7* 4.9* 4.6* 4.8* 4.7*  ALBUMIN 2.3* 2.4* 2.2* 2.4* 2.4*    CBG: Recent Labs  Lab 09/24/22 1140 09/24/22 1605 09/24/22 2042 09/25/22 0755 09/25/22 1138  GLUCAP 146* 160* 161* 145* 148*     No results found for this or any previous visit (from the past 240 hour(s)).       Radiology Studies: No results found.      Scheduled Meds:  acetaminophen  1,000 mg Oral Q8H   apixaban  5 mg Oral BID   atorvastatin  80 mg Oral Daily   Chlorhexidine Gluconate Cloth  6 each Topical Daily   clopidogrel  75 mg Oral Daily   finasteride  5 mg Oral Daily   gabapentin  300 mg Oral q1800   gabapentin  300 mg Oral Daily   hydrocortisone  20 mg Oral Daily   And    hydrocortisone  10 mg Oral Daily   insulin aspart  0-5 Units Subcutaneous QHS   insulin aspart  0-9 Units Subcutaneous TID WC   lidocaine  1 patch Transdermal Daily   melatonin  5 mg Oral q1800   midodrine  10 mg Oral TID with meals   nystatin   Topical BID   pantoprazole  40 mg Oral Daily   sodium chloride flush  10-40 mL Intracatheter Q12H   tamsulosin  0.4 mg Oral Daily   traZODone  50 mg Oral q1800   zolpidem  5 mg Oral q1800   Continuous Infusions:  sodium chloride 250 mL (09/14/22 0248)   sodium chloride 10 mL/hr at 09/24/22 1019   ertapenem Stopped (09/24/22 1722)     LOS: 24 days    Time spent: [place]    Irine Seal, MD [facility]   To contact the attending provider between 7A-7P or the covering provider during after hours 7P-7A, please log into the web site [email or web address] and access using universal [place] password for that web site. If you do not have the password, please call the hospital operator.  09/25/2022, 12:43 PM

## 2022-09-25 NOTE — Progress Notes (Signed)
OT Cancellation Note  Patient Details Name: Peter Yang MRN: [id number] DOB: 1956-07-31   Cancelled Treatment:    Reason Eval/Treat Not Completed: Patient declined, no reason specified. Patient declined therapy again. He states he is going home at discharge. Patient has not worked with OT since 11/24 and not worked with PT since 11/28. Today is his third refusal for OT. PT has already signed off. Patient educated extensively about need for participation in order to go home. Patient states "It will be hard when I go home but when I set my mind to it I can do it." He continued to decline therapy.  OT will also sign off as he is non-participatory. If OT re-ordered - patient needs to be agreeable to therapy and needs to agree to participate. He has been requiring maxislide or maximove for transfers with nursing.   [place] Diana L Remington Skalsky 09/25/2022, 10:32 AM

## 2022-09-26 DIAGNOSIS — R310 Gross hematuria: Secondary | ICD-10-CM | POA: Diagnosis not present

## 2022-09-26 DIAGNOSIS — N179 Acute kidney failure, unspecified: Secondary | ICD-10-CM | POA: Diagnosis not present

## 2022-09-26 DIAGNOSIS — I480 Paroxysmal atrial fibrillation: Secondary | ICD-10-CM | POA: Diagnosis not present

## 2022-09-26 DIAGNOSIS — N1330 Unspecified hydronephrosis: Secondary | ICD-10-CM | POA: Diagnosis not present

## 2022-09-26 LAB — GLUCOSE, CAPILLARY
Glucose-Capillary: 131 mg/dL — ABNORMAL HIGH (ref 70–99)
Glucose-Capillary: 130 mg/dL — ABNORMAL HIGH (ref 70–99)
Glucose-Capillary: 205 mg/dL — ABNORMAL HIGH (ref 70–99)
Glucose-Capillary: 197 mg/dL — ABNORMAL HIGH (ref 70–99)

## 2022-09-26 LAB — BASIC METABOLIC PANEL
Anion gap: 7 (ref 5–15)
BUN: 29 mg/dL — ABNORMAL HIGH (ref 8–23)
CO2: 21 mmol/L — ABNORMAL LOW (ref 22–32)
Calcium: 7.8 mg/dL — ABNORMAL LOW (ref 8.9–10.3)
Chloride: 110 mmol/L (ref 98–111)
Creatinine, Ser: 0.83 mg/dL (ref 0.61–1.24)
GFR, Estimated: >60 mL/min (ref >60)
Glucose, Bld: 155 mg/dL — ABNORMAL HIGH (ref 70–99)
Potassium: 3.7 mmol/L (ref 3.5–5.1)
Sodium: 138 mmol/L (ref 135–145)

## 2022-09-26 LAB — CBC
HCT: 25.1 % — ABNORMAL LOW (ref 39.0–52.0)
Hemoglobin: 7.3 g/dL — ABNORMAL LOW (ref 13.0–17.0)
MCH: 23.2 pg — ABNORMAL LOW (ref 26.0–34.0)
MCHC: 29.1 g/dL — ABNORMAL LOW (ref 30.0–36.0)
MCV: 79.9 fL — ABNORMAL LOW (ref 80.0–100.0)
Platelets: 96 10*3/uL — ABNORMAL LOW (ref 150–400)
RBC: 3.14 MIL/uL — ABNORMAL LOW (ref 4.22–5.81)
RDW: 17.8 % — ABNORMAL HIGH (ref 11.5–15.5)
WBC: 5.7 10*3/uL (ref 4.0–10.5)
nRBC: 0 % (ref 0.0–0.2)

## 2022-09-26 MED ORDER — FUROSEMIDE 10 MG/ML IJ SOLN
20.0000 mg | Freq: Once | INTRAMUSCULAR | Status: AC
  Administered 2022-09-26: 20 mg via INTRAVENOUS
  Filled 2022-09-26: qty 2

## 2022-09-26 MED ORDER — ALBUMIN HUMAN 25 % IV SOLN
25.0000 g | Freq: Once | INTRAVENOUS | Status: AC
  Administered 2022-09-26: 25 g via INTRAVENOUS
  Filled 2022-09-26: qty 100

## 2022-09-26 NOTE — Progress Notes (Signed)
PROGRESS NOTE    Peter Yang  GEX:[id number] DOB: 10-13-1956 DOA: 08/31/2022 PCP: Clovia Cuff, MD    Chief Complaint  Patient presents with   Hematuria    Brief Narrative:  65 year old man who presented to the [facility] ED 11/11 with a chief complaint of hematuria. PMHx significant for HTN, HLD, HFrEF/ICM EF 40-45%, CAD/NSTEMI, TIA, PAF (on Eliquis), T2DM, BPH (hx prostate CA).   Patient with questionable length of time of hematuria which may have been going on since 10/13 per patient.  Sent to the emergency department by SNF staff on 11/11.   At the emergency department he developed hypotension with a BP 83/63. WBC 14.2, LA 5.0 > 7.9. Code Sepsis was called. Blood cultures and urine cultures were obtained. Patient was given fluid bolus per sepsis guidelines.  Patient with complicated infectious history-started on meropenem and amoxicillin by [facility].  Urology was consulted for hematuria and concern for Fournier's.  Recommends monitoring with possible cystoscopy once Eliquis washout. [facility] admitted patient.   11/11 Admit by [facility], BC>, UC>, PCCM consult, Mero and amoxicillin> 11/12 Started on peripheral Levophed for persistent hypotension, albumin 11/13 Troponin rising from 263 to 771, DVT/PE ruled out 11/14 dobutamine started for low coox, lower EF on echo 11/15 trial of restarting heparin 11/17 off dobutamine 11/19 coox 93, remains on pressors 11/20 Coox 67, CVP 8, on 3 norepi 11/21 Coox 75.1, 4 norepi  11/23 Norepi weaned to 25mg, hypotension into 80's overnight > increased levo. Robust UOP despite no lasix in 72 hours.   11/24 Levophed weaned off briefly yesterday and restarted overnight 11/26 Patient responsive to IVF resuscitation. Initial urine studies suggest hypovolemic hyponatremia 11/27 Ongoing mild hypotension, goal MAP 60. Remains on NE 263m. Midodrine increased, droxidopa added. Mild persistent hematuria with clots.       Assessment & Plan:   Principal Problem:   Gross  hematuria Active Problems:   Acute kidney injury (HCCrab [place]  Coronary artery disease due to lipid rich plaque   UTI (urinary tract infection)   Borderline low blood pressure determined by examination   Lactic acidosis   Diabetic polyneuropathy associated with type 2 diabetes mellitus (HCC)   PAF (paroxysmal atrial fibrillation) (HCC)   Acute on chronic HFrEF (heart failure with reduced ejection fraction) (HCC)   Pressure ulcer of sacral region, stage 2 (HCC)   Dyslipidemia   Cellulitis of scrotum   Acute prostatitis   Septic shock (HCC)   Hyponatremia   Cardiogenic shock (HCC)   Physical deconditioning   Palliative care by specialist   Goals of care, counseling/discussion   AKI (acute kidney injury) (HCHazard  Mood altered   Medication management   Need for emotional support   Pain   High risk medication use   Counseling and coordination of care   Acute on chronic systolic congestive heart failure (HCC)   HFrEF (heart failure with reduced ejection fraction) (HCC)   Orthostatic hypotension   Hydronephrosis  Shock, mixed septic from UTI >cardiogenic shock with new depressed EF>hypovolemic shock d/t overdiuresis Hypovolemic hyponatremia Clinically resolved infection and on appropriate antibiotics. Echo with EF 30% with concerns for cardiogenic shock however patient net negative volume. -Cardiology following intermittently while on any diuretics. -Droxidopa has been weaned off currently on home regimen midodrine.  - Continue hydrocortisone; decreased to '20mg'$  daily /'10mg'$  qhs as of 11/27 -Plan to ultimately taper midodrine, dose initially increased to '20mg'$  tid frrom '15mg'$  tid per PCCM on 11/27. Now on midodrine to '10mg'$  TID per cardiology -Systolic  blood pressures currently soft.  -Dr. Wyline Copas discussed with Critical Care who is familiar with patient's history. Pt historically has chronically low bp with sbp as low as 70-80's. Per Critical Care, recommendation to wean droxidopa to off, which  has been weaned off. -Continue empiric IV antibiotics. -Follow.   Acute on chronic HFrEF, ICM  CAD with stent in 05/2022 > 09/03/22 TTE> EF now 30-35, G2DD  pAF -Concerns for mixed cardiogenic shock during the hospitalization. - Cardiology consulted; not a candidate for home inotropes, revascularization, transplant or LVAD - Unable to tolerate GDMT due to persistent hypotension -Continue Eliquis, Plavix.   -Cardiology following intermittently and patient unable to be started on GDMT while on midodrine and droxidopa.   -Droxidopa has been weaned off.   -Patient with some complaints of shortness of breath, noted to have 1+ lower extremity edema, and scrotal swelling. -BP soft and as such we will give a dose of IV albumin followed by Lasix 20 mg x 1. -Strict I's and O's. -Cardiology has signed off.   -Outpatient follow-up with primary cardiologist.     Prostatitis Scrotal Cellulitis H/O ESBL E.Coli and CRE Proteus - ID consulted, appreciate recs -Last seen by ID on 09/05/2022.  Plan is for ertapenem 1 g daily for 4 weeks, end date 09/27/2022 - PICC in place  - Outpatient ID f/u, established in [place]   Hematuria due to UTI Known prostate cancer, under surveillance with urology - Trend H&H - Keep Foley until cleared by Urology post-discharge - Continue Eliquis/Plavix, monitor closely in the setting of hematuria - Continue Flomax, Proscar -Will need outpatient follow-up with urology likely will be discharged with Foley catheter in place until outpatient follow-up.   Hyponatremia, hypovolemic Mild AKI - improved Hypocalcemia  -Electrolytes improved, sodium at 138, potassium at 3.7.   -Creatinine down to 0.83.   -Avoid nephrotoxic agents.   DM2 -Hemoglobin A1c 6.6 (08/03/2022). -CBG 131 this morning.  -Continue SSI.     Deconditioning - PT/OT while in-house; recommended for SNF -Patient noted to be refusing PT therapies yesterday.. - Utilize prosthesis as able - patient  does NOT want to return to prior SNF [facility]). TOC following and looking for SNF for patient.   Chronic Stage 2 sacral ulcer, POA - WOC   Insomnia, phantom limb pain LLE -Continue reduced dose gabapentin, melatonin, trazodone, Ambien nightly as needed.  - Ortho tech consult for LLE sleeve which has been placed.   Chronic Critical Illness  - PMT consulted, appreciate recommendations - Patient acknowledges medical measures have not changed QOL - Continues to desire current scope of care - DNR code status       DVT prophylaxis: Eliquis Code Status: DNR Family Communication: Updated patient.  No family at bedside. Disposition: SNF  Status is: Inpatient Remains inpatient appropriate because: Severitiy of illness   Consultants:  Urology: Dr Gloriann Loan 08/31/2022 PCCM: Dr Patsey Berthold 09/01/2022 Wound care: Julien Girt, RN 09/02/2022 ID: Dr. Juleen China 09/03/2022 Palliaitve Care: Dr Rowe Pavy 09/03/2022 Cardiology: Dr Gasper Sells 09/04/2022  Procedures:  CT renal stone protocol 08/31/2022 CT angiogram chest 09/02/2022 Chest x-ray 09/02/2022 2D echo 09/03/2022 Bilateral lower extremity Dopplers 09/02/2022  Antimicrobials:  Anti-infectives (From admission, onward)    Start     Dose/Rate Route Frequency Ordered Stop   09/05/22 1600  ertapenem (INVANZ) 1,000 mg in sodium chloride 0.9 % 100 mL IVPB        1 g 200 mL/hr over 30 Minutes Intravenous Every 24 hours 09/05/22 1357 09/29/22 2359   09/03/22  0800  meropenem (MERREM) 1 g in sodium chloride 0.9 % 100 mL IVPB  Status:  Discontinued        1 g 200 mL/hr over 30 Minutes Intravenous Every 8 hours 09/03/22 0201 09/05/22 1357   09/01/22 1100  meropenem (MERREM) 1 g in sodium chloride 0.9 % 100 mL IVPB  Status:  Discontinued        1 g 200 mL/hr over 30 Minutes Intravenous Every 12 hours 09/01/22 0416 09/03/22 0201   09/01/22 1100  fluconazole (DIFLUCAN) IVPB 200 mg  Status:  Discontinued        200 mg 100 mL/hr over 60 Minutes  Intravenous Every 24 hours 09/01/22 1020 09/04/22 1309   09/01/22 0600  meropenem (MERREM) 1 g in sodium chloride 0.9 % 100 mL IVPB  Status:  Discontinued        1 g 200 mL/hr over 30 Minutes Intravenous Every 8 hours 08/31/22 2136 09/01/22 0416   08/31/22 2200  sulfamethoxazole-trimethoprim (BACTRIM DS) 800-160 MG per tablet 1 tablet  Status:  Discontinued        1 tablet Oral 2 times daily 08/31/22 2011 08/31/22 2129   08/31/22 2200  amoxicillin (AMOXIL) capsule 500 mg  Status:  Discontinued        500 mg Oral Every 8 hours 08/31/22 2131 09/03/22 1417   08/31/22 2145  meropenem (MERREM) 1 g in sodium chloride 0.9 % 100 mL IVPB        1 g 200 mL/hr over 30 Minutes Intravenous  Once 08/31/22 2136 09/01/22 0820   08/31/22 1815  cefTRIAXone (ROCEPHIN) 1 g in sodium chloride 0.9 % 100 mL IVPB        1 g 200 mL/hr over 30 Minutes Intravenous  Once 08/31/22 1814 08/31/22 1911         Subjective: Patient sitting up in bed.  Some complaints of shortness of breath/heaviness.  States feels a little short of breath after eating.  Denies any abdominal pain.  Foley catheter with some slight blood-tinged.   Objective: Vitals:   09/25/22 1507 09/25/22 2003 09/26/22 0434 09/26/22 0500  BP: 117/69 105/72 93/67   Pulse: 73 70 60   Resp:  18 18   Temp: 98.9 F (37.2 C) 98.8 F (37.1 C) 97.7 F (36.5 C)   TempSrc: Oral Oral    SpO2:  98% 100%   Weight:    123.9 kg  Height:        Intake/Output Summary (Last 24 hours) at 09/26/2022 1205 Last data filed at 09/26/2022 0500 Gross per 24 hour  Intake 570 ml  Output 1175 ml  Net -605 ml    Filed Weights   09/23/22 0452 09/25/22 0500 09/26/22 0500  Weight: 120.6 kg 122.4 kg 123.9 kg    Examination:  General exam: NAD. Respiratory system: Some decreased breath sounds in the bases otherwise clear.  No wheezes, no crackles, no rhonchi.  Fair air movement.  Speaking in full sentences.   Cardiovascular system: Regular rate rhythm no murmurs  rubs or gallops.  No JVD.  1+ right lower extremity edema.  Gastrointestinal system: Abdomen is soft, nontender, nondistended, positive bowel sounds.  No rebound.  No guarding.  Central nervous system: Alert and oriented. No focal neurological deficits. Extremities: 1+ right lower extremity edema.  Status post left BKA.   Scrotum:: Scrotal swelling. Skin: No rashes, lesions or ulcers Psychiatry: Judgement and insight appear normal. Mood & affect appropriate.     Data Reviewed: I have personally  reviewed following labs and imaging studies  CBC: Recent Labs  Lab 09/22/22 0034 09/23/22 0208 09/24/22 0240 09/25/22 0337 09/26/22 0354  WBC 6.6 6.5 6.0 5.6 5.7  HGB 7.5* 7.4* 7.5* 7.4* 7.3*  HCT 25.7* 25.5* 25.3* 25.0* 25.1*  MCV 81.3 80.2 79.8* 79.4* 79.9*  PLT 149* 125* 114* 104* 96*     Basic Metabolic Panel: Recent Labs  Lab 09/22/22 0034 09/23/22 0208 09/24/22 0240 09/25/22 0337 09/26/22 0354  NA 137 135 137 138 138  K 4.1 3.8 3.7 3.7 3.7  CL 111 109 110 110 110  CO2 21* 21* 22 21* 21*  GLUCOSE 200* 200* 193* 157* 155*  BUN 34* 34* 32* 30* 29*  CREATININE 0.89 0.95 0.74 0.73 0.83  CALCIUM 7.8* 7.7* 7.8* 7.8* 7.8*     GFR: Estimated Creatinine Clearance: 127.5 mL/min (by C-G formula based on SCr of 0.83 mg/dL).  Liver Function Tests: Recent Labs  Lab 09/21/22 0328 09/22/22 0034 09/23/22 0208 09/24/22 0240 09/25/22 0337  AST 15 16 14* 13* 13*  ALT '25 23 18 15 17  '$ ALKPHOS 72 78 71 73 71  BILITOT 0.4 0.5 0.3 0.5 0.5  PROT 4.7* 4.9* 4.6* 4.8* 4.7*  ALBUMIN 2.3* 2.4* 2.2* 2.4* 2.4*     CBG: Recent Labs  Lab 09/25/22 0755 09/25/22 1138 09/25/22 1613 09/25/22 2121 09/26/22 0750  GLUCAP 145* 148* 174* 163* 131*      No results found for this or any previous visit (from the past 240 hour(s)).       Radiology Studies: No results found.      Scheduled Meds:  acetaminophen  1,000 mg Oral Q8H   apixaban  5 mg Oral BID   atorvastatin  80  mg Oral Daily   Chlorhexidine Gluconate Cloth  6 each Topical Daily   clopidogrel  75 mg Oral Daily   finasteride  5 mg Oral Daily   gabapentin  300 mg Oral q1800   gabapentin  300 mg Oral Daily   hydrocortisone  20 mg Oral Daily   And   hydrocortisone  10 mg Oral Daily   insulin aspart  0-5 Units Subcutaneous QHS   insulin aspart  0-9 Units Subcutaneous TID WC   lidocaine  1 patch Transdermal Daily   melatonin  5 mg Oral q1800   midodrine  10 mg Oral TID with meals   nystatin   Topical BID   pantoprazole  40 mg Oral Daily   sodium chloride flush  10-40 mL Intracatheter Q12H   tamsulosin  0.4 mg Oral Daily   traZODone  50 mg Oral q1800   zolpidem  5 mg Oral q1800   Continuous Infusions:  sodium chloride 250 mL (09/14/22 0248)   sodium chloride 10 mL/hr at 09/24/22 1019   ertapenem 1,000 mg (09/25/22 1714)     LOS: 25 days    Time spent: [place], MD [facility]   To contact the attending provider between 7A-7P or the covering provider during after hours 7P-7A, please log into the web site [email or web address] and access using universal [place] password for that web site. If you do not have the password, please call the hospital operator.  09/26/2022, 12:05 PM

## 2022-09-27 DIAGNOSIS — I480 Paroxysmal atrial fibrillation: Secondary | ICD-10-CM | POA: Diagnosis not present

## 2022-09-27 DIAGNOSIS — N1330 Unspecified hydronephrosis: Secondary | ICD-10-CM | POA: Diagnosis not present

## 2022-09-27 DIAGNOSIS — N179 Acute kidney failure, unspecified: Secondary | ICD-10-CM | POA: Diagnosis not present

## 2022-09-27 DIAGNOSIS — R310 Gross hematuria: Secondary | ICD-10-CM | POA: Diagnosis not present

## 2022-09-27 LAB — CBC
HCT: 24.7 % — ABNORMAL LOW (ref 39.0–52.0)
Hemoglobin: 7.4 g/dL — ABNORMAL LOW (ref 13.0–17.0)
MCH: 23.6 pg — ABNORMAL LOW (ref 26.0–34.0)
MCHC: 30 g/dL (ref 30.0–36.0)
MCV: 78.9 fL — ABNORMAL LOW (ref 80.0–100.0)
Platelets: 90 10*3/uL — ABNORMAL LOW (ref 150–400)
RBC: 3.13 MIL/uL — ABNORMAL LOW (ref 4.22–5.81)
RDW: 17.7 % — ABNORMAL HIGH (ref 11.5–15.5)
WBC: 5.6 10*3/uL (ref 4.0–10.5)
nRBC: 0 % (ref 0.0–0.2)

## 2022-09-27 LAB — BASIC METABOLIC PANEL
Anion gap: 7 (ref 5–15)
BUN: 28 mg/dL — ABNORMAL HIGH (ref 8–23)
CO2: 21 mmol/L — ABNORMAL LOW (ref 22–32)
Calcium: 7.8 mg/dL — ABNORMAL LOW (ref 8.9–10.3)
Chloride: 107 mmol/L (ref 98–111)
Creatinine, Ser: 0.78 mg/dL (ref 0.61–1.24)
GFR, Estimated: >60 mL/min (ref >60)
Glucose, Bld: 162 mg/dL — ABNORMAL HIGH (ref 70–99)
Potassium: 3.6 mmol/L (ref 3.5–5.1)
Sodium: 135 mmol/L (ref 135–145)

## 2022-09-27 LAB — GLUCOSE, CAPILLARY
Glucose-Capillary: 135 mg/dL — ABNORMAL HIGH (ref 70–99)
Glucose-Capillary: 129 mg/dL — ABNORMAL HIGH (ref 70–99)
Glucose-Capillary: 175 mg/dL — ABNORMAL HIGH (ref 70–99)
Glucose-Capillary: 176 mg/dL — ABNORMAL HIGH (ref 70–99)

## 2022-09-27 NOTE — Progress Notes (Signed)
PROGRESS NOTE    Peter Yang  XBM:[id number] DOB: 1956-09-27 DOA: 08/31/2022 PCP: Clovia Cuff, MD    Chief Complaint  Patient presents with   Hematuria    Brief Narrative:  66 year old man who presented to the [facility] ED 11/11 with a chief complaint of hematuria. PMHx significant for HTN, HLD, HFrEF/ICM EF 40-45%, CAD/NSTEMI, TIA, PAF (on Eliquis), T2DM, BPH (hx prostate CA).   Patient with questionable length of time of hematuria which may have been going on since 10/13 per patient.  Sent to the emergency department by SNF staff on 11/11.   At the emergency department he developed hypotension with a BP 83/63. WBC 14.2, LA 5.0 > 7.9. Code Sepsis was called. Blood cultures and urine cultures were obtained. Patient was given fluid bolus per sepsis guidelines.  Patient with complicated infectious history-started on meropenem and amoxicillin by [facility].  Urology was consulted for hematuria and concern for Fournier's.  Recommends monitoring with possible cystoscopy once Eliquis washout. [facility] admitted patient.   11/11 Admit by [facility], BC>, UC>, PCCM consult, Mero and amoxicillin> 11/12 Started on peripheral Levophed for persistent hypotension, albumin 11/13 Troponin rising from 263 to 771, DVT/PE ruled out 11/14 dobutamine started for low coox, lower EF on echo 11/15 trial of restarting heparin 11/17 off dobutamine 11/19 coox 93, remains on pressors 11/20 Coox 67, CVP 8, on 3 norepi 11/21 Coox 75.1, 4 norepi  11/23 Norepi weaned to 7mg, hypotension into 80's overnight > increased levo. Robust UOP despite no lasix in 72 hours.   11/24 Levophed weaned off briefly yesterday and restarted overnight 11/26 Patient responsive to IVF resuscitation. Initial urine studies suggest hypovolemic hyponatremia 11/27 Ongoing mild hypotension, goal MAP 60. Remains on NE 253m. Midodrine increased, droxidopa added. Mild persistent hematuria with clots.       Assessment & Plan:   Principal Problem:   Gross  hematuria Active Problems:   Acute kidney injury ([facility]  Coronary artery disease due to lipid rich plaque   UTI (urinary tract infection)   Borderline low blood pressure determined by examination   Lactic acidosis   Diabetic polyneuropathy associated with type 2 diabetes mellitus (HCC)   PAF (paroxysmal atrial fibrillation) (HCC)   Acute on chronic HFrEF (heart failure with reduced ejection fraction) (HCC)   Pressure ulcer of sacral region, stage 2 (HCC)   Dyslipidemia   Cellulitis of scrotum   Acute prostatitis   Septic shock (HCC)   Hyponatremia   Cardiogenic shock (HCC)   Physical deconditioning   Palliative care by specialist   Goals of care, counseling/discussion   AKI (acute kidney injury) ([facility]  Mood altered   Medication management   Need for emotional support   Pain   High risk medication use   Counseling and coordination of care   Acute on chronic systolic congestive heart failure (HCC)   HFrEF (heart failure with reduced ejection fraction) (HCC)   Orthostatic hypotension   Hydronephrosis  Shock, mixed septic from UTI >cardiogenic shock with new depressed EF>hypovolemic shock d/t overdiuresis Hypovolemic hyponatremia Clinically resolved infection and on appropriate antibiotics. Echo with EF 30% with concerns for cardiogenic shock however patient net negative volume. -Cardiology following intermittently while on any diuretics. -Droxidopa has been weaned off currently on home regimen midodrine.  - Continue hydrocortisone; decreased to '20mg'$  daily /'10mg'$  qhs as of 11/27 -Plan to ultimately taper midodrine, dose initially increased to '20mg'$  tid frrom '15mg'$  tid per PCCM on 11/27. Now on midodrine to '10mg'$  TID per cardiology -Systolic  blood pressures currently soft.  -Dr. Wyline Copas discussed with Critical Care who is familiar with patient's history. Pt historically has chronically low bp with sbp as low as 70-80's. Per Critical Care, recommendation to wean droxidopa to off, which  has been weaned off. -Continue empiric IV antibiotics. -Follow.   Acute on chronic HFrEF, ICM  CAD with stent in 05/2022 > 09/03/22 TTE> EF now 30-35, G2DD  pAF -Concerns for mixed cardiogenic shock during the hospitalization. - Cardiology consulted; not a candidate for home inotropes, revascularization, transplant or LVAD - Unable to tolerate GDMT due to persistent hypotension -Continue Eliquis, Plavix.   -Cardiology following intermittently and patient unable to be started on GDMT while on midodrine and droxidopa.   -Droxidopa has been weaned off.   -Patient with some complaints of shortness of breath, noted to have 1+ lower extremity edema, and scrotal swelling, 09/26/2022. -BP soft and as such patient given a dose of IV albumin followed by Lasix 20 mg IV x 1 with urine output of 1.950 L over the past 24 hours.  -Strict I's and O's. -Cardiology has signed off.   -Outpatient follow-up with primary cardiologist.     Prostatitis Scrotal Cellulitis H/O ESBL E.Coli and CRE Proteus - ID consulted, appreciate recs -Last seen by ID on 09/05/2022.  Plan is for ertapenem 1 g daily for 4 weeks, end date 09/27/2022 - PICC in place  - Outpatient ID f/u, established in [place]   Hematuria due to UTI Known prostate cancer, under surveillance with urology - Trend H&H - Keep Foley until cleared by Urology post-discharge - Continue Eliquis/Plavix, monitor closely in the setting of hematuria - Continue Flomax, Proscar -Will need outpatient follow-up with urology likely will be discharged with Foley catheter in place until outpatient follow-up.   Hyponatremia, hypovolemic Mild AKI - improved Hypocalcemia  -Electrolytes improved, sodium at 135, potassium at 3.6.   -Creatinine down to 0. 0.78.   -Avoid nephrotoxic agents.   DM2 -Hemoglobin A1c 6.6 (08/03/2022). -CBG 135 this morning.  -Continue SSI.     Deconditioning - PT/OT while in-house; recommended for SNF -Patient noted to be  refusing PT therapies early on in the hospitalization... - Utilize prosthesis as able - patient does NOT want to return to prior SNF [facility]). TOC following and looking for a different SNF for patient.   Chronic Stage 2 sacral ulcer, POA - WOC   Insomnia, phantom limb pain LLE -Continue reduced dose gabapentin, melatonin, trazodone, Ambien nightly as needed.  - Ortho tech consult for LLE sleeve which has been placed.   Chronic Critical Illness  - PMT consulted, appreciate recommendations - Patient acknowledges medical measures have not changed QOL - Continues to desire current scope of care - DNR code status       DVT prophylaxis: Eliquis Code Status: DNR Family Communication: Updated patient.  No family at bedside. Disposition: SNF  Status is: Inpatient Remains inpatient appropriate because: Severitiy of illness   Consultants:  Urology: Dr Gloriann Loan 08/31/2022 PCCM: Dr Patsey Berthold 09/01/2022 Wound care: Julien Girt, RN 09/02/2022 ID: Dr. Juleen China 09/03/2022 Palliaitve Care: Dr Rowe Pavy 09/03/2022 Cardiology: Dr Gasper Sells 09/04/2022  Procedures:  CT renal stone protocol 08/31/2022 CT angiogram chest 09/02/2022 Chest x-ray 09/02/2022 2D echo 09/03/2022 Bilateral lower extremity Dopplers 09/02/2022  Antimicrobials:  Anti-infectives (From admission, onward)    Start     Dose/Rate Route Frequency Ordered Stop   09/05/22 1600  ertapenem (INVANZ) 1,000 mg in sodium chloride 0.9 % 100 mL IVPB  1 g 200 mL/hr over 30 Minutes Intravenous Every 24 hours 09/05/22 1357 09/29/22 2359   09/03/22 0800  meropenem (MERREM) 1 g in sodium chloride 0.9 % 100 mL IVPB  Status:  Discontinued        1 g 200 mL/hr over 30 Minutes Intravenous Every 8 hours 09/03/22 0201 09/05/22 1357   09/01/22 1100  meropenem (MERREM) 1 g in sodium chloride 0.9 % 100 mL IVPB  Status:  Discontinued        1 g 200 mL/hr over 30 Minutes Intravenous Every 12 hours 09/01/22 0416 09/03/22 0201   09/01/22 1100   fluconazole (DIFLUCAN) IVPB 200 mg  Status:  Discontinued        200 mg 100 mL/hr over 60 Minutes Intravenous Every 24 hours 09/01/22 1020 09/04/22 1309   09/01/22 0600  meropenem (MERREM) 1 g in sodium chloride 0.9 % 100 mL IVPB  Status:  Discontinued        1 g 200 mL/hr over 30 Minutes Intravenous Every 8 hours 08/31/22 2136 09/01/22 0416   08/31/22 2200  sulfamethoxazole-trimethoprim (BACTRIM DS) 800-160 MG per tablet 1 tablet  Status:  Discontinued        1 tablet Oral 2 times daily 08/31/22 2011 08/31/22 2129   08/31/22 2200  amoxicillin (AMOXIL) capsule 500 mg  Status:  Discontinued        500 mg Oral Every 8 hours 08/31/22 2131 09/03/22 1417   08/31/22 2145  meropenem (MERREM) 1 g in sodium chloride 0.9 % 100 mL IVPB        1 g 200 mL/hr over 30 Minutes Intravenous  Once 08/31/22 2136 09/01/22 0820   08/31/22 1815  cefTRIAXone (ROCEPHIN) 1 g in sodium chloride 0.9 % 100 mL IVPB        1 g 200 mL/hr over 30 Minutes Intravenous  Once 08/31/22 1814 08/31/22 1911         Subjective: Sitting up in bed.  States breathing feels better today.  No chest pain.  No abdominal pain.  Tolerating current diet.  Not sure how much urine output he had over the past 24 hours.   Objective: Vitals:   09/26/22 1503 09/26/22 2013 09/27/22 0436 09/27/22 0437  BP: 100/60 (!) 106/53  (!) 93/59  Pulse: 78 66  (!) 57  Resp: '18 20  16  '$ Temp: 98 F (36.7 C) 97.9 F (36.6 C)  97.6 F (36.4 C)  TempSrc: Oral Oral  Oral  SpO2: 98% 100%  99%  Weight:   117.6 kg   Height:        Intake/Output Summary (Last 24 hours) at 09/27/2022 1302 Last data filed at 09/27/2022 0439 Gross per 24 hour  Intake 210 ml  Output 1950 ml  Net -1740 ml    Filed Weights   09/25/22 0500 09/26/22 0500 09/27/22 0436  Weight: 122.4 kg 123.9 kg 117.6 kg    Examination:  General exam: NAD. Respiratory system: Some decreased breath sounds in the bases otherwise clear.  No wheezes, no crackles, no rhonchi.  Fair air  movement.  Speaking in full sentences.   Cardiovascular system: RRR no murmurs rubs or gallops.  No JVD.  1+ right lower extremity edema.  Gastrointestinal system: Abdomen is soft, nontender, nondistended, positive bowel sounds.  No rebound.  No guarding.  Central nervous system: Alert and oriented. No focal neurological deficits. Extremities: 1+ right lower extremity edema.  Status post left BKA.   Scrotum:: Scrotal swelling with slight improvement. Skin:  No rashes, lesions or ulcers Psychiatry: Judgement and insight appear normal. Mood & affect appropriate.     Data Reviewed: I have personally reviewed following labs and imaging studies  CBC: Recent Labs  Lab 09/23/22 0208 09/24/22 0240 09/25/22 0337 09/26/22 0354 09/27/22 0329  WBC 6.5 6.0 5.6 5.7 5.6  HGB 7.4* 7.5* 7.4* 7.3* 7.4*  HCT 25.5* 25.3* 25.0* 25.1* 24.7*  MCV 80.2 79.8* 79.4* 79.9* 78.9*  PLT 125* 114* 104* 96* 90*     Basic Metabolic Panel: Recent Labs  Lab 09/23/22 0208 09/24/22 0240 09/25/22 0337 09/26/22 0354 09/27/22 0329  NA 135 137 138 138 135  K 3.8 3.7 3.7 3.7 3.6  CL 109 110 110 110 107  CO2 21* 22 21* 21* 21*  GLUCOSE 200* 193* 157* 155* 162*  BUN 34* 32* 30* 29* 28*  CREATININE 0.95 0.74 0.73 0.83 0.78  CALCIUM 7.7* 7.8* 7.8* 7.8* 7.8*     GFR: Estimated Creatinine Clearance: 129 mL/min (by C-G formula based on SCr of 0.78 mg/dL).  Liver Function Tests: Recent Labs  Lab 09/21/22 0328 09/22/22 0034 09/23/22 0208 09/24/22 0240 09/25/22 0337  AST 15 16 14* 13* 13*  ALT '25 23 18 15 17  '$ ALKPHOS 72 78 71 73 71  BILITOT 0.4 0.5 0.3 0.5 0.5  PROT 4.7* 4.9* 4.6* 4.8* 4.7*  ALBUMIN 2.3* 2.4* 2.2* 2.4* 2.4*     CBG: Recent Labs  Lab 09/26/22 1225 09/26/22 1738 09/26/22 2052 09/27/22 0738 09/27/22 1147  GLUCAP 130* 205* 197* 135* 129*      No results found for this or any previous visit (from the past 240 hour(s)).       Radiology Studies: No results  found.      Scheduled Meds:  acetaminophen  1,000 mg Oral Q8H   apixaban  5 mg Oral BID   atorvastatin  80 mg Oral Daily   Chlorhexidine Gluconate Cloth  6 each Topical Daily   clopidogrel  75 mg Oral Daily   finasteride  5 mg Oral Daily   gabapentin  300 mg Oral q1800   gabapentin  300 mg Oral Daily   hydrocortisone  20 mg Oral Daily   And   hydrocortisone  10 mg Oral Daily   insulin aspart  0-5 Units Subcutaneous QHS   insulin aspart  0-9 Units Subcutaneous TID WC   lidocaine  1 patch Transdermal Daily   melatonin  5 mg Oral q1800   midodrine  10 mg Oral TID with meals   nystatin   Topical BID   pantoprazole  40 mg Oral Daily   sodium chloride flush  10-40 mL Intracatheter Q12H   tamsulosin  0.4 mg Oral Daily   traZODone  50 mg Oral q1800   zolpidem  5 mg Oral q1800   Continuous Infusions:  sodium chloride 250 mL (09/14/22 0248)   sodium chloride 10 mL/hr at 09/24/22 1019   ertapenem 1,000 mg (09/26/22 1640)     LOS: 26 days    Time spent: [place], MD [facility]   To contact the attending provider between 7A-7P or the covering provider during after hours 7P-7A, please log into the web site [email or web address] and access using universal [place] password for that web site. If you do not have the password, please call the hospital operator.  09/27/2022, 1:02 PM

## 2022-09-28 DIAGNOSIS — N179 Acute kidney failure, unspecified: Secondary | ICD-10-CM | POA: Diagnosis not present

## 2022-09-28 DIAGNOSIS — I480 Paroxysmal atrial fibrillation: Secondary | ICD-10-CM | POA: Diagnosis not present

## 2022-09-28 DIAGNOSIS — N1330 Unspecified hydronephrosis: Secondary | ICD-10-CM | POA: Diagnosis not present

## 2022-09-28 DIAGNOSIS — R310 Gross hematuria: Secondary | ICD-10-CM | POA: Diagnosis not present

## 2022-09-28 LAB — GLUCOSE, CAPILLARY
Glucose-Capillary: 145 mg/dL — ABNORMAL HIGH (ref 70–99)
Glucose-Capillary: 144 mg/dL — ABNORMAL HIGH (ref 70–99)
Glucose-Capillary: 189 mg/dL — ABNORMAL HIGH (ref 70–99)
Glucose-Capillary: 180 mg/dL — ABNORMAL HIGH (ref 70–99)

## 2022-09-28 LAB — BASIC METABOLIC PANEL
Anion gap: 4 — ABNORMAL LOW (ref 5–15)
BUN: 30 mg/dL — ABNORMAL HIGH (ref 8–23)
CO2: 23 mmol/L (ref 22–32)
Calcium: 8.1 mg/dL — ABNORMAL LOW (ref 8.9–10.3)
Chloride: 111 mmol/L (ref 98–111)
Creatinine, Ser: 0.8 mg/dL (ref 0.61–1.24)
GFR, Estimated: >60 mL/min (ref >60)
Glucose, Bld: 173 mg/dL — ABNORMAL HIGH (ref 70–99)
Potassium: 3.6 mmol/L (ref 3.5–5.1)
Sodium: 138 mmol/L (ref 135–145)

## 2022-09-28 LAB — CBC
HCT: 24.9 % — ABNORMAL LOW (ref 39.0–52.0)
Hemoglobin: 7.4 g/dL — ABNORMAL LOW (ref 13.0–17.0)
MCH: 23.3 pg — ABNORMAL LOW (ref 26.0–34.0)
MCHC: 29.7 g/dL — ABNORMAL LOW (ref 30.0–36.0)
MCV: 78.5 fL — ABNORMAL LOW (ref 80.0–100.0)
Platelets: 85 10*3/uL — ABNORMAL LOW (ref 150–400)
RBC: 3.17 MIL/uL — ABNORMAL LOW (ref 4.22–5.81)
RDW: 17.8 % — ABNORMAL HIGH (ref 11.5–15.5)
WBC: 6.2 10*3/uL (ref 4.0–10.5)
nRBC: 0 % (ref 0.0–0.2)

## 2022-09-28 MED ORDER — ALBUMIN HUMAN 25 % IV SOLN
25.0000 g | Freq: Once | INTRAVENOUS | Status: AC
  Administered 2022-09-28: 25 g via INTRAVENOUS
  Filled 2022-09-28: qty 100

## 2022-09-28 MED ORDER — FUROSEMIDE 10 MG/ML IJ SOLN
20.0000 mg | Freq: Once | INTRAMUSCULAR | Status: AC
  Administered 2022-09-28: 20 mg via INTRAVENOUS
  Filled 2022-09-28: qty 2

## 2022-09-28 NOTE — Progress Notes (Signed)
Patient refuse catheter irrigation said he is awaiting reassessment from urology. Patient verbalized he is not experiencing any discomfort at this time.

## 2022-09-28 NOTE — Progress Notes (Signed)
PROGRESS NOTE    Peter Yang  ZOX:[id number] DOB: 11-Jan-1956 DOA: 08/31/2022 PCP: Clovia Cuff, MD    Chief Complaint  Patient presents with   Hematuria    Brief Narrative:  66 year old man who presented to the [facility] ED 11/11 with a chief complaint of hematuria. PMHx significant for HTN, HLD, HFrEF/ICM EF 40-45%, CAD/NSTEMI, TIA, PAF (on Eliquis), T2DM, BPH (hx prostate CA).   Patient with questionable length of time of hematuria which may have been going on since 10/13 per patient.  Sent to the emergency department by SNF staff on 11/11.   At the emergency department he developed hypotension with a BP 83/63. WBC 14.2, LA 5.0 > 7.9. Code Sepsis was called. Blood cultures and urine cultures were obtained. Patient was given fluid bolus per sepsis guidelines.  Patient with complicated infectious history-started on meropenem and amoxicillin by [facility].  Urology was consulted for hematuria and concern for Fournier's.  Recommends monitoring with possible cystoscopy once Eliquis washout. [facility] admitted patient.   11/11 Admit by [facility], BC>, UC>, PCCM consult, Mero and amoxicillin> 11/12 Started on peripheral Levophed for persistent hypotension, albumin 11/13 Troponin rising from 263 to 771, DVT/PE ruled out 11/14 dobutamine started for low coox, lower EF on echo 11/15 trial of restarting heparin 11/17 off dobutamine 11/19 coox 93, remains on pressors 11/20 Coox 67, CVP 8, on 3 norepi 11/21 Coox 75.1, 4 norepi  11/23 Norepi weaned to 57mg, hypotension into 80's overnight > increased levo. Robust UOP despite no lasix in 72 hours.   11/24 Levophed weaned off briefly yesterday and restarted overnight 11/26 Patient responsive to IVF resuscitation. Initial urine studies suggest hypovolemic hyponatremia 11/27 Ongoing mild hypotension, goal MAP 60. Remains on NE 242m. Midodrine increased, droxidopa added. Mild persistent hematuria with clots.       Assessment & Plan:   Principal Problem:   Gross  hematuria Active Problems:   Acute kidney injury ([place]  Coronary artery disease due to lipid rich plaque   UTI (urinary tract infection)   Borderline low blood pressure determined by examination   Lactic acidosis   Diabetic polyneuropathy associated with type 2 diabetes mellitus (HCC)   PAF (paroxysmal atrial fibrillation) (HCC)   Acute on chronic HFrEF (heart failure with reduced ejection fraction) (HCC)   Pressure ulcer of sacral region, stage 2 (HCC)   Dyslipidemia   Cellulitis of scrotum   Acute prostatitis   Septic shock (HCC)   Hyponatremia   Cardiogenic shock (HCC)   Physical deconditioning   Palliative care by specialist   Goals of care, counseling/discussion   AKI [place]acute [place] injury) ([place]  Mood altered   Medication management   Need for emotional support   Pain   High risk medication use   Counseling and coordination of care   Acute on chronic systolic congestive heart failure (HCC)   HFrEF (heart failure with reduced ejection fraction) (HCC)   Orthostatic hypotension   Hydronephrosis  Shock, mixed septic from UTI >cardiogenic shock with new depressed EF>hypovolemic shock d/t overdiuresis Hypovolemic hyponatremia Clinically resolved infection and on appropriate antibiotics. Echo with EF 30% with concerns for cardiogenic shock however patient net negative volume. -Cardiology following intermittently while on any diuretics. -Droxidopa has been weaned off currently on home regimen midodrine.  - Continue hydrocortisone; decreased to '20mg'$  daily /'10mg'$  qhs as of 11/27 -Plan to ultimately taper midodrine, dose initially increased to '20mg'$  tid frrom '15mg'$  tid per PCCM on 11/27. Now on midodrine to '10mg'$  TID per cardiology -Systolic  blood pressures currently soft.  -Dr. Wyline Copas discussed with Critical Care who is familiar with patient's history. Pt historically has chronically low bp with sbp as low as 70-80's. Per Critical Care, recommendation to wean droxidopa to off, which  has been weaned off. -Continue empiric IV antibiotics. -Follow.   Acute on chronic HFrEF, ICM  CAD with stent in 05/2022 > 09/03/22 TTE> EF now 30-35, G2DD  pAF -Concerns for mixed cardiogenic shock during the hospitalization. - Cardiology consulted; not a candidate for home inotropes, revascularization, transplant or LVAD - Unable to tolerate GDMT due to persistent hypotension -Continue Eliquis, Plavix.   -Cardiology following intermittently and patient unable to be started on GDMT while on midodrine and droxidopa.   -Droxidopa has been weaned off.   -Patient with some complaints of shortness of breath, noted to have 1+ lower extremity edema, and scrotal swelling, 09/26/2022. -BP soft and as such patient given a dose of IV albumin followed by Lasix 20 mg IV x 1 with urine output of 1.950 L. -Patient with urine output of 1.075 L over the past 24 hours.  -Patient with some scrotal swelling as well, will give another dose of IV albumin and Lasix 20 mg IV x 1.  -Strict I's and O's. -Cardiology has signed off.   -Outpatient follow-up with primary cardiologist.     Prostatitis Scrotal Cellulitis H/O ESBL E.Coli and CRE Proteus - ID consulted, appreciate recs -Last seen by ID on 09/05/2022.  Plan is for ertapenem 1 g daily for 4 weeks, end date 09/29/2022 - PICC in place  - Outpatient ID f/u, established in [place]   Hematuria due to UTI Known prostate cancer, under surveillance with urology - Trend H&H - Keep Foley until cleared by Urology post-discharge - Continue Eliquis/Plavix, monitor closely in the setting of hematuria - Continue Flomax, Proscar -Will need outpatient follow-up with urology likely will be discharged with Foley catheter in place until outpatient follow-up. -Patient still with some hematuria noted on anticoagulation with Eliquis and Plavix. -Patient frustrated and upset that he has not been seen by urologist since Foley catheter was placed and demanding to be seen  by urologist as he has further questions as to when Foley catheter can be removed.   Hyponatremia, hypovolemic Mild AKI - improved Hypocalcemia  -Electrolytes improved, sodium at 138, potassium at 3.6.   -Creatinine down to 0.80.   -Avoid nephrotoxic agents.   DM2 -Hemoglobin A1c 6.6 (08/03/2022). -CBG 145 this morning.  -Continue SSI.     Deconditioning - PT/OT while in-house; recommended for SNF -Patient noted to be refusing PT therapies early on in the hospitalization. -Has been assessed by PT who are recommending SNF. - Utilize prosthesis as able - patient does NOT want to return to prior SNF [facility]). TOC following and looking for a different SNF for patient.   Chronic Stage 2 sacral ulcer, POA - WOC   Insomnia, phantom limb pain LLE -Continue reduced dose gabapentin, melatonin, trazodone, Ambien nightly as needed.  - Ortho tech consult for LLE sleeve which has been placed.   Chronic Critical Illness  - PMT consulted, appreciate recommendations - Patient acknowledges medical measures have not changed QOL - Continues to desire current scope of care - DNR code status       DVT prophylaxis: Eliquis Code Status: DNR Family Communication: Updated patient.  No family at bedside. Disposition: Medically stable.  Awaiting SNF  Status is: Inpatient Remains inpatient appropriate because: Severitiy of illness   Consultants:  Urology: Dr  Bell 08/31/2022 PCCM: Dr Patsey Berthold 09/01/2022 Wound care: Julien Girt, RN 09/02/2022 ID: Dr. Juleen China 09/03/2022 Palliaitve Care: Dr Rowe Pavy 09/03/2022 Cardiology: Dr Gasper Sells 09/04/2022  Procedures:  CT renal stone protocol 08/31/2022 CT angiogram chest 09/02/2022 Chest x-ray 09/02/2022 2D echo 09/03/2022 Bilateral lower extremity Dopplers 09/02/2022  Antimicrobials:  Anti-infectives (From admission, onward)    Start     Dose/Rate Route Frequency Ordered Stop   09/05/22 1600  ertapenem (INVANZ) 1,000 mg in sodium chloride  0.9 % 100 mL IVPB        1 g 200 mL/hr over 30 Minutes Intravenous Every 24 hours 09/05/22 1357 09/29/22 2359   09/03/22 0800  meropenem (MERREM) 1 g in sodium chloride 0.9 % 100 mL IVPB  Status:  Discontinued        1 g 200 mL/hr over 30 Minutes Intravenous Every 8 hours 09/03/22 0201 09/05/22 1357   09/01/22 1100  meropenem (MERREM) 1 g in sodium chloride 0.9 % 100 mL IVPB  Status:  Discontinued        1 g 200 mL/hr over 30 Minutes Intravenous Every 12 hours 09/01/22 0416 09/03/22 0201   09/01/22 1100  fluconazole (DIFLUCAN) IVPB 200 mg  Status:  Discontinued        200 mg 100 mL/hr over 60 Minutes Intravenous Every 24 hours 09/01/22 1020 09/04/22 1309   09/01/22 0600  meropenem (MERREM) 1 g in sodium chloride 0.9 % 100 mL IVPB  Status:  Discontinued        1 g 200 mL/hr over 30 Minutes Intravenous Every 8 hours 08/31/22 2136 09/01/22 0416   08/31/22 2200  sulfamethoxazole-trimethoprim (BACTRIM DS) 800-160 MG per tablet 1 tablet  Status:  Discontinued        1 tablet Oral 2 times daily 08/31/22 2011 08/31/22 2129   08/31/22 2200  amoxicillin (AMOXIL) capsule 500 mg  Status:  Discontinued        500 mg Oral Every 8 hours 08/31/22 2131 09/03/22 1417   08/31/22 2145  meropenem (MERREM) 1 g in sodium chloride 0.9 % 100 mL IVPB        1 g 200 mL/hr over 30 Minutes Intravenous  Once 08/31/22 2136 09/01/22 0820   08/31/22 1815  cefTRIAXone (ROCEPHIN) 1 g in sodium chloride 0.9 % 100 mL IVPB        1 g 200 mL/hr over 30 Minutes Intravenous  Once 08/31/22 1814 08/31/22 1911         Subjective: Sitting up in bed.  Wanting Foley catheter to be removed prior to going to SNF.  States he can go to SNF with a Foley catheter in place.  States has not seen a urologist since Foley catheter was placed, upset and wanting to see and speak with the urologist as to when this Foley catheter can be removed and further management.  States he was able to void until Foley catheter was placed.  Some hematuria  noted in Foley bag.    Objective: Vitals:   09/27/22 2109 09/28/22 0500 09/28/22 0507 09/28/22 1137  BP: 119/83  (!) 131/96 (!) 99/48  Pulse: 65  65 68  Resp: (!) 21   16  Temp: 97.7 F (36.5 C)  97.7 F (36.5 C) 98 F (36.7 C)  TempSrc: Oral  Oral Oral  SpO2: 100%  100% 100%  Weight:  117.1 kg    Height:        Intake/Output Summary (Last 24 hours) at 09/28/2022 1206 Last data filed at 09/28/2022 1000  Gross per 24 hour  Intake 1601.06 ml  Output 1075 ml  Net 526.06 ml    Filed Weights   09/26/22 0500 09/27/22 0436 09/28/22 0500  Weight: 123.9 kg 117.6 kg 117.1 kg    Examination:  General exam: NAD. Respiratory system: Lungs clear to auscultation bilaterally.  No wheezes, no crackles, no rhonchi.  Fair air movement.  Speaking in full sentences.  Cardiovascular system: Regular rate rhythm no murmurs rubs or gallops.  No JVD.  Trace to 1+ right lower extremity edema.   Gastrointestinal system: Abdomen is soft, nontender, nondistended, positive bowel sounds.  No rebound.  No guarding.  Central nervous system: Alert and oriented. No focal neurological deficits. Extremities: Trace to 1+ right lower extremity edema.  Status post left BKA.   Scrotum:: Scrotal swelling. Skin: No rashes, lesions or ulcers Psychiatry: Judgement and insight appear normal. Mood & affect appropriate.     Data Reviewed: I have personally reviewed following labs and imaging studies  CBC: Recent Labs  Lab 09/24/22 0240 09/25/22 0337 09/26/22 0354 09/27/22 0329 09/28/22 0318  WBC 6.0 5.6 5.7 5.6 6.2  HGB 7.5* 7.4* 7.3* 7.4* 7.4*  HCT 25.3* 25.0* 25.1* 24.7* 24.9*  MCV 79.8* 79.4* 79.9* 78.9* 78.5*  PLT 114* 104* 96* 90* 85*     Basic Metabolic Panel: Recent Labs  Lab 09/24/22 0240 09/25/22 0337 09/26/22 0354 09/27/22 0329 09/28/22 0318  NA 137 138 138 135 138  K 3.7 3.7 3.7 3.6 3.6  CL 110 110 110 107 111  CO2 22 21* 21* 21* 23  GLUCOSE 193* 157* 155* 162* 173*  BUN 32* 30*  29* 28* 30*  CREATININE 0.74 0.73 0.83 0.78 0.80  CALCIUM 7.8* 7.8* 7.8* 7.8* 8.1*     GFR: Estimated Creatinine Clearance: 128.8 mL/min (by C-G formula based on SCr of 0.8 mg/dL).  Liver Function Tests: Recent Labs  Lab 09/22/22 0034 09/23/22 0208 09/24/22 0240 09/25/22 0337  AST 16 14* 13* 13*  ALT '23 18 15 17  '$ ALKPHOS 78 71 73 71  BILITOT 0.5 0.3 0.5 0.5  PROT 4.9* 4.6* 4.8* 4.7*  ALBUMIN 2.4* 2.2* 2.4* 2.4*     CBG: Recent Labs  Lab 09/27/22 1147 09/27/22 1638 09/27/22 2106 09/28/22 0729 09/28/22 1134  GLUCAP 129* 175* 176* 145* 144*      No results found for this or any previous visit (from the past 240 hour(s)).       Radiology Studies: No results found.      Scheduled Meds:  acetaminophen  1,000 mg Oral Q8H   apixaban  5 mg Oral BID   atorvastatin  80 mg Oral Daily   Chlorhexidine Gluconate Cloth  6 each Topical Daily   clopidogrel  75 mg Oral Daily   finasteride  5 mg Oral Daily   gabapentin  300 mg Oral q1800   gabapentin  300 mg Oral Daily   hydrocortisone  20 mg Oral Daily   And   hydrocortisone  10 mg Oral Daily   insulin aspart  0-5 Units Subcutaneous QHS   insulin aspart  0-9 Units Subcutaneous TID WC   lidocaine  1 patch Transdermal Daily   melatonin  5 mg Oral q1800   midodrine  10 mg Oral TID with meals   nystatin   Topical BID   pantoprazole  40 mg Oral Daily   sodium chloride flush  10-40 mL Intracatheter Q12H   tamsulosin  0.4 mg Oral Daily   traZODone  50 mg Oral  q1800   zolpidem  5 mg Oral q1800   Continuous Infusions:  sodium chloride 250 mL (09/14/22 0248)   sodium chloride 10 mL/hr at 09/24/22 1019   ertapenem 1,000 mg (09/27/22 1614)     LOS: 27 days    Time spent: [place]    Irine Seal, MD [facility]   To contact the attending provider between 7A-7P or the covering provider during after hours 7P-7A, please log into the web site [email or web address] and access using universal [place]  password for that web site. If you do not have the password, please call the hospital operator.  09/28/2022, 12:06 PM

## 2022-09-28 NOTE — Plan of Care (Signed)
  Problem: Coping: Goal: Ability to adjust to condition or change in health will improve Outcome: Progressing   Problem: Clinical Measurements: Goal: Will remain free from infection Outcome: Progressing   Problem: Coping: Goal: Level of anxiety will decrease Outcome: Progressing   Problem: Elimination: Goal: Will not experience complications related to urinary retention Outcome: Progressing

## 2022-09-29 DIAGNOSIS — R310 Gross hematuria: Secondary | ICD-10-CM | POA: Diagnosis not present

## 2022-09-29 DIAGNOSIS — N179 Acute kidney failure, unspecified: Secondary | ICD-10-CM | POA: Diagnosis not present

## 2022-09-29 DIAGNOSIS — I480 Paroxysmal atrial fibrillation: Secondary | ICD-10-CM | POA: Diagnosis not present

## 2022-09-29 DIAGNOSIS — N1330 Unspecified hydronephrosis: Secondary | ICD-10-CM | POA: Diagnosis not present

## 2022-09-29 LAB — CBC
HCT: 25 % — ABNORMAL LOW (ref 39.0–52.0)
Hemoglobin: 7.4 g/dL — ABNORMAL LOW (ref 13.0–17.0)
MCH: 23.1 pg — ABNORMAL LOW (ref 26.0–34.0)
MCHC: 29.6 g/dL — ABNORMAL LOW (ref 30.0–36.0)
MCV: 78.1 fL — ABNORMAL LOW (ref 80.0–100.0)
Platelets: 80 10*3/uL — ABNORMAL LOW (ref 150–400)
RBC: 3.2 MIL/uL — ABNORMAL LOW (ref 4.22–5.81)
RDW: 17.9 % — ABNORMAL HIGH (ref 11.5–15.5)
WBC: 7.2 10*3/uL (ref 4.0–10.5)
nRBC: 0 % (ref 0.0–0.2)

## 2022-09-29 LAB — BASIC METABOLIC PANEL
Anion gap: 6 (ref 5–15)
BUN: 31 mg/dL — ABNORMAL HIGH (ref 8–23)
CO2: 22 mmol/L (ref 22–32)
Calcium: 8.2 mg/dL — ABNORMAL LOW (ref 8.9–10.3)
Chloride: 111 mmol/L (ref 98–111)
Creatinine, Ser: 0.81 mg/dL (ref 0.61–1.24)
GFR, Estimated: >60 mL/min (ref >60)
Glucose, Bld: 199 mg/dL — ABNORMAL HIGH (ref 70–99)
Potassium: 3.8 mmol/L (ref 3.5–5.1)
Sodium: 139 mmol/L (ref 135–145)

## 2022-09-29 LAB — GLUCOSE, CAPILLARY
Glucose-Capillary: 128 mg/dL — ABNORMAL HIGH (ref 70–99)
Glucose-Capillary: 135 mg/dL — ABNORMAL HIGH (ref 70–99)
Glucose-Capillary: 181 mg/dL — ABNORMAL HIGH (ref 70–99)
Glucose-Capillary: 208 mg/dL — ABNORMAL HIGH (ref 70–99)

## 2022-09-29 LAB — HEMOGLOBIN AND HEMATOCRIT, BLOOD
HCT: 24.6 % — ABNORMAL LOW (ref 39.0–52.0)
Hemoglobin: 7.3 g/dL — ABNORMAL LOW (ref 13.0–17.0)

## 2022-09-29 MED ORDER — ALBUMIN HUMAN 25 % IV SOLN
25.0000 g | Freq: Once | INTRAVENOUS | Status: AC
  Administered 2022-09-29: 25 g via INTRAVENOUS
  Filled 2022-09-29: qty 100

## 2022-09-29 NOTE — TOC Progression Note (Signed)
Transition of Care [facility]) - Progression Note    Patient Details  Name: Tonatiuh Mallon MRN: [id number] Date of Birth: 1956-09-07  [facility] of [facility] CM/SW Contact  Pharell Rolfson, Juliann Pulse, RN Phone Number: 09/29/2022, 2:23 PM  Clinical Narrative: Patient & friend [name]-have not chosen a ST SNF from list for several days. They are waiting to hear when he will be d/c-need team effort to inform patient of needing to choose the facility before he hears discharge-also he is not participating in therapy.MD updated.      Expected Discharge Plan: [place] Barriers to Discharge: Continued Medical Work up  Expected Discharge Plan and Services Expected Discharge Plan: [place]   Discharge Planning Services: CM Consult   Living arrangements for the past 2 months: Single Family Home                                       Social Determinants of Health (SDOH) Interventions Transportation Interventions: Inpatient TOC Utilities Interventions: Inpatient TOC  Readmission Risk Interventions    09/02/2022   10:36 AM  Readmission Risk Prevention Plan  Transportation Screening Complete  Medication Review (RN Care Manager) Complete  PCP or Specialist appointment within 3-5 days of discharge Complete  HRI or Home Care Consult Complete  SW Recovery Care/Counseling Consult Complete  Palliative Care Screening Not [place] Complete
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
## 2022-09-29 NOTE — Plan of Care (Signed)
  Problem: Education: Goal: Ability to describe self-care measures that may prevent or decrease complications (Diabetes Survival Skills Education) will improve Outcome: Progressing   Problem: Clinical Measurements: Goal: Will remain free from infection Outcome: Progressing   Problem: Safety: Goal: Ability to remain free from injury will improve Outcome: Progressing

## 2022-09-29 NOTE — Progress Notes (Signed)
Patient ID: Peter Yang, male   DOB: Jul 21, 1956, 66 y.o.   MRN: [id number]  I was called by Dr. Grandville Silos as Mr. Saiz had multiple questions about his urologic situation and care.  I saw Mr. Kutzer this morning.  He has an indwelling catheter that has been in place since 09/01/22.  He has had ongoing intermittent hematuria since then.  He is anticoagulated.  He will complete his course of antibiotic/antifungal therapy today.  He is planning on going to a SNF but has concerns about his ability to participate in physical therapy and rehab with his catheter.  He was to follow up with Dr. Gloriann Loan for outpatient evaluation and cystoscopy to further evaluate his hematuria but has remained hospitalized due to his complex medical situation.  Catheter currently has pinkish urine that is draining well without acute issues.  I told Mr. Lada that I would notify Dr. Gloriann Loan of his situation and his concerns so that he could address them tomorrow regarding if he needs to continue the catheter, have it exchanged, etc.

## 2022-09-30 DIAGNOSIS — R310 Gross hematuria: Secondary | ICD-10-CM | POA: Diagnosis not present

## 2022-09-30 DIAGNOSIS — N179 Acute kidney failure, unspecified: Secondary | ICD-10-CM | POA: Diagnosis not present

## 2022-09-30 DIAGNOSIS — N1330 Unspecified hydronephrosis: Secondary | ICD-10-CM | POA: Diagnosis not present

## 2022-09-30 DIAGNOSIS — I480 Paroxysmal atrial fibrillation: Secondary | ICD-10-CM | POA: Diagnosis not present

## 2022-09-30 LAB — CBC
HCT: 24.3 % — ABNORMAL LOW (ref 39.0–52.0)
Hemoglobin: 7.1 g/dL — ABNORMAL LOW (ref 13.0–17.0)
MCH: 22.8 pg — ABNORMAL LOW (ref 26.0–34.0)
MCHC: 29.2 g/dL — ABNORMAL LOW (ref 30.0–36.0)
MCV: 77.9 fL — ABNORMAL LOW (ref 80.0–100.0)
Platelets: 72 10*3/uL — ABNORMAL LOW (ref 150–400)
RBC: 3.12 MIL/uL — ABNORMAL LOW (ref 4.22–5.81)
RDW: 18.1 % — ABNORMAL HIGH (ref 11.5–15.5)
WBC: 5 10*3/uL (ref 4.0–10.5)
nRBC: 0 % (ref 0.0–0.2)

## 2022-09-30 LAB — IRON AND TIBC
Iron: 16 ug/dL — ABNORMAL LOW (ref 45–182)
Saturation Ratios: 5 % — ABNORMAL LOW (ref 17.9–39.5)
TIBC: 349 ug/dL (ref 250–450)
UIBC: 333 ug/dL

## 2022-09-30 LAB — BASIC METABOLIC PANEL
Anion gap: 6 (ref 5–15)
BUN: 30 mg/dL — ABNORMAL HIGH (ref 8–23)
CO2: 22 mmol/L (ref 22–32)
Calcium: 8.2 mg/dL — ABNORMAL LOW (ref 8.9–10.3)
Chloride: 110 mmol/L (ref 98–111)
Creatinine, Ser: 0.77 mg/dL (ref 0.61–1.24)
GFR, Estimated: >60 mL/min (ref >60)
Glucose, Bld: 161 mg/dL — ABNORMAL HIGH (ref 70–99)
Potassium: 3.9 mmol/L (ref 3.5–5.1)
Sodium: 138 mmol/L (ref 135–145)

## 2022-09-30 LAB — HEMOGLOBIN AND HEMATOCRIT, BLOOD
HCT: 26.7 % — ABNORMAL LOW (ref 39.0–52.0)
Hemoglobin: 7.8 g/dL — ABNORMAL LOW (ref 13.0–17.0)

## 2022-09-30 LAB — GLUCOSE, CAPILLARY
Glucose-Capillary: 120 mg/dL — ABNORMAL HIGH (ref 70–99)
Glucose-Capillary: 115 mg/dL — ABNORMAL HIGH (ref 70–99)
Glucose-Capillary: 160 mg/dL — ABNORMAL HIGH (ref 70–99)
Glucose-Capillary: 166 mg/dL — ABNORMAL HIGH (ref 70–99)

## 2022-09-30 LAB — FERRITIN: Ferritin: 10 ng/mL — ABNORMAL LOW (ref 24–336)

## 2022-09-30 LAB — VITAMIN B12: Vitamin B-12: 481 pg/mL (ref 180–914)

## 2022-09-30 LAB — FOLATE: Folate: 7.6 ng/mL (ref >5.9)

## 2022-09-30 NOTE — Progress Notes (Addendum)
Urology Inpatient Progress Report  Gross hematuria [R31.0] AKI (acute kidney injury) ([place]) [N17.9] Hydronephrosis, unspecified hydronephrosis type [N13.30]        Intv/Subj: No acute events overnight. Patient is without complaint. Urine clear in tubing this morning. Wants to try to void. Due for cath exchange  Principal Problem:   Gross hematuria Active Problems:   Diabetic polyneuropathy associated with type 2 diabetes mellitus (HCC)   PAF (paroxysmal atrial fibrillation) (HCC)   Dyslipidemia   Pressure ulcer of sacral region, stage 2 (HCC)   Acute on chronic HFrEF (heart failure with reduced ejection fraction) ([place])   Coronary artery disease due to lipid rich plaque   UTI (urinary tract infection)   Borderline low blood pressure determined by examination   Acute kidney injury ([place])   Lactic acidosis   Cellulitis of scrotum   Acute prostatitis   Septic shock (HCC)   Hyponatremia   Cardiogenic shock ([place])   Physical deconditioning   Palliative care by specialist   Goals of care, counseling/discussion   AKI (acute kidney injury) ([place])   Mood altered   Medication management   Need for emotional support   Pain   High risk medication use   Counseling and coordination of care   Acute on chronic systolic congestive heart failure (HCC)   HFrEF (heart failure with reduced ejection fraction) (HCC)   Orthostatic hypotension   Hydronephrosis  Current Facility-Administered Medications  Medication Dose Route Frequency Provider Last Rate Last Admin   0.9 %  sodium chloride infusion  250 mL Intravenous Continuous Rigoberto Noel, MD 10 mL/hr at 09/14/22 0248 250 mL at 09/14/22 0248   0.9 %  sodium chloride infusion   Intravenous PRN Shawna Clamp, MD 10 mL/hr at 09/24/22 1019 New Bag at 09/24/22 1019   acetaminophen (TYLENOL) tablet 1,000 mg  1,000 mg Oral Q8H Mims, Lauren W, DO   1,000 mg at 09/30/22 0606   acetaminophen (TYLENOL) tablet 650 mg  650 mg Oral Q6H PRN Candee Furbish, MD   650 mg at 09/16/22 0957   apixaban (ELIQUIS) tablet 5 mg  5 mg Oral BID Suzzanne Cloud, RPH   5 mg at 09/29/22 2130   atorvastatin (LIPITOR) tablet 80 mg  80 mg Oral Daily Ollis, Brandi L, NP   80 mg at 09/29/22 1004   Chlorhexidine Gluconate Cloth 2 % PADS 6 each  6 each Topical Daily Etta Quill, DO   6 each at 09/29/22 1027   clopidogrel (PLAVIX) tablet 75 mg  75 mg Oral Daily Barrett, Rhonda G, PA-C   75 mg at 09/29/22 1004   diclofenac Sodium (VOLTAREN) 1 % topical gel 2 g  2 g Topical TID PRN Omar Person, NP   2 g at 09/12/22 0207   finasteride (PROSCAR) tablet 5 mg  5 mg Oral Daily Marton Redwood III, MD   5 mg at 09/29/22 1006   gabapentin (NEURONTIN) capsule 300 mg  300 mg Oral q1800 Mims, Moody Bruins, DO   300 mg at 09/29/22 1803   gabapentin (NEURONTIN) capsule 300 mg  300 mg Oral Daily Mims, Lauren W, DO   300 mg at 09/29/22 1004   hydrocortisone (CORTEF) tablet 20 mg  20 mg Oral Daily Nevada Crane M, PA-C   20 mg at 09/29/22 1004   And   hydrocortisone (CORTEF) tablet 10 mg  10 mg Oral Daily Nevada Crane M, PA-C   10 mg at 09/29/22 1804   insulin aspart (novoLOG)  injection 0-5 Units  0-5 Units Subcutaneous QHS Julian Hy, DO   2 Units at 09/29/22 2223   insulin aspart (novoLOG) injection 0-9 Units  0-9 Units Subcutaneous TID WC Dwyane Dee, MD   2 Units at 09/29/22 1802   lidocaine (LIDODERM) 5 % 1 patch  1 patch Transdermal Daily [name], MD   1 patch at 09/29/22 0428   melatonin tablet 5 mg  5 mg Oral q1800 MimsMoody Bruins, DO   5 mg at 09/29/22 1803   midodrine (PROAMATINE) tablet 10 mg  10 mg Oral TID with meals Fay Records, MD   10 mg at 09/29/22 1803   nystatin (MYCOSTATIN/NYSTOP) topical powder   Topical BID Margaretha Seeds, MD   Given at 09/29/22 2131   Oral care mouth rinse  15 mL Mouth Rinse PRN Etta Quill, DO       oxyCODONE (Oxy IR/ROXICODONE) immediate release tablet 5 mg  5 mg Oral Q4H PRN [name], DO   5  mg at 09/29/22 2213   pantoprazole (PROTONIX) EC tablet 40 mg  40 mg Oral Daily Ollis, Brandi L, NP   40 mg at 09/29/22 1005   polyvinyl alcohol (LIQUIFILM TEARS) 1.4 % ophthalmic solution 1 drop  1 drop Both Eyes PRN [name], MD   1 drop at 09/10/22 2140   sodium chloride flush (NS) 0.9 % injection 10-40 mL  10-40 mL Intracatheter Q12H Shawna Clamp, MD   10 mL at 09/28/22 2140   sodium chloride flush (NS) 0.9 % injection 10-40 mL  10-40 mL Intracatheter PRN Shawna Clamp, MD       tamsulosin [facility]) capsule 0.4 mg  0.4 mg Oral Daily Jennette Kettle M, DO   0.4 mg at 09/29/22 1004   traZODone (DESYREL) tablet 50 mg  50 mg Oral q1800 MimsMoody Bruins, DO   50 mg at 09/29/22 1804   zolpidem (AMBIEN) tablet 5 mg  5 mg Oral q1800 MimsMoody Bruins, DO   5 mg at 09/29/22 1804     Objective: Vital: Vitals:   09/29/22 0800 09/29/22 1235 09/29/22 2153 09/30/22 0700  BP: (!) '94/57 98/62 97/61 '$ 96/60  Pulse: 68  71 69  Resp:   17 15  Temp:  98 F (36.7 C) 98.1 F (36.7 C) 98 F (36.7 C)  TempSrc:  Oral Oral Oral  SpO2:  100% 99% 100%  Weight:    121.3 kg  Height:       I/Os: I/O last 3 completed shifts: In: 1609.1 [P.O.:1270; Other:40; IV Piggyback:299.1] Out: 2000 [Urine:2000]  Physical Exam:  General: Patient is in no apparent distress Foley: amber with sediment in tubing    Lab Results: Recent Labs    09/28/22 0318 09/29/22 0047 09/29/22 1035 09/30/22 0225  WBC 6.2  --  7.2 5.0  HGB 7.4* 7.3* 7.4* 7.1*  HCT 24.9* 24.6* 25.0* 24.3*   Recent Labs    09/28/22 0318 09/29/22 0047 09/30/22 0225  NA 138 139 138  K 3.6 3.8 3.9  CL 111 111 110  CO2 '23 22 22  '$ GLUCOSE 173* 199* 161*  BUN 30* 31* 30*  CREATININE 0.80 0.81 0.77  CALCIUM 8.1* 8.2* 8.2*   No results for input(s): "LABPT", "INR" in the last 72 hours. No results for input(s): "LABURIN" in the last 72 hours. Results for orders placed or performed during the hospital encounter of 08/31/22  Urine Culture      Status: None  Collection Time: 08/31/22  4:22 PM   Specimen: Urine, Clean Catch  Result Value Ref Range Status   Specimen Description   Final    URINE, CLEAN CATCH Performed at [facility], [place] [address], [place], [place] [address]    Special Requests   Final    NONE Performed at [facility], [place] [address], [place], [place] [address]    Culture   Final    NO GROWTH Performed at [facility], [place] [address], [place], [place] [address]    Report Status 09/02/2022 FINAL  Final  Blood culture (routine x 2)     Status: None   Collection Time: 08/31/22  6:15 PM   Specimen: BLOOD  Result Value Ref Range Status   Specimen Description   Final    BLOOD SITE NOT SPECIFIED Performed at [place] [address], [place], [place] [address]    Special Requests   Final    BOTTLES DRAWN AEROBIC AND ANAEROBIC Blood Culture adequate volume Performed at [place] [address], [place], [place] [address]    Culture   Final    NO GROWTH 5 DAYS Performed at [facility], [place] [address], [place], [place] [address]    Report Status 09/05/2022 FINAL  Final  Blood culture (routine x 2)     Status: None   Collection Time: 08/31/22  6:30 PM   Specimen: BLOOD  Result Value Ref Range Status   Specimen Description   Final    BLOOD SITE NOT SPECIFIED Performed at [place] [address], [place], [place] [address]    Special Requests   Final    BOTTLES DRAWN AEROBIC AND ANAEROBIC Blood Culture results may not be optimal due to an inadequate volume of blood received in culture bottles Performed at [place] [address], [place], [place] [address]    Culture   Final    NO GROWTH 5 DAYS Performed at [facility], [place] [address] [place], [place] [address]    Report Status 09/06/2022 FINAL  Final  MRSA Next Gen by PCR, Nasal     Status: None    Collection Time: 09/01/22 12:00 AM   Specimen: Nasal Mucosa; Nasal Swab  Result Value Ref Range Status   MRSA by PCR Next Gen NOT DETECTED NOT DETECTED Final    Comment: (NOTE) The GeneXpert MRSA Assay (FDA approved for NASAL specimens only), is one component of a comprehensive MRSA colonization surveillance program. It is not intended to diagnose MRSA infection nor to guide or monitor treatment for MRSA infections. Test performance is not FDA approved in patients less than 51 years old. Performed at [facility], [place] [address] [place], [place] [address]     Studies/Results: No results found.  Assessment: Urinary retention Gross hematuria  Plan: Dc foley catheter. If unable to void, OK for nursing to replace with 40F coude.  Recommend outpatient cystoscopy for hematuria workup  Addendum: Patient refused removal this am and wants to have it removed tomorrow am. I told nursing that is fine   Link Snuffer, MD Urology 09/30/2022, 7:44 AM

## 2022-09-30 NOTE — Progress Notes (Signed)
Patient stated he does not want foley removed until tomorrow morning; Doctor verified and okayed for foley to be removed in the morning

## 2022-09-30 NOTE — Progress Notes (Signed)
Voicemail left for pt's friend [name] requesting return call re SNF choice. Request sent to facilities who have extended bed offer to confirm offer still valid given initial referral was sent out on 11/30. SW will provide updates as available.   Wandra Feinstein, MSW, LCSW [phone] (coverage)

## 2022-09-30 NOTE — Progress Notes (Signed)
PROGRESS NOTE    Peter Yang  HYW:[id number] DOB: 1955-12-13 DOA: 08/31/2022 PCP: Clovia Cuff, MD    Chief Complaint  Patient presents with   Hematuria    Brief Narrative:  66 year old man who presented to the [facility] ED 11/11 with a chief complaint of hematuria. PMHx significant for HTN, HLD, HFrEF/ICM EF 40-45%, CAD/NSTEMI, TIA, PAF (on Eliquis), T2DM, BPH (hx prostate CA).   Patient with questionable length of time of hematuria which may have been going on since 10/13 per patient.  Sent to the emergency department by SNF staff on 11/11.   At the emergency department he developed hypotension with a BP 83/63. WBC 14.2, LA 5.0 > 7.9. Code Sepsis was called. Blood cultures and urine cultures were obtained. Patient was given fluid bolus per sepsis guidelines.  Patient with complicated infectious history-started on meropenem and amoxicillin by TRH.  Urology was consulted for hematuria and concern for Fournier's.  Recommends monitoring with possible cystoscopy once Eliquis washout. [facility] admitted patient.   11/11 Admit by TRH, BC>, UC>, PCCM consult, Mero and amoxicillin> 11/12 Started on peripheral Levophed for persistent hypotension, albumin 11/13 Troponin rising from 263 to 771, DVT/PE ruled out 11/14 dobutamine started for low coox, lower EF on echo 11/15 trial of restarting heparin 11/17 off dobutamine 11/19 coox 93, remains on pressors 11/20 Coox 67, CVP 8, on 3 norepi 11/21 Coox 75.1, 4 norepi  11/23 Norepi weaned to 67mg, hypotension into 80's overnight > increased levo. Robust UOP despite no lasix in 72 hours.   11/24 Levophed weaned off briefly yesterday and restarted overnight 11/26 Patient responsive to IVF resuscitation. Initial urine studies suggest hypovolemic hyponatremia 11/27 Ongoing mild hypotension, goal MAP 60. Remains on NE 270m. Midodrine increased, droxidopa added. Mild persistent hematuria with clots.       Assessment & Plan:   Principal Problem:   Gross  hematuria Active Problems:   Acute kidney injury ([place]  Coronary artery disease due to lipid rich plaque   UTI (urinary tract infection)   Borderline low blood pressure determined by examination   Lactic acidosis   Diabetic polyneuropathy associated with type 2 diabetes mellitus (HCC)   PAF (paroxysmal atrial fibrillation) (HCC)   Acute on chronic HFrEF (heart failure with reduced ejection fraction) (HCC)   Pressure ulcer of sacral region, stage 2 (HCC)   Dyslipidemia   Cellulitis of scrotum   Acute prostatitis   Septic shock (HCC)   Hyponatremia   Cardiogenic shock (HCC)   Physical deconditioning   Palliative care by specialist   Goals of care, counseling/discussion   AKI (acute kidney injury) ([place]  Mood altered   Medication management   Need for emotional support   Pain   High risk medication use   Counseling and coordination of care   Acute on chronic systolic congestive heart failure (HCC)   HFrEF (heart failure with reduced ejection fraction) (HCC)   Orthostatic hypotension   Hydronephrosis  Shock, mixed septic from UTI >cardiogenic shock with new depressed EF>hypovolemic shock d/t overdiuresis Hypovolemic hyponatremia Clinically resolved infection and on appropriate antibiotics. Echo with EF 30% with concerns for cardiogenic shock however patient net negative volume. -Cardiology following intermittently while on any diuretics. -Droxidopa has been weaned off currently on home regimen midodrine.  - Continue hydrocortisone; decreased to '20mg'$  daily /'10mg'$  qhs as of 11/27 -Plan to ultimately taper midodrine, dose initially increased to '20mg'$  tid frrom '15mg'$  tid per PCCM on 11/27. Now on midodrine to '10mg'$  TID per cardiology -Systolic  blood pressures currently soft.  -Dr. Wyline Copas discussed with Critical Care who is familiar with patient's history. Pt historically has chronically low bp with sbp as low as 70-80's. Per Critical Care, recommendation to wean droxidopa to off, which  has been weaned off. -Status post full course of empiric IV antibiotics of ertapenem.  -Follow.   Acute on chronic HFrEF, ICM  CAD with stent in 05/2022 > 09/03/22 TTE> EF now 30-35, G2DD  pAF -Concerns for mixed cardiogenic shock during the hospitalization. - Cardiology consulted; not a candidate for home inotropes, revascularization, transplant or LVAD - Unable to tolerate GDMT due to persistent hypotension -Continue Eliquis, Plavix.   -Cardiology following intermittently and patient unable to be started on GDMT while on midodrine and droxidopa.   -Droxidopa has been weaned off.   -Patient with some complaints of shortness of breath, noted to have 1+ lower extremity edema, and scrotal swelling, 09/26/2022. -BP soft and as such patient given a dose of IV albumin followed by Lasix 20 mg IV x 1 with urine output of 1.950 L. -Patient with urine output of 1.350 L over the past 24 hours.  -Patient with some scrotal swelling as well, and received IV albumin and Lasix 20 mg IV x 1 on 09/28/2022 with good urine output. -Patient is -13.4 L during this hospitalization.    -Strict I's and O's. -Cardiology has signed off.   -Outpatient follow-up with primary cardiologist.     Prostatitis Scrotal Cellulitis H/O ESBL E.Coli and CRE Proteus - ID consulted, appreciate recs -Last seen by ID on 09/05/2022.  Plan is for ertapenem 1 g daily for 4 weeks, end date 09/29/2022 - PICC in place  -Status post full course antibiotics of ertapenem.   - Outpatient ID f/u, established in [place]   Hematuria due to UTI Known prostate cancer, under surveillance with urology - Trend H&H - Keep Foley until cleared by Urology post-discharge - Continue Eliquis/Plavix, monitor closely in the setting of hematuria - Continue Flomax, Proscar -Will need outpatient follow-up with urology likely will be discharged with Foley catheter in place until outpatient follow-up. -Patient still with some hematuria noted on  anticoagulation with Eliquis and Plavix. -Hematuria improved.. -Patient frustrated and upset that he has not been seen by urologist since Foley catheter was placed and demanding to be seen by urologist as he has further questions as to when Foley catheter can be removed. -Seen by Dr. Alinda Money briefly on 09/29/2022. -Seen by Dr. Gloriann Loan, urology today who discussed with patient with his concern for Foley catheter removal and voiding trial was ordered however patient noted to have refused removal of Foley catheter today and wants it removed tomorrow. -Urology recommended outpatient cystoscopy for further hematuria workup.   Hyponatremia, hypovolemic Mild AKI - improved Hypocalcemia  -Electrolytes improved, sodium at 138, potassium at 3.9.   -Creatinine down to 0.77.   -Avoid nephrotoxic agents.   DM2 -Hemoglobin A1c 6.6 (08/03/2022). -CBG 120 this morning.  -Continue SSI.     Deconditioning - PT/OT while in-house; recommended for SNF -Patient noted to be refusing PT therapies early on in the hospitalization. -Has been reassessed by PT who are recommending SNF. - Utilize prosthesis as able - patient does NOT want to return to prior SNF [facility]). TOC following and looking for a different SNF for patient.   Chronic Stage 2 sacral ulcer, POA - WOC   Insomnia, phantom limb pain LLE -Continue reduced dose gabapentin, melatonin, trazodone, Ambien nightly as needed.  - Ortho tech consult  for LLE sleeve which has been placed.   Chronic Critical Illness  - PMT consulted, appreciate recommendations - Patient acknowledges medical measures have not changed QOL - Continues to desire current scope of care - DNR code status       DVT prophylaxis: Eliquis Code Status: DNR Family Communication: Updated patient.  No family at bedside. Disposition: Medically stable.  Awaiting SNF  Status is: Inpatient Remains inpatient appropriate because: Severitiy of illness   Consultants:  Urology: Dr  Gloriann Loan 08/31/2022 PCCM: Dr Patsey Berthold 09/01/2022 Wound care: Julien Girt, RN 09/02/2022 ID: Dr. Juleen China 09/03/2022 Palliaitve Care: Dr Rowe Pavy 09/03/2022 Cardiology: Dr Gasper Sells 09/04/2022  Procedures:  CT renal stone protocol 08/31/2022 CT angiogram chest 09/02/2022 Chest x-ray 09/02/2022 2D echo 09/03/2022 Bilateral lower extremity Dopplers 09/02/2022  Antimicrobials:  Anti-infectives (From admission, onward)    Start     Dose/Rate Route Frequency Ordered Stop   09/05/22 1600  ertapenem (INVANZ) 1,000 mg in sodium chloride 0.9 % 100 mL IVPB        1 g 200 mL/hr over 30 Minutes Intravenous Every 24 hours 09/05/22 1357 09/30/22 0601   09/03/22 0800  meropenem (MERREM) 1 g in sodium chloride 0.9 % 100 mL IVPB  Status:  Discontinued        1 g 200 mL/hr over 30 Minutes Intravenous Every 8 hours 09/03/22 0201 09/05/22 1357   09/01/22 1100  meropenem (MERREM) 1 g in sodium chloride 0.9 % 100 mL IVPB  Status:  Discontinued        1 g 200 mL/hr over 30 Minutes Intravenous Every 12 hours 09/01/22 0416 09/03/22 0201   09/01/22 1100  fluconazole (DIFLUCAN) IVPB 200 mg  Status:  Discontinued        200 mg 100 mL/hr over 60 Minutes Intravenous Every 24 hours 09/01/22 1020 09/04/22 1309   09/01/22 0600  meropenem (MERREM) 1 g in sodium chloride 0.9 % 100 mL IVPB  Status:  Discontinued        1 g 200 mL/hr over 30 Minutes Intravenous Every 8 hours 08/31/22 2136 09/01/22 0416   08/31/22 2200  sulfamethoxazole-trimethoprim (BACTRIM DS) 800-160 MG per tablet 1 tablet  Status:  Discontinued        1 tablet Oral 2 times daily 08/31/22 2011 08/31/22 2129   08/31/22 2200  amoxicillin (AMOXIL) capsule 500 mg  Status:  Discontinued        500 mg Oral Every 8 hours 08/31/22 2131 09/03/22 1417   08/31/22 2145  meropenem (MERREM) 1 g in sodium chloride 0.9 % 100 mL IVPB        1 g 200 mL/hr over 30 Minutes Intravenous  Once 08/31/22 2136 09/01/22 0820   08/31/22 1815  cefTRIAXone (ROCEPHIN) 1 g in  sodium chloride 0.9 % 100 mL IVPB        1 g 200 mL/hr over 30 Minutes Intravenous  Once 08/31/22 1814 08/31/22 1911         Subjective: Sitting up in bed.  Stated he saw urologist today and they have decided to do a voiding trial tomorrow.  No chest pain.  No shortness of breath.     Objective: Vitals:   09/29/22 1235 09/29/22 2153 09/30/22 0700 09/30/22 1246  BP: '98/62 97/61 96/60 '$ (!) 86/57  Pulse:  71 69 74  Resp:  '17 15 18  '$ Temp: 98 F (36.7 C) 98.1 F (36.7 C) 98 F (36.7 C) 98.3 F (36.8 C)  TempSrc: Oral Oral Oral Oral  SpO2: 100% 99%  100% 97%  Weight:   121.3 kg   Height:        Intake/Output Summary (Last 24 hours) at 09/30/2022 1302 Last data filed at 09/30/2022 1142 Gross per 24 hour  Intake 1569.06 ml  Output 1350 ml  Net 219.06 ml    Filed Weights   09/27/22 0436 09/28/22 0500 09/30/22 0700  Weight: 117.6 kg 117.1 kg 121.3 kg    Examination:  General exam: NAD. Respiratory system: Clear to auscultation bilaterally.  No wheezes, no crackles, no rhonchi.  Fair air movement.  Speaking in full sentences.   Cardiovascular system: Regular rate rhythm no murmurs rubs or gallops.  No JVD.  Trace right lower extremity edema.   Gastrointestinal system: Abdomen is soft, nontender, nondistended, positive bowel sounds.  No rebound.  No guarding.  Central nervous system: Alert and oriented. No focal neurological deficits. Extremities: Trace right lower extremity edema.  Status post left BKA.   Scrotum:: Scrotal swelling slightly improving.  Skin: No rashes, lesions or ulcers Psychiatry: Judgement and insight appear normal. Mood & affect appropriate.     Data Reviewed: I have personally reviewed following labs and imaging studies  CBC: Recent Labs  Lab 09/26/22 0354 09/27/22 0329 09/28/22 0318 09/29/22 0047 09/29/22 1035 09/30/22 0225  WBC 5.7 5.6 6.2  --  7.2 5.0  HGB 7.3* 7.4* 7.4* 7.3* 7.4* 7.1*  HCT 25.1* 24.7* 24.9* 24.6* 25.0* 24.3*  MCV 79.9*  78.9* 78.5*  --  78.1* 77.9*  PLT 96* 90* 85*  --  80* 72*     Basic Metabolic Panel: Recent Labs  Lab 09/26/22 0354 09/27/22 0329 09/28/22 0318 09/29/22 0047 09/30/22 0225  NA 138 135 138 139 138  K 3.7 3.6 3.6 3.8 3.9  CL 110 107 111 111 110  CO2 21* 21* '23 22 22  '$ GLUCOSE 155* 162* 173* 199* 161*  BUN 29* 28* 30* 31* 30*  CREATININE 0.83 0.78 0.80 0.81 0.77  CALCIUM 7.8* 7.8* 8.1* 8.2* 8.2*     GFR: Estimated Creatinine Clearance: 131 mL/min (by C-G formula based on SCr of 0.77 mg/dL).  Liver Function Tests: Recent Labs  Lab 09/24/22 0240 09/25/22 0337  AST 13* 13*  ALT 15 17  ALKPHOS 73 71  BILITOT 0.5 0.5  PROT 4.8* 4.7*  ALBUMIN 2.4* 2.4*     CBG: Recent Labs  Lab 09/29/22 1132 09/29/22 1719 09/29/22 2217 09/30/22 0735 09/30/22 1109  GLUCAP 135* 181* 208* 120* 115*      No results found for this or any previous visit (from the past 240 hour(s)).       Radiology Studies: No results found.      Scheduled Meds:  acetaminophen  1,000 mg Oral Q8H   apixaban  5 mg Oral BID   atorvastatin  80 mg Oral Daily   Chlorhexidine Gluconate Cloth  6 each Topical Daily   clopidogrel  75 mg Oral Daily   finasteride  5 mg Oral Daily   gabapentin  300 mg Oral q1800   gabapentin  300 mg Oral Daily   hydrocortisone  20 mg Oral Daily   And   hydrocortisone  10 mg Oral Daily   insulin aspart  0-5 Units Subcutaneous QHS   insulin aspart  0-9 Units Subcutaneous TID WC   lidocaine  1 patch Transdermal Daily   melatonin  5 mg Oral q1800   midodrine  10 mg Oral TID with meals   nystatin   Topical BID   pantoprazole  40  mg Oral Daily   sodium chloride flush  10-40 mL Intracatheter Q12H   tamsulosin  0.4 mg Oral Daily   traZODone  50 mg Oral q1800   zolpidem  5 mg Oral q1800   Continuous Infusions:  sodium chloride 250 mL (09/14/22 0248)   sodium chloride 10 mL/hr at 09/24/22 1019     LOS: 29 days    Time spent: [place],  MD [facility]   To contact the attending provider between 7A-7P or the covering provider during after hours 7P-7A, please log into the web site [email or web address] and access using universal [place] password for that web site. If you do not have the password, please call the hospital operator.  09/30/2022, 1:02 PM

## 2022-10-01 DIAGNOSIS — I480 Paroxysmal atrial fibrillation: Secondary | ICD-10-CM | POA: Diagnosis not present

## 2022-10-01 DIAGNOSIS — N1330 Unspecified hydronephrosis: Secondary | ICD-10-CM | POA: Diagnosis not present

## 2022-10-01 DIAGNOSIS — R310 Gross hematuria: Secondary | ICD-10-CM | POA: Diagnosis not present

## 2022-10-01 DIAGNOSIS — D696 Thrombocytopenia, unspecified: Secondary | ICD-10-CM

## 2022-10-01 DIAGNOSIS — N179 Acute kidney failure, unspecified: Secondary | ICD-10-CM | POA: Diagnosis not present

## 2022-10-01 LAB — COMPREHENSIVE METABOLIC PANEL
ALT: 14 U/L (ref 0–44)
AST: 12 U/L — ABNORMAL LOW (ref 15–41)
Albumin: 2.8 g/dL — ABNORMAL LOW (ref 3.5–5.0)
Alkaline Phosphatase: 72 U/L (ref 38–126)
Anion gap: 5 (ref 5–15)
BUN: 32 mg/dL — ABNORMAL HIGH (ref 8–23)
CO2: 23 mmol/L (ref 22–32)
Calcium: 8 mg/dL — ABNORMAL LOW (ref 8.9–10.3)
Chloride: 110 mmol/L (ref 98–111)
Creatinine, Ser: 0.77 mg/dL (ref 0.61–1.24)
GFR, Estimated: >60 mL/min (ref >60)
Glucose, Bld: 131 mg/dL — ABNORMAL HIGH (ref 70–99)
Potassium: 3.6 mmol/L (ref 3.5–5.1)
Sodium: 138 mmol/L (ref 135–145)
Total Bilirubin: 0.6 mg/dL (ref 0.3–1.2)
Total Protein: 4.9 g/dL — ABNORMAL LOW (ref 6.5–8.1)

## 2022-10-01 LAB — CBC
HCT: 25 % — ABNORMAL LOW (ref 39.0–52.0)
Hemoglobin: 7.3 g/dL — ABNORMAL LOW (ref 13.0–17.0)
MCH: 22.9 pg — ABNORMAL LOW (ref 26.0–34.0)
MCHC: 29.2 g/dL — ABNORMAL LOW (ref 30.0–36.0)
MCV: 78.4 fL — ABNORMAL LOW (ref 80.0–100.0)
Platelets: 68 10*3/uL — ABNORMAL LOW (ref 150–400)
RBC: 3.19 MIL/uL — ABNORMAL LOW (ref 4.22–5.81)
RDW: 18.1 % — ABNORMAL HIGH (ref 11.5–15.5)
WBC: 5.3 10*3/uL (ref 4.0–10.5)
nRBC: 0 % (ref 0.0–0.2)

## 2022-10-01 LAB — GLUCOSE, CAPILLARY
Glucose-Capillary: 103 mg/dL — ABNORMAL HIGH (ref 70–99)
Glucose-Capillary: 123 mg/dL — ABNORMAL HIGH (ref 70–99)
Glucose-Capillary: 155 mg/dL — ABNORMAL HIGH (ref 70–99)
Glucose-Capillary: 179 mg/dL — ABNORMAL HIGH (ref 70–99)

## 2022-10-01 NOTE — Progress Notes (Signed)
PROGRESS NOTE    Loc Feinstein  EXH:[id number] DOB: 1956/03/09 DOA: 08/31/2022 PCP: Clovia Cuff, MD    Chief Complaint  Patient presents with   Hematuria    Brief Narrative:  66 year old man who presented to the [facility] ED 11/11 with a chief complaint of hematuria. PMHx significant for HTN, HLD, HFrEF/ICM EF 40-45%, CAD/NSTEMI, TIA, PAF (on Eliquis), T2DM, BPH (hx prostate CA).   Patient with questionable length of time of hematuria which may have been going on since 10/13 per patient.  Sent to the emergency department by SNF staff on 11/11.   At the emergency department he developed hypotension with a BP 83/63. WBC 14.2, LA 5.0 > 7.9. Code Sepsis was called. Blood cultures and urine cultures were obtained. Patient was given fluid bolus per sepsis guidelines.  Patient with complicated infectious history-started on meropenem and amoxicillin by TRH.  Urology was consulted for hematuria and concern for Fournier's.  Recommends monitoring with possible cystoscopy once Eliquis washout. TRH admitted patient.   11/11 Admit by TRH, BC>, UC>, PCCM consult, Mero and amoxicillin> 11/12 Started on peripheral Levophed for persistent hypotension, albumin 11/13 Troponin rising from 263 to 771, DVT/PE ruled out 11/14 dobutamine started for low coox, lower EF on echo 11/15 trial of restarting heparin 11/17 off dobutamine 11/19 coox 93, remains on pressors 11/20 Coox 67, CVP 8, on 3 norepi 11/21 Coox 75.1, 4 norepi  11/23 Norepi weaned to 33mg, hypotension into 80's overnight > increased levo. Robust UOP despite no lasix in 72 hours.   11/24 Levophed weaned off briefly yesterday and restarted overnight 11/26 Patient responsive to IVF resuscitation. Initial urine studies suggest hypovolemic hyponatremia 11/27 Ongoing mild hypotension, goal MAP 60. Remains on NE 272m. Midodrine increased, droxidopa added. Mild persistent hematuria with clots.       Assessment & Plan:   Principal Problem:   Gross  hematuria Active Problems:   Acute kidney injury (HCFranquez  Coronary artery disease due to lipid rich plaque   UTI (urinary tract infection)   Borderline low blood pressure determined by examination   Lactic acidosis   Diabetic polyneuropathy associated with type 2 diabetes mellitus (HCC)   PAF (paroxysmal atrial fibrillation) (HCC)   Acute on chronic HFrEF (heart failure with reduced ejection fraction) (HCC)   Pressure ulcer of sacral region, stage 2 (HCC)   Dyslipidemia   Cellulitis of scrotum   Acute prostatitis   Septic shock (HCC)   Hyponatremia   Cardiogenic shock (HCC)   Physical deconditioning   Palliative care by specialist   Goals of care, counseling/discussion   AKI (acute kidney injury) (HCTibes  Mood altered   Medication management   Need for emotional support   Pain   High risk medication use   Counseling and coordination of care   Acute on chronic systolic congestive heart failure (HCC)   HFrEF (heart failure with reduced ejection fraction) (HCC)   Orthostatic hypotension   Hydronephrosis   Thrombocytopenia (HCC)  Shock, mixed septic from UTI >cardiogenic shock with new depressed EF>hypovolemic shock d/t overdiuresis Hypovolemic hyponatremia Clinically resolved infection and on appropriate antibiotics. Echo with EF 30% with concerns for cardiogenic shock however patient net negative volume. -Cardiology following intermittently while on any diuretics. -Droxidopa has been weaned off currently on home regimen midodrine.  - Continue hydrocortisone; decreased to '20mg'$  daily /'10mg'$  qhs as of 11/27 -Plan to ultimately taper midodrine, dose initially increased to '20mg'$  tid frrom '15mg'$  tid per PCCM on 11/27. Now on midodrine to '10mg'$   TID per cardiology -Systolic blood pressures currently soft.  -Dr. Wyline Copas discussed with Critical Care who is familiar with patient's history. Pt historically has chronically low bp with sbp as low as 70-80's. Per Critical Care, recommendation to wean  droxidopa to off, which has been weaned off. -Status post full course of empiric IV antibiotics of ertapenem.  -Follow.   Acute on chronic HFrEF, ICM  CAD with stent in 05/2022 > 09/03/22 TTE> EF now 30-35, G2DD  pAF -Concerns for mixed cardiogenic shock during the hospitalization. - Cardiology consulted; not a candidate for home inotropes, revascularization, transplant or LVAD - Unable to tolerate GDMT due to persistent hypotension -Continue Eliquis, Plavix.   -Cardiology following intermittently and patient unable to be started on GDMT while on midodrine and droxidopa.   -Droxidopa has been weaned off.   -Patient with some complaints of shortness of breath, noted to have 1+ lower extremity edema, and scrotal swelling, 09/26/2022. -BP soft and as such patient given a dose of IV albumin followed by Lasix 20 mg IV x 1 with urine output of 1.950 L. -Patient with urine output of 800 cc recorded over the past 24 hours.  -Patient with scrotal swelling as well, and received IV albumin and Lasix 20 mg IV x 1 on 09/28/2022 with good urine output. -Patient is -13.25 L during this hospitalization.    -Strict I's and O's. -Cardiology has signed off.   -Outpatient follow-up with primary cardiologist.     Prostatitis Scrotal Cellulitis H/O ESBL E.Coli and CRE Proteus - ID consulted, appreciate recs -Last seen by ID on 09/05/2022.  Plan is for ertapenem 1 g daily for 4 weeks, end date 09/29/2022 - PICC in place  -Status post full course antibiotics of ertapenem.   - Outpatient ID f/u, established in [place]   Hematuria due to UTI Known prostate cancer, under surveillance with urology - Trend H&H - Keep Foley until cleared by Urology post-discharge - Continue Eliquis/Plavix, monitor closely in the setting of hematuria - Continue Flomax, Proscar -Will need outpatient follow-up with urology. -Initially plan was to discharge with Foley catheter in place however at patient's insistence, and with  urologist agreement Foley catheter removed today for voiding trial. -Patient noted to have some hematuria which improved prior to voiding trial today.  -Patient a few days ago was frustrated and upset that he has not been seen by urologist since Foley catheter was placed and demanding to be seen by urologist as he has further questions as to when Foley catheter can be removed. -Seen by Dr. Alinda Money briefly on 09/29/2022. -Seen by Dr. Gloriann Loan, urology who discussed with patient with his concern for Foley catheter removal and voiding trial was ordered however patient noted to have refused removal of Foley catheter on 09/30/2022 and wanted Foley catheter removed today 10/01/2022 which has been done.   -Voiding trial pending.  -Urology recommended outpatient cystoscopy for further hematuria workup. -Per urology.   Hyponatremia, hypovolemic Mild AKI - improved Hypocalcemia  -Electrolytes improved, sodium at 138, potassium at 3.6.   -Creatinine down to 0.77.   -Avoid nephrotoxic agents.   DM2 -Hemoglobin A1c 6.6 (08/03/2022). -CBG 103 this morning.  -SSI.     Deconditioning - PT/OT while in-house;  -PT recommended SNF -Patient noted to be refusing PT therapies early on in the hospitalization. -Has been reassessed by PT who are still recommending SNF. - Utilize prosthesis as able - patient does NOT want to return to prior SNF [facility]). TOC following and looking for  a different SNF for patient.   Chronic Stage 2 sacral ulcer, POA - WOC   Insomnia, phantom limb pain LLE -Continue reduced dose gabapentin, melatonin, trazodone, Ambien nightly as needed.  - Ortho tech consult for LLE sleeve which has been placed.   Chronic Critical Illness  - PMT consulted, appreciate recommendations - Patient acknowledges medical measures have not changed QOL - Continues to desire current scope of care - DNR code status  Thrombocytopenia -Patient noted with a gradual thrombocytopenia .-Patient noted to  have presented with some hematuria which is slowly improved. -Etiology of thrombocytopenia may be secondary to acute infection and IV antibiotics. -IV antibiotics have been completed and no further antibiotics needed at this time. -Monitor thrombocytopenia and a platelet count < 50, may need to consider holding anticoagulation and will need further discussion with cardiology concerning her antiplatelet medications.       DVT prophylaxis: Eliquis Code Status: DNR Family Communication: Updated patient.  No family at bedside. Disposition: Medically stable.  Awaiting SNF  Status is: Inpatient Remains inpatient appropriate because: Severitiy of illness   Consultants:  Urology: Dr Gloriann Loan 08/31/2022 PCCM: Dr Patsey Berthold 09/01/2022 Wound care: Julien Girt, RN 09/02/2022 ID: Dr. Juleen China 09/03/2022 Palliaitve Care: Dr Rowe Pavy 09/03/2022 Cardiology: Dr Gasper Sells 09/04/2022  Procedures:  CT renal stone protocol 08/31/2022 CT angiogram chest 09/02/2022 Chest x-ray 09/02/2022 2D echo 09/03/2022 Bilateral lower extremity Dopplers 09/02/2022  Antimicrobials:  Anti-infectives (From admission, onward)    Start     Dose/Rate Route Frequency Ordered Stop   09/05/22 1600  ertapenem (INVANZ) 1,000 mg in sodium chloride 0.9 % 100 mL IVPB        1 g 200 mL/hr over 30 Minutes Intravenous Every 24 hours 09/05/22 1357 09/30/22 0601   09/03/22 0800  meropenem (MERREM) 1 g in sodium chloride 0.9 % 100 mL IVPB  Status:  Discontinued        1 g 200 mL/hr over 30 Minutes Intravenous Every 8 hours 09/03/22 0201 09/05/22 1357   09/01/22 1100  meropenem (MERREM) 1 g in sodium chloride 0.9 % 100 mL IVPB  Status:  Discontinued        1 g 200 mL/hr over 30 Minutes Intravenous Every 12 hours 09/01/22 0416 09/03/22 0201   09/01/22 1100  fluconazole (DIFLUCAN) IVPB 200 mg  Status:  Discontinued        200 mg 100 mL/hr over 60 Minutes Intravenous Every 24 hours 09/01/22 1020 09/04/22 1309   09/01/22 0600   meropenem (MERREM) 1 g in sodium chloride 0.9 % 100 mL IVPB  Status:  Discontinued        1 g 200 mL/hr over 30 Minutes Intravenous Every 8 hours 08/31/22 2136 09/01/22 0416   08/31/22 2200  sulfamethoxazole-trimethoprim (BACTRIM DS) 800-160 MG per tablet 1 tablet  Status:  Discontinued        1 tablet Oral 2 times daily 08/31/22 2011 08/31/22 2129   08/31/22 2200  amoxicillin (AMOXIL) capsule 500 mg  Status:  Discontinued        500 mg Oral Every 8 hours 08/31/22 2131 09/03/22 1417   08/31/22 2145  meropenem (MERREM) 1 g in sodium chloride 0.9 % 100 mL IVPB        1 g 200 mL/hr over 30 Minutes Intravenous  Once 08/31/22 2136 09/01/22 0820   08/31/22 1815  cefTRIAXone (ROCEPHIN) 1 g in sodium chloride 0.9 % 100 mL IVPB        1 g 200 mL/hr over 30 Minutes Intravenous  Once 08/31/22 1814 08/31/22 1911         Subjective: Laying in bed.  Foley catheter removed this morning per patient's insistence with urologist agreement for voiding trial.  No chest pain.  No shortness of breath.  No abdominal pain.  Objective: Vitals:   10/01/22 1300 10/01/22 1344 10/01/22 1425 10/01/22 1805  BP:   106/69   Pulse:   88   Resp: '20 15 16 17  '$ Temp:   98 F (36.7 C)   TempSrc:   Oral   SpO2:   98%   Weight:      Height:        Intake/Output Summary (Last 24 hours) at 10/01/2022 1837 Last data filed at 10/01/2022 0600 Gross per 24 hour  Intake 100 ml  Output 800 ml  Net -700 ml   Filed Weights   09/28/22 0500 09/30/22 0700 10/01/22 0500  Weight: 117.1 kg 121.3 kg 120.8 kg    Examination:  General exam: NAD. Respiratory system: CTAB.  No wheezes, no crackles, no rhonchi.  Fair air movement.  Speaking in full sentences.    Cardiovascular system: RRR no murmurs rubs or gallops.  No JVD.  Trace right lower extremity edema.  Gastrointestinal system: Abdomen is soft, nontender, nondistended, positive bowel sounds.  No rebound.  No guarding. Central nervous system: Alert and oriented. No  focal neurological deficits. Extremities: Trace right lower extremity edema.  Status post left BKA.   Scrotum:: Scrotal swelling slightly improving.  Skin: No rashes, lesions or ulcers Psychiatry: Judgement and insight appear normal. Mood & affect appropriate.     Data Reviewed: I have personally reviewed following labs and imaging studies  CBC: Recent Labs  Lab 09/27/22 0329 09/28/22 0318 09/29/22 0047 09/29/22 1035 09/30/22 0225 09/30/22 1445 10/01/22 0348  WBC 5.6 6.2  --  7.2 5.0  --  5.3  HGB 7.4* 7.4* 7.3* 7.4* 7.1* 7.8* 7.3*  HCT 24.7* 24.9* 24.6* 25.0* 24.3* 26.7* 25.0*  MCV 78.9* 78.5*  --  78.1* 77.9*  --  78.4*  PLT 90* 85*  --  80* 72*  --  68*    Basic Metabolic Panel: Recent Labs  Lab 09/27/22 0329 09/28/22 0318 09/29/22 0047 09/30/22 0225 10/01/22 0348  NA 135 138 139 138 138  K 3.6 3.6 3.8 3.9 3.6  CL 107 111 111 110 110  CO2 21* '23 22 22 23  '$ GLUCOSE 162* 173* 199* 161* 131*  BUN 28* 30* 31* 30* 32*  CREATININE 0.78 0.80 0.81 0.77 0.77  CALCIUM 7.8* 8.1* 8.2* 8.2* 8.0*    GFR: Estimated Creatinine Clearance: 130.7 mL/min (by C-G formula based on SCr of 0.77 mg/dL).  Liver Function Tests: Recent Labs  Lab 09/25/22 0337 10/01/22 0348  AST 13* 12*  ALT 17 14  ALKPHOS 71 72  BILITOT 0.5 0.6  PROT 4.7* 4.9*  ALBUMIN 2.4* 2.8*    CBG: Recent Labs  Lab 09/30/22 1723 09/30/22 2047 10/01/22 0749 10/01/22 1153 10/01/22 1724  GLUCAP 160* 166* 103* 123* 155*     No results found for this or any previous visit (from the past 240 hour(s)).       Radiology Studies: No results found.      Scheduled Meds:  acetaminophen  1,000 mg Oral Q8H   apixaban  5 mg Oral BID   atorvastatin  80 mg Oral Daily   Chlorhexidine Gluconate Cloth  6 each Topical Daily   clopidogrel  75 mg Oral Daily   finasteride  5 mg Oral Daily   gabapentin  300 mg Oral q1800   gabapentin  300 mg Oral Daily   hydrocortisone  20 mg Oral Daily   And    hydrocortisone  10 mg Oral Daily   insulin aspart  0-5 Units Subcutaneous QHS   insulin aspart  0-9 Units Subcutaneous TID WC   lidocaine  1 patch Transdermal Daily   melatonin  5 mg Oral q1800   midodrine  10 mg Oral TID with meals   nystatin   Topical BID   pantoprazole  40 mg Oral Daily   sodium chloride flush  10-40 mL Intracatheter Q12H   tamsulosin  0.4 mg Oral Daily   traZODone  50 mg Oral q1800   zolpidem  5 mg Oral q1800   Continuous Infusions:  sodium chloride 250 mL (09/14/22 0248)   sodium chloride 10 mL/hr at 09/24/22 1019     LOS: 30 days    Time spent: [place], MD [facility]   To contact the attending provider between 7A-7P or the covering provider during after hours 7P-7A, please log into the web site [email or web address] and access using universal [place] password for that web site. If you do not have the password, please call the hospital operator.  10/01/2022, 6:37 PM

## 2022-10-02 DIAGNOSIS — R310 Gross hematuria: Secondary | ICD-10-CM | POA: Diagnosis not present

## 2022-10-02 LAB — BASIC METABOLIC PANEL
Anion gap: 6 (ref 5–15)
BUN: 32 mg/dL — ABNORMAL HIGH (ref 8–23)
CO2: 21 mmol/L — ABNORMAL LOW (ref 22–32)
Calcium: 8.2 mg/dL — ABNORMAL LOW (ref 8.9–10.3)
Chloride: 109 mmol/L (ref 98–111)
Creatinine, Ser: 1.08 mg/dL (ref 0.61–1.24)
GFR, Estimated: >60 mL/min (ref >60)
Glucose, Bld: 175 mg/dL — ABNORMAL HIGH (ref 70–99)
Potassium: 4 mmol/L (ref 3.5–5.1)
Sodium: 136 mmol/L (ref 135–145)

## 2022-10-02 LAB — CBC
HCT: 25.9 % — ABNORMAL LOW (ref 39.0–52.0)
Hemoglobin: 7.5 g/dL — ABNORMAL LOW (ref 13.0–17.0)
MCH: 22.5 pg — ABNORMAL LOW (ref 26.0–34.0)
MCHC: 29 g/dL — ABNORMAL LOW (ref 30.0–36.0)
MCV: 77.5 fL — ABNORMAL LOW (ref 80.0–100.0)
Platelets: 67 10*3/uL — ABNORMAL LOW (ref 150–400)
RBC: 3.34 MIL/uL — ABNORMAL LOW (ref 4.22–5.81)
RDW: 18.3 % — ABNORMAL HIGH (ref 11.5–15.5)
WBC: 5.5 10*3/uL (ref 4.0–10.5)
nRBC: 0 % (ref 0.0–0.2)

## 2022-10-02 LAB — GLUCOSE, CAPILLARY
Glucose-Capillary: 134 mg/dL — ABNORMAL HIGH (ref 70–99)
Glucose-Capillary: 140 mg/dL — ABNORMAL HIGH (ref 70–99)
Glucose-Capillary: 160 mg/dL — ABNORMAL HIGH (ref 70–99)
Glucose-Capillary: 184 mg/dL — ABNORMAL HIGH (ref 70–99)

## 2022-10-02 MED ORDER — ACETAMINOPHEN 500 MG PO TABS
500.0000 mg | ORAL_TABLET | Freq: Three times a day (TID) | ORAL | Status: DC
  Administered 2022-10-02 – 2022-10-05 (×8): 500 mg via ORAL
  Filled 2022-10-02 (×9): qty 1

## 2022-10-02 NOTE — Progress Notes (Signed)
[facility] Progress Note Patient: Peter Yang YPP:[id number] DOB: 11-16-1955 DOA: 08/31/2022  DOS: the patient was seen and examined on 10/02/2022  Brief hospital course: 66 year old man who presented to the [facility] ED 11/11 with a chief complaint of hematuria. PMHx significant for HTN, HLD, HFrEF/ICM EF 40-45%, CAD/NSTEMI, TIA, PAF (on Eliquis), T2DM, BPH (hx prostate CA). Patient with questionable length of time of hematuria which may have been going on since 10/13 per patient.  Sent to the emergency department by SNF staff on 11/11. 11/11 Admit by TRH, BC>, UC>, PCCM consult.  During his course patient has been seen by urology.  ID was also consulted patient was on IV antibiotics for 4 weeks.  Palliative care was consulted for symptom management and goals of care. Assessment and Plan: Septic shock as well as cardiogenic shock and hypovolemic shock. Treated with IV antibiotics, IV pressors as well as midodrine blood pressure currently stable. Shock physiology resolved. Sepsis physiology resolved as well. Continue midodrine for now.  Acute on chronic HFrEF. Ischemic cardiomyopathy. CAD SP PCI. Significant concern for cardiogenic shock. Cardiology was consulted. Not a candidate for home inotropes, revascularization or transplant or LVAD. Unable to tolerate GDMT due to hypotension. Currently on Eliquis and Plavix. Patient definitely appears to be volume overloaded.  Will benefit from IV albumin with Lasix combination which she has received in the past.  Will reevaluate on 12/14 for this needed.  Prostatitis. Scrotal cellulitis. History of ESBL E. coli and CRE Proteus. Urology was consulted. ID was consulted. Patient was started on ertapenem for 4 weeks. PICC line currently in place. Antibiotic course completed as of 12/10. Will discuss with ID if they would like to follow-up in the hospital otherwise recommended outpatient ID follow-up.  Hematuria. History of prostate  cancer. H&H relatively stable. No further hematuria seen. Patient had a Foley catheter. Removed on 12/12. Patient able to void after that. Currently has a urostomy bag as the patient's scrotum is still swollen significantly.  Hyponatremia. Hypocalcemia. AKI. Resolved.  Monitor.  Type 2 diabetes mellitus, controlled with long-term insulin use. Continue sliding scale insulin as well as long-acting insulin. Hemoglobin A1c was 6.6 in October.  Deconditioning. PT recommends SNF. Social worker currently working.  Chronic stage II sacral ulcer. Present on admission. Continue wound care.  Left lower extremity phantom pain. Insomnia. On gabapentin, melatonin, trazodone, Ambien.  New onset thrombocytopenia. At baseline platelet counts are normal. For last 7 days that has been a consistent downward trend in platelet count No active bleeding reported by the patient. Will perform further workup. This is most likely nutritional deficiency given patient also has nutritional deficiency anemia along with acute infection and antibiotic causing bone marrow suppression. Currently holding Eliquis.  Await platelet count more than 90,000 before resuming. With the platelet count drops below 40,000 would prefer to stop Plavix as well.  Obesity Body mass index is 32.69 kg/m.  Placing the pt at higher risk of poor outcomes.   Subjective: No nausea no vomiting no fever no chills.  Reports pain is well-controlled.  Physical Exam: General: in mild distress;  Cardiovascular: S1 and S2 Present, no Murmur Respiratory: Good respiratory effort, Bilateral Air entry present, no Crackles, no wheezes Abdomen: Bowel Sound present, Non tender  Extremities: Generalized anasarca noted on all extremities as well as lower back area, left BKA. Neurology: alert and oriented to time, place, and person   Data Reviewed: I have Reviewed nursing notes, Vitals, and Lab results. Since last encounter, pertinent lab  results CBC  and BMP   . I have ordered test including CBC and BMP  .   Disposition: Status is: Inpatient Remains inpatient appropriate because: Needs inpatient stay due to worsening, cytopenia  Place and maintain sequential compression device Start: 10/02/22 1444 SCDs Start: 08/31/22 1944   Family Communication: No one at bedside Level of care: Telemetry continue telemetry for hypotension. Vitals:   10/02/22 1056 10/02/22 1255 10/02/22 1716 10/02/22 1801  BP:  107/73    Pulse:  72    Resp: '17 18 18 17  '$ Temp:  97.9 F (36.6 C)    TempSrc:  Oral    SpO2:  98%    Weight:      Height:         Author: Berle Mull, MD 10/02/2022 7:07 PM  Please look on [email or web address] to find out who is on call.

## 2022-10-02 NOTE — TOC Progression Note (Signed)
Transition of Care [facility]) - Progression Note    Patient Details  Name: Peter Yang MRN: [id number] Date of Birth: 1956-02-03  [facility] CM/SW Contact  Camyla Camposano, Juliann Pulse, RN Phone Number: 10/02/2022, 10:01 AM  Clinical Narrative:  Still waiting for patient to decide on facility from bed offers. He defers to friend [name] but they have not chosen a facility yet. I will need to start auth. MD updated.     Expected Discharge Plan: [place] Barriers to Discharge: Continued Medical Work up  Expected Discharge Plan and Services Expected Discharge Plan: [place]   Discharge Planning Services: CM Consult   Living arrangements for the past 2 months: Single Family Home                                       Social Determinants of Health (SDOH) Interventions Transportation Interventions: Inpatient TOC Utilities Interventions: Inpatient TOC  Readmission Risk Interventions    09/02/2022   10:36 AM  Readmission Risk Prevention Plan  Transportation Screening Complete  Medication Review (RN Care Manager) Complete  PCP or Specialist appointment within 3-5 days of discharge Complete  HRI or Home Care Consult Complete  SW Recovery Care/Counseling Consult Complete  Palliative Care Screening Not [place] Complete

## 2022-10-03 DIAGNOSIS — D509 Iron deficiency anemia, unspecified: Secondary | ICD-10-CM

## 2022-10-03 DIAGNOSIS — D696 Thrombocytopenia, unspecified: Secondary | ICD-10-CM | POA: Diagnosis not present

## 2022-10-03 DIAGNOSIS — Z8546 Personal history of malignant neoplasm of prostate: Secondary | ICD-10-CM

## 2022-10-03 DIAGNOSIS — E1140 Type 2 diabetes mellitus with diabetic neuropathy, unspecified: Secondary | ICD-10-CM

## 2022-10-03 DIAGNOSIS — I10 Essential (primary) hypertension: Secondary | ICD-10-CM | POA: Diagnosis not present

## 2022-10-03 LAB — CBC WITH DIFFERENTIAL/PLATELET
Abs Immature Granulocytes: 0.01 10*3/uL (ref 0.00–0.07)
Basophils Absolute: 0 10*3/uL (ref 0.0–0.1)
Basophils Relative: 0 %
Eosinophils Absolute: 0.2 10*3/uL (ref 0.0–0.5)
Eosinophils Relative: 4 %
HCT: 23.6 % — ABNORMAL LOW (ref 39.0–52.0)
Hemoglobin: 6.9 g/dL — CL (ref 13.0–17.0)
Immature Granulocytes: 0 %
Lymphocytes Relative: 19 %
Lymphs Abs: 0.9 10*3/uL (ref 0.7–4.0)
MCH: 22.6 pg — ABNORMAL LOW (ref 26.0–34.0)
MCHC: 29.2 g/dL — ABNORMAL LOW (ref 30.0–36.0)
MCV: 77.4 fL — ABNORMAL LOW (ref 80.0–100.0)
Monocytes Absolute: 0.5 10*3/uL (ref 0.1–1.0)
Monocytes Relative: 11 %
Neutro Abs: 3.1 10*3/uL (ref 1.7–7.7)
Neutrophils Relative %: 66 %
Platelets: 61 10*3/uL — ABNORMAL LOW (ref 150–400)
RBC: 3.05 MIL/uL — ABNORMAL LOW (ref 4.22–5.81)
RDW: 18.2 % — ABNORMAL HIGH (ref 11.5–15.5)
WBC: 4.7 10*3/uL (ref 4.0–10.5)
nRBC: 0 % (ref 0.0–0.2)

## 2022-10-03 LAB — COMPREHENSIVE METABOLIC PANEL
ALT: 12 U/L (ref 0–44)
AST: 10 U/L — ABNORMAL LOW (ref 15–41)
Albumin: 2.7 g/dL — ABNORMAL LOW (ref 3.5–5.0)
Alkaline Phosphatase: 69 U/L (ref 38–126)
Anion gap: 5 (ref 5–15)
BUN: 34 mg/dL — ABNORMAL HIGH (ref 8–23)
CO2: 21 mmol/L — ABNORMAL LOW (ref 22–32)
Calcium: 8.2 mg/dL — ABNORMAL LOW (ref 8.9–10.3)
Chloride: 110 mmol/L (ref 98–111)
Creatinine, Ser: 0.85 mg/dL (ref 0.61–1.24)
GFR, Estimated: >60 mL/min (ref >60)
Glucose, Bld: 146 mg/dL — ABNORMAL HIGH (ref 70–99)
Potassium: 4 mmol/L (ref 3.5–5.1)
Sodium: 136 mmol/L (ref 135–145)
Total Bilirubin: 0.6 mg/dL (ref 0.3–1.2)
Total Protein: 4.8 g/dL — ABNORMAL LOW (ref 6.5–8.1)

## 2022-10-03 LAB — TECHNOLOGIST SMEAR REVIEW: Plt Morphology: NORMAL

## 2022-10-03 LAB — CBC
HCT: 27.1 % — ABNORMAL LOW (ref 39.0–52.0)
Hemoglobin: 8 g/dL — ABNORMAL LOW (ref 13.0–17.0)
MCH: 23 pg — ABNORMAL LOW (ref 26.0–34.0)
MCHC: 29.5 g/dL — ABNORMAL LOW (ref 30.0–36.0)
MCV: 77.9 fL — ABNORMAL LOW (ref 80.0–100.0)
Platelets: 65 10*3/uL — ABNORMAL LOW (ref 150–400)
RBC: 3.48 MIL/uL — ABNORMAL LOW (ref 4.22–5.81)
RDW: 17.9 % — ABNORMAL HIGH (ref 11.5–15.5)
WBC: 5.5 10*3/uL (ref 4.0–10.5)
nRBC: 0 % (ref 0.0–0.2)

## 2022-10-03 LAB — MAGNESIUM: Magnesium: 1.9 mg/dL (ref 1.7–2.4)

## 2022-10-03 LAB — GLUCOSE, CAPILLARY
Glucose-Capillary: 133 mg/dL — ABNORMAL HIGH (ref 70–99)
Glucose-Capillary: 150 mg/dL — ABNORMAL HIGH (ref 70–99)
Glucose-Capillary: 150 mg/dL — ABNORMAL HIGH (ref 70–99)
Glucose-Capillary: 193 mg/dL — ABNORMAL HIGH (ref 70–99)
Glucose-Capillary: 216 mg/dL — ABNORMAL HIGH (ref 70–99)

## 2022-10-03 LAB — FIBRINOGEN: Fibrinogen: 244 mg/dL (ref 210–475)

## 2022-10-03 LAB — PREPARE RBC (CROSSMATCH)

## 2022-10-03 LAB — PROTIME-INR
INR: 1.5 — ABNORMAL HIGH (ref 0.8–1.2)
Prothrombin Time: 18.3 seconds — ABNORMAL HIGH (ref 11.4–15.2)

## 2022-10-03 LAB — LACTATE DEHYDROGENASE: LDH: 81 U/L — ABNORMAL LOW (ref 98–192)

## 2022-10-03 LAB — FOLATE: Folate: 6.5 ng/mL (ref >5.9)

## 2022-10-03 MED ORDER — SODIUM CHLORIDE 0.9% IV SOLUTION: Freq: Once | INTRAVENOUS | Status: AC

## 2022-10-03 MED ORDER — FUROSEMIDE 10 MG/ML IJ SOLN
20.0000 mg | Freq: Once | INTRAMUSCULAR | Status: AC
  Administered 2022-10-03: 20 mg via INTRAVENOUS
  Filled 2022-10-03: qty 2

## 2022-10-03 NOTE — Plan of Care (Signed)
  Problem: Metabolic: Goal: Ability to maintain appropriate glucose levels will improve Outcome: Not Progressing

## 2022-10-03 NOTE — Consult Note (Signed)
New Hematology/Oncology Consult   Requesting MD: Dr. Posey Pronto       Reason for Consult: Anemia, thrombocytopenia  HPI: Peter Yang has a complex medical history including a history of prostate cancer, paroxysmal atrial fibrillation on Eliquis, coronary artery disease, ischemic cardiomyopathy, and tract infection.  He had COVID-19 in October.  He was admitted from the emergency room on 08/31/2022 with gross hematuria.  He was completing a course of Bactrim DS for ESBL E. coli and carbapenem resistant Proteus.  He had presumed cardiogenic and septic shock.  He was diagnosed with prostatitis.  Pressors were weaned to off and he was transferred to the hospitalist service 09/17/2022. He was seen in consultation by the infectious disease service and treated with meropenem and Diflucan for the prostatitis, UTI, and scrotal cellulitis.  Completed a course of antibiotics on 09/29/2022.  Hematuria resolved and the Foley catheter was removed on 10/01/2022.  Mr Aurelio Jew has anemic throughout this hospital admission.  He was also anemic September and October.  The MCV is low and the ferritin returned 10 on 09/30/2022.  Platelet count has been mildly low intermittently over the past few months and was normal on hospital admission.  The platelet count has fallen gradually over the past 10 days.  He reports no previous history of a hematologic condition.    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   Coronary artery disease involving native coronary artery of native heart without angina pectoris 06/25/2022   Diabetic polyneuropathy associated with type 2 diabetes mellitus ([place]) 07/28/2021   Essential hypertension 01/25/2016   Hematuria and +fecal occult  06/25/2022   History of diabetic ulcer of foot 12/31/2019   History of non-ST elevation myocardial infarction  (NSTEMI) 06/15/2022   Hx of right coronary artery stent placement 02/11/2022   Hyperlipidemia    Ischemic cardiomyopathy    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 Overview:  Added automatically from request for surgery [id number] Formatting of this note might be different from the original. Added automatically from request for surgery [id number]   Large bowel perforation ([place]) 09/27/2015   Malignant neoplasm of prostate ([place]) 10/22/2019   Myopia with astigmatism and presbyopia, bilateral 06/15/2018   Last Assessment & Plan:  Formatting of this note might be different from the original. - Wrx printed Formatting of this note might be different from the original. 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 - Stable, monitor Formatting of this note might be different from the original. Last Assessment & Plan:  - Stable, monitor   Retroperitoneal abscess ([place]) 10/02/2015   Status post corneal transplant 10/07/2016   Formatting of this note might be different from the original. Overview:  Added automatically from request for surgery [id number] Formatting of this note might be different from the original. Added automatically from request for surgery [id number]  Last Assessment & Plan:  Formatting of this note might be different from the original. - OD, 2/2 KCN - Stable, no  NVK / rejection - Continue off steroids - Mo   Thoracic ascending aortic aneurysm (HCC)    TIA (transient ischemic attack) 2012   Type 2 diabetes mellitus ([place])   :   Past Surgical History:  Procedure Laterality Date   BELOW KNEE LEG AMPUTATION Left 2022    CORONARY ANGIOPLASTY WITH STENT PLACEMENT  01/2022   DES RCA   LEFT HEART CATH AND CORONARY ANGIOGRAPHY N/A 06/17/2022   Procedure: LEFT HEART CATH AND CORONARY ANGIOGRAPHY;  Surgeon: Nelva Bush, MD;  Location: [facility];  Service: Cardiovascular;  Laterality: N/A;   ROTATOR CUFF REPAIR Left   :   Current Facility-Administered Medications:    0.9 %  sodium chloride infusion, 250 mL, Intravenous, Continuous, Kara Mead V, MD, Last Rate: 10 mL/hr at 09/14/22 0248, 250 mL at 09/14/22 0248   0.9 %  sodium chloride infusion, , Intravenous, PRN, Shawna Clamp, MD, Last Rate: 10 mL/hr at 09/24/22 1019, New Bag at 09/24/22 1019   acetaminophen (TYLENOL) tablet 500 mg, 500 mg, Oral, Q8H, Lavina Hamman, MD, 500 mg at 10/03/22 1313   acetaminophen (TYLENOL) tablet 650 mg, 650 mg, Oral, Q6H PRN, Candee Furbish, MD, 650 mg at 09/16/22 0957   atorvastatin (LIPITOR) tablet 80 mg, 80 mg, Oral, Daily, Ollis, Brandi L, NP, 80 mg at 10/03/22 0175   Chlorhexidine Gluconate Cloth 2 % PADS 6 each, 6 each, Topical, Daily, Alcario Drought, Jared M, DO, 6 each at 10/03/22 1314   clopidogrel (PLAVIX) tablet 75 mg, 75 mg, Oral, Daily, Barrett, Rhonda G, PA-C, 75 mg at 10/03/22 1025   diclofenac Sodium (VOLTAREN) 1 % topical gel 2 g, 2 g, Topical, TID PRN, Omar Person, NP, 2 g at 09/12/22 0207   finasteride (PROSCAR) tablet 5 mg, 5 mg, Oral, Daily, Marton Redwood III, MD, 5 mg at 10/03/22 0835   furosemide (LASIX) injection 20 mg, 20 mg, Intravenous, Once, Lavina Hamman, MD   gabapentin (NEURONTIN) capsule 300 mg, 300 mg, Oral, q1800, Mims, Lauren W, DO, 300 mg at 10/02/22 1716   gabapentin (NEURONTIN) capsule 300 mg, 300 mg, Oral, Daily, Mims, Lauren W, DO, 300 mg at 10/03/22 8527   hydrocortisone (CORTEF) tablet 20 mg, 20 mg, Oral, Daily, 20 mg at 10/03/22 0836 **AND** hydrocortisone (CORTEF) tablet 10 mg, 10 mg, Oral, Daily, Ayesha Rumpf, Stephanie M, PA-C, 10 mg at 10/02/22 2157   insulin aspart  (novoLOG) injection 0-5 Units, 0-5 Units, Subcutaneous, QHS, Julian Hy, DO, 2 Units at 09/29/22 2223   insulin aspart (novoLOG) injection 0-9 Units, 0-9 Units, Subcutaneous, TID WC, Dwyane Dee, MD, 1 Units at 10/03/22 1314   lidocaine (LIDODERM) 5 % 1 patch, 1 patch, Transdermal, Daily, [name], MD, 1 patch at 10/02/22 1012   melatonin tablet 5 mg, 5 mg, Oral, q1800, Mims, Lauren W, DO, 5 mg at 10/02/22 1716   midodrine (PROAMATINE) tablet 10 mg, 10 mg, Oral, TID with meals, Fay Records, MD, 10 mg at 10/03/22 1313   nystatin (MYCOSTATIN/NYSTOP) topical powder, , Topical, BID, Margaretha Seeds, MD, Given at 10/03/22 [phone]   Oral care mouth rinse, 15 mL, Mouth Rinse, PRN, Etta Quill, DO   oxyCODONE (Oxy IR/ROXICODONE) immediate release tablet 5 mg, 5 mg, Oral, Q4H PRN, Mims, Lauren W, DO, 5 mg at 10/02/22 2157   pantoprazole (PROTONIX) EC tablet 40 mg, 40 mg, Oral, Daily, Ollis, Brandi L, NP, 40 mg at 10/03/22 0833   polyvinyl alcohol (LIQUIFILM TEARS) 1.4 %  ophthalmic solution 1 drop, 1 drop, Both Eyes, PRN, [name], MD, 1 drop at 09/10/22 2140   sodium chloride flush (NS) 0.9 % injection 10-40 mL, 10-40 mL, Intracatheter, Q12H, Shawna Clamp, MD, 10 mL at 10/02/22 2205   sodium chloride flush (NS) 0.9 % injection 10-40 mL, 10-40 mL, Intracatheter, PRN, Shawna Clamp, MD   tamsulosin [facility]) capsule 0.4 mg, 0.4 mg, Oral, Daily, Alcario Drought, Jared M, DO, 0.4 mg at 10/03/22 8502   traZODone (DESYREL) tablet 50 mg, 50 mg, Oral, q1800, Mims, Lauren W, DO, 50 mg at 10/02/22 1716   zolpidem (AMBIEN) tablet 5 mg, 5 mg, Oral, q1800, Mims, Lauren W, DO, 5 mg at 10/02/22 1716:   acetaminophen  500 mg Oral Q8H   atorvastatin  80 mg Oral Daily   Chlorhexidine Gluconate Cloth  6 each Topical Daily   clopidogrel  75 mg Oral Daily   finasteride  5 mg Oral Daily   furosemide  20 mg Intravenous Once   gabapentin  300 mg Oral q1800   gabapentin  300 mg Oral Daily    hydrocortisone  20 mg Oral Daily   And   hydrocortisone  10 mg Oral Daily   insulin aspart  0-5 Units Subcutaneous QHS   insulin aspart  0-9 Units Subcutaneous TID WC   lidocaine  1 patch Transdermal Daily   melatonin  5 mg Oral q1800   midodrine  10 mg Oral TID with meals   nystatin   Topical BID   pantoprazole  40 mg Oral Daily   sodium chloride flush  10-40 mL Intracatheter Q12H   tamsulosin  0.4 mg Oral Daily   traZODone  50 mg Oral q1800   zolpidem  5 mg Oral q1800  :  No Known Allergies:  FH: Noncontributory  SOCIAL HISTORY: He lives alone in [place].  He is currently unemployed.  He previously worked in an office occupation.  No tobacco or alcohol use.  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   Physical Exam:  Blood pressure 106/74, pulse 86, temperature 98.3 F (36.8 C), temperature source Oral, resp. rate 18, height '6\' 4"'$  (1.93 m), weight 276 lb 3.8 oz (125.3 kg), SpO2 97 %.  HEENT: Oral cavity without visible mass, neck without mass Lungs: Decreased breath sounds at the right greater than left lower posterior chest, no respiratory distress Cardiac: Regular rate and rhythm, 2/6 systolic murmur Abdomen: No hepatosplenomegaly, no mass GU: The scrotum is erythematous and edematous Vascular: 1+ pitting edema throughout the right leg Lymph nodes: No cervical, supraclavicular, axillary, or inguinal nodes Neurologic: Alert and oriented, follows commands Skin: Scrotal erythema Musculoskeletal: Status post left BKA  LABS:   Recent Labs    10/02/22 0027 10/03/22 0243  WBC 5.5 4.7  HGB 7.5* 6.9*  HCT 25.9* 23.6*  PLT 67* 61*             Blood smear 10/01/2022: The platelets appear decreased in number.  Scattered large and rare giant platelet.  No platelet clumps.  Numerous ovalocytes, teardrops, and  acanthocytes.  Few "cigar "cells.  Few burr cells.  The polychromasia is increased.  Rare helmet cell/schistocytes.  No nucleated red cells.  The majority the white cells are mature appearing neutrophils and lymphocytes.  1 possible "blast ".  No monotonous white cell population.   Recent Labs  10/02/22 0027 10/03/22 0243  NA 136 136  K 4.0 4.0  CL 109 110  CO2 21* 21*  GLUCOSE 175* 146*  BUN 32* 34*  CREATININE 1.08 0.85  CALCIUM 8.2* 8.2*      RADIOLOGY: CT chest, abdomen, and pelvis 08/02/2022-lateral pleural effusions, no chest or abdominal lymphadenopathy no splenomegaly CT chest 09/02/2022-slightly prominent and stable mediastinal right hilar nodes  Assessment and Plan:   1.  Microcytic anemia 2.  Thrombocytopenia, normal platelet count on hospital admission 3.  History of ESBL E. coli and CRE Proteus, prostatitis, scrotal cellulitis-treated with Bactrim and 4-week course of ertapenem 4.  History of "prostate cancer "and BPH 5.  Hematuria on hospital admission-resolved, followed by urology 6.  Ischemic cardiomyopathy 7.  "Septic "and cardiogenic shock November 2023 8.   Diabetes 9.   Paroxysmal atrial fibrillation  Mr Malena is a complex medical history and has been hospitalized on several occasions over recent months.  He Was Diagnosed with ESBL E. coli and CRE Proteus at an outside hospital prior to this hospital admission.  He has also been treated for prostatitis and scrotal cellulitis.  He has been hospitalized since 08/31/2022.  I am asked to see him to evaluate anemia and thrombocytopenia.  I have a low clinical suspicion for a primary hematologic diagnosis.  The anemia is secondary to phlebotomy, chronic disease, sepsis syndrome, and iron deficiency.  He may have had low-grade DIC on hospital admission.  Iron deficiency is likely related to chronic GU blood loss while maintained on apixaban.  However there could be another source for blood loss.  The stool was  Hemoccult positive in September.  Thrombocytopenia is likely secondary to bone marrow suppression from sepsis, consumption, possible DIC, and polypharmacy.  The available laboratory studies and peripheral blood smear do not suggest TTP.  Platelet count appears to have stabilized over the past few days.  Recommendations: Begin iron replacement Limit blood draws as possible Consider GI consult for iron deficiency anemia and Hemoccult positive stool Management of BPH, prostate cancer, and UTI per urology Monitor platelet count to look for recovery Consider holding Plavix if the platelet count falls further Optimize nutrition, trial of vitamin K if the PT remains elevated Please call hematology as needed, I will follow-up on his platelet count tomorrow  Betsy Coder, MD 10/03/2022, 3:20 PM

## 2022-10-03 NOTE — Progress Notes (Signed)
[facility] Progress Note Patient: Peter Yang YQI:[id number] DOB: 1955/10/27 DOA: 08/31/2022  DOS: the patient was seen and examined on 10/03/2022  Brief hospital course: 66 year old man who presented to the [facility] ED 11/11 with a chief complaint of hematuria. PMHx significant for HTN, HLD, HFrEF/ICM EF 40-45%, CAD/NSTEMI, TIA, PAF (on Eliquis), T2DM, BPH (hx prostate CA). Patient with questionable length of time of hematuria which may have been going on since 10/13 per patient.  Sent to the emergency department by SNF staff on 11/11. 11/11 Admit by TRH, BC>, UC>, PCCM consult.  During his course patient has been seen by urology.  ID was also consulted patient was on IV antibiotics for 4 weeks.  Palliative care was consulted for symptom management and goals of care.  Assessment and Plan: Septic shock as well as cardiogenic shock and hypovolemic shock. Treated with IV antibiotics, IV pressors as well as midodrine blood pressure currently stable. Shock physiology resolved. Sepsis physiology resolved as well. Continue midodrine for now.  Acute on chronic HFrEF. Ischemic cardiomyopathy. CAD SP PCI. Significant concern for cardiogenic shock. Cardiology was consulted. Not a candidate for home inotropes, revascularization or transplant or LVAD. Unable to tolerate GDMT due to hypotension. Currently on Eliquis and Plavix. Continues to remain volume overloaded.  Will treat with IV Lasix after blood transfusion and followed by IV albumin and IV Lasix combination.  Prostatitis. Scrotal cellulitis. History of ESBL E. coli and CRE Proteus. Urology was consulted. ID was consulted. Patient was started on ertapenem for 4 weeks. PICC line currently in place. Antibiotic course completed as of 12/10. Discussed with ID.  No indication for inpatient follow-up for now as long as patient appears stable.  Hematuria. History of prostate cancer. H&H relatively stable. No further hematuria  seen. Patient had a Foley catheter. Removed on 12/12. Patient able to void after that. Currently has a urostomy bag as the patient's scrotum is still swollen significantly.  Hyponatremia. Hypocalcemia. AKI. Resolved.  Monitor.  Type 2 diabetes mellitus, controlled with long-term insulin use. Continue sliding scale insulin as well as long-acting insulin. Hemoglobin A1c was 6.6 in October.  Deconditioning. PT recommends SNF. Social worker currently working.  Chronic stage II sacral ulcer. Present on admission. Continue wound care.  Left lower extremity phantom pain. Insomnia. On gabapentin, melatonin, trazodone, Ambien.  New onset thrombocytopenia. At baseline platelet counts are normal. Platelet count trends down.  Hematology consulted.  Recommend supportive measures for now. No active bleeding reported by the patient. Currently holding Eliquis.  Await platelet count more than 90,000 before resuming. With the platelet count drops below 40,000 would prefer to stop Plavix as well.  Anemia, normocytic. Likely secondary to multiple etiologies.  No active bleeding seen so far.  Therefore no indication for endoscopic evaluation for now. Outpatient hematology consultation as patient had some schistocytes on the smear. Currently recommend supportive measures.  Obesity Body mass index is 33.62 kg/m.  Placing the pt at higher risk of poor outcomes.   Subjective: Pain well-controlled.  No nausea no vomiting no fever no chills.  Physical Exam: General: Appear in mild distress; Cardiovascular: S1 and S2 Present, no Murmur, Respiratory: good respiratory effort, Bilateral Air entry present, faint basal crackles, no wheezes Abdomen: Bowel Sound present, Non tender  Extremities: bilateral  Pedal edema Neurology: alert and oriented to time, place, and person   Data Reviewed: I have Reviewed nursing notes, Vitals, and Lab results. Since last encounter, pertinent lab results CBC and  BMP   . I have ordered  test including CBC and BMP  . I have discussed pt's care plan and test results with hematology  .    Disposition: Status is: Inpatient Remains inpatient appropriate because: Needs inpatient stay due to worsening thrombo-cytopenia and anemia  Place and maintain sequential compression device Start: 10/02/22 1444 SCDs Start: 08/31/22 1944   Family Communication: No one at bedside Level of care: Telemetry continue telemetry for hypotension. Vitals:   10/03/22 1141 10/03/22 1255 10/03/22 1325 10/03/22 1555  BP: 93/73 (!) 97/53 106/74 104/66  Pulse: 72 81 86 79  Resp: '18  18 17  '$ Temp: 98.2 F (36.8 C) 97.7 F (36.5 C) 98.3 F (36.8 C) 98 F (36.7 C)  TempSrc: Oral Oral Oral Oral  SpO2: 99% 97% 97%   Weight:      Height:         Author: Berle Mull, MD 10/03/2022 7:03 PM  Please look on [email or web address] to find out who is on call.

## 2022-10-04 DIAGNOSIS — E1140 Type 2 diabetes mellitus with diabetic neuropathy, unspecified: Secondary | ICD-10-CM | POA: Diagnosis not present

## 2022-10-04 DIAGNOSIS — R310 Gross hematuria: Secondary | ICD-10-CM | POA: Diagnosis not present

## 2022-10-04 DIAGNOSIS — D509 Iron deficiency anemia, unspecified: Secondary | ICD-10-CM | POA: Diagnosis not present

## 2022-10-04 DIAGNOSIS — I10 Essential (primary) hypertension: Secondary | ICD-10-CM | POA: Diagnosis not present

## 2022-10-04 DIAGNOSIS — D696 Thrombocytopenia, unspecified: Secondary | ICD-10-CM | POA: Diagnosis not present

## 2022-10-04 LAB — CBC WITH DIFFERENTIAL/PLATELET
Abs Immature Granulocytes: 0.03 10*3/uL (ref 0.00–0.07)
Basophils Absolute: 0 10*3/uL (ref 0.0–0.1)
Basophils Relative: 0 %
Eosinophils Absolute: 0.2 10*3/uL (ref 0.0–0.5)
Eosinophils Relative: 3 %
HCT: 27.5 % — ABNORMAL LOW (ref 39.0–52.0)
Hemoglobin: 8.2 g/dL — ABNORMAL LOW (ref 13.0–17.0)
Immature Granulocytes: 1 %
Lymphocytes Relative: 20 %
Lymphs Abs: 1.1 10*3/uL (ref 0.7–4.0)
MCH: 23.2 pg — ABNORMAL LOW (ref 26.0–34.0)
MCHC: 29.8 g/dL — ABNORMAL LOW (ref 30.0–36.0)
MCV: 77.9 fL — ABNORMAL LOW (ref 80.0–100.0)
Monocytes Absolute: 0.7 10*3/uL (ref 0.1–1.0)
Monocytes Relative: 12 %
Neutro Abs: 3.6 10*3/uL (ref 1.7–7.7)
Neutrophils Relative %: 64 %
Platelets: 65 10*3/uL — ABNORMAL LOW (ref 150–400)
RBC: 3.53 MIL/uL — ABNORMAL LOW (ref 4.22–5.81)
RDW: 17.7 % — ABNORMAL HIGH (ref 11.5–15.5)
WBC: 5.6 10*3/uL (ref 4.0–10.5)
nRBC: 0 % (ref 0.0–0.2)

## 2022-10-04 LAB — BASIC METABOLIC PANEL
Anion gap: 8 (ref 5–15)
BUN: 32 mg/dL — ABNORMAL HIGH (ref 8–23)
CO2: 20 mmol/L — ABNORMAL LOW (ref 22–32)
Calcium: 8.2 mg/dL — ABNORMAL LOW (ref 8.9–10.3)
Chloride: 107 mmol/L (ref 98–111)
Creatinine, Ser: 0.96 mg/dL (ref 0.61–1.24)
GFR, Estimated: >60 mL/min (ref >60)
Glucose, Bld: 187 mg/dL — ABNORMAL HIGH (ref 70–99)
Potassium: 3.6 mmol/L (ref 3.5–5.1)
Sodium: 135 mmol/L (ref 135–145)

## 2022-10-04 LAB — BPAM RBC
Blood Product Expiration Date: 202401122359
ISSUE DATE / TIME: 202312141300
Unit Type and Rh: 6200

## 2022-10-04 LAB — GLUCOSE, CAPILLARY
Glucose-Capillary: 137 mg/dL — ABNORMAL HIGH (ref 70–99)
Glucose-Capillary: 126 mg/dL — ABNORMAL HIGH (ref 70–99)
Glucose-Capillary: 184 mg/dL — ABNORMAL HIGH (ref 70–99)
Glucose-Capillary: 173 mg/dL — ABNORMAL HIGH (ref 70–99)

## 2022-10-04 LAB — TYPE AND SCREEN
ABO/RH(D): A POS
Antibody Screen: NEGATIVE
Unit division: 0

## 2022-10-04 LAB — OCCULT BLOOD X 1 CARD TO LAB, STOOL: Fecal Occult Bld: NEGATIVE

## 2022-10-04 LAB — MAGNESIUM: Magnesium: 1.8 mg/dL (ref 1.7–2.4)

## 2022-10-04 MED ORDER — B COMPLEX-C PO TABS
1.0000 | ORAL_TABLET | Freq: Every day | ORAL | Status: DC
  Administered 2022-10-04 – 2022-10-24 (×21): 1 via ORAL
  Filled 2022-10-04 (×17): qty 1

## 2022-10-04 MED ORDER — ALBUMIN HUMAN 25 % IV SOLN
12.5000 g | Freq: Once | INTRAVENOUS | Status: AC
  Administered 2022-10-04: 12.5 g via INTRAVENOUS
  Filled 2022-10-04: qty 50

## 2022-10-04 MED ORDER — VITAMIN B-12 1000 MCG PO TABS
1000.0000 ug | ORAL_TABLET | Freq: Every day | ORAL | Status: DC
  Administered 2022-10-05 – 2022-10-24 (×20): 1000 ug via ORAL
  Filled 2022-10-04 (×20): qty 1

## 2022-10-04 MED ORDER — BISACODYL 10 MG RE SUPP: 10.0000 mg | Freq: Every day | RECTAL | Status: DC | PRN

## 2022-10-04 MED ORDER — BISACODYL 10 MG RE SUPP
10.0000 mg | Freq: Once | RECTAL | Status: AC
  Administered 2022-10-04: 10 mg via RECTAL
  Filled 2022-10-04: qty 1

## 2022-10-04 MED ORDER — FUROSEMIDE 10 MG/ML IJ SOLN
40.0000 mg | Freq: Once | INTRAMUSCULAR | Status: AC
  Administered 2022-10-04: 40 mg via INTRAVENOUS
  Filled 2022-10-04: qty 4

## 2022-10-04 MED ORDER — POLYETHYLENE GLYCOL 3350 17 G PO PACK: 17.0000 g | PACK | Freq: Every day | ORAL | Status: DC | PRN

## 2022-10-04 MED ORDER — SODIUM CHLORIDE 0.9 % IV SOLN
250.0000 mg | Freq: Every day | INTRAVENOUS | Status: AC
  Administered 2022-10-04 – 2022-10-05 (×2): 250 mg via INTRAVENOUS
  Filled 2022-10-04 (×2): qty 20

## 2022-10-04 MED ORDER — SENNOSIDES-DOCUSATE SODIUM 8.6-50 MG PO TABS
1.0000 | ORAL_TABLET | Freq: Two times a day (BID) | ORAL | Status: DC
  Administered 2022-10-04 – 2022-10-24 (×8): 1 via ORAL
  Filled 2022-10-04 (×28): qty 1

## 2022-10-04 MED ORDER — CYANOCOBALAMIN 1000 MCG/ML IJ SOLN
1000.0000 ug | Freq: Once | INTRAMUSCULAR | Status: AC
  Administered 2022-10-04: 1000 ug via SUBCUTANEOUS
  Filled 2022-10-04: qty 1

## 2022-10-04 MED ORDER — FERROUS SULFATE 325 (65 FE) MG PO TABS
325.0000 mg | ORAL_TABLET | Freq: Every day | ORAL | Status: DC
  Administered 2022-10-06 – 2022-10-24 (×19): 325 mg via ORAL
  Filled 2022-10-04 (×20): qty 1

## 2022-10-04 MED ORDER — PHYTONADIONE 5 MG PO TABS
10.0000 mg | ORAL_TABLET | Freq: Every day | ORAL | Status: AC
  Administered 2022-10-04 – 2022-10-06 (×3): 10 mg via ORAL
  Filled 2022-10-04 (×3): qty 2

## 2022-10-04 NOTE — Progress Notes (Addendum)
IP PROGRESS NOTE  Subjective:   No complaint.  No bleeding.  Objective: Vital signs in last 24 hours: Blood pressure 98/68, pulse 73, temperature 97.8 F (36.6 C), temperature source Oral, resp. rate 16, height '6\' 4"'$  (1.93 m), weight 277 lb 1.9 oz (125.7 kg), SpO2 99 %.  Intake/Output from previous day: 12/14 0701 - 12/15 0700 In: 840.8 [P.O.:480; I.V.:30.8; Blood:330] Out: 2150 [Urine:2150]  Physical Exam:  HEENT: No thrush Abdomen: No hepatosplenomegaly GU: Scrotal edema Vascular: Anasarca  Portacath/PICC-without erythema  Lab Results: Recent Labs    10/03/22 1825 10/04/22 0411  WBC 5.5 5.6  HGB 8.0* 8.2*  HCT 27.1* 27.5*  PLT 65* 65*    BMET Recent Labs    10/02/22 0027 10/03/22 0243  NA 136 136  K 4.0 4.0  CL 109 110  CO2 21* 21*  GLUCOSE 175* 146*  BUN 32* 34*  CREATININE 1.08 0.85  CALCIUM 8.2* 8.2*    No results found for: "CEA1", "CEA", "LDJ570", "CA125"  Studies/Results: No results found.  Medications: I have reviewed the patient's current medications.  Mr Henslee is unchanged.  The blood count and hemoglobin are stable today.  I recommend beginning iron replacement and repeating a CBC in a few days.  Assessment/Plan:  Microcytic anemia-iron deficiency and multifactorial 2.  Thrombocytopenia, normal platelet count on hospital admission 3.  History of ESBL E. coli and CRE Proteus, prostatitis, scrotal cellulitis-treated with Bactrim and 4-week course of ertapenem 4.  History of "prostate cancer "and BPH 5.  Hematuria on hospital admission-resolved, followed by urology 6.  Ischemic cardiomyopathy 7.  "Septic "and cardiogenic shock November 2023 8.   Diabetes 9.   Paroxysmal atrial fibrillation   Recommendations: Iron replacement Limit blood draws as possible Check CBC and PT/INR in 2-3 days Repeat stool Hemoccult, consider GI consult Trial of vitamin K Please call hematology as needed over the weekend, I will check on him  10/07/2022   LOS: 64 days   Betsy Coder, MD   10/04/2022, 1:48 PM

## 2022-10-04 NOTE — Progress Notes (Signed)
[facility] Progress Note Patient: Peter Yang BSW:[id number] DOB: 1956/07/10 DOA: 08/31/2022  DOS: the patient was seen and examined on 10/04/2022  Brief hospital course: 66 year old man who presented to the [facility] ED 11/11 with a chief complaint of hematuria. PMHx significant for HTN, HLD, HFrEF/ICM EF 40-45%, CAD/NSTEMI, TIA, PAF (on Eliquis), T2DM, BPH (hx prostate CA). Patient with questionable length of time of hematuria which may have been going on since 10/13 per patient.  Sent to the emergency department by SNF staff on 11/11. 11/11 Admit by TRH, BC>, UC>, PCCM consult.  During his course patient has been seen by urology.  ID was also consulted patient was on IV antibiotics for 4 weeks.  Palliative care was consulted for symptom management and goals of care.  Assessment and Plan: Septic shock as well as cardiogenic shock and hypovolemic shock. Treated with IV antibiotics, IV pressors as well as midodrine blood pressure currently stable. Shock physiology resolved. Sepsis physiology resolved as well. Continue midodrine for now.  Acute on chronic HFrEF. Ischemic cardiomyopathy. CAD SP PCI. Significant concern for cardiogenic shock. Cardiology was consulted. Not a candidate for home inotropes, revascularization or transplant or LVAD. Unable to tolerate GDMT due to hypotension. Currently on Eliquis and Plavix. Continues to remain volume overloaded.  Responding well to IV Lasix. Will reattempt tomorrow.  Prostatitis. Scrotal cellulitis. History of ESBL E. coli and CRE Proteus. Urology was consulted. ID was consulted. Patient was started on ertapenem for 4 weeks. PICC line currently in place. Antibiotic course completed as of 12/10. Discussed with ID.  No indication for inpatient follow-up for now as long as patient appears stable.  Hematuria. History of prostate cancer. H&H relatively stable. No further hematuria seen. Patient had a Foley catheter. Removed on  12/12. Patient able to void after that. Currently has a urostomy bag as the patient's scrotum is still swollen significantly.  Hyponatremia. Hypocalcemia. AKI. Resolved.  Monitor.  Type 2 diabetes mellitus, controlled with long-term insulin use. Continue sliding scale insulin as well as long-acting insulin. Hemoglobin A1c was 6.6 in October.  Deconditioning. PT recommends SNF. Social worker currently working.  Chronic stage II sacral ulcer. Present on admission. Continue wound care.  Left lower extremity phantom pain. Insomnia. On gabapentin, melatonin, trazodone, Ambien.  New onset thrombocytopenia. At baseline platelet counts are normal. Platelet count trends down.  Hematology consulted.  Recommend supportive measures for now. No active bleeding reported by the patient. Currently holding Eliquis.  Await platelet count more than 90,000 before resuming. With the platelet count drops below 40,000 would prefer to stop Plavix as well.  Anemia, normocytic. Likely secondary to multiple etiologies.  No active bleeding seen so far.  Therefore no indication for endoscopic evaluation for now. Outpatient hematology consultation as patient had some schistocytes on the smear. Currently recommend supportive measures. Started on IV iron therapy as well as G66 and folic acid.  Obesity Body mass index is 33.73 kg/m.  Placing the pt at higher risk of poor outcomes.  Constipation. Initiate bowel regimen.   Subjective: Reports the patient.  No nausea no vomiting no fever no chills.  Physical Exam: General: Appear in mild distress; Cardiovascular: S1 and S2 Present, no Murmur, Respiratory: good respiratory effort, Bilateral Air entry present, CTA, no Crackles, no wheezes Abdomen: Bowel Sound present, Non tender  Extremities: Significant anasarca, improving pedal edema Neurology: alert and oriented to time, place, and person   Data Reviewed: I have Reviewed nursing notes, Vitals,  and Lab results. Since last encounter, pertinent lab  results CBC and BMP   . I have ordered test including CBC and BMP  .     Disposition: Status is: Inpatient Remains inpatient appropriate because: Monitor.  Platelet count and anemia as well as need treatment for anasarca  Place and maintain sequential compression device Start: 10/02/22 1444 SCDs Start: 08/31/22 1944   Family Communication: No one at bedside Level of care: Telemetry continue telemetry for hypotension. Vitals:   10/03/22 1555 10/03/22 2018 10/04/22 0500 10/04/22 1211  BP: 104/66 116/77 118/82 98/68  Pulse: 79 75 76 73  Resp: 17   16  Temp: 98 F (36.7 C) 98.1 F (36.7 C) 98.9 F (37.2 C) 97.8 F (36.6 C)  TempSrc: Oral Oral Oral Oral  SpO2:  98%  99%  Weight:   125.7 kg   Height:         Author: Berle Mull, MD 10/04/2022 7:24 PM  Please look on [email or web address] to find out who is on call.

## 2022-10-05 DIAGNOSIS — R310 Gross hematuria: Secondary | ICD-10-CM | POA: Diagnosis not present

## 2022-10-05 LAB — BASIC METABOLIC PANEL
Anion gap: 8 (ref 5–15)
BUN: 30 mg/dL — ABNORMAL HIGH (ref 8–23)
CO2: 22 mmol/L (ref 22–32)
Calcium: 8.1 mg/dL — ABNORMAL LOW (ref 8.9–10.3)
Chloride: 106 mmol/L (ref 98–111)
Creatinine, Ser: 0.96 mg/dL (ref 0.61–1.24)
GFR, Estimated: >60 mL/min (ref >60)
Glucose, Bld: 153 mg/dL — ABNORMAL HIGH (ref 70–99)
Potassium: 3.3 mmol/L — ABNORMAL LOW (ref 3.5–5.1)
Sodium: 136 mmol/L (ref 135–145)

## 2022-10-05 LAB — CBC WITH DIFFERENTIAL/PLATELET
Abs Immature Granulocytes: 0.01 10*3/uL (ref 0.00–0.07)
Basophils Absolute: 0 10*3/uL (ref 0.0–0.1)
Basophils Relative: 0 %
Eosinophils Absolute: 0.3 10*3/uL (ref 0.0–0.5)
Eosinophils Relative: 5 %
HCT: 27 % — ABNORMAL LOW (ref 39.0–52.0)
Hemoglobin: 8.1 g/dL — ABNORMAL LOW (ref 13.0–17.0)
Immature Granulocytes: 0 %
Lymphocytes Relative: 14 %
Lymphs Abs: 0.9 10*3/uL (ref 0.7–4.0)
MCH: 23 pg — ABNORMAL LOW (ref 26.0–34.0)
MCHC: 30 g/dL (ref 30.0–36.0)
MCV: 76.7 fL — ABNORMAL LOW (ref 80.0–100.0)
Monocytes Absolute: 0.8 10*3/uL (ref 0.1–1.0)
Monocytes Relative: 13 %
Neutro Abs: 4.1 10*3/uL (ref 1.7–7.7)
Neutrophils Relative %: 68 %
Platelets: 71 10*3/uL — ABNORMAL LOW (ref 150–400)
RBC: 3.52 MIL/uL — ABNORMAL LOW (ref 4.22–5.81)
RDW: 18.5 % — ABNORMAL HIGH (ref 11.5–15.5)
WBC: 6 10*3/uL (ref 4.0–10.5)
nRBC: 0 % (ref 0.0–0.2)

## 2022-10-05 LAB — MAGNESIUM: Magnesium: 1.9 mg/dL (ref 1.7–2.4)

## 2022-10-05 LAB — GLUCOSE, CAPILLARY
Glucose-Capillary: 130 mg/dL — ABNORMAL HIGH (ref 70–99)
Glucose-Capillary: 136 mg/dL — ABNORMAL HIGH (ref 70–99)
Glucose-Capillary: 210 mg/dL — ABNORMAL HIGH (ref 70–99)
Glucose-Capillary: 172 mg/dL — ABNORMAL HIGH (ref 70–99)

## 2022-10-05 LAB — HAPTOGLOBIN: Haptoglobin: 105 mg/dL (ref 32–363)

## 2022-10-05 MED ORDER — POTASSIUM CHLORIDE CRYS ER 20 MEQ PO TBCR
40.0000 meq | EXTENDED_RELEASE_TABLET | ORAL | Status: AC
  Administered 2022-10-05 (×2): 40 meq via ORAL
  Filled 2022-10-05 (×2): qty 2

## 2022-10-05 MED ORDER — ALBUMIN HUMAN 25 % IV SOLN
12.5000 g | Freq: Once | INTRAVENOUS | Status: AC
  Administered 2022-10-05: 12.5 g via INTRAVENOUS
  Filled 2022-10-05: qty 50

## 2022-10-05 MED ORDER — FUROSEMIDE 10 MG/ML IJ SOLN
40.0000 mg | Freq: Once | INTRAMUSCULAR | Status: DC
  Filled 2022-10-05: qty 4

## 2022-10-05 NOTE — Progress Notes (Signed)
[facility] Progress Note Patient: Peter Yang [id number]:[id number] DOB: 12/15/1955 DOA: 08/31/2022  DOS: the patient was seen and examined on 10/05/2022  Brief hospital course: 66 year old man who presented to the [facility] ED 11/11 with a chief complaint of hematuria. PMHx significant for HTN, HLD, HFrEF/ICM EF 40-45%, CAD/NSTEMI, TIA, PAF (on Eliquis), T2DM, BPH (hx prostate CA). Patient with questionable length of time of hematuria which may have been going on since 10/13 per patient.  Sent to the emergency department by SNF staff on 11/11. 11/11 Admit by TRH, BC>, UC>, PCCM consult.  During his course patient has been seen by urology.  ID was also consulted patient was on IV antibiotics for 4 weeks.  Palliative care was consulted for symptom management and goals of care.  Assessment and Plan: Septic shock as well as cardiogenic shock and hypovolemic shock. Treated with IV antibiotics, IV pressors as well as midodrine blood pressure currently stable. Shock physiology resolved. Sepsis physiology resolved as well. Continue midodrine for now.  Acute on chronic HFrEF. Ischemic cardiomyopathy. CAD SP PCI. Significant concern for cardiogenic shock. Cardiology was consulted. Not a candidate for home inotropes, revascularization or transplant or LVAD. Unable to tolerate GDMT due to hypotension. Currently on Eliquis and Plavix. Continues to remain volume overloaded.  Responding well to IV Lasix. Due to hypotension providing 2 doses of IV albumin followed by IV Lasix.  Prostatitis. Scrotal cellulitis. History of ESBL E. coli and CRE Proteus. Urology was consulted. ID was consulted. Patient was started on ertapenem for 4 weeks. PICC line currently in place. Antibiotic course completed as of 12/10. Discussed with ID.  No indication for inpatient follow-up for now as long as patient appears stable.  Hematuria. History of prostate cancer. H&H relatively stable. No further hematuria  seen. Patient had a Foley catheter. Removed on 12/12. Patient able to void after that. Currently has a urostomy bag as the patient's scrotum is still swollen significantly.  Hyponatremia. Hypocalcemia. AKI. Resolved.  Monitor.  Type 2 diabetes mellitus, controlled with long-term insulin use. Continue sliding scale insulin as well as long-acting insulin. Hemoglobin A1c was 6.6 in October.  Deconditioning. PT recommends SNF. Social worker currently working.  Chronic stage II sacral ulcer. Present on admission. Continue wound care.  Left lower extremity phantom pain. Insomnia. On gabapentin, melatonin, trazodone, Ambien.  New onset thrombocytopenia. At baseline platelet counts are normal. Platelet count trends down.  Hematology consulted.  Recommend supportive measures for now. No active bleeding reported by the patient. Currently holding Eliquis.  Await platelet count more than 90,000 before resuming. With the platelet count drops below 40,000 would prefer to stop Plavix as well.  Anemia, normocytic. Likely secondary to multiple etiologies.  No active bleeding seen so far.  Therefore no indication for endoscopic evaluation for now. Outpatient hematology consultation as patient had some schistocytes on the smear. Currently recommend supportive measures. Started on IV iron therapy as well as O67 and folic acid.  Obesity Body mass index is 32.42 kg/m.  Placing the pt at higher risk of poor outcomes.  Constipation. Initiate bowel regimen.   Subjective: No nausea no vomiting.  No acute complaint.  No fever no chills.  Patient has not decided regarding his preference for an SNF.  Physical Exam: Appears in mild distress. S1-S2 present. Bowel sound present.  Significant edema on the all extremities improving.  Data Reviewed: I have Reviewed nursing notes, Vitals, and Lab results. CBC shows stable hemoglobin.  BMP stable. Will recheck CBC and BMP tomorrow.  Disposition: Status is: Inpatient Remains inpatient appropriate because: Continue IV Lasix for treatment for anasarca.  Place and maintain sequential compression device Start: 10/02/22 1444 SCDs Start: 08/31/22 1944   Family Communication: No one at bedside Level of care: Telemetry continue telemetry for hypotension. Vitals:   10/05/22 0337 10/05/22 0338 10/05/22 1032 10/05/22 1300  BP: 90/60  (!) 88/58   Pulse: 69     Resp: 18   11  Temp: 97.7 F (36.5 C)   98.4 F (36.9 C)  TempSrc: Oral   Oral  SpO2: 100%   100%  Weight:  120.8 kg    Height:         Author: Berle Mull, MD 10/05/2022 6:05 PM  Please look on [email or web address] to find out who is on call.

## 2022-10-06 DIAGNOSIS — R310 Gross hematuria: Secondary | ICD-10-CM | POA: Diagnosis not present

## 2022-10-06 LAB — BASIC METABOLIC PANEL
Anion gap: 6 (ref 5–15)
BUN: 30 mg/dL — ABNORMAL HIGH (ref 8–23)
CO2: 21 mmol/L — ABNORMAL LOW (ref 22–32)
Calcium: 8.2 mg/dL — ABNORMAL LOW (ref 8.9–10.3)
Chloride: 110 mmol/L (ref 98–111)
Creatinine, Ser: 0.97 mg/dL (ref 0.61–1.24)
GFR, Estimated: >60 mL/min (ref >60)
Glucose, Bld: 226 mg/dL — ABNORMAL HIGH (ref 70–99)
Potassium: 3.8 mmol/L (ref 3.5–5.1)
Sodium: 137 mmol/L (ref 135–145)

## 2022-10-06 LAB — CBC WITH DIFFERENTIAL/PLATELET
Abs Immature Granulocytes: 0.02 10*3/uL (ref 0.00–0.07)
Basophils Absolute: 0 10*3/uL (ref 0.0–0.1)
Basophils Relative: 1 %
Eosinophils Absolute: 0.3 10*3/uL (ref 0.0–0.5)
Eosinophils Relative: 7 %
HCT: 27.1 % — ABNORMAL LOW (ref 39.0–52.0)
Hemoglobin: 7.7 g/dL — ABNORMAL LOW (ref 13.0–17.0)
Immature Granulocytes: 1 %
Lymphocytes Relative: 20 %
Lymphs Abs: 0.9 10*3/uL (ref 0.7–4.0)
MCH: 22.4 pg — ABNORMAL LOW (ref 26.0–34.0)
MCHC: 28.4 g/dL — ABNORMAL LOW (ref 30.0–36.0)
MCV: 78.8 fL — ABNORMAL LOW (ref 80.0–100.0)
Monocytes Absolute: 0.7 10*3/uL (ref 0.1–1.0)
Monocytes Relative: 15 %
Neutro Abs: 2.5 10*3/uL (ref 1.7–7.7)
Neutrophils Relative %: 56 %
Platelets: 71 10*3/uL — ABNORMAL LOW (ref 150–400)
RBC: 3.44 MIL/uL — ABNORMAL LOW (ref 4.22–5.81)
RDW: 18.5 % — ABNORMAL HIGH (ref 11.5–15.5)
WBC: 4.4 10*3/uL (ref 4.0–10.5)
nRBC: 0 % (ref 0.0–0.2)

## 2022-10-06 LAB — GLUCOSE, CAPILLARY
Glucose-Capillary: 150 mg/dL — ABNORMAL HIGH (ref 70–99)
Glucose-Capillary: 126 mg/dL — ABNORMAL HIGH (ref 70–99)
Glucose-Capillary: 184 mg/dL — ABNORMAL HIGH (ref 70–99)
Glucose-Capillary: 208 mg/dL — ABNORMAL HIGH (ref 70–99)

## 2022-10-06 LAB — ADAMTS13 ACTIVITY REFLEX

## 2022-10-06 LAB — MAGNESIUM: Magnesium: 2 mg/dL (ref 1.7–2.4)

## 2022-10-06 LAB — ADAMTS13 ACTIVITY: Adamts 13 Activity: 51.7 % — ABNORMAL LOW (ref >66.8)

## 2022-10-06 MED ORDER — APIXABAN 5 MG PO TABS
5.0000 mg | ORAL_TABLET | Freq: Two times a day (BID) | ORAL | Status: DC
  Administered 2022-10-07 – 2022-10-24 (×35): 5 mg via ORAL
  Filled 2022-10-06 (×35): qty 1

## 2022-10-06 MED ORDER — FUROSEMIDE 10 MG/ML IJ SOLN
40.0000 mg | Freq: Once | INTRAMUSCULAR | Status: AC
  Administered 2022-10-06: 40 mg via INTRAVENOUS
  Filled 2022-10-06: qty 4

## 2022-10-06 NOTE — Progress Notes (Signed)
[facility] Progress Note Patient: Peter Yang [id number]:[id number] DOB: July 01, 1956 DOA: 08/31/2022  DOS: the patient was seen and examined on 10/06/2022  Brief hospital course: 66 year old man who presented to the [facility] ED 11/11 with a chief complaint of hematuria. PMHx significant for HTN, HLD, HFrEF/ICM EF 40-45%, CAD/NSTEMI, TIA, PAF (on Eliquis), T2DM, BPH (hx prostate CA). Patient with questionable length of time of hematuria which may have been going on since 10/13 per patient.  Sent to the emergency department by SNF staff on 11/11. 11/11 Admit by TRH, BC>, UC>, PCCM consult.  During his course patient has been seen by urology.  ID was also consulted patient was on IV antibiotics for 4 weeks.  Palliative care was consulted for symptom management and goals of care.  Assessment and Plan: Septic shock as well as cardiogenic shock and hypovolemic shock. Treated with IV antibiotics, IV pressors as well as midodrine blood pressure currently stable. Shock physiology resolved. Sepsis physiology resolved as well. Continue midodrine for now.  Acute on chronic HFrEF. Ischemic cardiomyopathy. CAD SP PCI. Significant concern for cardiogenic shock. Cardiology was consulted. Not a candidate for home inotropes, revascularization or transplant or LVAD. Unable to tolerate GDMT due to hypotension. Currently on Eliquis and Plavix. Continues to remain volume overloaded.  Responding well to IV Lasix and albumin combination. Still has significant scrotal swelling which places the patient at high risk for recurrent scrotal cellulitis and therefore we will continue with aggressive diuresis as long as his blood pressure supports.  Prostatitis. Scrotal cellulitis. History of ESBL E. coli and CRE Proteus. Urology was consulted. ID was consulted. Patient was started on ertapenem for 4 weeks. PICC line currently in place. Antibiotic course completed as of 12/10. Discussed with ID.  No indication for  inpatient follow-up for now as long as patient appears stable.  Hematuria. History of prostate cancer. H&H relatively stable. No further hematuria seen. Patient had a Foley catheter. Removed on 12/12. Patient able to void after that. Currently has a urostomy bag as the patient's scrotum is still swollen significantly.  Hyponatremia. Hypocalcemia. AKI. Resolved.  Monitor.  Type 2 diabetes mellitus, controlled with long-term insulin use. Continue sliding scale insulin as well as long-acting insulin. Hemoglobin A1c was 6.6 in October.  Deconditioning. PT recommends SNF. Social worker currently working.  Chronic stage II sacral ulcer. Present on admission. Continue wound care.  Left lower extremity phantom pain. Insomnia. On gabapentin, melatonin, trazodone, Ambien.  New onset thrombocytopenia. At baseline platelet counts are normal. Platelet count trends down.  Hematology consulted.  Recommend supportive measures for now. No active bleeding reported by the patient. Currently holding Eliquis.  Await platelet count more than 90,000 before resuming. With the platelet count drops below 40,000 would prefer to stop Plavix as well. Adam TS 13 marginally low.  Platelet count have stabilized.  Monitor.  Anemia, normocytic. Likely secondary to multiple etiologies.  No active bleeding seen so far.  Therefore no indication for endoscopic evaluation for now. Outpatient hematology consultation as patient had some schistocytes on the smear. Currently recommend supportive measures. Started on IV iron therapy as well as G99 and folic acid. Hemoglobin less than 8.  If persistently remains low with hypotension we will provide another transfusion followed by IV Lasix.  Obesity Body mass index is 32.74 kg/m.  Placing the pt at higher risk of poor outcomes.  Constipation. Initiate bowel regimen.   Subjective: Denies any complaint.  No nausea or vomiting.  Reports  constipation.  Physical Exam: General: Appear in  mild distress; Cardiovascular: S1 and S2 Present, no Murmur, Respiratory: good respiratory effort, Bilateral Air entry present, CTA, no Crackles, no wheezes Abdomen: Bowel Sound present, Non tender  Extremities: Diffuse anasarca, bilateral pedal edema, scrotal edema with redness Neurology: alert and oriented to time, place, and person   Data Reviewed: I have Reviewed nursing notes, Vitals, and Lab results. Since last encounter, pertinent lab results CBC and BMP   . I have ordered test including CBC and BMP  .   Disposition: Status is: Inpatient Remains inpatient appropriate because: Continue IV Lasix for treatment for anasarca.  Place and maintain sequential compression device Start: 10/02/22 1444 SCDs Start: 08/31/22 1944 apixaban (ELIQUIS) tablet 5 mg   Family Communication: No one at bedside Level of care: Telemetry continue telemetry for hypotension. Vitals:   10/06/22 0500 10/06/22 0531 10/06/22 1319 10/06/22 2030  BP:  1'27/80 91/61 94/67 '$  Pulse:  72 86 77  Resp:  '16 18 20  '$ Temp:  98.1 F (36.7 C) 99 F (37.2 C) 98.5 F (36.9 C)  TempSrc:  Oral Oral Oral  SpO2:  98% 98% 99%  Weight: 122 kg     Height:         Author: Berle Mull, MD 10/06/2022 8:36 PM  Please look on [email or web address] to find out who is on call.

## 2022-10-06 NOTE — Plan of Care (Signed)
  Problem: Nutritional: Goal: Maintenance of adequate nutrition will improve Outcome: Progressing   

## 2022-10-07 DIAGNOSIS — D696 Thrombocytopenia, unspecified: Secondary | ICD-10-CM | POA: Diagnosis not present

## 2022-10-07 DIAGNOSIS — D509 Iron deficiency anemia, unspecified: Secondary | ICD-10-CM | POA: Diagnosis not present

## 2022-10-07 DIAGNOSIS — R310 Gross hematuria: Secondary | ICD-10-CM | POA: Diagnosis not present

## 2022-10-07 DIAGNOSIS — E1140 Type 2 diabetes mellitus with diabetic neuropathy, unspecified: Secondary | ICD-10-CM | POA: Diagnosis not present

## 2022-10-07 DIAGNOSIS — I10 Essential (primary) hypertension: Secondary | ICD-10-CM | POA: Diagnosis not present

## 2022-10-07 LAB — FIBRINOGEN: Fibrinogen: 296 mg/dL (ref 210–475)

## 2022-10-07 LAB — CBC WITH DIFFERENTIAL/PLATELET
Abs Immature Granulocytes: 0.03 10*3/uL (ref 0.00–0.07)
Basophils Absolute: 0 10*3/uL (ref 0.0–0.1)
Basophils Relative: 1 %
Eosinophils Absolute: 0.3 10*3/uL (ref 0.0–0.5)
Eosinophils Relative: 7 %
HCT: 26.5 % — ABNORMAL LOW (ref 39.0–52.0)
Hemoglobin: 7.7 g/dL — ABNORMAL LOW (ref 13.0–17.0)
Immature Granulocytes: 1 %
Lymphocytes Relative: 21 %
Lymphs Abs: 0.9 10*3/uL (ref 0.7–4.0)
MCH: 23.2 pg — ABNORMAL LOW (ref 26.0–34.0)
MCHC: 29.1 g/dL — ABNORMAL LOW (ref 30.0–36.0)
MCV: 79.8 fL — ABNORMAL LOW (ref 80.0–100.0)
Monocytes Absolute: 0.7 10*3/uL (ref 0.1–1.0)
Monocytes Relative: 17 %
Neutro Abs: 2.3 10*3/uL (ref 1.7–7.7)
Neutrophils Relative %: 53 %
Platelets: 85 10*3/uL — ABNORMAL LOW (ref 150–400)
RBC: 3.32 MIL/uL — ABNORMAL LOW (ref 4.22–5.81)
RDW: 18.7 % — ABNORMAL HIGH (ref 11.5–15.5)
WBC: 4.3 10*3/uL (ref 4.0–10.5)
nRBC: 0 % (ref 0.0–0.2)

## 2022-10-07 LAB — PROTIME-INR
INR: 1.3 — ABNORMAL HIGH (ref 0.8–1.2)
Prothrombin Time: 16.5 seconds — ABNORMAL HIGH (ref 11.4–15.2)

## 2022-10-07 LAB — BASIC METABOLIC PANEL
Anion gap: 5 (ref 5–15)
BUN: 29 mg/dL — ABNORMAL HIGH (ref 8–23)
CO2: 23 mmol/L (ref 22–32)
Calcium: 8.2 mg/dL — ABNORMAL LOW (ref 8.9–10.3)
Chloride: 110 mmol/L (ref 98–111)
Creatinine, Ser: 0.91 mg/dL (ref 0.61–1.24)
GFR, Estimated: >60 mL/min (ref >60)
Glucose, Bld: 188 mg/dL — ABNORMAL HIGH (ref 70–99)
Potassium: 3.7 mmol/L (ref 3.5–5.1)
Sodium: 138 mmol/L (ref 135–145)

## 2022-10-07 LAB — MAGNESIUM: Magnesium: 2 mg/dL (ref 1.7–2.4)

## 2022-10-07 LAB — GLUCOSE, CAPILLARY
Glucose-Capillary: 204 mg/dL — ABNORMAL HIGH (ref 70–99)
Glucose-Capillary: 209 mg/dL — ABNORMAL HIGH (ref 70–99)
Glucose-Capillary: 197 mg/dL — ABNORMAL HIGH (ref 70–99)
Glucose-Capillary: 181 mg/dL — ABNORMAL HIGH (ref 70–99)

## 2022-10-07 MED ORDER — ENSURE ENLIVE PO LIQD
237.0000 mL | Freq: Three times a day (TID) | ORAL | Status: DC
  Administered 2022-10-07 – 2022-10-24 (×17): 237 mL via ORAL

## 2022-10-07 MED ORDER — FUROSEMIDE 20 MG PO TABS
20.0000 mg | ORAL_TABLET | Freq: Every day | ORAL | Status: DC
  Administered 2022-10-07: 20 mg via ORAL
  Filled 2022-10-07: qty 1

## 2022-10-07 NOTE — Progress Notes (Signed)
Patient refusing to be turned or cleaned. Last night stated he will wait until morning, this morning he stated he wants to wait until day time.

## 2022-10-07 NOTE — Progress Notes (Signed)
IP PROGRESS NOTE  Subjective:   No complaint.  Objective: Vital signs in last 24 hours: Blood pressure (!) 90/58, pulse 72, temperature 98.4 F (36.9 C), temperature source Oral, resp. rate 20, height '6\' 4"'$  (1.93 m), weight 261 lb 11 oz (118.7 kg), SpO2 97 %.  Intake/Output from previous day: 12/17 0701 - 12/18 0700 In: 960 [P.O.:960] Out: 3650 [Urine:3650]  Physical Exam:   Abdomen: No hepatosplenomegaly  Vascular: Anasarca  Portacath/PICC-without erythema  Lab Results: Recent Labs    10/06/22 0230 10/07/22 0240  WBC 4.4 4.3  HGB 7.7* 7.7*  HCT 27.1* 26.5*  PLT 71* 85*    BMET Recent Labs    10/06/22 0230 10/07/22 0240  NA 137 138  K 3.8 3.7  CL 110 110  CO2 21* 23  GLUCOSE 226* 188*  BUN 30* 29*  CREATININE 0.97 0.91  CALCIUM 8.2* 8.2*    Medications: I have reviewed the patient's current medications.  Mr Laforge appears unchanged.  The hemoglobin is stable and the platelet count has improved over the past few days.  The PT/INR and fibrinogen are also improved.  I suspect the hematologic findings are related to infection, malnutrition, and polypharmacy.  Anemia is also related to bleeding and iron deficiency.  Assessment/Plan:  Microcytic anemia-iron deficiency and multifactorial, status post IV iron 10/04/2022, 10/05/2022, now on oral iron 2.  Thrombocytopenia, normal platelet count on hospital admission-improved 3.  History of ESBL E. coli and CRE Proteus, prostatitis, scrotal cellulitis-treated with Bactrim and 4-week course of ertapenem 4.  History of "prostate cancer "and BPH 5.  Hematuria on hospital admission-resolved, followed by urology 6.  Ischemic cardiomyopathy 7.  "Septic "and cardiogenic shock November 2023 8.   Diabetes 9.   Paroxysmal atrial fibrillation 10.  Coagulopathy-likely secondary to malnutrition and polypharmacy-improved following course of vitamin K     Recommendations: Continue iron Limit blood draws as possible 3.    Follow-up CBC later this week and as an outpatient 4.   Call hematology as needed   LOS: 36 days   Betsy Coder, MD   10/07/2022, 4:22 PM

## 2022-10-07 NOTE — Progress Notes (Signed)
[facility] Progress Note Patient: Peter Yang [id number]:[id number] DOB: January 28, 1956 DOA: 08/31/2022  DOS: the patient was seen and examined on 10/07/2022  Brief hospital course: 66 year old man who presented to the [facility] ED 11/11 with a chief complaint of hematuria. PMHx significant for HTN, HLD, HFrEF/ICM EF 40-45%, CAD/NSTEMI, TIA, PAF (on Eliquis), T2DM, BPH (hx prostate CA). Patient with questionable length of time of hematuria which may have been going on since 10/13 per patient.  Sent to the emergency department by SNF staff on 11/11. 11/11 Admit by TRH, BC>, UC>, PCCM consult.  During his course patient has been seen by urology.  ID was also consulted patient was on IV antibiotics for 4 weeks.  Palliative care was consulted for symptom management and goals of care.  Assessment and Plan: Septic shock as well as cardiogenic shock and hypovolemic shock. Treated with IV antibiotics, IV pressors as well as midodrine blood pressure currently stable. Shock physiology resolved. Sepsis physiology resolved as well. Continue midodrine for now.  Acute on chronic HFrEF. Ischemic cardiomyopathy. CAD SP PCI. Significant concern for cardiogenic shock. Cardiology was consulted. Not a candidate for home inotropes, revascularization or transplant or LVAD. Unable to tolerate GDMT due to hypotension. Currently on Eliquis and Plavix. Continues to remain volume overloaded.  Responding well to IV Lasix and albumin combination. Still has significant scrotal swelling which places the patient at high risk for recurrent scrotal cellulitis and therefore we will continue with diuresis as long as his blood pressure supports. Initiating oral Lasix.  Prostatitis. Scrotal cellulitis. History of ESBL E. coli and CRE Proteus. Urology was consulted. ID was consulted. Patient was started on ertapenem for 4 weeks. PICC line currently in place. Antibiotic course completed as of 12/10. Discussed with ID.  No  indication for inpatient follow-up for now as long as patient appears stable.  Hematuria. History of prostate cancer. H&H relatively stable. No further hematuria seen. Patient had a Foley catheter. Removed on 12/12. Patient able to void after that. Currently has a urostomy bag as the patient's scrotum is still swollen significantly.  Hyponatremia. Hypocalcemia. AKI. Resolved.  Monitor.  Type 2 diabetes mellitus, controlled with long-term insulin use. Continue sliding scale insulin as well as long-acting insulin. Hemoglobin A1c was 6.6 in October.  Deconditioning. PT recommends SNF. Social worker currently working.  Chronic stage II sacral ulcer. Present on admission. Continue wound care.  Left lower extremity phantom pain. Insomnia. On gabapentin, melatonin, trazodone, Ambien.  New onset thrombocytopenia. At baseline platelet counts are normal. Platelet count trends down.  Hematology consulted.  Recommend supportive measures for now. No active bleeding reported by the patient. Currently holding Eliquis.  Await platelet count more than 90,000 before resuming. With the platelet count drops below 40,000 would prefer to stop Plavix as well. Adam TS 13 marginally low.  Platelet count have stabilized.  Monitor.  Anemia, normocytic. Likely secondary to multiple etiologies.  No active bleeding seen so far.  Therefore no indication for endoscopic evaluation for now. Outpatient hematology consultation as patient had some schistocytes on the smear. Currently recommend supportive measures. Started on IV iron therapy as well as O27 and folic acid. Hemoglobin less than 8.    Obesity Body mass index is 31.85 kg/m.  Placing the pt at higher risk of poor outcomes.  Constipation. Initiate bowel regimen.   Subjective: No acute complaint.  No nausea no vomiting.  No fever no chills.  Physical Exam: General: Appear in mild distress; Cardiovascular: S1 and S2 Present, no  Murmur, Respiratory:  good respiratory effort, Bilateral Air entry present, CTA, no Crackles, no wheezes Abdomen: Bowel Sound present, Non tender, persistent scrotal swelling as well as anasarca Extremities: Bilateral pedal edema Neurology: alert and oriented to time, place, and person   Data Reviewed: I have Reviewed nursing notes, Vitals, and Lab results. Since last encounter, pertinent lab results CBC and BMP   . I have ordered test including BMP  .    Disposition: Status is: Inpatient Remains inpatient appropriate because: Continue Lasix for treatment for anasarca.  Place and maintain sequential compression device Start: 10/02/22 1444 SCDs Start: 08/31/22 1944 apixaban (ELIQUIS) tablet 5 mg   Family Communication: No one at bedside Level of care: Telemetry continue telemetry for hypotension. Vitals:   10/06/22 2030 10/07/22 0300 10/07/22 0613 10/07/22 1441  BP: 94/67  93/68 (!) 90/58  Pulse: 77  71 72  Resp: '20  16 20  '$ Temp: 98.5 F (36.9 C)  98.4 F (36.9 C)   TempSrc: Oral  Oral   SpO2: 99%  98% 97%  Weight:  118.7 kg    Height:         Author: Berle Mull, MD 10/07/2022 7:09 PM  Please look on [email or web address] to find out who is on call.

## 2022-10-08 DIAGNOSIS — R310 Gross hematuria: Secondary | ICD-10-CM | POA: Diagnosis not present

## 2022-10-08 LAB — GLUCOSE, CAPILLARY
Glucose-Capillary: 214 mg/dL — ABNORMAL HIGH (ref 70–99)
Glucose-Capillary: 160 mg/dL — ABNORMAL HIGH (ref 70–99)
Glucose-Capillary: 187 mg/dL — ABNORMAL HIGH (ref 70–99)
Glucose-Capillary: 210 mg/dL — ABNORMAL HIGH (ref 70–99)

## 2022-10-08 MED ORDER — FUROSEMIDE 20 MG PO TABS
20.0000 mg | ORAL_TABLET | Freq: Two times a day (BID) | ORAL | Status: DC
  Administered 2022-10-08 – 2022-10-14 (×13): 20 mg via ORAL
  Filled 2022-10-08 (×13): qty 1

## 2022-10-08 NOTE — Progress Notes (Signed)
[facility] Progress Note Patient: Peter Yang [id number]:[id number] DOB: 1956/05/15 DOA: 08/31/2022  DOS: the patient was seen and examined on 10/08/2022  Brief hospital course: 66 year old man who presented to the [facility] ED 11/11 with a chief complaint of hematuria. PMHx significant for HTN, HLD, HFrEF/ICM EF 40-45%, CAD/NSTEMI, TIA, PAF (on Eliquis), T2DM, BPH (hx prostate CA). Patient with questionable length of time of hematuria which may have been going on since 10/13 per patient.  Sent to the emergency department by SNF staff on 11/11. 11/11 Admit by TRH, BC>, UC>, PCCM consult.  During his course patient has been seen by urology.  ID was also consulted patient was on IV antibiotics for 4 weeks.  Palliative care was consulted for symptom management and goals of care. Developed thrombocytopenia, as well as anemia.  Currently volume overloaded, aggressively diuresed.  Assessment and Plan: Septic shock as well as cardiogenic shock and hypovolemic shock. Treated with IV antibiotics, IV pressors as well as midodrine Blood pressure currently stable on oral midodrine. Shock physiology resolved. Sepsis physiology resolved as well. Continue midodrine for now.  Acute on chronic HFrEF. Ischemic cardiomyopathy. CAD SP PCI. Significant concern for cardiogenic shock. Cardiology was consulted. Not a candidate for home inotropes, revascularization or transplant or LVAD. Unable to tolerate GDMT due to hypotension. Currently on Eliquis and Plavix. Patient was severely volume overloaded.  Treated with IV albumin and IV Lasix combination. Continues to have scrotal swelling due to his volume overload which increases his risk for scrotal cellulitis for which she was on 4 weeks of IV antibiotics. Responding well to oral Lasix with stable blood pressure.  Prostatitis. Scrotal cellulitis. History of ESBL E. coli and CRE Proteus. Urology was consulted. ID was consulted. Patient was started on ertapenem  for 4 weeks. PICC line currently in place. Antibiotic course completed as of 12/10. Discussed with ID.  No indication for inpatient follow-up for now as long as patient appears stable. Ideally patient should have The Foley catheter although this was removed as he was worried that the Foley catheter is responsible for the scrotal swelling.  Multiple times discussed with the patient that the Foley catheter is not responsible for his scrotal swelling.  Most viable option for outpatient treatment is depends with frequent change-every 4 hour.  As I am not sure if your ostomy bag that he has right now is available at the rehab.  Hematuria. History of prostate cancer. H&H relatively stable. No further hematuria seen. Patient had a Foley catheter. Removed on 12/12 per his request. Patient able to void after that. Currently has a urostomy bag as the patient's scrotum is still swollen significantly.  Hyponatremia. Hypocalcemia. AKI. Resolved.  Monitor.  Type 2 diabetes mellitus, controlled with long-term insulin use. Continue sliding scale insulin as well as long-acting insulin. Hemoglobin A1c was 6.6 in October.  Deconditioning. PT recommends SNF. Social worker currently working. Patient has decided on [facility] SN[facility]  Chronic stage II sacral ulcer. Present on admission. Continue wound care.  Left lower extremity phantom pain. Insomnia. On gabapentin, melatonin, trazodone, Ambien.  New onset thrombocytopenia. At baseline platelet counts are normal. Platelet count trends down.  Hematology consulted.  Recommend supportive measures for now. No active bleeding reported by the patient. Eliquis was briefly hold.  Hematology was consulted.  Recommended conservative measures and minimizing blood draws. AdmTS 13 marginally low. Platelet count have stabilized.  No bleeding on resumption of Eliquis.  Anemia, normocytic. Likely secondary to multiple etiologies.  No active bleeding seen so far.  Therefore no indication for endoscopic evaluation for now. Outpatient hematology consultation as patient had some schistocytes on the smear. Currently recommend supportive measures.  I do not think the patient actually had a GI bleed and therefore not consulting GI. Started on IV iron therapy as well as L46 and folic acid. Hemoglobin less than 8.    Obesity Body mass index is 32.44 kg/m.  Placing the pt at higher risk of poor outcomes.  Constipation. Initiate bowel regimen.   Subjective: No nausea no vomiting.  No fever no chills.  Pain well-controlled.  Still probably has at least 6 L of fluid as volume load.  Physical Exam: General: Appear in mild distress; Cardiovascular: S1 and S2 Present, no Murmur, Respiratory: good respiratory effort, Bilateral Air entry present, CTA, no Crackles, no wheezes Abdomen: Bowel Sound present, Non tender  Extremities: bilateral Pedal edema Neurology: alert and oriented to time, place, and person  Data Reviewed: I have Reviewed nursing notes, Vitals, and Lab results. Since last encounter, pertinent lab results CBC and BMP   . I have ordered test including CBC and BMP  .     Disposition: Status is: Inpatient Remains inpatient appropriate because: Continue Lasix for treatment for anasarca. Now medically stable.  Awaiting initiation of placement.  Patient has finalized [facility] as his choice.  Place and maintain sequential compression device Start: 10/02/22 1444 SCDs Start: 08/31/22 1944 apixaban (ELIQUIS) tablet 5 mg   Family Communication: No one at bedside Level of care: Telemetry continue telemetry for hypotension. Vitals:   10/07/22 2127 10/08/22 0500 10/08/22 0629 10/08/22 1418  BP:   110/69 103/73  Pulse:   77 82  Resp: '18  17 18  '$ Temp:   97.8 F (36.6 C) 97.9 F (36.6 C)  TempSrc:   Oral Oral  SpO2:   98% 98%  Weight:  120.9 kg    Height:         Author: Berle Mull, MD 10/08/2022 8:51 PM  Please look on [email or web address] to  find out who is on call.

## 2022-10-09 DIAGNOSIS — R310 Gross hematuria: Secondary | ICD-10-CM | POA: Diagnosis not present

## 2022-10-09 DIAGNOSIS — R570 Cardiogenic shock: Secondary | ICD-10-CM | POA: Diagnosis not present

## 2022-10-09 DIAGNOSIS — N179 Acute kidney failure, unspecified: Secondary | ICD-10-CM | POA: Diagnosis not present

## 2022-10-09 DIAGNOSIS — I480 Paroxysmal atrial fibrillation: Secondary | ICD-10-CM | POA: Diagnosis not present

## 2022-10-09 LAB — RENAL FUNCTION PANEL
Albumin: 2.6 g/dL — ABNORMAL LOW (ref 3.5–5.0)
Anion gap: 7 (ref 5–15)
BUN: 30 mg/dL — ABNORMAL HIGH (ref 8–23)
CO2: 25 mmol/L (ref 22–32)
Calcium: 8.2 mg/dL — ABNORMAL LOW (ref 8.9–10.3)
Chloride: 108 mmol/L (ref 98–111)
Creatinine, Ser: 1.04 mg/dL (ref 0.61–1.24)
GFR, Estimated: >60 mL/min (ref >60)
Glucose, Bld: 212 mg/dL — ABNORMAL HIGH (ref 70–99)
Phosphorus: 3.5 mg/dL (ref 2.5–4.6)
Potassium: 3.7 mmol/L (ref 3.5–5.1)
Sodium: 140 mmol/L (ref 135–145)

## 2022-10-09 LAB — GLUCOSE, CAPILLARY
Glucose-Capillary: 163 mg/dL — ABNORMAL HIGH (ref 70–99)
Glucose-Capillary: 139 mg/dL — ABNORMAL HIGH (ref 70–99)
Glucose-Capillary: 191 mg/dL — ABNORMAL HIGH (ref 70–99)
Glucose-Capillary: 197 mg/dL — ABNORMAL HIGH (ref 70–99)

## 2022-10-09 LAB — CBC
HCT: 29.7 % — ABNORMAL LOW (ref 39.0–52.0)
Hemoglobin: 8.6 g/dL — ABNORMAL LOW (ref 13.0–17.0)
MCH: 23.7 pg — ABNORMAL LOW (ref 26.0–34.0)
MCHC: 29 g/dL — ABNORMAL LOW (ref 30.0–36.0)
MCV: 81.8 fL (ref 80.0–100.0)
Platelets: 101 10*3/uL — ABNORMAL LOW (ref 150–400)
RBC: 3.63 MIL/uL — ABNORMAL LOW (ref 4.22–5.81)
RDW: 21.8 % — ABNORMAL HIGH (ref 11.5–15.5)
WBC: 4.9 10*3/uL (ref 4.0–10.5)
nRBC: 0 % (ref 0.0–0.2)

## 2022-10-09 LAB — MAGNESIUM: Magnesium: 1.9 mg/dL (ref 1.7–2.4)

## 2022-10-09 NOTE — TOC Progression Note (Signed)
Transition of Care [facility]) - Progression Note    Patient Details  Name: Peter Yang MRN: [id number] Date of Birth: 06-Jun-1956  Transition of Care [facility]) CM/SW Contact  Kathern Lobosco, Juliann Pulse, RN Phone Number: 10/09/2022, 1:52 PM  Clinical Narrative: Patient chose [address] for ST SNF rep aware. MD notified of PT order needed to initiate auth since last PT visit 12/5. Await PT note prior auth.      Planned Disposition: Skilled Nursing Facility Barriers to Discharge: Continued Medical Work up  Expected Discharge Plan and Services   Discharge Planning Services: CM Consult   Living arrangements for the past 2 months: [place] Determinants of Health (SDOH) Interventions [place]: No Food Insecurity (09/07/2022)  Housing: Low Risk  (09/07/2022)  Transportation Needs: Unmet Transportation Needs (09/07/2022)  Utilities: At Risk (09/07/2022)  Tobacco Use: Low Risk  (09/02/2022)    Readmission Risk Interventions    09/02/2022   10:36 AM  Readmission Risk Prevention Plan  Transportation Screening Complete  Medication Review ([place]) Complete  PCP or Specialist appointment within 3-5 days of discharge Complete  HRI or [place] Complete  SW Recovery Care/Counseling Consult Complete  [place] Complete

## 2022-10-09 NOTE — Progress Notes (Signed)
[facility]  PROGRESS NOTE  Peter Yang SAY:[id number] DOB: [birth year] DOA: 08/31/2022 PCP: Clovia Cuff, MD   Brief HPI:   66 year old man who presented to the [facility] ED 11/11 with a chief complaint of hematuria. PMHx significant for HTN, HLD, HFrEF/ICM EF 40-45%, CAD/NSTEMI, TIA, PAF (on Eliquis), T2DM, BPH (hx prostate CA). Patient with questionable length of time of hematuria which may have been going on since 10/13 per patient.  Sent to the emergency department by SNF staff on 11/11. 11/11 Admit by TRH, BC>, UC>, PCCM consult.  During his course patient has been seen by urology.  ID was also consulted patient was on IV antibiotics for 4 weeks.  Palliative care was consulted for symptom management and goals of care.    Subjective   Patient seen and examined, denies any complaints.   Assessment/Plan:    Septic shock -Sepsis physiology has resolved -Patient had recent hospitalization for prostatitis with ESBL E. coli and Proteus mirabilis, treated with 6 weeks of Bactrim -Presented with hematuria and CT finding concerning for cysto prostatitis -Antibiotics were changed from Bactrim to meropenem and amoxicillin -ID was consulted, recommended to continue with 4 weeks of meropenem; antibiotics completed on 09/29/2022 Ideally patient should have The Foley catheter although this was removed as he was worried that the Foley catheter is responsible for the scrotal swelling. -Currently patient is ostomy bag with suction  Acute on chronic HFrEF -Ischemic cardiomyopathy, CAD s/p PCI - concern for cardiogenic shock, cardiology was consulted -Not a candidate for home inotropes, revascularization or transplant or LVAD -Unable to tolerate GDMT due to hypotension -Patient is currently on Eliquis and Plavix -Treated with IV albumin and IV Lasix for volume overload  Scrotal cellulitis -Currently on IV meropenem  Hematuria -Has history of prostate cancer -Patient had Foley catheter  which was removed on 12/12 as per patient request -Patient was able to void after that, currently has urostomy bag; patient's scrotum is still significantly swollen  Diabetes mellitus type 2 -Continue sliding scale insulin with NovoLog -CBG well-controlled  Stage II sacral ulcer -Continue wound care  Left lower extremity phantom pain -Continue gabapentin  New onset thrombocytopenia -Platelet count is went down, hematology was consulted -Likely due to viral suppression from sepsis, consumption, DIC and polypharmacy -Recommended conservative management -No active bleeding reported per patient -Eliquis was briefly held -Adamts13 activity marginally low -Eliquis was resumed; platelet count is up 101,000  Normocytic anemia -Started on IV iron therapy as well as A35 and folic acid -Hematology was consulted' felt due to phlebotomy, chronic disease, sepsis syndrome and iron deficiency -Hemoglobin is stable at 8.6 -Follow CBC in a.m.    Medications     apixaban  5 mg Oral BID   atorvastatin  80 mg Oral Daily   B-complex with vitamin C  1 tablet Oral Daily   Chlorhexidine Gluconate Cloth  6 each Topical Daily   clopidogrel  75 mg Oral Daily   vitamin B-12  1,000 mcg Oral Daily   feeding supplement  237 mL Oral TID BM   ferrous sulfate  325 mg Oral Q breakfast   finasteride  5 mg Oral Daily   furosemide  20 mg Oral BID   gabapentin  300 mg Oral q1800   gabapentin  300 mg Oral Daily   hydrocortisone  20 mg Oral Daily   And   hydrocortisone  10 mg Oral Daily   insulin aspart  0-5 Units Subcutaneous QHS   insulin aspart  0-9 Units Subcutaneous TID  WC   lidocaine  1 patch Transdermal Daily   melatonin  5 mg Oral q1800   midodrine  10 mg Oral TID with meals   nystatin   Topical BID   pantoprazole  40 mg Oral Daily   senna-docusate  1 tablet Oral BID   sodium chloride flush  10-40 mL Intracatheter Q12H   tamsulosin  0.4 mg Oral Daily   traZODone  50 mg Oral q1800   zolpidem   5 mg Oral q1800     Data Reviewed:   CBG:  Recent Labs  Lab 10/08/22 1640 10/08/22 2101 10/09/22 0734 10/09/22 1135 10/09/22 1604  GLUCAP 187* 210* 163* 139* 191*    SpO2: 99 % FiO2 (%): 21 %    Vitals:   10/08/22 2106 10/09/22 0500 10/09/22 0516 10/09/22 1139  BP: 118/67  105/66 101/76  Pulse: 71  64 72  Resp: '16  15 17  '$ Temp: 97.6 F (36.4 C)  98.2 F (36.8 C) 98 F (36.7 C)  TempSrc: Oral  Oral Oral  SpO2: 98%  100% 99%  Weight:  117.2 kg    Height:          Data Reviewed:  Basic Metabolic Panel: Recent Labs  Lab 10/04/22 1700 10/05/22 0355 10/06/22 0230 10/07/22 0240 10/09/22 0311  NA 135 136 137 138 140  K 3.6 3.3* 3.8 3.7 3.7  CL 107 106 110 110 108  CO2 20* 22 21* 23 25  GLUCOSE 187* 153* 226* 188* 212*  BUN 32* 30* 30* 29* 30*  CREATININE 0.96 0.96 0.97 0.91 1.04  CALCIUM 8.2* 8.1* 8.2* 8.2* 8.2*  MG 1.8 1.9 2.0 2.0 1.9  PHOS  --   --   --   --  3.5    CBC: Recent Labs  Lab 10/03/22 0243 10/03/22 1825 10/04/22 0411 10/05/22 0355 10/06/22 0230 10/07/22 0240 10/09/22 0311  WBC 4.7   < > 5.6 6.0 4.4 4.3 4.9  NEUTROABS 3.1  --  3.6 4.1 2.5 2.3  --   HGB 6.9*   < > 8.2* 8.1* 7.7* 7.7* 8.6*  HCT 23.6*   < > 27.5* 27.0* 27.1* 26.5* 29.7*  MCV 77.4*   < > 77.9* 76.7* 78.8* 79.8* 81.8  PLT 61*   < > 65* 71* 71* 85* 101*   < > = values in this interval not displayed.    LFT Recent Labs  Lab 10/03/22 0243 10/09/22 0311  AST 10*  --   ALT 12  --   ALKPHOS 69  --   BILITOT 0.6  --   PROT 4.8*  --   ALBUMIN 2.7* 2.6*     Antibiotics: Anti-infectives (From admission, onward)    Start     Dose/Rate Route Frequency Ordered Stop   09/05/22 1600  ertapenem (INVANZ) 1,000 mg in sodium chloride 0.9 % 100 mL IVPB        1 g 200 mL/hr over 30 Minutes Intravenous Every 24 hours 09/05/22 1357 09/30/22 0601   09/03/22 0800  meropenem (MERREM) 1 g in sodium chloride 0.9 % 100 mL IVPB  Status:  Discontinued        1 g 200 mL/hr over 30  Minutes Intravenous Every 8 hours 09/03/22 0201 09/05/22 1357   09/01/22 1100  meropenem (MERREM) 1 g in sodium chloride 0.9 % 100 mL IVPB  Status:  Discontinued        1 g 200 mL/hr over 30 Minutes Intravenous Every 12 hours 09/01/22 0416 09/03/22  0201   09/01/22 1100  fluconazole (DIFLUCAN) IVPB 200 mg  Status:  Discontinued        200 mg 100 mL/hr over 60 Minutes Intravenous Every 24 hours 09/01/22 1020 09/04/22 1309   09/01/22 0600  meropenem (MERREM) 1 g in sodium chloride 0.9 % 100 mL IVPB  Status:  Discontinued        1 g 200 mL/hr over 30 Minutes Intravenous Every 8 hours 08/31/22 2136 09/01/22 0416   08/31/22 2200  sulfamethoxazole-trimethoprim (BACTRIM DS) 800-160 MG per tablet 1 tablet  Status:  Discontinued        1 tablet Oral 2 times daily 08/31/22 2011 08/31/22 2129   08/31/22 2200  amoxicillin (AMOXIL) capsule 500 mg  Status:  Discontinued        500 mg Oral Every 8 hours 08/31/22 2131 09/03/22 1417   08/31/22 2145  meropenem (MERREM) 1 g in sodium chloride 0.9 % 100 mL IVPB        1 g 200 mL/hr over 30 Minutes Intravenous  Once 08/31/22 2136 09/01/22 0820   08/31/22 1815  cefTRIAXone (ROCEPHIN) 1 g in sodium chloride 0.9 % 100 mL IVPB        1 g 200 mL/hr over 30 Minutes Intravenous  Once 08/31/22 1814 08/31/22 1911        DVT prophylaxis: Eliquis  Code Status: DNR  Family Communication:    CONSULTS    Objective    Physical Examination:   Appears in no acute distress Heart S1-S2, regular Lungs clear to auscultation bilaterally Extremities b/l  edema noted   Status is: Inpatient:      Pressure Injury 08/03/22 Buttocks Mid Stage 2 -  Partial thickness loss of dermis presenting as a shallow open injury with a red, pink wound bed without slough. area of red blanchable and non blanchable, and areas with skin sloughed off (Active)  08/03/22 1513  Location: Buttocks  Location Orientation: Mid  Staging: Stage 2 -  Partial thickness loss of dermis  presenting as a shallow open injury with a red, pink wound bed without slough.  Wound Description (Comments): area of red blanchable and non blanchable, and areas with skin sloughed off  Present on Admission: Yes        [place]   [facility] If 7PM-7AM, please contact night-coverage at [email or web address], Office  [phone]   10/09/2022, 5:26 PM  LOS: 38 days

## 2022-10-09 NOTE — Plan of Care (Signed)
  Problem: Metabolic: Goal: Ability to maintain appropriate glucose levels will improve Outcome: Progressing   Problem: Nutritional: Goal: Maintenance of adequate nutrition will improve Outcome: Progressing Goal: Progress toward achieving an optimal weight will improve Outcome: Progressing

## 2022-10-10 DIAGNOSIS — R570 Cardiogenic shock: Secondary | ICD-10-CM | POA: Diagnosis not present

## 2022-10-10 DIAGNOSIS — N179 Acute kidney failure, unspecified: Secondary | ICD-10-CM | POA: Diagnosis not present

## 2022-10-10 DIAGNOSIS — I480 Paroxysmal atrial fibrillation: Secondary | ICD-10-CM | POA: Diagnosis not present

## 2022-10-10 DIAGNOSIS — R310 Gross hematuria: Secondary | ICD-10-CM | POA: Diagnosis not present

## 2022-10-10 LAB — GLUCOSE, CAPILLARY
Glucose-Capillary: 167 mg/dL — ABNORMAL HIGH (ref 70–99)
Glucose-Capillary: 192 mg/dL — ABNORMAL HIGH (ref 70–99)
Glucose-Capillary: 167 mg/dL — ABNORMAL HIGH (ref 70–99)
Glucose-Capillary: 220 mg/dL — ABNORMAL HIGH (ref 70–99)

## 2022-10-10 NOTE — Progress Notes (Signed)
Physical Therapy Treatment ( re-assessment) Patient Details Name: Peter Yang MRN: [id number] DOB: [birth year] Today's Date: 10/10/2022   History of Present Illness Pt is a 66 year old man admitted from SNF with gross hematuria, UTI and sepsis on 11/11. PMH: CAD, NSTEMI, AAA, TIA, PAF, DM, diabetic neuropathy, L BKA, HTN, HLD, CHF with EF of 40-45%, recent admissions with sepsis, UTI, hypoglycemia and recent COVID. Pt has also had several recetn hosptializations since August this year.    PT Comments    Pt had been seen by PT during this hospitalization, but pt was discontinued due to refusing several times. Now pt is ready,cooperative and wanting to get better. Today pt worked on rolling and sat edge of bed to begin activity tolerance. Pt did not want to attempt to stand or pivot today due to needing to get sore bottom and dressing assessed by nursing. So agreed to lie back down.   Unsure of accuracy of PLOF ) assuming he may be stating prior to August when his hospitalizations began)  and some things questionable based on info from prior admission. Looked like back in August pt was able to "hop" with a RW as he stated that is what he did at home without his prothesis , and with his prosthesis he was even able to use a cane. He stated he had HHPT working and walking outside with him down his driveway. Since then he has been in and out of hospital and working on tranfers with RW to [facility].   We will continue to work in strengthening, bed mobility and tranfers. Pt still pitting edema in BUEs and BLEs.   May try stedy equipment for sit to stands if able.    Recommendations for follow up therapy are one component of a multi-disciplinary discharge planning process, led by the attending physician.  Recommendations may be updated based on patient status, additional functional criteria and insurance authorization.  Follow Up Recommendations  Skilled nursing-short term rehab (<3 hours/day) Can patient  physically be transported by private vehicle: No   Assistance Recommended at Discharge Frequent or constant Supervision/Assistance  Patient can return home with the following Two people to help with walking and/or transfers;Help with stairs or ramp for entrance;A little help with bathing/dressing/bathroom;Assistance with cooking/housework;Assist for transportation   Equipment Recommendations  None recommended by PT    Recommendations for Other Services       Precautions / Restrictions Precautions Precautions: Fall Precaution Comments: L BKA with prosthesis in room, wear shrinker and monitor BP Restrictions Weight Bearing Restrictions: No     Mobility  Bed Mobility Overal bed mobility: Needs Assistance Bed Mobility: Rolling, Supine to Sit, Sit to Supine Rolling: Mod assist (cues for sequencing and next steps to perform task)   Supine to sit: Mod assist, +2 for physical assistance Sit to supine: Mod assist, +2 for physical assistance   General bed mobility comments: cues and increased time , and pain in "butt" as he stated was preventing him from scooting. Used pads under him to assist with scooting to edge of bed, in proper sitting position . Sat edge of bed for about 6-7 minutes , at this time did not want to attempt a stand he wanted to lie back down to having nursing address his bottom .    Transfers                        Ambulation/Gait  Stairs             Wheelchair Mobility    Modified Rankin (Stroke Patients Only)       Balance   Sitting-balance support: Bilateral upper extremity supported, Feet supported Sitting balance-Leahy Scale: Fair                                      Cognition Arousal/Alertness: Awake/alert Behavior During Therapy: Flat affect (slighted agitated at times, but still cooperative and pleasant)   Area of Impairment: Orientation                       Following  Commands: Follows one step commands with increased time, Follows multi-step commands consistently Safety/Judgement: Decreased awareness of safety, Decreased awareness of deficits              Exercises      General Comments        Pertinent Vitals/Pain Pain Assessment Pain Assessment: Faces Faces Pain Scale: Hurts little more Pain Location: my butt Pain Descriptors / Indicators: Discomfort, Guarding    Home Living                          Prior Function            PT Goals (current goals can now be found in the care plan section) Acute Rehab PT Goals Patient Stated Goal: to get better and return home PT Goal Formulation: With patient Time For Goal Achievement: 10/24/22 Potential to Achieve Goals: Fair Progress towards PT goals: PT to reassess next treatment (re assessment and will re set goals)    Frequency    Min 2X/week      PT Plan Current plan remains appropriate    Co-evaluation              AM-PAC PT "6 Clicks" Mobility   Outcome Measure  Help needed turning from your back to your side while in a flat bed without using bedrails?: A Little Help needed moving from lying on your back to sitting on the side of a flat bed without using bedrails?: A Lot Help needed moving to and from a bed to a chair (including a wheelchair)?: A Lot Help needed standing up from a chair using your arms (e.g., wheelchair or bedside chair)?: Total Help needed to walk in hospital room?: Total Help needed climbing 3-5 steps with a railing? : Total 6 Click Score: 10    End of Session   Activity Tolerance: Patient tolerated treatment well Patient left: in bed;with bed alarm set;with nursing/sitter in room Nurse Communication: Mobility status PT Visit Diagnosis: Other abnormalities of gait and mobility (R26.89);Muscle weakness (generalized) (M62.81)     Time: 1355-1420 PT Time Calculation (min) (ACUTE ONLY): 25 min  Charges:  $Therapeutic Activity: 23-37  mins                     Gatha Mayer, PT, MPT Acute Rehabilitation Services Office: [phone] If a weekend: [facility] w/e pager [phone] 10/10/2022    Clide Dales 10/10/2022, 4:19 PM

## 2022-10-10 NOTE — Progress Notes (Signed)
[facility]  PROGRESS NOTE  Peter Yang ZDG:[id number] DOB: 07/09/1956 DOA: 08/31/2022 PCP: Clovia Cuff, MD   Brief HPI:   66 year old man who presented to the [facility] ED 11/11 with a chief complaint of hematuria. PMHx significant for HTN, HLD, HFrEF/ICM EF 40-45%, CAD/NSTEMI, TIA, PAF (on Eliquis), T2DM, BPH (hx prostate CA). Patient with questionable length of time of hematuria which may have been going on since 10/13 per patient.  Sent to the emergency department by SNF staff on 11/11. 11/11 Admit by TRH, BC>, UC>, PCCM consult.  During his course patient has been seen by urology.  ID was also consulted patient was on IV antibiotics for 4 weeks.  Palliative care was consulted for symptom management and goals of care.    Subjective   Patient seen and examined, denies any complaints.   Assessment/Plan:    Septic shock -Sepsis physiology has resolved -Patient had recent hospitalization for prostatitis with ESBL E. coli and Proteus mirabilis, treated with 6 weeks of Bactrim -Presented with hematuria and CT finding concerning for cysto prostatitis -Antibiotics were changed from Bactrim to meropenem and amoxicillin -ID was consulted, recommended to continue with 4 weeks of meropenem; antibiotics completed on 09/29/2022 Ideally patient should have The Foley catheter although this was removed as he was worried that the Foley catheter is responsible for the scrotal swelling. -Currently patient is ostomy bag with suction  Acute on chronic HFrEF -Ischemic cardiomyopathy, CAD s/p PCI - concern for cardiogenic shock, cardiology was consulted -Not a candidate for home inotropes, revascularization or transplant or LVAD -Unable to tolerate GDMT due to hypotension -Patient is currently on Eliquis and Plavix -Treated with IV albumin and IV Lasix for volume overload  Scrotal cellulitis -Currently on IV meropenem  Hematuria -Has history of prostate cancer -Patient had Foley catheter  which was removed on 12/12 as per patient request -Patient was able to void after that, currently has urostomy bag; patient's scrotum is still significantly swollen  Diabetes mellitus type 2 -Continue sliding scale insulin with NovoLog -CBG well-controlled  Stage II sacral ulcer -Continue wound care  Left lower extremity phantom pain -Continue gabapentin  New onset thrombocytopenia -Platelet count is went down, hematology was consulted -Likely due to viral suppression from sepsis, consumption, DIC and polypharmacy -Recommended conservative management -No active bleeding reported per patient -Eliquis was briefly held -Adamts13 activity marginally low -Eliquis was resumed; platelet count is up 101,000  Normocytic anemia -Started on IV iron therapy as well as R51 and folic acid -Hematology was consulted' felt due to phlebotomy, chronic disease, sepsis syndrome and iron deficiency -Hemoglobin is stable at 8.6 -Follow CBC in a.m.    Medications     apixaban  5 mg Oral BID   atorvastatin  80 mg Oral Daily   B-complex with vitamin C  1 tablet Oral Daily   Chlorhexidine Gluconate Cloth  6 each Topical Daily   clopidogrel  75 mg Oral Daily   vitamin B-12  1,000 mcg Oral Daily   feeding supplement  237 mL Oral TID BM   ferrous sulfate  325 mg Oral Q breakfast   finasteride  5 mg Oral Daily   furosemide  20 mg Oral BID   gabapentin  300 mg Oral q1800   gabapentin  300 mg Oral Daily   hydrocortisone  20 mg Oral Daily   And   hydrocortisone  10 mg Oral Daily   insulin aspart  0-5 Units Subcutaneous QHS   insulin aspart  0-9 Units Subcutaneous TID  WC   lidocaine  1 patch Transdermal Daily   melatonin  5 mg Oral q1800   midodrine  10 mg Oral TID with meals   nystatin   Topical BID   pantoprazole  40 mg Oral Daily   senna-docusate  1 tablet Oral BID   sodium chloride flush  10-40 mL Intracatheter Q12H   tamsulosin  0.4 mg Oral Daily   traZODone  50 mg Oral q1800   zolpidem   5 mg Oral q1800     Data Reviewed:   CBG:  Recent Labs  Lab 10/09/22 1135 10/09/22 1604 10/09/22 2143 10/10/22 0757 10/10/22 1128  GLUCAP 139* 191* 197* 167* 192*    SpO2: 100 % FiO2 (%): 21 %    Vitals:   10/10/22 0400 10/10/22 0500 10/10/22 0627 10/10/22 1214  BP:   96/63 (!) 107/96  Pulse:   69 77  Resp: 13 (!) '9 18 17  '$ Temp:   97.9 F (36.6 C) 98 F (36.7 C)  TempSrc:   Oral Oral  SpO2:   99% 100%  Weight:      Height:          Data Reviewed:  Basic Metabolic Panel: Recent Labs  Lab 10/04/22 1700 10/05/22 0355 10/06/22 0230 10/07/22 0240 10/09/22 0311  NA 135 136 137 138 140  K 3.6 3.3* 3.8 3.7 3.7  CL 107 106 110 110 108  CO2 20* 22 21* 23 25  GLUCOSE 187* 153* 226* 188* 212*  BUN 32* 30* 30* 29* 30*  CREATININE 0.96 0.96 0.97 0.91 1.04  CALCIUM 8.2* 8.1* 8.2* 8.2* 8.2*  MG 1.8 1.9 2.0 2.0 1.9  PHOS  --   --   --   --  3.5    CBC: Recent Labs  Lab 10/04/22 0411 10/05/22 0355 10/06/22 0230 10/07/22 0240 10/09/22 0311  WBC 5.6 6.0 4.4 4.3 4.9  NEUTROABS 3.6 4.1 2.5 2.3  --   HGB 8.2* 8.1* 7.7* 7.7* 8.6*  HCT 27.5* 27.0* 27.1* 26.5* 29.7*  MCV 77.9* 76.7* 78.8* 79.8* 81.8  PLT 65* 71* 71* 85* 101*    LFT Recent Labs  Lab 10/09/22 0311  ALBUMIN 2.6*     Antibiotics: Anti-infectives (From admission, onward)    Start     Dose/Rate Route Frequency Ordered Stop   09/05/22 1600  ertapenem (INVANZ) 1,000 mg in sodium chloride 0.9 % 100 mL IVPB        1 g 200 mL/hr over 30 Minutes Intravenous Every 24 hours 09/05/22 1357 09/30/22 0601   09/03/22 0800  meropenem (MERREM) 1 g in sodium chloride 0.9 % 100 mL IVPB  Status:  Discontinued        1 g 200 mL/hr over 30 Minutes Intravenous Every 8 hours 09/03/22 0201 09/05/22 1357   09/01/22 1100  meropenem (MERREM) 1 g in sodium chloride 0.9 % 100 mL IVPB  Status:  Discontinued        1 g 200 mL/hr over 30 Minutes Intravenous Every 12 hours 09/01/22 0416 09/03/22 0201   09/01/22 1100   fluconazole (DIFLUCAN) IVPB 200 mg  Status:  Discontinued        200 mg 100 mL/hr over 60 Minutes Intravenous Every 24 hours 09/01/22 1020 09/04/22 1309   09/01/22 0600  meropenem (MERREM) 1 g in sodium chloride 0.9 % 100 mL IVPB  Status:  Discontinued        1 g 200 mL/hr over 30 Minutes Intravenous Every 8 hours 08/31/22 2136 09/01/22  0962   08/31/22 2200  sulfamethoxazole-trimethoprim (BACTRIM DS) 800-160 MG per tablet 1 tablet  Status:  Discontinued        1 tablet Oral 2 times daily 08/31/22 2011 08/31/22 2129   08/31/22 2200  amoxicillin (AMOXIL) capsule 500 mg  Status:  Discontinued        500 mg Oral Every 8 hours 08/31/22 2131 09/03/22 1417   08/31/22 2145  meropenem (MERREM) 1 g in sodium chloride 0.9 % 100 mL IVPB        1 g 200 mL/hr over 30 Minutes Intravenous  Once 08/31/22 2136 09/01/22 0820   08/31/22 1815  cefTRIAXone (ROCEPHIN) 1 g in sodium chloride 0.9 % 100 mL IVPB        1 g 200 mL/hr over 30 Minutes Intravenous  Once 08/31/22 1814 08/31/22 1911        DVT prophylaxis: Eliquis  Code Status: DNR  Family Communication:    CONSULTS    Objective    Physical Examination:  Appears in no acute distress S1-S2, regular, no murmur auscultated Clear to auscultation bilaterally Abdomen is soft, nontender, no organomegaly No edema in the lower extremities   Status is: Inpatient:      Pressure Injury 08/03/22 Buttocks Mid Stage 2 -  Partial thickness loss of dermis presenting as a shallow open injury with a red, pink wound bed without slough. area of red blanchable and non blanchable, and areas with skin sloughed off (Active)  08/03/22 1513  Location: Buttocks  Location Orientation: Mid  Staging: Stage 2 -  Partial thickness loss of dermis presenting as a shallow open injury with a red, pink wound bed without slough.  Wound Description (Comments): area of red blanchable and non blanchable, and areas with skin sloughed off  Present on Admission: Yes         [place]   [facility] If 7PM-7AM, please contact night-coverage at [email or web address], Office  [phone]   10/10/2022, 1:11 PM  LOS: 39 days

## 2022-10-10 NOTE — TOC Progression Note (Signed)
Transition of Care [facility]) - Progression Note    Patient Details  Name: Peter Yang MRN: [id number] Date of Birth: 04/05/1956  Transition of Care [facility]) CM/SW Contact  Karon Heckendorn, Juliann Pulse, RN Phone Number: 10/10/2022, 11:51 AM  Clinical Narrative:  Awaiting PT visit & documentation prior starting auth for [facility].MD notified.     Planned Disposition: Skilled Nursing Facility Barriers to Discharge: Continued Medical Work up  Expected Discharge Plan and Services   Discharge Planning Services: CM Consult   Living arrangements for the past 2 months: [place] Determinants of Health (SDOH) Interventions [place]: No Food Insecurity (09/07/2022)  Housing: Low Risk  (09/07/2022)  Transportation Needs: Unmet Transportation Needs (09/07/2022)  Utilities: At Risk (09/07/2022)  Tobacco Use: Low Risk  (09/02/2022)    Readmission Risk Interventions    09/02/2022   10:36 AM  Readmission Risk Prevention Plan  Transportation Screening Complete  Medication Review ([place]) Complete  PCP or Specialist appointment within 3-5 days of discharge Complete  HRI or [place] Complete  SW Recovery Care/Counseling Consult Complete  [place] Complete

## 2022-10-11 DIAGNOSIS — I2510 Atherosclerotic heart disease of native coronary artery without angina pectoris: Secondary | ICD-10-CM | POA: Diagnosis not present

## 2022-10-11 DIAGNOSIS — N492 Inflammatory disorders of scrotum: Secondary | ICD-10-CM | POA: Diagnosis not present

## 2022-10-11 DIAGNOSIS — N179 Acute kidney failure, unspecified: Secondary | ICD-10-CM | POA: Diagnosis not present

## 2022-10-11 DIAGNOSIS — R310 Gross hematuria: Secondary | ICD-10-CM | POA: Diagnosis not present

## 2022-10-11 LAB — GLUCOSE, CAPILLARY
Glucose-Capillary: 176 mg/dL — ABNORMAL HIGH (ref 70–99)
Glucose-Capillary: 161 mg/dL — ABNORMAL HIGH (ref 70–99)
Glucose-Capillary: 248 mg/dL — ABNORMAL HIGH (ref 70–99)
Glucose-Capillary: 326 mg/dL — ABNORMAL HIGH (ref 70–99)

## 2022-10-11 LAB — CBC
HCT: 31.7 % — ABNORMAL LOW (ref 39.0–52.0)
Hemoglobin: 9.3 g/dL — ABNORMAL LOW (ref 13.0–17.0)
MCH: 24 pg — ABNORMAL LOW (ref 26.0–34.0)
MCHC: 29.3 g/dL — ABNORMAL LOW (ref 30.0–36.0)
MCV: 81.9 fL (ref 80.0–100.0)
Platelets: 138 10*3/uL — ABNORMAL LOW (ref 150–400)
RBC: 3.87 MIL/uL — ABNORMAL LOW (ref 4.22–5.81)
RDW: 24.1 % — ABNORMAL HIGH (ref 11.5–15.5)
WBC: 5.4 10*3/uL (ref 4.0–10.5)
nRBC: 0 % (ref 0.0–0.2)

## 2022-10-11 LAB — BASIC METABOLIC PANEL
Anion gap: 8 (ref 5–15)
BUN: 28 mg/dL — ABNORMAL HIGH (ref 8–23)
CO2: 23 mmol/L (ref 22–32)
Calcium: 8.1 mg/dL — ABNORMAL LOW (ref 8.9–10.3)
Chloride: 106 mmol/L (ref 98–111)
Creatinine, Ser: 1.05 mg/dL (ref 0.61–1.24)
GFR, Estimated: >60 mL/min (ref >60)
Glucose, Bld: 320 mg/dL — ABNORMAL HIGH (ref 70–99)
Potassium: 3.2 mmol/L — ABNORMAL LOW (ref 3.5–5.1)
Sodium: 137 mmol/L (ref 135–145)

## 2022-10-11 NOTE — Progress Notes (Signed)
[facility]  PROGRESS NOTE  Peter Yang ZOX:[id number] DOB: 1955-10-29 DOA: 08/31/2022 PCP: Clovia Cuff, MD   Brief HPI:   66 year old man who presented to the [facility] ED 11/11 with a chief complaint of hematuria. PMHx significant for HTN, HLD, HFrEF/ICM EF 40-45%, CAD/NSTEMI, TIA, PAF (on Eliquis), T2DM, BPH (hx prostate CA). Patient with questionable length of time of hematuria which may have been going on since 10/13 per patient.  Sent to the emergency department by SNF staff on 11/11. 11/11 Admit by TRH, BC>, UC>, PCCM consult.  During his course patient has been seen by urology.  ID was also consulted patient was on IV antibiotics for 4 weeks.  Palliative care was consulted for symptom management and goals of care.    Subjective   No new complaints, awaiting bed at skilled nursing facility   Assessment/Plan:    Septic shock -Sepsis physiology has resolved -Patient had recent hospitalization for prostatitis with ESBL E. coli and Proteus mirabilis, treated with 6 weeks of Bactrim -Presented with hematuria and CT finding concerning for cysto prostatitis -Antibiotics were changed from Bactrim to meropenem and amoxicillin -ID was consulted, recommended to continue with 4 weeks of meropenem; antibiotics completed on 09/29/2022 Ideally patient should have The Foley catheter although this was removed as he was worried that the Foley catheter is responsible for the scrotal swelling. -Currently patient is ostomy bag with suction  Acute on chronic HFrEF -Ischemic cardiomyopathy, CAD s/p PCI - concern for cardiogenic shock, cardiology was consulted -Not a candidate for home inotropes, revascularization or transplant or LVAD -Unable to tolerate GDMT due to hypotension -Patient is currently on Eliquis and Plavix -Treated with IV albumin and IV Lasix for volume overload  Scrotal cellulitis -Currently on IV meropenem  Hematuria -Has history of prostate cancer -Patient had Foley  catheter which was removed on 12/12 as per patient request -Patient was able to void after that, currently has urostomy bag; patient's scrotum is still significantly swollen -Urology recommends outpatient cystoscopy for hematuria workup  Diabetes mellitus type 2 -Continue sliding scale insulin with NovoLog -CBG well-controlled  Stage II sacral ulcer -Continue wound care  Left lower extremity phantom pain -Continue gabapentin  New onset thrombocytopenia -Platelet count is went down, hematology was consulted -Likely due to viral suppression from sepsis, consumption, DIC and polypharmacy -Recommended conservative management -No active bleeding reported per patient -Eliquis was briefly held -Adamts13 activity marginally low -Eliquis was resumed; platelet count is up 101,000  Normocytic anemia -Started on IV iron therapy as well as W11 and folic acid -Hematology was consulted' felt due to phlebotomy, chronic disease, sepsis syndrome and iron deficiency -Hemoglobin is stable at 8.6 -Follow CBC in a.m.    Medications     apixaban  5 mg Oral BID   atorvastatin  80 mg Oral Daily   B-complex with vitamin C  1 tablet Oral Daily   Chlorhexidine Gluconate Cloth  6 each Topical Daily   clopidogrel  75 mg Oral Daily   vitamin B-12  1,000 mcg Oral Daily   feeding supplement  237 mL Oral TID BM   ferrous sulfate  325 mg Oral Q breakfast   finasteride  5 mg Oral Daily   furosemide  20 mg Oral BID   gabapentin  300 mg Oral q1800   gabapentin  300 mg Oral Daily   hydrocortisone  20 mg Oral Daily   And   hydrocortisone  10 mg Oral Daily   insulin aspart  0-5 Units Subcutaneous QHS  insulin aspart  0-9 Units Subcutaneous TID WC   lidocaine  1 patch Transdermal Daily   melatonin  5 mg Oral q1800   midodrine  10 mg Oral TID with meals   nystatin   Topical BID   pantoprazole  40 mg Oral Daily   senna-docusate  1 tablet Oral BID   sodium chloride flush  10-40 mL Intracatheter Q12H    tamsulosin  0.4 mg Oral Daily   traZODone  50 mg Oral q1800   zolpidem  5 mg Oral q1800     Data Reviewed:   CBG:  Recent Labs  Lab 10/10/22 0757 10/10/22 1128 10/10/22 1630 10/10/22 2126 10/11/22 0744  GLUCAP 167* 192* 167* 220* 176*    SpO2: 96 % FiO2 (%): 21 %    Vitals:   10/10/22 0627 10/10/22 1214 10/10/22 2129 10/11/22 0508  BP: 96/63 (!) 107/96 111/67 114/69  Pulse: 69 77 71 69  Resp: '18 17 16 15  '$ Temp: 97.9 F (36.6 C) 98 F (36.7 C) 98.3 F (36.8 C) 98.4 F (36.9 C)  TempSrc: Oral Oral Oral Oral  SpO2: 99% 100% 96% 96%  Weight:    118.1 kg  Height:          Data Reviewed:  Basic Metabolic Panel: Recent Labs  Lab 10/04/22 1700 10/05/22 0355 10/06/22 0230 10/07/22 0240 10/09/22 0311  NA 135 136 137 138 140  K 3.6 3.3* 3.8 3.7 3.7  CL 107 106 110 110 108  CO2 20* 22 21* 23 25  GLUCOSE 187* 153* 226* 188* 212*  BUN 32* 30* 30* 29* 30*  CREATININE 0.96 0.96 0.97 0.91 1.04  CALCIUM 8.2* 8.1* 8.2* 8.2* 8.2*  MG 1.8 1.9 2.0 2.0 1.9  PHOS  --   --   --   --  3.5    CBC: Recent Labs  Lab 10/05/22 0355 10/06/22 0230 10/07/22 0240 10/09/22 0311  WBC 6.0 4.4 4.3 4.9  NEUTROABS 4.1 2.5 2.3  --   HGB 8.1* 7.7* 7.7* 8.6*  HCT 27.0* 27.1* 26.5* 29.7*  MCV 76.7* 78.8* 79.8* 81.8  PLT 71* 71* 85* 101*    LFT Recent Labs  Lab 10/09/22 0311  ALBUMIN 2.6*     Antibiotics: Anti-infectives (From admission, onward)    Start     Dose/Rate Route Frequency Ordered Stop   09/05/22 1600  ertapenem (INVANZ) 1,000 mg in sodium chloride 0.9 % 100 mL IVPB        1 g 200 mL/hr over 30 Minutes Intravenous Every 24 hours 09/05/22 1357 09/30/22 0601   09/03/22 0800  meropenem (MERREM) 1 g in sodium chloride 0.9 % 100 mL IVPB  Status:  Discontinued        1 g 200 mL/hr over 30 Minutes Intravenous Every 8 hours 09/03/22 0201 09/05/22 1357   09/01/22 1100  meropenem (MERREM) 1 g in sodium chloride 0.9 % 100 mL IVPB  Status:  Discontinued        1  g 200 mL/hr over 30 Minutes Intravenous Every 12 hours 09/01/22 0416 09/03/22 0201   09/01/22 1100  fluconazole (DIFLUCAN) IVPB 200 mg  Status:  Discontinued        200 mg 100 mL/hr over 60 Minutes Intravenous Every 24 hours 09/01/22 1020 09/04/22 1309   09/01/22 0600  meropenem (MERREM) 1 g in sodium chloride 0.9 % 100 mL IVPB  Status:  Discontinued        1 g 200 mL/hr over 30 Minutes Intravenous Every  8 hours 08/31/22 2136 09/01/22 0416   08/31/22 2200  sulfamethoxazole-trimethoprim (BACTRIM DS) 800-160 MG per tablet 1 tablet  Status:  Discontinued        1 tablet Oral 2 times daily 08/31/22 2011 08/31/22 2129   08/31/22 2200  amoxicillin (AMOXIL) capsule 500 mg  Status:  Discontinued        500 mg Oral Every 8 hours 08/31/22 2131 09/03/22 1417   08/31/22 2145  meropenem (MERREM) 1 g in sodium chloride 0.9 % 100 mL IVPB        1 g 200 mL/hr over 30 Minutes Intravenous  Once 08/31/22 2136 09/01/22 0820   08/31/22 1815  cefTRIAXone (ROCEPHIN) 1 g in sodium chloride 0.9 % 100 mL IVPB        1 g 200 mL/hr over 30 Minutes Intravenous  Once 08/31/22 1814 08/31/22 1911        DVT prophylaxis: Eliquis  Code Status: DNR  Family Communication:    CONSULTS    Objective    Physical Examination:  General-appears in no acute distress Heart-S1-S2, regular, no murmur auscultated Lungs-clear to auscultation bilaterally, no wheezing or crackles auscultated Abdomen-soft, nontender, no organomegaly Extremities-trace edema in lower extremities Neuro-alert, oriented x3, no focal deficit noted   Status is: Inpatient:      Pressure Injury 08/03/22 Buttocks Mid Stage 2 -  Partial thickness loss of dermis presenting as a shallow open injury with a red, pink wound bed without slough. area of red blanchable and non blanchable, and areas with skin sloughed off (Active)  08/03/22 1513  Location: Buttocks  Location Orientation: Mid  Staging: Stage 2 -  Partial thickness loss of dermis  presenting as a shallow open injury with a red, pink wound bed without slough.  Wound Description (Comments): area of red blanchable and non blanchable, and areas with skin sloughed off  Present on Admission: Yes        [place]   [facility] If 7PM-7AM, please contact night-coverage at [email or web address], Office  [phone]   10/11/2022, 10:19 AM  LOS: 40 days

## 2022-10-12 DIAGNOSIS — N492 Inflammatory disorders of scrotum: Secondary | ICD-10-CM | POA: Diagnosis not present

## 2022-10-12 DIAGNOSIS — R310 Gross hematuria: Secondary | ICD-10-CM | POA: Diagnosis not present

## 2022-10-12 DIAGNOSIS — N179 Acute kidney failure, unspecified: Secondary | ICD-10-CM | POA: Diagnosis not present

## 2022-10-12 DIAGNOSIS — I2510 Atherosclerotic heart disease of native coronary artery without angina pectoris: Secondary | ICD-10-CM | POA: Diagnosis not present

## 2022-10-12 LAB — GLUCOSE, CAPILLARY
Glucose-Capillary: 184 mg/dL — ABNORMAL HIGH (ref 70–99)
Glucose-Capillary: 195 mg/dL — ABNORMAL HIGH (ref 70–99)
Glucose-Capillary: 193 mg/dL — ABNORMAL HIGH (ref 70–99)
Glucose-Capillary: 242 mg/dL — ABNORMAL HIGH (ref 70–99)

## 2022-10-12 MED ORDER — POTASSIUM CHLORIDE CRYS ER 20 MEQ PO TBCR
40.0000 meq | EXTENDED_RELEASE_TABLET | ORAL | Status: AC
  Administered 2022-10-12 (×2): 40 meq via ORAL
  Filled 2022-10-12 (×2): qty 2

## 2022-10-12 NOTE — Progress Notes (Signed)
[facility]  PROGRESS NOTE  Peter Yang WFU:[id number] DOB: 04/21/1956 DOA: 08/31/2022 PCP: Clovia Cuff, MD   Brief HPI:   66 year old man who presented to the [facility] ED 11/11 with a chief complaint of hematuria. PMHx significant for HTN, HLD, HFrEF/ICM EF 40-45%, CAD/NSTEMI, TIA, PAF (on Eliquis), T2DM, BPH (hx prostate CA). Patient with questionable length of time of hematuria which may have been going on since 10/13 per patient.  Sent to the emergency department by SNF staff on 11/11. 11/11 Admit by TRH, BC>, UC>, PCCM consult.  During his course patient has been seen by urology.  ID was also consulted patient was on IV antibiotics for 4 weeks.  Palliative care was consulted for symptom management and goals of care.    Subjective   No new complaints, awaiting bed at skilled nursing facility.   Assessment/Plan:    Septic shock -Sepsis physiology has resolved -Patient had recent hospitalization for prostatitis with ESBL E. coli and Proteus mirabilis, treated with 6 weeks of Bactrim -Presented with hematuria and CT finding concerning for cysto prostatitis -Antibiotics were changed from Bactrim to meropenem and amoxicillin -ID was consulted, recommended to continue with 4 weeks of meropenem; antibiotics completed on 09/29/2022 Ideally patient should have The Foley catheter although this was removed as he was worried that the Foley catheter is responsible for the scrotal swelling. -Currently patient is ostomy bag with suction  Acute on chronic HFrEF -Ischemic cardiomyopathy, CAD s/p PCI - concern for cardiogenic shock, cardiology was consulted -Not a candidate for home inotropes, revascularization or transplant or LVAD -Unable to tolerate GDMT due to hypotension -Patient is currently on Eliquis and Plavix -Treated with IV albumin and IV Lasix for volume overload  Scrotal cellulitis -Currently on IV meropenem  Hematuria -Has history of prostate cancer -Patient had  Foley catheter which was removed on 12/12 as per patient request -Patient was able to void after that, currently has urostomy bag; patient's scrotum is still significantly swollen -Urology recommends outpatient cystoscopy for hematuria workup  Diabetes mellitus type 2 -Continue sliding scale insulin with NovoLog -CBG well-controlled  Stage II sacral ulcer -Continue wound care  Hypokalemia - potassium is 3.2 today - replace potassium and follow bmp in am  Left lower extremity phantom pain -Continue gabapentin  New onset thrombocytopenia -Platelet count is went down, hematology was consulted -Likely due to viral suppression from sepsis, consumption, DIC and polypharmacy -Recommended conservative management -No active bleeding reported per patient -Eliquis was briefly held -Adamts13 activity marginally low -Eliquis was resumed; platelet count is up 101,000  Normocytic anemia -Started on IV iron therapy as well as K02 and folic acid -Hematology was consulted' felt due to phlebotomy, chronic disease, sepsis syndrome and iron deficiency -Hemoglobin is stable at 9.3 -Follow CBC in a.m.    Medications     apixaban  5 mg Oral BID   atorvastatin  80 mg Oral Daily   B-complex with vitamin C  1 tablet Oral Daily   Chlorhexidine Gluconate Cloth  6 each Topical Daily   clopidogrel  75 mg Oral Daily   vitamin B-12  1,000 mcg Oral Daily   feeding supplement  237 mL Oral TID BM   ferrous sulfate  325 mg Oral Q breakfast   finasteride  5 mg Oral Daily   furosemide  20 mg Oral BID   gabapentin  300 mg Oral q1800   gabapentin  300 mg Oral Daily   hydrocortisone  20 mg Oral Daily   And  hydrocortisone  10 mg Oral Daily   insulin aspart  0-5 Units Subcutaneous QHS   insulin aspart  0-9 Units Subcutaneous TID WC   lidocaine  1 patch Transdermal Daily   melatonin  5 mg Oral q1800   midodrine  10 mg Oral TID with meals   nystatin   Topical BID   pantoprazole  40 mg Oral Daily    senna-docusate  1 tablet Oral BID   sodium chloride flush  10-40 mL Intracatheter Q12H   tamsulosin  0.4 mg Oral Daily   traZODone  50 mg Oral q1800   zolpidem  5 mg Oral q1800     Data Reviewed:   CBG:  Recent Labs  Lab 10/11/22 0744 10/11/22 1208 10/11/22 1647 10/11/22 2112 10/12/22 0730  GLUCAP 176* 161* 248* 326* 184*    SpO2: 98 % FiO2 (%): 21 %    Vitals:   10/10/22 2129 10/11/22 0508 10/11/22 2116 10/12/22 0452  BP: 111/67 114/69 120/78 127/84  Pulse: 71 69 85 78  Resp: '16 15 14 13  '$ Temp: 98.3 F (36.8 C) 98.4 F (36.9 C) 98.2 F (36.8 C) 98.1 F (36.7 C)  TempSrc: Oral Oral Oral Oral  SpO2: 96% 96% 98% 98%  Weight:  118.1 kg    Height:          Data Reviewed:  Basic Metabolic Panel: Recent Labs  Lab 10/06/22 0230 10/07/22 0240 10/09/22 0311 10/11/22 1830  NA 137 138 140 137  K 3.8 3.7 3.7 3.2*  CL 110 110 108 106  CO2 21* '23 25 23  '$ GLUCOSE 226* 188* 212* 320*  BUN 30* 29* 30* 28*  CREATININE 0.97 0.91 1.04 1.05  CALCIUM 8.2* 8.2* 8.2* 8.1*  MG 2.0 2.0 1.9  --   PHOS  --   --  3.5  --     CBC: Recent Labs  Lab 10/06/22 0230 10/07/22 0240 10/09/22 0311 10/11/22 1830  WBC 4.4 4.3 4.9 5.4  NEUTROABS 2.5 2.3  --   --   HGB 7.7* 7.7* 8.6* 9.3*  HCT 27.1* 26.5* 29.7* 31.7*  MCV 78.8* 79.8* 81.8 81.9  PLT 71* 85* 101* 138*    LFT Recent Labs  Lab 10/09/22 0311  ALBUMIN 2.6*     Antibiotics: Anti-infectives (From admission, onward)    Start     Dose/Rate Route Frequency Ordered Stop   09/05/22 1600  ertapenem (INVANZ) 1,000 mg in sodium chloride 0.9 % 100 mL IVPB        1 g 200 mL/hr over 30 Minutes Intravenous Every 24 hours 09/05/22 1357 09/30/22 0601   09/03/22 0800  meropenem (MERREM) 1 g in sodium chloride 0.9 % 100 mL IVPB  Status:  Discontinued        1 g 200 mL/hr over 30 Minutes Intravenous Every 8 hours 09/03/22 0201 09/05/22 1357   09/01/22 1100  meropenem (MERREM) 1 g in sodium chloride 0.9 % 100 mL IVPB   Status:  Discontinued        1 g 200 mL/hr over 30 Minutes Intravenous Every 12 hours 09/01/22 0416 09/03/22 0201   09/01/22 1100  fluconazole (DIFLUCAN) IVPB 200 mg  Status:  Discontinued        200 mg 100 mL/hr over 60 Minutes Intravenous Every 24 hours 09/01/22 1020 09/04/22 1309   09/01/22 0600  meropenem (MERREM) 1 g in sodium chloride 0.9 % 100 mL IVPB  Status:  Discontinued        1 g 200  mL/hr over 30 Minutes Intravenous Every 8 hours 08/31/22 2136 09/01/22 0416   08/31/22 2200  sulfamethoxazole-trimethoprim (BACTRIM DS) 800-160 MG per tablet 1 tablet  Status:  Discontinued        1 tablet Oral 2 times daily 08/31/22 2011 08/31/22 2129   08/31/22 2200  amoxicillin (AMOXIL) capsule 500 mg  Status:  Discontinued        500 mg Oral Every 8 hours 08/31/22 2131 09/03/22 1417   08/31/22 2145  meropenem (MERREM) 1 g in sodium chloride 0.9 % 100 mL IVPB        1 g 200 mL/hr over 30 Minutes Intravenous  Once 08/31/22 2136 09/01/22 0820   08/31/22 1815  cefTRIAXone (ROCEPHIN) 1 g in sodium chloride 0.9 % 100 mL IVPB        1 g 200 mL/hr over 30 Minutes Intravenous  Once 08/31/22 1814 08/31/22 1911        DVT prophylaxis: Eliquis  Code Status: DNR  Family Communication:    CONSULTS    Objective    Physical Examination:  Appears in no acute distress S1-S2, regular Clear to auscultation bilaterally Abdomen is soft, nontender   Status is: Inpatient:      Pressure Injury 08/03/22 Buttocks Mid Stage 2 -  Partial thickness loss of dermis presenting as a shallow open injury with a red, pink wound bed without slough. area of red blanchable and non blanchable, and areas with skin sloughed off (Active)  08/03/22 1513  Location: Buttocks  Location Orientation: Mid  Staging: Stage 2 -  Partial thickness loss of dermis presenting as a shallow open injury with a red, pink wound bed without slough.  Wound Description (Comments): area of red blanchable and non blanchable, and areas  with skin sloughed off  Present on Admission: Yes        [place]   [facility] If 7PM-7AM, please contact night-coverage at [email or web address], Office  [phone]   10/12/2022, 10:16 AM  LOS: 41 days

## 2022-10-13 DIAGNOSIS — N492 Inflammatory disorders of scrotum: Secondary | ICD-10-CM | POA: Diagnosis not present

## 2022-10-13 DIAGNOSIS — I2510 Atherosclerotic heart disease of native coronary artery without angina pectoris: Secondary | ICD-10-CM | POA: Diagnosis not present

## 2022-10-13 DIAGNOSIS — R310 Gross hematuria: Secondary | ICD-10-CM | POA: Diagnosis not present

## 2022-10-13 DIAGNOSIS — N179 Acute kidney failure, unspecified: Secondary | ICD-10-CM | POA: Diagnosis not present

## 2022-10-13 LAB — BASIC METABOLIC PANEL
Anion gap: 7 (ref 5–15)
BUN: 27 mg/dL — ABNORMAL HIGH (ref 8–23)
CO2: 24 mmol/L (ref 22–32)
Calcium: 8.4 mg/dL — ABNORMAL LOW (ref 8.9–10.3)
Chloride: 105 mmol/L (ref 98–111)
Creatinine, Ser: 0.92 mg/dL (ref 0.61–1.24)
GFR, Estimated: >60 mL/min (ref >60)
Glucose, Bld: 227 mg/dL — ABNORMAL HIGH (ref 70–99)
Potassium: 3.7 mmol/L (ref 3.5–5.1)
Sodium: 136 mmol/L (ref 135–145)

## 2022-10-13 LAB — GLUCOSE, CAPILLARY
Glucose-Capillary: 173 mg/dL — ABNORMAL HIGH (ref 70–99)
Glucose-Capillary: 196 mg/dL — ABNORMAL HIGH (ref 70–99)
Glucose-Capillary: 225 mg/dL — ABNORMAL HIGH (ref 70–99)
Glucose-Capillary: 211 mg/dL — ABNORMAL HIGH (ref 70–99)

## 2022-10-13 MED ORDER — IPRATROPIUM-ALBUTEROL 0.5-2.5 (3) MG/3ML IN SOLN
3.0000 mL | Freq: Once | RESPIRATORY_TRACT | Status: AC
  Administered 2022-10-13: 3 mL via RESPIRATORY_TRACT
  Filled 2022-10-13: qty 3

## 2022-10-13 NOTE — Plan of Care (Signed)
Patient lying in bed, reposition self for comfort.  Denies pain, sitting up  able to express his needs verbally, no  s/s of discomfort evident

## 2022-10-13 NOTE — Progress Notes (Addendum)
[facility]  PROGRESS NOTE  Peter Yang DUK:[id number] DOB: 09-Feb-1956 DOA: 08/31/2022 PCP: Clovia Cuff, MD   Brief HPI:   66 year old man who presented to the [facility] ED 11/11 with a chief complaint of hematuria. PMHx significant for HTN, HLD, HFrEF/ICM EF 40-45%, CAD/NSTEMI, TIA, PAF (on Eliquis), T2DM, BPH (hx prostate CA). Patient with questionable length of time of hematuria which may have been going on since 10/13 per patient.  Sent to the emergency department by SNF staff on 11/11. 11/11 Admit by TRH, BC>, UC>, PCCM consult.  During his course patient has been seen by urology.  ID was also consulted patient was on IV antibiotics for 4 weeks.  Palliative care was consulted for symptom management and goals of care.    Subjective   Patient seen and examined, complains of mild shortness of breath today.   Assessment/Plan:    Septic shock -Sepsis physiology has resolved -Patient had recent hospitalization for prostatitis with ESBL E. coli and Proteus mirabilis, treated with 6 weeks of Bactrim -Presented with hematuria and CT finding concerning for cysto prostatitis -Antibiotics were changed from Bactrim to meropenem and amoxicillin -ID was consulted, recommended to continue with 4 weeks of meropenem; antibiotics completed on 09/29/2022 Ideally patient should have The Foley catheter although this was removed as he was worried that the Foley catheter is responsible for the scrotal swelling. -Currently patient is ostomy bag with suction  Paroxysmal atrial fibrillation -Heart rate is controlled -Continue Eliquis for anticoagulation  Acute on chronic HFrEF -Ischemic cardiomyopathy, CAD s/p PCI - concern for cardiogenic shock, cardiology was consulted -Not a candidate for home inotropes, revascularization or transplant or LVAD -Unable to tolerate GDMT due to hypotension -Patient is currently on Eliquis and Plavix -Treated with IV albumin and IV Lasix for volume  overload -Currently on Lasix 20 mg p.o. twice daily -Net -19 L -Will give 1 dose of DuoNeb nebulizers x 1  Scrotal cellulitis -Antibiotics course completed on 12/10  Hematuria -Has history of prostate cancer -Patient had Foley catheter which was removed on 12/12 as per patient request -Patient was able to void after that, currently has urostomy bag; patient's scrotum is still significantly swollen -Urology recommends outpatient cystoscopy for hematuria workup  Diabetes mellitus type 2 -Continue sliding scale insulin with NovoLog -CBG well-controlled  Stage II sacral ulcer -Continue wound care  Hypokalemia - potassium is 3.2 today - replace potassium and follow bmp in am  Left lower extremity phantom pain -Continue gabapentin  New onset thrombocytopenia -Platelet count is went down, hematology was consulted -Likely due to viral suppression from sepsis, consumption, DIC and polypharmacy -Recommended conservative management -No active bleeding reported per patient -Eliquis was briefly held -Adamts13 activity marginally low -Eliquis was resumed; platelet count is up 101,000  Normocytic anemia -Started on IV iron therapy as well as B76 and folic acid -Hematology was consulted' felt due to phlebotomy, chronic disease, sepsis syndrome and iron deficiency -Hemoglobin is stable at 9.3 -Follow CBC in a.m.    Medications     apixaban  5 mg Oral BID   atorvastatin  80 mg Oral Daily   B-complex with vitamin C  1 tablet Oral Daily   Chlorhexidine Gluconate Cloth  6 each Topical Daily   clopidogrel  75 mg Oral Daily   vitamin B-12  1,000 mcg Oral Daily   feeding supplement  237 mL Oral TID BM   ferrous sulfate  325 mg Oral Q breakfast   finasteride  5 mg Oral Daily  furosemide  20 mg Oral BID   gabapentin  300 mg Oral q1800   gabapentin  300 mg Oral Daily   hydrocortisone  20 mg Oral Daily   And   hydrocortisone  10 mg Oral Daily   insulin aspart  0-5 Units  Subcutaneous QHS   insulin aspart  0-9 Units Subcutaneous TID WC   lidocaine  1 patch Transdermal Daily   melatonin  5 mg Oral q1800   midodrine  10 mg Oral TID with meals   nystatin   Topical BID   pantoprazole  40 mg Oral Daily   senna-docusate  1 tablet Oral BID   sodium chloride flush  10-40 mL Intracatheter Q12H   tamsulosin  0.4 mg Oral Daily   traZODone  50 mg Oral q1800   zolpidem  5 mg Oral q1800     Data Reviewed:   CBG:  Recent Labs  Lab 10/12/22 1145 10/12/22 1704 10/12/22 2117 10/13/22 0726 10/13/22 1129  GLUCAP 195* 193* 242* 173* 196*    SpO2: 97 % FiO2 (%): 21 %    Vitals:   10/12/22 2017 10/13/22 0426 10/13/22 1313 10/13/22 1314  BP: 105/70 120/77 99/70   Pulse: 74 75 79   Resp: '18 20 16   '$ Temp: 98 F (36.7 C) 98.3 F (36.8 C) 98.2 F (36.8 C)   TempSrc: Oral Oral Oral   SpO2: 97% 98% 96% 97%  Weight:      Height:          Data Reviewed:  Basic Metabolic Panel: Recent Labs  Lab 10/07/22 0240 10/09/22 0311 10/11/22 1830 10/13/22 0204  NA 138 140 137 136  K 3.7 3.7 3.2* 3.7  CL 110 108 106 105  CO2 '23 25 23 24  '$ GLUCOSE 188* 212* 320* 227*  BUN 29* 30* 28* 27*  CREATININE 0.91 1.04 1.05 0.92  CALCIUM 8.2* 8.2* 8.1* 8.4*  MG 2.0 1.9  --   --   PHOS  --  3.5  --   --     CBC: Recent Labs  Lab 10/07/22 0240 10/09/22 0311 10/11/22 1830  WBC 4.3 4.9 5.4  NEUTROABS 2.3  --   --   HGB 7.7* 8.6* 9.3*  HCT 26.5* 29.7* 31.7*  MCV 79.8* 81.8 81.9  PLT 85* 101* 138*    LFT Recent Labs  Lab 10/09/22 0311  ALBUMIN 2.6*     Antibiotics: Anti-infectives (From admission, onward)    Start     Dose/Rate Route Frequency Ordered Stop   09/05/22 1600  ertapenem (INVANZ) 1,000 mg in sodium chloride 0.9 % 100 mL IVPB        1 g 200 mL/hr over 30 Minutes Intravenous Every 24 hours 09/05/22 1357 09/30/22 0601   09/03/22 0800  meropenem (MERREM) 1 g in sodium chloride 0.9 % 100 mL IVPB  Status:  Discontinued        1 g 200 mL/hr  over 30 Minutes Intravenous Every 8 hours 09/03/22 0201 09/05/22 1357   09/01/22 1100  meropenem (MERREM) 1 g in sodium chloride 0.9 % 100 mL IVPB  Status:  Discontinued        1 g 200 mL/hr over 30 Minutes Intravenous Every 12 hours 09/01/22 0416 09/03/22 0201   09/01/22 1100  fluconazole (DIFLUCAN) IVPB 200 mg  Status:  Discontinued        200 mg 100 mL/hr over 60 Minutes Intravenous Every 24 hours 09/01/22 1020 09/04/22 1309   09/01/22 0600  meropenem (  MERREM) 1 g in sodium chloride 0.9 % 100 mL IVPB  Status:  Discontinued        1 g 200 mL/hr over 30 Minutes Intravenous Every 8 hours 08/31/22 2136 09/01/22 0416   08/31/22 2200  sulfamethoxazole-trimethoprim (BACTRIM DS) 800-160 MG per tablet 1 tablet  Status:  Discontinued        1 tablet Oral 2 times daily 08/31/22 2011 08/31/22 2129   08/31/22 2200  amoxicillin (AMOXIL) capsule 500 mg  Status:  Discontinued        500 mg Oral Every 8 hours 08/31/22 2131 09/03/22 1417   08/31/22 2145  meropenem (MERREM) 1 g in sodium chloride 0.9 % 100 mL IVPB        1 g 200 mL/hr over 30 Minutes Intravenous  Once 08/31/22 2136 09/01/22 0820   08/31/22 1815  cefTRIAXone (ROCEPHIN) 1 g in sodium chloride 0.9 % 100 mL IVPB        1 g 200 mL/hr over 30 Minutes Intravenous  Once 08/31/22 1814 08/31/22 1911        DVT prophylaxis: Eliquis  Code Status: DNR  Family Communication:    CONSULTS    Objective    Physical Examination:  Appears in no acute distress Lungs clear to auscultation bilaterally Heart S1-S2, regular, no murmur auscultated Extremities no edema   Status is: Inpatient:      Pressure Injury 08/03/22 Buttocks Mid Stage 2 -  Partial thickness loss of dermis presenting as a shallow open injury with a red, pink wound bed without slough. area of red blanchable and non blanchable, and areas with skin sloughed off (Active)  08/03/22 1513  Location: Buttocks  Location Orientation: Mid  Staging: Stage 2 -  Partial thickness  loss of dermis presenting as a shallow open injury with a red, pink wound bed without slough.  Wound Description (Comments): area of red blanchable and non blanchable, and areas with skin sloughed off  Present on Admission: Yes        [place]   [facility] If 7PM-7AM, please contact night-coverage at [email or web address], Office  [phone]   10/13/2022, 2:21 PM  LOS: 42 days

## 2022-10-14 DIAGNOSIS — R310 Gross hematuria: Secondary | ICD-10-CM | POA: Diagnosis not present

## 2022-10-14 DIAGNOSIS — N179 Acute kidney failure, unspecified: Secondary | ICD-10-CM | POA: Diagnosis not present

## 2022-10-14 DIAGNOSIS — N492 Inflammatory disorders of scrotum: Secondary | ICD-10-CM | POA: Diagnosis not present

## 2022-10-14 DIAGNOSIS — I2510 Atherosclerotic heart disease of native coronary artery without angina pectoris: Secondary | ICD-10-CM | POA: Diagnosis not present

## 2022-10-14 LAB — GLUCOSE, CAPILLARY
Glucose-Capillary: 168 mg/dL — ABNORMAL HIGH (ref 70–99)
Glucose-Capillary: 184 mg/dL — ABNORMAL HIGH (ref 70–99)
Glucose-Capillary: 241 mg/dL — ABNORMAL HIGH (ref 70–99)
Glucose-Capillary: 233 mg/dL — ABNORMAL HIGH (ref 70–99)

## 2022-10-14 MED ORDER — FUROSEMIDE 10 MG/ML IJ SOLN
20.0000 mg | Freq: Two times a day (BID) | INTRAMUSCULAR | Status: DC
  Administered 2022-10-14 – 2022-10-22 (×16): 20 mg via INTRAVENOUS
  Filled 2022-10-14 (×16): qty 2

## 2022-10-14 MED ORDER — FUROSEMIDE 10 MG/ML IJ SOLN
20.0000 mg | Freq: Once | INTRAMUSCULAR | Status: AC
  Administered 2022-10-14: 20 mg via INTRAVENOUS
  Filled 2022-10-14: qty 2

## 2022-10-14 NOTE — Plan of Care (Signed)
Sitting up in bed, bathed and stool cleaned up.patient able to assist in turning but unable to repostion self completely.

## 2022-10-14 NOTE — Progress Notes (Signed)
[facility]  PROGRESS NOTE  Peter Yang FGH:[id number] DOB: [birth year] DOA: 08/31/2022 PCP: Clovia Cuff, MD   Brief HPI:   66 year old man who presented to the [facility] ED 11/11 with a chief complaint of hematuria. PMHx significant for HTN, HLD, HFrEF/ICM EF 40-45%, CAD/NSTEMI, TIA, PAF (on Eliquis), T2DM, BPH (hx prostate CA). Patient with questionable length of time of hematuria which may have been going on since 10/13 per patient.  Sent to the emergency department by SNF staff on 11/11. 11/11 Admit by TRH, BC>, UC>, PCCM consult.  During his course patient has been seen by urology.  ID was also consulted patient was on IV antibiotics for 4 weeks.  Palliative care was consulted for symptom management and goals of care.    Subjective   Patient seen and examined, complains of mild shortness of breath today.  He still diuresing very well with p.o. Lasix.  This morning 1 dose of Lasix 20 mg IV was given and patient diuresed almost a liter after that.   Assessment/Plan:    Septic shock -Sepsis physiology has resolved -Patient had recent hospitalization for prostatitis with ESBL E. coli and Proteus mirabilis, treated with 6 weeks of Bactrim -Presented with hematuria and CT finding concerning for cysto prostatitis -Antibiotics were changed from Bactrim to meropenem and amoxicillin -ID was consulted, recommended to continue with 4 weeks of meropenem; antibiotics completed on 09/29/2022 Ideally patient should have The Foley catheter although this was removed as he was worried that the Foley catheter is responsible for the scrotal swelling. -Currently patient is ostomy bag with suction  Paroxysmal atrial fibrillation -Heart rate is controlled -Continue Eliquis for anticoagulation  Acute on chronic HFrEF -Ischemic cardiomyopathy, CAD s/p PCI - concern for cardiogenic shock, cardiology was consulted -Not a candidate for home inotropes, revascularization or transplant or LVAD -Unable  to tolerate GDMT due to hypotension -Patient is currently on Eliquis and Plavix -Treated with IV albumin and IV Lasix for volume overload -Currently on Lasix 20 mg p.o. twice daily -Diuresed more than liter after he received 1 dose of Lasix 20 mg IV this morning -Will discontinue p.o. Lasix and start Lasix 20 mg IV twice daily -Net -22 L -Continue strict intake and output  Scrotal cellulitis -Antibiotics course completed on 12/10  Hematuria -Has history of prostate cancer -Patient had Foley catheter which was removed on 12/12 as per patient request -Patient was able to void after that, currently has urostomy bag; patient's scrotum is still significantly swollen -Urology recommends outpatient cystoscopy for hematuria workup  Diabetes mellitus type 2 -Continue sliding scale insulin with NovoLog -CBG well-controlled  Stage II sacral ulcer -Continue wound care  Hypokalemia - potassium is 3.2 today - replace potassium and follow bmp in am  Left lower extremity phantom pain -Continue gabapentin  New onset thrombocytopenia -Platelet count is went down, hematology was consulted -Likely due to viral suppression from sepsis, consumption, DIC and polypharmacy -Recommended conservative management -No active bleeding reported per patient -Eliquis was briefly held -Adamts13 activity marginally low -Eliquis was resumed; platelet count is up 101,000  Normocytic anemia -Started on IV iron therapy as well as C78 and folic acid -Hematology was consulted' felt due to phlebotomy, chronic disease, sepsis syndrome and iron deficiency -Hemoglobin is stable at 9.3 -Follow CBC in a.m.    Medications     apixaban  5 mg Oral BID   atorvastatin  80 mg Oral Daily   B-complex with vitamin C  1 tablet Oral Daily   Chlorhexidine Gluconate  Cloth  6 each Topical Daily   clopidogrel  75 mg Oral Daily   vitamin B-12  1,000 mcg Oral Daily   feeding supplement  237 mL Oral TID BM   ferrous  sulfate  325 mg Oral Q breakfast   finasteride  5 mg Oral Daily   furosemide  20 mg Intravenous Q12H   gabapentin  300 mg Oral q1800   gabapentin  300 mg Oral Daily   hydrocortisone  20 mg Oral Daily   And   hydrocortisone  10 mg Oral Daily   insulin aspart  0-5 Units Subcutaneous QHS   insulin aspart  0-9 Units Subcutaneous TID WC   lidocaine  1 patch Transdermal Daily   melatonin  5 mg Oral q1800   midodrine  10 mg Oral TID with meals   nystatin   Topical BID   pantoprazole  40 mg Oral Daily   senna-docusate  1 tablet Oral BID   sodium chloride flush  10-40 mL Intracatheter Q12H   tamsulosin  0.4 mg Oral Daily   traZODone  50 mg Oral q1800   zolpidem  5 mg Oral q1800     Data Reviewed:   CBG:  Recent Labs  Lab 10/13/22 1129 10/13/22 1704 10/13/22 2045 10/14/22 0734 10/14/22 1215  GLUCAP 196* 225* 211* 168* 184*    SpO2: 98 % FiO2 (%): 21 %    Vitals:   10/13/22 2046 10/14/22 0520 10/14/22 1217 10/14/22 1219  BP: 117/83 121/82  102/67  Pulse: 65 70 70 65  Resp: '13 11 17 18  '$ Temp: 97.9 F (36.6 C) 97.8 F (36.6 C)  98.2 F (36.8 C)  TempSrc: Oral Oral  Oral  SpO2: 98% 97% 99% 98%  Weight:      Height:          Data Reviewed:  Basic Metabolic Panel: Recent Labs  Lab 10/09/22 0311 10/11/22 1830 10/13/22 0204  NA 140 137 136  K 3.7 3.2* 3.7  CL 108 106 105  CO2 '25 23 24  '$ GLUCOSE 212* 320* 227*  BUN 30* 28* 27*  CREATININE 1.04 1.05 0.92  CALCIUM 8.2* 8.1* 8.4*  MG 1.9  --   --   PHOS 3.5  --   --     CBC: Recent Labs  Lab 10/09/22 0311 10/11/22 1830  WBC 4.9 5.4  HGB 8.6* 9.3*  HCT 29.7* 31.7*  MCV 81.8 81.9  PLT 101* 138*    LFT Recent Labs  Lab 10/09/22 0311  ALBUMIN 2.6*     Antibiotics: Anti-infectives (From admission, onward)    Start     Dose/Rate Route Frequency Ordered Stop   09/05/22 1600  ertapenem (INVANZ) 1,000 mg in sodium chloride 0.9 % 100 mL IVPB        1 g 200 mL/hr over 30 Minutes Intravenous Every 24  hours 09/05/22 1357 09/30/22 0601   09/03/22 0800  meropenem (MERREM) 1 g in sodium chloride 0.9 % 100 mL IVPB  Status:  Discontinued        1 g 200 mL/hr over 30 Minutes Intravenous Every 8 hours 09/03/22 0201 09/05/22 1357   09/01/22 1100  meropenem (MERREM) 1 g in sodium chloride 0.9 % 100 mL IVPB  Status:  Discontinued        1 g 200 mL/hr over 30 Minutes Intravenous Every 12 hours 09/01/22 0416 09/03/22 0201   09/01/22 1100  fluconazole (DIFLUCAN) IVPB 200 mg  Status:  Discontinued  200 mg 100 mL/hr over 60 Minutes Intravenous Every 24 hours 09/01/22 1020 09/04/22 1309   09/01/22 0600  meropenem (MERREM) 1 g in sodium chloride 0.9 % 100 mL IVPB  Status:  Discontinued        1 g 200 mL/hr over 30 Minutes Intravenous Every 8 hours 08/31/22 2136 09/01/22 0416   08/31/22 2200  sulfamethoxazole-trimethoprim (BACTRIM DS) 800-160 MG per tablet 1 tablet  Status:  Discontinued        1 tablet Oral 2 times daily 08/31/22 2011 08/31/22 2129   08/31/22 2200  amoxicillin (AMOXIL) capsule 500 mg  Status:  Discontinued        500 mg Oral Every 8 hours 08/31/22 2131 09/03/22 1417   08/31/22 2145  meropenem (MERREM) 1 g in sodium chloride 0.9 % 100 mL IVPB        1 g 200 mL/hr over 30 Minutes Intravenous  Once 08/31/22 2136 09/01/22 0820   08/31/22 1815  cefTRIAXone (ROCEPHIN) 1 g in sodium chloride 0.9 % 100 mL IVPB        1 g 200 mL/hr over 30 Minutes Intravenous  Once 08/31/22 1814 08/31/22 1911        DVT prophylaxis: Eliquis  Code Status: DNR  Family Communication:    CONSULTS    Objective    Physical Examination:  Appears in no acute distress S1-S2, regular, no murmur auscultated Clear to auscultation bilaterally Trace edema in the lower extremities  Status is: Inpatient:      Pressure Injury 08/03/22 Buttocks Mid Stage 2 -  Partial thickness loss of dermis presenting as a shallow open injury with a red, pink wound bed without slough. area of red blanchable and non  blanchable, and areas with skin sloughed off (Active)  08/03/22 1513  Location: Buttocks  Location Orientation: Mid  Staging: Stage 2 -  Partial thickness loss of dermis presenting as a shallow open injury with a red, pink wound bed without slough.  Wound Description (Comments): area of red blanchable and non blanchable, and areas with skin sloughed off  Present on Admission: Yes        [place]   [facility] If 7PM-7AM, please contact night-coverage at [email or web address], Office  [phone]   10/14/2022, 2:13 PM  LOS: 43 days

## 2022-10-15 DIAGNOSIS — N179 Acute kidney failure, unspecified: Secondary | ICD-10-CM | POA: Diagnosis not present

## 2022-10-15 DIAGNOSIS — R310 Gross hematuria: Secondary | ICD-10-CM | POA: Diagnosis not present

## 2022-10-15 DIAGNOSIS — N492 Inflammatory disorders of scrotum: Secondary | ICD-10-CM | POA: Diagnosis not present

## 2022-10-15 DIAGNOSIS — I2510 Atherosclerotic heart disease of native coronary artery without angina pectoris: Secondary | ICD-10-CM | POA: Diagnosis not present

## 2022-10-15 LAB — BASIC METABOLIC PANEL
Anion gap: 6 (ref 5–15)
BUN: 24 mg/dL — ABNORMAL HIGH (ref 8–23)
CO2: 27 mmol/L (ref 22–32)
Calcium: 8.2 mg/dL — ABNORMAL LOW (ref 8.9–10.3)
Chloride: 102 mmol/L (ref 98–111)
Creatinine, Ser: 0.82 mg/dL (ref 0.61–1.24)
GFR, Estimated: >60 mL/min (ref >60)
Glucose, Bld: 214 mg/dL — ABNORMAL HIGH (ref 70–99)
Potassium: 3.5 mmol/L (ref 3.5–5.1)
Sodium: 135 mmol/L (ref 135–145)

## 2022-10-15 LAB — GLUCOSE, CAPILLARY
Glucose-Capillary: 168 mg/dL — ABNORMAL HIGH (ref 70–99)
Glucose-Capillary: 195 mg/dL — ABNORMAL HIGH (ref 70–99)
Glucose-Capillary: 309 mg/dL — ABNORMAL HIGH (ref 70–99)
Glucose-Capillary: 281 mg/dL — ABNORMAL HIGH (ref 70–99)

## 2022-10-15 NOTE — Progress Notes (Signed)
[facility]  PROGRESS NOTE  Peter Yang HGD:[id number] DOB: Jan 14, 1956 DOA: 08/31/2022 PCP: Clovia Cuff, MD   Brief HPI:   66 year old man who presented to the [facility] ED 11/11 with a chief complaint of hematuria. PMHx significant for HTN, HLD, HFrEF/ICM EF 40-45%, CAD/NSTEMI, TIA, PAF (on Eliquis), T2DM, BPH (hx prostate CA). Patient with questionable length of time of hematuria which may have been going on since 10/13 per patient.  Sent to the emergency department by SNF staff on 11/11. 11/11 Admit by TRH, BC>, UC>, PCCM consult.  During his course patient has been seen by urology.  ID was also consulted patient was on IV antibiotics for 4 weeks.  Palliative care was consulted for symptom management and goals of care.    Subjective   Patient seen and examined, breathing has improved since starting IV Lasix.  He is -25 L net.   Assessment/Plan:    Septic shock -Sepsis physiology has resolved -Patient had recent hospitalization for prostatitis with ESBL E. coli and Proteus mirabilis, treated with 6 weeks of Bactrim -Presented with hematuria and CT finding concerning for cysto prostatitis -Antibiotics were changed from Bactrim to meropenem and amoxicillin -ID was consulted, recommended to continue with 4 weeks of meropenem; antibiotics completed on 09/29/2022 Ideally patient should have The Foley catheter although this was removed as he was worried that the Foley catheter is responsible for the scrotal swelling. -Currently patient is ostomy bag with suction  Paroxysmal atrial fibrillation -Heart rate is controlled -Continue Eliquis for anticoagulation  Acute on chronic HFrEF -Ischemic cardiomyopathy, CAD s/p PCI - concern for cardiogenic shock, cardiology was consulted -Not a candidate for home inotropes, revascularization or transplant or LVAD -Unable to tolerate GDMT due to hypotension -Patient is currently on Eliquis and Plavix -Treated with IV albumin and IV Lasix  for volume overload -Currently on Lasix 20 mg p.o. twice daily -Diuresed more than liter after he received 1 dose of Lasix 20 mg IV this morning -Will discontinue p.o. Lasix and start Lasix 20 mg IV twice daily -Net -25 L -Continue strict intake and output -Can be switched to Lasix 20 mg p.o. twice daily on discharge  Scrotal cellulitis -Antibiotics course completed on 12/10  Hematuria -Has history of prostate cancer -Patient had Foley catheter which was removed on 12/12 as per patient request -Patient was able to void after that, currently has urostomy bag; patient's scrotum is still significantly swollen -Urology recommends outpatient cystoscopy for hematuria workup  Diabetes mellitus type 2 -Continue sliding scale insulin with NovoLog -CBG well-controlled  Stage II sacral ulcer -Continue wound care  Hypokalemia -Replete  Left lower extremity phantom pain -Continue gabapentin  New onset thrombocytopenia -Platelet count is went down, hematology was consulted -Likely due to viral suppression from sepsis, consumption, DIC and polypharmacy -Recommended conservative management -No active bleeding reported per patient -Eliquis was briefly held -Adamts13 activity marginally low -Eliquis was resumed; platelet count is up 101,000  Normocytic anemia -Started on IV iron therapy as well as D62 and folic acid -Hematology was consulted' felt due to phlebotomy, chronic disease, sepsis syndrome and iron deficiency -Hemoglobin is stable at 9.3 -Follow CBC in a.m.    Medications     apixaban  5 mg Oral BID   atorvastatin  80 mg Oral Daily   B-complex with vitamin C  1 tablet Oral Daily   Chlorhexidine Gluconate Cloth  6 each Topical Daily   clopidogrel  75 mg Oral Daily   vitamin B-12  1,000 mcg Oral Daily  feeding supplement  237 mL Oral TID BM   ferrous sulfate  325 mg Oral Q breakfast   finasteride  5 mg Oral Daily   furosemide  20 mg Intravenous Q12H   gabapentin  300  mg Oral q1800   gabapentin  300 mg Oral Daily   hydrocortisone  20 mg Oral Daily   And   hydrocortisone  10 mg Oral Daily   insulin aspart  0-5 Units Subcutaneous QHS   insulin aspart  0-9 Units Subcutaneous TID WC   lidocaine  1 patch Transdermal Daily   melatonin  5 mg Oral q1800   midodrine  10 mg Oral TID with meals   nystatin   Topical BID   pantoprazole  40 mg Oral Daily   senna-docusate  1 tablet Oral BID   sodium chloride flush  10-40 mL Intracatheter Q12H   tamsulosin  0.4 mg Oral Daily   traZODone  50 mg Oral q1800   zolpidem  5 mg Oral q1800     Data Reviewed:   CBG:  Recent Labs  Lab 10/14/22 1215 10/14/22 1649 10/14/22 2045 10/15/22 0742 10/15/22 1155  GLUCAP 184* 241* 233* 168* 195*    SpO2: 97 % FiO2 (%): 21 %    Vitals:   10/15/22 0424 10/15/22 0948 10/15/22 1158 10/15/22 1403  BP: 112/75 97/60 107/80 96/65  Pulse: 66 69 67 70  Resp: '14 16 16   '$ Temp: 98 F (36.7 C) 98.2 F (36.8 C) 98.1 F (36.7 C)   TempSrc: Oral Oral Oral   SpO2: 99% 97% 97%   Weight:      Height:          Data Reviewed:  Basic Metabolic Panel: Recent Labs  Lab 10/09/22 0311 10/11/22 1830 10/13/22 0204 10/15/22 0316  NA 140 137 136 135  K 3.7 3.2* 3.7 3.5  CL 108 106 105 102  CO2 '25 23 24 27  '$ GLUCOSE 212* 320* 227* 214*  BUN 30* 28* 27* 24*  CREATININE 1.04 1.05 0.92 0.82  CALCIUM 8.2* 8.1* 8.4* 8.2*  MG 1.9  --   --   --   PHOS 3.5  --   --   --     CBC: Recent Labs  Lab 10/09/22 0311 10/11/22 1830  WBC 4.9 5.4  HGB 8.6* 9.3*  HCT 29.7* 31.7*  MCV 81.8 81.9  PLT 101* 138*    LFT Recent Labs  Lab 10/09/22 0311  ALBUMIN 2.6*     Antibiotics: Anti-infectives (From admission, onward)    Start     Dose/Rate Route Frequency Ordered Stop   09/05/22 1600  ertapenem (INVANZ) 1,000 mg in sodium chloride 0.9 % 100 mL IVPB        1 g 200 mL/hr over 30 Minutes Intravenous Every 24 hours 09/05/22 1357 09/30/22 0601   09/03/22 0800  meropenem  (MERREM) 1 g in sodium chloride 0.9 % 100 mL IVPB  Status:  Discontinued        1 g 200 mL/hr over 30 Minutes Intravenous Every 8 hours 09/03/22 0201 09/05/22 1357   09/01/22 1100  meropenem (MERREM) 1 g in sodium chloride 0.9 % 100 mL IVPB  Status:  Discontinued        1 g 200 mL/hr over 30 Minutes Intravenous Every 12 hours 09/01/22 0416 09/03/22 0201   09/01/22 1100  fluconazole (DIFLUCAN) IVPB 200 mg  Status:  Discontinued        200 mg 100 mL/hr over 60 Minutes  Intravenous Every 24 hours 09/01/22 1020 09/04/22 1309   09/01/22 0600  meropenem (MERREM) 1 g in sodium chloride 0.9 % 100 mL IVPB  Status:  Discontinued        1 g 200 mL/hr over 30 Minutes Intravenous Every 8 hours 08/31/22 2136 09/01/22 0416   08/31/22 2200  sulfamethoxazole-trimethoprim (BACTRIM DS) 800-160 MG per tablet 1 tablet  Status:  Discontinued        1 tablet Oral 2 times daily 08/31/22 2011 08/31/22 2129   08/31/22 2200  amoxicillin (AMOXIL) capsule 500 mg  Status:  Discontinued        500 mg Oral Every 8 hours 08/31/22 2131 09/03/22 1417   08/31/22 2145  meropenem (MERREM) 1 g in sodium chloride 0.9 % 100 mL IVPB        1 g 200 mL/hr over 30 Minutes Intravenous  Once 08/31/22 2136 09/01/22 0820   08/31/22 1815  cefTRIAXone (ROCEPHIN) 1 g in sodium chloride 0.9 % 100 mL IVPB        1 g 200 mL/hr over 30 Minutes Intravenous  Once 08/31/22 1814 08/31/22 1911        DVT prophylaxis: Eliquis  Code Status: DNR  Family Communication:    CONSULTS    Objective    Physical Examination:   Appears in no acute distress S1-S2, regular Lungs are clear to auscultation bilaterally, no wheezing or crackles auscultated Abdomen is soft, nontender Extremities, no edema noted  Status is: Inpatient:      Pressure Injury 08/03/22 Buttocks Mid Stage 2 -  Partial thickness loss of dermis presenting as a shallow open injury with a red, pink wound bed without slough. area of red blanchable and non blanchable, and  areas with skin sloughed off (Active)  08/03/22 1513  Location: Buttocks  Location Orientation: Mid  Staging: Stage 2 -  Partial thickness loss of dermis presenting as a shallow open injury with a red, pink wound bed without slough.  Wound Description (Comments): area of red blanchable and non blanchable, and areas with skin sloughed off  Present on Admission: Yes        [place]   [facility] If 7PM-7AM, please contact night-coverage at [email or web address], Office  [phone]   10/15/2022, 2:12 PM  LOS: 44 days

## 2022-10-15 NOTE — Progress Notes (Signed)
Physical Therapy Treatment Patient Details Name: Peter Yang MRN: [id number] DOB: Mar 22, 1956 Today's Date: 10/15/2022   History of Present Illness Pt is a 66 year old man admitted from SNF with gross hematuria, UTI and sepsis on 11/11. PMH: CAD, NSTEMI, AAA, TIA, PAF, DM, diabetic neuropathy, L BKA, HTN, HLD, CHF with EF of 40-45%, recent admissions with sepsis, UTI, hypoglycemia and recent COVID. Pt has also had several recetn hosptializations since August this year.    PT Comments    Mod assist for supine to sit. Pt sat at edge of bed for 14 minutes without loss of balance. He declined attempting sit to stand 2* feeling poorly, especially having a headache, pain meds requested. Reviewed knee extension exercises, encouraged pt to perform L quad sets independently, he verbalized and demonstrated understanding of technique.    Recommendations for follow up therapy are one component of a multi-disciplinary discharge planning process, led by the attending physician.  Recommendations may be updated based on patient status, additional functional criteria and insurance authorization.  Follow Up Recommendations  Skilled nursing-short term rehab (<3 hours/day) Can patient physically be transported by private vehicle: No   Assistance Recommended at Discharge Frequent or constant Supervision/Assistance  Patient can return home with the following Two people to help with walking and/or transfers;Help with stairs or ramp for entrance;A little help with bathing/dressing/bathroom;Assistance with cooking/housework;Assist for transportation   Equipment Recommendations  None recommended by PT    Recommendations for Other Services       Precautions / Restrictions Precautions Precautions: Fall Precaution Comments: L BKA with prosthesis in room, wear shrinker and monitor BP Restrictions Weight Bearing Restrictions: No     Mobility  Bed Mobility Overal bed mobility: Needs Assistance Bed Mobility:  Supine to Sit     Supine to sit: Mod assist Sit to supine: Mod assist   General bed mobility comments: assist to raise trunk, assist for BLEs into bed. Pt sat EOB x 14 minutes    Transfers                   General transfer comment: pt declined transfers 2* feeling poorly, esp HA    Ambulation/Gait                   Stairs             Wheelchair Mobility    Modified Rankin (Stroke Patients Only)       Balance   Sitting-balance support: Bilateral upper extremity supported, Feet supported Sitting balance-Leahy Scale: Fair                                      Cognition Arousal/Alertness: Awake/alert Behavior During Therapy: WFL for tasks assessed/performed Overall Cognitive Status: Within Functional Limits for tasks assessed                                          Exercises General Exercises - Lower Extremity Long Arc Quad: AROM, Both, 10 reps, Seated    General Comments        Pertinent Vitals/Pain Pain Assessment Pain Score: 8  Pain Location: headache Pain Descriptors / Indicators: Aching, Guarding Pain Intervention(s): Limited activity within patient's tolerance, Monitored during session, Patient requesting pain meds-RN notified    Home Living  Prior Function            PT Goals (current goals can now be found in the care plan section) Acute Rehab PT Goals Patient Stated Goal: to get better and return home PT Goal Formulation: With patient Time For Goal Achievement: 10/24/22 Potential to Achieve Goals: Fair Progress towards PT goals: Progressing toward goals    Frequency    Min 2X/week      PT Plan Current plan remains appropriate    Co-evaluation              AM-PAC PT "6 Clicks" Mobility   Outcome Measure  Help needed turning from your back to your side while in a flat bed without using bedrails?: A Little Help needed moving from lying  on your back to sitting on the side of a flat bed without using bedrails?: A Lot Help needed moving to and from a bed to a chair (including a wheelchair)?: A Lot Help needed standing up from a chair using your arms (e.g., wheelchair or bedside chair)?: Total Help needed to walk in hospital room?: Total Help needed climbing 3-5 steps with a railing? : Total 6 Click Score: 10    End of Session Equipment Utilized During Treatment: Gait belt Activity Tolerance: Patient tolerated treatment well Patient left: in bed;with bed alarm set;with call bell/phone within reach Nurse Communication: Mobility status;Patient requests pain meds PT Visit Diagnosis: Other abnormalities of gait and mobility (R26.89);Muscle weakness (generalized) (M62.81)     Time: 8295-6213 PT Time Calculation (min) (ACUTE ONLY): 19 min  Charges:  $Therapeutic Activity: 8-22 mins                     Blondell Reveal Kistler PT 10/15/2022  Acute Rehabilitation Services  Office [phone]

## 2022-10-15 NOTE — TOC Progression Note (Addendum)
Transition of Care [facility]) - Progression Note    Patient Details  Name: Peter Yang MRN: [id number] Date of Birth: 1955/12/16  Transition of Care [facility]) CM/SW Contact  Sophee Mckimmy, Juliann Pulse, RN Phone Number: 10/15/2022, 11:39 AM  Clinical Narrative:Awaiting bed choice, & PT note-MD notified.  -11:54a-await choice on facility or to confirm [facility] is the choice.  -1p-[facility] rep [facility] initiating auth-await auth.     Expected Discharge Plan: [place] Barriers to Discharge: Continued Medical Work up  Expected Discharge Plan and Services   Discharge Planning Services: CM Consult   Living arrangements for the past 2 months: [place] Determinants of Health (SDOH) Interventions [place]: No Food Insecurity (09/07/2022)  Housing: Low Risk  (09/07/2022)  Transportation Needs: Unmet Transportation Needs (09/07/2022)  Utilities: At Risk (09/07/2022)  Tobacco Use: Low Risk  (09/02/2022)    Readmission Risk Interventions    09/02/2022   10:36 AM  Readmission Risk Prevention Plan  Transportation Screening Complete  Medication Review ([place]) Complete  PCP or Specialist appointment within 3-5 days of discharge Complete  HRI or [place] Complete  SW Recovery Care/Counseling Consult Complete  [place] Complete

## 2022-10-16 LAB — BASIC METABOLIC PANEL
Anion gap: 6 (ref 5–15)
BUN: 25 mg/dL — ABNORMAL HIGH (ref 8–23)
CO2: 29 mmol/L (ref 22–32)
Calcium: 8 mg/dL — ABNORMAL LOW (ref 8.9–10.3)
Chloride: 101 mmol/L (ref 98–111)
Creatinine, Ser: 0.93 mg/dL (ref 0.61–1.24)
GFR, Estimated: >60 mL/min (ref >60)
Glucose, Bld: 205 mg/dL — ABNORMAL HIGH (ref 70–99)
Potassium: 3.3 mmol/L — ABNORMAL LOW (ref 3.5–5.1)
Sodium: 136 mmol/L (ref 135–145)

## 2022-10-16 LAB — GLUCOSE, CAPILLARY
Glucose-Capillary: 203 mg/dL — ABNORMAL HIGH (ref 70–99)
Glucose-Capillary: 187 mg/dL — ABNORMAL HIGH (ref 70–99)
Glucose-Capillary: 215 mg/dL — ABNORMAL HIGH (ref 70–99)
Glucose-Capillary: 209 mg/dL — ABNORMAL HIGH (ref 70–99)

## 2022-10-16 MED ORDER — POTASSIUM CHLORIDE CRYS ER 20 MEQ PO TBCR
40.0000 meq | EXTENDED_RELEASE_TABLET | Freq: Once | ORAL | Status: AC
  Administered 2022-10-16: 40 meq via ORAL
  Filled 2022-10-16: qty 2

## 2022-10-16 NOTE — Plan of Care (Signed)
  Problem: Education: Goal: Ability to describe self-care measures that may prevent or decrease complications (Diabetes Survival Skills Education) will improve Outcome: Progressing Goal: Individualized Educational Video(s) Outcome: Progressing   Problem: Coping: Goal: Ability to adjust to condition or change in health will improve Outcome: Progressing   

## 2022-10-16 NOTE — Progress Notes (Signed)
Consultation Progress Note   Patient: Peter Yang ZOX:[id number] DOB: 1956-09-13 DOA: 08/31/2022 DOS: the patient was seen and examined on 10/16/2022 Primary service: Maxton Noreen, Manfred Shirts, MD  Brief hospital course: 66 year old man who presented to the [facility] ED 11/11 with a chief complaint of hematuria. PMHx significant for HTN, HLD, HFrEF/ICM EF 40-45%, CAD/NSTEMI, TIA, PAF (on Eliquis), T2DM, BPH (hx prostate CA). Patient with questionable length of time of hematuria which may have been going on since 10/13 per patient.  Sent to the emergency department by SNF staff on 11/11. 11/11 Admit by TRH, BC>, UC>, PCCM consult.  During his course patient has been seen by urology.  ID was also consulted patient was on IV antibiotics for 4 weeks.  Palliative care was consulted for symptom management and goals of care.  Assessment and Plan: Septic shock -Sepsis physiology has resolved -Patient had recent hospitalization for prostatitis with ESBL E. coli and Proteus mirabilis, treated with 6 weeks of Bactrim -Presented with hematuria and CT finding concerning for cysto prostatitis -Antibiotics were changed from Bactrim to meropenem and amoxicillin -ID was consulted, recommended to continue with 4 weeks of meropenem; antibiotics completed on 09/29/2022 Ideally patient should have The Foley catheter although this was removed as he was worried that the Foley catheter is responsible for the scrotal swelling. -Currently patient is ostomy bag with suction   Paroxysmal atrial fibrillation -Heart rate is controlled -Continue Eliquis for anticoagulation   Acute on chronic HFrEF -Ischemic cardiomyopathy, CAD s/p PCI - concern for cardiogenic shock, cardiology was consulted -Not a candidate for home inotropes, revascularization or transplant or LVAD -Unable to tolerate GDMT due to hypotension -Patient is currently on Eliquis and Plavix -Treated with IV albumin and IV Lasix for volume overload -Currently on  Lasix 20 mg p.o. twice daily -Diuresed more than liter after he received 1 dose of Lasix 20 mg IV this morning -Will discontinue p.o. Lasix and start Lasix 20 mg IV twice daily -Net -25 L -Continue strict intake and output -Can be switched to Lasix 20 mg p.o. twice daily on discharge   Scrotal cellulitis -Antibiotics course completed on 12/10   Hematuria -Has history of prostate cancer -Patient had Foley catheter which was removed on 12/12 as per patient request -Patient was able to void after that, currently has urostomy bag; patient's scrotum is still significantly swollen -Urology recommends outpatient cystoscopy for hematuria workup   Diabetes mellitus type 2 -Continue sliding scale insulin with NovoLog -CBG well-controlled   Stage II sacral ulcer -Continue wound care   Hypokalemia -Replete   Left lower extremity phantom pain -Continue gabapentin   New onset thrombocytopenia -Platelet count is went down, hematology was consulted -Likely due to viral suppression from sepsis, consumption, DIC and polypharmacy -Recommended conservative management -No active bleeding reported per patient -Eliquis was briefly held -Adamts13 activity marginally low -Eliquis was resumed; platelet count is up 101,000   Normocytic anemia -Started on IV iron therapy as well as W11 and folic acid -Hematology was consulted' felt due to phlebotomy, chronic disease, sepsis syndrome and iron deficiency -Hemoglobin is stable at 9.3 -Follow CBC in a.m.          TRH will continue to follow the patient.  Subjective: Seen at bedside this morning, no acute events last night. He reports having poor sleep, requesting sleep aid for tonight.  Physical Exam: Vitals:   10/15/22 1906 10/15/22 2015 10/16/22 0241 10/16/22 0520  BP: 122/78 121/71 (!) 98/59 111/66  Pulse: 71 72 69 66  Resp: '18 16  16  '$ Temp: 98.2 F (36.8 C)  97.8 F (36.6 C) 98.2 F (36.8 C)  TempSrc: Oral  Oral Oral  SpO2: 97%  98% 97% 96%  Weight:      Height:      Appears in no acute distress S1-S2, regular Lungs are clear to auscultation bilaterally, no wheezing or crackles auscultated Abdomen is soft, nontender Extremities, no edema noted  Data Reviewed:  There are no new results to review at this time.  Family Communication:   Time spent: 15 minutes.  Author: Cristela Felt, MD 10/16/2022 10:26 AM  For on call review [email or web address].

## 2022-10-17 LAB — GLUCOSE, CAPILLARY
Glucose-Capillary: 159 mg/dL — ABNORMAL HIGH (ref 70–99)
Glucose-Capillary: 222 mg/dL — ABNORMAL HIGH (ref 70–99)
Glucose-Capillary: 201 mg/dL — ABNORMAL HIGH (ref 70–99)
Glucose-Capillary: 228 mg/dL — ABNORMAL HIGH (ref 70–99)

## 2022-10-17 NOTE — Progress Notes (Signed)
Consultation Progress Note   Patient: Peter Yang [id number]:[id number] DOB: 28-Nov-1955 DOA: 08/31/2022 DOS: the patient was seen and examined on 10/17/2022 Primary service: DibiaManfred Shirts, MD  Brief hospital course: 66 year old man who presented to the [facility] ED 11/11 with a chief complaint of hematuria. PMHx significant for HTN, HLD, HFrEF/ICM EF 40-45%, CAD/NSTEMI, TIA, PAF (on Eliquis), T2DM, BPH (hx prostate CA). Patient with questionable length of time of hematuria which may have been going on since 10/13 per patient.  Sent to the emergency department by SNF staff on 11/11. 11/11 Admit by TRH, BC>, UC>, PCCM consult.  During his course patient has been seen by urology.  ID was also consulted patient was on IV antibiotics for 4 weeks.  Palliative care was consulted for symptom management and goals of care.  Assessment and Plan: * Gross hematuria Gross hematuria with clots and ? Obstructive uropathy. Urology consulted and saw patient EDP unable to get a 3 way catheter placed, was able to get a smaller foley placed Urine cleared up after manual irrigation by urology Will need manual bladder irrigations every couple of hours  Acute kidney injury ([place]) Pre renal / ATN vs post renal / obstructive uropathy in setting of gross hematuria with clots requiring foley placement and irrigation. Strict intake and output Tele monitor Repeat BMP in AM IVF: 2L bolus in ED + 125 cc/hr  UTI (urinary tract infection) Continue bactrim DS for 3 more days for ESBL E.Coli + carbapenem resistant proteus UTI (completing 6 week course) If pt gets septic / febrile / lactic acid doesn't improve post IVF: DC bactrim Put pt back on carbapenem + amoxicllin (proteus was apparently sensitive to amoxicllin on cultures, see HPI above).  Borderline low blood pressure determined by examination Pt with low BPs during recent admit to [facility], got started on midodrine that admit. Continue home midodrine for now.  Lactic  acidosis Repeating lactate to see if this cleared after IVF Got 2L IVF bolus in ED Cont IVF at 125 cc/hr  Diabetic polyneuropathy associated with type 2 diabetes mellitus ([place]) Hold metformin given AKI and lactic acidosis. SSI sensitive Q4H  Acute on chronic HFrEF (heart failure with reduced ejection fraction) (HCC) EF 40-45%  PAF (paroxysmal atrial fibrillation) ([place]) Hold eliquis due to gross hematuria with clots  Pressure ulcer of sacral region, stage 2 ([place]) Wound care consult  Dyslipidemia Continue statin  Cellulitis of scrotum Only minimal pain though per patient. No crepitus on exam. I assume Dr. Gloriann Loan was less impressed for any sort of fournier's picture as he was just in to see patient and manually flushing patients foley a couple of hours ago. ABx as above Check MRSA PCR nares        TRH will continue to follow the patient.  Subjective: Complains of headaches this morning  Awaiting placement  Physical Exam: Vitals:   10/16/22 0520 10/16/22 1531 10/16/22 2000 10/17/22 0529  BP: 111/66 102/68 108/75 (!) 143/108  Pulse: 66 68 71 68  Resp: '16 14 20 20  '$ Temp: 98.2 F (36.8 C) 99 F (37.2 C) 98.2 F (36.8 C) 98.3 F (36.8 C)  TempSrc: Oral Oral Oral Oral  SpO2: 96% 95% 97% 96%  Weight:      Height:      Appears in no acute distress S1-S2, regular Lungs are clear to auscultation bilaterally, no wheezing or crackles auscultated Abdomen is soft, nontender Extremities, no edema noted  Data Reviewed:  There are no new results to review at this  time.  Family Communication:   Time spent: 15 minutes.  Author: Cristela Felt, MD 10/17/2022 10:37 AM  For on call review [email or web address].

## 2022-10-17 NOTE — TOC Progression Note (Signed)
Transition of Care [facility]) - Progression Note    Patient Details  Name: Peter Yang MRN: [id number] Date of Birth: [birth year]  [facility]) CM/SW Contact  Horris Speros, Juliann Pulse, RN Phone Number: 10/17/2022, 2:56 PM  Clinical Narrative: [facility] denied [name] out of network. Patient aware will provide bed offers again for patient to choose-informed him that the facility checks if in network with his insurance. Await choice prior facility getting auth.      Expected Discharge Plan: [place] Barriers to Discharge: Continued Medical Work up  Expected Discharge Plan and Services   Discharge Planning Services: CM Consult   Living arrangements for the past 2 months: [place] Determinants of Health (SDOH) Interventions [place]: No Food Insecurity (09/07/2022)  Housing: Low Risk  (09/07/2022)  Transportation Needs: Unmet Transportation Needs (09/07/2022)  Utilities: At Risk (09/07/2022)  Tobacco Use: Low Risk  (09/02/2022)    Readmission Risk Interventions    09/02/2022   10:36 AM  Readmission Risk Prevention Plan  Transportation Screening Complete  Medication Review ([place]) Complete  PCP or Specialist appointment within 3-5 days of discharge Complete  HRI or [place] Complete  SW Recovery Care/Counseling Consult Complete  [place] Complete

## 2022-10-18 LAB — GLUCOSE, CAPILLARY
Glucose-Capillary: 183 mg/dL — ABNORMAL HIGH (ref 70–99)
Glucose-Capillary: 193 mg/dL — ABNORMAL HIGH (ref 70–99)
Glucose-Capillary: 208 mg/dL — ABNORMAL HIGH (ref 70–99)
Glucose-Capillary: 222 mg/dL — ABNORMAL HIGH (ref 70–99)

## 2022-10-18 LAB — BASIC METABOLIC PANEL
Anion gap: 6 (ref 5–15)
BUN: 24 mg/dL — ABNORMAL HIGH (ref 8–23)
CO2: 28 mmol/L (ref 22–32)
Calcium: 8.1 mg/dL — ABNORMAL LOW (ref 8.9–10.3)
Chloride: 103 mmol/L (ref 98–111)
Creatinine, Ser: 0.9 mg/dL (ref 0.61–1.24)
GFR, Estimated: >60 mL/min (ref >60)
Glucose, Bld: 210 mg/dL — ABNORMAL HIGH (ref 70–99)
Potassium: 3.4 mmol/L — ABNORMAL LOW (ref 3.5–5.1)
Sodium: 137 mmol/L (ref 135–145)

## 2022-10-18 MED ORDER — MORPHINE SULFATE (PF) 2 MG/ML IV SOLN
2.0000 mg | INTRAVENOUS | Status: DC | PRN
  Administered 2022-10-23: 2 mg via INTRAVENOUS
  Filled 2022-10-18: qty 1

## 2022-10-18 NOTE — Progress Notes (Signed)
Consultation Progress Note   Patient: Jahel Wavra YWV:[id number] DOB: [birth year] DOA: 08/31/2022 DOS: the patient was seen and examined on 10/18/2022 Primary service: Emmer Lillibridge, Manfred Shirts, MD  Brief hospital course: 66 year old man who presented to the [facility] ED 11/11 with a chief complaint of hematuria. PMHx significant for HTN, HLD, HFrEF/ICM EF 40-45%, CAD/NSTEMI, TIA, PAF (on Eliquis), T2DM, BPH (hx prostate CA). Patient with questionable length of time of hematuria which may have been going on since 10/13 per patient.  Sent to the emergency department by SNF staff on 11/11. 11/11 Admit by TRH, BC>, UC>, PCCM consult.  During his course patient has been seen by urology.  ID was also consulted patient was on IV antibiotics for 4 weeks.  Palliative care was consulted for symptom management and goals of care.   Assessment and Plan: * Gross hematuria Gross hematuria with clots and ? Obstructive uropathy. Urology consulted and saw patient EDP unable to get a 3 way catheter placed, was able to get a smaller foley placed Urine cleared up after manual irrigation by urology Will need manual bladder irrigations every couple of hours   Acute kidney injury ([place]) Pre renal / ATN vs post renal / obstructive uropathy in setting of gross hematuria with clots requiring foley placement and irrigation. Strict intake and output Tele monitor Repeat BMP in AM IVF: 2L bolus in ED + 125 cc/hr   UTI (urinary tract infection) Continue bactrim DS for 3 more days for ESBL E.Coli + carbapenem resistant proteus UTI (completing 6 week course) If pt gets septic / febrile / lactic acid doesn't improve post IVF: DC bactrim Put pt back on carbapenem + amoxicllin (proteus was apparently sensitive to amoxicllin on cultures, see HPI above).   Borderline low blood pressure determined by examination Pt with low BPs during recent admit to [facility], got started on midodrine that admit. Continue home midodrine for now.   Lactic  acidosis Repeating lactate to see if this cleared after IVF Got 2L IVF bolus in ED Cont IVF at 125 cc/hr   Diabetic polyneuropathy associated with type 2 diabetes mellitus ([place]) Hold metformin given AKI and lactic acidosis. SSI sensitive Q4H   Acute on chronic HFrEF (heart failure with reduced ejection fraction) (HCC) EF 40-45%   PAF (paroxysmal atrial fibrillation) ([place]) Hold eliquis due to gross hematuria with clots   Pressure ulcer of sacral region, stage 2 ([place]) Wound care consult   Dyslipidemia Continue statin   Cellulitis of scrotum Only minimal pain though per patient. No crepitus on exam. I assume Dr. Gloriann Loan was less impressed for any sort of fournier's picture as he was just in to see patient and manually flushing patients foley a couple of hours ago. ABx as above Check MRSA PCR nares    TRH will continue to follow the patient.    TRH will continue to follow the patient.  Subjective: Seen at bedside, complains of neck pain. He just received pain medication.   Awaits placement at SNF  Physical Exam: Vitals:   10/17/22 0529 10/17/22 1120 10/17/22 2007 10/18/22 0514  BP: (!) 143/108 105/67 112/78 (!) 131/94  Pulse: 68 65 66 67  Resp: '20 16 20 20  '$ Temp: 98.3 F (36.8 C) 97.6 F (36.4 C) 98.3 F (36.8 C) 98.1 F (36.7 C)  TempSrc: Oral Oral Oral Oral  SpO2: 96% 97% 97% 97%  Weight:      Height:      Appears in no acute distress S1-S2, regular Lungs are clear to  auscultation bilaterally, no wheezing or crackles auscultated Abdomen is soft, nontender Extremities, no edema noted  Data Reviewed:  There are no new results to review at this time.  Family Communication:   Time spent: 15 minutes.  Author: Cristela Felt, MD 10/18/2022 10:30 AM  For on call review [email or web address].

## 2022-10-18 NOTE — Progress Notes (Signed)
Physical Therapy Treatment Patient Details Name: Peter Yang MRN: [id number] DOB: 03/24/1956 Today's Date: 10/18/2022   History of Present Illness Pt is a 66 year old man admitted from SNF with gross hematuria, UTI and sepsis on 11/11. PMH: CAD, NSTEMI, AAA, TIA, PAF, DM, diabetic neuropathy, L BKA, HTN, HLD, CHF with EF of 40-45%, recent admissions with sepsis, UTI, hypoglycemia and recent COVID. Pt has also had several recetn hosptializations since August this year.    PT Comments    The patient participated in bed mobility, sitting on bed edge, performed U/LE exercises. Replaced shrinker to Left  residual limb.    Recommendations for follow up therapy are one component of a multi-disciplinary discharge planning process, led by the attending physician.  Recommendations may be updated based on patient status, additional functional criteria and insurance authorization.  Follow Up Recommendations  Skilled nursing-short term rehab (<3 hours/day) Can patient physically be transported by private vehicle: No   Assistance Recommended at Discharge Frequent or constant Supervision/Assistance  Patient can return home with the following Two people to help with walking and/or transfers;Help with stairs or ramp for entrance;A little help with bathing/dressing/bathroom;Assistance with cooking/housework;Assist for transportation   Equipment Recommendations  None recommended by PT    Recommendations for Other Services       Precautions / Restrictions Precautions Precaution Comments: L BKA with prosthesis in room, wear shrinker and monitor BP     Mobility  Bed Mobility   Bed Mobility: Supine to Sit, Sit to Supine     Supine to sit: Mod assist Sit to supine: Min assist   General bed mobility comments: assist to raise trunk, assist for BLEs into bed. Pt sat EOB x 9 minutes, able to retunr to supine    Transfers                        Ambulation/Gait                    Stairs             Wheelchair Mobility    Modified Rankin (Stroke Patients Only)       Balance Overall balance assessment: Needs assistance Sitting-balance support: Bilateral upper extremity supported, No upper extremity supported Sitting balance-Leahy Scale: Fair                                      Cognition Arousal/Alertness: Awake/alert Behavior During Therapy: WFL for tasks assessed/performed Overall Cognitive Status: Within Functional Limits for tasks assessed                                          Exercises General Exercises - Lower Extremity Short Arc Quad: AROM, Both, 10 reps, Supine Long Arc Quad: AROM, Right, 10 reps, Seated Hip ABduction/ADduction: AROM, Both, 10 reps, Supine Straight Leg Raises: AROM, Both, Supine, 10 reps Other Exercises Other Exercises: provided blue TB for UE and instructed    General Comments        Pertinent Vitals/Pain      Home Living                          Prior Function            PT  Goals (current goals can now be found in the care plan section) Progress towards PT goals: Progressing toward goals    Frequency    Min 2X/week      PT Plan Current plan remains appropriate    Co-evaluation              AM-PAC PT "6 Clicks" Mobility   Outcome Measure  Help needed turning from your back to your side while in a flat bed without using bedrails?: A Little Help needed moving from lying on your back to sitting on the side of a flat bed without using bedrails?: A Lot Help needed moving to and from a bed to a chair (including a wheelchair)?: A Little Help needed standing up from a chair using your arms (e.g., wheelchair or bedside chair)?: Total Help needed to walk in hospital room?: Total Help needed climbing 3-5 steps with a railing? : Total 6 Click Score: 11    End of Session   Activity Tolerance: Patient tolerated treatment well Patient left: in  bed;with bed alarm set;with call bell/phone within reach Nurse Communication: Mobility status;Patient requests pain meds PT Visit Diagnosis: Other abnormalities of gait and mobility (R26.89);Muscle weakness (generalized) (M62.81)     Time: 9379-0240 PT Time Calculation (min) (ACUTE ONLY): 39 min  Charges:  $Therapeutic Exercise: 8-22 mins $Therapeutic Activity: 8-22 mins $Self Care/Home Management: [place] Office [phone] Weekend [phone]    Claretha Cooper 10/18/2022, 4:52 PM

## 2022-10-19 DIAGNOSIS — D689 Coagulation defect, unspecified: Secondary | ICD-10-CM | POA: Diagnosis not present

## 2022-10-19 DIAGNOSIS — N1330 Unspecified hydronephrosis: Secondary | ICD-10-CM | POA: Diagnosis not present

## 2022-10-19 DIAGNOSIS — R310 Gross hematuria: Secondary | ICD-10-CM | POA: Diagnosis not present

## 2022-10-19 DIAGNOSIS — N179 Acute kidney failure, unspecified: Secondary | ICD-10-CM | POA: Diagnosis not present

## 2022-10-19 LAB — COMPREHENSIVE METABOLIC PANEL
ALT: 18 U/L (ref 0–44)
AST: 17 U/L (ref 15–41)
Albumin: 2.5 g/dL — ABNORMAL LOW (ref 3.5–5.0)
Alkaline Phosphatase: 64 U/L (ref 38–126)
Anion gap: 4 — ABNORMAL LOW (ref 5–15)
BUN: 24 mg/dL — ABNORMAL HIGH (ref 8–23)
CO2: 31 mmol/L (ref 22–32)
Calcium: 8 mg/dL — ABNORMAL LOW (ref 8.9–10.3)
Chloride: 101 mmol/L (ref 98–111)
Creatinine, Ser: 0.92 mg/dL (ref 0.61–1.24)
GFR, Estimated: >60 mL/min (ref >60)
Glucose, Bld: 220 mg/dL — ABNORMAL HIGH (ref 70–99)
Potassium: 3.2 mmol/L — ABNORMAL LOW (ref 3.5–5.1)
Sodium: 136 mmol/L (ref 135–145)
Total Bilirubin: 0.6 mg/dL (ref 0.3–1.2)
Total Protein: 4.6 g/dL — ABNORMAL LOW (ref 6.5–8.1)

## 2022-10-19 LAB — GLUCOSE, CAPILLARY
Glucose-Capillary: 181 mg/dL — ABNORMAL HIGH (ref 70–99)
Glucose-Capillary: 198 mg/dL — ABNORMAL HIGH (ref 70–99)
Glucose-Capillary: 236 mg/dL — ABNORMAL HIGH (ref 70–99)
Glucose-Capillary: 218 mg/dL — ABNORMAL HIGH (ref 70–99)

## 2022-10-19 MED ORDER — MIDODRINE HCL 5 MG PO TABS
5.0000 mg | ORAL_TABLET | Freq: Three times a day (TID) | ORAL | Status: DC
  Administered 2022-10-19 – 2022-10-24 (×16): 5 mg via ORAL
  Filled 2022-10-19 (×16): qty 1

## 2022-10-19 NOTE — Progress Notes (Signed)
[facility] PROGRESS NOTE    Progress Note  Kristofer Schaffert  OXB:[id number] DOB: [birth year] DOA: 08/31/2022 PCP: Clovia Cuff, MD     Brief Narrative:   Whitfield Dulay is an 66 y.o. male past medical history significant for CAD/NSTEMI, AAA TAA paroxysmal atrial fibrillation on Eliquis, diabetes mellitus type 2 combined chronic systolic and diastolic heart failure with an EF of 40% recently admitted to [facility] for hematuria and complicated UTI with MDRO's during that time patient was started the ED on Eliquis culture grew ESBL resistant to Cipro unknown time of hematuria on 1111 was sent to the ED by skilled nursing facility urology was consulted as well as ID and patient is on 4 weeks of IV antibiotics. Palliative care was consulted   Assessment/Plan:   Gross hematuria: Urology was consulted they were concerned about obstructive uropathy secondary to gross hematuria. Catheter was placed by urology catheter was placed urine has been cleared with irrigation. Manual bladder irrigation every couple of hours. Awaiting skilled nursing facility placement.  Acute kidney injury ([place]) Likely due to postobstructive uropathy in the setting of gross hematuria. Foley was placed he was started on irrigation, creatinine has returned to baseline.  Complicated UTI: Urine culture grew ESBL ID was consulted. Continue Bactrim for 2 additional days.  Borderline low blood pressure: Currently on midodrine blood pressure is stable.  Lactic acidosis: Resolved with fluid resuscitation.  Diabetic polyneuropathy associated with diabetes mellitus type 2: Metformin was held in the setting of acute kidney injury and lactic acidosis. Currently on sliding scale, blood glucose around 220 200.  Acute on chronic systolic and diastolic heart failure: With an EF of 40 to 45% noted.  Paroxysmal atrial fibrillation: Holding Eliquis due to gross hematuria. Eliquis to be resumed as an  outpatient.  Sacral decubitus ulcer: Wound care was consulted continue dressing per wound care.  Hyperlipidemia: Continue statins.  Swelling of the scrotum: Only minimal to pain Dr. Gloriann Loan examined the patient was unimpressed. Continue to biotics as above.  Sacral decubitus ulcer stage II present on admission RN Pressure Injury Documentation: Pressure Injury 08/03/22 Buttocks Mid Stage 2 -  Partial thickness loss of dermis presenting as a shallow open injury with a red, pink wound bed without slough. area of red blanchable and non blanchable, and areas with skin sloughed off (Active)  08/03/22 1513  Location: Buttocks  Location Orientation: Mid  Staging: Stage 2 -  Partial thickness loss of dermis presenting as a shallow open injury with a red, pink wound bed without slough.  Wound Description (Comments): area of red blanchable and non blanchable, and areas with skin sloughed off  Present on Admission: Yes  Dressing Type Foam - Lift dressing to assess site every shift 10/18/22 0800     DVT prophylaxis: scd Family Communication:none Status is: Inpatient Remains inpatient appropriate because: Awaiting placement    Code Status:     Code Status Orders  (From admission, onward)           Start     Ordered   08/31/22 1946  Do not attempt resuscitation (DNR)  Continuous       Question Answer Comment  In the event of cardiac or respiratory ARREST Do not call a "code blue"   In the event of cardiac or respiratory ARREST Do not perform Intubation, CPR, defibrillation or ACLS   In the event of cardiac or respiratory ARREST Use medication by any route, position, wound care, and other measures to relive pain and suffering.  May use oxygen, suction and manual treatment of airway obstruction as needed for comfort.      08/31/22 1945           Code Status History     Date Active Date Inactive Code Status Order ID Comments User Context   08/03/2022 0859 08/17/2022 0705 DNR  [id number]  Zola Button, MD ED   08/02/2022 1829 08/03/2022 0859 Full Code [id number]  Gerrit Heck, MD ED   06/25/2022 1559 06/30/2022 2102 DNR [id number]  Orma Flaming, MD ED   06/17/2022 1246 06/19/2022 0420 DNR [id number]  Nelva Bush, MD Inpatient   06/17/2022 1054 06/17/2022 1246 Full Code [id number]  End, Harrell Gave, MD Inpatient   06/15/2022 1211 06/17/2022 1054 DNR [id number]  Karmen Bongo, MD ED   06/15/2022 0604 06/15/2022 1211 Full Code [id number]  Rhetta Mura, DO ED   07/28/2021 0128 07/31/2021 0211 Full Code [id number]  Shalhoub, Sherryll Burger, MD ED      Advance Directive Documentation    Flowsheet Row Most Recent Value  Type of Advance Directive Out of facility DNR (pink MOST or yellow form)  Pre-existing out of facility DNR order (yellow form or pink MOST form) Yellow form placed in chart (order not valid for inpatient use)  "MOST" Form in Place? --         IV Access:   Peripheral IV   Procedures and diagnostic studies:   No results found.   Medical Consultants:   None.   Subjective:    [name] no complaints and with an aggressive attitude  Objective:    Vitals:   10/18/22 0514 10/18/22 1306 10/18/22 2104 10/19/22 0442  BP: (!) 131/94 112/68 (!) 134/93 122/79  Pulse: 67 72 63 60  Resp: '20 20 19 13  '$ Temp: 98.1 F (36.7 C) 98.5 F (36.9 C) 98.1 F (36.7 C) 97.8 F (36.6 C)  TempSrc: Oral Oral Oral Oral  SpO2: 97% 97% 99% 94%  Weight:      Height:       SpO2: 94 % FiO2 (%): 21 %   Intake/Output Summary (Last 24 hours) at 10/19/2022 0704 Last data filed at 10/19/2022 0444 Gross per 24 hour  Intake 1120 ml  Output 3450 ml  Net -2330 ml   Filed Weights   10/11/22 0508  Weight: 118.1 kg    Exam: General exam: In no acute distress. Respiratory system: Good air movement and clear to auscultation. Cardiovascular system: S1 & S2 heard, RRR. No JVD. Gastrointestinal system: Abdomen is nondistended, soft and nontender.   Extremities: No pedal edema. Skin: No rashes, lesions or ulcers   Data Reviewed:    Labs: Basic Metabolic Panel: Recent Labs  Lab 10/13/22 0204 10/15/22 0316 10/16/22 0420 10/17/22 0255 10/19/22 0315  NA 136 135 136 137 136  K 3.7 3.5 3.3* 3.4* 3.2*  CL 105 102 101 103 101  CO2 '24 27 29 28 31  '$ GLUCOSE 227* 214* 205* 210* 220*  BUN 27* 24* 25* 24* 24*  CREATININE 0.92 0.82 0.93 0.90 0.92  CALCIUM 8.4* 8.2* 8.0* 8.1* 8.0*   GFR Estimated Creatinine Clearance: 112.4 mL/min (by C-G formula based on SCr of 0.92 mg/dL). Liver Function Tests: Recent Labs  Lab 10/19/22 0315  AST 17  ALT 18  ALKPHOS 64  BILITOT 0.6  PROT 4.6*  ALBUMIN 2.5*   No results for input(s): "LIPASE", "AMYLASE" in the last 168 hours. No results for input(s): "AMMONIA" in the last 168 hours. Coagulation profile No results for input(s): "  INR", "PROTIME" in the last 168 hours. COVID-19 Labs  No results for input(s): "DDIMER", "FERRITIN", "LDH", "CRP" in the last 72 hours.  Lab Results  Component Value Date   SARSCOV2NAA POSITIVE (A) 08/02/2022   [place] NEGATIVE 07/27/2021    CBC: No results for input(s): "WBC", "NEUTROABS", "HGB", "HCT", "MCV", "PLT" in the last 168 hours. Cardiac Enzymes: No results for input(s): "CKTOTAL", "CKMB", "CKMBINDEX", "TROPONINI" in the last 168 hours. BNP (last 3 results) No results for input(s): "PROBNP" in the last 8760 hours. CBG: Recent Labs  Lab 10/17/22 2005 10/18/22 0732 10/18/22 1151 10/18/22 1634 10/18/22 2106  GLUCAP 228* 183* 193* 208* 222*   D-Dimer: No results for input(s): "DDIMER" in the last 72 hours. Hgb A1c: No results for input(s): "HGBA1C" in the last 72 hours. Lipid Profile: No results for input(s): "CHOL", "HDL", "LDLCALC", "TRIG", "CHOLHDL", "LDLDIRECT" in the last 72 hours. Thyroid function studies: No results for input(s): "TSH", "T4TOTAL", "T3FREE", "THYROIDAB" in the last 72 hours.  Invalid input(s):  "FREET3" Anemia work up: No results for input(s): "VITAMINB12", "FOLATE", "FERRITIN", "TIBC", "IRON", "RETICCTPCT" in the last 72 hours. Sepsis Labs: No results for input(s): "PROCALCITON", "WBC", "LATICACIDVEN" in the last 168 hours. Microbiology No results found for this or any previous visit (from the past 240 hour(s)).   Medications:    apixaban  5 mg Oral BID   atorvastatin  80 mg Oral Daily   B-complex with vitamin C  1 tablet Oral Daily   Chlorhexidine Gluconate Cloth  6 each Topical Daily   clopidogrel  75 mg Oral Daily   vitamin B-12  1,000 mcg Oral Daily   feeding supplement  237 mL Oral TID BM   ferrous sulfate  325 mg Oral Q breakfast   finasteride  5 mg Oral Daily   furosemide  20 mg Intravenous Q12H   gabapentin  300 mg Oral q1800   gabapentin  300 mg Oral Daily   hydrocortisone  20 mg Oral Daily   And   hydrocortisone  10 mg Oral Daily   insulin aspart  0-5 Units Subcutaneous QHS   insulin aspart  0-9 Units Subcutaneous TID WC   lidocaine  1 patch Transdermal Daily   melatonin  5 mg Oral q1800   midodrine  10 mg Oral TID with meals   nystatin   Topical BID   pantoprazole  40 mg Oral Daily   senna-docusate  1 tablet Oral BID   sodium chloride flush  10-40 mL Intracatheter Q12H   tamsulosin  0.4 mg Oral Daily   traZODone  50 mg Oral q1800   zolpidem  5 mg Oral q1800   Continuous Infusions:  sodium chloride 250 mL (09/14/22 0248)   sodium chloride 10 mL/hr at 09/24/22 1019      LOS: 48 days   Charlynne Cousins  [facility]  10/19/2022, 7:04 AM

## 2022-10-19 NOTE — Plan of Care (Signed)
  Problem: Health Behavior/Discharge Planning: Goal: Ability to manage health-related needs will improve Outcome: Progressing   Problem: Clinical Measurements: Goal: Ability to maintain clinical measurements within normal limits will improve Outcome: Adequate for Discharge Goal: Will remain free from infection Outcome: Adequate for Discharge Goal: Diagnostic test results will improve Outcome: Adequate for Discharge Goal: Respiratory complications will improve Outcome: Adequate for Discharge Goal: Cardiovascular complication will be avoided Outcome: Adequate for Discharge   Problem: Activity: Goal: Risk for activity intolerance will decrease Outcome: Adequate for Discharge   Problem: Nutrition: Goal: Adequate nutrition will be maintained Outcome: Adequate for Discharge   Problem: Coping: Goal: Level of anxiety will decrease Outcome: Adequate for Discharge   Problem: Elimination: Goal: Will not experience complications related to bowel motility Outcome: Adequate for Discharge Goal: Will not experience complications related to urinary retention Outcome: Adequate for Discharge   Problem: Pain Managment: Goal: General experience of comfort will improve Outcome: Adequate for Discharge   Problem: Safety: Goal: Ability to remain free from injury will improve Outcome: Adequate for Discharge   Problem: Skin Integrity: Goal: Risk for impaired skin integrity will decrease Outcome: Adequate for Discharge   Problem: Urinary Elimination: Goal: Signs and symptoms of infection will decrease Outcome: Adequate for Discharge   Problem: Education: Goal: Knowledge of General Education information will improve Description: Including pain rating scale, medication(s)/side effects and non-pharmacologic comfort measures Outcome: Completed/Met

## 2022-10-20 DIAGNOSIS — D689 Coagulation defect, unspecified: Secondary | ICD-10-CM | POA: Diagnosis not present

## 2022-10-20 DIAGNOSIS — N179 Acute kidney failure, unspecified: Secondary | ICD-10-CM | POA: Diagnosis not present

## 2022-10-20 DIAGNOSIS — R310 Gross hematuria: Secondary | ICD-10-CM | POA: Diagnosis not present

## 2022-10-20 LAB — GLUCOSE, CAPILLARY
Glucose-Capillary: 205 mg/dL — ABNORMAL HIGH (ref 70–99)
Glucose-Capillary: 218 mg/dL — ABNORMAL HIGH (ref 70–99)
Glucose-Capillary: 215 mg/dL — ABNORMAL HIGH (ref 70–99)
Glucose-Capillary: 262 mg/dL — ABNORMAL HIGH (ref 70–99)

## 2022-10-20 NOTE — TOC Progression Note (Signed)
Transition of Care [facility]) - Progression Note    Patient Details  Name: Peter Yang MRN: [id number] Date of Birth: January 17, 1956  [facility] CM/SW Contact  Jorge Amparo, Juliann Pulse, RN Phone Number: 10/20/2022, 2:13 PM  Clinical Narrative:Patient still undecided about ST SNF bed choice-await choice.       Expected Discharge Plan: [place] Barriers to Discharge: Continued Medical Work up  Expected Discharge Plan and Services   Discharge Planning Services: CM Consult   Living arrangements for the past 2 months: [place] Determinants of Health (SDOH) Interventions [place]: No Food Insecurity (09/07/2022)  Housing: Low Risk  (09/07/2022)  Transportation Needs: Unmet Transportation Needs (09/07/2022)  Utilities: At Risk (09/07/2022)  Tobacco Use: Low Risk  (09/02/2022)    Readmission Risk Interventions    09/02/2022   10:36 AM  Readmission Risk Prevention Plan  Transportation Screening Complete  Medication Review ([place]) Complete  PCP or Specialist appointment within 3-5 days of discharge Complete  HRI or [place] Complete  SW Recovery Care/Counseling Consult Complete  [place] Complete

## 2022-10-20 NOTE — Progress Notes (Signed)
[facility] PROGRESS NOTE    Progress Note  Peter Yang  GYK:[id number] DOB: 03/22/1956 DOA: 08/31/2022 PCP: Clovia Cuff, MD     Brief Narrative:   Peter Yang is an 66 y.o. male past medical history significant for CAD/NSTEMI, AAA TAA paroxysmal atrial fibrillation on Eliquis, diabetes mellitus type 2 combined chronic systolic and diastolic heart failure with an EF of 40% recently admitted to [facility] for hematuria and complicated UTI with MDRO's during that time patient was started the ED on Eliquis culture grew ESBL resistant to Cipro unknown time of hematuria on 1111 was sent to the ED by skilled nursing facility urology was consulted as well as ID and patient completed his course of antibiotics in house. Palliative care was consulted   Assessment/Plan:   Gross hematuria: Urology was consulted they were concerned about obstructive uropathy secondary to gross hematuria. Catheter was placed by urology catheter was placed urine has been cleared with irrigation.  Foley catheter was discontinued 10/01/2022. Now with condom foley, he has a history of prostate cancer. Awaiting skilled nursing facility placement. Urology recommended outpatient cystoscopy.  Acute kidney injury ([place]) Likely due to postobstructive uropathy in the setting of gross hematuria. Foley was placed he was started on irrigation, creatinine has returned to baseline.  Complicated UTI: Urine culture grew ESBL ID was consulted. Completed antibiotic treatment in house  Borderline low blood pressure: Currently on midodrine blood pressure is stable.  Lactic acidosis: Resolved with fluid resuscitation.  Diabetic polyneuropathy associated with diabetes mellitus type 2: Metformin was held in the setting of acute kidney injury and lactic acidosis. Currently on sliding scale, blood glucose around 220 200.  Acute on chronic systolic and diastolic heart failure: With an EF of 40 to 45%  noted.  Paroxysmal atrial fibrillation: Eliquis to be resumed as an outpatient.  Sacral decubitus ulcer: Wound care was consulted continue dressing per wound care.  Hyperlipidemia: Continue statins.  Swelling of the scrotum: Only minimal to pain Dr. Gloriann Loan examined the patient was unimpressed. Continue to biotics as above.  Sacral decubitus ulcer stage II present on admission RN Pressure Injury Documentation: Pressure Injury 08/03/22 Buttocks Mid Stage 2 -  Partial thickness loss of dermis presenting as a shallow open injury with a red, pink wound bed without slough. area of red blanchable and non blanchable, and areas with skin sloughed off (Active)  08/03/22 1513  Location: Buttocks  Location Orientation: Mid  Staging: Stage 2 -  Partial thickness loss of dermis presenting as a shallow open injury with a red, pink wound bed without slough.  Wound Description (Comments): area of red blanchable and non blanchable, and areas with skin sloughed off  Present on Admission: Yes  Dressing Type Foam - Lift dressing to assess site every shift 10/19/22 2000     DVT prophylaxis: scd Family Communication:none Status is: Inpatient Remains inpatient appropriate because: Awaiting placement    Code Status:     Code Status Orders  (From admission, onward)           Start     Ordered   08/31/22 1946  Do not attempt resuscitation (DNR)  Continuous       Question Answer Comment  In the event of cardiac or respiratory ARREST Do not call a "code blue"   In the event of cardiac or respiratory ARREST Do not perform Intubation, CPR, defibrillation or ACLS   In the event of cardiac or respiratory ARREST Use medication by any route, position, wound care, and  other measures to relive pain and suffering. May use oxygen, suction and manual treatment of airway obstruction as needed for comfort.      08/31/22 1945           Code Status History     Date Active Date Inactive Code Status  Order ID Comments User Context   08/03/2022 0859 08/17/2022 0705 DNR [id number]  Zola Button, MD ED   08/02/2022 1829 08/03/2022 0859 Full Code [id number]  Gerrit Heck, MD ED   06/25/2022 1559 06/30/2022 2102 DNR [id number]  Orma Flaming, MD ED   06/17/2022 1246 06/19/2022 0420 DNR [id number]  Nelva Bush, MD Inpatient   06/17/2022 1054 06/17/2022 1246 Full Code [id number]  End, Harrell Gave, MD Inpatient   06/15/2022 1211 06/17/2022 1054 DNR [id number]  Karmen Bongo, MD ED   06/15/2022 0604 06/15/2022 1211 Full Code [id number]  Rhetta Mura, DO ED   07/28/2021 0128 07/31/2021 0211 Full Code [id number]  Shalhoub, Sherryll Burger, MD ED      Advance Directive Documentation    Flowsheet Row Most Recent Value  Type of Advance Directive Out of facility DNR (pink MOST or yellow form)  Pre-existing out of facility DNR order (yellow form or pink MOST form) Yellow form placed in chart (order not valid for inpatient use)  "MOST" Form in Place? --         IV Access:   Peripheral IV   Procedures and diagnostic studies:   No results found.   Medical Consultants:   None.   Subjective:    Peter Yang no complaints and with an aggressive attitude  Objective:    Vitals:   10/19/22 0442 10/19/22 1445 10/19/22 2031 10/20/22 0412  BP: 122/79 103/66 (!) 105/57 106/79  Pulse: 60 61 65 62  Resp: '13 16 18 18  '$ Temp: 97.8 F (36.6 C) (!) 97.4 F (36.3 C) 98 F (36.7 C) 98 F (36.7 C)  TempSrc: Oral Oral Oral Oral  SpO2: 94% 98% 98% 96%  Weight:      Height:       SpO2: 96 % FiO2 (%): 21 %   Intake/Output Summary (Last 24 hours) at 10/20/2022 0739 Last data filed at 10/20/2022 0400 Gross per 24 hour  Intake 1600 ml  Output 2850 ml  Net -1250 ml    Filed Weights   10/11/22 0508  Weight: 118.1 kg    Exam: General exam: In no acute distress. Respiratory system: Good air movement and clear to auscultation. Cardiovascular system: S1 & S2 heard, RRR. No  JVD. Gastrointestinal system: Abdomen is nondistended, soft and nontender.  Extremities: No pedal edema. Skin: No rashes, lesions or ulcers   Data Reviewed:    Labs: Basic Metabolic Panel: Recent Labs  Lab 10/15/22 0316 10/16/22 0420 10/17/22 0255 10/19/22 0315  NA 135 136 137 136  K 3.5 3.3* 3.4* 3.2*  CL 102 101 103 101  CO2 '27 29 28 31  '$ GLUCOSE 214* 205* 210* 220*  BUN 24* 25* 24* 24*  CREATININE 0.82 0.93 0.90 0.92  CALCIUM 8.2* 8.0* 8.1* 8.0*    GFR Estimated Creatinine Clearance: 112.4 mL/min (by C-G formula based on SCr of 0.92 mg/dL). Liver Function Tests: Recent Labs  Lab 10/19/22 0315  AST 17  ALT 18  ALKPHOS 64  BILITOT 0.6  PROT 4.6*  ALBUMIN 2.5*    No results for input(s): "LIPASE", "AMYLASE" in the last 168 hours. No results for input(s): "AMMONIA" in the last 168 hours. Coagulation profile No results for input(s): "  INR", "PROTIME" in the last 168 hours. COVID-19 Labs  No results for input(s): "DDIMER", "FERRITIN", "LDH", "CRP" in the last 72 hours.  Lab Results  Component Value Date   SARSCOV2NAA POSITIVE (A) 08/02/2022   [place] NEGATIVE 07/27/2021    CBC: No results for input(s): "WBC", "NEUTROABS", "HGB", "HCT", "MCV", "PLT" in the last 168 hours. Cardiac Enzymes: No results for input(s): "CKTOTAL", "CKMB", "CKMBINDEX", "TROPONINI" in the last 168 hours. BNP (last 3 results) No results for input(s): "PROBNP" in the last 8760 hours. CBG: Recent Labs  Lab 10/18/22 2106 10/19/22 0724 10/19/22 1159 10/19/22 1705 10/19/22 2028  GLUCAP 222* 181* 198* 236* 218*    D-Dimer: No results for input(s): "DDIMER" in the last 72 hours. Hgb A1c: No results for input(s): "HGBA1C" in the last 72 hours. Lipid Profile: No results for input(s): "CHOL", "HDL", "LDLCALC", "TRIG", "CHOLHDL", "LDLDIRECT" in the last 72 hours. Thyroid function studies: No results for input(s): "TSH", "T4TOTAL", "T3FREE", "THYROIDAB" in the last 72  hours.  Invalid input(s): "FREET3" Anemia work up: No results for input(s): "VITAMINB12", "FOLATE", "FERRITIN", "TIBC", "IRON", "RETICCTPCT" in the last 72 hours. Sepsis Labs: No results for input(s): "PROCALCITON", "WBC", "LATICACIDVEN" in the last 168 hours. Microbiology No results found for this or any previous visit (from the past 240 hour(s)).   Medications:    apixaban  5 mg Oral BID   atorvastatin  80 mg Oral Daily   B-complex with vitamin C  1 tablet Oral Daily   Chlorhexidine Gluconate Cloth  6 each Topical Daily   clopidogrel  75 mg Oral Daily   vitamin B-12  1,000 mcg Oral Daily   feeding supplement  237 mL Oral TID BM   ferrous sulfate  325 mg Oral Q breakfast   finasteride  5 mg Oral Daily   furosemide  20 mg Intravenous Q12H   gabapentin  300 mg Oral q1800   gabapentin  300 mg Oral Daily   hydrocortisone  20 mg Oral Daily   And   hydrocortisone  10 mg Oral Daily   insulin aspart  0-5 Units Subcutaneous QHS   insulin aspart  0-9 Units Subcutaneous TID WC   lidocaine  1 patch Transdermal Daily   melatonin  5 mg Oral q1800   midodrine  5 mg Oral TID with meals   nystatin   Topical BID   pantoprazole  40 mg Oral Daily   senna-docusate  1 tablet Oral BID   sodium chloride flush  10-40 mL Intracatheter Q12H   tamsulosin  0.4 mg Oral Daily   traZODone  50 mg Oral q1800   zolpidem  5 mg Oral q1800   Continuous Infusions:  sodium chloride 250 mL (09/14/22 0248)   sodium chloride 10 mL/hr at 09/24/22 1019      LOS: 49 days   Charlynne Cousins  [facility]  10/20/2022, 7:39 AM

## 2022-10-20 NOTE — Plan of Care (Signed)
  Problem: Fluid Volume: Goal: Hemodynamic stability will improve Outcome: Progressing   Problem: Health Behavior/Discharge Planning: Goal: Ability to manage health-related needs will improve Outcome: Progressing   Problem: Clinical Measurements: Goal: Respiratory complications will improve Outcome: Progressing Goal: Cardiovascular complication will be avoided Outcome: Progressing   Problem: Activity: Goal: Risk for activity intolerance will decrease Outcome: Adequate for Discharge   Problem: Clinical Measurements: Goal: Diagnostic test results will improve Outcome: Completed/Met Goal: Signs and symptoms of infection will decrease Outcome: Completed/Met   Problem: Respiratory: Goal: Ability to maintain adequate ventilation will improve Outcome: Completed/Met   Problem: Clinical Measurements: Goal: Ability to maintain clinical measurements within normal limits will improve Outcome: Completed/Met Goal: Will remain free from infection Outcome: Completed/Met Goal: Diagnostic test results will improve Outcome: Completed/Met   Problem: Nutrition: Goal: Adequate nutrition will be maintained Outcome: Completed/Met   Problem: Coping: Goal: Level of anxiety will decrease Outcome: Completed/Met   Problem: Elimination: Goal: Will not experience complications related to bowel motility Outcome: Completed/Met Goal: Will not experience complications related to urinary retention Outcome: Completed/Met   Problem: Pain Managment: Goal: General experience of comfort will improve Outcome: Completed/Met   Problem: Safety: Goal: Ability to remain free from injury will improve Outcome: Completed/Met   Problem: Skin Integrity: Goal: Risk for impaired skin integrity will decrease Outcome: Completed/Met

## 2022-10-21 DIAGNOSIS — I5043 Acute on chronic combined systolic (congestive) and diastolic (congestive) heart failure: Secondary | ICD-10-CM | POA: Diagnosis not present

## 2022-10-21 DIAGNOSIS — E871 Hypo-osmolality and hyponatremia: Secondary | ICD-10-CM | POA: Diagnosis not present

## 2022-10-21 DIAGNOSIS — R310 Gross hematuria: Secondary | ICD-10-CM | POA: Diagnosis present

## 2022-10-21 DIAGNOSIS — I110 Hypertensive heart disease with heart failure: Secondary | ICD-10-CM | POA: Diagnosis present

## 2022-10-21 DIAGNOSIS — E1142 Type 2 diabetes mellitus with diabetic polyneuropathy: Secondary | ICD-10-CM | POA: Diagnosis present

## 2022-10-21 DIAGNOSIS — R188 Other ascites: Secondary | ICD-10-CM | POA: Diagnosis present

## 2022-10-21 DIAGNOSIS — E669 Obesity, unspecified: Secondary | ICD-10-CM | POA: Diagnosis present

## 2022-10-21 DIAGNOSIS — Z8616 Personal history of COVID-19: Secondary | ICD-10-CM | POA: Diagnosis not present

## 2022-10-21 DIAGNOSIS — D689 Coagulation defect, unspecified: Secondary | ICD-10-CM | POA: Diagnosis not present

## 2022-10-21 DIAGNOSIS — D638 Anemia in other chronic diseases classified elsewhere: Secondary | ICD-10-CM | POA: Diagnosis present

## 2022-10-21 DIAGNOSIS — D65 Disseminated intravascular coagulation [defibrination syndrome]: Secondary | ICD-10-CM | POA: Diagnosis not present

## 2022-10-21 DIAGNOSIS — E872 Acidosis, unspecified: Secondary | ICD-10-CM | POA: Diagnosis present

## 2022-10-21 DIAGNOSIS — Z1613 Resistance to carbapenem: Secondary | ICD-10-CM | POA: Diagnosis present

## 2022-10-21 DIAGNOSIS — N179 Acute kidney failure, unspecified: Secondary | ICD-10-CM | POA: Diagnosis not present

## 2022-10-21 DIAGNOSIS — I7121 Aneurysm of the ascending aorta, without rupture: Secondary | ICD-10-CM | POA: Diagnosis present

## 2022-10-21 DIAGNOSIS — R570 Cardiogenic shock: Secondary | ICD-10-CM | POA: Diagnosis present

## 2022-10-21 DIAGNOSIS — F32A Depression, unspecified: Secondary | ICD-10-CM | POA: Diagnosis not present

## 2022-10-21 DIAGNOSIS — N410 Acute prostatitis: Secondary | ICD-10-CM | POA: Diagnosis present

## 2022-10-21 DIAGNOSIS — Z66 Do not resuscitate: Secondary | ICD-10-CM | POA: Diagnosis present

## 2022-10-21 DIAGNOSIS — R571 Hypovolemic shock: Secondary | ICD-10-CM | POA: Diagnosis not present

## 2022-10-21 DIAGNOSIS — D6832 Hemorrhagic disorder due to extrinsic circulating anticoagulants: Secondary | ICD-10-CM | POA: Diagnosis present

## 2022-10-21 DIAGNOSIS — Z1612 Extended spectrum beta lactamase (ESBL) resistance: Secondary | ICD-10-CM | POA: Diagnosis present

## 2022-10-21 DIAGNOSIS — Z515 Encounter for palliative care: Secondary | ICD-10-CM | POA: Diagnosis not present

## 2022-10-21 DIAGNOSIS — Z1623 Resistance to quinolones and fluoroquinolones: Secondary | ICD-10-CM | POA: Diagnosis present

## 2022-10-21 DIAGNOSIS — E43 Unspecified severe protein-calorie malnutrition: Secondary | ICD-10-CM | POA: Diagnosis not present

## 2022-10-21 LAB — GLUCOSE, CAPILLARY
Glucose-Capillary: 184 mg/dL — ABNORMAL HIGH (ref 70–99)
Glucose-Capillary: 207 mg/dL — ABNORMAL HIGH (ref 70–99)
Glucose-Capillary: 187 mg/dL — ABNORMAL HIGH (ref 70–99)
Glucose-Capillary: 236 mg/dL — ABNORMAL HIGH (ref 70–99)

## 2022-10-21 NOTE — TOC Progression Note (Signed)
Transition of Care [facility]) - Progression Note    Patient Details  Name: Peter Yang MRN: [id number] Date of Birth: [birth year]  Transition of Care [facility]) CM/SW Contact  Chalmer Zheng, Juliann Pulse, RN Phone Number: 10/21/2022, 1:08 PM  Clinical Narrative: Patient chose [facility]-unable to reach rep(New Year Day) will try tomorrow since facility must start auth.      Expected Discharge Plan: [place] Barriers to Discharge: Continued Medical Work up  Expected Discharge Plan and Services   Discharge Planning Services: CM Consult   Living arrangements for the past 2 months: [place] Determinants of Health (SDOH) Interventions [place]: No Food Insecurity (09/07/2022)  Housing: Low Risk  (09/07/2022)  Transportation Needs: Unmet Transportation Needs (09/07/2022)  Utilities: At Risk (09/07/2022)  Tobacco Use: Low Risk  (09/02/2022)    Readmission Risk Interventions    09/02/2022   10:36 AM  Readmission Risk Prevention Plan  Transportation Screening Complete  Medication Review ([place]) Complete  PCP or Specialist appointment within 3-5 days of discharge Complete  HRI or [place] Complete  SW Recovery Care/Counseling Consult Complete  [place] Complete

## 2022-10-21 NOTE — Progress Notes (Signed)
[facility] PROGRESS NOTE    Progress Note  Peter Yang  BZJ:[id number] DOB: July 31, 1956 DOA: 08/31/2022 PCP: Clovia Cuff, MD     Brief Narrative:   Peter Yang is an 67 y.o. male past medical history significant for CAD/NSTEMI, AAA TAA paroxysmal atrial fibrillation on Eliquis, diabetes mellitus type 2 combined chronic systolic and diastolic heart failure with an EF of 40% recently admitted to [facility] for hematuria and complicated UTI with MDRO's during that time patient was started the ED on Eliquis culture grew ESBL resistant to Cipro unknown time of hematuria on 1111 was sent to the ED by skilled nursing facility urology was consulted as well as ID and patient completed his course of antibiotics in house. Palliative care was consulted   Assessment/Plan:   Gross hematuria: Urology was consulted they were concerned about obstructive uropathy secondary to gross hematuria. Catheter was placed by urology catheter was placed urine has been cleared with irrigation.  Foley catheter was discontinued 10/01/2022. Now with condom foley, he has a history of prostate cancer. Awaiting skilled nursing facility placement. Urology recommended outpatient cystoscopy.  Acute kidney injury ([place]) Likely due to postobstructive uropathy in the setting of gross hematuria. Foley was placed he was started on irrigation, creatinine has returned to baseline.  Complicated UTI: Urine culture grew ESBL ID was consulted. Completed antibiotic treatment in house  Borderline low blood pressure: Currently on midodrine blood pressure is stable.  Lactic acidosis: Resolved with fluid resuscitation.  Diabetic polyneuropathy associated with diabetes mellitus type 2: Metformin was held in the setting of acute kidney injury and lactic acidosis. Currently on sliding scale, blood glucose around 220 200.  Acute on chronic systolic and diastolic heart failure: With an EF of 40 to 45%  noted.  Paroxysmal atrial fibrillation: Eliquis to be resumed as an outpatient.  Sacral decubitus ulcer: Wound care was consulted continue dressing per wound care.  Hyperlipidemia: Continue statins.  Swelling of the scrotum: Only minimal to pain Dr. Gloriann Loan examined the patient was unimpressed. Continue to biotics as above.  Sacral decubitus ulcer stage II present on admission RN Pressure Injury Documentation: Pressure Injury 08/03/22 Buttocks Mid Stage 2 -  Partial thickness loss of dermis presenting as a shallow open injury with a red, pink wound bed without slough. area of red blanchable and non blanchable, and areas with skin sloughed off (Active)  08/03/22 1513  Location: Buttocks  Location Orientation: Mid  Staging: Stage 2 -  Partial thickness loss of dermis presenting as a shallow open injury with a red, pink wound bed without slough.  Wound Description (Comments): area of red blanchable and non blanchable, and areas with skin sloughed off  Present on Admission: Yes  Dressing Type Foam - Lift dressing to assess site every shift 10/20/22 2020     DVT prophylaxis: scd Family Communication:none Status is: Inpatient Remains inpatient appropriate because: Awaiting placement    Code Status:     Code Status Orders  (From admission, onward)           Start     Ordered   08/31/22 1946  Do not attempt resuscitation (DNR)  Continuous       Question Answer Comment  In the event of cardiac or respiratory ARREST Do not call a "code blue"   In the event of cardiac or respiratory ARREST Do not perform Intubation, CPR, defibrillation or ACLS   In the event of cardiac or respiratory ARREST Use medication by any route, position, wound care, and  other measures to relive pain and suffering. May use oxygen, suction and manual treatment of airway obstruction as needed for comfort.      08/31/22 1945           Code Status History     Date Active Date Inactive Code Status  Order ID Comments User Context   08/03/2022 0859 08/17/2022 0705 DNR [id number]  Zola Button, MD ED   08/02/2022 1829 08/03/2022 0859 Full Code [id number]  Gerrit Heck, MD ED   06/25/2022 1559 06/30/2022 2102 DNR [id number]  Orma Flaming, MD ED   06/17/2022 1246 06/19/2022 0420 DNR [id number]  Nelva Bush, MD Inpatient   06/17/2022 1054 06/17/2022 1246 Full Code [id number]  End, Harrell Gave, MD Inpatient   06/15/2022 1211 06/17/2022 1054 DNR [id number]  Karmen Bongo, MD ED   06/15/2022 0604 06/15/2022 1211 Full Code [id number]  Rhetta Mura, DO ED   07/28/2021 0128 07/31/2021 0211 Full Code [id number]  Shalhoub, Sherryll Burger, MD ED      Advance Directive Documentation    Flowsheet Row Most Recent Value  Type of Advance Directive Out of facility DNR (pink MOST or yellow form)  Pre-existing out of facility DNR order (yellow form or pink MOST form) Yellow form placed in chart (order not valid for inpatient use)  "MOST" Form in Place? --         IV Access:   Peripheral IV   Procedures and diagnostic studies:   No results found.   Medical Consultants:   None.   Subjective:    Peter Yang no complaints and with an aggressive attitude  Objective:    Vitals:   10/20/22 0412 10/20/22 1152 10/20/22 2054 10/21/22 0504  BP: 106/79 105/66 122/79 120/78  Pulse: 62 63 66 63  Resp: '18 17 15 14  '$ Temp: 98 F (36.7 C) 98 F (36.7 C) 98.2 F (36.8 C) 98.1 F (36.7 C)  TempSrc: Oral Oral Oral Oral  SpO2: 96% 99% 99% 99%  Height:       SpO2: 99 % FiO2 (%): 21 %   Intake/Output Summary (Last 24 hours) at 10/21/2022 0824 Last data filed at 10/21/2022 0700 Gross per 24 hour  Intake 1320 ml  Output 3500 ml  Net -2180 ml    Filed Weights    Exam: General exam: In no acute distress. Respiratory system: Good air movement and clear to auscultation. Cardiovascular system: S1 & S2 heard, RRR. No JVD. Gastrointestinal system: Abdomen is nondistended, soft and nontender.   Extremities: No pedal edema. Skin: No rashes, lesions or ulcers   Data Reviewed:    Labs: Basic Metabolic Panel: Recent Labs  Lab 10/15/22 0316 10/16/22 0420 10/17/22 0255 10/19/22 0315  NA 135 136 137 136  K 3.5 3.3* 3.4* 3.2*  CL 102 101 103 101  CO2 '27 29 28 31  '$ GLUCOSE 214* 205* 210* 220*  BUN 24* 25* 24* 24*  CREATININE 0.82 0.93 0.90 0.92  CALCIUM 8.2* 8.0* 8.1* 8.0*    GFR Estimated Creatinine Clearance: 112.4 mL/min (by C-G formula based on SCr of 0.92 mg/dL). Liver Function Tests: Recent Labs  Lab 10/19/22 0315  AST 17  ALT 18  ALKPHOS 64  BILITOT 0.6  PROT 4.6*  ALBUMIN 2.5*    No results for input(s): "LIPASE", "AMYLASE" in the last 168 hours. No results for input(s): "AMMONIA" in the last 168 hours. Coagulation profile No results for input(s): "INR", "PROTIME" in the last 168 hours. COVID-19 Labs  No results for input(s): "DDIMER", "FERRITIN", "  LDH", "CRP" in the last 72 hours.  Lab Results  Component Value Date   SARSCOV2NAA POSITIVE (A) 08/02/2022   [place] NEGATIVE 07/27/2021    CBC: No results for input(s): "WBC", "NEUTROABS", "HGB", "HCT", "MCV", "PLT" in the last 168 hours. Cardiac Enzymes: No results for input(s): "CKTOTAL", "CKMB", "CKMBINDEX", "TROPONINI" in the last 168 hours. BNP (last 3 results) No results for input(s): "PROBNP" in the last 8760 hours. CBG: Recent Labs  Lab 10/20/22 0740 10/20/22 1129 10/20/22 1700 10/20/22 2053 10/21/22 0729  GLUCAP 205* 218* 215* 262* 184*    D-Dimer: No results for input(s): "DDIMER" in the last 72 hours. Hgb A1c: No results for input(s): "HGBA1C" in the last 72 hours. Lipid Profile: No results for input(s): "CHOL", "HDL", "LDLCALC", "TRIG", "CHOLHDL", "LDLDIRECT" in the last 72 hours. Thyroid function studies: No results for input(s): "TSH", "T4TOTAL", "T3FREE", "THYROIDAB" in the last 72 hours.  Invalid input(s): "FREET3" Anemia work up: No results for input(s):  "VITAMINB12", "FOLATE", "FERRITIN", "TIBC", "IRON", "RETICCTPCT" in the last 72 hours. Sepsis Labs: No results for input(s): "PROCALCITON", "WBC", "LATICACIDVEN" in the last 168 hours. Microbiology No results found for this or any previous visit (from the past 240 hour(s)).   Medications:    apixaban  5 mg Oral BID   atorvastatin  80 mg Oral Daily   B-complex with vitamin C  1 tablet Oral Daily   Chlorhexidine Gluconate Cloth  6 each Topical Daily   clopidogrel  75 mg Oral Daily   vitamin B-12  1,000 mcg Oral Daily   feeding supplement  237 mL Oral TID BM   ferrous sulfate  325 mg Oral Q breakfast   finasteride  5 mg Oral Daily   furosemide  20 mg Intravenous Q12H   gabapentin  300 mg Oral q1800   gabapentin  300 mg Oral Daily   hydrocortisone  20 mg Oral Daily   And   hydrocortisone  10 mg Oral Daily   insulin aspart  0-5 Units Subcutaneous QHS   insulin aspart  0-9 Units Subcutaneous TID WC   lidocaine  1 patch Transdermal Daily   melatonin  5 mg Oral q1800   midodrine  5 mg Oral TID with meals   nystatin   Topical BID   pantoprazole  40 mg Oral Daily   senna-docusate  1 tablet Oral BID   sodium chloride flush  10-40 mL Intracatheter Q12H   tamsulosin  0.4 mg Oral Daily   traZODone  50 mg Oral q1800   zolpidem  5 mg Oral q1800   Continuous Infusions:  sodium chloride 250 mL (09/14/22 0248)   sodium chloride 10 mL/hr at 09/24/22 1019      LOS: 50 days   Charlynne Cousins  [facility]  10/21/2022, 8:24 AM

## 2022-10-21 NOTE — Progress Notes (Signed)
Physical Therapy Treatment Patient Details Name: Peter Yang MRN: [id number] DOB: [birth year] Today's Date: 10/21/2022   History of Present Illness Pt is a 67 year old man admitted from SNF with gross hematuria, UTI and sepsis on 11/11. PMH: CAD, NSTEMI, AAA, TIA, PAF, DM, diabetic neuropathy, L BKA, HTN, HLD, CHF with EF of 40-45%, recent admissions with sepsis, UTI, hypoglycemia and recent COVID. Pt has also had several recetn hosptializations since August this year.    PT Comments    General Comments: AxO x 3 currently "frustarted" with "where I'm going from here". Pt does NOT like sitting in recliner "hurts my back". Assisted to sitting EOB Mod Assist for upper body.  Once upright, pt was able to static sit 14 min while attempting to apply RIGHT shoe and LEFT prosthesis.  R foot present with edema.  Shoe barely able to fit with no sock.  "I usually wear a sock".  LEFT prosthesis difficult to apply also due to edema.  Pt is wearing his shrinker.   General transfer comment: pt attempted "partial" sit to stands + 2 side by side assist with prosthetic leg on.  Performed 5 "partial" sit to stand with a 5 second hold count using B UE's to push off bed and partially WB thru B LE.  Pt c/o R knee pain with activity.  "That knee won't hold me"(right) stated pt.  Pt declined to use MAXI SKY to transfer to recliner.  "That recliner is horrible, it hurts my back". Assisted back to supine and Max Assist to scoot to [facility] and position to comfort. Pt will need ST Rehab at SNF to address mobility and functional decline prior to safely returning home.   Recommendations for follow up therapy are one component of a multi-disciplinary discharge planning process, led by the attending physician.  Recommendations may be updated based on patient status, additional functional criteria and insurance authorization.  Follow Up Recommendations  Skilled nursing-short term rehab (<3 hours/day) Can patient physically be  transported by private vehicle: No   Assistance Recommended at Discharge Frequent or constant Supervision/Assistance  Patient can return home with the following Two people to help with walking and/or transfers;Help with stairs or ramp for entrance;A little help with bathing/dressing/bathroom;Assistance with cooking/housework;Assist for transportation   Equipment Recommendations       Recommendations for Other Services       Precautions / Restrictions Precautions Precautions: Fall Precaution Comments: L BKA with prosthesis in room, wear shrinker and monitor BP Restrictions Weight Bearing Restrictions: No     Mobility  Bed Mobility Overal bed mobility: Needs Assistance Bed Mobility: Supine to Sit, Sit to Supine     Supine to sit: Mod assist Sit to supine: Mod assist   General bed mobility comments: assist to raise trunk, assist for BLEs into bed. Pt sat EOB x 914 minutes attempting to apply shoes and Prosthetic leg.    Transfers Overall transfer level: Needs assistance Equipment used: Rolling walker (2 wheels) Transfers: Sit to/from Stand Sit to Stand: +2 physical assistance, Total assist, Max assist           General transfer comment: pt attempted "partial" sit to stands + 2 side by side assist with prosthetic leg on.  Performed 5 "partial" sit to stand with a 5 second hold count using B UE's to push off bed and partially WB thru B LE.  Pt c/o R knee pain with activity.  "That knee won't hold me"(right) stated pt.  Pt declined to use MAXI  SKY to transfer to recliner.  "That recliner is horrible, it hurts my back".    Ambulation/Gait                   Stairs             Wheelchair Mobility    Modified Rankin (Stroke Patients Only)       Balance                                            Cognition Arousal/Alertness: Awake/alert Behavior During Therapy: WFL for tasks assessed/performed Overall Cognitive Status: Within  Functional Limits for tasks assessed Area of Impairment: Orientation                               General Comments: AxO x 3 currently "frustarted" with "where I'm going from here". Pt does NOT like sitting in recliner "hurts my back".        Exercises      General Comments        Pertinent Vitals/Pain Pain Assessment Pain Assessment: Faces Faces Pain Scale: Hurts little more Pain Location: R knee Pain Descriptors / Indicators: Guarding, Tender Pain Intervention(s): Monitored during session, Repositioned    Home Living                          Prior Function            PT Goals (current goals can now be found in the care plan section) Progress towards PT goals: Progressing toward goals    Frequency    Min 2X/week      PT Plan Current plan remains appropriate    Co-evaluation              AM-PAC PT "6 Clicks" Mobility   Outcome Measure  Help needed turning from your back to your side while in a flat bed without using bedrails?: A Lot Help needed moving from lying on your back to sitting on the side of a flat bed without using bedrails?: A Lot Help needed moving to and from a bed to a chair (including a wheelchair)?: A Lot Help needed standing up from a chair using your arms (e.g., wheelchair or bedside chair)?: Total Help needed to walk in hospital room?: Total Help needed climbing 3-5 steps with a railing? : Total 6 Click Score: 9    End of Session Equipment Utilized During Treatment: Gait belt Activity Tolerance: Patient tolerated treatment well Patient left: in bed;with bed alarm set;with call bell/phone within reach Nurse Communication: Mobility status PT Visit Diagnosis: Other abnormalities of gait and mobility (R26.89);Muscle weakness (generalized) (M62.81)     Time: 8466-5993 PT Time Calculation (min) (ACUTE ONLY): 26 min  Charges:  $Therapeutic Activity: 23-37 mins                     Rica Koyanagi  PTA Acute   Rehabilitation Services Office M-F          [phone] Weekend pager [phone]

## 2022-10-22 DIAGNOSIS — R310 Gross hematuria: Secondary | ICD-10-CM | POA: Diagnosis not present

## 2022-10-22 DIAGNOSIS — D689 Coagulation defect, unspecified: Secondary | ICD-10-CM | POA: Diagnosis not present

## 2022-10-22 DIAGNOSIS — N179 Acute kidney failure, unspecified: Secondary | ICD-10-CM | POA: Diagnosis not present

## 2022-10-22 LAB — BASIC METABOLIC PANEL
Anion gap: 10 (ref 5–15)
BUN: 26 mg/dL — ABNORMAL HIGH (ref 8–23)
CO2: 29 mmol/L (ref 22–32)
Calcium: 8.3 mg/dL — ABNORMAL LOW (ref 8.9–10.3)
Chloride: 98 mmol/L (ref 98–111)
Creatinine, Ser: 0.84 mg/dL (ref 0.61–1.24)
GFR, Estimated: >60 mL/min (ref >60)
Glucose, Bld: 177 mg/dL — ABNORMAL HIGH (ref 70–99)
Potassium: 3.1 mmol/L — ABNORMAL LOW (ref 3.5–5.1)
Sodium: 137 mmol/L (ref 135–145)

## 2022-10-22 LAB — GLUCOSE, CAPILLARY
Glucose-Capillary: 253 mg/dL — ABNORMAL HIGH (ref 70–99)
Glucose-Capillary: 236 mg/dL — ABNORMAL HIGH (ref 70–99)
Glucose-Capillary: 193 mg/dL — ABNORMAL HIGH (ref 70–99)
Glucose-Capillary: 232 mg/dL — ABNORMAL HIGH (ref 70–99)

## 2022-10-22 MED ORDER — HYDROCORTISONE 5 MG PO TABS
5.0000 mg | ORAL_TABLET | Freq: Every day | ORAL | Status: DC
  Administered 2022-10-22 – 2022-10-24 (×3): 5 mg via ORAL
  Filled 2022-10-22 (×3): qty 1

## 2022-10-22 MED ORDER — POTASSIUM CHLORIDE CRYS ER 20 MEQ PO TBCR
40.0000 meq | EXTENDED_RELEASE_TABLET | Freq: Two times a day (BID) | ORAL | Status: DC
  Administered 2022-10-22: 40 meq via ORAL
  Filled 2022-10-22 (×2): qty 2

## 2022-10-22 MED ORDER — HYDROCORTISONE 10 MG PO TABS
10.0000 mg | ORAL_TABLET | Freq: Every day | ORAL | Status: DC
  Administered 2022-10-23 – 2022-10-24 (×2): 10 mg via ORAL
  Filled 2022-10-22 (×3): qty 1

## 2022-10-22 MED ORDER — FUROSEMIDE 20 MG PO TABS
20.0000 mg | ORAL_TABLET | Freq: Every day | ORAL | Status: DC
  Administered 2022-10-22 – 2022-10-24 (×3): 20 mg via ORAL
  Filled 2022-10-22 (×3): qty 1

## 2022-10-22 NOTE — TOC Progression Note (Signed)
Transition of Care [facility]) - Progression Note    Patient Details  Name: Peter Yang MRN: [id number] Date of Birth: 1956/10/15  Transition of Care [facility]) CM/SW Contact  Mega Kinkade, Juliann Pulse, RN Phone Number: 10/22/2022, 10:53 AM  Clinical Narrative: Patient chose [facility] Marita [place] initiating auth for [employer].      Expected Discharge Plan: [place] Barriers to Discharge: Insurance Authorization ([place] will initiate auth.)  Expected Discharge Plan and Services   Discharge Planning Services: CM Consult   Living arrangements for the past 2 months: Single Family Home                                       Social Determinants of Health (SDOH) Interventions SDOH Screenings   Food Insecurity: No Food Insecurity (09/07/2022)  Housing: Low Risk  (09/07/2022)  Transportation Needs: Unmet Transportation Needs (09/07/2022)  Utilities: At Risk (09/07/2022)  Tobacco Use: Low Risk  (09/02/2022)    Readmission Risk Interventions    09/02/2022   10:36 AM  Readmission Risk Prevention Plan  Transportation Screening Complete  Medication Review ([place]) Complete  PCP or Specialist appointment within 3-5 days of discharge Complete  HRI or [place] Complete  SW Recovery Care/Counseling Consult Complete  [place] Complete

## 2022-10-22 NOTE — Progress Notes (Signed)
[facility] PROGRESS NOTE    Progress Note  Peter Yang  ZOX:[id number] DOB: 07-04-1956 DOA: 08/31/2022 PCP: Clovia Cuff, MD     Brief Narrative:   Peter Yang is an 67 y.o. male past medical history significant for CAD/NSTEMI, AAA TAA paroxysmal atrial fibrillation on Eliquis, diabetes mellitus type 2 combined chronic systolic and diastolic heart failure with an EF of 40% recently admitted to [facility] for hematuria and complicated UTI with MDRO's during that time patient was started the ED on Eliquis culture grew ESBL resistant to Cipro unknown time of hematuria on 1111 was sent to the ED by skilled nursing facility urology was consulted as well as ID and patient completed his course of antibiotics in house. He is currently awaiting skilled nursing facility placement.  Significant events: 11/11 Admit by TRH, BC>, UC>, PCCM consult, Mero and amoxicillin> 11/12 Started on peripheral Levophed for persistent hypotension, albumin 11/13 Troponin rising from 263 to 771, DVT/PE ruled out 11/14 dobutamine started for low coox, lower EF on echo 11/15 trial of restarting heparin 11/17 off dobutamine 11/19 coox 93, remains on pressors 11/20 Coox 67, CVP 8, on 3 norepi 11/21 Coox 75.1, 4 norepi  11/23 Norepi weaned to 42mg, hypotension into 80's overnight > increased levo. Robust UOP despite no lasix in 72 hours.   11/24 Levophed weaned off briefly yesterday and restarted overnight 11/26 Patient responsive to IVF resuscitation. Initial urine studies suggest hypovolemic hyponatremia 11/27 Ongoing mild hypotension, goal MAP 60. Remains on NE 29m. Midodrine increased, droxidopa added. Mild persistent hematuria with clots.   Assessment/Plan:   Shock mix picture septic from UTI/cardiogenic shock with new depressed EF/hypovolemic hyponatremia: Has completed course of antibiotics in house history echo showed an EF of 30%. Now has stabilized continue Lasix 20 mg  daily.  Gross hematuria: Urology was consulted they were concerned about obstructive uropathy secondary to gross hematuria. Catheter was placed by urology catheter was placed urine has been cleared with irrigation.  Foley catheter was discontinued 10/01/2022. Now with condom foley, he has a history of prostate cancer. Awaiting skilled nursing facility placement. Urology recommended outpatient cystoscopy.  Acute kidney injury (HCCastroLikely due to postobstructive uropathy in the setting of gross hematuria. Foley was placed he was started on irrigation, creatinine has returned to baseline.  Complicated UTI: Urine culture grew ESBL ID was consulted. Completed antibiotic treatment in house  Borderline low blood pressure: Currently on midodrine blood pressure is stable. He was started on steroids stress dose continue to titrate down slowly.  Lactic acidosis: Resolved with fluid resuscitation.  Diabetic polyneuropathy associated with diabetes mellitus type 2: Metformin was held in the setting of acute kidney injury and lactic acidosis. Currently on sliding scale, blood glucose around 220 200.  Acute on chronic systolic and diastolic heart failure: With an EF of 40 to 45% noted.  Paroxysmal atrial fibrillation: Eliquis to be resumed as an outpatient.  Sacral decubitus ulcer: Wound care was consulted continue dressing per wound care.  Hyperlipidemia: Continue statins.  Swelling of the scrotum: Only minimal to pain Dr. BeGloriann Loanxamined the patient was unimpressed. Continue to biotics as above.  Sacral decubitus ulcer stage II present on admission RN Pressure Injury Documentation: Pressure Injury 08/03/22 Buttocks Mid Stage 2 -  Partial thickness loss of dermis presenting as a shallow open injury with a red, pink wound bed without slough. area of red blanchable and non blanchable, and areas with skin sloughed off (Active)  08/03/22 1513  Location: Buttocks  Location Orientation:  Mid   Staging: Stage 2 -  Partial thickness loss of dermis presenting as a shallow open injury with a red, pink wound bed without slough.  Wound Description (Comments): area of red blanchable and non blanchable, and areas with skin sloughed off  Present on Admission: Yes  Dressing Type Foam - Lift dressing to assess site every shift 10/21/22 1950     DVT prophylaxis: scd Family Communication:none Status is: Inpatient Remains inpatient appropriate because: Awaiting placement    Code Status:     Code Status Orders  (From admission, onward)           Start     Ordered   08/31/22 1946  Do not attempt resuscitation (DNR)  Continuous       Question Answer Comment  In the event of cardiac or respiratory ARREST Do not call a "code blue"   In the event of cardiac or respiratory ARREST Do not perform Intubation, CPR, defibrillation or ACLS   In the event of cardiac or respiratory ARREST Use medication by any route, position, wound care, and other measures to relive pain and suffering. May use oxygen, suction and manual treatment of airway obstruction as needed for comfort.      08/31/22 1945           Code Status History     Date Active Date Inactive Code Status Order ID Comments User Context   08/03/2022 0859 08/17/2022 0705 DNR [id number]  Zola Button, MD ED   08/02/2022 1829 08/03/2022 0859 Full Code [id number]  Gerrit Heck, MD ED   06/25/2022 1559 06/30/2022 2102 DNR [id number]  Orma Flaming, MD ED   06/17/2022 1246 06/19/2022 0420 DNR [id number]  Nelva Bush, MD Inpatient   06/17/2022 1054 06/17/2022 1246 Full Code [id number]  End, Harrell Gave, MD Inpatient   06/15/2022 1211 06/17/2022 1054 DNR [id number]  Karmen Bongo, MD ED   06/15/2022 0604 06/15/2022 1211 Full Code [id number]  Rhetta Mura, DO ED   07/28/2021 0128 07/31/2021 0211 Full Code [id number]  Shalhoub, Sherryll Burger, MD ED      Advance Directive Documentation    Flowsheet Row Most Recent Value  Type of Advance  Directive Out of facility DNR (pink MOST or yellow form)  Pre-existing out of facility DNR order (yellow form or pink MOST form) Yellow form placed in chart (order not valid for inpatient use)  "MOST" Form in Place? --         IV Access:   Peripheral IV   Procedures and diagnostic studies:   No results found.   Medical Consultants:   None.   Subjective:    Halsey Hammen not a very good attitude this morning.  Objective:    Vitals:   10/21/22 0504 10/21/22 1120 10/21/22 2043 10/22/22 0554  BP: 120/78 111/81 108/68 116/70  Pulse: 63 65 62 (!) 59  Resp: 14 (!) '22 18 19  '$ Temp: 98.1 F (36.7 C) 97.7 F (36.5 C) 97.8 F (36.6 C) 97.9 F (36.6 C)  TempSrc: Oral Oral Oral Oral  SpO2: 99% 97% 100% 98%  Height:       SpO2: 98 % FiO2 (%): 21 %   Intake/Output Summary (Last 24 hours) at 10/22/2022 0805 Last data filed at 10/22/2022 0500 Gross per 24 hour  Intake 360 ml  Output 2400 ml  Net -2040 ml    Filed Weights    Exam: General exam: In no acute distress. Respiratory system: Good air movement and clear to auscultation. Cardiovascular system: S1 &  S2 heard, RRR. No JVD. Gastrointestinal system: Abdomen is nondistended, soft and nontender.  Extremities: No pedal edema. Skin: No rashes, lesions or ulcers Psychiatry: He is an innappropriate in a bad mood all the time every day. Data Reviewed:    Labs: Basic Metabolic Panel: Recent Labs  Lab 10/16/22 0420 10/17/22 0255 10/19/22 0315  NA 136 137 136  K 3.3* 3.4* 3.2*  CL 101 103 101  CO2 '29 28 31  '$ GLUCOSE 205* 210* 220*  BUN 25* 24* 24*  CREATININE 0.93 0.90 0.92  CALCIUM 8.0* 8.1* 8.0*    GFR Estimated Creatinine Clearance: 112.4 mL/min (by C-G formula based on SCr of 0.92 mg/dL). Liver Function Tests: Recent Labs  Lab 10/19/22 0315  AST 17  ALT 18  ALKPHOS 64  BILITOT 0.6  PROT 4.6*  ALBUMIN 2.5*    No results for input(s): "LIPASE", "AMYLASE" in the last 168 hours. No results  for input(s): "AMMONIA" in the last 168 hours. Coagulation profile No results for input(s): "INR", "PROTIME" in the last 168 hours. COVID-19 Labs  No results for input(s): "DDIMER", "FERRITIN", "LDH", "CRP" in the last 72 hours.  Lab Results  Component Value Date   SARSCOV2NAA POSITIVE (A) 08/02/2022   [place] NEGATIVE 07/27/2021    CBC: No results for input(s): "WBC", "NEUTROABS", "HGB", "HCT", "MCV", "PLT" in the last 168 hours. Cardiac Enzymes: No results for input(s): "CKTOTAL", "CKMB", "CKMBINDEX", "TROPONINI" in the last 168 hours. BNP (last 3 results) No results for input(s): "PROBNP" in the last 8760 hours. CBG: Recent Labs  Lab 10/21/22 0729 10/21/22 1118 10/21/22 1647 10/21/22 2040 10/22/22 0733  GLUCAP 184* 207* 187* 236* 253*    D-Dimer: No results for input(s): "DDIMER" in the last 72 hours. Hgb A1c: No results for input(s): "HGBA1C" in the last 72 hours. Lipid Profile: No results for input(s): "CHOL", "HDL", "LDLCALC", "TRIG", "CHOLHDL", "LDLDIRECT" in the last 72 hours. Thyroid function studies: No results for input(s): "TSH", "T4TOTAL", "T3FREE", "THYROIDAB" in the last 72 hours.  Invalid input(s): "FREET3" Anemia work up: No results for input(s): "VITAMINB12", "FOLATE", "FERRITIN", "TIBC", "IRON", "RETICCTPCT" in the last 72 hours. Sepsis Labs: No results for input(s): "PROCALCITON", "WBC", "LATICACIDVEN" in the last 168 hours. Microbiology No results found for this or any previous visit (from the past 240 hour(s)).   Medications:    apixaban  5 mg Oral BID   atorvastatin  80 mg Oral Daily   B-complex with vitamin C  1 tablet Oral Daily   Chlorhexidine Gluconate Cloth  6 each Topical Daily   clopidogrel  75 mg Oral Daily   vitamin B-12  1,000 mcg Oral Daily   feeding supplement  237 mL Oral TID BM   ferrous sulfate  325 mg Oral Q breakfast   finasteride  5 mg Oral Daily   furosemide  20 mg Intravenous Q12H   gabapentin  300 mg Oral  q1800   gabapentin  300 mg Oral Daily   hydrocortisone  20 mg Oral Daily   And   hydrocortisone  10 mg Oral Daily   insulin aspart  0-5 Units Subcutaneous QHS   insulin aspart  0-9 Units Subcutaneous TID WC   lidocaine  1 patch Transdermal Daily   melatonin  5 mg Oral q1800   midodrine  5 mg Oral TID with meals   nystatin   Topical BID   pantoprazole  40 mg Oral Daily   senna-docusate  1 tablet Oral BID   sodium chloride flush  10-40 mL  Intracatheter Q12H   tamsulosin  0.4 mg Oral Daily   traZODone  50 mg Oral q1800   zolpidem  5 mg Oral q1800   Continuous Infusions:  sodium chloride 250 mL (09/14/22 0248)   sodium chloride 10 mL/hr at 09/24/22 1019      LOS: 51 days   Charlynne Cousins  [facility]  10/22/2022, 8:05 AM

## 2022-10-23 DIAGNOSIS — R310 Gross hematuria: Secondary | ICD-10-CM | POA: Diagnosis not present

## 2022-10-23 LAB — BASIC METABOLIC PANEL
Anion gap: 6 (ref 5–15)
BUN: 26 mg/dL — ABNORMAL HIGH (ref 8–23)
CO2: 30 mmol/L (ref 22–32)
Calcium: 8.3 mg/dL — ABNORMAL LOW (ref 8.9–10.3)
Chloride: 100 mmol/L (ref 98–111)
Creatinine, Ser: 0.88 mg/dL (ref 0.61–1.24)
GFR, Estimated: >60 mL/min (ref >60)
Glucose, Bld: 230 mg/dL — ABNORMAL HIGH (ref 70–99)
Potassium: 3.4 mmol/L — ABNORMAL LOW (ref 3.5–5.1)
Sodium: 136 mmol/L (ref 135–145)

## 2022-10-23 LAB — GLUCOSE, CAPILLARY
Glucose-Capillary: 196 mg/dL — ABNORMAL HIGH (ref 70–99)
Glucose-Capillary: 186 mg/dL — ABNORMAL HIGH (ref 70–99)
Glucose-Capillary: 170 mg/dL — ABNORMAL HIGH (ref 70–99)
Glucose-Capillary: 175 mg/dL — ABNORMAL HIGH (ref 70–99)

## 2022-10-23 MED ORDER — POLYVINYL ALCOHOL 1.4 % OP SOLN
1.0000 [drp] | Freq: Every day | OPHTHALMIC | Status: DC | PRN
  Filled 2022-10-23: qty 15

## 2022-10-23 MED ORDER — POTASSIUM CHLORIDE CRYS ER 20 MEQ PO TBCR
40.0000 meq | EXTENDED_RELEASE_TABLET | Freq: Every day | ORAL | Status: DC
  Administered 2022-10-23 – 2022-10-24 (×2): 40 meq via ORAL
  Filled 2022-10-23 (×2): qty 2

## 2022-10-23 MED ORDER — SACCHAROMYCES BOULARDII 250 MG PO CAPS
250.0000 mg | ORAL_CAPSULE | Freq: Two times a day (BID) | ORAL | Status: DC
  Administered 2022-10-23 – 2022-10-24 (×3): 250 mg via ORAL
  Filled 2022-10-23 (×3): qty 1

## 2022-10-23 NOTE — Inpatient Diabetes Management (Signed)
Inpatient Diabetes Program Recommendations  AACE/ADA: New Consensus Statement on Inpatient Glycemic Control (2015)  Target Ranges:  Prepandial:   less than 140 mg/dL      Peak postprandial:   less than 180 mg/dL (1-2 hours)      Critically ill patients:  140 - 180 mg/dL   Lab Results  Component Value Date   GLUCAP 196 (H) 10/23/2022   HGBA1C 6.6 (H) 08/03/2022    Review of Glycemic Control  Latest Reference Range & Units 10/22/22 07:33 10/22/22 11:59 10/22/22 17:03 10/22/22 20:40 10/23/22 07:32  Glucose-Capillary 70 - 99 mg/dL 253 (H) 236 (H) 193 (H) 232 (H) 196 (H)  (H): Data is abnormally high  Diabetes history: DM2 Outpatient Diabetes medications:  Jardiance 10 QD Metformin 1000 mg BID Current orders for Inpatient glycemic control:  Novolog 0-9 units TID and 0-5 units QHS Cortef 10 mg QAM, 5 mg QHS (decreased)  Inpatient Diabetes Program Recommendations:    Might consider a small dose of basal insulin:  Semglee 12 units QD (0.1 units x 118.1 kg)  Will continue to follow while inpatient.  Thank you, Reche Dixon, MSN, [place] Diabetes Coordinator Inpatient Diabetes Program [phone] (team pager from 8a-5p)

## 2022-10-23 NOTE — Progress Notes (Signed)
PROGRESS NOTE [place]  WJX:[id number] DOB: 09-24-1956 DOA: 08/31/2022 PCP: Clovia Cuff, MD   Brief Narrative/Hospital Course: 67 year old man who presented to the [facility] ED 11/11 with a chief complaint of hematuria. PMHx significant for HTN, HLD, HFrEF/ICM EF 40-45%, CAD/NSTEMI, TIA, PAF (on Eliquis), T2DM, BPH (hx prostate CA) with questionable length of time of hematuria which may have been going on since 10/13 per patient.  Sent to the emergency department by SNF staff on 11/11 and admitted.  Cultures obtained placed on antibiotics, patient had persistent hypotension pulmonary consulted was started on peripheral Levophed 11/12.  During his course patient has been seen by urology.  ID was also consulted patient was on IV antibiotics for 4 weeks.  Palliative care was consulted for symptom management and goals of care.  He was found to have shock with mixed picture septic from UTI cardiogenic shock and new depressed EF and hypovolemic shock. At this time he remains hemodynamically stable.  Completed antibiotics course.  PT OT following and awaiting for skilled nursing 50 placement    Subjective: Seen and examined Overnight afebrile Bp stable Labs w/ k 3.4 this am, stable renal function Not much interested to interact  Assessment and Plan: Principal Problem:   Gross hematuria Active Problems:   Acute kidney injury ([place])   Coronary artery disease due to lipid rich plaque   UTI (urinary tract infection)   Borderline low blood pressure determined by examination   Lactic acidosis   Diabetic polyneuropathy associated with type 2 diabetes mellitus (HCC)   PAF (paroxysmal atrial fibrillation) (HCC)   Acute on chronic HFrEF (heart failure with reduced ejection fraction) (HCC)   Pressure ulcer of sacral region, stage 2 (HCC)   Dyslipidemia   Cellulitis of scrotum   Acute prostatitis   Septic shock (HCC)   Hyponatremia   Cardiogenic shock (HCC)   Physical deconditioning   Palliative  care by specialist   Goals of care, counseling/discussion   AKI (acute kidney injury) ([place])   Mood altered   Medication management   Need for emotional support   Pain   High risk medication use   Counseling and coordination of care   Acute on chronic systolic congestive heart failure (HCC)   HFrEF (heart failure with reduced ejection fraction) (HCC)   Orthostatic hypotension   Hydronephrosis   Thrombocytopenia (HCC)   Shock multifactorial due to sepsis with UTI/cardiogenic/hypovolemic: Psych physiology resolved.  Echo shows EF 30%.  Continue Lasix 20 mg daily monitor volume status. Net IO Since Admission: -72,585.68 mL [10/23/22 1131]   Intake/Output Summary (Last 24 hours) at 10/23/2022 1131 Last data filed at 10/23/2022 0900 Gross per 24 hour  Intake 1220 ml  Output 3350 ml  Net -2130 ml   Gross hematuria: Seen by urology concerned about obstructive uropathy secondary to gross hematuria. Catheter placed by uro and discontinued 10/01/2022. Cont external catheter.  Has history of prostate cancer with outpatient cystoscopy follow-up with urology.  Continue PT OT  AKI due to postobstructive uropathy resolved Recent Labs  Lab 10/17/22 0255 10/19/22 0315 10/22/22 1300 10/23/22 0815  BUN 24* 24* 26* 26*  CREATININE 0.90 0.92 2.13 0.86   Complicated UTI with ESBL completed antibiotics per ID Borderline low blood pressure: Stable.  Was started on stress dose steroid with plan to wean slowly. On midodrine   Lactic acidosis: Resolved   Diabetic polyneuropathy associated with diabetes mellitus type 2: Fairly stable, continue sliding scale insulin.  Metformin on hold Recent Labs  Lab 10/22/22 1159  10/22/22 1703 10/22/22 2040 10/23/22 0732 10/23/22 1123  GLUCAP 236* 193* 232* 196* 186*    Acute on chronic systolic and diastolic CHF: Currently negative balance.  Monitor intake output PAF: cont Eliquis as OP. Sacral decubitus ulcer: Continue wound care and dressing  changes Hyperlipidemia continue statin Swelling of the scrotum :Only minimal to pain Dr. Gloriann Loan examined the patient was unimpressed. Sacral decubitus ulcer stage II present on admission RN Pressure Injury Documentation:     Pressure Injury 08/03/22 Buttocks Mid Stage 2 -  Partial thickness loss of dermis presenting as a shallow open injury with a red, pink wound bed without slough. area of red blanchable and non blanchable, and areas with skin sloughed off (Active)  08/03/22 1513  Location: Buttocks  Location Orientation: Mid  Staging: Stage 2 -  Partial thickness loss of dermis presenting as a shallow open injury with a red, pink wound bed without slough.  Wound Description (Comments): area of red blanchable and non blanchable, and areas with skin sloughed off  Present on Admission: Yes  Dressing Type Foam - Lift dressing to assess site every shift     Class I Obesity:Patient's Body mass index is 31.69 kg/m. : Will benefit with PCP follow-up, weight loss  healthy lifestyle and outpatient sleep evaluation.   DVT prophylaxis: Place and maintain sequential compression device Start: 10/02/22 1444 SCDs Start: 08/31/22 1944 Code Status:   Code Status: DNR Family Communication: plan of care discussed with patient at bedside. Patient status is: Inpatient because of pending placement Level of care: Telemetry   Dispo: The patient is from: home            Anticipated disposition: SNF  Objective: Vitals last 24 hrs: Vitals:   10/22/22 1403 10/22/22 2042 10/22/22 2200 10/23/22 0634  BP: 117/76 92/62 108/72 100/75  Pulse: 60 62 62 68  Resp: '18 18 19 20  '$ Temp: 98.1 F (36.7 C) 98.7 F (37.1 C) 98.6 F (37 C) 98.1 F (36.7 C)  TempSrc: Oral  Oral Oral  SpO2: 98% 97%  99%  Height:       Weight change:   Physical Examination: General exam: alert awake, older than stated age HEENT:Oral mucosa moist, Ear/Nose WNL grossly Respiratory system: bilaterally clear BS, no use of accessory  muscle Cardiovascular system: S1 & S2 +, No JVD. Gastrointestinal system: Abdomen soft,NT,ND, BS+ Nervous System:Alert, awake, moving extremities. Extremities: LE edema neg, lt BKA, Skin: No rashes,no icterus. MSK: Normal muscle bulk,tone, power  Medications reviewed:  Scheduled Meds:  apixaban  5 mg Oral BID   atorvastatin  80 mg Oral Daily   B-complex with vitamin C  1 tablet Oral Daily   Chlorhexidine Gluconate Cloth  6 each Topical Daily   clopidogrel  75 mg Oral Daily   vitamin B-12  1,000 mcg Oral Daily   feeding supplement  237 mL Oral TID BM   ferrous sulfate  325 mg Oral Q breakfast   finasteride  5 mg Oral Daily   furosemide  20 mg Oral Daily   gabapentin  300 mg Oral q1800   gabapentin  300 mg Oral Daily   hydrocortisone  10 mg Oral Daily   hydrocortisone  5 mg Oral Daily   insulin aspart  0-5 Units Subcutaneous QHS   insulin aspart  0-9 Units Subcutaneous TID WC   lidocaine  1 patch Transdermal Daily   melatonin  5 mg Oral q1800   midodrine  5 mg Oral TID with meals   nystatin  Topical BID   pantoprazole  40 mg Oral Daily   potassium chloride  40 mEq Oral Daily   senna-docusate  1 tablet Oral BID   sodium chloride flush  10-40 mL Intracatheter Q12H   tamsulosin  0.4 mg Oral Daily   traZODone  50 mg Oral q1800   zolpidem  5 mg Oral q1800   Continuous Infusions:  sodium chloride 250 mL (09/14/22 0248)   sodium chloride 10 mL/hr at 09/24/22 1019   Diet Order             Diet Carb Modified Fluid consistency: Thin; Room service appropriate? Yes  Diet effective now                  Intake/Output Summary (Last 24 hours) at 10/23/2022 1049 Last data filed at 10/23/2022 0900 Gross per 24 hour  Intake 1220 ml  Output 3350 ml  Net -2130 ml   Net IO Since Admission: -72,585.68 mL [10/23/22 1049]  Wt Readings from Last 3 Encounters:  08/16/22 90.9 kg  06/30/22 101.9 kg  06/18/22 95.3 kg     Unresulted Labs (From admission, onward)    None     Data  Reviewed: I have personally reviewed following labs and imaging studies CBC: No results for input(s): "WBC", "NEUTROABS", "HGB", "HCT", "MCV", "PLT" in the last 168 hours. Basic Metabolic Panel: Recent Labs  Lab 10/17/22 0255 10/19/22 0315 10/22/22 1300 10/23/22 0815  NA 137 136 137 136  K 3.4* 3.2* 3.1* 3.4*  CL 103 101 98 100  CO2 '28 31 29 30  '$ GLUCOSE 210* 220* 177* 230*  BUN 24* 24* 26* 26*  CREATININE 0.90 0.92 0.84 0.88  CALCIUM 8.1* 8.0* 8.3* 8.3*   GFR: Estimated Creatinine Clearance: 117.5 mL/min (by C-G formula based on SCr of 0.88 mg/dL). Liver Function Tests: Recent Labs  Lab 10/19/22 0315  AST 17  ALT 18  ALKPHOS 64  BILITOT 0.6  PROT 4.6*  ALBUMIN 2.5*   CBG: Recent Labs  Lab 10/22/22 0733 10/22/22 1159 10/22/22 1703 10/22/22 2040 10/23/22 0732  GLUCAP 253* 236* 193* 232* 196*  No results found for this or any previous visit (from the past 240 hour(s)).  Antimicrobials: Anti-infectives (From admission, onward)    Start     Dose/Rate Route Frequency Ordered Stop   09/05/22 1600  ertapenem (INVANZ) 1,000 mg in sodium chloride 0.9 % 100 mL IVPB        1 g 200 mL/hr over 30 Minutes Intravenous Every 24 hours 09/05/22 1357 09/30/22 0601   09/03/22 0800  meropenem (MERREM) 1 g in sodium chloride 0.9 % 100 mL IVPB  Status:  Discontinued        1 g 200 mL/hr over 30 Minutes Intravenous Every 8 hours 09/03/22 0201 09/05/22 1357   09/01/22 1100  meropenem (MERREM) 1 g in sodium chloride 0.9 % 100 mL IVPB  Status:  Discontinued        1 g 200 mL/hr over 30 Minutes Intravenous Every 12 hours 09/01/22 0416 09/03/22 0201   09/01/22 1100  fluconazole (DIFLUCAN) IVPB 200 mg  Status:  Discontinued        200 mg 100 mL/hr over 60 Minutes Intravenous Every 24 hours 09/01/22 1020 09/04/22 1309   09/01/22 0600  meropenem (MERREM) 1 g in sodium chloride 0.9 % 100 mL IVPB  Status:  Discontinued        1 g 200 mL/hr over 30 Minutes Intravenous Every 8 hours 08/31/22  2136  09/01/22 0416   08/31/22 2200  sulfamethoxazole-trimethoprim (BACTRIM DS) 800-160 MG per tablet 1 tablet  Status:  Discontinued        1 tablet Oral 2 times daily 08/31/22 2011 08/31/22 2129   08/31/22 2200  amoxicillin (AMOXIL) capsule 500 mg  Status:  Discontinued        500 mg Oral Every 8 hours 08/31/22 2131 09/03/22 1417   08/31/22 2145  meropenem (MERREM) 1 g in sodium chloride 0.9 % 100 mL IVPB        1 g 200 mL/hr over 30 Minutes Intravenous  Once 08/31/22 2136 09/01/22 0820   08/31/22 1815  cefTRIAXone (ROCEPHIN) 1 g in sodium chloride 0.9 % 100 mL IVPB        1 g 200 mL/hr over 30 Minutes Intravenous  Once 08/31/22 1814 08/31/22 1911      Culture/Microbiology    Component Value Date/Time   SDES  08/31/2022 1830    BLOOD SITE NOT SPECIFIED Performed at [facility], [place] [address], [place], [place] [address]    SPECREQUEST  08/31/2022 1830    BOTTLES DRAWN AEROBIC AND ANAEROBIC Blood Culture results may not be optimal due to an inadequate volume of blood received in culture bottles Performed at [facility], [place] [address] [place], [place] [address]    CULT  08/31/2022 1830    NO GROWTH 5 DAYS Performed at [facility] [address], [place],  [address]    REPTSTATUS 09/06/2022 FINAL 08/31/2022 1830  Radiology Studies: No results found.   LOS: 9 days   Antonieta Pert, MD [facility]  10/23/2022, 10:49 AM

## 2022-10-24 DIAGNOSIS — R310 Gross hematuria: Secondary | ICD-10-CM | POA: Diagnosis not present

## 2022-10-24 LAB — BASIC METABOLIC PANEL
Anion gap: 5 (ref 5–15)
BUN: 26 mg/dL — ABNORMAL HIGH (ref 8–23)
CO2: 28 mmol/L (ref 22–32)
Calcium: 8.4 mg/dL — ABNORMAL LOW (ref 8.9–10.3)
Chloride: 102 mmol/L (ref 98–111)
Creatinine, Ser: 0.92 mg/dL (ref 0.61–1.24)
GFR, Estimated: >60 mL/min (ref >60)
Glucose, Bld: 151 mg/dL — ABNORMAL HIGH (ref 70–99)
Potassium: 3.8 mmol/L (ref 3.5–5.1)
Sodium: 135 mmol/L (ref 135–145)

## 2022-10-24 LAB — CBC
HCT: 36.1 % — ABNORMAL LOW (ref 39.0–52.0)
Hemoglobin: 10.9 g/dL — ABNORMAL LOW (ref 13.0–17.0)
MCH: 25.4 pg — ABNORMAL LOW (ref 26.0–34.0)
MCHC: 30.2 g/dL (ref 30.0–36.0)
MCV: 84.1 fL (ref 80.0–100.0)
Platelets: 142 10*3/uL — ABNORMAL LOW (ref 150–400)
RBC: 4.29 MIL/uL (ref 4.22–5.81)
RDW: 27.5 % — ABNORMAL HIGH (ref 11.5–15.5)
WBC: 7.7 10*3/uL (ref 4.0–10.5)
nRBC: 0 % (ref 0.0–0.2)

## 2022-10-24 LAB — GLUCOSE, CAPILLARY
Glucose-Capillary: 144 mg/dL — ABNORMAL HIGH (ref 70–99)
Glucose-Capillary: 166 mg/dL — ABNORMAL HIGH (ref 70–99)

## 2022-10-24 MED ORDER — MELATONIN 5 MG PO TABS: 5.0000 mg | ORAL_TABLET | Freq: Every day | ORAL | 0 refills | Status: DC

## 2022-10-24 MED ORDER — OXYCODONE HCL 5 MG PO TABS: 5.0000 mg | ORAL_TABLET | Freq: Two times a day (BID) | ORAL | 0 refills | Status: DC | PRN

## 2022-10-24 MED ORDER — FINASTERIDE 5 MG PO TABS: 5.0000 mg | ORAL_TABLET | Freq: Every day | ORAL | 2 refills | Status: AC

## 2022-10-24 MED ORDER — SENNOSIDES-DOCUSATE SODIUM 8.6-50 MG PO TABS: 1.0000 | ORAL_TABLET | Freq: Two times a day (BID) | ORAL | Status: DC

## 2022-10-24 MED ORDER — CYANOCOBALAMIN 1000 MCG PO TABS: 1000.0000 ug | ORAL_TABLET | Freq: Every day | ORAL | Status: DC

## 2022-10-24 MED ORDER — HYDROCORTISONE 5 MG PO TABS: 5.0000 mg | ORAL_TABLET | Freq: Every evening | ORAL | Status: DC

## 2022-10-24 MED ORDER — FUROSEMIDE 20 MG PO TABS: 20.0000 mg | ORAL_TABLET | Freq: Every day | ORAL | Status: DC

## 2022-10-24 MED ORDER — HYDROCORTISONE 10 MG PO TABS: 10.0000 mg | ORAL_TABLET | Freq: Every day | ORAL | Status: DC

## 2022-10-24 MED ORDER — B COMPLEX-C PO TABS: 1.0000 | ORAL_TABLET | Freq: Every day | ORAL | Status: DC

## 2022-10-24 MED ORDER — FERROUS SULFATE 325 (65 FE) MG PO TABS: 325.0000 mg | ORAL_TABLET | Freq: Every day | ORAL | 3 refills | Status: DC

## 2022-10-24 MED ORDER — BISACODYL 10 MG RE SUPP: 10.0000 mg | Freq: Every day | RECTAL | 0 refills | Status: DC | PRN

## 2022-10-24 MED ORDER — ENSURE ENLIVE PO LIQD: 237.0000 mL | Freq: Three times a day (TID) | ORAL | 12 refills | Status: DC

## 2022-10-24 MED ORDER — MIDODRINE HCL 5 MG PO TABS: 5.0000 mg | ORAL_TABLET | Freq: Three times a day (TID) | ORAL | Status: DC

## 2022-10-24 MED ORDER — INSULIN GLARGINE-YFGN 100 UNIT/ML ~~LOC~~ SOLN
7.0000 [IU] | Freq: Every day | SUBCUTANEOUS | Status: DC
  Administered 2022-10-24: 7 [IU] via SUBCUTANEOUS
  Filled 2022-10-24: qty 0.07

## 2022-10-24 MED ORDER — NYSTATIN 100000 UNIT/GM EX POWD: Freq: Two times a day (BID) | CUTANEOUS | 0 refills | Status: DC

## 2022-10-24 MED ORDER — POLYETHYLENE GLYCOL 3350 17 G PO PACK: 17.0000 g | PACK | Freq: Every day | ORAL | 0 refills | Status: DC | PRN

## 2022-10-24 NOTE — Progress Notes (Signed)
Glasses on the the bedside table, patient stated he will not take the glasses because it's not his.

## 2022-10-24 NOTE — Progress Notes (Signed)
Patient discharged to SNF via PTAR. Patient alert and oriented, in no distress. Discharge packet prepared by case manager and given to [place] for facility.

## 2022-10-24 NOTE — TOC Progression Note (Signed)
Transition of Care [facility]) - Progression Note    Patient Details  Name: Peter Yang MRN: [id number] Date of Birth: 06/11/1956  [facility]) CM/SW Contact  Kelsen Celona, Juliann Pulse, RN Phone Number: 10/24/2022, 9:54 AM  Clinical Narrative:[facility] rep [facility] [place] has auth-can accept once d/c summary available.MD notified.       Expected Discharge Plan: [place] Barriers to Discharge: Continued Medical Work up  Expected Discharge Plan and Services   Discharge Planning Services: CM Consult   Living arrangements for the past 2 months: [place] Determinants of Health (SDOH) Interventions [place]: No Food Insecurity (09/07/2022)  Housing: Low Risk  (09/07/2022)  Transportation Needs: Unmet Transportation Needs (09/07/2022)  Utilities: At Risk (09/07/2022)  Tobacco Use: Low Risk  (09/02/2022)    Readmission Risk Interventions    09/02/2022   10:36 AM  Readmission Risk Prevention Plan  Transportation Screening Complete  Medication Review ([place]) Complete  PCP or Specialist appointment within 3-5 days of discharge Complete  HRI or [place] Complete  SW Recovery Care/Counseling Consult Complete  [place] Complete

## 2022-10-24 NOTE — TOC Transition Note (Addendum)
Transition of Care [facility]) - CM/SW Discharge Note   Patient Details  Name: Peter Yang MRN: [id number] Date of Birth: 20-Jun-1956  [facility] CM/SW Contact:  Dessa Phi, RN Phone Number: 10/24/2022, 11:02 AM   Clinical Narrative:   d/c to [facility] rep Marita [place] able to accept-awaiting rm#,tel# for report prior PTAR. Patient states [name] (friend) aware of where he is going.No further CM needs. -11:20a-going to [facility] rm#[phone],report tel#[phone]. PTAR called.No further CM needs.    Final next level of care: Skilled Nursing Facility Barriers to Discharge: No Barriers Identified   Patient Goals and CMS Choice CMS Medicare.gov Compare Post Acute Care list provided to:: Patient Choice offered to / list presented to : Patient  Discharge Placement                Patient chooses bed at: Other - please specify in the comment section below: ([facility]) Patient to be transferred to facility by:  Corey Harold) Name of family member notified:  (Patient informed) Patient and family notified of of transfer: 10/24/22  Discharge Plan and Services Additional resources added to the After Visit Summary for     Discharge Planning Services: CM Consult                                 Social Determinants of Health (SDOH) Interventions SDOH Screenings   Food Insecurity: No Food Insecurity (09/07/2022)  Housing: [place]  (09/07/2022)  Transportation Needs: Unmet Transportation Needs (09/07/2022)  Utilities: At Risk (09/07/2022)  Tobacco Use: Low Risk  (09/02/2022)     Readmission Risk Interventions    09/02/2022   10:36 AM  Readmission Risk Prevention Plan  Transportation Screening Complete  Medication Review ([place]) Complete  PCP or Specialist appointment within 3-5 days of discharge Complete  HRI or [place] Complete  SW Recovery Care/Counseling Consult Complete  [place] Complete

## 2022-10-24 NOTE — Discharge Summary (Signed)
Physician Discharge Summary  Peter Yang DVV:[id number] DOB: 07/10/1956 DOA: 08/31/2022  PCP: Clovia Cuff, MD  Admit date: 08/31/2022 Discharge date: 10/24/2022 Recommendations for Outpatient Follow-up:  Follow up with PCP in 1 weeks-call for appointment Follow-up with urology for cystoscopy as outpatient Follow-up with encocrinology regarding steroid tapering Please obtain BMP/CBC in one week  Discharge Dispo: SNF Discharge Condition: Stable Code Status:   Code Status: DNR Diet recommendation:  Diet Order             Diet Carb Modified Fluid consistency: Thin; Room service appropriate? Yes  Diet effective now                    Brief/Interim Summary: 67 year old man who presented to the [facility] ED 11/11 with a chief complaint of hematuria. PMHx significant for HTN, HLD, HFrEF/ICM EF 40-45%, CAD/NSTEMI, TIA, PAF (on Eliquis), T2DM, BPH (hx prostate CA) with questionable length of time of hematuria which may have been going on since 10/13 per patient.  Sent to the emergency department by SNF staff on 11/11 and admitted.  Cultures obtained placed on antibiotics, patient had persistent hypotension pulmonary consulted was started on peripheral Levophed 11/12.  During his course patient has been seen by urology.  ID was also consulted patient was on IV antibiotics for 4 weeks.  Palliative care was consulted for symptom management and goals of care.  He was found to have shock with mixed picture septic from UTI cardiogenic shock and new depressed EF and hypovolemic shock. At this time he remains hemodynamically stable.  Completed antibiotics course.  PT OT following and awaiting for skilled nursing facility placement   Discharge Diagnoses:  Principal Problem:   Gross hematuria Active Problems:   Acute kidney injury ([place])   Coronary artery disease due to lipid rich plaque   UTI (urinary tract infection)   Borderline low blood pressure determined by examination   Lactic acidosis    Diabetic polyneuropathy associated with type 2 diabetes mellitus (HCC)   PAF (paroxysmal atrial fibrillation) (HCC)   Acute on chronic HFrEF (heart failure with reduced ejection fraction) (HCC)   Pressure ulcer of sacral region, stage 2 (HCC)   Dyslipidemia   Cellulitis of scrotum   Acute prostatitis   Septic shock (HCC)   Hyponatremia   Cardiogenic shock (HCC)   Physical deconditioning   Palliative care by specialist   Goals of care, counseling/discussion   AKI (acute kidney injury) ([place])   Mood altered   Medication management   Need for emotional support   Pain   High risk medication use   Counseling and coordination of care   Acute on chronic systolic congestive heart failure (HCC)   HFrEF (heart failure with reduced ejection fraction) (HCC)   Orthostatic hypotension   Hydronephrosis   Thrombocytopenia (HCC)  Shock multifactorial due to septic shock, cardiogenic shock and hypovolemic shock  Shock has resolved.  Patient is hemodynamically stable.Echo shows EF 30%.  Continue Lasix 20 mg daily monitor volume status. Borderline low blood pressure: BP stable.  Was started on stress dose steroid with plan to wean slowly. On midodrine as well. He is on Cortef 10 mg daily and 5 mg p.m>. Gross hematuria: Seen by urology concerned about obstructive uropathy secondary to gross hematuria. Catheter placed by uro and discontinued 10/01/2022. Cont external catheter.  Has history of prostate cancer- will need outpatient cystoscopy follow-up with urology.  Continue PT OT  AKI due to postobstructive uropathy:resolved Recent Labs  Lab 10/19/22 0315 10/22/22  1300 10/23/22 0815 10/24/22 0344  BUN 24* 26* 26* 26*  CREATININE 0.92 0.84 7.74 1.28    Complicated UTI with ESBL Prostatitis/scrotal cellulitis History of ESBL E. coli and CRE Proteus Urology and ID was consulted. Completed antibiotics and well x 4 weeks on 12/10  Lactic acidosis: Resolved  Type 2 diabetes mellitus with  long-term insulin use Diabetic polyneuropathy: Fairly stable blood sugar, diabetes coordinator following. Cont home meds, Recent Labs  Lab 10/23/22 0732 10/23/22 1123 10/23/22 1640 10/23/22 2100 10/24/22 0734  GLUCAP 196* 186* 170* 175* 144*   Acute on chronic systolic and diastolic CHF: Currently negative balance.  Monitor intake output PAF: cont Eliquis as OP. Sacral decubitus ulcer: Continue wound care and dressing changes Hyperlipidemia continue statin Swelling of the scrotum :Only minimal to pain Dr. Gloriann Loan examined the patient was unimpressed. Sacral decubitus ulcer stage II present on admission     Pressure Injury 08/03/22 Buttocks Mid Stage 2 -  Partial thickness loss of dermis presenting as a shallow open injury with a red, pink wound bed without slough. area of red blanchable and non blanchable, and areas with skin sloughed off (Active)  08/03/22 1513  Location: Buttocks  Location Orientation: Mid  Staging: Stage 2 -  Partial thickness loss of dermis presenting as a shallow open injury with a red, pink wound bed without slough.  Wound Description (Comments): area of red blanchable and non blanchable, and areas with skin sloughed off  Present on Admission: Yes  Dressing Type Foam - Lift dressing to assess site every shift    Class I Obesity:Patient's Body mass index is 31.69 kg/m. : Will benefit with PCP follow-up, weight loss  healthy lifestyle and outpatient sleep evaluation.  Consults: Palliative care, cardiology, infectious disease, critical care, urology Subjective: Alert awake oriented resting comfortably.  He is agreeable for discharge to skilled nursing facility as he has approval obtained today  Discharge Exam: Vitals:   10/23/22 2105 10/24/22 0514  BP: 96/61 102/66  Pulse: 67 70  Resp: 19 18  Temp: 98.2 F (36.8 C) 97.7 F (36.5 C)  SpO2: 97% 98%   General: Pt is alert, awake, not in acute distress Cardiovascular: RRR, S1/S2 +, no rubs, no  gallops Respiratory: CTA bilaterally, no wheezing, no rhonchi Abdominal: Soft, NT, ND, bowel sounds + Extremities: no edema, no cyanosis, right BKA Suprapubic pouch present mild edema erythema on the scrotum  Discharge Instructions  Discharge Instructions     Discharge instructions   Complete by: As directed    CBC BMP in 1 week from PCP.  Follow-up with endocrinology continue hydrocortisone until then  Please call call MD or return to ER for similar or worsening recurring problem that brought you to hospital or if any fever,nausea/vomiting,abdominal pain, uncontrolled pain, chest pain,  shortness of breath or any other alarming symptoms.  Please follow-up your doctor as instructed in a week time and call the office for appointment.  Please avoid alcohol, smoking, or any other illicit substance and maintain healthy habits including taking your regular medications as prescribed.  You were cared for by a hospitalist during your hospital stay. If you have any questions about your discharge medications or the care you received while you were in the hospital after you are discharged, you can call the unit and ask to speak with the hospitalist on call if the hospitalist that took care of you is not available.  Once you are discharged, your primary care physician will handle any further medical issues. Please  note that NO REFILLS for any discharge medications will be authorized once you are discharged, as it is imperative that you return to your primary care physician (or establish a relationship with a primary care physician if you do not have one) for your aftercare needs so that they can reassess your need for medications and monitor your lab values   Discharge wound care:   Complete by: As directed    Foam dressing to sacrum/buttocks, change Q 3 days or PRN   Increase activity slowly   Complete by: As directed       Allergies as of 10/24/2022   No Known Allergies      Medication List      STOP taking these medications    aspirin 81 MG chewable tablet   Magnesium 500 MG Tabs   sulfamethoxazole-trimethoprim 800-160 MG tablet Commonly known as: BACTRIM DS       TAKE these medications    acetaminophen 325 MG tablet Commonly known as: TYLENOL Take 2 tablets (650 mg total) by mouth every 6 (six) hours as needed for mild pain (or Fever >/= 101).   apixaban 5 MG Tabs tablet Commonly known as: ELIQUIS Take 1 tablet (5 mg total) by mouth 2 (two) times daily.   atorvastatin 80 MG tablet Commonly known as: LIPITOR Take 80 mg by mouth daily.   B-complex with vitamin C tablet Take 1 tablet by mouth daily.   bisacodyl 10 MG suppository Commonly known as: DULCOLAX Place 1 suppository (10 mg total) rectally daily as needed for moderate constipation.   clopidogrel 75 MG tablet Commonly known as: PLAVIX Take 1 tablet (75 mg total) by mouth daily.   cyanocobalamin 1000 MCG tablet Take 1 tablet (1,000 mcg total) by mouth daily.   diclofenac Sodium 1 % Gel Commonly known as: VOLTAREN Apply 2 g topically 2 (two) times daily as needed (pain).   feeding supplement Liqd Take 237 mLs by mouth 3 (three) times daily between meals.   ferrous sulfate 325 (65 FE) MG tablet Take 1 tablet (325 mg total) by mouth daily with breakfast. Start taking on: October 25, 2022   finasteride 5 MG tablet Commonly known as: PROSCAR Take 1 tablet (5 mg total) by mouth daily.   furosemide 20 MG tablet Commonly known as: LASIX Take 1 tablet (20 mg total) by mouth daily for 1 day.   gabapentin 300 MG capsule Commonly known as: NEURONTIN Take 300 mg by mouth at bedtime.   hydrocortisone 5 MG tablet Commonly known as: CORTEF Take 1 tablet (5 mg total) by mouth every evening.   hydrocortisone 10 MG tablet Commonly known as: CORTEF Take 1 tablet (10 mg total) by mouth daily.   Jardiance 10 MG Tabs tablet Generic drug: empagliflozin Take 10 mg by mouth daily at 12 noon.    melatonin 5 MG Tabs Take 1 tablet (5 mg total) by mouth daily at 6 PM.   metFORMIN 500 MG tablet Commonly known as: GLUCOPHAGE Take 1,000 mg by mouth 2 (two) times daily with a meal.   midodrine 5 MG tablet Commonly known as: PROAMATINE Take 1 tablet (5 mg total) by mouth with breakfast, with lunch, and with evening meal. What changed:  medication strength how much to take when to take this   nystatin powder Commonly known as: MYCOSTATIN/NYSTOP Apply topically 2 (two) times daily.   oxyCODONE 5 MG immediate release tablet Commonly known as: Oxy IR/ROXICODONE Take 1 tablet (5 mg total) by mouth every 12 (twelve) hours  as needed for up to 4 doses for moderate pain, severe pain or breakthrough pain.   pantoprazole 40 MG tablet Commonly known as: PROTONIX Take 1 tablet (40 mg total) by mouth daily.   polyethylene glycol 17 g packet Commonly known as: MIRALAX / GLYCOLAX Take 17 g by mouth daily as needed for mild constipation.   potassium chloride SA 20 MEQ tablet Commonly known as: KLOR-CON M Take 20 mEq by mouth daily.   Pro-Stat AWC Liqd Take 30 mLs by mouth 2 (two) times daily.   saccharomyces boulardii 250 MG capsule Commonly known as: FLORASTOR Take 250 mg by mouth 2 (two) times daily.   senna-docusate 8.6-50 MG tablet Commonly known as: Senokot-S Take 1 tablet by mouth 2 (two) times daily.   Systane Balance 0.6 % Soln Generic drug: Propylene Glycol Place 1 drop into both eyes daily as needed.   tamsulosin 0.4 MG Caps capsule Commonly known as: FLOMAX Take 0.4 mg by mouth daily.               Discharge Care Instructions  (From admission, onward)           Start     Ordered   10/24/22 0000  Discharge wound care:       Comments: Foam dressing to sacrum/buttocks, change Q 3 days or PRN   10/24/22 1016            Contact information for after-discharge care     Destination     [facility] .   Service: Skilled  Nursing Contact information: [place] [phone]                    No Known Allergies  The results of significant diagnostics from this hospitalization (including imaging, microbiology, ancillary and laboratory) are listed below for reference.    Microbiology: No results found for this or any previous visit (from the past 240 hour(s)).  Procedures/Studies: No results found.  Labs: BNP (last 3 results) Recent Labs    09/03/22 0415 09/06/22 0500  BNP 3,094.3* 9,163.8*   Basic Metabolic Panel: Recent Labs  Lab 10/19/22 0315 10/22/22 1300 10/23/22 0815 10/24/22 0344  NA 136 137 136 135  K 3.2* 3.1* 3.4* 3.8  CL 101 98 100 102  CO2 '31 29 30 28  '$ GLUCOSE 220* 177* 230* 151*  BUN 24* 26* 26* 26*  CREATININE 0.92 0.84 0.88 0.92  CALCIUM 8.0* 8.3* 8.3* 8.4*   Liver Function Tests: Recent Labs  Lab 10/19/22 0315  AST 17  ALT 18  ALKPHOS 64  BILITOT 0.6  PROT 4.6*  ALBUMIN 2.5*   No results for input(s): "LIPASE", "AMYLASE" in the last 168 hours. No results for input(s): "AMMONIA" in the last 168 hours. CBC: Recent Labs  Lab 10/24/22 0344  WBC 7.7  HGB 10.9*  HCT 36.1*  MCV 84.1  PLT 142*   Cardiac Enzymes: No results for input(s): "CKTOTAL", "CKMB", "CKMBINDEX", "TROPONINI" in the last 168 hours. BNP: Invalid input(s): "POCBNP" CBG: Recent Labs  Lab 10/23/22 0732 10/23/22 1123 10/23/22 1640 10/23/22 2100 10/24/22 0734  GLUCAP 196* 186* 170* 175* 144*   D-Dimer No results for input(s): "DDIMER" in the last 72 hours. Hgb A1c No results for input(s): "HGBA1C" in the last 72 hours. Lipid Profile No results for input(s): "CHOL", "HDL", "LDLCALC", "TRIG", "CHOLHDL", "LDLDIRECT" in the last 72 hours. Thyroid function studies No results for input(s): "TSH", "T4TOTAL", "T3FREE", "THYROIDAB" in  the last 72 hours.  Invalid input(s): "FREET3" Anemia work up No results for input(s): "VITAMINB12",  "FOLATE", "FERRITIN", "TIBC", "IRON", "RETICCTPCT" in the last 72 hours. Urinalysis    Component Value Date/Time   COLORURINE RED (A) 08/31/2022 1622   APPEARANCEUR TURBID (A) 08/31/2022 1622   LABSPEC  08/31/2022 1622    TEST NOT REPORTED DUE TO COLOR INTERFERENCE OF URINE PIGMENT   PHURINE  08/31/2022 1622    TEST NOT REPORTED DUE TO COLOR INTERFERENCE OF URINE PIGMENT   GLUCOSEU (A) 08/31/2022 1622    TEST NOT REPORTED DUE TO COLOR INTERFERENCE OF URINE PIGMENT   HGBUR (A) 08/31/2022 1622    TEST NOT REPORTED DUE TO COLOR INTERFERENCE OF URINE PIGMENT   BILIRUBINUR (A) 08/31/2022 1622    TEST NOT REPORTED DUE TO COLOR INTERFERENCE OF URINE PIGMENT   KETONESUR (A) 08/31/2022 1622    TEST NOT REPORTED DUE TO COLOR INTERFERENCE OF URINE PIGMENT   PROTEINUR (A) 08/31/2022 1622    TEST NOT REPORTED DUE TO COLOR INTERFERENCE OF URINE PIGMENT   NITRITE (A) 08/31/2022 1622    TEST NOT REPORTED DUE TO COLOR INTERFERENCE OF URINE PIGMENT   LEUKOCYTESUR (A) 08/31/2022 1622    TEST NOT REPORTED DUE TO COLOR INTERFERENCE OF URINE PIGMENT   Sepsis Labs Recent Labs  Lab 10/24/22 0344  WBC 7.7   Microbiology No results found for this or any previous visit (from the past 240 hour(s)).   Time coordinating discharge: 35 minutes  SIGNED: Antonieta Pert, MD  [facility] 10/24/2022, 10:16 AM  If 7PM-7AM, please contact night-coverage [email or web address]

## 2022-10-24 NOTE — Progress Notes (Signed)
Patient to be discharged to [facility]/Rehab Report to called to the facility.

## 2023-04-01 ENCOUNTER — Other Ambulatory Visit: Payer: Self-pay

## 2023-04-01 ENCOUNTER — Inpatient Hospital Stay (HOSPITAL_COMMUNITY)
Admission: EM | Admit: 2023-04-01 | Discharge: 2023-04-18 | DRG: 853 | Disposition: A | Discharge status: Skilled Nursing Facility | Payer: Medicare Other | Attending: Family Medicine | Admitting: Family Medicine

## 2023-04-01 ENCOUNTER — Observation Stay (HOSPITAL_COMMUNITY): Payer: Medicare Other

## 2023-04-01 ENCOUNTER — Encounter (HOSPITAL_COMMUNITY): Payer: Self-pay | Admitting: Emergency Medicine

## 2023-04-01 DIAGNOSIS — R310 Gross hematuria: Secondary | ICD-10-CM | POA: Diagnosis present

## 2023-04-01 DIAGNOSIS — Z7901 Long term (current) use of anticoagulants: Secondary | ICD-10-CM

## 2023-04-01 DIAGNOSIS — J9811 Atelectasis: Secondary | ICD-10-CM | POA: Diagnosis not present

## 2023-04-01 DIAGNOSIS — R0902 Hypoxemia: Secondary | ICD-10-CM | POA: Diagnosis not present

## 2023-04-01 DIAGNOSIS — I951 Orthostatic hypotension: Secondary | ICD-10-CM | POA: Diagnosis present

## 2023-04-01 DIAGNOSIS — E1142 Type 2 diabetes mellitus with diabetic polyneuropathy: Secondary | ICD-10-CM | POA: Diagnosis present

## 2023-04-01 DIAGNOSIS — I9589 Other hypotension: Secondary | ICD-10-CM | POA: Diagnosis present

## 2023-04-01 DIAGNOSIS — Z89421 Acquired absence of other right toe(s): Secondary | ICD-10-CM

## 2023-04-01 DIAGNOSIS — Z89512 Acquired absence of left leg below knee: Secondary | ICD-10-CM

## 2023-04-01 DIAGNOSIS — E1152 Type 2 diabetes mellitus with diabetic peripheral angiopathy with gangrene: Secondary | ICD-10-CM | POA: Diagnosis present

## 2023-04-01 DIAGNOSIS — I051 Rheumatic mitral insufficiency: Secondary | ICD-10-CM | POA: Diagnosis present

## 2023-04-01 DIAGNOSIS — I480 Paroxysmal atrial fibrillation: Secondary | ICD-10-CM | POA: Diagnosis present

## 2023-04-01 DIAGNOSIS — I6601 Occlusion and stenosis of right middle cerebral artery: Secondary | ICD-10-CM | POA: Diagnosis present

## 2023-04-01 DIAGNOSIS — Z809 Family history of malignant neoplasm, unspecified: Secondary | ICD-10-CM

## 2023-04-01 DIAGNOSIS — R339 Retention of urine, unspecified: Secondary | ICD-10-CM | POA: Diagnosis not present

## 2023-04-01 DIAGNOSIS — G4733 Obstructive sleep apnea (adult) (pediatric): Secondary | ICD-10-CM | POA: Diagnosis present

## 2023-04-01 DIAGNOSIS — C61 Malignant neoplasm of prostate: Secondary | ICD-10-CM | POA: Diagnosis present

## 2023-04-01 DIAGNOSIS — Y835 Amputation of limb(s) as the cause of abnormal reaction of the patient, or of later complication, without mention of misadventure at the time of the procedure: Secondary | ICD-10-CM | POA: Diagnosis present

## 2023-04-01 DIAGNOSIS — I5022 Chronic systolic (congestive) heart failure: Secondary | ICD-10-CM | POA: Diagnosis not present

## 2023-04-01 DIAGNOSIS — I2583 Coronary atherosclerosis due to lipid rich plaque: Secondary | ICD-10-CM | POA: Diagnosis present

## 2023-04-01 DIAGNOSIS — D62 Acute posthemorrhagic anemia: Secondary | ICD-10-CM | POA: Diagnosis not present

## 2023-04-01 DIAGNOSIS — Z7984 Long term (current) use of oral hypoglycemic drugs: Secondary | ICD-10-CM

## 2023-04-01 DIAGNOSIS — E871 Hypo-osmolality and hyponatremia: Secondary | ICD-10-CM | POA: Diagnosis not present

## 2023-04-01 DIAGNOSIS — M7989 Other specified soft tissue disorders: Principal | ICD-10-CM

## 2023-04-01 DIAGNOSIS — Z7952 Long term (current) use of systemic steroids: Secondary | ICD-10-CM

## 2023-04-01 DIAGNOSIS — N493 Fournier gangrene: Secondary | ICD-10-CM

## 2023-04-01 DIAGNOSIS — I255 Ischemic cardiomyopathy: Secondary | ICD-10-CM | POA: Diagnosis present

## 2023-04-01 DIAGNOSIS — Z8249 Family history of ischemic heart disease and other diseases of the circulatory system: Secondary | ICD-10-CM

## 2023-04-01 DIAGNOSIS — Z66 Do not resuscitate: Secondary | ICD-10-CM | POA: Diagnosis not present

## 2023-04-01 DIAGNOSIS — I10 Essential (primary) hypertension: Secondary | ICD-10-CM | POA: Diagnosis present

## 2023-04-01 DIAGNOSIS — Z7401 Bed confinement status: Secondary | ICD-10-CM

## 2023-04-01 DIAGNOSIS — Z6828 Body mass index (BMI) 28.0-28.9, adult: Secondary | ICD-10-CM

## 2023-04-01 DIAGNOSIS — Z955 Presence of coronary angioplasty implant and graft: Secondary | ICD-10-CM

## 2023-04-01 DIAGNOSIS — Y846 Urinary catheterization as the cause of abnormal reaction of the patient, or of later complication, without mention of misadventure at the time of the procedure: Secondary | ICD-10-CM | POA: Diagnosis present

## 2023-04-01 DIAGNOSIS — E1151 Type 2 diabetes mellitus with diabetic peripheral angiopathy without gangrene: Secondary | ICD-10-CM | POA: Diagnosis present

## 2023-04-01 DIAGNOSIS — L089 Local infection of the skin and subcutaneous tissue, unspecified: Secondary | ICD-10-CM | POA: Diagnosis present

## 2023-04-01 DIAGNOSIS — Z87898 Personal history of other specified conditions: Secondary | ICD-10-CM

## 2023-04-01 DIAGNOSIS — N3001 Acute cystitis with hematuria: Secondary | ICD-10-CM

## 2023-04-01 DIAGNOSIS — E8809 Other disorders of plasma-protein metabolism, not elsewhere classified: Secondary | ICD-10-CM | POA: Diagnosis not present

## 2023-04-01 DIAGNOSIS — Z79899 Other long term (current) drug therapy: Secondary | ICD-10-CM

## 2023-04-01 DIAGNOSIS — T83511A Infection and inflammatory reaction due to indwelling urethral catheter, initial encounter: Secondary | ICD-10-CM | POA: Diagnosis present

## 2023-04-01 DIAGNOSIS — I252 Old myocardial infarction: Secondary | ICD-10-CM

## 2023-04-01 DIAGNOSIS — N401 Enlarged prostate with lower urinary tract symptoms: Secondary | ICD-10-CM | POA: Diagnosis present

## 2023-04-01 DIAGNOSIS — Z6831 Body mass index (BMI) 31.0-31.9, adult: Secondary | ICD-10-CM

## 2023-04-01 DIAGNOSIS — Z7902 Long term (current) use of antithrombotics/antiplatelets: Secondary | ICD-10-CM

## 2023-04-01 DIAGNOSIS — L89153 Pressure ulcer of sacral region, stage 3: Secondary | ICD-10-CM | POA: Diagnosis present

## 2023-04-01 DIAGNOSIS — N138 Other obstructive and reflux uropathy: Secondary | ICD-10-CM | POA: Diagnosis present

## 2023-04-01 DIAGNOSIS — I5043 Acute on chronic combined systolic (congestive) and diastolic (congestive) heart failure: Secondary | ICD-10-CM | POA: Diagnosis not present

## 2023-04-01 DIAGNOSIS — E2740 Unspecified adrenocortical insufficiency: Secondary | ICD-10-CM | POA: Diagnosis present

## 2023-04-01 DIAGNOSIS — Z833 Family history of diabetes mellitus: Secondary | ICD-10-CM

## 2023-04-01 DIAGNOSIS — A419 Sepsis, unspecified organism: Secondary | ICD-10-CM | POA: Diagnosis not present

## 2023-04-01 DIAGNOSIS — I110 Hypertensive heart disease with heart failure: Secondary | ICD-10-CM | POA: Diagnosis present

## 2023-04-01 DIAGNOSIS — E1169 Type 2 diabetes mellitus with other specified complication: Secondary | ICD-10-CM | POA: Diagnosis present

## 2023-04-01 DIAGNOSIS — Z947 Corneal transplant status: Secondary | ICD-10-CM

## 2023-04-01 DIAGNOSIS — N390 Urinary tract infection, site not specified: Secondary | ICD-10-CM | POA: Diagnosis present

## 2023-04-01 DIAGNOSIS — I2510 Atherosclerotic heart disease of native coronary artery without angina pectoris: Secondary | ICD-10-CM | POA: Diagnosis present

## 2023-04-01 DIAGNOSIS — E785 Hyperlipidemia, unspecified: Secondary | ICD-10-CM | POA: Diagnosis present

## 2023-04-01 DIAGNOSIS — I7121 Aneurysm of the ascending aorta, without rupture: Secondary | ICD-10-CM | POA: Diagnosis present

## 2023-04-01 DIAGNOSIS — Z8673 Personal history of transient ischemic attack (TIA), and cerebral infarction without residual deficits: Secondary | ICD-10-CM

## 2023-04-01 DIAGNOSIS — E441 Mild protein-calorie malnutrition: Secondary | ICD-10-CM | POA: Diagnosis present

## 2023-04-01 DIAGNOSIS — M009 Pyogenic arthritis, unspecified: Secondary | ICD-10-CM | POA: Diagnosis present

## 2023-04-01 LAB — BASIC METABOLIC PANEL
Anion gap: 11 (ref 5–15)
BUN: 32 mg/dL — ABNORMAL HIGH (ref 8–23)
CO2: 24 mmol/L (ref 22–32)
Calcium: 8.4 mg/dL — ABNORMAL LOW (ref 8.9–10.3)
Chloride: 96 mmol/L — ABNORMAL LOW (ref 98–111)
Creatinine, Ser: 1.13 mg/dL (ref 0.61–1.24)
GFR, Estimated: >60 mL/min (ref >60)
Glucose, Bld: 191 mg/dL — ABNORMAL HIGH (ref 70–99)
Potassium: 4.1 mmol/L (ref 3.5–5.1)
Sodium: 131 mmol/L — ABNORMAL LOW (ref 135–145)

## 2023-04-01 LAB — URINALYSIS, W/ REFLEX TO CULTURE (INFECTION SUSPECTED)
Bacteria, UA: NONE SEEN
RBC / HPF: >50 RBC/hpf (ref 0–5)

## 2023-04-01 LAB — CBC WITH DIFFERENTIAL/PLATELET
Abs Immature Granulocytes: 0.09 10*3/uL — ABNORMAL HIGH (ref 0.00–0.07)
Basophils Absolute: 0.1 10*3/uL (ref 0.0–0.1)
Basophils Relative: 0 %
Eosinophils Absolute: 0.5 10*3/uL (ref 0.0–0.5)
Eosinophils Relative: 4 %
HCT: 33 % — ABNORMAL LOW (ref 39.0–52.0)
Hemoglobin: 9.8 g/dL — ABNORMAL LOW (ref 13.0–17.0)
Immature Granulocytes: 1 %
Lymphocytes Relative: 7 %
Lymphs Abs: 0.9 10*3/uL (ref 0.7–4.0)
MCH: 24 pg — ABNORMAL LOW (ref 26.0–34.0)
MCHC: 29.7 g/dL — ABNORMAL LOW (ref 30.0–36.0)
MCV: 80.7 fL (ref 80.0–100.0)
Monocytes Absolute: 1 10*3/uL (ref 0.1–1.0)
Monocytes Relative: 8 %
Neutro Abs: 10.1 10*3/uL — ABNORMAL HIGH (ref 1.7–7.7)
Neutrophils Relative %: 80 %
Platelets: 277 10*3/uL (ref 150–400)
RBC: 4.09 MIL/uL — ABNORMAL LOW (ref 4.22–5.81)
RDW: 19.3 % — ABNORMAL HIGH (ref 11.5–15.5)
WBC: 12.7 10*3/uL — ABNORMAL HIGH (ref 4.0–10.5)
nRBC: 0 % (ref 0.0–0.2)

## 2023-04-01 MED ORDER — SODIUM CHLORIDE 0.9 % IV SOLN
1.0000 g | Freq: Three times a day (TID) | INTRAVENOUS | Status: DC
  Administered 2023-04-02 – 2023-04-10 (×26): 1 g via INTRAVENOUS
  Filled 2023-04-01 (×28): qty 20

## 2023-04-01 MED ORDER — SODIUM CHLORIDE 0.9 % IV SOLN
2.0000 g | Freq: Once | INTRAVENOUS | Status: DC
  Filled 2023-04-01: qty 40

## 2023-04-01 MED ORDER — SODIUM CHLORIDE 0.9 % IV BOLUS
1000.0000 mL | Freq: Once | INTRAVENOUS | Status: AC
  Administered 2023-04-01: 1000 mL via INTRAVENOUS

## 2023-04-01 MED ORDER — SODIUM CHLORIDE 0.9 % IV SOLN
1.0000 g | Freq: Once | INTRAVENOUS | Status: AC
  Administered 2023-04-01: 1 g via INTRAVENOUS
  Filled 2023-04-01: qty 10

## 2023-04-01 NOTE — Assessment & Plan Note (Addendum)
Verified with pt that he is a DNR/DNI.

## 2023-04-01 NOTE — Assessment & Plan Note (Signed)
Observation med/surg bed. 07-2022 admission at [facility] for ESBL E. Coli UTI. Consult pharmacy for meropenem.

## 2023-04-01 NOTE — Assessment & Plan Note (Signed)
Pt remains non-weight bearing right foot

## 2023-04-01 NOTE — Assessment & Plan Note (Signed)
Foley catheter placed on Saturday 03-29-2023. Start flomax

## 2023-04-01 NOTE — Assessment & Plan Note (Signed)
Stable

## 2023-04-01 NOTE — Subjective & Objective (Signed)
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  Patient had acute urinary tension on Saturday, June 8.  He had a Foley catheter placed at that time.  He started developing pain in his penis yesterday.  He was sent to the ER for evaluation.  Arrival temp 98.4 heart rate 100 blood pressure 95/66  White count 12.7, hemoglobin 9.8, platelets of 277  Sodium 131, potassium 4.1, bicarb 24, BUN of 32, creatinine 1.1, glucose 191  UA showed turbid colored urine.  Greater than 50 RBCs.  [facility] hospital[facility] contacted for admission.  Upon review of his chart, patient had been admitted to [facility] back in October 2023 where he was treated with ertapenem for a ESBL E. coli UTI.

## 2023-04-01 NOTE — Progress Notes (Addendum)
Pharmacy Antibiotic Note  Peter Yang is a 67 y.o. male for which pharmacy has been consulted for meropenem dosing for UTI. Patient comes from [facility] for evaluation of discolored urine and hematuria. He had an episode of urinary retention over the weekend on Saturday and foley catheter was placed. Today, staff noticed purulence and blood and was sent to ED for further evaluation.  Patient with a history of ESBL e. Coli and proteus in urine culture that was treated with IV carbapenem followed by 6 weeks of Bactrim. Patient urine culture unable to be located.  SCr 1.13 (bl~0.9-1) WBC 12.7; afebrile  Plan: Meropenem 1g IV every 8 hours Trend WBC, Fever, Renal function F/u cultures, clinical course, WBC De-escalate when able F/u ID recommendations  Height: 6\' 4"  (193 cm) Weight: 118.1 kg (260 lb 5.8 oz) IBW/kg (Calculated) : 86.8  Temp (24hrs), Avg:98.4 F (36.9 C), Min:98.4 F (36.9 C), Max:98.5 F (36.9 C)  Recent Labs  Lab 04/01/23 1817  WBC 12.7*  CREATININE 1.13     Estimated Creatinine Clearance: 90.3 mL/min (by C-G formula based on SCr of 1.13 mg/dL).    No Known Allergies  Antimicrobials this admission: meropenem 6/11 >>   Microbiology results: Ucx: pending  Thank you for allowing pharmacy to be a part of this patient's care.  Arabella Merles, PharmD. [facility] Acute Care PGY-1 04/01/2023 11:49 PM   ADDENDUM -CT renal stone study shows gas in SQ at base of penis and into ant abd wall. Patient with Fournier's gangrene. Will add additional coverage for 3 day period. Discussed with ED providers for additional coverage.  Plan -clindamycin 900mg  IV every 8 hours -vancomycin 2g x1 -Vancomycin 1000mg  IV every 12 hours (AUC 484, Vd 0.5, sCr 1.13)

## 2023-04-01 NOTE — H&P (Addendum)
History and Physical    Abdulmajid Sunn VWU:[id number] DOB: [birth year] DOA: 04/01/2023  DOS: the patient was seen and examined on 04/01/2023  PCP: Annita Brod, MD   Patient coming [facility]: [facility]  I have personally briefly reviewed patient's old medical records in [facility]  CC: blood and pus in foley catheter HPI: 67 year old male with a history of left below-knee amputation, chronic systolic heart failure, history of A-fib, recent admission in April 2024 at [facility] for right diabetic foot ulcer status post fifth ray amputation, hypertension, type 2 diabetes who presents to the ER today from [place] nursing home due to pus and blood from his Foley catheter.  Patient had acute urinary tension on Saturday, June 8.  He had a Foley catheter placed at that time.  He started developing pain in his penis yesterday.  He was sent to the ER for evaluation.  Arrival temp 98.4 heart rate 100 blood pressure 95/66  White count 12.7, hemoglobin 9.8, platelets of 277  Sodium 131, potassium 4.1, bicarb 24, BUN of 32, creatinine 1.1, glucose 191  UA showed turbid colored urine.  Greater than 50 RBCs.  [facility] contacted for admission.  Upon review of his chart, patient had been admitted to [facility] back in October 2023 where he was treated with ertapenem for a ESBL E. coli UTI.      ED Course: WBC 12.5  Review of Systems:  Review of Systems  Constitutional: Negative.  Negative for fever.  HENT: Negative.    Eyes: Negative.   Respiratory: Negative.    Cardiovascular: Negative.   Gastrointestinal: Negative.   Genitourinary:        Blood and pus in his Foley catheter.  Penile pain. Prepubic pain.  Musculoskeletal: Negative.   Skin: Negative.   Neurological: Negative.   Endo/Heme/Allergies: Negative.   Psychiatric/Behavioral: Negative.    All other systems reviewed and are negative.   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 Overview:  Added automatically from request for surgery [id number] Formatting of this note might be different from the original. Added automatically from request for surgery [id number]   Large bowel perforation (HCC) 09/27/2015   Malignant neoplasm of prostate (HCC) 10/22/2019   Myopia with astigmatism and presbyopia, bilateral 06/15/2018   Last Assessment & Plan:  Formatting of this note might be different from the original. - Wrx printed Formatting of this note might be different from the original. 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 - Stable, monitor Formatting of this note might be different from the original. Last Assessment & Plan:  - Stable, monitor   Retroperitoneal abscess (HCC) 10/02/2015   Status post corneal transplant 10/07/2016   Formatting of this note might be different from the original. Overview:  Added automatically from request for  surgery [id number] Formatting of this note might be different from the original. Added automatically from request for surgery [id number]  Last Assessment & Plan:  Formatting of this note might be different from the original. - OD, 2/2 KCN - Stable, no NVK / rejection - Continue off steroids - Mo   Thoracic ascending aortic aneurysm (HCC)    TIA (transient ischemic attack) 2012   Type 2 diabetes mellitus (HCC)     Past Surgical History:  Procedure Laterality Date   BELOW KNEE LEG AMPUTATION Left 2022   CORONARY ANGIOPLASTY WITH STENT PLACEMENT  01/2022   DES RCA   LEFT HEART CATH AND CORONARY ANGIOGRAPHY N/A 06/17/2022   Procedure: LEFT HEART CATH AND CORONARY ANGIOGRAPHY;  Surgeon: Yvonne Kendall, MD;  Location: [facility] INVASIVE CV [facility];  Service: Cardiovascular;  Laterality: N/A;   ROTATOR CUFF REPAIR Left      reports that he has never smoked. He has never used smokeless tobacco. He reports that he does not drink alcohol and does not use drugs.  No Known Allergies  Family History  Problem Relation Age of Onset   Diabetes Mother    Hypertension Mother    Heart disease Mother    Cancer Father    Heart attack Sister    Cancer Sister    Diabetes Brother     Prior to Admission medications   Medication Sig Start Date End Date Taking? Authorizing Provider  acetaminophen (TYLENOL) 325 MG tablet Take 2 tablets (650 mg total) by mouth every 6 (six) hours as needed for mild pain (or Fever >/= 101). 07/30/21   Sheikh, Omair Latif, DO  Amino Acids-Protein Hydrolys (PRO-STAT AWC) LIQD Take 30 mLs by mouth 2 (two) times daily.    [provider]  apixaban (ELIQUIS) 5 MG TABS tablet Take 1 tablet (5 mg total) by mouth 2 (two) times daily. 08/16/22 09/15/22  Levin Erp, MD  atorvastatin (LIPITOR) 80 MG tablet Take 80 mg by mouth daily. 03/17/21   [provider]  B Complex-C (B-COMPLEX WITH VITAMIN C) tablet Take 1 tablet by mouth daily. 10/24/22   Lanae Boast, MD  bisacodyl  (DULCOLAX) 10 MG suppository Place 1 suppository (10 mg total) rectally daily as needed for moderate constipation. 10/24/22   Lanae Boast, MD  clopidogrel (PLAVIX) 75 MG tablet Take 1 tablet (75 mg total) by mouth daily. 08/16/22   Levin Erp, MD  cyanocobalamin 1000 MCG tablet Take 1 tablet (1,000 mcg total) by mouth daily. 10/24/22   Lanae Boast, MD  diclofenac Sodium (VOLTAREN) 1 % GEL Apply 2 g topically 2 (two) times daily as needed (pain). 07/20/21   [provider]  empagliflozin (JARDIANCE) 10 MG TABS tablet Take 10 mg by mouth daily at 12 noon.    [provider]  feeding supplement (ENSURE ENLIVE / ENSURE PLUS) LIQD Take 237 mLs by mouth 3 (three) times daily between meals. 10/24/22   Lanae Boast, MD  ferrous sulfate 325 (65 FE) MG tablet Take 1 tablet (325 mg total) by mouth daily with breakfast. 10/25/22   Lanae Boast, MD  furosemide (LASIX) 20 MG tablet Take 1 tablet (  20 mg total) by mouth daily for 1 day. 10/24/22 10/25/22  Lanae Boast, MD  gabapentin (NEURONTIN) 300 MG capsule Take 300 mg by mouth at bedtime. 07/20/21   [provider]  hydrocortisone (CORTEF) 10 MG tablet Take 1 tablet (10 mg total) by mouth daily. 10/24/22   Lanae Boast, MD  hydrocortisone (CORTEF) 5 MG tablet Take 1 tablet (5 mg total) by mouth every evening. 10/24/22   Lanae Boast, MD  melatonin 5 MG TABS Take 1 tablet (5 mg total) by mouth daily at 6 PM. 10/24/22   Lanae Boast, MD  metFORMIN (GLUCOPHAGE) 500 MG tablet Take 1,000 mg by mouth 2 (two) times daily with a meal.    [provider]  midodrine (PROAMATINE) 5 MG tablet Take 1 tablet (5 mg total) by mouth with breakfast, with lunch, and with evening meal. 10/24/22   Lanae Boast, MD  nystatin (MYCOSTATIN/NYSTOP) powder Apply topically 2 (two) times daily. 10/24/22   Lanae Boast, MD  oxyCODONE (OXY IR/ROXICODONE) 5 MG immediate release tablet Take 1 tablet (5 mg total) by mouth every 12 (twelve) hours as needed for up to 4 doses for moderate pain,  severe pain or breakthrough pain. 10/24/22   Lanae Boast, MD  pantoprazole (PROTONIX) 40 MG tablet Take 1 tablet (40 mg total) by mouth daily. 06/30/22   Pokhrel, Rebekah Chesterfield, MD  polyethylene glycol (MIRALAX / GLYCOLAX) 17 g packet Take 17 g by mouth daily as needed for mild constipation. 10/24/22   Lanae Boast, MD  potassium chloride SA (KLOR-CON M) 20 MEQ tablet Take 20 mEq by mouth daily.    [provider]  Propylene Glycol (SYSTANE BALANCE) 0.6 % SOLN Place 1 drop into both eyes daily as needed.    [provider]  saccharomyces boulardii (FLORASTOR) 250 MG capsule Take 250 mg by mouth 2 (two) times daily.    [provider]  senna-docusate (SENOKOT-S) 8.6-50 MG tablet Take 1 tablet by mouth 2 (two) times daily. 10/24/22   Lanae Boast, MD  tamsulosin (FLOMAX) 0.4 MG CAPS capsule Take 0.4 mg by mouth daily. 07/29/22   [provider]    Physical Exam: Vitals:   04/01/23 2030 04/01/23 2032 04/01/23 2033 04/01/23 2136  BP:   (!) 99/53   Pulse: 86 87 88   Resp: 19 17 18    Temp:    98.5 F (36.9 C)  TempSrc:    Oral  SpO2: 97% 97% 99%   Weight:      Height:        Physical Exam Vitals and nursing note reviewed.  Constitutional:      Comments: Chronically ill appearing  HENT:     Head: Normocephalic and atraumatic.     Nose: Nose normal.  Eyes:     General: No scleral icterus. Cardiovascular:     Rate and Rhythm: Normal rate and regular rhythm.     Pulses: Normal pulses.     Heart sounds: Murmur heard.  Pulmonary:     Effort: Pulmonary effort is normal.     Breath sounds: Normal breath sounds.  Abdominal:     General: Bowel sounds are normal. There is no distension.  Musculoskeletal:     Comments: Left BKA Right foot wrapped  Skin:    Capillary Refill: Capillary refill takes less than 2 seconds.  Neurological:     Mental Status: He is oriented to person, place, and time.      Labs on Admission: I have personally reviewed following labs and  imaging studies  CBC: Recent Labs  Lab 04/01/23 1817  WBC 12.7*  NEUTROABS 10.1*  HGB 9.8*  HCT 33.0*  MCV 80.7  PLT 277   Basic Metabolic Panel: Recent Labs  Lab 04/01/23 1817  NA 131*  K 4.1  CL 96*  CO2 24  GLUCOSE 191*  BUN 32*  CREATININE 1.13  CALCIUM 8.4*   GFR: Estimated Creatinine Clearance: 90.3 mL/min (by C-G formula based on SCr of 1.13 mg/dL).  BNP (last 3 results) Recent Labs    09/03/22 0415 09/06/22 0500  BNP 3,094.3* 2,623.1*   Urine analysis:    Component Value Date/Time   COLORURINE RED (A) 04/01/2023 1746   APPEARANCEUR TURBID (A) 04/01/2023 1746   LABSPEC  04/01/2023 1746    TEST NOT REPORTED DUE TO COLOR INTERFERENCE OF URINE PIGMENT   PHURINE  04/01/2023 1746    TEST NOT REPORTED DUE TO COLOR INTERFERENCE OF URINE PIGMENT   GLUCOSEU (A) 04/01/2023 1746    TEST NOT REPORTED DUE TO COLOR INTERFERENCE OF URINE PIGMENT   HGBUR (A) 04/01/2023 1746    TEST NOT REPORTED DUE TO COLOR INTERFERENCE OF URINE PIGMENT   BILIRUBINUR (A) 04/01/2023 1746    TEST NOT REPORTED DUE TO COLOR INTERFERENCE OF URINE PIGMENT   KETONESUR (A) 04/01/2023 1746    TEST NOT REPORTED DUE TO COLOR INTERFERENCE OF URINE PIGMENT   PROTEINUR (A) 04/01/2023 1746    TEST NOT REPORTED DUE TO COLOR INTERFERENCE OF URINE PIGMENT   NITRITE (A) 04/01/2023 1746    TEST NOT REPORTED DUE TO COLOR INTERFERENCE OF URINE PIGMENT   LEUKOCYTESUR (A) 04/01/2023 1746    TEST NOT REPORTED DUE TO COLOR INTERFERENCE OF URINE PIGMENT   Radiological Exams on Admission: I have personally reviewed images No results found.  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 07-2022 admission at [facility] for ESBL E. Coli UTI. Consult pharmacy for meropenem.  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  Gross hematuria Due to foley and UTI. May need to hold Eliquis if large amount of bleeding.  Type 2 diabetes mellitus with diabetic peripheral angiopathy without gangrene, without long-term current use of insulin (HCC) Continue with SSI and lantus.  History of urinary retention Foley catheter placed on Saturday 03-29-2023. Start flomax  DNR (do not resuscitate) Verified with pt that he is a DNR/DNI.       Chronic HFrEF (heart failure with reduced ejection fraction) (HCC) Stable.  PAF (paroxysmal atrial fibrillation) (HCC) Stable.  Essential hypertension Stable. Has hx of orthostatic hypotension.  Long term current use of systemic steroids Pt remains  on cortef bid. Unclear why he is still on cortef.  Appears to have started in October/November 2023.   DVT prophylaxis: Eliquis Code Status: DNR/DNI(Do NOT Intubate). Verified with pt and MOST/DNR form Family Communication: no family at bedside  Disposition Plan: return to SNF  Consults called: none  Admission status: Observation, Med-Surg   Carollee Herter, DO [facility] 04/01/2023, 10:42 PM

## 2023-04-01 NOTE — ED Provider Notes (Signed)
[place] EMERGENCY DEPARTMENT AT [facility] Provider Note   CSN: [id number] Arrival date & time: 04/01/23  1641     History  Chief Complaint  Patient presents with   Urinary Retention    Peter Yang is a 67 y.o. male.  Who was sent in from [facility] for evaluation of discolored urine and hematuria.  He has a history of recent episode of urinary retention over the weekend had 2000 mL of urine in his bladder and suprapubic pain noted and had a Foley catheter placed by nursing staff.  He has had the Foley catheter in place since then with normal drainage however today the staff noted that his urine was full of purulence and blood and sent him here for further evaluation.  He has not had any previous evaluation of his urine.  He denies fevers or chills. HPI     Home Medications Prior to Admission medications   Medication Sig Start Date End Date Taking? Authorizing Provider  acetaminophen (TYLENOL) 325 MG tablet Take 2 tablets (650 mg total) by mouth every 6 (six) hours as needed for mild pain (or Fever >/= 101). 07/30/21   Sheikh, Omair Latif, DO  Amino Acids-Protein Hydrolys (PRO-STAT AWC) LIQD Take 30 mLs by mouth 2 (two) times daily.    [provider]  apixaban (ELIQUIS) 5 MG TABS tablet Take 1 tablet (5 mg total) by mouth 2 (two) times daily. 08/16/22 09/15/22  Levin Erp, MD  atorvastatin (LIPITOR) 80 MG tablet Take 80 mg by mouth daily. 03/17/21   [provider]  B Complex-C (B-COMPLEX WITH VITAMIN C) tablet Take 1 tablet by mouth daily. 10/24/22   Lanae Boast, MD  bisacodyl (DULCOLAX) 10 MG suppository Place 1 suppository (10 mg total) rectally daily as needed for moderate constipation. 10/24/22   Lanae Boast, MD  clopidogrel (PLAVIX) 75 MG tablet Take 1 tablet (75 mg total) by mouth daily. 08/16/22   Levin Erp, MD  cyanocobalamin 1000 MCG tablet Take 1 tablet (1,000 mcg total) by mouth daily. 10/24/22   Lanae Boast, MD   diclofenac Sodium (VOLTAREN) 1 % GEL Apply 2 g topically 2 (two) times daily as needed (pain). 07/20/21   [provider]  empagliflozin (JARDIANCE) 10 MG TABS tablet Take 10 mg by mouth daily at 12 noon.    [provider]  feeding supplement (ENSURE ENLIVE / ENSURE PLUS) LIQD Take 237 mLs by mouth 3 (three) times daily between meals. 10/24/22   Lanae Boast, MD  ferrous sulfate 325 (65 FE) MG tablet Take 1 tablet (325 mg total) by mouth daily with breakfast. 10/25/22   Lanae Boast, MD  furosemide (LASIX) 20 MG tablet Take 1 tablet (20 mg total) by mouth daily for 1 day. 10/24/22 10/25/22  Lanae Boast, MD  gabapentin (NEURONTIN) 300 MG capsule Take 300 mg by mouth at bedtime. 07/20/21   [provider]  hydrocortisone (CORTEF) 10 MG tablet Take 1 tablet (10 mg total) by mouth daily. 10/24/22   Lanae Boast, MD  hydrocortisone (CORTEF) 5 MG tablet Take 1 tablet (5 mg total) by mouth every evening. 10/24/22   Lanae Boast, MD  melatonin 5 MG TABS Take 1 tablet (5 mg total) by mouth daily at 6 PM. 10/24/22   Lanae Boast, MD  metFORMIN (GLUCOPHAGE) 500 MG tablet Take 1,000 mg by mouth 2 (two) times daily with a meal.    [provider]  midodrine (PROAMATINE) 5 MG tablet Take 1 tablet (5 mg  total) by mouth with breakfast, with lunch, and with evening meal. 10/24/22   Lanae Boast, MD  nystatin (MYCOSTATIN/NYSTOP) powder Apply topically 2 (two) times daily. 10/24/22   Lanae Boast, MD  oxyCODONE (OXY IR/ROXICODONE) 5 MG immediate release tablet Take 1 tablet (5 mg total) by mouth every 12 (twelve) hours as needed for up to 4 doses for moderate pain, severe pain or breakthrough pain. 10/24/22   Lanae Boast, MD  pantoprazole (PROTONIX) 40 MG tablet Take 1 tablet (40 mg total) by mouth daily. 06/30/22   Pokhrel, Rebekah Chesterfield, MD  polyethylene glycol (MIRALAX / GLYCOLAX) 17 g packet Take 17 g by mouth daily as needed for mild constipation. 10/24/22   Lanae Boast, MD  potassium chloride SA (KLOR-CON M) 20 MEQ tablet  Take 20 mEq by mouth daily.    [provider]  Propylene Glycol (SYSTANE BALANCE) 0.6 % SOLN Place 1 drop into both eyes daily as needed.    [provider]  saccharomyces boulardii (FLORASTOR) 250 MG capsule Take 250 mg by mouth 2 (two) times daily.    [provider]  senna-docusate (SENOKOT-S) 8.6-50 MG tablet Take 1 tablet by mouth 2 (two) times daily. 10/24/22   Lanae Boast, MD  tamsulosin (FLOMAX) 0.4 MG CAPS capsule Take 0.4 mg by mouth daily. 07/29/22   [provider]      Allergies    Patient has no known allergies.    Review of Systems   Review of Systems  Physical Exam Updated Vital Signs BP 102/62   Pulse 96   Temp 98.4 F (36.9 C)   Resp 17   Ht 6\' 4"  (1.93 m)   Wt 118.1 kg   SpO2 96%   BMI 31.69 kg/m  Physical Exam Vitals and nursing note reviewed.  Constitutional:      General: He is not in acute distress.    Appearance: He is well-developed. He is not diaphoretic.  HENT:     Head: Normocephalic and atraumatic.  Eyes:     General: No scleral icterus.    Conjunctiva/sclera: Conjunctivae normal.  Cardiovascular:     Rate and Rhythm: Normal rate and regular rhythm.     Heart sounds: Normal heart sounds.  Pulmonary:     Effort: Pulmonary effort is normal. No respiratory distress.     Breath sounds: Normal breath sounds.  Abdominal:     Palpations: Abdomen is soft.     Tenderness: There is no abdominal tenderness.  Genitourinary:    Comments: Indwelling Foley catheter in place with normal urinary output.  Urine appears full of sediment and is red-tinged with blood and no clots. Musculoskeletal:     Cervical back: Normal range of motion and neck supple.  Skin:    General: Skin is warm and dry.  Neurological:     Mental Status: He is alert.  Psychiatric:        Behavior: Behavior normal.     ED Results / Procedures / Treatments   Labs (all labs ordered are listed, but only abnormal results are displayed) Labs  Reviewed  URINE CULTURE  URINALYSIS, W/ REFLEX TO CULTURE (INFECTION SUSPECTED)  BASIC METABOLIC PANEL  CBC WITH DIFFERENTIAL/PLATELET    EKG None  Radiology No results found.  Procedures .Critical Care  Performed by: Arthor Captain, PA-C Authorized by: Arthor Captain, PA-C   Critical care provider statement:    Critical care time (minutes):  30   Critical care time was exclusive of:  Separately billable procedures and treating  other patients   Critical care was necessary to treat or prevent imminent or life-threatening deterioration of the following conditions: necrotozing fasciiits.   Critical care was time spent personally by me on the following activities:  Development of treatment plan with patient or surrogate, discussions with consultants, evaluation of patient's response to treatment, examination of patient, ordering and review of laboratory studies, ordering and review of radiographic studies, ordering and performing treatments and interventions, pulse oximetry, re-evaluation of patient's condition and review of old charts     Medications Ordered in ED Medications  cefTRIAXone (ROCEPHIN) 1 g in sodium chloride 0.9 % 100 mL IVPB (has no administration in time range)    ED Course/ Medical Decision Making/ A&P                             Medical Decision Making Amount and/or Complexity of Data Reviewed Labs: ordered. Radiology: ordered.  Risk Decision regarding hospitalization.   This patient presents to the ED for concern of anormal urine, this involves an extensive number of treatment options, and is a complaint that carries with it a high risk of complications and morbidity.  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), Coronary artery disease involving native coronary  artery of native heart without angina pectoris (06/25/2022), Diabetic polyneuropathy associated with type 2 diabetes mellitus (HCC) (07/28/2021), Essential hypertension (01/25/2016), Hematuria and +fecal occult  (06/25/2022), History of diabetic ulcer of foot (12/31/2019), History of non-ST elevation myocardial infarction (NSTEMI) (06/15/2022), right coronary artery stent placement (02/11/2022), Hyperlipidemia, Ischemic cardiomyopathy, 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), Large bowel perforation (HCC) (09/27/2015), Malignant neoplasm of prostate (HCC) (10/22/2019), Myopia with astigmatism and presbyopia, bilateral (06/15/2018), Nontraumatic complete tear of left rotator cuff (09/20/2019), 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   Social Determinants of Health:   Bedbound, lives in SNF  Additional history:   Review of EMR, outside records  Lab Tests:  I Ordered, and personally interpreted labs.  The pertinent results include: White count 12.7, stable anemia, no evidence of acute uropathy   Cardiac Monitoring/ECG:  The patient was maintained on a cardiac monitor.  I personally viewed and interpreted the cardiac monitored which showed an underlying rhythm of: nsr  Medicines ordered and prescription drug management:  I ordered medication including  Medications  oxyCODONE (Oxy IR/ROXICODONE) immediate release tablet 5 mg (has no administration in time range)  atorvastatin (LIPITOR) tablet 80 mg (80 mg Oral Given 04/02/23 0917)  metoprolol succinate (TOPROL-XL) 24 hr tablet 12.5 mg (12.5 mg Oral Given 04/02/23 0916)  lisinopril (ZESTRIL) tablet 2.5 mg (2.5 mg Oral  Not Given 04/02/23 1016)  gabapentin (NEURONTIN) capsule 300 mg (300 mg Oral Not Given 04/02/23 0238)  hydrocortisone (CORTEF) tablet 10 mg (10 mg Oral Given 04/02/23 0916)    And  hydrocortisone (CORTEF) tablet 5 mg (5 mg Oral Not Given 04/02/23 0239)  insulin glargine-yfgn (SEMGLEE) injection 15 Units (15 Units Subcutaneous Patient Refused/Not Given 04/02/23 0350)  melatonin tablet 10 mg (has no administration in time range)  pantoprazole (PROTONIX) EC tablet 40 mg (40 mg Oral Given  04/02/23 0917)  acetaminophen (TYLENOL) tablet 650 mg (has no administration in time range)    Or  acetaminophen (TYLENOL) suppository 650 mg (has no administration in time range)  ondansetron (ZOFRAN) tablet 4 mg (has no administration in time range)    Or  ondansetron (ZOFRAN) injection 4 mg (has no administration in time range)  insulin aspart (novoLOG) injection 0-15 Units (2 Units Subcutaneous Given 04/02/23 0635)  insulin aspart (novoLOG) injection 0-5 Units ( Subcutaneous Not Given 04/02/23 0239)  meropenem (MERREM) 1 g in sodium chloride 0.9 % 100 mL IVPB (1 g Intravenous New Bag/Given 04/02/23 0636)  0.9 %  sodium chloride infusion ( Intravenous New Bag/Given 04/02/23 0225)  clindamycin (CLEOCIN) IVPB 900 mg (900 mg Intravenous New Bag/Given 04/02/23 0920)  vancomycin (VANCOCIN) IVPB 1000 mg/200 mL premix (has no administration in time range)  morphine (PF) 4 MG/ML injection 4 mg (4 mg Intravenous Given 04/02/23 0500)  prothrombin complex conc human (KCENTRA) IVPB 4,848 Units (has no administration in time range)  cefTRIAXone (ROCEPHIN) 1 g in sodium chloride 0.9 % 100 mL IVPB (0 g Intravenous Stopped 04/01/23 1902)  sodium chloride 0.9 % bolus 1,000 mL (0 mLs Intravenous Stopped 04/01/23 2334)  vancomycin (VANCOREADY) IVPB 2000 mg/400 mL (2,000 mg Intravenous New Bag/Given 04/02/23 0329)   for uti Reevaluation of the patient after these medicines showed that the patient improved I have reviewed the patients home  medicines and have made adjustments as needed  Test Considered:    Critical Interventions:  fluids, abx  Consultations Obtained: Dr. Imogene Burn for admission  Problem List / ED Course:  No diagnosis found.  MDM: patient with UTI . Covered in the ED with rocephin. Review of EMR shows previous ESBL infection requiring coverage with Carbapenem. Discussed with Dr. Imogene Burn who asks for ct renal stone study.   23:53- received call from Dr. 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 ABX broadened with vanc and clindamycin. Dr. Imogene Burn updated and will consult Urology- pt already on the floor.   Dispostion:  After consideration of the diagnostic results and the patients response to treatment, I feel that the patent would benefit from admission         Final Clinical Impression(s) / ED Diagnoses Final diagnoses:  None    Rx / DC Orders ED Discharge Orders     None         Arthor Captain, PA-C 04/02/23 1119    Lorre Nick, MD 04/06/23 [phone]

## 2023-04-01 NOTE — Assessment & Plan Note (Signed)
Due to foley and UTI. May need to hold Eliquis if large amount of bleeding.

## 2023-04-01 NOTE — ED Triage Notes (Signed)
Pt BIB EMS from [place] for possible urinary rentention. Had catheter placed 2 days ago and started having penile pain today with bleeding and pus in urine. Red cloudy urine present in drainage bag.

## 2023-04-01 NOTE — Assessment & Plan Note (Addendum)
Pt remains on cortef bid. Unclear why he is still on cortef.  Appears to have started in October/November 2023.

## 2023-04-01 NOTE — Assessment & Plan Note (Signed)
Continue with SSI and lantus.

## 2023-04-01 NOTE — Assessment & Plan Note (Signed)
Stable. Has hx of orthostatic hypotension.

## 2023-04-01 NOTE — ED Notes (Signed)
ED TO INPATIENT HANDOFF REPORT  ED Nurse Name and Phone #:  Gillis Ends [phone]  S Name/Age/Gender Peter Yang 67 y.o. male Room/Bed: 040C/040C  Code Status   Code Status: DNR  Home/SNF/Other Skilled nursing facility Patient oriented to: self, place, time, and situation Is this baseline? Yes   Triage Complete: Triage complete  Chief Complaint UTI (urinary tract infection) due to urinary indwelling catheter (HCC) [R60.454U, N39.0]  Triage Note Pt BIB EMS from [facility] for possible urinary rentention. Had catheter placed 2 days ago and started having penile pain today with bleeding and pus in urine. Red cloudy urine present in drainage bag.    Allergies No Known Allergies  Level of Care/Admitting Diagnosis ED Disposition     ED Disposition  Admit   Condition  --   Comment  Hospital Area: [facility] [[id number]]  Level of Care: Med-Surg [16]  May place patient in observation at [facility] or [facility] if equivalent level of care is available:: No  Covid Evaluation: Asymptomatic - no recent exposure (last 10 days) testing not required  Diagnosis: UTI (urinary tract infection) due to urinary indwelling catheter [facility]) [[id number]]  Admitting Physician: Imogene Burn, ERIC [[id number]]  Attending Physician: Imogene Burn, ERIC [[id number]]          B Medical/Surgery History 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   Coronary artery disease involving native coronary artery of native heart without angina pectoris 06/25/2022   Diabetic polyneuropathy associated with type 2 diabetes mellitus (HCC) 07/28/2021   Essential hypertension 01/25/2016   Hematuria and +fecal occult  06/25/2022   History of diabetic ulcer of foot 12/31/2019   History of non-ST elevation myocardial infarction (NSTEMI) 06/15/2022   Hx of right coronary artery stent  placement 02/11/2022   Hyperlipidemia    Ischemic cardiomyopathy    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 Overview:  Added automatically from request for surgery [id number] Formatting of this note might be different from the original. Added automatically from request for surgery [id number]   Large bowel perforation (HCC) 09/27/2015   Malignant neoplasm of prostate (HCC) 10/22/2019   Myopia with astigmatism and presbyopia, bilateral 06/15/2018   Last Assessment & Plan:  Formatting of this note might be different from the original. - Wrx printed Formatting of this note might be different from the original. 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 - Stable, monitor Formatting of this note might be different from the original. Last Assessment & Plan:  - Stable, monitor   Retroperitoneal abscess (HCC) 10/02/2015   Status post corneal transplant 10/07/2016   Formatting of this note might be different from the original. Overview:  Added automatically from request for surgery [id number] Formatting of this note might be different from the original. Added automatically from request for surgery [id number]  Last Assessment & Plan:  Formatting of this note might be different from the original. - OD, 2/2 KCN - Stable, no NVK / rejection - Continue  off steroids - Mo   Thoracic ascending aortic aneurysm (HCC)    TIA (transient ischemic attack) 2012   Type 2 diabetes mellitus (HCC)    Past Surgical History:  Procedure Laterality Date   BELOW KNEE LEG AMPUTATION Left 2022   CORONARY ANGIOPLASTY WITH STENT PLACEMENT  01/2022   DES RCA    LEFT HEART CATH AND CORONARY ANGIOGRAPHY N/A 06/17/2022   Procedure: LEFT HEART CATH AND CORONARY ANGIOGRAPHY;  Surgeon: Yvonne Kendall, MD;  Location: [facility] INVASIVE CV LAB;  Service: Cardiovascular;  Laterality: N/A;   ROTATOR CUFF REPAIR Left      A IV Location/Drains/Wounds Patient Lines/Drains/Airways Status     Active Line/Drains/Airways     Name Placement date Placement time Site Days   Peripheral IV 04/01/23 22 G Left;Posterior Hand 04/01/23  1828  Hand  less than 1   External Urinary Catheter 10/06/22  0556  --  177   Pressure Injury 08/03/22 Buttocks Mid Stage 2 -  Partial thickness loss of dermis presenting as a shallow open injury with a red, pink wound bed without slough. area of red blanchable and non blanchable, and areas with skin sloughed off 08/03/22  1513  -- 241   Wound / Incision (Open or Dehisced) 08/03/22 Irritant Dermatitis (Moisture Associated Skin Damage) Abdomen Right;Left;Lower MASD in skin fold under abdomen 08/03/22  1514  Abdomen  241            Intake/Output Last 24 hours No intake or output data in the 24 hours ending 04/01/23 2326  Labs/Imaging Results for orders placed or performed during the hospital encounter of 04/01/23 (from the past 48 hour(s))  Urinalysis, w/ Reflex to Culture (Infection Suspected) -Urine, Catheterized     Status: Abnormal   Collection Time: 04/01/23  5:46 PM  Result Value Ref Range   Specimen Source URINE, CLEAN CATCH    Color, Urine RED (A) YELLOW    Comment: BIOCHEMICALS MAY BE AFFECTED BY COLOR   APPearance TURBID (A) CLEAR   Specific Gravity, Urine  1.005 - 1.030    TEST NOT REPORTED DUE TO COLOR INTERFERENCE OF URINE PIGMENT   pH  5.0 - 8.0    TEST NOT REPORTED DUE TO COLOR INTERFERENCE OF URINE PIGMENT   Glucose, UA (A) NEGATIVE mg/dL    TEST NOT REPORTED DUE TO COLOR INTERFERENCE OF URINE PIGMENT   Hgb urine dipstick (A) NEGATIVE    TEST NOT REPORTED DUE TO COLOR INTERFERENCE OF URINE PIGMENT   Bilirubin Urine  (A) NEGATIVE    TEST NOT REPORTED DUE TO COLOR INTERFERENCE OF URINE PIGMENT   Ketones, ur (A) NEGATIVE mg/dL    TEST NOT REPORTED DUE TO COLOR INTERFERENCE OF URINE PIGMENT   Protein, ur (A) NEGATIVE mg/dL    TEST NOT REPORTED DUE TO COLOR INTERFERENCE OF URINE PIGMENT   Nitrite (A) NEGATIVE    TEST NOT REPORTED DUE TO COLOR INTERFERENCE OF URINE PIGMENT   Leukocytes,Ua (A) NEGATIVE    TEST NOT REPORTED DUE TO COLOR INTERFERENCE OF URINE PIGMENT   Squamous Epithelial / HPF 0-5 0 - 5 /HPF   WBC, UA 21-50 0 - 5 WBC/hpf    Comment: Reflex urine culture not performed if WBC <=10, OR if Squamous epithelial cells >5. If Squamous epithelial cells >5, suggest recollection.   RBC / HPF >50 0 - 5 RBC/hpf   Bacteria, UA NONE SEEN NONE SEEN   WBC Clumps PRESENT     Comment: Performed at [facility],  [address], [place], [place] [address]  Basic metabolic panel     Status: Abnormal   Collection Time: 04/01/23  6:17 PM  Result Value Ref Range   Sodium 131 (L) 135 - 145 mmol/L   Potassium 4.1 3.5 - 5.1 mmol/L   Chloride 96 (L) 98 - 111 mmol/L   CO2 24 22 - 32 mmol/L   Glucose, Bld 191 (H) 70 - 99 mg/dL    Comment: Glucose reference range applies only to samples taken after fasting for at least 8 hours.   BUN 32 (H) 8 - 23 mg/dL   Creatinine, Ser 1.91 0.61 - 1.24 mg/dL   Calcium 8.4 (L) 8.9 - 10.3 mg/dL   GFR, Estimated >47 >82 mL/min    Comment: (NOTE) Calculated using the CKD-EPI Creatinine Equation (2021)    Anion gap 11 5 - 15    Comment: Performed at [facility], [address], [place], [place] [address]  CBC with Differential     Status: Abnormal   Collection Time: 04/01/23  6:17 PM  Result Value Ref Range   WBC 12.7 (H) 4.0 - 10.5 K/uL   RBC 4.09 (L) 4.22 - 5.81 MIL/uL   Hemoglobin 9.8 (L) 13.0 - 17.0 g/dL   HCT 30.8 (L) 65.7 - 84.6 %   MCV 80.7 80.0 - 100.0 fL   MCH 24.0 (L) 26.0 - 34.0 pg   MCHC 29.7 (L) 30.0 - 36.0 g/dL   RDW 96.2 (H) 95.2 - 84.1 %    Platelets 277 150 - 400 K/uL   nRBC 0.0 0.0 - 0.2 %   Neutrophils Relative % 80 %   Neutro Abs 10.1 (H) 1.7 - 7.7 K/uL   Lymphocytes Relative 7 %   Lymphs Abs 0.9 0.7 - 4.0 K/uL   Monocytes Relative 8 %   Monocytes Absolute 1.0 0.1 - 1.0 K/uL   Eosinophils Relative 4 %   Eosinophils Absolute 0.5 0.0 - 0.5 K/uL   Basophils Relative 0 %   Basophils Absolute 0.1 0.0 - 0.1 K/uL   Immature Granulocytes 1 %   Abs Immature Granulocytes 0.09 (H) 0.00 - 0.07 K/uL    Comment: Performed at [facility], [address] [place], [place] [address]   No results found.  Pending Labs Unresulted Labs (From admission, onward)     Start     Ordered   04/01/23 1746  Urine Culture  Once,   URGENT       Question:  Indication  Answer:  Dysuria   04/01/23 1746   Signed and Held  Comprehensive metabolic panel  Tomorrow morning,   R        Signed and Held   Signed and Held  CBC with Differential/Platelet  Tomorrow morning,   R        Signed and Held   Signed and Held  Hemoglobin A1c  Once,   R       Comments: To assess prior glycemic control    Signed and Held            Vitals/Pain Today's Vitals   04/01/23 2030 04/01/23 2032 04/01/23 2033 04/01/23 2136  BP:   (!) 99/53   Pulse: 86 87 88   Resp: 19 17 18    Temp:    98.5 F (36.9 C)  TempSrc:    Oral  SpO2: 97% 97% 99%   Weight:      Height:      PainSc:  Isolation Precautions No active isolations  Medications Medications  meropenem (MERREM) 2 g in sodium chloride 0.9 % 100 mL IVPB (has no administration in time range)  cefTRIAXone (ROCEPHIN) 1 g in sodium chloride 0.9 % 100 mL IVPB (0 g Intravenous Stopped 04/01/23 1902)  sodium chloride 0.9 % bolus 1,000 mL (1,000 mLs Intravenous New Bag/Given 04/01/23 2101)    Mobility non-ambulatory     Focused Assessments    R Recommendations: See Admitting Provider Note  Report given to:   Additional Notes:

## 2023-04-02 ENCOUNTER — Inpatient Hospital Stay (HOSPITAL_COMMUNITY): Payer: Medicare Other | Admitting: Anesthesiology

## 2023-04-02 ENCOUNTER — Encounter (HOSPITAL_COMMUNITY): Payer: Self-pay | Admitting: Family Medicine

## 2023-04-02 ENCOUNTER — Other Ambulatory Visit: Payer: Self-pay

## 2023-04-02 ENCOUNTER — Encounter (HOSPITAL_COMMUNITY)
Admission: EM | Disposition: A | Discharge status: Skilled Nursing Facility | Payer: Self-pay | Source: Home / Self Care | Attending: Family Medicine

## 2023-04-02 DIAGNOSIS — M7989 Other specified soft tissue disorders: Secondary | ICD-10-CM | POA: Diagnosis not present

## 2023-04-02 DIAGNOSIS — M009 Pyogenic arthritis, unspecified: Secondary | ICD-10-CM | POA: Diagnosis present

## 2023-04-02 DIAGNOSIS — Z515 Encounter for palliative care: Secondary | ICD-10-CM | POA: Diagnosis not present

## 2023-04-02 DIAGNOSIS — E1151 Type 2 diabetes mellitus with diabetic peripheral angiopathy without gangrene: Secondary | ICD-10-CM | POA: Diagnosis not present

## 2023-04-02 DIAGNOSIS — T8789 Other complications of amputation stump: Secondary | ICD-10-CM | POA: Diagnosis not present

## 2023-04-02 DIAGNOSIS — E1149 Type 2 diabetes mellitus with other diabetic neurological complication: Secondary | ICD-10-CM

## 2023-04-02 DIAGNOSIS — N138 Other obstructive and reflux uropathy: Secondary | ICD-10-CM | POA: Diagnosis present

## 2023-04-02 DIAGNOSIS — I5022 Chronic systolic (congestive) heart failure: Secondary | ICD-10-CM | POA: Diagnosis not present

## 2023-04-02 DIAGNOSIS — I10 Essential (primary) hypertension: Secondary | ICD-10-CM | POA: Diagnosis not present

## 2023-04-02 DIAGNOSIS — L089 Local infection of the skin and subcutaneous tissue, unspecified: Secondary | ICD-10-CM | POA: Diagnosis present

## 2023-04-02 DIAGNOSIS — D62 Acute posthemorrhagic anemia: Secondary | ICD-10-CM | POA: Diagnosis not present

## 2023-04-02 DIAGNOSIS — T83511A Infection and inflammatory reaction due to indwelling urethral catheter, initial encounter: Secondary | ICD-10-CM | POA: Diagnosis not present

## 2023-04-02 DIAGNOSIS — I25118 Atherosclerotic heart disease of native coronary artery with other forms of angina pectoris: Secondary | ICD-10-CM | POA: Diagnosis not present

## 2023-04-02 DIAGNOSIS — I5043 Acute on chronic combined systolic (congestive) and diastolic (congestive) heart failure: Secondary | ICD-10-CM | POA: Diagnosis not present

## 2023-04-02 DIAGNOSIS — C61 Malignant neoplasm of prostate: Secondary | ICD-10-CM | POA: Diagnosis present

## 2023-04-02 DIAGNOSIS — I5030 Unspecified diastolic (congestive) heart failure: Secondary | ICD-10-CM | POA: Diagnosis not present

## 2023-04-02 DIAGNOSIS — Y846 Urinary catheterization as the cause of abnormal reaction of the patient, or of later complication, without mention of misadventure at the time of the procedure: Secondary | ICD-10-CM | POA: Diagnosis present

## 2023-04-02 DIAGNOSIS — E1152 Type 2 diabetes mellitus with diabetic peripheral angiopathy with gangrene: Secondary | ICD-10-CM | POA: Diagnosis not present

## 2023-04-02 DIAGNOSIS — M00871 Arthritis due to other bacteria, right ankle and foot: Secondary | ICD-10-CM | POA: Diagnosis not present

## 2023-04-02 DIAGNOSIS — R1030 Lower abdominal pain, unspecified: Secondary | ICD-10-CM | POA: Diagnosis not present

## 2023-04-02 DIAGNOSIS — I2510 Atherosclerotic heart disease of native coronary artery without angina pectoris: Secondary | ICD-10-CM

## 2023-04-02 DIAGNOSIS — Z955 Presence of coronary angioplasty implant and graft: Secondary | ICD-10-CM | POA: Diagnosis not present

## 2023-04-02 DIAGNOSIS — J9811 Atelectasis: Secondary | ICD-10-CM | POA: Diagnosis not present

## 2023-04-02 DIAGNOSIS — I7121 Aneurysm of the ascending aorta, without rupture: Secondary | ICD-10-CM | POA: Diagnosis present

## 2023-04-02 DIAGNOSIS — R531 Weakness: Secondary | ICD-10-CM | POA: Diagnosis not present

## 2023-04-02 DIAGNOSIS — I5042 Chronic combined systolic (congestive) and diastolic (congestive) heart failure: Secondary | ICD-10-CM | POA: Diagnosis not present

## 2023-04-02 DIAGNOSIS — I051 Rheumatic mitral insufficiency: Secondary | ICD-10-CM | POA: Diagnosis present

## 2023-04-02 DIAGNOSIS — I951 Orthostatic hypotension: Secondary | ICD-10-CM | POA: Diagnosis present

## 2023-04-02 DIAGNOSIS — I480 Paroxysmal atrial fibrillation: Secondary | ICD-10-CM | POA: Diagnosis present

## 2023-04-02 DIAGNOSIS — N493 Fournier gangrene: Secondary | ICD-10-CM | POA: Diagnosis not present

## 2023-04-02 DIAGNOSIS — Y835 Amputation of limb(s) as the cause of abnormal reaction of the patient, or of later complication, without mention of misadventure at the time of the procedure: Secondary | ICD-10-CM | POA: Diagnosis present

## 2023-04-02 DIAGNOSIS — I6601 Occlusion and stenosis of right middle cerebral artery: Secondary | ICD-10-CM | POA: Diagnosis present

## 2023-04-02 DIAGNOSIS — Z8619 Personal history of other infectious and parasitic diseases: Secondary | ICD-10-CM | POA: Diagnosis not present

## 2023-04-02 DIAGNOSIS — E2740 Unspecified adrenocortical insufficiency: Secondary | ICD-10-CM | POA: Diagnosis present

## 2023-04-02 DIAGNOSIS — A419 Sepsis, unspecified organism: Secondary | ICD-10-CM | POA: Diagnosis present

## 2023-04-02 DIAGNOSIS — Z6831 Body mass index (BMI) 31.0-31.9, adult: Secondary | ICD-10-CM | POA: Diagnosis not present

## 2023-04-02 DIAGNOSIS — N3001 Acute cystitis with hematuria: Secondary | ICD-10-CM | POA: Diagnosis not present

## 2023-04-02 DIAGNOSIS — E441 Mild protein-calorie malnutrition: Secondary | ICD-10-CM | POA: Diagnosis present

## 2023-04-02 DIAGNOSIS — N492 Inflammatory disorders of scrotum: Secondary | ICD-10-CM | POA: Diagnosis not present

## 2023-04-02 DIAGNOSIS — Z66 Do not resuscitate: Secondary | ICD-10-CM | POA: Diagnosis not present

## 2023-04-02 DIAGNOSIS — I110 Hypertensive heart disease with heart failure: Secondary | ICD-10-CM | POA: Diagnosis present

## 2023-04-02 DIAGNOSIS — N390 Urinary tract infection, site not specified: Secondary | ICD-10-CM | POA: Diagnosis not present

## 2023-04-02 DIAGNOSIS — R339 Retention of urine, unspecified: Secondary | ICD-10-CM | POA: Diagnosis present

## 2023-04-02 DIAGNOSIS — E871 Hypo-osmolality and hyponatremia: Secondary | ICD-10-CM | POA: Diagnosis not present

## 2023-04-02 DIAGNOSIS — Z7189 Other specified counseling: Secondary | ICD-10-CM | POA: Diagnosis not present

## 2023-04-02 DIAGNOSIS — Z89421 Acquired absence of other right toe(s): Secondary | ICD-10-CM | POA: Diagnosis not present

## 2023-04-02 DIAGNOSIS — I9589 Other hypotension: Secondary | ICD-10-CM | POA: Diagnosis present

## 2023-04-02 DIAGNOSIS — Z89512 Acquired absence of left leg below knee: Secondary | ICD-10-CM | POA: Diagnosis not present

## 2023-04-02 DIAGNOSIS — Z7952 Long term (current) use of systemic steroids: Secondary | ICD-10-CM | POA: Diagnosis not present

## 2023-04-02 DIAGNOSIS — L89153 Pressure ulcer of sacral region, stage 3: Secondary | ICD-10-CM | POA: Diagnosis present

## 2023-04-02 HISTORY — PX: IRRIGATION AND DEBRIDEMENT ABSCESS: SHX5252

## 2023-04-02 LAB — CBC WITH DIFFERENTIAL/PLATELET
Abs Immature Granulocytes: 0.05 10*3/uL (ref 0.00–0.07)
Basophils Absolute: 0.1 10*3/uL (ref 0.0–0.1)
Basophils Relative: 1 %
Eosinophils Absolute: 0.6 10*3/uL — ABNORMAL HIGH (ref 0.0–0.5)
Eosinophils Relative: 5 %
HCT: 31 % — ABNORMAL LOW (ref 39.0–52.0)
Hemoglobin: 9.5 g/dL — ABNORMAL LOW (ref 13.0–17.0)
Immature Granulocytes: 0 %
Lymphocytes Relative: 10 %
Lymphs Abs: 1.2 10*3/uL (ref 0.7–4.0)
MCH: 23.8 pg — ABNORMAL LOW (ref 26.0–34.0)
MCHC: 30.6 g/dL (ref 30.0–36.0)
MCV: 77.5 fL — ABNORMAL LOW (ref 80.0–100.0)
Monocytes Absolute: 1 10*3/uL (ref 0.1–1.0)
Monocytes Relative: 9 %
Neutro Abs: 8.8 10*3/uL — ABNORMAL HIGH (ref 1.7–7.7)
Neutrophils Relative %: 75 %
Platelets: 271 10*3/uL (ref 150–400)
RBC: 4 MIL/uL — ABNORMAL LOW (ref 4.22–5.81)
RDW: 18.9 % — ABNORMAL HIGH (ref 11.5–15.5)
WBC: 11.6 10*3/uL — ABNORMAL HIGH (ref 4.0–10.5)
nRBC: 0 % (ref 0.0–0.2)

## 2023-04-02 LAB — GLUCOSE, CAPILLARY
Glucose-Capillary: 158 mg/dL — ABNORMAL HIGH (ref 70–99)
Glucose-Capillary: 143 mg/dL — ABNORMAL HIGH (ref 70–99)
Glucose-Capillary: 128 mg/dL — ABNORMAL HIGH (ref 70–99)
Glucose-Capillary: 133 mg/dL — ABNORMAL HIGH (ref 70–99)
Glucose-Capillary: 158 mg/dL — ABNORMAL HIGH (ref 70–99)
Glucose-Capillary: 184 mg/dL — ABNORMAL HIGH (ref 70–99)

## 2023-04-02 LAB — NO BLOOD PRODUCTS

## 2023-04-02 LAB — URINE CULTURE: Culture: NO GROWTH

## 2023-04-02 LAB — MRSA NEXT GEN BY PCR, NASAL: MRSA by PCR Next Gen: DETECTED — AB

## 2023-04-02 LAB — COMPREHENSIVE METABOLIC PANEL
ALT: 18 U/L (ref 0–44)
AST: 21 U/L (ref 15–41)
Albumin: 1.8 g/dL — ABNORMAL LOW (ref 3.5–5.0)
Alkaline Phosphatase: 78 U/L (ref 38–126)
Anion gap: 10 (ref 5–15)
BUN: 30 mg/dL — ABNORMAL HIGH (ref 8–23)
CO2: 23 mmol/L (ref 22–32)
Calcium: 8.1 mg/dL — ABNORMAL LOW (ref 8.9–10.3)
Chloride: 98 mmol/L (ref 98–111)
Creatinine, Ser: 1.01 mg/dL (ref 0.61–1.24)
GFR, Estimated: >60 mL/min (ref >60)
Glucose, Bld: 174 mg/dL — ABNORMAL HIGH (ref 70–99)
Potassium: 3.7 mmol/L (ref 3.5–5.1)
Sodium: 131 mmol/L — ABNORMAL LOW (ref 135–145)
Total Bilirubin: 0.4 mg/dL (ref 0.3–1.2)
Total Protein: 5.5 g/dL — ABNORMAL LOW (ref 6.5–8.1)

## 2023-04-02 LAB — HEMOGLOBIN A1C
Hgb A1c MFr Bld: 7 % — ABNORMAL HIGH (ref 4.8–5.6)
Mean Plasma Glucose: 154.2 mg/dL

## 2023-04-02 SURGERY — IRRIGATION AND DEBRIDEMENT ABSCESS
Anesthesia: General | Site: Perineum

## 2023-04-02 MED ORDER — LIDOCAINE 2% (20 MG/ML) 5 ML SYRINGE
INTRAMUSCULAR | Status: DC | PRN
  Administered 2023-04-02: 60 mg via INTRAVENOUS

## 2023-04-02 MED ORDER — FENTANYL CITRATE (PF) 250 MCG/5ML IJ SOLN
INTRAMUSCULAR | Status: DC | PRN
  Administered 2023-04-02: 100 ug via INTRAVENOUS
  Administered 2023-04-02: 50 ug via INTRAVENOUS

## 2023-04-02 MED ORDER — ROCURONIUM BROMIDE 10 MG/ML (PF) SYRINGE
PREFILLED_SYRINGE | INTRAVENOUS | Status: DC | PRN
  Administered 2023-04-02: 30 mg via INTRAVENOUS
  Administered 2023-04-02: 20 mg via INTRAVENOUS

## 2023-04-02 MED ORDER — OXYCODONE HCL 5 MG PO TABS: 5.0000 mg | ORAL_TABLET | Freq: Once | ORAL | Status: DC | PRN

## 2023-04-02 MED ORDER — EPHEDRINE SULFATE-NACL 50-0.9 MG/10ML-% IV SOSY
PREFILLED_SYRINGE | INTRAVENOUS | Status: DC | PRN
  Administered 2023-04-02: 5 mg via INTRAVENOUS

## 2023-04-02 MED ORDER — ATORVASTATIN CALCIUM 80 MG PO TABS
80.0000 mg | ORAL_TABLET | Freq: Every day | ORAL | Status: DC
  Administered 2023-04-02 – 2023-04-17 (×16): 80 mg via ORAL
  Filled 2023-04-02 (×16): qty 1

## 2023-04-02 MED ORDER — POLYETHYLENE GLYCOL 3350 17 G PO PACK
17.0000 g | PACK | Freq: Two times a day (BID) | ORAL | Status: DC
  Administered 2023-04-05 – 2023-04-15 (×9): 17 g via ORAL
  Filled 2023-04-02 (×23): qty 1

## 2023-04-02 MED ORDER — CHLORHEXIDINE GLUCONATE 0.12 % MT SOLN: 15.0000 mL | Freq: Once | OROMUCOSAL | Status: AC

## 2023-04-02 MED ORDER — OXYCODONE HCL 5 MG/5ML PO SOLN: 5.0000 mg | Freq: Once | ORAL | Status: DC | PRN

## 2023-04-02 MED ORDER — ONDANSETRON HCL 4 MG/2ML IJ SOLN
INTRAMUSCULAR | Status: DC | PRN
  Administered 2023-04-02: 4 mg via INTRAVENOUS

## 2023-04-02 MED ORDER — OXYCODONE HCL 5 MG PO TABS: 5.0000 mg | ORAL_TABLET | Freq: Two times a day (BID) | ORAL | Status: DC | PRN

## 2023-04-02 MED ORDER — FENTANYL CITRATE (PF) 100 MCG/2ML IJ SOLN
25.0000 ug | INTRAMUSCULAR | Status: DC | PRN
  Administered 2023-04-02 (×2): 50 ug via INTRAVENOUS

## 2023-04-02 MED ORDER — HYDROMORPHONE HCL 1 MG/ML IJ SOLN: 1.0000 mg | INTRAMUSCULAR | Status: DC | PRN

## 2023-04-02 MED ORDER — HYDROCORTISONE 5 MG PO TABS
5.0000 mg | ORAL_TABLET | Freq: Every day | ORAL | Status: DC
  Filled 2023-04-02 (×2): qty 1

## 2023-04-02 MED ORDER — ONDANSETRON HCL 4 MG PO TABS: 4.0000 mg | ORAL_TABLET | Freq: Four times a day (QID) | ORAL | Status: DC | PRN

## 2023-04-02 MED ORDER — FENTANYL CITRATE (PF) 100 MCG/2ML IJ SOLN
INTRAMUSCULAR | Status: AC
  Filled 2023-04-02: qty 2

## 2023-04-02 MED ORDER — SODIUM CHLORIDE 0.9 % IV SOLN: INTRAVENOUS | Status: DC | PRN

## 2023-04-02 MED ORDER — ORAL CARE MOUTH RINSE: 15.0000 mL | Freq: Once | OROMUCOSAL | Status: AC

## 2023-04-02 MED ORDER — PANTOPRAZOLE SODIUM 40 MG PO TBEC
40.0000 mg | DELAYED_RELEASE_TABLET | Freq: Every day | ORAL | Status: DC
  Administered 2023-04-02 – 2023-04-18 (×17): 40 mg via ORAL
  Filled 2023-04-02 (×17): qty 1

## 2023-04-02 MED ORDER — ONDANSETRON HCL 4 MG/2ML IJ SOLN
INTRAMUSCULAR | Status: AC
  Filled 2023-04-02: qty 4

## 2023-04-02 MED ORDER — LACTATED RINGERS IV SOLN: INTRAVENOUS | Status: DC

## 2023-04-02 MED ORDER — EPHEDRINE 5 MG/ML INJ
INTRAVENOUS | Status: AC
  Filled 2023-04-02: qty 5

## 2023-04-02 MED ORDER — FENTANYL CITRATE (PF) 250 MCG/5ML IJ SOLN
INTRAMUSCULAR | Status: AC
  Filled 2023-04-02: qty 5

## 2023-04-02 MED ORDER — ACETAMINOPHEN 650 MG RE SUPP: 650.0000 mg | Freq: Four times a day (QID) | RECTAL | Status: DC | PRN

## 2023-04-02 MED ORDER — PROTHROMBIN COMPLEX CONC HUMAN 500 UNITS IV KIT
4848.0000 [IU] | PACK | Freq: Once | Status: AC
  Administered 2023-04-02: 4848 [IU] via INTRAVENOUS
  Filled 2023-04-02: qty 4848

## 2023-04-02 MED ORDER — MIDAZOLAM HCL 2 MG/2ML IJ SOLN
INTRAMUSCULAR | Status: DC | PRN
  Administered 2023-04-02: 1 mg via INTRAVENOUS

## 2023-04-02 MED ORDER — INSULIN ASPART 100 UNIT/ML IJ SOLN
0.0000 [IU] | Freq: Every day | INTRAMUSCULAR | Status: DC
  Administered 2023-04-03: 3 [IU] via SUBCUTANEOUS
  Administered 2023-04-04: 2 [IU] via SUBCUTANEOUS

## 2023-04-02 MED ORDER — VANCOMYCIN HCL 2000 MG/400ML IV SOLN
2000.0000 mg | Freq: Once | INTRAVENOUS | Status: AC
  Administered 2023-04-02: 2000 mg via INTRAVENOUS
  Filled 2023-04-02: qty 400

## 2023-04-02 MED ORDER — VASOPRESSIN 20 UNIT/ML IV SOLN
INTRAVENOUS | Status: AC
  Filled 2023-04-02: qty 1

## 2023-04-02 MED ORDER — CHLORHEXIDINE GLUCONATE 0.12 % MT SOLN
OROMUCOSAL | Status: AC
  Administered 2023-04-02: 15 mL via OROMUCOSAL
  Filled 2023-04-02: qty 15

## 2023-04-02 MED ORDER — MIDAZOLAM HCL 2 MG/2ML IJ SOLN
INTRAMUSCULAR | Status: AC
  Filled 2023-04-02: qty 2

## 2023-04-02 MED ORDER — SODIUM CHLORIDE 0.9 % IV SOLN: INTRAVENOUS | Status: DC

## 2023-04-02 MED ORDER — PHENYLEPHRINE 80 MCG/ML (10ML) SYRINGE FOR IV PUSH (FOR BLOOD PRESSURE SUPPORT)
PREFILLED_SYRINGE | INTRAVENOUS | Status: DC | PRN
  Administered 2023-04-02 (×3): 160 ug via INTRAVENOUS

## 2023-04-02 MED ORDER — INSULIN GLARGINE-YFGN 100 UNIT/ML ~~LOC~~ SOLN
15.0000 [IU] | Freq: Every day | SUBCUTANEOUS | Status: DC
  Administered 2023-04-02 – 2023-04-06 (×5): 15 [IU] via SUBCUTANEOUS
  Filled 2023-04-02 (×10): qty 0.15

## 2023-04-02 MED ORDER — MORPHINE SULFATE (PF) 4 MG/ML IV SOLN
4.0000 mg | INTRAVENOUS | Status: DC | PRN
  Administered 2023-04-02 (×3): 4 mg via INTRAVENOUS
  Filled 2023-04-02 (×3): qty 1

## 2023-04-02 MED ORDER — PHENYLEPHRINE 80 MCG/ML (10ML) SYRINGE FOR IV PUSH (FOR BLOOD PRESSURE SUPPORT)
PREFILLED_SYRINGE | INTRAVENOUS | Status: AC
  Filled 2023-04-02: qty 10

## 2023-04-02 MED ORDER — MELATONIN 5 MG PO TABS
10.0000 mg | ORAL_TABLET | Freq: Every evening | ORAL | Status: DC | PRN
  Administered 2023-04-05 – 2023-04-16 (×5): 10 mg via ORAL
  Filled 2023-04-02 (×5): qty 2

## 2023-04-02 MED ORDER — GLYCOPYRROLATE PF 0.2 MG/ML IJ SOSY
PREFILLED_SYRINGE | INTRAMUSCULAR | Status: AC
  Filled 2023-04-02: qty 2

## 2023-04-02 MED ORDER — DEXAMETHASONE SODIUM PHOSPHATE 10 MG/ML IJ SOLN
INTRAMUSCULAR | Status: AC
  Filled 2023-04-02: qty 1

## 2023-04-02 MED ORDER — TRANEXAMIC ACID-NACL 1000-0.7 MG/100ML-% IV SOLN
INTRAVENOUS | Status: AC
  Filled 2023-04-02: qty 100

## 2023-04-02 MED ORDER — HYDROCORTISONE 10 MG PO TABS
10.0000 mg | ORAL_TABLET | Freq: Every day | ORAL | Status: DC
  Administered 2023-04-02: 10 mg via ORAL
  Filled 2023-04-02: qty 1

## 2023-04-02 MED ORDER — TRANEXAMIC ACID-NACL 1000-0.7 MG/100ML-% IV SOLN
INTRAVENOUS | Status: DC | PRN
  Administered 2023-04-02: 1000 mg via INTRAVENOUS

## 2023-04-02 MED ORDER — ROCURONIUM BROMIDE 10 MG/ML (PF) SYRINGE
PREFILLED_SYRINGE | INTRAVENOUS | Status: AC
  Filled 2023-04-02: qty 10

## 2023-04-02 MED ORDER — INSULIN ASPART 100 UNIT/ML IJ SOLN
0.0000 [IU] | Freq: Three times a day (TID) | INTRAMUSCULAR | Status: DC
  Administered 2023-04-02: 2 [IU] via SUBCUTANEOUS
  Administered 2023-04-02: 3 [IU] via SUBCUTANEOUS
  Administered 2023-04-02: 2 [IU] via SUBCUTANEOUS
  Administered 2023-04-03 (×3): 5 [IU] via SUBCUTANEOUS
  Administered 2023-04-04: 11 [IU] via SUBCUTANEOUS
  Administered 2023-04-04 – 2023-04-05 (×5): 8 [IU] via SUBCUTANEOUS
  Administered 2023-04-06 (×2): 5 [IU] via SUBCUTANEOUS
  Administered 2023-04-07: 2 [IU] via SUBCUTANEOUS
  Administered 2023-04-07: 3 [IU] via SUBCUTANEOUS
  Administered 2023-04-07: 5 [IU] via SUBCUTANEOUS
  Administered 2023-04-08: 3 [IU] via SUBCUTANEOUS
  Administered 2023-04-08: 5 [IU] via SUBCUTANEOUS
  Administered 2023-04-08: 3 [IU] via SUBCUTANEOUS
  Administered 2023-04-09 (×2): 2 [IU] via SUBCUTANEOUS
  Administered 2023-04-10: 5 [IU] via SUBCUTANEOUS
  Administered 2023-04-10: 2 [IU] via SUBCUTANEOUS
  Administered 2023-04-10 – 2023-04-11 (×3): 3 [IU] via SUBCUTANEOUS
  Administered 2023-04-11: 5 [IU] via SUBCUTANEOUS
  Administered 2023-04-12: 3 [IU] via SUBCUTANEOUS
  Administered 2023-04-12: 2 [IU] via SUBCUTANEOUS
  Administered 2023-04-13 – 2023-04-14 (×3): 3 [IU] via SUBCUTANEOUS
  Administered 2023-04-14: 2 [IU] via SUBCUTANEOUS
  Administered 2023-04-14: 3 [IU] via SUBCUTANEOUS
  Administered 2023-04-15 (×3): 2 [IU] via SUBCUTANEOUS
  Administered 2023-04-16: 3 [IU] via SUBCUTANEOUS
  Administered 2023-04-16: 2 [IU] via SUBCUTANEOUS
  Administered 2023-04-16: 3 [IU] via SUBCUTANEOUS
  Administered 2023-04-17: 5 [IU] via SUBCUTANEOUS

## 2023-04-02 MED ORDER — HYDROCORTISONE SOD SUC (PF) 100 MG IJ SOLR
50.0000 mg | Freq: Four times a day (QID) | INTRAMUSCULAR | Status: DC
  Administered 2023-04-03: 50 mg via INTRAVENOUS
  Filled 2023-04-02 (×4): qty 1

## 2023-04-02 MED ORDER — LISINOPRIL 2.5 MG PO TABS
2.5000 mg | ORAL_TABLET | Freq: Every day | ORAL | Status: DC
  Filled 2023-04-02: qty 1

## 2023-04-02 MED ORDER — ONDANSETRON HCL 4 MG/2ML IJ SOLN: 4.0000 mg | Freq: Four times a day (QID) | INTRAMUSCULAR | Status: DC | PRN

## 2023-04-02 MED ORDER — PROPOFOL 10 MG/ML IV BOLUS
INTRAVENOUS | Status: DC | PRN
  Administered 2023-04-02: 100 mg via INTRAVENOUS

## 2023-04-02 MED ORDER — CLINDAMYCIN PHOSPHATE 900 MG/50ML IV SOLN: 900.0000 mg | Freq: Three times a day (TID) | INTRAVENOUS | Status: DC

## 2023-04-02 MED ORDER — GABAPENTIN 300 MG PO CAPS
300.0000 mg | ORAL_CAPSULE | Freq: Every day | ORAL | Status: DC
  Administered 2023-04-02 – 2023-04-17 (×16): 300 mg via ORAL
  Filled 2023-04-02 (×16): qty 1

## 2023-04-02 MED ORDER — CLINDAMYCIN PHOSPHATE 900 MG/50ML IV SOLN
900.0000 mg | Freq: Three times a day (TID) | INTRAVENOUS | Status: DC
  Administered 2023-04-02 – 2023-04-04 (×7): 900 mg via INTRAVENOUS
  Filled 2023-04-02 (×9): qty 50

## 2023-04-02 MED ORDER — ACETAMINOPHEN 325 MG PO TABS
650.0000 mg | ORAL_TABLET | Freq: Four times a day (QID) | ORAL | Status: DC | PRN
  Administered 2023-04-05 – 2023-04-10 (×4): 650 mg via ORAL
  Filled 2023-04-02 (×5): qty 2

## 2023-04-02 MED ORDER — VANCOMYCIN HCL IN DEXTROSE 1-5 GM/200ML-% IV SOLN
1000.0000 mg | Freq: Two times a day (BID) | INTRAVENOUS | Status: DC
  Administered 2023-04-02 – 2023-04-03 (×2): 1000 mg via INTRAVENOUS
  Filled 2023-04-02 (×3): qty 200

## 2023-04-02 MED ORDER — LIDOCAINE 2% (20 MG/ML) 5 ML SYRINGE
INTRAMUSCULAR | Status: AC
  Filled 2023-04-02: qty 5

## 2023-04-02 MED ORDER — SUGAMMADEX SODIUM 200 MG/2ML IV SOLN
INTRAVENOUS | Status: DC | PRN
  Administered 2023-04-02: 200 mg via INTRAVENOUS

## 2023-04-02 MED ORDER — METOPROLOL SUCCINATE ER 25 MG PO TB24
12.5000 mg | ORAL_TABLET | Freq: Every day | ORAL | Status: DC
  Administered 2023-04-02 – 2023-04-05 (×2): 12.5 mg via ORAL
  Filled 2023-04-02 (×4): qty 1

## 2023-04-02 SURGICAL SUPPLY — 38 items
BAG COUNTER SPONGE SURGICOUNT (BAG) ×1 IMPLANT
BAG SPNG CNTER NS LX DISP (BAG)
BAG URO CATCHER STRL LF (MISCELLANEOUS) IMPLANT
BLADE SURG 15 STRL LF DISP TIS (BLADE) ×2 IMPLANT
BLADE SURG 15 STRL SS (BLADE)
BNDG GAUZE DERMACEA FLUFF 4 (GAUZE/BANDAGES/DRESSINGS) IMPLANT
BNDG GZE DERMACEA 4 6PLY (GAUZE/BANDAGES/DRESSINGS) ×2
CANISTER SUCT 3000ML PPV (MISCELLANEOUS) ×1 IMPLANT
CNTNR URN SCR LID CUP LEK RST (MISCELLANEOUS) ×1 IMPLANT
CONT SPEC 4OZ STRL OR WHT (MISCELLANEOUS)
COVER SURGICAL LIGHT HANDLE (MISCELLANEOUS) IMPLANT
DRAPE LAPAROTOMY T 102X78X121 (DRAPES) ×1 IMPLANT
DRAPE SURG IRRIG POUCH 19X23 (DRAPES) IMPLANT
DRAPE UNDERBUTTOCKS STRL (DISPOSABLE) IMPLANT
ELECT REM PT RETURN 9FT ADLT (ELECTROSURGICAL) ×1 IMPLANT
ELECTRODE REM PT RTRN 9FT ADLT (ELECTROSURGICAL) ×1 IMPLANT
GAUZE PAD ABD 8X10 STRL (GAUZE/BANDAGES/DRESSINGS) ×2 IMPLANT
GAUZE SPONGE 4X4 12PLY STRL (GAUZE/BANDAGES/DRESSINGS) ×1 IMPLANT
GLOVE SURG ORTHO 8.0 STRL STRW (GLOVE) ×1 IMPLANT
HANDPIECE INTERPULSE COAX TIP (DISPOSABLE) ×1
KIT BASIN OR (CUSTOM PROCEDURE TRAY) ×1 IMPLANT
KIT TURNOVER KIT B (KITS) ×1 IMPLANT
LEGGING LITHOTOMY PAIR STRL (DRAPES) IMPLANT
NS IRRIG 1000ML POUR BTL (IV SOLUTION) ×1 IMPLANT
PACK GENERAL/GYN (CUSTOM PROCEDURE TRAY) ×1 IMPLANT
PAD ARMBOARD 7.5X6 YLW CONV (MISCELLANEOUS) ×2 IMPLANT
SET HNDPC FAN SPRY TIP SCT (DISPOSABLE) IMPLANT
SHEET MEDIUM DRAPE 40X70 STRL (DRAPES) IMPLANT
SOL PREP POV-IOD 4OZ 10% (MISCELLANEOUS) ×1 IMPLANT
SPONGE T-LAP 18X18 ~~LOC~~+RFID (SPONGE) IMPLANT
SUPPORT SCROTAL MED ADLT STRP (MISCELLANEOUS) IMPLANT
SUPPORT SCROTAL SMALL (MISCELLANEOUS) IMPLANT
SUT CHROMIC 3 0 SH 27 (SUTURE) ×1 IMPLANT
SUT PROLENE 4 0 PS 2 18 (SUTURE) ×1 IMPLANT
SUT VICRYL 4-0 PS2 18IN ABS (SUTURE) ×1 IMPLANT
SWAB COLLECTION DEVICE MRSA (MISCELLANEOUS) ×1 IMPLANT
TOWEL GREEN STERILE (TOWEL DISPOSABLE) ×1 IMPLANT
WATER STERILE IRR 1000ML POUR (IV SOLUTION) ×1 IMPLANT

## 2023-04-02 NOTE — Transfer of Care (Signed)
Immediate Anesthesia Transfer of Care Note  Patient: Peter Yang  Procedure(s) Performed: IRRIGATION AND DEBRIDEMENT ABSCESS (Perineum)  Patient Location: PACU  Anesthesia Type:General  Level of Consciousness: awake, alert , and oriented  Airway & Oxygen Therapy: Patient Spontanous Breathing and Patient connected to nasal cannula oxygen  Post-op Assessment: Report given to RN and Post -op Vital signs reviewed and stable  Post vital signs: Reviewed and stable  Last Vitals:  Vitals Value Taken Time  BP 93/61 04/02/23 1503  Temp    Pulse 85 04/02/23 1507  Resp 14 04/02/23 1507  SpO2 98 % 04/02/23 1507  Vitals shown include unvalidated device data.  Last Pain:  Vitals:   04/02/23 1339  TempSrc:   PainSc: 0-No pain         Complications: No notable events documented.

## 2023-04-02 NOTE — H&P (View-Only) (Signed)
I have been asked to see the patient by Dr. Eric Chen, for evaluation and management of Fournier's gangrene.  History of present illness: 67-year-old otherwise healthy male presented to the emergency department today from the nursing facility with blood and purulence from his Foley catheter and urethra.  He is also complaining of some tenderness and swelling around the suprapubic region and scrotum.  The patient states that he has felt pain in soreness in this area for the last 3 weeks.  He stated that he has had some swelling gets drainage around the penis over the last few days.  Has not had any fevers or chills.  In the emergency department it was noted that the catheter was not draining well.  Further, he was having bloody purulent discharge.  A CT scan was thus obtained demonstrated some free air in the subcutaneous space at the base of the patient's penis suprapubic region as well as within the scrotum.  Urology was consulted for further evaluation.  Review of systems: A 12 point comprehensive review of systems was obtained and is negative unless otherwise stated in the history of present illness.  Patient Active Problem List   Diagnosis Date Noted   UTI (urinary tract infection) due to urinary indwelling catheter (HCC) 04/01/2023   S/P BKA (below knee amputation) unilateral, left (HCC) 04/01/2023   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) 04/01/2023   Long term current use of systemic steroids 04/01/2023   Thrombocytopenia (HCC) 10/01/2022   Orthostatic hypotension 09/24/2022   HFrEF (heart failure with reduced ejection fraction) (HCC) 09/23/2022   Counseling and coordination of care 09/15/2022   Pain 09/14/2022   High risk medication use 09/14/2022   Need for emotional support 09/12/2022   Mood altered 09/11/2022   Medication management 09/11/2022   Palliative care by specialist 09/08/2022   Goals of care, counseling/discussion 09/08/2022   Physical  deconditioning 09/05/2022   Gross hematuria 08/31/2022   Type 2 diabetes mellitus with diabetic peripheral angiopathy without gangrene, without long-term current use of insulin (HCC)    Impaired functional mobility, balance, gait, and endurance 08/03/2022   Obstructive sleep apnea syndrome 08/03/2022   Pleural effusion, bilateral 08/03/2022   Catheter-associated urinary tract infection (HCC) 08/03/2022   History of urinary retention 08/03/2022   Mitral valve regurgitation 08/03/2022   Thoracic ascending aortic aneurysm (HCC) 07/23/2022   Borderline low blood pressure determined by examination 07/04/2022   Coronary artery disease due to lipid rich plaque 06/25/2022   Hematuria 06/25/2022   History of non-ST elevation myocardial infarction (NSTEMI) 06/15/2022   PAF (paroxysmal atrial fibrillation) (HCC) 06/15/2022   Dyslipidemia 06/15/2022   PVD (peripheral vascular disease) (HCC) 06/15/2022   Pressure ulcer of sacral region, stage 2 (HCC) 06/15/2022   Chronic HFrEF (heart failure with reduced ejection fraction) (HCC) 06/15/2022   DNR (do not resuscitate) 06/15/2022   Ischemic cardiomyopathy 02/12/2022   Need for assistance with personal care 08/03/2021   Diabetic polyneuropathy associated with type 2 diabetes mellitus (HCC) 07/28/2021   Prolonged QT interval 07/28/2021   Moderate protein-calorie malnutrition (HCC) 06/29/2021   Chronic insomnia 03/31/2021   BPH with obstruction/lower urinary tract symptoms 02/02/2018   Hyperlipidemia 04/15/2016   Vitamin D deficiency 04/09/2016   Essential hypertension 01/25/2016    No current facility-administered medications on file prior to encounter.   Current Outpatient Medications on File Prior to Encounter  Medication Sig Dispense Refill   acetaminophen (TYLENOL) 325 MG tablet Take 2 tablets (650 mg   total) by mouth every 6 (six) hours as needed for mild pain (or Fever >/= 101). 30 tablet 0   Amino Acids-Protein Hydrolys (PRO-STAT AWC) LIQD  Take 30 mLs by mouth 2 (two) times daily.     atorvastatin (LIPITOR) 80 MG tablet Take 80 mg by mouth daily.     B Complex-C (B-COMPLEX WITH VITAMIN C) tablet Take 1 tablet by mouth daily.     clopidogrel (PLAVIX) 75 MG tablet Take 1 tablet (75 mg total) by mouth daily. 30 tablet 0   cyanocobalamin 1000 MCG tablet Take 1 tablet (1,000 mcg total) by mouth daily.     diclofenac Sodium (VOLTAREN) 1 % GEL Apply 2 g topically 2 (two) times daily as needed (pain).     empagliflozin (JARDIANCE) 10 MG TABS tablet Take 10 mg by mouth daily at 12 noon.     feeding supplement (ENSURE ENLIVE / ENSURE PLUS) LIQD Take 237 mLs by mouth 3 (three) times daily between meals. 237 mL 12   ferrous sulfate 325 (65 FE) MG tablet Take 1 tablet (325 mg total) by mouth daily with breakfast.  3   gabapentin (NEURONTIN) 300 MG capsule Take 300 mg by mouth at bedtime.     hydrocortisone (CORTEF) 10 MG tablet Take 1 tablet (10 mg total) by mouth daily.     hydrocortisone (CORTEF) 5 MG tablet Take 1 tablet (5 mg total) by mouth every evening.     melatonin 5 MG TABS Take 1 tablet (5 mg total) by mouth daily at 6 PM.  0   metFORMIN (GLUCOPHAGE) 500 MG tablet Take 1,000 mg by mouth 2 (two) times daily with a meal.     midodrine (PROAMATINE) 5 MG tablet Take 1 tablet (5 mg total) by mouth with breakfast, with lunch, and with evening meal.     nystatin (MYCOSTATIN/NYSTOP) powder Apply topically 2 (two) times daily. 15 g 0   pantoprazole (PROTONIX) 40 MG tablet Take 1 tablet (40 mg total) by mouth daily. 30 tablet 2   potassium chloride SA (KLOR-CON M) 20 MEQ tablet Take 20 mEq by mouth daily.     Propylene Glycol (SYSTANE BALANCE) 0.6 % SOLN Place 1 drop into both eyes daily as needed.     saccharomyces boulardii (FLORASTOR) 250 MG capsule Take 250 mg by mouth 2 (two) times daily.     senna-docusate (SENOKOT-S) 8.6-50 MG tablet Take 1 tablet by mouth 2 (two) times daily.     tamsulosin (FLOMAX) 0.4 MG CAPS capsule Take 0.4 mg by  mouth daily.     apixaban (ELIQUIS) 5 MG TABS tablet Take 1 tablet (5 mg total) by mouth 2 (two) times daily. 60 tablet 0   bisacodyl (DULCOLAX) 10 MG suppository Place 1 suppository (10 mg total) rectally daily as needed for moderate constipation. 12 suppository 0   furosemide (LASIX) 20 MG tablet Take 1 tablet (20 mg total) by mouth daily for 1 day. 30 tablet    oxyCODONE (OXY IR/ROXICODONE) 5 MG immediate release tablet Take 1 tablet (5 mg total) by mouth every 12 (twelve) hours as needed for up to 4 doses for moderate pain, severe pain or breakthrough pain. 4 tablet 0   polyethylene glycol (MIRALAX / GLYCOLAX) 17 g packet Take 17 g by mouth daily as needed for mild constipation. 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 Overview:  Added automatically from request for surgery [id number] Formatting of this note might be different from the original. Added automatically from request for surgery [id number]   Large bowel perforation (HCC) 09/27/2015   Malignant neoplasm of prostate (HCC) 10/22/2019   Myopia with astigmatism and presbyopia, bilateral 06/15/2018   Last Assessment & Plan:  Formatting of this note might be different from the original. - Wrx printed  Formatting of this note might be different from the original. 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 - Stable, monitor Formatting of this note might be different from the original. Last Assessment & Plan:  - Stable, monitor   Retroperitoneal abscess (HCC) 10/02/2015   Status post corneal transplant 10/07/2016   Formatting of this note might be different from the original. Overview:  Added automatically from request for surgery [id number] Formatting of this note might be different from the original. Added automatically from request for surgery [id number]  Last Assessment & Plan:  Formatting of this note might be different from the original. - OD, 2/2 KCN - Stable, no NVK / rejection - Continue off steroids - Mo   Thoracic ascending aortic aneurysm (HCC)    TIA (transient ischemic attack) 2012   Type 2 diabetes mellitus (HCC)     Past Surgical History:  Procedure Laterality Date   BELOW KNEE LEG AMPUTATION Left 2022   CORONARY ANGIOPLASTY WITH STENT PLACEMENT  01/2022   DES RCA   LEFT HEART CATH AND CORONARY ANGIOGRAPHY N/A 06/17/2022   Procedure: LEFT HEART CATH AND CORONARY ANGIOGRAPHY;  Surgeon: End, Christopher, MD;  Location: [facility];  Service: Cardiovascular;  Laterality: N/A;   ROTATOR CUFF REPAIR Left     Social History   Tobacco Use   Smoking status: Never   Smokeless tobacco: Never  Substance Use Topics   Alcohol use: Never   Drug use: Never    Family History  Problem Relation Age of Onset   Diabetes Mother    Hypertension Mother    Heart disease Mother    Cancer Father    Heart attack Sister    Cancer Sister    Diabetes Brother      PE: Vitals:   04/01/23 2032 04/01/23 2033 04/01/23 2136 04/02/23 0045  BP:  (!) 99/53  (!) 99/56  Pulse: 87 88  97  Resp: 17 18  15  Temp:   98.5 F (36.9 C) 98.3 F (36.8 C)  TempSrc:   Oral Oral  SpO2: 97% 99%  97%  Weight:      Height:         Patient appears to be in no acute distress  patient is alert and oriented x3 Atraumatic normocephalic head No cervical or supraclavicular lymphadenopathy appreciated No increased work of breathing, no audible wheezes/rhonchi Regular sinus rhythm/rate Abdomen is soft, nontender, nondistended, no CVA Patient has a very edematous and tender scrotum and suprapubic region.  There is no cellulitic findings. Lower extremities are symmetric without appreciable edema Grossly neurologically intact No identifiable skin lesions  Recent Labs    04/01/23 1817  WBC 12.7*  HGB 9.8*  HCT 33.0*   Recent Labs    04/01/23 1817  NA 131*  K 4.1  CL 96*  CO2 24  GLUCOSE 191*  BUN 32*  CREATININE 1.13  CALCIUM 8.4*   No results for input(s): "LABPT", "INR" in the last 72 hours. No results for input(s): "LABURIN" in the last 72 hours. Results for orders placed or performed during the hospital encounter of 08/31/22  Urine Culture     Status: None   Collection Time: 08/31/22  4:22 PM   Specimen: Urine, Clean Catch  Result Value Ref Range Status   Specimen Description   Final    URINE, CLEAN CATCH Performed at [facility], [address] [place], [place] [address]    Special Requests   Final    NONE Performed at [facility], [address] [place], [place] [address]    Culture   Final    NO GROWTH Performed at [facility], [address] [place], [place] [address]    Report Status 09/02/2022 FINAL  Final  Blood culture (routine x 2)     Status: None   Collection Time: 08/31/22  6:15 PM   Specimen: BLOOD  Result Value Ref Range Status   Specimen Description   Final    BLOOD SITE  NOT SPECIFIED Performed at [facility], [address] [place], [place] [address]    Special Requests   Final    BOTTLES DRAWN AEROBIC AND ANAEROBIC Blood Culture adequate volume Performed at [facility], [address] [place], [place] [address]    Culture   Final    NO GROWTH 5 DAYS Performed at [facility], [address] [place], [place] [address]    Report Status 09/05/2022 FINAL  Final  Blood culture (routine x 2)     Status: None   Collection Time: 08/31/22  6:30 PM   Specimen: BLOOD  Result Value Ref Range Status   Specimen Description   Final    BLOOD SITE NOT SPECIFIED Performed at [facility], [address] [place], [place] [address]    Special Requests   Final    BOTTLES DRAWN AEROBIC AND ANAEROBIC Blood Culture results may not be optimal due to an inadequate volume of blood received in culture bottles Performed at [facility], [address] [place], [place] [address]    Culture   Final    NO GROWTH 5 DAYS Performed at  Hospital Lab, [address] [place],  [address]    Report Status 09/06/2022 FINAL  Final  MRSA Next Gen by PCR, Nasal     Status: None   Collection Time: 09/01/22 12:00 AM   Specimen: Nasal Mucosa; Nasal Swab  Result Value Ref Range Status   MRSA by PCR Next Gen NOT DETECTED NOT DETECTED Final    Comment: (NOTE) The GeneXpert MRSA Assay (FDA approved for NASAL specimens only), is   one component of a comprehensive MRSA colonization surveillance program. It is not intended to diagnose MRSA infection nor to guide or monitor treatment for MRSA infections. Test performance is not FDA approved in patients less than 2 years old. Performed at [facility], [address] [place], [place] [address]     Imaging: I reviewed the patient's CT scan which demonstrates some subcutaneous air in the suprapubic region and the base of  his penis and some fat stranding in the adjacent areas.  Imp: The patient appears to have necrotizing infection suprapubic region the base of his penis.  His exam is fairly benign compared to his CAT scan although he does have some tenderness with palpation.  His vital signs are stable he is otherwise doing reasonably well.  Unfortunately the patient is on Eliquis, he was given his Eliquis yesterday at 7 AM.  Further, the patient ate an hour prior to the consultation.  Recommendations: The patient will need I&D of the area around the base of his penis and scrotum in the bilateral inguinal region.  Went through that with him in detail.  However, at this time because of the patient's n.p.o. status.  Further, the Eliquis certainly will complicate things given that he had it less than 24 hours ago.  Tentatively we have scheduled him for I&D tomorrow afternoon.  Distal given plenty of time to digest the food that is eaten and allow more time for the Eliquis to wear off.  If his condition changes please alert me to that we could try to move him up and get things done more quickly.   Rhyder Bratz W Thien Berka   

## 2023-04-02 NOTE — Anesthesia Preprocedure Evaluation (Addendum)
Anesthesia Evaluation  Patient identified by MRN, date of birth, ID band Patient awake    Reviewed: Allergy & Precautions, H&P , NPO status , Patient's Chart, lab work & pertinent test results  Airway Mallampati: II  TM Distance: >3 FB Neck ROM: Full    Dental no notable dental hx. (+) Poor Dentition   Pulmonary neg pulmonary ROS, sleep apnea and Continuous Positive Airway Pressure Ventilation    Pulmonary exam normal breath sounds clear to auscultation       Cardiovascular Exercise Tolerance: Good hypertension, Pt. on medications and Pt. on home beta blockers + CAD and + Peripheral Vascular Disease  Normal cardiovascular exam+ dysrhythmias Atrial Fibrillation  Rhythm:Regular Rate:Normal     Neuro/Psych TIA Neuromuscular disease negative neurological ROS  negative psych ROS   GI/Hepatic negative GI ROS, Neg liver ROS,,,  Endo/Other  diabetes, Type 2    Renal/GU negative Renal ROS  negative genitourinary   Musculoskeletal negative musculoskeletal ROS (+)    Abdominal   Peds negative pediatric ROS (+)  Hematology negative hematology ROS (+)   Anesthesia Other Findings   Reproductive/Obstetrics negative OB ROS                             Anesthesia Physical Anesthesia Plan  ASA: 4 and emergent  Anesthesia Plan: General   Post-op Pain Management:    Induction: Intravenous  PONV Risk Score and Plan: 2 and Ondansetron and Treatment may vary due to age or medical condition  Airway Management Planned: LMA and Oral ETT  Additional Equipment: None  Intra-op Plan:   Post-operative Plan: Extubation in OR  Informed Consent: I have reviewed the patients History and Physical, chart, labs and discussed the procedure including the risks, benefits and alternatives for the proposed anesthesia with the patient or authorized representative who has indicated his/her understanding and acceptance.     Discussed DNR with patient and Continue DNR.   Dental advisory given  Plan Discussed with: CRNA, Surgeon and Anesthesiologist  Anesthesia Plan Comments: (ECHO 11/23 1. Left ventricular ejection fraction, by estimation, is 30 to 35%. The  left ventricle has moderately decreased function. The left ventricle  demonstrates regional wall motion abnormalities (see scoring  diagram/findings for description). The left  ventricular internal cavity size was mildly dilated. Left ventricular  diastolic parameters are consistent with Grade II diastolic dysfunction  (pseudonormalization).   2. Right ventricular systolic function is moderately reduced. The right  ventricular size is mildly enlarged. There is mildly elevated pulmonary  artery systolic pressure. The estimated right ventricular systolic  pressure is 44.7 mmHg.   3. Left atrial size was moderately dilated.   4. Right atrial size was mildly dilated.   5. The mitral valve is abnormal. Moderate mitral valve regurgitation. No  evidence of mitral stenosis.   6. The aortic valve is tricuspid. There is mild calcification of the  aortic valve. Aortic valve regurgitation is not visualized. Aortic valve  sclerosis is present, with no evidence of aortic valve stenosis.   7. Aortic dilatation noted. There is mild dilatation of the aortic root,  measuring 42 mm.   8. The inferior vena cava is normal in size with <50% respiratory  variability, suggesting right atrial pressure of 8 mmHg.   PATIENT WOULD LIKE TO MAINTAIN DNR.  HOWEVER HE IS GOOD WITH NO CHEST COMPRESSIONS AND CARDIAC DRUGS OK!)        Anesthesia Quick Evaluation

## 2023-04-02 NOTE — Progress Notes (Signed)
The patient was taken to the operating room for incision and drainage of his infection.  He has 2 5 or 6 inch incisions along the groin/scrotum region bilaterally.  Each area is packed with a 4 x 4 Curlex soaked in Betadine.  The incision extends down to the pubic bone which is approximately 7 cm deep.  This dressing will need to be changed tomorrow using wet-to-dry dressing.  We will be by to help facilitate this.

## 2023-04-02 NOTE — Op Note (Signed)
Preoperative diagnosis:  Necrotizing soft tissue infection in the scrotum to the base of the penis  Postoperative diagnosis:  Same  Procedure: Incision and drainage of infection scrotal with some tissue debridement  Surgeon: Crist Fat, MD  Anesthesia: General  Complications: None  Intraoperative findings:  1: I made bilateral incisions along the groin and scrotum and carried the incision down to the base of the penis and up along the spermatic cord.  I encountered purulent drainage and necrotic tissue at the base of the penis, left greater than right. #2: The necrotic tissue involved tissue all the way to the pubic bone bilaterally along the base of the penis. #3: Once the tissue had been debrided and the infection drained I packed it with a 4 inch Kerlix soaked in Betadine #4: The patient was given TXA as well as prothrombin in order to reverse his Eliquis.  EBL: Minimal  Specimens: None  Indication: Peter Yang is a 67 y.o. patient with multiple comorbidities who presented to the emergency department with suprapubic tenderness and purulent discharge from around the Foley catheter.  CT scan demonstrated subcutaneous air around the suprapubic region and to the base of the penis consistent with Fournier's gangrene..  After reviewing the management options for treatment, he elected to proceed with the above surgical procedure(s). We have discussed the potential benefits and risks of the procedure, side effects of the proposed treatment, the likelihood of the patient achieving the goals of the procedure, and any potential problems that might occur during the procedure or recuperation. Informed consent has been obtained.  Description of procedure:   The patient was brought back to the operating room placed on the table in supine position.  He was placed in dorsolithotomy position, and he was then prepped and draped in routine sterile fashion.  I made about a 5 inch incision  along the lateral infrapubic and lateral scrotum bilaterally.  These incisions down into the scrotum.  I then finger dissected down around the testicle bilaterally.  I also finger dissected up along the base of the penis and up along the spermatic cord to the external iliac ring.  Purulent drainage was noted at the base of the penis bilaterally with some necrotic tissue in that area.  Sharply debrided this.  There were no additional pockets of infection that I was able to catheter find with gentle blunt dissection.  Using the Bugbee I cauterized several of the areas that were bleeding.  I then used the pulse lavage and irrigated with 3 L of normal saline.  I then again achieved hemostasis and packed the area with a Curlex impregnated with Betadine.  I then used another Kerlix dressing to pad the outside of the patient's scrotum and put it on some elevation providing some scrotal support along with mesh underpants.  He was subsequently extubated return the PACU in good condition.

## 2023-04-02 NOTE — Progress Notes (Signed)
Patient scheduled for debridement of groin this afternoon.  I have ordered prothrombin complex to be administered at 1pm to help counteract his eliquis during his surgery.  He will need to be off Eliquis and anticoagulants for the near future while we treat this infection - which will almost certainly require daily dressing changes and possibly several trips to the OR.

## 2023-04-02 NOTE — Progress Notes (Signed)
  X-cover Note: Received call from EDP that CT renal stone study shows gas in SQ at base of penis and into ant abd wall. Discussed case with Dr. Marlou Porch with urology.  Pt has been made NPO.  Carollee Herter, DO [facility]

## 2023-04-02 NOTE — Progress Notes (Signed)
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  Patient had acute urinary tension on Saturday, June 8.  He had Damaya Channing Foley catheter placed at that time.  He started developing pain in his penis yesterday.  He was sent to the ER for evaluation.   Arrival temp 98.4 heart rate 100 blood pressure 95/66   White count 12.7, hemoglobin 9.8, platelets of 277   Sodium 131, potassium 4.1, bicarb 24, BUN of 32, creatinine 1.1, glucose 191   UA showed turbid colored urine.  Greater than 50 RBCs.   [facility] contacted for admission.   Upon review of his chart, patient had been admitted to [facility] back in October 2023 where he was treated with ertapenem for Ronnell Clinger ESBL E. coli UTI.  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   Gross hematuria Due to foley and UTI.  Eliquis currently on hold, will resume post op when ok with urology   Type 2 diabetes mellitus with diabetic peripheral angiopathy without gangrene, without long-term current use of insulin (HCC) Continue with SSI and lantus.   History of urinary retention Foley catheter placed on Saturday 03-29-2023. Start flomax   DNR (do not resuscitate) Verified with pt that he is Meela Wareing DNR/DNI.  Chronic HFrEF (heart failure with reduced ejection fraction) (HCC) Stable.   PAF (paroxysmal atrial fibrillation) (HCC) Stable.   Essential hypertension Stable. Has hx of orthostatic hypotension.   Long term current use of systemic steroids Pt remains on cortef bid. Unclear why he is still on cortef.  Appears to have started in October/November 2023. Low threshold for stress dose steroids, I think can hold off on this currently.     DVT prophylaxis: SCD Code Status: DNR Family Communication: none Disposition:   Status is: Inpatient  Remains inpatient appropriate because: need for continued care   Consultants:  Urology PCCM  Procedures:  Incision and drainage of infection scrotal with some tissue debridement   Antimicrobials:  Anti-infectives (From admission, onward)    Start     Dose/Rate Route Frequency Ordered Stop   04/02/23 1400  vancomycin (VANCOCIN) IVPB 1000 mg/200 mL premix         1,000 mg 200 mL/hr over 60 Minutes Intravenous Every 12 hours 04/02/23 0036     04/02/23 0115  vancomycin (VANCOREADY) IVPB 2000 mg/400 mL        2,000 mg 200 mL/hr over 120 Minutes Intravenous  Once 04/02/23 0015 04/02/23 0529   04/02/23 0100  clindamycin (CLEOCIN) IVPB 900 mg  Status:  Discontinued        900 mg 100 mL/hr over 30 Minutes Intravenous Every 8 hours 04/02/23 0002 04/02/23 0005   04/02/23 0100  clindamycin (CLEOCIN) IVPB 900 mg        900 mg 100 mL/hr over 30 Minutes Intravenous Every 8 hours 04/02/23 0012 04/05/23 0059   04/02/23 0000  meropenem (MERREM) 1 g in sodium chloride 0.9 % 100 mL IVPB        1 g 200 mL/hr over 30 Minutes Intravenous Every 8 hours 04/01/23 2352     04/01/23 2215  meropenem (MERREM) 2 g in sodium chloride 0.9 % 100 mL IVPB  Status:  Discontinued        2 g 280 mL/hr over 30 Minutes Intravenous  Once 04/01/23 2221 04/01/23 2352   04/01/23 1800  cefTRIAXone (ROCEPHIN) 1 g in sodium chloride 0.9 % 100 mL IVPB        1 g 200 mL/hr over 30 Minutes Intravenous  Once 04/01/23 1746 04/01/23 1902       Subjective: C/o R foot pain  Objective: Vitals:   04/02/23 1545 04/02/23 1600 04/02/23 1625 04/02/23 1637  BP: (!) 82/53 (!) 80/53 (!) 81/48 94/62  Pulse: 83 83 79 79  Resp: 13 11 18    Temp:  97.6 F (36.4 C) (!) 97.3 F (36.3 C)   TempSrc:   Oral   SpO2: 94% 94% 98%   Weight:      Height:        Intake/Output Summary (Last 24 hours) at 04/02/2023 1749 Last data filed at 04/02/2023 1719 Gross per 24 hour  Intake 1885.47 ml  Output 1700 ml  Net 185.47 ml   Filed Weights   04/01/23 1648 04/02/23 1324  Weight: 118.1 kg 102.1 kg    Examination:  General exam: Appears calm and comfortable  Respiratory system: unlabored Cardiovascular system: RRR Gastrointestinal system: Abdomen is nondistended, soft and nontender.  GU penile and scrotal swelling Central nervous system: Alert and oriented. No focal neurological deficits. Extremities:  surgical wound to RLE, R knee ulceration, L BKA    Data Reviewed: I have personally reviewed following labs and imaging studies  CBC: Recent Labs  Lab 04/01/23 1817 04/02/23 0318  WBC 12.7* 11.6*  NEUTROABS 10.1* 8.8*  HGB 9.8* 9.5*  HCT 33.0* 31.0*  MCV 80.7 77.5*  PLT 277 271    Basic Metabolic Panel: Recent Labs  Lab 04/01/23 1817 04/02/23 0318  NA 131* 131*  K 4.1 3.7  CL 96* 98  CO2 24 23  GLUCOSE 191* 174*  BUN 32* 30*  CREATININE 1.13 1.01  CALCIUM 8.4* 8.1*    GFR: Estimated Creatinine Clearance: 88.3 mL/min (by C-G formula based on SCr of 1.01 mg/dL).  Liver  Function Tests: Recent Labs  Lab 04/02/23 0318  AST 21  ALT 18  ALKPHOS 78  BILITOT 0.4  PROT 5.5*  ALBUMIN 1.8*    CBG: Recent Labs  Lab 04/02/23 0222 04/02/23 0628 04/02/23 1237 04/02/23 1505  GLUCAP 158* 143* 128* 133*     Recent Results (from the past 240 hour(s))  Urine Culture     Status: None   Collection Time: 04/01/23  5:46 PM   Specimen: Urine, Catheterized  Result Value Ref Range Status   Specimen Description URINE, CATHETERIZED  Final   Special Requests NONE  Final   Culture   Final    NO GROWTH Performed at [facility], [address] [place], [place] [address]    Report Status 04/02/2023 FINAL  Final  MRSA Next Gen by PCR, Nasal     Status: Abnormal   Collection Time: 04/02/23  1:00 AM   Specimen: Nasal Mucosa; Nasal Swab  Result Value Ref Range Status   MRSA by PCR Next Gen DETECTED (Maat Kafer) NOT DETECTED Final    Comment: RESULT CALLED TO, READ BACK BY AND VERIFIED WITH: RN KAITLYN BLUCHER ON 04/02/23 @ 1515 BY DRT (NOTE) The GeneXpert MRSA Assay (FDA approved for NASAL specimens only), is one component of [facility] comprehensive MRSA colonization surveillance program. It is not intended to diagnose MRSA infection nor to guide or monitor treatment for MRSA infections. Test performance is not FDA approved in patients less than 83 years old. Performed at [facility], [address] [place], [place] [address]          Radiology Studies: CT Renal Stone Study  Result Date: 04/01/2023 CLINICAL DATA:  Bladder diverticulum EXAM: CT ABDOMEN AND PELVIS WITHOUT CONTRAST TECHNIQUE: Multidetector CT imaging of the abdomen and pelvis was performed following the standard protocol without IV contrast. RADIATION DOSE REDUCTION: This exam was performed according to the departmental dose-optimization program which includes automated exposure control, adjustment of the mA and/or kV according to patient size and/or use of iterative reconstruction technique. COMPARISON:  01/22/2023 FINDINGS: Lower chest: Moderate bilateral pleural effusions. Compressive atelectasis in the lower lobes. Diffuse calcifications in the visualized right coronary artery. Moderate aortic calcifications. Hepatobiliary: Layering gallstones within the gallbladder. No focal hepatic abnormality. Pancreas: No focal abnormality or ductal dilatation. Spleen: No focal abnormality.  Normal size. Adrenals/Urinary Tract: Adrenal glands normal. Mild left hydronephrosis, similar prior study. Mild caliectasis on the right. Right ureters decompressed. Foley catheter is present within the bladder which is decompressed. There appears to be mild bladder wall thickening. Stomach/Bowel: Stomach, large and small bowel grossly unremarkable. Vascular/Lymphatic: Diffuse aortoiliac atherosclerosis. No evidence of aneurysm or adenopathy. Reproductive: There is gas noted within the subcutaneous soft tissues surrounding the base of the penis and extending in the lower anterior abdominal wall concerning for Fournier's gangrene. Diffuse edema noted throughout the scrotal wall. Other: Diffuse anasarca/edema throughout the abdominal wall and pelvis. Musculoskeletal: No acute bony abnormality. IMPRESSION: Gas and soft tissue stranding noted within the subcutaneous soft tissues surrounding the base of the penis and extending  superiorly in the anterior abdominal wall concerning for Fournier's gangrene. Diffuse edema throughout the scrotal wall. Bladder is decompressed with Foley catheter present, but appears thick walled. This is associated with mild left hydronephrosis and right renal pelvicaliectasis. These findings are similar to prior study. Cholelithiasis. Coronary artery disease, aortic atherosclerosis. Moderate bilateral pleural effusions, bibasilar atelectasis. Electronically Signed   By: Charlett Nose M.D.   On: 04/01/2023 23:54  Scheduled Meds:  atorvastatin  80 mg Oral Daily   gabapentin  300 mg Oral QHS   hydrocortisone  10 mg Oral Daily   And   hydrocortisone  5 mg Oral QHS   insulin aspart  0-15 Units Subcutaneous TID WC   insulin aspart  0-5 Units Subcutaneous QHS   insulin glargine-yfgn  15 Units Subcutaneous QHS   lisinopril  2.5 mg Oral Daily   metoprolol succinate  12.5 mg Oral Daily   pantoprazole  40 mg Oral Daily   Continuous Infusions:  sodium chloride 100 mL/hr at 04/02/23 0225   clindamycin (CLEOCIN) IV 900 mg (04/02/23 0920)   meropenem (MERREM) IV 1 g (04/02/23 1634)   vancomycin Stopped (04/02/23 1359)     LOS: 0 days    Time spent: over 30 min    Lacretia Nicks, MD [facility]   To contact the attending provider between 7A-7P or the covering provider during after hours 7P-7A, please log into the web site [email or web address] and access using universal  password for that web site. If you do not have the password, please call the hospital operator.  04/02/2023, 5:49 PM

## 2023-04-02 NOTE — Consult Note (Addendum)
NAME:  Peter Yang, MRN:  [id number], DOB:  Mar 01, 1956, LOS: 0 ADMISSION DATE:  04/01/2023, CONSULTATION DATE: 04/02/2023 REFERRING MD: [facility], CHIEF COMPLAINT: Foreign years gangrene sepsis  History of Present Illness:  67 year old male who is a nursing home resident he has a plethora of health issues that are well-documented below and is listed as a DNR at this time.  He has multiple organ dysfunction.  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  He has Fournier's gangrene has a Foley catheter in place that is draining pus.  Plan is for him to go to surgery for surgical debridement and there is anticipation that he will worsen asked for surgical interventions.  He remains a DNR at this time.  Pulmonary critical care asked to evaluate and help in his care postsurgical.  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 Overview:  Added automatically from request for surgery [id number] Formatting of this note might be different from the original. Added automatically from request for surgery [id number]   Large bowel perforation (HCC) 09/27/2015   Malignant neoplasm of prostate (HCC) 10/22/2019   Myopia with astigmatism and  presbyopia, bilateral 06/15/2018   Last Assessment & Plan:  Formatting of this note might be different from the original. - Wrx printed Formatting of this note might be different from the original. 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 - Stable, monitor Formatting of this note might be different from the original. Last Assessment & Plan:  - Stable, monitor   Retroperitoneal abscess (HCC) 10/02/2015   Status post corneal transplant 10/07/2016   Formatting of this note might be different from the original. Overview:  Added automatically from request for surgery [id number] Formatting of this note might be different from the original. Added automatically from request for surgery [id number]  Last Assessment & Plan:  Formatting of this note might be different from the original. - OD, 2/2 KCN - Stable, no NVK / rejection - Continue off steroids - Mo   Thoracic ascending aortic aneurysm (HCC)    TIA (transient ischemic attack) 2012   Type 2 diabetes mellitus (HCC)      Significant Hospital Events: Including procedures, antibiotic start and stop dates in addition to other pertinent events     Interim History / Subjective:  Plans for surgical debridement  Objective   Blood pressure 93/60, pulse 96, temperature 98.6  F (37 C), temperature source Oral, resp. rate 18, height 6\' 4"  (1.93 m), weight 118.1 kg, SpO2 93 %.    FiO2 (%):  [0 %] 0 %   Intake/Output Summary (Last 24 hours) at 04/02/2023 1138 Last data filed at 04/02/2023 1039 Gross per 24 hour  Intake 1305.47 ml  Output 1400 ml  Net -94.53 ml   Filed Weights   04/01/23 1648  Weight: 118.1 kg     Examination: General: 67 year old male who appears older than stated age HENT: No JVD or lymphadenopathy is appreciated Lungs: Decreased breath sounds in the bases Cardiovascular: Heart sounds are distant Abdomen: The soft positive bowel sounds Extremities: Left BKA, right 2 toes missing previous surgery Neuro: Awake and follows commands GU: Foley catheter in place perineal area is inflamed pus is draining from the Foley catheter  Resolved Hospital Problem list     Assessment & Plan:  Sepsis in the setting of Fournier gangrene to the pubic area in the setting of multiorgan dysfunction.  Pulmonary critical care is asked to be available for any complications.  Draining pus from Foley catheter. 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 Will need increase steroids for surgical intervention  Diabetes mellitus Per primary  Stage III sacral wound Wound ostomy is following  History of heart failure Cardiology consult  Poor surgical candidate May need palliation  Bilateral pleural effusions are currently a moot point Serial chest x-rays     Best Practice (right click and "Reselect all SmartList Selections" daily)   Diet/type: Regular consistency (see orders) DVT prophylaxis: not indicated GI prophylaxis: PPI Lines: N/A Foley:  N/A Code Status:  DNR Last date of multidisciplinary goals of care discussion [tbd]  Labs   CBC: Recent Labs  Lab 04/01/23 1817 04/02/23 0318  WBC 12.7* 11.6*  NEUTROABS 10.1* 8.8*  HGB 9.8* 9.5*  HCT 33.0* 31.0*  MCV 80.7 77.5*  PLT 277 271    Basic Metabolic Panel: Recent Labs  Lab 04/01/23 1817 04/02/23 0318  NA 131* 131*  K 4.1 3.7  CL 96* 98  CO2 24 23  GLUCOSE 191* 174*  BUN 32* 30*  CREATININE 1.13 1.01  CALCIUM 8.4* 8.1*   GFR: Estimated Creatinine Clearance: 101 mL/min  (by C-G formula based on SCr of 1.01 mg/dL). Recent Labs  Lab 04/01/23 1817 04/02/23 0318  WBC 12.7* 11.6*    Liver Function Tests: Recent Labs  Lab 04/02/23 0318  AST 21  ALT 18  ALKPHOS 78  BILITOT 0.4  PROT 5.5*  ALBUMIN 1.8*   No results for input(s): "LIPASE", "AMYLASE" in the last 168 hours. No results for input(s): "AMMONIA" in the last 168 hours.  ABG    Component Value Date/Time   HCO3 15.9 (L) 06/25/2022 1706   TCO2 17 (L) 06/25/2022 1706   ACIDBASEDEF 8.0 (H) 06/25/2022 1706   O2SAT 62.8 09/15/2022 0620     Coagulation Profile: No results for input(s): "INR", "PROTIME" in the last 168 hours.  Cardiac Enzymes: No results for input(s): "CKTOTAL", "CKMB", "CKMBINDEX", "TROPONINI" in the last 168 hours.  HbA1C: Hgb A1c MFr Bld  Date/Time Value Ref Range Status  04/02/2023 03:18 AM 7.0 (H) 4.8 - 5.6 % Final    Comment:    (NOTE) Pre diabetes:          5.7%-6.4%  Diabetes:              >  6.4%  Glycemic control for   <7.0% adults with diabetes   08/03/2022 04:11 AM 6.6 (H) 4.8 - 5.6 % Final    Comment:    (NOTE) Pre diabetes:          5.7%-6.4%  Diabetes:              >6.4%  Glycemic control for   <7.0% adults with diabetes     CBG: Recent Labs  Lab 04/02/23 0222 04/02/23 0628  GLUCAP 158* 143*    Review of Systems:   10 point review of system taken, please see HPI for positives and negatives.   Past Medical History:  He,  has a past medical history of BPH with obstruction/lower urinary tract symptoms (02/02/2018), CAD (coronary artery disease), Congenital talipes calcaneovarus, 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), Coronary artery disease involving native coronary artery of native heart without angina pectoris (06/25/2022), Diabetic polyneuropathy associated with type 2 diabetes mellitus (HCC) (07/28/2021), Essential hypertension (01/25/2016), Hematuria and +fecal occult  (06/25/2022), History of diabetic ulcer of foot (12/31/2019), History of non-ST  elevation myocardial infarction (NSTEMI) (06/15/2022), right coronary artery stent placement (02/11/2022), Hyperlipidemia, Ischemic cardiomyopathy, 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), Large bowel perforation (HCC) (09/27/2015), Malignant neoplasm of prostate (HCC) (10/22/2019), Myopia with astigmatism and presbyopia, bilateral (06/15/2018), Nontraumatic complete tear of left rotator cuff (09/20/2019), 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   Surgical History:   Past Surgical History:  Procedure Laterality Date   BELOW KNEE LEG AMPUTATION Left 2022   CORONARY ANGIOPLASTY WITH STENT PLACEMENT  01/2022   DES RCA   LEFT HEART CATH AND CORONARY ANGIOGRAPHY N/A 06/17/2022   Procedure: LEFT HEART CATH AND CORONARY ANGIOGRAPHY;  Surgeon: Yvonne Kendall, MD;  Location: [facility];  Service: Cardiovascular;  Laterality: N/A;   ROTATOR CUFF REPAIR Left      Social History:   reports that he has never smoked. He has never used smokeless tobacco. He reports that he does not drink alcohol and does not use drugs.   Family History:  His family history includes Cancer in his father and sister; Diabetes in his brother and mother; Heart attack in his sister; Heart disease in his mother; Hypertension in his mother.   Allergies No Known Allergies   Home Medications  Prior to Admission medications   Medication Sig Start Date End Date Taking? Authorizing Provider  acetaminophen (TYLENOL) 325 MG tablet Take 2 tablets (650 mg total) by mouth every 6 (six) hours as needed for mild pain (or Fever >/= 101). 07/30/21  Yes Sheikh, Omair Latif, DO  albuterol (PROVENTIL)  (2.5 MG/3ML) 0.083% nebulizer solution Take 2.5 mg by nebulization every 4 (four) hours as needed for wheezing or shortness of breath.   Yes [provider]  albuterol (VENTOLIN HFA) 108 (90 Base) MCG/ACT inhaler Inhale 2 puffs into the lungs every 4 (four) hours as needed for wheezing or shortness of breath.   Yes [provider]  Amino Acids-Protein Hydrolys (FEEDING SUPPLEMENT, PRO-STAT SUGAR FREE 64,) LIQD Take 30 mLs by mouth daily.   Yes [provider]  apixaban (ELIQUIS) 5 MG TABS tablet Take 1 tablet (5 mg total) by mouth 2 (two) times daily. 08/16/22 04/02/23 Yes Levin Erp, MD  aspirin EC 81 MG tablet Take  81 mg by mouth daily. Swallow whole.   Yes [provider]  atorvastatin (LIPITOR) 80 MG tablet Take 80 mg by mouth every evening. 03/17/21  Yes [provider]  B Complex-C (B-COMPLEX WITH VITAMIN C) tablet Take 1 tablet by mouth daily. 10/24/22  Yes Lanae Boast, MD  bisacodyl (DULCOLAX) 10 MG suppository Place 1 suppository (10 mg total) rectally daily as needed for moderate constipation. 10/24/22  Yes Lanae Boast, MD  cephALEXin (KEFLEX) 500 MG capsule Take 500 mg by mouth 3 (three) times daily. Starting 03/30/23 - 04/06/23   Yes [provider]  diclofenac Sodium (VOLTAREN) 1 % GEL Apply 1 g topically daily. Apply to bilateral knees 07/20/21  Yes [provider]  empagliflozin (JARDIANCE) 10 MG TABS tablet Take 5 mg by mouth daily at 12 noon.   Yes [provider]  ferrous sulfate 325 (65 FE) MG tablet Take 1 tablet (325 mg total) by mouth daily with breakfast. 10/25/22  Yes Kc, Ramesh, MD  finasteride (PROSCAR) 5 MG tablet Take 5 mg by mouth daily.   Yes [provider]  furosemide (LASIX) 20 MG tablet Take 1 tablet (20 mg total) by mouth daily for 1 day. Patient taking differently: Take 20 mg by mouth daily. PRN order: take 20 mg by mouth once daily as needed for swelling 10/24/22 04/02/23 Yes Kc, Dayna Barker, MD   gabapentin (NEURONTIN) 300 MG capsule Take 300 mg by mouth 3 (three) times daily. 07/20/21  Yes [provider]  hydrocortisone (CORTEF) 10 MG tablet Take 1 tablet (10 mg total) by mouth daily. 10/24/22  Yes Lanae Boast, MD  hydrocortisone (CORTEF) 5 MG tablet Take 1 tablet (5 mg total) by mouth every evening. 10/24/22  Yes Kc, Dayna Barker, MD  insulin glargine (LANTUS SOLOSTAR) 100 UNIT/ML Solostar Pen Inject 15 Units into the skin at bedtime.   Yes [provider]  lisinopril (ZESTRIL) 2.5 MG tablet Take 2.5 mg by mouth daily.   Yes [provider]  Magnesium 400 MG TABS Take 400 mg by mouth in the morning and at bedtime.   Yes [provider]  melatonin 5 MG TABS Take 1 tablet (5 mg total) by mouth daily at 6 PM. Patient taking differently: Take 5 mg by mouth every evening. 10/24/22  Yes Lanae Boast, MD  metFORMIN (GLUCOPHAGE) 1000 MG tablet Take 1,000 mg by mouth 2 (two) times daily with a meal.   Yes [provider]  metoprolol succinate (TOPROL-XL) 25 MG 24 hr tablet Take 12.5 mg by mouth daily.   Yes [provider]  Miconazole POWD Apply 1 Application topically in the morning and at bedtime.   Yes [provider]  OXYGEN Inhale 2 L into the lungs continuous. To maintain stats above 90%  PRN order: use as needed for shortness of breath while laying flat on exertion   Yes [provider]  pantoprazole (PROTONIX) 40 MG tablet Take 1 tablet (40 mg total) by mouth daily. 06/30/22  Yes Pokhrel, Laxman, MD  polyethylene glycol (MIRALAX / GLYCOLAX) 17 g packet Take 17 g by mouth daily as needed for mild constipation. 10/24/22  Yes Lanae Boast, MD  Propylene Glycol (SYSTANE BALANCE) 0.6 % SOLN Place 1 drop into both eyes every 4 (four) hours as needed (dry eye).   Yes [provider]  sodium chloride (OCEAN) 0.65 % SOLN nasal spray Place 1 spray into both nostrils every 6 (six) hours as needed for congestion.   Yes [provider]  tamsulosin (FLOMAX) 0.4 MG CAPS capsule Take 0.4 mg by mouth daily. 07/29/22  Yes [provider]  traMADol (ULTRAM) 50 MG tablet Take 50 mg by mouth every 8 (eight) hours as needed for moderate pain.   Yes [provider]  traZODone (DESYREL) 100 MG tablet Take 50 mg by mouth at bedtime.   Yes [provider]  zinc oxide 20 % ointment Apply 1 Application topically in the morning, at noon, and at bedtime. Apply to buttocks   Yes [provider]  clopidogrel (PLAVIX) 75 MG tablet Take 1 tablet (75 mg total) by mouth daily. Patient not taking: Reported on 04/02/2023 08/16/22   Levin Erp, MD  cyanocobalamin 1000 MCG tablet Take 1 tablet (1,000 mcg total) by mouth daily. Patient not taking: Reported on 04/02/2023 10/24/22   Lanae Boast, MD  feeding supplement (ENSURE ENLIVE / ENSURE PLUS) LIQD Take 237 mLs by mouth 3 (three) times daily between meals. Patient not taking: Reported on 04/02/2023 10/24/22   Lanae Boast, MD  metFORMIN (GLUCOPHAGE) 500 MG tablet Take 1,000 mg by mouth 2 (two) times daily with a meal. Patient not taking: Reported on 04/02/2023    [provider]  midodrine (PROAMATINE) 5 MG tablet Take 1 tablet (5 mg total) by mouth with breakfast, with lunch, and with evening meal. Patient not taking: Reported on 04/02/2023 10/24/22   Lanae Boast, MD  Multiple Vitamins-Minerals (DECUBI-VITE PO) Take 1 capsule by mouth daily. Patient not taking: Reported on 04/02/2023    [provider]  nystatin (MYCOSTATIN/NYSTOP) powder Apply topically 2 (two) times daily. Patient not taking: Reported on 04/02/2023 10/24/22   Lanae Boast, MD  oxyCODONE (OXY IR/ROXICODONE) 5 MG immediate release tablet Take 1 tablet (5 mg total) by mouth every 12 (twelve) hours as needed for up to 4 doses for moderate pain, severe pain or breakthrough pain. Patient not taking: Reported on 04/02/2023 10/24/22   Lanae Boast, MD  potassium chloride (KLOR-CON) 10 MEQ tablet Take 10  mEq by mouth. Patient not taking: Reported on 04/02/2023    [provider]  saccharomyces boulardii (FLORASTOR) 250 MG capsule Take 250 mg by mouth 2 (two) times daily. Patient not taking: Reported on 04/02/2023    [provider]  senna-docusate (SENOKOT-S) 8.6-50 MG tablet Take 1 tablet by mouth 2 (two) times daily. Patient not taking: Reported on 04/02/2023 10/24/22   Lanae Boast, MD     Critical care time:    Brett Canales Marjean Imperato ACNP Acute Care Nurse Practitioner [facility] Pulmonary/Critical Care Please consult Amion 04/02/2023, 11:38 AM

## 2023-04-02 NOTE — Consult Note (Signed)
I have been asked to see the patient by Dr. Carollee Herter, for evaluation and management of Fournier's gangrene.  History of present illness: 67 year old otherwise healthy male presented to the emergency department today from the nursing facility with blood and purulence from his Foley catheter and urethra.  He is also complaining of some tenderness and swelling around the suprapubic region and scrotum.  The patient states that he has felt pain in soreness in this area for the last 3 weeks.  He stated that he has had some swelling gets drainage around the penis over the last few days.  Has not had any fevers or chills.  In the emergency department it was noted that the catheter was not draining well.  Further, he was having bloody purulent discharge.  A CT scan was thus obtained demonstrated some free air in the subcutaneous space at the base of the patient's penis suprapubic region as well as within the scrotum.  Urology was consulted for further evaluation.  Review of systems: A 12 point comprehensive review of systems was obtained and is negative unless otherwise stated in the history of present illness.  Patient Active Problem List   Diagnosis Date Noted   UTI (urinary tract infection) due to urinary indwelling catheter (HCC) 04/01/2023   S/P BKA (below knee amputation) unilateral, left (HCC) 04/01/2023   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) 04/01/2023   Long term current use of systemic steroids 04/01/2023   Thrombocytopenia (HCC) 10/01/2022   Orthostatic hypotension 09/24/2022   HFrEF (heart failure with reduced ejection fraction) (HCC) 09/23/2022   Counseling and coordination of care 09/15/2022   Pain 09/14/2022   High risk medication use 09/14/2022   Need for emotional support 09/12/2022   Mood altered 09/11/2022   Medication management 09/11/2022   Palliative care by specialist 09/08/2022   Goals of care, counseling/discussion 09/08/2022   Physical  deconditioning 09/05/2022   Gross hematuria 08/31/2022   Type 2 diabetes mellitus with diabetic peripheral angiopathy without gangrene, without long-term current use of insulin (HCC)    Impaired functional mobility, balance, gait, and endurance 08/03/2022   Obstructive sleep apnea syndrome 08/03/2022   Pleural effusion, bilateral 08/03/2022   Catheter-associated urinary tract infection (HCC) 08/03/2022   History of urinary retention 08/03/2022   Mitral valve regurgitation 08/03/2022   Thoracic ascending aortic aneurysm (HCC) 07/23/2022   Borderline low blood pressure determined by examination 07/04/2022   Coronary artery disease due to lipid rich plaque 06/25/2022   Hematuria 06/25/2022   History of non-ST elevation myocardial infarction (NSTEMI) 06/15/2022   PAF (paroxysmal atrial fibrillation) (HCC) 06/15/2022   Dyslipidemia 06/15/2022   PVD (peripheral vascular disease) (HCC) 06/15/2022   Pressure ulcer of sacral region, stage 2 (HCC) 06/15/2022   Chronic HFrEF (heart failure with reduced ejection fraction) (HCC) 06/15/2022   DNR (do not resuscitate) 06/15/2022   Ischemic cardiomyopathy 02/12/2022   Need for assistance with personal care 08/03/2021   Diabetic polyneuropathy associated with type 2 diabetes mellitus (HCC) 07/28/2021   Prolonged QT interval 07/28/2021   Moderate protein-calorie malnutrition (HCC) 06/29/2021   Chronic insomnia 03/31/2021   BPH with obstruction/lower urinary tract symptoms 02/02/2018   Hyperlipidemia 04/15/2016   Vitamin D deficiency 04/09/2016   Essential hypertension 01/25/2016    No current facility-administered medications on file prior to encounter.   Current Outpatient Medications on File Prior to Encounter  Medication Sig Dispense Refill   acetaminophen (TYLENOL) 325 MG tablet Take 2 tablets (650 mg  total) by mouth every 6 (six) hours as needed for mild pain (or Fever >/= 101). 30 tablet 0   Amino Acids-Protein Hydrolys (PRO-STAT AWC) LIQD  Take 30 mLs by mouth 2 (two) times daily.     atorvastatin (LIPITOR) 80 MG tablet Take 80 mg by mouth daily.     B Complex-C (B-COMPLEX WITH VITAMIN C) tablet Take 1 tablet by mouth daily.     clopidogrel (PLAVIX) 75 MG tablet Take 1 tablet (75 mg total) by mouth daily. 30 tablet 0   cyanocobalamin 1000 MCG tablet Take 1 tablet (1,000 mcg total) by mouth daily.     diclofenac Sodium (VOLTAREN) 1 % GEL Apply 2 g topically 2 (two) times daily as needed (pain).     empagliflozin (JARDIANCE) 10 MG TABS tablet Take 10 mg by mouth daily at 12 noon.     feeding supplement (ENSURE ENLIVE / ENSURE PLUS) LIQD Take 237 mLs by mouth 3 (three) times daily between meals. 237 mL 12   ferrous sulfate 325 (65 FE) MG tablet Take 1 tablet (325 mg total) by mouth daily with breakfast.  3   gabapentin (NEURONTIN) 300 MG capsule Take 300 mg by mouth at bedtime.     hydrocortisone (CORTEF) 10 MG tablet Take 1 tablet (10 mg total) by mouth daily.     hydrocortisone (CORTEF) 5 MG tablet Take 1 tablet (5 mg total) by mouth every evening.     melatonin 5 MG TABS Take 1 tablet (5 mg total) by mouth daily at 6 PM.  0   metFORMIN (GLUCOPHAGE) 500 MG tablet Take 1,000 mg by mouth 2 (two) times daily with a meal.     midodrine (PROAMATINE) 5 MG tablet Take 1 tablet (5 mg total) by mouth with breakfast, with lunch, and with evening meal.     nystatin (MYCOSTATIN/NYSTOP) powder Apply topically 2 (two) times daily. 15 g 0   pantoprazole (PROTONIX) 40 MG tablet Take 1 tablet (40 mg total) by mouth daily. 30 tablet 2   potassium chloride SA (KLOR-CON M) 20 MEQ tablet Take 20 mEq by mouth daily.     Propylene Glycol (SYSTANE BALANCE) 0.6 % SOLN Place 1 drop into both eyes daily as needed.     saccharomyces boulardii (FLORASTOR) 250 MG capsule Take 250 mg by mouth 2 (two) times daily.     senna-docusate (SENOKOT-S) 8.6-50 MG tablet Take 1 tablet by mouth 2 (two) times daily.     tamsulosin (FLOMAX) 0.4 MG CAPS capsule Take 0.4 mg by  mouth daily.     apixaban (ELIQUIS) 5 MG TABS tablet Take 1 tablet (5 mg total) by mouth 2 (two) times daily. 60 tablet 0   bisacodyl (DULCOLAX) 10 MG suppository Place 1 suppository (10 mg total) rectally daily as needed for moderate constipation. 12 suppository 0   furosemide (LASIX) 20 MG tablet Take 1 tablet (20 mg total) by mouth daily for 1 day. 30 tablet    oxyCODONE (OXY IR/ROXICODONE) 5 MG immediate release tablet Take 1 tablet (5 mg total) by mouth every 12 (twelve) hours as needed for up to 4 doses for moderate pain, severe pain or breakthrough pain. 4 tablet 0   polyethylene glycol (MIRALAX / GLYCOLAX) 17 g packet Take 17 g by mouth daily as needed for mild constipation. 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 Overview:  Added automatically from request for surgery [id number] Formatting of this note might be different from the original. Added automatically from request for surgery [id number]   Large bowel perforation (HCC) 09/27/2015   Malignant neoplasm of prostate (HCC) 10/22/2019   Myopia with astigmatism and presbyopia, bilateral 06/15/2018   Last Assessment & Plan:  Formatting of this note might be different from the original. - Wrx printed  Formatting of this note might be different from the original. 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 - Stable, monitor Formatting of this note might be different from the original. Last Assessment & Plan:  - Stable, monitor   Retroperitoneal abscess (HCC) 10/02/2015   Status post corneal transplant 10/07/2016   Formatting of this note might be different from the original. Overview:  Added automatically from request for surgery [id number] Formatting of this note might be different from the original. Added automatically from request for surgery [id number]  Last Assessment & Plan:  Formatting of this note might be different from the original. - OD, 2/2 KCN - Stable, no NVK / rejection - Continue off steroids - Mo   Thoracic ascending aortic aneurysm (HCC)    TIA (transient ischemic attack) 2012   Type 2 diabetes mellitus (HCC)     Past Surgical History:  Procedure Laterality Date   BELOW KNEE LEG AMPUTATION Left 2022   CORONARY ANGIOPLASTY WITH STENT PLACEMENT  01/2022   DES RCA   LEFT HEART CATH AND CORONARY ANGIOGRAPHY N/A 06/17/2022   Procedure: LEFT HEART CATH AND CORONARY ANGIOGRAPHY;  Surgeon: Yvonne Kendall, MD;  Location: [facility];  Service: Cardiovascular;  Laterality: N/A;   ROTATOR CUFF REPAIR Left     Social History   Tobacco Use   Smoking status: Never   Smokeless tobacco: Never  Substance Use Topics   Alcohol use: Never   Drug use: Never    Family History  Problem Relation Age of Onset   Diabetes Mother    Hypertension Mother    Heart disease Mother    Cancer Father    Heart attack Sister    Cancer Sister    Diabetes Brother      PE: Vitals:   04/01/23 2032 04/01/23 2033 04/01/23 2136 04/02/23 0045  BP:  (!) 99/53  (!) 99/56  Pulse: 87 88  97  Resp: 17 18  15   Temp:   98.5 F (36.9 C) 98.3 F (36.8 C)  TempSrc:   Oral Oral  SpO2: 97% 99%  97%  Weight:      Height:  Patient appears to be in no acute distress  patient is alert and oriented x3 Atraumatic normocephalic head No cervical or supraclavicular lymphadenopathy appreciated No increased work of breathing, no audible wheezes/rhonchi Regular sinus rhythm/rate Abdomen is soft, nontender, nondistended, no CVA Patient has a very edematous and tender scrotum and suprapubic region.  There is no cellulitic findings. Lower extremities are symmetric without appreciable edema Grossly neurologically intact No identifiable skin lesions  Recent Labs    04/01/23 1817  WBC 12.7*  HGB 9.8*  HCT 33.0*   Recent Labs    04/01/23 1817  NA 131*  K 4.1  CL 96*  CO2 24  GLUCOSE 191*  BUN 32*  CREATININE 1.13  CALCIUM 8.4*   No results for input(s): "LABPT", "INR" in the last 72 hours. No results for input(s): "LABURIN" in the last 72 hours. Results for orders placed or performed during the hospital encounter of 08/31/22  Urine Culture     Status: None   Collection Time: 08/31/22  4:22 PM   Specimen: Urine, Clean Catch  Result Value Ref Range Status   Specimen Description   Final    URINE, CLEAN CATCH Performed at [facility], [address], [place], [place] [address]    Special Requests   Final    NONE Performed at [facility], [address], [place], [place] [address]    Culture   Final    NO GROWTH Performed at [facility], [address], [place], [place] [address]    Report Status 09/02/2022 FINAL  Final  Blood culture (routine x 2)     Status: None   Collection Time: 08/31/22  6:15 PM   Specimen: BLOOD  Result Value Ref Range Status   Specimen Description   Final    BLOOD SITE  NOT SPECIFIED Performed at [facility], [address], [place], [place] [address]    Special Requests   Final    BOTTLES DRAWN AEROBIC AND ANAEROBIC Blood Culture adequate volume Performed at [facility], [address] [place], [place] [address]    Culture   Final    NO GROWTH 5 DAYS Performed at [facility], [address] [place], [place] [address]    Report Status 09/05/2022 FINAL  Final  Blood culture (routine x 2)     Status: None   Collection Time: 08/31/22  6:30 PM   Specimen: BLOOD  Result Value Ref Range Status   Specimen Description   Final    BLOOD SITE NOT SPECIFIED Performed at [facility], [address], [place], [place] [address]    Special Requests   Final    BOTTLES DRAWN AEROBIC AND ANAEROBIC Blood Culture results may not be optimal due to an inadequate volume of blood received in culture bottles Performed at [facility], [address], [place], [place] [address]    Culture   Final    NO GROWTH 5 DAYS Performed at [facility], [address] [place], [place] [address]    Report Status 09/06/2022 FINAL  Final  MRSA Next Gen by PCR, Nasal     Status: None   Collection Time: 09/01/22 12:00 AM   Specimen: Nasal Mucosa; Nasal Swab  Result Value Ref Range Status   MRSA by PCR Next Gen NOT DETECTED NOT DETECTED Final    Comment: (NOTE) The GeneXpert MRSA Assay (FDA approved for NASAL specimens only), is  one component of a comprehensive MRSA colonization surveillance program. It is not intended to diagnose MRSA infection nor to guide or monitor treatment for MRSA infections. Test performance is not FDA approved in patients less than 53 years old. Performed at [facility], [address] [place], [place] [address]     Imaging: I reviewed the patient's CT scan which demonstrates some subcutaneous air in the suprapubic region and the base of  his penis and some fat stranding in the adjacent areas.  Imp: The patient appears to have necrotizing infection suprapubic region the base of his penis.  His exam is fairly benign compared to his CAT scan although he does have some tenderness with palpation.  His vital signs are stable he is otherwise doing reasonably well.  Unfortunately the patient is on Eliquis, he was given his Eliquis yesterday at 7 AM.  Further, the patient ate an hour prior to the consultation.  Recommendations: The patient will need I&D of the area around the base of his penis and scrotum in the bilateral inguinal region.  Went through that with him in detail.  However, at this time because of the patient's n.p.o. status.  Further, the Eliquis certainly will complicate things given that he had it less than 24 hours ago.  Tentatively we have scheduled him for I&D tomorrow afternoon.  Distal given plenty of time to digest the food that is eaten and allow more time for the Eliquis to wear off.  If his condition changes please alert me to that we could try to move him up and get things done more quickly.   Crist Fat

## 2023-04-02 NOTE — Progress Notes (Signed)
  X-cover Note: Have also placed consult for PCCM to evaluate patient.   Carollee Herter, DO [facility]

## 2023-04-02 NOTE — Consult Note (Signed)
WOC Nurse Consult Note: Reason for Consult: Right fifth ray amputation with nonhealing wound.  Right knee with trauma abrasion, with devitalized tissue to base.  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   Wound type: Nonhealing surgical wound.  \Trauma wound to knee Stage 3 pressure injury  Pressure Injury POA: Yes Measurement: Right foot:  5 cm x 4.5 cm unable to assess deepest part of wound bed due to the presence of devitalized tissue.  Wound bed: knee 100% slough, moist Right foot:  30% slough Sacral wound:  Drainage (amount, consistency, odor) minimal serosanguinous   Periwound: dry skin Dressing procedure/placement/frequency:  Cleanse wounds to sacrum, right knee and right foot with VASHE solution (LAWSON # [id number]) and apply VASHE moist gauze to wound bed to debride and promote healing.  Cover with ABD or foam dressing. Change daily.  Will not follow at this time.  Please re-consult if needed.  Mike Gip MSN, RN, FNP-BC CWON Wound, Ostomy, Continence Nurse Outpatient [facility] [phone] Pager [phone]
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
## 2023-04-03 ENCOUNTER — Encounter (HOSPITAL_COMMUNITY): Payer: Self-pay | Admitting: Urology

## 2023-04-03 ENCOUNTER — Inpatient Hospital Stay (HOSPITAL_COMMUNITY): Payer: Medicare Other

## 2023-04-03 LAB — CBC WITH DIFFERENTIAL/PLATELET
Abs Immature Granulocytes: 0.09 10*3/uL — ABNORMAL HIGH (ref 0.00–0.07)
Basophils Absolute: 0.1 10*3/uL (ref 0.0–0.1)
Basophils Relative: 0 %
Eosinophils Absolute: 0.1 10*3/uL (ref 0.0–0.5)
Eosinophils Relative: 1 %
HCT: 29.7 % — ABNORMAL LOW (ref 39.0–52.0)
Hemoglobin: 9 g/dL — ABNORMAL LOW (ref 13.0–17.0)
Immature Granulocytes: 1 %
Lymphocytes Relative: 3 %
Lymphs Abs: 0.5 10*3/uL — ABNORMAL LOW (ref 0.7–4.0)
MCH: 24.2 pg — ABNORMAL LOW (ref 26.0–34.0)
MCHC: 30.3 g/dL (ref 30.0–36.0)
MCV: 79.8 fL — ABNORMAL LOW (ref 80.0–100.0)
Monocytes Absolute: 0.6 10*3/uL (ref 0.1–1.0)
Monocytes Relative: 4 %
Neutro Abs: 13.1 10*3/uL — ABNORMAL HIGH (ref 1.7–7.7)
Neutrophils Relative %: 91 %
Platelets: 276 10*3/uL (ref 150–400)
RBC: 3.72 MIL/uL — ABNORMAL LOW (ref 4.22–5.81)
RDW: 19.1 % — ABNORMAL HIGH (ref 11.5–15.5)
WBC: 14.4 10*3/uL — ABNORMAL HIGH (ref 4.0–10.5)
nRBC: 0 % (ref 0.0–0.2)

## 2023-04-03 LAB — MAGNESIUM: Magnesium: 1.5 mg/dL — ABNORMAL LOW (ref 1.7–2.4)

## 2023-04-03 LAB — BASIC METABOLIC PANEL
Anion gap: 10 (ref 5–15)
BUN: 31 mg/dL — ABNORMAL HIGH (ref 8–23)
CO2: 22 mmol/L (ref 22–32)
Calcium: 7.8 mg/dL — ABNORMAL LOW (ref 8.9–10.3)
Chloride: 97 mmol/L — ABNORMAL LOW (ref 98–111)
Creatinine, Ser: 1.34 mg/dL — ABNORMAL HIGH (ref 0.61–1.24)
GFR, Estimated: 58 mL/min — ABNORMAL LOW (ref >60)
Glucose, Bld: 231 mg/dL — ABNORMAL HIGH (ref 70–99)
Potassium: 3.9 mmol/L (ref 3.5–5.1)
Sodium: 129 mmol/L — ABNORMAL LOW (ref 135–145)

## 2023-04-03 LAB — CBC
HCT: 29.7 % — ABNORMAL LOW (ref 39.0–52.0)
Hemoglobin: 9 g/dL — ABNORMAL LOW (ref 13.0–17.0)
MCH: 24.5 pg — ABNORMAL LOW (ref 26.0–34.0)
MCHC: 30.3 g/dL (ref 30.0–36.0)
MCV: 80.7 fL (ref 80.0–100.0)
Platelets: 297 10*3/uL (ref 150–400)
RBC: 3.68 MIL/uL — ABNORMAL LOW (ref 4.22–5.81)
RDW: 19.1 % — ABNORMAL HIGH (ref 11.5–15.5)
WBC: 17.5 10*3/uL — ABNORMAL HIGH (ref 4.0–10.5)
nRBC: 0 % (ref 0.0–0.2)

## 2023-04-03 LAB — COMPREHENSIVE METABOLIC PANEL
ALT: 17 U/L (ref 0–44)
AST: 22 U/L (ref 15–41)
Albumin: 1.7 g/dL — ABNORMAL LOW (ref 3.5–5.0)
Alkaline Phosphatase: 69 U/L (ref 38–126)
Anion gap: 10 (ref 5–15)
BUN: 32 mg/dL — ABNORMAL HIGH (ref 8–23)
CO2: 20 mmol/L — ABNORMAL LOW (ref 22–32)
Calcium: 7.6 mg/dL — ABNORMAL LOW (ref 8.9–10.3)
Chloride: 100 mmol/L (ref 98–111)
Creatinine, Ser: 0.88 mg/dL (ref 0.61–1.24)
GFR, Estimated: >60 mL/min (ref >60)
Glucose, Bld: 170 mg/dL — ABNORMAL HIGH (ref 70–99)
Potassium: 3.8 mmol/L (ref 3.5–5.1)
Sodium: 130 mmol/L — ABNORMAL LOW (ref 135–145)
Total Bilirubin: 0.8 mg/dL (ref 0.3–1.2)
Total Protein: 5.1 g/dL — ABNORMAL LOW (ref 6.5–8.1)

## 2023-04-03 LAB — GLUCOSE, CAPILLARY
Glucose-Capillary: 144 mg/dL — ABNORMAL HIGH (ref 70–99)
Glucose-Capillary: 233 mg/dL — ABNORMAL HIGH (ref 70–99)
Glucose-Capillary: 206 mg/dL — ABNORMAL HIGH (ref 70–99)
Glucose-Capillary: 225 mg/dL — ABNORMAL HIGH (ref 70–99)
Glucose-Capillary: 275 mg/dL — ABNORMAL HIGH (ref 70–99)

## 2023-04-03 LAB — LACTIC ACID, PLASMA
Lactic Acid, Venous: 1.6 mmol/L (ref 0.5–1.9)
Lactic Acid, Venous: 1.9 mmol/L (ref 0.5–1.9)

## 2023-04-03 LAB — PHOSPHORUS: Phosphorus: 3.7 mg/dL (ref 2.5–4.6)

## 2023-04-03 MED ORDER — VANCOMYCIN HCL IN DEXTROSE 1-5 GM/200ML-% IV SOLN
1000.0000 mg | Freq: Two times a day (BID) | INTRAVENOUS | Status: DC
  Administered 2023-04-04: 1000 mg via INTRAVENOUS
  Filled 2023-04-03: qty 200

## 2023-04-03 MED ORDER — HYDROMORPHONE HCL 1 MG/ML IJ SOLN
0.5000 mg | INTRAMUSCULAR | Status: AC | PRN
  Administered 2023-04-03 – 2023-04-08 (×29): 0.5 mg via INTRAVENOUS
  Filled 2023-04-03 (×31): qty 0.5

## 2023-04-03 MED ORDER — ENSURE MAX PROTEIN PO LIQD
11.0000 [oz_av] | Freq: Two times a day (BID) | ORAL | Status: DC
  Administered 2023-04-03 – 2023-04-10 (×13): 11 [oz_av] via ORAL
  Filled 2023-04-03 (×19): qty 330

## 2023-04-03 MED ORDER — JUVEN PO PACK
1.0000 | PACK | Freq: Two times a day (BID) | ORAL | Status: DC
  Administered 2023-04-03 – 2023-04-11 (×13): 1 via ORAL
  Filled 2023-04-03 (×18): qty 1

## 2023-04-03 MED ORDER — MAGNESIUM SULFATE 4 GM/100ML IV SOLN
4.0000 g | Freq: Once | INTRAVENOUS | Status: AC
  Administered 2023-04-03: 4 g via INTRAVENOUS
  Filled 2023-04-03: qty 100

## 2023-04-03 MED ORDER — LACTATED RINGERS IV BOLUS: 250.0000 mL | Freq: Once | INTRAVENOUS | Status: DC

## 2023-04-03 MED ORDER — HYDROCORTISONE SOD SUC (PF) 100 MG IJ SOLR
100.0000 mg | Freq: Two times a day (BID) | INTRAMUSCULAR | Status: DC
  Administered 2023-04-03 – 2023-04-04 (×3): 100 mg via INTRAVENOUS
  Filled 2023-04-03 (×4): qty 2

## 2023-04-03 MED ORDER — CHLORHEXIDINE GLUCONATE CLOTH 2 % EX PADS
6.0000 | MEDICATED_PAD | Freq: Every day | CUTANEOUS | Status: DC
  Administered 2023-04-03 – 2023-04-18 (×16): 6 via TOPICAL

## 2023-04-03 MED ORDER — SODIUM CHLORIDE 0.9 % IV BOLUS: 1000.0000 mL | Freq: Once | INTRAVENOUS | Status: DC

## 2023-04-03 MED ORDER — VANCOMYCIN HCL 1500 MG/300ML IV SOLN
1500.0000 mg | Freq: Two times a day (BID) | INTRAVENOUS | Status: DC
  Administered 2023-04-03: 1500 mg via INTRAVENOUS
  Filled 2023-04-03: qty 300

## 2023-04-03 MED ORDER — OXYCODONE HCL 5 MG PO TABS
5.0000 mg | ORAL_TABLET | ORAL | Status: DC | PRN
  Administered 2023-04-04 – 2023-04-18 (×49): 5 mg via ORAL
  Filled 2023-04-03 (×51): qty 1

## 2023-04-03 NOTE — Progress Notes (Signed)
Approximately 1605 bedside RN notified of BP of 86/59 (69). Patient asymptomatic. 1645 - bedside RN with patient, orientation at baseline, patient flat for approximately 5 min, BP 77/62 (52), HR 61. Patient denies dizzy/lightheadedness. MD Lacretia Nicks was notified. Pt put in reverse trendelenburg.  1650 - BP 80/56 (65), HR 77. 1657 - MD Powell at bedside, bolus ordered.When RN started bolus, PIV immediately started leaking. RN switched bolus to other PIV which also immediately started leaking. Bedside RN attempted to place new PIV twice, was unsuccessful.  1725 - BP 77/53 (62), HR 78. Patient still alert, denies dizzy/lightheadedness. 1730 - STAT IV team order placed.  1735 - STAT labs ordered. Additional bedside RN came to attempt PIV, unsuccessful with 2 attempts.  1750 - IV nurse and lab at bedside. Lab unable to collect blood. 1810 - IV nurse able to collect green top, unable to place PIV. BP 96/54 (65), HR 85. 1830 - IV nurse successfully placed new PIV, BMP and lactic collected and sent to lab. Patient leveled out in bed. 1837 - BP 77/55 (61), HR 84.  1840 - XRAY in room, sat patient up for xray, then put back flat when done. 1843 - BP 88/61 (71), HR 85. 1850 - Bedside RN notified of new bed, 4 [address] 26.

## 2023-04-03 NOTE — Progress Notes (Signed)
PROGRESS NOTE    [facility]  OZH:[id number] DOB: Nov 12, 1955 DOA: 04/01/2023 PCP: Peter Brod, MD  Chief Complaint  Patient presents with   Urinary Retention    Brief Narrative:   67 year old male with [facility] history of left below-knee amputation, chronic systolic heart failure, history of [facility], recent admission in April 2024 at [facility] for right diabetic foot ulcer status post fifth ray amputation, hypertension, type 2 diabetes who presents to the ER today from [facility] due to pus and blood from his Foley catheter.  Patient had acute urinary tension on Saturday, June 8.  He had [facility] Foley catheter placed at that time.  He started developing pain in his penis yesterday.  He was sent to the ER for evaluation.   Arrival temp 98.4 heart rate 100 blood pressure 95/66   White count 12.7, hemoglobin 9.8, platelets of 277   Sodium 131, potassium 4.1, bicarb 24, BUN of 32, creatinine 1.1, glucose 191   UA showed turbid colored urine.  Greater than 50 RBCs.   [facility] contacted for admission.   Upon review of his chart, patient had been [facility] back in October 2023 where he was treated with ertapenem for [facility] ESBL E. coli UTI.  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   Gross hematuria Due to foley? This also seems to be known, history of this Eliquis currently on hold, will resume post op when ok with urology Urine culture with no growth     Type 2 diabetes mellitus with diabetic peripheral angiopathy without gangrene, without long-term current use of insulin (HCC) Continue with SSI and lantus.   History of urinary retention Foley catheter placed on Saturday 03-29-2023. Start flomax   DNR (do not resuscitate) Verified with pt that he is [name] DNR/DNI.  Chronic HFrEF (heart failure with reduced ejection fraction) (HCC) Stable.   PAF (paroxysmal atrial fibrillation) (HCC) Stable.   Essential hypertension Stable. Has hx of orthostatic hypotension.   Long term current use of systemic steroids Pt remains on cortef bid. Unclear why he is still on cortef.  Appears to have started in October/November 2023. Stress dose  steroids   Prostate Cancer Follow outpatient with urology     DVT prophylaxis: SCD Code Status: DNR Family Communication: none Disposition:   Status is: Inpatient Remains inpatient appropriate because: need for continued care   Consultants:  Urology PCCM  Procedures:  Incision and drainage of infection scrotal with some tissue debridement   Antimicrobials:  Anti-infectives  (From admission, onward)    Start     Dose/Rate Route Frequency Ordered Stop   04/02/23 1400  vancomycin (VANCOCIN) IVPB 1000 mg/200 mL premix        1,000 mg 200 mL/hr over 60 Minutes Intravenous Every 12 hours 04/02/23 0036     04/02/23 0115  vancomycin (VANCOREADY) IVPB 2000 mg/400 mL        2,000 mg 200 mL/hr over 120 Minutes Intravenous  Once 04/02/23 0015 04/02/23 0529   04/02/23 0100  clindamycin (CLEOCIN) IVPB 900 mg  Status:  Discontinued        900 mg 100 mL/hr over 30 Minutes Intravenous Every 8 hours 04/02/23 0002 04/02/23 0005   04/02/23 0100  clindamycin (CLEOCIN) IVPB 900 mg        900 mg 100 mL/hr over 30 Minutes Intravenous Every 8 hours 04/02/23 0012 04/05/23 0159   04/02/23 0000  meropenem (MERREM) 1 g in sodium chloride 0.9 % 100 mL IVPB        1 g 200 mL/hr over 30 Minutes Intravenous Every 8 hours 04/01/23 2352     04/01/23 2215  meropenem (MERREM) 2 g in sodium chloride 0.9 % 100 mL IVPB  Status:  Discontinued        2 g 280 mL/hr over 30 Minutes Intravenous  Once 04/01/23 2221 04/01/23 2352   04/01/23 1800  cefTRIAXone (ROCEPHIN) 1 g in sodium chloride 0.9 % 100 mL IVPB        1 g 200 mL/hr over 30 Minutes Intravenous  Once 04/01/23 1746 04/01/23 1902       Subjective: C/o groin pain   Objective: Vitals:   04/02/23 1956 04/03/23 0021 04/03/23 0429 04/03/23 0751  BP: 96/69 (!) 116/100 (!) 97/58 (!) 87/56  Pulse: 88 (!) 109 88 82  Resp: 17 17 18 18   Temp: 97.6 F (36.4 C) (!) 97.5 F (36.4 C) 99.7 F (37.6 C) 98.6 F (37 C)  TempSrc:      SpO2: 97% 95% 94% 98%  Weight:      Height:        Intake/Output Summary (Last 24 hours) at 04/03/2023 0825 Last data filed at 04/03/2023 0520 Gross per 24 hour  Intake 2751.52 ml  Output 1125 ml  Net 1626.52 ml   Filed Weights   04/01/23 1648 04/02/23 1324  Weight: 118.1 kg 102.1 kg    Examination:  General: No acute distress. Cardiovascular: RRR Lungs: unlabbored Abdomen: Soft, nontender,  nondistended Dressing to groin, blood soaked Neurological: Alert and oriented 3. Moves all extremities 4 with equal strength. Cranial nerves II through XII grossly intact. Skin: Warm and dry. No rashes or lesions. Extremities: dressing to RLE    Data Reviewed: I have personally reviewed following labs and imaging studies  CBC: Recent Labs  Lab 04/01/23 1817 04/02/23 0318 04/03/23 0434  WBC 12.7* 11.6* 14.4*  NEUTROABS 10.1* 8.8* 13.1*  HGB 9.8* 9.5* 9.0*  HCT 33.0* 31.0* 29.7*  MCV 80.7 77.5* 79.8*  PLT 277 271 276    Basic Metabolic Panel: Recent Labs  Lab 04/01/23 1817 04/02/23 0318 04/03/23 0434  NA 131* 131* 130*  K 4.1 3.7 3.8  CL 96* 98 100  CO2 24 23 20*  GLUCOSE 191* 174* 170*  BUN 32* 30* 32*  CREATININE 1.13 1.01 0.88  CALCIUM 8.4* 8.1* 7.6*  MG  --   --  1.5*  PHOS  --   --  3.7    GFR: Estimated Creatinine Clearance: 101.4 mL/min (by C-G formula based on SCr of 0.88 mg/dL).  Liver Function Tests: Recent Labs  Lab 04/02/23 0318 04/03/23 0434  AST 21 22  ALT 18 17  ALKPHOS 78 69  BILITOT 0.4 0.8  PROT 5.5* 5.1*  ALBUMIN 1.8* 1.7*    CBG: Recent Labs  Lab 04/02/23 1505 04/02/23 1803 04/02/23 1959 04/03/23 0430 04/03/23 0746  GLUCAP 133* 158* 184* 144* 233*     Recent Results (from the past 240 hour(s))  Urine Culture     Status: None   Collection Time: 04/01/23  5:46 PM   Specimen: Urine, Catheterized  Result Value Ref Range Status   Specimen Description URINE, CATHETERIZED  Final   Special Requests NONE  Final   Culture   Final    NO GROWTH Performed at [facility], [address], [place], [place] [address]    Report Status 04/02/2023 FINAL  Final  MRSA Next Gen by PCR, Nasal     Status: Abnormal   Collection Time: 04/02/23  1:00 AM   Specimen: Nasal Mucosa; Nasal Swab  Result Value Ref Range Status   MRSA by PCR Next Gen DETECTED (Khayden Herzberg) NOT DETECTED Final    Comment: RESULT CALLED TO, READ BACK BY AND VERIFIED  WITH: RN KAITLYN BLUCHER ON 04/02/23 @ 1515 BY DRT (NOTE) The GeneXpert MRSA Assay (FDA approved for NASAL specimens only), is one component of [facility] Huebert comprehensive MRSA colonization surveillance program. It is not intended to diagnose MRSA infection nor to guide or monitor treatment for MRSA infections. Test performance is not FDA approved in patients less than 86 years old. Performed at [facility], [address] [place], [place] [address]          Radiology Studies: CT Renal Stone Study  Result Date: 04/01/2023 CLINICAL DATA:  Bladder diverticulum EXAM: CT ABDOMEN AND PELVIS WITHOUT CONTRAST TECHNIQUE: Multidetector CT imaging of the abdomen and pelvis was performed following the standard protocol without IV contrast. RADIATION DOSE REDUCTION: This exam was performed according to the departmental dose-optimization program which includes automated exposure control, adjustment of the mA and/or kV according to patient size and/or use of iterative reconstruction technique. COMPARISON:  01/22/2023 FINDINGS: Lower chest: Moderate bilateral pleural effusions. Compressive atelectasis in the lower lobes. Diffuse calcifications in the visualized right coronary artery. Moderate aortic calcifications. Hepatobiliary: Layering gallstones within the gallbladder. No focal hepatic abnormality. Pancreas: No focal abnormality or ductal dilatation. Spleen: No focal abnormality.  Normal size. Adrenals/Urinary Tract: Adrenal glands normal. Mild left hydronephrosis, similar prior study. Mild caliectasis on the right. Right ureters decompressed. Foley catheter is present within the bladder which is decompressed. There appears to be mild bladder wall thickening. Stomach/Bowel: Stomach, large and small bowel grossly unremarkable. Vascular/Lymphatic: Diffuse aortoiliac atherosclerosis. No evidence of aneurysm or adenopathy. Reproductive: There is gas noted within the subcutaneous soft tissues surrounding the base of  the penis and extending in the lower anterior abdominal wall concerning for Fournier's gangrene. Diffuse edema noted throughout the scrotal wall. Other: Diffuse anasarca/edema throughout the abdominal wall and pelvis. Musculoskeletal: No acute bony abnormality. IMPRESSION: Gas and soft tissue stranding noted within the subcutaneous  soft tissues surrounding the base of the penis and extending superiorly in the anterior abdominal wall concerning for Fournier's gangrene. Diffuse edema throughout the scrotal wall. Bladder is decompressed with Foley catheter present, but appears thick walled. This is associated with mild left hydronephrosis and right renal pelvicaliectasis. These findings are similar to prior study. Cholelithiasis. Coronary artery disease, aortic atherosclerosis. Moderate bilateral pleural effusions, bibasilar atelectasis. Electronically Signed   By: Charlett Nose M.D.   On: 04/01/2023 23:54        Scheduled Meds:  atorvastatin  80 mg Oral Daily   gabapentin  300 mg Oral QHS   hydrocortisone sod succinate (SOLU-CORTEF) inj  100 mg Intravenous BID   insulin aspart  0-15 Units Subcutaneous TID WC   insulin aspart  0-5 Units Subcutaneous QHS   insulin glargine-yfgn  15 Units Subcutaneous QHS   metoprolol succinate  12.5 mg Oral Daily   pantoprazole  40 mg Oral Daily   polyethylene glycol  17 g Oral BID   Continuous Infusions:  sodium chloride 100 mL/hr at 04/03/23 0520   clindamycin (CLEOCIN) IV Stopped (04/03/23 0103)   meropenem (MERREM) IV 1 g (04/03/23 2130)   vancomycin Stopped (04/03/23 0336)     LOS: 1 day    Time spent: over 30 min    Lacretia Nicks, MD [facility]   To contact the attending provider between 7A-7P or the covering provider during after hours 7P-7A, please log into the web site [email or web address] and access using universal [place] password for that web site. If you do not have the password, please call the [facility] operator.  04/03/2023, 8:25  AM

## 2023-04-03 NOTE — Progress Notes (Signed)
Pharmacy Antibiotic Note  [facility] is a 67 y.o. male for which pharmacy has been consulted for meropenem dosing for UTI. Patient comes from [facility] for evaluation of discolored urine and hematuria. He had an episode of urinary retention over the weekend on Saturday and foley catheter was placed. Today, staff noticed purulence and blood and was sent to ED for further evaluation.  Patient with a history of ESBL e. Coli and proteus in urine culture that was treated with IV carbapenem followed by 6 weeks of Bactrim. Patient urine culture unable to be located.  SCr 0.88 (bl~0.9-1) WBC 14.4; afebrile  S/p I&D on 6/12. Change vanc to be managed per Rx.   Plan: Meropenem 1g IV every 8 hours Change vanc to 1.5g IV q12>>AUC 461, scr 0.88 Levels if needed   Height: 6\' 4"  (193 cm) Weight: 102.1 kg (225 lb) IBW/kg (Calculated) : 86.8  Temp (24hrs), Avg:98.1 F (36.7 C), Min:97.3 F (36.3 C), Max:99.7 F (37.6 C)  Recent Labs  Lab 04/01/23 1817 04/02/23 0318 04/03/23 0434  WBC 12.7* 11.6* 14.4*  CREATININE 1.13 1.01 0.88     Estimated Creatinine Clearance: 101.4 mL/min (by C-G formula based on SCr of 0.88 mg/dL).    Allergies  Allergen Reactions   Sglt2 Inhibitors     Fournier's gangrene    Antimicrobials this admission: Meropenem 6/11>> Vanc 6/12>> Clinda 6/12>>6/14   Microbiology results: 07/2022 Ucx: ESBL e. Coli + proteus (I-imipenem), not to meorpenem/ertapenem 6/11 Ucx: neg MRSA+  [facility], PharmD, BCIDP, AAHIVP, CPP Infectious Disease Pharmacist 04/03/2023 1:07 PM

## 2023-04-03 NOTE — Progress Notes (Signed)
Initial Nutrition Assessment  DOCUMENTATION CODES:   Not applicable  INTERVENTION:  - Add -1 packet Juven BID, each packet provides 95 calories, 2.5 grams of protein (collagen), and 9.8 grams of carbohydrate (3 grams sugar); also contains 7 grams of L-arginine and L-glutamine, 300 mg vitamin C, 15 mg vitamin E, 1.2 mcg vitamin B-12, 9.5 mg zinc, 200 mg calcium, and 1.5 g  Calcium Beta-hydroxy-Beta-methylbutyrate to support wound healing  - Add Ensure Max po BID, each supplement provides 150 kcal and 30 grams of protein.   NUTRITION DIAGNOSIS:   Increased nutrient needs related to wound healing as evidenced by estimated needs.  GOAL:   Patient will meet greater than or equal to 90% of their needs  MONITOR:   PO intake, Supplement acceptance  REASON FOR ASSESSMENT:   Malnutrition Screening Tool    ASSESSMENT:   67 y.o. male admits related to urinary retention. PMH includes: L BKA, chronic systolic heart failure, HTN, T2DM. Pt is currently receiving medical management related to UTI.  Meds reviewed: lipitor, sliding scale insulin, semglee, miralax. Labs reviewed: Na low, BUN elevated, Mag low.     The pt reports that he has a great appetite but has not had a solid meal since admission. RD will add Ensure Max BID for now. No significant wt loss per record. Pt has a stage 2 wound. RD will add Juven BID. Will continue to monitor PO intakes.   NUTRITION - FOCUSED PHYSICAL EXAM:  Flowsheet Row Most Recent Value  Orbital Region No depletion  Upper Arm Region No depletion  Thoracic and Lumbar Region No depletion  Buccal Region No depletion  Temple Region Moderate depletion  Clavicle Bone Region Mild depletion  Clavicle and Acromion Bone Region No depletion  Scapular Bone Region No depletion  Dorsal Hand No depletion  Patellar Region No depletion  Anterior Thigh Region No depletion  Posterior Calf Region No depletion  Edema (RD Assessment) Mild  Hair Reviewed  Eyes Reviewed   Mouth Reviewed  Skin Reviewed  Nails Reviewed       Diet Order:   Diet Order             Diet Carb Modified Fluid consistency: Thin; Room service appropriate? Yes  Diet effective now                   EDUCATION NEEDS:   Not appropriate for education at this time  Skin:  Skin Assessment: Skin Integrity Issues: Skin Integrity Issues:: Stage II Stage II: Mid buttocks  Last BM:  6/11  Height:   Ht Readings from Last 1 Encounters:  04/02/23 6\' 4"  (1.93 m)    Weight:   Wt Readings from Last 1 Encounters:  04/02/23 102.1 kg    Ideal Body Weight:     BMI:  Body mass index is 27.39 kg/m.  Estimated Nutritional Needs:   Kcal:  2500-3000 kcals  Protein:  125-150 gm  Fluid:  2.5-3 L or per MD (pt with CHF)  Ronica Vivian  Bing, RD, LDN, CNSC.

## 2023-04-03 NOTE — Plan of Care (Signed)

## 2023-04-03 NOTE — Progress Notes (Signed)
1 Day Post-Op Subjective: 6/13: NAEON. Pt resting on arrival and pain was well controlled. Tolerated wound care with some difficulty despite premedication.   Objective: Vital signs in last 24 hours: Temp:  [97.3 F (36.3 C)-99.7 F (37.6 C)] 98.6 F (37 C) (06/13 0751) Pulse Rate:  [79-109] 79 (06/13 0844) Resp:  [11-18] 18 (06/13 0751) BP: (80-116)/(48-100) 94/59 (06/13 0844) SpO2:  [94 %-98 %] 98 % (06/13 0751) Weight:  [102.1 kg] 102.1 kg (06/12 1324)  Intake/Output from previous day: 06/12 0701 - 06/13 0700 In: 2751.5 [P.O.:480; I.V.:1508.2; IV Piggyback:763.3] Out: 1575 [Urine:1575]  Intake/Output this shift: Total I/O In: 354 [P.O.:354] Out: 1200 [Urine:1200]  Physical Exam:  General: Alert and oriented CV: No cyanosis Lungs: equal chest rise Abdomen: Soft, NTND, no rebound or guarding Gu: Foley catheter in place draining cloudy yellow urine.   Lab Results: Recent Labs    04/01/23 1817 04/02/23 0318 04/03/23 0434  HGB 9.8* 9.5* 9.0*  HCT 33.0* 31.0* 29.7*   BMET Recent Labs    04/02/23 0318 04/03/23 0434  NA 131* 130*  K 3.7 3.8  CL 98 100  CO2 23 20*  GLUCOSE 174* 170*  BUN 30* 32*  CREATININE 1.01 0.88  CALCIUM 8.1* 7.6*     Studies/Results: CT Renal Stone Study  Result Date: 04/01/2023 CLINICAL DATA:  Bladder diverticulum EXAM: CT ABDOMEN AND PELVIS WITHOUT CONTRAST TECHNIQUE: Multidetector CT imaging of the abdomen and pelvis was performed following the standard protocol without IV contrast. RADIATION DOSE REDUCTION: This exam was performed according to the departmental dose-optimization program which includes automated exposure control, adjustment of the mA and/or kV according to patient size and/or use of iterative reconstruction technique. COMPARISON:  01/22/2023 FINDINGS: Lower chest: Moderate bilateral pleural effusions. Compressive atelectasis in the lower lobes. Diffuse calcifications in the visualized right coronary artery. Moderate  aortic calcifications. Hepatobiliary: Layering gallstones within the gallbladder. No focal hepatic abnormality. Pancreas: No focal abnormality or ductal dilatation. Spleen: No focal abnormality.  Normal size. Adrenals/Urinary Tract: Adrenal glands normal. Mild left hydronephrosis, similar prior study. Mild caliectasis on the right. Right ureters decompressed. Foley catheter is present within the bladder which is decompressed. There appears to be mild bladder wall thickening. Stomach/Bowel: Stomach, large and small bowel grossly unremarkable. Vascular/Lymphatic: Diffuse aortoiliac atherosclerosis. No evidence of aneurysm or adenopathy. Reproductive: There is gas noted within the subcutaneous soft tissues surrounding the base of the penis and extending in the lower anterior abdominal wall concerning for Fournier's gangrene. Diffuse edema noted throughout the scrotal wall. Other: Diffuse anasarca/edema throughout the abdominal wall and pelvis. Musculoskeletal: No acute bony abnormality. IMPRESSION: Gas and soft tissue stranding noted within the subcutaneous soft tissues surrounding the base of the penis and extending superiorly in the anterior abdominal wall concerning for Fournier's gangrene. Diffuse edema throughout the scrotal wall. Bladder is decompressed with Foley catheter present, but appears thick walled. This is associated with mild left hydronephrosis and right renal pelvicaliectasis. These findings are similar to prior study. Cholelithiasis. Coronary artery disease, aortic atherosclerosis. Moderate bilateral pleural effusions, bibasilar atelectasis. Electronically Signed   By: Charlett Nose M.D.   On: 04/01/2023 23:54    Assessment/Plan: Daily wet to dry dressing changes. Completed with nursing today. Odd distribution of Fournier's, but thankfully wounds that are good for packing and likely to stay clean. He should recover nicely.   Leukocytosis up overnight, acceptable POD#1. Monitor daily labs.    Broad ABX per primary- Merrem/Clinda  Will follow along with you.    LOS:  1 day   Elmon Kirschner, NP [facility] Pager: [phone]  04/03/2023, 1:02 PM

## 2023-04-03 NOTE — Anesthesia Postprocedure Evaluation (Signed)
Anesthesia Post Note  Patient: Jules Baty  Procedure(s) Performed: IRRIGATION AND DEBRIDEMENT ABSCESS (Perineum)     Patient location during evaluation: PACU Anesthesia Type: General Level of consciousness: awake and alert Pain management: pain level controlled Vital Signs Assessment: post-procedure vital signs reviewed and stable Respiratory status: spontaneous breathing, nonlabored ventilation, respiratory function stable and patient connected to nasal cannula oxygen Cardiovascular status: blood pressure returned to baseline and stable Postop Assessment: no apparent nausea or vomiting Anesthetic complications: no   No notable events documented.  Last Vitals:  Vitals:   04/03/23 0021 04/03/23 0429  BP: (!) 116/100 (!) 97/58  Pulse: (!) 109 88  Resp: 17 18  Temp: (!) 36.4 C 37.6 C  SpO2: 95% 94%    Last Pain:  Vitals:   04/02/23 1935  TempSrc:   PainSc: Asleep                 Erick Oxendine S

## 2023-04-04 ENCOUNTER — Other Ambulatory Visit: Payer: Self-pay | Admitting: Urology

## 2023-04-04 ENCOUNTER — Inpatient Hospital Stay (HOSPITAL_COMMUNITY): Payer: Medicare Other

## 2023-04-04 DIAGNOSIS — I5042 Chronic combined systolic (congestive) and diastolic (congestive) heart failure: Secondary | ICD-10-CM

## 2023-04-04 DIAGNOSIS — I25118 Atherosclerotic heart disease of native coronary artery with other forms of angina pectoris: Secondary | ICD-10-CM

## 2023-04-04 LAB — GLUCOSE, CAPILLARY
Glucose-Capillary: 255 mg/dL — ABNORMAL HIGH (ref 70–99)
Glucose-Capillary: 265 mg/dL — ABNORMAL HIGH (ref 70–99)
Glucose-Capillary: 324 mg/dL — ABNORMAL HIGH (ref 70–99)
Glucose-Capillary: 241 mg/dL — ABNORMAL HIGH (ref 70–99)

## 2023-04-04 LAB — BRAIN NATRIURETIC PEPTIDE: B Natriuretic Peptide: 2487.5 pg/mL — ABNORMAL HIGH (ref 0.0–100.0)

## 2023-04-04 LAB — CBC WITH DIFFERENTIAL/PLATELET
Abs Immature Granulocytes: 0.14 10*3/uL — ABNORMAL HIGH (ref 0.00–0.07)
Basophils Absolute: 0 10*3/uL (ref 0.0–0.1)
Basophils Relative: 0 %
Eosinophils Absolute: 0 10*3/uL (ref 0.0–0.5)
Eosinophils Relative: 0 %
HCT: 29 % — ABNORMAL LOW (ref 39.0–52.0)
Hemoglobin: 8.6 g/dL — ABNORMAL LOW (ref 13.0–17.0)
Immature Granulocytes: 1 %
Lymphocytes Relative: 3 %
Lymphs Abs: 0.5 10*3/uL — ABNORMAL LOW (ref 0.7–4.0)
MCH: 23.4 pg — ABNORMAL LOW (ref 26.0–34.0)
MCHC: 29.7 g/dL — ABNORMAL LOW (ref 30.0–36.0)
MCV: 79 fL — ABNORMAL LOW (ref 80.0–100.0)
Monocytes Absolute: 0.6 10*3/uL (ref 0.1–1.0)
Monocytes Relative: 4 %
Neutro Abs: 14.2 10*3/uL — ABNORMAL HIGH (ref 1.7–7.7)
Neutrophils Relative %: 92 %
Platelets: 268 10*3/uL (ref 150–400)
RBC: 3.67 MIL/uL — ABNORMAL LOW (ref 4.22–5.81)
RDW: 19 % — ABNORMAL HIGH (ref 11.5–15.5)
WBC: 15.5 10*3/uL — ABNORMAL HIGH (ref 4.0–10.5)
nRBC: 0 % (ref 0.0–0.2)

## 2023-04-04 LAB — COMPREHENSIVE METABOLIC PANEL
ALT: 16 U/L (ref 0–44)
AST: 21 U/L (ref 15–41)
Albumin: 1.6 g/dL — ABNORMAL LOW (ref 3.5–5.0)
Alkaline Phosphatase: 70 U/L (ref 38–126)
Anion gap: 9 (ref 5–15)
BUN: 35 mg/dL — ABNORMAL HIGH (ref 8–23)
CO2: 20 mmol/L — ABNORMAL LOW (ref 22–32)
Calcium: 7.8 mg/dL — ABNORMAL LOW (ref 8.9–10.3)
Chloride: 98 mmol/L (ref 98–111)
Creatinine, Ser: 1.25 mg/dL — ABNORMAL HIGH (ref 0.61–1.24)
GFR, Estimated: >60 mL/min (ref >60)
Glucose, Bld: 258 mg/dL — ABNORMAL HIGH (ref 70–99)
Potassium: 4 mmol/L (ref 3.5–5.1)
Sodium: 127 mmol/L — ABNORMAL LOW (ref 135–145)
Total Bilirubin: 0.7 mg/dL (ref 0.3–1.2)
Total Protein: 5.1 g/dL — ABNORMAL LOW (ref 6.5–8.1)

## 2023-04-04 LAB — PHOSPHORUS: Phosphorus: 3.6 mg/dL (ref 2.5–4.6)

## 2023-04-04 LAB — MAGNESIUM: Magnesium: 2.3 mg/dL (ref 1.7–2.4)

## 2023-04-04 MED ORDER — HYDROCORTISONE SOD SUC (PF) 100 MG IJ SOLR
50.0000 mg | Freq: Two times a day (BID) | INTRAMUSCULAR | Status: DC
  Filled 2023-04-04: qty 1

## 2023-04-04 MED ORDER — ALBUMIN HUMAN 25 % IV SOLN
25.0000 g | Freq: Once | INTRAVENOUS | Status: AC
  Administered 2023-04-04: 25 g via INTRAVENOUS
  Filled 2023-04-04: qty 100

## 2023-04-04 MED ORDER — HYDROCORTISONE 10 MG PO TABS
10.0000 mg | ORAL_TABLET | Freq: Every day | ORAL | Status: DC
  Administered 2023-04-07 – 2023-04-09 (×3): 10 mg via ORAL
  Filled 2023-04-04 (×3): qty 1

## 2023-04-04 MED ORDER — GADOBUTROL 1 MMOL/ML IV SOLN
10.0000 mL | Freq: Once | INTRAVENOUS | Status: AC | PRN
  Administered 2023-04-04: 10 mL via INTRAVENOUS

## 2023-04-04 MED ORDER — ALBUMIN HUMAN 25 % IV SOLN
INTRAVENOUS | Status: AC
  Administered 2023-04-04: 12.5 g
  Filled 2023-04-04: qty 50

## 2023-04-04 MED ORDER — LINEZOLID 600 MG/300ML IV SOLN
600.0000 mg | Freq: Two times a day (BID) | INTRAVENOUS | Status: DC
  Administered 2023-04-04 – 2023-04-10 (×13): 600 mg via INTRAVENOUS
  Filled 2023-04-04 (×15): qty 300

## 2023-04-04 MED ORDER — FUROSEMIDE 10 MG/ML IJ SOLN
40.0000 mg | Freq: Once | INTRAMUSCULAR | Status: AC
  Administered 2023-04-04: 40 mg via INTRAVENOUS
  Filled 2023-04-04: qty 4

## 2023-04-04 MED ORDER — MUPIROCIN 2 % EX OINT
1.0000 | TOPICAL_OINTMENT | Freq: Two times a day (BID) | CUTANEOUS | Status: AC
  Administered 2023-04-04 – 2023-04-08 (×9): 1 via NASAL
  Filled 2023-04-04 (×4): qty 22

## 2023-04-04 MED ORDER — ALBUMIN HUMAN 25 % IV SOLN
25.0000 g | Freq: Two times a day (BID) | INTRAVENOUS | Status: DC
  Administered 2023-04-04 – 2023-04-05 (×3): 25 g via INTRAVENOUS
  Filled 2023-04-04 (×3): qty 100

## 2023-04-04 MED ORDER — HYDROCORTISONE SOD SUC (PF) 100 MG IJ SOLR: 50.0000 mg | Freq: Every day | INTRAMUSCULAR | Status: DC

## 2023-04-04 MED ORDER — HYDROCORTISONE SOD SUC (PF) 100 MG IJ SOLR
50.0000 mg | Freq: Every day | INTRAMUSCULAR | Status: AC
  Administered 2023-04-06: 50 mg via INTRAVENOUS
  Filled 2023-04-04: qty 1

## 2023-04-04 MED ORDER — HYDROCORTISONE 5 MG PO TABS
5.0000 mg | ORAL_TABLET | Freq: Every day | ORAL | Status: DC
  Administered 2023-04-04 – 2023-04-08 (×5): 5 mg via ORAL
  Filled 2023-04-04 (×6): qty 1

## 2023-04-04 MED ORDER — HYDROCORTISONE SOD SUC (PF) 100 MG IJ SOLR
50.0000 mg | Freq: Two times a day (BID) | INTRAMUSCULAR | Status: AC
  Administered 2023-04-04 – 2023-04-05 (×2): 50 mg via INTRAVENOUS
  Filled 2023-04-04 (×2): qty 1

## 2023-04-04 NOTE — Care Management Important Message (Signed)
Important Message  Patient Details  Name: [facility] MRN: [id number] Date of Birth: [birth year]   Medicare Important Message Given:  Yes Tried without success to contact the patient to advise of the IM as a result will mail a copy to the patient home address.     Staysha Truby 04/04/2023, 4:08 PM

## 2023-04-04 NOTE — Consult Note (Signed)
[facility]    Date of Admission:  04/01/2023      Total days of antibiotics 4  Meropenem   Linezolid 6/14 >>                 Reason for Consult: Fournie's Gangrene    Referring Provider: Lowell Guitar Primary Care Provider: Annita Brod, MD    Assessment: Peter Yang is a 67 y.o. male with recent admission for gangrene of the right 5th ray s/p amputation 3/28 with 3 weeks of increased scrotal/penile pain here with necrotizing skin infection s/p debridement by urology   Scrotal Abscess, Necrotizing -  -Op Note Reviewed - The incision extends down to the pubic bone bilaterally which is approximately 7 cm deep.  -No cultures taken from OR, has a history of ESBL E coli UTI/bacteriuria. Agree with Merrem and Linezolid for broad treatment.  -Please send cultures at return to OR to help guide further therapy w/ h/o drug resistant organisms.  -Will d/w urology about condition of pelvic bone - may need MRI to further assess for possible contiguous osteomyelitis, this would affect treatment duration of his antibiotics.    Recent OM/Gangrene of Right 5th Ray s/p amputation 01-16-2023 - Non Healing Wound -  Arteriogram 01/16/23 w/ patent runoff   -5 cm x 4.5 cm unable to assess deepest part of wound bed due to the presence of devitalized tissue. Non compressible vessels described 07/2022. Arteriogram w/ patent runoff at the time of amputation.  -Cx GAS, blood negative at that time. Completed Ceftriaxone >> Ampicillin through 02/12/23 then an additional 2 weeks 1gm TID Amox.  -On exam from 6/12 photo he has non-healing wound present with some granulation tissue and adherent slough to the wound bed.   Stage 3 Sacral Wound -  -woc team has seen and provided recs.   T2DM -  -on SGLT2, will be stopped at D/C given fournier's    Plan: Continue merrem + linezolid - can convert the latter to oral with consistent PO intake as it is highly bioavailable  MRI w/  and w/o of pelvis to assess for any osteomyelitis of the pelvis given description of depth of infection reaching the pubic bones  FU next week with return to OR - if any further debridement needed would ask if urology could send for cultures please  Contact precautions with esbl hx   Dr. Renold Don will check in on the patient over the weekend.    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)   Long term current use of systemic steroids   Fournier's gangrene in male   Fournier disease   Necrotizing soft tissue infection    atorvastatin  80 mg Oral Daily   Chlorhexidine Gluconate Cloth  6 each Topical Daily   furosemide  40 mg Intravenous Once   furosemide  40 mg Intravenous Once   gabapentin  300 mg Oral QHS   [START ON  04/07/2023] hydrocortisone  10 mg Oral Daily   And   hydrocortisone  5 mg Oral QHS   hydrocortisone sod succinate (SOLU-CORTEF) inj  50 mg Intravenous Q12H   Followed by   Melene Muller ON 04/06/2023] hydrocortisone sod succinate (SOLU-CORTEF) inj  50 mg Intravenous Daily   insulin aspart  0-15 Units Subcutaneous TID WC   insulin aspart  0-5 Units Subcutaneous QHS   insulin glargine-yfgn  15 Units Subcutaneous QHS   metoprolol succinate  12.5 mg Oral Daily   mupirocin ointment  1 Application Nasal BID   nutrition supplement (JUVEN)  1 packet Oral BID BM   pantoprazole  40 mg Oral Daily   polyethylene glycol  17 g Oral BID   Ensure Max Protein  11 oz  Oral BID    HPI: Peter Yang is a 67 y.o. male admitted with deep scrotal abscess.   PMHX: L BKA, CHF, AFib, ESBL E coli bacteriuria, HTN, T2DM  Recent History:  Admitted at [facility]24 for DFU s/p 5th ray amputation. D/C to [facility]   Presented from [facility] 6/11 after purulent material and blood coming from catheter that was placed 6/8 due to acute retention. In ER he was found to have 12.7 WBC, turbid urine Urology came to evaluate and took for I&D 6/12 for necrotizing infection at the base of his penis - plans for return 6/19  He has had suprapubic/scrotal tenderness for about 3 weeks with escalation in pain/symptoms.   He is in a lot of pain at the time of our assessment and does not have much else to contribute.    Review of Systems: Review of Systems  Constitutional:  Negative for chills and fever.  Genitourinary:  Negative for dysuria.       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 Overview:  Added automatically from request for surgery [id number] Formatting of this note might be different from the original. Added automatically from request for surgery [id number]   Large bowel perforation (HCC) 09/27/2015    Malignant neoplasm of prostate (HCC) 10/22/2019   Myopia with astigmatism and presbyopia, bilateral 06/15/2018   Last Assessment & Plan:  Formatting of this note might be different from the original. - Wrx printed Formatting of this note might be different from the original. 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 - Stable, monitor Formatting of this note might  be different from the original. Last Assessment & Plan:  - Stable, monitor   Retroperitoneal abscess (HCC) 10/02/2015   Status post corneal transplant 10/07/2016   Formatting of this note might be different from the original. Overview:  Added automatically from request for surgery [id number] Formatting of this note might be different from the original. Added automatically from request for surgery [id number]  Last Assessment & Plan:  Formatting of this note might be different from the original. - OD, 2/2 KCN - Stable, no NVK / rejection - Continue off steroids - Mo   Thoracic ascending aortic aneurysm (HCC)    TIA (transient ischemic attack) 2012   Type 2 diabetes mellitus (HCC)     Social History   Tobacco Use   Smoking status: Never   Smokeless tobacco: Never  Substance Use Topics   Alcohol use: Never   Drug use: Never    Family History  Problem Relation Age of Onset   Diabetes Mother    Hypertension Mother    Heart disease Mother    Cancer Father    Heart attack Sister    Cancer Sister    Diabetes Brother    Allergies  Allergen Reactions   Sglt2 Inhibitors     Fournier's gangrene    OBJECTIVE: Blood pressure 106/74, pulse 89, temperature 97.7 F (36.5 C), temperature source Oral, resp. rate 20,  height 6\' 4"  (1.93 m), weight 102.1 kg, SpO2 97 %.   Physical Exam Constitutional:      Appearance: He is ill-appearing.  Cardiovascular:     Rate and Rhythm: Normal rate and regular rhythm.  Pulmonary:     Effort: Pulmonary effort is normal.     Breath sounds: Normal breath sounds.  Genitourinary:    Comments: Bilateral deep incisions that appear clean to scrota. Serosanguinous drainage on packing.  Skin:    General: Skin is warm and dry.  Neurological:     Mental Status: He is alert and oriented to person, place, and time.     Lab Results Lab Results  Component Value Date   WBC 15.5 (H) 04/04/2023   HGB 8.6 (L) 04/04/2023   HCT 29.0 (L) 04/04/2023   MCV 79.0 (L) 04/04/2023   PLT 268 04/04/2023    Lab Results  Component Value Date   CREATININE 1.25 (H) 04/04/2023   BUN 35 (H) 04/04/2023   NA 127 (L) 04/04/2023   K 4.0 04/04/2023   CL 98 04/04/2023   CO2 20 (L) 04/04/2023    Lab Results  Component Value Date   ALT 16 04/04/2023   AST 21 04/04/2023   ALKPHOS 70 04/04/2023   BILITOT 0.7 04/04/2023     Microbiology: Recent Results (from the past 240 hour(s))  Urine Culture     Status: None   Collection Time: 04/01/23  5:46 PM   Specimen: Urine, Catheterized  Result Value Ref Range Status   Specimen Description URINE, CATHETERIZED  Final   Special Requests NONE  Final   Culture   Final    NO GROWTH Performed at [facility], [address], [place], [place] [address]    Report Status 04/02/2023 FINAL  Final  MRSA Next Gen by PCR, Nasal     Status: Abnormal   Collection Time: 04/02/23  1:00 AM   Specimen: Nasal Mucosa; Nasal Swab  Result Value Ref Range Status   MRSA by PCR Next Gen DETECTED (A) NOT DETECTED Final    Comment: RESULT CALLED TO, READ BACK BY  AND VERIFIED WITH: RN KAITLYN BLUCHER ON 04/02/23 @ 1515 BY DRT (NOTE) The GeneXpert MRSA Assay (FDA approved for NASAL specimens only), is one component of a comprehensive MRSA colonization  surveillance program. It is not intended to diagnose MRSA infection nor to guide or monitor treatment for MRSA infections. Test performance is not FDA approved in patients less than 48 years old. Performed at [facility], [address], [place], [place] [address]      Rexene Alberts, MSN, NP-C [facility]  [email or web address] Pager: [phone] Office: [phone] [facility] Main Line: [phone] *Secure Chat Communication Welcome

## 2023-04-04 NOTE — Inpatient Diabetes Management (Signed)
Inpatient Diabetes Program Recommendations  AACE/ADA: New Consensus Statement on Inpatient Glycemic Control (2015)  Target Ranges:  Prepandial:   less than 140 mg/dL      Peak postprandial:   less than 180 mg/dL (1-2 hours)      Critically ill patients:  140 - 180 mg/dL   Lab Results  Component Value Date   GLUCAP 265 (H) 04/04/2023   HGBA1C 7.0 (H) 04/02/2023    Review of Glycemic Control  Latest Reference Range & Units 04/03/23 12:08 04/03/23 15:37 04/03/23 20:54 04/04/23 06:15 04/04/23 12:01  Glucose-Capillary 70 - 99 mg/dL 161 (H) 096 (H) 045 (H) 255 (H) 265 (H)  (H): Data is abnormally high  Diabetes history: DM2 Outpatient Diabetes medications: Jardiance 5 mg QD, Metformin 1000 mg BID Current orders for Inpatient glycemic control: Semglee 15 units QHS, Novolog 0-15 units TID and 0-5 units QHS, Solu-cortef 5 mg tablet QHS & 50 mg IV Q12H  Inpatient Diabetes Program Recommendations:    Might consider:  Semgle 18 units QHS Novolog 0-20 units TID and HS while on steroids  Will continue to follow while inpatient.  Thank you, Dulce Sellar, MSN, CDCES Diabetes Coordinator Inpatient Diabetes Program [phone] (team pager from 8a-5p)

## 2023-04-04 NOTE — Progress Notes (Signed)
Pharmacy note - antibiotic stewardship  [name] has been on antibiotics for fournier's gangrene.  Creatinine is up a bit.  Asked Dr. Lowell Guitar about changing regimen to Linezolid and Meropenem, stopping vancomycin and clindamycin.  He agreed.  Orders received.  Antimicrobials this admission: Meropenem 6/11>> Vanc 6/12>>6/14 Clinda 6/12>>6/14  Linezolid 6/14>>   Celedonio Miyamoto, PharmD, BCIDP Clinical Pharmacist Phone [phone]

## 2023-04-04 NOTE — Progress Notes (Signed)
2 Days Post-Op Subjective: 6/13: NAEON. Pt resting on arrival and pain was well controlled. Tolerated wound care with some difficulty despite premedication.   Objective: Vital signs in last 24 hours: Temp:  [97.6 F (36.4 C)-98.6 F (37 C)] 97.7 F (36.5 C) (06/14 1104) Pulse Rate:  [61-94] 89 (06/14 1104) Resp:  [14-20] 20 (06/14 1104) BP: (77-106)/(53-74) 106/74 (06/14 1104) SpO2:  [93 %-98 %] 97 % (06/14 1104) Weight:  [102.1 kg] 102.1 kg (06/14 0500)  Intake/Output from previous day: 06/13 0701 - 06/14 0700 In: 654 [P.O.:654] Out: 2950 [Urine:2950]  Intake/Output this shift: Total I/O In: 445.6 [IV Piggyback:445.6] Out: 450 [Urine:450]  Physical Exam:  General: Alert and oriented CV: No cyanosis Lungs: equal chest rise Abdomen: Soft, NTND, no rebound or guarding Gu: Foley catheter in place draining cloudy yellow urine.   Lab Results: Recent Labs    04/03/23 0434 04/03/23 1830 04/04/23 0242  HGB 9.0* 9.0* 8.6*  HCT 29.7* 29.7* 29.0*   BMET Recent Labs    04/03/23 1810 04/04/23 0242  NA 129* 127*  K 3.9 4.0  CL 97* 98  CO2 22 20*  GLUCOSE 231* 258*  BUN 31* 35*  CREATININE 1.34* 1.25*  CALCIUM 7.8* 7.8*     Studies/Results: DG CHEST PORT 1 VIEW  Result Date: 04/03/2023 CLINICAL DATA:  Sepsis EXAM: PORTABLE CHEST 1 VIEW COMPARISON:  Previous studies including the examination of 01/24/2023 FINDINGS: Transverse diameter of heart is increased. Increased density is seen in both lower lung fields, more so on the right side. There are no signs of alveolar pulmonary edema. Right lateral CP angle is indistinct. There is no pneumothorax. IMPRESSION: Cardiomegaly. There is increased density in both lower lung fields, more so on the right side suggesting bilateral pleural effusions and possibly underlying atelectasis/pneumonia. Electronically Signed   By: Ernie Avena M.D.   On: 04/03/2023 19:10    Assessment/Plan: Nursing completed wound care prior to my  arrival. Plan to repeat on Sun. To the OR for closure on Wed.   NPO Tues night.  Leukocytosis up overnight, acceptable POD#2. Monitor daily labs.   Broad ABX per primary- Merrem/Clinda  Will see as needed for wound care/assessment. Please call with questions    LOS: 2 days   Elmon Kirschner, NP [facility] Pager: [phone]  04/04/2023, 1:18 PM

## 2023-04-04 NOTE — Consult Note (Signed)
Cardiology Consultation   Patient ID: Peter Yang MRN: [id number]; DOB: April 12, 1956  Admit date: 04/01/2023 Date of Consult: 04/04/2023  PCP:  Annita Brod, MD   [facility] Providers Cardiologist:  Chrystie Nose, MD     Patient Profile:   Mckinney Poplar is a 67 y.o. male with a hx of CAD status post PTCA and PCI of RCA '2023, left BKA/right ray amputation, HFrEF, paroxysmal atrial fibrillation, diabetes, hypertension, hyperlipidemia, prostate CA, recurrent UTIs, bowel perforation, OSA on CPAP who is being seen 04/04/2023 for the evaluation of CHF at the request of Dr. Lowell Guitar.  History of Present Illness:   Peter Yang is a 67 year old male with past medical history noted above.  Extremely complex past medical history.  From a cardiac standpoint he has a history of CAD status post PTCA and PCI of the RCA 01/2022 at outside facility.  He was seen by [facility] heart care 05/2022 for a non-STEMI with peak troponin of 6000 in the setting of being found down and not breathing with a hypoglycemic episode and other medical issues.  He was found to have atrial fibrillation at that time.  He underwent cardiac catheterization during that admission showing 50% stenosis of the mid LAD, 95% stenosis of the dominant branch of large D1, CTO of D1, CTO of proximal circumflex, CTO of proximal/mid RCA including prior stent with left-to-right collaterals.  He was deemed not a candidate for CABG and disease was not felt to be amendable to PCI.  EF was noted at 40 to 45% at that time.  His aspirin/Brilinta was changed to Plavix and Eliquis.  He was readmitted 1 month later secondary to altered mental status as well as other multiple medical issues and found to have beta troponin without chest pain which was felt to be demand ischemia.  Plavix was held in the setting of gross hematuria with clots requiring Foley placement and irrigation as well as septic shock secondary to a UTI requiring midodrine, Levophed,  dobutamine.  Hospital course was otherwise notable for a pressure ulcer of the sacrum, cellulitis of scrotum and hyperkalemia requiring Lokelma.  Echocardiogram during that admission showed progressive LV dysfunction with LVEF of 30 to 35%, grade 2 diastolic dysfunction, moderate RV dysfunction, mildly elevated PASP, mild RAE, moderate left atrial enlargement, moderate MR.  He was seen by palliative care that admission and transition to a DNR/DNI but wished to receive full scope of care otherwise.  Repeat hospitalization 08/2022 for acute on chronic CHF, therapy was limited in the setting of hypotension.  He was felt to not be a candidate during that admission for home inotropes or advanced heart failure therapies.  Medications at discharge for Eliquis 5 mg twice daily, atorvastatin 80 mg daily, Plavix 75 mg daily, midodrine 10 mg 3 times daily.  Recently admitted 01/2023 at [facility] for a right diabetic foot ulcer with ray amputation.  Presented to the ED on 6/11 with complaints of hematuria, pus and dysuria with indwelling Foley.  Admission labs showed sodium 133, potassium 4.1, creatinine 1.13, WBC 12.7, hemoglobin 9.8, albumin 1.8>>1.6.  CT abdomen pelvis noted soft tissue stranding/gas within the subcutaneous soft tissue surrounding the base of the penis extending into the anterior abdominal wall concerning for Fournier's gangrene.  He was admitted to internal medicine for further management.  Seen by urology and underwent I&D 6/12.  Chest x-ray on 6/13 suggestive of bilateral pleural effusions, BNP 2487.  With his chronic hypotension cardiology has now been asked to  evaluate in regards to his heart failure.  At the time of interview he only complains of surgical, post op pain. No chest pain, or dyspnea. Resting comfortably supine in bed.   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/22/2019   Coronary artery disease involving native coronary artery of native heart without angina pectoris 06/25/2022   Diabetic polyneuropathy associated with type 2 diabetes mellitus (HCC) 07/28/2021   Essential hypertension 01/25/2016   Hematuria and +fecal occult  06/25/2022   History of diabetic ulcer of foot 12/31/2019   History of non-ST elevation myocardial infarction (NSTEMI) 06/15/2022   Hx of right coronary artery stent placement 02/11/2022   Hyperlipidemia    Ischemic cardiomyopathy    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 Overview:  Added automatically from request for surgery [id number] Formatting of this note might be different from the original. Added automatically from request for surgery [id number]   Large bowel perforation (HCC) 09/27/2015   Malignant neoplasm of prostate (HCC) 10/22/2019   Myopia with astigmatism and presbyopia, bilateral 06/15/2018   Last Assessment & Plan:  Formatting of this note might be different from the original. - Wrx printed Formatting of this note might be different from the original. 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 - Stable, monitor Formatting of this note might be different from the original. Last Assessment & Plan:  - Stable, monitor   Retroperitoneal abscess (HCC) 10/02/2015   Status post corneal transplant 10/07/2016   Formatting of this note might be different from the original. Overview:  Added automatically from request for surgery [id number] Formatting of this note might be different from the original. Added automatically from  request for surgery [id number]  Last Assessment & Plan:  Formatting of this note might be different from the original. - OD, 2/2 KCN - Stable, no NVK / rejection - Continue off steroids - Mo   Thoracic ascending aortic aneurysm (HCC)    TIA (transient ischemic attack) 2012   Type 2 diabetes mellitus [facility])     Past Surgical History:  Procedure Laterality Date   BELOW KNEE LEG AMPUTATION Left 2022   CORONARY ANGIOPLASTY WITH STENT PLACEMENT  01/2022   DES RCA   IRRIGATION AND DEBRIDEMENT ABSCESS N/A 04/02/2023   Procedure: IRRIGATION AND DEBRIDEMENT ABSCESS;  Surgeon: Crist Fat, MD;  Location: [facility] OR;  Service: Urology;  Laterality: N/A;   LEFT HEART CATH AND CORONARY ANGIOGRAPHY N/A 06/17/2022   Procedure: LEFT HEART CATH AND CORONARY ANGIOGRAPHY;  Surgeon: Yvonne Kendall, MD;  Location: [facility] INVASIVE CV LAB;  Service: Cardiovascular;  Laterality: N/A;   ROTATOR CUFF REPAIR Left      Inpatient Medications: Scheduled Meds:  atorvastatin  80 mg Oral Daily   Chlorhexidine Gluconate Cloth  6 each Topical Daily   furosemide  40 mg Intravenous Once   gabapentin  300 mg Oral QHS   [START ON 04/07/2023] hydrocortisone  10 mg Oral Daily   And  hydrocortisone  5 mg Oral QHS   hydrocortisone sod succinate (SOLU-CORTEF) inj  50 mg Intravenous Q12H   Followed by   Melene Muller ON 04/06/2023] hydrocortisone sod succinate (SOLU-CORTEF) inj  50 mg Intravenous Daily   insulin aspart  0-15 Units Subcutaneous TID WC   insulin aspart  0-5 Units Subcutaneous QHS   insulin glargine-yfgn  15 Units Subcutaneous QHS   metoprolol succinate  12.5 mg Oral Daily   mupirocin ointment  1 Application Nasal BID   nutrition supplement (JUVEN)  1 packet Oral BID BM   pantoprazole  40 mg Oral Daily   polyethylene glycol  17 g Oral BID   Ensure Max Protein  11 oz Oral BID   Continuous Infusions:  albumin human 25 g (04/04/23 1125)   albumin human     linezolid (ZYVOX) IV 600 mg (04/04/23 1011)   meropenem  (MERREM) IV 1 g (04/04/23 0539)   PRN Meds: acetaminophen **OR** acetaminophen, HYDROmorphone (DILAUDID) injection, melatonin, ondansetron **OR** ondansetron (ZOFRAN) IV, oxyCODONE  Allergies:    Allergies  Allergen Reactions   Sglt2 Inhibitors     Fournier's gangrene    Social History:   Social History   Socioeconomic History   Marital status: Single    Spouse name: Not on file   Number of children: Not on file   Years of education: Not on file   Highest education level: Not on file  Occupational History   Not on file  Tobacco Use   Smoking status: Never   Smokeless tobacco: Never  Substance and Sexual Activity   Alcohol use: Never   Drug use: Never   Sexual activity: Not on file  Other Topics Concern   Not on file  Social History Narrative   Not on file   Social Determinants of Health   Financial Resource Strain: Not on file  Food Insecurity: No Food Insecurity (04/02/2023)   Hunger Vital Sign    Worried About Running Out of Food in the Last Year: Never true    Ran Out of Food in the Last Year: Never true  Transportation Needs: No Transportation Needs (04/02/2023)   PRAPARE - Administrator, Civil Service (Medical): No    Lack of Transportation (Non-Medical): No  Physical Activity: Not on file  Stress: Not on file  Social Connections: Not on file  Intimate Partner Violence: Not At Risk (04/02/2023)   Humiliation, Afraid, Rape, and Kick questionnaire    Fear of Current or Ex-Partner: No    Emotionally Abused: No    Physically Abused: No    Sexually Abused: No    Family History:    Family History  Problem Relation Age of Onset   Diabetes Mother    Hypertension Mother    Heart disease Mother    Cancer Father    Heart attack Sister    Cancer Sister    Diabetes Brother      ROS:  Please see the history of present illness.   All other ROS reviewed and negative.     Physical Exam/Data:   Vitals:   04/04/23 0354 04/04/23 0500 04/04/23  0735 04/04/23 1104  BP: 94/64  94/67 106/74  Pulse: 84  83 89  Resp: 16  16 20   Temp: 98.5 F (36.9 C)  97.8 F (36.6 C) 97.7 F (36.5 C)  TempSrc: Oral  Oral Oral  SpO2: 98%  98% 97%  Weight:  102.1 kg    Height:  Intake/Output Summary (Last 24 hours) at 04/04/2023 1342 Last data filed at 04/04/2023 1125 Gross per 24 hour  Intake 745.56 ml  Output 2200 ml  Net -1454.44 ml      04/04/2023    5:00 AM 04/02/2023    1:24 PM 04/01/2023    4:48 PM  Last 3 Weights  Weight (lbs) 225 lb 1.4 oz 225 lb 260 lb 5.8 oz  Weight (kg) 102.1 kg 102.059 kg 118.1 kg     Body mass index is 27.4 kg/m.  General:  Frail, chronically ill appearing male, laying comfortably in bed HEENT: normal Neck: no JVD Vascular: No carotid bruits; Distal pulses 2+ bilaterally Cardiac:  normal S1, S2; RRR; no murmur  Lungs:  clear to auscultation bilaterally, no wheezing, rhonchi or rales  Abd: soft, nontender, no hepatomegaly  Ext: no edema Musculoskeletal:  left BKA Skin: warm and dry  Neuro:  CNs 2-12 intact, no focal abnormalities noted Psych:  Normal affect   EKG:  The EKG was personally reviewed and demonstrates:  N/a Telemetry:  Telemetry was personally reviewed and demonstrates:  Sinus Rhythm  Relevant CV Studies:  Cath: 05/2022  Conclusions: Severe multivessel coronary artery disease with severe calcification, as detailed below, including 50% mid LAD stenosis, 95% stenosis of dominant branch of large D1 and chronic total occlusion of lateral branch of D1, chronic total occlusion of LCx, and chronic total occlusion of ostial through mid RCA (including stent placed in 01/2022) with left-to-right collaterals. Normal left ventricular filling pressure (LVEDP 10 mmHg).   Recommendations: Consider viability study with particular attention to the inferior wall.  If RCA territory is viable, cardiac surgery consultation for CABG should be considered.  If RCA territory is not viable (or the patient is  not a candidate for CABG), medical therapy versus PCI to dominant branch of D1 (particularly if the patient reports recurrent angina) should be considered. Hold ticagrelor pending further workup and possible cardiac surgery consultation.  If the patient does not undergo CABG, I would favor 12 months of clopidogrel + DOAC therapy in the setting of new atrial fibrillation and NSTEMI. Aggressive secondary prevention of coronary artery disease.   Yvonne Kendall, MD [facility]  Diagnostic Dominance: Right   Echo: 08/2022  IMPRESSIONS     1. Left ventricular ejection fraction, by estimation, is 30 to 35%. The  left ventricle has moderately decreased function. The left ventricle  demonstrates regional wall motion abnormalities (see scoring  diagram/findings for description). The left  ventricular internal cavity size was mildly dilated. Left ventricular  diastolic parameters are consistent with Grade II diastolic dysfunction  (pseudonormalization).   2. Right ventricular systolic function is moderately reduced. The right  ventricular size is mildly enlarged. There is mildly elevated pulmonary  artery systolic pressure. The estimated right ventricular systolic  pressure is 44.7 mmHg.   3. Left atrial size was moderately dilated.   4. Right atrial size was mildly dilated.   5. The mitral valve is abnormal. Moderate mitral valve regurgitation. No  evidence of mitral stenosis.   6. The aortic valve is tricuspid. There is mild calcification of the  aortic valve. Aortic valve regurgitation is not visualized. Aortic valve  sclerosis is present, with no evidence of aortic valve stenosis.   7. Aortic dilatation noted. There is mild dilatation of the aortic root,  measuring 42 mm.   8. The inferior vena cava is normal in size with <50% respiratory  variability, suggesting right atrial pressure of 8 mmHg.  Comparison(s): Changes from prior study are noted. EF significantly  reduced  compared to prior, now with more prominent anterior wall motion  abnormalities. Findings communicated to PCCM team.   Conclusion(s)/Recommendation(s): No left ventricular mural or apical  thrombus/thrombi.   FINDINGS   Left Ventricle: Left ventricular ejection fraction, by estimation, is 30  to 35%. The left ventricle has moderately decreased function. The left  ventricle demonstrates regional wall motion abnormalities. Definity  contrast agent was given IV to delineate  the left ventricular endocardial borders. The left ventricular internal  cavity size was mildly dilated. There is no left ventricular hypertrophy.  Left ventricular diastolic parameters are consistent with Grade II  diastolic dysfunction  (pseudonormalization).     LV Wall Scoring:  The mid and distal anterior wall and apical lateral segment are akinetic.  The  antero-lateral wall, posterior wall, basal anteroseptal segment, basal  anterior segment, basal inferior segment, basal inferoseptal segment, and  apex are hypokinetic. The mid and distal anterior septum, mid and distal  inferior wall, and mid inferoseptal segment are normal.   Right Ventricle: The right ventricular size is mildly enlarged. Right  vetricular wall thickness was not well visualized. Right ventricular  systolic function is moderately reduced. There is mildly elevated  pulmonary artery systolic pressure. The tricuspid   regurgitant velocity is 3.03 m/s, and with an assumed right atrial  pressure of 8 mmHg, the estimated right ventricular systolic pressure is  44.7 mmHg.   Left Atrium: Left atrial size was moderately dilated.   Right Atrium: Right atrial size was mildly dilated.   Pericardium: There is no evidence of pericardial effusion.   Mitral Valve: The mitral valve is abnormal. Moderate mitral valve  regurgitation. No evidence of mitral valve stenosis.   Tricuspid Valve: The tricuspid valve is normal in structure. Tricuspid  valve  regurgitation is mild . No evidence of tricuspid stenosis.   Aortic Valve: The aortic valve is tricuspid. There is mild calcification  of the aortic valve. Aortic valve regurgitation is not visualized. Aortic  valve sclerosis is present, with no evidence of aortic valve stenosis.  Aortic valve mean gradient measures  2.0 mmHg. Aortic valve peak gradient measures 3.5 mmHg. Aortic valve area,  by VTI measures 3.12 cm.   Pulmonic Valve: The pulmonic valve was not well visualized. Pulmonic valve  regurgitation is not visualized. No evidence of pulmonic stenosis.   Aorta: Aortic dilatation noted. There is mild dilatation of the aortic  root, measuring 42 mm.   Venous: The inferior vena cava is normal in size with less than 50%  respiratory variability, suggesting right atrial pressure of 8 mmHg.   IAS/Shunts: The atrial septum is grossly normal.   Additional Comments: There is a small pleural effusion in both left and  right lateral regions.       Laboratory Data:  High Sensitivity Troponin:  No results for input(s): "TROPONINIHS" in the last 720 hours.   Chemistry Recent Labs  Lab 04/03/23 0434 04/03/23 1810 04/04/23 0242  NA 130* 129* 127*  K 3.8 3.9 4.0  CL 100 97* 98  CO2 20* 22 20*  GLUCOSE 170* 231* 258*  BUN 32* 31* 35*  CREATININE 0.88 1.34* 1.25*  CALCIUM 7.6* 7.8* 7.8*  MG 1.5*  --  2.3  GFRNONAA >60 58* >60  ANIONGAP 10 10 9     Recent Labs  Lab 04/02/23 0318 04/03/23 0434 04/04/23 0242  PROT 5.5* 5.1* 5.1*  ALBUMIN 1.8* 1.7* 1.6*  AST 21 22 21  ALT 18 17 16   ALKPHOS 78 69 70  BILITOT 0.4 0.8 0.7   Lipids No results for input(s): "CHOL", "TRIG", "HDL", "LABVLDL", "LDLCALC", "CHOLHDL" in the last 168 hours.  Hematology Recent Labs  Lab 04/03/23 0434 04/03/23 1830 04/04/23 0242  WBC 14.4* 17.5* 15.5*  RBC 3.72* 3.68* 3.67*  HGB 9.0* 9.0* 8.6*  HCT 29.7* 29.7* 29.0*  MCV 79.8* 80.7 79.0*  MCH 24.2* 24.5* 23.4*  MCHC 30.3 30.3 29.7*  RDW 19.1*  19.1* 19.0*  PLT 276 297 268   Thyroid No results for input(s): "TSH", "FREET4" in the last 168 hours.  BNP Recent Labs  Lab 04/04/23 0237  BNP 2,487.5*    DDimer No results for input(s): "DDIMER" in the last 168 hours.   Radiology/Studies:  DG CHEST PORT 1 VIEW  Result Date: 04/03/2023 CLINICAL DATA:  Sepsis EXAM: PORTABLE CHEST 1 VIEW COMPARISON:  Previous studies including the examination of 01/24/2023 FINDINGS: Transverse diameter of heart is increased. Increased density is seen in both lower lung fields, more so on the right side. There are no signs of alveolar pulmonary edema. Right lateral CP angle is indistinct. There is no pneumothorax. IMPRESSION: Cardiomegaly. There is increased density in both lower lung fields, more so on the right side suggesting bilateral pleural effusions and possibly underlying atelectasis/pneumonia. Electronically Signed   By: Ernie Avena M.D.   On: 04/03/2023 19:10   CT Renal Stone Study  Result Date: 04/01/2023 CLINICAL DATA:  Bladder diverticulum EXAM: CT ABDOMEN AND PELVIS WITHOUT CONTRAST TECHNIQUE: Multidetector CT imaging of the abdomen and pelvis was performed following the standard protocol without IV contrast. RADIATION DOSE REDUCTION: This exam was performed according to the departmental dose-optimization program which includes automated exposure control, adjustment of the mA and/or kV according to patient size and/or use of iterative reconstruction technique. COMPARISON:  01/22/2023 FINDINGS: Lower chest: Moderate bilateral pleural effusions. Compressive atelectasis in the lower lobes. Diffuse calcifications in the visualized right coronary artery. Moderate aortic calcifications. Hepatobiliary: Layering gallstones within the gallbladder. No focal hepatic abnormality. Pancreas: No focal abnormality or ductal dilatation. Spleen: No focal abnormality.  Normal size. Adrenals/Urinary Tract: Adrenal glands normal. Mild left hydronephrosis, similar  prior study. Mild caliectasis on the right. Right ureters decompressed. Foley catheter is present within the bladder which is decompressed. There appears to be mild bladder wall thickening. Stomach/Bowel: Stomach, large and small bowel grossly unremarkable. Vascular/Lymphatic: Diffuse aortoiliac atherosclerosis. No evidence of aneurysm or adenopathy. Reproductive: There is gas noted within the subcutaneous soft tissues surrounding the base of the penis and extending in the lower anterior abdominal wall concerning for Fournier's gangrene. Diffuse edema noted throughout the scrotal wall. Other: Diffuse anasarca/edema throughout the abdominal wall and pelvis. Musculoskeletal: No acute bony abnormality. IMPRESSION: Gas and soft tissue stranding noted within the subcutaneous soft tissues surrounding the base of the penis and extending superiorly in the anterior abdominal wall concerning for Fournier's gangrene. Diffuse edema throughout the scrotal wall. Bladder is decompressed with Foley catheter present, but appears thick walled. This is associated with mild left hydronephrosis and right renal pelvicaliectasis. These findings are similar to prior study. Cholelithiasis. Coronary artery disease, aortic atherosclerosis. Moderate bilateral pleural effusions, bibasilar atelectasis. Electronically Signed   By: Charlett Nose M.D.   On: 04/01/2023 23:54     Assessment and Plan:   Peter Yang is a 67 y.o. male with a hx of CAD status post PTCA and PCI of RCA '2023, left BKA/right ray amputation, HFrEF, paroxysmal atrial fibrillation, diabetes, hypertension, hyperlipidemia,  prostate CA, recurrent UTIs, bowel perforation, OSA on CPAP who is being seen 04/04/2023 for the evaluation of CHF at the request of Dr. Lowell Guitar.  HFrEF ICM BiV failure -- known hx of cardiomyopathy, EF of 30-35%, moderately reduced RV, mod LAE, mild RAE, moderate MR with anterior WMA on echo 08/2022. Has known multivessel CAD, not candidate for CABG  or PCI. -- CXR 6/13 with bilateral pleural effusions, BNP 2487 (slightly lower than previous readings), actually appears comfortable in bed -- started on IV lasix this morning, had 2.9L UOP yesterday -- albumin 1.8>>1.6, also chronically hypotension requiring use on midodrine  -- GDMT: unable to add therapy given his hypotension, of note has been on SLGT2i prior to admission (now stopped with fournier's). Has been not a candidate from home inotropes or advance HF therapies previously -- blood pressures are soft, but stable -- no grossly volume overloaded on exam, diuresis as needed  Fournier's Gangrene UTI Indwelling foley Hx of ESBL E. Coli Gross hematuria -- s/p I&D, urology following -- antibiotics per primary, ID consulted today -- SGLT2 stopped -- urine has cleared, no hematuria noted today  Hypotension -- chronic issue, on midodrine PTA -- on stress dose steroids  Paroxsymal atrial fibrillation -- remains on SR -- on Eliquis 5mg  BID  Per Primary PVD S/p left BKA Right ray amp Urinary retention   Risk Assessment/Risk Scores:    New York Heart Association (NYHA) Functional Class NYHA Class III  CHA2DS2-VASc Score = 7  This indicates a 11.2% annual risk of stroke. [name] score is based upon: CHF History: 1 HTN History: 1 Diabetes History: 1 Stroke History: 2 Vascular Disease History: 1 Age Score: 1 Gender Score: 0   For questions or updates, please contact [facility] Please consult [email or web address] for contact info under    Signed, Laverda Page, NP  04/04/2023 1:42 PM

## 2023-04-04 NOTE — Plan of Care (Signed)
  Problem: Pain Managment: Goal: General experience of comfort will improve Outcome: Progressing   Problem: Safety: Goal: Ability to remain free from injury will improve Outcome: Progressing   Problem: Skin Integrity: Goal: Risk for impaired skin integrity will decrease Outcome: Progressing   

## 2023-04-04 NOTE — Progress Notes (Addendum)
PROGRESS NOTE    Peter Yang  ZOX:[id number] DOB: September 09, 1956 DOA: 04/01/2023 PCP: Annita Brod, MD  Chief Complaint  Patient presents with   Urinary Retention    Brief Narrative:   67 year old male with Peter Yang history of left below-knee amputation, chronic systolic heart failure, history of Peter Yang-fib, recent admission in April 2024 at [facility] for right diabetic foot ulcer status post fifth ray amputation, hypertension, type 2 diabetes who presents to the ER today from [place] nursing home due to pus and blood from his Foley catheter.  Patient had acute urinary tension on Saturday, June 8.  He had [name] Foley catheter placed at that time.  He started developing pain in his penis yesterday.  He was sent to the ER for evaluation.   Arrival temp 98.4 heart rate 100 blood pressure 95/66   White count 12.7, hemoglobin 9.8, platelets of 277   Sodium 131, potassium 4.1, bicarb 24, BUN of 32, creatinine 1.1, glucose 191   UA showed turbid colored urine.  Greater than 50 RBCs.   [facility] hospital[facility] contacted for admission.   Upon review of his chart, patient had been admitted to [facility] back in October 2023 where he was treated with ertapenem for Peter Yang ESBL E. coli UTI.  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)   Long term current use of systemic steroids   Fournier disease   Necrotizing  soft tissue infection  Acute on Chronic HFrEF (heart failure with reduced ejection fraction) (HCC) Volume overloaded on exam with dependent edema BP's soft and hypoalbuminemia, will give albumin with lasix CXR with increased density in both lower lung fields, bilateral effusions, possible atelectasis/pneumonia (clinically not c/w pneumonia) Strict I/O, daily weights Given hypotension and systolic HF, will ask for cardiologies assistance  Hypotension Relatively chronic it sounds On stress dose steroids, will start to wean these  Hold lisinopril, holding parameters for metop  Hyponatremia Due to hypervolemia, follow with diuresis  Fournier's Gangrene  UTI  CAUTI Continue abx, appreciate pharmacy assistance, will c/s ID S/p I&D of infection scrotal with some tissue debridement Pain management, bowel regimen  Hx ESBL E. 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   Gross hematuria Due to foley? This also seems to be known, history of this Peter Yang currently on hold, will resume post op when ok with urology Urine culture with no growth     Type 2 diabetes mellitus with diabetic peripheral angiopathy without gangrene, without long-term current use of insulin (HCC) Continue with SSI and lantus.   History of urinary retention Foley catheter placed on Saturday  03-29-2023. Start flomax   DNR (do not resuscitate) Verified with pt that he is Peter Yang DNR/DNI.   PAF (paroxysmal atrial fibrillation) (HCC) Stable.   Essential hypertension Stable. Has hx of orthostatic hypotension.   Long term current use of systemic steroids Pt remains on cortef bid. Unclear why he is still on cortef.   Appears to have started in October/November 2023. Stress dose steroids, weaning  Prostate Cancer Follow outpatient with urology     DVT prophylaxis: SCD Code Status: DNR Family Communication: none Disposition:   Status is: Inpatient Remains inpatient appropriate because: need for continued care   Consultants:  Urology PCCM  Procedures:  Incision and drainage of infection scrotal with some tissue debridement   Antimicrobials:  Anti-infectives (From admission, onward)    Start     Dose/Rate Route Frequency Ordered Stop   04/04/23 1000  linezolid (ZYVOX) IVPB 600 mg        600 mg 300 mL/hr over 60 Minutes Intravenous Every 12 hours 04/04/23 0902     04/04/23 0200  vancomycin (VANCOCIN) IVPB 1000 mg/200 mL premix  Status:  Discontinued        1,000 mg 200 mL/hr over 60 Minutes Intravenous Every 12 hours 04/03/23 1936 04/04/23 0902   04/03/23 1400  vancomycin (VANCOREADY) IVPB 1500 mg/300 mL  Status:  Discontinued        1,500 mg 150 mL/hr over 120 Minutes Intravenous Every 12 hours 04/03/23 1302 04/03/23 1936   04/02/23 1400  vancomycin (VANCOCIN) IVPB 1000 mg/200 mL premix  Status:  Discontinued        1,000 mg 200 mL/hr over 60 Minutes Intravenous Every 12 hours 04/02/23 0036 04/03/23 1302   04/02/23 0115  vancomycin (VANCOREADY) IVPB 2000 mg/400 mL        2,000 mg 200 mL/hr over 120 Minutes Intravenous  Once 04/02/23 0015 04/02/23 0529   04/02/23 0100  clindamycin (CLEOCIN) IVPB 900 mg  Status:  Discontinued        900 mg 100 mL/hr over 30 Minutes Intravenous Every 8 hours 04/02/23 0002 04/02/23 0005   04/02/23 0100  clindamycin (CLEOCIN) IVPB 900 mg  Status:  Discontinued        900 mg 100 mL/hr over 30 Minutes Intravenous Every 8 hours 04/02/23 0012 04/04/23 0902   04/02/23 0000  meropenem (MERREM) 1 g in sodium chloride 0.9 % 100 mL IVPB        1 g 200 mL/hr over 30 Minutes Intravenous Every 8 hours 04/01/23 2352     04/01/23 2215  meropenem (MERREM) 2 g in  sodium chloride 0.9 % 100 mL IVPB  Status:  Discontinued        2 g 280 mL/hr over 30 Minutes Intravenous  Once 04/01/23 2221 04/01/23 2352   04/01/23 1800  cefTRIAXone (ROCEPHIN) 1 g in sodium chloride 0.9 % 100 mL IVPB        1 g 200 mL/hr over 30 Minutes Intravenous  Once 04/01/23 1746 04/01/23 1902       Subjective: No complaints today  Objective: Vitals:   04/04/23 0100 04/04/23 0354 04/04/23 0500 04/04/23 0735  BP:  94/64  94/67  Pulse:  84  83  Resp: 14 16  16   Temp:  98.5 F (36.9 C)  97.8 F (36.6 C)  TempSrc:  Oral  Oral  SpO2:  98%  98%  Weight:   102.1 kg   Height:        Intake/Output Summary (Last 24 hours) at 04/04/2023  1037 Last data filed at 04/04/2023 0602 Gross per 24 hour  Intake 300 ml  Output 1750 ml  Net -1450 ml   Filed Weights   04/01/23 1648 04/02/23 1324 04/04/23 0500  Weight: 118.1 kg 102.1 kg 102.1 kg    Examination:  General: No acute distress. Cardiovascular: RRR Lungs: unlabored, diminished Neurological: Alert and oriented 3. Moves all extremities 4. Cranial nerves II through XII grossly intact. Skin: Warm and dry. No rashes or lesions. Extremities: dependent edema, 2+, dressing to RLE    Data Reviewed: I have personally reviewed following labs and imaging studies  CBC: Recent Labs  Lab 04/01/23 1817 04/02/23 0318 04/03/23 0434 04/03/23 1830 04/04/23 0242  WBC 12.7* 11.6* 14.4* 17.5* 15.5*  NEUTROABS 10.1* 8.8* 13.1*  --  14.2*  HGB 9.8* 9.5* 9.0* 9.0* 8.6*  HCT 33.0* 31.0* 29.7* 29.7* 29.0*  MCV 80.7 77.5* 79.8* 80.7 79.0*  PLT 277 271 276 297 268    Basic Metabolic Panel: Recent Labs  Lab 04/01/23 1817 04/02/23 0318 04/03/23 0434 04/03/23 1810 04/04/23 0242  NA 131* 131* 130* 129* 127*  K 4.1 3.7 3.8 3.9 4.0  CL 96* 98 100 97* 98  CO2 24 23 20* 22 20*  GLUCOSE 191* 174* 170* 231* 258*  BUN 32* 30* 32* 31* 35*  CREATININE 1.13 1.01 0.88 1.34* 1.25*  CALCIUM 8.4* 8.1* 7.6* 7.8* 7.8*  MG  --   --  1.5*   --  2.3  PHOS  --   --  3.7  --  3.6    GFR: Estimated Creatinine Clearance: 71.4 mL/min (Ching Rabideau) (by C-G formula based on SCr of 1.25 mg/dL (H)).  Liver Function Tests: Recent Labs  Lab 04/02/23 0318 04/03/23 0434 04/04/23 0242  AST 21 22 21   ALT 18 17 16   ALKPHOS 78 69 70  BILITOT 0.4 0.8 0.7  PROT 5.5* 5.1* 5.1*  ALBUMIN 1.8* 1.7* 1.6*    CBG: Recent Labs  Lab 04/03/23 0746 04/03/23 1208 04/03/23 1537 04/03/23 2054 04/04/23 0615  GLUCAP 233* 206* 225* 275* 255*     Recent Results (from the past 240 hour(s))  Urine Culture     Status: None   Collection Time: 04/01/23  5:46 PM   Specimen: Urine, Catheterized  Result Value Ref Range Status   Specimen Description URINE, CATHETERIZED  Final   Special Requests NONE  Final   Culture   Final    NO GROWTH Performed at [facility], [address], [place], [place] [address]    Report Status 04/02/2023 FINAL  Final  MRSA Next Gen by PCR, Nasal     Status: Abnormal   Collection Time: 04/02/23  1:00 AM   Specimen: Nasal Mucosa; Nasal Swab  Result Value Ref Range Status   MRSA by PCR Next Gen DETECTED (Gloriann Riede) NOT DETECTED Final    Comment: RESULT CALLED TO, READ BACK BY AND VERIFIED WITH: RN KAITLYN BLUCHER ON 04/02/23 @ 1515 BY DRT (NOTE) The GeneXpert MRSA Assay (FDA approved for NASAL specimens only), is one component of [facility] comprehensive MRSA colonization surveillance program. It is not intended to diagnose MRSA infection nor to guide or monitor treatment for MRSA infections. Test performance is not FDA approved in patients less than 42 years old. Performed at [facility], [address] [place], [place] [address]          Radiology Studies: DG CHEST PORT 1 VIEW  Result Date: 04/03/2023 CLINICAL DATA:  Sepsis EXAM: PORTABLE CHEST  1 VIEW COMPARISON:  Previous studies including the examination of 01/24/2023 FINDINGS: Transverse diameter of heart is increased. Increased density is seen in both lower  lung fields, more so on the right side. There are no signs of alveolar pulmonary edema. Right lateral CP angle is indistinct. There is no pneumothorax. IMPRESSION: Cardiomegaly. There is increased density in both lower lung fields, more so on the right side suggesting bilateral pleural effusions and possibly underlying atelectasis/pneumonia. Electronically Signed   By: Ernie Avena M.D.   On: 04/03/2023 19:10        Scheduled Meds:  atorvastatin  80 mg Oral Daily   Chlorhexidine Gluconate Cloth  6 each Topical Daily   furosemide  40 mg Intravenous Once   furosemide  40 mg Intravenous Once   gabapentin  300 mg Oral QHS   hydrocortisone sod succinate (SOLU-CORTEF) inj  100 mg Intravenous BID   insulin aspart  0-15 Units Subcutaneous TID WC   insulin aspart  0-5 Units Subcutaneous QHS   insulin glargine-yfgn  15 Units Subcutaneous QHS   metoprolol succinate  12.5 mg Oral Daily   mupirocin ointment  1 Application Nasal BID   nutrition supplement (JUVEN)  1 packet Oral BID BM   pantoprazole  40 mg Oral Daily   polyethylene glycol  17 g Oral BID   Ensure Max Protein  11 oz Oral BID   Continuous Infusions:  albumin human     albumin human     linezolid (ZYVOX) IV 600 mg (04/04/23 1011)   meropenem (MERREM) IV 1 g (04/04/23 0539)     LOS: 2 days    Time spent: over 30 min    Lacretia Nicks, MD [facility]   To contact the attending provider between 7A-7P or the covering provider during after hours 7P-7A, please log into the web site [email or web address] and access using universal [place] password for that web site. If you do not have the password, please call the hospital operator.  04/04/2023, 10:37 AM

## 2023-04-05 DIAGNOSIS — M00871 Arthritis due to other bacteria, right ankle and foot: Secondary | ICD-10-CM

## 2023-04-05 LAB — CBC WITH DIFFERENTIAL/PLATELET
Abs Immature Granulocytes: 0.1 10*3/uL — ABNORMAL HIGH (ref 0.00–0.07)
Basophils Absolute: 0 10*3/uL (ref 0.0–0.1)
Basophils Relative: 0 %
Eosinophils Absolute: 0 10*3/uL (ref 0.0–0.5)
Eosinophils Relative: 0 %
HCT: 28.7 % — ABNORMAL LOW (ref 39.0–52.0)
Hemoglobin: 8.8 g/dL — ABNORMAL LOW (ref 13.0–17.0)
Immature Granulocytes: 1 %
Lymphocytes Relative: 5 %
Lymphs Abs: 0.7 10*3/uL (ref 0.7–4.0)
MCH: 24 pg — ABNORMAL LOW (ref 26.0–34.0)
MCHC: 30.7 g/dL (ref 30.0–36.0)
MCV: 78.2 fL — ABNORMAL LOW (ref 80.0–100.0)
Monocytes Absolute: 0.8 10*3/uL (ref 0.1–1.0)
Monocytes Relative: 6 %
Neutro Abs: 11.5 10*3/uL — ABNORMAL HIGH (ref 1.7–7.7)
Neutrophils Relative %: 88 %
Platelets: 300 10*3/uL (ref 150–400)
RBC: 3.67 MIL/uL — ABNORMAL LOW (ref 4.22–5.81)
RDW: 18.9 % — ABNORMAL HIGH (ref 11.5–15.5)
WBC: 13.1 10*3/uL — ABNORMAL HIGH (ref 4.0–10.5)
nRBC: 0 % (ref 0.0–0.2)

## 2023-04-05 LAB — COMPREHENSIVE METABOLIC PANEL
ALT: 16 U/L (ref 0–44)
AST: 17 U/L (ref 15–41)
Albumin: 2.2 g/dL — ABNORMAL LOW (ref 3.5–5.0)
Alkaline Phosphatase: 65 U/L (ref 38–126)
Anion gap: 11 (ref 5–15)
BUN: 50 mg/dL — ABNORMAL HIGH (ref 8–23)
CO2: 19 mmol/L — ABNORMAL LOW (ref 22–32)
Calcium: 8.2 mg/dL — ABNORMAL LOW (ref 8.9–10.3)
Chloride: 97 mmol/L — ABNORMAL LOW (ref 98–111)
Creatinine, Ser: 1.03 mg/dL (ref 0.61–1.24)
GFR, Estimated: >60 mL/min (ref >60)
Glucose, Bld: 292 mg/dL — ABNORMAL HIGH (ref 70–99)
Potassium: 3.8 mmol/L (ref 3.5–5.1)
Sodium: 127 mmol/L — ABNORMAL LOW (ref 135–145)
Total Bilirubin: 0.7 mg/dL (ref 0.3–1.2)
Total Protein: 5.3 g/dL — ABNORMAL LOW (ref 6.5–8.1)

## 2023-04-05 LAB — MAGNESIUM: Magnesium: 2 mg/dL (ref 1.7–2.4)

## 2023-04-05 LAB — GLUCOSE, CAPILLARY
Glucose-Capillary: 260 mg/dL — ABNORMAL HIGH (ref 70–99)
Glucose-Capillary: 283 mg/dL — ABNORMAL HIGH (ref 70–99)
Glucose-Capillary: 231 mg/dL — ABNORMAL HIGH (ref 70–99)
Glucose-Capillary: 173 mg/dL — ABNORMAL HIGH (ref 70–99)

## 2023-04-05 LAB — PHOSPHORUS: Phosphorus: 3.2 mg/dL (ref 2.5–4.6)

## 2023-04-05 MED ORDER — LORAZEPAM 2 MG/ML IJ SOLN: 1.0000 mg | Freq: Once | INTRAMUSCULAR | Status: DC | PRN

## 2023-04-05 NOTE — Progress Notes (Signed)
3 Days Post-Op Subjective: NAEON. Pt resting on arrival and pain was well controlled.   Objective: Vital signs in last 24 hours: Temp:  [97 F (36.1 C)-97.8 F (36.6 C)] 97.4 F (36.3 C) (06/15 1215) Pulse Rate:  [78-96] 78 (06/15 1004) Resp:  [14-20] 16 (06/15 1215) BP: (86-99)/(63-76) 93/63 (06/15 1215) SpO2:  [94 %-99 %] 95 % (06/15 1215) Weight:  [102.3 kg] 102.3 kg (06/15 0313)  Intake/Output from previous day: 06/14 0701 - 06/15 0700 In: 2125.6 [P.O.:1030; IV Piggyback:1095.6] Out: 2600 [Urine:2600]  Intake/Output this shift: No intake/output data recorded.  Physical Exam:  General: Alert and oriented CV: No cyanosis Lungs: equal chest rise Abdomen: Soft, NTND, no rebound or guarding Gu: Foley catheter in place draining yellow urine; wound packing in place, not manipulated today  Lab Results: Recent Labs    04/03/23 1830 04/04/23 0242 04/05/23 0106  HGB 9.0* 8.6* 8.8*  HCT 29.7* 29.0* 28.7*    BMET Recent Labs    04/04/23 0242 04/05/23 0106  NA 127* 127*  K 4.0 3.8  CL 98 97*  CO2 20* 19*  GLUCOSE 258* 292*  BUN 35* 50*  CREATININE 1.25* 1.03  CALCIUM 7.8* 8.2*      Studies/Results: MR PELVIS W WO CONTRAST  Result Date: 04/05/2023 CLINICAL DATA:  Necrotizing soft tissue infection. Concern for osteomyelitis EXAM: MRI PELVIS WITHOUT AND WITH CONTRAST TECHNIQUE: Multiplanar multisequence MR imaging of the pelvis was performed both before and after administration of intravenous contrast. CONTRAST:  10mL GADAVIST GADOBUTROL 1 MMOL/ML IV SOLN COMPARISON:  CT 04/01/2023 FINDINGS: Bones/Joint/Cartilage Bony pelvis intact without fracture or diastasis. Bilateral hip joints are intact without fracture or dislocation. No femoral head avascular necrosis. Small bilateral hip joint effusions, nonspecific. No sacroiliac joint effusion. Moderate degenerative changes of the pubic symphysis with trace effusion. No bone marrow edema. No erosion or marrow replacement.  No bone lesion. Ligaments Intact. Muscles and Tendons Diffuse intramuscular edema throughout the pelvic and visualized thigh musculature, most severely affecting the adductor compartment muscles bilaterally. No organized intramuscular fluid collection. No acute tendinous abnormality. Soft tissues Severe body wall edema. Rim enhancing fluid and air collection within the soft tissues of the anterior pelvis at the base of the penis measuring 7.0 x 2.0 x 5.0 cm (volume = 37 cm^3). Collection abuts the anterior surface of the pubic symphysis. Small volume ascites within the pelvis. Thick walled urinary bladder, partially decompressed by Foley catheter. No organized intrapelvic fluid collection. IMPRESSION: 1. Rim-enhancing fluid and air collection within the soft tissues of the anterior pelvis at the base of the penis measuring 7.0 x 2.0 x 5.0 cm. Collection abuts the anterior surface of the pubic symphysis. Findings compatible with abscess. 2. Moderate degenerative changes of the pubic symphysis with trace effusion, likely degenerative/reactive. Septic arthritis of the pubic symphysis is not excluded given proximity of the adjacent soft tissue infection. 3. No evidence of osteomyelitis. 4. Diffuse myositis throughout the pelvic and visualized thigh musculature, most severely affecting the adductor compartment muscles bilaterally. No organized intramuscular fluid collection. 5. Small bilateral hip joint effusions, nonspecific. 6. Severe body wall edema. 7. Thick-walled urinary bladder, partially decompressed by Foley catheter. Electronically Signed   By: Duanne Guess D.O.   On: 04/05/2023 11:16   DG CHEST PORT 1 VIEW  Result Date: 04/03/2023 CLINICAL DATA:  Sepsis EXAM: PORTABLE CHEST 1 VIEW COMPARISON:  Previous studies including the examination of 01/24/2023 FINDINGS: Transverse diameter of heart is increased. Increased density is seen in both lower lung  fields, more so on the right side. There are no signs of  alveolar pulmonary edema. Right lateral CP angle is indistinct. There is no pneumothorax. IMPRESSION: Cardiomegaly. There is increased density in both lower lung fields, more so on the right side suggesting bilateral pleural effusions and possibly underlying atelectasis/pneumonia. Electronically Signed   By: Ernie Avena M.D.   On: 04/03/2023 19:10    Assessment/Plan: Plan to repeat dressing change on Sun. To the OR for closure on Wed.   NPO Tues night.  Broad ABX per primary- Merrem/Clinda  Will see as needed for wound care/assessment. Please call with questions    LOS: 3 days   04/05/2023, 12:25 PM

## 2023-04-05 NOTE — Progress Notes (Signed)
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 No cx available. Urology following and plan reexploration wound in or early this coming week  He appears to be doing ok outside of controlled pain in the perineum. No sign of active cellulitis/soft tissue infection right foot/LE but there remains nonhealing wound  Mri pelvis does suggest potential pubic symphysis septic arthritis. Might need 4-6 weeks abx treatment    PLAN: Continue wound management per urology Continue empiric linezolid and meropenem - meropenem decided at initial presentation and reasonable to keep for now Please send culture if further wound debridement to be done Suspect 4 weeks abx therapy after last OR procedure will be needed Discussed with primary team     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)   Long term current use of systemic steroids   Fournier's gangrene in male   Fournier disease   Necrotizing soft tissue infection   Allergies  Allergen Reactions   Sglt2 Inhibitors     Fournier's gangrene    Scheduled Meds:  atorvastatin  80 mg Oral Daily   Chlorhexidine Gluconate Cloth  6 each Topical Daily   gabapentin   300 mg Oral QHS   [START ON 04/07/2023] hydrocortisone  10 mg Oral Daily   And   hydrocortisone  5 mg Oral QHS   [START ON 04/06/2023] hydrocortisone sod succinate (SOLU-CORTEF) inj  50 mg Intravenous Daily   insulin aspart  0-15 Units Subcutaneous TID WC   insulin aspart  0-5 Units Subcutaneous QHS   insulin glargine-yfgn  15 Units Subcutaneous QHS   metoprolol succinate  12.5 mg Oral Daily   mupirocin ointment  1 Application Nasal BID   nutrition supplement (JUVEN)  1 packet Oral BID BM   pantoprazole  40 mg Oral Daily   polyethylene glycol  17 g Oral BID   Ensure Max Protein  11 oz Oral BID   Continuous Infusions:  albumin human 25 g (04/05/23 0755)   linezolid (ZYVOX) IV 600 mg (04/05/23 1012)   meropenem (MERREM) IV Stopped (04/05/23 0615)   PRN Meds:.acetaminophen **OR** acetaminophen, HYDROmorphone (DILAUDID) injection, melatonin, ondansetron **OR** ondansetron (ZOFRAN) IV, oxyCODONE   SUBJECTIVE: Pain in perineum Wound dressing changed this morning No other complaint  Planned or again next week by urology Afbrile No culture  Review of Systems: ROS All other ROS was negative, except mentioned above     OBJECTIVE: Vitals:   04/05/23 0300 04/05/23 0313 04/05/23 1004 04/05/23 1215  BP:  99/76 (!) 89/63 93/63  Pulse:  79 78   Resp: 14 20  16   Temp:  (!) 97 F (36.1 C)  (!) 97.4 F (36.3 C)  TempSrc:  Axillary  Oral  SpO2:    95%  Weight:  102.3 kg    Height:       Body mass index is 27.45 kg/m.  Physical Exam General/constitutional: no distress, pleasant, lying in bed HEENT: Normocephalic, PER, Conj Clear, EOMI, Oropharynx clear Neck supple CV: rrr no mrg Lungs: clear to auscultation, normal respiratory effort Abd: Soft, Nontender Ext: no edema Skin: No Rash Neuro: nonfocal MSK: left bka; right foot dressing clean/dry Gu: foley in place; dressing clean/dry  Lab Results Lab Results  Component Value Date   WBC 13.1 (H) 04/05/2023   HGB 8.8 (L)  04/05/2023   HCT 28.7 (L) 04/05/2023   MCV 78.2 (L) 04/05/2023   PLT 300 04/05/2023    Lab Results  Component Value Date   CREATININE 1.03 04/05/2023   BUN 50 (H) 04/05/2023   NA 127 (L) 04/05/2023   K 3.8 04/05/2023   CL 97 (L) 04/05/2023   CO2 19 (L) 04/05/2023    Lab Results  Component Value Date   ALT 16 04/05/2023   AST 17 04/05/2023   ALKPHOS 65 04/05/2023   BILITOT 0.7 04/05/2023      Microbiology: Recent Results (from the past 240 hour(s))  Urine Culture     Status: None   Collection Time: 04/01/23  5:46 PM   Specimen: Urine, Catheterized  Result Value Ref Range Status   Specimen Description URINE, CATHETERIZED  Final   Special Requests NONE  Final   Culture   Final    NO GROWTH Performed at [facility], [address], [place], [place] [address]    Report Status 04/02/2023 FINAL  Final  MRSA Next Gen by PCR, Nasal     Status: Abnormal   Collection Time: 04/02/23  1:00 AM   Specimen: Nasal Mucosa; Nasal Swab  Result Value Ref Range Status   MRSA by PCR Next Gen DETECTED (A) NOT DETECTED Final    Comment: RESULT CALLED TO, READ BACK BY AND VERIFIED WITH: RN KAITLYN BLUCHER ON 04/02/23 @ 1515 BY DRT (NOTE) The GeneXpert MRSA Assay (FDA approved for NASAL specimens only), is one component of a comprehensive MRSA colonization surveillance program. It is not intended to diagnose MRSA infection nor to guide or monitor treatment for MRSA infections. Test performance is not FDA approved in patients less than 64 years old. Performed at [facility], [address] [place], [place] [address]      Serology:   Imaging: If present, new imagings (plain films, ct scans, and mri) have been personally visualized and interpreted; radiology reports have been reviewed. Decision making incorporated into the Impression / Recommendations.  6/14 mri pelvis 1. Rim-enhancing fluid and air collection within the soft tissues of the anterior pelvis at the base of  the penis measuring 7.0 x 2.0 x 5.0 cm. Collection abuts the anterior surface of the pubic symphysis. Findings compatible with abscess. 2. Moderate degenerative changes of the pubic symphysis with trace effusion, likely degenerative/reactive. Septic arthritis of the pubic symphysis is not excluded given proximity of the adjacent soft tissue infection. 3. No evidence of osteomyelitis. 4. Diffuse myositis throughout the pelvic and visualized thigh musculature, most severely affecting the adductor compartment muscles bilaterally. No organized intramuscular fluid collection. 5. Small bilateral hip joint effusions, nonspecific. 6. Severe body wall edema. 7. Thick-walled urinary bladder, partially decompressed by Foley catheter.   Raymondo Band, MD [facility] [phone] pager    04/05/2023, 1:28 PM

## 2023-04-05 NOTE — Progress Notes (Addendum)
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  Patient had acute urinary tension on Saturday, June 8.  He had Peter Yang Foley catheter placed at that time.  He started developing pain in his penis yesterday.  He was sent to the ER for evaluation.   Arrival temp 98.4 heart rate 100 blood pressure 95/66   White count 12.7, hemoglobin 9.8, platelets of 277   Sodium 131, potassium 4.1, bicarb 24, BUN of 32, creatinine 1.1, glucose 191   UA showed turbid colored urine.  Greater than 50 RBCs.   [facility] hospital[facility] contacted for admission.   Upon review of his chart, patient had been admitted to [facility] back in October 2023 where he was treated with ertapenem for Mikaela Hilgeman ESBL E. coli UTI.  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)   Long term current use of systemic steroids   Fournier disease   Necrotizing  soft tissue infection  Acute on Chronic HFrEF (heart failure with reduced ejection fraction) (HCC) Volume overloaded on exam with dependent edema BP's soft and hypoalbuminemia, will give albumin with lasix CXR with increased density in both lower lung fields, bilateral effusions, possible atelectasis/pneumonia (clinically not c/w pneumonia) Strict I/O, daily weights Cardiology notes hold off on diuresis, use lasix prn for resp symptoms or hypoxia  Hypotension Relatively chronic it sounds On stress dose steroids, will start to wean these  Hold lisinopril, holding parameters for metop  Hyponatremia Will start fluid restriction and follow   Fournier's Gangrene  UTI  CAUTI Continue abx, appreciate pharmacy assistance, will c/s ID S/p I&D of infection scrotal with some tissue debridement Pain management, bowel regimen  Hx ESBL E. Coli 07/2022 MRI with rim enhancing fluid and air collection within the soft tissues of the anterior pelvis at the base of the penis (compatible with abscess), moderate degereenative changes of the pubic symphysis with trace effusion, septic arthritis of pubic symphysis not excluded.  No evidence of osteo.  Diffuse myositis throughout pelvic and thigh musculature.  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   Gross hematuria Due to foley? This also seems  to be known, history of this Peter Yang currently on hold, will resume post op when ok with urology Urine culture with no growth     Type 2 diabetes mellitus with diabetic peripheral angiopathy without gangrene, without long-term current use of insulin (HCC) Continue with SSI and lantus.    History of urinary retention Foley catheter placed on Saturday 03-29-2023. Start flomax   DNR (do not resuscitate) Verified with pt that he is Peter Yang DNR/DNI.   PAF (paroxysmal atrial fibrillation) (HCC) Stable.   Essential hypertension Stable. Has hx of orthostatic hypotension.   Long term current use of systemic steroids Pt remains on cortef bid. Unclear why he is still on cortef.  Appears to have started in October/November 2023. Stress dose steroids, weaning  Prostate Cancer Follow outpatient with urology     DVT prophylaxis: SCD Code Status: DNR Family Communication: none Disposition:   Status is: Inpatient Remains inpatient appropriate because: need for continued care   Consultants:  Urology PCCM  Procedures:  Incision and drainage of infection scrotal with some tissue debridement   Antimicrobials:  Anti-infectives (From admission, onward)    Start     Dose/Rate Route Frequency Ordered Stop   04/04/23 1000  linezolid (ZYVOX) IVPB 600 mg        600 mg 300 mL/hr over 60 Minutes Intravenous Every 12 hours 04/04/23 0902     04/04/23 0200  vancomycin (VANCOCIN) IVPB 1000 mg/200 mL premix  Status:  Discontinued        1,000 mg 200 mL/hr over 60 Minutes Intravenous Every 12 hours 04/03/23 1936 04/04/23 0902   04/03/23 1400  vancomycin (VANCOREADY) IVPB 1500 mg/300 mL  Status:  Discontinued        1,500 mg 150 mL/hr over 120 Minutes Intravenous Every 12 hours 04/03/23 1302 04/03/23 1936   04/02/23 1400  vancomycin (VANCOCIN) IVPB 1000 mg/200 mL premix  Status:  Discontinued        1,000 mg 200 mL/hr over 60 Minutes Intravenous Every 12 hours 04/02/23 0036 04/03/23 1302   04/02/23 0115  vancomycin (VANCOREADY) IVPB 2000 mg/400 mL        2,000 mg 200 mL/hr over 120 Minutes Intravenous  Once 04/02/23 0015 04/02/23 0529   04/02/23 0100  clindamycin (CLEOCIN) IVPB 900 mg  Status:  Discontinued        900 mg 100 mL/hr over 30 Minutes Intravenous Every 8 hours 04/02/23  0002 04/02/23 0005   04/02/23 0100  clindamycin (CLEOCIN) IVPB 900 mg  Status:  Discontinued        900 mg 100 mL/hr over 30 Minutes Intravenous Every 8 hours 04/02/23 0012 04/04/23 0902   04/02/23 0000  meropenem (MERREM) 1 g in sodium chloride 0.9 % 100 mL IVPB        1 g 200 mL/hr over 30 Minutes Intravenous Every 8 hours 04/01/23 2352     04/01/23 2215  meropenem (MERREM) 2 g in sodium chloride 0.9 % 100 mL IVPB  Status:  Discontinued        2 g 280 mL/hr over 30 Minutes Intravenous  Once 04/01/23 2221 04/01/23 2352   04/01/23 1800  cefTRIAXone (ROCEPHIN) 1 g in sodium chloride 0.9 % 100 mL IVPB        1 g 200 mL/hr over 30 Minutes Intravenous  Once 04/01/23 1746 04/01/23 1902       Subjective: C/o pain  Objective: Vitals:   04/05/23 0300 04/05/23 0313 04/05/23 1004 04/05/23 1215  BP:  99/76 (!) 40/98  93/63  Pulse:  79 78   Resp: 14 20  16   Temp:  (!) 97 F (36.1 C)  (!) 97.4 F (36.3 C)  TempSrc:  Axillary  Oral  SpO2:    95%  Weight:  102.3 kg    Height:        Intake/Output Summary (Last 24 hours) at 04/05/2023 1530 Last data filed at 04/05/2023 1610 Gross per 24 hour  Intake 700 ml  Output 2150 ml  Net -1450 ml   Filed Weights   04/02/23 1324 04/04/23 0500 04/05/23 0313  Weight: 102.1 kg 102.1 kg 102.3 kg    Examination:  General: No acute distress. Cardiovascular: RRR Lungs: unlabored Neurological: Alert and oriented 3. Moves all extremities 4 with equal strength. Cranial nerves II through XII grossly intact. Skin: GU wound less red, dressing intact Extremities: dependent edema    Data Reviewed: I have personally reviewed following labs and imaging studies  CBC: Recent Labs  Lab 04/01/23 1817 04/02/23 0318 04/03/23 0434 04/03/23 1830 04/04/23 0242 04/05/23 0106  WBC 12.7* 11.6* 14.4* 17.5* 15.5* 13.1*  NEUTROABS 10.1* 8.8* 13.1*  --  14.2* 11.5*  HGB 9.8* 9.5* 9.0* 9.0* 8.6* 8.8*  HCT 33.0* 31.0* 29.7* 29.7* 29.0* 28.7*  MCV 80.7 77.5*  79.8* 80.7 79.0* 78.2*  PLT 277 271 276 297 268 300    Basic Metabolic Panel: Recent Labs  Lab 04/02/23 0318 04/03/23 0434 04/03/23 1810 04/04/23 0242 04/05/23 0106  NA 131* 130* 129* 127* 127*  K 3.7 3.8 3.9 4.0 3.8  CL 98 100 97* 98 97*  CO2 23 20* 22 20* 19*  GLUCOSE 174* 170* 231* 258* 292*  BUN 30* 32* 31* 35* 50*  CREATININE 1.01 0.88 1.34* 1.25* 1.03  CALCIUM 8.1* 7.6* 7.8* 7.8* 8.2*  MG  --  1.5*  --  2.3 2.0  PHOS  --  3.7  --  3.6 3.2    GFR: Estimated Creatinine Clearance: 86.6 mL/min (by C-G formula based on SCr of 1.03 mg/dL).  Liver Function Tests: Recent Labs  Lab 04/02/23 0318 04/03/23 0434 04/04/23 0242 04/05/23 0106  AST 21 22 21 17   ALT 18 17 16 16   ALKPHOS 78 69 70 65  BILITOT 0.4 0.8 0.7 0.7  PROT 5.5* 5.1* 5.1* 5.3*  ALBUMIN 1.8* 1.7* 1.6* 2.2*    CBG: Recent Labs  Lab 04/04/23 1201 04/04/23 1558 04/04/23 2149 04/05/23 0607 04/05/23 1210  GLUCAP 265* 324* 241* 260* 283*     Recent Results (from the past 240 hour(s))  Urine Culture     Status: None   Collection Time: 04/01/23  5:46 PM   Specimen: Urine, Catheterized  Result Value Ref Range Status   Specimen Description URINE, CATHETERIZED  Final   Special Requests NONE  Final   Culture   Final    NO GROWTH Performed at [facility], [address] [place], [place] [address]    Report Status 04/02/2023 FINAL  Final  MRSA Next Gen by PCR, Nasal     Status: Abnormal   Collection Time: 04/02/23  1:00 AM   Specimen: Nasal Mucosa; Nasal Swab  Result Value Ref Range Status   MRSA by PCR Next Gen DETECTED ([place]) NOT DETECTED Final    Comment: RESULT CALLED TO, READ BACK BY AND VERIFIED WITH: RN KAITLYN BLUCHER ON 04/02/23 @ 1515 BY DRT (NOTE) The GeneXpert MRSA Assay (FDA approved for NASAL specimens only), is one component of [facility] comprehensive MRSA colonization surveillance program. It  is not intended to diagnose MRSA infection nor to guide or monitor treatment for MRSA  infections. Test performance is not FDA approved in patients less than 58 years old. Performed at [facility], [address] [place], [place] [address]          Radiology Studies: MR PELVIS W WO CONTRAST  Result Date: 04/05/2023 CLINICAL DATA:  Necrotizing soft tissue infection. Concern for osteomyelitis EXAM: MRI PELVIS WITHOUT AND WITH CONTRAST TECHNIQUE: Multiplanar multisequence MR imaging of the pelvis was performed both before and after administration of intravenous contrast. CONTRAST:  10mL GADAVIST GADOBUTROL 1 MMOL/ML IV SOLN COMPARISON:  CT 04/01/2023 FINDINGS: Bones/Joint/Cartilage Bony pelvis intact without fracture or diastasis. Bilateral hip joints are intact without fracture or dislocation. No femoral head avascular necrosis. Small bilateral hip joint effusions, nonspecific. No sacroiliac joint effusion. Moderate degenerative changes of the pubic symphysis with trace effusion. No bone marrow edema. No erosion or marrow replacement. No bone lesion. Ligaments Intact. Muscles and Tendons Diffuse intramuscular edema throughout the pelvic and visualized thigh musculature, most severely affecting the adductor compartment muscles bilaterally. No organized intramuscular fluid collection. No acute tendinous abnormality. Soft tissues Severe body wall edema. Rim enhancing fluid and air collection within the soft tissues of the anterior pelvis at the base of the penis measuring 7.0 x 2.0 x 5.0 cm (volume = 37 cm^3). Collection abuts the anterior surface of the pubic symphysis. Small volume ascites within the pelvis. Thick walled urinary bladder, partially decompressed by Foley catheter. No organized intrapelvic fluid collection. IMPRESSION: 1. Rim-enhancing fluid and air collection within the soft tissues of the anterior pelvis at the base of the penis measuring 7.0 x 2.0 x 5.0 cm. Collection abuts the anterior surface of the pubic symphysis. Findings compatible with abscess. 2. Moderate  degenerative changes of the pubic symphysis with trace effusion, likely degenerative/reactive. Septic arthritis of the pubic symphysis is not excluded given proximity of the adjacent soft tissue infection. 3. No evidence of osteomyelitis. 4. Diffuse myositis throughout the pelvic and visualized thigh musculature, most severely affecting the adductor compartment muscles bilaterally. No organized intramuscular fluid collection. 5. Small bilateral hip joint effusions, nonspecific. 6. Severe body wall edema. 7. Thick-walled urinary bladder, partially decompressed by Foley catheter. Electronically Signed   By: Duanne Guess D.O.   On: 04/05/2023 11:16   DG CHEST PORT 1 VIEW  Result Date: 04/03/2023 CLINICAL DATA:  Sepsis EXAM: PORTABLE CHEST 1 VIEW COMPARISON:  Previous studies including the examination of 01/24/2023 FINDINGS: Transverse diameter of heart is increased. Increased density is seen in both lower lung fields, more so on the right side. There are no signs of alveolar pulmonary edema. Right lateral CP angle is indistinct. There is no pneumothorax. IMPRESSION: Cardiomegaly. There is increased density in both lower lung fields, more so on the right side suggesting bilateral pleural effusions and possibly underlying atelectasis/pneumonia. Electronically Signed   By: Ernie Avena M.D.   On: 04/03/2023 19:10        Scheduled Meds:  atorvastatin  80 mg Oral Daily   Chlorhexidine Gluconate Cloth  6 each Topical Daily   gabapentin  300 mg Oral QHS   [START ON 04/07/2023] hydrocortisone  10 mg Oral Daily   And   hydrocortisone  5 mg Oral QHS   [START ON 04/06/2023] hydrocortisone sod succinate (SOLU-CORTEF) inj  50 mg Intravenous Daily   insulin aspart  0-15 Units Subcutaneous TID WC   insulin aspart  0-5 Units Subcutaneous QHS   insulin  glargine-yfgn  15 Units Subcutaneous QHS   metoprolol succinate  12.5 mg Oral Daily   mupirocin ointment  1 Application Nasal BID   nutrition supplement  (JUVEN)  1 packet Oral BID BM   pantoprazole  40 mg Oral Daily   polyethylene glycol  17 g Oral BID   Ensure Max Protein  11 oz Oral BID   Continuous Infusions:  albumin human 25 g (04/05/23 1430)   linezolid (ZYVOX) IV 600 mg (04/05/23 1012)   meropenem (MERREM) IV Stopped (04/05/23 0615)     LOS: 3 days    Time spent: over 30 min    Lacretia Nicks, MD [facility]   To contact the attending provider between 7A-7P or the covering provider during after hours 7P-7A, please log into the web site [email or web address] and access using universal [place] password for that web site. If you do not have the password, please call the hospital operator.  04/05/2023, 3:30 PM

## 2023-04-06 LAB — COMPREHENSIVE METABOLIC PANEL
ALT: 16 U/L (ref 0–44)
AST: 20 U/L (ref 15–41)
Albumin: 2.5 g/dL — ABNORMAL LOW (ref 3.5–5.0)
Alkaline Phosphatase: 60 U/L (ref 38–126)
Anion gap: 9 (ref 5–15)
BUN: 61 mg/dL — ABNORMAL HIGH (ref 8–23)
CO2: 21 mmol/L — ABNORMAL LOW (ref 22–32)
Calcium: 8.3 mg/dL — ABNORMAL LOW (ref 8.9–10.3)
Chloride: 96 mmol/L — ABNORMAL LOW (ref 98–111)
Creatinine, Ser: 1.09 mg/dL (ref 0.61–1.24)
GFR, Estimated: >60 mL/min (ref >60)
Glucose, Bld: 188 mg/dL — ABNORMAL HIGH (ref 70–99)
Potassium: 4.2 mmol/L (ref 3.5–5.1)
Sodium: 126 mmol/L — ABNORMAL LOW (ref 135–145)
Total Bilirubin: 0.7 mg/dL (ref 0.3–1.2)
Total Protein: 5.6 g/dL — ABNORMAL LOW (ref 6.5–8.1)

## 2023-04-06 LAB — GLUCOSE, CAPILLARY
Glucose-Capillary: 177 mg/dL — ABNORMAL HIGH (ref 70–99)
Glucose-Capillary: 177 mg/dL — ABNORMAL HIGH (ref 70–99)
Glucose-Capillary: 228 mg/dL — ABNORMAL HIGH (ref 70–99)
Glucose-Capillary: 229 mg/dL — ABNORMAL HIGH (ref 70–99)
Glucose-Capillary: 223 mg/dL — ABNORMAL HIGH (ref 70–99)
Glucose-Capillary: 294 mg/dL — ABNORMAL HIGH (ref 70–99)

## 2023-04-06 LAB — OSMOLALITY: Osmolality: 294 mOsm/kg (ref 275–295)

## 2023-04-06 LAB — SODIUM, URINE, RANDOM: Sodium, Ur: 23 mmol/L

## 2023-04-06 LAB — CBC WITH DIFFERENTIAL/PLATELET
Abs Immature Granulocytes: 0.09 10*3/uL — ABNORMAL HIGH (ref 0.00–0.07)
Basophils Absolute: 0 10*3/uL (ref 0.0–0.1)
Basophils Relative: 0 %
Eosinophils Absolute: 0.7 10*3/uL — ABNORMAL HIGH (ref 0.0–0.5)
Eosinophils Relative: 6 %
HCT: 31.5 % — ABNORMAL LOW (ref 39.0–52.0)
Hemoglobin: 9.7 g/dL — ABNORMAL LOW (ref 13.0–17.0)
Immature Granulocytes: 1 %
Lymphocytes Relative: 13 %
Lymphs Abs: 1.6 10*3/uL (ref 0.7–4.0)
MCH: 24.3 pg — ABNORMAL LOW (ref 26.0–34.0)
MCHC: 30.8 g/dL (ref 30.0–36.0)
MCV: 78.9 fL — ABNORMAL LOW (ref 80.0–100.0)
Monocytes Absolute: 1.2 10*3/uL — ABNORMAL HIGH (ref 0.1–1.0)
Monocytes Relative: 9 %
Neutro Abs: 9.2 10*3/uL — ABNORMAL HIGH (ref 1.7–7.7)
Neutrophils Relative %: 71 %
Platelets: 357 10*3/uL (ref 150–400)
RBC: 3.99 MIL/uL — ABNORMAL LOW (ref 4.22–5.81)
RDW: 18.6 % — ABNORMAL HIGH (ref 11.5–15.5)
WBC: 12.8 10*3/uL — ABNORMAL HIGH (ref 4.0–10.5)
nRBC: 0 % (ref 0.0–0.2)

## 2023-04-06 LAB — PHOSPHORUS: Phosphorus: 3.1 mg/dL (ref 2.5–4.6)

## 2023-04-06 LAB — OSMOLALITY, URINE: Osmolality, Ur: 362 mOsm/kg (ref 300–900)

## 2023-04-06 LAB — MAGNESIUM: Magnesium: 2.1 mg/dL (ref 1.7–2.4)

## 2023-04-06 MED ORDER — MIDODRINE HCL 5 MG PO TABS
5.0000 mg | ORAL_TABLET | Freq: Three times a day (TID) | ORAL | Status: DC
  Administered 2023-04-06 – 2023-04-10 (×11): 5 mg via ORAL
  Filled 2023-04-06 (×11): qty 1

## 2023-04-06 MED ORDER — ALBUMIN HUMAN 25 % IV SOLN: 25.0000 g | Freq: Four times a day (QID) | INTRAVENOUS | Status: DC

## 2023-04-06 MED ORDER — FUROSEMIDE 10 MG/ML IJ SOLN: 40.0000 mg | Freq: Two times a day (BID) | INTRAMUSCULAR | Status: DC

## 2023-04-06 MED ORDER — HYDROMORPHONE HCL 1 MG/ML IJ SOLN: 1.0000 mg | Freq: Once | INTRAMUSCULAR | Status: DC

## 2023-04-06 MED ORDER — ALBUMIN HUMAN 25 % IV SOLN
25.0000 g | Freq: Four times a day (QID) | INTRAVENOUS | Status: AC
  Administered 2023-04-06 – 2023-04-07 (×2): 25 g via INTRAVENOUS
  Filled 2023-04-06 (×2): qty 100

## 2023-04-06 NOTE — Progress Notes (Addendum)
PROGRESS NOTE    Peter Yang  ZOX:[id number] DOB: 1956-06-09 DOA: 04/01/2023 PCP: Peter Brod, MD  Chief Complaint  Patient presents with   Urinary Retention    Brief Narrative:   67 year old male with Peter Yang history of left below-knee amputation, chronic systolic heart failure, history of Peter Yang-fib, recent admission in April 2024 at [facility] for right diabetic foot ulcer status post fifth ray amputation, hypertension, type 2 diabetes who presents to the ER today from [place] [facility] due to pus and blood from his Foley catheter.  Patient had acute urinary tension on Saturday, June 8.  He had Peter Yang Foley catheter placed at that time.  He started developing pain in his penis yesterday.  He was sent to the ER for evaluation.   Arrival temp 98.4 heart rate 100 blood pressure 95/66   White count 12.7, hemoglobin 9.8, platelets of 277   Sodium 131, potassium 4.1, bicarb 24, BUN of 32, creatinine 1.1, glucose 191   UA showed turbid colored urine.  Greater than 50 RBCs.   [facility] contacted for admission.   Upon review of his chart, patient had been admitted to [facility] back in October 2023 where he was treated with ertapenem for Peter Yang ESBL Peter Yang UTI.  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)   Long term current use of systemic steroids   Fournier disease   Necrotizing  soft tissue infection  Acute on Chronic HFrEF (heart failure with reduced ejection fraction) (HCC) Hypoalbuminemia  Anasarca  Volume overloaded on exam with dependent edema CXR with increased density in both lower lung fields, bilateral effusions, possible atelectasis/pneumonia (clinically not c/w pneumonia) Strict I/O, daily weights Cardiology notes hold off on diuresis, use lasix prn for resp symptoms or hypoxia - with worsening hyponatremia in the setting of volume overload, will restart lasix/albumin (addendum holding lasix with hypotension, will call cardiology)  Hypotension Relatively chronic it sounds On stress dose steroids, will start to wean these  Hold lisinopril, holding parameters for metop Addendum - add Midodrine, d/c metop   Hypervolemic Hyponatremia Remains grossly overloaded on exam Will start fluid restriction Will start lasix (with albumin given soft pressures) and follow  Urine sodium, urine osms, serum osms  Fournier's Gangrene  UTI  CAUTI Continue abx, appreciate pharmacy assistance, will c/s ID S/p I&D of infection scrotal with some tissue debridement Pain management, bowel regimen  Hx ESBL Peter Yang 07/2022 MRI with rim enhancing fluid and air collection within the soft tissues of the anterior pelvis at the base of the penis (compatible with abscess), moderate degereenative changes of the pubic symphysis with trace effusion, septic arthritis of pubic symphysis not excluded.  No evidence of osteo.  Diffuse myositis throughout pelvic and thigh musculature.  Discussed imaging with urology, they'll reevaluate during dressing change today - currently NPO ID planning 4 weeks abx Stop SGLT2 at discharge   History of partial  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 (HCC)\ PVD Arteriogram 3/282024 showed patent runoff to R foot per care everywhere Pt remains non-weight bearing right foot Appreciate wound care recs   S/P BKA (below knee amputation) unilateral,  left (HCC) Stable.   Gross hematuria Due to foley? This also seems to be known, history of this Eliquis currently on hold, will resume post op when ok with urology - has pending procedures, will follow  Urine culture with no growth     Type 2 diabetes mellitus with diabetic peripheral angiopathy without gangrene, without long-term current use of insulin (HCC) Continue with SSI and lantus.   History of urinary retention Foley catheter placed on Saturday 03-29-2023. Start flomax   DNR (do not resuscitate) Verified with pt that he is Peter Yang DNR/DNI.   PAF (paroxysmal atrial fibrillation) (HCC) Stable.   Essential hypertension Stable. Has hx of orthostatic hypotension.   Long term current use of systemic steroids Pt remains on cortef bid. Unclear why he is still on cortef.  Appears to have started in October/November 2023. Stress dose steroids, weaning  Prostate Cancer Follow outpatient with urology     DVT prophylaxis: SCD Code Status: DNR Family Communication: none Disposition:   Status is: Inpatient Remains inpatient appropriate because: need for continued care   Consultants:  Urology PCCM  Procedures:  Incision and drainage of infection scrotal with some tissue debridement   Antimicrobials:  Anti-infectives (From admission, onward)    Start     Dose/Rate Route Frequency Ordered Stop   04/04/23 1000  linezolid (ZYVOX) IVPB 600 mg        600 mg 300 mL/hr over 60 Minutes Intravenous Every 12 hours 04/04/23 0902     04/04/23 0200  vancomycin (VANCOCIN) IVPB 1000 mg/200 mL premix  Status:  Discontinued        1,000 mg 200 mL/hr over 60 Minutes Intravenous Every 12 hours 04/03/23 1936 04/04/23 0902   04/03/23 1400  vancomycin (VANCOREADY) IVPB 1500 mg/300 mL  Status:  Discontinued        1,500 mg 150 mL/hr over 120 Minutes Intravenous Every 12 hours 04/03/23 1302 04/03/23 1936   04/02/23 1400  vancomycin (VANCOCIN) IVPB 1000 mg/200 mL premix  Status:  Discontinued         1,000 mg 200 mL/hr over 60 Minutes Intravenous Every 12 hours 04/02/23 0036 04/03/23 1302   04/02/23 0115  vancomycin (VANCOREADY) IVPB 2000 mg/400 mL        2,000 mg 200 mL/hr over 120 Minutes Intravenous  Once 04/02/23 0015 04/02/23 0529   04/02/23 0100  clindamycin (CLEOCIN) IVPB 900 mg  Status:  Discontinued        900 mg 100 mL/hr over 30 Minutes Intravenous Every 8 hours 04/02/23 0002 04/02/23 0005   04/02/23 0100  clindamycin (CLEOCIN) IVPB 900 mg  Status:  Discontinued        900 mg 100 mL/hr over 30 Minutes Intravenous Every 8 hours 04/02/23 0012 04/04/23 0902   04/02/23 0000  meropenem (MERREM) 1 g in sodium chloride 0.9 % 100 mL IVPB        1 g 200 mL/hr over 30 Minutes Intravenous Every 8 hours 04/01/23 2352     04/01/23 2215  meropenem (MERREM) 2 g in sodium chloride 0.9 % 100 mL IVPB  Status:  Discontinued        2 g 280 mL/hr over 30 Minutes Intravenous  Once 04/01/23 2221 04/01/23 2352   04/01/23 1800  cefTRIAXone (ROCEPHIN)  1 g in sodium chloride 0.9 % 100 mL IVPB        1 g 200 mL/hr over 30 Minutes Intravenous  Once 04/01/23 1746 04/01/23 1902       Subjective: Continued groin pain  Objective: Vitals:   04/05/23 1945 04/06/23 0459 04/06/23 0611 04/06/23 0831  BP: 101/65 94/69  93/64  Pulse: 78 80    Resp: 16 18    Temp: 97.8 F (36.6 C) 98.6 F (37 C)  (!) 97.2 F (36.2 C)  TempSrc: Oral Oral  Axillary  SpO2: 98% (!) 87%    Weight:   115.9 kg   Height:        Intake/Output Summary (Last 24 hours) at 04/06/2023 0901 Last data filed at 04/06/2023 0631 Gross per 24 hour  Intake 1457.4 ml  Output 1225 ml  Net 232.4 ml   Filed Weights   04/04/23 0500 04/05/23 0313 04/06/23 0611  Weight: 102.1 kg 102.3 kg 115.9 kg    Examination:  General: No acute distress. Cardiovascular: RRR Lungs: unlabored Abdomen: Soft, nontender, nondistended GU exam deferred to urology Neurological: Alert and oriented 3. Moves all extremities 4 with equal strength.  Cranial nerves II through XII grossly intact. Extremities: dependent edema up to hips    Data Reviewed: I have personally reviewed following labs and imaging studies  CBC: Recent Labs  Lab 04/02/23 0318 04/03/23 0434 04/03/23 1830 04/04/23 0242 04/05/23 0106 04/06/23 0122  WBC 11.6* 14.4* 17.5* 15.5* 13.1* 12.8*  NEUTROABS 8.8* 13.1*  --  14.2* 11.5* 9.2*  HGB 9.5* 9.0* 9.0* 8.6* 8.8* 9.7*  HCT 31.0* 29.7* 29.7* 29.0* 28.7* 31.5*  MCV 77.5* 79.8* 80.7 79.0* 78.2* 78.9*  PLT 271 276 297 268 300 357    Basic Metabolic Panel: Recent Labs  Lab 04/03/23 0434 04/03/23 1810 04/04/23 0242 04/05/23 0106 04/06/23 0122  NA 130* 129* 127* 127* 126*  K 3.8 3.9 4.0 3.8 4.2  CL 100 97* 98 97* 96*  CO2 20* 22 20* 19* 21*  GLUCOSE 170* 231* 258* 292* 188*  BUN 32* 31* 35* 50* 61*  CREATININE 0.88 1.34* 1.25* 1.03 1.09  CALCIUM 7.6* 7.8* 7.8* 8.2* 8.3*  MG 1.5*  --  2.3 2.0 2.1  PHOS 3.7  --  3.6 3.2 3.1    GFR: Estimated Creatinine Clearance: 92.8 mL/min (by C-G formula based on SCr of 1.09 mg/dL).  Liver Function Tests: Recent Labs  Lab 04/02/23 0318 04/03/23 0434 04/04/23 0242 04/05/23 0106 04/06/23 0122  AST 21 22 21 17 20   ALT 18 17 16 16 16   ALKPHOS 78 69 70 65 60  BILITOT 0.4 0.8 0.7 0.7 0.7  PROT 5.5* 5.1* 5.1* 5.3* 5.6*  ALBUMIN 1.8* 1.7* 1.6* 2.2* 2.5*    CBG: Recent Labs  Lab 04/05/23 1210 04/05/23 1727 04/05/23 2109 04/06/23 0612 04/06/23 0834  GLUCAP 283* 231* 173* 177* 177*     Recent Results (from the past 240 hour(s))  Urine Culture     Status: None   Collection Time: 04/01/23  5:46 PM   Specimen: Urine, Catheterized  Result Value Ref Range Status   Specimen Description URINE, CATHETERIZED  Final   Special Requests NONE  Final   Culture   Final    NO GROWTH Performed at [facility], [address], [place], [place] [address]    Report Status 04/02/2023 FINAL  Final  MRSA Next Gen by PCR, Nasal     Status: Abnormal    Collection Time: 04/02/23  1:00 AM   Specimen: Nasal Mucosa; Nasal Swab  Result Value Ref Range Status   MRSA by PCR Next Gen DETECTED (Shantavia Jha) NOT DETECTED Final    Comment: RESULT CALLED TO, READ BACK BY AND VERIFIED WITH: RN KAITLYN BLUCHER ON 04/02/23 @ 1515 BY DRT (NOTE) The GeneXpert MRSA Assay (FDA approved for NASAL specimens only), is one component of Fahima Cifelli comprehensive MRSA colonization surveillance program. It is not intended to diagnose MRSA infection nor to guide or monitor treatment for MRSA infections. Test performance is not FDA approved in patients less than 84 years old. Performed at [facility], [address] [place], [place] [address]          Radiology Studies: MR PELVIS W WO CONTRAST  Result Date: 04/05/2023 CLINICAL DATA:  Necrotizing soft tissue infection. Concern for osteomyelitis EXAM: MRI PELVIS WITHOUT AND WITH CONTRAST TECHNIQUE: Multiplanar multisequence MR imaging of the pelvis was performed both before and after administration of intravenous contrast. CONTRAST:  10mL GADAVIST GADOBUTROL 1 MMOL/ML IV SOLN COMPARISON:  CT 04/01/2023 FINDINGS: Bones/Joint/Cartilage Bony pelvis intact without fracture or diastasis. Bilateral hip joints are intact without fracture or dislocation. No femoral head avascular necrosis. Small bilateral hip joint effusions, nonspecific. No sacroiliac joint effusion. Moderate degenerative changes of the pubic symphysis with trace effusion. No bone marrow edema. No erosion or marrow replacement. No bone lesion. Ligaments Intact. Muscles and Tendons Diffuse intramuscular edema throughout the pelvic and visualized thigh musculature, most severely affecting the adductor compartment muscles bilaterally. No organized intramuscular fluid collection. No acute tendinous abnormality. Soft tissues Severe body wall edema. Rim enhancing fluid and air collection within the soft tissues of the anterior pelvis at the base of the penis measuring 7.0 x 2.0  x 5.0 cm (volume = 37 cm^3). Collection abuts the anterior surface of the pubic symphysis. Small volume ascites within the pelvis. Thick walled urinary bladder, partially decompressed by Foley catheter. No organized intrapelvic fluid collection. IMPRESSION: 1. Rim-enhancing fluid and air collection within the soft tissues of the anterior pelvis at the base of the penis measuring 7.0 x 2.0 x 5.0 cm. Collection abuts the anterior surface of the pubic symphysis. Findings compatible with abscess. 2. Moderate degenerative changes of the pubic symphysis with trace effusion, likely degenerative/reactive. Septic arthritis of the pubic symphysis is not excluded given proximity of the adjacent soft tissue infection. 3. No evidence of osteomyelitis. 4. Diffuse myositis throughout the pelvic and visualized thigh musculature, most severely affecting the adductor compartment muscles bilaterally. No organized intramuscular fluid collection. 5. Small bilateral hip joint effusions, nonspecific. 6. Severe body wall edema. 7. Thick-walled urinary bladder, partially decompressed by Foley catheter. Electronically Signed   By: Duanne Guess D.O.   On: 04/05/2023 11:16        Scheduled Meds:  atorvastatin  80 mg Oral Daily   Chlorhexidine Gluconate Cloth  6 each Topical Daily   gabapentin  300 mg Oral QHS   [START ON 04/07/2023] hydrocortisone  10 mg Oral Daily   And   hydrocortisone  5 mg Oral QHS   hydrocortisone sod succinate (SOLU-CORTEF) inj  50 mg Intravenous Daily   insulin aspart  0-15 Units Subcutaneous TID WC   insulin aspart  0-5 Units Subcutaneous QHS   insulin glargine-yfgn  15 Units Subcutaneous QHS   metoprolol succinate  12.5 mg Oral Daily   mupirocin ointment  1 Application Nasal BID   nutrition supplement (JUVEN)  1 packet Oral BID BM   pantoprazole  40 mg  Oral Daily   polyethylene glycol  17 g Oral BID   Ensure Max Protein  11 oz Oral BID   Continuous Infusions:  linezolid (ZYVOX) IV Stopped  (04/05/23 2327)   meropenem (MERREM) IV 1 g (04/06/23 0442)     LOS: 4 days    Time spent: over 30 min    Lacretia Nicks, MD [facility]   To contact the attending provider between 7A-7P or the covering provider during after hours 7P-7A, please log into the web site [email or web address] and access using universal [place] password for that web site. If you do not have the password, please call the hospital operator.  04/06/2023, 9:01 AM

## 2023-04-06 NOTE — Progress Notes (Signed)
Peter Yang's BP 79/62 map 69 HR 69 after receiving Dilaudid at 1030. He had his dressing change and is complaining of pain while awake. Recheck BP 84/59, Map 68, HR 70. Dr Lowell Guitar notified.

## 2023-04-06 NOTE — Progress Notes (Signed)
4 Days Post-Op Subjective: NAEON. Packing changed on AM rounds. Fluid collection on MRI appears to communicate with open wound.  Objective: Vital signs in last 24 hours: Temp:  [97.2 F (36.2 C)-98.6 F (37 C)] 97.2 F (36.2 C) (06/16 0831) Pulse Rate:  [49-89] 67 (06/16 1142) Resp:  [15-18] 18 (06/16 0459) BP: (77-101)/(58-69) 84/59 (06/16 1141) SpO2:  [87 %-100 %] 97 % (06/16 1142) FiO2 (%):  [21 %] 21 % (06/16 0459) Weight:  [115.9 kg] 115.9 kg (06/16 0611)  Intake/Output from previous day: 06/15 0701 - 06/16 0700 In: 1457.4 [IV Piggyback:1457.4] Out: 1225 [Urine:1225]  Intake/Output this shift: No intake/output data recorded.  Physical Exam:  General: Alert and oriented CV: No cyanosis Lungs: equal chest rise Abdomen: Soft, NTND, no rebound or guarding Gu: Foley catheter in place draining yellow urine; wound packing in place, changed by Urology 6/16. No areas of necrosis noted. Scant seropurulent drainage throughout, fibrinous exudate noted diffusely  Lab Results: Recent Labs    04/04/23 0242 04/05/23 0106 04/06/23 0122  HGB 8.6* 8.8* 9.7*  HCT 29.0* 28.7* 31.5*    BMET Recent Labs    04/05/23 0106 04/06/23 0122  NA 127* 126*  K 3.8 4.2  CL 97* 96*  CO2 19* 21*  GLUCOSE 292* 188*  BUN 50* 61*  CREATININE 1.03 1.09  CALCIUM 8.2* 8.3*      Studies/Results: MR PELVIS W WO CONTRAST  Result Date: 04/05/2023 CLINICAL DATA:  Necrotizing soft tissue infection. Concern for osteomyelitis EXAM: MRI PELVIS WITHOUT AND WITH CONTRAST TECHNIQUE: Multiplanar multisequence MR imaging of the pelvis was performed both before and after administration of intravenous contrast. CONTRAST:  10mL GADAVIST GADOBUTROL 1 MMOL/ML IV SOLN COMPARISON:  CT 04/01/2023 FINDINGS: Bones/Joint/Cartilage Bony pelvis intact without fracture or diastasis. Bilateral hip joints are intact without fracture or dislocation. No femoral head avascular necrosis. Small bilateral hip joint effusions,  nonspecific. No sacroiliac joint effusion. Moderate degenerative changes of the pubic symphysis with trace effusion. No bone marrow edema. No erosion or marrow replacement. No bone lesion. Ligaments Intact. Muscles and Tendons Diffuse intramuscular edema throughout the pelvic and visualized thigh musculature, most severely affecting the adductor compartment muscles bilaterally. No organized intramuscular fluid collection. No acute tendinous abnormality. Soft tissues Severe body wall edema. Rim enhancing fluid and air collection within the soft tissues of the anterior pelvis at the base of the penis measuring 7.0 x 2.0 x 5.0 cm (volume = 37 cm^3). Collection abuts the anterior surface of the pubic symphysis. Small volume ascites within the pelvis. Thick walled urinary bladder, partially decompressed by Foley catheter. No organized intrapelvic fluid collection. IMPRESSION: 1. Rim-enhancing fluid and air collection within the soft tissues of the anterior pelvis at the base of the penis measuring 7.0 x 2.0 x 5.0 cm. Collection abuts the anterior surface of the pubic symphysis. Findings compatible with abscess. 2. Moderate degenerative changes of the pubic symphysis with trace effusion, likely degenerative/reactive. Septic arthritis of the pubic symphysis is not excluded given proximity of the adjacent soft tissue infection. 3. No evidence of osteomyelitis. 4. Diffuse myositis throughout the pelvic and visualized thigh musculature, most severely affecting the adductor compartment muscles bilaterally. No organized intramuscular fluid collection. 5. Small bilateral hip joint effusions, nonspecific. 6. Severe body wall edema. 7. Thick-walled urinary bladder, partially decompressed by Foley catheter. Electronically Signed   By: Duanne Guess D.O.   On: 04/05/2023 11:16    Assessment/Plan: No acute indication for OR  Continue daily wound changes. Plan for OR  for closure on Wed, 6/19  NPO Tues night.  Broad ABX per  primary- Merrem/Clinda  Will see as needed for wound care/assessment. Please call with questions    LOS: 4 days   04/06/2023, 1:02 PM

## 2023-04-06 NOTE — Progress Notes (Addendum)
Rounding Note    Patient Name: Peter Yang Date of Encounter: 04/06/2023  [facility] Cardiologist: Chrystie Nose, MD   Subjective   Resting flat in bed. Offers no complain other then groin pain. Clinically no change from when he was seen on 6/14.   Inpatient Medications    Scheduled Meds:  atorvastatin  80 mg Oral Daily   Chlorhexidine Gluconate Cloth  6 each Topical Daily   gabapentin  300 mg Oral QHS   [START ON 04/07/2023] hydrocortisone  10 mg Oral Daily   And   hydrocortisone  5 mg Oral QHS    HYDROmorphone (DILAUDID) injection  1 mg Intravenous Once   insulin aspart  0-15 Units Subcutaneous TID WC   insulin aspart  0-5 Units Subcutaneous QHS   insulin glargine-yfgn  15 Units Subcutaneous QHS   mupirocin ointment  1 Application Nasal BID   nutrition supplement (JUVEN)  1 packet Oral BID BM   pantoprazole  40 mg Oral Daily   polyethylene glycol  17 g Oral BID   Ensure Max Protein  11 oz Oral BID   Continuous Infusions:  albumin human     linezolid (ZYVOX) IV 600 mg (04/06/23 1037)   meropenem (MERREM) IV 1 g (04/06/23 1418)   PRN Meds: acetaminophen **OR** acetaminophen, HYDROmorphone (DILAUDID) injection, melatonin, ondansetron **OR** ondansetron (ZOFRAN) IV, oxyCODONE   Vital Signs    Vitals:   04/06/23 1140 04/06/23 1141 04/06/23 1142 04/06/23 1309  BP:  (!) 84/59  96/74  Pulse: 64 70 67 76  Resp:      Temp:      TempSrc:      SpO2: 96% 90% 97% 93%  Weight:      Height:        Intake/Output Summary (Last 24 hours) at 04/06/2023 1423 Last data filed at 04/06/2023 0631 Gross per 24 hour  Intake 1457.4 ml  Output 1225 ml  Net 232.4 ml      04/06/2023    6:11 AM 04/05/2023    3:13 AM 04/04/2023    5:00 AM  Last 3 Weights  Weight (lbs) 255 lb 8.2 oz 225 lb 8.5 oz 225 lb 1.4 oz  Weight (kg) 115.9 kg 102.3 kg 102.1 kg      Telemetry    Sinus Rhythm - Personally Reviewed  ECG    No new tracing  Physical Exam   GEN: No acute  distress, laying flat in bed on RA. Neck: No JVD Cardiac: RRR, 2/6 systolic murmur, no rubs, or gallops.  Respiratory: Clear to auscultation bilaterally. GI: Soft, nontender, non-distended  MS: Left BKA, right toe amp Neuro:  Nonfocal  Psych: Normal affect   Labs    High Sensitivity Troponin:  No results for input(s): "TROPONINIHS" in the last 720 hours.   Chemistry Recent Labs  Lab 04/04/23 0242 04/05/23 0106 04/06/23 0122  NA 127* 127* 126*  K 4.0 3.8 4.2  CL 98 97* 96*  CO2 20* 19* 21*  GLUCOSE 258* 292* 188*  BUN 35* 50* 61*  CREATININE 1.25* 1.03 1.09  CALCIUM 7.8* 8.2* 8.3*  MG 2.3 2.0 2.1  PROT 5.1* 5.3* 5.6*  ALBUMIN 1.6* 2.2* 2.5*  AST 21 17 20   ALT 16 16 16   ALKPHOS 70 65 60  BILITOT 0.7 0.7 0.7  GFRNONAA >60 >60 >60  ANIONGAP 9 11 9     Lipids No results for input(s): "CHOL", "TRIG", "HDL", "LABVLDL", "LDLCALC", "CHOLHDL" in the last 168 hours.  Hematology Recent  Labs  Lab 04/04/23 0242 04/05/23 0106 04/06/23 0122  WBC 15.5* 13.1* 12.8*  RBC 3.67* 3.67* 3.99*  HGB 8.6* 8.8* 9.7*  HCT 29.0* 28.7* 31.5*  MCV 79.0* 78.2* 78.9*  MCH 23.4* 24.0* 24.3*  MCHC 29.7* 30.7 30.8  RDW 19.0* 18.9* 18.6*  PLT 268 300 357   Thyroid No results for input(s): "TSH", "FREET4" in the last 168 hours.  BNP Recent Labs  Lab 04/04/23 0237  BNP 2,487.5*    DDimer No results for input(s): "DDIMER" in the last 168 hours.   Radiology    MR PELVIS W WO CONTRAST  Result Date: 04/05/2023 CLINICAL DATA:  Necrotizing soft tissue infection. Concern for osteomyelitis EXAM: MRI PELVIS WITHOUT AND WITH CONTRAST TECHNIQUE: Multiplanar multisequence MR imaging of the pelvis was performed both before and after administration of intravenous contrast. CONTRAST:  10mL GADAVIST GADOBUTROL 1 MMOL/ML IV SOLN COMPARISON:  CT 04/01/2023 FINDINGS: Bones/Joint/Cartilage Bony pelvis intact without fracture or diastasis. Bilateral hip joints are intact without fracture or dislocation. No  femoral head avascular necrosis. Small bilateral hip joint effusions, nonspecific. No sacroiliac joint effusion. Moderate degenerative changes of the pubic symphysis with trace effusion. No bone marrow edema. No erosion or marrow replacement. No bone lesion. Ligaments Intact. Muscles and Tendons Diffuse intramuscular edema throughout the pelvic and visualized thigh musculature, most severely affecting the adductor compartment muscles bilaterally. No organized intramuscular fluid collection. No acute tendinous abnormality. Soft tissues Severe body wall edema. Rim enhancing fluid and air collection within the soft tissues of the anterior pelvis at the base of the penis measuring 7.0 x 2.0 x 5.0 cm (volume = 37 cm^3). Collection abuts the anterior surface of the pubic symphysis. Small volume ascites within the pelvis. Thick walled urinary bladder, partially decompressed by Foley catheter. No organized intrapelvic fluid collection. IMPRESSION: 1. Rim-enhancing fluid and air collection within the soft tissues of the anterior pelvis at the base of the penis measuring 7.0 x 2.0 x 5.0 cm. Collection abuts the anterior surface of the pubic symphysis. Findings compatible with abscess. 2. Moderate degenerative changes of the pubic symphysis with trace effusion, likely degenerative/reactive. Septic arthritis of the pubic symphysis is not excluded given proximity of the adjacent soft tissue infection. 3. No evidence of osteomyelitis. 4. Diffuse myositis throughout the pelvic and visualized thigh musculature, most severely affecting the adductor compartment muscles bilaterally. No organized intramuscular fluid collection. 5. Small bilateral hip joint effusions, nonspecific. 6. Severe body wall edema. 7. Thick-walled urinary bladder, partially decompressed by Foley catheter. Electronically Signed   By: Duanne Guess D.O.   On: 04/05/2023 11:16    Cardiac Studies   N/a   Patient Profile     67 y.o. male with a hx of CAD  status post PTCA and PCI of RCA '2023, left BKA/right ray amputation, HFrEF, paroxysmal atrial fibrillation, diabetes, hypertension, hyperlipidemia, prostate CA, recurrent UTIs, bowel perforation, OSA on CPAP who is being seen 04/04/2023 for the evaluation of CHF at the request of Dr. Lowell Guitar. Seen initially on 6/14 and signed off. Cardiology asked to re-evaluate  Assessment & Plan    HFrEF ICM -- known hx of cardiomyopathy, EF of 30-35%, moderately reduced RV, mod LAE, mild RAE, moderate MR with anterior WMA on echo 08/2022. Has known multivessel CAD, not candidate for CABG or PCI. -- CXR 6/13 with bilateral pleural effusions, BNP 2487 (slightly lower than previous readings), -- albumin 1.8>>1.6>>2.5, also chronically hypotension requiring use of midodrine -- GDMT: unable to add therapy given his hypotension, of  note has been on SLGT2i prior to admission (now stopped with fournier's). Has been not a candidate from home inotropes or advance HF therapies previously -- blood pressures remain soft, lower today though suspect this may be related to use of pain medications -- not grossly volume overloaded on exam, remains on RA. No signs of cardiogenic shock -- weights have been stable at 225 (reading of 255 does not appear accurate) -- as noted by Dr. [place], would reserve lasix for respiratory symptoms or hypoxia -- can repeat echo to reassess LV/RV function to give some further insight, but again not a candidate for advanced therapies/home inotropes  -- would recommend palliative care consult for GOC moving forward   Fournier's Gangrene UTI Indwelling foley Hx of ESBL E. Coli Gross hematuria -- s/p I&D, urology following -- antibiotics per primary, ID following -- SGLT2 stopped -- urine has cleared, no hematuria noted   Hyponatremia -- Na+ 131>>129>>127>>126 -- as above, does not appear grossly overloaded  -- would evaluate for other causes of hyponatremia   Hypotension -- chronic issue,  on midodrine PTA. This has now been restarted  -- on stress dose steroids   Paroxsymal atrial fibrillation -- remains on SR -- on Eliquis 5mg  BID PTA, on hold for procedures   Moderate MR   Per Primary PVD S/p left BKA Right ray amp Urinary retention   For questions or updates, please contact [facility] Please consult [email or web address] for contact info under        Signed, Laverda Page, NP  04/06/2023, 2:23 PM    Patient seen and examined with Laverda Page NP.  Agree as above, with the following exceptions and changes as noted below.  67 year old male with complex coronary artery disease not a candidate for PCI or CABG per interventional cardiology review, and not a candidate for advanced heart failure therapies per heart failure review (it seems he may have had this evaluation at [facility] as well).  Presents with Fournier's gangrene currently being managed gen: NAD, CV: RRR, 2/6 systolic murmur, Lungs: clear, Abd: soft, Extrem: Left BKA, right toe amputation, extremities warm, no significant edema, Neuro/Psych: Sleepy but oriented x 3, normal mood and affect. All available labs, radiology testing, previous records reviewed.  I discussed frankly with the patient that he has limited cardiovascular options and no durable therapies for recovery of ejection fraction.  This will complicate ongoing management and certainly is not helping hypotension.  That being said he does not appear to be in cardiogenic shock and does not appear to be in significantly decompensated heart failure. Hypotension is likely multifactorial and seems complicated by use of pain control which is necessary for symptomatic management of his Fournier's gangrene.  At this time there is no clear indication for diuretic therapy with Lasix.  He is breathing comfortably lying flat on room air.  Patient notes that he does not have any of his usual symptoms of volume overload and is well aware of these.   Hyponatremia may also be multifactorial but does not appear to be due to cardiogenic shock/hypervolemia.  Recommend alternative workup for hyponatremia if clinical concern.  Patient will be reinitiated on midodrine which he had left the hospital with on a prior admission.  Patient's prognosis from a cardiovascular perspective is poor and I have shared this frankly with the patient who is aware but states he does not know what neck step should be.  I have recommended palliative medicine consultation and have shared with the patient  that they will be visiting him to define goals of care and define quality of life items.  I have shared with primary service that we do not have options for recovery of ejection fraction, and therefore setting goals of care in the setting of acute illness is a reasonable pursuit.  Patient has been seen by palliative medicine in the past per chart review.  In the setting of hypotension it would be reasonable to obtain a repeat echocardiogram to ensure biventricular function has not further worsened which may at least give [place] an etiology.  Parke Poisson, MD 04/06/23 3:22 PM

## 2023-04-06 NOTE — Progress Notes (Signed)
Patient c/o pain while awake but returns to sleep when not stimulated. Denies dizzyness, just pain when awake. Requesting Dilaudid, refusing Tylenol. Remains hypotensive. Dr. Lowell Guitar notified.

## 2023-04-06 NOTE — Progress Notes (Signed)
Pharmacy Antibiotic Note-Follow Up  Peter Yang is a 67 y.o. male for which pharmacy has been consulted for meropenem dosing for UTI. Patient comes from [facility] for evaluation of discolored urine and hematuria. He had an episode of urinary retention over the weekend on Saturday and foley catheter was placed. Today, staff noticed purulence and blood and was sent to ED for further evaluation.  Patient with a history of ESBL e. Coli and proteus in urine culture that was treated with IV carbapenem followed by 6 weeks of Bactrim. Patient urine culture unable to be located. MRI showed no evidence of osteo, but rim enhancing fluid/air collection compatible with abscess  SCr 1.03>1.09 (bl~0.9-1) WBC 13.1>12.8; afebrile  S/p I&D on 6/12. Vancomycin has been changed to linezolid 600mg  IV q12  Plan: Meropenem 1g IV every 8 hours Linezolid 600mg  IV every 12 hours Monitor renal function, WBC, fever curve Follow up plans for re-exploration of wound and antibiotic LOT (4 weeks after last OR procedure)   Height: 6\' 4"  (193 cm) Weight: 115.9 kg (255 lb 8.2 oz) IBW/kg (Calculated) : 86.8  Temp (24hrs), Avg:97.9 F (36.6 C), Min:97.4 F (36.3 C), Max:98.6 F (37 C)  Recent Labs  Lab 04/03/23 0434 04/03/23 1810 04/03/23 1830 04/03/23 2110 04/04/23 0242 04/05/23 0106 04/06/23 0122  WBC 14.4*  --  17.5*  --  15.5* 13.1* 12.8*  CREATININE 0.88 1.34*  --   --  1.25* 1.03 1.09  LATICACIDVEN  --   --  1.6 1.9  --   --   --      Estimated Creatinine Clearance: 92.8 mL/min (by C-G formula based on SCr of 1.09 mg/dL).    Allergies  Allergen Reactions   Sglt2 Inhibitors     Fournier's gangrene    Antimicrobials this admission: Meropenem 6/11>> Linezolid 6/14>> Vanc 6/12>> 6/14 Clinda 6/12>>6/14   Microbiology results: 07/2022 Ucx: ESBL e. Coli + proteus (I-imipenem), not to meorpenem/ertapenem 6/11 Ucx: neg MRSA+  Thanks,   Arabella Merles, PharmD. [facility]  PGY-1 04/06/2023 8:32 AM

## 2023-04-07 ENCOUNTER — Inpatient Hospital Stay (HOSPITAL_COMMUNITY): Payer: Medicare Other

## 2023-04-07 DIAGNOSIS — I5022 Chronic systolic (congestive) heart failure: Secondary | ICD-10-CM

## 2023-04-07 DIAGNOSIS — R531 Weakness: Secondary | ICD-10-CM

## 2023-04-07 DIAGNOSIS — I5030 Unspecified diastolic (congestive) heart failure: Secondary | ICD-10-CM

## 2023-04-07 DIAGNOSIS — Z515 Encounter for palliative care: Secondary | ICD-10-CM

## 2023-04-07 DIAGNOSIS — R1030 Lower abdominal pain, unspecified: Secondary | ICD-10-CM

## 2023-04-07 LAB — GLUCOSE, CAPILLARY
Glucose-Capillary: 207 mg/dL — ABNORMAL HIGH (ref 70–99)
Glucose-Capillary: 200 mg/dL — ABNORMAL HIGH (ref 70–99)
Glucose-Capillary: 137 mg/dL — ABNORMAL HIGH (ref 70–99)
Glucose-Capillary: 190 mg/dL — ABNORMAL HIGH (ref 70–99)

## 2023-04-07 LAB — CBC WITH DIFFERENTIAL/PLATELET
Abs Immature Granulocytes: 0.07 10*3/uL (ref 0.00–0.07)
Basophils Absolute: 0 10*3/uL (ref 0.0–0.1)
Basophils Relative: 0 %
Eosinophils Absolute: 0.2 10*3/uL (ref 0.0–0.5)
Eosinophils Relative: 2 %
HCT: 30.4 % — ABNORMAL LOW (ref 39.0–52.0)
Hemoglobin: 9.3 g/dL — ABNORMAL LOW (ref 13.0–17.0)
Immature Granulocytes: 1 %
Lymphocytes Relative: 9 %
Lymphs Abs: 1 10*3/uL (ref 0.7–4.0)
MCH: 23.7 pg — ABNORMAL LOW (ref 26.0–34.0)
MCHC: 30.6 g/dL (ref 30.0–36.0)
MCV: 77.4 fL — ABNORMAL LOW (ref 80.0–100.0)
Monocytes Absolute: 1 10*3/uL (ref 0.1–1.0)
Monocytes Relative: 9 %
Neutro Abs: 8.8 10*3/uL — ABNORMAL HIGH (ref 1.7–7.7)
Neutrophils Relative %: 79 %
Platelets: 321 10*3/uL (ref 150–400)
RBC: 3.93 MIL/uL — ABNORMAL LOW (ref 4.22–5.81)
RDW: 18.6 % — ABNORMAL HIGH (ref 11.5–15.5)
WBC: 11.1 10*3/uL — ABNORMAL HIGH (ref 4.0–10.5)
nRBC: 0 % (ref 0.0–0.2)

## 2023-04-07 LAB — ECHOCARDIOGRAM COMPLETE
Area-P 1/2: 4.39 cm2
Calc EF: 39.6 %
Height: 76 in
MV M vel: 3.83 m/s
MV Peak grad: 58.7 mmHg
Radius: 0.95 cm
S' Lateral: 5.2 cm
Single Plane A2C EF: 32 %
Single Plane A4C EF: 40.9 %
Weight: 4130.54 oz

## 2023-04-07 LAB — COMPREHENSIVE METABOLIC PANEL
ALT: 16 U/L (ref 0–44)
AST: 16 U/L (ref 15–41)
Albumin: 2.4 g/dL — ABNORMAL LOW (ref 3.5–5.0)
Alkaline Phosphatase: 57 U/L (ref 38–126)
Anion gap: 10 (ref 5–15)
BUN: 60 mg/dL — ABNORMAL HIGH (ref 8–23)
CO2: 22 mmol/L (ref 22–32)
Calcium: 8.3 mg/dL — ABNORMAL LOW (ref 8.9–10.3)
Chloride: 97 mmol/L — ABNORMAL LOW (ref 98–111)
Creatinine, Ser: 1.01 mg/dL (ref 0.61–1.24)
GFR, Estimated: >60 mL/min (ref >60)
Glucose, Bld: 248 mg/dL — ABNORMAL HIGH (ref 70–99)
Potassium: 3.9 mmol/L (ref 3.5–5.1)
Sodium: 129 mmol/L — ABNORMAL LOW (ref 135–145)
Total Bilirubin: 0.7 mg/dL (ref 0.3–1.2)
Total Protein: 5.3 g/dL — ABNORMAL LOW (ref 6.5–8.1)

## 2023-04-07 LAB — PHOSPHORUS: Phosphorus: 2.7 mg/dL (ref 2.5–4.6)

## 2023-04-07 LAB — MAGNESIUM: Magnesium: 2.1 mg/dL (ref 1.7–2.4)

## 2023-04-07 MED ORDER — INSULIN GLARGINE-YFGN 100 UNIT/ML ~~LOC~~ SOLN
20.0000 [IU] | Freq: Every day | SUBCUTANEOUS | Status: DC
  Administered 2023-04-07 – 2023-04-17 (×11): 20 [IU] via SUBCUTANEOUS
  Filled 2023-04-07 (×12): qty 0.2

## 2023-04-07 MED ORDER — ALBUMIN HUMAN 25 % IV SOLN
25.0000 g | Freq: Four times a day (QID) | INTRAVENOUS | Status: AC
  Administered 2023-04-07 – 2023-04-08 (×2): 25 g via INTRAVENOUS
  Filled 2023-04-07: qty 100

## 2023-04-07 NOTE — Progress Notes (Signed)
  Echocardiogram 2D Echocardiogram has been performed.  Janalyn Harder 04/07/2023, 12:01 PM

## 2023-04-07 NOTE — Progress Notes (Signed)
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  Patient had acute urinary tension on Saturday, June 8.  He had Britaney Espaillat Foley catheter placed at that time.  He started developing pain in his penis yesterday.  He was sent to the ER for evaluation.   Arrival temp 98.4 heart rate 100 blood pressure 95/66   White count 12.7, hemoglobin 9.8, platelets of 277   Sodium 131, potassium 4.1, bicarb 24, BUN of 32, creatinine 1.1, glucose 191   UA showed turbid colored urine.  Greater than 50 RBCs.   [facility] contacted for admission.   Upon review of his chart, patient had been admitted to [facility] back in October 2023 where he was treated with ertapenem for Vladimir Lenhoff ESBL E. coli UTI.  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 Note poor cardiovascular prognosis.  Palliative care consulted.   Lasix currently on hold based on prior cards recs, could consider lasix in setting of improved BP on midodrine, will monitor Echo with EF 35%, grade II diastolic dysfunction, mildly reduce RVSF, RV size moderately enlarged, severe MV regurg  Hypotension Relatively chronic it sounds On stress dose steroids, will start to wean these  Improved on midodrine  Hypervolemic Hyponatremia Remains grossly overloaded on exam Improved today, trend fluid restriction Continue albumin - lasix on hold given soft BP Urine sodium, urine osms, serum osms  Fournier's Gangrene  Continue abx, appreciate pharmacy assistance, will c/s ID S/p I&D of infection scrotal with some tissue debridement Pain management, bowel regimen  Hx ESBL E. Coli 07/2022 UTI ruled out, urine culture no growth MRI with rim enhancing fluid and air collection within the soft tissues of the anterior pelvis at the base of the penis (compatible with abscess), moderate degereenative changes of the pubic symphysis with trace effusion, septic arthritis of pubic symphysis not excluded.  No evidence of osteo.  Diffuse myositis throughout pelvic and thigh  musculature.  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   Gross hematuria Due to foley? This also seems to be known, history of this Eliquis currently on hold, will resume post op when ok with urology - has pending procedures, will follow  Urine culture with no growth     Type 2 diabetes mellitus with diabetic peripheral angiopathy without gangrene, without long-term current use of insulin (HCC) Continue with SSI and lantus.   History of urinary retention Foley catheter placed on Saturday 03-29-2023. Start flomax   DNR (do not resuscitate) Verified with pt that he is Peter Yang DNR/DNI.   PAF (paroxysmal atrial fibrillation) (HCC) Stable.   Essential hypertension Stable. Has hx of orthostatic hypotension.   Long term current use of systemic steroids Pt remains on cortef bid. Unclear why he is still on cortef.  Appears to have started in October/November 2023. Stress dose steroids, weaning  Prostate Cancer Follow outpatient with urology     DVT prophylaxis: SCD Code Status: DNR Family Communication: none Disposition:   Status is: Inpatient Remains inpatient appropriate because: need for continued care   Consultants:  Urology PCCM  Procedures:  Incision and drainage of infection scrotal with some tissue debridement   Antimicrobials:  Anti-infectives (From admission, onward)    Start     Dose/Rate Route Frequency Ordered Stop   04/04/23 1000  linezolid (ZYVOX) IVPB 600 mg        600 mg 300 mL/hr over 60 Minutes Intravenous Every 12 hours 04/04/23 0902     04/04/23 0200  vancomycin (VANCOCIN) IVPB 1000 mg/200 mL premix  Status:  Discontinued        1,000 mg 200 mL/hr over 60 Minutes Intravenous Every 12 hours 04/03/23 1936 04/04/23 0902    04/03/23 1400  vancomycin (VANCOREADY) IVPB 1500 mg/300 mL  Status:  Discontinued        1,500 mg 150 mL/hr over 120 Minutes Intravenous Every 12 hours 04/03/23 1302 04/03/23 1936   04/02/23 1400  vancomycin (VANCOCIN) IVPB 1000 mg/200 mL premix  Status:  Discontinued        1,000 mg 200 mL/hr over 60 Minutes Intravenous Every 12 hours 04/02/23 0036 04/03/23 1302   04/02/23 0115  vancomycin (VANCOREADY) IVPB 2000 mg/400 mL        2,000 mg 200 mL/hr over 120 Minutes Intravenous  Once 04/02/23 0015 04/02/23 0529   04/02/23 0100  clindamycin (CLEOCIN) IVPB 900 mg  Status:  Discontinued        900 mg 100 mL/hr over 30 Minutes Intravenous Every 8 hours 04/02/23 0002 04/02/23 0005   04/02/23 0100  clindamycin (CLEOCIN) IVPB 900 mg  Status:  Discontinued        900 mg 100 mL/hr over 30 Minutes Intravenous Every 8 hours 04/02/23 0012 04/04/23 0902   04/02/23 0000  meropenem (MERREM) 1 g in sodium chloride 0.9 % 100 mL IVPB        1 g 200 mL/hr over 30 Minutes Intravenous Every 8 hours 04/01/23 2352  04/01/23 2215  meropenem (MERREM) 2 g in sodium chloride 0.9 % 100 mL IVPB  Status:  Discontinued        2 g 280 mL/hr over 30 Minutes Intravenous  Once 04/01/23 2221 04/01/23 2352   04/01/23 1800  cefTRIAXone (ROCEPHIN) 1 g in sodium chloride 0.9 % 100 mL IVPB        1 g 200 mL/hr over 30 Minutes Intravenous  Once 04/01/23 1746 04/01/23 1902       Subjective: Continued groin pain, no new complaints  Objective: Vitals:   04/07/23 1214 04/07/23 1400 04/07/23 1500 04/07/23 1522  BP: 110/69 109/73 108/79   Pulse: 75     Resp: 18 18 20 20   Temp: 97.7 F (36.5 C)     TempSrc: Oral     SpO2: 97%     Weight:      Height:        Intake/Output Summary (Last 24 hours) at 04/07/2023 1709 Last data filed at 04/07/2023 1500 Gross per 24 hour  Intake 1020 ml  Output 2700 ml  Net -1680 ml   Filed Weights   04/05/23 0313 04/06/23 0611 04/07/23 0708  Weight: 102.3 kg 115.9 kg 117.1 kg     Examination:  General: No acute distress. Cardiovascular: RRR Lungs: unlabored Abdomen: Soft, nontender, nondistended Neurological: Alert and oriented 3. Moves all extremities 4 with equal strength. Cranial nerves II through XII grossly intact. Extremities: continued edema to bilateral hips and lower extremities, LUE    Data Reviewed: I have personally reviewed following labs and imaging studies  CBC: Recent Labs  Lab 04/03/23 0434 04/03/23 1830 04/04/23 0242 04/05/23 0106 04/06/23 0122 04/07/23 0135  WBC 14.4* 17.5* 15.5* 13.1* 12.8* 11.1*  NEUTROABS 13.1*  --  14.2* 11.5* 9.2* 8.8*  HGB 9.0* 9.0* 8.6* 8.8* 9.7* 9.3*  HCT 29.7* 29.7* 29.0* 28.7* 31.5* 30.4*  MCV 79.8* 80.7 79.0* 78.2* 78.9* 77.4*  PLT 276 297 268 300 357 321    Basic Metabolic Panel: Recent Labs  Lab 04/03/23 0434 04/03/23 1810 04/04/23 0242 04/05/23 0106 04/06/23 0122 04/07/23 0135  NA 130* 129* 127* 127* 126* 129*  K 3.8 3.9 4.0 3.8 4.2 3.9  CL 100 97* 98 97* 96* 97*  CO2 20* 22 20* 19* 21* 22  GLUCOSE 170* 231* 258* 292* 188* 248*  BUN 32* 31* 35* 50* 61* 60*  CREATININE 0.88 1.34* 1.25* 1.03 1.09 1.01  CALCIUM 7.6* 7.8* 7.8* 8.2* 8.3* 8.3*  MG 1.5*  --  2.3 2.0 2.1 2.1  PHOS 3.7  --  3.6 3.2 3.1 2.7    GFR: Estimated Creatinine Clearance: 100.6 mL/min (by C-G formula based on SCr of 1.01 mg/dL).  Liver Function Tests: Recent Labs  Lab 04/03/23 0434 04/04/23 0242 04/05/23 0106 04/06/23 0122 04/07/23 0135  AST 22 21 17 20 16   ALT 17 16 16 16 16   ALKPHOS 69 70 65 60 57  BILITOT 0.8 0.7 0.7 0.7 0.7  PROT 5.1* 5.1* 5.3* 5.6* 5.3*  ALBUMIN 1.7* 1.6* 2.2* 2.5* 2.4*    CBG: Recent Labs  Lab 04/06/23 1306 04/06/23 1807 04/06/23 2059 04/07/23 0557 04/07/23 1216  GLUCAP 228* 223* 294* 207* 200*     Recent Results (from the past 240 hour(s))  Urine Culture     Status: None   Collection Time: 04/01/23  5:46 PM   Specimen: Urine, Catheterized  Result Value Ref Range  Status   Specimen Description URINE, CATHETERIZED  Final   Special Requests NONE  Final   Culture   Final    NO GROWTH Performed at [facility], [address], [place], [place] [address]    Report Status 04/02/2023 FINAL  Final  MRSA Next Gen by PCR, Nasal     Status: Abnormal   Collection Time: 04/02/23  1:00 AM   Specimen: Nasal Mucosa; Nasal Swab  Result Value Ref Range Status   MRSA by PCR Next Gen DETECTED ([place]) NOT DETECTED Final    Comment: RESULT CALLED TO, READ BACK BY AND VERIFIED WITH: RN KAITLYN BLUCHER ON 04/02/23 @ 1515 BY DRT (NOTE) The GeneXpert MRSA Assay (FDA approved for NASAL specimens only), is one component of Masyn Fullam comprehensive MRSA colonization surveillance program. It is not intended to diagnose MRSA infection nor to guide or monitor treatment for MRSA infections. Test performance is not FDA approved in patients less than 37 years old. Performed at [facility], [address] [place], [place] [address]          Radiology Studies: ECHOCARDIOGRAM COMPLETE  Result Date: 04/07/2023    ECHOCARDIOGRAM REPORT   Patient Name:   Peter Yang Date of Exam: 04/07/2023 Medical Rec #:  [id number]      Height:       76.0 in Accession #:    [id number]     Weight:       258.2 lb Date of Birth:  [birth year]       BSA:          2.469 m Patient Age:    67 years       BP:           116/72 mmHg Patient Gender: M              HR:           77 bpm. Exam Location:  Inpatient Procedure: 2D Echo, 3D Echo, Cardiac Doppler and Color Doppler Indications:    I50.40* Unspecified combined systolic (congestive) and diastolic                 (congestive) heart failure  History:        Patient has prior history of Echocardiogram examinations, most                 recent 09/03/2022. Cardiomyopathy, CAD, Abnormal ECG, Mitral                 Valve Disease, Arrythmias:Atrial Fibrillation; Risk                 Factors:Diabetes and Dyslipidemia.  Sonographer:    Sheralyn Boatman RDCS Referring  Phys: [phone] LINDSAY B ROBERTS IMPRESSIONS  1. Left ventricular ejection fraction, by estimation, is 35%. The left ventricle has moderately decreased function. The left ventricle demonstrates regional wall motion abnormalities (see scoring diagram/findings for description). Left ventricular diastolic parameters are consistent with Grade II diastolic dysfunction (pseudonormalization).  2. Right ventricular systolic function is mildly reduced. The right ventricular size is moderately enlarged. There is normal pulmonary artery systolic pressure.  3. The mitral valve is normal in structure. Severe mitral valve regurgitation secondary to restriction of the posterior leaflet.  4. The aortic valve is normal in structure. There is mild thickening of the aortic valve. Aortic valve regurgitation is trivial.  5. There is mild dilatation of the ascending aorta, measuring 41 mm.  6. Left atrial size was mildly dilated. FINDINGS  Left Ventricle: Left ventricular ejection fraction, by estimation, is 30 to 35%. The left  ventricle has moderately decreased function. The left ventricle demonstrates regional wall motion abnormalities. The left ventricular internal cavity size was normal in size. There is no left ventricular hypertrophy. Left ventricular diastolic parameters are consistent with Grade II diastolic dysfunction (pseudonormalization).  LV Wall Scoring: The inferior septum, posterior wall, and apex are hypokinetic. Right Ventricle: The right ventricular size is moderately enlarged. No increase in right ventricular wall thickness. Right ventricular systolic function is mildly reduced. There is normal pulmonary artery systolic pressure. The tricuspid regurgitant velocity is 2.50 m/s, and with an assumed right atrial pressure of 8 mmHg, the estimated right ventricular systolic pressure is 33.0 mmHg. Left Atrium: Left atrial size was mildly dilated. Right Atrium: Right atrial size was normal in size. Pericardium: There is no  evidence of pericardial effusion. Mitral Valve: The mitral valve is normal in structure. Severe mitral valve regurgitation. MV peak gradient, 3.3 mmHg. The mean mitral valve gradient is 1.0 mmHg. Tricuspid Valve: The tricuspid valve is normal in structure. Tricuspid valve regurgitation is mild. Aortic Valve: The aortic valve is normal in structure. There is mild thickening of the aortic valve. Aortic valve regurgitation is trivial. Pulmonic Valve: The pulmonic valve was normal in structure. Pulmonic valve regurgitation is not visualized. Aorta: The aortic root is normal in size and structure. There is mild dilatation of the ascending aorta, measuring 41 mm. IAS/Shunts: The interatrial septum was not assessed. Additional Comments: There is pleural effusion in the left lateral region.  LEFT VENTRICLE PLAX 2D LVIDd:         5.70 cm      Diastology LVIDs:         5.20 cm      LV e' medial:    5.55 cm/s LV PW:         1.20 cm      LV E/e' medial:  17.5 LV IVS:        1.30 cm      LV e' lateral:   3.81 cm/s LVOT diam:     2.70 cm      LV E/e' lateral: 25.5 LVOT Area:     5.73 cm  LV Volumes (MOD) LV vol d, MOD A2C: 206.5 ml 3D Volume EF: LV vol d, MOD A4C: 193.0 ml 3D EF:        24 % LV vol s, MOD A2C: 140.5 ml LV EDV:       231 ml LV vol s, MOD A4C: 114.0 ml LV ESV:       175 ml LV SV MOD A2C:     66.0 ml  LV SV:        56 ml LV SV MOD A4C:     193.0 ml LV SV MOD BP:      83.5 ml RIGHT VENTRICLE            IVC RV S prime:     4.20 cm/s  IVC diam: 1.90 cm TAPSE (M-mode): 0.7 cm LEFT ATRIUM           Index        RIGHT ATRIUM           Index LA diam:      4.80 cm 1.94 cm/m   RA Area:     13.60 cm LA Vol (A2C): 55.9 ml 22.64 ml/m  RA Volume:   31.80 ml  12.88 ml/m LA Vol (A4C): 54.3 ml 21.99 ml/m  PULMONIC VALVE AORTA                 PR End Diast Vel: 2.51 msec Ao Root diam: 4.60 cm Ao Asc diam:  4.00 cm MITRAL VALVE                  TRICUSPID VALVE MV Area (PHT): 4.39 cm       TR Peak grad:    25.0 mmHg MV Peak grad:  3.3 mmHg       TR Vmax:        250.00 cm/s MV Mean grad:  1.0 mmHg MV Vmax:       0.91 m/s       SHUNTS MV Vmean:      53.8 cm/s      Systemic Diam: 2.70 cm MV Decel Time: 173 msec MR Peak grad:    58.7 mmHg MR Mean grad:    38.0 mmHg MR Vmax:         383.00 cm/s MR Vmean:        289.0 cm/s MR PISA:         5.67 cm MR PISA Eff ROA: 57 mm MR PISA Radius:  0.95 cm MV E velocity: 97.10 cm/s Aditya Sabharwal Electronically signed by Dorthula Nettles Signature Date/Time: 04/07/2023/4:53:51 PM    Final         Scheduled Meds:  atorvastatin  80 mg Oral Daily   Chlorhexidine Gluconate Cloth  6 each Topical Daily   gabapentin  300 mg Oral QHS   hydrocortisone  10 mg Oral Daily   And   hydrocortisone  5 mg Oral QHS    HYDROmorphone (DILAUDID) injection  1 mg Intravenous Once   insulin aspart  0-15 Units Subcutaneous TID WC   insulin aspart  0-5 Units Subcutaneous QHS   insulin glargine-yfgn  20 Units Subcutaneous QHS   midodrine  5 mg Oral TID with meals   mupirocin ointment  1 Application Nasal BID   nutrition supplement (JUVEN)  1 packet Oral BID BM   pantoprazole  40 mg Oral Daily   polyethylene glycol  17 g Oral BID   Ensure Max Protein  11 oz Oral BID   Continuous Infusions:  linezolid (ZYVOX) IV 600 mg (04/07/23 0810)   meropenem (MERREM) IV 1 g (04/07/23 1250)     LOS: 5 days    Time spent: over 30 min    Lacretia Nicks, MD [facility]   To contact the attending provider between 7A-7P or the covering provider during after hours 7P-7A, please log into the web site [email or web address] and access using universal [place] password for that web site. If you do not have the password, please call the hospital operator.  04/07/2023, 5:09 PM

## 2023-04-07 NOTE — Progress Notes (Signed)
5 Days Post-Op Subjective: Peter Yang. Packing changed on AM rounds.  Patient is extremely lethargic and refuses to attempt to participate in care, answer questions, rollover, etc.  Objective: Vital signs in last 24 hours: Temp:  [97.7 F (36.5 C)-98.3 F (36.8 C)] 97.7 F (36.5 C) (06/17 1214) Pulse Rate:  [73-75] 75 (06/17 1214) Resp:  [14-21] 18 (06/17 1214) BP: (80-116)/(59-74) 110/69 (06/17 1214) SpO2:  [96 %-98 %] 97 % (06/17 1214) Weight:  [117.1 kg] 117.1 kg (06/17 0708)  Intake/Output from previous day: 06/16 0701 - 06/17 0700 In: 610.4 [IV Piggyback:610.4] Out: 2150 [Urine:2150]  Intake/Output this shift: Total I/O In: -  Out: 550 [Urine:550]  Physical Exam:  General: Alert and oriented CV: No cyanosis Lungs: equal chest rise Abdomen: Soft, NTND, no rebound or guarding Gu: Foley catheter in place draining yellow urine; wound packing in place.  Catheter under tension, diffuse anasarca throughout thighs and groin. Urethral erosion. Foley strap removed.  No areas of necrosis noted. Beefy pink tissue throughout. No purulent drainage.   Lab Results: Recent Labs    04/05/23 0106 04/06/23 0122 04/07/23 0135  HGB 8.8* 9.7* 9.3*  HCT 28.7* 31.5* 30.4*   BMET Recent Labs    04/06/23 0122 04/07/23 0135  NA 126* 129*  K 4.2 3.9  CL 96* 97*  CO2 21* 22  GLUCOSE 188* 248*  BUN 61* 60*  CREATININE 1.09 1.01  CALCIUM 8.3* 8.3*     Studies/Results: No results found.  Assessment/Plan: No acute indication for OR  Continue daily wound changes. Plan for OR for closure on Wed, 6/19  Broad ABX per primary- Merrem/Clinda  Will see as needed for wound care/assessment. Please call with questions   Fluid collection communicating with open pelvic wound.  There is diffuse anasarca throughout the thighs and groin.  After wound care was completed today, patient's linens were changed with nursing and it was noted that multiple pads had been soaked through from weeping.   Presumably the defect in the pubic area is collecting some of this leaking fluid.   LOS: 5 days   04/07/2023, 2:21 PM

## 2023-04-07 NOTE — Plan of Care (Signed)
  Problem: Nutritional: Goal: Maintenance of adequate nutrition will improve Outcome: Progressing   Problem: Education: Goal: Knowledge of General Education information will improve Description: Including pain rating scale, medication(s)/side effects and non-pharmacologic comfort measures Outcome: Progressing   Problem: Coping: Goal: Level of anxiety will decrease Outcome: Progressing

## 2023-04-07 NOTE — Consult Note (Signed)
Consultation Note Date: 04/07/2023   Patient Name: Peter Yang  DOB: [birth year]  MRN: [id number]  Age / Sex: 67 y.o., male  PCP: Annita Brod, MD Referring Physician: Zigmund Daniel., *  Reason for Consultation: Establishing goals of care and Psychosocial/spiritual support  HPI/Patient Profile: 67 y.o. male  admitted on 04/01/2023 with  A past medical  history of left below-knee amputation, chronic systolic heart failure, history of A-fib, recent admission in April 2024 at [facility] for right diabetic foot ulcer status post amputation, hypertension, type 2 diabetes who presents to the ER today from [facility] due to pus and blood from his Foley catheter.    Patient had acute urinary tension on Saturday, June 8.  He had a Foley catheter placed at that time.  He started developing pain in his penis yesterday.  He was sent to the ER for evaluation.    Patient faces treatment option decisions, advanced directives and anticipatory care needs.     Clinical Assessment and Goals of Care:  This NP Lorinda Creed reviewed medical records, received report from team, assessed the patient and then meet at the patient's bedside          to discuss diagnosis, prognosis, GOC, EOL wishes disposition and options.   Concept of Palliative Care was introduced as specialized medical care for people and their families living with serious illness.  If focuses on providing relief from the symptoms and stress of a serious illness.  The goal is to improve quality of life for both the patient and the family.  Values and goals of care important to patient were attempted to be elicited.  Created space and opportunity for patient   to explore his thoughts and feelings regarding current medical situation.  Peter Yang expresses his frustration with his situation. "I've never experienced anything like this in my  life"  He is open to ongoing interventions to treat the treatable an remains hopeful for improvement   We discussed utilization of his medications for pain and need to work with and ask for prns.     A  discussion was had today regarding advanced directives.    Concepts specific to code status, artifical feeding and hydration, continued IV antibiotics and rehospitalization was had.  MOST form explored  The difference between a aggressive medical intervention path  and a palliative comfort care path for this patient at this time was had.     Discussed a patient's right and autonomy to make decisions regarding his medical treatment plan as they relate to his personal goals and values.     Questions and concerns addressed.  Patient  encouraged to call with questions or concerns.     PMT will continue to support holistically.        Patient has no documented HPOA and doesn't wish to name one.  He has a Living WIll.   He stresses he does not want anyone to ever make decisions for him.  In the event that he does not  have medical decision making capacity he tells me "at that time let me die"     SUMMARY OF RECOMMENDATIONS    Code Status/Advance Care Planning: DNR/DNI   Symptom Management:  Per attending- Dilaudid/Oxycodone  Palliative Prophylaxis:  Pain assessment     Psycho-social/Spiritual:  Desire for further Chaplaincy support: no -declined Additional Recommendations: emotional support  Prognosis:  Unable to determine   Discharge Planning:  to be determined       Primary Diagnoses: Present on Admission:  UTI (urinary tract infection) due to urinary indwelling catheter (HCC)  Gross hematuria  PAF (paroxysmal atrial fibrillation) (HCC)  Chronic HFrEF (heart failure with reduced ejection fraction) (HCC)  Essential hypertension  DNR (do not resuscitate)  Type 2 diabetes mellitus with diabetic peripheral angiopathy without gangrene, without long-term current use of  insulin (HCC)  Fournier disease   I have reviewed the medical record, interviewed the patient and family, and examined the patient. The following aspects are pertinent.  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 Overview:  Added automatically from request for surgery [id number] Formatting of this note might be different from the original. Added automatically from request for surgery [id number]   Large bowel perforation (HCC) 09/27/2015   Malignant neoplasm of prostate (HCC) 10/22/2019   Myopia with astigmatism and presbyopia, bilateral 06/15/2018   Last Assessment & Plan:  Formatting of this note might be different from the original. - Wrx printed Formatting of this note might be different from the original. 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 - Stable, monitor Formatting of this note might be different from the original. Last Assessment & Plan:   - Stable, monitor   Retroperitoneal abscess (HCC) 10/02/2015   Status post corneal transplant 10/07/2016   Formatting of this note might be different from the original. Overview:  Added automatically from request for surgery [id number] Formatting of this note might be different from the original. Added automatically from request for surgery [id number]  Last Assessment & Plan:  Formatting of this note might be different from the original. - OD, 2/2 KCN - Stable, no NVK / rejection - Continue off steroids - Mo   Thoracic ascending aortic aneurysm (HCC)    TIA (transient ischemic attack) 2012   Type 2 diabetes mellitus (HCC)    Social History   Socioeconomic History   Marital status: Single    Spouse name: Not on file   Number of children: Not on  file   Years of education: Not on file   Highest education level: Not on file  Occupational History   Not on file  Tobacco Use   Smoking status: Never   Smokeless tobacco: Never  Substance and Sexual Activity   Alcohol use: Never   Drug use: Never   Sexual activity: Not on file  Other Topics Concern   Not on file  Social History Narrative   Not on file   Social Determinants of Health   Financial Resource Strain: Not on file  Food Insecurity: No Food Insecurity (04/02/2023)   Hunger Vital Sign    Worried About Running Out of Food in the Last Year: Never true    Ran Out of Food in the Last Year: Never true  Transportation Needs: No Transportation Needs (04/02/2023)   PRAPARE - Administrator, Civil Service (Medical): No    Lack of Transportation (Non-Medical): No  Physical Activity: Not on file  Stress: Not on file  Social Connections: Not on file   Family History  Problem Relation Age of Onset   Diabetes Mother    Hypertension Mother    Heart disease Mother    Cancer Father    Heart attack Sister    Cancer Sister    Diabetes Brother    Scheduled Meds:  atorvastatin  80 mg Oral Daily   Chlorhexidine Gluconate Cloth  6  each Topical Daily   gabapentin  300 mg Oral QHS   hydrocortisone  10 mg Oral Daily   And   hydrocortisone  5 mg Oral QHS    HYDROmorphone (DILAUDID) injection  1 mg Intravenous Once   insulin aspart  0-15 Units Subcutaneous TID WC   insulin aspart  0-5 Units Subcutaneous QHS   insulin glargine-yfgn  20 Units Subcutaneous QHS   midodrine  5 mg Oral TID with meals   mupirocin ointment  1 Application Nasal BID   nutrition supplement (JUVEN)  1 packet Oral BID BM   pantoprazole  40 mg Oral Daily   polyethylene glycol  17 g Oral BID   Ensure Max Protein  11 oz Oral BID   Continuous Infusions:  linezolid (ZYVOX) IV 600 mg (04/07/23 0810)   meropenem (MERREM) IV 1 g (04/07/23 0353)   PRN Meds:.acetaminophen **OR** acetaminophen, HYDROmorphone (DILAUDID) injection, melatonin, ondansetron **OR** ondansetron (ZOFRAN) IV, oxyCODONE Medications Prior to Admission:  Prior to Admission medications   Medication Sig Start Date End Date Taking? Authorizing Provider  acetaminophen (TYLENOL) 325 MG tablet Take 2 tablets (650 mg total) by mouth every 6 (six) hours as needed for mild pain (or Fever >/= 101). 07/30/21  Yes Sheikh, Omair Latif, DO  albuterol (PROVENTIL) (2.5 MG/3ML) 0.083% nebulizer solution Take 2.5 mg by nebulization every 4 (four) hours as needed for wheezing or shortness of breath.   Yes [provider]  albuterol (VENTOLIN HFA) 108 (90 Base) MCG/ACT inhaler Inhale 2 puffs into the lungs every 4 (four) hours as needed for wheezing or shortness of breath.   Yes [provider]  Amino Acids-Protein Hydrolys (FEEDING SUPPLEMENT, PRO-STAT SUGAR FREE 64,) LIQD Take 30 mLs by mouth daily.   Yes [provider]  apixaban (ELIQUIS) 5 MG TABS tablet Take 1 tablet (5 mg total) by mouth 2 (two) times daily. 08/16/22 04/02/23 Yes Levin Erp, MD  aspirin EC 81 MG tablet Take 81 mg by mouth daily. Swallow whole.   Yes [provider]  atorvastatin (LIPITOR) 80  MG tablet Take  80 mg by mouth every evening. 03/17/21  Yes [provider]  B Complex-C (B-COMPLEX WITH VITAMIN C) tablet Take 1 tablet by mouth daily. 10/24/22  Yes Lanae Boast, MD  bisacodyl (DULCOLAX) 10 MG suppository Place 1 suppository (10 mg total) rectally daily as needed for moderate constipation. 10/24/22  Yes Lanae Boast, MD  cephALEXin (KEFLEX) 500 MG capsule Take 500 mg by mouth 3 (three) times daily. Starting 03/30/23 - 04/06/23   Yes [provider]  diclofenac Sodium (VOLTAREN) 1 % GEL Apply 1 g topically daily. Apply to bilateral knees 07/20/21  Yes [provider]  empagliflozin (JARDIANCE) 10 MG TABS tablet Take 5 mg by mouth daily at 12 noon.   Yes [provider]  ferrous sulfate 325 (65 FE) MG tablet Take 1 tablet (325 mg total) by mouth daily with breakfast. 10/25/22  Yes Kc, Ramesh, MD  finasteride (PROSCAR) 5 MG tablet Take 5 mg by mouth daily.   Yes [provider]  furosemide (LASIX) 20 MG tablet Take 1 tablet (20 mg total) by mouth daily for 1 day. Patient taking differently: Take 20 mg by mouth daily. PRN order: take 20 mg by mouth once daily as needed for swelling 10/24/22 04/02/23 Yes Kc, Dayna Barker, MD  gabapentin (NEURONTIN) 300 MG capsule Take 300 mg by mouth 3 (three) times daily. 07/20/21  Yes [provider]  hydrocortisone (CORTEF) 10 MG tablet Take 1 tablet (10 mg total) by mouth daily. 10/24/22  Yes Lanae Boast, MD  hydrocortisone (CORTEF) 5 MG tablet Take 1 tablet (5 mg total) by mouth every evening. 10/24/22  Yes Kc, Dayna Barker, MD  insulin glargine (LANTUS SOLOSTAR) 100 UNIT/ML Solostar Pen Inject 15 Units into the skin at bedtime.   Yes [provider]  lisinopril (ZESTRIL) 2.5 MG tablet Take 2.5 mg by mouth daily.   Yes [provider]  Magnesium 400 MG TABS Take 400 mg by mouth in the morning and at bedtime.   Yes [provider]  melatonin 5 MG TABS Take 1 tablet (5 mg total) by mouth daily at 6  PM. Patient taking differently: Take 5 mg by mouth every evening. 10/24/22  Yes Lanae Boast, MD  metFORMIN (GLUCOPHAGE) 1000 MG tablet Take 1,000 mg by mouth 2 (two) times daily with a meal.   Yes [provider]  metoprolol succinate (TOPROL-XL) 25 MG 24 hr tablet Take 12.5 mg by mouth daily.   Yes [provider]  Miconazole POWD Apply 1 Application topically in the morning and at bedtime.   Yes [provider]  OXYGEN Inhale 2 L into the lungs continuous. To maintain stats above 90%  PRN order: use as needed for shortness of breath while laying flat on exertion   Yes [provider]  pantoprazole (PROTONIX) 40 MG tablet Take 1 tablet (40 mg total) by mouth daily. 06/30/22  Yes Pokhrel, Laxman, MD  polyethylene glycol (MIRALAX / GLYCOLAX) 17 g packet Take 17 g by mouth daily as needed for mild constipation. 10/24/22  Yes Lanae Boast, MD  Propylene Glycol (SYSTANE BALANCE) 0.6 % SOLN Place 1 drop into both eyes every 4 (four) hours as needed (dry eye).   Yes [provider]  sodium chloride (OCEAN) 0.65 % SOLN nasal spray Place 1 spray into both nostrils every 6 (six) hours as needed for congestion.   Yes [provider]  tamsulosin (FLOMAX) 0.4 MG CAPS capsule Take 0.4 mg by mouth daily. 07/29/22  Yes [provider]  traMADol (  ULTRAM) 50 MG tablet Take 50 mg by mouth every 8 (eight) hours as needed for moderate pain.   Yes [provider]  traZODone (DESYREL) 100 MG tablet Take 50 mg by mouth at bedtime.   Yes [provider]  zinc oxide 20 % ointment Apply 1 Application topically in the morning, at noon, and at bedtime. Apply to buttocks   Yes [provider]  clopidogrel (PLAVIX) 75 MG tablet Take 1 tablet (75 mg total) by mouth daily. Patient not taking: Reported on 04/02/2023 08/16/22   Levin Erp, MD  cyanocobalamin 1000 MCG tablet Take 1 tablet (1,000 mcg total) by mouth daily. Patient not taking:  Reported on 04/02/2023 10/24/22   Lanae Boast, MD  feeding supplement (ENSURE ENLIVE / ENSURE PLUS) LIQD Take 237 mLs by mouth 3 (three) times daily between meals. Patient not taking: Reported on 04/02/2023 10/24/22   Lanae Boast, MD  metFORMIN (GLUCOPHAGE) 500 MG tablet Take 1,000 mg by mouth 2 (two) times daily with a meal. Patient not taking: Reported on 04/02/2023    [provider]  midodrine (PROAMATINE) 5 MG tablet Take 1 tablet (5 mg total) by mouth with breakfast, with lunch, and with evening meal. Patient not taking: Reported on 04/02/2023 10/24/22   Lanae Boast, MD  Multiple Vitamins-Minerals (DECUBI-VITE PO) Take 1 capsule by mouth daily. Patient not taking: Reported on 04/02/2023    [provider]  nystatin (MYCOSTATIN/NYSTOP) powder Apply topically 2 (two) times daily. Patient not taking: Reported on 04/02/2023 10/24/22   Lanae Boast, MD  oxyCODONE (OXY IR/ROXICODONE) 5 MG immediate release tablet Take 1 tablet (5 mg total) by mouth every 12 (twelve) hours as needed for up to 4 doses for moderate pain, severe pain or breakthrough pain. Patient not taking: Reported on 04/02/2023 10/24/22   Lanae Boast, MD  potassium chloride (KLOR-CON) 10 MEQ tablet Take 10 mEq by mouth. Patient not taking: Reported on 04/02/2023    [provider]  saccharomyces boulardii (FLORASTOR) 250 MG capsule Take 250 mg by mouth 2 (two) times daily. Patient not taking: Reported on 04/02/2023    [provider]  senna-docusate (SENOKOT-S) 8.6-50 MG tablet Take 1 tablet by mouth 2 (two) times daily. Patient not taking: Reported on 04/02/2023 10/24/22   Lanae Boast, MD   Allergies  Allergen Reactions   Sglt2 Inhibitors     Fournier's gangrene   Review of Systems  Genitourinary:        Reports pain  'in my crouch"    Physical Exam Constitutional:      Appearance: He is normal weight. He is ill-appearing.  Cardiovascular:     Rate and Rhythm: Normal rate.  Pulmonary:     Effort: Pulmonary  effort is normal.  Musculoskeletal:     Comments: Generalized weakness and muscle atrophy  Skin:    General: Skin is warm and dry.  Neurological:     Mental Status: He is lethargic.     Vital Signs: BP 110/69 (BP Location: Left Arm)   Pulse 75   Temp 97.7 F (36.5 C) (Oral)   Resp 18   Ht 6\' 4"  (1.93 m)   Wt 117.1 kg   SpO2 97%   BMI 31.42 kg/m  Pain Scale: 0-10 POSS *See Group Information*: S-Acceptable,Sleep, easy to arouse Pain Score: 9    SpO2: SpO2: 97 % O2 Device:SpO2: 97 % O2 Flow Rate: .O2 Flow Rate (L/min): 5 L/min  IO: Intake/output summary:  Intake/Output Summary (Last 24 hours) at 04/07/2023  1228 Last data filed at 04/07/2023 0600 Gross per 24 hour  Intake 610.39 ml  Output 2150 ml  Net -1539.61 ml    LBM: Last BM Date : 04/01/23 Baseline Weight: Weight: 118.1 kg Most recent weight: Weight: 117.1 kg     Palliative Assessment/Data: 40 %    Time 75 minutes  Extensive chart review has been completed  including labs, vital signs, imaging, progress/consult notes, orders, medications and available advance directive documents.     Signed by: Lorinda Creed, NP   Please contact Palliative Medicine Team phone at [phone] for questions and concerns.  For individual provider: See Loretha Stapler

## 2023-04-07 NOTE — Progress Notes (Signed)
No new recs at this time. Please let us know if we can be of further assistance. Please see 6/16 note.   Donato Schultz, MD

## 2023-04-07 NOTE — TOC Progression Note (Addendum)
Transition of Care [facility]) - Progression Note    Patient Details  Name: Keylor Bonifield MRN: [id number] Date of Birth: 09/30/1956  Transition of Care [facility]) CM/SW Contact  Eduard Roux, [place] Phone Number: 04/07/2023, 1:45 PM  Clinical Narrative:     CSW met with patient on 06/14- he confirmed he was from [facility]  and is agreeable to returning once he is medically stable.    Updated [name] today- patient not ready for d/c at this time.    TOC will continue to follow and assist with discharge planning.  Antony Blackbird, MSW, LCSW Clinical Social Worker    Expected Discharge Plan: Skilled Nursing Facility Barriers to Discharge: Continued Medical Work up  Expected Discharge Plan and Services   Discharge Planning Services: CM Consult Post Acute Care Choice: Skilled Nursing Facility Living arrangements for the past 2 months: Skilled Nursing Facility                                       Social Determinants of Health (SDOH) Interventions SDOH Screenings   Food Insecurity: No Food Insecurity (04/02/2023)  Housing: Low Risk  (04/02/2023)  Transportation Needs: No Transportation Needs (04/02/2023)  Utilities: Not At Risk (04/02/2023)  Tobacco Use: Low Risk  (04/03/2023)    Readmission Risk Interventions    10/24/2022   11:21 AM 09/02/2022   10:36 AM  Readmission Risk Prevention Plan  Transportation Screening Complete Complete  Medication Review Oceanographer) Complete Complete  PCP or Specialist appointment within 3-5 days of discharge Complete Complete  HRI or Home Care Consult Complete Complete  SW Recovery Care/Counseling Consult Complete Complete  Palliative Care Screening Not Applicable Not Applicable  Skilled Nursing Facility Complete Complete

## 2023-04-08 DIAGNOSIS — Z515 Encounter for palliative care: Secondary | ICD-10-CM

## 2023-04-08 LAB — BASIC METABOLIC PANEL
Anion gap: 8 (ref 5–15)
BUN: 52 mg/dL — ABNORMAL HIGH (ref 8–23)
CO2: 22 mmol/L (ref 22–32)
Calcium: 8.2 mg/dL — ABNORMAL LOW (ref 8.9–10.3)
Chloride: 100 mmol/L (ref 98–111)
Creatinine, Ser: 0.84 mg/dL (ref 0.61–1.24)
GFR, Estimated: >60 mL/min (ref >60)
Glucose, Bld: 150 mg/dL — ABNORMAL HIGH (ref 70–99)
Potassium: 4.2 mmol/L (ref 3.5–5.1)
Sodium: 130 mmol/L — ABNORMAL LOW (ref 135–145)

## 2023-04-08 LAB — CBC
HCT: 32.7 % — ABNORMAL LOW (ref 39.0–52.0)
Hemoglobin: 10.1 g/dL — ABNORMAL LOW (ref 13.0–17.0)
MCH: 24.3 pg — ABNORMAL LOW (ref 26.0–34.0)
MCHC: 30.9 g/dL (ref 30.0–36.0)
MCV: 78.6 fL — ABNORMAL LOW (ref 80.0–100.0)
Platelets: 301 10*3/uL (ref 150–400)
RBC: 4.16 MIL/uL — ABNORMAL LOW (ref 4.22–5.81)
RDW: 18.9 % — ABNORMAL HIGH (ref 11.5–15.5)
WBC: 11.6 10*3/uL — ABNORMAL HIGH (ref 4.0–10.5)
nRBC: 0 % (ref 0.0–0.2)

## 2023-04-08 LAB — GLUCOSE, CAPILLARY
Glucose-Capillary: 145 mg/dL — ABNORMAL HIGH (ref 70–99)
Glucose-Capillary: 188 mg/dL — ABNORMAL HIGH (ref 70–99)
Glucose-Capillary: 152 mg/dL — ABNORMAL HIGH (ref 70–99)
Glucose-Capillary: 159 mg/dL — ABNORMAL HIGH (ref 70–99)

## 2023-04-08 MED ORDER — ALBUMIN HUMAN 25 % IV SOLN
12.5000 g | Freq: Once | INTRAVENOUS | Status: AC
  Administered 2023-04-08: 12.5 g via INTRAVENOUS

## 2023-04-08 MED ORDER — FUROSEMIDE 10 MG/ML IJ SOLN
40.0000 mg | Freq: Once | INTRAMUSCULAR | Status: AC
  Administered 2023-04-08: 40 mg via INTRAVENOUS
  Filled 2023-04-08: qty 4

## 2023-04-08 MED ORDER — FUROSEMIDE 10 MG/ML IJ SOLN: 40.0000 mg | Freq: Every day | INTRAMUSCULAR | Status: DC

## 2023-04-08 MED ORDER — SODIUM CHLORIDE 0.9 % IV SOLN: INTRAVENOUS | Status: DC | PRN

## 2023-04-08 MED ORDER — FUROSEMIDE 10 MG/ML IJ SOLN: 40.0000 mg | Freq: Two times a day (BID) | INTRAMUSCULAR | Status: DC

## 2023-04-08 MED ORDER — ORAL CARE MOUTH RINSE: 15.0000 mL | OROMUCOSAL | Status: DC | PRN

## 2023-04-08 MED ORDER — FUROSEMIDE 10 MG/ML IJ SOLN
40.0000 mg | Freq: Two times a day (BID) | INTRAMUSCULAR | Status: DC
  Administered 2023-04-09 – 2023-04-13 (×8): 40 mg via INTRAVENOUS
  Filled 2023-04-08 (×9): qty 4

## 2023-04-08 MED ORDER — FUROSEMIDE 10 MG/ML IJ SOLN: 40.0000 mg | Freq: Once | INTRAMUSCULAR | Status: DC

## 2023-04-08 NOTE — Progress Notes (Addendum)
6 Days Post-Op Subjective: Peter Yang. Packing changed on rounds.  Patient is extremely lethargic and refuses to attempt to participate in care. He remains resistant to dressing changes.   Objective: Vital signs in last 24 hours: Temp:  [97.4 F (36.3 C)-98.3 F (36.8 C)] 98.3 F (36.8 C) (06/18 1200) Pulse Rate:  [72-92] 83 (06/18 1100) Resp:  [0-21] 18 (06/18 1200) BP: (96-119)/(59-91) 105/73 (06/18 1200) SpO2:  [94 %-97 %] 95 % (06/18 1000) Weight:  [409.8 kg] 116.8 kg (06/18 0712)  Intake/Output from previous day: 06/17 0701 - 06/18 0700 In: 1560 [P.O.:360; IV Piggyback:1200] Out: 2100 [Urine:2100]  Intake/Output this shift: Total I/O In: 540 [P.O.:240; IV Piggyback:300] Out: 1000 [Urine:1000]  Physical Exam:  General: Alert and oriented CV: No cyanosis Lungs: equal chest rise Abdomen: Soft, NTND, no rebound or guarding Gu: Foley catheter in place draining yellow urine; wound packing in place.  Catheter under tension, diffuse anasarca throughout thighs and groin. Urethral erosion. Foley strap removed.  No areas of necrosis noted. Beefy pink tissue throughout. No purulent drainage.   Lab Results: Recent Labs    04/06/23 0122 04/07/23 0135 04/08/23 0743  HGB 9.7* 9.3* 10.1*  HCT 31.5* 30.4* 32.7*   BMET Recent Labs    04/07/23 0135 04/08/23 0743  NA 129* 130*  K 3.9 4.2  CL 97* 100  CO2 22 22  GLUCOSE 248* 150*  BUN 60* 52*  CREATININE 1.01 0.84  CALCIUM 8.3* 8.2*     Studies/Results: ECHOCARDIOGRAM COMPLETE  Result Date: 04/07/2023    ECHOCARDIOGRAM REPORT   Patient Name:   SERJIO STROHMAIER Date of Exam: 04/07/2023 Medical Rec #:  [id number]      Height:       76.0 in Accession #:    [id number]     Weight:       258.2 lb Date of Birth:  [birth year]       BSA:          2.469 m Patient Age:    67 years       BP:           116/72 mmHg Patient Gender: M              HR:           77 bpm. Exam Location:  Inpatient Procedure: 2D Echo, 3D Echo, Cardiac Doppler and Color  Doppler Indications:    I50.40* Unspecified combined systolic (congestive) and diastolic                 (congestive) heart failure  History:        Patient has prior history of Echocardiogram examinations, most                 recent 09/03/2022. Cardiomyopathy, CAD, Abnormal ECG, Mitral                 Valve Disease, Arrythmias:Atrial Fibrillation; Risk                 Factors:Diabetes and Dyslipidemia.  Sonographer:    Sheralyn Boatman RDCS Referring Phys: [phone] LINDSAY B ROBERTS IMPRESSIONS  1. Left ventricular ejection fraction, by estimation, is 35%. The left ventricle has moderately decreased function. The left ventricle demonstrates regional wall motion abnormalities (see scoring diagram/findings for description). Left ventricular diastolic parameters are consistent with Grade II diastolic dysfunction (pseudonormalization).  2. Right ventricular systolic function is mildly reduced. The right ventricular size is moderately enlarged. There is normal pulmonary artery systolic pressure.  3. The mitral valve is normal in structure. Severe mitral valve regurgitation secondary to restriction of the posterior leaflet.  4. The aortic valve is normal in structure. There is mild thickening of the aortic valve. Aortic valve regurgitation is trivial.  5. There is mild dilatation of the ascending aorta, measuring 41 mm.  6. Left atrial size was mildly dilated. FINDINGS  Left Ventricle: Left ventricular ejection fraction, by estimation, is 30 to 35%. The left ventricle has moderately decreased function. The left ventricle demonstrates regional wall motion abnormalities. The left ventricular internal cavity size was normal in size. There is no left ventricular hypertrophy. Left ventricular diastolic parameters are consistent with Grade II diastolic dysfunction (pseudonormalization).  LV Wall Scoring: The inferior septum, posterior wall, and apex are hypokinetic. Right Ventricle: The right ventricular size is moderately enlarged. No  increase in right ventricular wall thickness. Right ventricular systolic function is mildly reduced. There is normal pulmonary artery systolic pressure. The tricuspid regurgitant velocity is 2.50 m/s, and with an assumed right atrial pressure of 8 mmHg, the estimated right ventricular systolic pressure is 33.0 mmHg. Left Atrium: Left atrial size was mildly dilated. Right Atrium: Right atrial size was normal in size. Pericardium: There is no evidence of pericardial effusion. Mitral Valve: The mitral valve is normal in structure. Severe mitral valve regurgitation. MV peak gradient, 3.3 mmHg. The mean mitral valve gradient is 1.0 mmHg. Tricuspid Valve: The tricuspid valve is normal in structure. Tricuspid valve regurgitation is mild. Aortic Valve: The aortic valve is normal in structure. There is mild thickening of the aortic valve. Aortic valve regurgitation is trivial. Pulmonic Valve: The pulmonic valve was normal in structure. Pulmonic valve regurgitation is not visualized. Aorta: The aortic root is normal in size and structure. There is mild dilatation of the ascending aorta, measuring 41 mm. IAS/Shunts: The interatrial septum was not assessed. Additional Comments: There is pleural effusion in the left lateral region.  LEFT VENTRICLE PLAX 2D LVIDd:         5.70 cm      Diastology LVIDs:         5.20 cm      LV e' medial:    5.55 cm/s LV PW:         1.20 cm      LV E/e' medial:  17.5 LV IVS:        1.30 cm      LV e' lateral:   3.81 cm/s LVOT diam:     2.70 cm      LV E/e' lateral: 25.5 LVOT Area:     5.73 cm  LV Volumes (MOD) LV vol d, MOD A2C: 206.5 ml 3D Volume EF: LV vol d, MOD A4C: 193.0 ml 3D EF:        24 % LV vol s, MOD A2C: 140.5 ml LV EDV:       231 ml LV vol s, MOD A4C: 114.0 ml LV ESV:       175 ml LV SV MOD A2C:     66.0 ml  LV SV:        56 ml LV SV MOD A4C:     193.0 ml LV SV MOD BP:      83.5 ml RIGHT VENTRICLE            IVC RV S prime:     4.20 cm/s  IVC diam: 1.90 cm TAPSE (M-mode): 0.7 cm LEFT  ATRIUM  Index        RIGHT ATRIUM           Index LA diam:      4.80 cm 1.94 cm/m   RA Area:     13.60 cm LA Vol (A2C): 55.9 ml 22.64 ml/m  RA Volume:   31.80 ml  12.88 ml/m LA Vol (A4C): 54.3 ml 21.99 ml/m                        PULMONIC VALVE AORTA                 PR End Diast Vel: 2.51 msec Ao Root diam: 4.60 cm Ao Asc diam:  4.00 cm MITRAL VALVE                  TRICUSPID VALVE MV Area (PHT): 4.39 cm       TR Peak grad:   25.0 mmHg MV Peak grad:  3.3 mmHg       TR Vmax:        250.00 cm/s MV Mean grad:  1.0 mmHg MV Vmax:       0.91 m/s       SHUNTS MV Vmean:      53.8 cm/s      Systemic Diam: 2.70 cm MV Decel Time: 173 msec MR Peak grad:    58.7 mmHg MR Mean grad:    38.0 mmHg MR Vmax:         383.00 cm/s MR Vmean:        289.0 cm/s MR PISA:         5.67 cm MR PISA Eff ROA: 57 mm MR PISA Radius:  0.95 cm MV E velocity: 97.10 cm/s Aditya Sabharwal Electronically signed by Dorthula Nettles Signature Date/Time: 04/07/2023/4:53:51 PM    Final     Assessment/Plan: No acute indication for OR  Patient is showing improvement from addition of albumin and Lasix.  Pelvic anasarca has diminished quite a bit overnight.  Continue daily wound changes. Plan for OR for closure on Wed, 6/19  And wound care with nursing today.  Around 9 cm maximal depth in pelvis and towards right thigh fold.  Change for the last few days.  Patient asking for as needed pain medication in IV form at all times.  Discussed the need to stay on oral options and reserve IV medications for n.p.o. or breakthrough.  Unable to premedicate the patient because he is not due for repeat dosing yet.  This makes his dressing changes much worse on him and difficult for [place].  He is already showing signs of respiratory depression on his present doses and we do not feel comfortable upping them at this time  Broad ABX per primary- Merrem/Clinda  Will see as needed for wound care/assessment. Please call with questions      LOS: 6 days    04/08/2023, 1:20 PM

## 2023-04-08 NOTE — Progress Notes (Signed)
Heart Failure Navigator Progress Note  Assessed for Heart & Vascular TOC clinic readiness.  Patient does not meet criteria due to EF 35%, Palliative consult, per note poor prognosis , not a candidate for advanced therapies. .   Navigator will sign off at this time.    Rhae Hammock, BSN, Scientist, clinical (histocompatibility and immunogenetics) Only

## 2023-04-08 NOTE — Plan of Care (Signed)
  Problem: Education: Goal: Knowledge of General Education information will improve Description Including pain rating scale, medication(s)/side effects and non-pharmacologic comfort measures Outcome: Progressing   

## 2023-04-08 NOTE — Progress Notes (Signed)
Physical Therapy Evaluation Patient Details Name: Peter Yang MRN: [id number] DOB: 1955/10/30 Today's Date: 04/08/2023  History of Present Illness  67 yo male with onset of foley cath collection of pus and blood was admitted on 6/11, received foley cath and then pain worsened.  Pt is admitted for UTI with ESBL e coli.  PMHx:  CHF, L BKA, CAD, HLD, DM, BPM,  NSTEMI  Clinical Impression  Pt was assessed and note bed level tx was done as he is lethargic and unable to give much effort.  He is a previous SNF resident, initially stating he was doing a lot of walking at SNF and when asked about the LLE contracture, agreed that he was lifted with hoyer to chair.  Will recommend mobility up to 2x per week for recovery of ROM and to increase his ability to benefit from rehab upon return, but do not expect to get him walking in this setting.  Pt has no prosthesis and would not be able to stand with it functionally due to significant contracture of L knee.  Follow acutely for up to standing transfers as pt can tolerate.       Recommendations for follow up therapy are one component of a multi-disciplinary discharge planning process, led by the attending physician.  Recommendations may be updated based on patient status, additional functional criteria and insurance authorization.  Follow Up Recommendations Can patient physically be transported by private vehicle: No     Assistance Recommended at Discharge Frequent or constant Supervision/Assistance  Patient can return home with the following  Two people to help with walking and/or transfers;Two people to help with bathing/dressing/bathroom;Assistance with feeding;Direct supervision/assist for medications management;Direct supervision/assist for financial management;Assist for transportation;Help with stairs or ramp for entrance    Equipment Recommendations None recommended by PT  Recommendations for Other Services       Functional Status Assessment  Patient has had a recent decline in their functional status and demonstrates the ability to make significant improvements in function in a reasonable and predictable amount of time.     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 Restrictions Weight Bearing Restrictions: No RLE Weight Bearing: Weight bearing as tolerated      Mobility  Bed Mobility Overal bed mobility: Needs Assistance Bed Mobility: Rolling Rolling: Total assist         General bed mobility comments: refused to let PT sit him up    Transfers                   General transfer comment: refused to attempt    Ambulation/Gait                  Stairs            Wheelchair Mobility    Modified Rankin (Stroke Patients Only)       Balance                                             Pertinent Vitals/Pain Pain Assessment Pain Assessment: Faces Faces Pain Scale: No hurt Pain Intervention(s):  (has lethargic presentation but no outright pain issues)    Home Living Family/patient expects to be discharged to:: Skilled nursing facility                   Additional Comments: reports  he was just walking in SNF on RW with prosthesis but does not have enough ext to stand and walk on LLE    Prior Function Prior Level of Function : Needs assist       Physical Assist : Mobility (physical) Mobility (physical): Gait   Mobility Comments: reports walking but is too contracted on R knee for prosthesis       Hand Dominance   Dominant Hand: Right    Extremity/Trunk Assessment   Upper Extremity Assessment Upper Extremity Assessment: Generalized weakness    Lower Extremity Assessment Lower Extremity Assessment: Generalized weakness;LLE deficits/detail LLE Deficits / Details: knee flexion contracture LLE Coordination: decreased gross motor    Cervical / Trunk Assessment Cervical / Trunk  Assessment: Kyphotic (in bed)  Communication   Communication: No difficulties;Other (comment) (confusion and misinformation)  Cognition Arousal/Alertness: Lethargic Behavior During Therapy: Flat affect Overall Cognitive Status: History of cognitive impairments - at baseline                                 General Comments: canot follow conversation and directions and cannot keep eyes open        General Comments General comments (skin integrity, edema, etc.): pt would not agree to get OOB but does demonstrate good tolerance for being shifted to move    Exercises     Assessment/Plan    PT Assessment Patient needs continued PT services  PT Problem List Decreased strength;Decreased activity tolerance;Decreased mobility;Decreased cognition;Decreased skin integrity       PT Treatment Interventions Functional mobility training;Therapeutic activities;Therapeutic exercise;Balance training;Neuromuscular re-education;Patient/family education    PT Goals (Current goals can be found in the Care Plan section)  Acute Rehab PT Goals Patient Stated Goal: none stated PT Goal Formulation: Patient unable to participate in goal setting Time For Goal Achievement: 04/22/23 Potential to Achieve Goals: Fair    Frequency Min 1X/week     Co-evaluation               AM-PAC PT "6 Clicks" Mobility  Outcome Measure Help needed turning from your back to your side while in a flat bed without using bedrails?: Total Help needed moving from lying on your back to sitting on the side of a flat bed without using bedrails?: Total Help needed moving to and from a bed to a chair (including a wheelchair)?: Total Help needed standing up from a chair using your arms (e.g., wheelchair or bedside chair)?: Total Help needed to walk in hospital room?: Total Help needed climbing 3-5 steps with a railing? : Total 6 Click Score: 6    End of Session Equipment Utilized During Treatment:  Oxygen Activity Tolerance: Patient limited by fatigue;Treatment limited secondary to medical complications (Comment) Patient left: in bed;with call bell/phone within reach;with bed alarm set Nurse Communication: Mobility status PT Visit Diagnosis: Muscle weakness (generalized) (M62.81);Difficulty in walking, not elsewhere classified (R26.2)    Time: 1610-9604 PT Time Calculation (min) (ACUTE ONLY): 25 min   Charges:   PT Evaluation $PT Eval Moderate Complexity: 1 Mod PT Treatments $Therapeutic Activity: 8-22 mins       Ivar Drape 04/08/2023, 3:48 PM  Samul Dada, PT PhD Acute Rehab Dept. Number: [facility] [id number] and [facility] [phone]

## 2023-04-08 NOTE — Progress Notes (Signed)
Patient ID: Juvenal Renney, male   DOB: January 16, 1956, 67 y.o.   MRN: [id number]    Progress Note from the Palliative Medicine Team at [facility]   Patient Name: Bol Masiello        Date: 04/08/2023 DOB: December 07, 1955  Age: 67 y.o. MRN#: [id number] Attending Physician: Zigmund Daniel., * Primary Care Physician: Annita Brod, MD Admit Date: 04/01/2023   Extensive chart review has been completed prior to meeting with patient/family  including labs, vital signs, imaging, progress/consult notes, orders, medications and available advance directive documents.    This NP assessed patient at the bedside as a follow up for palliative medicine needs and emotional support.  Levan is a little more interactive today with me.  He reports his pain as "maybe a little bit better"  He talked with me today about his work [employer] account [employer], and the detriment to his whole being when he was let go about 4 years ago        We discussed the importance of a holistic approach to pain and symptom management.  He did allow me to provide a neck and hand massage which he appreciated and said helped him relax.  Continued conversation and education with Mr. Olegario Shearer regarding his current medical situation.  Education offered on the importance of mobility and motivation and  his  participation in therapies.   Declined spiritual care   Education offered today regarding  the importance of continued conversation with family and their  medical providers regarding overall plan of care and treatment options,  ensuring decisions are within the context of the patients values and GOCs.  Questions and concerns addressed   Discussed with nursing   PMT will continue to support holistically    Time: 50 minutes  Detailed review of medical records ( labs, imaging, vital signs), medically appropriate exam ( MS, skin, resp)   discussed with treatment team, counseling and education to patient, family, staff, documenting clinical  information, medication management, coordination of care    Lorinda Creed NP  Palliative Medicine Team Team Phone # [phone] Pager [phone]

## 2023-04-08 NOTE — Progress Notes (Addendum)
PROGRESS NOTE    Peter Yang  MVH:[id number] DOB: 03-12-1956 DOA: 04/01/2023 PCP: Annita Brod, MD  Chief Complaint  Patient presents with   Urinary Retention    Brief Narrative:   67 year old male with Cristabel Bicknell history of left below-knee amputation, chronic systolic heart failure, history of Emmeline Winebarger-fib, recent admission in April 2024 at [facility] for right diabetic foot ulcer status post fifth ray amputation, hypertension, type 2 diabetes who presents to the ER today from [place] nursing [facility] due to pus and blood from his Foley catheter.  Patient had acute urinary retention on Saturday, June 8.  He had Aasiyah Auerbach Foley catheter placed at that time.    CT scan showed findings c/w fournier's gangrene.  Urology was consulted.  He's post op from I&D on 6/12.  Plan to go back to OR 6/19 for wound closure.  Cardiology has been consulted with concern for heart failure/volume overload, but they noted his poor cardiovascular prognosis and did not suspect he was in decompensated heart failure.    At this time, on antibiotics per ID (plan for 4 weeks after last surgical procedure).   He's got significant edema and I'm trialing lasix with parameters given the significant edema around his wound.   See below for additional details.  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 Note poor cardiovascular prognosis.  See 6/16 note.  Palliative care consulted.   Lasix was on hold with prior cards recs and hypotension.   Given the dependent edema noted, the anasarca throughout the thighs and groin, weeping - I think would be beneficial to try to diuresis some of this fluid cautiously if tolerated by his BP.  I think would be beneficial from Pierra Skora wound healing standpoint if we can get some fluid off (discussed with urology NP who suspects his edema to his groin and thighs is worse than last week).  Will try lasix with holding parameters for SBP less than 90.  Echo with EF 35%, grade II diastolic dysfunction, mildly reduce RVSF, RV size moderately enlarged, severe MV regurg  Hypotension Relatively chronic it sounds On stress dose steroids, will start to wean these  Improved on midodrine (cautious diuresis with holding parameters)  Hypervolemic Hyponatremia Remains grossly overloaded on exam Improved today, trend fluid restriction Continue albumin - will try lasix cautiously with his significant dependent edema Urine sodium, urine osms, serum osms  Fournier's Gangrene  Continue abx, appreciate  pharmacy assistance, will c/s ID S/p I&D of infection scrotal with some tissue debridement Pain management, bowel regimen  Hx ESBL E. Coli 07/2022 UTI ruled out, urine culture no growth MRI with rim enhancing fluid and air collection within the soft tissues of the anterior  pelvis at the base of the penis (compatible with abscess), moderate degereenative changes of the pubic symphysis with trace effusion, septic arthritis of pubic symphysis not excluded.  No evidence of osteo.  Diffuse myositis throughout pelvic and thigh musculature.  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   Gross hematuria Due to foley? This also seems to be known, history of this Eliquis currently on hold, will resume post op when ok with urology - has pending procedure 6/19, will follow  Urine culture with no growth     Type 2 diabetes mellitus with diabetic peripheral angiopathy without gangrene, without long-term current use of insulin (HCC) Continue with SSI and lantus.   History of urinary retention Foley catheter placed on Saturday 03-29-2023. Start flomax   DNR (do not resuscitate) Verified with pt that he is Peter Yang DNR/DNI.   PAF (paroxysmal atrial fibrillation) (HCC) Stable.   Essential hypertension Stable. Has hx of orthostatic hypotension.   Long term current use of systemic steroids Pt remains on cortef bid. Unclear why he is still on cortef.  Appears to have started in October/November 2023. I think these were started due to his chronic hypotension.  Would be worth an endocrine referral if he's able to make it vs Diarra Kos slow gradual wean of his steroids he's been on since last year.  Prostate Cancer Follow outpatient with urology     DVT prophylaxis:  SCD Code Status: DNR Family Communication: none Disposition:   Status is: Inpatient Remains inpatient appropriate because: need for continued care   Consultants:  Urology PCCM  Procedures:  Incision and drainage of infection scrotal with some tissue debridement   Antimicrobials:  Anti-infectives (From admission, onward)    Start     Dose/Rate Route Frequency Ordered Stop   04/04/23 1000  linezolid (ZYVOX) IVPB 600 mg        600 mg 300 mL/hr over 60 Minutes Intravenous Every 12 hours 04/04/23 0902     04/04/23 0200  vancomycin (VANCOCIN) IVPB 1000 mg/200 mL premix  Status:  Discontinued        1,000 mg 200 mL/hr over 60 Minutes Intravenous Every 12 hours 04/03/23 1936 04/04/23 0902   04/03/23 1400  vancomycin (VANCOREADY) IVPB 1500 mg/300 mL  Status:  Discontinued        1,500 mg 150 mL/hr over 120 Minutes Intravenous Every 12 hours 04/03/23 1302 04/03/23 1936   04/02/23 1400  vancomycin (VANCOCIN) IVPB 1000 mg/200 mL premix  Status:  Discontinued        1,000 mg 200 mL/hr over 60 Minutes Intravenous Every 12 hours 04/02/23 0036 04/03/23 1302   04/02/23 0115  vancomycin (VANCOREADY) IVPB 2000 mg/400 mL        2,000  mg 200 mL/hr over 120 Minutes Intravenous  Once 04/02/23 0015 04/02/23 0529   04/02/23 0100  clindamycin (CLEOCIN) IVPB 900 mg  Status:  Discontinued        900 mg 100 mL/hr over 30 Minutes Intravenous Every 8 hours 04/02/23 0002 04/02/23 0005   04/02/23 0100  clindamycin (CLEOCIN) IVPB 900 mg  Status:  Discontinued        900 mg 100 mL/hr over 30 Minutes Intravenous Every 8 hours 04/02/23 0012 04/04/23 0902   04/02/23 0000  meropenem (MERREM) 1 g in sodium chloride 0.9 % 100 mL IVPB        1 g 200 mL/hr over 30 Minutes Intravenous Every 8 hours 04/01/23 2352     04/01/23 2215  meropenem (MERREM) 2 g in sodium chloride 0.9 % 100 mL IVPB  Status:  Discontinued        2 g 280 mL/hr over 30 Minutes Intravenous  Once 04/01/23 2221 04/01/23 2352   04/01/23 1800   cefTRIAXone (ROCEPHIN) 1 g in sodium chloride 0.9 % 100 mL IVPB        1 g 200 mL/hr over 30 Minutes Intravenous  Once 04/01/23 1746 04/01/23 1902       Subjective: No new complaints, groin pain   Objective: Vitals:   04/07/23 2300 04/08/23 0300 04/08/23 0700 04/08/23 0715  BP: 96/67 105/70 (!) 98/59   Pulse: 73 78 81   Resp: 17 17 20 16   Temp: 98 F (36.7 C) 98.1 F (36.7 C) 97.6 F (36.4 C)   TempSrc: Oral Oral Oral   SpO2: 94% 94%    Weight:      Height:        Intake/Output Summary (Last 24 hours) at 04/08/2023 0835 Last data filed at 04/08/2023 0615 Gross per 24 hour  Intake 1440 ml  Output 2100 ml  Net -660 ml   Filed Weights   04/05/23 0313 04/06/23 0611 04/07/23 0708  Weight: 102.3 kg 115.9 kg 117.1 kg    Examination:  General: No acute distress. Cardiovascular: RRR Lungs: unlabred GU deferred Neurological: Alert and oriented 3. Moves all extremities 4 with equal strength. Cranial nerves II through XII grossly intact. Extremities: bilateral LE edema, anasarca - significant edema to hips/thighs bilaterally   Data Reviewed: I have personally reviewed following labs and imaging studies  CBC: Recent Labs  Lab 04/03/23 0434 04/03/23 1830 04/04/23 0242 04/05/23 0106 04/06/23 0122 04/07/23 0135 04/08/23 0743  WBC 14.4*   < > 15.5* 13.1* 12.8* 11.1* 11.6*  NEUTROABS 13.1*  --  14.2* 11.5* 9.2* 8.8*  --   HGB 9.0*   < > 8.6* 8.8* 9.7* 9.3* 10.1*  HCT 29.7*   < > 29.0* 28.7* 31.5* 30.4* 32.7*  MCV 79.8*   < > 79.0* 78.2* 78.9* 77.4* 78.6*  PLT 276   < > 268 300 357 321 301   < > = values in this interval not displayed.    Basic Metabolic Panel: Recent Labs  Lab 04/03/23 0434 04/03/23 1810 04/04/23 0242 04/05/23 0106 04/06/23 0122 04/07/23 0135 04/08/23 0743  NA 130*   < > 127* 127* 126* 129* 130*  K 3.8   < > 4.0 3.8 4.2 3.9 4.2  CL 100   < > 98 97* 96* 97* 100  CO2 20*   < > 20* 19* 21* 22 22  GLUCOSE 170*   < > 258* 292* 188* 248*  150*  BUN 32*   < > 35* 50* 61* 60*  52*  CREATININE 0.88   < > 1.25* 1.03 1.09 1.01 0.84  CALCIUM 7.6*   < > 7.8* 8.2* 8.3* 8.3* 8.2*  MG 1.5*  --  2.3 2.0 2.1 2.1  --   PHOS 3.7  --  3.6 3.2 3.1 2.7  --    < > = values in this interval not displayed.    GFR: Estimated Creatinine Clearance: 121 mL/min (by C-G formula based on SCr of 0.84 mg/dL).  Liver Function Tests: Recent Labs  Lab 04/03/23 0434 04/04/23 0242 04/05/23 0106 04/06/23 0122 04/07/23 0135  AST 22 21 17 20 16   ALT 17 16 16 16 16   ALKPHOS 69 70 65 60 57  BILITOT 0.8 0.7 0.7 0.7 0.7  PROT 5.1* 5.1* 5.3* 5.6* 5.3*  ALBUMIN 1.7* 1.6* 2.2* 2.5* 2.4*    CBG: Recent Labs  Lab 04/07/23 0557 04/07/23 1216 04/07/23 1710 04/07/23 2108 04/08/23 0555  GLUCAP 207* 200* 137* 190* 145*     Recent Results (from the past 240 hour(s))  Urine Culture     Status: None   Collection Time: 04/01/23  5:46 PM   Specimen: Urine, Catheterized  Result Value Ref Range Status   Specimen Description URINE, CATHETERIZED  Final   Special Requests NONE  Final   Culture   Final    NO GROWTH Performed at [facility], [address] [place], [place] [address]    Report Status 04/02/2023 FINAL  Final  MRSA Next Gen by PCR, Nasal     Status: Abnormal   Collection Time: 04/02/23  1:00 AM   Specimen: Nasal Mucosa; Nasal Swab  Result Value Ref Range Status   MRSA by PCR Next Gen DETECTED (Terena Bohan) NOT DETECTED Final    Comment: RESULT CALLED TO, READ BACK BY AND VERIFIED WITH: RN KAITLYN BLUCHER ON 04/02/23 @ 1515 BY DRT (NOTE) The GeneXpert MRSA Assay (FDA approved for NASAL specimens only), is one component of Damarien Nyman comprehensive MRSA colonization surveillance program. It is not intended to diagnose MRSA infection nor to guide or monitor treatment for MRSA infections. Test performance is not FDA approved in patients less than 81 years old. Performed at [facility], [address], [place], [place] [address]           Radiology Studies: ECHOCARDIOGRAM COMPLETE  Result Date: 04/07/2023    ECHOCARDIOGRAM REPORT   Patient Name:   LOCHLAN WALTERSCHEID Date of Exam: 04/07/2023 Medical Rec #:  [id number]      Height:       76.0 in Accession #:    [id number]     Weight:       258.2 lb Date of Birth:  [birth year]       BSA:          2.469 m Patient Age:    67 years       BP:           116/72 mmHg Patient Gender: M              HR:           77 bpm. Exam Location:  Inpatient Procedure: 2D Echo, 3D Echo, Cardiac Doppler and Color Doppler Indications:    I50.40* Unspecified combined systolic (congestive) and diastolic                 (congestive) heart failure  History:        Patient has prior history of Echocardiogram examinations, most  recent 09/03/2022. Cardiomyopathy, CAD, Abnormal ECG, Mitral                 Valve Disease, Arrythmias:Atrial Fibrillation; Risk                 Factors:Diabetes and Dyslipidemia.  Sonographer:    Sheralyn Boatman RDCS Referring Phys: [phone] LINDSAY B ROBERTS IMPRESSIONS  1. Left ventricular ejection fraction, by estimation, is 35%. The left ventricle has moderately decreased function. The left ventricle demonstrates regional wall motion abnormalities (see scoring diagram/findings for description). Left ventricular diastolic parameters are consistent with Grade II diastolic dysfunction (pseudonormalization).  2. Right ventricular systolic function is mildly reduced. The right ventricular size is moderately enlarged. There is normal pulmonary artery systolic pressure.  3. The mitral valve is normal in structure. Severe mitral valve regurgitation secondary to restriction of the posterior leaflet.  4. The aortic valve is normal in structure. There is mild thickening of the aortic valve. Aortic valve regurgitation is trivial.  5. There is mild dilatation of the ascending aorta, measuring 41 mm.  6. Left atrial size was mildly dilated. FINDINGS  Left Ventricle: Left ventricular ejection fraction,  by estimation, is 30 to 35%. The left ventricle has moderately decreased function. The left ventricle demonstrates regional wall motion abnormalities. The left ventricular internal cavity size was normal in size. There is no left ventricular hypertrophy. Left ventricular diastolic parameters are consistent with Grade II diastolic dysfunction (pseudonormalization).  LV Wall Scoring: The inferior septum, posterior wall, and apex are hypokinetic. Right Ventricle: The right ventricular size is moderately enlarged. No increase in right ventricular wall thickness. Right ventricular systolic function is mildly reduced. There is normal pulmonary artery systolic pressure. The tricuspid regurgitant velocity is 2.50 m/s, and with an assumed right atrial pressure of 8 mmHg, the estimated right ventricular systolic pressure is 33.0 mmHg. Left Atrium: Left atrial size was mildly dilated. Right Atrium: Right atrial size was normal in size. Pericardium: There is no evidence of pericardial effusion. Mitral Valve: The mitral valve is normal in structure. Severe mitral valve regurgitation. MV peak gradient, 3.3 mmHg. The mean mitral valve gradient is 1.0 mmHg. Tricuspid Valve: The tricuspid valve is normal in structure. Tricuspid valve regurgitation is mild. Aortic Valve: The aortic valve is normal in structure. There is mild thickening of the aortic valve. Aortic valve regurgitation is trivial. Pulmonic Valve: The pulmonic valve was normal in structure. Pulmonic valve regurgitation is not visualized. Aorta: The aortic root is normal in size and structure. There is mild dilatation of the ascending aorta, measuring 41 mm. IAS/Shunts: The interatrial septum was not assessed. Additional Comments: There is pleural effusion in the left lateral region.  LEFT VENTRICLE PLAX 2D LVIDd:         5.70 cm      Diastology LVIDs:         5.20 cm      LV e' medial:    5.55 cm/s LV PW:         1.20 cm      LV E/e' medial:  17.5 LV IVS:        1.30 cm       LV e' lateral:   3.81 cm/s LVOT diam:     2.70 cm      LV E/e' lateral: 25.5 LVOT Area:     5.73 cm  LV Volumes (MOD) LV vol d, MOD A2C: 206.5 ml 3D Volume EF: LV vol d, MOD A4C: 193.0 ml 3D EF:  24 % LV vol s, MOD A2C: 140.5 ml LV EDV:       231 ml LV vol s, MOD A4C: 114.0 ml LV ESV:       175 ml LV SV MOD A2C:     66.0 ml  LV SV:        56 ml LV SV MOD A4C:     193.0 ml LV SV MOD BP:      83.5 ml RIGHT VENTRICLE            IVC RV S prime:     4.20 cm/s  IVC diam: 1.90 cm TAPSE (M-mode): 0.7 cm LEFT ATRIUM           Index        RIGHT ATRIUM           Index LA diam:      4.80 cm 1.94 cm/m   RA Area:     13.60 cm LA Vol (A2C): 55.9 ml 22.64 ml/m  RA Volume:   31.80 ml  12.88 ml/m LA Vol (A4C): 54.3 ml 21.99 ml/m                        PULMONIC VALVE AORTA                 PR End Diast Vel: 2.51 msec Ao Root diam: 4.60 cm Ao Asc diam:  4.00 cm MITRAL VALVE                  TRICUSPID VALVE MV Area (PHT): 4.39 cm       TR Peak grad:   25.0 mmHg MV Peak grad:  3.3 mmHg       TR Vmax:        250.00 cm/s MV Mean grad:  1.0 mmHg MV Vmax:       0.91 m/s       SHUNTS MV Vmean:      53.8 cm/s      Systemic Diam: 2.70 cm MV Decel Time: 173 msec MR Peak grad:    58.7 mmHg MR Mean grad:    38.0 mmHg MR Vmax:         383.00 cm/s MR Vmean:        289.0 cm/s MR PISA:         5.67 cm MR PISA Eff ROA: 57 mm MR PISA Radius:  0.95 cm MV E velocity: 97.10 cm/s Aditya Sabharwal Electronically signed by Dorthula Nettles Signature Date/Time: 04/07/2023/4:53:51 PM    Final         Scheduled Meds:  atorvastatin  80 mg Oral Daily   Chlorhexidine Gluconate Cloth  6 each Topical Daily   furosemide  40 mg Intravenous Once   furosemide  40 mg Intravenous Once   [START ON 04/09/2023] furosemide  40 mg Intravenous Daily   gabapentin  300 mg Oral QHS   hydrocortisone  10 mg Oral Daily   And   hydrocortisone  5 mg Oral QHS    HYDROmorphone (DILAUDID) injection  1 mg Intravenous Once   insulin aspart  0-15 Units  Subcutaneous TID WC   insulin aspart  0-5 Units Subcutaneous QHS   insulin glargine-yfgn  20 Units Subcutaneous QHS   midodrine  5 mg Oral TID with meals   mupirocin ointment  1 Application Nasal BID   nutrition supplement (JUVEN)  1 packet Oral BID BM   pantoprazole  40 mg Oral Daily  polyethylene glycol  17 g Oral BID   Ensure Max Protein  11 oz Oral BID   Continuous Infusions:  linezolid (ZYVOX) IV Stopped (04/07/23 2235)   meropenem (MERREM) IV 1 g (04/08/23 0421)     LOS: 6 days    Time spent: over 30 min    Lacretia Nicks, MD [facility]   To contact the attending provider between 7A-7P or the covering provider during after hours 7P-7A, please log into the web site [email or web address] and access using universal Wheeler password for that web site. If you do not have the password, please call the hospital operator.  04/08/2023, 8:35 AM

## 2023-04-09 ENCOUNTER — Encounter (HOSPITAL_COMMUNITY)
Admission: EM | Disposition: A | Discharge status: Skilled Nursing Facility | Payer: Self-pay | Source: Home / Self Care | Attending: Family Medicine

## 2023-04-09 ENCOUNTER — Other Ambulatory Visit: Payer: Self-pay

## 2023-04-09 ENCOUNTER — Inpatient Hospital Stay (HOSPITAL_COMMUNITY): Payer: Medicare Other | Admitting: Certified Registered Nurse Anesthetist

## 2023-04-09 ENCOUNTER — Encounter (HOSPITAL_COMMUNITY): Payer: Self-pay | Admitting: Family Medicine

## 2023-04-09 DIAGNOSIS — Z955 Presence of coronary angioplasty implant and graft: Secondary | ICD-10-CM

## 2023-04-09 DIAGNOSIS — I10 Essential (primary) hypertension: Secondary | ICD-10-CM

## 2023-04-09 DIAGNOSIS — N493 Fournier gangrene: Secondary | ICD-10-CM

## 2023-04-09 DIAGNOSIS — E1151 Type 2 diabetes mellitus with diabetic peripheral angiopathy without gangrene: Secondary | ICD-10-CM

## 2023-04-09 DIAGNOSIS — I2510 Atherosclerotic heart disease of native coronary artery without angina pectoris: Secondary | ICD-10-CM

## 2023-04-09 HISTORY — PX: IRRIGATION AND DEBRIDEMENT ABSCESS: SHX5252

## 2023-04-09 LAB — COMPREHENSIVE METABOLIC PANEL
ALT: 15 U/L (ref 0–44)
AST: 23 U/L (ref 15–41)
Albumin: 2.2 g/dL — ABNORMAL LOW (ref 3.5–5.0)
Alkaline Phosphatase: 60 U/L (ref 38–126)
Anion gap: 10 (ref 5–15)
BUN: 48 mg/dL — ABNORMAL HIGH (ref 8–23)
CO2: 22 mmol/L (ref 22–32)
Calcium: 8.1 mg/dL — ABNORMAL LOW (ref 8.9–10.3)
Chloride: 98 mmol/L (ref 98–111)
Creatinine, Ser: 0.81 mg/dL (ref 0.61–1.24)
GFR, Estimated: >60 mL/min (ref >60)
Glucose, Bld: 188 mg/dL — ABNORMAL HIGH (ref 70–99)
Potassium: 3.9 mmol/L (ref 3.5–5.1)
Sodium: 130 mmol/L — ABNORMAL LOW (ref 135–145)
Total Bilirubin: 1 mg/dL (ref 0.3–1.2)
Total Protein: 5.1 g/dL — ABNORMAL LOW (ref 6.5–8.1)

## 2023-04-09 LAB — GLUCOSE, CAPILLARY
Glucose-Capillary: 142 mg/dL — ABNORMAL HIGH (ref 70–99)
Glucose-Capillary: 123 mg/dL — ABNORMAL HIGH (ref 70–99)
Glucose-Capillary: 107 mg/dL — ABNORMAL HIGH (ref 70–99)
Glucose-Capillary: 93 mg/dL (ref 70–99)
Glucose-Capillary: 147 mg/dL — ABNORMAL HIGH (ref 70–99)

## 2023-04-09 LAB — CBC WITH DIFFERENTIAL/PLATELET
Abs Immature Granulocytes: 0.07 10*3/uL (ref 0.00–0.07)
Basophils Absolute: 0 10*3/uL (ref 0.0–0.1)
Basophils Relative: 0 %
Eosinophils Absolute: 0.7 10*3/uL — ABNORMAL HIGH (ref 0.0–0.5)
Eosinophils Relative: 4 %
HCT: 33.4 % — ABNORMAL LOW (ref 39.0–52.0)
Hemoglobin: 10.1 g/dL — ABNORMAL LOW (ref 13.0–17.0)
Immature Granulocytes: 1 %
Lymphocytes Relative: 9 %
Lymphs Abs: 1.3 10*3/uL (ref 0.7–4.0)
MCH: 23.2 pg — ABNORMAL LOW (ref 26.0–34.0)
MCHC: 30.2 g/dL (ref 30.0–36.0)
MCV: 76.8 fL — ABNORMAL LOW (ref 80.0–100.0)
Monocytes Absolute: 1 10*3/uL (ref 0.1–1.0)
Monocytes Relative: 7 %
Neutro Abs: 12.2 10*3/uL — ABNORMAL HIGH (ref 1.7–7.7)
Neutrophils Relative %: 79 %
Platelets: 323 10*3/uL (ref 150–400)
RBC: 4.35 MIL/uL (ref 4.22–5.81)
RDW: 18.9 % — ABNORMAL HIGH (ref 11.5–15.5)
WBC: 15.3 10*3/uL — ABNORMAL HIGH (ref 4.0–10.5)
nRBC: 0 % (ref 0.0–0.2)

## 2023-04-09 LAB — MAGNESIUM: Magnesium: 1.8 mg/dL (ref 1.7–2.4)

## 2023-04-09 LAB — PHOSPHORUS: Phosphorus: 2.6 mg/dL (ref 2.5–4.6)

## 2023-04-09 SURGERY — IRRIGATION AND DEBRIDEMENT ABSCESS
Anesthesia: General

## 2023-04-09 MED ORDER — FENTANYL CITRATE (PF) 250 MCG/5ML IJ SOLN
INTRAMUSCULAR | Status: AC
  Filled 2023-04-09: qty 5

## 2023-04-09 MED ORDER — CHLORHEXIDINE GLUCONATE 0.12 % MT SOLN
15.0000 mL | Freq: Once | OROMUCOSAL | Status: AC
  Administered 2023-04-09: 15 mL via OROMUCOSAL
  Filled 2023-04-09: qty 15

## 2023-04-09 MED ORDER — MIDAZOLAM HCL 2 MG/2ML IJ SOLN
INTRAMUSCULAR | Status: AC
  Filled 2023-04-09: qty 2

## 2023-04-09 MED ORDER — LIDOCAINE 2% (20 MG/ML) 5 ML SYRINGE
INTRAMUSCULAR | Status: DC | PRN
  Administered 2023-04-09: 100 mg via INTRAVENOUS

## 2023-04-09 MED ORDER — HYDROCORTISONE 5 MG PO TABS
5.0000 mg | ORAL_TABLET | Freq: Two times a day (BID) | ORAL | Status: AC
  Administered 2023-04-09 – 2023-04-11 (×4): 5 mg via ORAL
  Filled 2023-04-09 (×4): qty 1

## 2023-04-09 MED ORDER — SODIUM CHLORIDE 0.9 % IR SOLN
Status: DC | PRN
  Administered 2023-04-09: 3000 mL

## 2023-04-09 MED ORDER — ORAL CARE MOUTH RINSE: 15.0000 mL | Freq: Once | OROMUCOSAL | Status: AC

## 2023-04-09 MED ORDER — OXYCODONE HCL 5 MG PO TABS: 5.0000 mg | ORAL_TABLET | Freq: Once | ORAL | Status: DC | PRN

## 2023-04-09 MED ORDER — ETOMIDATE 2 MG/ML IV SOLN
INTRAVENOUS | Status: DC | PRN
  Administered 2023-04-09: 14 mg via INTRAVENOUS

## 2023-04-09 MED ORDER — LACTATED RINGERS IV SOLN: INTRAVENOUS | Status: DC

## 2023-04-09 MED ORDER — OXYCODONE HCL 5 MG/5ML PO SOLN: 5.0000 mg | Freq: Once | ORAL | Status: DC | PRN

## 2023-04-09 MED ORDER — PROPOFOL 10 MG/ML IV BOLUS
INTRAVENOUS | Status: AC
  Filled 2023-04-09: qty 20

## 2023-04-09 MED ORDER — FENTANYL CITRATE (PF) 250 MCG/5ML IJ SOLN
INTRAMUSCULAR | Status: DC | PRN
  Administered 2023-04-09 (×3): 50 ug via INTRAVENOUS

## 2023-04-09 MED ORDER — PHENYLEPHRINE 80 MCG/ML (10ML) SYRINGE FOR IV PUSH (FOR BLOOD PRESSURE SUPPORT)
PREFILLED_SYRINGE | INTRAVENOUS | Status: DC | PRN
  Administered 2023-04-09: 160 ug via INTRAVENOUS
  Administered 2023-04-09: 80 ug via INTRAVENOUS
  Administered 2023-04-09 (×2): 160 ug via INTRAVENOUS
  Administered 2023-04-09: 240 ug via INTRAVENOUS

## 2023-04-09 MED ORDER — PHENYLEPHRINE HCL-NACL 20-0.9 MG/250ML-% IV SOLN
INTRAVENOUS | Status: DC | PRN
  Administered 2023-04-09: 40 ug/min via INTRAVENOUS

## 2023-04-09 MED ORDER — MIDAZOLAM HCL 2 MG/2ML IJ SOLN
INTRAMUSCULAR | Status: DC | PRN
  Administered 2023-04-09: 1 mg via INTRAVENOUS

## 2023-04-09 MED ORDER — ONDANSETRON HCL 4 MG/2ML IJ SOLN
INTRAMUSCULAR | Status: DC | PRN
  Administered 2023-04-09: 4 mg via INTRAVENOUS

## 2023-04-09 MED ORDER — FENTANYL CITRATE (PF) 100 MCG/2ML IJ SOLN: 25.0000 ug | INTRAMUSCULAR | Status: DC | PRN

## 2023-04-09 MED ORDER — 0.9 % SODIUM CHLORIDE (POUR BTL) OPTIME
TOPICAL | Status: DC | PRN
  Administered 2023-04-09: 1000 mL

## 2023-04-09 MED ORDER — TAMSULOSIN HCL 0.4 MG PO CAPS
0.4000 mg | ORAL_CAPSULE | Freq: Every day | ORAL | Status: DC
  Administered 2023-04-09 – 2023-04-17 (×9): 0.4 mg via ORAL
  Filled 2023-04-09 (×9): qty 1

## 2023-04-09 MED ORDER — ONDANSETRON HCL 4 MG/2ML IJ SOLN
INTRAMUSCULAR | Status: AC
  Filled 2023-04-09: qty 2

## 2023-04-09 SURGICAL SUPPLY — 37 items
BAG COUNTER SPONGE SURGICOUNT (BAG) ×1 IMPLANT
BAG SPNG CNTER NS LX DISP (BAG)
BAG URO CATCHER STRL LF (MISCELLANEOUS) IMPLANT
BNDG GAUZE DERMACEA FLUFF 4 (GAUZE/BANDAGES/DRESSINGS) IMPLANT
BNDG GZE DERMACEA 4 6PLY (GAUZE/BANDAGES/DRESSINGS) ×1
CANISTER SUCT 3000ML PPV (MISCELLANEOUS) ×1 IMPLANT
CNTNR URN SCR LID CUP LEK RST (MISCELLANEOUS) ×1 IMPLANT
CONT SPEC 4OZ STRL OR WHT (MISCELLANEOUS)
COVER SURGICAL LIGHT HANDLE (MISCELLANEOUS) IMPLANT
DRAIN PENROSE 0.25X18 (DRAIN) IMPLANT
DRAPE LAPAROTOMY T 102X78X121 (DRAPES) ×1 IMPLANT
ELECT REM PT RETURN 9FT ADLT (ELECTROSURGICAL) IMPLANT
ELECTRODE REM PT RTRN 9FT ADLT (ELECTROSURGICAL) ×1 IMPLANT
GAUZE PAD ABD 8X10 STRL (GAUZE/BANDAGES/DRESSINGS) ×2 IMPLANT
GAUZE SPONGE 4X4 12PLY STRL (GAUZE/BANDAGES/DRESSINGS) ×1 IMPLANT
GLOVE SURG ORTHO 8.0 STRL STRW (GLOVE) ×1 IMPLANT
KIT BASIN OR (CUSTOM PROCEDURE TRAY) ×1 IMPLANT
KIT TURNOVER KIT B (KITS) ×1 IMPLANT
NS IRRIG 1000ML POUR BTL (IV SOLUTION) ×1 IMPLANT
PACK GENERAL/GYN (CUSTOM PROCEDURE TRAY) ×1 IMPLANT
PAD ARMBOARD 7.5X6 YLW CONV (MISCELLANEOUS) ×2 IMPLANT
PULSAVAC PLUS IRRIG FAN TIP (DISPOSABLE) ×1 IMPLANT
SOL PREP POV-IOD 4OZ 10% (MISCELLANEOUS) ×1 IMPLANT
SPONGE T-LAP 18X18 ~~LOC~~+RFID (SPONGE) IMPLANT
SUPPORT SCROTAL MED ADLT STRP (MISCELLANEOUS) IMPLANT
SUPPORT SCROTAL SMALL (MISCELLANEOUS) IMPLANT
SUT CHROMIC 3 0 SH 27 (SUTURE) ×1 IMPLANT
SUT ETHILON 2 0 FS 18 (SUTURE) IMPLANT
SUT PROLENE 4 0 PS 2 18 (SUTURE) ×1 IMPLANT
SUT VIC AB 3-0 SH 27 (SUTURE) ×1
SUT VIC AB 3-0 SH 27X BRD (SUTURE) IMPLANT
SUT VICRYL 4-0 PS2 18IN ABS (SUTURE) ×1 IMPLANT
SWAB COLLECTION DEVICE MRSA (MISCELLANEOUS) ×1 IMPLANT
TIP FAN IRRIG PULSAVAC PLUS (DISPOSABLE) IMPLANT
TOWEL GREEN STERILE (TOWEL DISPOSABLE) ×1 IMPLANT
TRAY FOLEY MTR SLVR 16FR STAT (SET/KITS/TRAYS/PACK) IMPLANT
WATER STERILE IRR 1000ML POUR (IV SOLUTION) ×1 IMPLANT

## 2023-04-09 NOTE — Anesthesia Procedure Notes (Signed)
Procedure Name: LMA Insertion Date/Time: 04/09/2023 4:15 PM  Performed by: Gus Puma, CRNAPre-anesthesia Checklist: Patient identified, Emergency Drugs available, Suction available and Patient being monitored Patient Re-evaluated:Patient Re-evaluated prior to induction Oxygen Delivery Method: Circle System Utilized Preoxygenation: Pre-oxygenation with 100% oxygen Induction Type: IV induction Ventilation: Mask ventilation without difficulty LMA: LMA inserted LMA Size: 4.0 Number of attempts: 1 Airway Equipment and Method: Bite block Placement Confirmation: positive ETCO2 Tube secured with: Tape Dental Injury: Teeth and Oropharynx as per pre-operative assessment

## 2023-04-09 NOTE — Anesthesia Preprocedure Evaluation (Signed)
Anesthesia Evaluation  Patient identified by MRN, date of birth, ID band Patient awake    Reviewed: Allergy & Precautions, H&P , NPO status , Patient's Chart, lab work & pertinent test results  Airway Mallampati: II   Neck ROM: full    Dental   Pulmonary sleep apnea    breath sounds clear to auscultation       Cardiovascular hypertension, + CAD, + Cardiac Stents and + Peripheral Vascular Disease  + dysrhythmias Atrial Fibrillation  Rhythm:regular Rate:Normal     Neuro/Psych TIA   GI/Hepatic   Endo/Other  diabetes, Type 2    Renal/GU      Musculoskeletal   Abdominal   Peds  Hematology   Anesthesia Other Findings   Reproductive/Obstetrics                             Anesthesia Physical Anesthesia Plan  ASA: 3  Anesthesia Plan: General   Post-op Pain Management:    Induction: Intravenous  PONV Risk Score and Plan: 2 and Ondansetron, Dexamethasone, Midazolam and Treatment may vary due to age or medical condition  Airway Management Planned: LMA  Additional Equipment:   Intra-op Plan:   Post-operative Plan: Extubation in OR  Informed Consent: I have reviewed the patients History and Physical, chart, labs and discussed the procedure including the risks, benefits and alternatives for the proposed anesthesia with the patient or authorized representative who has indicated his/her understanding and acceptance.     Dental advisory given  Plan Discussed with: CRNA, Anesthesiologist and Surgeon  Anesthesia Plan Comments:        Anesthesia Quick Evaluation

## 2023-04-09 NOTE — Progress Notes (Signed)
Pharmacy Antibiotic Note-Follow Up  Peter Yang is a 67 y.o. male for which pharmacy has been consulted for meropenem dosing for UTI. Patient comes from [facility] for evaluation of discolored urine and hematuria. He had an episode of urinary retention over the weekend on Saturday and foley catheter was placed. Today, staff noticed purulence and blood and was sent to ED for further evaluation.  Patient with a history of ESBL e. Coli and proteus in urine culture that was treated with IV carbapenem followed by 6 weeks of Bactrim. Patient urine culture unable to be located. MRI showed no evidence of osteo, but rim enhancing fluid/air collection compatible with abscess  Plan for wound closure today. ID recommended 4 wks of abx post closure.   Plan: Meropenem 1g IV every 8 hours Linezolid 600mg  IV every 12 hours Monitor renal function, WBC, fever curve   Height: 6\' 4"  (193 cm) Weight:  (UTA, due to bed weight disfunction) IBW/kg (Calculated) : 86.8  Temp (24hrs), Avg:97.8 F (36.6 C), Min:97.4 F (36.3 C), Max:98.4 F (36.9 C)  Recent Labs  Lab 04/03/23 1830 04/03/23 2110 04/04/23 0242 04/05/23 0106 04/06/23 0122 04/07/23 0135 04/08/23 0743 04/09/23 0053  WBC 17.5*  --    < > 13.1* 12.8* 11.1* 11.6* 15.3*  CREATININE  --   --    < > 1.03 1.09 1.01 0.84 0.81  LATICACIDVEN 1.6 1.9  --   --   --   --   --   --    < > = values in this interval not displayed.     Estimated Creatinine Clearance: 125.4 mL/min (by C-G formula based on SCr of 0.81 mg/dL).    Allergies  Allergen Reactions   Sglt2 Inhibitors     Fournier's gangrene    Antimicrobials this admission: Meropenem 6/11>> Linezolid 6/14>> Vanc 6/12>> 6/14 Clinda 6/12>>6/14   Microbiology results: 07/2022 Ucx: ESBL e. Coli + proteus (I-imipenem), not to meorpenem/ertapenem 6/11 Ucx: neg MRSA+   Ulyses Southward, PharmD, BCIDP, AAHIVP, CPP Infectious Disease Pharmacist 04/09/2023 8:59 AM

## 2023-04-09 NOTE — Evaluation (Signed)
Occupational Therapy Evaluation Patient Details Name: Peter Yang MRN: [id number] DOB: 07/31/1956 Today's Date: 04/09/2023   History of Present Illness 67 yo male with onset of foley cath collection of pus and blood was admitted on 6/11, received foley cath and then pain worsened.  Pt is admitted for UTI with ESBL e coli.  PMHx:  CHF, L BKA, CAD, HLD, DM, BPM,  NSTEMI   Clinical Impression   PT admitted with the above diagnosis from SNF and has the deficits listed below. Per pt (poor historian but feel fairly accurate with this history this am) pt has been in SNF since Oct and has been fairly bedbound since then. SNF uses hoyer to get pt OOB if he gets out of bed.  Pt has been dependent with most adls.  Pt previously independent prior to Oct 2023 with BKA.  Pt close to baseline but feel there is potential for improvement with adls and mobility if pt would agree to mobilize to EOB at minimum.  Will trial OT for goals listed below. Recommend pt continue rehab at less than 3 hours a day before returning home. Pt does not have someone to assist at home.       Recommendations for follow up therapy are one component of a multi-disciplinary discharge planning process, led by the attending physician.  Recommendations may be updated based on patient status, additional functional criteria and insurance authorization.   Assistance Recommended at Discharge Frequent or constant Supervision/Assistance  Patient can return home with the following Two people to help with bathing/dressing/bathroom;Assistance with cooking/housework;Direct supervision/assist for medications management;Direct supervision/assist for financial management;Assist for transportation;Help with stairs or ramp for entrance    Functional Status Assessment  Patient has had a recent decline in their functional status and demonstrates the ability to make significant improvements in function in a reasonable and predictable amount of time.   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      Mobility Bed Mobility Overal bed mobility: Needs Assistance Bed Mobility: Rolling Rolling: Mod assist         General bed mobility comments: refused further mobility    Transfers                   General transfer comment: refused more than rolling side to side      Balance                                           ADL either performed or assessed with clinical judgement   ADL Overall ADL's : Needs assistance/impaired Eating/Feeding: Set up;Sitting   Grooming: Wash/dry face;Oral care;Wash/dry hands;Applying deodorant;Brushing hair;Minimal assistance;Sitting   Upper Body Bathing: Minimal assistance;Sitting   Lower Body Bathing: Maximal assistance;Bed level   Upper Body Dressing : Maximal assistance   Lower Body Dressing: Total assistance;+2 for physical assistance       Toileting- Clothing Manipulation and Hygiene: Bed level;Total assistance;+2 for physical assistance       Functional mobility during ADLs: +2 for physical assistance General ADL Comments: All adls at bed level. Pt refusing to come to EOB  to attempt adls. Feel pt is most likely at baseline but also feel pt has some potential to improve if pt is interested.     Vision Baseline Vision/History: 1 Wears glasses Ability to See in Adequate Light: 1 Impaired Patient Visual Report: No change from baseline Vision Assessment?: Yes Eye Alignment: Within Functional Limits Ocular Range of Motion: Within Functional Limits Alignment/Gaze Preference: Within Defined Limits Tracking/Visual  Pursuits: Able to track stimulus in all quads without difficulty Saccades: Within functional limits Convergence: Within functional limits Visual Fields: No apparent deficits Additional Comments: Pt's glasses were crushed by EMS when they came to tend to him at his house last Oct and has not had glasses since so cannot see well far away.     Perception     Praxis      Pertinent Vitals/Pain Pain Assessment Pain Assessment: Faces Faces Pain Scale: Hurts little more Pain Location: bottom/catheter Pain Descriptors / Indicators: Burning, Sore Pain Intervention(s): Limited activity within patient's tolerance, Monitored during session, Repositioned     Hand Dominance Right   Extremity/Trunk Assessment Upper Extremity Assessment Upper Extremity Assessment: LUE deficits/detail LUE Deficits / Details: ROM 30 degrees flexion in shoulder. Two failed rotator cuff surgeries. Elbow flexion and extension WFL. Does not get to full extension. Shoulder 2/5, elbow 3+/5, grip 3+/5 LUE: Unable to fully assess due to pain;Shoulder pain at rest;Shoulder pain with ROM LUE Sensation: WNL LUE Coordination: decreased fine motor;decreased gross motor   Lower Extremity Assessment Lower Extremity Assessment: Defer to PT evaluation LLE Deficits / Details: knee flexion contracture LLE Coordination: decreased gross motor   Cervical / Trunk Assessment Cervical / Trunk Assessment: Kyphotic   Communication Communication Communication: No difficulties   Cognition Arousal/Alertness: Lethargic Behavior During Therapy: Flat affect Overall Cognitive Status: History of cognitive impairments - at baseline                                 General Comments: Pt with eyes closed through session but when asked to open them, he did. Pt was a bit better at conversation than PT eval yesterday.  Slightly more awake. Unwilling to get to EOB and mobilize.     General Comments  pt refused to sit EOB     Exercises     Shoulder Instructions      Home Living Family/patient expects to be discharged to:: Skilled nursing facility                                 Additional Comments: Pt states he has not stood on his leg since Oct. Hoyer lift used in SNF      Prior Functioning/Environment Prior Level of Function : Needs assist       Physical Assist : Mobility (physical) Mobility (physical): Gait   Mobility Comments: reports last time walked as Oct 2023 ADLs Comments: Pt has been dependent with most adls since in SNF since Oct. Pt can feed self and groom.        OT Problem List: Decreased strength;Decreased range of motion;Decreased activity tolerance;Impaired balance (sitting and/or standing);Decreased coordination;Decreased cognition;Decreased safety awareness;Decreased knowledge of use of DME or AE;Decreased knowledge of precautions;Obesity;Impaired UE functional use;Pain      OT Treatment/Interventions: Self-care/ADL training;DME and/or AE instruction    OT Goals(Current goals can be found in the care plan section) Acute Rehab OT Goals Patient Stated Goal: to  get out of here OT Goal Formulation: Patient unable to participate in goal setting Time For Goal Achievement: 04/23/23 Potential to Achieve Goals: Fair ADL Goals Pt Will Perform Grooming: with set-up;bed level Pt Will Perform Upper Body Bathing: with set-up;bed level Pt Will Perform Lower Body Bathing: with mod assist;bed level Additional ADL Goal #1: Pt will sit on EOB for one grooming task with min assist.  OT Frequency: Min 1X/week    Co-evaluation              AM-PAC OT "6 Clicks" Daily Activity     Outcome Measure Help from another person eating meals?: A Little Help from another person taking care of personal grooming?: A Little Help from another person toileting, which includes using toliet, bedpan, or urinal?: Total Help from another person bathing (including washing, rinsing, drying)?: A  Lot Help from another person to put on and taking off regular upper body clothing?: A Lot Help from another person to put on and taking off regular lower body clothing?: Total 6 Click Score: 12   End of Session Nurse Communication: Mobility status  Activity Tolerance: Patient limited by lethargy Patient left: in bed;with call bell/phone within reach  OT Visit Diagnosis: Unsteadiness on feet (R26.81);Muscle weakness (generalized) (M62.81)                Time: 7846-9629 OT Time Calculation (min): 24 min Charges:  OT General Charges $OT Visit: 1 Visit OT Evaluation $OT Eval Moderate Complexity: 1 Mod  Hope Budds 04/09/2023, 9:35 AM

## 2023-04-09 NOTE — Progress Notes (Signed)
Brief ID Note -   Plans for patient to go to OR today for another exploration of deep groin wounds. Noted problems with over sedation vs pain from dressing changes.  Urology's assessment yesterday 6/18 with beefy pink tissue throughout w/o any areas of necrosis. Does have some urethral erosion present d/t catheter tension and diffuse groin/thigh anasarca.  Continue meropenem and linezolid. With potential of septic arthritis of pubis on MRI as well as deep infection that was contiguous to the bone, would plan on PICC line placement for him tomorrow if he is agreeable. He seemed somewhat resistant to this when I spoke with him last about it.   Will follow up in person tomorrow for OR findings.   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    Rexene Alberts, MSN, NP-C [facility]  [email or web address] Pager: [phone] [phone] [facility] Main Line: [phone] *Secure Chat Communication Welcome

## 2023-04-09 NOTE — Progress Notes (Signed)
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  Patient had acute urinary retention on Saturday, June 8.  He had a Foley catheter placed at that time.     CT scan showed findings c/w fournier's gangrene.  Urology was consulted.  He's post op from I&D on 6/12.  Plan to go back to OR 6/19 for wound closure.   Cardiology has been consulted with concern for heart failure/volume overload, but they noted his poor cardiovascular prognosis and did not suspect he was in decompensated heart failure.     At this time, on antibiotics per ID (plan for 4 weeks after last surgical procedure).   He's got significant edema and I'm trialing lasix with parameters given the significant edema around his wound.    See below for additional details.    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 Volume overloaded on exam with dependent edema on bilateral thighs. CXR with increased density in both lower lung fields, bilateral effusions, possible atelectasis/pneumonia (clinically not c/w pneumonia) Strict I/O, daily weights Cardiology notes limited cardiovascular options, but did not think he was in decompensated HF. Note poor cardiovascular prognosis.  See 6/16 note.  Palliative care consulted.  Lasix was on hold with prior cards recs and hypotension however keeping in mind about his dependent edema, he was resumed on Lasix 40 mg IV twice daily and I will continue that since blood pressure has stable.  Hypotension Relatively chronic it sounds.  He remains on stress dose steroids.  Currently on 10 mg Solu-Cortef in the morning and 5 at night.  Will medicate 5 twice daily for 1 to 2 days.  Continue midodrine 5 mg 3 times daily.   Hypervolemic Hyponatremia Remains grossly overloaded on exam.  Sodium stable around 130.  Monitor closely.   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  No evidence of osteo.  Diffuse myositis throughout pelvic and thigh musculature.  Plan for OR for closure  Wed 6/19 ID planning 4 weeks abx.  ID to see formally tomorrow and discuss PICC line with the patient. 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   Gross hematuria Due to foley? This also seems to be known, history of this Eliquis currently on hold, will resume post op when ok with urology - has pending procedure 6/19, will follow  Urine culture with no growth     Type 2 diabetes mellitus with diabetic peripheral angiopathy without gangrene, without long-term current use of insulin (HCC) Continue with SSI and 20 units of lantus.  Blood sugar controlled.   History of urinary retention Foley catheter placed on Saturday 03-29-2023.  Previous notes suggest that he is supposed to be on Flomax but he is not.  I will start him on Flomax.  Will need to discuss with urology about the timing of removing the Foley catheter.  PAF (paroxysmal atrial fibrillation) (HCC) Stable. He is not on any rate control medications.   Essential hypertension Stable. Has hx of orthostatic hypotension.   Long term current use of systemic steroids Pt remains on cortef bid. Unclear why he is still on cortef.  Appears to have started in October/November 2023. I think these were started due to his chronic hypotension.  I have started to wean today.  Would be worth an endocrine referral if he's able to make it vs a slow gradual wean of his steroids he's been on since last year.   Prostate Cancer Follow outpatient with urology  DVT prophylaxis: SCDs Start: 04/02/23 0044   Code Status: DNR  Family Communication:  None present at bedside.  Plan of care discussed with patient in length and he/she verbalized understanding and agreed with it.  Status is: Inpatient Remains inpatient appropriate because: He is scheduled to go to the OR today.   Estimated body mass index is 31.34 kg/m as calculated from the following:   Height as of this encounter: 6\' 4"  (1.93 m).   Weight as of this  encounter: 116.8 kg.  Pressure Injury 08/03/22 Buttocks Mid Stage 2 -  Partial thickness loss of dermis presenting as a shallow open injury with a red, pink wound bed without slough. area of red blanchable and non blanchable, and areas with skin sloughed off (Active)  08/03/22 1513  Location: Buttocks  Location Orientation: Mid  Staging: Stage 2 -  Partial thickness loss of dermis presenting as a shallow open injury with a red, pink wound bed without slough.  Wound Description (Comments): area of red blanchable and non blanchable, and areas with skin sloughed off  Present on Admission: Yes  Dressing Type Foam - Lift dressing to assess site every shift 04/09/23 0923   Nutritional Assessment: Body mass index is 31.34 kg/m.[name] Seen by dietician.  I agree with the assessment and plan as outlined below: Nutrition Status: Nutrition Problem: Increased nutrient needs Etiology: wound healing Signs/Symptoms: estimated needs Interventions: Premier Protein, Juven  . Skin Assessment: I have examined the patient's skin and I agree with the wound assessment as performed by the wound care RN as outlined below: Pressure Injury 08/03/22 Buttocks Mid Stage 2 -  Partial thickness loss of dermis presenting as a  shallow open injury with a red, pink wound bed without slough. area of red blanchable and non blanchable, and areas with skin sloughed off (Active)  08/03/22 1513  Location: Buttocks  Location Orientation: Mid  Staging: Stage 2 -  Partial thickness loss of dermis presenting as a shallow open injury with a red, pink wound bed without slough.  Wound Description (Comments): area of red blanchable and non blanchable, and areas with skin sloughed off  Present on Admission: Yes  Dressing Type Foam - Lift dressing to assess site every shift 04/09/23 0923    Consultants:  Urology and ID  Procedures:  As above  Antimicrobials:  Anti-infectives (From admission, onward)    Start     Dose/Rate Route  Frequency Ordered Stop   04/04/23 1000  linezolid (ZYVOX) IVPB 600 mg        600 mg 300 mL/hr over 60 Minutes Intravenous Every 12 hours 04/04/23 0902     04/04/23 0200  vancomycin (VANCOCIN) IVPB 1000 mg/200 mL premix  Status:  Discontinued        1,000 mg 200 mL/hr over 60 Minutes Intravenous Every 12 hours 04/03/23 1936 04/04/23 0902   04/03/23 1400  vancomycin (VANCOREADY) IVPB 1500 mg/300 mL  Status:  Discontinued        1,500 mg 150 mL/hr over 120 Minutes Intravenous Every 12 hours 04/03/23 1302 04/03/23 1936   04/02/23 1400  vancomycin (VANCOCIN) IVPB 1000 mg/200 mL premix  Status:  Discontinued        1,000 mg 200 mL/hr over 60 Minutes Intravenous Every 12 hours 04/02/23 0036 04/03/23 1302   04/02/23 0115  vancomycin (VANCOREADY) IVPB 2000 mg/400 mL        2,000 mg 200 mL/hr over 120 Minutes Intravenous  Once 04/02/23 0015 04/02/23 0529   04/02/23 0100  clindamycin (CLEOCIN) IVPB 900 mg  Status:  Discontinued        900 mg 100 mL/hr over 30 Minutes Intravenous Every 8 hours 04/02/23 0002 04/02/23 0005   04/02/23 0100  clindamycin (CLEOCIN) IVPB 900 mg  Status:  Discontinued        900 mg 100 mL/hr over 30 Minutes Intravenous Every 8 hours 04/02/23 0012 04/04/23 0902   04/02/23 0000  meropenem (MERREM) 1 g in sodium chloride 0.9 % 100 mL IVPB        1 g 200 mL/hr over 30 Minutes Intravenous Every 8 hours 04/01/23 2352     04/01/23 2215  meropenem (MERREM) 2 g in sodium chloride 0.9 % 100 mL IVPB  Status:  Discontinued        2 g 280 mL/hr over 30 Minutes Intravenous  Once 04/01/23 2221 04/01/23 2352   04/01/23 1800  cefTRIAXone (ROCEPHIN) 1 g in sodium chloride 0.9 % 100 mL IVPB        1 g 200 mL/hr over 30 Minutes Intravenous  Once 04/01/23 1746 04/01/23 1902         Subjective: Seen and examined.  Complains of pain.  Tells me that he was told by the nurse that he is not due for the pain medication yet.  Reviewed his pain medications and it appears that he is on Dilaudid  every 2 hours and oxycodone every 4 hours.  I informed him about that.  His last dose was given at 5:21 AM.  He said he has not asked the nurses since then.  Objective: Vitals:   04/09/23 0753 04/09/23 0900 04/09/23 0914 04/09/23 1156  BP: 93/64 94/67 99/66  104/63  Pulse: 74 74  73  Resp: 15 15  12   Temp: 98.4 F (36.9 C)   (!) 97.5 F (36.4 C)  TempSrc: Oral   Oral  SpO2: 99% 99%  99%  Weight:      Height:        Intake/Output Summary (Last 24 hours) at 04/09/2023 1237 Last data filed at 04/09/2023 0517 Gross per 24 hour  Intake 1120 ml  Output 4450 ml  Net -3330 ml   Filed Weights   04/06/23 0611 04/07/23 0708 04/08/23 0712  Weight: 115.9 kg 117.1 kg 116.8 kg    Examination:  General exam: Appears calm and comfortable  Respiratory system: Clear to auscultation. Respiratory effort normal. Cardiovascular system: S1 & S2 heard, RRR. No JVD, murmurs, rubs, gallops or clicks. No pedal edema. Gastrointestinal system: Abdomen is nondistended, soft and nontender. No organomegaly or masses felt. Normal bowel sounds heard. Central nervous system: Alert and oriented. No focal neurological deficits. Extremities: Symmetric 5 x 5 power.   Data Reviewed: I have personally reviewed following labs and imaging studies  CBC: Recent Labs  Lab 04/04/23 0242 04/05/23 0106 04/06/23 0122 04/07/23 0135 04/08/23 0743 04/09/23 0053  WBC 15.5* 13.1* 12.8* 11.1* 11.6* 15.3*  NEUTROABS 14.2* 11.5* 9.2* 8.8*  --  12.2*  HGB 8.6* 8.8* 9.7* 9.3* 10.1* 10.1*  HCT 29.0* 28.7* 31.5* 30.4* 32.7* 33.4*  MCV 79.0* 78.2* 78.9* 77.4* 78.6* 76.8*  PLT 268 300 357 321 301 323   Basic Metabolic Panel: Recent Labs  Lab 04/04/23 0242 04/05/23 0106 04/06/23 0122 04/07/23 0135 04/08/23 0743 04/09/23 0053  NA 127* 127* 126* 129* 130* 130*  K 4.0 3.8 4.2 3.9 4.2 3.9  CL 98 97* 96* 97* 100 98  CO2 20* 19* 21* 22 22 22   GLUCOSE 258* 292* 188* 248* 150* 188*  BUN 35* 50* 61* 60* 52* 48*   CREATININE 1.25* 1.03 1.09 1.01 0.84 0.81  CALCIUM 7.8* 8.2* 8.3* 8.3* 8.2* 8.1*  MG 2.3 2.0 2.1 2.1  --  1.8  PHOS 3.6 3.2 3.1 2.7  --  2.6   GFR: Estimated Creatinine Clearance: 125.4 mL/min (by C-G formula based on SCr of 0.81 mg/dL). Liver Function Tests: Recent Labs  Lab 04/04/23 0242 04/05/23 0106 04/06/23 0122 04/07/23 0135 04/09/23 0053  AST 21 17 20 16 23   ALT 16 16 16 16 15   ALKPHOS 70 65 60 57 60  BILITOT 0.7 0.7 0.7 0.7 1.0  PROT 5.1* 5.3* 5.6* 5.3* 5.1*  ALBUMIN 1.6* 2.2* 2.5* 2.4* 2.2*   No results for input(s): "LIPASE", "AMYLASE" in the last 168 hours. No results for input(s): "AMMONIA" in the last 168 hours. Coagulation Profile: No results for input(s): "INR", "PROTIME" in the last 168 hours. Cardiac Enzymes: No results for input(s): "CKTOTAL", "CKMB", "CKMBINDEX", "TROPONINI" in the last 168 hours. BNP (last 3 results) No results for input(s): "PROBNP" in the last 8760 hours. HbA1C: No results for input(s): "HGBA1C" in the last 72 hours. CBG: Recent Labs  Lab 04/08/23 1251 04/08/23 1630 04/08/23 2058 04/09/23 0602 04/09/23 1201  GLUCAP 188* 152* 159* 142* 123*   Lipid Profile: No results for input(s): "CHOL", "HDL", "LDLCALC", "TRIG", "CHOLHDL", "LDLDIRECT" in the last 72 hours. Thyroid Function Tests: No results for input(s): "TSH", "T4TOTAL", "FREET4", "T3FREE", "THYROIDAB" in the last 72 hours. Anemia Panel: No results for input(s): "VITAMINB12", "FOLATE", "FERRITIN", "TIBC", "IRON", "RETICCTPCT" in the last 72 hours. Sepsis Labs: Recent Labs  Lab 04/03/23 1830 04/03/23 2110  LATICACIDVEN 1.6  1.9    Recent Results (from the past 240 hour(s))  Urine Culture     Status: None   Collection Time: 04/01/23  5:46 PM   Specimen: Urine, Catheterized  Result Value Ref Range Status   Specimen Description URINE, CATHETERIZED  Final   Special Requests NONE  Final   Culture   Final    NO GROWTH Performed at [facility], [address] [place], [place] [address]    Report Status 04/02/2023 FINAL  Final  MRSA Next Gen by PCR, Nasal     Status: Abnormal   Collection Time: 04/02/23  1:00 AM   Specimen: Nasal Mucosa; Nasal Swab  Result Value Ref Range Status   MRSA by PCR Next Gen DETECTED (A) NOT DETECTED Final    Comment: RESULT CALLED TO, READ BACK BY AND VERIFIED WITH: RN KAITLYN BLUCHER ON 04/02/23 @ 1515 BY DRT (NOTE) The GeneXpert MRSA Assay (FDA approved for NASAL specimens only), is one component of a comprehensive MRSA colonization surveillance program. It is not intended to diagnose MRSA infection nor to guide or monitor treatment for MRSA infections. Test performance is not FDA approved in patients less than 63 years old. Performed at [facility], [address], [place], [place] [address]      Radiology Studies: No results found.  Scheduled Meds:  atorvastatin  80 mg Oral Daily   Chlorhexidine Gluconate Cloth  6 each Topical Daily   furosemide  40 mg Intravenous BID   gabapentin  300 mg Oral QHS   hydrocortisone  10 mg Oral Daily   And   hydrocortisone  5 mg Oral QHS    HYDROmorphone (DILAUDID) injection  1 mg Intravenous Once   insulin aspart  0-15 Units Subcutaneous TID WC   insulin aspart  0-5 Units Subcutaneous QHS   insulin glargine-yfgn  20 Units Subcutaneous QHS   midodrine  5 mg Oral TID with meals   nutrition supplement (JUVEN)  1 packet Oral BID BM   pantoprazole  40 mg Oral Daily   polyethylene glycol  17 g Oral BID   Ensure Max Protein  11 oz Oral BID   Continuous Infusions:  sodium chloride 10 mL/hr at 04/08/23 2211   linezolid (ZYVOX) IV 600 mg (04/09/23 0929)   meropenem (MERREM) IV 1 g (04/09/23 0517)     LOS: 7 days   Hughie Closs, MD [facility]  04/09/2023, 12:37 PM   *Please note that this is a verbal dictation therefore any spelling or grammatical errors are due to the "Dragon Medical One" system interpretation.  Please page via Amion and do not  message via secure chat for urgent patient care matters. Secure chat can be used for non urgent patient care matters.  How to contact the [facility] Attending or Consulting provider 7A - 7P or covering provider during after hours 7P -7A, for this patient?  Check the care team in [facility] and look for a) attending/consulting TR[facility] provider listed and b) the [facility] team listed. Page or secure chat 7A-7P. Log into [email or web address] and use [facility]'s universal password to access. If you do not have the password, please contact the hospital operator. Locate the [facility] provider you are looking for under [facility] and page to a number that you can be directly reached. If you still have difficulty reaching the provider, please page the [facility] (Director on Call) for the Hospitalists listed on amion for assistance.

## 2023-04-09 NOTE — Interval H&P Note (Signed)
History and Physical Interval Note:  04/09/2023 3:53 PM  Peter Yang  has presented today for surgery, with the diagnosis of FOURNIER'S GANGRENE.  The various methods of treatment have been discussed with the patient and family. After consideration of risks, benefits and other options for treatment, the patient has consented to  Procedure(s) with comments: SCROTAL WASHOUT AND DEBRIDEMENT, POSSIBLE SCROTAL WOUND CLOSURE (N/A) - 60 MINUTES as a surgical intervention.  [name] history has been reviewed, patient examined, no change in status, stable for surgery.  I have reviewed the patient's chart and labs.  Questions were answered to the patient's satisfaction.     Crist Fat

## 2023-04-09 NOTE — Progress Notes (Signed)
Patient wounds were debrided and irrigated.  I did open a small pocket of fluid in the suprapubic area on the patient's left.  The tissue actually looked quite good otherwise and as such I opted to close the wound.  I did place to Penrose drains on each either side and brought them through the dependent region of the scrotum.  Plan will continue to follow along, and leave the Penrose drains in for several weeks.

## 2023-04-09 NOTE — Op Note (Signed)
Preoperative diagnosis:  Necrotizing soft tissue infection of the suprapubic and inguinal region  Postoperative diagnosis:  Same  Procedure: Inguinal and scrotal wound washout and closure. Foley catheter change, 16 [place]  Surgeon: Crist Fat, MD  Anesthesia: General  Complications: None  Intraoperative findings:  #1: The soft tissue in the suprapubic was fibrinous, and with finger dissection and probing I was able to open up a small pocket of fluid which then drained some purulent fluid.  I debrided the tissue down to healthy bleeding tissue both the incisions and the suprapubic region.  The testicle and scrotal area were otherwise healthy appearing.  #2:  The tissue did appear healthy enough to close.  I closed that in interrupted stitches with some fairly big gaps in between to allow drainage and swelling.    #3: I placed a Penrose drain on each side extending up into the suprapubic region and coming out through the dependent scrotal skin.  EBL: Minimal  Specimens: None  Indication: Peter Yang is a 67 y.o. patient with Fournier's gangrene/necrotizing soft tissue infection of the suprapubic and inguinal region.  We initially opened him up bilaterally in these regions to drain the infection and he has been packed and getting wet-to-dry dressing changes every day.  We discussed the options of wound washout and potential closure of these incisions today.  After reviewing the management options for treatment, he elected to proceed with the above surgical procedure(s). We have discussed the potential benefits and risks of the procedure, side effects of the proposed treatment, the likelihood of the patient achieving the goals of the procedure, and any potential problems that might occur during the procedure or recuperation. Informed consent has been obtained.  Description of procedure:   The patient was brought back to the operating room placed on the table in supine position.   General anesthesia was then induced endotracheal tube was inserted.  He was placed in he was prepped and draped in the routine sterile fashion a timeout was performed.  I removed the patient's Foley catheter as well as the packing.  Inspection of the area demonstrated granulating tissue around the testicles bilaterally and within the scrotum.  There was some fibrinous material along the sidewall and the deeper tissue super pubic region.  I debrided this area sharply and finger dissected around and unroofed a another fluid collection which was fairly small but drained some turbid fluid.  Once all the fibrinous material had been debrided I used the pulse irrigator and irrigated the area with about a liter and a half normal saline.  I achieved hemostasis with a few Vicryl stitches.  I then placed a Penrose drain on each side extending into the suprapubic region and coming through the skin and the most dependent portions of the scrotum.  I then closed the scrotal incisions bilaterally with 2-0 nylon in a vertical mattress interrupted pattern with sizable gaps in between to allow for drainage and/or swelling of the tissue.  I then replaced a 16 French Foley catheter with atraumatically into the patient's bladder.  The wounds were then dressed with 4 x 4 Kerlix unfurled and ABD followed by mesh underpants.  He was subsequently extubated return the PACU in stable condition.

## 2023-04-09 NOTE — Transfer of Care (Signed)
Immediate Anesthesia Transfer of Care Note  Patient: Peter Yang  Procedure(s) Performed: Lambert Mody AND DEBRIDEMENT WITH WOUND CLOSURE  Patient Location: PACU  Anesthesia Type:General  Level of Consciousness: drowsy and patient cooperative  Airway & Oxygen Therapy: Patient connected to face mask oxygen  Post-op Assessment: Report given to RN and Post -op Vital signs reviewed and stable  Post vital signs: Reviewed and stable  Last Vitals:  Vitals Value Taken Time  BP 103/70 04/09/23 1745  Temp    Pulse 60 04/09/23 1747  Resp 9 04/09/23 1747  SpO2 100 % 04/09/23 1747  Vitals shown include unvalidated device data.  Last Pain:  Vitals:   04/09/23 1504  TempSrc: Oral  PainSc: 0-No pain      Patients Stated Pain Goal: 0 (04/05/23 0222)  Complications: There were no known notable events for this encounter.

## 2023-04-09 NOTE — Plan of Care (Signed)
Plan of care reviewed. Pt is alert ands fully oriented x 4, stable hemodynamically, sinus rhythm with 1st AV block and unifocal PVCs on the monitor per CCMD reported. Pt remains afebrile, no acute distress noted tonight. Pain is well controlled with Dilaudid and Oxycodone PRN. He is able to rest well tonight.   Problem: Education: Goal: Ability to describe self-care measures that may prevent or decrease complications (Diabetes Survival Skills Education) will improve Outcome: Progressing   Problem: Coping: Goal: Ability to adjust to condition or change in health will improve Outcome: Progressing   Problem: Fluid Volume: Goal: Ability to maintain a balanced intake and output will improve Outcome: Progressing   Problem: Nutritional: Goal: Maintenance of adequate nutrition will improve Outcome: Progressing   Problem: Skin Integrity:  Goal: Risk for impaired skin integrity will decrease Outcome: Progressing   Problem: Pain Managment:  Goal: General experience of comfort will improve Outcome: Progressing  Filiberto Pinks, RN

## 2023-04-10 ENCOUNTER — Other Ambulatory Visit: Payer: Self-pay

## 2023-04-10 ENCOUNTER — Encounter (HOSPITAL_COMMUNITY): Payer: Self-pay | Admitting: Urology

## 2023-04-10 DIAGNOSIS — T8789 Other complications of amputation stump: Secondary | ICD-10-CM

## 2023-04-10 DIAGNOSIS — R1030 Lower abdominal pain, unspecified: Secondary | ICD-10-CM

## 2023-04-10 DIAGNOSIS — Z8619 Personal history of other infectious and parasitic diseases: Secondary | ICD-10-CM

## 2023-04-10 DIAGNOSIS — N492 Inflammatory disorders of scrotum: Secondary | ICD-10-CM

## 2023-04-10 LAB — CBC WITH DIFFERENTIAL/PLATELET
Abs Immature Granulocytes: 0.03 10*3/uL (ref 0.00–0.07)
Basophils Absolute: 0.1 10*3/uL (ref 0.0–0.1)
Basophils Relative: 1 %
Eosinophils Absolute: 0.6 10*3/uL — ABNORMAL HIGH (ref 0.0–0.5)
Eosinophils Relative: 6 %
HCT: 32.2 % — ABNORMAL LOW (ref 39.0–52.0)
Hemoglobin: 9.9 g/dL — ABNORMAL LOW (ref 13.0–17.0)
Immature Granulocytes: 0 %
Lymphocytes Relative: 10 %
Lymphs Abs: 1 10*3/uL (ref 0.7–4.0)
MCH: 24.2 pg — ABNORMAL LOW (ref 26.0–34.0)
MCHC: 30.7 g/dL (ref 30.0–36.0)
MCV: 78.7 fL — ABNORMAL LOW (ref 80.0–100.0)
Monocytes Absolute: 0.6 10*3/uL (ref 0.1–1.0)
Monocytes Relative: 7 %
Neutro Abs: 7.4 10*3/uL (ref 1.7–7.7)
Neutrophils Relative %: 76 %
Platelets: 215 10*3/uL (ref 150–400)
RBC: 4.09 MIL/uL — ABNORMAL LOW (ref 4.22–5.81)
RDW: 19.1 % — ABNORMAL HIGH (ref 11.5–15.5)
WBC: 9.6 10*3/uL (ref 4.0–10.5)
nRBC: 0 % (ref 0.0–0.2)

## 2023-04-10 LAB — BASIC METABOLIC PANEL
Anion gap: 7 (ref 5–15)
BUN: 37 mg/dL — ABNORMAL HIGH (ref 8–23)
CO2: 26 mmol/L (ref 22–32)
Calcium: 7.9 mg/dL — ABNORMAL LOW (ref 8.9–10.3)
Chloride: 97 mmol/L — ABNORMAL LOW (ref 98–111)
Creatinine, Ser: 0.89 mg/dL (ref 0.61–1.24)
GFR, Estimated: >60 mL/min (ref >60)
Glucose, Bld: 208 mg/dL — ABNORMAL HIGH (ref 70–99)
Potassium: 3.8 mmol/L (ref 3.5–5.1)
Sodium: 130 mmol/L — ABNORMAL LOW (ref 135–145)

## 2023-04-10 LAB — GLUCOSE, CAPILLARY
Glucose-Capillary: 146 mg/dL — ABNORMAL HIGH (ref 70–99)
Glucose-Capillary: 233 mg/dL — ABNORMAL HIGH (ref 70–99)
Glucose-Capillary: 187 mg/dL — ABNORMAL HIGH (ref 70–99)
Glucose-Capillary: 179 mg/dL — ABNORMAL HIGH (ref 70–99)

## 2023-04-10 LAB — CK: Total CK: 24 U/L — ABNORMAL LOW (ref 49–397)

## 2023-04-10 MED ORDER — ZINC SULFATE 220 (50 ZN) MG PO CAPS
220.0000 mg | ORAL_CAPSULE | Freq: Every day | ORAL | Status: DC
  Administered 2023-04-11 – 2023-04-18 (×8): 220 mg via ORAL
  Filled 2023-04-10 (×8): qty 1

## 2023-04-10 MED ORDER — SODIUM CHLORIDE 0.9 % IV SOLN
1.0000 g | INTRAVENOUS | Status: DC
  Administered 2023-04-10 – 2023-04-17 (×8): 1000 mg via INTRAVENOUS
  Filled 2023-04-10 (×9): qty 1

## 2023-04-10 MED ORDER — SODIUM CHLORIDE 0.9 % IV SOLN
8.0000 mg/kg | Freq: Every day | INTRAVENOUS | Status: DC
  Administered 2023-04-10 – 2023-04-17 (×8): 800 mg via INTRAVENOUS
  Filled 2023-04-10 (×9): qty 16

## 2023-04-10 MED ORDER — ENSURE ENLIVE PO LIQD
237.0000 mL | Freq: Three times a day (TID) | ORAL | Status: DC
  Administered 2023-04-10 – 2023-04-18 (×21): 237 mL via ORAL

## 2023-04-10 MED ORDER — VITAMIN C 500 MG PO TABS
500.0000 mg | ORAL_TABLET | Freq: Two times a day (BID) | ORAL | Status: DC
  Administered 2023-04-11 – 2023-04-18 (×15): 500 mg via ORAL
  Filled 2023-04-10 (×15): qty 1

## 2023-04-10 MED ORDER — SODIUM CHLORIDE 0.9% FLUSH
10.0000 mL | Freq: Two times a day (BID) | INTRAVENOUS | Status: DC
  Administered 2023-04-10 – 2023-04-14 (×8): 10 mL
  Administered 2023-04-15: 30 mL
  Administered 2023-04-16 – 2023-04-18 (×4): 10 mL

## 2023-04-10 MED ORDER — OXYCODONE HCL 5 MG PO TABS
5.0000 mg | ORAL_TABLET | Freq: Once | ORAL | Status: AC
  Administered 2023-04-10: 5 mg via ORAL
  Filled 2023-04-10: qty 1

## 2023-04-10 MED ORDER — MIDODRINE HCL 5 MG PO TABS
10.0000 mg | ORAL_TABLET | Freq: Three times a day (TID) | ORAL | Status: DC
  Administered 2023-04-10 – 2023-04-12 (×7): 10 mg via ORAL
  Filled 2023-04-10 (×7): qty 2

## 2023-04-10 MED ORDER — SODIUM CHLORIDE 0.9% FLUSH
10.0000 mL | INTRAVENOUS | Status: DC | PRN
  Administered 2023-04-10 – 2023-04-11 (×2): 20 mL

## 2023-04-10 MED ORDER — APIXABAN 5 MG PO TABS
5.0000 mg | ORAL_TABLET | Freq: Two times a day (BID) | ORAL | Status: DC
  Administered 2023-04-10 – 2023-04-14 (×10): 5 mg via ORAL
  Filled 2023-04-10 (×10): qty 1

## 2023-04-10 MED ORDER — ACETAMINOPHEN 500 MG PO TABS
1000.0000 mg | ORAL_TABLET | Freq: Three times a day (TID) | ORAL | Status: DC
  Administered 2023-04-10 – 2023-04-18 (×24): 1000 mg via ORAL
  Filled 2023-04-10 (×24): qty 2

## 2023-04-10 MED ORDER — HYDROMORPHONE HCL 1 MG/ML IJ SOLN: 0.5000 mg | Freq: Once | INTRAMUSCULAR | Status: DC

## 2023-04-10 NOTE — Progress Notes (Signed)
PHARMACY CONSULT NOTE FOR:  OUTPATIENT  PARENTERAL ANTIBIOTIC THERAPY (OPAT)  Indication: Fournier's gangrene  Regimen: ertapenem 1g IV Q24H + Daptomycin 800 mg IV Q24H- please pull PICC at completion of IV antibiotics  End date: 05/20/23 (6w from closure)   IV antibiotic discharge orders are pended. To discharging provider:  please sign these orders via discharge navigator,  Select New Orders & click on the button choice - Manage This Unsigned Work.     Thank you for allowing pharmacy to be a part of this patient's care.  Jani Gravel, PharmD PGY-2 Infectious Diseases Resident  04/10/2023 10:39 AM

## 2023-04-10 NOTE — Progress Notes (Signed)
Patient ID: Peter Yang, male   DOB: September 10, 1956, 67 y.o.   MRN: [id number]    Progress Note from the Palliative Medicine Team at [facility]   Patient Name: Peter Yang        Date: 04/10/2023 DOB: 06-11-1956  Age: 67 y.o. MRN#: [id number] Attending Physician: Hughie Closs, MD Primary Care Physician: Annita Brod, MD Admit Date: 04/01/2023   Extensive chart review has been completed prior to meeting with patient/family  including labs, vital signs, imaging, progress/consult notes, orders, medications and available advance directive documents.    This NP assessed patient at the bedside as a follow up for palliative medicine needs and emotional support.  Garrick looks better today.  He still speaks to hope for better pain control.  We discussed pain management strategies in light of his significant hypotension.   We explored the positive findings after his procedure yesterday for inguinal and scrotal wound washout and closure.     According to the urology op note, area was debrided down to healthy bleeding tissue.  Testicle and scrotal area was healthy in appearance.  Education offered on as wound healing progresses hope is for improvement in pain experience.        We discussed the importance of a holistic approach to pain and symptom management.  Education offered on the benefits of positive thoughts and affirmations.  Education offered on the importance of mobility and motivation and  his  participation in therapies.        Symptom management :   Pain in groin area:    -add Tylenol 1000 mg p.o. every 8 scheduled   Questions and concerns addressed   Discussed with nursing   PMT will continue to support holistically.  I let Jorgeluis know I would be out of the hospital until Sunday morning and I will follow-up with him at that time.  He is encouraged to call PMT with questions or concerns.   Time: 50 minutes  Detailed review of medical records ( labs, imaging, vital signs),  medically appropriate exam ( MS, skin, resp)   discussed with treatment team, counseling and education to patient, family, staff, documenting clinical information, medication management, coordination of care    Lorinda Creed NP  Palliative Medicine Team Team Phone # [phone] Pager [phone]

## 2023-04-10 NOTE — TOC Progression Note (Signed)
Transition of Care [facility]) - Progression Note    Patient Details  Name: Peter Yang MRN: [id number] Date of Birth: Jan 10, 1956  Transition of [facility]) CM/SW Contact  Eduard Roux, [place] Phone Number: 04/10/2023, 3:56 PM  Clinical Narrative:     Received call form [name]- patient may not be able to return- he owes the facility money and has not been agreeable to discuss with their business office.   He has been there since 04/11- he is short term only.   TOC will continue to follow and assist with discharge planning.   Antony Blackbird, MSW, LCSW Clinical Social Worker    Expected Discharge Plan: Skilled Nursing Facility Barriers to Discharge: Continued Medical Work up  Expected Discharge Plan and Services   Discharge Planning Services: CM Consult Post Acute Care Choice: Skilled Nursing Facility Living arrangements for the past 2 months: Skilled Nursing Facility                                       Social Determinants of Health (SDOH) Interventions SDOH Screenings   Food Insecurity: No Food Insecurity (04/02/2023)  Housing: Low Risk  (04/02/2023)  Transportation Needs: No Transportation Needs (04/02/2023)  Utilities: Not At Risk (04/02/2023)  Tobacco Use: Low Risk  (04/10/2023)    Readmission Risk Interventions    10/24/2022   11:21 AM 09/02/2022   10:36 AM  Readmission Risk Prevention Plan  Transportation Screening Complete Complete  Medication Review Oceanographer) Complete Complete  PCP or Specialist appointment within 3-5 days of discharge Complete Complete  HRI or Home Care Consult Complete Complete  SW Recovery Care/Counseling Consult Complete Complete  Palliative Care Screening Not Applicable Not Applicable  Skilled Nursing Facility Complete Complete

## 2023-04-10 NOTE — Progress Notes (Signed)
Peripherally Inserted Central Catheter Placement  The IV Nurse has discussed with the patient and/or persons authorized to consent for the patient, the purpose of this procedure and the potential benefits and risks involved with this procedure.  The benefits include less needle sticks, lab draws from the catheter, and the patient may be discharged home with the catheter. Risks include, but not limited to, infection, bleeding, blood clot (thrombus formation), and puncture of an artery; nerve damage and irregular heartbeat and possibility to perform a PICC exchange if needed/ordered by physician.  Alternatives to this procedure were also discussed.  Bard Power PICC patient education guide, fact sheet on infection prevention and patient information card has been provided to patient /or left at bedside.    PICC Placement Documentation  PICC Single Lumen 04/10/23 Right Basilic 43 cm 0 cm (Active)  Indication for Insertion or Continuance of Line Prolonged intravenous therapies 04/10/23 1450  Exposed Catheter (cm) 0 cm 04/10/23 1450  Site Assessment Clean, Dry, Intact 04/10/23 1450  Line Status Flushed;Saline locked;Blood return noted 04/10/23 1450  Dressing Type Transparent;Securing device 04/10/23 1450  Dressing Status Antimicrobial disc in place;Clean, Dry, Intact 04/10/23 1450  Dressing Intervention New dressing;Other (Comment) 04/10/23 1450  Dressing Change Due 04/17/23 04/10/23 1450       Maximino Greenland 04/10/2023, 3:17 PM

## 2023-04-10 NOTE — Progress Notes (Signed)
[facility]  Date of Admission:  04/01/2023      Total days of antibiotics 8  Meropenem 6/11>>6/20 Linezolid 6/14>>6/20 Vanc 6/12>> 6/14 Clinda 6/12>>6/14  Daptomycin 6/20>>c  Ertapenem 6/20>>c          ASSESSMENT: Peter Yang is a 67 y.o. 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 Will switch to daptomycin + ertapenem with ESBL e coli history. He does not need toxin inhibition anymore given health appearing tissue. He has penrose drains in place but the wound was primarily closed yesterday.  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  -D/W Dr. Jacqulyn Bath.   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 He will need better attention of this at [facility] if hopes to heal. Covered under current plan if any residual osteomyelitis  Disposition -  -planning back to SNF / [facility]   ID will sign off - please call back with any questions/concerns or if we can be of further assistance.    PLAN: PICC line to be placed  Wound care for foot and groin IV OPAT as outlined below.    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    Labs weekly while on IV antibiotics: _x_ CBC with differential __ BMP **TWICE WEEKLY ON VANCOMYCIN  _x_ CMP _x_ CRP _x_ ESR __ Vancomycin trough TWICE WEEKLY _x_ CK  _x_ Please pull PIC at completion of IV antibiotics __ Please leave PIC in place until doctor has seen patient or been notified  Fax weekly labs to [phone]  Clinic Follow Up Appt: 7/18 @ 4:00 pm (check in around 3 weeks on dapto to ensure tolerance)    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)   Long term current use of systemic steroids   Fournier's gangrene in male   Fournier disease  Necrotizing soft tissue infection    acetaminophen  1,000 mg Oral Q8H   apixaban  5 mg Oral BID   atorvastatin  80 mg Oral Daily   Chlorhexidine Gluconate Cloth  6 each Topical Daily   furosemide  40 mg Intravenous BID   gabapentin  300 mg Oral QHS   hydrocortisone  5 mg Oral BID    HYDROmorphone (DILAUDID) injection  0.5 mg Intravenous Once    HYDROmorphone (DILAUDID) injection  1 mg Intravenous Once   insulin aspart  0-15 Units Subcutaneous TID WC   insulin aspart  0-5 Units Subcutaneous QHS   insulin glargine-yfgn  20 Units Subcutaneous QHS   midodrine  10 mg Oral TID with  meals   nutrition supplement (JUVEN)  1 packet Oral BID BM   pantoprazole  40 mg Oral Daily   polyethylene glycol  17 g Oral BID   Ensure Max Protein  11 oz Oral BID   tamsulosin  0.4 mg Oral QPC supper    SUBJECTIVE: Feeling a little better today. Pain is more controlled. OK with placing PICC Line again if needed for treatment of this infection. He thinks he is going to go back to [facility] for ongoing care.  Does not report he was getting wound care regularly over there for his foot. Still with some pain there as well.   He says his PIV site in RUE is stinging and started today with that.    Review of Systems: Review of Systems  Constitutional:  Negative for chills and fever.  Gastrointestinal:  Negative for abdominal pain, diarrhea and vomiting.  Genitourinary:  Negative for dysuria.  Musculoskeletal:  Positive for joint pain.  Neurological:  Negative for dizziness and focal weakness.     Allergies  Allergen Reactions   Sglt2 Inhibitors     Fournier's gangrene    OBJECTIVE: Vitals:   04/10/23 0600 04/10/23 0700 04/10/23 0815 04/10/23 1153  BP: (!) 102/59 92/63 91/61  (!) 87/55  Pulse: 88 62 78   Resp: 15 15 14 14   Temp: 98 F (36.7 C)  98.5 F (36.9 C) (!) 97.5 F (36.4 C)  TempSrc: Oral  Oral Oral  SpO2:   95% 99%  Weight:      Height:       Body mass index is 31.34 kg/m.   Physical Exam Constitutional:      Appearance: He is ill-appearing.  Cardiovascular:     Rate and Rhythm: Normal rate and regular rhythm.  Abdominal:     General: Bowel sounds are normal. There is no distension.     Tenderness: There is no abdominal tenderness.  Musculoskeletal:        General: Swelling: generalized swelling.     Comments: Left foot with non-healing wound. No drainage observed on dressing material at this time.   Skin:    Capillary Refill: Capillary refill takes less than 2 seconds.  Neurological:     Mental Status: He is alert and oriented to person, place, and  time.       Lab Results  Lab Results  Component Value Date   WBC 15.3 (H) 04/09/2023   HGB 10.1 (L) 04/09/2023   HCT 33.4 (L) 04/09/2023   MCV 76.8 (L) 04/09/2023   PLT 323 04/09/2023    Lab Results  Component Value Date   CREATININE 0.81 04/09/2023   BUN 48 (H) 04/09/2023   NA 130 (L) 04/09/2023   K 3.9 04/09/2023   CL 98 04/09/2023   CO2  22 04/09/2023    Lab Results  Component Value Date   ALT 15 04/09/2023   AST 23 04/09/2023   ALKPHOS 60 04/09/2023   BILITOT 1.0 04/09/2023     Microbiology: Recent Results (from the past 240 hour(s))  Urine Culture     Status: None   Collection Time: 04/01/23  5:46 PM   Specimen: Urine, Catheterized  Result Value Ref Range Status   Specimen Description URINE, CATHETERIZED  Final   Special Requests NONE  Final   Culture   Final    NO GROWTH Performed at [facility], [address], [place], [place] [address]    Report Status 04/02/2023 FINAL  Final  MRSA Next Gen by PCR, Nasal     Status: Abnormal   Collection Time: 04/02/23  1:00 AM   Specimen: Nasal Mucosa; Nasal Swab  Result Value Ref Range Status   MRSA by PCR Next Gen DETECTED (A) NOT DETECTED Final    Comment: RESULT CALLED TO, READ BACK BY AND VERIFIED WITH: RN KAITLYN BLUCHER ON 04/02/23 @ 1515 BY DRT (NOTE) The GeneXpert MRSA Assay (FDA approved for NASAL specimens only), is one component of a comprehensive MRSA colonization surveillance program. It is not intended to diagnose MRSA infection nor to guide or monitor treatment for MRSA infections. Test performance is not FDA approved in patients less than 103 years old. Performed at [facility], [address] [place], [place] [address]     Rexene Alberts, MSN, NP-C [facility]  [email or web address] Pager: [phone] Office: [phone] [facility] Main Line: [phone] *Secure Chat Communication Welcome

## 2023-04-10 NOTE — Progress Notes (Signed)
  X-cover Note: Repeated calls the last 2 nights about pt's pain control. Have offered additional dose of po oxycodone. Pt will need to address his pain control with attending in the AM.  Carollee Herter, DO [facility]

## 2023-04-10 NOTE — Progress Notes (Signed)
PROGRESS NOTE    Peter Yang  ZOX:[id number] DOB: 1956/05/22 DOA: 04/01/2023 PCP: Annita Brod, MD   Brief Narrative:  67 year old male with a history of left below-knee amputation, chronic systolic heart failure, history of A-fib, recent admission in April 2024 at [facility] for right diabetic foot ulcer status post fifth ray amputation, hypertension, type 2 diabetes who presents to the ER today from [place] nursing home due to pus and blood from his Foley catheter.  Patient had acute urinary retention on Saturday, June 8.  He had a Foley catheter placed at that time.     CT scan showed findings c/w fournier's gangrene.  Urology was consulted.  He's post op from I&D on 6/12.  Plan to go back to OR 6/19 for wound closure.   Cardiology has been consulted with concern for heart failure/volume overload, but they noted his poor cardiovascular prognosis and did not suspect he was in decompensated heart failure.     At this time, on antibiotics per ID (plan for 4 weeks after last surgical procedure).   He's got significant edema and I'm trialing lasix with parameters given the significant edema around his wound.    See below for additional details.    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 Volume overloaded on exam with dependent edema on bilateral thighs. CXR with increased density in both lower lung fields, bilateral effusions, possible atelectasis/pneumonia (clinically not c/w pneumonia) Strict I/O, daily weights Cardiology notes limited cardiovascular options, but did not think he was in decompensated HF. Note poor cardiovascular prognosis.  See 6/16 note.  Palliative care consulted.  Lasix was on hold with prior cards recs and hypotension however keeping in mind about his dependent edema, he was resumed on Lasix 40 mg IV twice daily, he had significant diuresis, and I will continue that since blood pressure has stable.  Hypotension Relatively chronic it sounds.  He remains on stress dose steroids.  Was on 10 mg Solu-Cortef in the morning and 5 at night.  I reduced 5 twice daily starting 04/09/2023.  Blood pressure very low, will increase midodrine to 10 mg 3 times daily.    Hypervolemic Hyponatremia Remains grossly overloaded on exam.  Sodium stable around 130.  Monitor closely.   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  No evidence of osteo.  Diffuse myositis throughout pelvic and thigh musculature.  Underwent wound washout and closure with exchange of the Foley catheter by Dr. Marlou Porch 04/09/2023. ID planning 4 weeks abx.  ID to see formally tomorrow and discuss PICC line with the patient. 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   Gross hematuria Due to foley? This also seems to be known, history of this Eliquis currently on hold, will resume post op when ok with urology -have sent a secure chat message. Urine culture with no growth     Type 2 diabetes mellitus with diabetic peripheral angiopathy without gangrene, without long-term current use of insulin (HCC) Continue with SSI and 20 units of lantus.  Blood sugar controlled.   History of urinary retention Foley catheter placed on Saturday 03-29-2023.  Previous notes suggest that he is supposed to be on Flomax but he is not.  Started him on Flomax, he underwent Foley exchange 04/09/2023.  PAF (paroxysmal atrial fibrillation) (HCC) Stable. He is not on any rate control medications.   Essential hypertension Stable. Has hx of orthostatic hypotension.   Long term current use of systemic steroids Pt remains on cortef bid. Unclear why he is still on cortef.  Appears to have started in October/November 2023. I think these were started due to his chronic hypotension.  I have started to wean 04/09/2023.  Would be worth an endocrine referral if he's able to make it vs a slow gradual wean of his steroids he's been on since last year.   Prostate Cancer Follow outpatient with urology  DVT prophylaxis: SCDs Start: 04/02/23 0044   Code Status: DNR  Family Communication:  None present at bedside.  Plan of care discussed with patient in length and he/she verbalized understanding and agreed with it.  Status is: Inpatient Remains inpatient appropriate because: Needs PT OT assessment and then eventually will require SNF discharge when cleared by  urology.   Estimated body mass index is 31.34 kg/m as calculated from the following:   Height as of this encounter: 6\' 4"  (1.93 m).   Weight as of this encounter: 116.8 kg.  Pressure Injury 08/03/22 Buttocks Mid Stage 2 -  Partial thickness loss of dermis presenting as a shallow open injury with a red, pink wound bed without slough. area of red blanchable and non blanchable, and areas with skin sloughed off (Active)  08/03/22 1513  Location: Buttocks  Location Orientation: Mid  Staging: Stage 2 -  Partial thickness loss of dermis presenting as a shallow open injury with a red, pink wound bed without slough.  Wound Description (Comments): area of red blanchable and non blanchable, and areas with skin sloughed off  Present on Admission: Yes  Dressing Type Foam - Lift dressing to assess site every shift 04/10/23 0833   Nutritional Assessment: Body mass index is 31.34 kg/m.[name] Seen by dietician.  I agree with the assessment and plan as outlined below: Nutrition Status: Nutrition Problem: Increased nutrient needs Etiology: wound healing Signs/Symptoms: estimated needs Interventions: Premier Protein, Juven  . Skin Assessment: I have examined the patient's skin and I agree with the wound assessment as performed by the wound care RN as outlined below: Pressure Injury 08/03/22 Buttocks Mid Stage 2 -  Partial  thickness loss of dermis presenting as a shallow open injury with a red, pink wound bed without slough. area of red blanchable and non blanchable, and areas with skin sloughed off (Active)  08/03/22 1513  Location: Buttocks  Location Orientation: Mid  Staging: Stage 2 -  Partial thickness loss of dermis presenting as a shallow open injury with a red, pink wound bed without slough.  Wound Description (Comments): area of red blanchable and non blanchable, and areas with skin sloughed off  Present on Admission: Yes  Dressing Type Foam - Lift dressing to assess site every shift 04/10/23  0833    Consultants:  Urology and ID  Procedures:  As above  Antimicrobials:  Anti-infectives (From admission, onward)    Start     Dose/Rate Route Frequency Ordered Stop   04/04/23 1000  linezolid (ZYVOX) IVPB 600 mg        600 mg 300 mL/hr over 60 Minutes Intravenous Every 12 hours 04/04/23 0902     04/04/23 0200  vancomycin (VANCOCIN) IVPB 1000 mg/200 mL premix  Status:  Discontinued        1,000 mg 200 mL/hr over 60 Minutes Intravenous Every 12 hours 04/03/23 1936 04/04/23 0902   04/03/23 1400  vancomycin (VANCOREADY) IVPB 1500 mg/300 mL  Status:  Discontinued        1,500 mg 150 mL/hr over 120 Minutes Intravenous Every 12 hours 04/03/23 1302 04/03/23 1936   04/02/23 1400  vancomycin (VANCOCIN) IVPB 1000 mg/200 mL premix  Status:  Discontinued        1,000 mg 200 mL/hr over 60 Minutes Intravenous Every 12 hours 04/02/23 0036 04/03/23 1302   04/02/23 0115  vancomycin (VANCOREADY) IVPB 2000 mg/400 mL        2,000 mg 200 mL/hr over 120 Minutes Intravenous  Once 04/02/23 0015 04/02/23 0529   04/02/23 0100  clindamycin (CLEOCIN) IVPB 900 mg  Status:  Discontinued        900 mg 100 mL/hr over 30 Minutes Intravenous Every 8 hours 04/02/23 0002 04/02/23 0005   04/02/23 0100  clindamycin (CLEOCIN) IVPB 900 mg  Status:  Discontinued        900 mg 100 mL/hr over 30 Minutes Intravenous Every 8 hours 04/02/23 0012 04/04/23 0902   04/02/23 0000  meropenem (MERREM) 1 g in sodium chloride 0.9 % 100 mL IVPB        1 g 200 mL/hr over 30 Minutes Intravenous Every 8 hours 04/01/23 2352     04/01/23 2215  meropenem (MERREM) 2 g in sodium chloride 0.9 % 100 mL IVPB  Status:  Discontinued        2 g 280 mL/hr over 30 Minutes Intravenous  Once 04/01/23 2221 04/01/23 2352   04/01/23 1800  cefTRIAXone (ROCEPHIN) 1 g in sodium chloride 0.9 % 100 mL IVPB        1 g 200 mL/hr over 30 Minutes Intravenous  Once 04/01/23 1746 04/01/23 1902         Subjective:  Patient seen and examined.  Yet  again complains of groin pain and complains that he is not getting the pain medications even after requesting to the nurses.  Discussed with RN about this.  Objective: Vitals:   04/10/23 0400 04/10/23 0600 04/10/23 0700 04/10/23 0815  BP: (!) 95/58 (!) 102/59 92/63 91/61   Pulse: 85 88 62 78  Resp: 14 15 15 14   Temp:  98 F (36.7 C)  98.5 F (36.9 C)  TempSrc: Oral Oral  Oral  SpO2:    95%  Weight:      Height:        Intake/Output Summary (Last 24 hours) at 04/10/2023 1033 Last data filed at 04/10/2023 0518 Gross per 24 hour  Intake 1778.17 ml  Output 4875 ml  Net -3096.83 ml    Filed Weights   04/07/23 0708 04/08/23 0712  Weight: 117.1 kg 116.8 kg    Examination:  General exam: Appears calm and comfortable  Respiratory system: Clear to auscultation. Respiratory effort normal. Cardiovascular system: S1 & S2 heard, RRR. No JVD, murmurs, rubs, gallops or clicks. No pedal edema. Gastrointestinal system: Abdomen is nondistended, soft and nontender. No organomegaly or masses felt. Normal bowel sounds heard. Central nervous system: Alert and oriented. No focal neurological deficits. Extremities: Symmetric 5 x 5 power.   Data Reviewed: I have personally reviewed following labs and imaging studies  CBC: Recent Labs  Lab 04/04/23 0242 04/05/23 0106 04/06/23 0122 04/07/23 0135 04/08/23 0743 04/09/23 0053  WBC 15.5* 13.1* 12.8* 11.1* 11.6* 15.3*  NEUTROABS 14.2* 11.5* 9.2* 8.8*  --  12.2*  HGB 8.6* 8.8* 9.7* 9.3* 10.1* 10.1*  HCT 29.0* 28.7* 31.5* 30.4* 32.7* 33.4*  MCV 79.0* 78.2* 78.9* 77.4* 78.6* 76.8*  PLT 268 300 357 321 301 323    Basic Metabolic Panel: Recent Labs  Lab 04/04/23 0242 04/05/23 0106 04/06/23 0122 04/07/23 0135 04/08/23 0743 04/09/23 0053  NA 127* 127* 126* 129* 130* 130*  K 4.0 3.8 4.2 3.9 4.2 3.9  CL 98 97* 96* 97* 100 98  CO2 20* 19* 21* 22 22 22   GLUCOSE 258* 292* 188* 248* 150* 188*  BUN 35* 50* 61* 60* 52* 48*  CREATININE 1.25*  1.03 1.09 1.01 0.84 0.81  CALCIUM 7.8* 8.2* 8.3* 8.3* 8.2* 8.1*  MG 2.3 2.0 2.1 2.1  --  1.8  PHOS 3.6 3.2 3.1 2.7  --  2.6    GFR: Estimated Creatinine Clearance: 125.4 mL/min (by C-G formula based on SCr of 0.81 mg/dL). Liver Function Tests: Recent Labs  Lab 04/04/23 0242 04/05/23 0106 04/06/23 0122 04/07/23 0135 04/09/23 0053  AST 21 17 20 16 23   ALT 16 16 16 16 15   ALKPHOS 70 65 60 57 60  BILITOT 0.7 0.7 0.7 0.7 1.0  PROT 5.1* 5.3* 5.6* 5.3* 5.1*  ALBUMIN 1.6* 2.2* 2.5* 2.4* 2.2*    No results for input(s): "LIPASE", "AMYLASE" in the last 168 hours. No results for input(s): "AMMONIA" in the last 168 hours. Coagulation Profile: No results for input(s): "INR", "PROTIME" in the last 168 hours. Cardiac Enzymes: No results for input(s): "CKTOTAL", "CKMB", "CKMBINDEX", "TROPONINI" in the last 168 hours. BNP (last 3 results) No results for input(s): "PROBNP" in the last 8760 hours. HbA1C: No results for input(s): "HGBA1C" in the last 72 hours. CBG: Recent Labs  Lab 04/09/23 1201 04/09/23 1441 04/09/23 1746 04/09/23 2111 04/10/23 0554  GLUCAP 123* 107* 93 147* 146*    Lipid Profile: No results for input(s): "CHOL", "HDL", "LDLCALC", "TRIG", "CHOLHDL", "LDLDIRECT" in the last 72 hours. Thyroid Function Tests: No results for input(s): "TSH", "T4TOTAL", "FREET4", "T3FREE", "THYROIDAB" in the last 72 hours. Anemia Panel: No results for input(s): "VITAMINB12", "FOLATE", "FERRITIN", "TIBC", "IRON", "RETICCTPCT" in the last 72 hours. Sepsis Labs: Recent Labs  Lab 04/03/23 1830 04/03/23 2110  LATICACIDVEN 1.6 1.9     Recent Results (from the past 240 hour(s))  Urine Culture     Status: None   Collection Time: 04/01/23  5:46 PM   Specimen:  Urine, Catheterized  Result Value Ref Range Status   Specimen Description URINE, CATHETERIZED  Final   Special Requests NONE  Final   Culture   Final    NO GROWTH Performed at [facility], [address],  [place], [place] [address]    Report Status 04/02/2023 FINAL  Final  MRSA Next Gen by PCR, Nasal     Status: Abnormal   Collection Time: 04/02/23  1:00 AM   Specimen: Nasal Mucosa; Nasal Swab  Result Value Ref Range Status   MRSA by PCR Next Gen DETECTED (A) NOT DETECTED Final    Comment: RESULT CALLED TO, READ BACK BY AND VERIFIED WITH: RN KAITLYN BLUCHER ON 04/02/23 @ 1515 BY DRT (NOTE) The GeneXpert MRSA Assay (FDA approved for NASAL specimens only), is one component of a comprehensive MRSA colonization surveillance program. It is not intended to diagnose MRSA infection nor to guide or monitor treatment for MRSA infections. Test performance is not FDA approved in patients less than 15 years old. Performed at [facility], [address], [place], [place] [address]      Radiology Studies: No results found.  Scheduled Meds:  atorvastatin  80 mg Oral Daily   Chlorhexidine Gluconate Cloth  6 each Topical Daily   furosemide  40 mg Intravenous BID   gabapentin  300 mg Oral QHS   hydrocortisone  5 mg Oral BID    HYDROmorphone (DILAUDID) injection  0.5 mg Intravenous Once    HYDROmorphone (DILAUDID) injection  1 mg Intravenous Once   insulin aspart  0-15 Units Subcutaneous TID WC   insulin aspart  0-5 Units Subcutaneous QHS   insulin glargine-yfgn  20 Units Subcutaneous QHS   midodrine  5 mg Oral TID with meals   nutrition supplement (JUVEN)  1 packet Oral BID BM   pantoprazole  40 mg Oral Daily   polyethylene glycol  17 g Oral BID   Ensure Max Protein  11 oz Oral BID   tamsulosin  0.4 mg Oral QPC supper   Continuous Infusions:  sodium chloride 10 mL/hr at 04/08/23 2211   linezolid (ZYVOX) IV 600 mg (04/10/23 0837)   meropenem (MERREM) IV 1 g (04/10/23 0606)     LOS: 8 days   Hughie Closs, MD [facility]  04/10/2023, 10:33 AM   *Please note that this is a verbal dictation therefore any spelling or grammatical errors are due to the "Dragon [employer] One" system  interpretation.  Please page via Amion and do not message via secure chat for urgent patient care matters. Secure chat can be used for non urgent patient care matters.  How to contact the [facility] Attending or Consulting provider 7A - 7P or covering provider during after hours 7P -7A, for this patient?  Check the care team in [facility] and look for a) attending/consulting TRH provider listed and b) the [facility] team listed. Page or secure chat 7A-7P. Log into [email or web address] and use [facility] universal password to access. If you do not have the password, please contact the hospital operator. Locate the [facility] provider you are looking for under [facility] and page to a number that you can be directly reached. If you still have difficulty reaching the provider, please page the [facility] (Director on Call) for the Hospitalists listed on amion for assistance.

## 2023-04-10 NOTE — Progress Notes (Signed)
Nutrition Follow-up  DOCUMENTATION CODES:   Not applicable  INTERVENTION:  - Regular diet.   - Ensure Plus High Protein po TID, each supplement provides 350 kcal and 20 grams of protein. - Juven BID, each packet provides 95 calories, 2.5 grams of protein (collagen), and 9.8 grams of carbohydrate (3 grams sugar); also contains 7 grams of L-arginine and L-glutamine, 300 mg vitamin C, 15 mg vitamin E, 1.2 mcg vitamin B-12, 9.5 mg zinc, 200 mg calcium, and 1.5 g  Calcium Beta-hydroxy-Beta-methylbutyrate to support wound healing  - Order 500mg  vitamin C BID x30 days and 220mg  zinc x14 days to promote wound healing. - Check vitamin A, Vitamin D, vitamin B12, and copper due to fournier's gangrene.   - Monitor weight trends.    NUTRITION DIAGNOSIS:   Increased nutrient needs related to wound healing as evidenced by estimated needs. *ongoing  GOAL:   Patient will meet greater than or equal to 90% of their needs *unmet  MONITOR:   PO intake, Supplement acceptance  REASON FOR ASSESSMENT:   Malnutrition Screening Tool    ASSESSMENT:   67 y.o. male admits related to urinary retention. PMH includes: L BKA, chronic systolic heart failure, HTN, T2DM. Pt is currently receiving medical management related to UTI.  Attempted x2 to speak with patient via telephone but no answer.   Patient previously documented to be consuming 50-100% of meals but over the past 2 days he has been consuming only 0-10%. Nursing documentation in flow sheets indicate patient with poor appetite.   Thankfully, has been drinking Ensure Max and Juven daily.   Patient with diagnosis of Fournier's gangrene. Will order vitamin A, vitamin D, copper, and vitamin B12 labs.  Will also order patient vitamin C and zinc supplementation to promote wound healing.  Change patient to Ensure Plus High Protein for additional calories due to poor intake.   Admit weight: 260# Current weight: 257#  Medications reviewed and  include: Lasix, Miralax  Labs reviewed:  No BMP today HA1C 7.0 Blood Glucose 93-233 x24 hours   Diet Order:   Diet Order             Diet regular Room service appropriate? Yes with Assist; Fluid consistency: Thin  Diet effective now                   EDUCATION NEEDS:  Not appropriate for education at this time  Skin:  Skin Assessment: Skin Integrity Issues: Skin Integrity Issues:: Stage II Stage II: Mid buttocks  Last BM:  6/19  Height:  Ht Readings from Last 1 Encounters:  04/02/23 6\' 4"  (1.93 m)   Weight:  Wt Readings from Last 1 Encounters:  08/16/22 90.9 kg    BMI:  Body mass index is 31.34 kg/m.  Estimated Nutritional Needs:  Kcal:  2450-2650 kcals Protein:  125-150 grams Fluid:  >/= 2.4L or per MD    Shelle Iron RD, LDN For contact information, refer to [facility].

## 2023-04-10 NOTE — Progress Notes (Signed)
Pharmacy Antibiotic Note-Follow Up  Gram Caple is a 67 y.o. male for which pharmacy has been consulted for meropenem dosing for UTI. Patient comes from [facility] for evaluation of discolored urine and hematuria. He had an episode of urinary retention over the weekend on Saturday and foley catheter was placed. Today, staff noticed purulence and blood and was sent to ED for further evaluation.  Patient with a history of ESBL e. Coli and proteus in urine culture that was treated with IV carbapenem followed by 6 weeks of Bactrim. Patient urine culture unable to be located. MRI showed no evidence of osteo, but rim enhancing fluid/air collection compatible with abscess. Pharmacy consulted for daptomycin and ertapenem dosing. Noted patient is on lipitor 80 mg.   S/p wound closure 6/19 with no culture data available.   Plan: Stop meropenem and linezolid  START daptomycin 800 mg IV (~8mg /kg AdjBW w/ BMI>30)  Start ertapnem 1g IV Q24H  Monitor renal function, WBC, fever curve, CK weekly    Height: 6\' 4"  (193 cm) Weight:  (UTA due to bed scale has been broken and Pt is total care.) IBW/kg (Calculated) : 86.8  Temp (24hrs), Avg:97.7 F (36.5 C), Min:97.2 F (36.2 C), Max:98.6 F (37 C)  Recent Labs  Lab 04/03/23 1830 04/03/23 2110 04/04/23 0242 04/05/23 0106 04/06/23 0122 04/07/23 0135 04/08/23 0743 04/09/23 0053  WBC 17.5*  --    < > 13.1* 12.8* 11.1* 11.6* 15.3*  CREATININE  --   --    < > 1.03 1.09 1.01 0.84 0.81  LATICACIDVEN 1.6 1.9  --   --   --   --   --   --    < > = values in this interval not displayed.     Estimated Creatinine Clearance: 125.4 mL/min (by C-G formula based on SCr of 0.81 mg/dL).    Allergies  Allergen Reactions   Sglt2 Inhibitors     Fournier's gangrene    Antimicrobials this admission: Meropenem 6/11>>6/20 Linezolid 6/14>>6/20 Vanc 6/12>> 6/14 Clinda 6/12>>6/14  Daptomycin 6/20>>c  Ertapenem 6/20>>c   Microbiology results: 07/2022 Ucx:  ESBL e. Coli + proteus (I-imipenem), not to meorpenem/ertapenem 6/11 Ucx: neg MRSA+  Jani Gravel, PharmD PGY-2 Infectious Diseases Resident  04/10/2023 12:00 PM

## 2023-04-11 LAB — VITAMIN B12: Vitamin B-12: 824 pg/mL (ref 180–914)

## 2023-04-11 LAB — CBC WITH DIFFERENTIAL/PLATELET
Abs Immature Granulocytes: 0.04 10*3/uL (ref 0.00–0.07)
Basophils Absolute: 0.1 10*3/uL (ref 0.0–0.1)
Basophils Relative: 1 %
Eosinophils Absolute: 0.4 10*3/uL (ref 0.0–0.5)
Eosinophils Relative: 4 %
HCT: 32.3 % — ABNORMAL LOW (ref 39.0–52.0)
Hemoglobin: 10 g/dL — ABNORMAL LOW (ref 13.0–17.0)
Immature Granulocytes: 1 %
Lymphocytes Relative: 11 %
Lymphs Abs: 0.9 10*3/uL (ref 0.7–4.0)
MCH: 23.6 pg — ABNORMAL LOW (ref 26.0–34.0)
MCHC: 31 g/dL (ref 30.0–36.0)
MCV: 76.4 fL — ABNORMAL LOW (ref 80.0–100.0)
Monocytes Absolute: 0.6 10*3/uL (ref 0.1–1.0)
Monocytes Relative: 7 %
Neutro Abs: 6.8 10*3/uL (ref 1.7–7.7)
Neutrophils Relative %: 76 %
Platelets: 222 10*3/uL (ref 150–400)
RBC: 4.23 MIL/uL (ref 4.22–5.81)
RDW: 19.2 % — ABNORMAL HIGH (ref 11.5–15.5)
WBC: 8.7 10*3/uL (ref 4.0–10.5)
nRBC: 0 % (ref 0.0–0.2)

## 2023-04-11 LAB — GLUCOSE, CAPILLARY
Glucose-Capillary: 161 mg/dL — ABNORMAL HIGH (ref 70–99)
Glucose-Capillary: 116 mg/dL — ABNORMAL HIGH (ref 70–99)
Glucose-Capillary: 167 mg/dL — ABNORMAL HIGH (ref 70–99)
Glucose-Capillary: 201 mg/dL — ABNORMAL HIGH (ref 70–99)
Glucose-Capillary: 163 mg/dL — ABNORMAL HIGH (ref 70–99)

## 2023-04-11 LAB — BASIC METABOLIC PANEL
Anion gap: 12 (ref 5–15)
BUN: 37 mg/dL — ABNORMAL HIGH (ref 8–23)
CO2: 26 mmol/L (ref 22–32)
Calcium: 8.3 mg/dL — ABNORMAL LOW (ref 8.9–10.3)
Chloride: 94 mmol/L — ABNORMAL LOW (ref 98–111)
Creatinine, Ser: 0.82 mg/dL (ref 0.61–1.24)
GFR, Estimated: >60 mL/min (ref >60)
Glucose, Bld: 184 mg/dL — ABNORMAL HIGH (ref 70–99)
Potassium: 3.9 mmol/L (ref 3.5–5.1)
Sodium: 132 mmol/L — ABNORMAL LOW (ref 135–145)

## 2023-04-11 LAB — LACTIC ACID, PLASMA: Lactic Acid, Venous: 1.6 mmol/L (ref 0.5–1.9)

## 2023-04-11 LAB — VITAMIN D 25 HYDROXY (VIT D DEFICIENCY, FRACTURES): Vit D, 25-Hydroxy: 28.96 ng/mL — ABNORMAL LOW (ref 30–100)

## 2023-04-11 MED ORDER — OXYCODONE HCL ER 10 MG PO T12A
10.0000 mg | EXTENDED_RELEASE_TABLET | Freq: Two times a day (BID) | ORAL | Status: DC
  Administered 2023-04-11 – 2023-04-16 (×9): 10 mg via ORAL
  Filled 2023-04-11 (×9): qty 1

## 2023-04-11 NOTE — Discharge Instructions (Signed)

## 2023-04-11 NOTE — TOC Progression Note (Signed)
Transition of Care [facility]) - Progression Note    Patient Details  Name: Peter Yang MRN: [id number] Date of Birth: 19-Sep-1956  [facility] CM/SW Contact  Eduard Roux, [place] Phone Number: 04/11/2023, 12:18 PM  Clinical Narrative:     CSW met with patient at bedside. CSW explained per [name], he has an balance and would need to resolve this issue before he is able to return. Patient states he is agreeable to work out an agreeable with SNF.  Contacted [name]- updated on discussion with patient- [name] states they are agreeable for the patient to return.  Per MD, patient may be ready for d/c over the weekend- However, SNF unable to admit over the weekend.  TOC will continue to follow and assist with discharge planning.  Antony Blackbird, MSW, LCSW Clinical Social Worker    Expected Discharge Plan: Skilled Nursing Facility Barriers to Discharge: Continued Medical Work up  Expected Discharge Plan and Services   Discharge Planning Services: CM Consult Post Acute Care Choice: Skilled Nursing Facility Living arrangements for the past 2 months: Skilled Nursing Facility                                       Social Determinants of Health (SDOH) Interventions SDOH Screenings   Food Insecurity: No Food Insecurity (04/02/2023)  Housing: Low Risk  (04/02/2023)  Transportation Needs: No Transportation Needs (04/02/2023)  Utilities: Not At Risk (04/02/2023)  Tobacco Use: Low Risk  (04/10/2023)    Readmission Risk Interventions    10/24/2022   11:21 AM 09/02/2022   10:36 AM  Readmission Risk Prevention Plan  Transportation Screening Complete Complete  Medication Review Oceanographer) Complete Complete  PCP or Specialist appointment within 3-5 days of discharge Complete Complete  HRI or Home Care Consult Complete Complete  SW Recovery Care/Counseling Consult Complete Complete  Palliative Care Screening Not Applicable Not Applicable   Skilled Nursing Facility Complete Complete

## 2023-04-11 NOTE — Anesthesia Postprocedure Evaluation (Signed)
Anesthesia Post Note  Patient: Peter Yang  Procedure(s) Performed: Lambert Mody AND DEBRIDEMENT WITH WOUND CLOSURE     Patient location during evaluation: PACU Anesthesia Type: General Level of consciousness: awake and alert Pain management: pain level controlled Vital Signs Assessment: post-procedure vital signs reviewed and stable Respiratory status: spontaneous breathing, nonlabored ventilation, respiratory function stable and patient connected to nasal cannula oxygen Cardiovascular status: blood pressure returned to baseline and stable Postop Assessment: no apparent nausea or vomiting Anesthetic complications: no   There were no known notable events for this encounter.  Last Vitals:  Vitals:   04/11/23 0300 04/11/23 0546  BP:  (!) 88/64  Pulse: (!) 46 78  Resp: 12 14  Temp:  36.4 C  SpO2: (!) 73% 99%    Last Pain:  Vitals:   04/11/23 0546  TempSrc: Oral  PainSc:                  Holman Bonsignore S

## 2023-04-11 NOTE — Progress Notes (Signed)
PT Cancellation Note  Patient Details Name: Peter Yang MRN: [id number] DOB: Feb 20, 1956   Cancelled Treatment:    Reason Eval/Treat Not Completed: (P) Pain limiting ability to participate (pt defers PT session due to pain. Will not have time to return as schedule will not allow therapist to stay later in day.) Will continue efforts per PT plan of care as schedule permits.   Dorathy Kinsman Josh Nicolosi 04/11/2023, 6:03 PM

## 2023-04-11 NOTE — Progress Notes (Signed)
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  Patient had acute urinary retention on Saturday, June 8.  He had a Foley catheter placed at that time.     CT scan showed findings c/w fournier's gangrene.  Urology was consulted.  He's post op from I&D on 6/12.  Plan to go back to OR 6/19 for wound closure.   Cardiology has been consulted with concern for heart failure/volume overload, but they noted his poor cardiovascular prognosis and did not suspect he was in decompensated heart failure.     At this time, on antibiotics per ID (plan for 4 weeks after last surgical procedure).   He's got significant edema and I'm trialing lasix with parameters given the significant edema around his wound.    See below for additional details.    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 Volume overloaded on exam with dependent edema on bilateral thighs. CXR with increased density in both lower lung fields, bilateral effusions, possible atelectasis/pneumonia (clinically not c/w pneumonia) Strict I/O, daily weights Cardiology notes limited cardiovascular options, but did not think he was in decompensated HF. Note poor cardiovascular prognosis.  See 6/16 note.  Palliative care consulted.  Lasix was on hold with prior cards recs and hypotension however keeping in mind about his dependent edema, he was resumed on Lasix 40 mg IV twice daily, he had decent diuresis, and I will continue that since blood pressure is although low but fairly stable for him.  Hypotension Relatively chronic it sounds.  He remains on stress dose steroids.  Was on 10 mg Solu-Cortef in the morning and 5 at night.  I reduced 5 twice daily starting 04/09/2023.  Blood pressure very low, increase to his midodrine to 10 mg 3 times daily yesterday.  Checked his lactic acid today which is normal.   Hypervolemic Hyponatremia Remains grossly overloaded on exam.  Sodium stable around 132.  Continue diuresis.  Monitor closely.   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  No evidence of osteo.  Diffuse myositis throughout pelvic and thigh musculature.  Underwent wound washout and closure with exchange of the Foley catheter by Dr. Marlou Porch 04/09/2023.  No further surgical plans by urology.  PICC  line placed 04/10/2023.  ID discontinued Merrem and linezolid and started on ertapenem and daptomycin on 04/10/2023 with the plan to continue both antibiotics for 6 weeks with EOT on 05/20/2023. 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   Gross hematuria Due to foley? This also seems to be known.  Hematuria resolved.  Resumed Eliquis on 04/10/2023 after clearance from urology.  Urine culture with no growth.   Type 2 diabetes mellitus with diabetic peripheral angiopathy without gangrene, without long-term current use of insulin (HCC) Continue with SSI and 20 units of lantus.  Blood sugar controlled.   History of urinary retention Foley catheter placed on Saturday 03-29-2023.  Previous notes suggest that he is supposed to be on Flomax but he is not.  Started him on Flomax, he underwent Foley exchange 04/09/2023.  PAF (paroxysmal atrial fibrillation) (HCC) Stable. He is not on any rate control medications.   Essential hypertension Stable. Has hx of orthostatic hypotension.   Long term current use of systemic steroids Pt remains on cortef bid. Unclear why he is still on cortef.  Appears to have started in October/November 2023. I think these were started due to his chronic hypotension.  I have started to wean 04/09/2023.  Would be worth an endocrine referral if he's able to make it vs a slow gradual wean of his steroids he's been on since last year.   Prostate Cancer Follow outpatient with urology  DVT prophylaxis: SCDs Start: 04/02/23 0044   Code Status: DNR  Family Communication:  None present at bedside.  Plan of care discussed with patient in length and he/she verbalized understanding and  agreed with it.  Status is: Inpatient Remains inpatient appropriate because: Patient is now medically stable, he came from [facility], per [facility] note from yesterday, due to pending bills, facility is not willing to accept him back.  TOC is working on finding a place for him.   Estimated body mass index is 31.34 kg/m as calculated from the following:   Height as of this encounter: 6\' 4"  (1.93 m).   Weight as of this encounter: 116.8 kg.  Pressure Injury 08/03/22 Buttocks Mid Stage 2 -  Partial thickness loss of dermis presenting as a shallow open injury with a red, pink wound bed without slough. area of red blanchable and non blanchable, and areas with skin sloughed off (Active)  08/03/22 1513  Location: Buttocks  Location Orientation: Mid  Staging: Stage 2 -  Partial thickness loss of dermis presenting as a shallow open injury with a red, pink wound bed without slough.  Wound Description (Comments): area of red blanchable and non blanchable, and areas with skin sloughed off  Present on Admission: Yes  Dressing Type Foam - Lift dressing to assess site every shift 04/10/23 1948   Nutritional Assessment: Body mass index is 31.34 kg/m.[name] Seen by dietician.  I agree with the assessment and plan as outlined below:  Nutrition Status: Nutrition Problem: Increased nutrient needs Etiology: wound healing Signs/Symptoms: estimated needs Interventions: Premier Protein, Juven  . Skin Assessment: I have examined the patient's skin and I agree with the wound assessment as performed by the wound care RN as outlined below: Pressure Injury 08/03/22 Buttocks Mid Stage 2 -  Partial thickness loss of dermis presenting as a shallow open injury with a red, pink wound bed without slough. area of red blanchable and non blanchable, and areas with skin sloughed off (Active)  08/03/22 1513  Location: Buttocks  Location Orientation: Mid  Staging: Stage 2 -  Partial thickness loss of dermis presenting as a  shallow open injury with a red, pink wound bed without slough.  Wound Description (Comments): area of red blanchable and non blanchable, and areas with skin sloughed off  Present on Admission: Yes  Dressing Type Foam - Lift dressing to assess site every shift 04/10/23 1948    Consultants:  Urology and ID  Procedures:  As above  Antimicrobials:  Anti-infectives (From admission, onward)    Start     Dose/Rate Route Frequency Ordered Stop   04/10/23 1400  DAPTOmycin (CUBICIN) 800 mg in sodium chloride 0.9 % IVPB        8 mg/kg  98.8 kg (Adjusted) 132 mL/hr over 30 Minutes Intravenous Daily 04/10/23 1206 05/20/23 2359   04/10/23 1400  ertapenem (INVANZ) 1,000 mg in sodium chloride 0.9 % 100 mL IVPB        1 g 200 mL/hr over 30 Minutes Intravenous Every 24 hours 04/10/23 1206 05/20/23 2359   04/04/23 1000  linezolid (ZYVOX) IVPB 600 mg  Status:  Discontinued        600 mg 300 mL/hr over 60 Minutes Intravenous Every 12 hours 04/04/23 0902 04/10/23 1205   04/04/23 0200  vancomycin (VANCOCIN) IVPB 1000 mg/200 mL premix  Status:  Discontinued        1,000 mg 200 mL/hr over 60 Minutes Intravenous Every 12 hours 04/03/23 1936 04/04/23 0902   04/03/23 1400  vancomycin (VANCOREADY) IVPB 1500 mg/300 mL  Status:  Discontinued        1,500 mg 150 mL/hr over 120 Minutes Intravenous Every 12 hours 04/03/23 1302 04/03/23 1936   04/02/23 1400  vancomycin (VANCOCIN) IVPB 1000 mg/200 mL premix  Status:  Discontinued        1,000 mg 200 mL/hr over 60 Minutes Intravenous Every 12 hours 04/02/23 0036 04/03/23 1302   04/02/23 0115  vancomycin (VANCOREADY) IVPB 2000 mg/400 mL        2,000 mg 200 mL/hr over 120 Minutes Intravenous  Once 04/02/23 0015 04/02/23 0529   04/02/23 0100  clindamycin (CLEOCIN) IVPB 900 mg  Status:  Discontinued        900 mg 100 mL/hr over 30 Minutes Intravenous Every 8 hours 04/02/23 0002 04/02/23 0005   04/02/23 0100  clindamycin (CLEOCIN) IVPB 900 mg  Status:  Discontinued         900 mg 100 mL/hr over 30 Minutes Intravenous Every 8 hours 04/02/23 0012 04/04/23 0902   04/02/23 0000  meropenem (MERREM) 1 g in sodium chloride 0.9 % 100 mL IVPB  Status:  Discontinued        1 g 200 mL/hr over 30 Minutes Intravenous Every 8 hours 04/01/23 2352 04/10/23 1205   04/01/23 2215  meropenem (MERREM) 2 g in sodium chloride 0.9 % 100 mL IVPB  Status:  Discontinued        2 g 280 mL/hr over 30  Minutes Intravenous  Once 04/01/23 2221 04/01/23 2352   04/01/23 1800  cefTRIAXone (ROCEPHIN) 1 g in sodium chloride 0.9 % 100 mL IVPB        1 g 200 mL/hr over 30 Minutes Intravenous  Once 04/01/23 1746 04/01/23 1902         Subjective:  Patient seen and examined.  Continues to complain of groin pain but it is improving.  He is on oxycodone 5 mg every 4 hours as needed.  Will start him on oxycodone ER 10 mg p.o. twice daily.  Objective: Vitals:   04/11/23 0133 04/11/23 0300 04/11/23 0546 04/11/23 0843  BP:   (!) 88/64 99/68  Pulse:  (!) 46 78 81  Resp: 13 12 14 17   Temp:   97.6 F (36.4 C) (!) 96.7 F (35.9 C)  TempSrc:   Oral Axillary  SpO2: 94% (!) 73% 99% 93%  Weight:      Height:        Intake/Output Summary (Last 24 hours) at 04/11/2023 1052 Last data filed at 04/11/2023 0435 Gross per 24 hour  Intake 616.67 ml  Output 1300 ml  Net -683.33 ml    Filed Weights   04/08/23 0712  Weight: 116.8 kg    Examination:  General exam: Appears calm and comfortable  Respiratory system: Clear to auscultation. Respiratory effort normal. Cardiovascular system: S1 & S2 heard, RRR. No JVD, murmurs, rubs, gallops or clicks. No pedal edema. Gastrointestinal system: Abdomen is nondistended, soft and nontender. No organomegaly or masses felt. Normal bowel sounds heard.  Dressing in the groin area Central nervous system: Always sleepy at his baseline but oriented and responds to questions appropriately. No focal neurological deficits. Extremities: Symmetric 5 x 5 power. Skin:  No rashes, lesions or ulcers.    Data Reviewed: I have personally reviewed following labs and imaging studies  CBC: Recent Labs  Lab 04/06/23 0122 04/07/23 0135 04/08/23 0743 04/09/23 0053 04/10/23 1955 04/11/23 0155  WBC 12.8* 11.1* 11.6* 15.3* 9.6 8.7  NEUTROABS 9.2* 8.8*  --  12.2* 7.4 6.8  HGB 9.7* 9.3* 10.1* 10.1* 9.9* 10.0*  HCT 31.5* 30.4* 32.7* 33.4* 32.2* 32.3*  MCV 78.9* 77.4* 78.6* 76.8* 78.7* 76.4*  PLT 357 321 301 323 215 222    Basic Metabolic Panel: Recent Labs  Lab 04/05/23 0106 04/06/23 0122 04/07/23 0135 04/08/23 0743 04/09/23 0053 04/10/23 1955 04/11/23 0155  NA 127* 126* 129* 130* 130* 130* 132*  K 3.8 4.2 3.9 4.2 3.9 3.8 3.9  CL 97* 96* 97* 100 98 97* 94*  CO2 19* 21* 22 22 22 26 26   GLUCOSE 292* 188* 248* 150* 188* 208* 184*  BUN 50* 61* 60* 52* 48* 37* 37*  CREATININE 1.03 1.09 1.01 0.84 0.81 0.89 0.82  CALCIUM 8.2* 8.3* 8.3* 8.2* 8.1* 7.9* 8.3*  MG 2.0 2.1 2.1  --  1.8  --   --   PHOS 3.2 3.1 2.7  --  2.6  --   --     GFR: Estimated Creatinine Clearance: 123.8 mL/min (by C-G formula based on SCr of 0.82 mg/dL). Liver Function Tests: Recent Labs  Lab 04/05/23 0106 04/06/23 0122 04/07/23 0135 04/09/23 0053  AST 17 20 16 23   ALT 16 16 16 15   ALKPHOS 65 60 57 60  BILITOT 0.7 0.7 0.7 1.0  PROT 5.3* 5.6* 5.3* 5.1*  ALBUMIN 2.2* 2.5* 2.4* 2.2*    No results for input(s): "LIPASE", "AMYLASE" in the last 168 hours. No results for input(s): "AMMONIA"  in the last 168 hours. Coagulation Profile: No results for input(s): "INR", "PROTIME" in the last 168 hours. Cardiac Enzymes: Recent Labs  Lab 04/10/23 1955  CKTOTAL 24*   BNP (last 3 results) No results for input(s): "PROBNP" in the last 8760 hours. HbA1C: No results for input(s): "HGBA1C" in the last 72 hours. CBG: Recent Labs  Lab 04/10/23 1141 04/10/23 1601 04/10/23 2102 04/11/23 0549 04/11/23 0855  GLUCAP 233* 187* 179* 161* 116*    Lipid Profile: No results for  input(s): "CHOL", "HDL", "LDLCALC", "TRIG", "CHOLHDL", "LDLDIRECT" in the last 72 hours. Thyroid Function Tests: No results for input(s): "TSH", "T4TOTAL", "FREET4", "T3FREE", "THYROIDAB" in the last 72 hours. Anemia Panel: Recent Labs    04/11/23 0155  VITAMINB12 824   Sepsis Labs: Recent Labs  Lab 04/11/23 8295  LATICACIDVEN 1.6     Recent Results (from the past 240 hour(s))  Urine Culture     Status: None   Collection Time: 04/01/23  5:46 PM   Specimen: Urine, Catheterized  Result Value Ref Range Status   Specimen Description URINE, CATHETERIZED  Final   Special Requests NONE  Final   Culture   Final    NO GROWTH Performed at [facility], [address] [place], [place] [address]    Report Status 04/02/2023 FINAL  Final  MRSA Next Gen by PCR, Nasal     Status: Abnormal   Collection Time: 04/02/23  1:00 AM   Specimen: Nasal Mucosa; Nasal Swab  Result Value Ref Range Status   MRSA by PCR Next Gen DETECTED (A) NOT DETECTED Final    Comment: RESULT CALLED TO, READ BACK BY AND VERIFIED WITH: RN KAITLYN BLUCHER ON 04/02/23 @ 1515 BY DRT (NOTE) The GeneXpert MRSA Assay (FDA approved for NASAL specimens only), is one component of a comprehensive MRSA colonization surveillance program. It is not intended to diagnose MRSA infection nor to guide or monitor treatment for MRSA infections. Test performance is not FDA approved in patients less than 25 years old. Performed at [facility], [address], [place], [place] [address]      Radiology Studies: [place] EKG SITE RITE  Result Date: 04/10/2023 If [facility] image not attached, placement could not be confirmed due to current cardiac rhythm.   Scheduled Meds:  acetaminophen  1,000 mg Oral Q8H   apixaban  5 mg Oral BID   ascorbic acid  500 mg Oral BID   atorvastatin  80 mg Oral Daily   Chlorhexidine Gluconate Cloth  6 each Topical Daily   feeding supplement  237 mL Oral TID BM   furosemide  40 mg Intravenous  BID   gabapentin  300 mg Oral QHS    HYDROmorphone (DILAUDID) injection  0.5 mg Intravenous Once   insulin aspart  0-15 Units Subcutaneous TID WC   insulin aspart  0-5 Units Subcutaneous QHS   insulin glargine-yfgn  20 Units Subcutaneous QHS   midodrine  10 mg Oral TID with meals   nutrition supplement (JUVEN)  1 packet Oral BID BM   pantoprazole  40 mg Oral Daily   polyethylene glycol  17 g Oral BID   sodium chloride flush  10-40 mL Intracatheter Q12H   tamsulosin  0.4 mg Oral QPC supper   zinc sulfate  220 mg Oral Daily   Continuous Infusions:  sodium chloride 10 mL/hr at 04/10/23 2322   DAPTOmycin (CUBICIN) 800 mg in sodium chloride 0.9 % IVPB Stopped (04/10/23 1819)   ertapenem 1,000  mg (04/10/23 1602)     LOS: 9 days   Hughie Closs, MD [facility]  04/11/2023, 10:52 AM   *Please note that this is a verbal dictation therefore any spelling or grammatical errors are due to the "Dragon Medical One" system interpretation.  Please page via Amion and do not message via secure chat for urgent patient care matters. Secure chat can be used for non urgent patient care matters.  How to contact the [facility] Attending or Consulting provider 7A - 7P or covering provider during after hours 7P -7A, for this patient?  Check the care team in [facility] and look for a) attending/consulting [facility] provider listed and b) the [facility] team listed. Page or secure chat 7A-7P. Log into [email or web address] and use [place]'s universal password to access. If you do not have the password, please contact the hospital operator. Locate the [facility] provider you are looking for under [facility] and page to a number that you can be directly reached. If you still have difficulty reaching the provider, please page the [facility] (Director on Call) for the Hospitalists listed on amion for assistance.

## 2023-04-12 DIAGNOSIS — Z7952 Long term (current) use of systemic steroids: Secondary | ICD-10-CM

## 2023-04-12 LAB — IRON AND TIBC
Iron: 50 ug/dL (ref 45–182)
Saturation Ratios: 22 % (ref 17.9–39.5)
TIBC: 230 ug/dL — ABNORMAL LOW (ref 250–450)
UIBC: 180 ug/dL

## 2023-04-12 LAB — BASIC METABOLIC PANEL
Anion gap: 8 (ref 5–15)
BUN: 34 mg/dL — ABNORMAL HIGH (ref 8–23)
CO2: 29 mmol/L (ref 22–32)
Calcium: 7.9 mg/dL — ABNORMAL LOW (ref 8.9–10.3)
Chloride: 95 mmol/L — ABNORMAL LOW (ref 98–111)
Creatinine, Ser: 0.83 mg/dL (ref 0.61–1.24)
GFR, Estimated: >60 mL/min (ref >60)
Glucose, Bld: 118 mg/dL — ABNORMAL HIGH (ref 70–99)
Potassium: 3.8 mmol/L (ref 3.5–5.1)
Sodium: 132 mmol/L — ABNORMAL LOW (ref 135–145)

## 2023-04-12 LAB — CBC
HCT: 31.3 % — ABNORMAL LOW (ref 39.0–52.0)
Hemoglobin: 9.7 g/dL — ABNORMAL LOW (ref 13.0–17.0)
MCH: 24.3 pg — ABNORMAL LOW (ref 26.0–34.0)
MCHC: 31 g/dL (ref 30.0–36.0)
MCV: 78.3 fL — ABNORMAL LOW (ref 80.0–100.0)
Platelets: 171 10*3/uL (ref 150–400)
RBC: 4 MIL/uL — ABNORMAL LOW (ref 4.22–5.81)
RDW: 19.6 % — ABNORMAL HIGH (ref 11.5–15.5)
WBC: 9.5 10*3/uL (ref 4.0–10.5)
nRBC: 0 % (ref 0.0–0.2)

## 2023-04-12 LAB — GLUCOSE, CAPILLARY
Glucose-Capillary: 121 mg/dL — ABNORMAL HIGH (ref 70–99)
Glucose-Capillary: 110 mg/dL — ABNORMAL HIGH (ref 70–99)
Glucose-Capillary: 157 mg/dL — ABNORMAL HIGH (ref 70–99)
Glucose-Capillary: 166 mg/dL — ABNORMAL HIGH (ref 70–99)

## 2023-04-12 LAB — FERRITIN: Ferritin: 54 ng/mL (ref 24–336)

## 2023-04-12 LAB — MAGNESIUM: Magnesium: 1.7 mg/dL (ref 1.7–2.4)

## 2023-04-12 MED ORDER — MIDODRINE HCL 5 MG PO TABS: 5.0000 mg | ORAL_TABLET | Freq: Three times a day (TID) | ORAL | Status: DC

## 2023-04-12 MED ORDER — OXYCODONE HCL 5 MG PO TABS
5.0000 mg | ORAL_TABLET | Freq: Once | ORAL | Status: AC
  Administered 2023-04-12: 5 mg via ORAL
  Filled 2023-04-12: qty 1

## 2023-04-12 MED ORDER — MIDODRINE HCL 5 MG PO TABS
10.0000 mg | ORAL_TABLET | Freq: Three times a day (TID) | ORAL | Status: DC
  Administered 2023-04-12 – 2023-04-14 (×7): 10 mg via ORAL
  Filled 2023-04-12 (×7): qty 2

## 2023-04-12 MED ORDER — HYDROCORTISONE 5 MG PO TABS
5.0000 mg | ORAL_TABLET | Freq: Two times a day (BID) | ORAL | Status: DC
  Administered 2023-04-12 – 2023-04-18 (×12): 5 mg via ORAL
  Filled 2023-04-12 (×14): qty 1

## 2023-04-12 NOTE — Progress Notes (Signed)
PROGRESS NOTE    Peter Yang  OZH:[id number] DOB: 1956-02-14 DOA: 04/01/2023 PCP: Annita Brod, MD   Brief Narrative:  67 year old male with a history of left below-knee amputation, chronic systolic heart failure, history of A-fib, recent admission in April 2024 at [facility] for right diabetic foot ulcer status post fifth ray amputation, hypertension, type 2 diabetes who presents to the ER today from [facility] due to pus and blood from his Foley catheter.  Patient had acute urinary retention on Saturday, June 8.  He had a Foley catheter placed at that time.     CT scan showed findings c/w fournier's gangrene.  Urology was consulted.  He's post op from I&D on 6/12.  Plan to go back to OR 6/19 for wound closure.   Cardiology has been consulted with concern for heart failure/volume overload, but they noted his poor cardiovascular prognosis and did not suspect he was in decompensated heart failure.     At this time, on antibiotics per ID (plan for 4 weeks after last surgical procedure).   He's got significant edema and I'm trialing lasix with parameters given the significant edema around his wound.    See below for additional details.  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 Volume overloaded on exam with dependent edema on bilateral thighs. Albumin is 2.2. CXR with increased density in both lower lung fields, bilateral effusions, possible atelectasis/pneumonia (clinically not c/w pneumonia) Strict I/O, daily weights Cardiology notes limited cardiovascular options, but did not think he was in decompensated HF. Note poor cardiovascular prognosis.  See 6/16 note.  Palliative care consulted.  Lasix was on hold with prior cards recs and hypotension however keeping in mind about his dependent edema, he was resumed on Lasix 40 mg IV twice daily, he had decent diuresis, and I will continue that since blood pressure is although low but fairly stable for him.   Hypotension, asymptomatic  Long term current use of systemic steroids Unclear why he is still on cortef on admission.  Was on 10 mg Solu-Cortef in the morning and 5 at night. Appears to have started in October/November 2023.I think these were started due to his chronic hypotension. was started to wean 04/09/2023 and was stopped 6/20; however, given how long he has been on this medication will continue at least 5 mg BID and then taper.  Blood pressure was low, midodrine was increased from 5 mg to 10 mg TID. Ideally he would not be on this medication as he has no notable symptoms, and no evidence of disruption of end organ perfusion.  -on cortef  5 mg BID, taper over weeks  -wean midodrine   Hypervolemic Hyponatremia Remains grossly overloaded on exam.  Sodium stable around 132.  Continue diuresis.  Monitor closely.   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  No evidence of osteo.  Diffuse myositis throughout pelvic and thigh musculature.  Underwent wound washout and closure with exchange of the Foley catheter by Dr. Marlou Porch 04/09/2023.  No further surgical plans by urology.  PICC line placed 04/10/2023.  ID discontinued Merrem and linezolid and started on ertapenem and daptomycin on 04/10/2023 with the plan to continue both antibiotics for 6 weeks with EOT on 05/20/2023. 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   Gross hematuria Due to foley? This also seems to be known.  Hematuria resolved.  Resumed Eliquis on 04/10/2023 after clearance from urology.  Urine culture with no growth.   Type 2 diabetes mellitus with diabetic peripheral angiopathy without gangrene, without long-term current use of insulin (HCC) Continue with SSI and 20 units of lantus.  Blood sugar controlled.   History of urinary retention Foley catheter placed on Saturday 03-29-2023.  Previous notes suggest that he is supposed to be on Flomax but he is not.  Started him on Flomax, he underwent Foley exchange 04/09/2023.  PAF (paroxysmal atrial fibrillation) (HCC) Stable. He is not on any rate control medications. He is on anticoagulation with eliquis.    Essential hypertension Stable. Has hx of orthostatic hypotension.  Prostate Cancer Follow outpatient with urology  DVT prophylaxis: SCDs Start: 04/02/23 0044   Code Status: DNR  Family Communication:  None present at bedside.  Plan of care discussed with patient in length and  he/she verbalized understanding and agreed with it.  Status is: Inpatient Remains inpatient appropriate because: Patient is now medically stable, he came from [facility], per [facility] note from yesterday, due to pending bills, facility is not willing to accept him back.  TOC is working on finding a place for him.   Estimated body mass index is 31.34 kg/m as calculated from the following:   Height as of this encounter: 6\' 4"  (1.93 m).   Weight as of this encounter: 116.8 kg.  Pressure Injury 08/03/22 Buttocks Mid Stage 2 -  Partial thickness loss of dermis presenting as a shallow open injury with a red, pink wound bed without slough. area of red blanchable and non blanchable, and areas with skin sloughed off (Active)  08/03/22 1513  Location: Buttocks  Location Orientation: Mid  Staging: Stage 2 -  Partial thickness loss of dermis presenting as a shallow open injury with a red, pink wound bed without slough.  Wound Description (Comments): area of red blanchable and non blanchable, and areas with skin sloughed off  Present on Admission: Yes  Dressing Type Foam - Lift dressing to assess site every shift 04/11/23 2040   Nutritional Assessment: Body mass index is 31.34 kg/m.[name] Seen by dietician.  I agree with the assessment and  plan as outlined below: Nutrition Status: Nutrition Problem: Increased nutrient needs Etiology: wound healing Signs/Symptoms: estimated needs Interventions: Premier Protein, Juven  . Skin Assessment: I have examined the patient's skin and I agree with the wound assessment as performed by the wound care RN as outlined below: Pressure Injury 08/03/22 Buttocks Mid Stage 2 -  Partial thickness loss of dermis presenting as a shallow open injury with a red, pink wound bed without slough. area of red blanchable and non blanchable, and areas with skin sloughed off (Active)  08/03/22 1513  Location: Buttocks  Location Orientation: Mid  Staging: Stage 2 -  Partial  thickness loss of dermis presenting as a shallow open injury with a red, pink wound bed without slough.  Wound Description (Comments): area of red blanchable and non blanchable, and areas with skin sloughed off  Present on Admission: Yes  Dressing Type Foam - Lift dressing to assess site every shift 04/11/23 2040    Consultants:  Urology and ID  Procedures:  As above  Antimicrobials:  Anti-infectives (From admission, onward)    Start     Dose/Rate Route Frequency Ordered Stop   04/10/23 1400  DAPTOmycin (CUBICIN) 800 mg in sodium chloride 0.9 % IVPB        8 mg/kg  98.8 kg (Adjusted) 132 mL/hr over 30 Minutes Intravenous Daily 04/10/23 1206 05/20/23 2359   04/10/23 1400  ertapenem (INVANZ) 1,000 mg in sodium chloride 0.9 % 100 mL IVPB        1 g 200 mL/hr over 30 Minutes Intravenous Every 24 hours 04/10/23 1206 05/20/23 2359   04/04/23 1000  linezolid (ZYVOX) IVPB 600 mg  Status:  Discontinued        600 mg 300 mL/hr over 60 Minutes Intravenous Every 12 hours 04/04/23 0902 04/10/23 1205   04/04/23 0200  vancomycin (VANCOCIN) IVPB 1000 mg/200 mL premix  Status:  Discontinued        1,000 mg 200 mL/hr over 60 Minutes Intravenous Every 12 hours 04/03/23 1936 04/04/23 0902   04/03/23 1400  vancomycin (VANCOREADY) IVPB 1500 mg/300 mL  Status:  Discontinued        1,500 mg 150 mL/hr over 120 Minutes Intravenous Every 12 hours 04/03/23 1302 04/03/23 1936   04/02/23 1400  vancomycin (VANCOCIN) IVPB 1000 mg/200 mL premix  Status:  Discontinued        1,000 mg 200 mL/hr over 60 Minutes Intravenous Every 12 hours 04/02/23 0036 04/03/23 1302   04/02/23 0115  vancomycin (VANCOREADY) IVPB 2000 mg/400 mL        2,000 mg 200 mL/hr over 120 Minutes Intravenous  Once 04/02/23 0015 04/02/23 0529   04/02/23 0100  clindamycin (CLEOCIN) IVPB 900 mg  Status:  Discontinued        900 mg 100 mL/hr over 30 Minutes Intravenous Every 8 hours 04/02/23 0002 04/02/23 0005   04/02/23 0100  clindamycin  (CLEOCIN) IVPB 900 mg  Status:  Discontinued        900 mg 100 mL/hr over 30 Minutes Intravenous Every 8 hours 04/02/23 0012 04/04/23 0902   04/02/23 0000  meropenem (MERREM) 1 g in sodium chloride 0.9 % 100 mL IVPB  Status:  Discontinued        1 g 200 mL/hr over 30 Minutes Intravenous Every 8 hours 04/01/23 2352 04/10/23 1205   04/01/23 2215  meropenem (MERREM) 2 g in sodium chloride 0.9 % 100 mL IVPB  Status:  Discontinued        2 g  280 mL/hr over 30 Minutes Intravenous  Once 04/01/23 2221 04/01/23 2352   04/01/23 1800  cefTRIAXone (ROCEPHIN) 1 g in sodium chloride 0.9 % 100 mL IVPB        1 g 200 mL/hr over 30 Minutes Intravenous  Once 04/01/23 1746 04/01/23 1902         Subjective:  Patient seen and examined.  Pain well controlled. Has not had a BM in 1 week, declined any medications for this.   Objective: Vitals:   04/11/23 2015 04/11/23 2313 04/12/23 0428 04/12/23 1200  BP: 97/67 96/70 91/66  106/69  Pulse: 78 74 73   Resp: 17 16 18    Temp: (!) 97.4 F (36.3 C) 97.9 F (36.6 C) 97.6 F (36.4 C)   TempSrc: Oral Oral Oral   SpO2: 100% 98% 100%   Weight:      Height:        Intake/Output Summary (Last 24 hours) at 04/12/2023 1500 Last data filed at 04/12/2023 1415 Gross per 24 hour  Intake 1851.16 ml  Output 1475 ml  Net 376.16 ml   Filed Weights   04/08/23 0712  Weight: 116.8 kg    Examination:  General exam: Appears calm and comfortable  Respiratory system: Clear to auscultation. Respiratory effort normal. Cardiovascular system: S1 & S2 heard, RRR. No JVD, murmurs, rubs, gallops or clicks. No pedal edema. Gastrointestinal system: Abdomen is nondistended, soft and nontender. No organomegaly or masses felt. Normal bowel sounds heard.  Dressing in the groin area Central nervous system: Always sleepy at his baseline but oriented and responds to questions appropriately. No focal neurological deficits. Extremities: Symmetric 5 x 5 power. Skin: No rashes, lesions  or ulcers.    Data Reviewed: I have personally reviewed following labs and imaging studies  CBC: Recent Labs  Lab 04/06/23 0122 04/07/23 0135 04/08/23 0743 04/09/23 0053 04/10/23 1955 04/11/23 0155 04/12/23 0929  WBC 12.8* 11.1* 11.6* 15.3* 9.6 8.7 9.5  NEUTROABS 9.2* 8.8*  --  12.2* 7.4 6.8  --   HGB 9.7* 9.3* 10.1* 10.1* 9.9* 10.0* 9.7*  HCT 31.5* 30.4* 32.7* 33.4* 32.2* 32.3* 31.3*  MCV 78.9* 77.4* 78.6* 76.8* 78.7* 76.4* 78.3*  PLT 357 321 301 323 215 222 171   Basic Metabolic Panel: Recent Labs  Lab 04/06/23 0122 04/07/23 0135 04/08/23 0743 04/09/23 0053 04/10/23 1955 04/11/23 0155 04/12/23 0929  NA 126* 129* 130* 130* 130* 132* 132*  K 4.2 3.9 4.2 3.9 3.8 3.9 3.8  CL 96* 97* 100 98 97* 94* 95*  CO2 21* 22 22 22 26 26 29   GLUCOSE 188* 248* 150* 188* 208* 184* 118*  BUN 61* 60* 52* 48* 37* 37* 34*  CREATININE 1.09 1.01 0.84 0.81 0.89 0.82 0.83  CALCIUM 8.3* 8.3* 8.2* 8.1* 7.9* 8.3* 7.9*  MG 2.1 2.1  --  1.8  --   --  1.7  PHOS 3.1 2.7  --  2.6  --   --   --    GFR: Estimated Creatinine Clearance: 122.3 mL/min (by C-G formula based on SCr of 0.83 mg/dL). Liver Function Tests: Recent Labs  Lab 04/06/23 0122 04/07/23 0135 04/09/23 0053  AST 20 16 23   ALT 16 16 15   ALKPHOS 60 57 60  BILITOT 0.7 0.7 1.0  PROT 5.6* 5.3* 5.1*  ALBUMIN 2.5* 2.4* 2.2*   No results for input(s): "LIPASE", "AMYLASE" in the last 168 hours. No results for input(s): "AMMONIA" in the last 168 hours. Coagulation Profile: No results for input(s): "INR", "PROTIME"  in the last 168 hours. Cardiac Enzymes: Recent Labs  Lab 04/10/23 1955  CKTOTAL 24*   BNP (last 3 results) No results for input(s): "PROBNP" in the last 8760 hours. HbA1C: No results for input(s): "HGBA1C" in the last 72 hours. CBG: Recent Labs  Lab 04/11/23 1219 04/11/23 1603 04/11/23 2113 04/12/23 0627 04/12/23 1202  GLUCAP 167* 201* 163* 121* 110*   Lipid Profile: No results for input(s): "CHOL",  "HDL", "LDLCALC", "TRIG", "CHOLHDL", "LDLDIRECT" in the last 72 hours. Thyroid Function Tests: No results for input(s): "TSH", "T4TOTAL", "FREET4", "T3FREE", "THYROIDAB" in the last 72 hours. Anemia Panel: Recent Labs    04/11/23 0155 04/12/23 0929  VITAMINB12 824  --   FERRITIN  --  54  TIBC  --  230*  IRON  --  50   Sepsis Labs: Recent Labs  Lab 04/11/23 0829  LATICACIDVEN 1.6    No results found for this or any previous visit (from the past 240 hour(s)).    Radiology Studies: No results found.  Scheduled Meds:  acetaminophen  1,000 mg Oral Q8H   apixaban  5 mg Oral BID   ascorbic acid  500 mg Oral BID   atorvastatin  80 mg Oral Daily   Chlorhexidine Gluconate Cloth  6 each Topical Daily   feeding supplement  237 mL Oral TID BM   furosemide  40 mg Intravenous BID   gabapentin  300 mg Oral QHS   insulin aspart  0-15 Units Subcutaneous TID WC   insulin aspart  0-5 Units Subcutaneous QHS   insulin glargine-yfgn  20 Units Subcutaneous QHS   midodrine  10 mg Oral TID with meals   nutrition supplement (JUVEN)  1 packet Oral BID BM   oxyCODONE  10 mg Oral Q12H   pantoprazole  40 mg Oral Daily   polyethylene glycol  17 g Oral BID   sodium chloride flush  10-40 mL Intracatheter Q12H   tamsulosin  0.4 mg Oral QPC supper   zinc sulfate  220 mg Oral Daily   Continuous Infusions:  sodium chloride 10 mL/hr at 04/11/23 1914   DAPTOmycin (CUBICIN) 800 mg in sodium chloride 0.9 % IVPB Stopped (04/11/23 1539)   ertapenem 1,000 mg (04/12/23 1430)     LOS: 10 days   Charolotte Eke, MD [facility]  04/12/2023, 3:00 PM   *Please note that this is a verbal dictation therefore any spelling or grammatical errors are due to the "Dragon Medical One" system interpretation.  Please page via Amion and do not message via secure chat for urgent patient care matters. Secure chat can be used for non urgent patient care matters.  How to contact the [facility] Attending or Consulting  provider 7A - 7P or covering provider during after hours 7P -7A, for this patient?  Check the care team in [facility] and look for a) attending/consulting [facility] provider listed and b) the [facility] team listed. Page or secure chat 7A-7P. Log into [email or web address] and use [facility] universal password to access. If you do not have the password, please contact the hospital operator. Locate the [facility] provider you are looking for under [facility] and page to a number that you can be directly reached. If you still have difficulty reaching the provider, please page the [facility] (Director on Call) for the Hospitalists listed on amion for assistance.

## 2023-04-13 LAB — GLUCOSE, CAPILLARY
Glucose-Capillary: 157 mg/dL — ABNORMAL HIGH (ref 70–99)
Glucose-Capillary: 178 mg/dL — ABNORMAL HIGH (ref 70–99)
Glucose-Capillary: 194 mg/dL — ABNORMAL HIGH (ref 70–99)
Glucose-Capillary: 173 mg/dL — ABNORMAL HIGH (ref 70–99)
Glucose-Capillary: 175 mg/dL — ABNORMAL HIGH (ref 70–99)

## 2023-04-13 LAB — COMPREHENSIVE METABOLIC PANEL
ALT: 20 U/L (ref 0–44)
AST: 27 U/L (ref 15–41)
Albumin: 2 g/dL — ABNORMAL LOW (ref 3.5–5.0)
Alkaline Phosphatase: 84 U/L (ref 38–126)
Anion gap: 9 (ref 5–15)
BUN: 32 mg/dL — ABNORMAL HIGH (ref 8–23)
CO2: 27 mmol/L (ref 22–32)
Calcium: 7.8 mg/dL — ABNORMAL LOW (ref 8.9–10.3)
Chloride: 95 mmol/L — ABNORMAL LOW (ref 98–111)
Creatinine, Ser: 0.8 mg/dL (ref 0.61–1.24)
GFR, Estimated: >60 mL/min (ref >60)
Glucose, Bld: 210 mg/dL — ABNORMAL HIGH (ref 70–99)
Potassium: 3.9 mmol/L (ref 3.5–5.1)
Sodium: 131 mmol/L — ABNORMAL LOW (ref 135–145)
Total Bilirubin: 0.5 mg/dL (ref 0.3–1.2)
Total Protein: 5 g/dL — ABNORMAL LOW (ref 6.5–8.1)

## 2023-04-13 LAB — CBC
HCT: 30.3 % — ABNORMAL LOW (ref 39.0–52.0)
Hemoglobin: 9.4 g/dL — ABNORMAL LOW (ref 13.0–17.0)
MCH: 24 pg — ABNORMAL LOW (ref 26.0–34.0)
MCHC: 31 g/dL (ref 30.0–36.0)
MCV: 77.3 fL — ABNORMAL LOW (ref 80.0–100.0)
Platelets: 148 10*3/uL — ABNORMAL LOW (ref 150–400)
RBC: 3.92 MIL/uL — ABNORMAL LOW (ref 4.22–5.81)
RDW: 19.8 % — ABNORMAL HIGH (ref 11.5–15.5)
WBC: 9.2 10*3/uL (ref 4.0–10.5)
nRBC: 0 % (ref 0.0–0.2)

## 2023-04-13 LAB — MAGNESIUM: Magnesium: 1.6 mg/dL — ABNORMAL LOW (ref 1.7–2.4)

## 2023-04-13 MED ORDER — FUROSEMIDE 10 MG/ML IJ SOLN
40.0000 mg | Freq: Every day | INTRAMUSCULAR | Status: DC
  Administered 2023-04-14: 40 mg via INTRAVENOUS
  Filled 2023-04-13: qty 4

## 2023-04-13 MED ORDER — MAGNESIUM SULFATE 4 GM/100ML IV SOLN
4.0000 g | Freq: Once | INTRAVENOUS | Status: AC
  Administered 2023-04-13: 4 g via INTRAVENOUS
  Filled 2023-04-13: qty 100

## 2023-04-13 NOTE — Progress Notes (Signed)
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  Patient had acute urinary retention on Saturday, June 8.  He had a Foley catheter placed at that time.     CT scan showed findings c/w fournier's gangrene.  Urology was consulted.  He's post op from I&D on 6/12.  Returned to OR 04/09/23 for wound closure.   Cardiology has been consulted with concern for heart failure/volume overload, but they noted his poor cardiovascular prognosis and did not suspect he was in decompensated heart failure.     At this time, on antibiotics per ID (plan for 4 weeks after last surgical procedure).   He's got significant edema and Dr. Allena Katz, [facility], initiated trialing lasix with parameters given the significant edema around his wound.    See below for additional details.  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 Volume overloaded on exam with dependent edema on bilateral thighs. Albumin is 2.2. CXR with increased density in both lower lung fields, bilateral effusions, possible atelectasis/pneumonia (clinically not c/w pneumonia) Continue strict I/O, daily weights Cardiology notes limited cardiovascular options, but did not think he was in decompensated HF. Note poor cardiovascular prognosis.  See 6/16 note.  Palliative care consulted.  Lasix was on hold with prior cards recs and hypotension however keeping in mind about his dependent edema, he was resumed on Lasix 40 mg IV twice daily, he had decent diuresis, and I will continue that since blood pressure is although low but fairly stable for him.   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  No evidence of osteo.  Diffuse myositis throughout pelvic and thigh musculature.  Underwent wound washout and closure with exchange of the Foley catheter by Dr. Marlou Porch 04/09/2023.  No further surgical plans by urology.  PICC line placed 04/10/2023.  ID discontinued Merrem and linezolid and started on ertapenem and daptomycin on 04/10/2023 with the plan to continue  both antibiotics for 6 weeks with EOT on 05/20/2023. Stop SGLT2 at discharge, added to "allergies/contraindications" list Urology following, appreciate assistance. The patient is on Eliquis for paroxysmal A-fib, continue to closely monitor  H&H.  Hypotension, asymptomatic  Long term current use of systemic steroids Unclear why he is still on cortef on admission.  Was on 10 mg Solu-Cortef in the morning and 5 at night. Appears to have started in October/November 2023.I think these were started due to his chronic hypotension. was started to wean 04/09/2023 and was stopped 6/20; however, given how long he has been on this medication will continue at least 5 mg BID and then taper.  Blood pressure was low, midodrine was increased from 5 mg to 10 mg TID. Ideally he would not be on this medication as he has no notable symptoms, and no evidence of disruption of end organ perfusion.  -on cortef 5 mg BID, taper over weeks  Currently on midodrine 10 mg 3 times daily  Hypervolemic Hyponatremia Remains grossly overloaded on exam.  Sodium stable around 132.  Continue diuresis.  Monitor closely. Will decrease IV Lasix from 40 mg twice daily to daily to avoid hypotension.  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   Type 2 diabetes mellitus with diabetic peripheral angiopathy without gangrene, without long-term current use of insulin (HCC) Continue with SSI and 20 units of lantus.  Blood sugar controlled.   History of urinary retention Foley catheter placed on Saturday 03-29-2023.  Previous notes suggest that he is supposed to be on Flomax but he is not.  Started him on Flomax, he underwent Foley exchange 04/09/2023.  PAF (paroxysmal atrial fibrillation) (HCC) Stable. He is not on any rate control medications. He is on anticoagulation with eliquis.  Closely monitor H&H with hematuria.   Essential hypertension Stable. Has hx of orthostatic  hypotension.  Prostate Cancer Follow outpatient with urology  DVT prophylaxis: SCDs Start: 04/02/23 0044 Home Eliquis.   Code Status: DNR  Family Communication:  None present at bedside.  Plan of care discussed with patient in length and he/she verbalized understanding and agreed with it.    Time: 50 minutes.  Status is: Inpatient Remains inpatient appropriate because: Patient is now medically stable, he came from [facility], per [facility] note from yesterday, due to pending bills, facility is not willing to accept him back.  TOC is working on finding a place for him.   Estimated body mass index is 30.19 kg/m as calculated from the following:   Height as of this encounter: 6\' 4"  (1.93 m).   Weight as of this encounter: 112.5 kg.  Pressure Injury 08/03/22 Buttocks Mid Stage 2 -  Partial thickness loss of dermis presenting as a shallow open injury with a red, pink wound bed without slough. area of red blanchable and non blanchable, and areas with skin sloughed off (Active)  08/03/22 1513  Location: Buttocks  Location Orientation: Mid  Staging: Stage 2 -  Partial thickness loss of dermis presenting as a shallow open injury with a red, pink wound bed without slough.  Wound Description (Comments): area of red blanchable and non blanchable, and areas with skin sloughed off  Present on Admission: Yes  Dressing Type Foam - Lift  dressing to assess site every shift (Simultaneous filing. User may not have seen previous data.) 04/12/23 1430   Nutritional Assessment: Body mass index is 30.19 kg/m.[name] Seen by dietician.  I agree with the assessment and plan as outlined below: Nutrition Status: Nutrition Problem: Increased nutrient needs Etiology: wound healing Signs/Symptoms: estimated needs Interventions: Premier Protein, Juven  . Skin Assessment: I have examined the patient's skin and I agree with the wound assessment as performed by the wound care RN as outlined below: Pressure Injury  08/03/22 Buttocks Mid Stage 2 -  Partial thickness loss of dermis presenting as a shallow open injury with a red, pink wound bed without slough. area of red blanchable and non blanchable, and areas with skin sloughed off (Active)  08/03/22 1513  Location: Buttocks  Location Orientation: Mid  Staging: Stage 2 -  Partial thickness loss of dermis presenting as a shallow open injury with a red, pink wound bed without slough.  Wound Description (Comments): area of red blanchable and non blanchable, and areas with skin sloughed off  Present on Admission: Yes  Dressing Type Foam - Lift dressing to assess site every shift (Simultaneous filing. User may not have seen previous data.) 04/12/23 1430    Consultants:  Urology and ID  Procedures:  As above  Antimicrobials:  Anti-infectives (From admission, onward)    Start     Dose/Rate Route Frequency Ordered Stop   04/10/23 1400  DAPTOmycin (CUBICIN) 800 mg in sodium chloride 0.9 % IVPB        8 mg/kg  98.8 kg (Adjusted) 132 mL/hr over 30 Minutes Intravenous Daily 04/10/23 1206 05/20/23 2359   04/10/23 1400  ertapenem (INVANZ) 1,000 mg in sodium chloride 0.9 % 100 mL IVPB        1 g 200 mL/hr over 30 Minutes Intravenous Every 24 hours 04/10/23 1206 05/20/23 2359   04/04/23 1000  linezolid (ZYVOX) IVPB 600 mg  Status:  Discontinued        600 mg 300 mL/hr over 60 Minutes Intravenous Every 12 hours 04/04/23 0902 04/10/23 1205   04/04/23 0200  vancomycin (VANCOCIN) IVPB 1000 mg/200 mL premix  Status:  Discontinued        1,000 mg 200 mL/hr over 60 Minutes Intravenous Every 12 hours 04/03/23 1936 04/04/23 0902   04/03/23 1400  vancomycin (VANCOREADY) IVPB 1500 mg/300 mL  Status:  Discontinued        1,500 mg 150 mL/hr over 120 Minutes Intravenous Every 12 hours 04/03/23 1302 04/03/23 1936   04/02/23 1400  vancomycin (VANCOCIN) IVPB 1000 mg/200 mL premix  Status:  Discontinued        1,000 mg 200 mL/hr over 60 Minutes Intravenous Every 12 hours  04/02/23 0036 04/03/23 1302   04/02/23 0115  vancomycin (VANCOREADY) IVPB 2000 mg/400 mL        2,000 mg 200 mL/hr over 120 Minutes Intravenous  Once 04/02/23 0015 04/02/23 0529   04/02/23 0100  clindamycin (CLEOCIN) IVPB 900 mg  Status:  Discontinued        900 mg 100 mL/hr over 30 Minutes Intravenous Every 8 hours 04/02/23 0002 04/02/23 0005   04/02/23 0100  clindamycin (CLEOCIN) IVPB 900 mg  Status:  Discontinued        900 mg 100 mL/hr over 30 Minutes Intravenous Every 8 hours 04/02/23 0012 04/04/23 0902   04/02/23 0000  meropenem (MERREM) 1 g in sodium chloride 0.9 % 100 mL IVPB  Status:  Discontinued  1 g 200 mL/hr over 30 Minutes Intravenous Every 8 hours 04/01/23 2352 04/10/23 1205   04/01/23 2215  meropenem (MERREM) 2 g in sodium chloride 0.9 % 100 mL IVPB  Status:  Discontinued        2 g 280 mL/hr over 30 Minutes Intravenous  Once 04/01/23 2221 04/01/23 2352   04/01/23 1800  cefTRIAXone (ROCEPHIN) 1 g in sodium chloride 0.9 % 100 mL IVPB        1 g 200 mL/hr over 30 Minutes Intravenous  Once 04/01/23 1746 04/01/23 1902           Objective: Vitals:   04/12/23 2329 04/13/23 0456 04/13/23 0737 04/13/23 1300  BP: (!) 86/57 (!) 103/55  109/84  Pulse:    99  Resp: 18   17  Temp: 97.9 F (36.6 C) 97.8 F (36.6 C)  97.8 F (36.6 C)  TempSrc: Oral Oral  Oral  SpO2:    98%  Weight:   112.5 kg   Height:        Intake/Output Summary (Last 24 hours) at 04/13/2023 1326 Last data filed at 04/13/2023 1304 Gross per 24 hour  Intake 883 ml  Output 5000 ml  Net -4117 ml   Filed Weights   04/12/23 1422 04/13/23 0737  Weight: 106.6 kg 112.5 kg    Examination:  General exam: Obese in no acute distress.  Alert and oriented x 3. Respiratory system: Clear to auscultation with no wheezes or rales. Cardiovascular system: Regular rate and rhythm no rubs or gallops. Gastrointestinal system: Abdomen is nondistended, soft and nontender. No organomegaly or masses felt.  Normal bowel sounds heard.  Dressing in the groin area Central nervous system: Alert and oriented.     Data Reviewed: I have personally reviewed following labs and imaging studies  CBC: Recent Labs  Lab 04/07/23 0135 04/08/23 0743 04/09/23 0053 04/10/23 1955 04/11/23 0155 04/12/23 0929 04/13/23 1100  WBC 11.1*   < > 15.3* 9.6 8.7 9.5 9.2  NEUTROABS 8.8*  --  12.2* 7.4 6.8  --   --   HGB 9.3*   < > 10.1* 9.9* 10.0* 9.7* 9.4*  HCT 30.4*   < > 33.4* 32.2* 32.3* 31.3* 30.3*  MCV 77.4*   < > 76.8* 78.7* 76.4* 78.3* 77.3*  PLT 321   < > 323 215 222 171 148*   < > = values in this interval not displayed.   Basic Metabolic Panel: Recent Labs  Lab 04/07/23 0135 04/08/23 0743 04/09/23 0053 04/10/23 1955 04/11/23 0155 04/12/23 0929 04/13/23 1100  NA 129*   < > 130* 130* 132* 132* 131*  K 3.9   < > 3.9 3.8 3.9 3.8 3.9  CL 97*   < > 98 97* 94* 95* 95*  CO2 22   < > 22 26 26 29 27   GLUCOSE 248*   < > 188* 208* 184* 118* 210*  BUN 60*   < > 48* 37* 37* 34* 32*  CREATININE 1.01   < > 0.81 0.89 0.82 0.83 0.80  CALCIUM 8.3*   < > 8.1* 7.9* 8.3* 7.9* 7.8*  MG 2.1  --  1.8  --   --  1.7 1.6*  PHOS 2.7  --  2.6  --   --   --   --    < > = values in this interval not displayed.   GFR: Estimated Creatinine Clearance: 124.7 mL/min (by C-G formula based on SCr of 0.8 mg/dL). Liver Function Tests: Recent  Labs  Lab 04/07/23 0135 04/09/23 0053 04/13/23 1100  AST 16 23 27   ALT 16 15 20   ALKPHOS 57 60 84  BILITOT 0.7 1.0 0.5  PROT 5.3* 5.1* 5.0*  ALBUMIN 2.4* 2.2* 2.0*   No results for input(s): "LIPASE", "AMYLASE" in the last 168 hours. No results for input(s): "AMMONIA" in the last 168 hours. Coagulation Profile: No results for input(s): "INR", "PROTIME" in the last 168 hours. Cardiac Enzymes: Recent Labs  Lab 04/10/23 1955  CKTOTAL 24*   BNP (last 3 results) No results for input(s): "PROBNP" in the last 8760 hours. HbA1C: No results for input(s): "HGBA1C" in the last 72  hours. CBG: Recent Labs  Lab 04/12/23 1648 04/12/23 2011 04/13/23 0624 04/13/23 0914 04/13/23 1304  GLUCAP 157* 166* 157* 178* 194*   Lipid Profile: No results for input(s): "CHOL", "HDL", "LDLCALC", "TRIG", "CHOLHDL", "LDLDIRECT" in the last 72 hours. Thyroid Function Tests: No results for input(s): "TSH", "T4TOTAL", "FREET4", "T3FREE", "THYROIDAB" in the last 72 hours. Anemia Panel: Recent Labs    04/11/23 0155 04/12/23 0929  VITAMINB12 824  --   FERRITIN  --  54  TIBC  --  230*  IRON  --  50   Sepsis Labs: Recent Labs  Lab 04/11/23 0829  LATICACIDVEN 1.6    No results found for this or any previous visit (from the past 240 hour(s)).    Radiology Studies: No results found.  Scheduled Meds:  acetaminophen  1,000 mg Oral Q8H   apixaban  5 mg Oral BID   ascorbic acid  500 mg Oral BID   atorvastatin  80 mg Oral Daily   Chlorhexidine Gluconate Cloth  6 each Topical Daily   feeding supplement  237 mL Oral TID BM   furosemide  40 mg Intravenous BID   gabapentin  300 mg Oral QHS   hydrocortisone  5 mg Oral BID   insulin aspart  0-15 Units Subcutaneous TID WC   insulin aspart  0-5 Units Subcutaneous QHS   insulin glargine-yfgn  20 Units Subcutaneous QHS   midodrine  10 mg Oral TID with meals   nutrition supplement (JUVEN)  1 packet Oral BID BM   oxyCODONE  10 mg Oral Q12H   pantoprazole  40 mg Oral Daily   polyethylene glycol  17 g Oral BID   sodium chloride flush  10-40 mL Intracatheter Q12H   tamsulosin  0.4 mg Oral QPC supper   zinc sulfate  220 mg Oral Daily   Continuous Infusions:  sodium chloride 10 mL/hr at 04/11/23 1914   DAPTOmycin (CUBICIN) 800 mg in sodium chloride 0.9 % IVPB Stopped (04/12/23 1544)   ertapenem 1,000 mg (04/13/23 1304)   magnesium sulfate bolus IVPB       LOS: 11 days   Darlin Drop, MD [facility]  04/13/2023, 1:26 PM   *Please note that this is a verbal dictation therefore any spelling or grammatical errors are  due to the "Dragon [employer] One" system interpretation.  Please page via Amion and do not message via secure chat for urgent patient care matters. Secure chat can be used for non urgent patient care matters.  How to contact the [facility] Attending or Consulting provider 7A - 7P or covering provider during after hours 7P -7A, for this patient?  Check the care team in [facility] and look for a) attending/consulting TRH provider listed and b) the [facility] team listed. Page or secure chat 7A-7P. Log into [email or web address] and use [facility]'s universal password  to access. If you do not have the password, please contact the hospital operator. Locate the [facility] provider you are looking for under [facility] and page to a number that you can be directly reached. If you still have difficulty reaching the provider, please page the [facility] (Director on Call) for the Hospitalists listed on amion for assistance.

## 2023-04-13 NOTE — Progress Notes (Signed)
4 Days Post-Op Subjective: Patient with continued pain in the groin incision areas as expected.  Objective: Vital signs in last 24 hours: Temp:  [97.5 F (36.4 C)-97.9 F (36.6 C)] 97.8 F (36.6 C) (06/23 0456) Pulse Rate:  [84] 84 (06/22 2007) Resp:  [13-21] 18 (06/22 2329) BP: (86-110)/(55-75) 103/55 (06/23 0456) SpO2:  [99 %-100 %] 99 % (06/22 2007) Weight:  [106.6 kg-112.5 kg] 112.5 kg (06/23 0737)  Intake/Output from previous day: 06/22 0701 - 06/23 0700 In: 886 [P.O.:720; IV Piggyback:166] Out: 4600 [Urine:4600] Intake/Output this shift: Total I/O In: 477 [P.O.:477] Out: -   Physical Exam:  General: Alert and oriented  Incisions: Groin incisions are intact.  Mild erythema on both sides.  Penrose drains in place with some brownish drainage from each. Ext: NT, No erythema  Lab Results: Recent Labs    04/10/23 1955 04/11/23 0155 04/12/23 0929  HGB 9.9* 10.0* 9.7*  HCT 32.2* 32.3* 31.3*   BMET Recent Labs    04/11/23 0155 04/12/23 0929  NA 132* 132*  K 3.9 3.8  CL 94* 95*  CO2 26 29  GLUCOSE 184* 118*  BUN 37* 34*  CREATININE 0.82 0.83  CALCIUM 8.3* 7.9*     Studies/Results: No results found.  Assessment/Plan: 1.  Fournier's gangrene status post incision and debridement Plan/recommendation continue Penrose drains monitor incision sites and drainage.  Penrose management per Dr. Marlou Porch    LOS: 11 days   Belva Agee 04/13/2023, 11:24 AM

## 2023-04-14 DIAGNOSIS — N390 Urinary tract infection, site not specified: Secondary | ICD-10-CM

## 2023-04-14 DIAGNOSIS — T83511A Infection and inflammatory reaction due to indwelling urethral catheter, initial encounter: Secondary | ICD-10-CM

## 2023-04-14 LAB — CBC
HCT: 28.7 % — ABNORMAL LOW (ref 39.0–52.0)
Hemoglobin: 8.9 g/dL — ABNORMAL LOW (ref 13.0–17.0)
MCH: 23.9 pg — ABNORMAL LOW (ref 26.0–34.0)
MCHC: 31 g/dL (ref 30.0–36.0)
MCV: 76.9 fL — ABNORMAL LOW (ref 80.0–100.0)
Platelets: 126 10*3/uL — ABNORMAL LOW (ref 150–400)
RBC: 3.73 MIL/uL — ABNORMAL LOW (ref 4.22–5.81)
RDW: 19.3 % — ABNORMAL HIGH (ref 11.5–15.5)
WBC: 8.8 10*3/uL (ref 4.0–10.5)
nRBC: 0 % (ref 0.0–0.2)

## 2023-04-14 LAB — BASIC METABOLIC PANEL
Anion gap: 7 (ref 5–15)
BUN: 30 mg/dL — ABNORMAL HIGH (ref 8–23)
CO2: 28 mmol/L (ref 22–32)
Calcium: 7.7 mg/dL — ABNORMAL LOW (ref 8.9–10.3)
Chloride: 95 mmol/L — ABNORMAL LOW (ref 98–111)
Creatinine, Ser: 0.74 mg/dL (ref 0.61–1.24)
GFR, Estimated: >60 mL/min (ref >60)
Glucose, Bld: 174 mg/dL — ABNORMAL HIGH (ref 70–99)
Potassium: 3.9 mmol/L (ref 3.5–5.1)
Sodium: 130 mmol/L — ABNORMAL LOW (ref 135–145)

## 2023-04-14 LAB — GLUCOSE, CAPILLARY
Glucose-Capillary: 196 mg/dL — ABNORMAL HIGH (ref 70–99)
Glucose-Capillary: 167 mg/dL — ABNORMAL HIGH (ref 70–99)
Glucose-Capillary: 137 mg/dL — ABNORMAL HIGH (ref 70–99)
Glucose-Capillary: 154 mg/dL — ABNORMAL HIGH (ref 70–99)
Glucose-Capillary: 127 mg/dL — ABNORMAL HIGH (ref 70–99)

## 2023-04-14 LAB — PHOSPHORUS: Phosphorus: 2.7 mg/dL (ref 2.5–4.6)

## 2023-04-14 LAB — MAGNESIUM: Magnesium: 2.2 mg/dL (ref 1.7–2.4)

## 2023-04-14 MED ORDER — HYDROMORPHONE HCL 1 MG/ML IJ SOLN
0.5000 mg | INTRAMUSCULAR | Status: AC
  Administered 2023-04-14: 0.5 mg via INTRAVENOUS
  Filled 2023-04-14: qty 0.5

## 2023-04-14 NOTE — Progress Notes (Signed)
PT Cancellation Note  Patient Details Name: Peter Yang MRN: [id number] DOB: 1956-08-18   Cancelled Treatment:    Reason Eval/Treat Not Completed: (P) Pain limiting ability to participate (Pt declines all mobility due to pain, stating "not today". Pt requesting that PT come back tomorrow.)   [name] 04/14/2023, 3:12 PM

## 2023-04-14 NOTE — Progress Notes (Signed)
Patient ID: Peter Yang, male   DOB: [birth year], 67 y.o.   MRN: [id number]    Progress Note from the Palliative Medicine Team at [facility]   Patient Name: Peter Yang        Date: 04/14/2023 DOB: 29-Apr-1956  Age: 67 y.o. MRN#: [id number] Attending Physician: Peter Drop, DO Primary Care Physician: Peter Brod, MD Admit Date: 04/01/2023   Extensive chart review has been completed prior to meeting with patient/family  including labs, vital signs, imaging, progress/consult notes, orders, medications and available advance directive documents.   This NP assessed patient at the bedside as a follow up for palliative medicine needs and emotional support.  Peter Yang is oriented, however he is very lethargic today.     He still speaks to hope for better pain control.    Peter Yang has continued to receive fused to participate with therapies, is refusing some medications  We discussed pain management strategies in light of his significant hypotension and lethargy.  We discussed the positive findings continued  wound healing according to urology  Education offered on as wound healing progresses,  hope is for improvement in pain experience with ongoing would healing.   Again we discussed the importance of a holistic approach to pain and symptom management.    Education offered on the importance of mobility and motivation and  his  participation in therapies.     Today Peter Yang is exploring a different approach to care, simply focusing on comfort and dignity.  Education offered on the difference between a full medical support path versus a comfort path foregoing life-prolonging measures.     Education offered on hospice benefit; philosophy and eligibility.  If patient makes decision to shift to full comfort, foregoing life-prolonging measures he would be eligible for hospice services and possibly eligible for inpatient residential hospice.        Symptom management :   Pain in groin area:      -OxyContin 10 mg p.o. every 12 hours     -Oxycodone 5 mg p.o. every 4 hours as needed     -Dilaudid IV reserved for wound changes     - Tylenol 1000 mg p.o. every 8 scheduled    I was able to speak with his friend/ Peter Yang today by telephone.  She is his only listed contact and Peter Yang tells me he is the only person that he is let know what actually is going on with him.   Peter Yang lives in [place] and is unable to visit often however she does speak to him by phone on a regular basis.  She shares today that she too believes Peter Yang may be at a place of foregoing life-prolonging measures.  I spoke with Peter Yang again later in the day, he is very much leaning towards a full comfort approach however he is going to "sleep on it" before making a definitive decision.  I am not here in the hospital tomorrow, my colleague Peter Height  PA-C will f/u with patient tomorrow to further process next steps in treatment plan with Mr Peter Yang.   Questions and concerns addressed   Discussed with Peter Yang, Peter  Yang/urology  PMT will continue to support holistically.   Time: 75  minutes  Detailed review of medical records ( labs, imaging, vital signs), medically appropriate exam ( MS, skin, resp)   discussed with treatment team, counseling and education to patient, family, staff, documenting clinical information, medication management, coordination of care    Lorinda Creed NP  Palliative Medicine Team Team Phone # [phone] Pager [phone]

## 2023-04-14 NOTE — Progress Notes (Signed)
PROGRESS NOTE    Peter Yang  ZOX:[id number] DOB: [birth year] DOA: 04/01/2023 PCP: Annita Brod, MD   Brief Narrative:  67 year old male with a history of left below-knee amputation, chronic combined diastolic and systolic CHF with LVEF 35% and grade 2 diastolic dysfunction, paroxysmal A-fib on Eliquis, recent admission in April 2024 at [facility] for right diabetic foot ulcer status post fifth ray amputation, hypertension, type 2 diabetes who presents to the ER from [place] nursing home due to pus and blood from his Foley catheter.  Patient had acute urinary retention on Saturday, June 8.  He had a Foley catheter placed at that time.  CT scan showed findings c/w fournier's gangrene.  Seen by urology, status post I&D on 04/02/23.  Returned to OR 04/09/23 for wound closure.   Cardiology has been consulted with concern for heart failure/volume overload, but they noted his poor cardiovascular prognosis and did not suspect he was in decompensated heart failure.  At this time, on antibiotics per ID (plan for 6 weeks after last surgical procedure).  04/14/2023: The patient was seen and examined at his bedside.  Reports having 10 out of 10 pain in his groins bilaterally.  IV Dilaudid 0.5 mg x 1 ordered.  The patient is currently on IV daptomycin and Invanz for treatment of his Fournier gangrene, MRSA screening test positive.  Still with gross hematuria, urology following.  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 Hypoalbuminemia  Anasarca  Echo done on 04/07/2023 with EF 35%, grade II diastolic dysfunction, mildly reduce RVSF, RV size moderately enlarged, severe MV regurg. Volume overloaded on exam with dependent edema on bilateral thighs. Albumin is 2.2. CXR with increased density in both lower lung fields, bilateral effusions, possible atelectasis/pneumonia (clinically not c/w pneumonia) Cardiology notes limited cardiovascular options, but did not think he was in decompensated HF. Note poor cardiovascular prognosis.  See 6/16 note.  Palliative care consulted.   Currently on IV Lasix 40 mg daily. 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  No evidence of osteo.  Diffuse myositis throughout pelvic and thigh musculature.  Underwent wound washout and closure with exchange of the Foley catheter by Dr. Marlou Porch 04/09/2023.  No further surgical plans by urology.  PICC line placed 04/10/2023.  ID discontinued Merrem and linezolid and started on ertapenem and daptomycin on 04/10/2023 with the plan to continue  both antibiotics for 6 weeks with EOT on 05/20/2023. Stop SGLT2 at discharge, added to "allergies/contraindications" list Urology following, appreciate assistance. The patient is on Eliquis for paroxysmal A-fib, continue to closely monitor  H&H.  Acute blood loss anemia in the setting of gross hematuria Hemoglobin downtrending 8.9 from 9.4 Urology following May need to hold off Eliquis if hematuria persists  Hypotension, asymptomatic  Long term current use of systemic steroids Unclear why he is still on cortef on admission.  Was on 10 mg Solu-Cortef in the morning and 5 at night. Appears to have started in October/November 2023.I think these were started due to his chronic hypotension. was started to wean 04/09/2023 and was stopped 6/20; however, given how long he has been on this medication will continue at least 5 mg BID and then taper.  Blood pressure was low, midodrine was increased from 5 mg to 10 mg TID. Ideally he would not be on this medication as he has no notable symptoms, and no evidence of disruption of end organ perfusion.  -on cortef 5 mg BID, taper over weeks  Currently on midodrine 10 mg 3 times daily Continue to closely monitor vital signs.  Hypervolemic Hyponatremia Remains grossly overloaded on exam.  Sodium stable around 130.   Currently on IV diuretics, Lasix 40 mg daily.   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 Continue local wound care   S/P BKA (below knee amputation) unilateral, left (HCC) Stable.   Type 2 diabetes mellitus with diabetic peripheral angiopathy without gangrene, without long-term current use of insulin (HCC) Continue with SSI and 20 units of lantus.     History of urinary retention Foley catheter placed on Saturday 03-29-2023.  Previous notes suggest that he is supposed to be on Flomax but he is not.  Started him on Flomax, he underwent Foley exchange 04/09/2023.  PAF (paroxysmal atrial fibrillation) (HCC) on  Eliquis Currently not on rate control agent.  Rate controlled.   Essential hypertension Stable. Has hx of orthostatic hypotension. Vital signs are currently stable, on midodrine 10 mg 3 times daily Continue to monitor vital signs  Prostate Cancer Follow outpatient with urology  DVT prophylaxis: SCDs Start: 04/02/23 0044 Home Eliquis.   Code Status: DNR  Family Communication: None present at bedside.    Time: 50 minutes.  Status is: Inpatient Remains inpatient appropriate because: Patient is now medically stable, he came from [facility], per [facility] note from yesterday, due to pending bills, facility is not willing to accept him back.  TOC is working on finding a place for him.   Estimated body mass index is 30.19 kg/m as calculated from the following:   Height as of this encounter: 6\' 4"  (1.93 m).   Weight as of this encounter: 112.5 kg.  Pressure Injury 08/03/22 Buttocks Mid Stage 2 -  Partial thickness loss of dermis presenting as a shallow open injury with a red, pink wound bed without slough. area of red blanchable and non blanchable, and areas with skin sloughed off (Active)  08/03/22 1513  Location: Buttocks  Location Orientation: Mid  Staging: Stage 2 -  Partial thickness loss of dermis presenting as a shallow open injury with a red, pink wound bed without slough.  Wound Description (Comments): area of red blanchable  and non blanchable, and areas with skin sloughed off  Present on Admission: Yes  Dressing Type Foam - Lift dressing to assess site every shift 04/13/23 2100   Nutritional Assessment: Body mass index is 30.19 kg/m.[name] Seen by dietician.  I agree with the assessment and plan as outlined below: Nutrition Status: Nutrition Problem: Increased nutrient needs Etiology: wound healing Signs/Symptoms: estimated needs Interventions: Premier Protein, Juven  . Skin Assessment: I have examined the patient's skin and I agree with the wound assessment as performed  by the wound care RN as outlined below: Pressure Injury 08/03/22 Buttocks Mid Stage 2 -  Partial thickness loss of dermis presenting as a shallow open injury with a red, pink wound bed without slough. area of red blanchable and non blanchable, and areas with skin sloughed off (Active)  08/03/22 1513  Location: Buttocks  Location Orientation: Mid  Staging: Stage 2 -  Partial thickness loss of dermis presenting as a shallow open injury with a red, pink wound bed without slough.  Wound Description (Comments): area of red blanchable and non blanchable, and areas with skin sloughed off  Present on Admission: Yes  Dressing Type Foam - Lift dressing to assess site every shift 04/13/23 2100    Consultants:  Urology and ID  Procedures:  As above  Antimicrobials:  Anti-infectives (From admission, onward)    Start     Dose/Rate Route Frequency Ordered Stop   04/10/23 1400  DAPTOmycin (CUBICIN) 800 mg in sodium chloride 0.9 % IVPB        8 mg/kg  98.8 kg (Adjusted) 132 mL/hr over 30 Minutes Intravenous Daily 04/10/23 1206 05/20/23 2359   04/10/23 1400  ertapenem (INVANZ) 1,000 mg in sodium chloride 0.9 % 100 mL IVPB        1 g 200 mL/hr over 30 Minutes Intravenous Every 24 hours 04/10/23 1206 05/20/23 2359   04/04/23 1000  linezolid (ZYVOX) IVPB 600 mg  Status:  Discontinued        600 mg 300 mL/hr over 60 Minutes Intravenous Every 12 hours 04/04/23 0902 04/10/23 1205   04/04/23 0200  vancomycin (VANCOCIN) IVPB 1000 mg/200 mL premix  Status:  Discontinued        1,000 mg 200 mL/hr over 60 Minutes Intravenous Every 12 hours 04/03/23 1936 04/04/23 0902   04/03/23 1400  vancomycin (VANCOREADY) IVPB 1500 mg/300 mL  Status:  Discontinued        1,500 mg 150 mL/hr over 120 Minutes Intravenous Every 12 hours 04/03/23 1302 04/03/23 1936   04/02/23 1400  vancomycin (VANCOCIN) IVPB 1000 mg/200 mL premix  Status:  Discontinued        1,000 mg 200 mL/hr over 60 Minutes Intravenous Every 12 hours  04/02/23 0036 04/03/23 1302   04/02/23 0115  vancomycin (VANCOREADY) IVPB 2000 mg/400 mL        2,000 mg 200 mL/hr over 120 Minutes Intravenous  Once 04/02/23 0015 04/02/23 0529   04/02/23 0100  clindamycin (CLEOCIN) IVPB 900 mg  Status:  Discontinued        900 mg 100 mL/hr over 30 Minutes Intravenous Every 8 hours 04/02/23 0002 04/02/23 0005   04/02/23 0100  clindamycin (CLEOCIN) IVPB 900 mg  Status:  Discontinued        900 mg 100 mL/hr over 30 Minutes Intravenous Every 8 hours 04/02/23 0012 04/04/23 0902   04/02/23 0000  meropenem (MERREM) 1 g in sodium chloride 0.9 % 100 mL IVPB  Status:  Discontinued  1 g 200 mL/hr over 30 Minutes Intravenous Every 8 hours 04/01/23 2352 04/10/23 1205   04/01/23 2215  meropenem (MERREM) 2 g in sodium chloride 0.9 % 100 mL IVPB  Status:  Discontinued        2 g 280 mL/hr over 30 Minutes Intravenous  Once 04/01/23 2221 04/01/23 2352   04/01/23 1800  cefTRIAXone (ROCEPHIN) 1 g in sodium chloride 0.9 % 100 mL IVPB        1 g 200 mL/hr over 30 Minutes Intravenous  Once 04/01/23 1746 04/01/23 1902           Objective: Vitals:   04/13/23 2300 04/14/23 0414 04/14/23 0757 04/14/23 0853  BP:   94/74 107/72  Pulse: 85 86 79   Resp:   15 12  Temp: 97.7 F (36.5 C) 97.8 F (36.6 C) 97.9 F (36.6 C)   TempSrc: Oral Oral Oral   SpO2: 99% 99% 97%   Weight:  112.5 kg    Height:        Intake/Output Summary (Last 24 hours) at 04/14/2023 0957 Last data filed at 04/14/2023 0900 Gross per 24 hour  Intake 470.69 ml  Output 2800 ml  Net -2329.31 ml   Filed Weights   04/12/23 1422 04/13/23 0737 04/14/23 0414  Weight: 106.6 kg 112.5 kg 112.5 kg    Examination:  Well-developed, well nourished in no acute stress.  He is alert oriented x 3. Clear to auscultation with no wheezes or rales. Regular rate and rhythm no rubs or gallops. Soft obese nondistended bowel sounds present.   Erythema noted around sutures left groin greater than  right. Alert and oriented x 3.   Data Reviewed: I have personally reviewed following labs and imaging studies  CBC: Recent Labs  Lab 04/09/23 0053 04/10/23 1955 04/11/23 0155 04/12/23 0929 04/13/23 1100 04/14/23 0620  WBC 15.3* 9.6 8.7 9.5 9.2 8.8  NEUTROABS 12.2* 7.4 6.8  --   --   --   HGB 10.1* 9.9* 10.0* 9.7* 9.4* 8.9*  HCT 33.4* 32.2* 32.3* 31.3* 30.3* 28.7*  MCV 76.8* 78.7* 76.4* 78.3* 77.3* 76.9*  PLT 323 215 222 171 148* 126*   Basic Metabolic Panel: Recent Labs  Lab 04/09/23 0053 04/10/23 1955 04/11/23 0155 04/12/23 0929 04/13/23 1100 04/14/23 0620  NA 130* 130* 132* 132* 131* 130*  K 3.9 3.8 3.9 3.8 3.9 3.9  CL 98 97* 94* 95* 95* 95*  CO2 22 26 26 29 27 28   GLUCOSE 188* 208* 184* 118* 210* 174*  BUN 48* 37* 37* 34* 32* 30*  CREATININE 0.81 0.89 0.82 0.83 0.80 0.74  CALCIUM 8.1* 7.9* 8.3* 7.9* 7.8* 7.7*  MG 1.8  --   --  1.7 1.6* 2.2  PHOS 2.6  --   --   --   --  2.7   GFR: Estimated Creatinine Clearance: 124.7 mL/min (by C-G formula based on SCr of 0.74 mg/dL). Liver Function Tests: Recent Labs  Lab 04/09/23 0053 04/13/23 1100  AST 23 27  ALT 15 20  ALKPHOS 60 84  BILITOT 1.0 0.5  PROT 5.1* 5.0*  ALBUMIN 2.2* 2.0*   No results for input(s): "LIPASE", "AMYLASE" in the last 168 hours. No results for input(s): "AMMONIA" in the last 168 hours. Coagulation Profile: No results for input(s): "INR", "PROTIME" in the last 168 hours. Cardiac Enzymes: Recent Labs  Lab 04/10/23 1955  CKTOTAL 24*   BNP (last 3 results) No results for input(s): "PROBNP" in the last 8760 hours. HbA1C:  No results for input(s): "HGBA1C" in the last 72 hours. CBG: Recent Labs  Lab 04/13/23 1304 04/13/23 1639 04/13/23 2141 04/14/23 0615 04/14/23 0944  GLUCAP 194* 173* 175* 196* 167*   Lipid Profile: No results for input(s): "CHOL", "HDL", "LDLCALC", "TRIG", "CHOLHDL", "LDLDIRECT" in the last 72 hours. Thyroid Function Tests: No results for input(s): "TSH",  "T4TOTAL", "FREET4", "T3FREE", "THYROIDAB" in the last 72 hours. Anemia Panel: Recent Labs    04/12/23 0929  FERRITIN 54  TIBC 230*  IRON 50   Sepsis Labs: Recent Labs  Lab 04/11/23 0829  LATICACIDVEN 1.6    No results found for this or any previous visit (from the past 240 hour(s)).    Radiology Studies: No results found.  Scheduled Meds:  acetaminophen  1,000 mg Oral Q8H   apixaban  5 mg Oral BID   ascorbic acid  500 mg Oral BID   atorvastatin  80 mg Oral Daily   Chlorhexidine Gluconate Cloth  6 each Topical Daily   feeding supplement  237 mL Oral TID BM   furosemide  40 mg Intravenous Daily   gabapentin  300 mg Oral QHS   hydrocortisone  5 mg Oral BID    HYDROmorphone (DILAUDID) injection  0.5 mg Intravenous STAT   insulin aspart  0-15 Units Subcutaneous TID WC   insulin aspart  0-5 Units Subcutaneous QHS   insulin glargine-yfgn  20 Units Subcutaneous QHS   midodrine  10 mg Oral TID with meals   nutrition supplement (JUVEN)  1 packet Oral BID BM   oxyCODONE  10 mg Oral Q12H   pantoprazole  40 mg Oral Daily   polyethylene glycol  17 g Oral BID   sodium chloride flush  10-40 mL Intracatheter Q12H   tamsulosin  0.4 mg Oral QPC supper   zinc sulfate  220 mg Oral Daily   Continuous Infusions:  sodium chloride 10 mL/hr at 04/13/23 1651   DAPTOmycin (CUBICIN) 800 mg in sodium chloride 0.9 % IVPB Stopped (04/13/23 1406)   ertapenem Stopped (04/13/23 1334)     LOS: 12 days   Darlin Drop, MD [facility]  04/14/2023, 9:57 AM   *Please note that this is a verbal dictation therefore any spelling or grammatical errors are due to the "Dragon Medical One" system interpretation.  Please page via Amion and do not message via secure chat for urgent patient care matters. Secure chat can be used for non urgent patient care matters.  How to contact the [facility] Attending or Consulting provider 7A - 7P or covering provider during after hours 7P -7A, for this patient?   Check the care team in [facility] and look for a) attending/consulting TRH provider listed and b) the [facility] team listed. Page or secure chat 7A-7P. Log into [email or web address] and use [facility]'s universal password to access. If you do not have the password, please contact the hospital operator. Locate the [facility] provider you are looking for under [facility] and page to a number that you can be directly reached. If you still have difficulty reaching the provider, please page the [facility] (Director on Call) for the Hospitalists listed on amion for assistance.

## 2023-04-14 NOTE — Progress Notes (Signed)
Pt refused taking miralax. RN educated pt about the need of taking this med but pt still refused it. MD notified and aware.   Lawson Radar, RN

## 2023-04-14 NOTE — Progress Notes (Addendum)
5 Days Post-Op Subjective: NAEON. Patient is extremely lethargic and refuses to attempt to participate in care. He remains resistant to dressing changes.  Alert and oriented.  Objective: Vital signs in last 24 hours: Temp:  [97.7 F (36.5 C)-98.2 F (36.8 C)] 97.8 F (36.6 C) (06/24 1243) Pulse Rate:  [79-89] 89 (06/24 1243) Resp:  [12-18] 18 (06/24 1243) BP: (92-107)/(65-74) 92/65 (06/24 1243) SpO2:  [95 %-99 %] 95 % (06/24 1243) Weight:  [112.5 kg] 112.5 kg (06/24 0414)  Intake/Output from previous day: 06/23 0701 - 06/24 0700 In: 947.7 [P.O.:717; IV Piggyback:230.7] Out: 2800 [Urine:2800]  Intake/Output this shift: Total I/O In: 0  Out: 1250 [Urine:1250]  Physical Exam:  General: Alert and oriented CV: No cyanosis Lungs: equal chest rise Abdomen: Soft, NTND, no rebound or guarding Gu: Foley catheter in place draining mildly blood-tinged urine.  No areas of necrosis noted. No purulent drainage.   Lab Results: Recent Labs    04/12/23 0929 04/13/23 1100 04/14/23 0620  HGB 9.7* 9.4* 8.9*  HCT 31.3* 30.3* 28.7*   BMET Recent Labs    04/13/23 1100 04/14/23 0620  NA 131* 130*  K 3.9 3.9  CL 95* 95*  CO2 27 28  GLUCOSE 210* 174*  BUN 32* 30*  CREATININE 0.80 0.74  CALCIUM 7.8* 7.7*     Studies/Results: No results found.  Assessment/Plan: # Fournier's gangrene Initially taken to the OR on 04/02/2023 by Dr. Marlou Porch for debridement of Fournier's gangrene. Taken back 04/09/2023 for second look and closure.  Penrose drains in place.  Hypoalbuminemia secondary to protein calorie malnutrition continues to persist, though has improved significantly with IV albumin and Lasix treatments from primary team.    Long discussion with palliative care today.  Patient is lethargic and unwilling to participate in his care.  Despite the fact that he has a favorable urologic prognosis, I fear that ongoing neglect soon as he is out from under 24-hour nursing care will cause  repeat infections. They will continue to discuss goals of care and comfort measures with him.  Broad ABX per primary  Will see as needed for wound care/assessment. Please call with questions   # Hematuria Mild hematuria on rounds today.  Unclear etiology.  Most likely explained by the patient being on twice daily Eliquis.  Having help turning clean him several times, I can say that his catheter is likely getting pulled at least occasionally.  Continue to trend hemoglobin     LOS: 12 days   04/14/2023, 2:10 PM

## 2023-04-15 DIAGNOSIS — R310 Gross hematuria: Secondary | ICD-10-CM

## 2023-04-15 DIAGNOSIS — Z7189 Other specified counseling: Secondary | ICD-10-CM

## 2023-04-15 DIAGNOSIS — N493 Fournier gangrene: Secondary | ICD-10-CM

## 2023-04-15 DIAGNOSIS — Z89512 Acquired absence of left leg below knee: Secondary | ICD-10-CM

## 2023-04-15 DIAGNOSIS — N3001 Acute cystitis with hematuria: Secondary | ICD-10-CM

## 2023-04-15 DIAGNOSIS — Z66 Do not resuscitate: Secondary | ICD-10-CM

## 2023-04-15 LAB — CBC
HCT: 28.6 % — ABNORMAL LOW (ref 39.0–52.0)
Hemoglobin: 8.7 g/dL — ABNORMAL LOW (ref 13.0–17.0)
MCH: 24.2 pg — ABNORMAL LOW (ref 26.0–34.0)
MCHC: 30.4 g/dL (ref 30.0–36.0)
MCV: 79.7 fL — ABNORMAL LOW (ref 80.0–100.0)
Platelets: 126 10*3/uL — ABNORMAL LOW (ref 150–400)
RBC: 3.59 MIL/uL — ABNORMAL LOW (ref 4.22–5.81)
RDW: 19.7 % — ABNORMAL HIGH (ref 11.5–15.5)
WBC: 8.8 10*3/uL (ref 4.0–10.5)
nRBC: 0 % (ref 0.0–0.2)

## 2023-04-15 LAB — COMPREHENSIVE METABOLIC PANEL
ALT: 20 U/L (ref 0–44)
AST: 29 U/L (ref 15–41)
Albumin: 2 g/dL — ABNORMAL LOW (ref 3.5–5.0)
Alkaline Phosphatase: 81 U/L (ref 38–126)
Anion gap: 8 (ref 5–15)
BUN: 27 mg/dL — ABNORMAL HIGH (ref 8–23)
CO2: 29 mmol/L (ref 22–32)
Calcium: 7.9 mg/dL — ABNORMAL LOW (ref 8.9–10.3)
Chloride: 96 mmol/L — ABNORMAL LOW (ref 98–111)
Creatinine, Ser: 0.69 mg/dL (ref 0.61–1.24)
GFR, Estimated: >60 mL/min (ref >60)
Glucose, Bld: 150 mg/dL — ABNORMAL HIGH (ref 70–99)
Potassium: 3.9 mmol/L (ref 3.5–5.1)
Sodium: 133 mmol/L — ABNORMAL LOW (ref 135–145)
Total Bilirubin: 0.6 mg/dL (ref 0.3–1.2)
Total Protein: 5.1 g/dL — ABNORMAL LOW (ref 6.5–8.1)

## 2023-04-15 LAB — GLUCOSE, CAPILLARY
Glucose-Capillary: 146 mg/dL — ABNORMAL HIGH (ref 70–99)
Glucose-Capillary: 141 mg/dL — ABNORMAL HIGH (ref 70–99)
Glucose-Capillary: 140 mg/dL — ABNORMAL HIGH (ref 70–99)
Glucose-Capillary: 162 mg/dL — ABNORMAL HIGH (ref 70–99)

## 2023-04-15 LAB — COPPER, SERUM: Copper: 114 ug/dL (ref 69–132)

## 2023-04-15 LAB — VITAMIN A: Vitamin A (Retinoic Acid): 10.7 ug/dL — ABNORMAL LOW (ref 22.0–69.5)

## 2023-04-15 MED ORDER — SODIUM CHLORIDE 0.9 % IV BOLUS
500.0000 mL | INTRAVENOUS | Status: AC
  Administered 2023-04-15: 500 mL via INTRAVENOUS

## 2023-04-15 MED ORDER — ALBUMIN HUMAN 25 % IV SOLN
25.0000 g | Freq: Four times a day (QID) | INTRAVENOUS | Status: AC
  Administered 2023-04-15 (×2): 25 g via INTRAVENOUS
  Filled 2023-04-15 (×2): qty 100

## 2023-04-15 MED ORDER — MIDODRINE HCL 5 MG PO TABS: 10.0000 mg | ORAL_TABLET | ORAL | Status: AC

## 2023-04-15 MED ORDER — MIDODRINE HCL 5 MG PO TABS
10.0000 mg | ORAL_TABLET | Freq: Three times a day (TID) | ORAL | Status: DC
  Administered 2023-04-15 – 2023-04-18 (×12): 10 mg via ORAL
  Filled 2023-04-15 (×12): qty 2

## 2023-04-15 NOTE — TOC Progression Note (Addendum)
Transition of Care [facility]) - Progression Note    Patient Details  Name: Peter Yang MRN: [id number] Date of Birth: [birth year]  Transition of Care [facility]) CM/SW Contact  Eduard Roux, [place] Phone Number: 04/15/2023, 1:01 PM  Clinical Narrative:     TOC continues to follow for final disposition plan.  Expected Discharge Plan: Skilled Nursing Facility Barriers to Discharge: Continued Medical Work up  Expected Discharge Plan and Services   Discharge Planning Services: CM Consult Post Acute Care Choice: Skilled Nursing Facility Living arrangements for the past 2 months: Skilled Nursing Facility                                       Social Determinants of Health (SDOH) Interventions SDOH Screenings   Food Insecurity: No Food Insecurity (04/02/2023)  Housing: Low Risk  (04/02/2023)  Transportation Needs: No Transportation Needs (04/02/2023)  Utilities: Not At Risk (04/02/2023)  Tobacco Use: Low Risk  (04/10/2023)    Readmission Risk Interventions    10/24/2022   11:21 AM 09/02/2022   10:36 AM  Readmission Risk Prevention Plan  Transportation Screening Complete Complete  Medication Review (RN Care Manager) Complete Complete  PCP or Specialist appointment within 3-5 days of discharge Complete Complete  HRI or Home Care Consult Complete Complete  SW Recovery Care/Counseling Consult Complete Complete  Palliative Care Screening Not Applicable Not Applicable  Skilled Nursing Facility Complete Complete

## 2023-04-15 NOTE — Progress Notes (Signed)
Daily Progress Note   Patient Name: Peter Yang       Date: 04/15/2023 DOB: 1956-04-17  Age: 67 y.o. MRN#: [id number] Attending Physician: Darlin Drop, DO Primary Care Physician: Annita Brod, MD Admit Date: 04/01/2023  Reason for Consultation/Follow-up: Establishing goals of care  Subjective: Medical records reviewed including progress notes, labs, and imaging. Patient assessed at the bedside. He reports 10/10 pain. Discussed with RN. Opioids not being administered due to hypotension and intermittent somnolence.   Created space and opportunity for patient's thoughts and feelings on his current illness.  Reviewed his conversation with my colleague yesterday.  He tells me he has not thought much further about comfort care since then.  He would like more time to reflect on this and decide.  Assisted him with attending to call his friend, however went to voicemail.  He requested some water and agreed to further conversation tomorrow.  Questions and concerns addressed. PMT will continue to support holistically.   Length of Stay: 13   Physical Exam Vitals and nursing note reviewed.  Constitutional:      General: He is not in acute distress.    Appearance: He is ill-appearing.  Cardiovascular:     Rate and Rhythm: Normal rate.  Pulmonary:     Effort: Pulmonary effort is normal. No respiratory distress.  Neurological:     Mental Status: He is alert. Mental status is at baseline.  Psychiatric:        Mood and Affect: Affect is blunt.        Behavior: Behavior normal.             Vital Signs: BP 93/65 (BP Location: Left Arm)   Pulse 95   Temp 97.6 F (36.4 C) (Oral)   Resp 19   Ht 6\' 4"  (1.93 m)   Wt 104.8 kg   SpO2 98%   BMI 28.12 kg/m  SpO2: SpO2: 98 % O2 Device: O2  Device: Room Air O2 Flow Rate: O2 Flow Rate (L/min): 0 L/min  Palliative Care Assessment & Plan   Patient Profile: 67 y.o. male  admitted on 04/01/2023 with A past medical  history of left below-knee amputation, chronic systolic heart failure, history of A-fib, recent admission in April 2024 at [facility] for right diabetic foot ulcer status post amputation, hypertension, type 2  diabetes who presents to the ER today from [place] nursing home due to pus and blood from his Foley catheter.     Patient had acute urinary tension on Saturday, June 8.  He had a Foley catheter placed at that time.  He started developing pain in his penis yesterday.  He was sent to the ER for evaluation.   Patient faces treatment option decisions, advanced directives and anticipatory care needs.   Assessment: Goals of care conversation UTI due to indwelling catheter Acute on chronic combined CHF Hypoalbuminemia Fournier's gangrene status post left BKA  Recommendations/Plan: Continue DNR/DNI Continue current care Patient has not yet decided on whether he would like comfort focused care.  Ongoing goals of care discussions Psychosocial and emotional support provided PMT will continue to follow and support   Prognosis: Poor long-term prognosis  Discharge Planning: To Be Determined  Care plan was discussed with patient, RN   MDM high         Eila Runyan Jeni Salles, PA-C  Palliative Medicine Team Team phone # [phone]  Thank you for allowing the Palliative Medicine Team to assist in the care of this patient. Please utilize secure chat with additional questions, if there is no response within 30 minutes please call the above phone number.  Palliative Medicine Team providers are available by phone from 7am to 7pm daily and can be reached through the team cell phone.  Should this patient require assistance outside of these hours, please call the patient's attending physician.

## 2023-04-15 NOTE — Progress Notes (Signed)
PROGRESS NOTE    Peter Yang  FTD:[id number] DOB: [birth year] DOA: 04/01/2023 PCP: Annita Brod, MD   Brief Narrative:  67 year old male with a history of left below-knee amputation, chronic combined diastolic and systolic CHF with LVEF 35% and grade 2 diastolic dysfunction, paroxysmal A-fib on Eliquis, recent admission in April 2024 at [facility] for right diabetic foot ulcer status post fifth ray amputation, hypertension, type 2 diabetes who presents to the ER from [facility] nursing [facility] due to pus and blood from his Foley catheter.  Patient had acute urinary retention on Saturday, June 8.  He had a Foley catheter placed at that time.  CT scan showed findings c/w fournier's gangrene.  Seen by urology, status post I&D on 04/02/23.  Returned to OR 04/09/23 for wound closure.   Cardiology has been consulted with concern for heart failure/volume overload, but they noted his poor cardiovascular prognosis and did not suspect he was in decompensated heart failure.  At this time, on antibiotics per ID (plan for 6 weeks after last surgical procedure).  Seen by palliative care for goals of care discussions.  04/15/2023: The patient was seen and examined at bedside.  Hypotensive early on in the shift.  IV Lasix at home Eliquis held.  Received 500 cc IV fluid bolus NS and an extra dose of midodrine 10 mg x 1.      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 Hypoalbuminemia  Anasarca  Echo done on 04/07/2023 with EF 35%, grade II diastolic dysfunction, mildly reduce RVSF, RV size moderately enlarged, severe MV regurg. Volume overloaded on exam with dependent edema on bilateral thighs. Albumin is 2.2. CXR with increased density in both lower lung fields, bilateral effusions, possible atelectasis/pneumonia (clinically not c/w pneumonia) Cardiology notes limited cardiovascular options, but did not think he was in decompensated HF. Note poor cardiovascular prognosis.  See 6/16 note.  Palliative care consulted.   IV Lasix stopped 04/15/2023 due to hypotension Continue strict I's and O's and daily weight Net I&O -18.6 L  Hypotension Continue to hold off any blood pressure medications IV Lasix DC'd on 04/15/2023 Received 500 cc IV fluid bolus NS and IV albumin 25 g twice daily every 6 hours x 2 doses. Maintain MAP greater than 65.  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  No evidence of osteo.  Diffuse myositis throughout pelvic and thigh musculature.  Underwent wound washout and closure with exchange  of the Foley catheter by Dr. Marlou Porch 04/09/2023.  No further surgical plans by urology.  PICC line placed 04/10/2023.  ID discontinued Merrem and linezolid and started on ertapenem and daptomycin on 04/10/2023 with the plan to continue both antibiotics for 6 weeks with EOT on 05/20/2023. Stop SGLT2  at discharge, added to "allergies/contraindications" list Urology following, appreciate assistance. Eliquis held on 04/15/2023 due to gross hematuria, resume Eliquis when hematuria has resolved.  Acute blood loss anemia in the setting of gross hematuria Hemoglobin downtrending 8.7 from 8.9 from 9.4 Hold off home Eliquis, and restart once gross hematuria has resolved.  Long term current use of systemic steroids Unclear why he is still on cortef on admission.  Was on 10 mg Solu-Cortef in the morning and 5 at night. Appears to have started in October/November 2023.I think these were started due to his chronic hypotension. was started to wean 04/09/2023 and was stopped 6/20; however, given how long he has been on this medication will continue at least 5 mg BID and then taper.  Blood pressure was low, midodrine was increased from 5 mg to 10 mg TID. Ideally he would not be on this medication as he has no notable symptoms, and no evidence of disruption of end organ perfusion.  -on cortef 5 mg BID, taper over weeks  Currently on midodrine 10 mg 3 times daily Continue to closely monitor vital signs.  Improving hypervolemic Hyponatremia Serum sodium 133 from 130.  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 Continue local wound care.   S/P BKA (below knee amputation) unilateral, left (HCC)    Type 2 diabetes mellitus with diabetic peripheral angiopathy without gangrene, without long-term current use of insulin (HCC) Continue with SSI and 20 units of lantus nightly.     History of urinary retention Foley catheter placed on Saturday 03-29-2023.  Previous notes suggest that he is supposed to be on Flomax but he is not.  Started him on Flomax, he underwent Foley exchange  04/09/2023.  PAF (paroxysmal atrial fibrillation) (HCC) on Eliquis Currently not on rate control agent.  Rate controlled.   Essential hypertension Stable. Has hx of orthostatic hypotension. Vital signs are currently stable, on midodrine 10 mg 3 times daily Continue to monitor vital signs  Prostate Cancer Follow outpatient with urology  Goals of care Currently DNR Palliative care following     DVT prophylaxis: SCDs Start: 04/02/23 0044 SCDs on 04/15/2023, Home Eliquis held due to gross hematuria, resume once gross hematuria stops.   Code Status: DNR  Family Communication: None present at bedside.    Time: 50 minutes.  Status is: Inpatient Remains inpatient appropriate because: Patient is now medically stable, he came from [facility], per [facility] note from yesterday, due to pending bills, facility is not willing to accept him back.  TOC is working on finding a place for him.   Estimated body mass index is 28.12 kg/m as calculated from the following:   Height as of this encounter: 6\' 4"  (1.93 m).   Weight as of this encounter: 104.8 kg.  Pressure Injury 08/03/22 Buttocks Mid Stage 2 -  Partial thickness loss of dermis presenting as a shallow open injury with a red, pink wound bed without slough. area of red blanchable and non blanchable, and areas with skin sloughed off (  Active)  08/03/22 1513  Location: Buttocks  Location Orientation: Mid  Staging: Stage 2 -  Partial thickness loss of dermis presenting as a shallow open injury with a red, pink wound bed without slough.  Wound Description (Comments): area of red blanchable and non blanchable, and areas with skin sloughed off  Present on Admission: Yes  Dressing Type Foam - Lift dressing to assess site every shift 04/15/23 0830   Nutritional Assessment: Body mass index is 28.12 kg/m.[name] Seen by dietician.  I agree with the assessment and plan as outlined below: Nutrition Status: Nutrition Problem: Increased nutrient  needs Etiology: wound healing Signs/Symptoms: estimated needs Interventions: Premier Protein, Juven  . Skin Assessment: I have examined the patient's skin and I agree with the wound assessment as performed by the wound care RN as outlined below: Pressure Injury 08/03/22 Buttocks Mid Stage 2 -  Partial thickness loss of dermis presenting as a shallow open injury with a red, pink wound bed without slough. area of red blanchable and non blanchable, and areas with skin sloughed off (Active)  08/03/22 1513  Location: Buttocks  Location Orientation: Mid  Staging: Stage 2 -  Partial thickness loss of dermis presenting as a shallow open injury with a red, pink wound bed without slough.  Wound Description (Comments): area of red blanchable and non blanchable, and areas with skin sloughed off  Present on Admission: Yes  Dressing Type Foam - Lift dressing to assess site every shift 04/15/23 0830    Consultants:  Urology  ID Palliative care  Procedures:  As above  Antimicrobials:  Anti-infectives (From admission, onward)    Start     Dose/Rate Route Frequency Ordered Stop   04/10/23 1400  DAPTOmycin (CUBICIN) 800 mg in sodium chloride 0.9 % IVPB        8 mg/kg  98.8 kg (Adjusted) 132 mL/hr over 30 Minutes Intravenous Daily 04/10/23 1206 05/20/23 2359   04/10/23 1400  ertapenem (INVANZ) 1,000 mg in sodium chloride 0.9 % 100 mL IVPB        1 g 200 mL/hr over 30 Minutes Intravenous Every 24 hours 04/10/23 1206 05/20/23 2359   04/04/23 1000  linezolid (ZYVOX) IVPB 600 mg  Status:  Discontinued        600 mg 300 mL/hr over 60 Minutes Intravenous Every 12 hours 04/04/23 0902 04/10/23 1205   04/04/23 0200  vancomycin (VANCOCIN) IVPB 1000 mg/200 mL premix  Status:  Discontinued        1,000 mg 200 mL/hr over 60 Minutes Intravenous Every 12 hours 04/03/23 1936 04/04/23 0902   04/03/23 1400  vancomycin (VANCOREADY) IVPB 1500 mg/300 mL  Status:  Discontinued        1,500 mg 150 mL/hr over 120  Minutes Intravenous Every 12 hours 04/03/23 1302 04/03/23 1936   04/02/23 1400  vancomycin (VANCOCIN) IVPB 1000 mg/200 mL premix  Status:  Discontinued        1,000 mg 200 mL/hr over 60 Minutes Intravenous Every 12 hours 04/02/23 0036 04/03/23 1302   04/02/23 0115  vancomycin (VANCOREADY) IVPB 2000 mg/400 mL        2,000 mg 200 mL/hr over 120 Minutes Intravenous  Once 04/02/23 0015 04/02/23 0529   04/02/23 0100  clindamycin (CLEOCIN) IVPB 900 mg  Status:  Discontinued        900 mg 100 mL/hr over 30 Minutes Intravenous Every 8 hours 04/02/23 0002 04/02/23 0005   04/02/23 0100  clindamycin (CLEOCIN) IVPB 900 mg  Status:  Discontinued  900 mg 100 mL/hr over 30 Minutes Intravenous Every 8 hours 04/02/23 0012 04/04/23 0902   04/02/23 0000  meropenem (MERREM) 1 g in sodium chloride 0.9 % 100 mL IVPB  Status:  Discontinued        1 g 200 mL/hr over 30 Minutes Intravenous Every 8 hours 04/01/23 2352 04/10/23 1205   04/01/23 2215  meropenem (MERREM) 2 g in sodium chloride 0.9 % 100 mL IVPB  Status:  Discontinued        2 g 280 mL/hr over 30 Minutes Intravenous  Once 04/01/23 2221 04/01/23 2352   04/01/23 1800  cefTRIAXone (ROCEPHIN) 1 g in sodium chloride 0.9 % 100 mL IVPB        1 g 200 mL/hr over 30 Minutes Intravenous  Once 04/01/23 1746 04/01/23 1902           Objective: Vitals:   04/15/23 0751 04/15/23 0800 04/15/23 0809 04/15/23 1129  BP: (!) 75/53 (!) 83/56 99/75 93/65   Pulse: 65 73  95  Resp: 20 (!) 22 15 19   Temp: 98.4 F (36.9 C)   97.6 F (36.4 C)  TempSrc: Axillary   Oral  SpO2: 99% 98%  98%  Weight:      Height:        Intake/Output Summary (Last 24 hours) at 04/15/2023 1424 Last data filed at 04/15/2023 1130 Gross per 24 hour  Intake 966 ml  Output 1850 ml  Net -884 ml   Filed Weights   04/14/23 0414 04/15/23 0017 04/15/23 0441  Weight: 112.5 kg 104.8 kg 104.8 kg    Examination:  Well-developed well-nourished in no acute distress.  He is somnolent  but arouses to voices. Clear to auscultation poor inspiratory effort. Regular rate and rhythm no rubs or gallops. Soft obese nondistended bowel sounds present.   Erythema noted around sutures left groin greater than right. Oriented x 3.   Data Reviewed: I have personally reviewed following labs and imaging studies  CBC: Recent Labs  Lab 04/09/23 0053 04/10/23 1955 04/11/23 0155 04/12/23 0929 04/13/23 1100 04/14/23 0620 04/15/23 0700  WBC 15.3* 9.6 8.7 9.5 9.2 8.8 8.8  NEUTROABS 12.2* 7.4 6.8  --   --   --   --   HGB 10.1* 9.9* 10.0* 9.7* 9.4* 8.9* 8.7*  HCT 33.4* 32.2* 32.3* 31.3* 30.3* 28.7* 28.6*  MCV 76.8* 78.7* 76.4* 78.3* 77.3* 76.9* 79.7*  PLT 323 215 222 171 148* 126* 126*   Basic Metabolic Panel: Recent Labs  Lab 04/09/23 0053 04/10/23 1955 04/11/23 0155 04/12/23 0929 04/13/23 1100 04/14/23 0620 04/15/23 0700  NA 130*   < > 132* 132* 131* 130* 133*  K 3.9   < > 3.9 3.8 3.9 3.9 3.9  CL 98   < > 94* 95* 95* 95* 96*  CO2 22   < > 26 29 27 28 29   GLUCOSE 188*   < > 184* 118* 210* 174* 150*  BUN 48*   < > 37* 34* 32* 30* 27*  CREATININE 0.81   < > 0.82 0.83 0.80 0.74 0.69  CALCIUM 8.1*   < > 8.3* 7.9* 7.8* 7.7* 7.9*  MG 1.8  --   --  1.7 1.6* 2.2  --   PHOS 2.6  --   --   --   --  2.7  --    < > = values in this interval not displayed.   GFR: Estimated Creatinine Clearance: 120.8 mL/min (by C-G formula based on SCr of 0.69 mg/dL).  Liver Function Tests: Recent Labs  Lab 04/09/23 0053 04/13/23 1100 04/15/23 0700  AST 23 27 29   ALT 15 20 20   ALKPHOS 60 84 81  BILITOT 1.0 0.5 0.6  PROT 5.1* 5.0* 5.1*  ALBUMIN 2.2* 2.0* 2.0*   No results for input(s): "LIPASE", "AMYLASE" in the last 168 hours. No results for input(s): "AMMONIA" in the last 168 hours. Coagulation Profile: No results for input(s): "INR", "PROTIME" in the last 168 hours. Cardiac Enzymes: Recent Labs  Lab 04/10/23 1955  CKTOTAL 24*   BNP (last 3 results) No results for input(s):  "PROBNP" in the last 8760 hours. HbA1C: No results for input(s): "HGBA1C" in the last 72 hours. CBG: Recent Labs  Lab 04/14/23 0944 04/14/23 1154 04/14/23 1705 04/14/23 2133 04/15/23 0618  GLUCAP 167* 137* 154* 127* 146*   Lipid Profile: No results for input(s): "CHOL", "HDL", "LDLCALC", "TRIG", "CHOLHDL", "LDLDIRECT" in the last 72 hours. Thyroid Function Tests: No results for input(s): "TSH", "T4TOTAL", "FREET4", "T3FREE", "THYROIDAB" in the last 72 hours. Anemia Panel: No results for input(s): "VITAMINB12", "FOLATE", "FERRITIN", "TIBC", "IRON", "RETICCTPCT" in the last 72 hours.  Sepsis Labs: Recent Labs  Lab 04/11/23 0829  LATICACIDVEN 1.6    No results found for this or any previous visit (from the past 240 hour(s)).    Radiology Studies: No results found.  Scheduled Meds:  acetaminophen  1,000 mg Oral Q8H   ascorbic acid  500 mg Oral BID   atorvastatin  80 mg Oral Daily   Chlorhexidine Gluconate Cloth  6 each Topical Daily   feeding supplement  237 mL Oral TID BM   gabapentin  300 mg Oral QHS   hydrocortisone  5 mg Oral BID   insulin aspart  0-15 Units Subcutaneous TID WC   insulin aspart  0-5 Units Subcutaneous QHS   insulin glargine-yfgn  20 Units Subcutaneous QHS   midodrine  10 mg Oral TID with meals   nutrition supplement (JUVEN)  1 packet Oral BID BM   oxyCODONE  10 mg Oral Q12H   pantoprazole  40 mg Oral Daily   polyethylene glycol  17 g Oral BID   sodium chloride flush  10-40 mL Intracatheter Q12H   tamsulosin  0.4 mg Oral QPC supper   zinc sulfate  220 mg Oral Daily   Continuous Infusions:  sodium chloride 10 mL/hr at 04/14/23 1811   albumin human 25 g (04/15/23 0825)   DAPTOmycin (CUBICIN) 800 mg in sodium chloride 0.9 % IVPB 800 mg (04/15/23 1337)   ertapenem 1,000 mg (04/15/23 1333)     LOS: 13 days   Darlin Drop, MD [facility]  04/15/2023, 2:24 PM   *Please note that this is a verbal dictation therefore any spelling or  grammatical errors are due to the "Dragon Medical One" system interpretation.  Please page via Amion and do not message via secure chat for urgent patient care matters. Secure chat can be used for non urgent patient care matters.  How to contact the [facility] Attending or Consulting provider 7A - 7P or covering provider during after hours 7P -7A, for this patient?  Check the care team in [facility] and look for a) attending/consulting TRH provider listed and b) the [facility] team listed. Page or secure chat 7A-7P. Log into [email or web address] and use [place] universal password to access. If you do not have the password, please contact the hospital operator. Locate the [facility] provider you are looking for under [facility] and page to a number  that you can be directly reached. If you still have difficulty reaching the provider, please page the [facility] (Director on Call) for the Hospitalists listed on amion for assistance.

## 2023-04-15 NOTE — Progress Notes (Signed)
PT Cancellation Note  Patient Details Name: Peter Yang MRN: [id number] DOB: 01/13/1956   Cancelled Treatment:    Reason Eval/Treat Not Completed: (P) Pain limiting ability to participate (Pt stating his 2 groin incisions are "burning" and refuses mobility. Pt requests that PT continue to check on him. Will continue to follow per PT POC.)   [name] 04/15/2023, 4:04 PM

## 2023-04-16 LAB — GLUCOSE, CAPILLARY
Glucose-Capillary: 139 mg/dL — ABNORMAL HIGH (ref 70–99)
Glucose-Capillary: 174 mg/dL — ABNORMAL HIGH (ref 70–99)
Glucose-Capillary: 181 mg/dL — ABNORMAL HIGH (ref 70–99)
Glucose-Capillary: 136 mg/dL — ABNORMAL HIGH (ref 70–99)

## 2023-04-16 MED ORDER — OXYCODONE HCL ER 15 MG PO T12A
15.0000 mg | EXTENDED_RELEASE_TABLET | Freq: Two times a day (BID) | ORAL | Status: DC
  Administered 2023-04-16 – 2023-04-18 (×4): 15 mg via ORAL
  Filled 2023-04-16 (×4): qty 1

## 2023-04-16 NOTE — Plan of Care (Signed)
Plan of care is reviewed.  Problem: Coping: DNR and alone, poor family support system.  Goal: Ability to adjust to condition or change in health will improve Outcome: Not Progressing: Pt is likely withdrawn, blunted and flat affect, more sleepy and drowsy, but cooperative with care. Pt has generalized pain and surgical pain, and requires frequent pain medication. Palliative care had already discussed with Pt with the plan of care for comfort care.    Problem: Fluid Volume: Goal: Ability to maintain a balanced intake and output will improve Outcome: Progressing>> continue to have hematuria through foley catheter. Frequent foley care and peri-care per protocol.    Problem: Metabolic: Goal: Ability to maintain appropriate glucose levels will improve Outcome: Progressing checking blood glucose ACSH with short acting insulin sliding scale and long acting insulin at bed time.    Problem: Nutritional: Goal: Maintenance of adequate nutrition will improve Outcome: Progressing: good oral intake. Pt stated he likes the hospital food.  Goal: Progress toward achieving an optimal weight will improve Outcome: Progressing   Problem: Skin Integrity: multiple wounds. Goal: Risk for impaired skin integrity will decrease Outcome: Progressing; scrotal incision is pink, painful and swelling. Penrose drains have purulent drainage, frequently changing ABD pad and clean. Right food toe amputation dressing changed daily per order. Mid sacral pressure ulcer wound was cleaned and foam dressing changed daily. Pt has on air mattress with position changed q 2 hs.    Problem: Clinical Measurements: Goal: Cardiovascular complication will be avoided Outcome: Progressing: stable BP 89/65- 101/69 mmHg, HR 80s, NSR on the monitor. On Midodrine 10 mg TID.    Problem: Clinical Measurements: Goal: Respiratory complications will improve Outcome: Progressing; on room air, no respiratory distress.    Problem: Clinical  Measurements: Goal: Will remain free from infection Outcome: Progressing; on long term antibiotics with daptomycin and Ertapenem daily. Pt remains afebrile.   Filiberto Pinks, RN

## 2023-04-16 NOTE — Progress Notes (Signed)
Nutrition Follow-up  DOCUMENTATION CODES:   Not applicable  INTERVENTION:  D/c juven   Continue Ensure Plus High Protein po TID, each supplement provides 350 kcal and 20 grams of protein.  Encourage po intake   NUTRITION DIAGNOSIS:   Increased nutrient needs related to wound healing as evidenced by estimated needs. -ongoing   GOAL:   Patient will meet greater than or equal to 90% of their needs -progressing with po intake and ONS intake   MONITOR:   PO intake, Supplement acceptance  REASON FOR ASSESSMENT:   Malnutrition Screening Tool    ASSESSMENT:   67 y.o. male admits related to urinary retention. PMH includes: L BKA, chronic systolic heart failure, HTN, T2DM. Pt is currently receiving medical management related to UTI.  Visited patient at bedside who reports a fair appetite and that he is constipated but "working on it". Patient denies N/V/D. He has been tolerating Ensure BID and gave flavor preferences. Patient reports he cannot tolerate Juven and wishes for it to be d/c'd.   He reported not drinking any Ensure today and was agreeable to RD providing him one at bedside which he immediately started to drink.  Labs: Na 132, Glu 120, BUN 26, vitamin A 10.7, Vitamin D 28.96, Hgb 8.7, A1c 7 Meds: vitamin c, Ensure TID, insulin, Juven BID, oxycontin, protonix, miralax, NS, zinc sulfate Wt: admit wt- 225#; Current BW 244#   PO: 80% avg meal intake x last 8 documented meals  I/O's:  -9.75 L since admission   GOC ongoing   Diet Order:   Diet Order             Diet regular Room service appropriate? Yes with Assist; Fluid consistency: Thin  Diet effective now                   EDUCATION NEEDS:   Not appropriate for education at this time  Skin:  Skin Assessment: Skin Integrity Issues: Skin Integrity Issues:: Stage II Stage II: Mid buttocks  Last BM:  6/25  Height:   Ht Readings from Last 1 Encounters:  04/02/23 6\' 4"  (1.93 m)    Weight:   Wt  Readings from Last 1 Encounters:  04/16/23 110.7 kg    Ideal Body Weight:     BMI:  Body mass index is 29.7 kg/m.  Estimated Nutritional Needs:   Kcal:  2450-2650 kcals  Protein:  125-150 grams  Fluid:  >/= 2.4L or per MD   Leodis Rains, RDN, LDN  Clinical Nutrition

## 2023-04-16 NOTE — TOC Progression Note (Signed)
Transition of Care [facility]) - Progression Note    Patient Details  Name: Zamire Whitehurst MRN: [id number] Date of Birth: [birth year]  [facility] CM/SW Contact  Eduard Roux, [place] Phone Number: 04/16/2023, 3:53 PM  Clinical Narrative:     [name] has agreed for patient to return but has requested CSW to reach out to Financial Counselors to start medicaid application, especially since PC is involved and is discussing GOC. Patient has been at [name] since 04/11 and has used a lot of his SNF days and currently co-pay status.   TOC will send message to [facility] tomorrow  TOC will continue to follow and assist with discharge planning.  Antony Blackbird, MSW, LCSW Clinical Social Worker    Expected Discharge Plan: Skilled Nursing Facility Barriers to Discharge: Continued Medical Work up  Expected Discharge Plan and Services   Discharge Planning Services: CM Consult Post Acute Care Choice: Skilled Nursing Facility Living arrangements for the past 2 months: Skilled Nursing Facility                                       Social Determinants of Health (SDOH) Interventions SDOH Screenings   Food Insecurity: No Food Insecurity (04/02/2023)  Housing: Low Risk  (04/02/2023)  Transportation Needs: No Transportation Needs (04/02/2023)  Utilities: Not At Risk (04/02/2023)  Tobacco Use: Low Risk  (04/10/2023)    Readmission Risk Interventions    10/24/2022   11:21 AM 09/02/2022   10:36 AM  Readmission Risk Prevention Plan  Transportation Screening Complete Complete  Medication Review Oceanographer) Complete Complete  PCP or Specialist appointment within 3-5 days of discharge Complete Complete  HRI or Home Care Consult Complete Complete  SW Recovery Care/Counseling Consult Complete Complete  Palliative Care Screening Not Applicable Not Applicable  Skilled Nursing Facility Complete Complete

## 2023-04-16 NOTE — Progress Notes (Signed)
Daily Progress Note   Patient Name: Peter Yang       Date: 04/16/2023 DOB: 02/20/1956  Age: 67 y.o. MRN#: [id number] Attending Physician: Joseph Art, DO Primary Care Physician: Annita Brod, MD Admit Date: 04/01/2023  Reason for Consultation/Follow-up: Establishing goals of care  Subjective: Medical records reviewed including progress notes, labs, and imaging. Patient assessed at the bedside. He reports continued pain, worse than yesterday. He has required 4 doses of PRN oxycodone in the past 24 hours, although this is equivalent to his 2 missed doses of scheduled oxycontin.   Created space and opportunity for patient's thoughts and feelings on his current illness. He is still undecided about comfort care. He was able to speak with his friend yesterday and even another friend that he has not heard from in several months. This has not really helped him come to a decision. He does not have anything else he wishes to discuss in order to navigate his care preferences. Offered to give him more time to reflect and encouraged to reach out to PMT via team line or request to RN if additional support would be beneficial.   Questions and concerns addressed. PMT will continue to support holistically.   Length of Stay: 14   Physical Exam Vitals and nursing note reviewed.  Constitutional:      General: He is not in acute distress.    Appearance: He is ill-appearing.  Cardiovascular:     Rate and Rhythm: Normal rate.  Pulmonary:     Effort: Pulmonary effort is normal. No respiratory distress.  Neurological:     Mental Status: He is alert. Mental status is at baseline.  Psychiatric:        Mood and Affect: Affect is blunt.        Behavior: Behavior normal.             Vital Signs: BP  94/66 (BP Location: Left Arm)   Pulse 80   Temp 98 F (36.7 C) (Oral)   Resp 16   Ht 6\' 4"  (1.93 m)   Wt 110.7 kg   SpO2 96%   BMI 29.70 kg/m  SpO2: SpO2: 96 % O2 Device: O2 Device: Room Air O2 Flow Rate: O2 Flow Rate (L/min): 0 L/min  Palliative Care Assessment & Plan   Patient Profile: 67 y.o.  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     Patient had acute urinary tension on Saturday, June 8.  He had a Foley catheter placed at that time.  He started developing pain in his penis yesterday.  He was sent to the ER for evaluation.   Patient faces treatment option decisions, advanced directives and anticipatory care needs.   Assessment: Goals of care conversation UTI due to indwelling catheter Acute on chronic combined CHF Hypoalbuminemia Fournier's gangrene status post left BKA  Recommendations/Plan: Continue DNR/DNI Continue current care Patient remains undecided on transition to comfort focused care Increased dose of Oxycontin from 10mg  to 15mg  BID Psychosocial and emotional support provided PMT will continue to follow intermittently   Prognosis: Poor long-term prognosis  Discharge Planning: To Be Determined  Care plan was discussed with patient, RN   MDM high         Kyrsten Deleeuw Jeni Salles, PA-C  Palliative Medicine Team Team phone # [phone]  Thank you for allowing the Palliative Medicine Team to assist in the care of this patient. Please utilize secure chat with additional questions, if there is no response within 30 minutes please call the above phone number.  Palliative Medicine Team providers are available by phone from 7am  to 7pm daily and can be reached through the team cell phone.  Should this patient require assistance outside of these hours, please call the patient's attending physician.

## 2023-04-16 NOTE — Progress Notes (Signed)
Occupational Therapy Treatment Patient Details Name: Peter Yang MRN: [id number] DOB: 06/24/1956 Today's Date: 04/16/2023   History of present illness 67 yo male with onset of foley cath collection of pus and blood was admitted on 6/11, received foley cath and then pain worsened.  Pt is admitted for UTI with ESBL e coli.  PMHx:  CHF, L BKA, CAD, HLD, DM, BPM,  NSTEMI   OT comments  Pt continues to be limited by pain in groin. Declined assist to sit EOB, mobility limited to rolling with mod assist. Completed exercise with L UE within pain tolerance of shoulder. Patient will benefit from continued inpatient follow up therapy, <3 hours/day.   Recommendations for follow up therapy are one component of a multi-disciplinary discharge planning process, led by the attending physician.  Recommendations may be updated based on patient status, additional functional criteria and insurance authorization.    Assistance Recommended at Discharge Frequent or constant Supervision/Assistance  Patient can return home with the following  Two people to help with bathing/dressing/bathroom;Assistance with cooking/housework;Direct supervision/assist for medications management;Direct supervision/assist for financial management;Assist for transportation;Help with stairs or ramp for entrance   Equipment Recommendations  None recommended by OT    Recommendations for Other Services      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 Restrictions Weight Bearing Restrictions: Yes RLE Weight Bearing: Weight bearing as tolerated Other Position/Activity Restrictions: has prosthetic for LLE but has not used since Oct.       Mobility Bed Mobility Overal bed mobility: Needs Assistance Bed Mobility: Rolling Rolling: Mod assist         General bed mobility comments: Pt able to use bedrail to roll R and L with mod A with bedpad. pt able to pull self  up to longsit using bedrail with min A. Pt refuses to sit EOB despite encouragement.    Transfers                   General transfer comment: refused more than rolling side to side     Balance                                           ADL either performed or assessed with clinical judgement   ADL Overall ADL's : Needs assistance/impaired         Upper Body Bathing: Moderate assistance;Bed level Upper Body Bathing Details (indicate cue type and reason): back                                Extremity/Trunk Assessment              Vision       Perception     Praxis      Cognition Arousal/Alertness: Awake/alert Behavior During Therapy: Flat affect Overall Cognitive Status: History of cognitive impairments - at baseline                                 General Comments: poor awareness of deficits and rehab potential        Exercises Other Exercises Other Exercises: AAROM L shoulder, strengthening elbow to hand against manual resistance    Shoulder Instructions       General Comments Pt  refused to sit EOB    Pertinent Vitals/ Pain       Pain Assessment Pain Assessment: Faces Faces Pain Scale: Hurts even more Pain Location: groin incisions, back Pain Intervention(s): Monitored during session, Repositioned, Premedicated before session  Home Living                                          Prior Functioning/Environment              Frequency  Min 1X/week        Progress Toward Goals  OT Goals(current goals can now be found in the care plan section)  Progress towards OT goals: Not progressing toward goals - comment  Acute Rehab OT Goals OT Goal Formulation: Patient unable to participate in goal setting Time For Goal Achievement: 04/23/23 Potential to Achieve Goals: Fair  Plan Discharge plan remains appropriate    Co-evaluation    PT/OT/SLP Co-Evaluation/Treatment:  Yes Reason for Co-Treatment: For patient/therapist safety PT goals addressed during session: Mobility/safety with mobility;Strengthening/ROM OT goals addressed during session: Strengthening/ROM      AM-PAC OT "6 Clicks" Daily Activity     Outcome Measure   Help from another person eating meals?: A Little Help from another person taking care of personal grooming?: A Little Help from another person toileting, which includes using toliet, bedpan, or urinal?: Total Help from another person bathing (including washing, rinsing, drying)?: A Lot Help from another person to put on and taking off regular upper body clothing?: A Lot Help from another person to put on and taking off regular lower body clothing?: Total 6 Click Score: 12    End of Session    OT Visit Diagnosis: Muscle weakness (generalized) (M62.81);Other symptoms and signs involving cognitive function;Pain   Activity Tolerance Patient limited by pain   Patient Left in bed;with call bell/phone within reach   Nurse Communication          Time: 2952-8413 OT Time Calculation (min): 27 min  Charges: OT General Charges $OT Visit: 1 Visit OT Treatments $Therapeutic Activity: 8-22 mins  Berna Spare, OTR/L Acute Rehabilitation Services Office: [phone]   Evern Bio 04/16/2023, 3:10 PM

## 2023-04-16 NOTE — Progress Notes (Signed)
Physical Therapy Treatment Patient Details Name: Peter Yang MRN: [id number] DOB: 04-19-1956 Today's Date: 04/16/2023   History of Present Illness 67 yo male with onset of foley cath collection of pus and blood was admitted on 6/11, received foley cath and then pain worsened.  Pt is admitted for UTI with ESBL e coli.  PMHx:  CHF, L BKA, CAD, HLD, DM, BPM,  NSTEMI    PT Comments    Pt received in supine and agreeable to limited mobility in the bed. Pt refusing to sit EOB due to groin incision pain. Pt noted to have B knee contractures (L>R) and education provided on resting B knees in extension with no pillow underneath. Pt able to perform SLR with RLE to reposition pillow under ankle. Pt with complaints of back pain and is able to assist with turning L and R to place pillows. Pt requiring mod A with bedpads to complete rolls due to pain. Pt demonstrating good BUE and RLE strength with mobility and exercises, however continues to be limited by pain. Pt stating that his goals are to walk and go to the grocery store. Pt also reporting being able to use LLE prosthesis for ambulation, however pt educated on inability to use a prosthesis with his significant L knee contracture and need to regain extension. Pt continues to benefit from PT services to progress toward functional mobility goals.      Recommendations for follow up therapy are one component of a multi-disciplinary discharge planning process, led by the attending physician.  Recommendations may be updated based on patient status, additional functional criteria and insurance authorization.     Assistance Recommended at Discharge Frequent or constant Supervision/Assistance  Patient can return home with the following Two people to help with walking and/or transfers;Two people to help with bathing/dressing/bathroom;Assistance with feeding;Direct supervision/assist for medications management;Direct supervision/assist for financial management;Assist  for transportation;Help with stairs or ramp for entrance   Equipment Recommendations  None recommended by PT    Recommendations for Other Services       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 Restrictions Weight Bearing Restrictions: Yes RLE Weight Bearing: Weight bearing as tolerated Other Position/Activity Restrictions: has prosthetic for LLE but has not used since Oct.     Mobility  Bed Mobility Overal bed mobility: Needs Assistance Bed Mobility: Rolling Rolling: Mod assist         General bed mobility comments: Pt able to use bedrail to roll R and L with mod A with bedpad. pt able to pull self up to longsit using bedrail with min A. Pt refuses to sit EOB despite encouragement.        Balance                                            Cognition Arousal/Alertness: Awake/alert Behavior During Therapy: Flat affect Overall Cognitive Status: History of cognitive impairments - at baseline                                 General Comments: Pt unwilling to sit EOB due to pain, however is able to roll and move RLE without appearance of significant pain.        Exercises Other Exercises Other Exercises: SLR x2 reps    General Comments General  comments (skin integrity, edema, etc.): Pt refused to sit EOB      Pertinent Vitals/Pain Pain Assessment Pain Assessment: Faces Faces Pain Scale: Hurts even more Pain Location: groin incisions, back Pain Descriptors / Indicators: Burning, Sore Pain Intervention(s): Monitored during session, Repositioned     PT Goals (current goals can now be found in the care plan section) Acute Rehab PT Goals Patient Stated Goal: none stated PT Goal Formulation: Patient unable to participate in goal setting Time For Goal Achievement: 04/22/23 Potential to Achieve Goals: Fair Progress towards PT goals: Not progressing toward goals -  comment (Pain limiting pt's participation)    Frequency    Min 1X/week      PT Plan Current plan remains appropriate    Co-evaluation PT/OT/SLP Co-Evaluation/Treatment: Yes Reason for Co-Treatment: For patient/therapist safety;To address functional/ADL transfers PT goals addressed during session: Mobility/safety with mobility;Strengthening/ROM        AM-PAC PT "6 Clicks" Mobility   Outcome Measure  Help needed turning from your back to your side while in a flat bed without using bedrails?: Total Help needed moving from lying on your back to sitting on the side of a flat bed without using bedrails?: Total Help needed moving to and from a bed to a chair (including a wheelchair)?: Total Help needed standing up from a chair using your arms (e.g., wheelchair or bedside chair)?: Total Help needed to walk in hospital room?: Total Help needed climbing 3-5 steps with a railing? : Total 6 Click Score: 6    End of Session   Activity Tolerance: Patient limited by pain Patient left: in bed;with call bell/phone within reach Nurse Communication: Mobility status PT Visit Diagnosis: Muscle weakness (generalized) (M62.81);Difficulty in walking, not elsewhere classified (R26.2)     Time: 1610-9604 PT Time Calculation (min) (ACUTE ONLY): 27 min  Charges:  $Therapeutic Activity: 8-22 mins                     [name], PTA Acute Rehabilitation Services Secure Chat Preferred  [phone]    [name] 04/16/2023, 1:47 PM

## 2023-04-16 NOTE — Progress Notes (Signed)
PROGRESS NOTE    Peter Yang  ZOX:[id number] DOB: 07/05/1956 DOA: 04/01/2023 PCP: Annita Brod, MD   Brief Narrative:  67 year old male with a history of left below-knee amputation, chronic combined diastolic and systolic CHF with LVEF 35% and grade 2 diastolic dysfunction, paroxysmal A-fib on Eliquis, recent admission in April 2024 at [facility] for right diabetic foot ulcer status post fifth ray amputation, hypertension, type 2 diabetes who presents to the ER from [place] nursing home due to pus and blood from his Foley catheter.  Patient had acute urinary retention on Saturday, June 8.  He had a Foley catheter placed at that time.  CT scan showed findings c/w fournier's gangrene.  Seen by urology, status post I&D on 04/02/23.  Returned to OR 04/09/23 for wound closure.   Cardiology has been consulted with concern for heart failure/volume overload, but they noted his poor cardiovascular prognosis and did not suspect he was in decompensated heart failure.  At this time, on antibiotics per ID (plan for 6 weeks after last surgical procedure).  Seen by palliative care for goals of care discussions.      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 Hypoalbuminemia  Anasarca  Echo done on 04/07/2023 with EF 35%, grade II diastolic dysfunction, mildly reduce RVSF, RV size moderately enlarged, severe MV regurg. Volume overloaded on exam with dependent edema on bilateral thighs. Albumin is 2.2. CXR with increased density in both lower lung fields, bilateral effusions, possible atelectasis/pneumonia (clinically not c/w pneumonia) Cardiology notes limited cardiovascular options, but did not think he was in decompensated HF. Note poor cardiovascular prognosis.  See 6/16 note.  Palliative care consulted.   IV Lasix stopped 04/15/2023 due to hypotension Continue strict I's and O's and daily weight Net I&O -18.6 L  Hypotension Continue to hold off any blood pressure medications IV Lasix DC'd on 04/15/2023 6/25:  500 cc IV fluid bolus NS and IV albumin 25 g twice daily every 6 hours x 2 doses.   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  No evidence of osteo.  Diffuse myositis throughout pelvic and thigh musculature.  Underwent wound washout and closure with exchange of the Foley catheter by Dr. Marlou Porch 04/09/2023.  No further surgical plans by urology.  PICC line placed 04/10/2023.   -ID discontinued Merrem and linezolid and started on ertapenem and daptomycin on 04/10/2023 with the plan to continue both antibiotics for 6 weeks  with EOT on 05/20/2023. Stop SGLT2 at discharge, added to "allergies/contraindications" list Urology following, appreciate assistance. Eliquis held on 04/15/2023 due to gross hematuria, resume Eliquis once hematuria has resolved.  Acute blood loss anemia in the  setting of gross hematuria -labs not ordered for today  Long term current use of systemic steroids Unclear why he was on cortef on admission.  Was on 10 mg Solu-Cortef in the morning and 5 at night. Appears to have started in October/November 2023.I think these were started due to his chronic hypotension. was started to wean 04/09/2023 and was stopped 6/20; however, given how long he has been on this medication will continue at least 5 mg BID and then taper.  Blood pressure was low, midodrine was increased from 5 mg to 10 mg TID. Ideally he would not be on this medication as he has no notable symptoms, and no evidence of disruption of end organ perfusion.  -taper over weeks  Currently on midodrine 10 mg 3 times daily Continue to closely monitor vital signs.  Improving hypervolemic Hyponatremia Serum sodium 133 from 130.  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 Continue local wound care.   S/P BKA (below knee amputation) unilateral, left (HCC)    Type 2 diabetes mellitus with diabetic peripheral angiopathy without gangrene, without long-term current use of insulin (HCC) Continue with SSI and 20 units of lantus nightly.     History of urinary retention Foley catheter placed on Saturday 03-29-2023.  - Flomax, he underwent Foley exchange 04/09/2023.  PAF (paroxysmal atrial fibrillation) (HCC) on Eliquis Currently not on rate control agent.  Rate controlled.   Essential hypertension Stable. Has hx of orthostatic hypotension. Vital signs are currently stable, on midodrine 10 mg 3 times daily Continue to monitor vital signs  Prostate Cancer Follow outpatient with urology  Goals of care Currently DNR Palliative care following -- poor overall  prognosis    DVT prophylaxis: Place and maintain sequential compression device Start: 04/15/23 1444 SCDs Start: 04/02/23 0044 SCDs on 04/15/2023, Home Eliquis held due to gross hematuria, resume once gross hematuria stops.   Code Status: DNR  Family Communication: None present at bedside.    Time: 50 minutes.  Status is: Inpatient Remains inpatient appropriate because: Patient is now medically stable, he came from [facility], per [facility] note from yesterday, due to pending bills, facility is not willing to accept him back.  TOC is working on finding a place for him.   Estimated body mass index is 29.7 kg/m as calculated from the following:   Height as of this encounter: 6\' 4"  (1.93 m).   Weight as of this encounter: 110.7 kg.   Nutritional Assessment: Body mass index is 29.7 kg/m.[name] Seen by dietician.  I agree with the assessment and plan as outlined below: Nutrition Status: Nutrition Problem: Increased nutrient needs Etiology: wound healing Signs/Symptoms: estimated needs Interventions: Premier Protein, Juven     Consultants:  Urology  ID Palliative care      Subjective: Eating breakfast, NAD  Objective: Vitals:   04/16/23 0310 04/16/23 0418 04/16/23 0737 04/16/23 1126  BP:  101/69 94/66 101/77  Pulse:  87 80 87  Resp:  16 16 18   Temp:  97.7 F (36.5 C) 98 F (36.7 C) 98.3 F (36.8 C)  TempSrc:  Oral  Oral Oral  SpO2:  98% 96% 91%  Weight: 110.7 kg     Height:        Intake/Output Summary (Last 24 hours) at 04/16/2023 1134 Last data filed at 04/16/2023 1130 Gross per 24 hour  Intake 920 ml  Output 2180 ml  Net -1260 ml   Filed Weights   04/15/23 0017 04/15/23 0441 04/16/23 0310  Weight: 104.8 kg 104.8 kg 110.7 kg    Examination:   General: Appearance:     Overweight male in no acute distress     Lungs:      respirations unlabored  Heart:    Normal heart rate.   MS:   Below knee amputation of left lower extremity is noted.   Neurologic:    Awake, alert, oriented x 3. No apparent focal neurological           defect.       Data Reviewed: I have personally reviewed following labs and imaging studies  CBC: Recent Labs  Lab 04/10/23 1955 04/11/23 0155 04/12/23 0929 04/13/23 1100 04/14/23 0620 04/15/23 0700  WBC 9.6 8.7 9.5 9.2 8.8 8.8  NEUTROABS 7.4 6.8  --   --   --   --   HGB 9.9* 10.0* 9.7* 9.4* 8.9* 8.7*  HCT 32.2* 32.3* 31.3* 30.3* 28.7* 28.6*  MCV 78.7* 76.4* 78.3* 77.3* 76.9* 79.7*  PLT 215 222 171 148* 126* 126*   Basic Metabolic Panel: Recent Labs  Lab 04/11/23 0155 04/12/23 0929 04/13/23 1100 04/14/23 0620 04/15/23 0700  NA 132* 132* 131* 130* 133*  K 3.9 3.8 3.9 3.9 3.9  CL 94* 95* 95* 95* 96*  CO2 26 29 27 28 29   GLUCOSE 184* 118* 210* 174* 150*  BUN 37* 34* 32* 30* 27*  CREATININE 0.82 0.83 0.80 0.74 0.69  CALCIUM 8.3* 7.9* 7.8* 7.7* 7.9*  MG  --  1.7 1.6* 2.2  --   PHOS  --   --   --  2.7  --    GFR: Estimated Creatinine Clearance: 123.8 mL/min (by C-G formula based on SCr of 0.69 mg/dL). Liver Function Tests: Recent Labs  Lab 04/13/23 1100 04/15/23 0700  AST 27 29  ALT 20 20  ALKPHOS 84 81  BILITOT 0.5 0.6  PROT 5.0* 5.1*  ALBUMIN 2.0* 2.0*   No results for input(s): "LIPASE", "AMYLASE" in the last 168 hours. No results for input(s): "AMMONIA" in the last 168 hours. Coagulation Profile: No results for input(s): "INR", "PROTIME" in the last 168 hours. Cardiac Enzymes: Recent Labs  Lab 04/10/23 1955  CKTOTAL 24*   BNP (last 3 results) No results for input(s): "PROBNP" in the last 8760 hours. HbA1C: No results for input(s): "HGBA1C" in the last 72 hours. CBG: Recent Labs  Lab 04/15/23 1133 04/15/23 1607 04/15/23 2108 04/16/23 0633 04/16/23 1128  GLUCAP 141* 140* 162* 139* 174*   Lipid Profile: No results for input(s): "CHOL", "HDL", "LDLCALC", "TRIG", "CHOLHDL", "LDLDIRECT" in the last 72 hours. Thyroid Function Tests: No results for input(s): "TSH", "T4TOTAL",  "FREET4", "T3FREE", "THYROIDAB" in the last 72 hours. Anemia Panel: No results for input(s): "VITAMINB12", "FOLATE", "FERRITIN", "TIBC", "IRON", "RETICCTPCT" in the last 72 hours.  Sepsis Labs: Recent Labs  Lab 04/11/23 0829  LATICACIDVEN 1.6    No results found for this or any previous visit (from the past 240 hour(s)).    Radiology Studies: No results found.  Scheduled Meds:  acetaminophen  1,000 mg Oral Q8H   ascorbic acid  500 mg  Oral BID   atorvastatin  80 mg Oral Daily   Chlorhexidine Gluconate Cloth  6 each Topical Daily   feeding supplement  237 mL Oral TID BM   gabapentin  300 mg Oral QHS   hydrocortisone  5 mg Oral BID   insulin aspart  0-15 Units Subcutaneous TID WC   insulin aspart  0-5 Units Subcutaneous QHS   insulin glargine-yfgn  20 Units Subcutaneous QHS   midodrine  10 mg Oral TID with meals   nutrition supplement (JUVEN)  1 packet Oral BID BM   oxyCODONE  15 mg Oral Q12H   pantoprazole  40 mg Oral Daily   polyethylene glycol  17 g Oral BID   sodium chloride flush  10-40 mL Intracatheter Q12H   tamsulosin  0.4 mg Oral QPC supper   zinc sulfate  220 mg Oral Daily   Continuous Infusions:  sodium chloride 10 mL/hr at 04/14/23 1811   DAPTOmycin (CUBICIN) 800 mg in sodium chloride 0.9 % IVPB 800 mg (04/15/23 1337)   ertapenem 1,000 mg (04/15/23 1333)     LOS: 14 days   Joseph Art, DO [facility]  04/16/2023, 11:34 AM     How to contact the [facility] Attending or Consulting provider 7A - 7P or covering provider during after hours 7P -7A, for this patient?  Check the care team in [facility] and look for a) attending/consulting TRH provider listed and b) the [facility] team listed. Page or secure chat 7A-7P. Log into [email or web address] and use [place]'s universal password to access. If you do not have the password, please contact the hospital operator. Locate the [facility] provider you are looking for under [facility] and page to a number that you can be  directly reached. If you still have difficulty reaching the provider, please page the [facility] (Director on Call) for the Hospitalists listed on amion for assistance.

## 2023-04-17 DIAGNOSIS — N3001 Acute cystitis with hematuria: Secondary | ICD-10-CM

## 2023-04-17 DIAGNOSIS — M7989 Other specified soft tissue disorders: Secondary | ICD-10-CM

## 2023-04-17 LAB — CBC
HCT: 26 % — ABNORMAL LOW (ref 39.0–52.0)
Hemoglobin: 8 g/dL — ABNORMAL LOW (ref 13.0–17.0)
MCH: 24.2 pg — ABNORMAL LOW (ref 26.0–34.0)
MCHC: 30.8 g/dL (ref 30.0–36.0)
MCV: 78.5 fL — ABNORMAL LOW (ref 80.0–100.0)
Platelets: 113 10*3/uL — ABNORMAL LOW (ref 150–400)
RBC: 3.31 MIL/uL — ABNORMAL LOW (ref 4.22–5.81)
RDW: 19.9 % — ABNORMAL HIGH (ref 11.5–15.5)
WBC: 6.8 10*3/uL (ref 4.0–10.5)
nRBC: 0 % (ref 0.0–0.2)

## 2023-04-17 LAB — BASIC METABOLIC PANEL
Anion gap: 8 (ref 5–15)
BUN: 26 mg/dL — ABNORMAL HIGH (ref 8–23)
CO2: 28 mmol/L (ref 22–32)
Calcium: 8.3 mg/dL — ABNORMAL LOW (ref 8.9–10.3)
Chloride: 96 mmol/L — ABNORMAL LOW (ref 98–111)
Creatinine, Ser: 0.82 mg/dL (ref 0.61–1.24)
GFR, Estimated: >60 mL/min (ref >60)
Glucose, Bld: 120 mg/dL — ABNORMAL HIGH (ref 70–99)
Potassium: 3.9 mmol/L (ref 3.5–5.1)
Sodium: 132 mmol/L — ABNORMAL LOW (ref 135–145)

## 2023-04-17 LAB — CK: Total CK: 27 U/L — ABNORMAL LOW (ref 49–397)

## 2023-04-17 LAB — GLUCOSE, CAPILLARY
Glucose-Capillary: 117 mg/dL — ABNORMAL HIGH (ref 70–99)
Glucose-Capillary: 206 mg/dL — ABNORMAL HIGH (ref 70–99)
Glucose-Capillary: 109 mg/dL — ABNORMAL HIGH (ref 70–99)
Glucose-Capillary: 108 mg/dL — ABNORMAL HIGH (ref 70–99)

## 2023-04-17 MED ORDER — ATORVASTATIN CALCIUM 40 MG PO TABS
40.0000 mg | ORAL_TABLET | Freq: Every day | ORAL | Status: DC
  Administered 2023-04-18: 40 mg via ORAL
  Filled 2023-04-17: qty 1

## 2023-04-17 MED ORDER — SENNA 8.6 MG PO TABS: 1.0000 | ORAL_TABLET | Freq: Every day | ORAL | Status: DC | PRN

## 2023-04-17 MED ORDER — DOCUSATE SODIUM 100 MG PO CAPS
100.0000 mg | ORAL_CAPSULE | Freq: Two times a day (BID) | ORAL | Status: DC
  Administered 2023-04-17 (×2): 100 mg via ORAL
  Filled 2023-04-17 (×3): qty 1

## 2023-04-17 NOTE — Progress Notes (Signed)
PROGRESS NOTE    Peter Yang  WUJ:[id number] DOB: 1955-12-07 DOA: 04/01/2023 PCP: Annita Brod, MD   Brief Narrative:  67 year old male with a history of left below-knee amputation, chronic combined diastolic and systolic CHF with LVEF 35% and grade 2 diastolic dysfunction, paroxysmal A-fib on Eliquis, recent admission in April 2024 at [facility] for right diabetic foot ulcer status post fifth ray amputation, hypertension, type 2 diabetes who presents to the ER from [place] nursing home due to pus and blood from his Foley catheter.  Patient had acute urinary retention on Saturday, June 8.  He had a Foley catheter placed at that time.  CT scan showed findings c/w fournier's gangrene.  Seen by urology, status post I&D on 04/02/23.  Returned to OR 04/09/23 for wound closure.   Cardiology has been consulted with concern for heart failure/volume overload, but they noted his poor cardiovascular prognosis and did not suspect he was in decompensated heart failure.  At this time, on antibiotics per ID (plan for 6 weeks after last surgical procedure).  Seen by palliative care for goals of care discussions.      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 Hypoalbuminemia  Anasarca  Echo done on 04/07/2023 with EF 35%, grade II diastolic dysfunction, mildly reduce RVSF, RV size moderately enlarged, severe MV regurg. Volume overloaded on exam with dependent edema on bilateral thighs. Albumin is 2.2. CXR with increased density in both lower lung fields, bilateral effusions, possible atelectasis/pneumonia (clinically not c/w pneumonia) Cardiology notes limited cardiovascular options, but did not think he was in decompensated HF. Note poor cardiovascular prognosis.  See 6/16 note.  Palliative care consulted.   IV Lasix stopped 04/15/2023 due to hypotension Continue strict I's and O's and daily weight Net I&O -18.6 L  Hypotension Continue to hold off any blood pressure medications IV Lasix DC'd on 04/15/2023 6/25:  500 cc IV fluid bolus NS and IV albumin 25 g twice daily every 6 hours x 2 doses.   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  No evidence of osteo.  Diffuse myositis throughout pelvic and thigh musculature.  Underwent wound washout and closure with exchange of the Foley catheter by Dr. Marlou Porch 04/09/2023.  No further surgical plans by urology.  PICC line placed 04/10/2023.   -ID discontinued Merrem and linezolid and started on ertapenem and daptomycin on 04/10/2023 with the plan to continue both antibiotics for 6 weeks  with EOT on 05/20/2023. Stop SGLT2 at discharge, added to "allergies/contraindications" list Urology following, appreciate assistance. Eliquis held on 04/15/2023 due to gross hematuria, resume Eliquis once hematuria has resolved.  Acute blood loss anemia in the  setting of gross hematuria -trend CBC  Long term current use of systemic steroids Unclear why he was on cortef on admission.  Was on 10 mg Solu-Cortef in the morning and 5 at night. Appears to have started in October/November 2023.I think these were started due to his chronic hypotension. was started to wean 04/09/2023 and was stopped 6/20; however, given how long he has been on this medication will continue at least 5 mg BID and then taper.  Blood pressure was low, midodrine was increased from 5 mg to 10 mg TID. Ideally he would not be on this medication as he has no notable symptoms, and no evidence of disruption of end organ perfusion.  -taper over weeks  Currently on midodrine 10 mg 3 times daily Continue to closely monitor vital signs.  Improving hypervolemic Hyponatremia Serum sodium 133 from 130.  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 Continue local wound care.   S/P BKA (below knee amputation) unilateral, left (HCC)    Type 2 diabetes mellitus with diabetic peripheral angiopathy without gangrene, without long-term current use of insulin (HCC) Continue with SSI and 20 units of lantus nightly.     History of urinary retention Foley catheter placed on Saturday 03-29-2023.  - Flomax, he underwent Foley exchange 04/09/2023.  PAF (paroxysmal atrial fibrillation) (HCC) on Eliquis Currently not on rate control agent.  Rate controlled.   Essential hypertension Stable. Has hx of orthostatic hypotension. Vital signs are currently stable, on midodrine 10 mg 3 times daily Continue to monitor vital signs  Prostate Cancer Follow outpatient with urology  Goals of care Currently DNR Palliative care following -- poor overall prognosis    DVT  prophylaxis: Place and maintain sequential compression device Start: 04/15/23 1444 SCDs Start: 04/02/23 0044 SCDs on 04/15/2023, Home Eliquis held due to gross hematuria, resume once gross hematuria stops.   Code Status: DNR  Family Communication: None present at bedside.    Time: 50 minutes.  Status is: Inpatient Remains inpatient appropriate-- SNF in AM if CBC stable   Estimated body mass index is 29.7 kg/m as calculated from the following:   Height as of this encounter: 6\' 4"  (1.93 m).   Weight as of this encounter: 110.7 kg.   Nutritional Assessment: Body mass index is 29.7 kg/m.[name] Seen by dietician.  I agree with the assessment and plan as outlined below: Nutrition Status: Nutrition Problem: Increased nutrient needs Etiology: wound healing Signs/Symptoms: estimated needs Interventions: Premier Protein, Juven     Consultants:  Urology  ID Palliative care      Subjective: No overnight events  Objective: Vitals:   04/16/23 1640 04/16/23 1952 04/17/23 0442 04/17/23 0951  BP: 103/72 106/73 91/66 100/66  Pulse: 97   83  Resp: 16 18 18 18   Temp:  98.1 F (36.7 C)  98.6 F (37 C)  TempSrc:  Oral  Oral  SpO2: 94% 96% 99% 96%  Weight:      Height:        Intake/Output Summary (Last 24 hours) at 04/17/2023 1206 Last data filed at 04/17/2023 1001 Gross  per 24 hour  Intake 477 ml  Output 2400 ml  Net -1923 ml   Filed Weights   04/15/23 0017 04/15/23 0441 04/16/23 0310  Weight: 104.8 kg 104.8 kg 110.7 kg    Examination:   General: Appearance:     Overweight male in no acute distress     Lungs:      respirations unlabored  Heart:    Normal heart rate.   MS:   Below knee amputation of left lower extremity is noted.   Neurologic:   Awake, alert, poor eye contact      Data Reviewed: I have personally reviewed following labs and imaging studies  CBC: Recent Labs  Lab 04/10/23 1955 04/11/23 0155 04/12/23 0929 04/13/23 1100 04/14/23 0620  04/15/23 0700 04/17/23 0517  WBC 9.6 8.7 9.5 9.2 8.8 8.8 6.8  NEUTROABS 7.4 6.8  --   --   --   --   --   HGB 9.9* 10.0* 9.7* 9.4* 8.9* 8.7* 8.0*  HCT 32.2* 32.3* 31.3* 30.3* 28.7* 28.6* 26.0*  MCV 78.7* 76.4* 78.3* 77.3* 76.9* 79.7* 78.5*  PLT 215 222 171 148* 126* 126* 113*   Basic Metabolic Panel: Recent Labs  Lab 04/12/23 0929 04/13/23 1100 04/14/23 0620 04/15/23 0700 04/17/23 0517  NA 132* 131* 130* 133* 132*  K 3.8 3.9 3.9 3.9 3.9  CL 95* 95* 95* 96* 96*  CO2 29 27 28 29 28   GLUCOSE 118* 210* 174* 150* 120*  BUN 34* 32* 30* 27* 26*  CREATININE 0.83 0.80 0.74 0.69 0.82  CALCIUM 7.9* 7.8* 7.7* 7.9* 8.3*  MG 1.7 1.6* 2.2  --   --   PHOS  --   --  2.7  --   --    GFR: Estimated Creatinine Clearance: 120.8 mL/min (by C-G formula based on SCr of 0.82 mg/dL). Liver Function Tests: Recent Labs  Lab 04/13/23 1100 04/15/23 0700  AST 27 29  ALT 20 20  ALKPHOS 84 81  BILITOT 0.5 0.6  PROT 5.0* 5.1*  ALBUMIN 2.0* 2.0*   No results for input(s): "LIPASE", "AMYLASE" in the last 168 hours. No results for input(s): "AMMONIA" in the last 168 hours. Coagulation Profile: No results for input(s): "INR", "PROTIME" in the last 168 hours. Cardiac Enzymes: Recent Labs  Lab 04/10/23 1955 04/17/23 0517  CKTOTAL 24* 27*   BNP (last 3 results) No results for input(s): "PROBNP" in the last 8760 hours. HbA1C: No results for input(s): "HGBA1C" in the last 72 hours. CBG: Recent Labs  Lab 04/16/23 0633 04/16/23 1128 04/16/23 1602 04/16/23 2122 04/17/23 0620  GLUCAP 139* 174* 181* 136* 117*   Lipid Profile: No results for input(s): "CHOL", "HDL", "LDLCALC", "TRIG", "CHOLHDL", "LDLDIRECT" in the last 72 hours. Thyroid Function Tests: No results for input(s): "TSH", "T4TOTAL", "FREET4", "T3FREE", "THYROIDAB" in the last 72 hours. Anemia Panel: No results for input(s): "VITAMINB12", "FOLATE", "FERRITIN", "TIBC", "IRON", "RETICCTPCT" in the last 72 hours.  Sepsis  Labs: Recent Labs  Lab 04/11/23 0829  LATICACIDVEN 1.6    No results found for this or any previous visit (from the past 240 hour(s)).    Radiology Studies: No results found.  Scheduled Meds:  acetaminophen  1,000 mg Oral Q8H   ascorbic acid  500 mg Oral BID   [START ON 04/18/2023] atorvastatin  40 mg Oral Daily   Chlorhexidine Gluconate Cloth  6 each Topical Daily   docusate sodium  100 mg Oral BID   feeding supplement  237 mL Oral TID BM  gabapentin  300 mg Oral QHS   hydrocortisone  5 mg Oral BID   insulin aspart  0-15 Units Subcutaneous TID WC   insulin aspart  0-5 Units Subcutaneous QHS   insulin glargine-yfgn  20 Units Subcutaneous QHS   midodrine  10 mg Oral TID with meals   nutrition supplement (JUVEN)  1 packet Oral BID BM   oxyCODONE  15 mg Oral Q12H   pantoprazole  40 mg Oral Daily   sodium chloride flush  10-40 mL Intracatheter Q12H   tamsulosin  0.4 mg Oral QPC supper   zinc sulfate  220 mg Oral Daily   Continuous Infusions:  sodium chloride 10 mL/hr at 04/14/23 1811   DAPTOmycin (CUBICIN) 800 mg in sodium chloride 0.9 % IVPB 800 mg (04/16/23 1333)   ertapenem 1,000 mg (04/16/23 1332)     LOS: 15 days   Joseph Art, DO [facility]  04/17/2023, 12:06 PM     How to contact the [facility] Attending or Consulting provider 7A - 7P or covering provider during after hours 7P -7A, for this patient?  Check the care team in [facility] and look for a) attending/consulting TRH provider listed and b) the [facility] team listed. Page or secure chat 7A-7P. Log into [email or web address] and use [place]'s universal password to access. If you do not have the password, please contact the hospital operator. Locate the [facility] provider you are looking for under [facility] and page to a number that you can be directly reached. If you still have difficulty reaching the provider, please page the [facility] (Director on Call) for the Hospitalists listed on amion for assistance.

## 2023-04-17 NOTE — TOC Progression Note (Signed)
Transition of Care [facility]) - Progression Note    Patient Details  Name: Peter Yang MRN: [id number] Date of Birth: [birth year]  Transition of Care [facility]) CM/SW Contact  Eduard Roux, [place] Phone Number: 04/17/2023, 11:27 AM  Clinical Narrative:     CSW updated SNF/ [name] - Per MD, patient not stable monitoring  gross hematuria, anticipate d/c tomorrow.  Antony Blackbird, MSW, LCSW Clinical Social Worker    Expected Discharge Plan: Skilled Nursing Facility Barriers to Discharge: Continued Medical Work up  Expected Discharge Plan and Services   Discharge Planning Services: CM Consult Post Acute Care Choice: Skilled Nursing Facility Living arrangements for the past 2 months: Skilled Nursing Facility                                       Social Determinants of Health (SDOH) Interventions SDOH Screenings   Food Insecurity: No Food Insecurity (04/02/2023)  Housing: Low Risk  (04/02/2023)  Transportation Needs: No Transportation Needs (04/02/2023)  Utilities: Not At Risk (04/02/2023)  Tobacco Use: Low Risk  (04/10/2023)    Readmission Risk Interventions    10/24/2022   11:21 AM 09/02/2022   10:36 AM  Readmission Risk Prevention Plan  Transportation Screening Complete Complete  Medication Review Oceanographer) Complete Complete  PCP or Specialist appointment within 3-5 days of discharge Complete Complete  HRI or Home Care Consult Complete Complete  SW Recovery Care/Counseling Consult Complete Complete  Palliative Care Screening Not Applicable Not Applicable  Skilled Nursing Facility Complete Complete

## 2023-04-17 NOTE — Progress Notes (Signed)
Daily Progress Note   Patient Name: Peter Yang       Date: 04/17/2023 DOB: Mar 30, 1956  Age: 67 y.o. MRN#: [id number] Attending Physician: Joseph Art, DO Primary Care Physician: Annita Brod, MD Admit Date: 04/01/2023  Reason for Consultation/Follow-up: Establishing goals of care  Subjective: Medical records reviewed including progress notes, labs, and imaging. Patient assessed at the bedside. He reports continued pain, 9.5/10 and worse than yesterday. He has required 5 doses of PRN oxycodone in the past 24 hours. He does feel that he slept better overnight with increased dose of oxycontin.   Created space and opportunity for patient's thoughts and feelings on his current illness. He currently prefers to continue with medical interventions rather than transition to comfort care. Explored his thoughts on return to [name]. He states "I'm not thrilled"  though understands he has limited options for now.   Questions and concerns addressed. PMT will continue to support holistically.   Length of Stay: 15   Physical Exam Vitals and nursing note reviewed.  Constitutional:      General: He is not in acute distress.    Appearance: He is ill-appearing.  Cardiovascular:     Rate and Rhythm: Normal rate.  Pulmonary:     Effort: Pulmonary effort is normal. No respiratory distress.  Neurological:     Mental Status: He is alert. Mental status is at baseline.  Psychiatric:        Mood and Affect: Affect is blunt.        Behavior: Behavior normal.             Vital Signs: BP 100/66 (BP Location: Left Arm)   Pulse 83   Temp 98.6 F (37 C) (Oral)   Resp 18   Ht 6\' 4"  (1.93 m)   Wt 110.7 kg   SpO2 96%   BMI 29.70 kg/m  SpO2: SpO2: 96 % O2 Device: O2 Device: Room Air O2 Flow  Rate: O2 Flow Rate (L/min): 0 L/min  Palliative Care Assessment & Plan   Patient Profile: 67 y.o. 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     Patient had acute urinary tension on Saturday, June 8.  He had a Foley catheter placed at that time.  He started developing pain in his penis yesterday.  He was sent to the ER for evaluation.   Patient faces treatment option decisions, advanced directives and anticipatory care needs.   Assessment: Goals of care conversation UTI due to indwelling catheter Acute on chronic combined CHF Hypoalbuminemia Fournier's gangrene status post left BKA  Recommendations/Plan: Continue DNR/DNI Continue current care Patient is not ready for comfort care at this time Psychosocial and emotional support provided PMT will continue to follow intermittently   Prognosis: Poor long-term prognosis  Discharge Planning: SNF  Care plan was discussed with patient, RN  Total time: I spent 35 minutes in the care of the patient today in the above activities and documenting the encounter.   Richardson Dopp, PA-C Palliative Medicine Team Team phone # [phone]  Thank you for allowing the Palliative Medicine Team to assist in the care of this patient. Please utilize secure chat with additional questions, if there is no response within 30 minutes please call the above phone number.  Palliative Medicine Team providers are available by phone from 7am to 7pm daily and can be reached through the team cell phone.  Should this patient require assistance outside of these hours, please call the patient's attending physician.  Portions of  this note are a verbal dictation therefore any spelling and/or grammatical errors are due to the "[employer]" system interpretation.

## 2023-04-18 LAB — CBC
HCT: 26.5 % — ABNORMAL LOW (ref 39.0–52.0)
Hemoglobin: 8.3 g/dL — ABNORMAL LOW (ref 13.0–17.0)
MCH: 25 pg — ABNORMAL LOW (ref 26.0–34.0)
MCHC: 31.3 g/dL (ref 30.0–36.0)
MCV: 79.8 fL — ABNORMAL LOW (ref 80.0–100.0)
Platelets: 125 10*3/uL — ABNORMAL LOW (ref 150–400)
RBC: 3.32 MIL/uL — ABNORMAL LOW (ref 4.22–5.81)
RDW: 20.4 % — ABNORMAL HIGH (ref 11.5–15.5)
WBC: 6.5 10*3/uL (ref 4.0–10.5)
nRBC: 0 % (ref 0.0–0.2)

## 2023-04-18 LAB — GLUCOSE, CAPILLARY: Glucose-Capillary: 99 mg/dL (ref 70–99)

## 2023-04-18 MED ORDER — ZINC SULFATE 220 (50 ZN) MG PO CAPS: 220.0000 mg | ORAL_CAPSULE | Freq: Every day | ORAL | Status: DC

## 2023-04-18 MED ORDER — DOCUSATE SODIUM 100 MG PO CAPS: 100.0000 mg | ORAL_CAPSULE | Freq: Two times a day (BID) | ORAL | 0 refills | Status: DC

## 2023-04-18 MED ORDER — OXYCODONE HCL ER 15 MG PO T12A: 15.0000 mg | EXTENDED_RELEASE_TABLET | Freq: Two times a day (BID) | ORAL | 0 refills | Status: DC

## 2023-04-18 MED ORDER — ACETAMINOPHEN 500 MG PO TABS: 1000.0000 mg | ORAL_TABLET | Freq: Three times a day (TID) | ORAL | 0 refills | Status: DC

## 2023-04-18 MED ORDER — MIDODRINE HCL 10 MG PO TABS: 10.0000 mg | ORAL_TABLET | Freq: Three times a day (TID) | ORAL | Status: DC

## 2023-04-18 MED ORDER — INSULIN GLARGINE-YFGN 100 UNIT/ML ~~LOC~~ SOLN: 20.0000 [IU] | Freq: Every day | SUBCUTANEOUS | 11 refills | Status: DC

## 2023-04-18 MED ORDER — DAPTOMYCIN IV (FOR PTA / DISCHARGE USE ONLY)
800.0000 mg | INTRAVENOUS | 0 refills | Status: AC
Start: 2023-04-18 — End: 2023-05-28

## 2023-04-18 MED ORDER — INSULIN ASPART 100 UNIT/ML IJ SOLN: 0.0000 [IU] | Freq: Three times a day (TID) | INTRAMUSCULAR | 11 refills | Status: DC

## 2023-04-18 MED ORDER — ERTAPENEM IV (FOR PTA / DISCHARGE USE ONLY)
1.0000 g | INTRAVENOUS | 0 refills | Status: AC
Start: 2023-04-18 — End: 2023-05-28

## 2023-04-18 MED ORDER — ATORVASTATIN CALCIUM 80 MG PO TABS: 40.0000 mg | ORAL_TABLET | Freq: Every evening | ORAL | 0 refills | Status: DC

## 2023-04-18 MED ORDER — OXYCODONE HCL 5 MG PO TABS: 5.0000 mg | ORAL_TABLET | ORAL | 0 refills | Status: DC | PRN

## 2023-04-18 MED ORDER — ASCORBIC ACID 500 MG PO TABS: 500.0000 mg | ORAL_TABLET | Freq: Two times a day (BID) | ORAL | 0 refills | Status: DC

## 2023-04-18 MED ORDER — HYDROCORTISONE 5 MG PO TABS: 5.0000 mg | ORAL_TABLET | Freq: Two times a day (BID) | ORAL | Status: DC

## 2023-04-18 MED ORDER — GABAPENTIN 300 MG PO CAPS: 300.0000 mg | ORAL_CAPSULE | Freq: Every day | ORAL | Status: DC

## 2023-04-18 NOTE — Discharge Summary (Signed)
Physician Discharge Summary  Peter Yang WUX:[id number] DOB: May 14, 1956 DOA: 04/01/2023  PCP: Annita Brod, MD  Admit date: 04/01/2023 Discharge date: 04/18/2023  Admitted From: SNF Discharge disposition: SNF   Recommendations for Outpatient Follow-Up:   Will d/c WITH foley-- voiding trial at SNF Will d/c with PICC line--- remove after abx complete on 7/30 Continue palliative care discussions at [facility] Daily weights-- may need PRN lasix if BP tolerates Monitor blood sugars Cbc/bmp 1 week Resume eliquis and monitor for hematuria   Discharge Diagnosis:   Principal Problem:   UTI (urinary tract infection) due to urinary indwelling catheter (HCC) Active Problems:   Fournier's gangrene in male   Essential hypertension   PAF (paroxysmal atrial fibrillation) (HCC)   Chronic HFrEF (heart failure with reduced ejection fraction) (HCC)   DNR (do not resuscitate)   History of urinary retention   Type 2 diabetes mellitus with diabetic peripheral angiopathy without gangrene, without long-term current use of insulin (HCC)   Gross hematuria   S/P BKA (below knee amputation) unilateral, left (HCC)   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  Diet recommendation: Regular.  Wound care: see below  Code status: DNR   History of Present Illness:   67 year old male with a history of left below-knee amputation, chronic combined diastolic and systolic CHF with LVEF 35% and grade 2 diastolic dysfunction, paroxysmal A-fib on Eliquis, recent admission in April 2024 at [facility] for right diabetic foot ulcer status post fifth ray amputation, hypertension, type 2 diabetes who presents to the ER from [place] nursing home due to pus and blood from his Foley catheter.  Patient had acute urinary retention on Saturday, June  8.  He had a Foley catheter placed at that time.  CT scan showed findings c/w fournier's gangrene.  Seen by urology, status post I&D on 04/02/23.  Returned to OR 04/09/23 for wound closure.   Cardiology has been consulted with concern for heart failure/volume overload, but they noted his poor cardiovascular prognosis and did not suspect he was in decompensated heart failure.  At this time, on antibiotics per ID (plan for 6 weeks after last surgical procedure).  Seen by palliative care for goals of care discussions.   Hospital Course by Problem:   Acute on Chronic combined diastolic and systolic CHF with LVEF 35%, grade 2 diastolic dysfunction. Hypoalbuminemia  Anasarca  Echo done on 04/07/2023 with EF 35%, grade II diastolic dysfunction, mildly reduce RVSF, RV size moderately enlarged, severe MV regurg. Volume overloaded on exam with dependent edema on bilateral thighs. Albumin is 2.2. CXR with increased density in both lower lung fields, bilateral effusions, possible atelectasis/pneumonia (clinically not c/w pneumonia) Cardiology notes limited cardiovascular options, but did not think he was in decompensated HF. Note poor cardiovascular prognosis.  See 6/16 note.  Palliative care consulted.   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  No evidence of osteo.  Diffuse myositis throughout pelvic and thigh musculature.  Underwent wound washout and closure with exchange of the Foley catheter by Dr.  Marlou Porch 04/09/2023.  No further surgical plans by urology.  PICC line placed 04/10/2023.   -ID discontinued Merrem and linezolid and started on ertapenem and daptomycin on 04/10/2023 with the plan to continue both antibiotics for 6 weeks with EOT on 05/20/2023. Stop SGLT2 at discharge, added to "allergies/contraindications" list Urology following, appreciate assistance. -hematuria stopped-- resume eliquis   Acute blood loss anemia in the setting of gross hematuria -trend CBC   Long term current use of systemic steroids Unclear why he was on cortef on admission.  Was on 10 mg Solu-Cortef in the morning and 5 at night. Appears to have started in October/November 2023.I think these were started due to his chronic hypotension. was started to wean 04/09/2023 and was stopped 6/20; however, given how long he has been on this medication will continue at least 5 mg BID and then taper.  Blood pressure was low, midodrine was increased from 5 mg to 10 mg TID. Ideally he would not be on this medication as he has no notable symptoms, and no evidence of disruption of end organ perfusion.  -taper over weeks  Currently on midodrine 10 mg 3 times daily Continue to closely monitor vital signs.   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 Continue local wound care.   S/P BKA (below knee amputation) unilateral, left (HCC)     Type 2 diabetes mellitus with diabetic peripheral angiopathy without gangrene, without long-term current use of insulin (HCC) Continue with SSI and 20 units of lantus nightly.     History of urinary retention Foley catheter placed on Saturday 03-29-2023.  - Flomax, he underwent Foley exchange  04/09/2023.   PAF (paroxysmal atrial fibrillation) (HCC) on Eliquis Currently not on rate control agent.  Rate controlled.   Essential hypertension Stable. Has hx of orthostatic hypotension. Vital signs are currently stable, on midodrine 10 mg 3 times daily Continue to monitor vital signs   Prostate Cancer Follow outpatient with urology   Goals of care Currently DNR Palliative care to follow at SNF-- poor overall prognosis    Medical Consultants:   Urology Palliative care cards   Discharge Exam:   Vitals:   04/17/23 1722 04/17/23 2252  BP: 92/64 92/64  Pulse: 71 75  Resp: 18 16  Temp: 98.1 F (36.7 C) 98 F (36.7 C)  SpO2: 93% 96%   Vitals:   04/17/23 0951 04/17/23 1325 04/17/23 1722 04/17/23 2252  BP: 100/66 95/70 92/64  92/64  Pulse: 83 84 71 75  Resp: 18 18 18 16   Temp: 98.6 F (37 C) 97.7 F (36.5 C) 98.1 F (36.7 C) 98 F (36.7 C)  TempSrc: Oral Oral Oral Oral  SpO2: 96% 93% 93% 96%  Weight:      Height:        General exam: Appears calm and comfortable.   The results of significant diagnostics from this hospitalization (including imaging, microbiology, ancillary and laboratory) are listed below for reference.     Procedures and Diagnostic Studies:   CT Renal Stone Study  Result Date: 04/01/2023 CLINICAL DATA:  Bladder diverticulum EXAM: CT ABDOMEN AND PELVIS WITHOUT CONTRAST TECHNIQUE: Multidetector CT imaging of the abdomen and pelvis was performed following the standard protocol without IV contrast. RADIATION DOSE REDUCTION: This exam was performed according to the departmental dose-optimization program which includes automated exposure control, adjustment of the mA and/or kV according to patient size and/or use of iterative reconstruction technique. COMPARISON:  01/22/2023 FINDINGS: Lower chest: Moderate bilateral pleural effusions. Compressive atelectasis in the lower lobes. Diffuse calcifications in the visualized right coronary artery. Moderate  aortic calcifications. Hepatobiliary: Layering gallstones within the gallbladder. No focal hepatic abnormality. Pancreas: No focal abnormality or ductal dilatation. Spleen: No focal abnormality.  Normal size. Adrenals/Urinary Tract: Adrenal glands normal. Mild left hydronephrosis, similar prior study. Mild caliectasis on the right. Right ureters decompressed. Foley catheter is present within the bladder which is decompressed. There appears to be mild bladder wall thickening. Stomach/Bowel: Stomach, large and small bowel grossly unremarkable. Vascular/Lymphatic: Diffuse aortoiliac atherosclerosis. No evidence of aneurysm or adenopathy. Reproductive: There is gas noted within the subcutaneous soft tissues surrounding the base of the penis and extending in the lower anterior abdominal wall concerning for Fournier's gangrene. Diffuse edema noted throughout the scrotal wall. Other: Diffuse anasarca/edema throughout the abdominal wall and pelvis. Musculoskeletal: No acute bony abnormality. IMPRESSION: Gas and soft tissue stranding noted within the subcutaneous soft tissues surrounding the base of the penis and extending superiorly in the anterior abdominal wall concerning for Fournier's gangrene. Diffuse edema throughout the scrotal wall. Bladder is decompressed with Foley catheter present, but appears thick walled. This is associated with mild left hydronephrosis and right renal pelvicaliectasis. These findings are similar to prior study. Cholelithiasis. Coronary artery disease, aortic atherosclerosis. Moderate bilateral pleural effusions, bibasilar atelectasis. Electronically Signed   By: Charlett Nose M.D.   On: 04/01/2023 23:54     Labs:   Basic Metabolic Panel: Recent Labs  Lab 04/12/23 0929 04/13/23 1100 04/14/23 0620 04/15/23 0700 04/17/23 0517  NA 132* 131* 130* 133* 132*  K 3.8 3.9 3.9 3.9 3.9  CL 95* 95* 95* 96* 96*  CO2 29 27 28 29 28   GLUCOSE 118* 210* 174* 150* 120*  BUN 34* 32* 30* 27* 26*   CREATININE 0.83 0.80 0.74 0.69 0.82  CALCIUM 7.9* 7.8* 7.7* 7.9* 8.3*  MG 1.7 1.6* 2.2  --   --   PHOS  --   --  2.7  --   --    GFR Estimated Creatinine Clearance: 120.8 mL/min (by C-G formula based on SCr of 0.82 mg/dL). Liver Function Tests: Recent Labs  Lab 04/13/23 1100 04/15/23 0700  AST 27 29  ALT 20 20  ALKPHOS 84 81  BILITOT 0.5 0.6  PROT 5.0* 5.1*  ALBUMIN 2.0* 2.0*   No results for input(s): "LIPASE", "AMYLASE" in the last 168 hours. No results for input(s): "AMMONIA" in the last 168 hours. Coagulation profile No results for input(s): "INR", "PROTIME" in the last 168 hours.  CBC: Recent Labs  Lab 04/13/23 1100 04/14/23 0620 04/15/23 0700 04/17/23 0517 04/18/23 0612  WBC 9.2 8.8 8.8 6.8 6.5  HGB 9.4* 8.9* 8.7* 8.0* 8.3*  HCT 30.3* 28.7* 28.6* 26.0* 26.5*  MCV 77.3* 76.9* 79.7* 78.5* 79.8*  PLT 148* 126* 126* 113* 125*   Cardiac Enzymes: Recent Labs  Lab 04/17/23 0517  CKTOTAL 27*   BNP: Invalid input(s): "POCBNP" CBG: Recent Labs  Lab 04/17/23 0620 04/17/23 1324 04/17/23 1721 04/17/23 2115 04/18/23 0604  GLUCAP 117* 206* 109* 108* 99   D-Dimer No results  for input(s): "DDIMER" in the last 72 hours. Hgb A1c No results for input(s): "HGBA1C" in the last 72 hours. Lipid Profile No results for input(s): "CHOL", "HDL", "LDLCALC", "TRIG", "CHOLHDL", "LDLDIRECT" in the last 72 hours. Thyroid function studies No results for input(s): "TSH", "T4TOTAL", "T3FREE", "THYROIDAB" in the last 72 hours.  Invalid input(s): "FREET3" Anemia work up No results for input(s): "VITAMINB12", "FOLATE", "FERRITIN", "TIBC", "IRON", "RETICCTPCT" in the last 72 hours. Microbiology No results found for this or any previous visit (from the past 240 hour(s)).   Discharge Instructions:   Discharge Instructions     Diet general   Complete by: As directed    Discharge wound care:   Complete by: As directed    Cleanse wounds to sacrum, right knee and right foot  with VASHE solution (LAWSON # [id number]) and apply VASHE moist gauze to wound bed to debride and promote healing.  Cover with ABD or foam dressing. Change daily.   Home infusion instructions   Complete by: As directed    Instructions: Flushing of vascular access device: 0.9% NaCl pre/post medication administration and prn patency; Heparin 100 u/ml, 5ml for implanted ports and Heparin 10u/ml, 5ml for all other central venous catheters.   Increase activity slowly   Complete by: As directed       Allergies as of 04/18/2023       Reactions   Sglt2 Inhibitors    Fournier's gangrene        Medication List     STOP taking these medications    aspirin EC 81 MG tablet   cephALEXin 500 MG capsule Commonly known as: KEFLEX   clopidogrel 75 MG tablet Commonly known as: PLAVIX   cyanocobalamin 1000 MCG tablet   DECUBI-VITE PO   furosemide 20 MG tablet Commonly known as: LASIX   Jardiance 10 MG Tabs tablet Generic drug: empagliflozin   Lantus SoloStar 100 UNIT/ML Solostar Pen Generic drug: insulin glargine   lisinopril 2.5 MG tablet Commonly known as: ZESTRIL   Magnesium 400 MG Tabs   metFORMIN 1000 MG tablet Commonly known as: GLUCOPHAGE   metFORMIN 500 MG tablet Commonly known as: GLUCOPHAGE   metoprolol succinate 25 MG 24 hr tablet Commonly known as: TOPROL-XL   Miconazole Powd   nystatin powder Commonly known as: MYCOSTATIN/NYSTOP   polyethylene glycol 17 g packet Commonly known as: MIRALAX / GLYCOLAX   potassium chloride 10 MEQ tablet Commonly known as: KLOR-CON   sodium chloride 0.65 % Soln nasal spray Commonly known as: OCEAN   Systane Balance 0.6 % Soln Generic drug: Propylene Glycol   traMADol 50 MG tablet Commonly known as: ULTRAM   traZODone 100 MG tablet Commonly known as: DESYREL   zinc oxide 20 % ointment       TAKE these medications    acetaminophen 500 MG tablet Commonly known as: TYLENOL Take 2 tablets (1,000 mg total) by mouth  every 8 (eight) hours. What changed:  medication strength how much to take when to take this reasons to take this   albuterol 108 (90 Base) MCG/ACT inhaler Commonly known as: VENTOLIN HFA Inhale 2 puffs into the lungs every 4 (four) hours as needed for wheezing or shortness of breath.   albuterol (2.5 MG/3ML) 0.083% nebulizer solution Commonly known as: PROVENTIL Take 2.5 mg by nebulization every 4 (four) hours as needed for wheezing or shortness of breath.   apixaban 5 MG Tabs tablet Commonly known as: ELIQUIS Take 1 tablet (5 mg total) by mouth 2 (two) times  daily.   ascorbic acid 500 MG tablet Commonly known as: VITAMIN C Take 1 tablet (500 mg total) by mouth 2 (two) times daily for 21 days.   atorvastatin 80 MG tablet Commonly known as: LIPITOR Take 0.5 tablets (40 mg total) by mouth every evening. What changed: how much to take   B-complex with vitamin C tablet Take 1 tablet by mouth daily.   bisacodyl 10 MG suppository Commonly known as: DULCOLAX Place 1 suppository (10 mg total) rectally daily as needed for moderate constipation.   daptomycin  IVPB Commonly known as: CUBICIN Inject 800 mg into the vein daily. Indication:  Fournier's gangrene/osteo  First Dose: Yes Last Day of Therapy:  05/20/23- Please pull PICC at completion of IV antibiotics  Labs - Once weekly:  CBC/D, CMP, and CPK Labs - Once weekly: ESR and CRP Method of administration: IV Push Method of administration may be changed at the discretion of home infusion pharmacist based upon assessment of the patient and/or caregiver's ability to self-administer the medication ordered.   diclofenac Sodium 1 % Gel Commonly known as: VOLTAREN Apply 1 g topically daily. Apply to bilateral knees   docusate sodium 100 MG capsule Commonly known as: COLACE Take 1 capsule (100 mg total) by mouth 2 (two) times daily.   ertapenem  IVPB Commonly known as: INVANZ Inject 1 g into the vein daily. Indication:   Fournier's gangrene/osteo  First Dose: Yes Last Day of Therapy:  05/20/23- Please pull PICC at completion of IV antibiotics  Labs - Once weekly:  CBC/D and CMP, Labs - Once weekly: ESR and CRP Method of administration: Mini-Bag Plus / Gravity Method of administration may be changed at the discretion of home infusion pharmacist based upon assessment of the patient and/or caregiver's ability to self-administer the medication ordered.   feeding supplement (PRO-STAT SUGAR FREE 64) Liqd Take 30 mLs by mouth daily.   feeding supplement Liqd Take 237 mLs by mouth 3 (three) times daily between meals.   ferrous sulfate 325 (65 FE) MG tablet Take 1 tablet (325 mg total) by mouth daily with breakfast.   finasteride 5 MG tablet Commonly known as: PROSCAR Take 5 mg by mouth daily.   gabapentin 300 MG capsule Commonly known as: NEURONTIN Take 1 capsule (300 mg total) by mouth at bedtime. What changed: when to take this   hydrocortisone 5 MG tablet Commonly known as: CORTEF Take 1 tablet (5 mg total) by mouth 2 (two) times daily. What changed:  when to take this Another medication with the same name was removed. Continue taking this medication, and follow the directions you see here.   insulin aspart 100 UNIT/ML injection Commonly known as: novoLOG Inject 0-15 Units into the skin 3 (three) times daily with meals.   insulin glargine-yfgn 100 UNIT/ML injection Commonly known as: SEMGLEE Inject 0.2 mLs (20 Units total) into the skin at bedtime.   melatonin 5 MG Tabs Take 1 tablet (5 mg total) by mouth daily at 6 PM. What changed: when to take this   midodrine 10 MG tablet Commonly known as: PROAMATINE Take 1 tablet (10 mg total) by mouth with breakfast, with lunch, and with evening meal. What changed:  medication strength how much to take   oxyCODONE 15 mg 12 hr tablet Commonly known as: OXYCONTIN Take 1 tablet (15 mg total) by mouth every 12 (twelve) hours. What changed: You were  already taking a medication with the same name, and this prescription was added. Make sure you understand  how and when to take each.   oxyCODONE 5 MG immediate release tablet Commonly known as: Oxy IR/ROXICODONE Take 1 tablet (5 mg total) by mouth every 4 (four) hours as needed for moderate pain, severe pain or breakthrough pain. What changed: when to take this   OXYGEN Inhale 2 L into the lungs continuous. To maintain stats above 90%  PRN order: use as needed for shortness of breath while laying flat on exertion   pantoprazole 40 MG tablet Commonly known as: PROTONIX Take 1 tablet (40 mg total) by mouth daily.   saccharomyces boulardii 250 MG capsule Commonly known as: FLORASTOR Take 250 mg by mouth 2 (two) times daily.   senna-docusate 8.6-50 MG tablet Commonly known as: Senokot-S Take 1 tablet by mouth 2 (two) times daily.   tamsulosin 0.4 MG Caps capsule Commonly known as: FLOMAX Take 0.4 mg by mouth daily.   zinc sulfate 220 (50 Zn) MG capsule Take 1 capsule (220 mg total) by mouth daily.               Home Infusion Instuctions  (From admission, onward)           Start     Ordered   04/18/23 0000  Home infusion instructions       Question:  Instructions  Answer:  Flushing of vascular access device: 0.9% NaCl pre/post medication administration and prn patency; Heparin 100 u/ml, 5ml for implanted ports and Heparin 10u/ml, 5ml for all other central venous catheters.   04/18/23 0809              Discharge Care Instructions  (From admission, onward)           Start     Ordered   04/18/23 0000  Discharge wound care:       Comments: Cleanse wounds to sacrum, right knee and right foot with VASHE solution (LAWSON # [id number]) and apply VASHE moist gauze to wound bed to debride and promote healing.  Cover with ABD or foam dressing. Change daily.   04/18/23 0809            Follow-up Information     Judyann Munson, MD Follow up on 05/08/2023.    Specialty: Infectious Diseases Why: Hospital Discharge Follow Up 4:00 pm Contact information: [address] [place] [address] [phone]                  Time coordinating discharge: 45 min  Signed:  Joseph Art DO  [facility] 04/18/2023, 8:10 AM

## 2023-04-18 NOTE — Progress Notes (Signed)
Tried to give report to RN at [employer]. Left phone number to get return call.

## 2023-04-18 NOTE — Progress Notes (Signed)
9 Days Post-Op Subjective: 6/28: NAEON. Patient is extremely lethargic and refuses to attempt to participate in care. He remains resistant to dressing changes.  Alert and oriented.  Objective: Vital signs in last 24 hours: Temp:  [97.7 F (36.5 C)-98.8 F (37.1 C)] 97.7 F (36.5 C) (06/28 1319) Pulse Rate:  [71-84] 82 (06/28 1319) Resp:  [16-18] 18 (06/28 1319) BP: (92-106)/(64-80) 106/80 (06/28 1319) SpO2:  [93 %-100 %] 99 % (06/28 1319)  Intake/Output from previous day: 06/27 0701 - 06/28 0700 In: 1218 [P.O.:720; IV Piggyback:498] Out: 2100 [Urine:2100]  Intake/Output this shift: Total I/O In: 240 [P.O.:240] Out: 1050 [Urine:1050]  Physical Exam:  General: Alert and oriented CV: No cyanosis Lungs: equal chest rise Abdomen: Soft, NTND, no rebound or guarding Gu: Foley catheter in place draining yellow urine with sediment   Lab Results: Recent Labs    04/17/23 0517 04/18/23 0612  HGB 8.0* 8.3*  HCT 26.0* 26.5*   BMET Recent Labs    04/17/23 0517  NA 132*  K 3.9  CL 96*  CO2 28  GLUCOSE 120*  BUN 26*  CREATININE 0.82  CALCIUM 8.3*     Studies/Results: No results found.  Assessment/Plan: # Fournier's gangrene Initially taken to the OR on 04/02/2023 by Dr. Marlou Porch for debridement of Fournier's gangrene. Taken back 04/09/2023 for second look and closure.  Penrose drains in place.  Hypoalbuminemia secondary to protein calorie malnutrition continues to persist, though has improved significantly with IV albumin and Lasix treatments from primary team.    Patient has declined comfort measures but still request an unreasonably large amount of narcotic for the surgery he had.  Would recommend titrating this down to a in the very near future.  Broad ABX per primary  Patient scheduled to discharge home today.  Penrose drains still in place.  Patient will need ABD pads changed very regularly.  There is an increased chance for skin breakdown if he continues to be  left sitting on damp ABD pads in the context of his anasarca and protein calorie malnutrition.  Will see him in the clinic within a couple of weeks for follow-up. Left message with office info at [facility] schedulers. [phone]      LOS: 16 days   04/18/2023, 2:14 PM

## 2023-04-18 NOTE — TOC Transition Note (Signed)
Transition of Care [facility]) - CM/SW Discharge Note   Patient Details  Name: Peter Yang MRN: [id number] Date of Birth: 05-25-1956  Transition of Care [facility]) CM/SW Contact:  Eduard Roux, LCSW Phone Number: 04/18/2023, 9:50 AM   Clinical Narrative:     Patient will Discharge NW:[id number]  Discharge Date: 04/18/2023 Family Notified: no answer for [name] Transport By: Sharin Mons  Per MD patient is ready for discharge. RN, patient, and facility notified of discharge. Discharge Summary sent to facility. RN given number for [phone]. Ambulance transport requested for patient.   Clinical Social Worker signing off.  Antony Blackbird, MSW, LCSW Clinical Social Worker     Final next level of care: Skilled Nursing Facility Barriers to Discharge: Barriers Resolved   Patient Goals and CMS Choice CMS Medicare.gov Compare Post Acute Care list provided to:: Patient Choice offered to / list presented to : Patient  Discharge Placement                Patient chooses bed at: [facility] Patient to be transferred to facility by: [facility] Name of family member notified: called - no answer for [facility] Patient and family notified of of transfer: 04/18/23  Discharge Plan and Services Additional resources added to the After Visit Summary for     Discharge Planning Services: CM Consult Post Acute Care Choice: Skilled Nursing Facility                               Social Determinants of Health (SDOH) Interventions SDOH Screenings   Food Insecurity: No Food Insecurity (04/02/2023)  Housing: Low Risk  (04/02/2023)  Transportation Needs: No Transportation Needs (04/02/2023)  Utilities: Not At Risk (04/02/2023)  Tobacco Use: Low Risk  (04/10/2023)     Readmission Risk Interventions    10/24/2022   11:21 AM 09/02/2022   10:36 AM  Readmission Risk Prevention Plan  Transportation Screening Complete Complete  Medication Review [employer]) Complete Complete   PCP or Specialist appointment within 3-5 days of discharge Complete Complete  HRI or Home Care Consult Complete Complete  SW Recovery Care/Counseling Consult Complete Complete  Palliative Care Screening Not Applicable Not Applicable  Skilled Nursing Facility Complete Complete

## 2023-04-18 NOTE — [place] FL2 (Signed)
[place] MEDICAID FL2 LEVEL OF CARE FORM     IDENTIFICATION  Patient Name: Peter Yang Birthdate: 11-23-1955 Sex: male Admission Date (Current Location): 04/01/2023  [facility] and [place] Number:  Producer, television/film/video and Address:  [facility], [address] [place], [place] [address]      Provider Number: [id number]  Attending Physician Name and Address:  Joseph Art, DO  Relative Name and Phone Number:       Current Level of Care: Hospital Recommended Level of Care: Skilled Nursing Facility Prior Approval Number:    Date Approved/Denied:   PASRR Number:    Discharge Plan: SNF    Current Diagnoses: Patient Active Problem List   Diagnosis Date Noted   Fournier's gangrene in male 04/02/2023   Fournier disease 04/02/2023   Necrotizing soft tissue infection 04/02/2023   UTI (urinary tract infection) due to urinary indwelling catheter (HCC) 04/01/2023   S/P BKA (below knee amputation) unilateral, left (HCC) 04/01/2023   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) 04/01/2023   Long term current use of systemic steroids 04/01/2023   Thrombocytopenia (HCC) 10/01/2022   Orthostatic hypotension 09/24/2022   HFrEF (heart failure with reduced ejection fraction) (HCC) 09/23/2022   Counseling and coordination of care 09/15/2022   Pain 09/14/2022   High risk medication use 09/14/2022   Need for emotional support 09/12/2022   Mood altered 09/11/2022   Medication management 09/11/2022   Palliative care by specialist 09/08/2022   Goals of care, counseling/discussion 09/08/2022   Physical deconditioning 09/05/2022   Gross hematuria 08/31/2022   Type 2 diabetes mellitus with diabetic peripheral angiopathy without gangrene, without long-term current use of insulin (HCC)    Impaired functional mobility, balance, gait, and endurance 08/03/2022   Obstructive sleep apnea syndrome 08/03/2022   Pleural effusion, bilateral 08/03/2022    Catheter-associated urinary tract infection (HCC) 08/03/2022   History of urinary retention 08/03/2022   Mitral valve regurgitation 08/03/2022   Thoracic ascending aortic aneurysm (HCC) 07/23/2022   Borderline low blood pressure determined by examination 07/04/2022   Coronary artery disease due to lipid rich plaque 06/25/2022   Hematuria 06/25/2022   History of non-ST elevation myocardial infarction (NSTEMI) 06/15/2022   PAF (paroxysmal atrial fibrillation) (HCC) 06/15/2022   Dyslipidemia 06/15/2022   PVD (peripheral vascular disease) (HCC) 06/15/2022   Pressure ulcer of sacral region, stage 2 (HCC) 06/15/2022   Chronic HFrEF (heart failure with reduced ejection fraction) (HCC) 06/15/2022   DNR (do not resuscitate) 06/15/2022   Ischemic cardiomyopathy 02/12/2022   Need for assistance with personal care 08/03/2021   Diabetic polyneuropathy associated with type 2 diabetes mellitus (HCC) 07/28/2021   Prolonged QT interval 07/28/2021   Moderate protein-calorie malnutrition (HCC) 06/29/2021   Chronic insomnia 03/31/2021   BPH with obstruction/lower urinary tract symptoms 02/02/2018   Hyperlipidemia 04/15/2016   Vitamin D deficiency 04/09/2016   Essential hypertension 01/25/2016    Orientation RESPIRATION BLADDER Height & Weight     Self, Time, Situation, Place  Normal Incontinent Weight: 244 lb (110.7 kg) Height:  6\' 4"  (193 cm)  BEHAVIORAL SYMPTOMS/MOOD NEUROLOGICAL BOWEL NUTRITION STATUS      Continent Diet (please see discharge summary)  AMBULATORY STATUS COMMUNICATION OF NEEDS Skin   Limited Assist Verbally Surgical wounds (closed incision perieneum, wound incision diabetic knee RT anterior, wound incision skin tear penis, wound incision Rt buttocks , wound incision Rt toe amputation, Pressure injury Mid Stage II Buttocks)  Personal Care Assistance Level of Assistance  Bathing, Feeding, Dressing Bathing Assistance: Limited assistance Feeding  assistance: Independent Dressing Assistance: Limited assistance     Functional Limitations Info  Sight, Hearing, Speech Sight Info: Impaired Hearing Info: Adequate Speech Info: Adequate    SPECIAL CARE FACTORS FREQUENCY  PT (By licensed PT), OT (By licensed OT)     PT Frequency: 5x per week OT Frequency: 5x per week            Contractures Contractures Info: Not present    Additional Factors Info  Code Status, Allergies Code Status Info: DNR Allergies Info: Sglt2 Inhibitors           Current Medications (04/18/2023):  This is the current hospital active medication list Current Facility-Administered Medications  Medication Dose Route Frequency Provider Last Rate Last Admin   0.9 %  sodium chloride infusion   Intravenous PRN Crist Fat, MD 10 mL/hr at 04/17/23 1843 Infusion Verify at 04/17/23 1843   acetaminophen (TYLENOL) tablet 1,000 mg  1,000 mg Oral Q8H Canary Brim, NP   1,000 mg at 04/18/23 1610   ascorbic acid (VITAMIN C) tablet 500 mg  500 mg Oral BID Hughie Closs, MD   500 mg at 04/18/23 0904   atorvastatin (LIPITOR) tablet 40 mg  40 mg Oral Daily Marlin Canary U, DO   40 mg at 04/18/23 9604   Chlorhexidine Gluconate Cloth 2 % PADS 6 each  6 each Topical Daily Crist Fat, MD   6 each at 04/17/23 1214   DAPTOmycin (CUBICIN) 800 mg in sodium chloride 0.9 % IVPB  8 mg/kg (Adjusted) Intravenous Q1400 Ulyses Southward Q, RPH-CPP 132 mL/hr at 04/17/23 1843 Infusion Verify at 04/17/23 1843   docusate sodium (COLACE) capsule 100 mg  100 mg Oral BID Vann, Jessica U, DO   100 mg at 04/17/23 2200   ertapenem (INVANZ) 1,000 mg in sodium chloride 0.9 % 100 mL IVPB  1 g Intravenous Q24H Pham, Minh Q, RPH-CPP 200 mL/hr at 04/17/23 1843 Infusion Verify at 04/17/23 1843   feeding supplement (ENSURE ENLIVE / ENSURE PLUS) liquid 237 mL  237 mL Oral TID BM Vann, Jessica U, DO   237 mL at 04/18/23 0905   gabapentin (NEURONTIN) capsule 300 mg  300 mg Oral QHS Crist Fat, MD   300 mg at 04/17/23 2200   hydrocortisone (CORTEF) tablet 5 mg  5 mg Oral BID Marin Olp C, MD   5 mg at 04/18/23 0908   insulin aspart (novoLOG) injection 0-15 Units  0-15 Units Subcutaneous TID WC Crist Fat, MD   5 Units at 04/17/23 1332   insulin aspart (novoLOG) injection 0-5 Units  0-5 Units Subcutaneous QHS Crist Fat, MD   2 Units at 04/04/23 2214   insulin glargine-yfgn (SEMGLEE) injection 20 Units  20 Units Subcutaneous QHS Crist Fat, MD   20 Units at 04/17/23 2200   melatonin tablet 10 mg  10 mg Oral QHS PRN Crist Fat, MD   10 mg at 04/16/23 2338   midodrine (PROAMATINE) tablet 10 mg  10 mg Oral TID with meals Dow Adolph N, DO   10 mg at 04/18/23 0905   ondansetron (ZOFRAN) tablet 4 mg  4 mg Oral Q6H PRN Crist Fat, MD       Or   ondansetron [facility]) injection 4 mg  4 mg Intravenous Q6H PRN Crist Fat, MD       Oral care mouth rinse  15 mL Mouth Rinse PRN Crist Fat, MD       oxyCODONE (Oxy IR/ROXICODONE) immediate release tablet 5 mg  5 mg Oral Q4H PRN Crist Fat, MD   5 mg at 04/18/23 0616   oxyCODONE (OXYCONTIN) 12 hr tablet 15 mg  15 mg Oral Q12H Cooper, Josseline P, PA-C   15 mg at 04/18/23 0905   pantoprazole (PROTONIX) EC tablet 40 mg  40 mg Oral Daily Crist Fat, MD   40 mg at 04/18/23 1610   senna (SENOKOT) tablet 8.6 mg  1 tablet Oral Daily PRN Marlin Canary U, DO       sodium chloride flush (NS) 0.9 % injection 10-40 mL  10-40 mL Intracatheter Q12H Pahwani, Ravi, MD   10 mL at 04/18/23 0906   sodium chloride flush (NS) 0.9 % injection 10-40 mL  10-40 mL Intracatheter PRN Hughie Closs, MD   20 mL at 04/11/23 0150   tamsulosin (FLOMAX) capsule 0.4 mg  0.4 mg Oral QPC supper Crist Fat, MD   0.4 mg at 04/17/23 1734   zinc sulfate capsule 220 mg  220 mg Oral Daily Hughie Closs, MD   220 mg at 04/18/23 9604     Discharge Medications: Please see discharge summary  for a list of discharge medications.  Relevant Imaging Results:  Relevant Lab Results:   Additional Information SSN # [id number]  Eduard Roux, LCSW

## 2023-04-18 NOTE — Care Management Important Message (Signed)
Important Message  Patient Details  Name: Peter Yang MRN: [id number] Date of Birth: 15-Aug-1956   Medicare Important Message Given:  Yes     Renie Ora 04/18/2023, 10:27 AM

## 2023-04-19 ENCOUNTER — Emergency Department (HOSPITAL_COMMUNITY)
Admission: EM | Admit: 2023-04-19 | Discharge: 2023-04-19 | Disposition: A | Discharge status: Home / Self Care | Payer: Self-pay | Attending: Emergency Medicine | Admitting: Emergency Medicine

## 2023-04-19 ENCOUNTER — Encounter (HOSPITAL_COMMUNITY): Payer: Self-pay

## 2023-04-19 ENCOUNTER — Emergency Department (HOSPITAL_COMMUNITY): Payer: Self-pay

## 2023-04-19 DIAGNOSIS — W19XXXA Unspecified fall, initial encounter: Secondary | ICD-10-CM

## 2023-04-19 DIAGNOSIS — Z7901 Long term (current) use of anticoagulants: Secondary | ICD-10-CM | POA: Diagnosis not present

## 2023-04-19 DIAGNOSIS — W06XXXA Fall from bed, initial encounter: Secondary | ICD-10-CM | POA: Insufficient documentation

## 2023-04-19 DIAGNOSIS — S0990XA Unspecified injury of head, initial encounter: Secondary | ICD-10-CM | POA: Insufficient documentation

## 2023-04-19 NOTE — ED Provider Notes (Signed)
[place] EMERGENCY DEPARTMENT AT [facility] Provider Note   CSN: [id number] Arrival date & time: 04/19/23  4098     History  Chief Complaint  Patient presents with   fall on blood thinner    Peter Yang is a 67 y.o. male presenting from [name] with a mechanical fall out of bed today, striking his head.  No LOC.  Patient on Eliquis.  Patient denying headache or any new pain.  Discharged yesterday from hospital after management of UTI, Fournier's gangrene s/p debridement  Patient is DNR  Patient has PICC line for antibiotics, ertapenem and daptomycin per ID consult planned to continue until end date 05/20/23  HPI     Home Medications Prior to Admission medications   Medication Sig Start Date End Date Taking? Authorizing Provider  acetaminophen (TYLENOL) 500 MG tablet Take 2 tablets (1,000 mg total) by mouth every 8 (eight) hours. 04/18/23   Joseph Art, DO  albuterol (PROVENTIL) (2.5 MG/3ML) 0.083% nebulizer solution Take 2.5 mg by nebulization every 4 (four) hours as needed for wheezing or shortness of breath.    [provider]  albuterol (VENTOLIN HFA) 108 (90 Base) MCG/ACT inhaler Inhale 2 puffs into the lungs every 4 (four) hours as needed for wheezing or shortness of breath.    [provider]  Amino Acids-Protein Hydrolys (FEEDING SUPPLEMENT, PRO-STAT SUGAR FREE 64,) LIQD Take 30 mLs by mouth daily.    [provider]  apixaban (ELIQUIS) 5 MG TABS tablet Take 1 tablet (5 mg total) by mouth 2 (two) times daily. 08/16/22 04/02/23  Levin Erp, MD  ascorbic acid (VITAMIN C) 500 MG tablet Take 1 tablet (500 mg total) by mouth 2 (two) times daily for 21 days. 04/18/23 05/09/23  Joseph Art, DO  atorvastatin (LIPITOR) 80 MG tablet Take 0.5 tablets (40 mg total) by mouth every evening. 04/18/23   Marlin Canary U, DO  B Complex-C (B-COMPLEX WITH VITAMIN C) tablet Take 1 tablet by mouth daily. 10/24/22   Lanae Boast, MD  bisacodyl  (DULCOLAX) 10 MG suppository Place 1 suppository (10 mg total) rectally daily as needed for moderate constipation. 10/24/22   Lanae Boast, MD  daptomycin (CUBICIN) IVPB Inject 800 mg into the vein daily. Indication:  Fournier's gangrene/osteo  First Dose: Yes Last Day of Therapy:  05/20/23- Please pull PICC at completion of IV antibiotics  Labs - Once weekly:  CBC/D, CMP, and CPK Labs - Once weekly: ESR and CRP Method of administration: IV Push Method of administration may be changed at the discretion of home infusion pharmacist based upon assessment of the patient and/or caregiver's ability to self-administer the medication ordered. 04/18/23 05/28/23  Joseph Art, DO  diclofenac Sodium (VOLTAREN) 1 % GEL Apply 1 g topically daily. Apply to bilateral knees 07/20/21   [provider]  docusate sodium (COLACE) 100 MG capsule Take 1 capsule (100 mg total) by mouth 2 (two) times daily. 04/18/23   Joseph Art, DO  ertapenem (INVANZ) IVPB Inject 1 g into the vein daily. Indication:  Fournier's gangrene/osteo  First Dose: Yes Last Day of Therapy:  05/20/23- Please pull PICC at completion of IV antibiotics  Labs - Once weekly:  CBC/D and CMP, Labs - Once weekly: ESR and CRP Method of administration: Mini-Bag Plus / Gravity Method of administration may be changed at the discretion of home infusion pharmacist based upon assessment of the patient and/or caregiver's ability to self-administer the medication ordered. 04/18/23 05/28/23  Joseph Art,  DO  feeding supplement (ENSURE ENLIVE / ENSURE PLUS) LIQD Take 237 mLs by mouth 3 (three) times daily between meals. Patient not taking: Reported on 04/02/2023 10/24/22   Lanae Boast, MD  ferrous sulfate 325 (65 FE) MG tablet Take 1 tablet (325 mg total) by mouth daily with breakfast. 10/25/22   Lanae Boast, MD  finasteride (PROSCAR) 5 MG tablet Take 5 mg by mouth daily.    [provider]  gabapentin (NEURONTIN) 300 MG capsule Take 1 capsule (300 mg  total) by mouth at bedtime. 04/18/23   Joseph Art, DO  hydrocortisone (CORTEF) 5 MG tablet Take 1 tablet (5 mg total) by mouth 2 (two) times daily. 04/18/23   Joseph Art, DO  insulin aspart (NOVOLOG) 100 UNIT/ML injection Inject 0-15 Units into the skin 3 (three) times daily with meals. 04/18/23   Joseph Art, DO  insulin glargine-yfgn (SEMGLEE) 100 UNIT/ML injection Inject 0.2 mLs (20 Units total) into the skin at bedtime. 04/18/23   Marlin Canary U, DO  melatonin 5 MG TABS Take 1 tablet (5 mg total) by mouth daily at 6 PM. Patient taking differently: Take 5 mg by mouth every evening. 10/24/22   Lanae Boast, MD  midodrine (PROAMATINE) 10 MG tablet Take 1 tablet (10 mg total) by mouth with breakfast, with lunch, and with evening meal. 04/18/23   Marlin Canary U, DO  oxyCODONE (OXY IR/ROXICODONE) 5 MG immediate release tablet Take 1 tablet (5 mg total) by mouth every 4 (four) hours as needed for moderate pain, severe pain or breakthrough pain. 04/18/23   Joseph Art, DO  oxyCODONE (OXYCONTIN) 15 mg 12 hr tablet Take 1 tablet (15 mg total) by mouth every 12 (twelve) hours. 04/18/23   Joseph Art, DO  OXYGEN Inhale 2 L into the lungs continuous. To maintain stats above 90%  PRN order: use as needed for shortness of breath while laying flat on exertion    [provider]  pantoprazole (PROTONIX) 40 MG tablet Take 1 tablet (40 mg total) by mouth daily. 06/30/22   Pokhrel, Rebekah Chesterfield, MD  saccharomyces boulardii (FLORASTOR) 250 MG capsule Take 250 mg by mouth 2 (two) times daily. Patient not taking: Reported on 04/02/2023    [provider]  senna-docusate (SENOKOT-S) 8.6-50 MG tablet Take 1 tablet by mouth 2 (two) times daily. Patient not taking: Reported on 04/02/2023 10/24/22   Lanae Boast, MD  tamsulosin (FLOMAX) 0.4 MG CAPS capsule Take 0.4 mg by mouth daily. 07/29/22   [provider]  zinc sulfate 220 (50 Zn) MG capsule Take 1 capsule (220 mg total) by mouth daily.  04/18/23   Joseph Art, DO      Allergies    Sglt2 inhibitors    Review of Systems   Review of Systems  Physical Exam Updated Vital Signs BP 102/68   Pulse 94   Temp 97.7 F (36.5 C) (Oral)   Resp 13   Ht 6\' 2"  (1.88 m)   Wt 109.3 kg   SpO2 97%   BMI 30.94 kg/m  Physical Exam Constitutional:      General: He is not in acute distress.    Appearance: He is obese.  HENT:     Head: Normocephalic and atraumatic.  Eyes:     Conjunctiva/sclera: Conjunctivae normal.     Pupils: Pupils are equal, round, and reactive to light.  Cardiovascular:     Rate and Rhythm: Normal rate and regular rhythm.  Pulmonary:  Effort: Pulmonary effort is normal. No respiratory distress.  Abdominal:     General: There is no distension.     Tenderness: There is no abdominal tenderness.  Genitourinary:    Comments: Indwelling foley Musculoskeletal:     Comments: Left BKA  Skin:    General: Skin is warm and dry.     Comments: Right arm picc line  Neurological:     General: No focal deficit present.     Mental Status: He is alert. Mental status is at baseline.  Psychiatric:        Mood and Affect: Mood normal.        Behavior: Behavior normal.     ED Results / Procedures / Treatments   Labs (all labs ordered are listed, but only abnormal results are displayed) Labs Reviewed - No data to display  EKG None  Radiology CT Head Wo Contrast  Result Date: 04/19/2023 CLINICAL DATA:  Minor head trauma.  Fall.  Patient on Eliquis. EXAM: CT HEAD WITHOUT CONTRAST TECHNIQUE: Contiguous axial images were obtained from the base of the skull through the vertex without intravenous contrast. RADIATION DOSE REDUCTION: This exam was performed according to the departmental dose-optimization program which includes automated exposure control, adjustment of the mA and/or kV according to patient size and/or use of iterative reconstruction technique. COMPARISON:  08/02/2022 FINDINGS: Brain: Ventricles,  cisterns and other CSF spaces are within normal. Minimal chronic ischemic microvascular disease somewhat asymmetric to the right frontal parietal region unchanged. No mass, mass effect, shift of midline structures or acute hemorrhage. No acute infarction. Vascular: No hyperdense vessel or unexpected calcification. Skull: Normal. Negative for fracture or focal lesion. Sinuses/Orbits: No acute finding. Other: None. IMPRESSION: 1. No acute findings. 2. Minimal chronic ischemic microvascular disease. Electronically Signed   By: Elberta Fortis M.D.   On: 04/19/2023 08:55    Procedures Procedures    Medications Ordered in ED Medications - No data to display  ED Course/ Medical Decision Making/ A&P                             Medical Decision Making Amount and/or Complexity of Data Reviewed Radiology: ordered.   Mechanical fall -- isolated head injury CT Head ordered and reviewed, showing no emergent findings.  No other acute traumatic injuries noted on arrival Patient appears to be at baseline, with recent hospital discharge yesterday Okay to return to SNF  Supplemental history provided by EMS External records reviewed - hospital discharge summary yesterday        Final Clinical Impression(s) / ED Diagnoses Final diagnoses:  Fall, initial encounter  Injury of head, initial encounter    Rx / DC Orders ED Discharge Orders     None         Allisha Harter, Kermit Balo, MD 04/19/23 [phone]

## 2023-04-19 NOTE — ED Notes (Signed)
Pt transported back to facility via ems at this time. All of belongings placed onto ems stretcher. VSS,NAD.

## 2023-04-19 NOTE — ED Notes (Signed)
[facility] called for transport 

## 2023-04-19 NOTE — Discharge Instructions (Signed)
There were no emergency findings on the patient's CT scan of the head today.

## 2023-04-19 NOTE — Progress Notes (Signed)
   04/19/23 0800  Spiritual Encounters  Type of Visit Initial  Care provided to: Pt and family  Referral source Code page  Reason for visit Code  OnCall Visit Yes   Ch responded to code page. Family was at bedside. Ch provided emotional support. No follow-up needed at this time.

## 2023-04-19 NOTE — ED Triage Notes (Addendum)
Pt BIB [facility] from [facility] d/t a mechanical  fall. Pt fell out of the bed while attempting reaching the call bell.hit his head. Pt is on blood thinner. No LOC. A&O X4.    Pulse 72  Cbg 185  BP 111/75

## 2023-05-01 ENCOUNTER — Telehealth: Payer: Self-pay

## 2023-05-01 NOTE — Telephone Encounter (Signed)
Received call from [facility] with [name] requesting fax number for labs to be sent to. Provided her with triage fax.  Sandie Ano, RN

## 2023-05-06 ENCOUNTER — Observation Stay (HOSPITAL_COMMUNITY): Payer: Medicare Other

## 2023-05-06 ENCOUNTER — Other Ambulatory Visit: Payer: Self-pay

## 2023-05-06 ENCOUNTER — Inpatient Hospital Stay (HOSPITAL_COMMUNITY)
Admission: EM | Admit: 2023-05-06 | Discharge: 2023-05-13 | DRG: 314 | Disposition: A | Discharge status: Skilled Nursing Facility | Payer: Medicare Other | Source: Skilled Nursing Facility | Attending: Internal Medicine | Admitting: Internal Medicine

## 2023-05-06 ENCOUNTER — Encounter (HOSPITAL_COMMUNITY): Payer: Self-pay

## 2023-05-06 ENCOUNTER — Emergency Department (HOSPITAL_COMMUNITY): Payer: Medicare Other

## 2023-05-06 DIAGNOSIS — I255 Ischemic cardiomyopathy: Secondary | ICD-10-CM | POA: Diagnosis present

## 2023-05-06 DIAGNOSIS — Z833 Family history of diabetes mellitus: Secondary | ICD-10-CM

## 2023-05-06 DIAGNOSIS — E785 Hyperlipidemia, unspecified: Secondary | ICD-10-CM | POA: Diagnosis present

## 2023-05-06 DIAGNOSIS — T8383XA Hemorrhage of genitourinary prosthetic devices, implants and grafts, initial encounter: Secondary | ICD-10-CM | POA: Diagnosis not present

## 2023-05-06 DIAGNOSIS — Z1612 Extended spectrum beta lactamase (ESBL) resistance: Secondary | ICD-10-CM | POA: Diagnosis present

## 2023-05-06 DIAGNOSIS — R319 Hematuria, unspecified: Secondary | ICD-10-CM | POA: Diagnosis present

## 2023-05-06 DIAGNOSIS — Z961 Presence of intraocular lens: Secondary | ICD-10-CM | POA: Diagnosis present

## 2023-05-06 DIAGNOSIS — Z8546 Personal history of malignant neoplasm of prostate: Secondary | ICD-10-CM

## 2023-05-06 DIAGNOSIS — Z8673 Personal history of transient ischemic attack (TIA), and cerebral infarction without residual deficits: Secondary | ICD-10-CM

## 2023-05-06 DIAGNOSIS — Z794 Long term (current) use of insulin: Secondary | ICD-10-CM

## 2023-05-06 DIAGNOSIS — N390 Urinary tract infection, site not specified: Secondary | ICD-10-CM | POA: Diagnosis present

## 2023-05-06 DIAGNOSIS — R569 Unspecified convulsions: Secondary | ICD-10-CM

## 2023-05-06 DIAGNOSIS — Z8249 Family history of ischemic heart disease and other diseases of the circulatory system: Secondary | ICD-10-CM

## 2023-05-06 DIAGNOSIS — E669 Obesity, unspecified: Secondary | ICD-10-CM | POA: Diagnosis present

## 2023-05-06 DIAGNOSIS — Q5564 Hidden penis: Secondary | ICD-10-CM

## 2023-05-06 DIAGNOSIS — E872 Acidosis, unspecified: Secondary | ICD-10-CM | POA: Diagnosis present

## 2023-05-06 DIAGNOSIS — E43 Unspecified severe protein-calorie malnutrition: Secondary | ICD-10-CM | POA: Diagnosis present

## 2023-05-06 DIAGNOSIS — Z888 Allergy status to other drugs, medicaments and biological substances status: Secondary | ICD-10-CM

## 2023-05-06 DIAGNOSIS — Y846 Urinary catheterization as the cause of abnormal reaction of the patient, or of later complication, without mention of misadventure at the time of the procedure: Secondary | ICD-10-CM | POA: Diagnosis not present

## 2023-05-06 DIAGNOSIS — I2510 Atherosclerotic heart disease of native coronary artery without angina pectoris: Secondary | ICD-10-CM | POA: Diagnosis present

## 2023-05-06 DIAGNOSIS — E860 Dehydration: Secondary | ICD-10-CM | POA: Diagnosis present

## 2023-05-06 DIAGNOSIS — E871 Hypo-osmolality and hyponatremia: Secondary | ICD-10-CM | POA: Diagnosis present

## 2023-05-06 DIAGNOSIS — N401 Enlarged prostate with lower urinary tract symptoms: Secondary | ICD-10-CM | POA: Diagnosis present

## 2023-05-06 DIAGNOSIS — I252 Old myocardial infarction: Secondary | ICD-10-CM

## 2023-05-06 DIAGNOSIS — Z6832 Body mass index (BMI) 32.0-32.9, adult: Secondary | ICD-10-CM

## 2023-05-06 DIAGNOSIS — E8809 Other disorders of plasma-protein metabolism, not elsewhere classified: Secondary | ICD-10-CM | POA: Diagnosis present

## 2023-05-06 DIAGNOSIS — N5089 Other specified disorders of the male genital organs: Secondary | ICD-10-CM | POA: Diagnosis present

## 2023-05-06 DIAGNOSIS — B356 Tinea cruris: Secondary | ICD-10-CM | POA: Diagnosis present

## 2023-05-06 DIAGNOSIS — I959 Hypotension, unspecified: Secondary | ICD-10-CM | POA: Diagnosis not present

## 2023-05-06 DIAGNOSIS — Z9981 Dependence on supplemental oxygen: Secondary | ICD-10-CM

## 2023-05-06 DIAGNOSIS — Z79899 Other long term (current) drug therapy: Secondary | ICD-10-CM

## 2023-05-06 DIAGNOSIS — Z7952 Long term (current) use of systemic steroids: Secondary | ICD-10-CM

## 2023-05-06 DIAGNOSIS — R4182 Altered mental status, unspecified: Secondary | ICD-10-CM | POA: Diagnosis present

## 2023-05-06 DIAGNOSIS — L89153 Pressure ulcer of sacral region, stage 3: Secondary | ICD-10-CM | POA: Diagnosis present

## 2023-05-06 DIAGNOSIS — Z947 Corneal transplant status: Secondary | ICD-10-CM

## 2023-05-06 DIAGNOSIS — E1142 Type 2 diabetes mellitus with diabetic polyneuropathy: Secondary | ICD-10-CM | POA: Diagnosis present

## 2023-05-06 DIAGNOSIS — Z7901 Long term (current) use of anticoagulants: Secondary | ICD-10-CM

## 2023-05-06 DIAGNOSIS — Z66 Do not resuscitate: Secondary | ICD-10-CM | POA: Diagnosis present

## 2023-05-06 DIAGNOSIS — I110 Hypertensive heart disease with heart failure: Secondary | ICD-10-CM | POA: Diagnosis present

## 2023-05-06 DIAGNOSIS — Z955 Presence of coronary angioplasty implant and graft: Secondary | ICD-10-CM

## 2023-05-06 DIAGNOSIS — E2740 Unspecified adrenocortical insufficiency: Secondary | ICD-10-CM | POA: Diagnosis present

## 2023-05-06 DIAGNOSIS — G4733 Obstructive sleep apnea (adult) (pediatric): Secondary | ICD-10-CM | POA: Diagnosis present

## 2023-05-06 DIAGNOSIS — I480 Paroxysmal atrial fibrillation: Secondary | ICD-10-CM | POA: Diagnosis present

## 2023-05-06 DIAGNOSIS — G9341 Metabolic encephalopathy: Secondary | ICD-10-CM | POA: Diagnosis present

## 2023-05-06 DIAGNOSIS — I7121 Aneurysm of the ascending aorta, without rupture: Secondary | ICD-10-CM | POA: Diagnosis present

## 2023-05-06 DIAGNOSIS — Z713 Dietary counseling and surveillance: Secondary | ICD-10-CM

## 2023-05-06 DIAGNOSIS — I5042 Chronic combined systolic (congestive) and diastolic (congestive) heart failure: Secondary | ICD-10-CM | POA: Diagnosis present

## 2023-05-06 DIAGNOSIS — Z89512 Acquired absence of left leg below knee: Secondary | ICD-10-CM

## 2023-05-06 DIAGNOSIS — D638 Anemia in other chronic diseases classified elsewhere: Secondary | ICD-10-CM | POA: Diagnosis present

## 2023-05-06 LAB — PHOSPHORUS: Phosphorus: 3 mg/dL (ref 2.5–4.6)

## 2023-05-06 LAB — COMPREHENSIVE METABOLIC PANEL
ALT: 16 U/L (ref 0–44)
ALT: 16 U/L (ref 0–44)
AST: 26 U/L (ref 15–41)
AST: 25 U/L (ref 15–41)
Albumin: 2.4 g/dL — ABNORMAL LOW (ref 3.5–5.0)
Albumin: 2.3 g/dL — ABNORMAL LOW (ref 3.5–5.0)
Alkaline Phosphatase: 94 U/L (ref 38–126)
Alkaline Phosphatase: 82 U/L (ref 38–126)
Anion gap: 10 (ref 5–15)
Anion gap: 7 (ref 5–15)
BUN: 20 mg/dL (ref 8–23)
BUN: 17 mg/dL (ref 8–23)
CO2: 19 mmol/L — ABNORMAL LOW (ref 22–32)
CO2: 22 mmol/L (ref 22–32)
Calcium: 8.3 mg/dL — ABNORMAL LOW (ref 8.9–10.3)
Calcium: 8.1 mg/dL — ABNORMAL LOW (ref 8.9–10.3)
Chloride: 104 mmol/L (ref 98–111)
Chloride: 104 mmol/L (ref 98–111)
Creatinine, Ser: 1.1 mg/dL (ref 0.61–1.24)
Creatinine, Ser: 1.01 mg/dL (ref 0.61–1.24)
GFR, Estimated: >60 mL/min (ref >60)
GFR, Estimated: >60 mL/min (ref >60)
Glucose, Bld: 184 mg/dL — ABNORMAL HIGH (ref 70–99)
Glucose, Bld: 99 mg/dL (ref 70–99)
Potassium: 4.1 mmol/L (ref 3.5–5.1)
Potassium: 3.8 mmol/L (ref 3.5–5.1)
Sodium: 133 mmol/L — ABNORMAL LOW (ref 135–145)
Sodium: 133 mmol/L — ABNORMAL LOW (ref 135–145)
Total Bilirubin: 0.9 mg/dL (ref 0.3–1.2)
Total Bilirubin: 0.8 mg/dL (ref 0.3–1.2)
Total Protein: 6.1 g/dL — ABNORMAL LOW (ref 6.5–8.1)
Total Protein: 5.4 g/dL — ABNORMAL LOW (ref 6.5–8.1)

## 2023-05-06 LAB — CBC WITH DIFFERENTIAL/PLATELET
Abs Immature Granulocytes: 0.03 10*3/uL (ref 0.00–0.07)
Basophils Absolute: 0.1 10*3/uL (ref 0.0–0.1)
Basophils Relative: 1 %
Eosinophils Absolute: 0.5 10*3/uL (ref 0.0–0.5)
Eosinophils Relative: 6 %
HCT: 28.7 % — ABNORMAL LOW (ref 39.0–52.0)
Hemoglobin: 8.5 g/dL — ABNORMAL LOW (ref 13.0–17.0)
Immature Granulocytes: 0 %
Lymphocytes Relative: 18 %
Lymphs Abs: 1.4 10*3/uL (ref 0.7–4.0)
MCH: 24 pg — ABNORMAL LOW (ref 26.0–34.0)
MCHC: 29.6 g/dL — ABNORMAL LOW (ref 30.0–36.0)
MCV: 81.1 fL (ref 80.0–100.0)
Monocytes Absolute: 0.8 10*3/uL (ref 0.1–1.0)
Monocytes Relative: 10 %
Neutro Abs: 5 10*3/uL (ref 1.7–7.7)
Neutrophils Relative %: 65 %
Platelets: 206 10*3/uL (ref 150–400)
RBC: 3.54 MIL/uL — ABNORMAL LOW (ref 4.22–5.81)
RDW: 22.5 % — ABNORMAL HIGH (ref 11.5–15.5)
Smear Review: NORMAL
WBC: 7.8 10*3/uL (ref 4.0–10.5)
nRBC: 0 % (ref 0.0–0.2)

## 2023-05-06 LAB — URINALYSIS, ROUTINE W REFLEX MICROSCOPIC
Bilirubin Urine: NEGATIVE
Glucose, UA: NEGATIVE mg/dL
Ketones, ur: NEGATIVE mg/dL
Nitrite: NEGATIVE
Protein, ur: 100 mg/dL — AB
Specific Gravity, Urine: 1.016 (ref 1.005–1.030)
pH: 5 (ref 5.0–8.0)

## 2023-05-06 LAB — LACTIC ACID, PLASMA
Lactic Acid, Venous: 2.9 mmol/L (ref 0.5–1.9)
Lactic Acid, Venous: 1.4 mmol/L (ref 0.5–1.9)

## 2023-05-06 LAB — CBC
HCT: 30.7 % — ABNORMAL LOW (ref 39.0–52.0)
Hemoglobin: 9.1 g/dL — ABNORMAL LOW (ref 13.0–17.0)
MCH: 24.1 pg — ABNORMAL LOW (ref 26.0–34.0)
MCHC: 29.6 g/dL — ABNORMAL LOW (ref 30.0–36.0)
MCV: 81.2 fL (ref 80.0–100.0)
Platelets: 203 10*3/uL (ref 150–400)
RBC: 3.78 MIL/uL — ABNORMAL LOW (ref 4.22–5.81)
RDW: 22.3 % — ABNORMAL HIGH (ref 11.5–15.5)
WBC: 9.2 10*3/uL (ref 4.0–10.5)
nRBC: 0 % (ref 0.0–0.2)

## 2023-05-06 LAB — URINALYSIS, MICROSCOPIC (REFLEX)
Bacteria, UA: NONE SEEN
RBC / HPF: >50 RBC/hpf (ref 0–5)
WBC, UA: >50 WBC/hpf (ref 0–5)

## 2023-05-06 LAB — MAGNESIUM
Magnesium: 1.7 mg/dL (ref 1.7–2.4)
Magnesium: 1.6 mg/dL — ABNORMAL LOW (ref 1.7–2.4)

## 2023-05-06 LAB — GLUCOSE, CAPILLARY
Glucose-Capillary: 157 mg/dL — ABNORMAL HIGH (ref 70–99)
Glucose-Capillary: 142 mg/dL — ABNORMAL HIGH (ref 70–99)

## 2023-05-06 LAB — CK: Total CK: 123 U/L (ref 49–397)

## 2023-05-06 MED ORDER — GADOBUTROL 1 MMOL/ML IV SOLN
10.0000 mL | Freq: Once | INTRAVENOUS | Status: AC | PRN
  Administered 2023-05-06: 10 mL via INTRAVENOUS

## 2023-05-06 MED ORDER — SODIUM CHLORIDE 0.9 % IV BOLUS (SEPSIS)
1000.0000 mL | Freq: Once | INTRAVENOUS | Status: AC
  Administered 2023-05-06: 1000 mL via INTRAVENOUS

## 2023-05-06 MED ORDER — SODIUM CHLORIDE 0.9 % IV SOLN
800.0000 mg | Freq: Every day | INTRAVENOUS | Status: DC
  Administered 2023-05-07 – 2023-05-12 (×7): 800 mg via INTRAVENOUS
  Filled 2023-05-06 (×9): qty 16

## 2023-05-06 MED ORDER — INSULIN ASPART 100 UNIT/ML IJ SOLN: 0.0000 [IU] | Freq: Every day | INTRAMUSCULAR | Status: DC

## 2023-05-06 MED ORDER — FERROUS SULFATE 325 (65 FE) MG PO TABS
325.0000 mg | ORAL_TABLET | Freq: Every day | ORAL | Status: DC
  Administered 2023-05-07 – 2023-05-10 (×4): 325 mg via ORAL
  Filled 2023-05-06 (×4): qty 1

## 2023-05-06 MED ORDER — APIXABAN 5 MG PO TABS
5.0000 mg | ORAL_TABLET | Freq: Two times a day (BID) | ORAL | Status: DC
  Administered 2023-05-06 – 2023-05-08 (×5): 5 mg via ORAL
  Filled 2023-05-06 (×6): qty 1

## 2023-05-06 MED ORDER — MAGNESIUM SULFATE 2 GM/50ML IV SOLN
2.0000 g | Freq: Once | INTRAVENOUS | Status: AC
  Administered 2023-05-06: 2 g via INTRAVENOUS
  Filled 2023-05-06: qty 50

## 2023-05-06 MED ORDER — ENSURE ENLIVE PO LIQD
237.0000 mL | Freq: Three times a day (TID) | ORAL | Status: DC
  Administered 2023-05-06 – 2023-05-13 (×19): 237 mL via ORAL

## 2023-05-06 MED ORDER — MIDODRINE HCL 5 MG PO TABS
10.0000 mg | ORAL_TABLET | Freq: Three times a day (TID) | ORAL | Status: DC
  Administered 2023-05-06 – 2023-05-13 (×21): 10 mg via ORAL
  Filled 2023-05-06 (×20): qty 2

## 2023-05-06 MED ORDER — GABAPENTIN 300 MG PO CAPS
300.0000 mg | ORAL_CAPSULE | Freq: Every day | ORAL | Status: DC
  Administered 2023-05-06 – 2023-05-12 (×7): 300 mg via ORAL
  Filled 2023-05-06 (×7): qty 1

## 2023-05-06 MED ORDER — OXYCODONE HCL 5 MG PO TABS
5.0000 mg | ORAL_TABLET | ORAL | Status: DC | PRN
  Administered 2023-05-06 – 2023-05-09 (×3): 5 mg via ORAL
  Filled 2023-05-06 (×3): qty 1

## 2023-05-06 MED ORDER — PANTOPRAZOLE SODIUM 40 MG PO TBEC
40.0000 mg | DELAYED_RELEASE_TABLET | Freq: Every day | ORAL | Status: DC
  Administered 2023-05-07 – 2023-05-13 (×7): 40 mg via ORAL
  Filled 2023-05-06 (×7): qty 1

## 2023-05-06 MED ORDER — HYDROCORTISONE 5 MG PO TABS
5.0000 mg | ORAL_TABLET | Freq: Two times a day (BID) | ORAL | Status: DC
  Administered 2023-05-06 – 2023-05-07 (×2): 5 mg via ORAL
  Filled 2023-05-06 (×2): qty 1

## 2023-05-06 MED ORDER — FINASTERIDE 5 MG PO TABS
5.0000 mg | ORAL_TABLET | Freq: Every day | ORAL | Status: DC
  Administered 2023-05-07 – 2023-05-13 (×7): 5 mg via ORAL
  Filled 2023-05-06 (×7): qty 1

## 2023-05-06 MED ORDER — CHLORHEXIDINE GLUCONATE CLOTH 2 % EX PADS
6.0000 | MEDICATED_PAD | Freq: Every day | CUTANEOUS | Status: DC
  Administered 2023-05-07 – 2023-05-13 (×7): 6 via TOPICAL

## 2023-05-06 MED ORDER — OXYCODONE HCL ER 15 MG PO T12A
15.0000 mg | EXTENDED_RELEASE_TABLET | Freq: Two times a day (BID) | ORAL | Status: DC
  Administered 2023-05-06 – 2023-05-13 (×14): 15 mg via ORAL
  Filled 2023-05-06 (×16): qty 1

## 2023-05-06 MED ORDER — TAMSULOSIN HCL 0.4 MG PO CAPS
0.4000 mg | ORAL_CAPSULE | Freq: Every day | ORAL | Status: DC
  Administered 2023-05-07 – 2023-05-13 (×7): 0.4 mg via ORAL
  Filled 2023-05-06 (×7): qty 1

## 2023-05-06 MED ORDER — FLUCONAZOLE 100 MG PO TABS: 100.0000 mg | ORAL_TABLET | Freq: Every day | ORAL | Status: DC

## 2023-05-06 MED ORDER — MIDODRINE HCL 5 MG PO TABS
10.0000 mg | ORAL_TABLET | Freq: Three times a day (TID) | ORAL | Status: DC
  Administered 2023-05-06: 10 mg via ORAL
  Filled 2023-05-06: qty 2

## 2023-05-06 MED ORDER — SODIUM CHLORIDE 0.9 % IV SOLN
1.0000 g | Freq: Three times a day (TID) | INTRAVENOUS | Status: DC
  Administered 2023-05-06 – 2023-05-13 (×22): 1 g via INTRAVENOUS
  Filled 2023-05-06 (×22): qty 20

## 2023-05-06 MED ORDER — SACCHAROMYCES BOULARDII 250 MG PO CAPS
250.0000 mg | ORAL_CAPSULE | Freq: Two times a day (BID) | ORAL | Status: DC
  Administered 2023-05-06 – 2023-05-13 (×14): 250 mg via ORAL
  Filled 2023-05-06 (×14): qty 1

## 2023-05-06 MED ORDER — SODIUM CHLORIDE 0.9 % IV SOLN: 800.0000 mg | Freq: Every day | INTRAVENOUS | Status: DC

## 2023-05-06 MED ORDER — FENTANYL CITRATE PF 50 MCG/ML IJ SOSY
50.0000 ug | PREFILLED_SYRINGE | Freq: Once | INTRAMUSCULAR | Status: AC
  Administered 2023-05-06: 50 ug via INTRAVENOUS
  Filled 2023-05-06: qty 1

## 2023-05-06 MED ORDER — ZINC SULFATE 220 (50 ZN) MG PO CAPS
220.0000 mg | ORAL_CAPSULE | Freq: Every day | ORAL | Status: DC
  Administered 2023-05-07 – 2023-05-13 (×7): 220 mg via ORAL
  Filled 2023-05-06 (×7): qty 1

## 2023-05-06 MED ORDER — SENNOSIDES-DOCUSATE SODIUM 8.6-50 MG PO TABS
1.0000 | ORAL_TABLET | Freq: Two times a day (BID) | ORAL | Status: DC
  Administered 2023-05-07 – 2023-05-13 (×4): 1 via ORAL
  Filled 2023-05-06 (×12): qty 1

## 2023-05-06 MED ORDER — INSULIN GLARGINE-YFGN 100 UNIT/ML ~~LOC~~ SOLN
20.0000 [IU] | Freq: Every day | SUBCUTANEOUS | Status: DC
  Administered 2023-05-06 – 2023-05-12 (×7): 20 [IU] via SUBCUTANEOUS
  Filled 2023-05-06 (×8): qty 0.2

## 2023-05-06 MED ORDER — DOCUSATE SODIUM 100 MG PO CAPS
100.0000 mg | ORAL_CAPSULE | Freq: Two times a day (BID) | ORAL | Status: DC
  Administered 2023-05-07 – 2023-05-13 (×4): 100 mg via ORAL
  Filled 2023-05-06 (×13): qty 1

## 2023-05-06 MED ORDER — ATORVASTATIN CALCIUM 80 MG PO TABS
80.0000 mg | ORAL_TABLET | Freq: Every day | ORAL | Status: DC
  Administered 2023-05-07 – 2023-05-13 (×7): 80 mg via ORAL
  Filled 2023-05-06 (×7): qty 1

## 2023-05-06 MED ORDER — SODIUM CHLORIDE 0.9 % IV BOLUS
250.0000 mL | Freq: Once | INTRAVENOUS | Status: AC
  Administered 2023-05-06: 250 mL via INTRAVENOUS

## 2023-05-06 MED ORDER — HYDROMORPHONE HCL 1 MG/ML IJ SOLN
0.5000 mg | INTRAMUSCULAR | Status: DC | PRN
  Administered 2023-05-10 – 2023-05-13 (×8): 0.5 mg via INTRAVENOUS
  Filled 2023-05-06 (×8): qty 0.5

## 2023-05-06 MED ORDER — B COMPLEX-C PO TABS
1.0000 | ORAL_TABLET | Freq: Every day | ORAL | Status: DC
  Administered 2023-05-07 – 2023-05-13 (×7): 1 via ORAL
  Filled 2023-05-06 (×7): qty 1

## 2023-05-06 MED ORDER — ALBUTEROL SULFATE (2.5 MG/3ML) 0.083% IN NEBU: 2.5000 mg | INHALATION_SOLUTION | RESPIRATORY_TRACT | Status: DC | PRN

## 2023-05-06 MED ORDER — BISACODYL 10 MG RE SUPP: 10.0000 mg | Freq: Every day | RECTAL | Status: DC | PRN

## 2023-05-06 MED ORDER — HYDRALAZINE HCL 20 MG/ML IJ SOLN: 10.0000 mg | Freq: Four times a day (QID) | INTRAMUSCULAR | Status: DC | PRN

## 2023-05-06 MED ORDER — LORAZEPAM 2 MG/ML IJ SOLN: 2.0000 mg | Freq: Four times a day (QID) | INTRAMUSCULAR | Status: DC | PRN

## 2023-05-06 MED ORDER — PRO-STAT SUGAR FREE PO LIQD
30.0000 mL | Freq: Every day | ORAL | Status: DC
  Filled 2023-05-06: qty 30

## 2023-05-06 MED ORDER — INSULIN ASPART 100 UNIT/ML IJ SOLN
0.0000 [IU] | Freq: Three times a day (TID) | INTRAMUSCULAR | Status: DC
  Administered 2023-05-06 – 2023-05-07 (×2): 3 [IU] via SUBCUTANEOUS
  Administered 2023-05-07: 2 [IU] via SUBCUTANEOUS
  Administered 2023-05-08 (×2): 3 [IU] via SUBCUTANEOUS
  Administered 2023-05-08: 2 [IU] via SUBCUTANEOUS
  Administered 2023-05-09: 5 [IU] via SUBCUTANEOUS
  Administered 2023-05-09: 2 [IU] via SUBCUTANEOUS
  Administered 2023-05-09 – 2023-05-11 (×5): 3 [IU] via SUBCUTANEOUS
  Administered 2023-05-11: 2 [IU] via SUBCUTANEOUS
  Administered 2023-05-12: 3 [IU] via SUBCUTANEOUS
  Administered 2023-05-12: 2 [IU] via SUBCUTANEOUS
  Administered 2023-05-12 – 2023-05-13 (×2): 3 [IU] via SUBCUTANEOUS
  Administered 2023-05-13: 2 [IU] via SUBCUTANEOUS
  Administered 2023-05-13: 3 [IU] via SUBCUTANEOUS

## 2023-05-06 NOTE — Progress Notes (Signed)
EEG complete - results pending 

## 2023-05-06 NOTE — Progress Notes (Signed)
Pharmacy Antibiotic Note  Peter Yang is a 67 y.o. male admitted on 05/06/2023 with shaking and confusion, hx of fournier's gangrene with osteo followed by ID on ertapenem and daptomycin PTA with plan through 7/30.  Pharmacy has been consulted for Merrem and daptomycin dosing.  Plan: Merrem 1g IV q 8h Daptomycin 800 mg IV once daily Monitor renal function, MRI, clinical progression  Height: 6\' 2"  (188 cm) Weight: 110 kg (242 lb 8.1 oz) IBW/kg (Calculated) : 82.2  Temp (24hrs), Avg:98.2 F (36.8 C), Min:97.8 F (36.6 C), Max:98.6 F (37 C)  Recent Labs  Lab 05/06/23 0058 05/06/23 0100 05/06/23 0351  WBC 9.2  --   --   CREATININE 1.10  --   --   LATICACIDVEN  --  2.9* 1.4    Estimated Creatinine Clearance: 87.2 mL/min (by C-G formula based on SCr of 1.1 mg/dL).    Allergies  Allergen Reactions   Sglt2 Inhibitors     Fournier's gangrene    Daylene Posey, PharmD, [facility] Clinical Pharmacist ED Pharmacist Phone # [phone] 05/06/2023 8:38 AM

## 2023-05-06 NOTE — Procedures (Signed)
Patient Name: Peter Yang  MRN: [id number]  Epilepsy Attending: Charlsie Quest  Referring Physician/Provider: Merlene Laughter, DO  Date: 05/06/2023  Duration: 23.22 mins  Patient history: 67yo M witnessed to be unresponsive at his nursing home with shaking activity that self resolved without any focal deficits noted, and was confused/likely postictal afterwards with gradual recovery to baseline. EEG to evaluate for seizure.  Level of alertness: Awake, asleep  AEDs during EEG study: None  Technical aspects: This EEG study was done with scalp electrodes positioned according to the 10-20 International system of electrode placement. Electrical activity was reviewed with band pass filter of 1-70Hz , sensitivity of 7 uV/mm, display speed of 86mm/sec with a 60Hz  notched filter applied as appropriate. EEG data were recorded continuously and digitally stored.  Video monitoring was available and reviewed as appropriate.  Description: The posterior dominant rhythm consists of 7 Hz activity of moderate voltage (25-35 uV) seen predominantly in posterior head regions, symmetric and reactive to eye opening and eye closing. Sleep was characterized by vertex waves, sleep spindles (12 to 14 Hz), maximal frontocentral region. EEG showed continuous generalized 5 to 7 Hz theta slowing. Physiologic photic driving was seen during photic stimulation. Hyperventilation was not performed.     ABNORMALITY - Continuous slow, generalized - Background slow  IMPRESSION: This study is suggestive of mild to moderate diffuse encephalopathy, nonspecific etiology. No seizures or epileptiform discharges were seen throughout the recording.  Jhania Etherington Annabelle Harman

## 2023-05-06 NOTE — Plan of Care (Signed)

## 2023-05-06 NOTE — ED Notes (Signed)
ED TO INPATIENT HANDOFF REPORT  ED Nurse Name and Phone #: Nicholos Johns [phone]  S Name/Age/Gender Peter Yang 67 y.o. male Room/Bed: 046C/046C  Code Status   Code Status: DNR  Home/SNF/Other Skilled nursing facility Patient oriented to: self, place, time, and situation Is this baseline? Yes   Triage Complete: Triage complete  Chief Complaint Seizure-like activity (HCC) [R56.9] Altered mental status [R41.82]  Triage Note Pt presents via EMS from [place] c/o "unresponsive" EPISODE AT snf. EMS reports pt was unresponsive upon arrival to SNF. Reports had episode pf "shaking" per EMS. Pt on PO abx for recent right foot partial amputation.    Allergies Allergies  Allergen Reactions   Sglt2 Inhibitors     Fournier's gangrene    Level of Care/Admitting Diagnosis ED Disposition     ED Disposition  Admit   Condition  --   Comment  Hospital Area: [facility] [[id number]]  Level of Care: Progressive [102]  Admit to Progressive based on following criteria: COMPLICATED UROLOGY Patients requiring frequent assessments and interventions, such as continuous bladder irrigations, immediate post-op surgical procedures, i.e., bladder removal/ileal conduit.  May place patient in observation at [facility] or [facility] if equivalent level of care is available:: Yes  Covid Evaluation: Asymptomatic - no recent exposure (last 10 days) testing not required  Diagnosis: Altered mental status [780.97.ICD-9-CM]  Admitting Physician: Marguerita Merles LATIF [[id number]]  Attending Physician: Marguerita Merles LATIF [[id number]]          B Medical/Surgery History 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 Overview:  Added automatically from request for surgery [id number] Formatting of this note might be different from the original. Added automatically from request for surgery [id number]   Large bowel perforation (HCC) 09/27/2015   Malignant neoplasm of prostate (HCC) 10/22/2019   Myopia with astigmatism and presbyopia, bilateral 06/15/2018   Last Assessment & Plan:  Formatting of this note might be different from the original. - Wrx printed Formatting of this note might be different from the original. 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 - Stable, monitor Formatting of this note might be different from the original. Last Assessment & Plan:  - Stable, monitor   Retroperitoneal abscess (HCC) 10/02/2015   Status post corneal transplant 10/07/2016   Formatting of this note might be different from the original. Overview:  Added automatically from request for surgery [id number] Formatting of this note might be different from the original.  Added automatically from request for surgery [id number]  Last Assessment & Plan:  Formatting of this note might be different from the original. - OD, 2/2 KCN - Stable, no NVK / rejection - Continue off  steroids - Mo   Thoracic ascending aortic aneurysm (HCC)    TIA (transient ischemic attack) 2012   Type 2 diabetes mellitus [facility])    Past Surgical History:  Procedure Laterality Date   BELOW KNEE LEG AMPUTATION Left 2022   CORONARY ANGIOPLASTY WITH STENT PLACEMENT  01/2022   DES RCA   IRRIGATION AND DEBRIDEMENT ABSCESS N/A 04/02/2023   Procedure: IRRIGATION AND DEBRIDEMENT ABSCESS;  Surgeon: Crist Fat, MD;  Location: [facility] OR;  Service: Urology;  Laterality: N/A;   IRRIGATION AND DEBRIDEMENT ABSCESS N/A 04/09/2023   Procedure: SCROTAL WASHOUT AND DEBRIDEMENT WITH WOUND CLOSURE;  Surgeon: Crist Fat, MD;  Location[facility] OR;  Service: Urology;  Laterality: N/A;  60 MINUTES   LEFT HEART CATH AND CORONARY ANGIOGRAPHY N/A 06/17/2022   Procedure: LEFT HEART CATH AND CORONARY ANGIOGRAPHY;  Surgeon: Yvonne Kendall, MD;  Location: [facility] INVASIVE CV LAB;  Service: Cardiovascular;  Laterality: N/A;   ROTATOR CUFF REPAIR Left      A IV Location/Drains/Wounds Patient Lines/Drains/Airways Status     Active Line/Drains/Airways     Name Placement date Placement time Site Days   Peripheral IV 05/06/23 Posterior;Right Wrist 05/06/23  0202  Wrist  less than 1   PICC Single Lumen 04/10/23 Right Basilic 43 cm 0 cm 04/10/23  3244  Basilic  26   Urethral Catheter Dr. Marlou Porch Latex;Straight-tip 16 Fr. 04/09/23  1725  Latex;Straight-tip  27   Pressure Injury 08/03/22 Buttocks Mid Stage 2 -  Partial thickness loss of dermis presenting as a shallow open injury with a red, pink wound bed without slough. area of red blanchable and non blanchable, and areas with skin sloughed off 08/03/22  1513  -- 276   Wound / Incision (Open or Dehisced) 04/02/23 Diabetic ulcer Knee Anterior;Right 04/02/23  0103  Knee  34   Wound / Incision (Open or Dehisced) 04/02/23 Skin tear Penis 04/02/23  0108  Penis  34   Wound / Incision (Open or Dehisced) 04/02/23 Amputation Toe (Comment  which one) Anterior;Right 04/02/23   0830  Toe (Comment  which one)  34   Wound / Incision (Open or Dehisced) Buttocks Right --  --  Buttocks  --            Intake/Output Last 24 hours No intake or output data in the 24 hours ending 05/06/23 1159  Labs/Imaging Results for orders placed or performed during the hospital encounter of 05/06/23 (from the past 48 hour(s))  Comprehensive metabolic panel     Status: Abnormal   Collection Time: 05/06/23 12:58 AM  Result Value Ref Range   Sodium 133 (L) 135 - 145 mmol/L   Potassium 4.1 3.5 - 5.1 mmol/L   Chloride 104 98 - 111 mmol/L   CO2 19 (L) 22 - 32 mmol/L   Glucose, Bld 184 (H) 70 - 99 mg/dL    Comment: Glucose reference range applies only to samples taken after fasting for at least 8 hours.   BUN 20 8 - 23 mg/dL   Creatinine, Ser 0.10 0.61 - 1.24 mg/dL   Calcium 8.3 (L) 8.9 - 10.3 mg/dL   Total Protein 6.1 (L) 6.5 - 8.1 g/dL   Albumin 2.4 (L) 3.5 - 5.0 g/dL   AST 26 15 - 41 U/L  ALT 16 0 - 44 U/L   Alkaline Phosphatase 94 38 - 126 U/L   Total Bilirubin 0.9 0.3 - 1.2 mg/dL   GFR, Estimated >40 >98 mL/min    Comment: (NOTE) Calculated using the CKD-EPI Creatinine Equation (2021)    Anion gap 10 5 - 15    Comment: Performed at [facility], [address] [place], [place] [address]  CBC     Status: Abnormal   Collection Time: 05/06/23 12:58 AM  Result Value Ref Range   WBC 9.2 4.0 - 10.5 K/uL   RBC 3.78 (L) 4.22 - 5.81 MIL/uL   Hemoglobin 9.1 (L) 13.0 - 17.0 g/dL   HCT 78.2 (L) 95.6 - 21.3 %   MCV 81.2 80.0 - 100.0 fL   MCH 24.1 (L) 26.0 - 34.0 pg   MCHC 29.6 (L) 30.0 - 36.0 g/dL   RDW 08.6 (H) 57.8 - 46.9 %   Platelets 203 150 - 400 K/uL   nRBC 0.0 0.0 - 0.2 %    Comment: Performed at [facility], [address], [place], [place] [address]  CK     Status: None   Collection Time: 05/06/23 12:58 AM  Result Value Ref Range   Total CK 123 49 - 397 U/L    Comment: Performed at [facility], [address] [place], [place] [address]   Magnesium     Status: None   Collection Time: 05/06/23 12:58 AM  Result Value Ref Range   Magnesium 1.7 1.7 - 2.4 mg/dL    Comment: Performed at [facility], [address] [place], [place] [address]  Lactic acid, plasma     Status: Abnormal   Collection Time: 05/06/23  1:00 AM  Result Value Ref Range   Lactic Acid, Venous 2.9 (HH) 0.5 - 1.9 mmol/L    Comment: CRITICAL RESULT CALLED TO, READ BACK BY AND VERIFIED WITH JULIA TURNBOW RN 05/06/23 0156 Enid Derry Performed at [facility], [address] [place], [place] [address]   Urinalysis, Routine w reflex microscopic -Urine, Catheterized; Indwelling urinary catheter     Status: Abnormal   Collection Time: 05/06/23  1:05 AM  Result Value Ref Range   Color, Urine RED (A) YELLOW    Comment: BIOCHEMICALS MAY BE AFFECTED BY COLOR   APPearance CLOUDY (A) CLEAR   Specific Gravity, Urine 1.016 1.005 - 1.030   pH 5.0 5.0 - 8.0   Glucose, UA NEGATIVE NEGATIVE mg/dL   Hgb urine dipstick LARGE (A) NEGATIVE   Bilirubin Urine NEGATIVE NEGATIVE   Ketones, ur NEGATIVE NEGATIVE mg/dL   Protein, ur 664 (A) NEGATIVE mg/dL   Nitrite NEGATIVE NEGATIVE   Leukocytes,Ua MODERATE (A) NEGATIVE    Comment: Performed at [facility], [address] [place], [place] [address]  Urinalysis, Microscopic (reflex)     Status: None   Collection Time: 05/06/23  1:05 AM  Result Value Ref Range   RBC / HPF >50 0 - 5 RBC/hpf   WBC, UA >50 0 - 5 WBC/hpf   Bacteria, UA NONE SEEN NONE SEEN   Squamous Epithelial / HPF 0-5 0 - 5 /HPF    Comment: Performed at [facility], [address] [place], [place] [address]  Lactic acid, plasma     Status: None   Collection Time: 05/06/23  3:51 AM  Result Value Ref Range   Lactic Acid, Venous 1.4 0.5 - 1.9 mmol/L  Comment: Performed at [facility], [address] [place], [place] [address]  CBC with Differential/Platelet     Status: Abnormal (Preliminary result)   Collection Time:  05/06/23  8:30 AM  Result Value Ref Range   WBC 7.8 4.0 - 10.5 K/uL   RBC 3.54 (L) 4.22 - 5.81 MIL/uL   Hemoglobin 8.5 (L) 13.0 - 17.0 g/dL   HCT 96.2 (L) 95.2 - 84.1 %   MCV 81.1 80.0 - 100.0 fL   MCH 24.0 (L) 26.0 - 34.0 pg   MCHC 29.6 (L) 30.0 - 36.0 g/dL   RDW 32.4 (H) 40.1 - 02.7 %   Platelets 206 150 - 400 K/uL   nRBC 0.0 0.0 - 0.2 %    Comment: Performed at [facility], [address] [place], [place] [address]   Neutrophils Relative % PENDING %   Neutro Abs PENDING 1.7 - 7.7 K/uL   Band Neutrophils PENDING %   Lymphocytes Relative PENDING %   Lymphs Abs PENDING 0.7 - 4.0 K/uL   Monocytes Relative PENDING %   Monocytes Absolute PENDING 0.1 - 1.0 K/uL   Eosinophils Relative PENDING %   Eosinophils Absolute PENDING 0.0 - 0.5 K/uL   Basophils Relative PENDING %   Basophils Absolute PENDING 0.0 - 0.1 K/uL   WBC Morphology PENDING    RBC Morphology PENDING    Smear Review PENDING    Other PENDING %   nRBC PENDING 0 /100 WBC   Metamyelocytes Relative PENDING %   Myelocytes PENDING %   Promyelocytes Relative PENDING %   Blasts PENDING %   Immature Granulocytes PENDING %   Abs Immature Granulocytes PENDING 0.00 - 0.07 K/uL   MR BRAIN W WO CONTRAST  Result Date: 05/06/2023 CLINICAL DATA:  Mental status change, unknown cause. EXAM: MRI HEAD WITHOUT AND WITH CONTRAST TECHNIQUE: Multiplanar, multiecho pulse sequences of the brain and surrounding structures were obtained without and with intravenous contrast. CONTRAST:  10mL GADAVIST GADOBUTROL 1 MMOL/ML IV SOLN COMPARISON:  MRI brain 07/28/2021.  Head CT 05/06/2023. FINDINGS: Brain: No acute infarct or hemorrhage. Unchanged encephalomalacia from prior infarct along the right temporoparietal junction. Encephalomalacia and hemosiderin staining from prior hemorrhage in the right corona radiata. No hydrocephalus or extra-axial collection. No mass or abnormal enhancement. Vascular: Normal flow voids and vessel enhancement. Skull  and upper cervical spine: Normal marrow signal and enhancement. Sinuses/Orbits: Unremarkable. Other: None. IMPRESSION: 1. No acute intracranial abnormality or mass. 2. Unchanged encephalomalacia from prior infarct along the right temporoparietal junction and right corona radiata. Electronically Signed   By: Orvan Falconer M.D.   On: 05/06/2023 10:27   CT Head Wo Contrast  Result Date: 05/06/2023 CLINICAL DATA:  Delirium EXAM: CT HEAD WITHOUT CONTRAST TECHNIQUE: Contiguous axial images were obtained from the base of the skull through the vertex without intravenous contrast. RADIATION DOSE REDUCTION: This exam was performed according to the departmental dose-optimization program which includes automated exposure control, adjustment of the mA and/or kV according to patient size and/or use of iterative reconstruction technique. COMPARISON:  04/19/2023 FINDINGS: Brain: No evidence of acute infarction, hemorrhage, hydrocephalus, extra-axial collection or mass lesion/mass effect. Small chronic cortex infarcts seen along the right cerebral convexity. Generalized brain atrophy. Vascular: No hyperdense vessel or unexpected calcification. Skull: Normal. Negative for fracture or focal lesion. Sinuses/Orbits: No acute finding. IMPRESSION: 1. No acute finding. 2. Small chronic infarcts scattered in the right cerebral cortex. Electronically Signed   By: Audry Riles.D.  On: 05/06/2023 04:06   DG Chest Portable 1 View  Result Date: 05/06/2023 CLINICAL DATA:  Weakness EXAM: PORTABLE CHEST 1 VIEW COMPARISON:  04/03/2023 FINDINGS: Pulmonary insufflation is stable. Diffusely increased opacity of the right hemithorax likely relates to posteriorly layering small to moderate pleural fluid on this supine examination. Moderate left pleural effusion also noted. Superimposed left basilar atelectasis or infiltrate is present. No definite pneumothorax. Right upper extremity PICC line tip seen at the superior cavoatrial junction.  Cardiac size within normal limits. Pulmonary vascularity is normal. No acute bone abnormality. IMPRESSION: 1. Small to moderate bilateral pleural effusions. 2. Left basilar atelectasis or infiltrate. Electronically Signed   By: Helyn Numbers M.D.   On: 05/06/2023 03:26    Pending Labs Unresulted Labs (From admission, onward)     Start     Ordered   05/06/23 0800  Comprehensive metabolic panel  Once,   R        05/06/23 0759   05/06/23 0800  Phosphorus  Once,   R        05/06/23 0759   05/06/23 0800  Magnesium  Once,   R        05/06/23 0759   Signed and Held  TSH  Tomorrow morning,   R        Signed and Held   Signed and Held  Comprehensive metabolic panel  Tomorrow morning,   R        Signed and Held   Signed and Held  CBC  Tomorrow morning,   R        Signed and Held            Vitals/Pain Today's Vitals   05/06/23 1035 05/06/23 1115 05/06/23 1120 05/06/23 1125  BP: (!) 78/52 (!) 52/33  106/70  Pulse: 75 77 74 72  Resp: 16 14 14 13   Temp: (!) 97.3 F (36.3 C)     TempSrc: Oral     SpO2: 98% 100% 100% 100%  Weight:      Height:      PainSc:        Isolation Precautions No active isolations  Medications Medications  LORazepam (ATIVAN) injection 2 mg (has no administration in time range)  DAPTOmycin (CUBICIN) 800 mg in sodium chloride 0.9 % IVPB (has no administration in time range)  meropenem (MERREM) 1 g in sodium chloride 0.9 % 100 mL IVPB (has no administration in time range)  midodrine (PROAMATINE) tablet 10 mg (10 mg Oral Given 05/06/23 1125)  fentaNYL (SUBLIMAZE) injection 50 mcg (50 mcg Intravenous Given 05/06/23 0231)  sodium chloride 0.9 % bolus 1,000 mL (0 mLs Intravenous Stopped 05/06/23 0419)  magnesium sulfate IVPB 2 g 50 mL (0 g Intravenous Stopped 05/06/23 1120)  gadobutrol (GADAVIST) 1 MMOL/ML injection 10 mL (10 mLs Intravenous Contrast Given 05/06/23 0941)  sodium chloride 0.9 % bolus 250 mL (250 mLs Intravenous New Bag/Given 05/06/23 1119)     Mobility non-ambulatory     Focused Assessments Cardiac Assessment Handoff:    Lab Results  Component Value Date   CKTOTAL 123 05/06/2023   Lab Results  Component Value Date   DDIMER 2.00 (H) 09/02/2022   Does the Patient currently have chest pain? No    R Recommendations: See Admitting Provider Note  Report given to:   Additional Notes: .

## 2023-05-06 NOTE — Progress Notes (Signed)
Pt in MRI messaged RN to notify EEG team when pt is back in the room. Will try back as schedule permits for EEG

## 2023-05-06 NOTE — ED Provider Notes (Signed)
[place] EMERGENCY DEPARTMENT AT [facility] Provider Note   CSN: [id number] Arrival date & time: 05/06/23  0039     History  Chief Complaint  Patient presents with   Altered Mental Status    Rayson Rando is a 67 y.o. male.  The history is provided by the patient.  Patient with extensive history including CAD, diabetes, previous amputations, recent history of Fournier's gangrene presents with altered mental status.  It is reported the patient had an unresponsive episode and shaking. Patient is currently awake/alert does not recall this episode.  He denies any fevers or vomiting.  No headache, no chest pain or shortness of breath.  No abdominal pain. 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 Overview:  Added automatically from request for surgery [id number] Formatting of this note might be different from the original. Added automatically from request for surgery [id number]   Large bowel perforation (HCC) 09/27/2015   Malignant neoplasm of prostate (HCC) 10/22/2019   Myopia with astigmatism and presbyopia, bilateral 06/15/2018   Last Assessment & Plan:  Formatting of this note  might be different from the original. - Wrx printed Formatting of this note might be different from the original. 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 - Stable, monitor Formatting of this note might be different from the original. Last Assessment & Plan:  - Stable, monitor   Retroperitoneal abscess (HCC) 10/02/2015   Status post corneal transplant 10/07/2016   Formatting of this note might be different from the original. Overview:  Added automatically from request for surgery [id number] Formatting of this note might be different from the original. Added automatically from request for surgery [id number]  Last Assessment & Plan:  Formatting of this note might be different from the original. - OD, 2/2 KCN - Stable, no NVK / rejection - Continue off steroids - Mo   Thoracic ascending aortic aneurysm (HCC)    TIA (transient ischemic attack) 2012   Type 2 diabetes mellitus (HCC)     Home Medications Prior to Admission medications   Medication Sig Start Date End Date Taking? Authorizing Provider  acetaminophen (TYLENOL) 500 MG tablet Take 2 tablets (1,000 mg total) by mouth every 8 (eight) hours. 04/18/23   Joseph Art, DO  albuterol (PROVENTIL) (2.5 MG/3ML) 0.083% nebulizer solution Take 2.5 mg by nebulization every 4 (four) hours  as needed for wheezing or shortness of breath.    [provider]  albuterol (VENTOLIN HFA) 108 (90 Base) MCG/ACT inhaler Inhale 2 puffs into the lungs every 4 (four) hours as needed for wheezing or shortness of breath.    [provider]  Amino Acids-Protein Hydrolys (FEEDING SUPPLEMENT, PRO-STAT SUGAR FREE 64,) LIQD Take 30 mLs by  mouth daily.    [provider]  apixaban (ELIQUIS) 5 MG TABS tablet Take 1 tablet (5 mg total) by mouth 2 (two) times daily. 08/16/22 04/02/23  Levin Erp, MD  ascorbic acid (VITAMIN C) 500 MG tablet Take 1 tablet (500 mg total) by mouth 2 (two) times daily for 21 days. 04/18/23 05/09/23  Joseph Art, DO  atorvastatin (LIPITOR) 80 MG tablet Take 0.5 tablets (40 mg total) by mouth every evening. 04/18/23   Marlin Canary U, DO  B Complex-C (B-COMPLEX WITH VITAMIN C) tablet Take 1 tablet by mouth daily. 10/24/22   Lanae Boast, MD  bisacodyl (DULCOLAX) 10 MG suppository Place 1 suppository (10 mg total) rectally daily as needed for moderate constipation. 10/24/22   Lanae Boast, MD  daptomycin (CUBICIN) IVPB Inject 800 mg into the vein daily. Indication:  Fournier's gangrene/osteo  First Dose: Yes Last Day of Therapy:  05/20/23- Please pull PICC at completion of IV antibiotics  Labs - Once weekly:  CBC/D, CMP, and CPK Labs - Once weekly: ESR and CRP Method of administration: IV Push Method of administration may be changed at the discretion of home infusion pharmacist based upon assessment of the patient and/or caregiver's ability to self-administer the medication ordered. 04/18/23 05/28/23  Joseph Art, DO  diclofenac Sodium (VOLTAREN) 1 % GEL Apply 1 g topically daily. Apply to bilateral knees 07/20/21   [provider]  docusate sodium (COLACE) 100 MG capsule Take 1 capsule (100 mg total) by mouth 2 (two) times daily. 04/18/23   Joseph Art, DO  ertapenem (INVANZ) IVPB Inject 1 g into the vein daily. Indication:  Fournier's gangrene/osteo  First Dose: Yes Last Day of Therapy:  05/20/23- Please pull PICC at completion of IV antibiotics  Labs - Once weekly:  CBC/D and CMP, Labs - Once weekly: ESR and CRP Method of administration: Mini-Bag Plus / Gravity Method of administration may be changed at the discretion of home infusion pharmacist based upon assessment of the patient and/or  caregiver's ability to self-administer the medication ordered. 04/18/23 05/28/23  Joseph Art, DO  feeding supplement (ENSURE ENLIVE / ENSURE PLUS) LIQD Take 237 mLs by mouth 3 (three) times daily between meals. Patient not taking: Reported on 04/02/2023 10/24/22   Lanae Boast, MD  ferrous sulfate 325 (65 FE) MG tablet Take 1 tablet (325 mg total) by mouth daily with breakfast. 10/25/22   Lanae Boast, MD  finasteride (PROSCAR) 5 MG tablet Take 5 mg by mouth daily.    [provider]  gabapentin (NEURONTIN) 300 MG capsule Take 1 capsule (300 mg total) by mouth at bedtime. 04/18/23   Joseph Art, DO  hydrocortisone (CORTEF) 5 MG tablet Take 1 tablet (5 mg total) by mouth 2 (two) times daily. 04/18/23   Joseph Art, DO  insulin aspart (NOVOLOG) 100 UNIT/ML injection Inject 0-15 Units into the skin 3 (three) times daily with meals. 04/18/23   Joseph Art, DO  insulin glargine-yfgn (SEMGLEE) 100 UNIT/ML injection Inject 0.2 mLs (20 Units total) into the skin at bedtime. 04/18/23   Marlin Canary U, DO  melatonin 5 MG  TABS Take 1 tablet (5 mg total) by mouth daily at 6 PM. Patient taking differently: Take 5 mg by mouth every evening. 10/24/22   Lanae Boast, MD  midodrine (PROAMATINE) 10 MG tablet Take 1 tablet (10 mg total) by mouth with breakfast, with lunch, and with evening meal. 04/18/23   Marlin Canary U, DO  oxyCODONE (OXY IR/ROXICODONE) 5 MG immediate release tablet Take 1 tablet (5 mg total) by mouth every 4 (four) hours as needed for moderate pain, severe pain or breakthrough pain. 04/18/23   Joseph Art, DO  oxyCODONE (OXYCONTIN) 15 mg 12 hr tablet Take 1 tablet (15 mg total) by mouth every 12 (twelve) hours. 04/18/23   Joseph Art, DO  OXYGEN Inhale 2 L into the lungs continuous. To maintain stats above 90%  PRN order: use as needed for shortness of breath while laying flat on exertion    [provider]  pantoprazole (PROTONIX) 40 MG tablet Take 1 tablet (40 mg total) by  mouth daily. 06/30/22   Pokhrel, Rebekah Chesterfield, MD  saccharomyces boulardii (FLORASTOR) 250 MG capsule Take 250 mg by mouth 2 (two) times daily. Patient not taking: Reported on 04/02/2023    [provider]  senna-docusate (SENOKOT-S) 8.6-50 MG tablet Take 1 tablet by mouth 2 (two) times daily. Patient not taking: Reported on 04/02/2023 10/24/22   Lanae Boast, MD  tamsulosin (FLOMAX) 0.4 MG CAPS capsule Take 0.4 mg by mouth daily. 07/29/22   [provider]  zinc sulfate 220 (50 Zn) MG capsule Take 1 capsule (220 mg total) by mouth daily. 04/18/23   Joseph Art, DO      Allergies    Sglt2 inhibitors    Review of Systems   Review of Systems  Constitutional:  Negative for fever.  Gastrointestinal:  Negative for vomiting.  Neurological:  Negative for headaches.    Physical Exam Updated Vital Signs BP 102/67   Pulse 68   Temp 98.2 F (36.8 C) (Rectal)   Resp 12   Ht 1.88 m (6\' 2" )   Wt 110 kg   SpO2 100%   BMI 31.14 kg/m  Physical Exam CONSTITUTIONAL: Chronically ill-appearing HEAD: Normocephalic/atraumatic EYES: EOMI/PERRL ENMT: Mucous membranes moist NECK: supple no meningeal signs SPINE/BACK:entire spine nontender CV: S1/S2 noted, no murmurs/rubs/gallops noted LUNGS: Lungs are clear to auscultation bilaterally, no apparent distress ABDOMEN: soft, nontender, no rebound or guarding, bowel sounds noted throughout abdomen GU Foley in place.  Scrotum is erythematous and mildly tender.  No crepitus or drainage. NEURO: Pt is awake/alert/appropriate EXTREMITIES: Left BKA noted.  Right foot has wound, see photo below SKIN: warm, color normal Bandage noted to sacrum.  No other significant skin breakdown PSYCH: no abnormalities of mood noted, alert and oriented to situation     ED Results / Procedures / Treatments   Labs (all labs ordered are listed, but only abnormal results are displayed) Labs Reviewed  COMPREHENSIVE METABOLIC PANEL - Abnormal; Notable for the  following components:      Result Value   Sodium 133 (*)    CO2 19 (*)    Glucose, Bld 184 (*)    Calcium 8.3 (*)    Total Protein 6.1 (*)    Albumin 2.4 (*)    All other components within normal limits  CBC - Abnormal; Notable for the following components:   RBC 3.78 (*)    Hemoglobin 9.1 (*)    HCT 30.7 (*)    MCH 24.1 (*)    MCHC 29.6 (*)  RDW 22.3 (*)    All other components within normal limits  LACTIC ACID, PLASMA - Abnormal; Notable for the following components:   Lactic Acid, Venous 2.9 (*)    All other components within normal limits  URINALYSIS, ROUTINE W REFLEX MICROSCOPIC - Abnormal; Notable for the following components:   Color, Urine RED (*)    APPearance CLOUDY (*)    Hgb urine dipstick LARGE (*)    Protein, ur 100 (*)    Leukocytes,Ua MODERATE (*)    All other components within normal limits  LACTIC ACID, PLASMA  URINALYSIS, MICROSCOPIC (REFLEX)  CK  MAGNESIUM    EKG EKG Interpretation Date/Time:  Tuesday May 06 2023 00:46:04 EDT Ventricular Rate:  101 PR Interval:    QRS Duration:  141 QT Interval:  419 QTC Calculation: 544 R Axis:   229  Text Interpretation: Junctional tachycardia Right bundle branch block Confirmed by Zadie Rhine ([id number]) on 05/06/2023 1:27:27 AM  Radiology CT Head Wo Contrast  Result Date: 05/06/2023 CLINICAL DATA:  Delirium EXAM: CT HEAD WITHOUT CONTRAST TECHNIQUE: Contiguous axial images were obtained from the base of the skull through the vertex without intravenous contrast. RADIATION DOSE REDUCTION: This exam was performed according to the departmental dose-optimization program which includes automated exposure control, adjustment of the mA and/or kV according to patient size and/or use of iterative reconstruction technique. COMPARISON:  04/19/2023 FINDINGS: Brain: No evidence of acute infarction, hemorrhage, hydrocephalus, extra-axial collection or mass lesion/mass effect. Small chronic cortex infarcts seen along the  right cerebral convexity. Generalized brain atrophy. Vascular: No hyperdense vessel or unexpected calcification. Skull: Normal. Negative for fracture or focal lesion. Sinuses/Orbits: No acute finding. IMPRESSION: 1. No acute finding. 2. Small chronic infarcts scattered in the right cerebral cortex. Electronically Signed   By: Tiburcio Pea M.D.   On: 05/06/2023 04:06   DG Chest Portable 1 View  Result Date: 05/06/2023 CLINICAL DATA:  Weakness EXAM: PORTABLE CHEST 1 VIEW COMPARISON:  04/03/2023 FINDINGS: Pulmonary insufflation is stable. Diffusely increased opacity of the right hemithorax likely relates to posteriorly layering small to moderate pleural fluid on this supine examination. Moderate left pleural effusion also noted. Superimposed left basilar atelectasis or infiltrate is present. No definite pneumothorax. Right upper extremity PICC line tip seen at the superior cavoatrial junction. Cardiac size within normal limits. Pulmonary vascularity is normal. No acute bone abnormality. IMPRESSION: 1. Small to moderate bilateral pleural effusions. 2. Left basilar atelectasis or infiltrate. Electronically Signed   By: Helyn Numbers M.D.   On: 05/06/2023 03:26    Procedures Procedures    Medications Ordered in ED Medications  LORazepam (ATIVAN) injection 2 mg (has no administration in time range)  fentaNYL (SUBLIMAZE) injection 50 mcg (50 mcg Intravenous Given 05/06/23 0231)  sodium chloride 0.9 % bolus 1,000 mL (0 mLs Intravenous Stopped 05/06/23 0419)    ED Course/ Medical Decision Making/ A&P Clinical Course as of 05/06/23 0525  Tue May 06, 2023  0243 Patient presented for altered mental status of unclear etiology, now improved. Patient did had erythema to his scrotum as he had a recent history of Fournier's gangrene.  There was no crepitus or drainage.  Photo posted of the chart and this was reviewed with Dr. Lafonda Mosses urology.  He has reviewed the records as well as the photo.  He feels this is  less likely an acute issue.  He will help arrange follow-up as an outpatient the patient is not admitted otherwise [DW]  0257 Discussed with nursing home.  They  report patient went unresponsive and appeared to have seizure activity.  Afterwards he was very confused and took a while for him to wake up.  His glucose was above 200 at that time.  This has not happened before. [DW]  4098 Patient with very complicated history.  He is already receiving IV antibiotics via right arm PICC line at the nursing facility.  This is supposed to continue through July. However due to episode of altered mental status and possible seizure, will obtain CT head Patient would likely be admitted [DW]  0500 D/w dr bhagat with neuro Recommends labs Admit to hospitalist Will likely need EEG/MRI Re-consult neuro if imaging abnormal  [DW]  0524 Discussed with Dr. Margo Aye for admission [DW]    Clinical Course User Index [DW] Zadie Rhine, MD                             Medical Decision Making Amount and/or Complexity of Data Reviewed Labs: ordered. Radiology: ordered.  Risk Prescription drug management. Decision regarding hospitalization.   This patient presents to the ED for concern of weakness and altered mental status, this involves an extensive number of treatment options, and is a complaint that carries with it a high risk of complications and morbidity.  The differential diagnosis includes but is not limited to CVA, intracranial hemorrhage, acute coronary syndrome, renal failure, urinary tract infection, electrolyte disturbance, pneumonia   Comorbidities that complicate the patient evaluation: Patient's presentation is complicated by their history of diabetes, CAD, Fournier's gangrene  Social Determinants of Health: Patient's  nursing home status   increases the complexity of managing their presentation  Additional history obtained: Additional history obtained from nursing home/care facility Records  reviewed previous admission documents  Lab Tests: I Ordered, and personally interpreted labs.  The pertinent results include: Elevated lactic acid, anemia  Imaging Studies ordered: I ordered imaging studies including CT scan head and X-ray chest   I independently visualized and interpreted imaging which showed no acute findings I agree with the radiologist interpretation   Medicines ordered and prescription drug management: I ordered medication including fentanyl for pain Reevaluation of the patient after these medicines showed that the patient    improved  Consultations Obtained: I requested consultation with the admitting physician triad and consultant urology, neurology , and discussed  findings as well as pertinent plan - they recommend: will admit for seizure workup  Reevaluation: After the interventions noted above, I reevaluated the patient and found that they have :stayed the same  Complexity of problems addressed: Patient's presentation is most consistent with  acute presentation with potential threat to life or bodily function  Disposition: After consideration of the diagnostic results and the patient's response to treatment,  I feel that the patent would benefit from admission   .           Final Clinical Impression(s) / ED Diagnoses Final diagnoses:  Seizure-like activity [facility])    Rx / DC Orders ED Discharge Orders     None         Zadie Rhine, MD 05/06/23 [phone]

## 2023-05-06 NOTE — ED Triage Notes (Signed)
Pt presents via EMS from [facility] c/o "unresponsive" EPISODE AT snf. EMS reports pt was unresponsive upon arrival to SNF. Reports had episode pf "shaking" per EMS. Pt on PO abx for recent right foot partial amputation.

## 2023-05-06 NOTE — H&P (Signed)
History and Physical    Patient: Peter Yang ZOX:[id number] DOB: [birth year] DOA: 05/06/2023 DOS: the patient was seen and examined on 05/06/2023 PCP: Georgann Housekeeper, MD  Patient coming from: SNF  Chief Complaint:  Chief Complaint  Patient presents with   Altered Mental Status   HPI: Peter Yang is a 67 y.o. male with medical history significant for but not limited to CAD, diabetic polyneuropathy with diabetes mellitus type 2, history of NSTEMI, history of stent placement, hyperlipidemia, BPH, history of large bowel perforation, history of PAF, ischemic cardiomyopathy, history of TIA and stroke, history of diabetic foot ulcer, history of BKA on the left as well as other comorbidities including systolic and diastolic combined CHF and LVEF of 35% and grade 2 diastolic dysfunction who was recently hospitalized on 04/01/2023 until 04/18/2023 from his skilled nursing facility due to positive blood from his Foley catheter.  He had acute urinary retention and had a Foley catheter placed at that time on June 8.  Subsequently he had a CT scan done that showed findings consistent with Fournier's gangrene and was seen by urology.  He is status post I&D on 04/02/2023 1 return to the OR on 04/09/2023 for wound closure.  Given his hospitalization he ended up in heart failure and volume overload and so cardiology was consulted.  Infectious diseases also evaluated the patient and recommended 6 weeks of antibiotics with daptomycin and ertapenem.  He was discharged on on the 28th.  Subsequently returned on 05/06/2023 for a unresponsive episode and episode of shaking per EMS.  Subsequently patient awoke and did not recall the episode and denied any fevers or vomiting.  He had no chest pain shortness of breath or headaches.  The EDP discussed with neurology who recommended continuing antibiotics, checking mag level as well as obtain a brain MRI with and without contrast as well as routine EEG.  Patient was admitted by the  hospitalist team for his unresponsive episode and shaking and will undergo further workup.   Review of Systems: As mentioned in the history of present illness. All other systems reviewed and are negative. 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 Overview:  Added automatically from request for surgery [id number] Formatting of this note might be different from the original. Added automatically from request for surgery [id number]   Large bowel perforation (HCC) 09/27/2015   Malignant neoplasm of prostate (HCC) 10/22/2019   Myopia with astigmatism and presbyopia, bilateral 06/15/2018   Last Assessment & Plan:  Formatting of this note might be different from the original. - Wrx printed Formatting of this note might be different from the original. 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 - Stable, monitor Formatting of this note might be different from the  original. Last Assessment & Plan:  - Stable, monitor   Retroperitoneal abscess (HCC) 10/02/2015   Status post corneal transplant 10/07/2016   Formatting of this note might be different from the original. Overview:  Added automatically from request for surgery [id number] Formatting of this note might be different from the original. Added automatically from request for surgery [id number]  Last Assessment & Plan:  Formatting of this note might be different from the original. - OD, 2/2 KCN - Stable, no NVK / rejection - Continue off steroids - Mo   Thoracic ascending aortic aneurysm (HCC)    TIA (transient ischemic attack) 2012   Type 2 diabetes mellitus [facility])    Past Surgical History:  Procedure Laterality Date   BELOW KNEE LEG AMPUTATION Left 2022   CORONARY ANGIOPLASTY WITH STENT PLACEMENT  01/2022   DES RCA   IRRIGATION AND DEBRIDEMENT ABSCESS N/A 04/02/2023   Procedure: IRRIGATION AND DEBRIDEMENT ABSCESS;  Surgeon: Crist Fat, MD;  Location: [facility] OR;  Service: Urology;  Laterality: N/A;   IRRIGATION AND DEBRIDEMENT ABSCESS N/A 04/09/2023   Procedure: SCROTAL WASHOUT AND DEBRIDEMENT WITH WOUND CLOSURE;  Surgeon: Crist Fat, MD;  Location: [facility] OR;  Service: Urology;  Laterality: N/A;  60 MINUTES   LEFT HEART CATH AND CORONARY ANGIOGRAPHY N/A 06/17/2022   Procedure: LEFT HEART CATH AND CORONARY ANGIOGRAPHY;  Surgeon: Yvonne Kendall, MD;  Location: [facility] INVASIVE CV LAB;  Service: Cardiovascular;  Laterality: N/A;   ROTATOR CUFF REPAIR Left    Social History:  reports that he has never smoked. He has never used smokeless tobacco. He reports that he does not drink alcohol and does not use drugs.  Allergies  Allergen Reactions   Sglt2 Inhibitors     Fournier's gangrene    Family History  Problem Relation Age of Onset   Diabetes Mother    Hypertension Mother    Heart disease Mother    Cancer Father    Heart attack Sister    Cancer Sister    Diabetes Brother     Prior to  Admission medications   Medication Sig Start Date End Date Taking? Authorizing Provider  acetaminophen (TYLENOL) 500 MG tablet Take 2 tablets (1,000 mg total) by mouth every 8 (eight) hours. 04/18/23   Joseph Art, DO  albuterol (PROVENTIL) (2.5 MG/3ML) 0.083% nebulizer solution Take 2.5 mg by nebulization every 4 (four) hours as needed for wheezing or shortness of breath.    [provider]  albuterol (VENTOLIN HFA) 108 (90 Base) MCG/ACT inhaler Inhale 2 puffs into the lungs every 4 (four) hours as needed for wheezing or shortness of breath.    [provider]  Amino Acids-Protein Hydrolys (FEEDING SUPPLEMENT, PRO-STAT SUGAR FREE 64,) LIQD Take 30 mLs by mouth daily.    [provider]  apixaban (ELIQUIS) 5 MG TABS tablet Take 1 tablet (5 mg total) by mouth 2 (two) times daily. 08/16/22 04/02/23  Levin Erp, MD  ascorbic acid (VITAMIN C) 500 MG tablet Take 1 tablet (500 mg total) by mouth 2 (two) times daily for 21 days. 04/18/23 05/09/23  Joseph Art, DO  atorvastatin (LIPITOR) 80 MG tablet Take 0.5 tablets (40 mg total) by mouth every evening. 04/18/23  Marlin Canary U, DO  B Complex-C (B-COMPLEX WITH VITAMIN C) tablet Take 1 tablet by mouth daily. 10/24/22   Lanae Boast, MD  bisacodyl (DULCOLAX) 10 MG suppository Place 1 suppository (10 mg total) rectally daily as needed for moderate constipation. 10/24/22   Lanae Boast, MD  daptomycin (CUBICIN) IVPB Inject 800 mg into the vein daily. Indication:  Fournier's gangrene/osteo  First Dose: Yes Last Day of Therapy:  05/20/23- Please pull PICC at completion of IV antibiotics  Labs - Once weekly:  CBC/D, CMP, and CPK Labs - Once weekly: ESR and CRP Method of administration: IV Push Method of administration may be changed at the discretion of home infusion pharmacist based upon assessment of the patient and/or caregiver's ability to self-administer the medication ordered. 04/18/23 05/28/23  Joseph Art, DO  diclofenac  Sodium (VOLTAREN) 1 % GEL Apply 1 g topically daily. Apply to bilateral knees 07/20/21   [provider]  docusate sodium (COLACE) 100 MG capsule Take 1 capsule (100 mg total) by mouth 2 (two) times daily. 04/18/23   Joseph Art, DO  ertapenem (INVANZ) IVPB Inject 1 g into the vein daily. Indication:  Fournier's gangrene/osteo  First Dose: Yes Last Day of Therapy:  05/20/23- Please pull PICC at completion of IV antibiotics  Labs - Once weekly:  CBC/D and CMP, Labs - Once weekly: ESR and CRP Method of administration: Mini-Bag Plus / Gravity Method of administration may be changed at the discretion of home infusion pharmacist based upon assessment of the patient and/or caregiver's ability to self-administer the medication ordered. 04/18/23 05/28/23  Joseph Art, DO  feeding supplement (ENSURE ENLIVE / ENSURE PLUS) LIQD Take 237 mLs by mouth 3 (three) times daily between meals. Patient not taking: Reported on 04/02/2023 10/24/22   Lanae Boast, MD  ferrous sulfate 325 (65 FE) MG tablet Take 1 tablet (325 mg total) by mouth daily with breakfast. 10/25/22   Lanae Boast, MD  finasteride (PROSCAR) 5 MG tablet Take 5 mg by mouth daily.    [provider]  gabapentin (NEURONTIN) 300 MG capsule Take 1 capsule (300 mg total) by mouth at bedtime. 04/18/23   Joseph Art, DO  hydrocortisone (CORTEF) 5 MG tablet Take 1 tablet (5 mg total) by mouth 2 (two) times daily. 04/18/23   Joseph Art, DO  insulin aspart (NOVOLOG) 100 UNIT/ML injection Inject 0-15 Units into the skin 3 (three) times daily with meals. 04/18/23   Joseph Art, DO  insulin glargine-yfgn (SEMGLEE) 100 UNIT/ML injection Inject 0.2 mLs (20 Units total) into the skin at bedtime. 04/18/23   Marlin Canary U, DO  melatonin 5 MG TABS Take 1 tablet (5 mg total) by mouth daily at 6 PM. Patient taking differently: Take 5 mg by mouth every evening. 10/24/22   Lanae Boast, MD  midodrine (PROAMATINE) 10 MG tablet Take 1 tablet (10 mg total)  by mouth with breakfast, with lunch, and with evening meal. 04/18/23   Marlin Canary U, DO  oxyCODONE (OXY IR/ROXICODONE) 5 MG immediate release tablet Take 1 tablet (5 mg total) by mouth every 4 (four) hours as needed for moderate pain, severe pain or breakthrough pain. 04/18/23   Joseph Art, DO  oxyCODONE (OXYCONTIN) 15 mg 12 hr tablet Take 1 tablet (15 mg total) by mouth every 12 (twelve) hours. 04/18/23   Joseph Art, DO  OXYGEN Inhale 2 L into the lungs continuous. To maintain stats above 90%  PRN order: use as needed for  shortness of breath while laying flat on exertion    [provider]  pantoprazole (PROTONIX) 40 MG tablet Take 1 tablet (40 mg total) by mouth daily. 06/30/22   Pokhrel, Rebekah Chesterfield, MD  saccharomyces boulardii (FLORASTOR) 250 MG capsule Take 250 mg by mouth 2 (two) times daily. Patient not taking: Reported on 04/02/2023    [provider]  senna-docusate (SENOKOT-S) 8.6-50 MG tablet Take 1 tablet by mouth 2 (two) times daily. Patient not taking: Reported on 04/02/2023 10/24/22   Lanae Boast, MD  tamsulosin (FLOMAX) 0.4 MG CAPS capsule Take 0.4 mg by mouth daily. 07/29/22   [provider]  zinc sulfate 220 (50 Zn) MG capsule Take 1 capsule (220 mg total) by mouth daily. 04/18/23   Joseph Art, DO    Physical Exam: Vitals:   05/06/23 0500 05/06/23 0539 05/06/23 0600 05/06/23 0630  BP: 102/67  107/72 99/71  Pulse: 68  71 71  Resp: 12  12 12   Temp:  97.8 F (36.6 C)    TempSrc:  Oral    SpO2: 100%  100% 100%  Weight:      Height:       Examination: Physical Exam:  Constitutional: Obese chronically ill-appearing Caucasian male who appears calm Respiratory: Diminished to auscultation bilaterally, no wheezing, rales, rhonchi or crackles. Normal respiratory effort and patient is not tachypenic. No accessory muscle use.  Unlabored breathing Cardiovascular: RRR, no murmurs / rubs / gallops. S1 and S2 auscultated.  Has some lower extremity edema  on the right Abdomen: Soft, non-tender, distended secondary to body habitus.  Bowel sounds present GU: Deferred.  Scrotal changes noted and he has sutures in place as well as a Penrose drain in the back of his scrotum.  Has a catheter in place as well Musculoskeletal: No clubbing / cyanosis of digits/nails has a left BKA and a foot wound noted on the right Skin: Has a large foot wound noted.  No appreciable rashes or lesions on limited skin evaluation Neurologic: CN 2-12 grossly intact with no focal deficits. Romberg sign and cerebellar reflexes not assessed.  Psychiatric: Normal judgment and insight. Alert and oriented x 3.  He is a little withdrawn  Data Reviewed:   Recent Labs  Lab 05/06/23 0058  NA 133*  K 4.1  CO2 19*  BUN 20  GLUCOSE 184*  MG 1.7  ALBUMIN 2.4*  ALKPHOS 94  AST 26  ALT 16  BILITOT 0.9   Recent Labs  Lab 05/06/23 0058  WBC 9.2  HGB 9.1*  HCT 30.7*  MCV 81.2  PLT 203   Recent Labs  Lab 04/11/23 0829 05/06/23 0100 05/06/23 0351  LATICACIDVEN 1.6 2.9* 1.4   Assessment and Plan: No notes have been filed under this hospital service. Service: Hospitalist  Unresponsive episode with shaking, concern for possible seizure -Neurology was curb sided by the ED and they recommended obtaining an MRI and an EEG -Head CT done on admission -Checked a urinalysis and showed a cloudy appearance with red color, large hemoglobin, moderate leukocytes, negative nitrites, greater than 50 RBCs per high-power field and greater than 50 WBCs -Head CT done and showed "No acute finding. Small chronic infarcts scattered in the right cerebral cortex." -MRI done and showed "No acute intracranial abnormality or mass. Unchanged encephalomalacia from prior infarct along the right temporoparietal junction and right corona radiata." -EEG done and showed "This study is suggestive of mild to moderate diffuse encephalopathy, nonspecific etiology. 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   -No evidence of osteo.  Diffuse myositis throughout pelvic and thigh musculature.  Underwent wound washout and closure with exchange of the Foley catheter by Dr. Marlou Porch 04/09/2023.  -No further surgical plans by urology.  PICC line placed 04/10/2023 for long-term biotic management -ID discontinued Merrem and linezolid and started on ertapenem and daptomycin on 04/10/2023 with the plan to continue both antibiotics for 6 weeks with EOT on 05/20/2023. Stop SGLT2 at discharge, added to "allergies/contraindications" list -Patient still has sutures in a Penrose drain so have asked the urology team to evaluate   Long term current use of systemic steroids -Unclear why he was on cortef on admission.  Was on 10 mg Solu-Cortef in the morning and 5 at night. -Appears to have started in October/November 2023.suspect these were started due to his chronic hypotension. was started to wean 04/09/2023 and was stopped 6/20; however, given how long he has been on this medication will continue at least 5 mg BID and then taper.  -Resume his home Solu-Cortef 5 mg p.o. twice daily -taper over weeks  Currently on midodrine 10 mg 3 times daily Continue to closely monitor vital signs.    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 Continue local wound care.   S/P BKA (below knee amputation) unilateral, left (HCC)   Chronic combined diastolic and systolic CHF with LVEF 35%, grade 2 diastolic dysfunction. Hypoalbuminemia  Anasarca  -Echo done on 04/07/2023 with EF 35%, grade II diastolic dysfunction, mildly reduce RVSF, RV size moderately enlarged, severe MV regurg. -Diuresis had to be stopped last admission due to hypotension -Will use judicious use of fluid hydration with normal saline boluses  Intake/Output Summary (Last 24 hours) at 05/06/2023 2039 Last data filed at 05/06/2023 1303 Gross per 24 hour  Intake --  Output 652 ml  Net -652 ml  -Strict I's and O's and daily weights -Continue to monitor for signs and symptoms of volume overload  Hypomagnesemia -Patient's Mag Level Trend: Recent Labs  Lab 04/07/23 0135 04/09/23 0053 04/12/23 0929 04/13/23 1100 04/14/23 0620 05/06/23 0058 05/06/23 0830  MG 2.1 1.8 1.7 1.6* 2.2 1.7 1.6*  -Replete with IV Mag Sulfate 2 grams -Continue to Monitor and Replete as Necessary -Repeat Mag in the AM   Hypotension -Continue to hold off any blood pressure medications and will consider increasing Solu-Cortef dose  Type 2 diabetes mellitus with diabetic peripheral angiopathy without gangrene, without long-term current use of insulin (HCC) -Continue with SSI and 20 units of Semglee nightly -Add moderate NovoLog sliding scale insulin AC and at bedtime -Glucose and CBG Trend: Recent Labs  Lab 04/12/23 0929 04/12/23 1202 04/13/23 1100 04/13/23 1304 04/14/23 0620 04/14/23 0944 04/15/23 0700 04/15/23 1133 04/17/23 0517 04/17/23 0620 05/06/23 0058 05/06/23 0830 05/06/23 1652  GLUCOSE 118*  --  210*  --  174*  --  150*  --  120*  --  184* 99  --   GLUCAP  --    < >  --    < >  --    < >  --    < >  --    < >  --   --  157*   < > = values in this interval not displayed.   History of urinary retention -Foley  catheter placed on Saturday 03-29-2023 during his last hospitalization he was initiated on Flomax and underwent Foley exchange 04/09/2023. -Exchange Foley catheter while hospitalized now   PAF (paroxysmal atrial fibrillation) (HCC) on Eliquis -Continue with anticoagulation and currently not on any medications -Continue to monitor in the progressive care unit   Essential hypertension now extremely hypotensive -Has hx of orthostatic hypotension. -Vital signs are currently showing blood pressures on the low side, on midodrine 10 mg 3 times daily -Given 2.25 L boluses and continue monitor for signs symptoms of overload -Admitted to the PCU given his tenuous blood pressures -Continue to monitor vital signs   Prostate Cancer -He now has a Foley catheter in place and will change this -Discussed with the urology resident to have urology see the patient in the morning   Hyponatremia -Na+ Trend: Recent Labs  Lab 04/11/23 0155 04/12/23 0929 04/13/23 1100 04/14/23 0620 04/15/23 0700 04/17/23 0517 05/06/23 0058  NA 132* 132* 131* 130* 133* 132* 133*  -Stable. Continue to Monitor and Trend and Repeat CMP in the AM  Metabolic Acidosis Lactic Acidosis -Patient has a CO2 of 19, Chloride Level of 104, AG of 10 -Lactic Acid Trend:  Recent Labs  Lab 04/11/23 0829 05/06/23 0100 05/06/23 0351  LATICACIDVEN 1.6 2.9* 1.4  -Received 1 Liter of NS in the ED but will use cautious and judicious use of IVF given Hx of CHF  Normocytic Anemia with recent acute blood loss anemia on last hospitalization -Hgb/Hct Trend: Recent Labs  Lab 04/12/23 0929 04/13/23 1100 04/14/23 0620 04/15/23 0700 04/17/23 0517 04/18/23 0612 05/06/23 0058  HGB 9.7* 9.4* 8.9* 8.7* 8.0* 8.3* 9.1*  HCT 31.3* 30.3* 28.7* 28.6* 26.0* 26.5* 30.7*  MCV 78.3* 77.3* 76.9* 79.7* 78.5* 79.8* 81.2  -Check Anemia Panel in the AM -Continue to Monitor for S/Sx of Bleeding; no overt bleeding noted -Repeat CBC in the AM    Hypoalbuminemia -Patient's Albumin Trend: Recent Labs  Lab 04/07/23 0135 04/09/23 0053 04/13/23 1100 04/15/23 0700 05/06/23 0058  ALBUMIN 2.4* 2.2* 2.0* 2.0* 2.4*  -Continue to Monitor and Trend and repeat CMP in the AM  Obesity -Complicates overall prognosis and care -Estimated body mass index is 31.14 kg/m as calculated from the following:   Height as of this encounter: 6\' 2"  (1.88 m).   Weight as of this encounter: 110 kg.  -Weight Loss and Dietary Counseling given   Goals of care -Currently DNR -Palliative care was following at the SNF and had a palliative care consultation on his last admission-- poor overall prognosis -May benefit from another palliative goals of care discussion  Advance Care Planning:   Code Status: Prior DNR/DNI  Consults: Neuro (Curbside), Asked Urology to see in the AM  Family Communication: None  Severity of Illness: The appropriate patient status for this patient is OBSERVATION. Observation status is judged to be reasonable and necessary in order to provide the required intensity of service to ensure the patient's safety. [name] presenting symptoms, physical exam findings, and initial radiographic and laboratory data in the context of their medical condition is felt  to place them at decreased risk for further clinical deterioration. Furthermore, it is anticipated that the patient will be medically stable for discharge from the hospital within 2 midnights of admission.   Author: Merlene Laughter, DO 05/06/2023 8:09 AM  For on call review [email or web address].

## 2023-05-06 NOTE — Plan of Care (Signed)
  These are curbside recommendations based upon the information readily available in the chart on brief review as well as history and examination information provided to me by requesting provider and do not replace a full detailed consult  This is a 67 year old gentleman with significant medical comorbidities including DNR/DNI CODE STATUS, recently discharged on 04/18/2023 with Fourneir's gangrene (on ertapenem and daptomycin), left BKA, heart failure with reduced EF (35%, grade 2 diastolic dysfunction), paroxysmal A-fib on Eliquis, right foot diabetic ulcer s/p fifth ray amputation, hypertension, diabetes, hypoalbuminemia, anasarca, hypotension on midodrine 10 mg 3 times daily  He was witnessed to be unresponsive at his nursing home with shaking activity that self resolved without any focal deficits noted, and was confused/likely postictal afterwards with gradual recovery to baseline.  He is now at his baseline mental status with no recollection of the event per EDP.  No clear prior history of seizures.  However on chart review 07/28/2021 he was found down in his apartment with associated confusion and found to have a right sylvian fissure subarachnoid hemorrhage and was briefly on Keppra for seizure prophylaxis for standard 7-day course.  Recommendations: -Continue antibiotics.   -Check magnesium and replete if needed.   -Check CK and trend to peak if elevated -MRI brain with and without contrast and routine EEG are reasonable workup for this patient.   If these are reassuring and he has no subsequent events, outpatient follow-up is reasonable.   If there are concerning findings or patient has further events, please reach out to neurology for full consultation.   -Given his baseline functional status he is likely meeting criteria for seizure precautions, however formally these should be included in his discharge instructions and reviewed with him/his facility.     Brooke Dare MD-PhD [facility] [phone]  Standard seizure precautions: Per [facility] statutes, patients with seizures are not allowed to drive until  they have been seizure-free for six months. Use caution when using heavy equipment or power tools. Avoid working on ladders or at heights. Take showers instead of baths. Ensure the water temperature is not too high on the home water heater. Do not go swimming alone. When caring for infants or small children, sit down when holding, feeding, or changing them to minimize risk of injury to the child in the event you have a seizure.  To reduce risk of seizures, maintain good sleep hygiene avoid alcohol and illicit drug use, take all anti-seizure medications as prescribed.

## 2023-05-06 NOTE — ED Notes (Signed)
Patient transported to MRI 

## 2023-05-06 NOTE — ED Notes (Signed)
Critical lactic acid of 2.9 reported to Dr Bebe Shaggy.

## 2023-05-06 NOTE — Consult Note (Signed)
WOC Nurse Consult Note: Reason for Consult:Wound to right foot and knee, also sacral stage 3 Pressure Injury. Known to our service from last admission. Wound type: surgical, trauma and pressure Pressure Injury POA: Yes Measurement:Bedside RN to measure and document measurements of wounds on nursing flow sheet with next application of dressings Wound bed:see photo of foot wound uploaded to EMR by Admitting provider, red, moist. Drainage (amount, consistency, odor) small serous Periwound: intact Dressing procedure/placement/frequency:I will continue the POC initiated by the podiatrist in the community and continued by my associate during the last admission that is, for daily cleansing of the wounds with pure hypochlorous acid (Vashe) and then placing a Vashe moistened gauze over the wounds topping with dry gauze and securing with silicone foam dressings.   WOC nursing team will not follow, but will remain available to this patient, the nursing and medical teams.  Please re-consult if needed.  Thank you for inviting [place] to participate in this patient's Plan of Care.  Ladona Mow, MSN, RN, CNS, GNP, Leda Min, [employer], [employer] phone:  [phone]

## 2023-05-07 DIAGNOSIS — E43 Unspecified severe protein-calorie malnutrition: Secondary | ICD-10-CM | POA: Diagnosis not present

## 2023-05-07 DIAGNOSIS — I5042 Chronic combined systolic (congestive) and diastolic (congestive) heart failure: Secondary | ICD-10-CM | POA: Diagnosis present

## 2023-05-07 DIAGNOSIS — I7121 Aneurysm of the ascending aorta, without rupture: Secondary | ICD-10-CM | POA: Diagnosis present

## 2023-05-07 DIAGNOSIS — L89153 Pressure ulcer of sacral region, stage 3: Secondary | ICD-10-CM | POA: Diagnosis not present

## 2023-05-07 DIAGNOSIS — E785 Hyperlipidemia, unspecified: Secondary | ICD-10-CM | POA: Diagnosis present

## 2023-05-07 DIAGNOSIS — E871 Hypo-osmolality and hyponatremia: Secondary | ICD-10-CM | POA: Diagnosis not present

## 2023-05-07 DIAGNOSIS — D638 Anemia in other chronic diseases classified elsewhere: Secondary | ICD-10-CM | POA: Diagnosis not present

## 2023-05-07 DIAGNOSIS — Z89512 Acquired absence of left leg below knee: Secondary | ICD-10-CM | POA: Diagnosis not present

## 2023-05-07 DIAGNOSIS — N390 Urinary tract infection, site not specified: Secondary | ICD-10-CM | POA: Diagnosis present

## 2023-05-07 DIAGNOSIS — R4182 Altered mental status, unspecified: Secondary | ICD-10-CM | POA: Diagnosis present

## 2023-05-07 DIAGNOSIS — I480 Paroxysmal atrial fibrillation: Secondary | ICD-10-CM | POA: Diagnosis present

## 2023-05-07 DIAGNOSIS — R569 Unspecified convulsions: Secondary | ICD-10-CM | POA: Diagnosis not present

## 2023-05-07 DIAGNOSIS — E8809 Other disorders of plasma-protein metabolism, not elsewhere classified: Secondary | ICD-10-CM | POA: Diagnosis present

## 2023-05-07 DIAGNOSIS — E872 Acidosis, unspecified: Secondary | ICD-10-CM | POA: Diagnosis not present

## 2023-05-07 DIAGNOSIS — Z1612 Extended spectrum beta lactamase (ESBL) resistance: Secondary | ICD-10-CM | POA: Diagnosis present

## 2023-05-07 DIAGNOSIS — E669 Obesity, unspecified: Secondary | ICD-10-CM | POA: Diagnosis present

## 2023-05-07 DIAGNOSIS — I110 Hypertensive heart disease with heart failure: Secondary | ICD-10-CM | POA: Diagnosis not present

## 2023-05-07 DIAGNOSIS — I959 Hypotension, unspecified: Secondary | ICD-10-CM | POA: Diagnosis not present

## 2023-05-07 DIAGNOSIS — Z66 Do not resuscitate: Secondary | ICD-10-CM | POA: Diagnosis not present

## 2023-05-07 DIAGNOSIS — Y846 Urinary catheterization as the cause of abnormal reaction of the patient, or of later complication, without mention of misadventure at the time of the procedure: Secondary | ICD-10-CM | POA: Diagnosis not present

## 2023-05-07 DIAGNOSIS — T8383XA Hemorrhage of genitourinary prosthetic devices, implants and grafts, initial encounter: Secondary | ICD-10-CM | POA: Diagnosis not present

## 2023-05-07 DIAGNOSIS — Z794 Long term (current) use of insulin: Secondary | ICD-10-CM | POA: Diagnosis not present

## 2023-05-07 DIAGNOSIS — G9341 Metabolic encephalopathy: Secondary | ICD-10-CM | POA: Diagnosis not present

## 2023-05-07 DIAGNOSIS — E2740 Unspecified adrenocortical insufficiency: Secondary | ICD-10-CM | POA: Diagnosis not present

## 2023-05-07 DIAGNOSIS — E1142 Type 2 diabetes mellitus with diabetic polyneuropathy: Secondary | ICD-10-CM | POA: Diagnosis not present

## 2023-05-07 DIAGNOSIS — E860 Dehydration: Secondary | ICD-10-CM | POA: Diagnosis not present

## 2023-05-07 LAB — CBC WITH DIFFERENTIAL/PLATELET
Abs Immature Granulocytes: 0 10*3/uL (ref 0.00–0.07)
Basophils Absolute: 0.1 10*3/uL (ref 0.0–0.1)
Basophils Relative: 1 %
Eosinophils Absolute: 0.5 10*3/uL (ref 0.0–0.5)
Eosinophils Relative: 5 %
HCT: 29.6 % — ABNORMAL LOW (ref 39.0–52.0)
Hemoglobin: 9.2 g/dL — ABNORMAL LOW (ref 13.0–17.0)
Lymphocytes Relative: 3 %
Lymphs Abs: 0.3 10*3/uL — ABNORMAL LOW (ref 0.7–4.0)
MCH: 25.3 pg — ABNORMAL LOW (ref 26.0–34.0)
MCHC: 31.1 g/dL (ref 30.0–36.0)
MCV: 81.3 fL (ref 80.0–100.0)
Monocytes Absolute: 0.6 10*3/uL (ref 0.1–1.0)
Monocytes Relative: 6 %
Neutro Abs: 9 10*3/uL — ABNORMAL HIGH (ref 1.7–7.7)
Neutrophils Relative %: 85 %
Platelets: 214 10*3/uL (ref 150–400)
RBC: 3.64 MIL/uL — ABNORMAL LOW (ref 4.22–5.81)
RDW: 22.6 % — ABNORMAL HIGH (ref 11.5–15.5)
WBC: 10.6 10*3/uL — ABNORMAL HIGH (ref 4.0–10.5)
nRBC: 0 % (ref 0.0–0.2)
nRBC: 0 /100 WBC

## 2023-05-07 LAB — COMPREHENSIVE METABOLIC PANEL
ALT: 16 U/L (ref 0–44)
AST: 26 U/L (ref 15–41)
Albumin: 2.1 g/dL — ABNORMAL LOW (ref 3.5–5.0)
Alkaline Phosphatase: 85 U/L (ref 38–126)
Anion gap: 8 (ref 5–15)
BUN: 17 mg/dL (ref 8–23)
CO2: 23 mmol/L (ref 22–32)
Calcium: 8.2 mg/dL — ABNORMAL LOW (ref 8.9–10.3)
Chloride: 104 mmol/L (ref 98–111)
Creatinine, Ser: 0.95 mg/dL (ref 0.61–1.24)
GFR, Estimated: >60 mL/min (ref >60)
Glucose, Bld: 134 mg/dL — ABNORMAL HIGH (ref 70–99)
Potassium: 3.9 mmol/L (ref 3.5–5.1)
Sodium: 135 mmol/L (ref 135–145)
Total Bilirubin: 0.8 mg/dL (ref 0.3–1.2)
Total Protein: 5.4 g/dL — ABNORMAL LOW (ref 6.5–8.1)

## 2023-05-07 LAB — GLUCOSE, CAPILLARY
Glucose-Capillary: 110 mg/dL — ABNORMAL HIGH (ref 70–99)
Glucose-Capillary: 130 mg/dL — ABNORMAL HIGH (ref 70–99)
Glucose-Capillary: 158 mg/dL — ABNORMAL HIGH (ref 70–99)
Glucose-Capillary: 167 mg/dL — ABNORMAL HIGH (ref 70–99)

## 2023-05-07 LAB — PROCALCITONIN: Procalcitonin: <0.1 ng/mL

## 2023-05-07 LAB — MAGNESIUM: Magnesium: 2 mg/dL (ref 1.7–2.4)

## 2023-05-07 LAB — PHOSPHORUS: Phosphorus: 3.2 mg/dL (ref 2.5–4.6)

## 2023-05-07 LAB — C-REACTIVE PROTEIN: CRP: 2.6 mg/dL — ABNORMAL HIGH (ref <1.0)

## 2023-05-07 LAB — TSH: TSH: 9.178 u[IU]/mL — ABNORMAL HIGH (ref 0.350–4.500)

## 2023-05-07 MED ORDER — LACTATED RINGERS IV SOLN
INTRAVENOUS | Status: DC
Start: 2023-05-07 — End: 2023-05-07

## 2023-05-07 MED ORDER — MELATONIN 5 MG PO TABS
5.0000 mg | ORAL_TABLET | Freq: Every day | ORAL | Status: DC
  Administered 2023-05-07 – 2023-05-12 (×6): 5 mg via ORAL
  Filled 2023-05-07 (×6): qty 1

## 2023-05-07 MED ORDER — LACTATED RINGERS IV SOLN: INTRAVENOUS | Status: AC

## 2023-05-07 MED ORDER — HYDROCORTISONE 20 MG PO TABS
20.0000 mg | ORAL_TABLET | Freq: Two times a day (BID) | ORAL | Status: DC
  Administered 2023-05-07 – 2023-05-10 (×8): 20 mg via ORAL
  Filled 2023-05-07 (×9): qty 1

## 2023-05-07 MED ORDER — SODIUM CHLORIDE 0.9% FLUSH
10.0000 mL | Freq: Two times a day (BID) | INTRAVENOUS | Status: DC
  Administered 2023-05-07 – 2023-05-13 (×12): 10 mL

## 2023-05-07 MED ORDER — PROSOURCE PLUS PO LIQD
30.0000 mL | Freq: Two times a day (BID) | ORAL | Status: DC
  Administered 2023-05-07 – 2023-05-13 (×6): 30 mL via ORAL
  Filled 2023-05-07 (×8): qty 30

## 2023-05-07 NOTE — Consult Note (Signed)
Urology Consult Note   Requesting Attending Physician:  Leroy Sea, MD Service Providing Consult: Urology  Consulting Attending: Dr. Laverle Patter   Reason for Consult:  post-op readmission  HPI: Peter Yang is seen in consultation for reasons noted above at the request of Leroy Sea, MD  This is a 67 y.o. male presents from his skilled nursing facility unresponsive with an episode of shaking per EMS, concerning for seizure. Lactic acidosis was noted on arrival.  PMH significant for Fournier's gangrene-s/p debridement with Dr. 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  He was recently hospitalized from 6/11-6/28/2024. [facility] was consulted because patient still has Penrose drain in place and sutures have not been removed.  Follow-up appointment was not attended for reasons unclear.   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 Overview:  Added automatically from request for surgery [id number] Formatting of this note might be different from the original. Added  automatically from request for surgery [id number]   Large bowel perforation (HCC) 09/27/2015   Malignant neoplasm of prostate (HCC) 10/22/2019   Myopia with astigmatism and presbyopia, bilateral 06/15/2018   Last Assessment & Plan:  Formatting of this note might be different from the original. - Wrx printed Formatting of this note might be different from the original. 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 - Stable, monitor Formatting of this note might be different from the original. Last Assessment & Plan:  - Stable, monitor   Retroperitoneal abscess (HCC) 10/02/2015   Status post corneal transplant 10/07/2016   Formatting of this note might be different from the original. Overview:  Added automatically from request for surgery [id number] Formatting of this note might be different from the original. Added automatically from request for surgery [id number]  Last Assessment & Plan:  Formatting of this note might be different from the original. - OD, 2/2 KCN - Stable, no NVK / rejection - Continue off steroids - Mo   Thoracic ascending aortic aneurysm (HCC)    TIA (transient ischemic attack) 2012   Type 2 diabetes mellitus [facility])     Past Surgical History:  Past Surgical History:  Procedure Laterality Date   BELOW KNEE LEG AMPUTATION Left 2022   CORONARY ANGIOPLASTY WITH STENT PLACEMENT  01/2022  DES RCA   IRRIGATION AND DEBRIDEMENT ABSCESS N/A 04/02/2023   Procedure: IRRIGATION AND DEBRIDEMENT ABSCESS;  Surgeon: Crist Fat, MD;  Location: [facility] OR;  Service: Urology;  Laterality: N/A;   IRRIGATION AND DEBRIDEMENT ABSCESS N/A 04/09/2023   Procedure: SCROTAL WASHOUT AND  DEBRIDEMENT WITH WOUND CLOSURE;  Surgeon: Crist Fat, MD;  Location: [facility] OR;  Service: Urology;  Laterality: N/A;  60 MINUTES   LEFT HEART CATH AND CORONARY ANGIOGRAPHY N/A 06/17/2022   Procedure: LEFT HEART CATH AND CORONARY ANGIOGRAPHY;  Surgeon: Yvonne Kendall, MD;  Location: [facility] INVASIVE CV LAB;  Service: Cardiovascular;  Laterality: N/A;   ROTATOR CUFF REPAIR Left     Medication: Current Facility-Administered Medications  Medication Dose Route Frequency Provider Last Rate Last Admin   (feeding supplement) PROSource Plus liquid 30 mL  30 mL Oral BID BM Sheikh, Omair Latif, DO   30 mL at 05/07/23 0858   albuterol (PROVENTIL) (2.5 MG/3ML) 0.083% nebulizer solution 2.5 mg  2.5 mg Nebulization Q4H PRN Sheikh, Omair Latif, DO       apixaban Everlene Balls) tablet 5 mg  5 mg Oral BID Sheikh, Omair Latif, DO   5 mg at 05/07/23 0900   atorvastatin (LIPITOR) tablet 80 mg  80 mg Oral Daily Marguerita Merles [place], DO   80 mg at 05/07/23 4098   B-complex with vitamin C tablet 1 tablet  1 tablet Oral Daily Marguerita Merles [place], DO   1 tablet at 05/07/23 0900   bisacodyl (DULCOLAX) suppository 10 mg  10 mg Rectal Daily PRN Marland Mcalpine, Omair Latif, DO       Chlorhexidine Gluconate Cloth 2 % PADS 6 each  6 each Topical Daily Sheikh, Omair Latif, DO       DAPTOmycin (CUBICIN) 800 mg in sodium chloride 0.9 % IVPB  800 mg Intravenous Q1400 Marguerita Merles [place], DO 132 mL/hr at 05/07/23 0057 800 mg at 05/07/23 0057   docusate sodium (COLACE) capsule 100 mg  100 mg Oral BID Sheikh, Kateri Mc Latif, DO       feeding supplement (ENSURE ENLIVE / ENSURE PLUS) liquid 237 mL  237 mL Oral TID BM Sheikh, Kateri Mc [place], DO   237 mL at 05/07/23 0901   ferrous sulfate tablet 325 mg  325 mg Oral Q breakfast Marguerita Merles [place], DO   325 mg at 05/07/23 0859   finasteride (PROSCAR) tablet 5 mg  5 mg Oral Daily Marguerita Merles [place], DO   5 mg at 05/07/23 0900   gabapentin (NEURONTIN) capsule 300 mg  300 mg Oral QHS Marguerita Merles [place], DO    300 mg at 05/06/23 2103   hydrALAZINE (APRESOLINE) injection 10 mg  10 mg Intravenous Q6H PRN Sheikh, Omair Latif, DO       hydrocortisone (CORTEF) tablet 20 mg  20 mg Oral BID Leroy Sea, MD   20 mg at 05/07/23 1219   HYDROmorphone (DILAUDID) injection 0.5 mg  0.5 mg Intravenous Q4H PRN Marguerita Merles [place], DO       insulin aspart (novoLOG) injection 0-15 Units  0-15 Units Subcutaneous TID WC Marguerita Merles [place], DO   2 Units at 05/07/23 1218   insulin aspart (novoLOG) injection 0-5 Units  0-5 Units Subcutaneous QHS Sheikh, Omair [place], DO       insulin glargine-yfgn [facility]) injection 20 Units  20 Units Subcutaneous QHS Marguerita Merles [place], [place]   20 Units at 05/06/23 2104   lactated ringers infusion   Intravenous Continuous Leroy Sea, MD 75 mL/hr at  05/07/23 1213 New Bag at 05/07/23 1213   LORazepam (ATIVAN) injection 2 mg  2 mg Intravenous Q6H PRN Sheikh, Omair Latif, DO       melatonin tablet 5 mg  5 mg Oral QHS Leroy Sea, MD       meropenem (MERREM) 1 g in sodium chloride 0.9 % 100 mL IVPB  1 g Intravenous Q8H Sheikh, Kateri Mc [place], DO 200 mL/hr at 05/07/23 0627 1 g at 05/07/23 0627   midodrine (PROAMATINE) tablet 10 mg  10 mg Oral TID with meals Marguerita Merles [place], DO   10 mg at 05/07/23 1219   oxyCODONE (Oxy IR/ROXICODONE) immediate release tablet 5 mg  5 mg Oral Q4H PRN Marguerita Merles [place], DO   5 mg at 05/06/23 1805   oxyCODONE (OXYCONTIN) 12 hr tablet 15 mg  15 mg Oral Q12H Sheikh, Kateri Mc [place], DO   15 mg at 05/07/23 0900   pantoprazole (PROTONIX) EC tablet 40 mg  40 mg Oral Daily Marguerita Merles [place], DO   40 mg at 05/07/23 0900   saccharomyces boulardii (FLORASTOR) capsule 250 mg  250 mg Oral BID Marguerita Merles [place], DO   250 mg at 05/07/23 0900   senna-docusate (Senokot-S) tablet 1 tablet  1 tablet Oral BID Sheikh, Omair Latif, DO       sodium chloride flush (NS) 0.9 % injection 10-40 mL  10-40 mL Intracatheter Q12H Leroy Sea, MD   10 mL at 05/07/23 0901    tamsulosin (FLOMAX) capsule 0.4 mg  0.4 mg Oral Daily Marguerita Merles [place], DO   0.4 mg at 05/07/23 0900   zinc sulfate capsule 220 mg  220 mg Oral Daily Marguerita Merles [place], DO   220 mg at 05/07/23 0900    Allergies: Allergies  Allergen Reactions   Sglt2 Inhibitors     Fournier's gangrene    Social History: Social History   Tobacco Use   Smoking status: Never   Smokeless tobacco: Never  Substance Use Topics   Alcohol use: Never   Drug use: Never    Family History Family History  Problem Relation Age of Onset   Diabetes Mother    Hypertension Mother    Heart disease Mother    Cancer Father    Heart attack Sister    Cancer Sister    Diabetes Brother     Objective   Vital signs in last 24 hours: BP 101/70 (BP Location: Left Arm)   Pulse 89   Temp 98.6 F (37 C) (Axillary)   Resp (!) 22   Ht 6\' 2"  (1.88 m)   Wt 115.7 kg   SpO2 93%   BMI 32.75 kg/m   Physical Exam General: NAD, A&O, resting, appropriate HEENT: [place]/AT Pulmonary: Normal work of breathing Cardiovascular: RRR, no cyanosis Abdomen: Soft, NTTP, nondistended GU: Foley catheter in place draining clear yellow urine.  Vertical bilateral incisions of anterior hemiscrotum with sutures still in place. Appears to have diffuse yeast infection.  Neuro: Appropriate, no focal neurological deficits  Most Recent Labs: Lab Results  Component Value Date   WBC 10.6 (H) 05/07/2023   HGB 9.2 (L) 05/07/2023   HCT 29.6 (L) 05/07/2023   PLT 214 05/07/2023    Lab Results  Component Value Date   NA 135 05/07/2023   K 3.9 05/07/2023   CL 104 05/07/2023   CO2 23 05/07/2023   BUN 17 05/07/2023   CREATININE 0.95 05/07/2023   CALCIUM 8.2 (L) 05/07/2023   MG 2.0 05/07/2023  PHOS 3.2 05/07/2023    Lab Results  Component Value Date   INR 1.3 (H) 10/07/2022   APTT 32 09/04/2022     Urine Culture: @[id number] (laburin,org,[id number],r9621)@   IMAGING: EEG adult  Result Date: 05/06/2023 Peter Quest, MD      05/06/2023  1:50 PM Patient Name: Peter Yang MRN: [id number] Epilepsy Attending: Charlsie Yang Referring Physician/Provider: Merlene Laughter, DO Date: 05/06/2023 Duration: 23.22 mins Patient history: 67yo M witnessed to be unresponsive at his nursing home with shaking activity that self resolved without any focal deficits noted, and was confused/likely postictal afterwards with gradual recovery to baseline. EEG to evaluate for seizure. Level of alertness: Awake, asleep AEDs during EEG study: None Technical aspects: This EEG study was done with scalp electrodes positioned according to the 10-20 International system of electrode placement. Electrical activity was reviewed with band pass filter of 1-70Hz , sensitivity of 7 uV/mm, display speed of 20mm/sec with a 60Hz  notched filter applied as appropriate. EEG data were recorded continuously and digitally stored.  Video monitoring was available and reviewed as appropriate. Description: The posterior dominant rhythm consists of 7 Hz activity of moderate voltage (25-35 uV) seen predominantly in posterior head regions, symmetric and reactive to eye opening and eye closing. Sleep was characterized by vertex waves, sleep spindles (12 to 14 Hz), maximal frontocentral region. EEG showed continuous generalized 5 to 7 Hz theta slowing. Physiologic photic driving was seen during photic stimulation. Hyperventilation was not performed.   ABNORMALITY - Continuous slow, generalized - Background slow IMPRESSION: This study is suggestive of mild to moderate diffuse encephalopathy, nonspecific etiology. No seizures or epileptiform discharges were seen throughout the recording. Peter Yang   MR BRAIN W WO CONTRAST  Result Date: 05/06/2023 CLINICAL DATA:  Mental status change, unknown cause. EXAM: MRI HEAD WITHOUT AND WITH CONTRAST TECHNIQUE: Multiplanar, multiecho pulse sequences of the brain and surrounding structures were obtained without and with intravenous  contrast. CONTRAST:  10mL GADAVIST GADOBUTROL 1 MMOL/ML IV SOLN COMPARISON:  MRI brain 07/28/2021.  Head CT 05/06/2023. FINDINGS: Brain: No acute infarct or hemorrhage. Unchanged encephalomalacia from prior infarct along the right temporoparietal junction. Encephalomalacia and hemosiderin staining from prior hemorrhage in the right corona radiata. No hydrocephalus or extra-axial collection. No mass or abnormal enhancement. Vascular: Normal flow voids and vessel enhancement. Skull and upper cervical spine: Normal marrow signal and enhancement. Sinuses/Orbits: Unremarkable. Other: None. IMPRESSION: 1. No acute intracranial abnormality or mass. 2. Unchanged encephalomalacia from prior infarct along the right temporoparietal junction and right corona radiata. Electronically Signed   By: Orvan Falconer M.D.   On: 05/06/2023 10:27   CT Head Wo Contrast  Result Date: 05/06/2023 CLINICAL DATA:  Delirium EXAM: CT HEAD WITHOUT CONTRAST TECHNIQUE: Contiguous axial images were obtained from the base of the skull through the vertex without intravenous contrast. RADIATION DOSE REDUCTION: This exam was performed according to the departmental dose-optimization program which includes automated exposure control, adjustment of the mA and/or kV according to patient size and/or use of iterative reconstruction technique. COMPARISON:  04/19/2023 FINDINGS: Brain: No evidence of acute infarction, hemorrhage, hydrocephalus, extra-axial collection or mass lesion/mass effect. Small chronic cortex infarcts seen along the right cerebral convexity. Generalized brain atrophy. Vascular: No hyperdense vessel or unexpected calcification. Skull: Normal. Negative for fracture or focal lesion. Sinuses/Orbits: No acute finding. IMPRESSION: 1. No acute finding. 2. Small chronic infarcts scattered in the right cerebral cortex. Electronically Signed   By: Tiburcio Pea M.D.   On: 05/06/2023 04:06  DG Chest Portable 1 View  Result Date:  05/06/2023 CLINICAL DATA:  Weakness EXAM: PORTABLE CHEST 1 VIEW COMPARISON:  04/03/2023 FINDINGS: Pulmonary insufflation is stable. Diffusely increased opacity of the right hemithorax likely relates to posteriorly layering small to moderate pleural fluid on this supine examination. Moderate left pleural effusion also noted. Superimposed left basilar atelectasis or infiltrate is present. No definite pneumothorax. Right upper extremity PICC line tip seen at the superior cavoatrial junction. Cardiac size within normal limits. Pulmonary vascularity is normal. No acute bone abnormality. IMPRESSION: 1. Small to moderate bilateral pleural effusions. 2. Left basilar atelectasis or infiltrate. Electronically Signed   By: Helyn Numbers M.D.   On: 05/06/2023 03:26    ------  Assessment:  67 y.o. male with fournier's gangrene, post op.    Recommendations: #Fourniers gangrene #tinea cruis  Quarter-inch Penrose drain and suture found in bed on arrival. Stop SGLT2 Sutures are still in place on both scrotal incisions.  I will leave them for now as the patient's anasarca has returned and these have a high risk for dehiscence.  There is an area on the left lower incision that I believe may be opened already. This will continue to weep.  Nursing to change ABD pads often. Will notify his primary surgeon, Dr. Marlou Porch.  #Urinary Retention Last foley exchange 6/19 Continue Flomax  #ESBL UTI Daptomycin and merrem per ID   Case and plan discussed with Dr. Hassell Done, NP Pager: [phone]   Please contact the urology consult pager with any further questions/concerns.

## 2023-05-07 NOTE — Evaluation (Signed)
Physical Therapy Evaluation Patient Details Name: Peter Yang MRN: [id number] DOB: [birth year] Today's Date: 05/07/2023  History of Present Illness  67 y.o. male presents to [facility] hospital from [name] on 05/06/2023 with AMS and recent seizure-like activity. EEG notable for mild to moderate encephalopathy, no seizures noted. 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 PTA patient was residing in SNF and reports working with therapies primarily at bed level due to NWB RLE and reported near fall with use of sliding board for OOB transfer.  Pt currently requires mod assist for bed mobility and refused to sit EOB with +1 assist (fear of falling as reports previous fall OOB). Currently he requires dependent mechanical lift for OOB to chair. Anticipate will be able to make progress with PT when he is allowed WB on RLE (reports he is to have an appointment with surgeon next week--?reliable).  Anticipate patient will benefit from PT to address problems listed below.Will continue to follow acutely to maximize functional mobility independence and safety.           Assistance Recommended at Discharge Frequent or constant Supervision/Assistance  If plan is discharge home, recommend the following:  Can travel by private vehicle  Two people to help with walking and/or transfers;Two people to help with bathing/dressing/bathroom;Direct supervision/assist for medications management;Direct supervision/assist for financial management;Assist for transportation;Help with stairs or ramp for entrance   No    Equipment Recommendations None recommended by PT  Recommendations for Other Services       Functional Status Assessment Patient has had a recent decline in their functional status and/or demonstrates limited ability to make significant improvements in function in a reasonable and predictable  amount of time (anticipate greater ability to progress when allowed WB on Rt foot)     Precautions / Restrictions Precautions Precautions: Fall Precaution Comments: reports fall out of bed at SNF Restrictions Weight Bearing Restrictions: Yes RLE Weight Bearing: Non weight bearing Other Position/Activity Restrictions: pt reports and last admission note states NWB      Mobility  Bed Mobility Overal bed mobility: Needs Assistance Bed Mobility: Rolling Rolling: Mod assist         General bed mobility comments: using bed rail and pad to assist    Transfers                   General transfer comment: refuses due to h/o near falls (?would at least sit EOB with +2 assist)    Ambulation/Gait                  Stairs            Wheelchair Mobility     Tilt Bed    Modified Rankin (Stroke Patients Only)       Balance                                             Pertinent Vitals/Pain Pain Assessment Pain Assessment: Faces Faces Pain Scale: Hurts little more Pain Location: Groin incisions, rt knee Pain Descriptors / Indicators: Aching, Other (Comment) (pulling) Pain Intervention(s): Limited activity within patient's tolerance, Monitored during session    Home Living Family/patient expects to be discharged to:: Skilled nursing facility Living Arrangements: Alone Available Help at  Discharge: Friend(s);Neighbor;Available PRN/intermittently Type of Home: House Home Access: Ramped entrance       Home Layout: One level Home Equipment: Agricultural consultant (2 wheels);Cane - single point;Wheelchair - manual;Tub bench      Prior Function Prior Level of Function : Needs assist             Mobility Comments: reports last time walked as Oct 2023; tried sliding board at [facility] and "almost broke my leg...not doing that again" reports use of hoyer lift for OOB to wheelchair       Hand Dominance   Dominant Hand: Right     Extremity/Trunk Assessment   Upper Extremity Assessment Upper Extremity Assessment: Defer to OT evaluation (noted lack of full extension left elbow; very limited left shoulder with rotator cuff tear per pt)    Lower Extremity Assessment Lower Extremity Assessment: RLE deficits/detail;LLE deficits/detail RLE Deficits / Details: With stretching, able to achieve -5 degrees knee extension (in 5 degrees flexion), only able to flex to 60 degrees due to knee pain and "pulling at stitches in his groin"; strength grossly 2+ hip flexion and knee extension; bandage to rt foot and heel protector LLE Deficits / Details: knee flexion contracture ~30 degrees; hip internal rotation to neutral only    Cervical / Trunk Assessment Cervical / Trunk Assessment: Kyphotic;Other exceptions Cervical / Trunk Exceptions: overweight  Communication   Communication: No difficulties  Cognition Arousal/Alertness: Awake/alert Behavior During Therapy: Flat affect Overall Cognitive Status: No family/caregiver present to determine baseline cognitive functioning                                 General Comments: unable to recall when he had 5th metatarsal amputation, but insists he is still NWB (June admission he stated WBAT, however MD note stated NWB); unclear on how long he has been at SNF (prior chart states since Oct '23, however he states ~3 weeks)        General Comments      Exercises     Assessment/Plan    PT Assessment Patient needs continued PT services  PT Problem List Decreased strength;Decreased range of motion;Decreased activity tolerance;Decreased balance;Decreased mobility;Decreased cognition;Decreased knowledge of use of DME;Decreased safety awareness;Obesity;Pain;Decreased skin integrity       PT Treatment Interventions Functional mobility training;Therapeutic activities;Therapeutic exercise;Balance training;Neuromuscular re-education;Patient/family education;DME  instruction;Cognitive remediation    PT Goals (Current goals can be found in the Care Plan section)  Acute Rehab PT Goals Patient Stated Goal: ultimately hopes to return to his home PT Goal Formulation: With patient Time For Goal Achievement: 05/21/23 Potential to Achieve Goals: Fair    Frequency Min 1X/week     Co-evaluation               AM-PAC PT "6 Clicks" Mobility  Outcome Measure Help needed turning from your back to your side while in a flat bed without using bedrails?: A Lot Help needed moving from lying on your back to sitting on the side of a flat bed without using bedrails?: Total Help needed moving to and from a bed to a chair (including a wheelchair)?: Total Help needed standing up from a chair using your arms (e.g., wheelchair or bedside chair)?: Total Help needed to walk in hospital room?: Total Help needed climbing 3-5 steps with a railing? : Total 6 Click Score: 7    End of Session   Activity Tolerance: Other (comment) (self-limiting due to fear of  falling) Patient left: in bed;with call bell/phone within reach Nurse Communication: Need for lift equipment PT Visit Diagnosis: Muscle weakness (generalized) (M62.81);History of falling (Z91.81)    Time: 4696-2952 PT Time Calculation (min) (ACUTE ONLY): 21 min   Charges:   PT Evaluation $PT Eval Low Complexity: 1 Low   PT General Charges $$ ACUTE PT VISIT: 1 Visit          Jerolyn Center, PT Acute Rehabilitation Services  Office [phone]   Zena Amos 05/07/2023, 8:55 AM

## 2023-05-07 NOTE — TOC CM/SW Note (Signed)
Chart reviewed.  CM attempted to meet patient bedside to complete initial assessment. Patient was asleep and did not respond to verbal stimulation. CM will re attempt later .

## 2023-05-07 NOTE — Plan of Care (Signed)

## 2023-05-07 NOTE — TOC Initial Note (Signed)
Transition of Care [facility]) - Initial/Assessment Note    Patient Details  Name: Peter Yang MRN: [id number] Date of Birth: [birth year]  Transition of Care [facility]) CM/SW Contact:    Mearl Latin, LCSW Phone Number: 05/07/2023, 4:43 PM  Clinical Narrative:                 Patient admitted from long term care at [facility] since April. Per [name], patient owes them money and has yet to produce the documents needed for a Medicaid application. CSW will speak to the patient regarding this issue as he was supposed to have a friend bring those documents last hospital admission.  Expected Discharge Plan: Skilled Nursing Facility Barriers to Discharge: Continued Medical Work up   Patient Goals and CMS Choice            Expected Discharge Plan and Services In-house Referral: Clinical Social Work                                            Prior Living Arrangements/Services     Patient language and need for interpreter reviewed:: Yes        Need for Family Participation in Patient Care: No (Comment) Care giver support system in place?: No (comment)   Criminal Activity/Legal Involvement Pertinent to Current Situation/Hospitalization: No - Comment as needed  Activities of Daily Living      Permission Sought/Granted                  Emotional Assessment       Orientation: : Oriented to Self, Oriented to Place, Oriented to  Time, Oriented to Situation Alcohol / Substance Use: Not Applicable Psych Involvement: No (comment)  Admission diagnosis:  Altered mental status [R41.82] Seizure-like activity (HCC) [R56.9] AMS (altered mental status) [R41.82] Patient Active Problem List   Diagnosis Date Noted   AMS (altered mental status) 05/07/2023   Seizure-like activity (HCC) 05/06/2023   Altered mental status 05/06/2023   Fournier's gangrene in male 04/02/2023   Fournier disease 04/02/2023   Necrotizing soft tissue infection 04/02/2023   UTI (urinary  tract infection) due to urinary indwelling catheter (HCC) 04/01/2023   S/P BKA (below knee amputation) unilateral, left (HCC) 04/01/2023   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) 04/01/2023   Long term current use of systemic steroids 04/01/2023   Thrombocytopenia (HCC) 10/01/2022   Orthostatic hypotension 09/24/2022   HFrEF (heart failure with reduced ejection fraction) (HCC) 09/23/2022   Counseling and coordination of care 09/15/2022   Pain 09/14/2022   High risk medication use 09/14/2022   Need for emotional support 09/12/2022   Mood altered 09/11/2022   Medication management 09/11/2022   Palliative care by specialist 09/08/2022   Goals of care, counseling/discussion 09/08/2022   Physical deconditioning 09/05/2022   Gross hematuria 08/31/2022   Type 2 diabetes mellitus with diabetic peripheral angiopathy without gangrene, without long-term current use of insulin (HCC)    Impaired functional mobility, balance, gait, and endurance 08/03/2022   Obstructive sleep apnea syndrome 08/03/2022   Pleural effusion, bilateral 08/03/2022   Catheter-associated urinary tract infection (HCC) 08/03/2022   History of urinary retention 08/03/2022   Mitral valve regurgitation 08/03/2022   Thoracic ascending aortic aneurysm (HCC) 07/23/2022   Borderline low blood pressure determined by examination 07/04/2022   Coronary artery disease due to lipid rich plaque 06/25/2022  Hematuria 06/25/2022   History of non-ST elevation myocardial infarction (NSTEMI) 06/15/2022   PAF (paroxysmal atrial fibrillation) (HCC) 06/15/2022   Dyslipidemia 06/15/2022   PVD (peripheral vascular disease) (HCC) 06/15/2022   Pressure ulcer of sacral region, stage 2 (HCC) 06/15/2022   Chronic HFrEF (heart failure with reduced ejection fraction) (HCC) 06/15/2022   DNR (do not resuscitate) 06/15/2022   Ischemic cardiomyopathy 02/12/2022   Need for assistance with personal care 08/03/2021   Diabetic  polyneuropathy associated with type 2 diabetes mellitus (HCC) 07/28/2021   Prolonged QT interval 07/28/2021   Moderate protein-calorie malnutrition (HCC) 06/29/2021   Chronic insomnia 03/31/2021   BPH with obstruction/lower urinary tract symptoms 02/02/2018   Hyperlipidemia 04/15/2016   Vitamin D deficiency 04/09/2016   Essential hypertension 01/25/2016   PCP:  Georgann Housekeeper, MD Pharmacy:  No Pharmacies Listed    Social Determinants of Health (SDOH) Social History: SDOH Screenings   Food Insecurity: No Food Insecurity (04/02/2023)  Housing: Low Risk  (04/02/2023)  Transportation Needs: No Transportation Needs (04/02/2023)  Utilities: Not At Risk (04/02/2023)  Financial Resource Strain: Low Risk  (08/13/2018)   Received from [facility] visits prior to 12/21/2022.  Tobacco Use: Low Risk  (05/06/2023)   SDOH Interventions:     Readmission Risk Interventions    05/07/2023    4:42 PM 10/24/2022   11:21 AM 09/02/2022   10:36 AM  Readmission Risk Prevention Plan  Transportation Screening Complete Complete Complete  Medication Review [facility])  Complete Complete  PCP or Specialist appointment within 3-5 days of discharge Complete Complete Complete  HRI or Home Care Consult Complete Complete Complete  SW Recovery Care/Counseling Consult Complete Complete Complete  Palliative Care Screening Not Applicable Not Applicable Not Applicable  Skilled Nursing Facility Complete Complete Complete

## 2023-05-07 NOTE — Evaluation (Signed)
Occupational Therapy Evaluation Patient Details Name: Peter Yang MRN: [id number] DOB: [birth year] Today's Date: 05/07/2023   History of Present Illness 67 y.o. male presents to [facility] hospital from [name] on 05/06/2023 with AMS and recent seizure-like activity. EEG notable for mild to moderate encephalopathy, no seizures noted. PMHx:  HTN, CHF, L BKA, CAD, HLD, DM, BPM,  NSTEMI, Rt 5th ray amputation   Clinical Impression   Pt has been in skilled nursing facility placement since October 2023. At baseline, pt requires assistance with ADLs and use of Hoyer-style lift for functional transfers. Pt now presents with decreased activity tolerance, generalized B UE weakness, impaired cognition, and decreased ability to participate in ADLs and bed mobility. Pt currently demonstrates ability to complete UB ADLs with Set up to Max assist from bed level, LB ADLs with Total assist +2 from bed level, and rolling in the bed with Mod assist. Pt will benefit from acute skilled OT services to address deficits outlined below, decrease caregiver burden, and increase safety and independence with ADLs and bed mobility. Post acute discharge, OT recommends return to skilled nursing facility with continued skilled OT services at the discretion of facility.      Recommendations for follow up therapy are one component of a multi-disciplinary discharge planning process, led by the attending physician.  Recommendations may be updated based on patient status, additional functional criteria and insurance authorization.   Assistance Recommended at Discharge Frequent or constant Supervision/Assistance  Patient can return home with the following Two people to help with walking and/or transfers;Two people to help with bathing/dressing/bathroom;Assistance with cooking/housework;Assistance with feeding;Direct supervision/assist for medications management;Direct supervision/assist for financial management;Assist for  transportation;Help with stairs or ramp for entrance (Set up for self-feeding)    Functional Status Assessment  Patient has had a recent decline in their functional status and demonstrates the ability to make significant improvements in function in a reasonable and predictable amount of time.  Equipment Recommendations  Other (comment) (Defer to next level of care)    Recommendations for Other Services       Precautions / Restrictions Precautions Precautions: Fall Precaution Comments: reports fall out of bed at SNF Restrictions Weight Bearing Restrictions: Yes RLE Weight Bearing: Non weight bearing Other Position/Activity Restrictions: pt reports and last admission note states NWB      Mobility Bed Mobility Overal bed mobility: Needs Assistance Bed Mobility: Rolling Rolling: Mod assist         General bed mobility comments: Using bed rail and pad to assist and with cues for hand placement/technique    Transfers                   General transfer comment: Pt refused transferring to EOB this session.      Balance                                           ADL either performed or assessed with clinical judgement   ADL Overall ADL's : Needs assistance/impaired Eating/Feeding: Set up;Bed level   Grooming: Minimal assistance;Bed level   Upper Body Bathing: Maximal assistance;Bed level   Lower Body Bathing: Total assistance;+2 for physical assistance;Bed level   Upper Body Dressing : Maximal assistance;Bed level   Lower Body Dressing: Total assistance;+2 for physical assistance;Bed level       Toileting- Clothing Manipulation and Hygiene: Total assistance;+2 for physical assistance;Bed level  General ADL Comments: Pt presents with decreased activity tolerance. Pt refusing transfer to EOB this session. Per chart review, pt is at similar functional level as he was in June 2024. Pt has potential to increase funcitonal level with  participation in skilled rehab services.     Vision Baseline Vision/History: 1 Wears glasses (Progressives; Pt reports his glasses are broken.) Ability to See in Adequate Light: 1 Impaired Patient Visual Report: No change from baseline       Perception     Praxis      Pertinent Vitals/Pain Pain Assessment Pain Assessment: Faces Faces Pain Scale: Hurts little more Pain Location: Groin incisions, Right knee, buttocks, Left shoulder Pain Descriptors / Indicators: Aching, Discomfort, Grimacing Pain Intervention(s): Limited activity within patient's tolerance, Monitored during session, Repositioned     Hand Dominance Right   Extremity/Trunk Assessment Upper Extremity Assessment Upper Extremity Assessment: Generalized weakness;LUE deficits/detail LUE Deficits / Details: ROM 30 degrees flexion in shoulder. Hx of two failed rotator cuff surgeries. Elbow flexion WFL. Mildly impaired elbow extension. Overall strength: Shoulder 2/5, elbow 3+/5, grip 3+/5 LUE Coordination: decreased fine motor;decreased gross motor   Lower Extremity Assessment Lower Extremity Assessment: Defer to PT evaluation   Cervical / Trunk Assessment Cervical / Trunk Assessment: Kyphotic;Other exceptions Cervical / Trunk Exceptions: overweight   Communication Communication Communication: No difficulties   Cognition Arousal/Alertness: Awake/alert Behavior During Therapy: Flat affect Overall Cognitive Status: No family/caregiver present to determine baseline cognitive functioning                                 General Comments: Pt is disoriented to month, season, reason for being in hospital, and locaiton (able to state he is in hospital, but states he is in [employer]). Pt reports he has only been placed in SNF "a few weeks." Per chart review, pt has been placed in SNF since Oct 2023. Pt about to follow 1 step commands inconsistently with increased time and occasional verbal and tactile cues.      General Comments  VSS on RA from bed level throughout session.    Exercises     Shoulder Instructions      Home Living Family/patient expects to be discharged to:: Skilled nursing facility Living Arrangements: Alone Available Help at Discharge: Friend(s);Neighbor;Available PRN/intermittently (Per chart review, pt has limited support available.) Type of Home: House Home Access: Ramped entrance (Ramp at back door, 3 steps up then step into house at front door and garage door)     Home Layout: One level     Bathroom Shower/Tub: Tub/shower unit;Curtain   Bathroom Toilet: Standard Bathroom Accessibility: Yes How Accessible: Accessible via walker Home Equipment: Rolling Walker (2 wheels);Cane - single point;Wheelchair - manual;Tub bench   Additional Comments: Pt has been placed in SNF since October 2023. Pt reports he intends to return home to live Independently. Per chart review, pt has limited assistance available from friends. However, OT continues to recommend SNF at this time.      Prior Functioning/Environment Prior Level of Function : Needs assist             Mobility Comments: Pt is an unreliable reporter. Pt reported to PT on this day that he last ambulated with prosthetic in October 2023 and reported use of Hoyer lift for transfers to wheelchair at [facility]. Pt further reported to PT that he attempted sliding board transfer at SNF and "almost broke my leg...not doing that  again." This informaiton appears to be accurate. However, to this OT, pt reports he uses RW at SNF and "only uses lift for doctor's appointments." ADLs Comments: Pt reports he is Independent with all ADLs at SNF. However, per chart review, pt has largely required Min to Max assist for UB ADLs and Total assist for LB ADLs since at least June 2024 and in November 2023 required Min assist for UB ADLs and Mod assist for LB ADLs. Per chart review, pt was Independent/Mod I with ADLs prior to Oct 2023.        OT  Problem List: Decreased strength;Decreased range of motion;Decreased activity tolerance;Impaired balance (sitting and/or standing);Decreased coordination;Decreased cognition;Decreased safety awareness;Decreased knowledge of use of DME or AE;Decreased knowledge of precautions;Obesity;Impaired UE functional use;Pain      OT Treatment/Interventions: Self-care/ADL training;Therapeutic exercise;DME and/or AE instruction;Therapeutic activities;Cognitive remediation/compensation;Patient/family education;Balance training    OT Goals(Current goals can be found in the care plan section) Acute Rehab OT Goals Patient Stated Goal: To return home Independent OT Goal Formulation: With patient Time For Goal Achievement: 05/21/23 Potential to Achieve Goals: Fair ADL Goals Pt Will Perform Grooming: with min guard assist;sitting (sitting EOB with Fair balance for two or more tasks) Pt Will Perform Upper Body Dressing: with min guard assist;sitting (with cues for sequencing and compensatory strategies as needed) Additional ADL Goal #1: Patient will perform bed mobility during and in preparation for ADLs with Min guard assist, increased time, and cues as needed for sequencing, safety, and technique.  OT Frequency: Min 1X/week    Co-evaluation              AM-PAC OT "6 Clicks" Daily Activity     Outcome Measure Help from another person eating meals?: A Little Help from another person taking care of personal grooming?: A Little Help from another person toileting, which includes using toliet, bedpan, or urinal?: Total Help from another person bathing (including washing, rinsing, drying)?: Total Help from another person to put on and taking off regular upper body clothing?: A Lot Help from another person to put on and taking off regular lower body clothing?: Total 6 Click Score: 11   End of Session Nurse Communication: Mobility status  Activity Tolerance: Patient limited by lethargy Patient left: in  bed;with call bell/phone within reach;with bed alarm set  OT Visit Diagnosis: Muscle weakness (generalized) (M62.81);Other symptoms and signs involving cognitive function;Pain                Time: 1002-1023 OT Time Calculation (min): 21 min Charges:  OT General Charges $OT Visit: 1 Visit OT Evaluation $OT Eval Moderate Complexity: 1 Mod  [address] LaneMolson Coors Brewing., OTR/L, MA Acute Rehab [phone]   Lendon Colonel 05/07/2023, 1:08 PM

## 2023-05-07 NOTE — NC FL2 (Signed)
[place] MEDICAID FL2 LEVEL OF CARE FORM     IDENTIFICATION  Patient Name: Peter Yang Birthdate: 1956/05/23 Sex: male Admission Date (Current Location): 05/06/2023  [facility] and [place] Number:  Producer, television/film/video and Address:  [facility], [address], [place], [place] [address]      Provider Number: [id number]  Attending Physician Name and Address:  Leroy Sea, MD  Relative Name and Phone Number:       Current Level of Care: Hospital Recommended Level of Care: Skilled Nursing Facility Prior Approval Number:    Date Approved/Denied:   PASRR Number: [id number]  Discharge Plan: SNF    Current Diagnoses: 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) 04/01/2023   Long term current use of systemic steroids 04/01/2023   Thrombocytopenia (HCC) 10/01/2022   Orthostatic hypotension 09/24/2022   HFrEF (heart failure with reduced ejection fraction) (HCC) 09/23/2022   Counseling and coordination of care 09/15/2022   Pain 09/14/2022   High risk medication use 09/14/2022   Need for emotional support 09/12/2022   Mood altered 09/11/2022   Medication management 09/11/2022   Palliative care by specialist 09/08/2022   Goals of care, counseling/discussion 09/08/2022   Physical deconditioning 09/05/2022   Gross hematuria 08/31/2022   Type 2 diabetes mellitus with diabetic peripheral angiopathy without gangrene, without long-term current use of insulin (HCC)    Impaired functional  mobility, balance, gait, and endurance 08/03/2022   Obstructive sleep apnea syndrome 08/03/2022   Pleural effusion, bilateral 08/03/2022   Catheter-associated urinary tract infection (HCC) 08/03/2022   History of urinary retention 08/03/2022   Mitral valve regurgitation 08/03/2022   Thoracic ascending aortic aneurysm (HCC) 07/23/2022   Borderline low blood pressure determined by examination 07/04/2022   Coronary artery disease due to lipid rich plaque 06/25/2022   Hematuria 06/25/2022   History of non-ST elevation myocardial infarction (NSTEMI) 06/15/2022   PAF (paroxysmal atrial fibrillation) (HCC) 06/15/2022   Dyslipidemia 06/15/2022   PVD (peripheral vascular disease) (HCC) 06/15/2022   Pressure ulcer of sacral region, stage 2 (HCC) 06/15/2022   Chronic HFrEF (heart failure with reduced ejection fraction) (HCC) 06/15/2022   DNR (do not resuscitate) 06/15/2022   Ischemic cardiomyopathy 02/12/2022   Need for assistance with personal care 08/03/2021   Diabetic polyneuropathy associated with type 2 diabetes mellitus (HCC) 07/28/2021   Prolonged QT interval 07/28/2021   Moderate protein-calorie malnutrition (HCC) 06/29/2021   Chronic insomnia 03/31/2021   BPH with obstruction/lower urinary tract symptoms 02/02/2018   Hyperlipidemia 04/15/2016   Vitamin D deficiency 04/09/2016   Essential hypertension 01/25/2016    Orientation RESPIRATION BLADDER Height & Weight     Self, Time, Situation, Place  Normal Incontinent, Indwelling catheter Weight: 255 lb 1.2 oz (115.7 kg) Height:  6\' 2"  (188 cm)  BEHAVIORAL SYMPTOMS/MOOD NEUROLOGICAL BOWEL NUTRITION STATUS      Continent Diet (See dc summary)  AMBULATORY STATUS COMMUNICATION OF NEEDS Skin   Limited Assist Verbally PU Stage and Appropriate Care (Stage II on buttocks; skin tear on penis; toe amputation;)  Personal Care Assistance Level of Assistance  Bathing, Feeding, Dressing Bathing Assistance: Limited  assistance Feeding assistance: Independent Dressing Assistance: Limited assistance     Functional Limitations Info  Sight Sight Info: Impaired        SPECIAL CARE FACTORS FREQUENCY                       Contractures Contractures Info: Not present    Additional Factors Info  Code Status, Allergies, Isolation Precautions Code Status Info: DNR Allergies Info: Sglt2 Inhibitors     Isolation Precautions Info: ESBL 04/15/23     Current Medications (05/07/2023):  This is the current hospital active medication list Current Facility-Administered Medications  Medication Dose Route Frequency Provider Last Rate Last Admin   (feeding supplement) PROSource Plus liquid 30 mL  30 mL Oral BID BM Sheikh, Omair Latif, DO   30 mL at 05/07/23 0858   albuterol (PROVENTIL) (2.5 MG/3ML) 0.083% nebulizer solution 2.5 mg  2.5 mg Nebulization Q4H PRN Sheikh, Omair Latif, DO       apixaban Everlene Balls) tablet 5 mg  5 mg Oral BID Sheikh, Omair Latif, DO   5 mg at 05/07/23 0900   atorvastatin (LIPITOR) tablet 80 mg  80 mg Oral Daily Marguerita Merles [place], DO   80 mg at 05/07/23 1610   B-complex with vitamin C tablet 1 tablet  1 tablet Oral Daily Marguerita Merles [place], DO   1 tablet at 05/07/23 0900   bisacodyl (DULCOLAX) suppository 10 mg  10 mg Rectal Daily PRN Marland Mcalpine, Omair Latif, DO       Chlorhexidine Gluconate Cloth 2 % PADS 6 each  6 each Topical Daily Sheikh, Omair Latif, DO       DAPTOmycin (CUBICIN) 800 mg in sodium chloride 0.9 % IVPB  800 mg Intravenous Q1400 Marguerita Merles [place], DO 132 mL/hr at 05/07/23 0057 800 mg at 05/07/23 0057   docusate sodium (COLACE) capsule 100 mg  100 mg Oral BID Sheikh, Kateri Mc Latif, DO       feeding supplement (ENSURE ENLIVE / ENSURE PLUS) liquid 237 mL  237 mL Oral TID BM Sheikh, Kateri Mc [place], DO   237 mL at 05/07/23 0901   ferrous sulfate tablet 325 mg  325 mg Oral Q breakfast Marguerita Merles [place], DO   325 mg at 05/07/23 0859   finasteride (PROSCAR) tablet 5 mg  5 mg  Oral Daily Marguerita Merles [place], DO   5 mg at 05/07/23 0900   gabapentin (NEURONTIN) capsule 300 mg  300 mg Oral QHS Marguerita Merles [place], DO   300 mg at 05/06/23 2103   hydrALAZINE (APRESOLINE) injection 10 mg  10 mg Intravenous Q6H PRN Sheikh, Omair Latif, DO       hydrocortisone (CORTEF) tablet 20 mg  20 mg Oral BID Leroy Sea, MD   20 mg at 05/07/23 1219   HYDROmorphone (DILAUDID) injection 0.5 mg  0.5 mg Intravenous Q4H PRN Marguerita Merles Latif, DO       insulin aspart (novoLOG) injection 0-15 Units  0-15 Units Subcutaneous TID WC Marguerita Merles [place], DO   2 Units at 05/07/23 1218   insulin aspart (novoLOG) injection 0-5 Units  0-5 Units Subcutaneous QHS Sheikh, Omair [place], DO       insulin glargine-yfgn [facility]) injection 20 Units  20 Units Subcutaneous QHS Marguerita Merles [place], [place]   20 Units at 05/06/23 2104   lactated ringers infusion   Intravenous Continuous Leroy Sea, MD 75 mL/hr at 05/07/23  1213 New Bag at 05/07/23 1213   LORazepam (ATIVAN) injection 2 mg  2 mg Intravenous Q6H PRN Sheikh, Omair Latif, DO       melatonin tablet 5 mg  5 mg Oral QHS Leroy Sea, MD       meropenem (MERREM) 1 g in sodium chloride 0.9 % 100 mL IVPB  1 g Intravenous Q8H Sheikh, Kateri Mc [place], DO 200 mL/hr at 05/07/23 0627 1 g at 05/07/23 0627   midodrine (PROAMATINE) tablet 10 mg  10 mg Oral TID with meals Marguerita Merles [place], DO   10 mg at 05/07/23 1219   oxyCODONE (Oxy IR/ROXICODONE) immediate release tablet 5 mg  5 mg Oral Q4H PRN Marguerita Merles Latif, DO   5 mg at 05/06/23 1805   oxyCODONE (OXYCONTIN) 12 hr tablet 15 mg  15 mg Oral Q12H Sheikh, Kateri Mc [place], DO   15 mg at 05/07/23 0900   pantoprazole (PROTONIX) EC tablet 40 mg  40 mg Oral Daily Marguerita Merles [place], DO   40 mg at 05/07/23 0900   saccharomyces boulardii (FLORASTOR) capsule 250 mg  250 mg Oral BID Marguerita Merles Latif, DO   250 mg at 05/07/23 0900   senna-docusate (Senokot-S) tablet 1 tablet  1 tablet Oral BID Sheikh, Omair  Latif, DO       sodium chloride flush (NS) 0.9 % injection 10-40 mL  10-40 mL Intracatheter Q12H Leroy Sea, MD   10 mL at 05/07/23 0901   tamsulosin (FLOMAX) capsule 0.4 mg  0.4 mg Oral Daily Marguerita Merles Latif, DO   0.4 mg at 05/07/23 0900   zinc sulfate capsule 220 mg  220 mg Oral Daily Marguerita Merles Latif, DO   220 mg at 05/07/23 0900     Discharge Medications: Please see discharge summary for a list of discharge medications.  Relevant Imaging Results:  Relevant Lab Results:   Additional Information SSN # [id number]  Mearl Latin, LCSW

## 2023-05-07 NOTE — Progress Notes (Signed)
PROGRESS NOTE                                                                                                                                                                                                             Patient Demographics:    Peter Yang, is a 67 y.o. male, DOB - 1956-03-03, ZOX:[id number]  Outpatient Primary MD for the patient is Peter Housekeeper, MD    LOS - 0  Admit date - 05/06/2023    Chief Complaint  Patient presents with   Altered Mental Status       Brief Narrative (HPI from H&P)    67 y.o. male with medical history significant for but not limited to CAD, diabetic polyneuropathy with diabetes mellitus type 2, history of NSTEMI, history of stent placement, hyperlipidemia, BPH, history of large bowel perforation, history of PAF, ischemic cardiomyopathy, history of TIA and stroke, history of diabetic foot ulcer, history of BKA on the left as well as other comorbidities including systolic and diastolic combined CHF and LVEF of 35% and grade 2 diastolic dysfunction who was recently hospitalized on 04/01/2023 until 04/18/2023 from his skilled nursing facility urinary retention, Foley catheter placement and subsequent development and finding of Fournier's gangrene.  He was seen by urology underwent surgical debridement twice first on 04/02/2023 and then again on 04/09/2023 by Dr. Marlou Porch, he was placed on IV daptomycin and ertapenem and discharged back to nursing home.  He now comes back with altered mental status.   Subjective:    Peter Yang today has, No headache, No chest pain, No abdominal pain - No Nausea, No new weakness tingling or numbness, no SOB.   Assessment  & Plan :    Metabolic encephalopathy with hypotension and some generalized shaking likely due to hypotension and dehydration in the setting of chronic adrenal insufficiency.  Do not think this was a seizure, he has history of adrenal  insufficiency is chronically on steroids and was clinically dehydrated and hypotensive, MRI brain and EEG nonacute, increased home dose steroid, hydrate with IV fluids, PT OT and monitor.  This was discussed by neurologist upon admission.  Recent history of Fournier's gangrene.  Debridement twice -  first on 04/02/2023 and then again on 04/09/2023 by Dr. Marlou Porch, he was placed on IV daptomycin and ertapenem and discharged back to nursing home, continue present antibiotics,  has Foley catheter and right arm PICC line, urology to see.  Do not think there is an acute change.  History of urinary retention.  Foley catheter.  Last exchange on 04/09/2023, urology consulted.  Chronic adrenal insufficiency with hypotension, on chronic steroids.  Increase steroids, hydrate and monitor.  Moderate to severe protein calorie malnutrition.  Protein supplements.  Anemia of chronic disease.  Stable.  Dehydration, hypoperfusion induced lactic acidosis, hypomagnesemia and hyponatremia.  Resolved after IV fluids.  No signs of sepsis.  Ignacia replaced.  Chronic combined systolic and diastolic CHF.  EF 35%.  Blood pressure too low for beta-blocker or ACE inhibitor, currently compensated.  Monitor.    Hypertension.  Currently blood pressure is soft.  Midodrine, higher than home dose steroids and IV fluids.  Paroxysmal atrial fibrillation.  Not on rate controlling agents, on Eliquis continue.  [place] vas 2 score of greater than 3.  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  Supportive care.  PT OT.  DM type II.  On long-acting insulin along with sliding scale.  Monitor.  Lab Results  Component Value Date   HGBA1C 7.0 (H) 04/02/2023    CBG (last 3)  Recent Labs    05/06/23 1652 05/06/23 2055 05/07/23 0812  GLUCAP 157* 142* 110*         Condition - Extremely Guarded  Family Communication  :  Peter Yang [phone]  - 05/07/23  Code Status :  DNR  Consults  :  Neuro,  Urology  PUD Prophylaxis :     Procedures  :     EEG -  This study is suggestive of mild to moderate diffuse encephalopathy, nonspecific etiology. No seizures or epileptiform discharges were seen throughout the recording.  MRI  1. No acute intracranial abnormality or mass. 2. Unchanged encephalomalacia from prior infarct along the right temporoparietal junction and right corona radiata.      Disposition Plan  :    Status is: Observation  DVT Prophylaxis  :    SCDs Start: 05/06/23 1531 apixaban (ELIQUIS) tablet 5 mg     Lab Results  Component Value Date   PLT 214 05/07/2023    Diet :  Diet Order             Diet heart healthy/carb modified Room service appropriate? Yes; Fluid consistency: Thin  Diet effective now                    Inpatient Medications  Scheduled Meds:  (feeding supplement) PROSource Plus  30 mL Oral BID BM   apixaban  5 mg Oral BID   atorvastatin  80 mg Oral Daily   B-complex with vitamin C  1 tablet Oral Daily   Chlorhexidine Gluconate Cloth  6 each Topical Daily   docusate sodium  100 mg Oral BID   feeding supplement  237 mL Oral TID BM   ferrous sulfate  325 mg Oral Q breakfast   finasteride  5 mg Oral Daily   gabapentin  300 mg Oral QHS   hydrocortisone  20 mg Oral BID   insulin aspart  0-15 Units Subcutaneous TID WC   insulin aspart  0-5 Units Subcutaneous QHS   insulin glargine-yfgn  20 Units Subcutaneous QHS   melatonin  5 mg Oral QHS   midodrine  10 mg Oral TID with meals   oxyCODONE  15 mg Oral Q12H   pantoprazole  40 mg Oral Daily   saccharomyces boulardii  250 mg Oral BID   senna-docusate  1 tablet Oral BID   sodium chloride flush  10-40 mL Intracatheter Q12H   tamsulosin  0.4 mg Oral Daily   zinc sulfate  220 mg Oral Daily   Continuous Infusions:  DAPTOmycin (CUBICIN) 800 mg in sodium chloride 0.9 % IVPB 800 mg (05/07/23 0057)   lactated ringers     meropenem (MERREM) IV 1 g (05/07/23 0627)   PRN Meds:.albuterol,  bisacodyl, hydrALAZINE, HYDROmorphone (DILAUDID) injection, LORazepam, oxyCODONE    Objective:   Vitals:   05/06/23 2328 05/07/23 0446 05/07/23 0609 05/07/23 0813  BP: 97/66 104/73  104/74  Pulse:  76 73 89  Resp: 12 14 11 14   Temp: (!) 97.1 F (36.2 C) 97.7 F (36.5 C)  98.4 F (36.9 C)  TempSrc: Oral Oral  Oral  SpO2: 98% 94% 97% 92%  Weight:   115.7 kg   Height:        Wt Readings from Last 3 Encounters:  05/07/23 115.7 kg  04/19/23 109.3 kg  04/16/23 110.7 kg     Intake/Output Summary (Last 24 hours) at 05/07/2023 0951 Last data filed at 05/07/2023 0901 Gross per 24 hour  Intake 186 ml  Output 652 ml  Net -466 ml     Physical Exam  Awake Alert, No new F.N deficits, Normal affect [place].AT,PERRAL Supple Neck, No JVD,   Symmetrical Chest wall movement, Good air movement bilaterally, CTAB RRR,No Gallops,Rubs or new Murmurs,  +ve B.Sounds, Abd Soft, No tenderness,   No Cyanosis, Clubbing or edema  Scrotum with Foley catheter, looks stable, sutures in place, L BKA  RN pressure injury documentation: Pressure Injury 08/03/22 Buttocks Mid Stage 2 -  Partial thickness loss of dermis presenting as a shallow open injury with a red, pink wound bed without slough. area of red blanchable and non blanchable, and areas with skin sloughed off (Active)  08/03/22 1513  Location: Buttocks  Location Orientation: Mid  Staging: Stage 2 -  Partial thickness loss of dermis presenting as a shallow open injury with a red, pink wound bed without slough.  Wound Description (Comments): area of red blanchable and non blanchable, and areas with skin sloughed off  Present on Admission: Yes  Dressing Type Foam - Lift dressing to assess site every shift 04/18/23 1210      Data Review:    Recent Labs  Lab 05/06/23 0058 05/06/23 0830 05/07/23 0625  WBC 9.2 7.8 10.6*  HGB 9.1* 8.5* 9.2*  HCT 30.7* 28.7* 29.6*  PLT 203 206 214  MCV 81.2 81.1 81.3  MCH 24.1* 24.0* 25.3*  MCHC 29.6*  29.6* 31.1  RDW 22.3* 22.5* 22.6*  LYMPHSABS  --  1.4 0.3*  MONOABS  --  0.8 0.6  EOSABS  --  0.5 0.5  BASOSABS  --  0.1 0.1    Recent Labs  Lab 05/06/23 0058 05/06/23 0100 05/06/23 0351 05/06/23 0830 05/07/23 0625  NA 133*  --   --  133* 135  K 4.1  --   --  3.8 3.9  CL 104  --   --  104 104  CO2 19*  --   --  22 23  ANIONGAP 10  --   --  7 8  GLUCOSE 184*  --   --  99 134*  BUN 20  --   --  17 17  CREATININE 1.10  --   --  1.01 0.95  AST 26  --   --  25  26  ALT 16  --   --  16 16  ALKPHOS 94  --   --  82 85  BILITOT 0.9  --   --  0.8 0.8  ALBUMIN 2.4*  --   --  2.3* 2.1*  CRP  --   --   --   --  2.6*  PROCALCITON  --   --   --   --  <0.10  LATICACIDVEN  --  2.9* 1.4  --   --   TSH  --   --   --   --  9.178*  MG 1.7  --   --  1.6* 2.0  CALCIUM 8.3*  --   --  8.1* 8.2*      Recent Labs  Lab 05/06/23 0058 05/06/23 0100 05/06/23 0351 05/06/23 0830 05/07/23 0625  CRP  --   --   --   --  2.6*  PROCALCITON  --   --   --   --  <0.10  LATICACIDVEN  --  2.9* 1.4  --   --   TSH  --   --   --   --  9.178*  MG 1.7  --   --  1.6* 2.0  CALCIUM 8.3*  --   --  8.1* 8.2*   Lab Results  Component Value Date   HGBA1C 7.0 (H) 04/02/2023    Recent Labs    05/07/23 0625  TSH 9.178*     Radiology Reports EEG adult  Result Date: 05/06/2023 Charlsie Quest, MD     05/06/2023  1:50 PM Patient Name: Daren Yeagle MRN: [id number] Epilepsy Attending: Charlsie Quest Referring Physician/Provider: Merlene Laughter, DO Date: 05/06/2023 Duration: 23.22 mins Patient history: 67yo M witnessed to be unresponsive at his nursing home with shaking activity that self resolved without any focal deficits noted, and was confused/likely postictal afterwards with gradual recovery to baseline. EEG to evaluate for seizure. Level of alertness: Awake, asleep AEDs during EEG study: None Technical aspects: This EEG study was done with scalp electrodes positioned according to the 10-20  International system of electrode placement. Electrical activity was reviewed with band pass filter of 1-70Hz , sensitivity of 7 uV/mm, display speed of 86mm/sec with a 60Hz  notched filter applied as appropriate. EEG data were recorded continuously and digitally stored.  Video monitoring was available and reviewed as appropriate. Description: The posterior dominant rhythm consists of 7 Hz activity of moderate voltage (25-35 uV) seen predominantly in posterior head regions, symmetric and reactive to eye opening and eye closing. Sleep was characterized by vertex waves, sleep spindles (12 to 14 Hz), maximal frontocentral region. EEG showed continuous generalized 5 to 7 Hz theta slowing. Physiologic photic driving was seen during photic stimulation. Hyperventilation was not performed.   ABNORMALITY - Continuous slow, generalized - Background slow IMPRESSION: This study is suggestive of mild to moderate diffuse encephalopathy, nonspecific etiology. No seizures or epileptiform discharges were seen throughout the recording. Charlsie Quest   MR BRAIN W WO CONTRAST  Result Date: 05/06/2023 CLINICAL DATA:  Mental status change, unknown cause. EXAM: MRI HEAD WITHOUT AND WITH CONTRAST TECHNIQUE: Multiplanar, multiecho pulse sequences of the brain and surrounding structures were obtained without and with intravenous contrast. CONTRAST:  10mL GADAVIST GADOBUTROL 1 MMOL/ML IV SOLN COMPARISON:  MRI brain 07/28/2021.  Head CT 05/06/2023. FINDINGS: Brain: No acute infarct or hemorrhage. Unchanged encephalomalacia from prior infarct along the right temporoparietal junction. Encephalomalacia and hemosiderin staining from prior hemorrhage in the  right corona radiata. No hydrocephalus or extra-axial collection. No mass or abnormal enhancement. Vascular: Normal flow voids and vessel enhancement. Skull and upper cervical spine: Normal marrow signal and enhancement. Sinuses/Orbits: Unremarkable. Other: None. IMPRESSION: 1. No acute  intracranial abnormality or mass. 2. Unchanged encephalomalacia from prior infarct along the right temporoparietal junction and right corona radiata. Electronically Signed   By: Orvan Falconer M.D.   On: 05/06/2023 10:27   CT Head Wo Contrast  Result Date: 05/06/2023 CLINICAL DATA:  Delirium EXAM: CT HEAD WITHOUT CONTRAST TECHNIQUE: Contiguous axial images were obtained from the base of the skull through the vertex without intravenous contrast. RADIATION DOSE REDUCTION: This exam was performed according to the departmental dose-optimization program which includes automated exposure control, adjustment of the mA and/or kV according to patient size and/or use of iterative reconstruction technique. COMPARISON:  04/19/2023 FINDINGS: Brain: No evidence of acute infarction, hemorrhage, hydrocephalus, extra-axial collection or mass lesion/mass effect. Small chronic cortex infarcts seen along the right cerebral convexity. Generalized brain atrophy. Vascular: No hyperdense vessel or unexpected calcification. Skull: Normal. Negative for fracture or focal lesion. Sinuses/Orbits: No acute finding. IMPRESSION: 1. No acute finding. 2. Small chronic infarcts scattered in the right cerebral cortex. Electronically Signed   By: Tiburcio Pea M.D.   On: 05/06/2023 04:06   DG Chest Portable 1 View  Result Date: 05/06/2023 CLINICAL DATA:  Weakness EXAM: PORTABLE CHEST 1 VIEW COMPARISON:  04/03/2023 FINDINGS: Pulmonary insufflation is stable. Diffusely increased opacity of the right hemithorax likely relates to posteriorly layering small to moderate pleural fluid on this supine examination. Moderate left pleural effusion also noted. Superimposed left basilar atelectasis or infiltrate is present. No definite pneumothorax. Right upper extremity PICC line tip seen at the superior cavoatrial junction. Cardiac size within normal limits. Pulmonary vascularity is normal. No acute bone abnormality. IMPRESSION: 1. Small to moderate  bilateral pleural effusions. 2. Left basilar atelectasis or infiltrate. Electronically Signed   By: Helyn Numbers M.D.   On: 05/06/2023 03:26      Signature  -   Susa Raring M.D on 05/07/2023 at 9:51 AM   -  To page go to [email or web address]

## 2023-05-08 ENCOUNTER — Inpatient Hospital Stay: Payer: Self-pay | Admitting: Internal Medicine

## 2023-05-08 LAB — CBC WITH DIFFERENTIAL/PLATELET
Abs Immature Granulocytes: 0.03 10*3/uL (ref 0.00–0.07)
Basophils Absolute: 0 10*3/uL (ref 0.0–0.1)
Basophils Relative: 0 %
Eosinophils Absolute: 0 10*3/uL (ref 0.0–0.5)
Eosinophils Relative: 0 %
HCT: 27.3 % — ABNORMAL LOW (ref 39.0–52.0)
Hemoglobin: 8.2 g/dL — ABNORMAL LOW (ref 13.0–17.0)
Immature Granulocytes: 0 %
Lymphocytes Relative: 9 %
Lymphs Abs: 0.7 10*3/uL (ref 0.7–4.0)
MCH: 24.8 pg — ABNORMAL LOW (ref 26.0–34.0)
MCHC: 30 g/dL (ref 30.0–36.0)
MCV: 82.7 fL (ref 80.0–100.0)
Monocytes Absolute: 0.2 10*3/uL (ref 0.1–1.0)
Monocytes Relative: 3 %
Neutro Abs: 6.6 10*3/uL (ref 1.7–7.7)
Neutrophils Relative %: 88 %
Platelets: 191 10*3/uL (ref 150–400)
RBC: 3.3 MIL/uL — ABNORMAL LOW (ref 4.22–5.81)
RDW: 22.7 % — ABNORMAL HIGH (ref 11.5–15.5)
WBC: 7.5 10*3/uL (ref 4.0–10.5)
nRBC: 0 % (ref 0.0–0.2)

## 2023-05-08 LAB — MAGNESIUM: Magnesium: 1.8 mg/dL (ref 1.7–2.4)

## 2023-05-08 LAB — BASIC METABOLIC PANEL
Anion gap: 6 (ref 5–15)
BUN: 19 mg/dL (ref 8–23)
CO2: 22 mmol/L (ref 22–32)
Calcium: 7.9 mg/dL — ABNORMAL LOW (ref 8.9–10.3)
Chloride: 104 mmol/L (ref 98–111)
Creatinine, Ser: 0.91 mg/dL (ref 0.61–1.24)
GFR, Estimated: >60 mL/min (ref >60)
Glucose, Bld: 192 mg/dL — ABNORMAL HIGH (ref 70–99)
Potassium: 4.1 mmol/L (ref 3.5–5.1)
Sodium: 132 mmol/L — ABNORMAL LOW (ref 135–145)

## 2023-05-08 LAB — C-REACTIVE PROTEIN: CRP: 4 mg/dL — ABNORMAL HIGH (ref <1.0)

## 2023-05-08 LAB — GLUCOSE, CAPILLARY
Glucose-Capillary: 150 mg/dL — ABNORMAL HIGH (ref 70–99)
Glucose-Capillary: 198 mg/dL — ABNORMAL HIGH (ref 70–99)
Glucose-Capillary: 162 mg/dL — ABNORMAL HIGH (ref 70–99)
Glucose-Capillary: 158 mg/dL — ABNORMAL HIGH (ref 70–99)

## 2023-05-08 LAB — BRAIN NATRIURETIC PEPTIDE: B Natriuretic Peptide: 2934.6 pg/mL — ABNORMAL HIGH (ref 0.0–100.0)

## 2023-05-08 LAB — PROCALCITONIN: Procalcitonin: <0.1 ng/mL

## 2023-05-08 MED ORDER — FUROSEMIDE 10 MG/ML IJ SOLN
40.0000 mg | Freq: Two times a day (BID) | INTRAMUSCULAR | Status: AC
  Administered 2023-05-08 – 2023-05-09 (×2): 40 mg via INTRAVENOUS
  Filled 2023-05-08 (×2): qty 4

## 2023-05-08 MED ORDER — ALBUMIN HUMAN 25 % IV SOLN
50.0000 g | Freq: Four times a day (QID) | INTRAVENOUS | Status: AC
  Administered 2023-05-08 (×3): 50 g via INTRAVENOUS
  Filled 2023-05-08 (×2): qty 200

## 2023-05-08 NOTE — Plan of Care (Signed)

## 2023-05-08 NOTE — Progress Notes (Signed)
Pt refused dressing change of the leg for now.Wants to take rest.

## 2023-05-08 NOTE — TOC Progression Note (Signed)
Transition of Care [facility]) - Progression Note    Patient Details  Name: Peter Yang MRN: [id number] Date of Birth: 01-18-1956  [facility] CM/SW Contact  Mearl Latin, LCSW Phone Number: 05/08/2023, 3:26 PM  Clinical Narrative:    CSW met with patient to discuss needed documents for a Medicaid application at [name]. Patient stated he cannot remember what documents are needed but that he is not sure that his friend would be able to find them. He reported if he could find "the lady's number that helped me with my Medicare" then she might could help but he cannot find it in his phone. CSW stressed that [name] needs the information for payment. CSW will reach out to Financial Counseling to see if there is anything they can assist with.  Patient requested another Ensure and pain medication for his groin sutures. CSW made RN aware and provided Ensure after checking with Consulting civil engineer.    Expected Discharge Plan: Skilled Nursing Facility Barriers to Discharge: Continued Medical Work up  Expected Discharge Plan and Services In-house Referral: Clinical Social Work   Post Acute Care Choice: Skilled Nursing Facility Living arrangements for the past 2 months: Skilled Nursing Facility                                       Social Determinants of Health (SDOH) Interventions SDOH Screenings   Food Insecurity: No Food Insecurity (04/02/2023)  Housing: Low Risk  (04/02/2023)  Transportation Needs: No Transportation Needs (04/02/2023)  Utilities: Not At Risk (04/02/2023)  Financial Resource Strain: Low Risk  (08/13/2018)   Received from [facility] visits prior to 12/21/2022.  Tobacco Use: Low Risk  (05/06/2023)    Readmission Risk Interventions    05/07/2023    4:42 PM 10/24/2022   11:21 AM 09/02/2022   10:36 AM  Readmission Risk Prevention Plan  Transportation Screening Complete Complete Complete  Medication Review Oceanographer)  Complete  Complete  PCP or Specialist appointment within 3-5 days of discharge Complete Complete Complete  HRI or Home Care Consult Complete Complete Complete  SW Recovery Care/Counseling Consult Complete Complete Complete  Palliative Care Screening Not Applicable Not Applicable Not Applicable  Skilled Nursing Facility Complete Complete Complete

## 2023-05-08 NOTE — Progress Notes (Signed)
Subjective: 7/18: Patient resting on rounds this morning.  Ongoing hematuria.  No specific complaints.  Objective: Vital signs in last 24 hours: Temp:  [98.1 F (36.7 C)-99.3 F (37.4 C)] 98.1 F (36.7 C) (07/18 0859) Pulse Rate:  [72-85] 72 (07/18 0859) Resp:  [10-22] 11 (07/18 0800) BP: (95-116)/(61-82) 98/65 (07/18 0859) SpO2:  [93 %-100 %] 100 % (07/18 0859) Weight:  [120 kg] 120 kg (07/18 0500)  Intake/Output from previous day: 07/17 0701 - 07/18 0700 In: 886.5 [P.O.:480; I.V.:30; IV Piggyback:376.5] Out: 2300 [Urine:2300]  Intake/Output this shift: No intake/output data recorded.  Physical Exam:  General: Alert and oriented CV: No cyanosis Lungs: equal chest rise Abdomen: Soft, NTND, no rebound or guarding Skin: Significant pelvic anasarca and tinea cruris Gu: Foley catheter in place draining reddish-brown urine.  Likely representing old blood.  Lab Results: Recent Labs    05/06/23 0830 05/07/23 0625 05/08/23 0351  HGB 8.5* 9.2* 8.2*  HCT 28.7* 29.6* 27.3*   BMET Recent Labs    05/07/23 0625 05/08/23 0351  NA 135 132*  K 3.9 4.1  CL 104 104  CO2 23 22  GLUCOSE 134* 192*  BUN 17 19  CREATININE 0.95 0.91  CALCIUM 8.2* 7.9*     Studies/Results: EEG adult  Result Date: 05/06/2023 Charlsie Quest, MD     05/06/2023  1:50 PM Patient Name: Renato Spellman MRN: [id number] Epilepsy Attending: Charlsie Quest Referring Physician/Provider: Merlene Laughter, DO Date: 05/06/2023 Duration: 23.22 mins Patient history: 67yo M witnessed to be unresponsive at his nursing home with shaking activity that self resolved without any focal deficits noted, and was confused/likely postictal afterwards with gradual recovery to baseline. EEG to evaluate for seizure. Level of alertness: Awake, asleep AEDs during EEG study: None Technical aspects: This EEG study was done with scalp electrodes positioned according to the 10-20 International system of electrode placement.  Electrical activity was reviewed with band pass filter of 1-70Hz , sensitivity of 7 uV/mm, display speed of 53mm/sec with a 60Hz  notched filter applied as appropriate. EEG data were recorded continuously and digitally stored.  Video monitoring was available and reviewed as appropriate. Description: The posterior dominant rhythm consists of 7 Hz activity of moderate voltage (25-35 uV) seen predominantly in posterior head regions, symmetric and reactive to eye opening and eye closing. Sleep was characterized by vertex waves, sleep spindles (12 to 14 Hz), maximal frontocentral region. EEG showed continuous generalized 5 to 7 Hz theta slowing. Physiologic photic driving was seen during photic stimulation. Hyperventilation was not performed.   ABNORMALITY - Continuous slow, generalized - Background slow IMPRESSION: This study is suggestive of mild to moderate diffuse encephalopathy, nonspecific etiology. No seizures or epileptiform discharges were seen throughout the recording. Priyanka Annabelle Harman    Assessment/Plan: #Fourniers gangrene #tinea cruis   Quarter-inch Penrose drain and suture found in bed on arrival. Stop SGLT2 Sutures are still in place on both scrotal incisions.  I will leave them for now as the patient's anasarca has returned and these have a high risk for dehiscence.  There is an area on the left lower incision that I believe may be opened already. This will continue to weep.  Nursing to change ABD pads often. Will notify his primary surgeon, Dr. Marlou Porch.    #Urinary Retention  Foley catheter is due for exchange.  Discussed with primary team.  His pelvic anasarca is significant.  They will attempt to help [place] temporize this in the short-term with Lasix and albumin.  He was relatively responsive to this in the past. 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  I would not be able to access the glans without causing trauma at this time.  We will monitor his progress over the next  couple of days and exchanges soon as possible.   #ESBL UTI Daptomycin and Merrem per ID   LOS: 1 day   Elmon Kirschner, NP [facility] Pager: [phone]  05/08/2023, 10:41 AM

## 2023-05-08 NOTE — Progress Notes (Signed)
PROGRESS NOTE                                                                                                                                                                                                             Patient Demographics:    Peter Yang, is a 67 y.o. male, DOB - 01-Aug-1956, VWU:[id number]  Outpatient Primary MD for the patient is Georgann Housekeeper, MD    LOS - 1  Admit date - 05/06/2023    Chief Complaint  Patient presents with   Altered Mental Status       Brief Narrative (HPI from H&P)    67 y.o. male with medical history significant for but not limited to CAD, diabetic polyneuropathy with diabetes mellitus type 2, history of NSTEMI, history of stent placement, hyperlipidemia, BPH, history of large bowel perforation, history of PAF, ischemic cardiomyopathy, history of TIA and stroke, history of diabetic foot ulcer, history of BKA on the left as well as other comorbidities including systolic and diastolic combined CHF and LVEF of 35% and grade 2 diastolic dysfunction who was recently hospitalized on 04/01/2023 until 04/18/2023 from his skilled nursing facility urinary retention, Foley catheter placement and subsequent development and finding of Fournier's gangrene.  He was seen by urology underwent surgical debridement twice first on 04/02/2023 and then again on 04/09/2023 by Dr. Marlou Porch, he was placed on IV daptomycin and ertapenem and discharged back to nursing home.  He now comes back with altered mental status.   Subjective:   Patient in bed, appears comfortable, denies any headache, no fever, no chest pain or pressure, no shortness of breath , no abdominal pain. No new focal weakness.   Assessment  & Plan :   Metabolic encephalopathy with hypotension and some generalized shaking likely due to hypotension and dehydration in the setting of chronic adrenal insufficiency.  Do not think this was a seizure, he has  history of adrenal insufficiency is chronically on steroids and was clinically dehydrated and hypotensive, MRI brain and EEG nonacute, increased home dose steroid, hydrated with IV fluids, PT OT and monitor.  This was discussed by neurologist upon admission.  Blood pressure and encephalopathy much improved.  Recent history of Fournier's gangrene.  Debridement twice -  first on 04/02/2023 and then again on 04/09/2023 by Dr. Marlou Porch, he was placed on IV daptomycin and ertapenem  and discharged back to nursing home, continue present antibiotics, has Foley catheter and right arm PICC line, urology to see.  Do not think there is an acute change.  Case discussed with urology on 05/08/2023 they will look into changing Foley catheter.  History of urinary retention.  Foley catheter as above.  Chronic adrenal insufficiency with hypotension, on chronic steroids.  Increase steroids, hydrate and monitor.  Moderate to severe protein calorie malnutrition.  Protein supplements.  Has anasarca chronically.  Anemia of chronic disease.  Stable.  Dehydration, hypoperfusion induced lactic acidosis, hypomagnesemia and hyponatremia.  Resolved after IV fluids.  No signs of sepsis.  Ignacia replaced.  Chronic combined systolic and diastolic CHF.  EF 35%.  Blood pressure too low for beta-blocker or ACE inhibitor, currently compensated.  Monitor.    Hypertension.  Currently blood pressure is soft.  Midodrine, higher than home dose steroids and IV fluids.  Paroxysmal atrial fibrillation.  Not on rate controlling agents, on Eliquis continue.  [place] vas 2 score of greater than 3.  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  Supportive care.  PT OT.  DM type II.  On long-acting insulin along with sliding scale.  Monitor.  Lab Results  Component Value Date   HGBA1C 7.0 (H) 04/02/2023    CBG (last 3)  Recent Labs    05/07/23 1734 05/07/23 2336 05/08/23 0750  GLUCAP 167* 158* 150*          Condition - Extremely Guarded  Family Communication  :  [name] [phone]  - 05/07/23  Code Status :  DNR  Consults  :  Neuro, Urology  PUD Prophylaxis :     Procedures  :     EEG -  This study is suggestive of mild to moderate diffuse encephalopathy, nonspecific etiology. No seizures or epileptiform discharges were seen throughout the recording.  MRI  1. No acute intracranial abnormality or mass. 2. Unchanged encephalomalacia from prior infarct along the right temporoparietal junction and right corona radiata.      Disposition Plan  :    Status is: Observation  DVT Prophylaxis  :    SCDs Start: 05/06/23 1531 apixaban (ELIQUIS) tablet 5 mg     Lab Results  Component Value Date   PLT 191 05/08/2023    Diet :  Diet Order             Diet heart healthy/carb modified Room service appropriate? Yes; Fluid consistency: Thin  Diet effective now                    Inpatient Medications  Scheduled Meds:  (feeding supplement) PROSource Plus  30 mL Oral BID BM   apixaban  5 mg Oral BID   atorvastatin  80 mg Oral Daily   B-complex with vitamin C  1 tablet Oral Daily   Chlorhexidine Gluconate Cloth  6 each Topical Daily   docusate sodium  100 mg Oral BID   feeding supplement  237 mL Oral TID BM   ferrous sulfate  325 mg Oral Q breakfast   finasteride  5 mg Oral Daily   gabapentin  300 mg Oral QHS   hydrocortisone  20 mg Oral BID   insulin aspart  0-15 Units Subcutaneous TID WC   insulin aspart  0-5 Units Subcutaneous QHS   insulin glargine-yfgn  20 Units Subcutaneous QHS   melatonin  5 mg Oral QHS   midodrine  10 mg Oral TID with  meals   oxyCODONE  15 mg Oral Q12H   pantoprazole  40 mg Oral Daily   saccharomyces boulardii  250 mg Oral BID   senna-docusate  1 tablet Oral BID   sodium chloride flush  10-40 mL Intracatheter Q12H   tamsulosin  0.4 mg Oral Daily   zinc sulfate  220 mg Oral Daily   Continuous Infusions:  DAPTOmycin (CUBICIN) 800 mg in sodium  chloride 0.9 % IVPB Stopped (05/07/23 1612)   meropenem (MERREM) IV 200 mL/hr at 05/08/23 0545   PRN Meds:.albuterol, bisacodyl, hydrALAZINE, HYDROmorphone (DILAUDID) injection, LORazepam, oxyCODONE    Objective:   Vitals:   05/08/23 0500 05/08/23 0600 05/08/23 0800 05/08/23 0859  BP:   96/64 98/65  Pulse:   75 72  Resp: 18 18 11    Temp:    98.1 F (36.7 C)  TempSrc:    Oral  SpO2:    100%  Weight: 120 kg     Height:        Wt Readings from Last 3 Encounters:  05/08/23 120 kg  04/19/23 109.3 kg  04/16/23 110.7 kg     Intake/Output Summary (Last 24 hours) at 05/08/2023 1002 Last data filed at 05/08/2023 0545 Gross per 24 hour  Intake 626.5 ml  Output 2300 ml  Net -1673.5 ml     Physical Exam  Awake Alert, No new F.N deficits, Normal affect [place].AT,PERRAL Supple Neck, No JVD,   Symmetrical Chest wall movement, Good air movement bilaterally, CTAB RRR,No Gallops,Rubs or new Murmurs,  +ve B.Sounds, Abd Soft, No tenderness,   3+ anasarca  Scrotumlooks stable, sutures in place, foley , L BKA, right arm PICC line  RN pressure injury documentation: Pressure Injury 08/03/22 Buttocks Mid Stage 2 -  Partial thickness loss of dermis presenting as a shallow open injury with a red, pink wound bed without slough. area of red blanchable and non blanchable, and areas with skin sloughed off (Active)  08/03/22 1513  Location: Buttocks  Location Orientation: Mid  Staging: Stage 2 -  Partial thickness loss of dermis presenting as a shallow open injury with a red, pink wound bed without slough.  Wound Description (Comments): area of red blanchable and non blanchable, and areas with skin sloughed off  Present on Admission: Yes  Dressing Type Foam - Lift dressing to assess site every shift 04/18/23 1210      Data Review:    Recent Labs  Lab 05/06/23 0058 05/06/23 0830 05/07/23 0625 05/08/23 0351  WBC 9.2 7.8 10.6* 7.5  HGB 9.1* 8.5* 9.2* 8.2*  HCT 30.7* 28.7* 29.6* 27.3*   PLT 203 206 214 191  MCV 81.2 81.1 81.3 82.7  MCH 24.1* 24.0* 25.3* 24.8*  MCHC 29.6* 29.6* 31.1 30.0  RDW 22.3* 22.5* 22.6* 22.7*  LYMPHSABS  --  1.4 0.3* 0.7  MONOABS  --  0.8 0.6 0.2  EOSABS  --  0.5 0.5 0.0  BASOSABS  --  0.1 0.1 0.0    Recent Labs  Lab 05/06/23 0058 05/06/23 0100 05/06/23 0351 05/06/23 0830 05/07/23 0625 05/08/23 0351  NA 133*  --   --  133* 135 132*  K 4.1  --   --  3.8 3.9 4.1  CL 104  --   --  104 104 104  CO2 19*  --   --  22 23 22   ANIONGAP 10  --   --  7 8 6   GLUCOSE 184*  --   --  99 134* 192*  BUN  20  --   --  17 17 19   CREATININE 1.10  --   --  1.01 0.95 0.91  AST 26  --   --  25 26  --   ALT 16  --   --  16 16  --   ALKPHOS 94  --   --  82 85  --   BILITOT 0.9  --   --  0.8 0.8  --   ALBUMIN 2.4*  --   --  2.3* 2.1*  --   CRP  --   --   --   --  2.6* 4.0*  PROCALCITON  --   --   --   --  <0.10 <0.10  LATICACIDVEN  --  2.9* 1.4  --   --   --   TSH  --   --   --   --  9.178*  --   BNP  --   --   --   --   --  2,934.6*  MG 1.7  --   --  1.6* 2.0 1.8  CALCIUM 8.3*  --   --  8.1* 8.2* 7.9*      Recent Labs  Lab 05/06/23 0058 05/06/23 0100 05/06/23 0351 05/06/23 0830 05/07/23 0625 05/08/23 0351  CRP  --   --   --   --  2.6* 4.0*  PROCALCITON  --   --   --   --  <0.10 <0.10  LATICACIDVEN  --  2.9* 1.4  --   --   --   TSH  --   --   --   --  9.178*  --   BNP  --   --   --   --   --  2,934.6*  MG 1.7  --   --  1.6* 2.0 1.8  CALCIUM 8.3*  --   --  8.1* 8.2* 7.9*   Lab Results  Component Value Date   HGBA1C 7.0 (H) 04/02/2023    Recent Labs    05/07/23 0625  TSH 9.178*     Radiology Reports EEG adult  Result Date: 05/06/2023 Charlsie Quest, MD     05/06/2023  1:50 PM Patient Name: Peter Yang MRN: [id number] Epilepsy Attending: Charlsie Quest Referring Physician/Provider: Merlene Laughter, DO Date: 05/06/2023 Duration: 23.22 mins Patient history: 67yo M witnessed to be unresponsive at his nursing home with shaking  activity that self resolved without any focal deficits noted, and was confused/likely postictal afterwards with gradual recovery to baseline. EEG to evaluate for seizure. Level of alertness: Awake, asleep AEDs during EEG study: None Technical aspects: This EEG study was done with scalp electrodes positioned according to the 10-20 International system of electrode placement. Electrical activity was reviewed with band pass filter of 1-70Hz , sensitivity of 7 uV/mm, display speed of 62mm/sec with a 60Hz  notched filter applied as appropriate. EEG data were recorded continuously and digitally stored.  Video monitoring was available and reviewed as appropriate. Description: The posterior dominant rhythm consists of 7 Hz activity of moderate voltage (25-35 uV) seen predominantly in posterior head regions, symmetric and reactive to eye opening and eye closing. Sleep was characterized by vertex waves, sleep spindles (12 to 14 Hz), maximal frontocentral region. EEG showed continuous generalized 5 to 7 Hz theta slowing. Physiologic photic driving was seen during photic stimulation. Hyperventilation was not performed.   ABNORMALITY - Continuous slow, generalized - Background slow IMPRESSION: This study is suggestive of mild to moderate  diffuse encephalopathy, nonspecific etiology. No seizures or epileptiform discharges were seen throughout the recording. Charlsie Quest   MR BRAIN W WO CONTRAST  Result Date: 05/06/2023 CLINICAL DATA:  Mental status change, unknown cause. EXAM: MRI HEAD WITHOUT AND WITH CONTRAST TECHNIQUE: Multiplanar, multiecho pulse sequences of the brain and surrounding structures were obtained without and with intravenous contrast. CONTRAST:  10mL GADAVIST GADOBUTROL 1 MMOL/ML IV SOLN COMPARISON:  MRI brain 07/28/2021.  Head CT 05/06/2023. FINDINGS: Brain: No acute infarct or hemorrhage. Unchanged encephalomalacia from prior infarct along the right temporoparietal junction. Encephalomalacia and  hemosiderin staining from prior hemorrhage in the right corona radiata. No hydrocephalus or extra-axial collection. No mass or abnormal enhancement. Vascular: Normal flow voids and vessel enhancement. Skull and upper cervical spine: Normal marrow signal and enhancement. Sinuses/Orbits: Unremarkable. Other: None. IMPRESSION: 1. No acute intracranial abnormality or mass. 2. Unchanged encephalomalacia from prior infarct along the right temporoparietal junction and right corona radiata. Electronically Signed   By: Orvan Falconer M.D.   On: 05/06/2023 10:27   CT Head Wo Contrast  Result Date: 05/06/2023 CLINICAL DATA:  Delirium EXAM: CT HEAD WITHOUT CONTRAST TECHNIQUE: Contiguous axial images were obtained from the base of the skull through the vertex without intravenous contrast. RADIATION DOSE REDUCTION: This exam was performed according to the departmental dose-optimization program which includes automated exposure control, adjustment of the mA and/or kV according to patient size and/or use of iterative reconstruction technique. COMPARISON:  04/19/2023 FINDINGS: Brain: No evidence of acute infarction, hemorrhage, hydrocephalus, extra-axial collection or mass lesion/mass effect. Small chronic cortex infarcts seen along the right cerebral convexity. Generalized brain atrophy. Vascular: No hyperdense vessel or unexpected calcification. Skull: Normal. Negative for fracture or focal lesion. Sinuses/Orbits: No acute finding. IMPRESSION: 1. No acute finding. 2. Small chronic infarcts scattered in the right cerebral cortex. Electronically Signed   By: Tiburcio Pea M.D.   On: 05/06/2023 04:06   DG Chest Portable 1 View  Result Date: 05/06/2023 CLINICAL DATA:  Weakness EXAM: PORTABLE CHEST 1 VIEW COMPARISON:  04/03/2023 FINDINGS: Pulmonary insufflation is stable. Diffusely increased opacity of the right hemithorax likely relates to posteriorly layering small to moderate pleural fluid on this supine examination.  Moderate left pleural effusion also noted. Superimposed left basilar atelectasis or infiltrate is present. No definite pneumothorax. Right upper extremity PICC line tip seen at the superior cavoatrial junction. Cardiac size within normal limits. Pulmonary vascularity is normal. No acute bone abnormality. IMPRESSION: 1. Small to moderate bilateral pleural effusions. 2. Left basilar atelectasis or infiltrate. Electronically Signed   By: Helyn Numbers M.D.   On: 05/06/2023 03:26      Signature  -   Susa Raring M.D on 05/08/2023 at 10:02 AM   -  To page go to [email or web address]

## 2023-05-09 LAB — BASIC METABOLIC PANEL
Anion gap: 9 (ref 5–15)
BUN: 26 mg/dL — ABNORMAL HIGH (ref 8–23)
CO2: 21 mmol/L — ABNORMAL LOW (ref 22–32)
Calcium: 8.3 mg/dL — ABNORMAL LOW (ref 8.9–10.3)
Chloride: 104 mmol/L (ref 98–111)
Creatinine, Ser: 1.15 mg/dL (ref 0.61–1.24)
GFR, Estimated: >60 mL/min (ref >60)
Glucose, Bld: 191 mg/dL — ABNORMAL HIGH (ref 70–99)
Potassium: 3.9 mmol/L (ref 3.5–5.1)
Sodium: 134 mmol/L — ABNORMAL LOW (ref 135–145)

## 2023-05-09 LAB — PROCALCITONIN: Procalcitonin: <0.1 ng/mL

## 2023-05-09 LAB — CBC WITH DIFFERENTIAL/PLATELET
Abs Immature Granulocytes: 0 10*3/uL (ref 0.00–0.07)
Basophils Absolute: 0 10*3/uL (ref 0.0–0.1)
Basophils Relative: 0 %
Eosinophils Absolute: 0 10*3/uL (ref 0.0–0.5)
Eosinophils Relative: 0 %
HCT: 25.7 % — ABNORMAL LOW (ref 39.0–52.0)
Hemoglobin: 7.6 g/dL — ABNORMAL LOW (ref 13.0–17.0)
Lymphocytes Relative: 9 %
Lymphs Abs: 0.8 10*3/uL (ref 0.7–4.0)
MCH: 24.2 pg — ABNORMAL LOW (ref 26.0–34.0)
MCHC: 29.6 g/dL — ABNORMAL LOW (ref 30.0–36.0)
MCV: 81.8 fL (ref 80.0–100.0)
Monocytes Absolute: 0.3 10*3/uL (ref 0.1–1.0)
Monocytes Relative: 3 %
Neutro Abs: 7.5 10*3/uL (ref 1.7–7.7)
Neutrophils Relative %: 88 %
Platelets: 139 10*3/uL — ABNORMAL LOW (ref 150–400)
RBC: 3.14 MIL/uL — ABNORMAL LOW (ref 4.22–5.81)
RDW: 22.6 % — ABNORMAL HIGH (ref 11.5–15.5)
WBC: 8.5 10*3/uL (ref 4.0–10.5)
nRBC: 0 % (ref 0.0–0.2)
nRBC: 0 /100 WBC

## 2023-05-09 LAB — TYPE AND SCREEN

## 2023-05-09 LAB — C-REACTIVE PROTEIN: CRP: 1.1 mg/dL — ABNORMAL HIGH (ref <1.0)

## 2023-05-09 LAB — GLUCOSE, CAPILLARY
Glucose-Capillary: 158 mg/dL — ABNORMAL HIGH (ref 70–99)
Glucose-Capillary: 201 mg/dL — ABNORMAL HIGH (ref 70–99)
Glucose-Capillary: 146 mg/dL — ABNORMAL HIGH (ref 70–99)
Glucose-Capillary: 181 mg/dL — ABNORMAL HIGH (ref 70–99)

## 2023-05-09 LAB — BRAIN NATRIURETIC PEPTIDE: B Natriuretic Peptide: 3947.5 pg/mL — ABNORMAL HIGH (ref 0.0–100.0)

## 2023-05-09 LAB — MAGNESIUM: Magnesium: 1.8 mg/dL (ref 1.7–2.4)

## 2023-05-09 MED ORDER — OXYCODONE HCL 5 MG PO TABS
10.0000 mg | ORAL_TABLET | Freq: Four times a day (QID) | ORAL | Status: DC | PRN
  Administered 2023-05-09 – 2023-05-12 (×5): 10 mg via ORAL
  Filled 2023-05-09 (×5): qty 2

## 2023-05-09 MED ORDER — NYSTATIN 100000 UNIT/GM EX POWD
Freq: Three times a day (TID) | CUTANEOUS | Status: DC
  Administered 2023-05-12: 1 via TOPICAL
  Filled 2023-05-09 (×2): qty 15

## 2023-05-09 MED ORDER — POTASSIUM CHLORIDE CRYS ER 20 MEQ PO TBCR
EXTENDED_RELEASE_TABLET | ORAL | Status: AC
  Filled 2023-05-09: qty 2

## 2023-05-09 MED ORDER — FUROSEMIDE 10 MG/ML IJ SOLN
INTRAMUSCULAR | Status: AC
  Filled 2023-05-09: qty 4

## 2023-05-09 MED ORDER — ALTEPLASE 2 MG IJ SOLR
2.0000 mg | Freq: Once | INTRAMUSCULAR | Status: AC
  Administered 2023-05-09: 2 mg

## 2023-05-09 MED ORDER — FUROSEMIDE 10 MG/ML IJ SOLN
40.0000 mg | Freq: Two times a day (BID) | INTRAMUSCULAR | Status: AC
  Administered 2023-05-09 (×2): 40 mg via INTRAVENOUS
  Filled 2023-05-09: qty 4

## 2023-05-09 MED ORDER — ALBUMIN HUMAN 25 % IV SOLN
INTRAVENOUS | Status: AC
  Filled 2023-05-09: qty 200

## 2023-05-09 MED ORDER — ALBUMIN HUMAN 25 % IV SOLN
50.0000 g | Freq: Two times a day (BID) | INTRAVENOUS | Status: AC
  Administered 2023-05-09 (×2): 50 g via INTRAVENOUS
  Filled 2023-05-09: qty 200

## 2023-05-09 MED ORDER — POTASSIUM CHLORIDE CRYS ER 20 MEQ PO TBCR
40.0000 meq | EXTENDED_RELEASE_TABLET | Freq: Once | ORAL | Status: AC
  Administered 2023-05-09: 40 meq via ORAL

## 2023-05-09 MED ORDER — HEPARIN SODIUM (PORCINE) 5000 UNIT/ML IJ SOLN
5000.0000 [IU] | Freq: Three times a day (TID) | INTRAMUSCULAR | Status: DC
  Administered 2023-05-10 – 2023-05-13 (×9): 5000 [IU] via SUBCUTANEOUS
  Filled 2023-05-09 (×9): qty 1

## 2023-05-09 NOTE — Progress Notes (Signed)
     Subjective: 7/18: Patient resting on rounds this morning.  Ongoing hematuria.  No specific complaints.  Objective: Vital signs in last 24 hours: Temp:  [98 F (36.7 C)-98.2 F (36.8 C)] 98.2 F (36.8 C) (07/19 0430) Pulse Rate:  [67-82] 82 (07/19 0430) Resp:  [9-16] 14 (07/19 0430) BP: (88-114)/(60-72) 107/72 (07/19 0430) SpO2:  [96 %-98 %] 98 % (07/19 0400)  Intake/Output from previous day: 07/18 0701 - 07/19 0700 In: 1631.7 [P.O.:840; IV Piggyback:791.7] Out: 1000 [Urine:1000]  Intake/Output this shift: No intake/output data recorded.  Physical Exam:  General: Alert and oriented CV: No cyanosis Lungs: equal chest rise Abdomen: Soft, NTND, no rebound or guarding Skin: Significant pelvic anasarca and tinea cruris Gu: Foley catheter in place draining reddish-brown urine.  Likely representing old blood.  Lab Results: Recent Labs    05/07/23 0625 05/08/23 0351 05/09/23 0220  HGB 9.2* 8.2* 7.6*  HCT 29.6* 27.3* 25.7*   BMET Recent Labs    05/07/23 0625 05/08/23 0351  NA 135 132*  K 3.9 4.1  CL 104 104  CO2 23 22  GLUCOSE 134* 192*  BUN 17 19  CREATININE 0.95 0.91  CALCIUM 8.2* 7.9*     Studies/Results: No results found.  Assessment/Plan: #Fourniers gangrene #tinea cruis   Quarter-inch Penrose drain and suture found in bed on arrival. Stop SGLT2 Sutures are still in place on both scrotal incisions.  I will leave them for now as the patient's anasarca has returned and these have a high risk for dehiscence.  Pitting edema of scrotum. This will continue to weep.  Nursing to change ABD pads often. Discussed his primary surgeon, Dr. Marlou Porch. Recommend antifungal powder, elevating scrotum, and diligently keeping dry. His groin is soaking wet at all times and he will not heal unless this can be corrected. Discussed with nursing.     #Urinary Retention  Foley catheter is due for exchange.  Discussed with primary team.  His pelvic anasarca is  significant.  They will attempt to help [place] temporize this in the short-term with Lasix and albumin.  He was relatively responsive to this in the past. 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  I would not be able to access the glans without causing trauma at this time.  We will monitor his progress over the next couple of days and exchanges soon as possible. Some improvement today. Will monitor over the weekend.    #ESBL UTI Daptomycin and Merrem per ID   LOS: 2 days   Elmon Kirschner, NP [facility] Pager: [phone]  05/09/2023, 11:28 AM

## 2023-05-09 NOTE — Plan of Care (Signed)

## 2023-05-09 NOTE — TOC Progression Note (Signed)
Transition of Care [facility]) - Progression Note    Patient Details  Name: Peter Yang MRN: [id number] Date of Birth: [birth year]  Transition [facility] Care [facility] CM/SW Contact  Baldemar Lenis, [place] Phone Number: 05/09/2023, 10:12 AM  Clinical Narrative:   CSW updated by MD that patient not medically stable to return to SNF today. CSW updated [facility]. CSW to follow.    Expected Discharge Plan: Skilled Nursing Facility Barriers to Discharge: Continued Medical Work up  Expected Discharge Plan and Services In-house Referral: Clinical Social Work   Post Acute Care Choice: Skilled Nursing Facility Living arrangements for the past 2 months: Skilled Nursing Facility                                       Social Determinants of Health (SDOH) Interventions SDOH Screenings   Food Insecurity: No Food Insecurity (05/09/2023)  Housing: Low Risk  (05/09/2023)  Transportation Needs: No Transportation Needs (05/09/2023)  Utilities: Not At Risk (05/09/2023)  Financial Resource Strain: Low Risk  (08/13/2018)   Received from [facility] visits prior to 12/21/2022.  Tobacco Use: Low Risk  (05/06/2023)    Readmission Risk Interventions    05/07/2023    4:42 PM 10/24/2022   11:21 AM 09/02/2022   10:36 AM  Readmission Risk Prevention Plan  Transportation Screening Complete Complete Complete  Medication Review [employer])  Complete Complete  PCP or Specialist appointment within 3-5 days of discharge Complete Complete Complete  HRI or Home Care Consult Complete Complete Complete  SW Recovery Care/Counseling Consult Complete Complete Complete  Palliative Care Screening Not Applicable Not Applicable Not Applicable  Skilled Nursing Facility Complete Complete Complete

## 2023-05-09 NOTE — Progress Notes (Signed)
PROGRESS NOTE                                                                                                                                                                                                             Patient Demographics:    Peter Yang, is a 67 y.o. male, DOB - 1956/09/29, XLK:[id number]  Outpatient Primary MD for the patient is Patient, No Pcp Per    LOS - 2  Admit date - 05/06/2023    Chief Complaint  Patient presents with   Altered Mental Status       Brief Narrative (HPI from H&P)    67 y.o. male with medical history significant for but not limited to CAD, diabetic polyneuropathy with diabetes mellitus type 2, history of NSTEMI, history of stent placement, hyperlipidemia, BPH, history of large bowel perforation, history of PAF, ischemic cardiomyopathy, history of TIA and stroke, history of diabetic foot ulcer, history of BKA on the left as well as other comorbidities including systolic and diastolic combined CHF and LVEF of 35% and grade 2 diastolic dysfunction who was recently hospitalized on 04/01/2023 until 04/18/2023 from his skilled nursing facility urinary retention, Foley catheter placement and subsequent development and finding of Fournier's gangrene.  He was seen by urology underwent surgical debridement twice first on 04/02/2023 and then again on 04/09/2023 by Dr. Marlou Porch, he was placed on IV daptomycin and ertapenem and discharged back to nursing home.  He now comes back with altered mental status.   Subjective:   Patient in bed, appears comfortable, denies any headache, no fever, no chest pain or pressure, no shortness of breath , no abdominal pain. No new focal weakness.   Assessment  & Plan :   Metabolic encephalopathy with hypotension and some generalized shaking likely due to hypotension and dehydration in the setting of chronic adrenal insufficiency.  Do not think this was a seizure, he has  history of adrenal insufficiency is chronically on steroids and was clinically dehydrated and hypotensive, MRI brain and EEG nonacute, increased home dose steroid, hydrated with IV fluids, PT OT and monitor.  This was discussed by neurologist upon admission.  Blood pressure and encephalopathy much improved.  Recent history of Fournier's gangrene.  Debridement twice -  first on 04/02/2023 and then again on 04/09/2023 by Dr. Marlou Porch, he was placed on IV daptomycin and  ertapenem and discharged back to nursing home, continue present antibiotics, has Foley catheter and right arm PICC line, urology to see.  Do not think there is an acute change.  Case discussed with urology on 05/08/2023 they will look into changing Foley catheter.  History of urinary retention.  Foley catheter as above.  Anemia of chronic disease, worse with ongoing Hematuria POA.  Hold Eliquis, type screen.  Paroxysmal atrial fibrillation.  Not on rate controlling agents, on Eliquis , see above.  [place] vas 2 score of greater than 3.  Chronic adrenal insufficiency with hypotension, on chronic steroids.  Increase steroids, hydrate and monitor.  Moderate to severe protein calorie malnutrition.  Protein supplements.  Has anasarca chronically, albumin + Lasix  Dehydration, hypoperfusion induced lactic acidosis, hypomagnesemia and hyponatremia.  Resolved after IV fluids.  No signs of sepsis.  Ignacia replaced.  Chronic combined systolic and diastolic CHF.  EF 35%.  Blood pressure too low for beta-blocker or ACE inhibitor, currently compensated.  Monitor.    Hypertension.  Currently blood pressure is soft.  Midodrine, higher than home dose steroids and IV fluids.  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  Supportive care.  PT OT.  DM type II.  On long-acting insulin along with sliding scale.  Monitor.  Lab Results  Component Value Date   HGBA1C 7.0 (H) 04/02/2023    CBG (last 3)  Recent Labs     05/08/23 1558 05/08/23 2110 05/09/23 0849  GLUCAP 162* 158* 158*         Condition - Extremely Guarded  Family Communication  :  [name] [phone]  - 05/07/23  Code Status :  DNR  Consults  :  Neuro, Urology  PUD Prophylaxis :     Procedures  :     EEG -  This study is suggestive of mild to moderate diffuse encephalopathy, nonspecific etiology. No seizures or epileptiform discharges were seen throughout the recording.  MRI  1. No acute intracranial abnormality or mass. 2. Unchanged encephalomalacia from prior infarct along the right temporoparietal junction and right corona radiata.      Disposition Plan  :    Status is: Observation  DVT Prophylaxis  :    heparin injection 5,000 Units Start: 05/10/23 0600 SCDs Start: 05/06/23 1531     Lab Results  Component Value Date   PLT 139 (L) 05/09/2023    Diet :  Diet Order             Diet heart healthy/carb modified Room service appropriate? Yes; Fluid consistency: Thin  Diet effective now                    Inpatient Medications  Scheduled Meds:  (feeding supplement) PROSource Plus  30 mL Oral BID BM   atorvastatin  80 mg Oral Daily   B-complex with vitamin C  1 tablet Oral Daily   Chlorhexidine Gluconate Cloth  6 each Topical Daily   docusate sodium  100 mg Oral BID   feeding supplement  237 mL Oral TID BM   ferrous sulfate  325 mg Oral Q breakfast   finasteride  5 mg Oral Daily   furosemide  40 mg Intravenous Q12H   gabapentin  300 mg Oral QHS   [START ON 05/10/2023] heparin injection (subcutaneous)  5,000 Units Subcutaneous Q8H   hydrocortisone  20 mg Oral BID   insulin aspart  0-15 Units Subcutaneous TID WC   insulin aspart  0-5 Units Subcutaneous QHS   insulin glargine-yfgn  20 Units Subcutaneous QHS   melatonin  5 mg Oral QHS   midodrine  10 mg Oral TID with meals   oxyCODONE  15 mg Oral Q12H   pantoprazole  40 mg Oral Daily   potassium chloride  40 mEq Oral Once   saccharomyces boulardii   250 mg Oral BID   senna-docusate  1 tablet Oral BID   sodium chloride flush  10-40 mL Intracatheter Q12H   tamsulosin  0.4 mg Oral Daily   zinc sulfate  220 mg Oral Daily   Continuous Infusions:  albumin human     DAPTOmycin (CUBICIN) 800 mg in sodium chloride 0.9 % IVPB Stopped (05/08/23 1702)   meropenem (MERREM) IV 1 g (05/09/23 0618)   PRN Meds:.albuterol, bisacodyl, hydrALAZINE, HYDROmorphone (DILAUDID) injection, LORazepam, oxyCODONE    Objective:   Vitals:   05/09/23 0100 05/09/23 0200 05/09/23 0400 05/09/23 0430  BP: 100/71 107/72 101/65 107/72  Pulse: 68 67 80 82  Resp: 16 13 16 14   Temp: 98 F (36.7 C) 98.2 F (36.8 C) 98.1 F (36.7 C) 98.2 F (36.8 C)  TempSrc: Oral Oral Oral Oral  SpO2: 96%  98%   Weight:      Height:        Wt Readings from Last 3 Encounters:  05/08/23 120 kg  04/19/23 109.3 kg  04/16/23 110.7 kg     Intake/Output Summary (Last 24 hours) at 05/09/2023 0851 Last data filed at 05/09/2023 0542 Gross per 24 hour  Intake 1271.68 ml  Output 1000 ml  Net 271.68 ml     Physical Exam  Awake Alert, No new F.N deficits, Normal affect [place].AT,PERRAL Supple Neck, No JVD,   Symmetrical Chest wall movement, Good air movement bilaterally, CTAB RRR,No Gallops,Rubs or new Murmurs,  +ve B.Sounds, Abd Soft, No tenderness,   3+ anasarca  Scrotum looks stable, sutures in place, foley , L BKA, right arm PICC line  RN pressure injury documentation: Pressure Injury 08/03/22 Buttocks Mid Stage 2 -  Partial thickness loss of dermis presenting as a shallow open injury with a red, pink wound bed without slough. area of red blanchable and non blanchable, and areas with skin sloughed off (Active)  08/03/22 1513  Location: Buttocks  Location Orientation: Mid  Staging: Stage 2 -  Partial thickness loss of dermis presenting as a shallow open injury with a red, pink wound bed without slough.  Wound Description (Comments): area of red blanchable and non  blanchable, and areas with skin sloughed off  Present on Admission: Yes  Dressing Type Foam - Lift dressing to assess site every shift 04/18/23 1210      Data Review:    Recent Labs  Lab 05/06/23 0058 05/06/23 0830 05/07/23 0625 05/08/23 0351 05/09/23 0220  WBC 9.2 7.8 10.6* 7.5 8.5  HGB 9.1* 8.5* 9.2* 8.2* 7.6*  HCT 30.7* 28.7* 29.6* 27.3* 25.7*  PLT 203 206 214 191 139*  MCV 81.2 81.1 81.3 82.7 81.8  MCH 24.1* 24.0* 25.3* 24.8* 24.2*  MCHC 29.6* 29.6* 31.1 30.0 29.6*  RDW 22.3* 22.5* 22.6* 22.7* 22.6*  LYMPHSABS  --  1.4 0.3* 0.7 0.8  MONOABS  --  0.8 0.6 0.2 0.3  EOSABS  --  0.5 0.5 0.0 0.0  BASOSABS  --  0.1 0.1 0.0 0.0    Recent Labs  Lab 05/06/23 0058 05/06/23 0100 05/06/23 0351 05/06/23 0830 05/07/23 0625 05/08/23 0351 05/09/23 0220  NA 133*  --   --  133* 135 132*  --   K 4.1  --   --  3.8 3.9 4.1  --   CL 104  --   --  104 104 104  --   CO2 19*  --   --  22 23 22   --   ANIONGAP 10  --   --  7 8 6   --   GLUCOSE 184*  --   --  99 134* 192*  --   BUN 20  --   --  17 17 19   --   CREATININE 1.10  --   --  1.01 0.95 0.91  --   AST 26  --   --  25 26  --   --   ALT 16  --   --  16 16  --   --   ALKPHOS 94  --   --  82 85  --   --   BILITOT 0.9  --   --  0.8 0.8  --   --   ALBUMIN 2.4*  --   --  2.3* 2.1*  --   --   CRP  --   --   --   --  2.6* 4.0*  --   PROCALCITON  --   --   --   --  <0.10 <0.10 <0.10  LATICACIDVEN  --  2.9* 1.4  --   --   --   --   TSH  --   --   --   --  9.178*  --   --   BNP  --   --   --   --   --  4,010.2*  --   MG 1.7  --   --  1.6* 2.0 1.8  --   CALCIUM 8.3*  --   --  8.1* 8.2* 7.9*  --       Recent Labs  Lab 05/06/23 0058 05/06/23 0100 05/06/23 0351 05/06/23 0830 05/07/23 0625 05/08/23 0351 05/09/23 0220  CRP  --   --   --   --  2.6* 4.0*  --   PROCALCITON  --   --   --   --  <0.10 <0.10 <0.10  LATICACIDVEN  --  2.9* 1.4  --   --   --   --   TSH  --   --   --   --  9.178*  --   --   BNP  --   --   --   --   --   2,934.6*  --   MG 1.7  --   --  1.6* 2.0 1.8  --   CALCIUM 8.3*  --   --  8.1* 8.2* 7.9*  --    Lab Results  Component Value Date   HGBA1C 7.0 (H) 04/02/2023    Recent Labs    05/07/23 0625  TSH 9.178*     Radiology Reports EEG adult  Result Date: 05/06/2023 Charlsie Quest, MD     05/06/2023  1:50 PM Patient Name: Peter Yang MRN: [id number] Epilepsy Attending: Charlsie Quest Referring Physician/Provider: Merlene Laughter, DO Date: 05/06/2023 Duration: 23.22 mins Patient history: 66yo M witnessed to be unresponsive at his nursing home with shaking activity that self resolved without any focal deficits noted, and was confused/likely postictal afterwards with gradual recovery to baseline. EEG to evaluate for seizure. Level of alertness: Awake, asleep AEDs during EEG study: None Technical aspects: This EEG study  was done with scalp electrodes positioned according to the 10-20 International system of electrode placement. Electrical activity was reviewed with band pass filter of 1-70Hz , sensitivity of 7 uV/mm, display speed of 63mm/sec with a 60Hz  notched filter applied as appropriate. EEG data were recorded continuously and digitally stored.  Video monitoring was available and reviewed as appropriate. Description: The posterior dominant rhythm consists of 7 Hz activity of moderate voltage (25-35 uV) seen predominantly in posterior head regions, symmetric and reactive to eye opening and eye closing. Sleep was characterized by vertex waves, sleep spindles (12 to 14 Hz), maximal frontocentral region. EEG showed continuous generalized 5 to 7 Hz theta slowing. Physiologic photic driving was seen during photic stimulation. Hyperventilation was not performed.   ABNORMALITY - Continuous slow, generalized - Background slow IMPRESSION: This study is suggestive of mild to moderate diffuse encephalopathy, nonspecific etiology. No seizures or epileptiform discharges were seen throughout the recording.  Charlsie Quest   MR BRAIN W WO CONTRAST  Result Date: 05/06/2023 CLINICAL DATA:  Mental status change, unknown cause. EXAM: MRI HEAD WITHOUT AND WITH CONTRAST TECHNIQUE: Multiplanar, multiecho pulse sequences of the brain and surrounding structures were obtained without and with intravenous contrast. CONTRAST:  10mL GADAVIST GADOBUTROL 1 MMOL/ML IV SOLN COMPARISON:  MRI brain 07/28/2021.  Head CT 05/06/2023. FINDINGS: Brain: No acute infarct or hemorrhage. Unchanged encephalomalacia from prior infarct along the right temporoparietal junction. Encephalomalacia and hemosiderin staining from prior hemorrhage in the right corona radiata. No hydrocephalus or extra-axial collection. No mass or abnormal enhancement. Vascular: Normal flow voids and vessel enhancement. Skull and upper cervical spine: Normal marrow signal and enhancement. Sinuses/Orbits: Unremarkable. Other: None. IMPRESSION: 1. No acute intracranial abnormality or mass. 2. Unchanged encephalomalacia from prior infarct along the right temporoparietal junction and right corona radiata. Electronically Signed   By: Orvan Falconer M.D.   On: 05/06/2023 10:27   CT Head Wo Contrast  Result Date: 05/06/2023 CLINICAL DATA:  Delirium EXAM: CT HEAD WITHOUT CONTRAST TECHNIQUE: Contiguous axial images were obtained from the base of the skull through the vertex without intravenous contrast. RADIATION DOSE REDUCTION: This exam was performed according to the departmental dose-optimization program which includes automated exposure control, adjustment of the mA and/or kV according to patient size and/or use of iterative reconstruction technique. COMPARISON:  04/19/2023 FINDINGS: Brain: No evidence of acute infarction, hemorrhage, hydrocephalus, extra-axial collection or mass lesion/mass effect. Small chronic cortex infarcts seen along the right cerebral convexity. Generalized brain atrophy. Vascular: No hyperdense vessel or unexpected calcification. Skull: Normal.  Negative for fracture or focal lesion. Sinuses/Orbits: No acute finding. IMPRESSION: 1. No acute finding. 2. Small chronic infarcts scattered in the right cerebral cortex. Electronically Signed   By: Tiburcio Pea M.D.   On: 05/06/2023 04:06   DG Chest Portable 1 View  Result Date: 05/06/2023 CLINICAL DATA:  Weakness EXAM: PORTABLE CHEST 1 VIEW COMPARISON:  04/03/2023 FINDINGS: Pulmonary insufflation is stable. Diffusely increased opacity of the right hemithorax likely relates to posteriorly layering small to moderate pleural fluid on this supine examination. Moderate left pleural effusion also noted. Superimposed left basilar atelectasis or infiltrate is present. No definite pneumothorax. Right upper extremity PICC line tip seen at the superior cavoatrial junction. Cardiac size within normal limits. Pulmonary vascularity is normal. No acute bone abnormality. IMPRESSION: 1. Small to moderate bilateral pleural effusions. 2. Left basilar atelectasis or infiltrate. Electronically Signed   By: Helyn Numbers M.D.   On: 05/06/2023 03:26      Signature  -  Susa Raring M.D on 05/09/2023 at 8:51 AM   -  To page go to [email or web address]

## 2023-05-10 LAB — CBC WITH DIFFERENTIAL/PLATELET
Abs Immature Granulocytes: 0.05 10*3/uL (ref 0.00–0.07)
Basophils Absolute: 0 10*3/uL (ref 0.0–0.1)
Basophils Relative: 0 %
Eosinophils Absolute: 0.2 10*3/uL (ref 0.0–0.5)
Eosinophils Relative: 1 %
HCT: 26.5 % — ABNORMAL LOW (ref 39.0–52.0)
Hemoglobin: 8.2 g/dL — ABNORMAL LOW (ref 13.0–17.0)
Immature Granulocytes: 1 %
Lymphocytes Relative: 7 %
Lymphs Abs: 0.8 10*3/uL (ref 0.7–4.0)
MCH: 25.4 pg — ABNORMAL LOW (ref 26.0–34.0)
MCHC: 30.9 g/dL (ref 30.0–36.0)
MCV: 82 fL (ref 80.0–100.0)
Monocytes Absolute: 0.7 10*3/uL (ref 0.1–1.0)
Monocytes Relative: 6 %
Neutro Abs: 9.3 10*3/uL — ABNORMAL HIGH (ref 1.7–7.7)
Neutrophils Relative %: 85 %
Platelets: 162 10*3/uL (ref 150–400)
RBC: 3.23 MIL/uL — ABNORMAL LOW (ref 4.22–5.81)
RDW: 22.5 % — ABNORMAL HIGH (ref 11.5–15.5)
WBC: 11 10*3/uL — ABNORMAL HIGH (ref 4.0–10.5)
nRBC: 0 % (ref 0.0–0.2)

## 2023-05-10 LAB — BRAIN NATRIURETIC PEPTIDE: B Natriuretic Peptide: >4500 pg/mL — ABNORMAL HIGH (ref 0.0–100.0)

## 2023-05-10 LAB — C-REACTIVE PROTEIN: CRP: 1 mg/dL — ABNORMAL HIGH (ref <1.0)

## 2023-05-10 LAB — GLUCOSE, CAPILLARY
Glucose-Capillary: 174 mg/dL — ABNORMAL HIGH (ref 70–99)
Glucose-Capillary: 199 mg/dL — ABNORMAL HIGH (ref 70–99)
Glucose-Capillary: 193 mg/dL — ABNORMAL HIGH (ref 70–99)
Glucose-Capillary: 184 mg/dL — ABNORMAL HIGH (ref 70–99)

## 2023-05-10 LAB — TYPE AND SCREEN: Unit division: 0

## 2023-05-10 LAB — BASIC METABOLIC PANEL
Anion gap: 10 (ref 5–15)
BUN: 32 mg/dL — ABNORMAL HIGH (ref 8–23)
CO2: 24 mmol/L (ref 22–32)
Calcium: 8.9 mg/dL (ref 8.9–10.3)
Chloride: 100 mmol/L (ref 98–111)
Creatinine, Ser: 1.14 mg/dL (ref 0.61–1.24)
GFR, Estimated: >60 mL/min (ref >60)
Glucose, Bld: 208 mg/dL — ABNORMAL HIGH (ref 70–99)
Potassium: 3.8 mmol/L (ref 3.5–5.1)
Sodium: 134 mmol/L — ABNORMAL LOW (ref 135–145)

## 2023-05-10 LAB — MAGNESIUM: Magnesium: 1.7 mg/dL (ref 1.7–2.4)

## 2023-05-10 LAB — BPAM RBC: Blood Product Expiration Date: 202408132359

## 2023-05-10 LAB — PROCALCITONIN: Procalcitonin: <0.1 ng/mL

## 2023-05-10 MED ORDER — FERROUS SULFATE 325 (65 FE) MG PO TABS
325.0000 mg | ORAL_TABLET | Freq: Two times a day (BID) | ORAL | Status: DC
  Administered 2023-05-10 – 2023-05-13 (×7): 325 mg via ORAL
  Filled 2023-05-10 (×7): qty 1

## 2023-05-10 MED ORDER — FUROSEMIDE 10 MG/ML IJ SOLN
40.0000 mg | Freq: Two times a day (BID) | INTRAMUSCULAR | Status: AC
  Administered 2023-05-10 (×2): 40 mg via INTRAVENOUS
  Filled 2023-05-10 (×2): qty 4

## 2023-05-10 MED ORDER — FOLIC ACID 1 MG PO TABS
1.0000 mg | ORAL_TABLET | Freq: Every day | ORAL | Status: DC
  Administered 2023-05-10 – 2023-05-13 (×4): 1 mg via ORAL
  Filled 2023-05-10 (×4): qty 1

## 2023-05-10 MED ORDER — ALBUMIN HUMAN 25 % IV SOLN
50.0000 g | Freq: Two times a day (BID) | INTRAVENOUS | Status: AC
  Administered 2023-05-10 (×2): 50 g via INTRAVENOUS
  Filled 2023-05-10 (×2): qty 200

## 2023-05-10 NOTE — Progress Notes (Signed)
PROGRESS NOTE                                                                                                                                                                                                             Patient Demographics:    Peter Yang, is a 66 y.o. male, DOB - Mar 07, 1956, WUJ:[id number]  Outpatient Primary MD for the patient is Patient, No Pcp Per    LOS - 3  Admit date - 05/06/2023    Chief Complaint  Patient presents with   Altered Mental Status       Brief Narrative (HPI from H&P)    67 y.o. male with medical history significant for but not limited to CAD, diabetic polyneuropathy with diabetes mellitus type 2, history of NSTEMI, history of stent placement, hyperlipidemia, BPH, history of large bowel perforation, history of PAF, ischemic cardiomyopathy, history of TIA and stroke, history of diabetic foot ulcer, history of BKA on the left as well as other comorbidities including systolic and diastolic combined CHF and LVEF of 35% and grade 2 diastolic dysfunction who was recently hospitalized on 04/01/2023 until 04/18/2023 from his skilled nursing facility urinary retention, Foley catheter placement and subsequent development and finding of Fournier's gangrene.  He was seen by urology underwent surgical debridement twice first on 04/02/2023 and then again on 04/09/2023 by Dr. Marlou Porch, he was placed on IV daptomycin and ertapenem and discharged back to nursing home.  He now comes back with altered mental status.   Subjective:   Patient in bed, appears comfortable, denies any headache, no fever, no chest pain or pressure, no shortness of breath , no abdominal pain. No focal weakness.   Assessment  & Plan :   Metabolic encephalopathy with hypotension and some generalized shaking likely due to hypotension and dehydration in the setting of chronic adrenal insufficiency.  Do not think this was a seizure, he has  history of adrenal insufficiency is chronically on steroids and was clinically dehydrated and hypotensive, MRI brain and EEG nonacute, increased home dose steroid, hydrated with IV fluids, PT OT and monitor.  This was discussed by neurologist upon admission.  Blood pressure and encephalopathy much improved.  Recent history of Fournier's gangrene.  Debridement twice -  first on 04/02/2023 and then again on 04/09/2023 by Dr. Marlou Porch, he was placed on IV daptomycin and ertapenem  and discharged back to nursing home ABX end date 05/20/2023 , continue present antibiotics, has Foley catheter and right arm PICC line, urology to see.  Do not think there is an acute change.  Case discussed with urology on 05/08/2023 they will look into changing Foley catheter once patient's anatomy allows.  History of urinary retention.  Foley catheter as above.  Anemia of chronic disease, worse with ongoing Hematuria POA,  Hold Eliquis, type screen, PO iron and folic acid, continue to monitor CBC closely.  Paroxysmal atrial fibrillation.  Not on rate controlling agents, on Eliquis , see above.  [place] vas 2 score of greater than 3.  Chronic adrenal insufficiency with hypotension, on chronic steroids.  Increase steroids, hydrate and monitor.  Moderate to severe protein calorie malnutrition.  Protein supplements.  Has anasarca chronically, albumin + Lasix  Dehydration, hypoperfusion induced lactic acidosis, hypomagnesemia and hyponatremia.  Resolved after IV fluids.  No signs of sepsis.  Ignacia replaced.  Chronic combined systolic and diastolic CHF.  EF 35%.  Blood pressure too low for beta-blocker or ACE inhibitor, currently compensated.  Monitor.    Hypertension.  Currently blood pressure is soft.  Midodrine, higher than home dose steroids and IV fluids.  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  Supportive care.  PT OT.  DM type II.  On long-acting insulin along with sliding scale.   Monitor.  Lab Results  Component Value Date   HGBA1C 7.0 (H) 04/02/2023    CBG (last 3)  Recent Labs    05/09/23 1611 05/09/23 2127 05/10/23 0806  GLUCAP 146* 181* 174*         Condition - Extremely Guarded  Family Communication  :  [name] [phone]  - 05/07/23  Code Status :  DNR  Consults  :  Neuro, Urology  PUD Prophylaxis :     Procedures  :     EEG -  This study is suggestive of mild to moderate diffuse encephalopathy, nonspecific etiology. No seizures or epileptiform discharges were seen throughout the recording.  MRI  1. No acute intracranial abnormality or mass. 2. Unchanged encephalomalacia from prior infarct along the right temporoparietal junction and right corona radiata.      Disposition Plan  :    Status is: Observation  DVT Prophylaxis  :    heparin injection 5,000 Units Start: 05/10/23 0600 SCDs Start: 05/06/23 1531     Lab Results  Component Value Date   PLT 162 05/10/2023    Diet :  Diet Order             Diet heart healthy/carb modified Room service appropriate? Yes; Fluid consistency: Thin  Diet effective now                    Inpatient Medications  Scheduled Meds:  (feeding supplement) PROSource Plus  30 mL Oral BID BM   atorvastatin  80 mg Oral Daily   B-complex with vitamin C  1 tablet Oral Daily   Chlorhexidine Gluconate Cloth  6 each Topical Daily   docusate sodium  100 mg Oral BID   feeding supplement  237 mL Oral TID BM   ferrous sulfate  325 mg Oral BID WC   finasteride  5 mg Oral Daily   folic acid  1 mg Oral Daily   furosemide  40 mg Intravenous Q12H   gabapentin  300 mg Oral QHS   heparin injection (subcutaneous)  5,000 Units Subcutaneous Q8H  hydrocortisone  20 mg Oral BID   insulin aspart  0-15 Units Subcutaneous TID WC   insulin aspart  0-5 Units Subcutaneous QHS   insulin glargine-yfgn  20 Units Subcutaneous QHS   melatonin  5 mg Oral QHS   midodrine  10 mg Oral TID with meals   nystatin    Topical TID   oxyCODONE  15 mg Oral Q12H   pantoprazole  40 mg Oral Daily   saccharomyces boulardii  250 mg Oral BID   senna-docusate  1 tablet Oral BID   sodium chloride flush  10-40 mL Intracatheter Q12H   tamsulosin  0.4 mg Oral Daily   zinc sulfate  220 mg Oral Daily   Continuous Infusions:  albumin human 50 g (05/10/23 0851)   DAPTOmycin (CUBICIN) 800 mg in sodium chloride 0.9 % IVPB Stopped (05/09/23 1507)   meropenem (MERREM) IV 1 g (05/10/23 0513)   PRN Meds:.albuterol, bisacodyl, hydrALAZINE, HYDROmorphone (DILAUDID) injection, LORazepam, oxyCODONE    Objective:   Vitals:   05/09/23 1638 05/09/23 2010 05/10/23 0025 05/10/23 0805  BP: 102/67 105/76 102/73 105/74  Pulse: 86 88 86   Resp: 15 18 (!) 21 (!) 22  Temp: 97.8 F (36.6 C) 98.1 F (36.7 C) 97.9 F (36.6 C) 97.6 F (36.4 C)  TempSrc:      SpO2: 97% 95% 99%   Weight:      Height:        Wt Readings from Last 3 Encounters:  05/08/23 120 kg  04/19/23 109.3 kg  04/16/23 110.7 kg     Intake/Output Summary (Last 24 hours) at 05/10/2023 0952 Last data filed at 05/10/2023 6578 Gross per 24 hour  Intake 467.26 ml  Output 3800 ml  Net -3332.74 ml     Physical Exam  Awake Alert, No new F.N deficits, Normal affect [place].AT,PERRAL Supple Neck, No JVD,   Symmetrical Chest wall movement, Good air movement bilaterally, CTAB RRR,No Gallops,Rubs or new Murmurs,  +ve B.Sounds, Abd Soft, No tenderness,   2+ anasarca  Scrotum looks stable, sutures in place, foley , L BKA, right arm PICC line  RN pressure injury documentation: Pressure Injury 08/03/22 Buttocks Mid Stage 2 -  Partial thickness loss of dermis presenting as a shallow open injury with a red, pink wound bed without slough. area of red blanchable and non blanchable, and areas with skin sloughed off (Active)  08/03/22 1513  Location: Buttocks  Location Orientation: Mid  Staging: Stage 2 -  Partial thickness loss of dermis presenting as a shallow open  injury with a red, pink wound bed without slough.  Wound Description (Comments): area of red blanchable and non blanchable, and areas with skin sloughed off  Present on Admission: Yes  Dressing Type Foam - Lift dressing to assess site every shift 05/10/23 0735      Data Review:    Recent Labs  Lab 05/06/23 0830 05/07/23 0625 05/08/23 0351 05/09/23 0220 05/10/23 0030  WBC 7.8 10.6* 7.5 8.5 11.0*  HGB 8.5* 9.2* 8.2* 7.6* 8.2*  HCT 28.7* 29.6* 27.3* 25.7* 26.5*  PLT 206 214 191 139* 162  MCV 81.1 81.3 82.7 81.8 82.0  MCH 24.0* 25.3* 24.8* 24.2* 25.4*  MCHC 29.6* 31.1 30.0 29.6* 30.9  RDW 22.5* 22.6* 22.7* 22.6* 22.5*  LYMPHSABS 1.4 0.3* 0.7 0.8 0.8  MONOABS 0.8 0.6 0.2 0.3 0.7  EOSABS 0.5 0.5 0.0 0.0 0.2  BASOSABS 0.1 0.1 0.0 0.0 0.0    Recent Labs  Lab 05/06/23 0058 05/06/23 0100  05/06/23 0351 05/06/23 0830 05/07/23 0625 05/08/23 0351 05/09/23 0220 05/09/23 1630 05/10/23 0030  NA 133*  --   --  133* 135 132* 134*  --  134*  K 4.1  --   --  3.8 3.9 4.1 3.9  --  3.8  CL 104  --   --  104 104 104 104  --  100  CO2 19*  --   --  22 23 22  21*  --  24  ANIONGAP 10  --   --  7 8 6 9   --  10  GLUCOSE 184*  --   --  99 134* 192* 191*  --  208*  BUN 20  --   --  17 17 19  26*  --  32*  CREATININE 1.10  --   --  1.01 0.95 0.91 1.15  --  1.14  AST 26  --   --  25 26  --   --   --   --   ALT 16  --   --  16 16  --   --   --   --   ALKPHOS 94  --   --  82 85  --   --   --   --   BILITOT 0.9  --   --  0.8 0.8  --   --   --   --   ALBUMIN 2.4*  --   --  2.3* 2.1*  --   --   --   --   CRP  --   --   --   --  2.6* 4.0*  --  1.1* 1.0*  PROCALCITON  --   --   --   --  <0.10 <0.10 <0.10  --  <0.10  LATICACIDVEN  --  2.9* 1.4  --   --   --   --   --   --   TSH  --   --   --   --  9.178*  --   --   --   --   BNP  --   --   --   --   --  2,934.6* 3,947.5*  --  >4,500.0*  MG 1.7  --   --  1.6* 2.0 1.8 1.8  --  1.7  CALCIUM 8.3*  --   --  8.1* 8.2* 7.9* 8.3*  --  8.9      Recent  Labs  Lab 05/06/23 0100 05/06/23 0351 05/06/23 0830 05/07/23 0625 05/08/23 0351 05/09/23 0220 05/09/23 1630 05/10/23 0030  CRP  --   --   --  2.6* 4.0*  --  1.1* 1.0*  PROCALCITON  --   --   --  <0.10 <0.10 <0.10  --  <0.10  LATICACIDVEN 2.9* 1.4  --   --   --   --   --   --   TSH  --   --   --  9.178*  --   --   --   --   BNP  --   --   --   --  2,934.6* 3,947.5*  --  >4,500.0*  MG  --   --  1.6* 2.0 1.8 1.8  --  1.7  CALCIUM  --   --  8.1* 8.2* 7.9* 8.3*  --  8.9   Lab Results  Component Value Date   HGBA1C 7.0 (H) 04/02/2023    No results for input(s): "TSH", "T4TOTAL", "T3FREE", "  THYROIDAB" in the last 72 hours.  Invalid input(s): "FREET3"    Radiology Reports EEG adult  Result Date: 05/06/2023 Charlsie Quest, MD     05/06/2023  1:50 PM Patient Name: Lindell Renfrew MRN: [id number] Epilepsy Attending: Charlsie Quest Referring Physician/Provider: Merlene Laughter, DO Date: 05/06/2023 Duration: 23.22 mins Patient history: 67yo M witnessed to be unresponsive at his nursing home with shaking activity that self resolved without any focal deficits noted, and was confused/likely postictal afterwards with gradual recovery to baseline. EEG to evaluate for seizure. Level of alertness: Awake, asleep AEDs during EEG study: None Technical aspects: This EEG study was done with scalp electrodes positioned according to the 10-20 International system of electrode placement. Electrical activity was reviewed with band pass filter of 1-70Hz , sensitivity of 7 uV/mm, display speed of 100mm/sec with a 60Hz  notched filter applied as appropriate. EEG data were recorded continuously and digitally stored.  Video monitoring was available and reviewed as appropriate. Description: The posterior dominant rhythm consists of 7 Hz activity of moderate voltage (25-35 uV) seen predominantly in posterior head regions, symmetric and reactive to eye opening and eye closing. Sleep was characterized by vertex waves,  sleep spindles (12 to 14 Hz), maximal frontocentral region. EEG showed continuous generalized 5 to 7 Hz theta slowing. Physiologic photic driving was seen during photic stimulation. Hyperventilation was not performed.   ABNORMALITY - Continuous slow, generalized - Background slow IMPRESSION: This study is suggestive of mild to moderate diffuse encephalopathy, nonspecific etiology. No seizures or epileptiform discharges were seen throughout the recording. Charlsie Quest      Signature  -   Susa Raring M.D on 05/10/2023 at 9:52 AM   -  To page go to [email or web address]

## 2023-05-10 NOTE — Progress Notes (Signed)
S: Pt c/o groin pain.   O: Vitals:   05/10/23 0805 05/10/23 1230  BP: 105/74 108/70  Pulse:  84  Resp: (!) 22 15  Temp: 97.6 F (36.4 C) 97.8 F (36.6 C)  SpO2:     CBC    Component Value Date/Time   WBC 11.0 (H) 05/10/2023 0030   RBC 3.23 (L) 05/10/2023 0030   HGB 8.2 (L) 05/10/2023 0030   HCT 26.5 (L) 05/10/2023 0030   PLT 162 05/10/2023 0030   MCV 82.0 05/10/2023 0030   MCH 25.4 (L) 05/10/2023 0030   MCHC 30.9 05/10/2023 0030   RDW 22.5 (H) 05/10/2023 0030   LYMPHSABS 0.8 05/10/2023 0030   MONOABS 0.7 05/10/2023 0030   EOSABS 0.2 05/10/2023 0030   BASOSABS 0.0 05/10/2023 0030   He is alert and oriented.  Hip, SP area, penis, scrotum anasarca remains. Penis buried. Scrotal incisions C/D/I  A/P:  #Fourniers gangrene #tinea cruis   Sutures are still in place on both scrotal incisions. This will continue to weep.  Nursing to change ABD pads often. Recommend antifungal powder, elevating scrotum, and diligently keeping dry.   #Urinary Retention  Foley catheter placed 6/19. Will be due for exchange or removal.  His pelvic anasarca remains significant.   Will assess Monday. May be a two person assist or need a cystoscopic exchange vs void trial. Should be done while patient on abx.    #ESBL UTI Daptomycin and Merrem per ID

## 2023-05-10 NOTE — Progress Notes (Signed)
Pt refused for dressing change because of pain.Inj Diluadid 0.5mg  given.Will check back again.

## 2023-05-10 NOTE — Progress Notes (Signed)
Pharmacist Heart Failure Core Measure Documentation  Assessment: Peter Yang has an EF documented as 35% on 04/07/23 by ECHO.  Rationale: Heart failure patients with left ventricular systolic dysfunction (LVSD) and an EF < 40% should be prescribed an angiotensin converting enzyme inhibitor (ACEI) or angiotensin receptor blocker (ARB) at discharge unless a contraindication is documented in the medical record.  This patient is not currently on an ACEI or ARB for HF.  This note is being placed in the record in order to provide documentation that a contraindication to the use of these agents is present for this encounter.  ACE Inhibitor or Angiotensin Receptor Blocker is contraindicated (specify all that apply)  []   ACEI allergy AND ARB allergy []   Angioedema []   Moderate or severe aortic stenosis []   Hyperkalemia [x]   Hypotension []   Renal artery stenosis []   Worsening renal function, preexisting renal disease or dysfunction

## 2023-05-10 NOTE — Plan of Care (Signed)

## 2023-05-10 NOTE — Progress Notes (Signed)
Pt refused for dressing change tells want to rest for now.

## 2023-05-11 LAB — CBC WITH DIFFERENTIAL/PLATELET
Abs Immature Granulocytes: 0 10*3/uL (ref 0.00–0.07)
Basophils Absolute: 0.1 10*3/uL (ref 0.0–0.1)
Basophils Relative: 1 %
Eosinophils Absolute: 0 10*3/uL (ref 0.0–0.5)
Eosinophils Relative: 0 %
HCT: 26 % — ABNORMAL LOW (ref 39.0–52.0)
Hemoglobin: 7.8 g/dL — ABNORMAL LOW (ref 13.0–17.0)
Lymphocytes Relative: 7 %
Lymphs Abs: 0.6 10*3/uL — ABNORMAL LOW (ref 0.7–4.0)
MCH: 24.1 pg — ABNORMAL LOW (ref 26.0–34.0)
MCHC: 30 g/dL (ref 30.0–36.0)
MCV: 80.5 fL (ref 80.0–100.0)
Monocytes Absolute: 0.3 10*3/uL (ref 0.1–1.0)
Monocytes Relative: 4 %
Neutro Abs: 7.1 10*3/uL (ref 1.7–7.7)
Neutrophils Relative %: 88 %
Platelets: 136 10*3/uL — ABNORMAL LOW (ref 150–400)
RBC: 3.23 MIL/uL — ABNORMAL LOW (ref 4.22–5.81)
RDW: 22.5 % — ABNORMAL HIGH (ref 11.5–15.5)
WBC: 8.1 10*3/uL (ref 4.0–10.5)
nRBC: 0 % (ref 0.0–0.2)
nRBC: 0 /100 WBC

## 2023-05-11 LAB — PROCALCITONIN: Procalcitonin: <0.1 ng/mL

## 2023-05-11 LAB — GLUCOSE, CAPILLARY
Glucose-Capillary: 177 mg/dL — ABNORMAL HIGH (ref 70–99)
Glucose-Capillary: 148 mg/dL — ABNORMAL HIGH (ref 70–99)
Glucose-Capillary: 191 mg/dL — ABNORMAL HIGH (ref 70–99)
Glucose-Capillary: 170 mg/dL — ABNORMAL HIGH (ref 70–99)

## 2023-05-11 LAB — BASIC METABOLIC PANEL
Anion gap: 8 (ref 5–15)
BUN: 37 mg/dL — ABNORMAL HIGH (ref 8–23)
CO2: 26 mmol/L (ref 22–32)
Calcium: 8.9 mg/dL (ref 8.9–10.3)
Chloride: 101 mmol/L (ref 98–111)
Creatinine, Ser: 0.93 mg/dL (ref 0.61–1.24)
GFR, Estimated: >60 mL/min (ref >60)
Glucose, Bld: 166 mg/dL — ABNORMAL HIGH (ref 70–99)
Potassium: 3.5 mmol/L (ref 3.5–5.1)
Sodium: 135 mmol/L (ref 135–145)

## 2023-05-11 LAB — MAGNESIUM: Magnesium: 1.7 mg/dL (ref 1.7–2.4)

## 2023-05-11 LAB — C-REACTIVE PROTEIN: CRP: 0.6 mg/dL (ref <1.0)

## 2023-05-11 LAB — BRAIN NATRIURETIC PEPTIDE: B Natriuretic Peptide: >4500 pg/mL — ABNORMAL HIGH (ref 0.0–100.0)

## 2023-05-11 MED ORDER — FUROSEMIDE 10 MG/ML IJ SOLN
40.0000 mg | Freq: Two times a day (BID) | INTRAMUSCULAR | Status: AC
  Administered 2023-05-11 (×2): 40 mg via INTRAVENOUS
  Filled 2023-05-11 (×2): qty 4

## 2023-05-11 MED ORDER — ALBUMIN HUMAN 25 % IV SOLN
50.0000 g | Freq: Two times a day (BID) | INTRAVENOUS | Status: AC
  Administered 2023-05-11 (×2): 50 g via INTRAVENOUS
  Filled 2023-05-11 (×2): qty 200

## 2023-05-11 MED ORDER — POTASSIUM CHLORIDE CRYS ER 20 MEQ PO TBCR
40.0000 meq | EXTENDED_RELEASE_TABLET | Freq: Once | ORAL | Status: AC
  Administered 2023-05-11: 40 meq via ORAL
  Filled 2023-05-11: qty 2

## 2023-05-11 MED ORDER — HYDROCORTISONE 10 MG PO TABS
10.0000 mg | ORAL_TABLET | Freq: Two times a day (BID) | ORAL | Status: DC
  Administered 2023-05-11 – 2023-05-13 (×5): 10 mg via ORAL
  Filled 2023-05-11 (×6): qty 1

## 2023-05-11 NOTE — Plan of Care (Signed)

## 2023-05-11 NOTE — Progress Notes (Signed)
PROGRESS NOTE                                                                                                                                                                                                             Patient Demographics:    Peter Yang, is a 67 y.o. male, DOB - 1956/04/23, ZDG:[id number]  Outpatient Primary MD for the patient is Patient, No Pcp Per    LOS - 4  Admit date - 05/06/2023    Chief Complaint  Patient presents with   Altered Mental Status       Brief Narrative (HPI from H&P)    67 y.o. male with medical history significant for but not limited to CAD, diabetic polyneuropathy with diabetes mellitus type 2, history of NSTEMI, history of stent placement, hyperlipidemia, BPH, history of large bowel perforation, history of PAF, ischemic cardiomyopathy, history of TIA and stroke, history of diabetic foot ulcer, history of BKA on the left as well as other comorbidities including systolic and diastolic combined CHF and LVEF of 35% and grade 2 diastolic dysfunction who was recently hospitalized on 04/01/2023 until 04/18/2023 from his skilled nursing facility urinary retention, Foley catheter placement and subsequent development and finding of Fournier's gangrene.  He was seen by urology underwent surgical debridement twice first on 04/02/2023 and then again on 04/09/2023 by Dr. Marlou Porch, he was placed on IV daptomycin and ertapenem and discharged back to nursing home.  He now comes back with altered mental status.   Subjective:   Patient in bed, appears comfortable, denies any headache, no fever, no chest pain or pressure, no shortness of breath , no abdominal pain. No focal weakness.   Assessment  & Plan :   Metabolic encephalopathy with hypotension and some generalized shaking likely due to hypotension and dehydration in the setting of chronic adrenal insufficiency.  Do not think this was a seizure, he has  history of adrenal insufficiency is chronically on steroids and was clinically dehydrated and hypotensive, MRI brain and EEG nonacute, increased home dose steroid, hydrated with IV fluids, PT OT and monitor.  This was discussed by neurologist upon admission.  Blood pressure and encephalopathy much improved.  Recent history of Fournier's gangrene.  Debridement twice -  first on 04/02/2023 and then again on 04/09/2023 by Dr. Marlou Porch, he was placed on IV daptomycin and ertapenem  and discharged back to nursing home ABX end date 05/20/2023 , continue present antibiotics, has Foley catheter and right arm PICC line, urology to see.  Do not think there is an acute change.  Case discussed with urology on 05/08/2023 they will look into changing Foley catheter once patient's anatomy allows likely to be done 05/12/2023.  History of urinary retention.  Foley catheter as above.  Anemia of chronic disease, worse with ongoing Hematuria POA,  Hold Eliquis, type screen, PO iron and folic acid, continue to monitor CBC closely.  Paroxysmal atrial fibrillation.  Not on rate controlling agents, on Eliquis , see above.  [place] vas 2 score of greater than 3.  Chronic adrenal insufficiency with hypotension, on chronic steroids.  Increase steroids, hydrate and monitor.  Moderate to severe protein calorie malnutrition with massive chronic and recurrent anasarca.  Protein supplements.  Has anasarca chronically, albumin + Lasix continued for the last 3 days so far about 6 L negative.  Dehydration, hypoperfusion induced lactic acidosis, hypomagnesemia and hyponatremia.  Resolved after IV fluids.  No signs of sepsis.  Ignacia replaced.  Chronic combined systolic and diastolic CHF.  EF 35%.  Blood pressure too low for beta-blocker or ACE inhibitor, currently compensated.  Monitor.    Hypertension.  Currently blood pressure is soft.  Midodrine, higher than home dose steroids and IV fluids.  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  Supportive care.  PT OT.  DM type II.  On long-acting insulin along with sliding scale.  Monitor.  Lab Results  Component Value Date   HGBA1C 7.0 (H) 04/02/2023    CBG (last 3)  Recent Labs    05/10/23 1625 05/10/23 2255 05/11/23 0759  GLUCAP 193* 184* 177*         Condition - Extremely Guarded  Family Communication  :  [name] [phone]  - 05/07/23  Code Status :  DNR  Consults  :  Neuro, Urology  PUD Prophylaxis :     Procedures  :     EEG -  This study is suggestive of mild to moderate diffuse encephalopathy, nonspecific etiology. No seizures or epileptiform discharges were seen throughout the recording.  MRI  1. No acute intracranial abnormality or mass. 2. Unchanged encephalomalacia from prior infarct along the right temporoparietal junction and right corona radiata.      Disposition Plan  :    Status is: Observation  DVT Prophylaxis  :    heparin injection 5,000 Units Start: 05/10/23 0600 SCDs Start: 05/06/23 1531     Lab Results  Component Value Date   PLT 136 (L) 05/11/2023    Diet :  Diet Order             Diet heart healthy/carb modified Room service appropriate? Yes; Fluid consistency: Thin  Diet effective now                    Inpatient Medications  Scheduled Meds:  (feeding supplement) PROSource Plus  30 mL Oral BID BM   atorvastatin  80 mg Oral Daily   B-complex with vitamin C  1 tablet Oral Daily   Chlorhexidine Gluconate Cloth  6 each Topical Daily   docusate sodium  100 mg Oral BID   feeding supplement  237 mL Oral TID BM   ferrous sulfate  325 mg Oral BID WC   finasteride  5 mg Oral Daily   folic acid  1 mg Oral Daily  furosemide  40 mg Intravenous Q12H   gabapentin  300 mg Oral QHS   heparin injection (subcutaneous)  5,000 Units Subcutaneous Q8H   hydrocortisone  10 mg Oral BID   insulin aspart  0-15 Units Subcutaneous TID WC   insulin aspart  0-5 Units Subcutaneous QHS   insulin  glargine-yfgn  20 Units Subcutaneous QHS   melatonin  5 mg Oral QHS   midodrine  10 mg Oral TID with meals   nystatin   Topical TID   oxyCODONE  15 mg Oral Q12H   pantoprazole  40 mg Oral Daily   potassium chloride  40 mEq Oral Once   saccharomyces boulardii  250 mg Oral BID   senna-docusate  1 tablet Oral BID   sodium chloride flush  10-40 mL Intracatheter Q12H   tamsulosin  0.4 mg Oral Daily   zinc sulfate  220 mg Oral Daily   Continuous Infusions:  albumin human     DAPTOmycin (CUBICIN) 800 mg in sodium chloride 0.9 % IVPB Stopped (05/10/23 1412)   meropenem (MERREM) IV 1 g (05/11/23 0609)   PRN Meds:.albuterol, bisacodyl, hydrALAZINE, HYDROmorphone (DILAUDID) injection, LORazepam, oxyCODONE    Objective:   Vitals:   05/11/23 0000 05/11/23 0425 05/11/23 0609 05/11/23 0800  BP: 106/72 108/67 106/79 96/73  Pulse: 79 79 78   Resp: 11 16 20 18   Temp: 98.6 F (37 C) 98.7 F (37.1 C)  97.8 F (36.6 C)  TempSrc: Axillary Oral  Oral  SpO2: 97% 98% 96%   Weight:  115.9 kg    Height:        Wt Readings from Last 3 Encounters:  05/11/23 115.9 kg  04/19/23 109.3 kg  04/16/23 110.7 kg     Intake/Output Summary (Last 24 hours) at 05/11/2023 0912 Last data filed at 05/11/2023 0500 Gross per 24 hour  Intake 587.15 ml  Output 2200 ml  Net -1612.85 ml     Physical Exam  Awake Alert, No new F.N deficits, Normal affect [place].AT,PERRAL Supple Neck, No JVD,   Symmetrical Chest wall movement, Good air movement bilaterally, CTAB RRR,No Gallops,Rubs or new Murmurs,  +ve B.Sounds, Abd Soft, No tenderness,   2+ anasarca  Scrotum looks stable, sutures in place, foley , L BKA, right arm PICC line  RN pressure injury documentation: Pressure Injury 08/03/22 Buttocks Mid Stage 2 -  Partial thickness loss of dermis presenting as a shallow open injury with a red, pink wound bed without slough. area of red blanchable and non blanchable, and areas with skin sloughed off (Active)   08/03/22 1513  Location: Buttocks  Location Orientation: Mid  Staging: Stage 2 -  Partial thickness loss of dermis presenting as a shallow open injury with a red, pink wound bed without slough.  Wound Description (Comments): area of red blanchable and non blanchable, and areas with skin sloughed off  Present on Admission: Yes  Dressing Type Other (Comment) (foam) 05/10/23 2000      Data Review:    Recent Labs  Lab 05/07/23 0625 05/08/23 0351 05/09/23 0220 05/10/23 0030 05/11/23 0304  WBC 10.6* 7.5 8.5 11.0* 8.1  HGB 9.2* 8.2* 7.6* 8.2* 7.8*  HCT 29.6* 27.3* 25.7* 26.5* 26.0*  PLT 214 191 139* 162 136*  MCV 81.3 82.7 81.8 82.0 80.5  MCH 25.3* 24.8* 24.2* 25.4* 24.1*  MCHC 31.1 30.0 29.6* 30.9 30.0  RDW 22.6* 22.7* 22.6* 22.5* 22.5*  LYMPHSABS 0.3* 0.7 0.8 0.8 0.6*  MONOABS 0.6 0.2 0.3 0.7 0.3  EOSABS  0.5 0.0 0.0 0.2 0.0  BASOSABS 0.1 0.0 0.0 0.0 0.1    Recent Labs  Lab 05/06/23 0058 05/06/23 0100 05/06/23 0351 05/06/23 0830 05/07/23 0625 05/08/23 0351 05/09/23 0220 05/09/23 1630 05/10/23 0030 05/11/23 0304  NA 133*  --   --  133* 135 132* 134*  --  134* 135  K 4.1  --   --  3.8 3.9 4.1 3.9  --  3.8 3.5  CL 104  --   --  104 104 104 104  --  100 101  CO2 19*  --   --  22 23 22  21*  --  24 26  ANIONGAP 10  --   --  7 8 6 9   --  10 8  GLUCOSE 184*  --   --  99 134* 192* 191*  --  208* 166*  BUN 20  --   --  17 17 19  26*  --  32* 37*  CREATININE 1.10  --   --  1.01 0.95 0.91 1.15  --  1.14 0.93  AST 26  --   --  25 26  --   --   --   --   --   ALT 16  --   --  16 16  --   --   --   --   --   ALKPHOS 94  --   --  82 85  --   --   --   --   --   BILITOT 0.9  --   --  0.8 0.8  --   --   --   --   --   ALBUMIN 2.4*  --   --  2.3* 2.1*  --   --   --   --   --   CRP  --   --   --   --  2.6* 4.0*  --  1.1* 1.0* 0.6  PROCALCITON  --   --   --   --  <0.10 <0.10 <0.10  --  <0.10 <0.10  LATICACIDVEN  --  2.9* 1.4  --   --   --   --   --   --   --   TSH  --   --   --    --  9.178*  --   --   --   --   --   BNP  --   --   --   --   --  2,934.6* 3,947.5*  --  >4,500.0* >4,500.0*  MG 1.7  --   --  1.6* 2.0 1.8 1.8  --  1.7 1.7  CALCIUM 8.3*  --   --  8.1* 8.2* 7.9* 8.3*  --  8.9 8.9      Recent Labs  Lab 05/06/23 0100 05/06/23 0351 05/06/23 0830 05/07/23 0625 05/08/23 0351 05/09/23 0220 05/09/23 1630 05/10/23 0030 05/11/23 0304  CRP  --   --   --  2.6* 4.0*  --  1.1* 1.0* 0.6  PROCALCITON  --   --   --  <0.10 <0.10 <0.10  --  <0.10 <0.10  LATICACIDVEN 2.9* 1.4  --   --   --   --   --   --   --   TSH  --   --   --  9.178*  --   --   --   --   --   BNP  --   --   --   --  2,934.6* 3,947.5*  --  >4,500.0* >4,500.0*  MG  --   --    < > 2.0 1.8 1.8  --  1.7 1.7  CALCIUM  --   --    < > 8.2* 7.9* 8.3*  --  8.9 8.9   < > = values in this interval not displayed.   Lab Results  Component Value Date   HGBA1C 7.0 (H) 04/02/2023    No results for input(s): "TSH", "T4TOTAL", "T3FREE", "THYROIDAB" in the last 72 hours.  Invalid input(s): "FREET3"    Radiology Reports No results found.    Signature  -   Susa Raring M.D on 05/11/2023 at 9:12 AM   -  To page go to [email or web address]

## 2023-05-12 LAB — GLUCOSE, CAPILLARY
Glucose-Capillary: 155 mg/dL — ABNORMAL HIGH (ref 70–99)
Glucose-Capillary: 158 mg/dL — ABNORMAL HIGH (ref 70–99)
Glucose-Capillary: 148 mg/dL — ABNORMAL HIGH (ref 70–99)
Glucose-Capillary: 149 mg/dL — ABNORMAL HIGH (ref 70–99)

## 2023-05-12 LAB — BASIC METABOLIC PANEL
Anion gap: 10 (ref 5–15)
BUN: 40 mg/dL — ABNORMAL HIGH (ref 8–23)
CO2: 25 mmol/L (ref 22–32)
Calcium: 9.1 mg/dL (ref 8.9–10.3)
Chloride: 99 mmol/L (ref 98–111)
Creatinine, Ser: 1.03 mg/dL (ref 0.61–1.24)
GFR, Estimated: >60 mL/min (ref >60)
Glucose, Bld: 179 mg/dL — ABNORMAL HIGH (ref 70–99)
Potassium: 3.5 mmol/L (ref 3.5–5.1)
Sodium: 134 mmol/L — ABNORMAL LOW (ref 135–145)

## 2023-05-12 LAB — CBC WITH DIFFERENTIAL/PLATELET
Abs Immature Granulocytes: 0.04 10*3/uL (ref 0.00–0.07)
Basophils Absolute: 0.1 10*3/uL (ref 0.0–0.1)
Basophils Relative: 1 %
Eosinophils Absolute: 0.5 10*3/uL (ref 0.0–0.5)
Eosinophils Relative: 5 %
HCT: 26.1 % — ABNORMAL LOW (ref 39.0–52.0)
Hemoglobin: 7.9 g/dL — ABNORMAL LOW (ref 13.0–17.0)
Immature Granulocytes: 1 %
Lymphocytes Relative: 16 %
Lymphs Abs: 1.4 10*3/uL (ref 0.7–4.0)
MCH: 24 pg — ABNORMAL LOW (ref 26.0–34.0)
MCHC: 30.3 g/dL (ref 30.0–36.0)
MCV: 79.3 fL — ABNORMAL LOW (ref 80.0–100.0)
Monocytes Absolute: 0.9 10*3/uL (ref 0.1–1.0)
Monocytes Relative: 10 %
Neutro Abs: 6.1 10*3/uL (ref 1.7–7.7)
Neutrophils Relative %: 67 %
Platelets: 142 10*3/uL — ABNORMAL LOW (ref 150–400)
RBC: 3.29 MIL/uL — ABNORMAL LOW (ref 4.22–5.81)
RDW: 22.5 % — ABNORMAL HIGH (ref 11.5–15.5)
WBC: 8.8 10*3/uL (ref 4.0–10.5)
nRBC: 0 % (ref 0.0–0.2)

## 2023-05-12 LAB — PROCALCITONIN: Procalcitonin: <0.1 ng/mL

## 2023-05-12 MED ORDER — ALBUMIN HUMAN 25 % IV SOLN
50.0000 g | Freq: Two times a day (BID) | INTRAVENOUS | Status: AC
  Administered 2023-05-12 (×2): 50 g via INTRAVENOUS
  Filled 2023-05-12 (×2): qty 200

## 2023-05-12 MED ORDER — FUROSEMIDE 10 MG/ML IJ SOLN
40.0000 mg | Freq: Two times a day (BID) | INTRAMUSCULAR | Status: AC
  Administered 2023-05-12 (×2): 40 mg via INTRAVENOUS
  Filled 2023-05-12 (×2): qty 4

## 2023-05-12 MED ORDER — POTASSIUM CHLORIDE CRYS ER 20 MEQ PO TBCR
40.0000 meq | EXTENDED_RELEASE_TABLET | Freq: Once | ORAL | Status: AC
  Administered 2023-05-12: 40 meq via ORAL
  Filled 2023-05-12: qty 2

## 2023-05-12 NOTE — TOC Progression Note (Signed)
Transition of Care [facility]) - Progression Note    Patient Details  Name: Peter Yang MRN: [id number] Date of Birth: 07/22/1956  Transition of Care [facility]) CM/SW Contact  Mearl Latin, LCSW Phone Number: 05/12/2023, 11:43 AM  Clinical Narrative:    CSW made [name] aware of medical stability in about 1-2 days. Financial Counseling to speak with patient about Medicaid process.    Expected Discharge Plan: Skilled Nursing Facility Barriers to Discharge: Continued Medical Work up  Expected Discharge Plan and Services In-house Referral: Clinical Social Work   Post Acute Care Choice: Skilled Nursing Facility Living arrangements for the past 2 months: Skilled Nursing Facility                                       Social Determinants of Health (SDOH) Interventions SDOH Screenings   Food Insecurity: No Food Insecurity (05/09/2023)  Housing: Low Risk  (05/09/2023)  Transportation Needs: No Transportation Needs (05/09/2023)  Utilities: Not At Risk (05/09/2023)  Financial Resource Strain: Low Risk  (08/13/2018)   Received from [facility] visits prior to 12/21/2022.  Tobacco Use: Low Risk  (05/06/2023)    Readmission Risk Interventions    05/07/2023    4:42 PM 10/24/2022   11:21 AM 09/02/2022   10:36 AM  Readmission Risk Prevention Plan  Transportation Screening Complete Complete Complete  Medication Review Oceanographer)  Complete Complete  PCP or Specialist appointment within 3-5 days of discharge Complete Complete Complete  HRI or Home Care Consult Complete Complete Complete  SW Recovery Care/Counseling Consult Complete Complete Complete  Palliative Care Screening Not Applicable Not Applicable Not Applicable  Skilled Nursing Facility Complete Complete Complete

## 2023-05-12 NOTE — Care Management Important Message (Signed)
Important Message  Patient Details  Name: Peter Yang MRN: [id number] Date of Birth: 1956/05/19   Medicare Important Message Given:  Yes     Dorena Bodo 05/12/2023, 4:21 PM

## 2023-05-12 NOTE — Procedures (Signed)
Peter Yang has been past due for catheter exchange but due to extreme anasarca with buried penis, skin was rigid and we were unable to access the glans.  After several days of Lasix and albumin treatment we have seen enough progress to proceed.  Please see separate progress note.  After reviewing the procedure patient consented.  Present Foley catheter was removed.  Patient was prepped and draped in the usual sterile fashion.  The glans could be accessed by finger but not visualized.  I was able to use my finger as a track and placed an 77f coud catheter.  Patient bear down aggressively as catheter near the prostate.  I was able to get him to relax enough to pass into the bladder with return of blood-tinged urine.  I was able to flush the catheter quantitatively and qualitatively.  At this point I inflated the balloon with 10cc of sterile water.  Catheter was placed to drainage with no dependent loops in tubing.  This concluded the procedure.  Patient will be due for an exchange in approximately 30 days, on or about 06/12/2023.

## 2023-05-12 NOTE — Progress Notes (Addendum)
     Subjective: Patient resting on rounds this morning. Amenable to proceeding with catheter exchange.   Objective: Vital signs in last 24 hours: Temp:  [97.7 F (36.5 C)-98.3 F (36.8 C)] 97.9 F (36.6 C) (07/22 0800) Pulse Rate:  [74-88] 74 (07/22 0400) Resp:  [0-18] 11 (07/22 0800) BP: (95-103)/(53-78) 98/78 (07/22 0800) SpO2:  [95 %-97 %] 96 % (07/22 0400) Weight:  [865 kg] 115 kg (07/22 0500)  Intake/Output from previous day: 07/21 0701 - 07/22 0700 In: 805.9 [P.O.:240; IV Piggyback:565.9] Out: 1350 [Urine:1350]  Intake/Output this shift: No intake/output data recorded.  Physical Exam:  General: Alert and oriented CV: No cyanosis Lungs: equal chest rise Abdomen: Soft, NTND, no rebound or guarding Skin: Significant pelvic anasarca and tinea cruris Gu: Foley catheter in place draining clear yellow urine.  Lab Results: Recent Labs    05/10/23 0030 05/11/23 0304 05/12/23 0112  HGB 8.2* 7.8* 7.9*  HCT 26.5* 26.0* 26.1*   BMET Recent Labs    05/11/23 0304 05/12/23 0112  NA 135 134*  K 3.5 3.5  CL 101 99  CO2 26 25  GLUCOSE 166* 179*  BUN 37* 40*  CREATININE 0.93 1.03  CALCIUM 8.9 9.1     Studies/Results: No results found.  Assessment/Plan: #Fourniers gangrene #tinea cruis   Quarter-inch Penrose drain out. Stop SGLT2 Sutures are still in place on both scrotal incisions.  I will leave them for now as the patient's anasarca has returned and these have a high risk for dehiscence.  Pitting edema of scrotum. Nursing to change ABD pads often. Weeping appears to have stopped. Will likely return after effects of albumin/lasix subside.  Discussed his primary surgeon, Dr. Marlou Porch. Recommend antifungal powder, elevating scrotum, and diligently keeping dry. His groin is soaking wet often and he will not heal unless this can be corrected. Discussed with nursing.     #Urinary Retention  Was finally able to access glans, but foley placement still had to be  done blind. See separate procedure note. Will need to be exchanged on or about 8/22.   #ESBL UTI Daptomycin and Merrem per ID   LOS: 5 days   Elmon Kirschner, NP [facility] Pager: [phone]  05/12/2023, 11:59 AM

## 2023-05-12 NOTE — Progress Notes (Addendum)
PROGRESS NOTE                                                                                                                                                                                                             Patient Demographics:    Peter Yang, is a 67 y.o. male, DOB - [birth year], ZOX:[id number]  Outpatient Primary MD for the patient is Patient, No Pcp Per    LOS - 5  Admit date - 05/06/2023    Chief Complaint  Patient presents with   Altered Mental Status       Brief Narrative (HPI from H&P)    67 y.o. male with medical history significant for but not limited to CAD, diabetic polyneuropathy with diabetes mellitus type 2, history of NSTEMI, history of stent placement, hyperlipidemia, BPH, history of large bowel perforation, history of PAF, ischemic cardiomyopathy, history of TIA and stroke, history of diabetic foot ulcer, history of BKA on the left as well as other comorbidities including systolic and diastolic combined CHF and LVEF of 35% and grade 2 diastolic dysfunction who was recently hospitalized on 04/01/2023 until 04/18/2023 from his skilled nursing facility urinary retention, Foley catheter placement and subsequent development and finding of Fournier's gangrene.  He was seen by urology underwent surgical debridement twice first on 04/02/2023 and then again on 04/09/2023 by Dr. Marlou Porch, he was placed on IV daptomycin and ertapenem and discharged back to nursing home.  He now comes back with altered mental status.   Subjective:   Patient in bed, appears comfortable, denies any headache, no fever, no chest pain or pressure, no shortness of breath , no abdominal pain. No focal weakness.   Assessment  & Plan :   Metabolic encephalopathy with hypotension and some generalized shaking likely due to hypotension and dehydration in the setting of chronic adrenal insufficiency.  Do not think this was a seizure, he has  history of adrenal insufficiency is chronically on steroids and was clinically dehydrated and hypotensive, MRI brain and EEG nonacute, increased home dose steroid, hydrated with IV fluids, PT OT and monitor.  This was discussed by neurologist upon admission.  Blood pressure and encephalopathy much improved.  Recent history of Fournier's gangrene.  Debridement twice -  first on 04/02/2023 and then again on 04/09/2023 by Dr. Marlou Porch, he was placed on IV daptomycin and ertapenem  and discharged back to nursing home ABX end date 05/20/2023 , continue present antibiotics, has Foley catheter and right arm PICC line, urology to see.  Do not think there is an acute change.  Case discussed with urology on 05/08/2023 they will look into changing Foley catheter once patient's anatomy allows likely to be done 05/12/2023.  History of urinary retention.  Foley catheter as above.  Anemia of chronic disease, worse with ongoing Hematuria POA,  Hold Eliquis, type screen, PO iron and folic acid, continue to monitor CBC closely.  Hematuria is clearing up on 05/12/2023.  Paroxysmal atrial fibrillation.  Not on rate controlling agents, on Eliquis , see above.  [place] vas 2 score of greater than 3.  Chronic adrenal insufficiency with hypotension, on chronic steroids.  Increase steroids, hydrate and monitor.  Moderate to severe protein calorie malnutrition with massive chronic and recurrent anasarca.  Protein supplements.  Has anasarca chronically, albumin + Lasix continued for the last 4 days so far about 7 L negative.  Dehydration, hypoperfusion induced lactic acidosis, hypomagnesemia and hyponatremia.  Resolved after IV fluids.  No signs of sepsis.  Ignacia replaced.  Chronic combined systolic and diastolic CHF.  EF 35%.  Blood pressure too low for beta-blocker or ACE inhibitor, currently compensated.  Monitor.    Hypertension.  Currently blood pressure is soft.  Midodrine, higher than home dose steroids and IV  fluids.  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  Supportive care.  PT OT.  DM type II.  On long-acting insulin along with sliding scale.  Monitor.  Lab Results  Component Value Date   HGBA1C 7.0 (H) 04/02/2023    CBG (last 3)  Recent Labs    05/11/23 1621 05/11/23 2105 05/12/23 0820  GLUCAP 191* 170* 155*         Condition - Extremely Guarded  Family Communication  :  [name] [phone]  - 05/07/23  Code Status :  DNR  Consults  :  Neuro, Urology  PUD Prophylaxis :     Procedures  :     EEG -  This study is suggestive of mild to moderate diffuse encephalopathy, nonspecific etiology. No seizures or epileptiform discharges were seen throughout the recording.  MRI  1. No acute intracranial abnormality or mass. 2. Unchanged encephalomalacia from prior infarct along the right temporoparietal junction and right corona radiata.      Disposition Plan  :    Status is: Observation  DVT Prophylaxis  :    heparin injection 5,000 Units Start: 05/10/23 0600 SCDs Start: 05/06/23 1531     Lab Results  Component Value Date   PLT 142 (L) 05/12/2023    Diet :  Diet Order             Diet heart healthy/carb modified Room service appropriate? Yes; Fluid consistency: Thin  Diet effective now                    Inpatient Medications  Scheduled Meds:  (feeding supplement) PROSource Plus  30 mL Oral BID BM   atorvastatin  80 mg Oral Daily   B-complex with vitamin C  1 tablet Oral Daily   Chlorhexidine Gluconate Cloth  6 each Topical Daily   docusate sodium  100 mg Oral BID   feeding supplement  237 mL Oral TID BM   ferrous sulfate  325 mg Oral BID WC   finasteride  5 mg Oral Daily   folic acid  1 mg Oral Daily   furosemide  40 mg Intravenous Q12H   gabapentin  300 mg Oral QHS   heparin injection (subcutaneous)  5,000 Units Subcutaneous Q8H   hydrocortisone  10 mg Oral BID   insulin aspart  0-15 Units Subcutaneous TID WC    insulin aspart  0-5 Units Subcutaneous QHS   insulin glargine-yfgn  20 Units Subcutaneous QHS   melatonin  5 mg Oral QHS   midodrine  10 mg Oral TID with meals   nystatin   Topical TID   oxyCODONE  15 mg Oral Q12H   pantoprazole  40 mg Oral Daily   potassium chloride  40 mEq Oral Once   potassium chloride  40 mEq Oral Once   saccharomyces boulardii  250 mg Oral BID   senna-docusate  1 tablet Oral BID   sodium chloride flush  10-40 mL Intracatheter Q12H   tamsulosin  0.4 mg Oral Daily   zinc sulfate  220 mg Oral Daily   Continuous Infusions:  albumin human     DAPTOmycin (CUBICIN) 800 mg in sodium chloride 0.9 % IVPB 132 mL/hr at 05/12/23 0402   meropenem (MERREM) IV 1 g (05/12/23 0634)   PRN Meds:.albuterol, bisacodyl, hydrALAZINE, HYDROmorphone (DILAUDID) injection, LORazepam, oxyCODONE    Objective:   Vitals:   05/12/23 0000 05/12/23 0400 05/12/23 0500 05/12/23 0800  BP: 98/68 103/67  98/78  Pulse: 75 74    Resp: 14 10  11   Temp: 98.2 F (36.8 C) 98 F (36.7 C)  97.9 F (36.6 C)  TempSrc: Oral Oral  Oral  SpO2: 97% 96%    Weight:   115 kg   Height:        Wt Readings from Last 3 Encounters:  05/12/23 115 kg  04/19/23 109.3 kg  04/16/23 110.7 kg     Intake/Output Summary (Last 24 hours) at 05/12/2023 0847 Last data filed at 05/12/2023 0402 Gross per 24 hour  Intake 805.87 ml  Output 1350 ml  Net -544.13 ml     Physical Exam  Awake Alert, No new F.N deficits, Normal affect [place].AT,PERRAL Supple Neck, No JVD,   Symmetrical Chest wall movement, Good air movement bilaterally, CTAB RRR,No Gallops,Rubs or new Murmurs,  +ve B.Sounds, Abd Soft, No tenderness,   2+ anasarca  Scrotum looks stable, sutures in place, foley , L BKA, right arm PICC line  RN pressure injury documentation: Pressure Injury 08/03/22 Buttocks Mid Stage 2 -  Partial thickness loss of dermis presenting as a shallow open injury with a red, pink wound bed without slough. area of red  blanchable and non blanchable, and areas with skin sloughed off (Active)  08/03/22 1513  Location: Buttocks  Location Orientation: Mid  Staging: Stage 2 -  Partial thickness loss of dermis presenting as a shallow open injury with a red, pink wound bed without slough.  Wound Description (Comments): area of red blanchable and non blanchable, and areas with skin sloughed off  Present on Admission: Yes  Dressing Type Foam - Lift dressing to assess site every shift 05/12/23 0400      Data Review:    Recent Labs  Lab 05/08/23 0351 05/09/23 0220 05/10/23 0030 05/11/23 0304 05/12/23 0112  WBC 7.5 8.5 11.0* 8.1 8.8  HGB 8.2* 7.6* 8.2* 7.8* 7.9*  HCT 27.3* 25.7* 26.5* 26.0* 26.1*  PLT 191 139* 162 136* 142*  MCV 82.7 81.8 82.0 80.5 79.3*  MCH 24.8* 24.2* 25.4* 24.1* 24.0*  MCHC 30.0 29.6* 30.9 30.0 30.3  RDW 22.7* 22.6* 22.5* 22.5* 22.5*  LYMPHSABS 0.7 0.8 0.8 0.6* 1.4  MONOABS 0.2 0.3 0.7 0.3 0.9  EOSABS 0.0 0.0 0.2 0.0 0.5  BASOSABS 0.0 0.0 0.0 0.1 0.1    Recent Labs  Lab 05/06/23 0058 05/06/23 0100 05/06/23 0351 05/06/23 0830 05/06/23 0830 05/07/23 0625 05/08/23 0351 05/09/23 0220 05/09/23 1630 05/10/23 0030 05/11/23 0304 05/12/23 0112  NA 133*  --   --  133*  --  135 132* 134*  --  134* 135 134*  K 4.1  --   --  3.8  --  3.9 4.1 3.9  --  3.8 3.5 3.5  CL 104  --   --  104  --  104 104 104  --  100 101 99  CO2 19*  --   --  22  --  23 22 21*  --  24 26 25   ANIONGAP 10  --   --  7  --  8 6 9   --  10 8 10   GLUCOSE 184*  --   --  99  --  134* 192* 191*  --  208* 166* 179*  BUN 20  --   --  17  --  17 19 26*  --  32* 37* 40*  CREATININE 1.10  --   --  1.01  --  0.95 0.91 1.15  --  1.14 0.93 1.03  AST 26  --   --  25  --  26  --   --   --   --   --   --   ALT 16  --   --  16  --  16  --   --   --   --   --   --   ALKPHOS 94  --   --  82  --  85  --   --   --   --   --   --   BILITOT 0.9  --   --  0.8  --  0.8  --   --   --   --   --   --   ALBUMIN 2.4*  --   --  2.3*   --  2.1*  --   --   --   --   --   --   CRP  --   --   --   --   --  2.6* 4.0*  --  1.1* 1.0* 0.6  --   PROCALCITON  --   --   --   --    < > <0.10 <0.10 <0.10  --  <0.10 <0.10 <0.10  LATICACIDVEN  --  2.9* 1.4  --   --   --   --   --   --   --   --   --   TSH  --   --   --   --   --  9.178*  --   --   --   --   --   --   BNP  --   --   --   --   --   --  2,934.6* 3,947.5*  --  >4,500.0* >4,500.0*  --   MG 1.7  --   --  1.6*  --  2.0 1.8 1.8  --  1.7 1.7  --   CALCIUM 8.3*  --   --  8.1*  --  8.2* 7.9* 8.3*  --  8.9 8.9 9.1   < > = values in this interval not displayed.      Recent Labs  Lab 05/06/23 0100 05/06/23 0351 05/06/23 0830 05/07/23 0625 05/08/23 0351 05/09/23 0220 05/09/23 1630 05/10/23 0030 05/11/23 0304 05/12/23 0112  CRP  --   --   --  2.6* 4.0*  --  1.1* 1.0* 0.6  --   PROCALCITON  --   --    < > <0.10 <0.10 <0.10  --  <0.10 <0.10 <0.10  LATICACIDVEN 2.9* 1.4  --   --   --   --   --   --   --   --   TSH  --   --   --  9.178*  --   --   --   --   --   --   BNP  --   --   --   --  2,934.6* 3,947.5*  --  >4,500.0* >4,500.0*  --   MG  --   --    < > 2.0 1.8 1.8  --  1.7 1.7  --   CALCIUM  --   --    < > 8.2* 7.9* 8.3*  --  8.9 8.9 9.1   < > = values in this interval not displayed.   Lab Results  Component Value Date   HGBA1C 7.0 (H) 04/02/2023    No results for input(s): "TSH", "T4TOTAL", "T3FREE", "THYROIDAB" in the last 72 hours.  Invalid input(s): "FREET3"    Radiology Reports No results found.    Signature  -   Susa Raring M.D on 05/12/2023 at 8:47 AM   -  To page go to [email or web address]

## 2023-05-12 NOTE — Progress Notes (Signed)
Physical Therapy Treatment Patient Details Name: Peter Yang MRN: [id number] DOB: September 12, 1956 Today's Date: 05/12/2023   History of Present Illness 67 y.o. male presents to [facility] hospital from [name] on 05/06/2023 with AMS and recent seizure-like activity. EEG notable for mild to moderate encephalopathy, no seizures noted. PMHx:  HTN, CHF, L BKA, CAD, HLD, DM, BPM,  NSTEMI, Rt 5th ray amputation    PT Comments  Pt received in supine, agreeable to therapy session with emphasis on bed mobility, seated balance and therapeutic exercises for UE/LE strengthening. Pt needing up to modA for bed mobility and rolling with use of hospital bed features/rails. Pt with noted L knee contracture, agreeable to continue working on L knee extension and pt able to tolerate sitting EOB >8 mins today with UE support. Pt continues to benefit from PT services to progress toward functional mobility goals.      Assistance Recommended at Discharge Frequent or constant Supervision/Assistance  If plan is discharge home, recommend the following:  Can travel by private vehicle    Two people to help with walking and/or transfers;Two people to help with bathing/dressing/bathroom;Direct supervision/assist for medications management;Direct supervision/assist for financial management;Assist for transportation;Help with stairs or ramp for entrance   No  Equipment Recommendations  None recommended by PT    Recommendations for Other Services       Precautions / Restrictions Precautions Precautions: Fall Precaution Comments: Contact precs; Fear of falls Restrictions Weight Bearing Restrictions: Yes RLE Weight Bearing: Non weight bearing Other Position/Activity Restrictions: pt reports and last admission note states NWB     Mobility  Bed Mobility Overal bed mobility: Needs Assistance Bed Mobility: Rolling, Sidelying to Sit, Sit to Sidelying Rolling: Mod assist Sidelying to sit: Mod assist, HOB elevated      Sit to sidelying: Min assist General bed mobility comments: Using bed rail and pad to assist and with cues for hand placement/technique. Log roll to sit up on L side of bed and for return to supine due to pt c/o low back pain. Pt with hx L shoulder RTC injury ("failed surgery" x2 per his report) so needed +2 totalA for posterior supine scooting toward HOB. Rolling x5 reps to ea side total.    Transfers Overall transfer level: Needs assistance                Lateral/Scoot Transfers: Total assist General transfer comment: With cues for wheelchair push-up technique, pt unable to deweight his hips, attempt x2 to scoot toward L side but needing totalA with poor propulsion scoots. Defer OOB to chair due to lack of +2 assist and concern for shearing if scooting with pt unable to use RLE with NWB status and hx of scrotal sores. RN notified pt will need +2 totalA for hoyer OOB to chair.    Ambulation/Gait               General Gait Details: UTA; pt currently NWB on RLE and does not have L prosthetic     Balance Overall balance assessment: Needs assistance Sitting-balance support: Bilateral upper extremity supported Sitting balance-Leahy Scale: Fair Sitting balance - Comments: Fair static sitting, pt c/o fear of falls; posterior lean with attempts at lateral scooting toward HOB Postural control: Posterior lean     Standing balance comment: UTA; pt NWB and no prosthetic available for other leg                            Cognition  Arousal/Alertness: Awake/alert Behavior During Therapy: WFL for tasks assessed/performed Overall Cognitive Status: No family/caregiver present to determine baseline cognitive functioning                                 General Comments: Pt alert and cooperative and following 1-step commands well this date.        Exercises Other Exercises Other Exercises: supine BLE AROM: knee extension (with x30 sec low load stretch x2 reps  on L knee), hip ab/adduction, SLR x10 reps ea Other Exercises: seated chair push-ups x5 reps (working on elbow extension strengthening pushing bilat on mattress) Other Exercises: bed "crunches" x5 reps ea    General Comments General comments (skin integrity, edema, etc.): BP 122/74 (91) sitting EOB; SpO2 97% on RA, HR 88-90's bpm in supine.      Pertinent Vitals/Pain Pain Assessment Pain Assessment: Faces Faces Pain Scale: Hurts little more Pain Location: bil knees, L shoulder, lower back Pain Descriptors / Indicators: Aching, Discomfort, Grimacing Pain Intervention(s): Monitored during session, Repositioned    Home Living                          Prior Function            PT Goals (current goals can now be found in the care plan section) Acute Rehab PT Goals Patient Stated Goal: ultimately hopes to return to his home PT Goal Formulation: With patient Time For Goal Achievement: 05/21/23 Progress towards PT goals: Progressing toward goals    Frequency    Min 1X/week      PT Plan Current plan remains appropriate       AM-PAC PT "6 Clicks" Mobility   Outcome Measure  Help needed turning from your back to your side while in a flat bed without using bedrails?: A Lot Help needed moving from lying on your back to sitting on the side of a flat bed without using bedrails?: A Lot Help needed moving to and from a bed to a chair (including a wheelchair)?: Total Help needed standing up from a chair using your arms (e.g., wheelchair or bedside chair)?: Total Help needed to walk in hospital room?: Total Help needed climbing 3-5 steps with a railing? : Total 6 Click Score: 8    End of Session   Activity Tolerance: Patient tolerated treatment well Patient left: in bed;with call bell/phone within reach;with bed alarm set;Other (comment) (case mgr entering his room) Nurse Communication: Mobility status;Need for lift equipment;Other (comment) (rec OOB via hoyer) PT  Visit Diagnosis: Muscle weakness (generalized) (M62.81);History of falling (Z91.81)     Time: 6578-4696 PT Time Calculation (min) (ACUTE ONLY): 43 min  Charges:    $Therapeutic Exercise: 8-22 mins $Therapeutic Activity: 23-37 mins PT General Charges $$ ACUTE PT VISIT: 1 Visit                     Kemi Gell P., PTA Acute Rehabilitation Services Secure Chat Preferred 9a-5:30pm Office: [phone]    Dorathy Kinsman [facility] 05/12/2023, 4:23 PM

## 2023-05-13 LAB — CBC WITH DIFFERENTIAL/PLATELET
Abs Immature Granulocytes: 0.04 10*3/uL (ref 0.00–0.07)
Basophils Absolute: 0 10*3/uL (ref 0.0–0.1)
Basophils Relative: 1 %
Eosinophils Absolute: 0.3 10*3/uL (ref 0.0–0.5)
Eosinophils Relative: 4 %
HCT: 25.5 % — ABNORMAL LOW (ref 39.0–52.0)
Hemoglobin: 7.8 g/dL — ABNORMAL LOW (ref 13.0–17.0)
Immature Granulocytes: 1 %
Lymphocytes Relative: 13 %
Lymphs Abs: 1 10*3/uL (ref 0.7–4.0)
MCH: 24.8 pg — ABNORMAL LOW (ref 26.0–34.0)
MCHC: 30.6 g/dL (ref 30.0–36.0)
MCV: 81.2 fL (ref 80.0–100.0)
Monocytes Absolute: 0.6 10*3/uL (ref 0.1–1.0)
Monocytes Relative: 9 %
Neutro Abs: 5.4 10*3/uL (ref 1.7–7.7)
Neutrophils Relative %: 72 %
Platelets: 120 10*3/uL — ABNORMAL LOW (ref 150–400)
RBC: 3.14 MIL/uL — ABNORMAL LOW (ref 4.22–5.81)
RDW: 22.8 % — ABNORMAL HIGH (ref 11.5–15.5)
WBC: 7.4 10*3/uL (ref 4.0–10.5)
nRBC: 0 % (ref 0.0–0.2)

## 2023-05-13 LAB — TYPE AND SCREEN
ABO/RH(D): A POS
Antibody Screen: POSITIVE
DAT, IgG: POSITIVE
Unit division: 0

## 2023-05-13 LAB — BPAM RBC
Blood Product Expiration Date: 202408152359
Unit Type and Rh: 6200
Unit Type and Rh: 6200

## 2023-05-13 LAB — GLUCOSE, CAPILLARY
Glucose-Capillary: 140 mg/dL — ABNORMAL HIGH (ref 70–99)
Glucose-Capillary: 185 mg/dL — ABNORMAL HIGH (ref 70–99)
Glucose-Capillary: 174 mg/dL — ABNORMAL HIGH (ref 70–99)

## 2023-05-13 LAB — BASIC METABOLIC PANEL
Anion gap: 11 (ref 5–15)
BUN: 42 mg/dL — ABNORMAL HIGH (ref 8–23)
CO2: 27 mmol/L (ref 22–32)
Calcium: 9.3 mg/dL (ref 8.9–10.3)
Chloride: 100 mmol/L (ref 98–111)
Creatinine, Ser: 0.97 mg/dL (ref 0.61–1.24)
GFR, Estimated: >60 mL/min (ref >60)
Glucose, Bld: 164 mg/dL — ABNORMAL HIGH (ref 70–99)
Potassium: 3.7 mmol/L (ref 3.5–5.1)
Sodium: 138 mmol/L (ref 135–145)

## 2023-05-13 LAB — MAGNESIUM: Magnesium: 1.8 mg/dL (ref 1.7–2.4)

## 2023-05-13 LAB — CK: Total CK: 38 U/L — ABNORMAL LOW (ref 49–397)

## 2023-05-13 MED ORDER — NYSTATIN 100000 UNIT/GM EX POWD
Freq: Three times a day (TID) | CUTANEOUS | Status: DC
  Filled 2023-05-13: qty 15

## 2023-05-13 MED ORDER — OXYCODONE HCL 5 MG PO TABS: 5.0000 mg | ORAL_TABLET | ORAL | 0 refills | Status: DC | PRN

## 2023-05-13 MED ORDER — FUROSEMIDE 10 MG/ML IJ SOLN
40.0000 mg | Freq: Two times a day (BID) | INTRAMUSCULAR | Status: DC
  Administered 2023-05-13: 40 mg via INTRAVENOUS
  Filled 2023-05-13: qty 4

## 2023-05-13 MED ORDER — MAGNESIUM SULFATE IN D5W 1-5 GM/100ML-% IV SOLN
1.0000 g | Freq: Once | INTRAVENOUS | Status: AC
  Administered 2023-05-13: 1 g via INTRAVENOUS
  Filled 2023-05-13: qty 100

## 2023-05-13 MED ORDER — NYSTATIN 100000 UNIT/GM EX POWD: Freq: Three times a day (TID) | CUTANEOUS | 0 refills | Status: DC

## 2023-05-13 MED ORDER — FOLIC ACID 1 MG PO TABS: 1.0000 mg | ORAL_TABLET | Freq: Every day | ORAL | Status: DC

## 2023-05-13 MED ORDER — OXYCODONE HCL 10 MG PO TABS: 15.0000 mg | ORAL_TABLET | Freq: Two times a day (BID) | ORAL | 0 refills | Status: DC

## 2023-05-13 MED ORDER — POTASSIUM CHLORIDE CRYS ER 20 MEQ PO TBCR
40.0000 meq | EXTENDED_RELEASE_TABLET | Freq: Two times a day (BID) | ORAL | Status: DC
  Administered 2023-05-13: 40 meq via ORAL
  Filled 2023-05-13 (×2): qty 2

## 2023-05-13 MED ORDER — ASCORBIC ACID 500 MG PO TABS: 500.0000 mg | ORAL_TABLET | Freq: Two times a day (BID) | ORAL | Status: AC

## 2023-05-13 MED ORDER — ALBUMIN HUMAN 25 % IV SOLN
50.0000 g | Freq: Two times a day (BID) | INTRAVENOUS | Status: DC
  Administered 2023-05-13: 50 g via INTRAVENOUS
  Filled 2023-05-13: qty 200

## 2023-05-13 MED ORDER — APIXABAN 5 MG PO TABS: 5.0000 mg | ORAL_TABLET | Freq: Two times a day (BID) | ORAL | Status: DC

## 2023-05-13 NOTE — TOC Progression Note (Addendum)
Transition of Care [facility]) - Progression Note    Patient Details  Name: Peter Yang MRN: [id number] Date of Birth: 05-05-1956  Transition of Care [facility]) CM/SW Contact  Mearl Latin, LCSW Phone Number: 05/13/2023, 9:49 AM  Clinical Narrative:    9:49am-CSW made [name] aware that patient is stable for discharge. [name] stated they had to give patient's bed away and one will not be open until possibly Thursday. MD aware.   12pm-[name] able to accept patient today after all. MD updated.    Expected Discharge Plan: Skilled Nursing Facility Barriers to Discharge: Continued Medical Work up  Expected Discharge Plan and Services In-house Referral: Clinical Social Work   Post Acute Care Choice: Skilled Nursing Facility Living arrangements for the past 2 months: Skilled Nursing Facility                                       Social Determinants of Health (SDOH) Interventions SDOH Screenings   Food Insecurity: No Food Insecurity (05/09/2023)  Housing: Low Risk  (05/09/2023)  Transportation Needs: No Transportation Needs (05/09/2023)  Utilities: Not At Risk (05/09/2023)  Financial Resource Strain: Low Risk  (08/13/2018)   Received from [facility] visits prior to 12/21/2022.  Tobacco Use: Low Risk  (05/06/2023)    Readmission Risk Interventions    05/07/2023    4:42 PM 10/24/2022   11:21 AM 09/02/2022   10:36 AM  Readmission Risk Prevention Plan  Transportation Screening Complete Complete Complete  Medication Review Oceanographer)  Complete Complete  PCP or Specialist appointment within 3-5 days of discharge Complete Complete Complete  HRI or Home Care Consult Complete Complete Complete  SW Recovery Care/Counseling Consult Complete Complete Complete  Palliative Care Screening Not Applicable Not Applicable Not Applicable  Skilled Nursing Facility Complete Complete Complete

## 2023-05-13 NOTE — Progress Notes (Signed)
OT Cancellation Note  Patient Details Name: Peter Yang MRN: [id number] DOB: September 16, 1956   Cancelled Treatment:    Reason Eval/Treat Not Completed: (P) Pain limiting ability to participate, Pt states 10/10 pain in groin and R foot, did not want to perform bed level or EOB activities.  Alexis Goodell 05/13/2023, 2:28 PM

## 2023-05-13 NOTE — Discharge Instructions (Signed)
Follow with Primary MD Patient, No Pcp Per in 7 days, also follow-up with the recommended neurologist and ID physician within the next 7 to 10 days.  Get CBC, CMP, magnesium, anemia panel,-  checked next visit with your primary MD or SNF MD    Activity: As tolerated with Full fall precautions use walker/cane & assistance as needed  Disposition SNF  Diet: Heart Healthy low carbohydrate diet, check CBGs q. ACH S, strict 1.5 L fluid restriction per day.   Check your Weight same time everyday, if you gain over 2 pounds, or you develop in leg swelling, experience more shortness of breath or chest pain, call your Primary MD immediately. Follow Cardiac Low Salt Diet and 1.5 lit/day fluid restriction.  Special Instructions: If you have smoked or chewed Tobacco  in the last 2 yrs please stop smoking, stop any regular Alcohol  and or any Recreational drug use.  On your next visit with your primary care physician please Get Medicines reviewed and adjusted.  Please request your Prim.MD to go over all Hospital Tests and Procedure/Radiological results at the follow up, please get all Hospital records sent to your Prim MD by signing hospital release before you go home.  If you experience worsening of your admission symptoms, develop shortness of breath, life threatening emergency, suicidal or homicidal thoughts you must seek medical attention immediately by calling 911 or calling your MD immediately  if symptoms less severe.  You Must read complete instructions/literature along with all the possible adverse reactions/side effects for all the Medicines you take and that have been prescribed to you. Take any new Medicines after you have completely understood and accpet all the possible adverse reactions/side effects.

## 2023-05-13 NOTE — Progress Notes (Signed)
     Subjective: Patient resting on rounds this morning. Amenable to proceeding with catheter exchange.   Objective: Vital signs in last 24 hours: Temp:  [97.9 F (36.6 C)-98.2 F (36.8 C)] 97.9 F (36.6 C) (07/23 0809) Resp:  [12-18] 18 (07/23 0809) BP: (98-111)/(61-78) 98/78 (07/23 0809) SpO2:  [94 %-98 %] 98 % (07/23 0809)  Intake/Output from previous day: 07/22 0701 - 07/23 0700 In: 3 [I.V.:3] Out: 2600 [Urine:2600]  Intake/Output this shift: No intake/output data recorded.  Physical Exam:  General: Alert and oriented CV: No cyanosis Lungs: equal chest rise Abdomen: Soft, NTND, no rebound or guarding Skin: Significant pelvic anasarca and tinea cruris Gu: Foley catheter in place draining clear yellow urine.  Lab Results: Recent Labs    05/11/23 0304 05/12/23 0112 05/13/23 0427  HGB 7.8* 7.9* 7.8*  HCT 26.0* 26.1* 25.5*   BMET Recent Labs    05/12/23 0112 05/13/23 0427  NA 134* 138  K 3.5 3.7  CL 99 100  CO2 25 27  GLUCOSE 179* 164*  BUN 40* 42*  CREATININE 1.03 0.97  CALCIUM 9.1 9.3     Studies/Results: No results found.  Assessment/Plan: #Fourniers gangrene #tinea cruis   Quarter-inch Penrose drain out. Stop SGLT2 Sutures are still in place on both scrotal incisions.  I will leave them for now as the patient's anasarca has returned and these have a high risk for dehiscence.  Pitting edema of scrotum. Nursing to change ABD pads often. Weeping appears to have stopped.  Recommend antifungal powder, elevating scrotum, and diligently keeping dry.  Sutures to stay in place to support extended healing. Ok to return to facility from Urologic perspective. Please reach out as pt nears discharge so that we can get a point of contact at his facility to schedule an appt.   #Urinary Retention  #Hematuria Was finally able to access glans, but foley placement still had to be done blind. See separate procedure note. Will need to be exchanged on or about  8/22. Some hematuria following catheter placement. It has resolved as of this am. Resume Eliquis at discretion of primary team.    #ESBL UTI Daptomycin and Merrem per ID   LOS: 6 days   Elmon Kirschner, NP [facility] Pager: [phone]  05/13/2023, 10:13 AM

## 2023-05-13 NOTE — Discharge Summary (Addendum)
Peter Yang WUJ:[id number] DOB: 08-09-1956 DOA: 05/06/2023  PCP: Patient, No Pcp Per  Admit date: 05/06/2023  Discharge date: 05/13/2023  Admitted From: SNF   Disposition:  SNF   Recommendations for Outpatient Follow-up:   Follow up with PCP in 1-2 weeks  PCP Please obtain BMP/CBC, 2 view CXR in 1week,  (see Discharge instructions)   PCP Please follow up on the following pending results:    Home Health: None   Equipment/Devices: None  Consultations: Urology Discharge Condition: Fair   CODE STATUS: DNR  Diet Recommendation: Heart healthy low carbohydrate diet, 1.5 L fluid restriction.  Check CBGs q. ACH S.  Chief Complaint  Patient presents with   Altered Mental Status     Brief history of present illness from the day of admission and additional interim summary    67 y.o. male with medical history significant for but not limited to CAD, diabetic polyneuropathy with diabetes mellitus type 2, history of NSTEMI, history of stent placement, hyperlipidemia, BPH, history of large bowel perforation, history of PAF, ischemic cardiomyopathy, history of TIA and stroke, history of diabetic foot ulcer, history of BKA on the left as well as other comorbidities including systolic and diastolic combined CHF and LVEF of 35% and grade 2 diastolic dysfunction who was recently hospitalized on 04/01/2023 until 04/18/2023 from his skilled nursing facility urinary retention, Foley catheter placement and subsequent development and finding of Fournier's gangrene.  He was seen by urology underwent surgical debridement twice first on 04/02/2023 and then again on 04/09/2023 by Dr. Marlou Porch, he was placed on IV daptomycin and ertapenem and discharged back to nursing home.  He now comes back with altered mental status.                                                                   Hospital Course   Metabolic encephalopathy with hypotension and some generalized shaking likely due to hypotension and dehydration in the setting of chronic adrenal insufficiency.  Do not think this was a seizure, he has history of adrenal insufficiency is chronically on steroids and was clinically dehydrated and hypotensive, MRI brain and EEG nonacute, increased home dose steroid, hydrated with IV fluids, PT OT and monitor.  This was discussed by neurologist upon admission.  Blood pressure and encephalopathy much improved now at baseline..   Recent history of Fournier's gangrene.  Debridement twice -  first on 04/02/2023 and then again on 04/09/2023 by Dr. Marlou Porch, he was placed on IV daptomycin and ertapenem and discharged back to nursing home ABX end date 05/20/2023 , continue present antibiotics, has Foley catheter and right arm PICC line, urology to see.  Do not think there is an acute change.  Case discussed with urology, catheter was changed again on 05/12/2023 by urology.  He must follow-up  with urology and recommended ID physician within the next 7 to 10 days of discharge.   History of urinary retention.  Foley catheter as above.   Anemia of chronic disease, worse with ongoing Hematuria POA, request held for 3 days, hematuria has completely resolved, H&H has stabilized, resume Eliquis with caution on 05/13/2023, continue PO iron and folic acid, continue to monitor CBC closely.     Paroxysmal atrial fibrillation.  Not on rate controlling agents, on Eliquis , see above.  [place] vas 2 score of greater than 3.   Chronic adrenal insufficiency with hypotension, on chronic steroids.  Continue him at home dose steroids monitor fluid status closely.   Moderate to severe protein calorie malnutrition with massive chronic and recurrent anasarca.  Protein supplements.  Has anasarca chronically, albumin + Lasix continued for the last 5 days so far about 9 L negative.  Use as  needed diuretics at SNF as needed.   Dehydration, hypoperfusion induced lactic acidosis, hypomagnesemia and hyponatremia.  Resolved after IV fluids.  No signs of sepsis.  Low magnesium replaced monitor at SNF.   Chronic combined systolic and diastolic CHF.  EF 35%.  Blood pressure too low for beta-blocker or ACE inhibitor, requiring midodrine to augment blood pressure, currently compensated.  Monitor.     Hypertension.  Currently blood pressure is soft.  Midodrine, higher than home dose steroids and IV fluids.   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  Supportive care.  PT OT.   DM type II.  Continue home regimen check CBGs q. ACH S at SNF and adjust.  Discharge diagnosis     Principal Problem:   Seizure-like activity (HCC) Active Problems:   Altered mental status   AMS (altered mental status)    Discharge instructions    Discharge Instructions     Discharge instructions   Complete by: As directed    Follow with Primary MD Patient, No Pcp Per in 7 days, also follow-up with the recommended neurologist and ID physician within the next 7 to 10 days.  Get CBC, CMP, magnesium, anemia panel,-  checked next visit with your primary MD or SNF MD    Activity: As tolerated with Full fall precautions use walker/cane & assistance as needed  Disposition SNF  Diet: Heart Healthy low carbohydrate diet, check CBGs q. ACH S, strict 1.5 L fluid restriction per day.   Check your Weight same time everyday, if you gain over 2 pounds, or you develop in leg swelling, experience more shortness of breath or chest pain, call your Primary MD immediately. Follow Cardiac Low Salt Diet and 1.5 lit/day fluid restriction.  Special Instructions: If you have smoked or chewed Tobacco  in the last 2 yrs please stop smoking, stop any regular Alcohol  and or any Recreational drug use.  On your next visit with your primary care physician please Get Medicines reviewed and  adjusted.  Please request your Prim.MD to go over all Hospital Tests and Procedure/Radiological results at the follow up, please get all Hospital records sent to your Prim MD by signing hospital release before you go home.  If you experience worsening of your admission symptoms, develop shortness of breath, life threatening emergency, suicidal or homicidal thoughts you must seek medical attention immediately by calling 911 or calling your MD immediately  if symptoms less severe.  You Must read complete instructions/literature along with all the possible adverse reactions/side effects for all the Medicines you take and that have  been prescribed to you. Take any new Medicines after you have completely understood and accpet all the possible adverse reactions/side effects.   Discharge wound care:   Complete by: As directed    Keep the scrotum clean and dry, apply nystatin powder 3 times a day.  Wound care to right foot surgical, right knee full thickness and sacral pressure injury (Stage 3): Cleanse with Vashe wound cleanser Hart [place] [phone]), then cover with Vashe moistened gauze, dry gauze and secure with silicone foam. Change daily.   Increase activity slowly   Complete by: As directed        Discharge Medications   Allergies as of 05/13/2023       Reactions   Sglt2 Inhibitors    Fournier's gangrene        Medication List     STOP taking these medications    insulin aspart 100 UNIT/ML injection Commonly known as: novoLOG   zinc sulfate 220 (50 Zn) MG capsule       TAKE these medications    acetaminophen 325 MG tablet Commonly known as: TYLENOL Take 650 mg by mouth every 4 (four) hours as needed for mild pain or moderate pain. What changed: Another medication with the same name was changed. Make sure you understand how and when to take each.   acetaminophen 500 MG tablet Commonly known as: TYLENOL Take 2 tablets (1,000 mg total) by mouth every 8 (eight) hours. What  changed:  when to take this reasons to take this   albuterol 108 (90 Base) MCG/ACT inhaler Commonly known as: VENTOLIN HFA Inhale 2 puffs into the lungs every 4 (four) hours as needed for wheezing or shortness of breath.   albuterol (2.5 MG/3ML) 0.083% nebulizer solution Commonly known as: PROVENTIL Take 2.5 mg by nebulization every 4 (four) hours as needed for wheezing or shortness of breath.   apixaban 5 MG Tabs tablet Commonly known as: ELIQUIS Take 1 tablet (5 mg total) by mouth 2 (two) times daily.   ascorbic acid 500 MG tablet Commonly known as: VITAMIN C Take 1 tablet (500 mg total) by mouth 2 (two) times daily for 21 days.   atorvastatin 80 MG tablet Commonly known as: LIPITOR Take 0.5 tablets (40 mg total) by mouth every evening. What changed:  how much to take when to take this   B-complex with vitamin C tablet Take 1 tablet by mouth daily.   bisacodyl 10 MG suppository Commonly known as: DULCOLAX Place 1 suppository (10 mg total) rectally daily as needed for moderate constipation. What changed:  when to take this additional instructions   daptomycin IVPB Commonly known as: CUBICIN Inject 800 mg into the vein daily. Indication:  Fournier's gangrene/osteo  First Dose: Yes Last Day of Therapy:  05/20/23- Please pull PICC at completion of IV antibiotics  Labs - Once weekly:  CBC/D, CMP, and CPK Labs - Once weekly: ESR and CRP Method of administration: IV Push Method of administration may be changed at the discretion of home infusion pharmacist based upon assessment of the patient and/or caregiver's ability to self-administer the medication ordered.   diclofenac Sodium 1 % Gel Commonly known as: VOLTAREN Apply 1 g topically daily. Apply to bilateral knees   docusate sodium 100 MG capsule Commonly known as: COLACE Take 1 capsule (100 mg total) by mouth 2 (two) times daily.   ertapenem IVPB Commonly known as: INVANZ Inject 1 g into the vein daily.  Indication:  Fournier's gangrene/osteo  First Dose: Yes Last Day of  Therapy:  05/20/23- Please pull PICC at completion of IV antibiotics  Labs - Once weekly:  CBC/D and CMP, Labs - Once weekly: ESR and CRP Method of administration: Mini-Bag Plus / Gravity Method of administration may be changed at the discretion of home infusion pharmacist based upon assessment of the patient and/or caregiver's ability to self-administer the medication ordered. What changed: when to take this   feeding supplement (PRO-STAT SUGAR FREE 64) Liqd Take 30 mLs by mouth daily.   feeding supplement Liqd Take 237 mLs by mouth 3 (three) times daily between meals.   ferrous sulfate 325 (65 FE) MG tablet Take 1 tablet (325 mg total) by mouth daily with breakfast.   finasteride 5 MG tablet Commonly known as: PROSCAR Take 5 mg by mouth daily.   fluconazole 100 MG tablet Commonly known as: DIFLUCAN Take 100 mg by mouth daily.   folic acid 1 MG tablet Commonly known as: FOLVITE Take 1 tablet (1 mg total) by mouth daily. Start taking on: May 14, 2023   gabapentin 300 MG capsule Commonly known as: NEURONTIN Take 1 capsule (300 mg total) by mouth at bedtime.   HumaLOG KwikPen 100 UNIT/ML KwikPen Generic drug: insulin lispro Inject 0-15 Units into the skin with breakfast, with lunch, and with evening meal. Sliding scale: If 0-100 = 0 units <60 call MD; 101-150= 3 units; 151-200= 4 units; 201-250= 7 units; 251-300= 9 units; 301-350= 12 units; 351-400= 15 units > 400 call MD.   hydrocortisone 5 MG tablet Commonly known as: CORTEF Take 1 tablet (5 mg total) by mouth 2 (two) times daily.   insulin glargine-yfgn 100 UNIT/ML injection Commonly known as: SEMGLEE Inject 0.2 mLs (20 Units total) into the skin at bedtime.   melatonin 5 MG Tabs Take 1 tablet (5 mg total) by mouth daily at 6 PM. What changed: when to take this   midodrine 10 MG tablet Commonly known as: PROAMATINE Take 1 tablet (10 mg total) by  mouth with breakfast, with lunch, and with evening meal.   nystatin powder Commonly known as: MYCOSTATIN/NYSTOP Apply topically 3 (three) times daily.   oxyCODONE 5 MG immediate release tablet Commonly known as: Oxy IR/ROXICODONE Take 1 tablet (5 mg total) by mouth every 4 (four) hours as needed for moderate pain, severe pain or breakthrough pain. What changed: Another medication with the same name was removed. Continue taking this medication, and follow the directions you see here.   Oxycodone HCl 10 MG Tabs Take 1.5 tablets (15 mg total) by mouth every 12 (twelve) hours. What changed: Another medication with the same name was removed. Continue taking this medication, and follow the directions you see here.   OXYGEN Inhale 2 L into the lungs continuous. To maintain stats above 90%  PRN order: use as needed for shortness of breath while laying flat on exertion   pantoprazole 40 MG tablet Commonly known as: PROTONIX Take 1 tablet (40 mg total) by mouth daily.   saccharomyces boulardii 250 MG capsule Commonly known as: FLORASTOR Take 250 mg by mouth 2 (two) times daily.   senna-docusate 8.6-50 MG tablet Commonly known as: Senokot-S Take 1 tablet by mouth 2 (two) times daily.   sodium hypochlorite 0.125 % Soln Commonly known as: DAKIN'S 1/4 STRENGTH Apply 1 Application topically daily.   tamsulosin 0.4 MG Caps capsule Commonly known as: FLOMAX Take 0.4 mg by mouth daily.               Discharge Care Instructions  (From  admission, onward)           Start     Ordered   05/13/23 0000  Discharge wound care:       Comments: Keep the scrotum clean and dry, apply nystatin powder 3 times a day.  Wound care to right foot surgical, right knee full thickness and sacral pressure injury (Stage 3): Cleanse with Vashe wound cleanser [facility] [place] [phone]), then cover with Vashe moistened gauze, dry gauze and secure with silicone foam. Change daily.   05/13/23 1042              Contact information for follow-up providers     Crist Fat, MD. Schedule an appointment as soon as possible for a visit in 1 week(s).   Specialty: Urology Contact information: [address] [place] [address] [phone]         Judyann Munson, MD. Schedule an appointment as soon as possible for a visit in 1 week(s).   Specialty: Infectious Diseases Contact information: [address] [place] [address] [phone]              Contact information for after-discharge care     Destination     [facility] Preferred SNF .   Service: Skilled Nursing Contact information: [address] [place] [address] [phone]                     Major procedures and Radiology Reports - PLEASE review detailed and final reports thoroughly  -      EEG adult  Result Date: 05/06/2023 Charlsie Quest, MD     05/06/2023  1:50 PM Patient Name: Kimber Esterly MRN: [id number] Epilepsy Attending: Charlsie Quest Referring Physician/Provider: Merlene Laughter, DO Date: 05/06/2023 Duration: 23.22 mins Patient history: 67yo M witnessed to be unresponsive at his nursing home with shaking activity that self resolved without any focal deficits noted, and was confused/likely postictal afterwards with gradual recovery to baseline. EEG to evaluate for seizure. Level of alertness: Awake, asleep AEDs during EEG study: None Technical aspects: This EEG study was done with scalp electrodes positioned according to the 10-20 International system of electrode placement. Electrical activity was reviewed with band pass filter of 1-70Hz , sensitivity of 7 uV/mm, display speed of 55mm/sec with a 60Hz  notched filter applied as appropriate. EEG data were recorded continuously and digitally stored.  Video monitoring was available and reviewed as appropriate. Description: The posterior dominant rhythm consists of 7 Hz  activity of moderate voltage (25-35 uV) seen predominantly in posterior head regions, symmetric and reactive to eye opening and eye closing. Sleep was characterized by vertex waves, sleep spindles (12 to 14 Hz), maximal frontocentral region. EEG showed continuous generalized 5 to 7 Hz theta slowing. Physiologic photic driving was seen during photic stimulation. Hyperventilation was not performed.   ABNORMALITY - Continuous slow, generalized - Background slow IMPRESSION: This study is suggestive of mild to moderate diffuse encephalopathy, nonspecific etiology. No seizures or epileptiform discharges were seen throughout the recording. Charlsie Quest   MR BRAIN W WO CONTRAST  Result Date: 05/06/2023 CLINICAL DATA:  Mental status change, unknown cause. EXAM: MRI HEAD WITHOUT AND WITH CONTRAST TECHNIQUE: Multiplanar, multiecho pulse sequences of the brain and surrounding structures were obtained without and with intravenous contrast. CONTRAST:  10mL GADAVIST GADOBUTROL 1 MMOL/ML IV SOLN COMPARISON:  MRI brain 07/28/2021.  Head CT 05/06/2023. FINDINGS: Brain: No acute infarct or hemorrhage. Unchanged encephalomalacia from prior infarct  along the right temporoparietal junction. Encephalomalacia and hemosiderin staining from prior hemorrhage in the right corona radiata. No hydrocephalus or extra-axial collection. No mass or abnormal enhancement. Vascular: Normal flow voids and vessel enhancement. Skull and upper cervical spine: Normal marrow signal and enhancement. Sinuses/Orbits: Unremarkable. Other: None. IMPRESSION: 1. No acute intracranial abnormality or mass. 2. Unchanged encephalomalacia from prior infarct along the right temporoparietal junction and right corona radiata. Electronically Signed   By: Orvan Falconer M.D.   On: 05/06/2023 10:27   CT Head Wo Contrast  Result Date: 05/06/2023 CLINICAL DATA:  Delirium EXAM: CT HEAD WITHOUT CONTRAST TECHNIQUE: Contiguous axial images were obtained from the base of  the skull through the vertex without intravenous contrast. RADIATION DOSE REDUCTION: This exam was performed according to the departmental dose-optimization program which includes automated exposure control, adjustment of the mA and/or kV according to patient size and/or use of iterative reconstruction technique. COMPARISON:  04/19/2023 FINDINGS: Brain: No evidence of acute infarction, hemorrhage, hydrocephalus, extra-axial collection or mass lesion/mass effect. Small chronic cortex infarcts seen along the right cerebral convexity. Generalized brain atrophy. Vascular: No hyperdense vessel or unexpected calcification. Skull: Normal. Negative for fracture or focal lesion. Sinuses/Orbits: No acute finding. IMPRESSION: 1. No acute finding. 2. Small chronic infarcts scattered in the right cerebral cortex. Electronically Signed   By: Tiburcio Pea M.D.   On: 05/06/2023 04:06   DG Chest Portable 1 View  Result Date: 05/06/2023 CLINICAL DATA:  Weakness EXAM: PORTABLE CHEST 1 VIEW COMPARISON:  04/03/2023 FINDINGS: Pulmonary insufflation is stable. Diffusely increased opacity of the right hemithorax likely relates to posteriorly layering small to moderate pleural fluid on this supine examination. Moderate left pleural effusion also noted. Superimposed left basilar atelectasis or infiltrate is present. No definite pneumothorax. Right upper extremity PICC line tip seen at the superior cavoatrial junction. Cardiac size within normal limits. Pulmonary vascularity is normal. No acute bone abnormality. IMPRESSION: 1. Small to moderate bilateral pleural effusions. 2. Left basilar atelectasis or infiltrate. Electronically Signed   By: Helyn Numbers M.D.   On: 05/06/2023 03:26   CT Head Wo Contrast  Result Date: 04/19/2023 CLINICAL DATA:  Minor head trauma.  Fall.  Patient on Eliquis. EXAM: CT HEAD WITHOUT CONTRAST TECHNIQUE: Contiguous axial images were obtained from the base of the skull through the vertex without  intravenous contrast. RADIATION DOSE REDUCTION: This exam was performed according to the departmental dose-optimization program which includes automated exposure control, adjustment of the mA and/or kV according to patient size and/or use of iterative reconstruction technique. COMPARISON:  08/02/2022 FINDINGS: Brain: Ventricles, cisterns and other CSF spaces are within normal. Minimal chronic ischemic microvascular disease somewhat asymmetric to the right frontal parietal region unchanged. No mass, mass effect, shift of midline structures or acute hemorrhage. No acute infarction. Vascular: No hyperdense vessel or unexpected calcification. Skull: Normal. Negative for fracture or focal lesion. Sinuses/Orbits: No acute finding. Other: None. IMPRESSION: 1. No acute findings. 2. Minimal chronic ischemic microvascular disease. Electronically Signed   By: Elberta Fortis M.D.   On: 04/19/2023 08:55    Micro Results    No results found for this or any previous visit (from the past 240 hour(s)).  Today   Subjective    Opie Maclaughlin today has no headache,no chest abdominal pain,no new weakness tingling or numbness, feels much better     Objective   Blood pressure 98/78, pulse 74, temperature 97.9 F (36.6 C), temperature source Oral, resp. rate 18, height 6\' 2"  (1.88 m), weight 115  kg, SpO2 98%.   Intake/Output Summary (Last 24 hours) at 05/13/2023 1110 Last data filed at 05/13/2023 1000 Gross per 24 hour  Intake 243 ml  Output 1700 ml  Net -1457 ml    Exam  Awake Alert, No new F.N deficits,    [place].AT,PERRAL Supple Neck,   Symmetrical Chest wall movement, Good air movement bilaterally, CTAB RRR,No Gallops,   1+ anasarca  Scrotum looks stable, sutures in place, foley , L BKA, right arm PICC line   Data Review   Recent Labs  Lab 05/09/23 0220 05/10/23 0030 05/11/23 0304 05/12/23 0112 05/13/23 0427  WBC 8.5 11.0* 8.1 8.8 7.4  HGB 7.6* 8.2* 7.8* 7.9* 7.8*  HCT 25.7* 26.5* 26.0* 26.1*  25.5*  PLT 139* 162 136* 142* 120*  MCV 81.8 82.0 80.5 79.3* 81.2  MCH 24.2* 25.4* 24.1* 24.0* 24.8*  MCHC 29.6* 30.9 30.0 30.3 30.6  RDW 22.6* 22.5* 22.5* 22.5* 22.8*  LYMPHSABS 0.8 0.8 0.6* 1.4 1.0  MONOABS 0.3 0.7 0.3 0.9 0.6  EOSABS 0.0 0.2 0.0 0.5 0.3  BASOSABS 0.0 0.0 0.1 0.1 0.0    Recent Labs  Lab 05/07/23 0625 05/08/23 0351 05/09/23 0220 05/09/23 1630 05/10/23 0030 05/11/23 0304 05/12/23 0112 05/13/23 0427  NA 135 132* 134*  --  134* 135 134* 138  K 3.9 4.1 3.9  --  3.8 3.5 3.5 3.7  CL 104 104 104  --  100 101 99 100  CO2 23 22 21*  --  24 26 25 27   ANIONGAP 8 6 9   --  10 8 10 11   GLUCOSE 134* 192* 191*  --  208* 166* 179* 164*  BUN 17 19 26*  --  32* 37* 40* 42*  CREATININE 0.95 0.91 1.15  --  1.14 0.93 1.03 0.97  AST 26  --   --   --   --   --   --   --   ALT 16  --   --   --   --   --   --   --   ALKPHOS 85  --   --   --   --   --   --   --   BILITOT 0.8  --   --   --   --   --   --   --   ALBUMIN 2.1*  --   --   --   --   --   --   --   CRP 2.6* 4.0*  --  1.1* 1.0* 0.6  --   --   PROCALCITON <0.10 <0.10 <0.10  --  <0.10 <0.10 <0.10  --   TSH 9.178*  --   --   --   --   --   --   --   BNP  --  2,934.6* 3,947.5*  --  >4,500.0* >4,500.0*  --   --   MG 2.0 1.8 1.8  --  1.7 1.7  --  1.8  CALCIUM 8.2* 7.9* 8.3*  --  8.9 8.9 9.1 9.3    Total Time in preparing paper work, data evaluation and todays exam - 35 minutes  Signature  -    Susa Raring M.D on 05/13/2023 at 11:10 AM   -  To page go to [email or web address]

## 2023-05-13 NOTE — Progress Notes (Signed)
PROGRESS NOTE                                                                                                                                                                                                             Patient Demographics:    Peter Yang, is a 67 y.o. male, DOB - [birth year], DGU:[id number]  Outpatient Primary MD for the patient is Patient, No Pcp Per    LOS - 6  Admit date - 05/06/2023    Chief Complaint  Patient presents with   Altered Mental Status       Brief Narrative (HPI from H&P)    67 y.o. male with medical history significant for but not limited to CAD, diabetic polyneuropathy with diabetes mellitus type 2, history of NSTEMI, history of stent placement, hyperlipidemia, BPH, history of large bowel perforation, history of PAF, ischemic cardiomyopathy, history of TIA and stroke, history of diabetic foot ulcer, history of BKA on the left as well as other comorbidities including systolic and diastolic combined CHF and LVEF of 35% and grade 2 diastolic dysfunction who was recently hospitalized on 04/01/2023 until 04/18/2023 from his skilled nursing facility urinary retention, Foley catheter placement and subsequent development and finding of Fournier's gangrene.  He was seen by urology underwent surgical debridement twice first on 04/02/2023 and then again on 04/09/2023 by Dr. Marlou Porch, he was placed on IV daptomycin and ertapenem and discharged back to nursing home.  He now comes back with altered mental status.   Subjective:   Patient in bed, appears comfortable, denies any headache, no fever, no chest pain or pressure, no shortness of breath , no abdominal pain. No focal weakness.   Assessment  & Plan :   Metabolic encephalopathy with hypotension and some generalized shaking likely due to hypotension and dehydration in the setting of chronic adrenal insufficiency.  Do not think this was a seizure, he has  history of adrenal insufficiency is chronically on steroids and was clinically dehydrated and hypotensive, MRI brain and EEG nonacute, increased home dose steroid, hydrated with IV fluids, PT OT and monitor.  This was discussed by neurologist upon admission.  Blood pressure and encephalopathy much improved.  Recent history of Fournier's gangrene.  Debridement twice -  first on 04/02/2023 and then again on 04/09/2023 by Dr. Marlou Porch, he was placed on IV daptomycin and ertapenem  and discharged back to nursing home ABX end date 05/20/2023 , continue present antibiotics, has Foley catheter and right arm PICC line, urology to see.  Do not think there is an acute change.  Case discussed with urology, catheter was changed again on 05/12/2023 by urology.  History of urinary retention.  Foley catheter as above.  Anemia of chronic disease, worse with ongoing Hematuria POA, request held for 3 days, hematuria has completely resolved, H&H has stabilized, resume Eliquis with caution on 05/13/2023, continue PO iron and folic acid, continue to monitor CBC closely.    Paroxysmal atrial fibrillation.  Not on rate controlling agents, on Eliquis , see above.  [place] vas 2 score of greater than 3.  Chronic adrenal insufficiency with hypotension, on chronic steroids.  Increase steroids, hydrate and monitor.  Moderate to severe protein calorie malnutrition with massive chronic and recurrent anasarca.  Protein supplements.  Has anasarca chronically, albumin + Lasix continued for the last 5 days so far about 9 L negative.  Dehydration, hypoperfusion induced lactic acidosis, hypomagnesemia and hyponatremia.  Resolved after IV fluids.  No signs of sepsis.  Ignacia replaced.  Chronic combined systolic and diastolic CHF.  EF 35%.  Blood pressure too low for beta-blocker or ACE inhibitor, currently compensated.  Monitor.    Hypertension.  Currently blood pressure is soft.  Midodrine, higher than home dose steroids and IV  fluids.  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  Supportive care.  PT OT.  DM type II.  On long-acting insulin along with sliding scale.  Monitor.  Lab Results  Component Value Date   HGBA1C 7.0 (H) 04/02/2023    CBG (last 3)  Recent Labs    05/12/23 1633 05/12/23 2238 05/13/23 0741  GLUCAP 148* 149* 140*         Condition - Extremely Guarded  Family Communication  :  [name] [phone]  - 05/07/23  Code Status :  DNR  Consults  :  Neuro, Urology  PUD Prophylaxis :     Procedures  :     EEG -  This study is suggestive of mild to moderate diffuse encephalopathy, nonspecific etiology. No seizures or epileptiform discharges were seen throughout the recording.  MRI  1. No acute intracranial abnormality or mass. 2. Unchanged encephalomalacia from prior infarct along the right temporoparietal junction and right corona radiata.      Disposition Plan  :    Status is: Observation  DVT Prophylaxis  :    heparin injection 5,000 Units Start: 05/10/23 0600 SCDs Start: 05/06/23 1531     Lab Results  Component Value Date   PLT 120 (L) 05/13/2023    Diet :  Diet Order             Diet heart healthy/carb modified Room service appropriate? Yes; Fluid consistency: Thin  Diet effective now                    Inpatient Medications  Scheduled Meds:  (feeding supplement) PROSource Plus  30 mL Oral BID BM   atorvastatin  80 mg Oral Daily   B-complex with vitamin C  1 tablet Oral Daily   Chlorhexidine Gluconate Cloth  6 each Topical Daily   docusate sodium  100 mg Oral BID   feeding supplement  237 mL Oral TID BM   ferrous sulfate  325 mg Oral BID WC   finasteride  5 mg Oral Daily   folic acid  1 mg Oral Daily   furosemide  40 mg Intravenous Q12H   gabapentin  300 mg Oral QHS   heparin injection (subcutaneous)  5,000 Units Subcutaneous Q8H   hydrocortisone  10 mg Oral BID   insulin aspart  0-15 Units Subcutaneous TID WC    insulin aspart  0-5 Units Subcutaneous QHS   insulin glargine-yfgn  20 Units Subcutaneous QHS   melatonin  5 mg Oral QHS   midodrine  10 mg Oral TID with meals   nystatin   Topical TID   oxyCODONE  15 mg Oral Q12H   pantoprazole  40 mg Oral Daily   potassium chloride  40 mEq Oral BID   saccharomyces boulardii  250 mg Oral BID   senna-docusate  1 tablet Oral BID   sodium chloride flush  10-40 mL Intracatheter Q12H   tamsulosin  0.4 mg Oral Daily   zinc sulfate  220 mg Oral Daily   Continuous Infusions:  albumin human 50 g (05/13/23 0921)   DAPTOmycin (CUBICIN) 800 mg in sodium chloride 0.9 % IVPB 800 mg (05/12/23 1344)   meropenem (MERREM) IV 1 g (05/13/23 0559)   PRN Meds:.albuterol, bisacodyl, hydrALAZINE, HYDROmorphone (DILAUDID) injection, LORazepam, oxyCODONE    Objective:   Vitals:   05/12/23 0800 05/12/23 2000 05/13/23 0000 05/13/23 0809  BP: 98/78 111/72 104/61 98/78  Pulse:      Resp: 11 14 12 18   Temp: 97.9 F (36.6 C) 98.2 F (36.8 C) 98.1 F (36.7 C) 97.9 F (36.6 C)  TempSrc: Oral Oral Oral Oral  SpO2:  94% 95% 98%  Weight:      Height:        Wt Readings from Last 3 Encounters:  05/12/23 115 kg  04/19/23 109.3 kg  04/16/23 110.7 kg     Intake/Output Summary (Last 24 hours) at 05/13/2023 1004 Last data filed at 05/13/2023 0602 Gross per 24 hour  Intake 3 ml  Output 2600 ml  Net -2597 ml     Physical Exam  Awake Alert, No new F.N deficits, Normal affect [place].AT,PERRAL Supple Neck, No JVD,   Symmetrical Chest wall movement, Good air movement bilaterally, CTAB RRR,No Gallops,Rubs or new Murmurs,  +ve B.Sounds, Abd Soft, No tenderness,   2+ anasarca  Scrotum looks stable, sutures in place, foley , L BKA, right arm PICC line  RN pressure injury documentation: Pressure Injury 08/03/22 Buttocks Mid Stage 2 -  Partial thickness loss of dermis presenting as a shallow open injury with a red, pink wound bed without slough. area of red blanchable and  non blanchable, and areas with skin sloughed off (Active)  08/03/22 1513  Location: Buttocks  Location Orientation: Mid  Staging: Stage 2 -  Partial thickness loss of dermis presenting as a shallow open injury with a red, pink wound bed without slough.  Wound Description (Comments): area of red blanchable and non blanchable, and areas with skin sloughed off  Present on Admission: Yes  Dressing Type Foam - Lift dressing to assess site every shift 05/12/23 2000      Data Review:    Recent Labs  Lab 05/09/23 0220 05/10/23 0030 05/11/23 0304 05/12/23 0112 05/13/23 0427  WBC 8.5 11.0* 8.1 8.8 7.4  HGB 7.6* 8.2* 7.8* 7.9* 7.8*  HCT 25.7* 26.5* 26.0* 26.1* 25.5*  PLT 139* 162 136* 142* 120*  MCV 81.8 82.0 80.5 79.3* 81.2  MCH 24.2* 25.4* 24.1* 24.0* 24.8*  MCHC 29.6* 30.9 30.0 30.3 30.6  RDW 22.6* 22.5* 22.5* 22.5*  22.8*  LYMPHSABS 0.8 0.8 0.6* 1.4 1.0  MONOABS 0.3 0.7 0.3 0.9 0.6  EOSABS 0.0 0.2 0.0 0.5 0.3  BASOSABS 0.0 0.0 0.1 0.1 0.0    Recent Labs  Lab 05/07/23 0625 05/08/23 0351 05/09/23 0220 05/09/23 1630 05/10/23 0030 05/11/23 0304 05/12/23 0112 05/13/23 0427  NA 135 132* 134*  --  134* 135 134* 138  K 3.9 4.1 3.9  --  3.8 3.5 3.5 3.7  CL 104 104 104  --  100 101 99 100  CO2 23 22 21*  --  24 26 25 27   ANIONGAP 8 6 9   --  10 8 10 11   GLUCOSE 134* 192* 191*  --  208* 166* 179* 164*  BUN 17 19 26*  --  32* 37* 40* 42*  CREATININE 0.95 0.91 1.15  --  1.14 0.93 1.03 0.97  AST 26  --   --   --   --   --   --   --   ALT 16  --   --   --   --   --   --   --   ALKPHOS 85  --   --   --   --   --   --   --   BILITOT 0.8  --   --   --   --   --   --   --   ALBUMIN 2.1*  --   --   --   --   --   --   --   CRP 2.6* 4.0*  --  1.1* 1.0* 0.6  --   --   PROCALCITON <0.10 <0.10 <0.10  --  <0.10 <0.10 <0.10  --   TSH 9.178*  --   --   --   --   --   --   --   BNP  --  2,934.6* 3,947.5*  --  >4,500.0* >4,500.0*  --   --   MG 2.0 1.8 1.8  --  1.7 1.7  --  1.8  CALCIUM 8.2*  7.9* 8.3*  --  8.9 8.9 9.1 9.3      Recent Labs  Lab 05/07/23 0625 05/08/23 0351 05/09/23 0220 05/09/23 1630 05/10/23 0030 05/11/23 0304 05/12/23 0112 05/13/23 0427  CRP 2.6* 4.0*  --  1.1* 1.0* 0.6  --   --   PROCALCITON <0.10 <0.10 <0.10  --  <0.10 <0.10 <0.10  --   TSH 9.178*  --   --   --   --   --   --   --   BNP  --  2,934.6* 3,947.5*  --  >4,500.0* >4,500.0*  --   --   MG 2.0 1.8 1.8  --  1.7 1.7  --  1.8  CALCIUM 8.2* 7.9* 8.3*  --  8.9 8.9 9.1 9.3   Lab Results  Component Value Date   HGBA1C 7.0 (H) 04/02/2023    No results for input(s): "TSH", "T4TOTAL", "T3FREE", "THYROIDAB" in the last 72 hours.  Invalid input(s): "FREET3"    Radiology Reports No results found.    Signature  -   Susa Raring M.D on 05/13/2023 at 10:04 AM   -  To page go to [email or web address]

## 2023-05-13 NOTE — TOC Transition Note (Addendum)
Transition of Care [facility]) - CM/SW Discharge Note   Patient Details  Name: Peter Yang MRN: [id number] Date of Birth: 1956-04-12  Transition of Care [facility]) CM/SW Contact:  Mearl Latin, LCSW Phone Number: 05/13/2023, 1:47 PM   Clinical Narrative:    Patient will DC to: [name] Anticipated DC date: 05/13/23 Family notified: Pt alerting friend Transport by: Sharin Mons   Per MD patient ready for DC to [name]. RN to call report prior to discharge ([phone] room 611). RN, patient, patient's family, and facility notified of DC. Discharge Summary and FL2 sent to facility. DC packet on chart including signed DNR and script. Ambulance transport requested for patient.   CSW will sign off for now as social work intervention is no longer needed. Please consult [place] again if new needs arise.     Final next level of care: Skilled Nursing Facility Barriers to Discharge: Barriers Resolved   Patient Goals and CMS Choice CMS Medicare.gov Compare Post Acute Care list provided to:: Patient Choice offered to / list presented to : Patient  Discharge Placement     Existing PASRR number confirmed : 05/13/23          Patient chooses bed at: [facility] Patient to be transferred to facility by: [facility]   Patient and family notified of of transfer: 05/13/23  Discharge Plan and Services Additional resources added to the After Visit Summary for   In-house Referral: Clinical Social Work   Post Acute Care Choice: Skilled Nursing Facility                               Social Determinants of Health (SDOH) Interventions SDOH Screenings   Food Insecurity: No Food Insecurity (05/09/2023)  Housing: Low Risk  (05/09/2023)  Transportation Needs: No Transportation Needs (05/09/2023)  Utilities: Not At Risk (05/09/2023)  Financial Resource Strain: Low Risk  (08/13/2018)   Received from [facility] visits prior to 12/21/2022.  Tobacco Use: Low Risk   (05/06/2023)     Readmission Risk Interventions    05/07/2023    4:42 PM 10/24/2022   11:21 AM 09/02/2022   10:36 AM  Readmission Risk Prevention Plan  Transportation Screening Complete Complete Complete  Medication Review [employer])  Complete Complete  PCP or Specialist appointment within 3-5 days of discharge Complete Complete Complete  HRI or Home Care Consult Complete Complete Complete  SW Recovery Care/Counseling Consult Complete Complete Complete  Palliative Care Screening Not Applicable Not Applicable Not Applicable  Skilled Nursing Facility Complete Complete Complete

## 2023-05-13 NOTE — Progress Notes (Addendum)
ANTICOAGULATION CONSULT NOTE - Initial Consult  Pharmacy Consult for apixaban Indication: atrial fibrillation and stroke  Allergies  Allergen Reactions   Sglt2 Inhibitors     Fournier's gangrene    Patient Measurements: Height: 6\' 2"  (188 cm) Weight: 115 kg (253 lb 8.5 oz) IBW/kg (Calculated) : 82.2  Vital Signs: Temp: 97.9 F (36.6 C) (07/23 0809) Temp Source: Oral (07/23 0809) BP: 98/78 (07/23 0809)  Labs: Recent Labs    05/11/23 0304 05/12/23 0112 05/13/23 0427  HGB 7.8* 7.9* 7.8*  HCT 26.0* 26.1* 25.5*  PLT 136* 142* 120*  CREATININE 0.93 1.03 0.97  CKTOTAL  --   --  38*    Estimated Creatinine Clearance: 101 mL/min (by C-G formula based on SCr of 0.97 mg/dL).   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 Overview:  Added automatically from request for surgery [id number] Formatting of this note might be different from the original. Added automatically from request for surgery [id number]   Large bowel perforation (HCC) 09/27/2015   Malignant neoplasm of prostate (HCC) 10/22/2019   Myopia with astigmatism and presbyopia, bilateral 06/15/2018   Last Assessment & Plan:  Formatting of this note might be different from the original. -  Wrx printed Formatting of this note might be different from the original. 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 - Stable, monitor Formatting of this note might be different from the original. Last Assessment & Plan:  - Stable, monitor   Retroperitoneal abscess (HCC) 10/02/2015   Status post corneal transplant 10/07/2016   Formatting of this note might be different from the original. Overview:  Added automatically from request for surgery [id number] Formatting of this note might be different from the original. Added automatically from request for surgery [id number]  Last Assessment & Plan:  Formatting of this note might be different from the original. - OD, 2/2 KCN - Stable, no NVK / rejection - Continue off steroids - Mo   Thoracic ascending aortic aneurysm (HCC)    TIA (transient ischemic attack) 2012   Type 2 diabetes mellitus [facility])    Assessment:  67 year old male with a PMH of Afib, stroke, NSTEMI, diastolic/systolic CHF (EF 84%), anemia presented with altered mental status. PTA Eliquis was held for 3 days due to ongoing hematuria. Hematuria has resolved, Hgb 7.8, Plt 120, stable.  Pharmacy consulted to restart patient's PTA Eliquis.  Plan:  Restart PTA eliquis 5 mg BID, first  dose this evening  Stephenie Acres, PharmD PGY1 Pharmacy Resident 05/13/2023 10:13 AM

## 2023-05-28 ENCOUNTER — Other Ambulatory Visit: Payer: Self-pay

## 2023-05-28 ENCOUNTER — Ambulatory Visit (INDEPENDENT_AMBULATORY_CARE_PROVIDER_SITE_OTHER): Payer: Medicaid Other | Admitting: Internal Medicine

## 2023-05-28 ENCOUNTER — Encounter: Payer: Self-pay | Admitting: Internal Medicine

## 2023-05-28 VITALS — BP 98/66 | HR 70

## 2023-05-28 DIAGNOSIS — N493 Fournier gangrene: Secondary | ICD-10-CM

## 2023-05-28 LAB — SEDIMENTATION RATE: Sed Rate: 6 mm/h (ref 0–20)

## 2023-05-28 NOTE — Patient Instructions (Signed)
Make sure not to miss the appt with dr Marlou Porch at [facility] on August 20th at 10:45 am

## 2023-05-28 NOTE — Progress Notes (Signed)
RFV: follow up for fournier's gangrene  Patient ID: Peter Yang, male   DOB: 1956-02-27, 67 y.o.   MRN: [id number]  HPI [name] is a 67yo M with recent hospitalization for fourniers gangrene, with multiple surgeries plus was discharged on Ertapenem 1 gm IV daily and nd Daptomycin 800 mg IV q24h for 6 weeks from last OR through 7/30. Now off of abtx x 1 week.he still remains dependent on Foley catheter and right arm PICC line, urology still needing to see him back. He was brought in by paramedics from SNF due to being bedbound Outpatient Encounter Medications as of 05/28/2023  Medication Sig   acetaminophen (TYLENOL) 325 MG tablet Take 650 mg by mouth every 4 (four) hours as needed for mild pain or moderate pain.   acetaminophen (TYLENOL) 500 MG tablet Take 2 tablets (1,000 mg total) by mouth every 8 (eight) hours. (Patient taking differently: Take 1,000 mg by mouth every 8 (eight) hours as needed for mild pain or moderate pain.)   albuterol (PROVENTIL) (2.5 MG/3ML) 0.083% nebulizer solution Take 2.5 mg by nebulization every 4 (four) hours as needed for wheezing or shortness of breath.   albuterol (VENTOLIN HFA) 108 (90 Base) MCG/ACT inhaler Inhale 2 puffs into the lungs every 4 (four) hours as needed for wheezing or shortness of breath.   Amino Acids-Protein Hydrolys (FEEDING SUPPLEMENT, PRO-STAT SUGAR FREE 64,) LIQD Take 30 mLs by mouth daily.   apixaban (ELIQUIS) 5 MG TABS tablet Take 1 tablet (5 mg total) by mouth 2 (two) times daily.   ascorbic acid (VITAMIN C) 500 MG tablet Take 1 tablet (500 mg total) by mouth 2 (two) times daily for 21 days.   atorvastatin (LIPITOR) 80 MG tablet Take 0.5 tablets (40 mg total) by mouth every evening. (Patient taking differently: Take 80 mg by mouth daily.)   B Complex-C (B-COMPLEX WITH VITAMIN C) tablet Take 1 tablet by mouth daily.   bisacodyl (DULCOLAX) 10 MG suppository Place 1 suppository (10 mg total) rectally daily as needed for moderate constipation.  (Patient taking differently: Place 10 mg rectally See admin instructions. Every 2-4 hours as needed for constipation.)   daptomycin (CUBICIN) IVPB Inject 800 mg into the vein daily. Indication:  Fournier's gangrene/osteo  First Dose: Yes Last Day of Therapy:  05/20/23- Please pull PICC at completion of IV antibiotics  Labs - Once weekly:  CBC/D, CMP, and CPK Labs - Once weekly: ESR and CRP Method of administration: IV Push Method of administration may be changed at the discretion of home infusion pharmacist based upon assessment of the patient and/or caregiver's ability to self-administer the medication ordered.   diclofenac Sodium (VOLTAREN) 1 % GEL Apply 1 g topically daily. Apply to bilateral knees   docusate sodium (COLACE) 100 MG capsule Take 1 capsule (100 mg total) by mouth 2 (two) times daily.   ertapenem (INVANZ) IVPB Inject 1 g into the vein daily. Indication:  Fournier's gangrene/osteo  First Dose: Yes Last Day of Therapy:  05/20/23- Please pull PICC at completion of IV antibiotics  Labs - Once weekly:  CBC/D and CMP, Labs - Once weekly: ESR and CRP Method of administration: Mini-Bag Plus / Gravity Method of administration may be changed at the discretion of home infusion pharmacist based upon assessment of the patient and/or caregiver's ability to self-administer the medication ordered. (Patient taking differently: Inject 1 g into the vein every evening. Indication:  Fournier's gangrene/osteo  First Dose: Yes Last Day of Therapy:  05/20/23- Please pull PICC at completion  of IV antibiotics  Labs - Once weekly:  CBC/D and CMP, Labs - Once weekly: ESR and CRP Method of administration: Mini-Bag Plus / Gravity Method of administration may be changed at the discretion of home infusion pharmacist based upon assessment of the patient and/or caregiver's ability to self-administer the medication ordered.)   feeding supplement (ENSURE ENLIVE / ENSURE PLUS) LIQD Take 237 mLs by mouth 3 (three)  times daily between meals. (Patient not taking: Reported on 04/02/2023)   ferrous sulfate 325 (65 FE) MG tablet Take 1 tablet (325 mg total) by mouth daily with breakfast.   finasteride (PROSCAR) 5 MG tablet Take 5 mg by mouth daily.   fluconazole (DIFLUCAN) 100 MG tablet Take 100 mg by mouth daily.   folic acid (FOLVITE) 1 MG tablet Take 1 tablet (1 mg total) by mouth daily.   gabapentin (NEURONTIN) 300 MG capsule Take 1 capsule (300 mg total) by mouth at bedtime.   HUMALOG KWIKPEN 100 UNIT/ML KwikPen Inject 0-15 Units into the skin with breakfast, with lunch, and with evening meal. Sliding scale: If 0-100 = 0 units <60 call MD; 101-150= 3 units; 151-200= 4 units; 201-250= 7 units; 251-300= 9 units; 301-350= 12 units; 351-400= 15 units > 400 call MD.   hydrocortisone (CORTEF) 5 MG tablet Take 1 tablet (5 mg total) by mouth 2 (two) times daily.   insulin glargine-yfgn (SEMGLEE) 100 UNIT/ML injection Inject 0.2 mLs (20 Units total) into the skin at bedtime.   melatonin 5 MG TABS Take 1 tablet (5 mg total) by mouth daily at 6 PM. (Patient taking differently: Take 5 mg by mouth at bedtime.)   midodrine (PROAMATINE) 10 MG tablet Take 1 tablet (10 mg total) by mouth with breakfast, with lunch, and with evening meal.   nystatin (MYCOSTATIN/NYSTOP) powder Apply topically 3 (three) times daily.   oxyCODONE (OXY IR/ROXICODONE) 5 MG immediate release tablet Take 1 tablet (5 mg total) by mouth every 4 (four) hours as needed for moderate pain, severe pain or breakthrough pain.   Oxycodone HCl 10 MG TABS Take 1.5 tablets (15 mg total) by mouth every 12 (twelve) hours.   OXYGEN Inhale 2 L into the lungs continuous. To maintain stats above 90%  PRN order: use as needed for shortness of breath while laying flat on exertion   pantoprazole (PROTONIX) 40 MG tablet Take 1 tablet (40 mg total) by mouth daily.   saccharomyces boulardii (FLORASTOR) 250 MG capsule Take 250 mg by mouth 2 (two) times daily.   senna-docusate  (SENOKOT-S) 8.6-50 MG tablet Take 1 tablet by mouth 2 (two) times daily.   sodium hypochlorite (DAKIN'S 1/4 STRENGTH) 0.125 % SOLN Apply 1 Application topically daily.   tamsulosin (FLOMAX) 0.4 MG CAPS capsule Take 0.4 mg by mouth daily.   No facility-administered encounter medications on file as of 05/28/2023.     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) 04/01/2023   Long term current use of systemic steroids 04/01/2023   Thrombocytopenia (HCC) 10/01/2022   Orthostatic hypotension 09/24/2022   HFrEF (heart failure  with reduced ejection fraction) (HCC) 09/23/2022   Counseling and coordination of care 09/15/2022   Pain 09/14/2022   High risk medication use 09/14/2022   Need for emotional support 09/12/2022   Mood altered 09/11/2022   Medication management 09/11/2022   Palliative care by specialist 09/08/2022   Goals of care, counseling/discussion 09/08/2022   Physical deconditioning 09/05/2022   Gross hematuria 08/31/2022   Type 2 diabetes mellitus with diabetic peripheral angiopathy without gangrene, without long-term current use of insulin (HCC)    Impaired functional mobility, balance, gait, and endurance 08/03/2022   Obstructive sleep apnea syndrome 08/03/2022   Pleural effusion, bilateral 08/03/2022   Catheter-associated urinary tract infection (HCC) 08/03/2022   History of urinary retention 08/03/2022   Mitral valve regurgitation 08/03/2022   Thoracic ascending aortic aneurysm (HCC) 07/23/2022   Borderline low blood  pressure determined by examination 07/04/2022   Coronary artery disease due to lipid rich plaque 06/25/2022   Hematuria 06/25/2022   History of non-ST elevation myocardial infarction (NSTEMI) 06/15/2022   PAF (paroxysmal atrial fibrillation) (HCC) 06/15/2022   Dyslipidemia 06/15/2022   PVD (peripheral vascular disease) (HCC) 06/15/2022   Pressure ulcer of sacral region, stage 2 (HCC) 06/15/2022   Chronic HFrEF (heart failure with reduced ejection fraction) (HCC) 06/15/2022   DNR (do not resuscitate) 06/15/2022   Ischemic cardiomyopathy 02/12/2022   Need for assistance with personal care 08/03/2021   Diabetic polyneuropathy associated with type 2 diabetes mellitus (HCC) 07/28/2021   Prolonged QT interval 07/28/2021   Moderate protein-calorie malnutrition (HCC) 06/29/2021   Chronic insomnia 03/31/2021   BPH with obstruction/lower urinary tract symptoms 02/02/2018   Hyperlipidemia 04/15/2016   Vitamin D deficiency 04/09/2016   Essential hypertension 01/25/2016     Health Maintenance Due  Topic Date Due   Pneumonia Vaccine 72+ Years old (1 of 2 - PCV) Never done   FOOT EXAM  Never done   OPHTHALMOLOGY EXAM  Never done   Hepatitis C Screening  Never done   DTaP/Tdap/Td (1 - Tdap) Never done   Colonoscopy  Never done   Zoster Vaccines- Shingrix (2 of 2) 07/27/2020   COVID-19 Vaccine (4 - 2023-24 season) 06/21/2022   INFLUENZA VACCINE  05/22/2023     Review of Systems 12 point ros is negative except what is mentioned above Physical Exam   BP 129/71   Pulse 77   SpO2 92%  Gen = a xo by 3 in nad Skin = fungal skin infection to pannus, and scrotum Scrotum = pressure ulcer from foley catheter; still marked edema to scrotum; sutures in place Ext = right foot ulcer/wound - 25% of wound bed has exudate on edge Lab Results  Component Value Date   LABRPR NON REACTIVE 07/28/2021    CBC Lab Results  Component Value Date   WBC 7.4 05/13/2023   RBC 3.14 (L) 05/13/2023   HGB 7.8  (L) 05/13/2023   HCT 25.5 (L) 05/13/2023   PLT 120 (L) 05/13/2023   MCV 81.2 05/13/2023   MCH 24.8 (L) 05/13/2023   MCHC 30.6 05/13/2023   RDW 22.8 (H) 05/13/2023   LYMPHSABS 1.0 05/13/2023   MONOABS 0.6 05/13/2023   EOSABS 0.3 05/13/2023    BMET Lab Results  Component Value Date   NA 138 05/13/2023   K 3.7 05/13/2023   CL 100 05/13/2023   CO2 27 05/13/2023   GLUCOSE 164 (H) 05/13/2023   BUN 42 (H) 05/13/2023   CREATININE 0.97 05/13/2023   CALCIUM 9.3 05/13/2023   GFRNONAA >60  05/13/2023   Lab Results  Component Value Date   ESRSEDRATE 4 06/04/2023     Assessment and Plan  Will check sed rate and crp;  Will pull picc line. If inflam markers may consider short oral course - did not need to add abtx since inflammatory markers are WNL.  Fourniers gangrene sutures still in place .appears healed but has significant scrotal edema follow up with [facility] on 8/20  Gave recs on moisture barrier and also padding near exit site of foley catheter to not worsen affected area

## 2023-05-28 NOTE — Progress Notes (Signed)
PICC Removal    PICC length & location:  right basilic 43 cm  Removed per verbal order from: Dr. Drue Second  Blood thinners:  Eliquis 5 mg Platelet count:  no recent value available   Site assessment: Dressing clean and dry. Extremity warm and dry. No redness, drainage, or swelling present at insertion site.   Pre-removal vital signs:  BP:  129/71 HR:  77 SpO2:  92%  Insertion site positioned below level of heart. No sutures present. Insertion site cleaned with CHG, catheter removed and petroleum dressing applied. Tip intact. Pressure held until hemostasis achieved.    Length of catheter removed:  43 cm   Provided patient with after care instructions and precautions print out (via [employer]). Reviewed this information with patient.   Patient verbalized understanding and agreement, all questions answered. Patient tolerated procedure well and remained in clinic under the care of RN 30 minutes post removal.  Post-observation vital signs:  BP:  98/66 HR:  70 SpO2:  93%  Notified [facility] pharmacy team of removal. Post-removal instructions sent back with patient to SNF.   Sandie Ano, RN

## 2023-06-04 ENCOUNTER — Emergency Department (HOSPITAL_COMMUNITY): Payer: Medicare Other

## 2023-06-04 ENCOUNTER — Encounter (HOSPITAL_COMMUNITY): Payer: Self-pay | Admitting: Emergency Medicine

## 2023-06-04 ENCOUNTER — Inpatient Hospital Stay (HOSPITAL_COMMUNITY)
Admission: EM | Admit: 2023-06-04 | Discharge: 2023-06-19 | DRG: 919 | Disposition: A | Discharge status: Skilled Nursing Facility | Payer: Medicare Other | Source: Skilled Nursing Facility | Attending: Internal Medicine | Admitting: Internal Medicine

## 2023-06-04 ENCOUNTER — Other Ambulatory Visit: Payer: Self-pay

## 2023-06-04 DIAGNOSIS — G4733 Obstructive sleep apnea (adult) (pediatric): Secondary | ICD-10-CM | POA: Diagnosis present

## 2023-06-04 DIAGNOSIS — D638 Anemia in other chronic diseases classified elsewhere: Secondary | ICD-10-CM | POA: Diagnosis present

## 2023-06-04 DIAGNOSIS — Z66 Do not resuscitate: Secondary | ICD-10-CM | POA: Diagnosis present

## 2023-06-04 DIAGNOSIS — E43 Unspecified severe protein-calorie malnutrition: Secondary | ICD-10-CM | POA: Diagnosis present

## 2023-06-04 DIAGNOSIS — Z79899 Other long term (current) drug therapy: Secondary | ICD-10-CM

## 2023-06-04 DIAGNOSIS — N136 Pyonephrosis: Secondary | ICD-10-CM | POA: Diagnosis present

## 2023-06-04 DIAGNOSIS — Y92122 Bedroom in nursing home as the place of occurrence of the external cause: Secondary | ICD-10-CM | POA: Diagnosis not present

## 2023-06-04 DIAGNOSIS — I5032 Chronic diastolic (congestive) heart failure: Secondary | ICD-10-CM | POA: Diagnosis not present

## 2023-06-04 DIAGNOSIS — W06XXXD Fall from bed, subsequent encounter: Secondary | ICD-10-CM | POA: Diagnosis not present

## 2023-06-04 DIAGNOSIS — W06XXXA Fall from bed, initial encounter: Secondary | ICD-10-CM | POA: Diagnosis present

## 2023-06-04 DIAGNOSIS — Q5564 Hidden penis: Secondary | ICD-10-CM

## 2023-06-04 DIAGNOSIS — R8271 Bacteriuria: Secondary | ICD-10-CM | POA: Diagnosis present

## 2023-06-04 DIAGNOSIS — E1151 Type 2 diabetes mellitus with diabetic peripheral angiopathy without gangrene: Secondary | ICD-10-CM | POA: Diagnosis present

## 2023-06-04 DIAGNOSIS — Z91A98 Caregiver's noncompliance with patient's other medical treatment and regimen for other reason: Secondary | ICD-10-CM

## 2023-06-04 DIAGNOSIS — E2740 Unspecified adrenocortical insufficiency: Secondary | ICD-10-CM | POA: Diagnosis present

## 2023-06-04 DIAGNOSIS — T8141XA Infection following a procedure, superficial incisional surgical site, initial encounter: Secondary | ICD-10-CM | POA: Diagnosis present

## 2023-06-04 DIAGNOSIS — I110 Hypertensive heart disease with heart failure: Secondary | ICD-10-CM | POA: Diagnosis present

## 2023-06-04 DIAGNOSIS — Y846 Urinary catheterization as the cause of abnormal reaction of the patient, or of later complication, without mention of misadventure at the time of the procedure: Secondary | ICD-10-CM | POA: Diagnosis present

## 2023-06-04 DIAGNOSIS — D500 Iron deficiency anemia secondary to blood loss (chronic): Secondary | ICD-10-CM | POA: Diagnosis present

## 2023-06-04 DIAGNOSIS — E440 Moderate protein-calorie malnutrition: Secondary | ICD-10-CM | POA: Diagnosis not present

## 2023-06-04 DIAGNOSIS — B965 Pseudomonas (aeruginosa) (mallei) (pseudomallei) as the cause of diseases classified elsewhere: Secondary | ICD-10-CM | POA: Diagnosis not present

## 2023-06-04 DIAGNOSIS — Z8679 Personal history of other diseases of the circulatory system: Secondary | ICD-10-CM

## 2023-06-04 DIAGNOSIS — Y838 Other surgical procedures as the cause of abnormal reaction of the patient, or of later complication, without mention of misadventure at the time of the procedure: Secondary | ICD-10-CM | POA: Diagnosis present

## 2023-06-04 DIAGNOSIS — F32A Depression, unspecified: Secondary | ICD-10-CM | POA: Diagnosis present

## 2023-06-04 DIAGNOSIS — J90 Pleural effusion, not elsewhere classified: Secondary | ICD-10-CM | POA: Diagnosis present

## 2023-06-04 DIAGNOSIS — Z8546 Personal history of malignant neoplasm of prostate: Secondary | ICD-10-CM

## 2023-06-04 DIAGNOSIS — B356 Tinea cruris: Secondary | ICD-10-CM | POA: Diagnosis present

## 2023-06-04 DIAGNOSIS — Z7401 Bed confinement status: Secondary | ICD-10-CM

## 2023-06-04 DIAGNOSIS — E871 Hypo-osmolality and hyponatremia: Secondary | ICD-10-CM | POA: Diagnosis present

## 2023-06-04 DIAGNOSIS — N493 Fournier gangrene: Secondary | ICD-10-CM | POA: Diagnosis present

## 2023-06-04 DIAGNOSIS — R54 Age-related physical debility: Secondary | ICD-10-CM | POA: Diagnosis present

## 2023-06-04 DIAGNOSIS — Z6832 Body mass index (BMI) 32.0-32.9, adult: Secondary | ICD-10-CM | POA: Diagnosis not present

## 2023-06-04 DIAGNOSIS — I480 Paroxysmal atrial fibrillation: Secondary | ICD-10-CM | POA: Diagnosis present

## 2023-06-04 DIAGNOSIS — L89152 Pressure ulcer of sacral region, stage 2: Secondary | ICD-10-CM | POA: Diagnosis present

## 2023-06-04 DIAGNOSIS — L089 Local infection of the skin and subcutaneous tissue, unspecified: Secondary | ICD-10-CM | POA: Diagnosis present

## 2023-06-04 DIAGNOSIS — R569 Unspecified convulsions: Secondary | ICD-10-CM | POA: Diagnosis not present

## 2023-06-04 DIAGNOSIS — Z955 Presence of coronary angioplasty implant and graft: Secondary | ICD-10-CM

## 2023-06-04 DIAGNOSIS — Z8249 Family history of ischemic heart disease and other diseases of the circulatory system: Secondary | ICD-10-CM

## 2023-06-04 DIAGNOSIS — Z809 Family history of malignant neoplasm, unspecified: Secondary | ICD-10-CM

## 2023-06-04 DIAGNOSIS — I5043 Acute on chronic combined systolic (congestive) and diastolic (congestive) heart failure: Secondary | ICD-10-CM | POA: Diagnosis present

## 2023-06-04 DIAGNOSIS — I2510 Atherosclerotic heart disease of native coronary artery without angina pectoris: Secondary | ICD-10-CM | POA: Diagnosis present

## 2023-06-04 DIAGNOSIS — Z79891 Long term (current) use of opiate analgesic: Secondary | ICD-10-CM

## 2023-06-04 DIAGNOSIS — Z89421 Acquired absence of other right toe(s): Secondary | ICD-10-CM

## 2023-06-04 DIAGNOSIS — I255 Ischemic cardiomyopathy: Secondary | ICD-10-CM | POA: Diagnosis present

## 2023-06-04 DIAGNOSIS — E1142 Type 2 diabetes mellitus with diabetic polyneuropathy: Secondary | ICD-10-CM | POA: Diagnosis present

## 2023-06-04 DIAGNOSIS — T8131XA Disruption of external operation (surgical) wound, not elsewhere classified, initial encounter: Principal | ICD-10-CM | POA: Diagnosis present

## 2023-06-04 DIAGNOSIS — Z7901 Long term (current) use of anticoagulants: Secondary | ICD-10-CM

## 2023-06-04 DIAGNOSIS — W19XXXA Unspecified fall, initial encounter: Principal | ICD-10-CM

## 2023-06-04 DIAGNOSIS — Z833 Family history of diabetes mellitus: Secondary | ICD-10-CM

## 2023-06-04 DIAGNOSIS — Z89512 Acquired absence of left leg below knee: Secondary | ICD-10-CM

## 2023-06-04 DIAGNOSIS — Z961 Presence of intraocular lens: Secondary | ICD-10-CM | POA: Diagnosis present

## 2023-06-04 DIAGNOSIS — E876 Hypokalemia: Secondary | ICD-10-CM | POA: Diagnosis not present

## 2023-06-04 DIAGNOSIS — T83518A Infection and inflammatory reaction due to other urinary catheter, initial encounter: Secondary | ICD-10-CM | POA: Diagnosis present

## 2023-06-04 DIAGNOSIS — M25561 Pain in right knee: Secondary | ICD-10-CM | POA: Diagnosis present

## 2023-06-04 DIAGNOSIS — B961 Klebsiella pneumoniae [K. pneumoniae] as the cause of diseases classified elsewhere: Secondary | ICD-10-CM | POA: Diagnosis present

## 2023-06-04 DIAGNOSIS — Z8631 Personal history of diabetic foot ulcer: Secondary | ICD-10-CM

## 2023-06-04 DIAGNOSIS — Z947 Corneal transplant status: Secondary | ICD-10-CM

## 2023-06-04 DIAGNOSIS — T8131XD Disruption of external operation (surgical) wound, not elsewhere classified, subsequent encounter: Secondary | ICD-10-CM | POA: Diagnosis not present

## 2023-06-04 DIAGNOSIS — R319 Hematuria, unspecified: Secondary | ICD-10-CM | POA: Diagnosis present

## 2023-06-04 DIAGNOSIS — E119 Type 2 diabetes mellitus without complications: Secondary | ICD-10-CM | POA: Diagnosis not present

## 2023-06-04 DIAGNOSIS — Z9889 Other specified postprocedural states: Secondary | ICD-10-CM

## 2023-06-04 DIAGNOSIS — Z8619 Personal history of other infectious and parasitic diseases: Secondary | ICD-10-CM | POA: Diagnosis not present

## 2023-06-04 DIAGNOSIS — Z888 Allergy status to other drugs, medicaments and biological substances status: Secondary | ICD-10-CM

## 2023-06-04 DIAGNOSIS — Z1624 Resistance to multiple antibiotics: Secondary | ICD-10-CM | POA: Diagnosis present

## 2023-06-04 DIAGNOSIS — I252 Old myocardial infarction: Secondary | ICD-10-CM

## 2023-06-04 DIAGNOSIS — Z8719 Personal history of other diseases of the digestive system: Secondary | ICD-10-CM

## 2023-06-04 DIAGNOSIS — E8809 Other disorders of plasma-protein metabolism, not elsewhere classified: Secondary | ICD-10-CM | POA: Diagnosis present

## 2023-06-04 DIAGNOSIS — Z7952 Long term (current) use of systemic steroids: Secondary | ICD-10-CM

## 2023-06-04 DIAGNOSIS — E1152 Type 2 diabetes mellitus with diabetic peripheral angiopathy with gangrene: Secondary | ICD-10-CM | POA: Diagnosis present

## 2023-06-04 DIAGNOSIS — G894 Chronic pain syndrome: Secondary | ICD-10-CM | POA: Diagnosis present

## 2023-06-04 DIAGNOSIS — O901 Disruption of perineal obstetric wound: Secondary | ICD-10-CM | POA: Diagnosis present

## 2023-06-04 DIAGNOSIS — N360 Urethral fistula: Secondary | ICD-10-CM | POA: Diagnosis not present

## 2023-06-04 DIAGNOSIS — E785 Hyperlipidemia, unspecified: Secondary | ICD-10-CM | POA: Diagnosis present

## 2023-06-04 DIAGNOSIS — Z794 Long term (current) use of insulin: Secondary | ICD-10-CM | POA: Diagnosis not present

## 2023-06-04 DIAGNOSIS — N1330 Unspecified hydronephrosis: Secondary | ICD-10-CM | POA: Diagnosis not present

## 2023-06-04 DIAGNOSIS — N492 Inflammatory disorders of scrotum: Secondary | ICD-10-CM | POA: Diagnosis present

## 2023-06-04 DIAGNOSIS — T8149XD Infection following a procedure, other surgical site, subsequent encounter: Secondary | ICD-10-CM | POA: Diagnosis not present

## 2023-06-04 DIAGNOSIS — R310 Gross hematuria: Secondary | ICD-10-CM | POA: Diagnosis not present

## 2023-06-04 DIAGNOSIS — Z8673 Personal history of transient ischemic attack (TIA), and cerebral infarction without residual deficits: Secondary | ICD-10-CM

## 2023-06-04 DIAGNOSIS — M1711 Unilateral primary osteoarthritis, right knee: Secondary | ICD-10-CM | POA: Diagnosis present

## 2023-06-04 DIAGNOSIS — Q6612 Congenital talipes calcaneovarus, left foot: Secondary | ICD-10-CM

## 2023-06-04 DIAGNOSIS — E509 Vitamin A deficiency, unspecified: Secondary | ICD-10-CM | POA: Diagnosis present

## 2023-06-04 DIAGNOSIS — N390 Urinary tract infection, site not specified: Secondary | ICD-10-CM | POA: Diagnosis present

## 2023-06-04 DIAGNOSIS — B9562 Methicillin resistant Staphylococcus aureus infection as the cause of diseases classified elsewhere: Secondary | ICD-10-CM | POA: Diagnosis present

## 2023-06-04 DIAGNOSIS — N401 Enlarged prostate with lower urinary tract symptoms: Secondary | ICD-10-CM | POA: Diagnosis present

## 2023-06-04 LAB — COMPREHENSIVE METABOLIC PANEL
ALT: 12 U/L (ref 0–44)
AST: 19 U/L (ref 15–41)
Albumin: 3 g/dL — ABNORMAL LOW (ref 3.5–5.0)
Alkaline Phosphatase: 85 U/L (ref 38–126)
Anion gap: 11 (ref 5–15)
BUN: 14 mg/dL (ref 8–23)
CO2: 21 mmol/L — ABNORMAL LOW (ref 22–32)
Calcium: 8.5 mg/dL — ABNORMAL LOW (ref 8.9–10.3)
Chloride: 102 mmol/L (ref 98–111)
Creatinine, Ser: 0.92 mg/dL (ref 0.61–1.24)
GFR, Estimated: >60 mL/min (ref >60)
Glucose, Bld: 166 mg/dL — ABNORMAL HIGH (ref 70–99)
Potassium: 3.8 mmol/L (ref 3.5–5.1)
Sodium: 134 mmol/L — ABNORMAL LOW (ref 135–145)
Total Bilirubin: 1.1 mg/dL (ref 0.3–1.2)
Total Protein: 6.3 g/dL — ABNORMAL LOW (ref 6.5–8.1)

## 2023-06-04 LAB — BRAIN NATRIURETIC PEPTIDE: B Natriuretic Peptide: 2911.5 pg/mL — ABNORMAL HIGH (ref 0.0–100.0)

## 2023-06-04 LAB — URINALYSIS, W/ REFLEX TO CULTURE (INFECTION SUSPECTED)
RBC / HPF: >50 RBC/hpf (ref 0–5)
WBC, UA: >50 WBC/hpf (ref 0–5)

## 2023-06-04 LAB — CBC WITH DIFFERENTIAL/PLATELET
Abs Immature Granulocytes: 0 10*3/uL (ref 0.00–0.07)
Basophils Absolute: 0 10*3/uL (ref 0.0–0.1)
Basophils Relative: 0 %
Eosinophils Absolute: 0.6 10*3/uL — ABNORMAL HIGH (ref 0.0–0.5)
Eosinophils Relative: 6 %
HCT: 38.1 % — ABNORMAL LOW (ref 39.0–52.0)
Hemoglobin: 11.3 g/dL — ABNORMAL LOW (ref 13.0–17.0)
Lymphocytes Relative: 12 %
Lymphs Abs: 1.1 10*3/uL (ref 0.7–4.0)
MCH: 26.3 pg (ref 26.0–34.0)
MCHC: 29.7 g/dL — ABNORMAL LOW (ref 30.0–36.0)
MCV: 88.6 fL (ref 80.0–100.0)
Monocytes Absolute: 0.5 10*3/uL (ref 0.1–1.0)
Monocytes Relative: 5 %
Neutro Abs: 7.2 10*3/uL (ref 1.7–7.7)
Neutrophils Relative %: 77 %
Platelets: 197 10*3/uL (ref 150–400)
RBC: 4.3 MIL/uL (ref 4.22–5.81)
RDW: 25.2 % — ABNORMAL HIGH (ref 11.5–15.5)
WBC: 9.3 10*3/uL (ref 4.0–10.5)
nRBC: 0 % (ref 0.0–0.2)
nRBC: 1 /100 WBC — ABNORMAL HIGH

## 2023-06-04 LAB — APTT: aPTT: 37 seconds — ABNORMAL HIGH (ref 24–36)

## 2023-06-04 LAB — I-STAT CG4 LACTIC ACID, ED
Lactic Acid, Venous: 2.1 mmol/L (ref 0.5–1.9)
Lactic Acid, Venous: 1.8 mmol/L (ref 0.5–1.9)

## 2023-06-04 LAB — GLUCOSE, CAPILLARY
Glucose-Capillary: 159 mg/dL — ABNORMAL HIGH (ref 70–99)
Glucose-Capillary: 138 mg/dL — ABNORMAL HIGH (ref 70–99)

## 2023-06-04 LAB — C-REACTIVE PROTEIN: CRP: 2.9 mg/dL — ABNORMAL HIGH (ref <1.0)

## 2023-06-04 LAB — PROCALCITONIN: Procalcitonin: <0.1 ng/mL

## 2023-06-04 LAB — SEDIMENTATION RATE: Sed Rate: 4 mm/hr (ref 0–16)

## 2023-06-04 LAB — PROTIME-INR
INR: 1.7 — ABNORMAL HIGH (ref 0.8–1.2)
Prothrombin Time: 20.5 seconds — ABNORMAL HIGH (ref 11.4–15.2)

## 2023-06-04 MED ORDER — PIPERACILLIN-TAZOBACTAM 3.375 G IVPB 30 MIN
3.3750 g | Freq: Once | INTRAVENOUS | Status: AC
  Administered 2023-06-04: 3.375 g via INTRAVENOUS
  Filled 2023-06-04: qty 50

## 2023-06-04 MED ORDER — VANCOMYCIN HCL IN DEXTROSE 1-5 GM/200ML-% IV SOLN
1000.0000 mg | Freq: Once | INTRAVENOUS | Status: AC
  Administered 2023-06-05: 1000 mg via INTRAVENOUS
  Filled 2023-06-04: qty 200

## 2023-06-04 MED ORDER — ESCITALOPRAM OXALATE 10 MG PO TABS
10.0000 mg | ORAL_TABLET | Freq: Every day | ORAL | Status: DC
  Administered 2023-06-04 – 2023-06-19 (×15): 10 mg via ORAL
  Filled 2023-06-04 (×15): qty 1

## 2023-06-04 MED ORDER — ACETAMINOPHEN 325 MG PO TABS
650.0000 mg | ORAL_TABLET | Freq: Four times a day (QID) | ORAL | Status: DC | PRN
  Administered 2023-06-04 – 2023-06-11 (×2): 650 mg via ORAL
  Filled 2023-06-04 (×3): qty 2

## 2023-06-04 MED ORDER — OXYCODONE HCL 5 MG PO TABS
5.0000 mg | ORAL_TABLET | ORAL | Status: DC | PRN
  Administered 2023-06-06 – 2023-06-08 (×6): 5 mg via ORAL
  Filled 2023-06-04 (×6): qty 1

## 2023-06-04 MED ORDER — TAMSULOSIN HCL 0.4 MG PO CAPS
0.4000 mg | ORAL_CAPSULE | Freq: Every day | ORAL | Status: DC
  Administered 2023-06-04 – 2023-06-19 (×15): 0.4 mg via ORAL
  Filled 2023-06-04 (×15): qty 1

## 2023-06-04 MED ORDER — DOCUSATE SODIUM 100 MG PO CAPS
100.0000 mg | ORAL_CAPSULE | Freq: Two times a day (BID) | ORAL | Status: DC
  Administered 2023-06-04 – 2023-06-05 (×2): 100 mg via ORAL
  Filled 2023-06-04 (×2): qty 1

## 2023-06-04 MED ORDER — MIDODRINE HCL 5 MG PO TABS
10.0000 mg | ORAL_TABLET | Freq: Three times a day (TID) | ORAL | Status: DC
  Administered 2023-06-05 – 2023-06-19 (×41): 10 mg via ORAL
  Filled 2023-06-04 (×42): qty 2

## 2023-06-04 MED ORDER — OXYCODONE HCL ER 15 MG PO T12A
15.0000 mg | EXTENDED_RELEASE_TABLET | Freq: Two times a day (BID) | ORAL | Status: DC
  Administered 2023-06-04 – 2023-06-19 (×29): 15 mg via ORAL
  Filled 2023-06-04 (×29): qty 1

## 2023-06-04 MED ORDER — CHLORHEXIDINE GLUCONATE CLOTH 2 % EX PADS
6.0000 | MEDICATED_PAD | Freq: Every day | CUTANEOUS | Status: DC
  Administered 2023-06-04 – 2023-06-05 (×2): 6 via TOPICAL

## 2023-06-04 MED ORDER — INSULIN GLARGINE-YFGN 100 UNIT/ML ~~LOC~~ SOLN
20.0000 [IU] | Freq: Every day | SUBCUTANEOUS | Status: DC
  Administered 2023-06-04 – 2023-06-10 (×7): 20 [IU] via SUBCUTANEOUS
  Filled 2023-06-04 (×8): qty 0.2

## 2023-06-04 MED ORDER — NYSTATIN 100000 UNIT/GM EX POWD
Freq: Two times a day (BID) | CUTANEOUS | Status: DC
  Administered 2023-06-14: 1 via TOPICAL
  Filled 2023-06-04 (×2): qty 15

## 2023-06-04 MED ORDER — ATORVASTATIN CALCIUM 40 MG PO TABS
40.0000 mg | ORAL_TABLET | Freq: Every evening | ORAL | Status: DC
  Administered 2023-06-04 – 2023-06-18 (×15): 40 mg via ORAL
  Filled 2023-06-04 (×16): qty 1

## 2023-06-04 MED ORDER — FERROUS SULFATE 325 (65 FE) MG PO TABS
325.0000 mg | ORAL_TABLET | Freq: Every day | ORAL | Status: DC
  Administered 2023-06-05 – 2023-06-19 (×14): 325 mg via ORAL
  Filled 2023-06-04 (×14): qty 1

## 2023-06-04 MED ORDER — ACETAMINOPHEN 650 MG RE SUPP: 650.0000 mg | Freq: Four times a day (QID) | RECTAL | Status: DC | PRN

## 2023-06-04 MED ORDER — PANTOPRAZOLE SODIUM 40 MG PO TBEC
40.0000 mg | DELAYED_RELEASE_TABLET | Freq: Every day | ORAL | Status: DC
  Administered 2023-06-04 – 2023-06-19 (×15): 40 mg via ORAL
  Filled 2023-06-04 (×15): qty 1

## 2023-06-04 MED ORDER — ACETAMINOPHEN 500 MG PO TABS
1000.0000 mg | ORAL_TABLET | Freq: Three times a day (TID) | ORAL | Status: DC
  Administered 2023-06-04 – 2023-06-19 (×42): 1000 mg via ORAL
  Filled 2023-06-04 (×42): qty 2

## 2023-06-04 MED ORDER — ALBUTEROL SULFATE (2.5 MG/3ML) 0.083% IN NEBU
2.5000 mg | INHALATION_SOLUTION | RESPIRATORY_TRACT | Status: DC | PRN
  Administered 2023-06-11 – 2023-06-18 (×8): 2.5 mg via RESPIRATORY_TRACT
  Filled 2023-06-04 (×9): qty 3

## 2023-06-04 MED ORDER — HYDROCORTISONE 5 MG PO TABS
5.0000 mg | ORAL_TABLET | Freq: Two times a day (BID) | ORAL | Status: DC
  Administered 2023-06-04 – 2023-06-19 (×29): 5 mg via ORAL
  Filled 2023-06-04 (×31): qty 1

## 2023-06-04 MED ORDER — VANCOMYCIN HCL 1500 MG/300ML IV SOLN
1500.0000 mg | Freq: Once | INTRAVENOUS | Status: AC
  Administered 2023-06-05: 1500 mg via INTRAVENOUS
  Filled 2023-06-04 (×2): qty 300

## 2023-06-04 MED ORDER — FUROSEMIDE 10 MG/ML IJ SOLN
40.0000 mg | Freq: Two times a day (BID) | INTRAMUSCULAR | Status: DC
  Administered 2023-06-04 – 2023-06-13 (×19): 40 mg via INTRAVENOUS
  Filled 2023-06-04 (×21): qty 4

## 2023-06-04 MED ORDER — MEDIHONEY WOUND/BURN DRESSING EX PSTE
1.0000 | PASTE | Freq: Every day | CUTANEOUS | Status: DC
  Administered 2023-06-05 – 2023-06-19 (×13): 1 via TOPICAL
  Filled 2023-06-04 (×2): qty 44

## 2023-06-04 MED ORDER — INSULIN ASPART 100 UNIT/ML IJ SOLN
0.0000 [IU] | Freq: Three times a day (TID) | INTRAMUSCULAR | Status: DC
  Administered 2023-06-04 – 2023-06-07 (×5): 1 [IU] via SUBCUTANEOUS
  Administered 2023-06-08: 3 [IU] via SUBCUTANEOUS
  Administered 2023-06-08: 1 [IU] via SUBCUTANEOUS
  Administered 2023-06-09 (×2): 2 [IU] via SUBCUTANEOUS
  Administered 2023-06-10 (×2): 1 [IU] via SUBCUTANEOUS
  Administered 2023-06-11: 2 [IU] via SUBCUTANEOUS
  Administered 2023-06-11: 1 [IU] via SUBCUTANEOUS
  Administered 2023-06-11: 2 [IU] via SUBCUTANEOUS
  Administered 2023-06-12: 1 [IU] via SUBCUTANEOUS

## 2023-06-04 MED ORDER — IOHEXOL 350 MG/ML SOLN
75.0000 mL | Freq: Once | INTRAVENOUS | Status: AC | PRN
  Administered 2023-06-04: 75 mL via INTRAVENOUS

## 2023-06-04 MED ORDER — SACCHAROMYCES BOULARDII 250 MG PO CAPS
250.0000 mg | ORAL_CAPSULE | Freq: Two times a day (BID) | ORAL | Status: DC
  Administered 2023-06-04 – 2023-06-19 (×29): 250 mg via ORAL
  Filled 2023-06-04 (×29): qty 1

## 2023-06-04 MED ORDER — FINASTERIDE 5 MG PO TABS
5.0000 mg | ORAL_TABLET | Freq: Every day | ORAL | Status: DC
  Administered 2023-06-04 – 2023-06-19 (×15): 5 mg via ORAL
  Filled 2023-06-04 (×15): qty 1

## 2023-06-04 NOTE — ED Notes (Signed)
ED TO INPATIENT HANDOFF REPORT  ED Nurse Name and Phone #: JOsh  S Name/Age/Gender Peter Yang 67 y.o. male Room/Bed: 015C/015C  Code Status   Code Status: Prior  Home/SNF/Other Skilled nursing facility Patient oriented to: self, place, time, and situation Is this baseline? Yes   Triage Complete: Triage complete  Chief Complaint Wound dehiscence in puerperium, perineal [O90.1]  Triage Note Pt BIB [facility] from [facility] rehab. Pts remote fell off the bed and pt was reaching to get it. Pt hit head on the night stand. Pt takes eliquis. Denies CP/SOB   Allergies Allergies  Allergen Reactions   Sglt2 Inhibitors     Fournier's gangrene    Level of Care/Admitting Diagnosis ED Disposition     ED Disposition  Admit   Condition  --   Comment  Hospital Area: [facility] [[id number]]  Level of Care: Telemetry Medical [104]  May admit patient to [facility] or [facility] if equivalent level of care is available:: No  Covid Evaluation: Asymptomatic - no recent exposure (last 10 days) testing not required  Diagnosis: Wound dehiscence in puerperium, perineal [782956]  Admitting Physician: Lurline Del [[id number]]  Attending Physician: Lurline Del [[id number]]  Certification:: I certify this patient will need inpatient services for at least 2 midnights  Expected Medical Readiness: 06/09/2023          B Medical/Surgery History 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 Overview:  Added automatically from request for surgery [id number] Formatting of this note might be different from the original. Added automatically from request for surgery [id number]   Large bowel perforation (HCC) 09/27/2015   Malignant neoplasm of prostate (HCC) 10/22/2019   Myopia with astigmatism and presbyopia, bilateral 06/15/2018   Last Assessment & Plan:  Formatting of this note might be different from the original. - Wrx printed Formatting of this note might be different from the original. 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 - Stable, monitor Formatting of this note might be different from the original. Last Assessment & Plan:  - Stable, monitor   Retroperitoneal abscess (HCC) 10/02/2015   Status post corneal transplant 10/07/2016   Formatting of this note might be different from the original. Overview:  Added automatically from request for surgery [id number] Formatting of this note might be different from the original. Added automatically from request for surgery [id number]  Last Assessment & Plan:  Formatting of this note might  be different from the original. - OD, 2/2 K[facility] - Stable, no NVK / rejection - Continue off steroids - Mo   Thoracic ascending aortic aneurysm (HCC)    TIA (transient ischemic attack) 2012   Type 2 diabetes mellitus [facility])    Past Surgical History:  Procedure Laterality Date   BELOW KNEE LEG  AMPUTATION Left 2022   CORONARY ANGIOPLASTY WITH STENT PLACEMENT  01/2022   DES RCA   IRRIGATION AND DEBRIDEMENT ABSCESS N/A 04/02/2023   Procedure: IRRIGATION AND DEBRIDEMENT ABSCESS;  Surgeon: Crist Fat, MD;  Location: [facility] OR;  Service: Urology;  Laterality: N/A;   IRRIGATION AND DEBRIDEMENT ABSCESS N/A 04/09/2023   Procedure: SCROTAL WASHOUT AND DEBRIDEMENT WITH WOUND CLOSURE;  Surgeon: Crist Fat, MD;  Location: [facility] OR;  Service: Urology;  Laterality: N/A;  60 MINUTES   LEFT HEART CATH AND CORONARY ANGIOGRAPHY N/A 06/17/2022   Procedure: LEFT HEART CATH AND CORONARY ANGIOGRAPHY;  Surgeon: Yvonne Kendall, MD;  Location: [facility] INVASIVE CV LAB;  Service: Cardiovascular;  Laterality: N/A;   ROTATOR CUFF REPAIR Left      A IV Location/Drains/Wounds Patient Lines/Drains/Airways Status     Active Line/Drains/Airways     Name Placement date Placement time Site Days   Peripheral IV 06/04/23 20 G 1.88" Right;Upper Arm 06/04/23  0645  Arm  less than 1   Urethral Catheter Elmon Kirschner, NP Coude 18 Fr. 05/12/23  1132  Coude  23   Pressure Injury 08/03/22 Buttocks Mid Stage 2 -  Partial thickness loss of dermis presenting as a shallow open injury with a red, pink wound bed without slough. area of red blanchable and non blanchable, and areas with skin sloughed off 08/03/22  1513  -- 305   Wound / Incision (Open or Dehisced) 04/02/23 Diabetic ulcer Knee Anterior;Right 04/02/23  0103  Knee  63   Wound / Incision (Open or Dehisced) 04/02/23 Skin tear Penis 04/02/23  0108  Penis  63   Wound / Incision (Open or Dehisced) 04/02/23 Amputation Toe (Comment  which one) Anterior;Right 04/02/23  0830  Toe (Comment  which one)  63   Wound / Incision (Open or Dehisced) Buttocks Right --  --  Buttocks  --   Wound / Incision (Open or Dehisced) 05/11/23 Incision - Open Scrotum Left;Right;Lateral 05/11/23  2000  Scrotum  24            Intake/Output Last 24 hours No intake or output data in  the 24 hours ending 06/04/23 1059  Labs/Imaging Results for orders placed or performed during the hospital encounter of 06/04/23 (from the past 48 hour(s))  CBC with Differential     Status: Abnormal   Collection Time: 06/04/23  6:44 AM  Result Value Ref Range   WBC 9.3 4.0 - 10.5 K/uL   RBC 4.30 4.22 - 5.81 MIL/uL   Hemoglobin 11.3 (L) 13.0 - 17.0 g/dL   HCT 31.5 (L) 17.6 - 16.0 %   MCV 88.6 80.0 - 100.0 fL   MCH 26.3 26.0 - 34.0 pg   MCHC 29.7 (L) 30.0 - 36.0 g/dL   RDW 73.7 (H) 10.6 - 26.9 %   Platelets 197 150 - 400 K/uL   nRBC 0.0 0.0 - 0.2 %   Neutrophils Relative % 77 %   Neutro Abs 7.2 1.7 - 7.7 K/uL   Lymphocytes Relative 12 %   Lymphs Abs 1.1 0.7 - 4.0 K/uL   Monocytes Relative 5 %   Monocytes Absolute 0.5 0.1 -  1.0 K/uL   Eosinophils Relative 6 %   Eosinophils Absolute 0.6 (H) 0.0 - 0.5 K/uL   Basophils Relative 0 %   Basophils Absolute 0.0 0.0 - 0.1 K/uL   nRBC 1 (H) 0 /100 WBC   Abs Immature Granulocytes 0.00 0.00 - 0.07 K/uL   Polychromasia PRESENT     Comment: Performed at [facility], [address] [place], [place] [address]  Comprehensive metabolic panel     Status: Abnormal   Collection Time: 06/04/23  6:46 AM  Result Value Ref Range   Sodium 134 (L) 135 - 145 mmol/L   Potassium 3.8 3.5 - 5.1 mmol/L   Chloride 102 98 - 111 mmol/L   CO2 21 (L) 22 - 32 mmol/L   Glucose, Bld 166 (H) 70 - 99 mg/dL    Comment: Glucose reference range applies only to samples taken after fasting for at least 8 hours.   BUN 14 8 - 23 mg/dL   Creatinine, Ser 0.93 0.61 - 1.24 mg/dL   Calcium 8.5 (L) 8.9 - 10.3 mg/dL   Total Protein 6.3 (L) 6.5 - 8.1 g/dL   Albumin 3.0 (L) 3.5 - 5.0 g/dL   AST 19 15 - 41 U/L   ALT 12 0 - 44 U/L   Alkaline Phosphatase 85 38 - 126 U/L   Total Bilirubin 1.1 0.3 - 1.2 mg/dL   GFR, Estimated >81 >82 mL/min    Comment: (NOTE) Calculated using the CKD-EPI Creatinine Equation (2021)    Anion gap 11 5 - 15    Comment: Performed at [facility], [address], [place], [place] [address]  Protime-INR     Status: Abnormal   Collection Time: 06/04/23  6:46 AM  Result Value Ref Range   Prothrombin Time 20.5 (H) 11.4 - 15.2 seconds   INR 1.7 (H) 0.8 - 1.2    Comment: (NOTE) INR goal varies based on device and disease states. Performed at [facility], [address] [place], [place] [address]   APTT     Status: Abnormal   Collection Time: 06/04/23  6:46 AM  Result Value Ref Range   aPTT 37 (H) 24 - 36 seconds    Comment:        IF BASELINE aPTT IS ELEVATED, SUGGEST PATIENT RISK ASSESSMENT BE USED TO DETERMINE APPROPRIATE ANTICOAGULANT THERAPY. Performed at [facility], [address], [place], [place] [address]   Brain natriuretic peptide     Status: Abnormal   Collection Time: 06/04/23  6:46 AM  Result Value Ref Range   B Natriuretic Peptide 2,911.5 (H) 0.0 - 100.0 pg/mL    Comment: Performed at [facility], [address] [place], [place] [address]  I-Stat Lactic Acid, ED     Status: Abnormal   Collection Time: 06/04/23  6:50 AM  Result Value Ref Range   Lactic Acid, Venous 2.1 (HH) 0.5 - 1.9 mmol/L   Comment NOTIFIED PHYSICIAN   Urinalysis, w/ Reflex to Culture (Infection Suspected) -Urine, Catheterized; Indwelling urinary catheter     Status: Abnormal   Collection Time: 06/04/23  6:52 AM  Result Value Ref Range   Specimen Source URINE, CATHETERIZED    Color, Urine RED (A) YELLOW    Comment: BIOCHEMICALS MAY BE AFFECTED BY COLOR   APPearance TURBID (A) CLEAR   Specific Gravity, Urine  1.005 - 1.030    TEST NOT REPORTED DUE TO COLOR INTERFERENCE OF URINE PIGMENT  pH  5.0 - 8.0    TEST NOT REPORTED DUE TO COLOR INTERFERENCE OF URINE PIGMENT   Glucose, UA (A) NEGATIVE mg/dL    TEST NOT REPORTED DUE TO COLOR INTERFERENCE OF URINE PIGMENT   Hgb urine dipstick (A) NEGATIVE    TEST NOT REPORTED DUE TO COLOR INTERFERENCE OF URINE PIGMENT   Bilirubin Urine (A) NEGATIVE    TEST NOT  REPORTED DUE TO COLOR INTERFERENCE OF URINE PIGMENT   Ketones, ur (A) NEGATIVE mg/dL    TEST NOT REPORTED DUE TO COLOR INTERFERENCE OF URINE PIGMENT   Protein, ur (A) NEGATIVE mg/dL    TEST NOT REPORTED DUE TO COLOR INTERFERENCE OF URINE PIGMENT   Nitrite (A) NEGATIVE    TEST NOT REPORTED DUE TO COLOR INTERFERENCE OF URINE PIGMENT   Leukocytes,Ua (A) NEGATIVE    TEST NOT REPORTED DUE TO COLOR INTERFERENCE OF URINE PIGMENT   Squamous Epithelial / HPF 0-5 0 - 5 /HPF   WBC, UA >50 0 - 5 WBC/hpf    Comment: Reflex urine culture not performed if WBC <=10, OR if Squamous epithelial cells >5. If Squamous epithelial cells >5, suggest recollection.   RBC / HPF >50 0 - 5 RBC/hpf   Bacteria, UA MANY (A) NONE SEEN    Comment: Performed at [facility], [address], [place], [place] [address]  I-Stat Lactic Acid, ED     Status: None   Collection Time: 06/04/23  9:50 AM  Result Value Ref Range   Lactic Acid, Venous 1.8 0.5 - 1.9 mmol/L   CT Cervical Spine Wo Contrast  Result Date: 06/04/2023 CLINICAL DATA:  Neck trauma. EXAM: CT CERVICAL SPINE WITHOUT CONTRAST TECHNIQUE: Multidetector CT imaging of the cervical spine was performed without intravenous contrast. Multiplanar CT image reconstructions were also generated. RADIATION DOSE REDUCTION: This exam was performed according to the departmental dose-optimization program which includes automated exposure control, adjustment of the mA and/or kV according to patient size and/or use of iterative reconstruction technique. COMPARISON:  None available FINDINGS: Alignment: No significant listhesis is present. Cervical lordosis is normal Skull base and vertebrae: Craniocervical junction is within normal limits. Prominent pannus formation is noted about the dens. Ample space is present at the craniocervical junction. The vertebral body heights are normal. No acute fractures are present. Soft tissues and spinal canal: No prevertebral fluid or swelling. No  visible canal hematoma. Disc levels: Multilevel uncovertebral and facet hypertrophy is present. Upper chest: The lung apices are clear. The thoracic inlet is within normal limits. IMPRESSION: 1. No acute fracture or traumatic subluxation. 2. Multilevel degenerative changes of the cervical spine as described. Electronically Signed   By: Marin Roberts M.D.   On: 06/04/2023 08:59   CT ABDOMEN PELVIS W CONTRAST  Result Date: 06/04/2023 CLINICAL DATA:  History of Fournier's gangrene status post incision and drainage presenting after fall to left side EXAM: CT ABDOMEN AND PELVIS WITH CONTRAST TECHNIQUE: Multidetector CT imaging of the abdomen and pelvis was performed using the standard protocol following bolus administration of intravenous contrast. RADIATION DOSE REDUCTION: This exam was performed according to the departmental dose-optimization program which includes automated exposure control, adjustment of the mA and/or kV according to patient size and/or use of iterative reconstruction technique. CONTRAST:  75mL OMNIPAQUE IOHEXOL 350 MG/ML SOLN COMPARISON:  CT abdomen and pelvis dated 04/01/2023 FINDINGS: Lower chest: No focal consolidation or pulmonary nodule in the lung bases. Increased large bilateral pleural effusions. Partially imaged heart size is normal. Coronary artery calcifications.  Hepatobiliary: No focal hepatic lesions. No intra or extrahepatic biliary ductal dilation. Cholelithiasis. Pancreas: No focal lesions or main ductal dilation. Spleen: Normal in size without focal abnormality. Adrenals/Urinary Tract: No adrenal nodules. Similar left hydroureteronephrosis to the level of the decompressed urinary bladder. New increased hyperattenuation of the distal left ureter (3:75). No right hydronephrosis. No suspicious renal masses or calculi. Urinary catheter in-situ. Stomach/Bowel: Normal appearance of the stomach. No bowel wall thickening. Mildly dilated rectum contains inspissated stool. Normal  appendix. Vascular/Lymphatic: Aortic atherosclerosis. No enlarged abdominal or pelvic lymph nodes. Reproductive: Prostate is unremarkable. Other: Increased small volume ascites. No free air or fluid collection. Musculoskeletal: No acute or abnormal lytic or blastic osseous lesions. Diffuse body wall edema. Previously noted subcutaneous emphysema is no longer seen in the partially imaged pubic region. IMPRESSION: 1. Similar left hydroureteronephrosis to the level of the decompressed urinary bladder. New increased hyperattenuation of the distal left ureter, which may represent blood products or mural thickening. Recommend consultation to Urology with cystoscopic evaluation as clinically indicated. 2. Increased large bilateral pleural effusions. Increased small volume ascites. 3. Mildly dilated rectum contains inspissated stool. 4. Previously noted subcutaneous emphysema is no longer seen in the partially imaged pubic region. 5. Aortic Atherosclerosis (ICD10-I70.0). Coronary artery calcifications. Assessment for potential risk factor modification, dietary therapy or pharmacologic therapy may be warranted, if clinically indicated. Electronically Signed   By: Agustin Cree M.D.   On: 06/04/2023 08:55   CT Head Wo Contrast  Result Date: 06/04/2023 CLINICAL DATA:  Head trauma. EXAM: CT HEAD WITHOUT CONTRAST TECHNIQUE: Contiguous axial images were obtained from the base of the skull through the vertex without intravenous contrast. RADIATION DOSE REDUCTION: This exam was performed according to the departmental dose-optimization program which includes automated exposure control, adjustment of the mA and/or kV according to patient size and/or use of iterative reconstruction technique. COMPARISON:  CT head without contrast 05/06/2023. MR head without and with contrast 05/06/2023. 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  Associated volume loss is noted. No acute infarct, hemorrhage, or mass lesion is present. The ventricles are of normal size. No significant extraaxial fluid collection is present. The brainstem and cerebellum are within normal limits. Midline structures are within normal limits. Vascular: Atherosclerotic calcifications are present within the cavernous internal carotid arteries bilaterally and at the dural margin of both vertebral arteries. No hyperdense vessel is present. Skull: Left parietal scalp soft tissue swelling is present. No underlying fracture is present. Calvarium is intact. Sinuses/Orbits: The paranasal sinuses and mastoid air cells are clear. A right lens replacement is present. Globes and orbits are within normal limits. IMPRESSION: 1. Left parietal scalp soft tissue swelling without underlying fracture. 2. No acute intracranial abnormality or significant interval change. 3. 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 Electronically Signed   By: Marin Roberts M.D.   On: 06/04/2023 08:47   DG Pelvis Portable  Result Date: 06/04/2023 CLINICAL DATA:  Level 2 fall on blood thinners. EXAM: PORTABLE PELVIS 1 VIEWS COMPARISON:  Pelvis MRI 04/04/2023 FINDINGS: Limited by underpenetration and leftward rotation. No fracture, dislocation, or gross erosion. IMPRESSION: Limited study without acute fracture or hip dislocation. Electronically Signed   By: Tiburcio Pea M.D.   On: 06/04/2023 06:38   DG Chest Portable 1 View  Result Date: 06/04/2023 CLINICAL DATA:  Level 2 trauma.  Fall.  Infection. EXAM: PORTABLE CHEST  1 VIEW COMPARISON:  05/06/2023 FINDINGS: The cardio pericardial silhouette is enlarged. Patchy mid and lower lung predominant airspace disease seen bilaterally. Left pleural effusion noted. No acute bony abnormality. Degenerative changes evident left shoulder. IMPRESSION: Patchy mid and lower lung  predominant airspace disease with left pleural effusion. Imaging features could be related to edema or infection. Electronically Signed   By: Kennith Center M.D.   On: 06/04/2023 06:32    Pending Labs Unresulted Labs (From admission, onward)     Start     Ordered   06/04/23 8295  Urine Culture  Once,   R        06/04/23 0652   06/04/23 0622  Blood Culture (routine x 2)  (Undifferentiated presentation (screening labs and basic nursing orders))  BLOOD CULTURE X 2,   STAT      06/04/23 0622            Vitals/Pain Today's Vitals   06/04/23 0615 06/04/23 0945 06/04/23 1000 06/04/23 1030  BP: 113/85 115/77  113/70  Pulse: 96 92  86  Resp: 15 18  20   Temp:      TempSrc:      SpO2: 100% 98%  99%  Weight:   113.4 kg   Height:   6\' 2"  (1.88 m)   PainSc:        Isolation Precautions No active isolations  Medications Medications  piperacillin-tazobactam (ZOSYN) IVPB 3.375 g (has no administration in time range)  vancomycin (VANCOREADY) IVPB 1500 mg/300 mL (has no administration in time range)    Followed by  vancomycin (VANCOCIN) IVPB 1000 mg/200 mL premix (has no administration in time range)  iohexol (OMNIPAQUE) 350 MG/ML injection 75 mL (75 mLs Intravenous Contrast Given 06/04/23 0829)    Mobility walks with device     Focused Assessments    R Recommendations: See Admitting Provider Note  Report given to:   Additional Notes:

## 2023-06-04 NOTE — H&P (Addendum)
History and Physical    Peter Yang UYQ:[id number] DOB: 1956/02/11 DOA: 06/04/2023  PCP: Patient, No Pcp Per  Patient coming from: NH  I have personally briefly reviewed patient's old medical records in [facility]  Chief Complaint: fall  HPI: Peter Yang is a 67 y.o. 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 Patient course further complicated by  recent diagnosis of Fournier's gangrene s/p debridement x 2 during hospitalization 6/12-6/19/2024 followed by course of iv daptomycin and ertapenem with end dated of 05/20/23.[name] Patient now returns from NH s/p fall with head strike after attempting to get remote that had fallen on floor. On evaluation in ED patient appeared well but was noted to have surgical wound dehiscence and poor healing of perineal with concern for infection. Patient  notes no fever no chills, denies sob/chest /pain /vomiting / fever [name] Course. Notes he lives alone. He is unable to describe the kind of wound care he employs when taking care of his scrotal surgical incision. He is not very forthcoming regarding care of his wound. He is however willing to be placed in a rehab for wound care.  ED Course:  Vitals: afeb, bp 112/72, vitals: 113/85, HR 96, rr 15, sat 100%  Cxr: Patchy mid and lower lung predominant airspace disease with left pleural effusion. Imaging features could be related to edema or infection.  Pelvis: xray IMPRESSION: Limited study without acute fracture or hip dislocation.   Labs: Wbc 9.3, hgb 11.3 (7.8), plt197,  PT/INR: 1.7, PTT37 Na 134  (138), co2 21, cr 0.92 bNP 2911 Lactic 2.1 repeat 1.8 UA: turbid ,  wbc >50 rbc>50 many  bacteria  CTH: NAD  CT abd/pelvis: IMPRESSION: 1. Similar left hydroureteronephrosis to the level of the decompressed urinary bladder. New increased hyperattenuation of the distal left ureter, which may represent blood products or mural thickening. Recommend consultation to Urology with cystoscopic evaluation as clinically indicated. 2. Increased large bilateral pleural effusions. Increased small volume ascites. 3. Mildly dilated rectum contains inspissated stool. 4. Previously noted subcutaneous emphysema is no longer seen in the partially imaged pubic region. 5. Aortic Atherosclerosis (ICD10-I70.0). Coronary artery calcifications. Assessment for potential risk factor modification, dietary therapy or pharmacologic therapy may be warranted, if clinically indicated.  EKG:  Atrial fib  RBBB/LPFB   Review of Systems: As per HPI otherwise 10 point review of systems negative.   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 Overview:  Added automatically from request for surgery [id number] Formatting of this note might be different from  the original. Added automatically from request for surgery [id number]   Large bowel perforation (HCC) 09/27/2015   Malignant neoplasm  of prostate (HCC) 10/22/2019   Myopia with astigmatism and presbyopia, bilateral 06/15/2018   Last Assessment & Plan:  Formatting of this note might be different from the original. - Wrx printed Formatting of this note might be different from the original. 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 - Stable, monitor Formatting of this note might be different from the original. Last Assessment & Plan:  - Stable, monitor   Retroperitoneal abscess (HCC) 10/02/2015   Status post corneal transplant 10/07/2016   Formatting of this note might be different from the original. Overview:  Added automatically from request for surgery [id number] Formatting of this note might be different from the original. Added automatically from request for surgery [id number]  Last Assessment & Plan:  Formatting of this note might be different from the original. - OD, 2/2 KCN - Stable, no NVK / rejection - Continue off steroids - Mo   Thoracic ascending aortic aneurysm (HCC)    TIA (transient ischemic attack) 2012   Type 2 diabetes mellitus [facility])     Past Surgical History:  Procedure Laterality Date   BELOW KNEE LEG AMPUTATION Left 2022   CORONARY ANGIOPLASTY WITH STENT PLACEMENT  01/2022   DES RCA   IRRIGATION AND DEBRIDEMENT ABSCESS N/A 04/02/2023   Procedure: IRRIGATION AND DEBRIDEMENT ABSCESS;  Surgeon: Crist Fat, MD;  Location: [facility] OR;  Service: Urology;  Laterality: N/A;   IRRIGATION AND DEBRIDEMENT ABSCESS N/A 04/09/2023   Procedure: SCROTAL WASHOUT AND DEBRIDEMENT WITH WOUND CLOSURE;  Surgeon: Crist Fat, MD;  Location: [facility] OR;  Service: Urology;  Laterality: N/A;  60 MINUTES   LEFT  HEART CATH AND CORONARY ANGIOGRAPHY N/A 06/17/2022   Procedure: LEFT HEART CATH AND CORONARY ANGIOGRAPHY;  Surgeon: Yvonne Kendall, MD;  Location: [facility] INVASIVE CV LAB;  Service: Cardiovascular;  Laterality: N/A;   ROTATOR CUFF REPAIR Left      reports that he has never smoked. He has never used smokeless tobacco. He reports that he does not drink alcohol and does not use drugs.  Allergies  Allergen Reactions   Sglt2 Inhibitors     Fournier's gangrene    Family History  Problem Relation Age of Onset   Diabetes Mother    Hypertension Mother    Heart disease Mother    Cancer Father    Heart attack Sister    Cancer Sister    Diabetes Brother     Prior to Admission medications   Medication Sig Start Date End Date Taking? Authorizing Provider  acetaminophen (TYLENOL) 325 MG tablet Take 650 mg by mouth every 4 (four) hours as needed for mild pain or moderate pain.    [provider]  acetaminophen (TYLENOL) 500 MG tablet Take 2 tablets (1,000 mg total) by mouth every 8 (eight) hours. Patient taking differently: Take 1,000 mg by mouth every 8 (eight) hours as needed for mild pain or moderate pain. 04/18/23   Joseph Art, DO  albuterol (PROVENTIL) (2.5 MG/3ML) 0.083% nebulizer solution Take 2.5 mg by nebulization every  4 (four) hours as needed for wheezing or shortness of breath.    [provider]  albuterol (VENTOLIN HFA) 108 (90 Base) MCG/ACT inhaler Inhale 2 puffs into the lungs every 4 (four) hours as needed for wheezing or shortness of breath.    [provider]  Amino Acids-Protein Hydrolys (FEEDING SUPPLEMENT, PRO-STAT SUGAR FREE 64,) LIQD Take 30 mLs by mouth daily.    [provider]  apixaban (ELIQUIS) 5 MG TABS tablet Take 1 tablet (5 mg total) by mouth 2 (two) times daily. 08/16/22 05/06/23  Levin Erp, MD  atorvastatin (LIPITOR) 80 MG tablet Take 0.5 tablets (40 mg total) by mouth every evening. Patient taking differently: Take 80 mg  by mouth daily. 04/18/23   Joseph Art, DO  B Complex-C (B-COMPLEX WITH VITAMIN C) tablet Take 1 tablet by mouth daily. 10/24/22   Lanae Boast, MD  bisacodyl (DULCOLAX) 10 MG suppository Place 1 suppository (10 mg total) rectally daily as needed for moderate constipation. Patient taking differently: Place 10 mg rectally See admin instructions. Every 2-4 hours as needed for constipation. 10/24/22   Lanae Boast, MD  diclofenac Sodium (VOLTAREN) 1 % GEL Apply 1 g topically daily. Apply to bilateral knees 07/20/21   [provider]  docusate sodium (COLACE) 100 MG capsule Take 1 capsule (100 mg total) by mouth 2 (two) times daily. 04/18/23   Joseph Art, DO  feeding supplement (ENSURE ENLIVE / ENSURE PLUS) LIQD Take 237 mLs by mouth 3 (three) times daily between meals. Patient not taking: Reported on 04/02/2023 10/24/22   Lanae Boast, MD  ferrous sulfate 325 (65 FE) MG tablet Take 1 tablet (325 mg total) by mouth daily with breakfast. 10/25/22   Lanae Boast, MD  finasteride (PROSCAR) 5 MG tablet Take 5 mg by mouth daily.    [provider]  fluconazole (DIFLUCAN) 100 MG tablet Take 100 mg by mouth daily.    [provider]  folic acid (FOLVITE) 1 MG tablet Take 1 tablet (1 mg total) by mouth daily. 05/14/23   Leroy Sea, MD  gabapentin (NEURONTIN) 300 MG capsule Take 1 capsule (300 mg total) by mouth at bedtime. 04/18/23   Joseph Art, DO  HUMALOG KWIKPEN 100 UNIT/ML KwikPen Inject 0-15 Units into the skin with breakfast, with lunch, and with evening meal. Sliding scale: If 0-100 = 0 units <60 call MD; 101-150= 3 units; 151-200= 4 units; 201-250= 7 units; 251-300= 9 units; 301-350= 12 units; 351-400= 15 units > 400 call MD. 04/22/23   [provider]  hydrocortisone (CORTEF) 5 MG tablet Take 1 tablet (5 mg total) by mouth 2 (two) times daily. 04/18/23   Joseph Art, DO  insulin glargine-yfgn (SEMGLEE) 100 UNIT/ML injection Inject 0.2 mLs (20 Units total) into the skin  at bedtime. 04/18/23   Marlin Canary U, DO  melatonin 5 MG TABS Take 1 tablet (5 mg total) by mouth daily at 6 PM. Patient taking differently: Take 5 mg by mouth at bedtime. 10/24/22   Lanae Boast, MD  midodrine (PROAMATINE) 10 MG tablet Take 1 tablet (10 mg total) by mouth with breakfast, with lunch, and with evening meal. 04/18/23   Marlin Canary U, DO  nystatin (MYCOSTATIN/NYSTOP) powder Apply topically 3 (three) times daily. 05/13/23   Leroy Sea, MD  oxyCODONE (OXY IR/ROXICODONE) 5 MG immediate release tablet Take 1 tablet (5 mg total) by mouth every 4 (four) hours as needed for moderate pain, severe pain or breakthrough pain. 05/13/23  Leroy Sea, MD  Oxycodone HCl 10 MG TABS Take 1.5 tablets (15 mg total) by mouth every 12 (twelve) hours. 05/13/23   Leroy Sea, MD  OXYGEN Inhale 2 L into the lungs continuous. To maintain stats above 90%  PRN order: use as needed for shortness of breath while laying flat on exertion    [provider]  pantoprazole (PROTONIX) 40 MG tablet Take 1 tablet (40 mg total) by mouth daily. 06/30/22   Pokhrel, Rebekah Chesterfield, MD  saccharomyces boulardii (FLORASTOR) 250 MG capsule Take 250 mg by mouth 2 (two) times daily.    [provider]  senna-docusate (SENOKOT-S) 8.6-50 MG tablet Take 1 tablet by mouth 2 (two) times daily. 10/24/22   Lanae Boast, MD  sodium hypochlorite (DAKIN'S 1/4 STRENGTH) 0.125 % SOLN Apply 1 Application topically daily.    [provider]  tamsulosin (FLOMAX) 0.4 MG CAPS capsule Take 0.4 mg by mouth daily. 07/29/22   [provider]    Physical Exam: Vitals:   06/04/23 0945 06/04/23 1000 06/04/23 1030 06/04/23 1100  BP: 115/77  113/70   Pulse: 92  86   Resp: 18  20   Temp:    98 F (36.7 C)  TempSrc:    Oral  SpO2: 98%  99%   Weight:  113.4 kg    Height:  6\' 2"  (1.88 m)      Constitutional: NAD, calm, comfortable Vitals:   06/04/23 0945 06/04/23 1000 06/04/23 1030 06/04/23 1100  BP: 115/77   113/70   Pulse: 92  86   Resp: 18  20   Temp:    98 F (36.7 C)  TempSrc:    Oral  SpO2: 98%  99%   Weight:  113.4 kg    Height:  6\' 2"  (1.88 m)     Eyes: PERRL, lids and conjunctivae normal ENMT: Mucous membranes are moist. Posterior pharynx clear of any exudate or lesions.Normal dentition.  Neck: normal, supple, no masses, no thyromegaly Respiratory: clear to auscultation bilaterally, no wheezing, no crackles. Normal respiratory effort. No accessory muscle use.  Cardiovascular: Regular rate and rhythm, no murmurs / rubs / gallops. +extremity edema. 2+ pedal pulses. Abdomen: no tenderness, no masses palpated. No hepatosplenomegaly. Bowel sounds positive. anasarca Musculoskeletal: no clubbing / cyanosis.left bka, no contractures. Normal muscle tone.  GU: noted significant scrotal swelling  purulent drainage from surgical suture line Skin: see above Neurologic: CN 2-12 grossly intact. Sensation intact, Strength 5/5 in all 4.  Psychiatric: Normal judgment and insight. Alert and oriented x 3. Normal mood.    Labs on Admission: I have personally reviewed following labs and imaging studies  CBC: Recent Labs  Lab 06/04/23 0644  WBC 9.3  NEUTROABS 7.2  HGB 11.3*  HCT 38.1*  MCV 88.6  PLT 197   Basic Metabolic Panel: Recent Labs  Lab 06/04/23 0646  NA 134*  K 3.8  CL 102  CO2 21*  GLUCOSE 166*  BUN 14  CREATININE 0.92  CALCIUM 8.5*   GFR: Estimated Creatinine Clearance: 105.8 mL/min (by C-G formula based on SCr of 0.92 mg/dL). Liver Function Tests: Recent Labs  Lab 06/04/23 0646  AST 19  ALT 12  ALKPHOS 85  BILITOT 1.1  PROT 6.3*  ALBUMIN 3.0*   No results for input(s): "LIPASE", "AMYLASE" in the last 168 hours. No results for input(s): "AMMONIA" in the last 168 hours. Coagulation Profile: Recent Labs  Lab 06/04/23 0646  INR 1.7*   Cardiac Enzymes: No results for  input(s): "CKTOTAL", "CKMB", "CKMBINDEX", "TROPONINI" in the last 168 hours. BNP (last 3  results) No results for input(s): "PROBNP" in the last 8760 hours. HbA1C: No results for input(s): "HGBA1C" in the last 72 hours. CBG: No results for input(s): "GLUCAP" in the last 168 hours. Lipid Profile: No results for input(s): "CHOL", "HDL", "LDLCALC", "TRIG", "CHOLHDL", "LDLDIRECT" in the last 72 hours. Thyroid Function Tests: No results for input(s): "TSH", "T4TOTAL", "FREET4", "T3FREE", "THYROIDAB" in the last 72 hours. Anemia Panel: No results for input(s): "VITAMINB12", "FOLATE", "FERRITIN", "TIBC", "IRON", "RETICCTPCT" in the last 72 hours. Urine analysis:    Component Value Date/Time   COLORURINE RED (A) 06/04/2023 0652   APPEARANCEUR TURBID (A) 06/04/2023 0652   LABSPEC  06/04/2023 0652    TEST NOT REPORTED DUE TO COLOR INTERFERENCE OF URINE PIGMENT   PHURINE  06/04/2023 0652    TEST NOT REPORTED DUE TO COLOR INTERFERENCE OF URINE PIGMENT   GLUCOSEU (A) 06/04/2023 0652    TEST NOT REPORTED DUE TO COLOR INTERFERENCE OF URINE PIGMENT   HGBUR (A) 06/04/2023 0652    TEST NOT REPORTED DUE TO COLOR INTERFERENCE OF URINE PIGMENT   BILIRUBINUR (A) 06/04/2023 0652    TEST NOT REPORTED DUE TO COLOR INTERFERENCE OF URINE PIGMENT   KETONESUR (A) 06/04/2023 0652    TEST NOT REPORTED DUE TO COLOR INTERFERENCE OF URINE PIGMENT   PROTEINUR (A) 06/04/2023 0652    TEST NOT REPORTED DUE TO COLOR INTERFERENCE OF URINE PIGMENT   NITRITE (A) 06/04/2023 0652    TEST NOT REPORTED DUE TO COLOR INTERFERENCE OF URINE PIGMENT   LEUKOCYTESUR (A) 06/04/2023 0652    TEST NOT REPORTED DUE TO COLOR INTERFERENCE OF URINE PIGMENT    Radiological Exams on Admission: CT Cervical Spine Wo Contrast  Result Date: 06/04/2023 CLINICAL DATA:  Neck trauma. EXAM: CT CERVICAL SPINE WITHOUT CONTRAST TECHNIQUE: Multidetector CT imaging of the cervical spine was performed without intravenous contrast. Multiplanar CT image reconstructions were also generated. RADIATION DOSE REDUCTION: This exam was performed  according to the departmental dose-optimization program which includes automated exposure control, adjustment of the mA and/or kV according to patient size and/or use of iterative reconstruction technique. COMPARISON:  None available FINDINGS: Alignment: No significant listhesis is present. Cervical lordosis is normal Skull base and vertebrae: Craniocervical junction is within normal limits. Prominent pannus formation is noted about the dens. Ample space is present at the craniocervical junction. The vertebral body heights are normal. No acute fractures are present. Soft tissues and spinal canal: No prevertebral fluid or swelling. No visible canal hematoma. Disc levels: Multilevel uncovertebral and facet hypertrophy is present. Upper chest: The lung apices are clear. The thoracic inlet is within normal limits. IMPRESSION: 1. No acute fracture or traumatic subluxation. 2. Multilevel degenerative changes of the cervical spine as described. Electronically Signed   By: Marin Roberts M.D.   On: 06/04/2023 08:59   CT ABDOMEN PELVIS W CONTRAST  Result Date: 06/04/2023 CLINICAL DATA:  History of Fournier's gangrene status post incision and drainage presenting after fall to left side EXAM: CT ABDOMEN AND PELVIS WITH CONTRAST TECHNIQUE: Multidetector CT imaging of the abdomen and pelvis was performed using the standard protocol following bolus administration of intravenous contrast. RADIATION DOSE REDUCTION: This exam was performed according to the departmental dose-optimization program which includes automated exposure control, adjustment of the mA and/or kV according to patient size and/or use of iterative reconstruction technique. CONTRAST:  75mL OMNIPAQUE IOHEXOL 350 MG/ML SOLN COMPARISON:  CT abdomen and pelvis dated  04/01/2023 FINDINGS: Lower chest: No focal consolidation or pulmonary nodule in the lung bases. Increased large bilateral pleural effusions. Partially imaged heart size is normal. Coronary artery  calcifications. Hepatobiliary: No focal hepatic lesions. No intra or extrahepatic biliary ductal dilation. Cholelithiasis. Pancreas: No focal lesions or main ductal dilation. Spleen: Normal in size without focal abnormality. Adrenals/Urinary Tract: No adrenal nodules. Similar left hydroureteronephrosis to the level of the decompressed urinary bladder. New increased hyperattenuation of the distal left ureter (3:75). No right hydronephrosis. No suspicious renal masses or calculi. Urinary catheter in-situ. Stomach/Bowel: Normal appearance of the stomach. No bowel wall thickening. Mildly dilated rectum contains inspissated stool. Normal appendix. Vascular/Lymphatic: Aortic atherosclerosis. No enlarged abdominal or pelvic lymph nodes. Reproductive: Prostate is unremarkable. Other: Increased small volume ascites. No free air or fluid collection. Musculoskeletal: No acute or abnormal lytic or blastic osseous lesions. Diffuse body wall edema. Previously noted subcutaneous emphysema is no longer seen in the partially imaged pubic region. IMPRESSION: 1. Similar left hydroureteronephrosis to the level of the decompressed urinary bladder. New increased hyperattenuation of the distal left ureter, which may represent blood products or mural thickening. Recommend consultation to Urology with cystoscopic evaluation as clinically indicated. 2. Increased large bilateral pleural effusions. Increased small volume ascites. 3. Mildly dilated rectum contains inspissated stool. 4. Previously noted subcutaneous emphysema is no longer seen in the partially imaged pubic region. 5. Aortic Atherosclerosis (ICD10-I70.0). Coronary artery calcifications. Assessment for potential risk factor modification, dietary therapy or pharmacologic therapy may be warranted, if clinically indicated. Electronically Signed   By: Agustin Cree M.D.   On: 06/04/2023 08:55   CT Head Wo Contrast  Result Date: 06/04/2023 CLINICAL DATA:  Head trauma. EXAM: CT HEAD  WITHOUT CONTRAST TECHNIQUE: Contiguous axial images were obtained from the base of the skull through the vertex without intravenous contrast. RADIATION DOSE REDUCTION: This exam was performed according to the departmental dose-optimization program which includes automated exposure control, adjustment of the mA and/or kV according to patient size and/or use of iterative reconstruction technique. COMPARISON:  CT head without contrast 05/06/2023. MR head without and with contrast 05/06/2023. 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 Associated volume loss is noted. No acute infarct, hemorrhage, or mass lesion is present. The ventricles are of normal size. No significant extraaxial fluid collection is present. The brainstem and cerebellum are within normal limits. Midline structures are within normal limits. Vascular: Atherosclerotic calcifications are present within the cavernous internal carotid arteries bilaterally and at the dural margin of both vertebral arteries. No hyperdense vessel is present. Skull: Left parietal scalp soft tissue swelling is present. No underlying fracture is present. Calvarium is intact. Sinuses/Orbits: The paranasal sinuses and mastoid air cells are clear. A right lens replacement is present. Globes and orbits are within normal limits. IMPRESSION: 1. Left parietal scalp soft tissue swelling without underlying fracture. 2. No acute intracranial abnormality or significant interval change. 3. 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 Electronically Signed   By: Marin Roberts M.D.   On: 06/04/2023 08:47   DG Pelvis Portable  Result Date: 06/04/2023 CLINICAL DATA:  Level 2 fall on blood thinners. EXAM: PORTABLE PELVIS 1 VIEWS COMPARISON:  Pelvis MRI 04/04/2023 FINDINGS: Limited by  underpenetration and leftward rotation. No fracture, dislocation, or gross erosion. IMPRESSION: Limited study without acute fracture or hip dislocation. Electronically Signed   By: Audry Riles.D.  On: 06/04/2023 06:38   DG Chest Portable 1 View  Result Date: 06/04/2023 CLINICAL DATA:  Level 2 trauma.  Fall.  Infection. EXAM: PORTABLE CHEST 1 VIEW COMPARISON:  05/06/2023 FINDINGS: The cardio pericardial silhouette is enlarged. Patchy mid and lower lung predominant airspace disease seen bilaterally. Left pleural effusion noted. No acute bony abnormality. Degenerative changes evident left shoulder. IMPRESSION: Patchy mid and lower lung predominant airspace disease with left pleural effusion. Imaging features could be related to edema or infection. Electronically Signed   By: Kennith Center M.D.   On: 06/04/2023 06:32    EKG: Independently reviewed. See above   Assessment/Plan   Acute on Chronic combined diastolic /systolic heart failure without  acute hypoxic respiratory failure  B/L pleural effusion -lasix 60 mg iv bid  -not on ace of bb as intolerant due to low blood pressure -patient is maintained on midodrine -ekg without hyperacute st -twave changes  -can consider IR for evaluation for possible thoracentesis in am if patient amendable  UTI with hematuria  -on broad spectrum abx -f/u on urine culture  -await urology rec re - hematuria  Fournier's Gangrene s/p treatment now with superimposed  suture line infection presumed bacterial as well superimposed tenia cruris  -continue on broad spectrum abx await culture data and further urology recs -wound care to see  - Nyastatin powder bid    Anemia of chronic disease  -currently h/h improved from prior now 11.3 was 7.8  -continue to monitor counts   P afib  -holding eliquis due to hematuria  -not on rate control agent   Chronic adrenal insufficiency  -continue home steroid regimen   Protein Calorie Malnutrition -nutrition  to see    S/p Left BKA   DMII -iss/fs -resume lantus   DVT prophylaxis: scd  Code Status: DNR/ as discussed per patient wishes in event of cardiac arrest  Family Communication: none Disposition Plan: patient  expected to be admitted greater than 2 midnights  Consults called: Dr Drue Second [facility]rology  Admission status: med tele   Lurline Del MD [facility]  If 7PM-7AM, please contact night-coverage [email or web address] Password [facility]  06/04/2023, 11:19 AM

## 2023-06-04 NOTE — Progress Notes (Signed)
ED Pharmacy Antibiotic Sign Off An antibiotic consult was received from an ED provider for Zosyn (piperacillin-tazobactam) and Vancomycin per pharmacy dosing for purulent cellulitis from scrotal incision site. A chart review was completed to assess appropriateness.   The following one time order(s) were placed:  Zosyn 3.375g IV over 30 minutes x1 Vancomycin 1500mg  IV x1 followed by 1000mg  IV x1 for a total loading dose of vancomycin 2500mg   Further antibiotic and/or antibiotic pharmacy consults should be ordered by the admitting provider if indicated.   Thank you for allowing pharmacy to be a part of this patient's care.   Doristine Counter, [facility]  Clinical Pharmacist 06/04/23 10:34 AM

## 2023-06-04 NOTE — Consult Note (Addendum)
WOC Nurse Consult Note: Reason for Consult: wound check for right foot full thickness wound, last seen by podiatric medicine provider Dr. Cheryll Dessert on 03/10/23. 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 Stage 2 PI to sacrum. Wound type: surgical Pressure Injury POA:Yes Measurement: Right foot: 4cm x 5cm x 0.4cm with 95% red wound base, 5% black soft eschar. Moderate amount serous exudate. Sacral stage 2:  Bedside RN to measure and document measurements on Nursing flow sheet with application of next dressing today Wound bed:As described above Drainage (amount, consistency, odor) As described above Periwound: Intact  Dressing procedure/placement/frequency: I will provide Nursing with conservative care orders for this wound using a daily cleanse with Vashe pure hypochlorous acid [place] # [phone]) and a Vashe moistened gauze dressing left in place and topped with dry gauze secured with Kerlix roll gauze/paper tape. The right foot is to be placed into a Prevalon pressure distribution heel boot. A sacral foam is to be placed after cleansing and topical care to the stage 2 PI using Medihoney (leptospermum Manuka honey) once daily. Right pretibial wound is nearly reepithelialized and left knee traumatic wound from fall is granulating and conservative care using xeroform and changed every other day is appropriate.  Urology has just been in to see for the scrotal area. See note from that Encounter.  Recommend that patient follow up with Podiatry as ordered for follow up on the right foot wound. If you agree, please order/recommend follow up at discharge.  WOC nursing team will not follow, but will remain available to this patient, the nursing and medical teams.    Thank you for inviting [place] to participate in this patient's Plan of Care.  Ladona Mow, MSN, RN, CNS, GNP, Leda Min, [employer], [employer] phone:  [phone]

## 2023-06-04 NOTE — ED Triage Notes (Signed)
Pt BIB [facility] from [place] rehab. Pts remote fell off the bed and pt was reaching to get it. Pt hit head on the night stand. Pt takes eliquis. Denies CP/SOB

## 2023-06-04 NOTE — Progress Notes (Signed)
Orthopedic Tech Progress Note Patient Details:  Peter Yang 1956/03/27 [id number]  Patient ID: Peter Yang, male   DOB: 01-29-1956, 67 y.o.   MRN: [id number] Level 2 trauma, not needed.  L  06/04/2023, 6:22 AM

## 2023-06-04 NOTE — Consult Note (Signed)
Urology Consult Note   Requesting Attending Physician:  Lurline Del, MD Service Providing Consult: Urology  Consulting Attending: Dr. Sherron Monday   Reason for Consult:  purulent drainage from scrotal sutures  HPI: Peter Yang is seen in consultation for reasons noted above at the request of Lurline Del, MD  ------------------  Assessment:  67 y.o. male post Fournier's gangrene debridement with retained sutures, now purulent drainage from lower right hemiscrotum.   Recommendations:  # Purulent drainage from right side incision Multiple requests have been made to have our office put in contact with his caretaker at his long-term care facility to ensure that he is seen in clinic for reassessment and suture removal.  This will be at least his second readmission since his initial surgery without follow-up.  Sutures have been left in place for significantly longer than normal due to severe protein calorie malnutrition/anasarca and delayed wound healing.  I removed all 10 of them today.  There is a pinhole sinus draining at the bottom of the right side suture.  It is not large enough to probe or pack.  There is no gas or fluid accumulation present on CT imaging.    Recommend continue broad-spectrum ABX while waiting for culture data from urine and wound drainage  #Hematuria Patient receives twice daily Eliquis.  I have seen him experienced transient hematuria with his chronic indwelling Foley on multiple occasions.  Hematuria was not present on rounds.  #Foley exchange I exchanged this during his last admission on 7/22.  It will be due for exchange again in about a week.  Foley is very dirty and surrounding tissue erythematous.  Patient has buried penis and significant pelvic anasarca.  This keeps the tissue from being malleable at all and catheter cannot be exchanged.  Last admission hospitalist helped temporize this for several days with albumin and Lasix, which  effectively reduced the edema enough to get his catheter exchanged.  Obviously this will not be a realistic option going forward every 30 days.  Patient would ultimately benefit from suprapubic tube, though with his consistent failure to follow-up, I would be nervous requesting a catheter that must be sutured in place while it epithelializes.  Unfortunately there are no good answers here.  I will attempt to get his catheter exchanged while he is in hospital this time, if I can reach it.  #hydronephrosis Chronic in nature.  This has been noted on multiple CT images after adequate time provided for urinary decompression. 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 Overview:  Added automatically from request for surgery [id number] Formatting of this note might be different from the original. Added automatically from request for surgery [id number]  Large bowel perforation (HCC) 09/27/2015   Malignant neoplasm of prostate (HCC) 10/22/2019   Myopia with astigmatism and presbyopia, bilateral 06/15/2018   Last Assessment & Plan:  Formatting of this note might be different  from the original. - Wrx printed Formatting of this note might be different from the original. 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 - Stable, monitor Formatting of this note might be different from the original. Last Assessment & Plan:  - Stable, monitor   Retroperitoneal abscess (HCC) 10/02/2015   Status post corneal transplant 10/07/2016   Formatting of this note might be different from the original. Overview:  Added automatically from request for surgery [id number] Formatting of this note might be different from the original. Added automatically from request for surgery [id number]  Last Assessment & Plan:  Formatting of this note might be different from the original. - OD, 2/2 KCN - Stable, no NVK / rejection - Continue off steroids - Mo   Thoracic ascending aortic aneurysm (HCC)    TIA (transient ischemic attack) 2012   Type 2 diabetes mellitus [facility])     Past Surgical History:  Past Surgical History:  Procedure Laterality Date   BELOW KNEE LEG AMPUTATION Left 2022   CORONARY ANGIOPLASTY WITH STENT PLACEMENT  01/2022   DES RCA   IRRIGATION AND DEBRIDEMENT ABSCESS N/A 04/02/2023   Procedure: IRRIGATION AND DEBRIDEMENT ABSCESS;  Surgeon: Crist Fat, MD;  Location: [facility];  Service: Urology;  Laterality: N/A;   IRRIGATION AND DEBRIDEMENT ABSCESS N/A 04/09/2023   Procedure: SCROTAL WASHOUT AND DEBRIDEMENT WITH WOUND CLOSURE;  Surgeon: Crist Fat, MD;  Location: [facility] OR;  Service: Urology;  Laterality: N/A;  60 MINUTES   LEFT HEART CATH AND CORONARY ANGIOGRAPHY N/A 06/17/2022   Procedure: LEFT HEART CATH AND CORONARY ANGIOGRAPHY;  Surgeon: Yvonne Kendall,  MD;  Location: [facility] INVASIVE CV LAB;  Service: Cardiovascular;  Laterality: N/A;   ROTATOR CUFF REPAIR Left     Medication: Current Facility-Administered Medications  Medication Dose Route Frequency Provider Last Rate Last Admin   acetaminophen (TYLENOL) tablet 650 mg  650 mg Oral Q6H PRN Lurline Del, MD       Or   acetaminophen (TYLENOL) suppository 650 mg  650 mg Rectal Q6H PRN Lurline Del, MD       albuterol (PROVENTIL) (2.5 MG/3ML) 0.083% nebulizer solution 2.5 mg  2.5 mg Nebulization Q2H PRN Lurline Del, MD       furosemide (LASIX) injection 40 mg  40 mg Intravenous BID Skip Mayer A, MD       insulin aspart (novoLOG) injection 0-6 Units  0-6 Units Subcutaneous TID WC Lurline Del, MD       nystatin (MYCOSTATIN/NYSTOP) topical powder   Topical BID Lurline Del, MD       piperacillin-tazobactam (ZOSYN) IVPB 3.375 g  3.375 g Intravenous Once Jardin, Carla G, RPH       vancomycin (VANCOREADY) IVPB 1500 mg/300 mL  1,500 mg Intravenous Once Jardin, Carla G, RPH       Followed by   vancomycin (VANCOCIN) IVPB 1000 mg/200 mL premix  1,000 mg Intravenous  Once Doristine Counter, [place]        Allergies: Allergies  Allergen Reactions   Sglt2 Inhibitors     Fournier's gangrene    Social History: Social History   Tobacco Use   Smoking status: Never   Smokeless tobacco: Never  Substance Use Topics   Alcohol use: Never   Drug use: Never    Family History Family History  Problem Relation Age of Onset   Diabetes Mother    Hypertension Mother    Heart disease Mother    Cancer Father    Heart attack Sister    Cancer Sister    Diabetes Brother     Review of Systems  Genitourinary:  Positive for hematuria.     Objective   Vital signs in last 24 hours: BP 116/72 (BP Location: Right Arm)   Pulse 85   Temp 97.9 F (36.6 C) (Oral)   Resp 16   Ht 6\' 2"  (1.88 m)   Wt 113.4 kg   SpO2 100%   BMI 32.10 kg/m   Physical Exam General: NAD,  A&O, resting, appropriate HEENT: [place]/AT Pulmonary: Normal work of breathing Cardiovascular: RRR, no cyanosis Abdomen: Soft, NTTP, nondistended GU: encrusted foley in place, draining yellow urine. Incision down the side of each hemiscrotum, Sutures still in place.  Neuro: Appropriate, no focal neurological deficits  Most Recent Labs: Lab Results  Component Value Date   WBC 9.3 06/04/2023   HGB 11.3 (L) 06/04/2023   HCT 38.1 (L) 06/04/2023   PLT 197 06/04/2023    Lab Results  Component Value Date   NA 134 (L) 06/04/2023   K 3.8 06/04/2023   CL 102 06/04/2023   CO2 21 (L) 06/04/2023   BUN 14 06/04/2023   CREATININE 0.92 06/04/2023   CALCIUM 8.5 (L) 06/04/2023   MG 1.8 05/13/2023   PHOS 3.2 05/07/2023    Lab Results  Component Value Date   INR 1.7 (H) 06/04/2023   APTT 37 (H) 06/04/2023     Urine Culture: @[id number] (laburin,org,[id number],[id number])@   IMAGING: CT Cervical Spine Wo Contrast  Result Date: 06/04/2023 CLINICAL DATA:  Neck trauma. EXAM: CT CERVICAL SPINE WITHOUT CONTRAST TECHNIQUE: Multidetector CT imaging of the cervical spine was performed without intravenous contrast. Multiplanar CT image reconstructions were also generated. RADIATION DOSE REDUCTION: This exam was performed according to the departmental dose-optimization program which includes automated exposure control, adjustment of the mA and/or kV according to patient size and/or use of iterative reconstruction technique. COMPARISON:  None available FINDINGS: Alignment: No significant listhesis is present. Cervical lordosis is normal Skull base and vertebrae: Craniocervical junction is within normal limits. Prominent pannus formation is noted about the dens. Ample space is present at the craniocervical junction. The vertebral body heights are normal. No acute fractures are present. Soft tissues and spinal canal: No prevertebral fluid or swelling. No visible canal hematoma. Disc levels: Multilevel uncovertebral and  facet hypertrophy is present. Upper chest: The lung apices are clear. The thoracic inlet is within normal limits. IMPRESSION: 1. No acute fracture or traumatic subluxation. 2. Multilevel degenerative changes of the cervical spine as described. Electronically Signed   By: Marin Roberts M.D.   On: 06/04/2023 08:59   CT ABDOMEN PELVIS W CONTRAST  Result Date: 06/04/2023 CLINICAL DATA:  History of Fournier's gangrene status post incision and drainage presenting after fall to left side EXAM: CT ABDOMEN AND PELVIS WITH CONTRAST TECHNIQUE: Multidetector CT imaging of the abdomen and pelvis was performed using the standard protocol following bolus  administration of intravenous contrast. RADIATION DOSE REDUCTION: This exam was performed according to the departmental dose-optimization program which includes automated exposure control, adjustment of the mA and/or kV according to patient size and/or use of iterative reconstruction technique. CONTRAST:  75mL OMNIPAQUE IOHEXOL 350 MG/ML SOLN COMPARISON:  CT abdomen and pelvis dated 04/01/2023 FINDINGS: Lower chest: No focal consolidation or pulmonary nodule in the lung bases. Increased large bilateral pleural effusions. Partially imaged heart size is normal. Coronary artery calcifications. Hepatobiliary: No focal hepatic lesions. No intra or extrahepatic biliary ductal dilation. Cholelithiasis. Pancreas: No focal lesions or main ductal dilation. Spleen: Normal in size without focal abnormality. Adrenals/Urinary Tract: No adrenal nodules. Similar left hydroureteronephrosis to the level of the decompressed urinary bladder. New increased hyperattenuation of the distal left ureter (3:75). No right hydronephrosis. No suspicious renal masses or calculi. Urinary catheter in-situ. Stomach/Bowel: Normal appearance of the stomach. No bowel wall thickening. Mildly dilated rectum contains inspissated stool. Normal appendix. Vascular/Lymphatic: Aortic atherosclerosis. No enlarged  abdominal or pelvic lymph nodes. Reproductive: Prostate is unremarkable. Other: Increased small volume ascites. No free air or fluid collection. Musculoskeletal: No acute or abnormal lytic or blastic osseous lesions. Diffuse body wall edema. Previously noted subcutaneous emphysema is no longer seen in the partially imaged pubic region. IMPRESSION: 1. Similar left hydroureteronephrosis to the level of the decompressed urinary bladder. New increased hyperattenuation of the distal left ureter, which may represent blood products or mural thickening. Recommend consultation to Urology with cystoscopic evaluation as clinically indicated. 2. Increased large bilateral pleural effusions. Increased small volume ascites. 3. Mildly dilated rectum contains inspissated stool. 4. Previously noted subcutaneous emphysema is no longer seen in the partially imaged pubic region. 5. Aortic Atherosclerosis (ICD10-I70.0). Coronary artery calcifications. Assessment for potential risk factor modification, dietary therapy or pharmacologic therapy may be warranted, if clinically indicated. Electronically Signed   By: Agustin Cree M.D.   On: 06/04/2023 08:55   CT Head Wo Contrast  Result Date: 06/04/2023 CLINICAL DATA:  Head trauma. EXAM: CT HEAD WITHOUT CONTRAST TECHNIQUE: Contiguous axial images were obtained from the base of the skull through the vertex without intravenous contrast. RADIATION DOSE REDUCTION: This exam was performed according to the departmental dose-optimization program which includes automated exposure control, adjustment of the mA and/or kV according to patient size and/or use of iterative reconstruction technique. COMPARISON:  CT head without contrast 05/06/2023. MR head without and with contrast 05/06/2023. 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 Associated volume loss is noted. No acute infarct, hemorrhage, or  mass lesion is present. The ventricles are of normal size. No significant extraaxial fluid collection is present. The brainstem and cerebellum are within normal limits. Midline structures are within normal limits. Vascular: Atherosclerotic calcifications are present within the cavernous internal carotid arteries bilaterally and at the dural margin of both vertebral arteries. No hyperdense vessel is present. Skull: Left parietal scalp soft tissue swelling is present. No underlying fracture is present. Calvarium is intact. Sinuses/Orbits: The paranasal sinuses and mastoid air cells are clear. A right lens replacement is present. Globes and orbits are within normal limits. IMPRESSION: 1. Left parietal scalp soft tissue swelling without underlying fracture. 2. No acute intracranial abnormality or significant interval change. 3. 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 Electronically Signed   By: Marin Roberts M.D.   On: 06/04/2023 08:47   DG Pelvis Portable  Result  Date: 06/04/2023 CLINICAL DATA:  Level 2 fall on blood thinners. EXAM: PORTABLE PELVIS 1 VIEWS COMPARISON:  Pelvis MRI 04/04/2023 FINDINGS: Limited by underpenetration and leftward rotation. No fracture, dislocation, or gross erosion. IMPRESSION: Limited study without acute fracture or hip dislocation. Electronically Signed   By: Tiburcio Pea M.D.   On: 06/04/2023 06:38   DG Chest Portable 1 View  Result Date: 06/04/2023 CLINICAL DATA:  Level 2 trauma.  Fall.  Infection. EXAM: PORTABLE CHEST 1 VIEW COMPARISON:  05/06/2023 FINDINGS: The cardio pericardial silhouette is enlarged. Patchy mid and lower lung predominant airspace disease seen bilaterally. Left pleural effusion noted. No acute bony abnormality. Degenerative changes evident left shoulder. IMPRESSION: Patchy mid and lower lung predominant airspace disease with left pleural effusion. Imaging  features could be related to edema or infection. Electronically Signed   By: Kennith Center M.D.   On: 06/04/2023 06:32    ------  Elmon Kirschner, NP Pager: [phone]   Please contact the urology consult pager with any further questions/concerns.

## 2023-06-04 NOTE — ED Notes (Signed)
Attempted IV and Blood collection x3; unsuccessful. IV team consult placed

## 2023-06-04 NOTE — ED Provider Notes (Signed)
EMERGENCY DEPARTMENT AT [facility] Provider Note   CSN: [id number] Arrival date & time: 06/04/23  0544     History  Chief Complaint  Patient presents with   Peter Yang is a 67 y.o. male.  HPI   Patient with medical history including Fournier's gangrene, status post debridement, Foley cath in place, CAD, diabetes, NSTEMI, BPH, A-fib currently on Eliquis, presenting after a fall.  Patient states that he was leaning on his bed and fell onto his left side.  States he did strike his head but denies loss of conscious, patient has a slight headache, but denies any neck pain back pain chest pain abdominal pain pain in the upper and/or lower extremities.  He is having no other complaints, denying fevers chills cough congestion, denies any send pain nausea or vomiting, does endorse bloody urine since he was discharged from the hospital.  Reviewing patient's chart underwent 2 debridements for fournier, by Dr. Linzie Collin, had PICC line, and finishes antibiotics on 07/30, PICC line was removed 8 days ago.  Patient's Foley catheter due to BPH as well as recent surgery, supposed follow-up with urology on the 20th.  Home Medications Prior to Admission medications   Medication Sig Start Date End Date Taking? Authorizing Provider  acetaminophen (TYLENOL) 325 MG tablet Take 650 mg by mouth every 4 (four) hours as needed for mild pain or moderate pain.    [provider]  acetaminophen (TYLENOL) 500 MG tablet Take 2 tablets (1,000 mg total) by mouth every 8 (eight) hours. Patient taking differently: Take 1,000 mg by mouth every 8 (eight) hours as needed for mild pain or moderate pain. 04/18/23   Joseph Art, DO  albuterol (PROVENTIL) (2.5 MG/3ML) 0.083% nebulizer solution Take 2.5 mg by nebulization every 4 (four) hours as needed for wheezing or shortness of breath.    [provider]  albuterol (VENTOLIN HFA) 108 (90 Base) MCG/ACT inhaler Inhale 2 puffs  into the lungs every 4 (four) hours as needed for wheezing or shortness of breath.    [provider]  Amino Acids-Protein Hydrolys (FEEDING SUPPLEMENT, PRO-STAT SUGAR FREE 64,) LIQD Take 30 mLs by mouth daily.    [provider]  apixaban (ELIQUIS) 5 MG TABS tablet Take 1 tablet (5 mg total) by mouth 2 (two) times daily. 08/16/22 05/06/23  Levin Erp, MD  atorvastatin (LIPITOR) 80 MG tablet Take 0.5 tablets (40 mg total) by mouth every evening. Patient taking differently: Take 80 mg by mouth daily. 04/18/23   Joseph Art, DO  B Complex-C (B-COMPLEX WITH VITAMIN C) tablet Take 1 tablet by mouth daily. 10/24/22   Lanae Boast, MD  bisacodyl (DULCOLAX) 10 MG suppository Place 1 suppository (10 mg total) rectally daily as needed for moderate constipation. Patient taking differently: Place 10 mg rectally See admin instructions. Every 2-4 hours as needed for constipation. 10/24/22   Lanae Boast, MD  diclofenac Sodium (VOLTAREN) 1 % GEL Apply 1 g topically daily. Apply to bilateral knees 07/20/21   [provider]  docusate sodium (COLACE) 100 MG capsule Take 1 capsule (100 mg total) by mouth 2 (two) times daily. 04/18/23   Joseph Art, DO  feeding supplement (ENSURE ENLIVE / ENSURE PLUS) LIQD Take 237 mLs by mouth 3 (three) times daily between meals. Patient not taking: Reported on 04/02/2023 10/24/22   Lanae Boast, MD  ferrous sulfate 325 (65 FE) MG tablet Take 1 tablet (325 mg total) by mouth daily with  breakfast. 10/25/22   Lanae Boast, MD  finasteride (PROSCAR) 5 MG tablet Take 5 mg by mouth daily.    [provider]  fluconazole (DIFLUCAN) 100 MG tablet Take 100 mg by mouth daily.    [provider]  folic acid (FOLVITE) 1 MG tablet Take 1 tablet (1 mg total) by mouth daily. 05/14/23   Leroy Sea, MD  gabapentin (NEURONTIN) 300 MG capsule Take 1 capsule (300 mg total) by mouth at bedtime. 04/18/23   Joseph Art, DO  HUMALOG KWIKPEN 100 UNIT/ML  KwikPen Inject 0-15 Units into the skin with breakfast, with lunch, and with evening meal. Sliding scale: If 0-100 = 0 units <60 call MD; 101-150= 3 units; 151-200= 4 units; 201-250= 7 units; 251-300= 9 units; 301-350= 12 units; 351-400= 15 units > 400 call MD. 04/22/23   [provider]  hydrocortisone (CORTEF) 5 MG tablet Take 1 tablet (5 mg total) by mouth 2 (two) times daily. 04/18/23   Joseph Art, DO  insulin glargine-yfgn (SEMGLEE) 100 UNIT/ML injection Inject 0.2 mLs (20 Units total) into the skin at bedtime. 04/18/23   Marlin Canary U, DO  melatonin 5 MG TABS Take 1 tablet (5 mg total) by mouth daily at 6 PM. Patient taking differently: Take 5 mg by mouth at bedtime. 10/24/22   Lanae Boast, MD  midodrine (PROAMATINE) 10 MG tablet Take 1 tablet (10 mg total) by mouth with breakfast, with lunch, and with evening meal. 04/18/23   Marlin Canary U, DO  nystatin (MYCOSTATIN/NYSTOP) powder Apply topically 3 (three) times daily. 05/13/23   Leroy Sea, MD  oxyCODONE (OXY IR/ROXICODONE) 5 MG immediate release tablet Take 1 tablet (5 mg total) by mouth every 4 (four) hours as needed for moderate pain, severe pain or breakthrough pain. 05/13/23   Leroy Sea, MD  Oxycodone HCl 10 MG TABS Take 1.5 tablets (15 mg total) by mouth every 12 (twelve) hours. 05/13/23   Leroy Sea, MD  OXYGEN Inhale 2 L into the lungs continuous. To maintain stats above 90%  PRN order: use as needed for shortness of breath while laying flat on exertion    [provider]  pantoprazole (PROTONIX) 40 MG tablet Take 1 tablet (40 mg total) by mouth daily. 06/30/22   Pokhrel, Rebekah Chesterfield, MD  saccharomyces boulardii (FLORASTOR) 250 MG capsule Take 250 mg by mouth 2 (two) times daily.    [provider]  senna-docusate (SENOKOT-S) 8.6-50 MG tablet Take 1 tablet by mouth 2 (two) times daily. 10/24/22   Lanae Boast, MD  sodium hypochlorite (DAKIN'S 1/4 STRENGTH) 0.125 % SOLN Apply 1 Application topically  daily.    [provider]  tamsulosin (FLOMAX) 0.4 MG CAPS capsule Take 0.4 mg by mouth daily. 07/29/22   [provider]      Allergies    Sglt2 inhibitors    Review of Systems   Review of Systems  Constitutional:  Negative for chills and fever.  Respiratory:  Negative for shortness of breath.   Cardiovascular:  Negative for chest pain.  Gastrointestinal:  Negative for abdominal pain.  Neurological:  Positive for headaches.    Physical Exam Updated Vital Signs BP 112/72 (BP Location: Right Arm)   Temp (!) 97.5 F (36.4 C) (Axillary)  Physical Exam Vitals and nursing note reviewed. Exam conducted with a chaperone present.  Constitutional:      General: He is not in acute distress.    Appearance: He is not ill-appearing.  HENT:  Head: Normocephalic and atraumatic.     Comments: Small abrasion on the left upper parietal lobe, no raccoon eyes or Battle sign noted.    Nose: No congestion.     Mouth/Throat:     Mouth: Mucous membranes are moist.     Pharynx: Oropharynx is clear. No oropharyngeal exudate or posterior oropharyngeal erythema.     Comments: No trismus no torticollis no oral trauma present. Eyes:     Extraocular Movements: Extraocular movements intact.     Conjunctiva/sclera: Conjunctivae normal.     Pupils: Pupils are equal, round, and reactive to light.  Cardiovascular:     Rate and Rhythm: Normal rate and regular rhythm.     Pulses: Normal pulses.     Heart sounds: No murmur heard.    No friction rub. No gallop.  Pulmonary:     Effort: No respiratory distress.     Breath sounds: No wheezing, rhonchi or rales.     Comments: Patient small abrasion on his chest, appears to be chronic, no evidence of trauma on exam chest is nontender to palpation lung sounds clear bilaterally Abdominal:     Palpations: Abdomen is soft.     Tenderness: There is no abdominal tenderness. There is no right CVA tenderness or left CVA tenderness.     Comments: No  evidence of trauma, abdomen is soft nontender.  Foley catheter present, backfilled with gross hematuria  Genitourinary:    Comments: Chaperone present, genitals were visualized, noncircumcised penis, with Foley catheter placed, there is blood noted around the meatus, scrotum is indurated, with erythema, sutures are intact on the right scrotum, on the left scrotum sutures have dehisced, with purulent discharge coming from the area.  Please see picture for full detail Musculoskeletal:     Comments: Spine palpated is nontender to palpation no step-off deformities no no pelvis instability no leg shortening.  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  Skin:    General: Skin is warm and dry.  Neurological:     Mental Status: He is alert.  Psychiatric:        Mood and Affect: Mood normal.        ED Results / Procedures / Treatments   Labs (all labs ordered are listed, but only abnormal results are displayed) Labs Reviewed  CULTURE, BLOOD (ROUTINE X 2)  CULTURE, BLOOD (ROUTINE X 2)  CBC WITH DIFFERENTIAL/PLATELET  URINALYSIS, W/ REFLEX TO CULTURE (INFECTION SUSPECTED)  COMPREHENSIVE METABOLIC PANEL  PROTIME-INR  APTT  BRAIN NATRIURETIC PEPTIDE  I-STAT CG4 LACTIC ACID, ED    EKG None  Radiology DG Pelvis Portable  Result Date: 06/04/2023 CLINICAL DATA:  Level 2 fall on blood thinners. EXAM: PORTABLE PELVIS 1 VIEWS COMPARISON:  Pelvis MRI 04/04/2023 FINDINGS: Limited by underpenetration and leftward rotation. No fracture, dislocation, or gross erosion. IMPRESSION: Limited study without acute fracture or hip dislocation. Electronically Signed   By: Tiburcio Pea M.D.   On: 06/04/2023 06:38   DG Chest Portable 1 View  Result Date: 06/04/2023 CLINICAL DATA:  Level 2 trauma.  Fall.  Infection. EXAM: PORTABLE CHEST 1 VIEW COMPARISON:  05/06/2023 FINDINGS: The cardio pericardial silhouette is enlarged.  Patchy mid and lower lung predominant airspace disease seen bilaterally. Left pleural effusion noted. No acute bony abnormality. Degenerative changes evident left shoulder. IMPRESSION: Patchy mid and lower lung predominant airspace disease with left pleural effusion. Imaging features could be related to edema or infection. Electronically  Signed   By: Kennith Center M.D.   On: 06/04/2023 06:32    Procedures Procedures    Medications Ordered in ED Medications - No data to display  ED Course/ Medical Decision Making/ A&P                                 Medical Decision Making Amount and/or Complexity of Data Reviewed Labs: ordered. Radiology: ordered. ECG/medicine tests: ordered.  This patient presents to the ED for concern of fall, this involves an extensive number of treatment options, and is a complaint that carries with it a high risk of complications and morbidity.  The differential diagnosis includes cranial bleed, thoracic/abdominal trauma, orthopedic injury    Additional history obtained:  Additional history obtained from N/A External records from outside source obtained and reviewed including recent hospitalization, urology notes   Co morbidities that complicate the patient evaluation  A-fib on  Eliquis  Social Determinants of Health:  Geriatric    Lab Tests:  I Ordered, and personally interpreted labs.  The pertinent results include: Pending   Imaging Studies ordered:  I ordered imaging studies including CT head, C-spine, CT chest abdomen pelvis, plain film chest and pelvis I independently visualized and interpreted imaging which showed CT scans pending, plain film of chest reveals left-sided pleural effusion I agree with the radiologist interpretation   Cardiac Monitoring:  The patient was maintained on a cardiac monitor.  I personally viewed and interpreted the cardiac monitored which showed an underlying rhythm of: N/A   Medicines ordered and  prescription drug management:  I ordered medication including N/A I have reviewed the patients home medicines and have made adjustments as needed  Critical Interventions:  N/A N/A   Reevaluation:  Presents after a fall, will obtain imaging of the head and neck for further evaluation, patient has discharge coming from the left surgical site over the groin which has dehisced, there is purulent discharge, borderline tachycardic, will obtain sepsis workup, as well as CT imaging for further evaluation concern for possible overlying cellulitis versus deep tissue infection.  Consultations Obtained: N/A    Test Considered:  N/A   Dispostion and problem list  Due to shift change patient handoff to [facility]  Recommend following up on CT scans, would consult with urology to see how they like to manage likely infection of the surgical site.              Final Clinical Impression(s) / ED Diagnoses Final diagnoses:  Fall, initial encounter    Rx / DC Orders ED Discharge Orders     None         Carroll Sage, PA-C 06/04/23 8469    Gilda Crease, MD 06/06/23 0700

## 2023-06-04 NOTE — ED Provider Notes (Signed)
Sign out received on this 67 year old male who presented as a fall on thinners.  See HPI from well PA-C for full details.  He was discovered to have hematuria and on further exam found to have purulent drainage from his scrotal incision site.  Has history of Fournier's gangrene status post debridement on 6/19.  Currently he is awaiting CT abdomen pelvis with contrast.  Physical Exam  BP 113/85   Pulse 96   Temp (!) 97.5 F (36.4 C) (Axillary)   Resp 15   SpO2 100%     Procedures  Procedures  ED Course / MDM    Medical Decision Making Amount and/or Complexity of Data Reviewed Labs: ordered. Radiology: ordered. ECG/medicine tests: ordered.  Risk Prescription drug management.   CT head and cervical spine without evidence of acute intracranial or cervical finding.  C-collar removed.  CT abdomen pelvis with contrast shows new increased hypoattenuation of the distal left ureter.  This may represent blood products.  No evidence of Fournier's gangrene.  Discussed with urology.  They will evaluate patient.  They do recommend admission and IV antibiotics.  Will discuss with hospitalist.  Discussed with hospitalist. They will evaluate for admission.        Marita Kansas, PA-C 06/04/23 1034    Gloris Manchester, MD 06/04/23 [phone]

## 2023-06-04 NOTE — Plan of Care (Signed)
Patient is AOX4. Able to use call bell appropriately. Multiple wounds throughout body. Chronic foley remains in place with good urine output. No hematuria noted. Mild pain. PRN medication effective. Floor padding in place. Patient has been educated on fall risk and prevention. Tele: Afib controlled. RA. VSS. Prevlon boot to right foot.   Problem: Education: Goal: Knowledge of General Education information will improve Description: Including pain rating scale, medication(s)/side effects and non-pharmacologic comfort measures Outcome: Progressing   Problem: Health Behavior/Discharge Planning: Goal: Ability to manage health-related needs will improve Outcome: Progressing   Problem: Clinical Measurements: Goal: Ability to maintain clinical measurements within normal limits will improve Outcome: Progressing Goal: Will remain free from infection Outcome: Progressing Goal: Diagnostic test results will improve Outcome: Progressing Goal: Respiratory complications will improve Outcome: Progressing Goal: Cardiovascular complication will be avoided Outcome: Progressing   Problem: Activity: Goal: Risk for activity intolerance will decrease Outcome: Progressing   Problem: Nutrition: Goal: Adequate nutrition will be maintained Outcome: Progressing   Problem: Coping: Goal: Level of anxiety will decrease Outcome: Progressing   Problem: Elimination: Goal: Will not experience complications related to bowel motility Outcome: Progressing Goal: Will not experience complications related to urinary retention Outcome: Progressing   Problem: Pain Managment: Goal: General experience of comfort will improve Outcome: Progressing   Problem: Safety: Goal: Ability to remain free from injury will improve Outcome: Progressing   Problem: Skin Integrity: Goal: Risk for impaired skin integrity will decrease Outcome: Progressing   Problem: Education: Goal: Ability to demonstrate management of disease  process will improve Outcome: Progressing Goal: Ability to verbalize understanding of medication therapies will improve Outcome: Progressing Goal: Individualized Educational Video(s) Outcome: Progressing   Problem: Activity: Goal: Capacity to carry out activities will improve Outcome: Progressing   Problem: Cardiac: Goal: Ability to achieve and maintain adequate cardiopulmonary perfusion will improve Outcome: Progressing

## 2023-06-05 DIAGNOSIS — E876 Hypokalemia: Secondary | ICD-10-CM | POA: Diagnosis not present

## 2023-06-05 DIAGNOSIS — R569 Unspecified convulsions: Secondary | ICD-10-CM | POA: Diagnosis not present

## 2023-06-05 LAB — COMPREHENSIVE METABOLIC PANEL
ALT: 11 U/L (ref 0–44)
AST: 16 U/L (ref 15–41)
Albumin: 2.5 g/dL — ABNORMAL LOW (ref 3.5–5.0)
Alkaline Phosphatase: 68 U/L (ref 38–126)
Anion gap: 10 (ref 5–15)
BUN: 15 mg/dL (ref 8–23)
CO2: 21 mmol/L — ABNORMAL LOW (ref 22–32)
Calcium: 8.3 mg/dL — ABNORMAL LOW (ref 8.9–10.3)
Chloride: 104 mmol/L (ref 98–111)
Creatinine, Ser: 0.96 mg/dL (ref 0.61–1.24)
GFR, Estimated: >60 mL/min (ref >60)
Glucose, Bld: 169 mg/dL — ABNORMAL HIGH (ref 70–99)
Potassium: 3.4 mmol/L — ABNORMAL LOW (ref 3.5–5.1)
Sodium: 135 mmol/L (ref 135–145)
Total Bilirubin: 1 mg/dL (ref 0.3–1.2)
Total Protein: 5 g/dL — ABNORMAL LOW (ref 6.5–8.1)

## 2023-06-05 LAB — CBC
HCT: 31.6 % — ABNORMAL LOW (ref 39.0–52.0)
Hemoglobin: 9.5 g/dL — ABNORMAL LOW (ref 13.0–17.0)
MCH: 26 pg (ref 26.0–34.0)
MCHC: 30.1 g/dL (ref 30.0–36.0)
MCV: 86.3 fL (ref 80.0–100.0)
Platelets: 155 10*3/uL (ref 150–400)
RBC: 3.66 MIL/uL — ABNORMAL LOW (ref 4.22–5.81)
RDW: 25.2 % — ABNORMAL HIGH (ref 11.5–15.5)
WBC: 6.8 10*3/uL (ref 4.0–10.5)
nRBC: 0 % (ref 0.0–0.2)

## 2023-06-05 LAB — GLUCOSE, CAPILLARY
Glucose-Capillary: 136 mg/dL — ABNORMAL HIGH (ref 70–99)
Glucose-Capillary: 140 mg/dL — ABNORMAL HIGH (ref 70–99)
Glucose-Capillary: 109 mg/dL — ABNORMAL HIGH (ref 70–99)
Glucose-Capillary: 148 mg/dL — ABNORMAL HIGH (ref 70–99)

## 2023-06-05 LAB — MRSA NEXT GEN BY PCR, NASAL: MRSA by PCR Next Gen: DETECTED — AB

## 2023-06-05 MED ORDER — APIXABAN 5 MG PO TABS
5.0000 mg | ORAL_TABLET | Freq: Two times a day (BID) | ORAL | Status: DC
  Administered 2023-06-05 – 2023-06-07 (×5): 5 mg via ORAL
  Filled 2023-06-05 (×5): qty 1

## 2023-06-05 MED ORDER — POTASSIUM CHLORIDE CRYS ER 20 MEQ PO TBCR
40.0000 meq | EXTENDED_RELEASE_TABLET | Freq: Once | ORAL | Status: AC
  Administered 2023-06-05: 40 meq via ORAL
  Filled 2023-06-05: qty 2

## 2023-06-05 MED ORDER — VANCOMYCIN HCL 1250 MG/250ML IV SOLN
1250.0000 mg | Freq: Two times a day (BID) | INTRAVENOUS | Status: DC
  Administered 2023-06-05 – 2023-06-08 (×7): 1250 mg via INTRAVENOUS
  Filled 2023-06-05 (×8): qty 250

## 2023-06-05 MED ORDER — MUPIROCIN 2 % EX OINT
1.0000 | TOPICAL_OINTMENT | Freq: Two times a day (BID) | CUTANEOUS | Status: AC
  Administered 2023-06-05 – 2023-06-10 (×9): 1 via NASAL
  Filled 2023-06-05 (×2): qty 22

## 2023-06-05 MED ORDER — SODIUM CHLORIDE 0.9 % IV SOLN
2.0000 g | Freq: Three times a day (TID) | INTRAVENOUS | Status: DC
  Administered 2023-06-05 – 2023-06-07 (×6): 2 g via INTRAVENOUS
  Filled 2023-06-05 (×6): qty 12.5

## 2023-06-05 MED ORDER — CHLORHEXIDINE GLUCONATE CLOTH 2 % EX PADS
6.0000 | MEDICATED_PAD | Freq: Every day | CUTANEOUS | Status: AC
  Administered 2023-06-06 – 2023-06-10 (×5): 6 via TOPICAL

## 2023-06-05 MED ORDER — SENNOSIDES-DOCUSATE SODIUM 8.6-50 MG PO TABS
1.0000 | ORAL_TABLET | Freq: Two times a day (BID) | ORAL | Status: DC
  Administered 2023-06-05 – 2023-06-19 (×25): 1 via ORAL
  Filled 2023-06-05 (×27): qty 1

## 2023-06-05 NOTE — Progress Notes (Signed)
Pharmacy Antibiotic Note  Peter Yang is a 67 y.o. male admitted on 06/04/2023 with UTI and scrotal wound infection .  Pharmacy has been consulted for vancomycin and cefepime dosing. He had Fournier's gangrene 03/2023 and is s/p debridement and antibiotics. He had vancomycin 2500 mg IV load early this morning. He also had Zosyn 3.375 g IV on 8/14 at 13:58.   He is afebrile, WBC are normal, and renal function is normal. UCx growing Pseudomonas with sensitivities pending (foley in place).   Plan: Vancomycin 1250 mg IV q12h Goal AUC 400-550, eAUC 533.6, SCr used 0.96, Vd 0.5  Cefepime 2 g IV q8h Monitor renal function, clinical progress, cultures/sensitivities F/U LOT and de-escalate as able Vancomycin levels as clinically indicated   Height: 6\' 2"  (188 cm) Weight: 121 kg (266 lb 12.1 oz) IBW/kg (Calculated) : 82.2  Temp (24hrs), Avg:98.1 F (36.7 C), Min:97.6 F (36.4 C), Max:98.4 F (36.9 C)  Recent Labs  Lab 06/04/23 0644 06/04/23 0646 06/04/23 0650 06/04/23 0950 06/05/23 0121  WBC 9.3  --   --   --  6.8  CREATININE  --  0.92  --   --  0.96  LATICACIDVEN  --   --  2.1* 1.8  --     Estimated Creatinine Clearance: 104.6 mL/min (by C-G formula based on SCr of 0.96 mg/dL).    Allergies  Allergen Reactions   Sglt2 Inhibitors Other (See Comments)    Fournier's gangrene    Antimicrobials this admission: Vancomycin 8/15 >>  Zosyn 8/14 x1 Cefepime 8/15 >>  Dose adjustments this admission:   Microbiology results: 8/14 BCx: ngtd 8/14 UCx: PSA (sens pending)    Thank you for involving pharmacy in this patient's care.  Loura Back, PharmD, BCPS Clinical Pharmacist Clinical phone for 06/05/2023 is [phone] 06/05/2023 2:43 PM

## 2023-06-05 NOTE — Plan of Care (Signed)
Patient AOX4. Wound care done this shift. Turn Q2H. Tolerating diet. Drainage from scrotum. Foley maintained. Good urine output. Continues to have generalized edema. IV ABX as ordered.   Problem: Education: Goal: Knowledge of General Education information will improve Description: Including pain rating scale, medication(s)/side effects and non-pharmacologic comfort measures Outcome: Progressing   Problem: Health Behavior/Discharge Planning: Goal: Ability to manage health-related needs will improve Outcome: Progressing   Problem: Clinical Measurements: Goal: Ability to maintain clinical measurements within normal limits will improve Outcome: Progressing Goal: Will remain free from infection Outcome: Progressing Goal: Diagnostic test results will improve Outcome: Progressing Goal: Respiratory complications will improve Outcome: Progressing Goal: Cardiovascular complication will be avoided Outcome: Progressing   Problem: Activity: Goal: Risk for activity intolerance will decrease Outcome: Progressing   Problem: Nutrition: Goal: Adequate nutrition will be maintained Outcome: Progressing   Problem: Coping: Goal: Level of anxiety will decrease Outcome: Progressing   Problem: Elimination: Goal: Will not experience complications related to bowel motility Outcome: Progressing Goal: Will not experience complications related to urinary retention Outcome: Progressing   Problem: Pain Managment: Goal: General experience of comfort will improve Outcome: Progressing   Problem: Safety: Goal: Ability to remain free from injury will improve Outcome: Progressing   Problem: Skin Integrity: Goal: Risk for impaired skin integrity will decrease Outcome: Progressing   Problem: Education: Goal: Ability to demonstrate management of disease process will improve Outcome: Progressing Goal: Ability to verbalize understanding of medication therapies will improve Outcome: Progressing Goal:  Individualized Educational Video(s) Outcome: Progressing   Problem: Activity: Goal: Capacity to carry out activities will improve Outcome: Progressing   Problem: Cardiac: Goal: Ability to achieve and maintain adequate cardiopulmonary perfusion will improve Outcome: Progressing

## 2023-06-05 NOTE — TOC Initial Note (Signed)
Transition of Care [facility]) - Initial/Assessment Note    Patient Details  Name: Peter Yang MRN: [id number] Date of Birth: 12-26-1955  Transition of Care [facility]) CM/SW Contact:    Carley Hammed, LCSW Phone Number: 06/05/2023, 1:50 PM  Clinical Narrative:                 TOC met with pt at bedside and pt confirms he wants to go back to [name] when medically ready. Pt is agreeable to [facility] contacting friend [name]. [name] was contacted with pt and was advised of plan, she is also in agreement. [name] notes pt is LTC, with no barriers to him returning. TOC is available for any further needs.  Expected Discharge Plan: Skilled Nursing Facility Barriers to Discharge: Continued Medical Work up   Patient Goals and CMS Choice     Choice offered to / list presented to : Patient      Expected Discharge Plan and Services     Post Acute Care Choice: Skilled Nursing Facility Living arrangements for the past 2 months: Skilled Nursing Facility                                      Prior Living Arrangements/Services Living arrangements for the past 2 months: Skilled Nursing Facility Lives with:: Facility Resident Patient language and need for interpreter reviewed:: Yes Do you feel safe going back to the place where you live?: Yes      Need for Family Participation in Patient Care: No (Comment) Care giver support system in place?: Yes (comment)   Criminal Activity/Legal Involvement Pertinent to Current Situation/Hospitalization: No - Comment as needed  Activities of Daily Living   ADL Screening (condition at time of admission) Patient's cognitive ability adequate to safely complete daily activities?: Yes Is the patient deaf or have difficulty hearing?: No Patient able to express need for assistance with ADLs?: Yes Does the patient have difficulty dressing or bathing?: Yes Independently performs ADLs?: Yes (appropriate for developmental age) Does the patient have  difficulty walking or climbing stairs?: Yes Weakness of Legs: Right Weakness of Arms/Hands: Both  Permission Sought/Granted Permission sought to share information with : Family Supports Permission granted to share information with : Yes, Verbal Permission Granted  Share Information with NAME: [name]  Permission granted to share info w AGENCY: [facility]  Permission granted to share info w Relationship: Friend     Emotional Assessment Appearance:: Appears stated age Attitude/Demeanor/Rapport: Engaged Affect (typically observed): Appropriate Orientation: : Oriented to Self, Oriented to Place, Oriented to  Time, Oriented to Situation Alcohol / Substance Use: Not Applicable Psych Involvement: No (comment)  Admission diagnosis:  Wound dehiscence in puerperium, perineal [O90.1] Fall, initial encounter [W19.XXXA] Patient Active Problem List   Diagnosis Date Noted   Wound dehiscence in puerperium, perineal 06/04/2023   AMS (altered mental status) 05/07/2023   Seizure-like activity (HCC) 05/06/2023   Altered mental status 05/06/2023   Fournier's gangrene in male 04/02/2023   Fournier disease 04/02/2023   Necrotizing soft tissue infection 04/02/2023   UTI (urinary tract infection) due to urinary indwelling catheter (HCC) 04/01/2023   S/P BKA (below knee amputation) unilateral, left (HCC) 04/01/2023   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) 04/01/2023   Long term current use of systemic steroids 04/01/2023   Thrombocytopenia (HCC) 10/01/2022   Orthostatic hypotension 09/24/2022   HFrEF (heart failure with reduced ejection fraction) (  HCC) 09/23/2022   Counseling and coordination of care 09/15/2022   Pain 09/14/2022   High risk medication use 09/14/2022   Need for emotional support 09/12/2022   Mood altered 09/11/2022   Medication management 09/11/2022   Palliative care by specialist 09/08/2022   Goals of care, counseling/discussion 09/08/2022   Physical  deconditioning 09/05/2022   Gross hematuria 08/31/2022   Type 2 diabetes mellitus with diabetic peripheral angiopathy without gangrene, without long-term current use of insulin (HCC)    Impaired functional mobility, balance, gait, and endurance 08/03/2022   Obstructive sleep apnea syndrome 08/03/2022   Pleural effusion, bilateral 08/03/2022   Catheter-associated urinary tract infection (HCC) 08/03/2022   History of urinary retention 08/03/2022   Mitral valve regurgitation 08/03/2022   Thoracic ascending aortic aneurysm (HCC) 07/23/2022   Borderline low blood pressure determined by examination 07/04/2022   Coronary artery disease due to lipid rich plaque 06/25/2022   Hematuria 06/25/2022   History of non-ST elevation myocardial infarction (NSTEMI) 06/15/2022   PAF (paroxysmal atrial fibrillation) (HCC) 06/15/2022   Dyslipidemia 06/15/2022   PVD (peripheral vascular disease) (HCC) 06/15/2022   Pressure ulcer of sacral region, stage 2 (HCC) 06/15/2022   Chronic HFrEF (heart failure with reduced ejection fraction) (HCC) 06/15/2022   DNR (do not resuscitate) 06/15/2022   Ischemic cardiomyopathy 02/12/2022   Need for assistance with personal care 08/03/2021   Diabetic polyneuropathy associated with type 2 diabetes mellitus (HCC) 07/28/2021   Prolonged QT interval 07/28/2021   Moderate protein-calorie malnutrition (HCC) 06/29/2021   Chronic insomnia 03/31/2021   BPH with obstruction/lower urinary tract symptoms 02/02/2018   Hyperlipidemia 04/15/2016   Vitamin D deficiency 04/09/2016   Essential hypertension 01/25/2016   PCP:  Patient, No Pcp Per Pharmacy:   [facility] - [place], [place] - [address] [place] [address] Phone: [phone] Fax: [phone]     Social Determinants of Health (SDOH) Social History: SDOH Screenings   Food Insecurity: No Food Insecurity (06/04/2023)  Housing: Low Risk  (06/04/2023)  Transportation Needs:  No Transportation Needs (06/04/2023)  Utilities: Not At Risk (06/04/2023)  Financial Resource Strain: Low Risk  (08/13/2018)   Received from [facility] visits prior to 12/21/2022., [facility] visits prior to 12/21/2022.  Tobacco Use: Low Risk  (06/04/2023)   SDOH Interventions:     Readmission Risk Interventions    05/07/2023    4:42 PM 10/24/2022   11:21 AM 09/02/2022   10:36 AM  Readmission Risk Prevention Plan  Transportation Screening Complete Complete Complete  Medication Review Oceanographer)  Complete Complete  PCP or Specialist appointment within 3-5 days of discharge Complete Complete Complete  HRI or Home Care Consult Complete Complete Complete  SW Recovery Care/Counseling Consult Complete Complete Complete  Palliative Care Screening Not Applicable Not Applicable Not Applicable  Skilled Nursing Facility Complete Complete Complete

## 2023-06-05 NOTE — Progress Notes (Signed)
[facility] PROGRESS NOTE  Peter Yang (DOB: 1956-07-28) ZOX:[id number] PCP: Patient, No Pcp Per Outpatient Specialists: [facility], Dr. Drue Second  Brief Narrative: Peter Yang is a 67 y.o. male with a history of T2DM with polyneuropathy, DFU s/p left BKA, CAD s/p stent, HLD, PAF, HFrEF, adrenal insufficiency, CVA, BPH with chronic foley, and recent surgeries for Fournier's gangrene June 2024 having completed IV antibiotics 7/30 who presented to the ED on 06/04/2023 after falling out of bed at [facility]. He was afebrile with dehiscence of left scrotal wound with discharge. Work up showed WBC 9.3k, ESR 4, PCT <0.10, CRP 2.9 (down from 20.7 on 8/7), lactic acid 2.1 > 1.8. IV antibiotics were given and urology consulted.  Subjective: No significant pain at rest, there is significant pain with any movement. No new issues, denies dyspnea  Objective: BP 105/68 (BP Location: Right Arm)   Pulse 75   Temp 97.8 F (36.6 C) (Oral)   Resp 17   Ht 6\' 2"  (1.88 m)   Wt 121 kg   SpO2 98%   BMI 34.25 kg/m   Gen: Frail large male in no acute distress Pulm: Diminished at bases without crackles or wheezes, nonlabored on room air  CV: Irreg irreg, no JVD. Diffuse pitting edema throughout GI: Soft, NT, ND, +BS  GU: Retracted penis with foley in place, dried blood. Light yellow urine in bag. Light pink scrotum with healed right surgical incision with punctum inferiorly without discharge. Left surgical incision has dehisced, some slough under foam dressing, no odor.  Neuro: Alert and oriented. No new focal deficits. Ext: Warm, dry, left BKA Skin: R foot wound dressing c/d/I. Scrotum as above. Diffusely pale.   Assessment & Plan: Anasarca, acute on chronic combined HFrEF, bilateral pleural effusions: Diffuse body edema also attributable to hypoalbuminemia/malnutrition.  - Respiratory status is quite stable. Will monitor effusions while diuresing.  - Continue lasix 40mg  IV BID, monitor strict I/O (4.75L UOP thus  far), Cr stable, monitor daily weights (inaccurate thus far). If BP does not tolerate, consider albumin coadministration. - RD consult, supplement protein  Scrotal surgical wound infection s/p debridement x2 for Fournier's gangrene:  - Continue MRSA coverage with vancomycin and restart cefepime as below.  - Monitor wound culture. This shows GNRs, not finalized. Monitor blood cultures. Inflammatory markers overall are reassuring.  - Wound care deferred to urology, has removed sutures.   CAUTI:  - Urology to exchange foley, last placed 7/22 with great difficulty. We're diuresing hoping to improve things - Urine culture with Pseudomonas, start cefepime pending susceptibilities. Was given zosyn.   Hematuria: Chronic but currently resolved.  - Ok to continue eliquis  Right foot wound: No exudate or odor at this time.  - Continue regular podiatry follow up, optimize nutritional status and glycemic control.  - Keep covered, continue daily wound care per WOC.  - Offload with prevalon boot  Stage 2 sacral pressure injury: POA:  - Offload, foam dressing w/medihoney.  IDT2DM: Last HbA1c 7% shows adequate glycemic control to assist wound healing.  - Continue glargine and SSI  PAF: In atrial fibrillation with CVR.   - Continue eliquis, not on rate-control agent at baseline.  Tinea cruris:  - Continue nystatin powder.   Chronic pain:  - No changes  Hypokalemia:  - supplement and monitor, check Mg in AM as well  Chronic blood loss anemia: Above previous baseline, even after hemodilution. No active bleeding noted.  - Monitor intermittently.  - Continue iron supplement with bowel regimen  Adrenal  insufficiency:  - Continue home dose cortef, continue baseline dose midodrine. Low threshold for stress dose  Hydronephrosis, BPH:  - Renal function stable, defer management to urology. - Continue tamsulosin, finasteride, and foley  Fall out of bed: Negative CT head, cervical spine. - Fall  precautions  HLD:  - Continue statin  Depression:  - Continue escitalopram  Peter Nine, MD [facility] [email or web address] 06/05/2023, 2:20 PM

## 2023-06-06 DIAGNOSIS — T8149XD Infection following a procedure, other surgical site, subsequent encounter: Secondary | ICD-10-CM | POA: Diagnosis not present

## 2023-06-06 DIAGNOSIS — L089 Local infection of the skin and subcutaneous tissue, unspecified: Secondary | ICD-10-CM | POA: Diagnosis not present

## 2023-06-06 DIAGNOSIS — Z794 Long term (current) use of insulin: Secondary | ICD-10-CM

## 2023-06-06 DIAGNOSIS — E119 Type 2 diabetes mellitus without complications: Secondary | ICD-10-CM

## 2023-06-06 DIAGNOSIS — Z8619 Personal history of other infectious and parasitic diseases: Secondary | ICD-10-CM | POA: Diagnosis not present

## 2023-06-06 DIAGNOSIS — J90 Pleural effusion, not elsewhere classified: Secondary | ICD-10-CM | POA: Diagnosis not present

## 2023-06-06 DIAGNOSIS — W06XXXD Fall from bed, subsequent encounter: Secondary | ICD-10-CM

## 2023-06-06 DIAGNOSIS — E440 Moderate protein-calorie malnutrition: Secondary | ICD-10-CM | POA: Insufficient documentation

## 2023-06-06 DIAGNOSIS — B965 Pseudomonas (aeruginosa) (mallei) (pseudomallei) as the cause of diseases classified elsewhere: Secondary | ICD-10-CM

## 2023-06-06 DIAGNOSIS — I480 Paroxysmal atrial fibrillation: Secondary | ICD-10-CM | POA: Diagnosis not present

## 2023-06-06 DIAGNOSIS — N1330 Unspecified hydronephrosis: Secondary | ICD-10-CM

## 2023-06-06 LAB — MAGNESIUM: Magnesium: 1.4 mg/dL — ABNORMAL LOW (ref 1.7–2.4)

## 2023-06-06 LAB — URINE CULTURE: Culture: >=100000 — AB

## 2023-06-06 LAB — BASIC METABOLIC PANEL
Anion gap: 8 (ref 5–15)
BUN: 17 mg/dL (ref 8–23)
CO2: 22 mmol/L (ref 22–32)
Calcium: 8 mg/dL — ABNORMAL LOW (ref 8.9–10.3)
Chloride: 103 mmol/L (ref 98–111)
Creatinine, Ser: 0.83 mg/dL (ref 0.61–1.24)
GFR, Estimated: >60 mL/min (ref >60)
Glucose, Bld: 199 mg/dL — ABNORMAL HIGH (ref 70–99)
Potassium: 3.6 mmol/L (ref 3.5–5.1)
Sodium: 133 mmol/L — ABNORMAL LOW (ref 135–145)

## 2023-06-06 LAB — GLUCOSE, CAPILLARY
Glucose-Capillary: 131 mg/dL — ABNORMAL HIGH (ref 70–99)
Glucose-Capillary: 113 mg/dL — ABNORMAL HIGH (ref 70–99)
Glucose-Capillary: 172 mg/dL — ABNORMAL HIGH (ref 70–99)
Glucose-Capillary: 195 mg/dL — ABNORMAL HIGH (ref 70–99)

## 2023-06-06 MED ORDER — ZINC SULFATE 220 (50 ZN) MG PO CAPS
220.0000 mg | ORAL_CAPSULE | Freq: Every day | ORAL | Status: DC
  Administered 2023-06-06 – 2023-06-19 (×13): 220 mg via ORAL
  Filled 2023-06-06 (×13): qty 1

## 2023-06-06 MED ORDER — VITAMIN A 3 MG (10000 UNIT) PO CAPS
10000.0000 [IU] | ORAL_CAPSULE | Freq: Every day | ORAL | Status: DC
  Administered 2023-06-06 – 2023-06-19 (×13): 10000 [IU] via ORAL
  Filled 2023-06-06 (×14): qty 1

## 2023-06-06 MED ORDER — VITAMIN C 500 MG PO TABS
500.0000 mg | ORAL_TABLET | Freq: Every day | ORAL | Status: DC
  Administered 2023-06-06 – 2023-06-19 (×13): 500 mg via ORAL
  Filled 2023-06-06 (×13): qty 1

## 2023-06-06 MED ORDER — MAGNESIUM SULFATE 2 GM/50ML IV SOLN
2.0000 g | Freq: Once | INTRAVENOUS | Status: AC
  Administered 2023-06-06: 2 g via INTRAVENOUS
  Filled 2023-06-06: qty 50

## 2023-06-06 MED ORDER — JUVEN PO PACK
1.0000 | PACK | Freq: Two times a day (BID) | ORAL | Status: DC
  Administered 2023-06-06 – 2023-06-19 (×20): 1 via ORAL
  Filled 2023-06-06 (×20): qty 1

## 2023-06-06 MED ORDER — POTASSIUM CHLORIDE CRYS ER 20 MEQ PO TBCR
40.0000 meq | EXTENDED_RELEASE_TABLET | Freq: Once | ORAL | Status: AC
  Administered 2023-06-06: 40 meq via ORAL
  Filled 2023-06-06: qty 2

## 2023-06-06 MED ORDER — ENSURE ENLIVE PO LIQD
237.0000 mL | Freq: Two times a day (BID) | ORAL | Status: DC
  Administered 2023-06-06 – 2023-06-12 (×10): 237 mL via ORAL

## 2023-06-06 MED ORDER — ADULT MULTIVITAMIN W/MINERALS CH
1.0000 | ORAL_TABLET | Freq: Every day | ORAL | Status: DC
  Administered 2023-06-06 – 2023-06-19 (×13): 1 via ORAL
  Filled 2023-06-06 (×13): qty 1

## 2023-06-06 NOTE — Progress Notes (Signed)
Initial Nutrition Assessment  DOCUMENTATION CODES:   Non-severe (moderate) malnutrition in context of chronic illness  INTERVENTION:  Multivitamin w/ minerals daily Ensure Enlive po BID, each supplement provides 350 kcal and 20 grams of protein. 1 packet Juven BID, each packet provides 95 calories, 2.5 grams of protein (collagen), and 9.8 grams of carbohydrate (3 grams sugar); also contains 7 grams of L-arginine and L-glutamine, 300 mg vitamin C, 15 mg vitamin E, 1.2 mcg vitamin B-12, 9.5 mg zinc, 200 mg calcium, and 1.5 g  Calcium Beta-hydroxy-Beta-methylbutyrate to support wound healing Liberalize pt diet to Carb Modified due to malnutrition and increased needs for healing.  Meal ordering with assist Encourage good PO intake  Start micronutrient supplementation due to chronic wounds, recommend rechecking after supplementation is complete.  Vitamin A 10,000 international units daily x 14 days Vitamin C 500 mg daily x 30 days Zinc 220 mg daily x 14 days   NUTRITION DIAGNOSIS:   Moderate Malnutrition related to chronic illness as evidenced by moderate fat depletion, moderate muscle depletion.  GOAL:   Patient will meet greater than or equal to 90% of their needs  MONITOR:   PO intake, Supplement acceptance, Labs, Skin  REASON FOR ASSESSMENT:   Consult Poor PO, Wound healing  ASSESSMENT:   67 y.o. male presented to the ED after a fall, hitting his head. PMH includes T2DM, HLD, BPH, CVA, s/p L BKA, HTN, CHF, and CAD. Pt admitted with acute on chronic heart failure, UTI, and Fournier's gangrene.   8/14 - Admitted   Met with pt in room. Endorses a good appetite currently and PTA, no changes in appetite. Shares that he has not received any breakfast yet this morning. Denies any nausea or vomiting. Does not drink any oral nutrition supplements at home, but is agreeable to Ensure and trying Juven to help promote wound healing.  Reports a UBW of ~300# (unsure the last time that he  weighed that much), remembers being near 250# a few weeks ago. Endorses noticing weight loss. Per weight history, pt has gained weight over the past year.   Pt with noted low Vitamin A level during previous admission, RD unable to find where pt received any supplementation. Reached out to MD to order re-check labs due to chronic non-healing wounds, MD would like to just start supplementation. RD to order.   Meal Intake 8/15: 75-100% x 3 meals   Medications reviewed and include: Ferrous Sulfate, Lasix, Novolog SSI, Semglee, Protonix, Potassium Chloride , Florastor, Senokot-S, IV antibiotics  Labs reviewed: Sodium 133, Potassium 3.6, Magnesium 1.4, HGB A1c 7.0 (04/02/23) CBG: 109-148 x 24 hrs   Micronutrient Labs CRP: 2.9 (high) Vitamin D: 28.96 (high - 04/11/23) Vitamin A: 10.7 (high - 04/11/23) Vitamin B12: 824 (WNL - 04/11/23) Copper: 114 (WNL - 04/11/23)  NUTRITION - FOCUSED PHYSICAL EXAM:  Flowsheet Row Most Recent Value  Orbital Region Moderate depletion  Upper Arm Region Moderate depletion  Thoracic and Lumbar Region No depletion  Buccal Region Mild depletion  Temple Region Moderate depletion  Clavicle Bone Region Moderate depletion  Clavicle and Acromion Bone Region Moderate depletion  Scapular Bone Region Moderate depletion  Dorsal Hand Severe depletion  Patellar Region Unable to assess  Anterior Thigh Region Unable to assess  Posterior Calf Region Unable to assess  Edema (RD Assessment) Moderate  Hair Reviewed  Eyes Reviewed  Mouth Reviewed  Skin Reviewed  Nails Reviewed   Diet Order:   Diet Order  Diet heart healthy/carb modified Room service appropriate? Yes; Fluid consistency: Thin  Diet effective now                  EDUCATION NEEDS:  Education needs have been addressed  Skin:  Skin Assessment: Skin Integrity Issues: Skin Integrity Issues:: Stage II, Other (Comment) Stage II: Sacrum Other: Right foot  Last BM:  8/15  Height:  Ht  Readings from Last 1 Encounters:  06/04/23 6\' 2"  (1.88 m)   Weight:  Wt Readings from Last 1 Encounters:  06/06/23 117 kg   Ideal Body Weight:  86.4 kg  BMI:  Body mass index is 33.12 kg/m. Adjusted BMI for L BKA: 34.2 kg/m2  Estimated Nutritional Needs:  Kcal:  2200-2400 Protein:  110-130 grams Fluid:  >/= 2 L   Kirby Crigler RD, LDN Clinical Dietitian See [facility] for contact information.

## 2023-06-06 NOTE — Evaluation (Signed)
Physical Therapy Evaluation Patient Details Name: Peter Yang MRN: [id number] DOB: [birth year] Today's Date: 06/06/2023  History of Present Illness  Pt is a 67 year old male admitted on 06/04/23 s/p fall with head strike after attempting to get remote that had fallen on floor. On evaluation in ED patient appeared well but was noted to have surgical wound dehiscence and poor healing of perineal with concern for infection. 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 Patient course further complicated by  recent diagnosis of Fournier's gangrene s/p debridement x 2 during hospitalization 6/12-6/19/2024 followed by course of iv daptomycin and ertapenem with end dated of 05/20/23.  Clinical Impression  Pt presents with admitting diagnosis above. Pt today was eating lunch upon arrival and agreeable to subjective history but declined mobility. Pt was A&Ox4 however per chart was not a great historian during previous admissions and appeared to be the case today. At baseline, pt has been a resident at [facility] since October 2023 and requires a hoyer lift to transfer to chair. Although pt declined mobility today it appears that pt is likely at baseline. Recommend pt return to SNF upon DC. PT will continue to follow.       If plan is discharge home, recommend the following: Two people to help with walking and/or transfers;A lot of help with bathing/dressing/bathroom;Assistance with cooking/housework;Direct supervision/assist for medications management;Assist for transportation;Supervision due to cognitive status;Help with stairs or ramp for entrance   Can travel by private vehicle   No    Equipment  Recommendations Other (comment) (Per accepting facility)  Recommendations for Other Services       Functional Status Assessment Patient has had a recent decline in their functional status and demonstrates the ability to make significant improvements in function in a reasonable and predictable amount of time.     Precautions / Restrictions Precautions Precautions: Fall Restrictions Weight Bearing Restrictions: No      Mobility  Bed Mobility               General bed mobility comments: Pt declined due to eating lunch.    Transfers                        Ambulation/Gait                  Stairs            Wheelchair Mobility     Tilt Bed    Modified Rankin (Stroke Patients Only)       Balance Overall balance assessment: History of Falls                                           Pertinent Vitals/Pain Pain Assessment Pain Assessment: No/denies pain    Home Living Family/patient expects to be discharged to:: Skilled nursing facility                   Additional Comments: Pt has been placed in SNF since October 2023. Pt reports he intends to return home to live Independently. Per chart review, pt has limited assistance  available from friends. However, OT continues to recommend SNF at this time. (From previous admission last month. No changes this admission.)    Prior Function Prior Level of Function : Needs assist;Patient poor historian/Family not available             Mobility Comments: Pt reports prior to October 2023 he was able to independently transfer to [facility] and walk around house with RW. Pt reports he has been mostly bedbound and transferred via hoyer lift at SNF. ADLs Comments: Pt reports Ind however previous notes suggest otherwise.     Extremity/Trunk Assessment        Lower Extremity Assessment Lower Extremity Assessment: LLE deficits/detail;RLE deficits/detail RLE Deficits / Details: 5th Ray  amputation LLE Deficits / Details: L BKA       Communication   Communication Communication: No apparent difficulties  Cognition Arousal: Alert Behavior During Therapy: Flat affect Overall Cognitive Status: No family/caregiver present to determine baseline cognitive functioning                                 General Comments: Pt appears to be a poor historian as subjective history does not match up to recent admissions. Noted in previous admissions that patient was not a good historian.        General Comments General comments (skin integrity, edema, etc.): VSS    Exercises     Assessment/Plan    PT Assessment    PT Problem List         PT Treatment Interventions      PT Goals (Current goals can be found in the Care Plan section)       Frequency       Co-evaluation               AM-PAC PT "6 Clicks" Mobility  Outcome Measure                  End of Session     Patient left: in bed;with call bell/phone within reach Nurse Communication: Mobility status      Time: 1130-1142 PT Time Calculation (min) (ACUTE ONLY): 12 min   Charges:   PT Evaluation $PT Eval Moderate Complexity: 1 Mod   PT General Charges $$ ACUTE PT VISIT: 1 Visit         Shela Nevin, PT, DPT Acute Rehab Services [phone]   Gladys Damme 06/06/2023, 12:21 PM

## 2023-06-06 NOTE — TOC CAGE-AID Note (Signed)
Transition of Care [facility]) - CAGE-AID Screening   Patient Details  Name: Peter Yang MRN: [id number] Date of Birth: 26-Sep-1956  Transition of Care [facility]) CM/SW Contact:    Leota Sauers, RN Phone Number: 06/06/2023, 5:50 AM   Clinical Narrative:  Patient denies use of alcohol and illicit drugs. Resources not given at this time.  CAGE-AID Screening:    Have You Ever Felt You Ought to Cut Down on Your Drinking or Drug Use?: No Have People Annoyed You By Critizing Your Drinking Or Drug Use?: No Have You Felt Bad Or Guilty About Your Drinking Or Drug Use?: No Have You Ever Had a Drink or Used Drugs First Thing In The Morning to Steady Your Nerves or to Get Rid of a Hangover?: No CAGE-AID Score: 0  Substance Abuse Education Offered: No

## 2023-06-06 NOTE — Progress Notes (Signed)
     Subjective: Pt more alert and engaged than I have seen him. Drainage increasing in volume though  Objective: Vital signs in last 24 hours: Temp:  [97.8 F (36.6 C)-98.3 F (36.8 C)] 98.3 F (36.8 C) (08/16 0744) Pulse Rate:  [72-77] 75 (08/16 0744) Resp:  [16-19] 16 (08/16 0744) BP: (104-116)/(60-78) 109/71 (08/16 0744) SpO2:  [98 %-100 %] 98 % (08/16 0744) Weight:  [323 kg] 117 kg (08/16 0500)  Assessment/Plan: # Purulent drainage from right side incision Multiple requests have been made to have our office put in contact with his caretaker at his long-term care facility to ensure that he is seen in clinic for reassessment and suture removal.  This will be at least his second readmission since his initial surgery without follow-up.  Sutures have been left in place for significantly longer than normal due to severe protein calorie malnutrition/anasarca and delayed wound healing.  I removed all 10 of them.   There was initially a pinhole sinus draining at the bottom of the right side suture. Significant drainage from left side today. It is not large enough to probe or pack.  Would culture (+) Kleb,Pseud. Broadly covered. Will watch over the weekend and if output worsens, consider I&D. UC (+) Pseudomonas   #hydronephrosis Chronic in nature.  This has been noted on multiple CT images after adequate time provided for urinary decompression. Preserved renal function    Intake/Output from previous day: 08/15 0701 - 08/16 0700 In: 960 [P.O.:960] Out: 2451 [Urine:2450; Stool:1]  Intake/Output this shift: Total I/O In: 1200 [P.O.:1200] Out: -   Physical Exam:  General: Alert and oriented CV: No cyanosis Lungs: equal chest rise Abdomen: Soft, NTND, no rebound or guarding Skin: bilateral scrotal incisions draining purulent looking fluid L>R Gu: Foley in place, clear yellow urine  Lab Results: Recent Labs    06/04/23 0644 06/05/23 0121  HGB 11.3* 9.5*  HCT 38.1* 31.6*    BMET Recent Labs    06/05/23 0121 06/06/23 0311  NA 135 133*  K 3.4* 3.6  CL 104 103  CO2 21* 22  GLUCOSE 169* 199*  BUN 15 17  CREATININE 0.96 0.83  CALCIUM 8.3* 8.0*     Studies/Results: No results found.    LOS: 2 days   Elmon Kirschner, NP [facility] Pager: [phone]  06/06/2023, 2:33 PM

## 2023-06-06 NOTE — Consult Note (Signed)
Date of Admission:  06/04/2023          Reason for Consult: Purulent drainage from incision sites   Referring Provider: Hazeline Junker, MD   Assessment:  Wound infection at site of prior Fourniers' I and D MDR pseudomonas in urine but no urinary symptoms Fall from bed which was mechanical Pleural effusions  PAF Hydronephrosis DM   Plan:  Continue cefepime for now Followup on sensis from wound culture Urology following and may operate if he fails to improve Contact precautions  Dr Renold Don will check in on the patient this weekend.  Principal Problem:   Wound dehiscence in puerperium, perineal Active Problems:   PAF (paroxysmal atrial fibrillation) (HCC)   Hematuria   Pleural effusion, bilateral   Type 2 diabetes mellitus with diabetic peripheral angiopathy without gangrene, without long-term current use of insulin (HCC)   UTI (urinary tract infection) due to urinary indwelling catheter (HCC)   Fournier disease   Malnutrition of moderate degree   Scheduled Meds:  acetaminophen  1,000 mg Oral Q8H   apixaban  5 mg Oral BID   ascorbic acid  500 mg Oral Daily   atorvastatin  40 mg Oral QPM   Chlorhexidine Gluconate Cloth  6 each Topical Q0600   escitalopram  10 mg Oral Daily   feeding supplement  237 mL Oral BID BM   ferrous sulfate  325 mg Oral Q breakfast   finasteride  5 mg Oral Daily   furosemide  40 mg Intravenous BID   hydrocortisone  5 mg Oral BID   insulin aspart  0-6 Units Subcutaneous TID WC   insulin glargine-yfgn  20 Units Subcutaneous QHS   leptospermum manuka honey  1 Application Topical Daily   midodrine  10 mg Oral TID with meals   multivitamin with minerals  1 tablet Oral Daily   mupirocin ointment  1 Application Nasal BID   nutrition supplement (JUVEN)  1 packet Oral BID BM   nystatin   Topical BID   oxyCODONE  15 mg Oral Q12H   pantoprazole  40 mg Oral Daily   saccharomyces boulardii  250 mg Oral BID   senna-docusate  1 tablet Oral BID    tamsulosin  0.4 mg Oral Daily   vitamin A  10,000 Units Oral Daily   zinc sulfate  220 mg Oral Daily   Continuous Infusions:  ceFEPime (MAXIPIME) IV 2 g (06/06/23 1656)   vancomycin 1,250 mg (06/06/23 0410)   PRN Meds:.acetaminophen **OR** acetaminophen, albuterol, oxyCODONE  HPI: Peter Yang is a 67 y.o. male with past medical history significant for diabetic foot ulcer status post left below the knee amputation coronary artery disease hyperlipidemia paroxysmal atrial fibrillation adrenal insufficiency stroke BPH with chronic Foley catheter and recent Fournier's gangrene in June 2024.  In that hospitalization the patient went debridement x 2.  Imaging had shown abscess in the pelvis at the base of the penis that extended to the anterior surface of the pubic symphysis.  The patient was therefore treated with a regimen of antibiotics with ertapenem and daptomycin x 6 weeks.  He has now been admitted to hospital after he fell out of his bed at [facility] nursing home.  He tells me that the remote control for the call button as well as his phone had fallen from his bed and when he attempted to reach his room he fell from his bed which did not have guardrails.  He Druck his head and was  brought to the emergency room.  In the ER he was afebrile but had evidence of dehiscence of his left scrotal wound with some discharge.  Blood cultures were taken have not yielded any organism.  CT of the abdomen pelvis performed shows chronic left hydroureteronephrosis with some new or hyperattenuation the distal left ureter, large bilateral pleural effusions dilated rectum  Allergy have been consulted and seen the patient removed sutures and sent cultures.  Cultures from this wound have yielded Klebsiella pneumonia and Pseudomonas with sensitivities pending urine was also cultured that the patient does not have urinary symptoms the Pseudomonas from the urine is resistant to all of the conventional  antibiotics against it which it was tested.  On exam he continues to have quite edematous scrotal tissue with some tenderness along the incision sites.  Patient has been on Vanco mycin and Zosyn and then vancomycin and cefepime now cefepime alone.  I have personally spent 82 minutes involved in face-to-face and non-face-to-face activities for this patient on the day of the visit. Professional time spent includes the following activities: Preparing to see the patient (review of tests), Obtaining and/or reviewing separately obtained history (admission/discharge record), Performing a medically appropriate examination and/or evaluation , Ordering medications/tests/procedures, referring and communicating with other health care professionals, Documenting clinical information in the EMR, Independently interpreting results (not separately reported), Communicating results to the patient/family/caregiver, Counseling and educating the patient/family/caregiver and Care coordination (not separately reported).     Review of Systems: Review of Systems  Constitutional:  Negative for chills, fever, malaise/fatigue and weight loss.  HENT:  Negative for congestion and sore throat.   Eyes:  Negative for blurred vision and photophobia.  Respiratory:  Negative for cough, shortness of breath and wheezing.   Cardiovascular:  Negative for chest pain, palpitations and leg swelling.  Gastrointestinal:  Negative for abdominal pain, blood in stool, constipation, diarrhea, heartburn, melena, nausea and vomiting.  Genitourinary:  Negative for dysuria, flank pain and hematuria.  Musculoskeletal:  Negative for back pain, falls, joint pain and myalgias.  Skin:  Negative for itching and rash.  Neurological:  Negative for dizziness, focal weakness, loss of consciousness, weakness and headaches.  Endo/Heme/Allergies:  Does not bruise/bleed easily.  Psychiatric/Behavioral:  Negative for depression and suicidal ideas. The patient  does not have insomnia.     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 Overview:  Added automatically from request for surgery [id number] Formatting of this note might be different from the original. Added automatically from request for surgery [id number]   Large bowel perforation (HCC) 09/27/2015   Malignant neoplasm of prostate (HCC) 10/22/2019   Myopia with astigmatism and presbyopia, bilateral 06/15/2018   Last Assessment & Plan:  Formatting of this note might be different from the original. - Wrx printed Formatting of this note  might be different from the original. 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 - Stable, monitor Formatting of this note might be different from the original. Last Assessment & Plan:  - Stable, monitor   Retroperitoneal abscess (HCC) 10/02/2015   Status post corneal transplant 10/07/2016   Formatting of this note might be  different from the original. Overview:  Added automatically from request for surgery [id number] Formatting of this note might be different from the original. Added automatically from request for surgery [id number]  Last Assessment & Plan:  Formatting of this note might be different from the original. - OD, 2/2 KCN - Stable, no NVK / rejection - Continue off steroids - Mo   Thoracic ascending aortic aneurysm (HCC)    TIA (transient ischemic attack) 2012   Type 2 diabetes mellitus (HCC)     Social History   Tobacco Use   Smoking status: Never   Smokeless tobacco: Never  Substance Use Topics   Alcohol use: Never   Drug use: Never    Family History  Problem Relation Age of Onset   Diabetes Mother    Hypertension Mother    Heart disease Mother    Cancer Father    Heart attack Sister    Cancer Sister    Diabetes Brother    Allergies  Allergen Reactions   Sglt2 Inhibitors Other (See Comments)    Fournier's gangrene    OBJECTIVE: Blood pressure 109/71, pulse 75, temperature 98.3 F (36.8 C), temperature source Oral, resp. rate 16, height 6\' 2"  (1.88 m), weight 117 kg, SpO2 98%.  Physical Exam Exam conducted with a chaperone present.  Constitutional:      Appearance: He is well-developed.  HENT:     Head: Normocephalic and atraumatic.  Eyes:     Conjunctiva/sclera: Conjunctivae normal.  Cardiovascular:     Rate and Rhythm: Normal rate and regular rhythm.  Pulmonary:     Effort: Pulmonary effort is normal. No respiratory distress.     Breath sounds: No wheezing.  Abdominal:     General: There is no distension.     Palpations: Abdomen is soft.  Genitourinary:    Penis: Erythema present.      Comments: Edematous scrotum Musculoskeletal:        General: No tenderness. Normal range of motion.     Cervical back: Normal range of motion and neck supple.  Skin:    General: Skin is warm and dry.     Coloration: Skin is not pale.     Findings: No erythema or rash.  Neurological:      General: No focal deficit present.     Mental Status: He is alert and oriented to person, place, and time.  Psychiatric:        Mood and Affect: Mood normal.        Behavior: Behavior normal.        Thought Content: Thought content normal.        Judgment: Judgment normal.     Lab Results Lab Results  Component Value Date   WBC 6.8 06/05/2023   HGB 9.5 (L) 06/05/2023   HCT 31.6 (L)  06/05/2023   MCV 86.3 06/05/2023   PLT 155 06/05/2023    Lab Results  Component Value Date   CREATININE 0.83 06/06/2023   BUN 17 06/06/2023   NA 133 (L) 06/06/2023   K 3.6 06/06/2023   CL 103 06/06/2023   CO2 22 06/06/2023    Lab Results  Component Value Date   ALT 11 06/05/2023   AST 16 06/05/2023   ALKPHOS 68 06/05/2023   BILITOT 1.0 06/05/2023     Microbiology: Recent Results (from the past 240 hour(s))  Blood Culture (routine x 2)     Status: None (Preliminary result)   Collection Time: 06/04/23  6:46 AM   Specimen: BLOOD  Result Value Ref Range Status   Specimen Description BLOOD RIGHT ANTECUBITAL  Final   Special Requests   Final    BOTTLES DRAWN AEROBIC AND ANAEROBIC Blood Culture adequate volume   Culture   Final    NO GROWTH 2 DAYS Performed at [facility], [address], [place], [place] [address]    Report Status PENDING  Incomplete  Blood Culture (routine x 2)     Status: None (Preliminary result)   Collection Time: 06/04/23  6:46 AM   Specimen: BLOOD RIGHT ARM  Result Value Ref Range Status   Specimen Description BLOOD RIGHT ARM  Final   Special Requests   Final    BOTTLES DRAWN AEROBIC AND ANAEROBIC Blood Culture adequate volume   Culture   Final    NO GROWTH 2 DAYS Performed at [facility], [address], [place], [place] [address]    Report Status PENDING  Incomplete  Urine Culture     Status: Abnormal   Collection Time: 06/04/23  6:52 AM   Specimen: Urine, Random  Result Value Ref Range Status   Specimen Description URINE, RANDOM  Final    Special Requests   Final    NONE Reflexed from [id number] Performed at [facility], [address] [place], [place] [address]    Culture >=100,000 COLONIES/mL PSEUDOMONAS AERUGINOSA (A)  Final   Report Status 06/06/2023 FINAL  Final   Organism ID, Bacteria PSEUDOMONAS AERUGINOSA (A)  Final      Susceptibility   Pseudomonas aeruginosa - MIC*    CEFTAZIDIME 16 INTERMEDIATE Intermediate     CIPROFLOXACIN 1 INTERMEDIATE Intermediate     GENTAMICIN 2 SENSITIVE Sensitive     IMIPENEM >=16 RESISTANT Resistant     * >=100,000 COLONIES/mL PSEUDOMONAS AERUGINOSA  Aerobic/Anaerobic Culture w Gram Stain (surgical/deep wound)     Status: None (Preliminary result)   Collection Time: 06/04/23 12:30 PM   Specimen: Wound  Result Value Ref Range Status   Specimen Description WOUND  Final   Special Requests SCROTUM  Final   Gram Stain   Final    FEW WBC PRESENT, PREDOMINANTLY PMN FEW GRAM NEGATIVE RODS    Culture   Final    RARE KLEBSIELLA PNEUMONIAE RARE PSEUDOMONAS AERUGINOSA SUSCEPTIBILITIES TO FOLLOW Performed at [facility], [address] [place], [place] [address]    Report Status PENDING  Incomplete  MRSA Next Gen by PCR, Nasal     Status: Abnormal   Collection Time: 06/05/23  5:23 PM   Specimen: Nasal Mucosa; Nasal Swab  Result Value Ref Range Status   MRSA by PCR Next Gen DETECTED (A) NOT DETECTED Final    Comment: RESULT CALLED TO, READ BACK BY AND VERIFIED WITH: RN SHIRLENE BALDEZ ON 06/05/23 @ 2051 BY  DRT (NOTE) The GeneXpert MRSA Assay (FDA approved for NASAL specimens only), is one component of a comprehensive MRSA colonization surveillance program. It is not intended to diagnose MRSA infection nor to guide or monitor treatment for MRSA infections. Test performance is not FDA approved in patients less than 60 years old. Performed at [facility], [address] [place], [place] [address]     Acey Lav, MD [facility] for Infectious  Disease [facility] [phone] pager  06/06/2023, 5:24 PM

## 2023-06-06 NOTE — Progress Notes (Addendum)
Physical Therapy Treatment Patient Details Name: Peter Yang MRN: [id number] DOB: 16-May-1956 Today's Date: 06/06/2023   History of Present Illness Pt is a 67 year old male admitted on 06/04/23 s/p fall with head strike after attempting to get remote that had fallen on floor. On evaluation in ED patient appeared well but was noted to have surgical wound dehiscence and poor healing of perineal with concern for infection. 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 Patient course further complicated by  recent diagnosis of Fournier's gangrene s/p debridement x 2 during hospitalization 6/12-6/19/2024 followed by course of iv daptomycin and ertapenem with end dated of 05/20/23.    PT Comments  Pt received for second session to assess mobility after pt refused mobility during first session. Pt refused to sit EOB however was agreeable to rolling and bed level therex. Pt states that he is at his baseline and would prefer to wait until returning to rehab for PT. PT will sign off at this time. Re consult PT if mobility status changes.     If plan is discharge home, recommend the following: Two people to help with walking and/or transfers;A lot of help with bathing/dressing/bathroom;Assistance with cooking/housework;Direct supervision/assist for medications management;Assist for transportation;Supervision due to cognitive status;Help with stairs or ramp for entrance   Can travel by private vehicle     No  Equipment Recommendations  Other (comment) (Per accepting facility)    Recommendations for Other Services       Precautions / Restrictions Precautions Precautions: Fall Restrictions Weight  Bearing Restrictions: No     Mobility  Bed Mobility Overal bed mobility: Needs Assistance Bed Mobility: Rolling Rolling: Mod assist         General bed mobility comments: Pt reports rolling to L is easier due to torn rotator cuff on L shouder. Declines further mobility due to pain.    Transfers                   General transfer comment: Declined due to pain    Ambulation/Gait               General Gait Details: Pt unable   Stairs             Wheelchair Mobility     Tilt Bed    Modified Rankin (Stroke Patients Only)       Balance Overall balance assessment: History of Falls                                          Cognition Arousal: Alert Behavior During Therapy: Flat affect, Anxious Overall Cognitive Status: No family/caregiver present to determine baseline cognitive functioning                                 General Comments: Pt appears to be a poor historian as subjective history does not match up to recent admissions. Noted in previous admissions that patient was not a good historian.        Exercises General Exercises - Upper Extremity Elbow Flexion: AROM,  Both, 15 reps, Supine Other Exercises Other Exercises: Punching pillow x 30 with RUE.    General Comments General comments (skin integrity, edema, etc.): VSS      Pertinent Vitals/Pain Pain Assessment Pain Assessment: Faces Faces Pain Scale: Hurts little more Pain Location: Groin Pain Descriptors / Indicators: Grimacing, Discomfort Pain Intervention(s): Monitored during session, Limited activity within patient's tolerance, Premedicated before session, Repositioned    Home Living                          Prior Function            PT Goals (current goals can now be found in the care plan section) Progress towards PT goals: Goals met/education completed, patient discharged from PT    Frequency           PT Plan       Co-evaluation              AM-PAC PT "6 Clicks" Mobility   Outcome Measure  Help needed turning from your back to your side while in a flat bed without using bedrails?: A Lot Help needed moving from lying on your back to sitting on the side of a flat bed without using bedrails?: A Lot Help needed moving to and from a bed to a chair (including a wheelchair)?: Total Help needed standing up from a chair using your arms (e.g., wheelchair or bedside chair)?: Total Help needed to walk in hospital room?: Total Help needed climbing 3-5 steps with a railing? : Total 6 Click Score: 8    End of Session   Activity Tolerance: Patient tolerated treatment well Patient left: in bed;with call bell/phone within reach Nurse Communication: Mobility status PT Visit Diagnosis: Other abnormalities of gait and mobility (R26.89)     Time: 1191-4782 PT Time Calculation (min) (ACUTE ONLY): 8 min  Charges:    $Therapeutic Exercise: 8-22 mins PT General Charges $$ ACUTE PT VISIT: 1 Visit                     Peter Yang, PT, DPT Acute Rehab Services [phone]    Peter Yang 06/06/2023, 3:30 PM

## 2023-06-06 NOTE — Progress Notes (Signed)
[facility] PROGRESS NOTE  Charlotte Domanski (DOB: 1956-01-07) VWU:[id number] PCP: Patient, No Pcp Per Outpatient Specialists: [facility], Dr. Drue Second  Brief Narrative: Makell Keizer is a 67 y.o. male with a history of T2DM with polyneuropathy, DFU s/p left BKA, CAD s/p stent, HLD, PAF, HFrEF, adrenal insufficiency, CVA, BPH with chronic foley, and recent surgeries for Fournier's gangrene June 2024 having completed IV antibiotics 7/30 who presented to the ED on 06/04/2023 after falling out of bed at [facility]. He was afebrile with dehiscence of left scrotal wound with discharge. Work up showed WBC 9.3k, ESR 4, PCT <0.10, CRP 2.9 (down from 20.7 on 8/7), lactic acid 2.1 > 1.8. IV antibiotics were given and urology consulted.  Subjective: No changes, slept ok but got interrupted a lot. Pain seems to be controlled. No dyspnea. No fevers.   Objective: BP 109/71 (BP Location: Right Arm)   Pulse 75   Temp 98.3 F (36.8 C) (Oral)   Resp 16   Ht 6\' 2"  (1.88 m)   Wt 117 kg   SpO2 98%   BMI 33.12 kg/m   Gen: No distress HEENT: Constricted but reactive pupils Pulm: Clear, diminished, nonlabored  CV: Irreg irreg, rate in 70's, no MRG. Diffuse edema slightly improved from yesterday. GI: Soft, NT, ND, +BS Neuro: Alert and oriented. Ext: Warm, L BKA Skin: Scrotum edema, induration, and erythema unchanged, left wound with slough, no bleeding or fluctuance  Assessment & Plan: Anasarca, acute on chronic combined HFrEF, bilateral pleural effusions: Diffuse body edema also attributable to hypoalbuminemia/malnutrition.  - Respiratory status is quite stable. Will monitor effusions while diuresing.  - Continue lasix 40mg  IV BID, monitor strict I/O (2.9L yesterday, Cr stable, monitor daily weights (inaccurate thus far). If BP does not tolerate, consider albumin coadministration. - RD consult, supplement protein  Scrotal surgical wound infection s/p debridement x2 for Fournier's gangrene:  - Continue MRSA coverage  with vancomycin and cefepime. I will ask for formal ID consultation as wound culture growing Klebsiella and Pseudomonas, has history of completing daptomycin and ertapenem as of a couple weeks ago. Blood cultures NGTD. Inflammatory markers overall are reassuring.  - Wound care deferred to urology, has removed sutures.   Vitamin A deficiency:  - Supplement - Also empirically supplement zinc and vitamin C to aid wound healing.  CAUTI:  - Urology to exchange foley, last placed 7/22 with great difficulty. We're diuresing hoping to improve things - Urine culture with Pseudomonas, started cefepime pending susceptibilities. Was given zosyn. Culture with multidrug resistance. Pseudomonas also now in wound culture, consulting ID as above.   Hematuria: Chronic but currently resolved.  - Ok to continue eliquis  Right foot wound: No exudate or odor at this time.  - Continue regular podiatry follow up, optimize nutritional status and glycemic control.  - Keep covered, continue daily wound care per WOC.  - Offload with prevalon boot  Stage 2 sacral pressure injury: POA:  - Offload, foam dressing w/medihoney.   IDT2DM: Last HbA1c 7% shows adequate glycemic control to assist wound healing.  - Continue glargine and SSI. At inpatient goal.  PAF: In atrial fibrillation with CVR.   - Continue eliquis, not on rate-control agent at baseline.  Tinea cruris:  - Continue nystatin powder.   Chronic pain:  - No changes  Hypokalemia:  - Supplement a bit today and monitor.   Hypomagnesemia:  - Supplement  Chronic blood loss anemia: Above previous baseline, even after hemodilution. No active bleeding noted.  - Monitor intermittently.  -  Continue iron supplement with bowel regimen  Adrenal insufficiency:  - Continue home dose cortef, continue baseline dose midodrine. Low threshold for stress dose  Hydronephrosis, BPH:  - Renal function stable, defer management to urology. - Continue tamsulosin,  finasteride, and foley  Fall out of bed: Negative CT head, cervical spine. - Fall precautions  HLD:  - Continue statin  Depression:  - Continue escitalopram  [name], MD [facility] [email or web address] 06/06/2023, 1:07 PM

## 2023-06-07 DIAGNOSIS — Z8619 Personal history of other infectious and parasitic diseases: Secondary | ICD-10-CM | POA: Diagnosis not present

## 2023-06-07 DIAGNOSIS — L089 Local infection of the skin and subcutaneous tissue, unspecified: Secondary | ICD-10-CM | POA: Diagnosis not present

## 2023-06-07 LAB — CBC
HCT: 32.7 % — ABNORMAL LOW (ref 39.0–52.0)
Hemoglobin: 10.2 g/dL — ABNORMAL LOW (ref 13.0–17.0)
MCH: 27.3 pg (ref 26.0–34.0)
MCHC: 31.2 g/dL (ref 30.0–36.0)
MCV: 87.4 fL (ref 80.0–100.0)
Platelets: 144 10*3/uL — ABNORMAL LOW (ref 150–400)
RBC: 3.74 MIL/uL — ABNORMAL LOW (ref 4.22–5.81)
RDW: 24.5 % — ABNORMAL HIGH (ref 11.5–15.5)
WBC: 6.8 10*3/uL (ref 4.0–10.5)
nRBC: 0 % (ref 0.0–0.2)

## 2023-06-07 LAB — GLUCOSE, CAPILLARY
Glucose-Capillary: 154 mg/dL — ABNORMAL HIGH (ref 70–99)
Glucose-Capillary: 160 mg/dL — ABNORMAL HIGH (ref 70–99)
Glucose-Capillary: 153 mg/dL — ABNORMAL HIGH (ref 70–99)
Glucose-Capillary: 179 mg/dL — ABNORMAL HIGH (ref 70–99)

## 2023-06-07 LAB — BASIC METABOLIC PANEL
Anion gap: 12 (ref 5–15)
BUN: 20 mg/dL (ref 8–23)
CO2: 21 mmol/L — ABNORMAL LOW (ref 22–32)
Calcium: 8 mg/dL — ABNORMAL LOW (ref 8.9–10.3)
Chloride: 98 mmol/L (ref 98–111)
Creatinine, Ser: 0.8 mg/dL (ref 0.61–1.24)
GFR, Estimated: >60 mL/min (ref >60)
Glucose, Bld: 188 mg/dL — ABNORMAL HIGH (ref 70–99)
Potassium: 4 mmol/L (ref 3.5–5.1)
Sodium: 131 mmol/L — ABNORMAL LOW (ref 135–145)

## 2023-06-07 LAB — MAGNESIUM: Magnesium: 1.5 mg/dL — ABNORMAL LOW (ref 1.7–2.4)

## 2023-06-07 MED ORDER — MAGNESIUM SULFATE 2 GM/50ML IV SOLN
2.0000 g | Freq: Once | INTRAVENOUS | Status: AC
  Administered 2023-06-07: 2 g via INTRAVENOUS
  Filled 2023-06-07: qty 50

## 2023-06-07 MED ORDER — GABAPENTIN 100 MG PO CAPS
200.0000 mg | ORAL_CAPSULE | Freq: Two times a day (BID) | ORAL | Status: AC
  Administered 2023-06-07 – 2023-06-10 (×6): 200 mg via ORAL
  Filled 2023-06-07 (×6): qty 2

## 2023-06-07 MED ORDER — GABAPENTIN 100 MG PO CAPS
200.0000 mg | ORAL_CAPSULE | Freq: Two times a day (BID) | ORAL | Status: DC
  Administered 2023-06-07: 200 mg via ORAL
  Filled 2023-06-07: qty 2

## 2023-06-07 MED ORDER — SODIUM CHLORIDE 0.9 % IV SOLN
1.5000 g | Freq: Three times a day (TID) | INTRAVENOUS | Status: DC
  Administered 2023-06-07 – 2023-06-09 (×6): 1.5 g via INTRAVENOUS
  Filled 2023-06-07 (×8): qty 11.4

## 2023-06-07 NOTE — Progress Notes (Signed)
     Subjective: Pt minimally arousable today.     Objective: Vital signs in last 24 hours: Temp:  [97.3 F (36.3 C)-98.3 F (36.8 C)] 97.3 F (36.3 C) (08/17 0430) Pulse Rate:  [73-82] 82 (08/17 0430) Resp:  [13-18] 18 (08/17 0430) BP: (109-120)/(71-88) 111/85 (08/17 0430) SpO2:  [97 %-98 %] 97 % (08/16 1940)    Assessment/Plan: # Purulent drainage from right side incision Multiple requests have been made to have our office put in contact with his caretaker at his long-term care facility to ensure that he is seen in clinic for reassessment and suture removal.  This will be at least his second readmission since his initial surgery without follow-up.  Sutures have been left in place for significantly longer than normal due to severe protein calorie malnutrition/anasarca and delayed wound healing.  I removed all 10 of them.   There was initially a pinhole sinus draining at the bottom of the right side suture. Significant drainage from left side today. It is not large enough to probe or pack.  Would culture (+) Kleb,Pseud. Broadly covered. Will watch over the weekend and if output worsens, consider I&D. UC (+) Pseudomonas   #hydronephrosis Chronic in nature.  This has been noted on multiple CT images after adequate time provided for urinary decompression. Preserved renal function    Intake/Output from previous day: 08/16 0701 - 08/17 0700 In: 2691.3 [P.O.:1440; IV Piggyback:1251.3] Out: 1600 [Urine:1600]  Intake/Output this shift: No intake/output data recorded.  Physical Exam:  General: Difficult to arouse.  CV: No cyanosis Lungs: equal chest rise Abdomen: Soft, NTND, no rebound or guarding Skin: bilateral scrotal incisions draining purulent looking fluid L>R Gu: Foley in place, clear yellow urine  Lab Results: Recent Labs    06/05/23 0121 06/07/23 0400  HGB 9.5* 10.2*  HCT 31.6* 32.7*   BMET Recent Labs    06/06/23 0311 06/07/23 0400  NA 133* 131*  K 3.6 4.0   CL 103 98  CO2 22 21*  GLUCOSE 199* 188*  BUN 17 20  CREATININE 0.83 0.80  CALCIUM 8.0* 8.0*     Studies/Results: No results found.    LOS: 3 days   Elmon Kirschner, NP [facility] Pager: [phone]  06/07/2023, 7:20 AM Patient ID: Peter Yang, male   DOB: May 22, 1956, 66 y.o.   MRN: [id number]

## 2023-06-07 NOTE — Plan of Care (Signed)
  Problem: Nutrition: Goal: Adequate nutrition will be maintained Outcome: Progressing   Problem: Pain Managment: Goal: General experience of comfort will improve Outcome: Progressing   Problem: Safety: Goal: Ability to remain free from injury will improve Outcome: Progressing   

## 2023-06-07 NOTE — Progress Notes (Signed)
[facility] PROGRESS NOTE  Peter Yang (DOB: September 13, 1956) ZOX:[id number] PCP: Patient, No Pcp Per Outpatient Specialists: [facility], Dr. Drue Second  Brief Narrative: Peter Yang is a 67 y.o. male with a history of T2DM with polyneuropathy, DFU s/p left BKA, CAD s/p stent, HLD, PAF, HFrEF, adrenal insufficiency, CVA, BPH with chronic foley, and recent surgeries for Fournier's gangrene June 2024 having completed IV antibiotics 7/30 who presented to the ED on 06/04/2023 after falling out of bed at [facility]. He was afebrile with dehiscence of left scrotal wound with discharge. Work up showed WBC 9.3k, ESR 4, PCT <0.10, CRP 2.9 (down from 20.7 on 8/7), lactic acid 2.1 > 1.8. IV antibiotics were given and urology consulted.  Subjective: Slept ok, the pain in the scrotum is stable. He also has stable more chronic pain in the right knee due to arthritis and right foot at the site of amputation, not worse than previously. He's alert, ate breakfast.   Objective: BP 104/82   Pulse 72   Temp 97.9 F (36.6 C) (Oral)   Resp 18   Ht 6\' 2"  (1.88 m)   Wt 117 kg   SpO2 98%   BMI 33.12 kg/m   Gen: No distress Pulm: Clear, nonlabored  CV: RRR, II/VI SEM, no gallop. No JVD. +diffuse edema, overall improving.  GI: Soft, NT, ND, +BS Neuro: Alert and oriented, interactive, normal mentation. No new focal deficits. Ext: Warm, dry. Left BKA stable, bony hypertrophy that is painless to palpation of both knees, no effusion/erythema there. Right foot lateral 5th MT amputation site wound has granulation tissue, anteriorly there is slough, no exudate, odor, spreading erythema.  Skin: Scrotum has increased purulent discharge from left wound.    Assessment & Plan: Anasarca, acute on chronic combined HFrEF, bilateral pleural effusions: Diffuse body edema also attributable to hypoalbuminemia/malnutrition.  - Respiratory status remains quite stable. Will monitor effusions while diuresing.  - Continue lasix 40mg  IV BID, still  maintaining negative balance with 2.6L UOP yesterday. Cr stable. BP ok. Continue to monitor daily weights (inaccurate thus far). If BP does not tolerate, consider albumin coadministration. - RD consulted, supplementing protein  Scrotal surgical wound infection s/p debridement x2 for Fournier's gangrene:  - Continue MRSA coverage with vancomycin and Pseudomonal coverage empirically with cefepime. ID consulted due to multidrug resistance on urine culture. We've continued cefepime with wound culture growing Klebsiella and Pseudomonas, susceptibilities are still pending. Will need to follow these closely and adjust as needed. Blood cultures NGTD. Inflammatory markers overall are reassuring.  - Wound care deferred to urology, has removed sutures. Has increased discharge, I suspect he will need intervention, but following over the weekend.  Vitamin A deficiency:  - Supplement - Also empirically supplement zinc and vitamin C to aid wound healing.  CAUTI vs. asymptomatic bacteriuria: - Urology to exchange foley, last placed 7/22 with great difficulty. We're diuresing hoping to improve things - Urine culture with Pseudomonas, started cefepime pending susceptibilities. Was given zosyn. Culture with multidrug resistance but without urinary symptoms, no directed treatment for this per ID.  Hematuria: Chronic but currently resolved.  - Ok to continue eliquis anticoagulation.   Right foot wound: No exudate or odor at this time.  - Continue regular podiatry follow up, optimize nutritional status and glycemic control.  - Keep covered, continue daily wound care per WOC.  - Offload with prevalon boot  Stage 2 sacral pressure injury: POA:  - Offload, foam dressing w/medihoney.   IDT2DM: Last HbA1c 7% shows adequate glycemic control to  assist wound healing.  - Continue glargine and SSI. At inpatient goal.  PAF: In atrial fibrillation with CVR.   - Continue eliquis (could switch to heparin if needed, defer  to urology), not on rate-control agent at baseline.  Tinea cruris:  - Continue nystatin powder.   Chronic pain:  - No changes  Hypokalemia:  - Supplemented, resolved. Monitoring daily with diuresis  Hypomagnesemia:  - Supplement again today and recheck in AM.  Chronic blood loss anemia: Above previous baseline, even after hemodilution. No active bleeding noted.  - Monitor intermittently.  - Continue iron supplement with bowel regimen  Adrenal insufficiency:  - Continue home dose cortef, continue baseline dose midodrine. Low threshold for stress dose  Hydronephrosis, BPH:  - Renal function stable, defer management to urology. - Continue tamsulosin, finasteride, and foley  Fall out of bed: Negative CT head, cervical spine. - Fall precautions  HLD:  - Continue statin  Depression:  - Continue escitalopram  Peter Nine, MD [facility] [email or web address] 06/07/2023, 10:42 AM

## 2023-06-07 NOTE — Progress Notes (Signed)
[facility]  Date of Admission:  06/04/2023       Abx: 8/17-c zerbaxa 8/14-c vanc  8/15-17 cefepime 8/14 piptazo  ASSESSMENT: Recurrent scrotal ssi; hx fournier gangrene s/p I&D (6/12 and 04/09/2023; s/p 6 weeks ertapenem/dapto by 05/20/23) Assymptomatic bacteriuria Chronic foley catheter Dm2 pAF Malnutrition/deconditionig   Reviewed imaging Reviewed urology recommendation -- sutures removed 8/17 and would like to see how he respond on empiric abx prior to decision on I&D   Patient has mdr pseudomonas in wound and I would lean on the side of more aggressive source control otherwise it becomes resistant real quick and we don't have good options for this organism   PLAN: Switch cefepime to zerbaxa Asked micro to send for zerbaxa, avycaz, and cefideracol sensitivity Discussed with urology and primary team   I spent more than 50 minute reviewing data/chart, and coordinating care, providing direct face to face time providing counseling/discussing diagnostics/treatment plan with patient and treatment team   Principal Problem:   Wound dehiscence in puerperium, perineal Active Problems:   PAF (paroxysmal atrial fibrillation) (HCC)   Hematuria   Pleural effusion, bilateral   Type 2 diabetes mellitus with diabetic peripheral angiopathy without gangrene, without long-term current use of insulin (HCC)   UTI (urinary tract infection) due to urinary indwelling catheter (HCC)   Fournier disease   Malnutrition of moderate degree   Allergies  Allergen Reactions   Sglt2 Inhibitors Other (See Comments)    Fournier's gangrene    Scheduled Meds:  acetaminophen  1,000 mg Oral Q8H   apixaban  5 mg Oral BID   ascorbic acid  500 mg Oral Daily   atorvastatin  40 mg Oral QPM   Chlorhexidine Gluconate Cloth  6 each Topical Q0600   escitalopram  10 mg Oral Daily   feeding supplement  237 mL Oral BID BM   ferrous sulfate  325 mg Oral Q breakfast    finasteride  5 mg Oral Daily   furosemide  40 mg Intravenous BID   gabapentin  200 mg Oral BID   hydrocortisone  5 mg Oral BID   insulin aspart  0-6 Units Subcutaneous TID WC   insulin glargine-yfgn  20 Units Subcutaneous QHS   leptospermum manuka honey  1 Application Topical Daily   midodrine  10 mg Oral TID with meals   multivitamin with minerals  1 tablet Oral Daily   mupirocin ointment  1 Application Nasal BID   nutrition supplement (JUVEN)  1 packet Oral BID BM   nystatin   Topical BID   oxyCODONE  15 mg Oral Q12H   pantoprazole  40 mg Oral Daily   saccharomyces boulardii  250 mg Oral BID   senna-docusate  1 tablet Oral BID   tamsulosin  0.4 mg Oral Daily   vitamin A  10,000 Units Oral Daily   zinc sulfate  220 mg Oral Daily   Continuous Infusions:  ceftolozane-tazobactam (ZERBAXA) 1.5 g in sodium chloride 0.9 % 100 mL IVPB     vancomycin 1,250 mg (06/07/23 0348)   PRN Meds:.acetaminophen **OR** acetaminophen, albuterol, oxyCODONE   SUBJECTIVE: Pain in scrotal area, otherwise "would be ok" No fever, chill  Review of Systems: ROS All other ROS was negative, except mentioned above     OBJECTIVE: Vitals:   06/07/23 0430 06/07/23 0909 06/07/23 0913 06/07/23 1015  BP: 111/85 104/73  104/82  Pulse: 82 80 70 72  Resp: 18 18  Temp: (!) 97.3 F (36.3 C) 97.9 F (36.6 C)    TempSrc: Oral Oral    SpO2:  98%    Weight:      Height:       Body mass index is 33.12 kg/m.  Physical Exam General/constitutional: no distress, pleasant, anasarca HEENT: Normocephalic, PER, Conj Clear, EOMI, Oropharynx clear Neck supple CV: rrr no mrg Lungs: clear to auscultation, normal respiratory effort Abd: Soft, Nontender Ext: no edema GU: foley catheter in place; all sutures removed from scrotum and along the wound are copious greenish purulent discharge; scrotum is swollen left > right   Lab Results Lab Results  Component Value Date   WBC 6.8 06/07/2023   HGB 10.2 (L)  06/07/2023   HCT 32.7 (L) 06/07/2023   MCV 87.4 06/07/2023   PLT 144 (L) 06/07/2023    Lab Results  Component Value Date   CREATININE 0.80 06/07/2023   BUN 20 06/07/2023   NA 131 (L) 06/07/2023   K 4.0 06/07/2023   CL 98 06/07/2023   CO2 21 (L) 06/07/2023    Lab Results  Component Value Date   ALT 11 06/05/2023   AST 16 06/05/2023   ALKPHOS 68 06/05/2023   BILITOT 1.0 06/05/2023      Microbiology: Recent Results (from the past 240 hour(s))  Blood Culture (routine x 2)     Status: None (Preliminary result)   Collection Time: 06/04/23  6:46 AM   Specimen: BLOOD  Result Value Ref Range Status   Specimen Description BLOOD RIGHT ANTECUBITAL  Final   Special Requests   Final    BOTTLES DRAWN AEROBIC AND ANAEROBIC Blood Culture adequate volume   Culture   Final    NO GROWTH 3 DAYS Performed at [facility], [address], [place], [place] [address]    Report Status PENDING  Incomplete  Blood Culture (routine x 2)     Status: None (Preliminary result)   Collection Time: 06/04/23  6:46 AM   Specimen: BLOOD RIGHT ARM  Result Value Ref Range Status   Specimen Description BLOOD RIGHT ARM  Final   Special Requests   Final    BOTTLES DRAWN AEROBIC AND ANAEROBIC Blood Culture adequate volume   Culture   Final    NO GROWTH 3 DAYS Performed at [facility], [address], [place], [place] [address]    Report Status PENDING  Incomplete  Urine Culture     Status: Abnormal   Collection Time: 06/04/23  6:52 AM   Specimen: Urine, Random  Result Value Ref Range Status   Specimen Description URINE, RANDOM  Final   Special Requests   Final    NONE Reflexed from [id number] Performed at [facility], [address] [place], [place] [address]    Culture >=100,000 COLONIES/mL PSEUDOMONAS AERUGINOSA (A)  Final   Report Status 06/06/2023 FINAL  Final   Organism ID, Bacteria PSEUDOMONAS AERUGINOSA (A)  Final      Susceptibility   Pseudomonas aeruginosa - MIC*     CEFTAZIDIME 16 INTERMEDIATE Intermediate     CIPROFLOXACIN 1 INTERMEDIATE Intermediate     GENTAMICIN 2 SENSITIVE Sensitive     IMIPENEM >=16 RESISTANT Resistant     * >=100,000 COLONIES/mL PSEUDOMONAS AERUGINOSA  Aerobic/Anaerobic Culture w Gram Stain (surgical/deep wound)     Status: None (Preliminary result)   Collection Time: 06/04/23 12:30 PM   Specimen: Wound  Result Value Ref Range Status   Specimen Description WOUND  Final   Special Requests SCROTUM  Final   Gram Stain   Final    FEW WBC PRESENT, PREDOMINANTLY PMN FEW GRAM NEGATIVE RODS    Culture   Final    RARE KLEBSIELLA PNEUMONIAE RARE PSEUDOMONAS AERUGINOSA MULTI-DRUG RESISTANT ORGANISM Confirmed Extended Spectrum Beta-Lactamase Producer (ESBL).  In bloodstream infections from ESBL organisms, carbapenems are preferred over piperacillin/tazobactam. They are shown to have a lower risk of mortality. CRITICAL RESULT CALLED TO, READ BACK BY AND VERIFIED WITH: RN ISRAA MEDANI [phone] [id number] BY Berline Chough, [place] Performed at [facility], [address], [place], [place] [address]    Report Status PENDING  Incomplete   Organism ID, Bacteria KLEBSIELLA PNEUMONIAE  Final   Organism ID, Bacteria PSEUDOMONAS AERUGINOSA  Final      Susceptibility   Klebsiella pneumoniae - MIC*    AMPICILLIN >=32 RESISTANT Resistant     CEFEPIME >=32 RESISTANT Resistant     CEFTAZIDIME >=64 RESISTANT Resistant     CEFTRIAXONE >=64 RESISTANT Resistant     CIPROFLOXACIN <=0.25 SENSITIVE Sensitive     GENTAMICIN <=1 SENSITIVE Sensitive     IMIPENEM <=0.25 SENSITIVE Sensitive     TRIMETH/SULFA <=20 SENSITIVE Sensitive     AMPICILLIN/SULBACTAM >=32 RESISTANT Resistant     PIP/TAZO 16 SENSITIVE Sensitive     * RARE KLEBSIELLA PNEUMONIAE   Pseudomonas aeruginosa - MIC*    CEFTAZIDIME 16 INTERMEDIATE Intermediate     CIPROFLOXACIN 1 INTERMEDIATE Intermediate     GENTAMICIN 2 SENSITIVE Sensitive     IMIPENEM >=16 RESISTANT Resistant     * RARE  PSEUDOMONAS AERUGINOSA  MRSA Next Gen by PCR, Nasal     Status: Abnormal   Collection Time: 06/05/23  5:23 PM   Specimen: Nasal Mucosa; Nasal Swab  Result Value Ref Range Status   MRSA by PCR Next Gen DETECTED (A) NOT DETECTED Final    Comment: RESULT CALLED TO, READ BACK BY AND VERIFIED WITH: RN SHIRLENE BALDEZ ON 06/05/23 @ 2051 BY DRT (NOTE) The GeneXpert MRSA Assay (FDA approved for NASAL specimens only), is one component of a comprehensive MRSA colonization surveillance program. It is not intended to diagnose MRSA infection nor to guide or monitor treatment for MRSA infections. Test performance is not FDA approved in patients less than 75 years old. Performed at [facility], [address], [place], [place] [address]      Serology:   Imaging: If present, new imagings (plain films, ct scans, and mri) have been personally visualized and interpreted; radiology reports have been reviewed. Decision making incorporated into the Impression / Recommendations.  8/14 abd pelv ct with contrast FINDINGS: Lower chest: No focal consolidation or pulmonary nodule in the lung bases. Increased large bilateral pleural effusions. Partially imaged heart size is normal. Coronary artery calcifications.   Hepatobiliary: No focal hepatic lesions. No intra or extrahepatic biliary ductal dilation. Cholelithiasis.   Pancreas: No focal lesions or main ductal dilation.   Spleen: Normal in size without focal abnormality.   Adrenals/Urinary Tract: No adrenal nodules. Similar left hydroureteronephrosis to the level of the decompressed urinary bladder. New increased hyperattenuation of the distal left ureter (3:75). No right hydronephrosis. No suspicious renal masses or calculi. Urinary catheter in-situ.   Stomach/Bowel: Normal appearance of the stomach. No bowel wall thickening. Mildly dilated rectum contains inspissated stool. Normal appendix.   Vascular/Lymphatic: Aortic atherosclerosis.  No enlarged abdominal or pelvic lymph nodes.   Reproductive: Prostate is unremarkable.   Other: Increased small volume ascites.  No free air or fluid collection.   Musculoskeletal: No acute or abnormal lytic or blastic osseous lesions. Diffuse body wall edema. Previously noted subcutaneous emphysema is no longer seen in the partially imaged pubic region.   IMPRESSION: 1. Similar left hydroureteronephrosis to the level of the decompressed urinary bladder. New increased hyperattenuation of the distal left ureter, which may represent blood products or mural thickening. Recommend consultation to Urology with cystoscopic evaluation as clinically indicated. 2. Increased large bilateral pleural effusions. Increased small volume ascites. 3. Mildly dilated rectum contains inspissated stool. 4. Previously noted subcutaneous emphysema is no longer seen in the partially imaged pubic region.   Raymondo Band, MD [facility] [phone] pager    06/07/2023, 2:42 PM

## 2023-06-07 NOTE — Progress Notes (Addendum)
Pharmacy Antibiotic Note  Peter Yang is a 67 y.o. male admitted on 06/04/2023 with UTI and scrotal wound infection .  Pharmacy has been consulted for vancomycin and ceftolozane/tazobactam dosing per ID. He had Fournier's gangrene 03/2023 and is s/p debridement and antibiotics. He had vancomycin 2500 mg IV load early this morning. He also had Zosyn 3.375 g IV on 8/14 at 13:58.   He is afebrile, WBC are normal, and renal function is normal. UCx growing pseudomonas and klebsiella.   Plan: Continue vancomycin 1250 mg IV q12h Goal AUC 400-550, eAUC 533.6, SCr used 0.96, Vd 0.5  Start ceftolozane/tazobactam 1.5g q8hours  Monitor renal function, clinical progress, cultures/sensitivities F/U LOT and de-escalate as able Vancomycin levels as clinically indicated   Height: 6\' 2"  (188 cm) Weight: 117 kg (257 lb 15 oz) IBW/kg (Calculated) : 82.2  Temp (24hrs), Avg:97.6 F (36.4 C), Min:97.3 F (36.3 C), Max:97.9 F (36.6 C)  Recent Labs  Lab 06/04/23 0644 06/04/23 0646 06/04/23 0650 06/04/23 0950 06/05/23 0121 06/06/23 0311 06/07/23 0400  WBC 9.3  --   --   --  6.8  --  6.8  CREATININE  --  0.92  --   --  0.96 0.83 0.80  LATICACIDVEN  --   --  2.1* 1.8  --   --   --     Estimated Creatinine Clearance: 123.5 mL/min (by C-G formula based on SCr of 0.8 mg/dL).    Allergies  Allergen Reactions   Sglt2 Inhibitors Other (See Comments)    Fournier's gangrene    Antimicrobials this admission: Vancomycin 8/15 >>  Ceftolozane/tazobactam 8/17 >>  Zosyn 8/14 x1 Cefepime 8/15 > 8/17   Dose adjustments this admission: N/A  Microbiology results: 8/14 BCx: ngtd 8/14 UCx: PSA  8/14: Wound Culture: PSA and Kleb - ESBL   Thank you for involving pharmacy in this patient's care.  Blane Ohara, PharmD  PGY2 Pharmacy Resident

## 2023-06-08 ENCOUNTER — Inpatient Hospital Stay (HOSPITAL_COMMUNITY): Payer: Medicare Other | Admitting: Anesthesiology

## 2023-06-08 ENCOUNTER — Encounter (HOSPITAL_COMMUNITY)
Admission: EM | Disposition: A | Discharge status: Skilled Nursing Facility | Payer: Self-pay | Source: Skilled Nursing Facility | Attending: Internal Medicine

## 2023-06-08 ENCOUNTER — Other Ambulatory Visit: Payer: Self-pay

## 2023-06-08 ENCOUNTER — Encounter (HOSPITAL_COMMUNITY): Payer: Self-pay | Admitting: Internal Medicine

## 2023-06-08 DIAGNOSIS — I5032 Chronic diastolic (congestive) heart failure: Secondary | ICD-10-CM | POA: Diagnosis not present

## 2023-06-08 DIAGNOSIS — Z8619 Personal history of other infectious and parasitic diseases: Secondary | ICD-10-CM | POA: Diagnosis not present

## 2023-06-08 DIAGNOSIS — E1152 Type 2 diabetes mellitus with diabetic peripheral angiopathy with gangrene: Secondary | ICD-10-CM

## 2023-06-08 DIAGNOSIS — N493 Fournier gangrene: Secondary | ICD-10-CM | POA: Diagnosis not present

## 2023-06-08 DIAGNOSIS — I110 Hypertensive heart disease with heart failure: Secondary | ICD-10-CM | POA: Diagnosis not present

## 2023-06-08 DIAGNOSIS — L089 Local infection of the skin and subcutaneous tissue, unspecified: Secondary | ICD-10-CM | POA: Diagnosis not present

## 2023-06-08 HISTORY — PX: SCROTAL EXPLORATION: SHX2386

## 2023-06-08 HISTORY — PX: CYSTOSCOPY: SHX5120

## 2023-06-08 LAB — BASIC METABOLIC PANEL
Anion gap: 7 (ref 5–15)
BUN: 29 mg/dL — ABNORMAL HIGH (ref 8–23)
CO2: 24 mmol/L (ref 22–32)
Calcium: 8.2 mg/dL — ABNORMAL LOW (ref 8.9–10.3)
Chloride: 100 mmol/L (ref 98–111)
Creatinine, Ser: 0.96 mg/dL (ref 0.61–1.24)
GFR, Estimated: >60 mL/min (ref >60)
Glucose, Bld: 185 mg/dL — ABNORMAL HIGH (ref 70–99)
Potassium: 3.6 mmol/L (ref 3.5–5.1)
Sodium: 131 mmol/L — ABNORMAL LOW (ref 135–145)

## 2023-06-08 LAB — GLUCOSE, CAPILLARY
Glucose-Capillary: 134 mg/dL — ABNORMAL HIGH (ref 70–99)
Glucose-Capillary: 120 mg/dL — ABNORMAL HIGH (ref 70–99)
Glucose-Capillary: 123 mg/dL — ABNORMAL HIGH (ref 70–99)
Glucose-Capillary: 144 mg/dL — ABNORMAL HIGH (ref 70–99)
Glucose-Capillary: 180 mg/dL — ABNORMAL HIGH (ref 70–99)
Glucose-Capillary: 289 mg/dL — ABNORMAL HIGH (ref 70–99)
Glucose-Capillary: 318 mg/dL — ABNORMAL HIGH (ref 70–99)

## 2023-06-08 LAB — VANCOMYCIN, PEAK: Vancomycin Pk: 40 ug/mL (ref 30–40)

## 2023-06-08 SURGERY — EXPLORATION, SCROTUM
Anesthesia: General | Site: Urethra

## 2023-06-08 MED ORDER — ALBUMIN HUMAN 5 % IV SOLN
INTRAVENOUS | Status: DC | PRN
Start: 2023-06-08 — End: 2023-06-08

## 2023-06-08 MED ORDER — FENTANYL CITRATE (PF) 100 MCG/2ML IJ SOLN
INTRAMUSCULAR | Status: AC
  Filled 2023-06-08: qty 2

## 2023-06-08 MED ORDER — 0.9 % SODIUM CHLORIDE (POUR BTL) OPTIME
TOPICAL | Status: DC | PRN
  Administered 2023-06-08: 1000 mL

## 2023-06-08 MED ORDER — FENTANYL CITRATE (PF) 250 MCG/5ML IJ SOLN
INTRAMUSCULAR | Status: DC | PRN
  Administered 2023-06-08 (×2): 25 ug via INTRAVENOUS

## 2023-06-08 MED ORDER — OXYCODONE HCL 5 MG PO TABS: 5.0000 mg | ORAL_TABLET | Freq: Once | ORAL | Status: DC | PRN

## 2023-06-08 MED ORDER — CHLORHEXIDINE GLUCONATE 0.12 % MT SOLN
OROMUCOSAL | Status: AC
  Administered 2023-06-08: 15 mL via OROMUCOSAL
  Filled 2023-06-08: qty 15

## 2023-06-08 MED ORDER — PROPOFOL 10 MG/ML IV BOLUS
INTRAVENOUS | Status: AC
  Filled 2023-06-08: qty 20

## 2023-06-08 MED ORDER — PHENYLEPHRINE HCL-NACL 20-0.9 MG/250ML-% IV SOLN
INTRAVENOUS | Status: DC | PRN
  Administered 2023-06-08: 25 ug/min via INTRAVENOUS

## 2023-06-08 MED ORDER — PROPOFOL 10 MG/ML IV BOLUS
INTRAVENOUS | Status: DC | PRN
  Administered 2023-06-08: 50 mg via INTRAVENOUS

## 2023-06-08 MED ORDER — EPHEDRINE SULFATE-NACL 50-0.9 MG/10ML-% IV SOSY
PREFILLED_SYRINGE | INTRAVENOUS | Status: DC | PRN
  Administered 2023-06-08: 10 mg via INTRAVENOUS
  Administered 2023-06-08 (×2): 5 mg via INTRAVENOUS

## 2023-06-08 MED ORDER — CHLORHEXIDINE GLUCONATE 0.12 % MT SOLN: 15.0000 mL | Freq: Once | OROMUCOSAL | Status: AC

## 2023-06-08 MED ORDER — LIDOCAINE 2% (20 MG/ML) 5 ML SYRINGE
INTRAMUSCULAR | Status: DC | PRN
  Administered 2023-06-08: 100 mg via INTRAVENOUS

## 2023-06-08 MED ORDER — MIDAZOLAM HCL 2 MG/2ML IJ SOLN
INTRAMUSCULAR | Status: AC
  Filled 2023-06-08: qty 2

## 2023-06-08 MED ORDER — ACETAMINOPHEN 10 MG/ML IV SOLN
INTRAVENOUS | Status: AC
  Filled 2023-06-08: qty 100

## 2023-06-08 MED ORDER — ACETAMINOPHEN 10 MG/ML IV SOLN
1000.0000 mg | Freq: Once | INTRAVENOUS | Status: DC | PRN
  Administered 2023-06-08: 1000 mg via INTRAVENOUS

## 2023-06-08 MED ORDER — ONDANSETRON HCL 4 MG/2ML IJ SOLN: 4.0000 mg | Freq: Once | INTRAMUSCULAR | Status: DC | PRN

## 2023-06-08 MED ORDER — FENTANYL CITRATE (PF) 100 MCG/2ML IJ SOLN
25.0000 ug | INTRAMUSCULAR | Status: DC | PRN
  Administered 2023-06-08 (×4): 50 ug via INTRAVENOUS

## 2023-06-08 MED ORDER — OXYCODONE HCL 5 MG/5ML PO SOLN: 5.0000 mg | Freq: Once | ORAL | Status: DC | PRN

## 2023-06-08 MED ORDER — ORAL CARE MOUTH RINSE: 15.0000 mL | Freq: Once | OROMUCOSAL | Status: AC

## 2023-06-08 MED ORDER — FENTANYL CITRATE (PF) 250 MCG/5ML IJ SOLN
INTRAMUSCULAR | Status: AC
  Filled 2023-06-08: qty 5

## 2023-06-08 MED ORDER — DEXAMETHASONE SODIUM PHOSPHATE 10 MG/ML IJ SOLN
INTRAMUSCULAR | Status: DC | PRN
  Administered 2023-06-08: 10 mg via INTRAVENOUS

## 2023-06-08 MED ORDER — STERILE WATER FOR IRRIGATION IR SOLN
Status: DC | PRN
  Administered 2023-06-08: 3000 mL

## 2023-06-08 MED ORDER — ONDANSETRON HCL 4 MG/2ML IJ SOLN
INTRAMUSCULAR | Status: DC | PRN
Start: 2023-06-08 — End: 2023-06-08
  Administered 2023-06-08: 4 mg via INTRAVENOUS

## 2023-06-08 MED ORDER — LACTATED RINGERS IV SOLN: INTRAVENOUS | Status: DC

## 2023-06-08 MED ORDER — INSULIN ASPART 100 UNIT/ML IJ SOLN: 0.0000 [IU] | INTRAMUSCULAR | Status: DC | PRN

## 2023-06-08 SURGICAL SUPPLY — 35 items
BAG COUNTER SPONGE SURGICOUNT (BAG) ×2 IMPLANT
BAG DRN RND TRDRP ANRFLXCHMBR (UROLOGICAL SUPPLIES) ×2
BAG SPNG CNTER NS LX DISP (BAG) ×2
BAG URINE DRAIN 2000ML AR STRL (UROLOGICAL SUPPLIES) IMPLANT
BAG URO CATCHER STRL LF (MISCELLANEOUS) ×2 IMPLANT
BLADE SURG 15 STRL LF DISP TIS (BLADE) ×4 IMPLANT
BLADE SURG 15 STRL SS (BLADE)
BNDG GAUZE DERMACEA FLUFF 4 (GAUZE/BANDAGES/DRESSINGS) IMPLANT
BNDG GZE DERMACEA 4 6PLY (GAUZE/BANDAGES/DRESSINGS) ×2
BOOT FULL COVERAGE XLG IMPLANT
CANISTER SUCT 3000ML PPV (MISCELLANEOUS) ×2 IMPLANT
CNTNR URN SCR LID CUP LEK RST (MISCELLANEOUS) ×2 IMPLANT
CONT SPEC 4OZ STRL OR WHT (MISCELLANEOUS)
ELECT REM PT RETURN 9FT ADLT (ELECTROSURGICAL) ×2
ELECTRODE REM PT RTRN 9FT ADLT (ELECTROSURGICAL) ×2 IMPLANT
GAUZE PAD ABD 8X10 STRL (GAUZE/BANDAGES/DRESSINGS) IMPLANT
GAUZE SPONGE 4X4 12PLY STRL (GAUZE/BANDAGES/DRESSINGS) ×2 IMPLANT
GLOVE ORTHO TXT STRL SZ7.5 (GLOVE) ×2 IMPLANT
HOLDER FOLEY CATH W/STRAP (MISCELLANEOUS) IMPLANT
KIT BASIN OR (CUSTOM PROCEDURE TRAY) ×2 IMPLANT
KIT TURNOVER KIT B (KITS) ×2 IMPLANT
NS IRRIG 1000ML POUR BTL (IV SOLUTION) ×4 IMPLANT
PACK CYSTO (CUSTOM PROCEDURE TRAY) ×2 IMPLANT
PACK GENERAL/GYN (CUSTOM PROCEDURE TRAY) ×2 IMPLANT
PAD ARMBOARD 7.5X6 YLW CONV (MISCELLANEOUS) ×4 IMPLANT
SET CYSTO W/LG BORE CLAMP LF (SET/KITS/TRAYS/PACK) IMPLANT
SOL PREP POV-IOD 4OZ 10% (MISCELLANEOUS) ×2 IMPLANT
SPONGE T-LAP 18X18 ~~LOC~~+RFID (SPONGE) IMPLANT
SURGILUBE 2OZ TUBE FLIPTOP (MISCELLANEOUS) IMPLANT
SUT CHROMIC 3 0 SH 27 (SUTURE) ×2 IMPLANT
SWAB COLLECTION DEVICE MRSA (MISCELLANEOUS) ×2 IMPLANT
SYR 10ML LL (SYRINGE) IMPLANT
TOWEL GREEN STERILE (TOWEL DISPOSABLE) ×2 IMPLANT
TOWEL GREEN STERILE FF (TOWEL DISPOSABLE) ×2 IMPLANT
WATER STERILE IRR 1000ML POUR (IV SOLUTION) ×2 IMPLANT

## 2023-06-08 NOTE — Anesthesia Postprocedure Evaluation (Signed)
Anesthesia Post Note  Patient: Peter Yang  Procedure(s) Performed: SCROTUM EXPLORATION (Scrotum) CYSTOSCOPY (Urethra)     Patient location during evaluation: PACU Anesthesia Type: General Level of consciousness: awake and alert Pain management: pain level controlled Vital Signs Assessment: post-procedure vital signs reviewed and stable Respiratory status: spontaneous breathing, nonlabored ventilation, respiratory function stable and patient connected to nasal cannula oxygen Cardiovascular status: blood pressure returned to baseline and stable Postop Assessment: no apparent nausea or vomiting Anesthetic complications: no   No notable events documented.  Last Vitals:  Vitals:   06/08/23 1200 06/08/23 1215  BP: 114/71 109/69  Pulse: 78 80  Resp: 10 10  Temp:    SpO2: 91% 97%    Last Pain:  Vitals:   06/08/23 1200  TempSrc:   PainSc: 7                  Mariann Barter

## 2023-06-08 NOTE — Progress Notes (Signed)
Patient informs me he is not aware about the surgery and will sign his consent after speaking to the surgeon

## 2023-06-08 NOTE — Transfer of Care (Signed)
Immediate Anesthesia Transfer of Care Note  Patient: Peter Yang  Procedure(s) Performed: SCROTUM EXPLORATION  Patient Location: PACU  Anesthesia Type:General  Level of Consciousness: awake, drowsy, and patient cooperative  Airway & Oxygen Therapy: Patient Spontanous Breathing and Patient connected to face mask oxygen  Post-op Assessment: Report given to RN and Post -op Vital signs reviewed and stable  Post vital signs: Reviewed and stable  Last Vitals:  Vitals Value Taken Time  BP 116/68 06/08/23 1152  Temp    Pulse 83 06/08/23 1154  Resp 10 06/08/23 1154  SpO2 93 % 06/08/23 1154  Vitals shown include unfiled device data.  Last Pain:  Vitals:   06/08/23 0942  TempSrc: Oral  PainSc:       Patients Stated Pain Goal: 0 (06/07/23 2253)  Complications: No notable events documented.

## 2023-06-08 NOTE — Anesthesia Procedure Notes (Addendum)
Procedure Name: LMA Insertion Date/Time: 06/08/2023 10:47 AM  Performed by: Lelon Perla, CRNAPre-anesthesia Checklist: Patient identified, Emergency Drugs available, Suction available and Patient being monitored Patient Re-evaluated:Patient Re-evaluated prior to induction Oxygen Delivery Method: Circle System Utilized Preoxygenation: Pre-oxygenation with 100% oxygen Induction Type: IV induction Ventilation: Mask ventilation without difficulty LMA: LMA inserted LMA Size: 4.0 Number of attempts: 1 Airway Equipment and Method: Bite block Placement Confirmation: positive ETCO2 Tube secured with: Tape Dental Injury: Teeth and Oropharynx as per pre-operative assessment

## 2023-06-08 NOTE — Progress Notes (Signed)
   Day of Surgery Subjective: Patient is awake and alert today.  He has increased drainage from the scrotal wounds and I have been requested to go ahead and reexplore today.    I have reviewed the procedure and risks with [name].      Objective: Vital signs in last 24 hours: Temp:  [97.6 F (36.4 C)-98 F (36.7 C)] 97.9 F (36.6 C) (08/18 0942) Pulse Rate:  [57-82] 82 (08/18 0942) Resp:  [12-20] 12 (08/18 0942) BP: (99-125)/(65-76) 125/70 (08/18 0942) SpO2:  [96 %-98 %] 98 % (08/18 0942) Weight:  [112.3 kg-112.4 kg] 112.4 kg (08/18 0945)    Assessment/Plan: # Purulent drainage from right side incision Multiple requests have been made to have our office put in contact with his caretaker at his long-term care facility to ensure that he is seen in clinic for reassessment and suture removal.  This will be at least his second readmission since his initial surgery without follow-up.  Sutures have been left in place for significantly longer than normal due to severe protein calorie malnutrition/anasarca and delayed wound healing.  I removed all 10 of them.   There was initially a pinhole sinus draining at the bottom of the right side suture. Significant drainage from left side today. It is not large enough to probe or pack.  Would culture (+) Kleb,Pseud. Broadly covered. Will watch over the weekend and if output worsens, consider I&D. UC (+) Pseudomonas   #hydronephrosis Chronic in nature.  This has been noted on multiple CT images after adequate time provided for urinary decompression. Preserved renal function    Intake/Output from previous day: 08/17 0701 - 08/18 0700 In: 1180 [P.O.:1080; IV Piggyback:100] Out: 5925 [Urine:5925]  Intake/Output this shift: No intake/output data recorded.  Physical Exam:  General: awake and alert CV: No cyanosis Lungs: equal chest rise   Lab Results: Recent Labs    06/07/23 0400  HGB 10.2*  HCT 32.7*   BMET Recent Labs     06/07/23 0400 06/08/23 0205  NA 131* 131*  K 4.0 3.6  CL 98 100  CO2 21* 24  GLUCOSE 188* 185*  BUN 20 29*  CREATININE 0.80 0.96  CALCIUM 8.0* 8.2*     Studies/Results: No results found.    LOS: 4 days   Elmon Kirschner, NP [facility] Pager: [phone]  06/08/2023, 10:18 AM Patient ID: Peter Yang, male   DOB: 08/04/1956, 67 y.o.   MRN: [id number] Patient ID: Peter Yang, male   DOB: 10-Jul-1956, 67 y.o.   MRN: [id number]

## 2023-06-08 NOTE — TOC CAGE-AID Note (Signed)
Transition of Care [facility]) - CAGE-AID Screening   Patient Details  Name: Peter Yang MRN: [id number] Date of Birth: 1956/10/07  [facility]) CM/SW Contact:    Katha Hamming, RN Phone Number: 06/08/2023, 9:10 PM  CAGE-AID Screening:    Have You Ever Felt You Ought to Cut Down on Your Drinking or Drug Use?: No Have People Annoyed You By Critizing Your Drinking Or Drug Use?: No Have You Felt Bad Or Guilty About Your Drinking Or Drug Use?: No Have You Ever Had a Drink or Used Drugs First Thing In The Morning to Steady Your Nerves or to Get Rid of a Hangover?: No CAGE-AID Score: 0  Substance Abuse Education Offered: No

## 2023-06-08 NOTE — Op Note (Signed)
Procedure: Bilateral scrotal exploration and cystoscopy.  Preop diagnosis: History of Fournier's gangrene with persistent wound drainage.  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  Surgeon: Dr. Bjorn Pippin.  Anesthesia: General.  Specimen: None.  Drains: 70 French coud Foley catheter.  EBL: 5 mL.  Complications: None.  Indications: The patient is a 67 year old male with a history of Fournier's gangrene with prior bilateral scrotal incisions with the debridement.  He has had issues with poor wound healing and persistent purulent drainage from the wounds, left greater than right, and the decision was made to proceed with reexploration over the weekend because of his persistent drainage and pseudomonal infection.  Procedure: He was taken to the operating room where general anesthetic was induced.  He had been on antibiotics preoperatively.  He was placed in the lithotomy position and a right PAS hose was placed.  He has a left BKA.  His Foley catheter was removed and then he was prepped with Betadine solution.  He was draped in usual sterile fashion.  A fresh 18 French coud Foley catheter was easily placed into the bladder and the balloon was filled with 10 mL of sterile fluid.  There was clear return after some initial bloody fluid.  Initially I used my finger to probe the left scrotal incision which opened easily.  There was a deep cavity with thin purulent drainage.  I did breakdown some adhesions within the wound and probed superiorly opening it a bit further in this area.  There was some fibrinous exudate but no necrotic tissue to be debrided.  I then also used my finger to probe the left scrotal incision which easily opened exposing a similar but not quite as large cavity which had opened further breaking down the tissue adhesions both sides were then irrigated.  During my initial manipulation I noted some bleeding around the Foley catheter raising a concern for  urethral involvement.  The Foley catheter was removed and cystoscopy was performed using a 21 French cystoscope with a 30 degree lens.  The anterior urethra had some irritation but no injury but in the posterior bulb there was some granulation tissue that is very suspicious for a chronic fistula.  Because of angulation I was not able to get into the bladder.  The 68 French coud Foley catheter was then reinserted without difficulty and the balloon was filled with 10 mL of sterile fluid.  The right and left scrotal incisions were then packed with saline soaked Kerlix gauze.  The cath was placed to straight drainage.  ABDs were used to cover the incisions and then mesh panties were placed as the patient was taken down from lithotomy position.  His anesthetic was reversed and he was moved to recovery in stable condition.  There were no complications.  With urethral fistula I think will be best served with placement of a suprapubic tube by IR to divert the urine from the urethra and he may need return to the OR for a dressing change in the next 48 hours.

## 2023-06-08 NOTE — Consult Note (Signed)
WOC Nurse Consult Note: Reason for Consult:scrotal wound.  WOC Nursing was simultaneously consulted with Urology and Urology took patient back to surgery today (Dr. Annabell Howells). They are following and there is no role for WOC nursing at this time. Dr. Belva Crome note indicates that patient may return to OR for a dressing change in the next 48 hours.   Wound type: Infectious, surgical  WOC nursing team did not see and will not follow.  Thank you for inviting [place] to participate in this patient's Plan of Care.  Ladona Mow, MSN, RN, CNS, GNP, Leda Min, [employer], [employer] phone:  [phone]

## 2023-06-08 NOTE — Progress Notes (Signed)
[facility] PROGRESS NOTE  Peter Yang (DOB: 29-Sep-1956) ZOX:[id number] PCP: Patient, No Pcp Per Outpatient Specialists: [facility], Dr. Drue Second  Brief Narrative: Peter Yang is a 67 y.o. male with a history of T2DM with polyneuropathy, DFU s/p left BKA, CAD s/p stent, HLD, PAF, HFrEF, adrenal insufficiency, CVA, BPH with chronic foley, and recent surgeries for Fournier's gangrene June 2024 having completed IV antibiotics 7/30 who presented to the ED on 06/04/2023 after falling out of bed at [facility]. He was afebrile with dehiscence of left scrotal wound with discharge. Work up showed WBC 9.3k, ESR 4, PCT <0.10, CRP 2.9 (down from 20.7 on 8/7), lactic acid 2.1 > 1.8. IV antibiotics were given and urology consulted.  Subjective: Slept ok, pain controlled, discussed surgery with urology and consents, going down now. No new issues.   Objective: BP 125/70   Pulse 82   Temp 97.9 F (36.6 C) (Oral)   Resp 12   Ht 6' 2.02" (1.88 m)   Wt 112.4 kg   SpO2 98%   BMI 31.80 kg/m   Gen: No distress Pulm: Clear, nonlabored  CV: Edema improving GI: Soft, NT, ND, +BS Neuro: Alert and oriented. No new focal deficits. Ext: Warm, left BKA Skin: Scrotum not reexamined (pt being transported to OR at this time).    Assessment & Plan: Anasarca, acute on chronic combined HFrEF, bilateral pleural effusions: Diffuse body edema also attributable to hypoalbuminemia/malnutrition.  - Respiratory status remains quite stable. Will monitor effusions while diuresing.  - Continue lasix 40mg  IV BID, had 4L UOP yesterday, SCr relatively stable, may deescalate 8/19. BP is tolerating.  - RD consulted, supplementing protein  Scrotal surgical wound infection s/p debridement x2 for Fournier's gangrene:  - Continue MRSA coverage with vancomycin and Pseudomonal coverage based on MDR cultures with ceftolozane-tazobactam per ID.  - Output has increased, concerned with resistance profile that early surgical intervention is  required. Conveyed this to urology. We've held eliquis, made NPO, pt going for surgery this morning. Blood cultures NGTD. - Wound care deferred to urology  Vitamin A deficiency:  - Supplement - Also empirically supplement zinc and vitamin C to aid wound healing.  CAUTI vs. asymptomatic bacteriuria: - Urology to exchange foley, last placed 7/22 with great difficulty. We're diuresing hoping to improve things - Urine culture with Pseudomonas, started cefepime pending susceptibilities. Was given zosyn. Culture with multidrug resistance but without urinary symptoms, no directed treatment for this per ID.  Hematuria: Chronic but currently resolved.  - Resolved.   Right foot wound: No exudate or odor at this time.  - Continue regular podiatry follow up, optimize nutritional status and glycemic control.  - Keep covered, continue daily wound care per WOC.  - Offload with prevalon boot  Stage 2 sacral pressure injury: POA:  - Offload, foam dressing w/medihoney.   IDT2DM: Last HbA1c 7% shows adequate glycemic control to assist wound healing.  - Continue glargine and SSI. At inpatient goal.  PAF: In atrial fibrillation with CVR.   - Restart eliquis postop, timing deferred to urology, not on rate-control agent at baseline.  Tinea cruris:  - Continue nystatin powder.   Chronic pain:  - No changes  Hypokalemia:  - Supplemented, resolved. Monitoring daily with diuresis  Hypomagnesemia:  - Supplemented, recheck in AM.  Chronic blood loss anemia: Above previous baseline, even after hemodilution. No active bleeding noted.  - Monitor intermittently.  - Continue iron supplement with bowel regimen  Adrenal insufficiency:  - Continue home dose cortef, continue baseline dose  midodrine. Low threshold for stress dose.  Hydronephrosis, BPH:  - Renal function stable, defer management to urology. - Continue tamsulosin, finasteride, and foley  Fall out of bed: Negative CT head, cervical  spine. - Fall precautions  HLD:  - Continue statin  Depression:  - Continue escitalopram  [name], MD [facility] [email or web address] 06/08/2023, 10:02 AM

## 2023-06-08 NOTE — Anesthesia Preprocedure Evaluation (Addendum)
Anesthesia Evaluation  Patient identified by MRN, date of birth, ID band Patient awake    Reviewed: Allergy & Precautions, NPO status , Patient's Chart, lab work & pertinent test results, reviewed documented beta blocker date and time   History of Anesthesia Complications Negative for: history of anesthetic complications  Airway Mallampati: III  TM Distance: >3 FB Neck ROM: Limited    Dental no notable dental hx.    Pulmonary sleep apnea    breath sounds clear to auscultation       Cardiovascular hypertension, + CAD, + Cardiac Stents and + Peripheral Vascular Disease  + dysrhythmias Atrial Fibrillation + Valvular Problems/Murmurs MR  Rhythm:Irregular Rate:Normal  30-35% EF, severe MR   Neuro/Psych TIA Neuromuscular disease    GI/Hepatic   Endo/Other  diabetes, Poorly Controlled  Adrenal insufficiency on home steroids  Renal/GU      Musculoskeletal   Abdominal   Peds  Hematology  (+) Blood dyscrasia, anemia   Anesthesia Other Findings   Reproductive/Obstetrics                              Anesthesia Physical Anesthesia Plan  ASA: 3  Anesthesia Plan: General   Post-op Pain Management:    Induction: Intravenous  PONV Risk Score and Plan: 1 and Ondansetron  Airway Management Planned: LMA  Additional Equipment:   Intra-op Plan:   Post-operative Plan: Extubation in OR  Informed Consent: I have reviewed the patients History and Physical, chart, labs and discussed the procedure including the risks, benefits and alternatives for the proposed anesthesia with the patient or authorized representative who has indicated his/her understanding and acceptance.       Plan Discussed with: CRNA  Anesthesia Plan Comments:         Anesthesia Quick Evaluation

## 2023-06-09 ENCOUNTER — Encounter (HOSPITAL_COMMUNITY): Payer: Self-pay | Admitting: Internal Medicine

## 2023-06-09 ENCOUNTER — Inpatient Hospital Stay (HOSPITAL_COMMUNITY): Payer: Medicare Other

## 2023-06-09 DIAGNOSIS — L089 Local infection of the skin and subcutaneous tissue, unspecified: Secondary | ICD-10-CM | POA: Diagnosis not present

## 2023-06-09 DIAGNOSIS — T8131XD Disruption of external operation (surgical) wound, not elsewhere classified, subsequent encounter: Secondary | ICD-10-CM

## 2023-06-09 DIAGNOSIS — Z8619 Personal history of other infectious and parasitic diseases: Secondary | ICD-10-CM | POA: Diagnosis not present

## 2023-06-09 LAB — BASIC METABOLIC PANEL
Anion gap: 10 (ref 5–15)
BUN: 32 mg/dL — ABNORMAL HIGH (ref 8–23)
CO2: 23 mmol/L (ref 22–32)
Calcium: 8.3 mg/dL — ABNORMAL LOW (ref 8.9–10.3)
Chloride: 99 mmol/L (ref 98–111)
Creatinine, Ser: 0.8 mg/dL (ref 0.61–1.24)
GFR, Estimated: >60 mL/min (ref >60)
Glucose, Bld: 238 mg/dL — ABNORMAL HIGH (ref 70–99)
Potassium: 3.5 mmol/L (ref 3.5–5.1)
Sodium: 132 mmol/L — ABNORMAL LOW (ref 135–145)

## 2023-06-09 LAB — CBC
HCT: 32 % — ABNORMAL LOW (ref 39.0–52.0)
Hemoglobin: 10 g/dL — ABNORMAL LOW (ref 13.0–17.0)
MCH: 27.3 pg (ref 26.0–34.0)
MCHC: 31.3 g/dL (ref 30.0–36.0)
MCV: 87.4 fL (ref 80.0–100.0)
Platelets: 173 10*3/uL (ref 150–400)
RBC: 3.66 MIL/uL — ABNORMAL LOW (ref 4.22–5.81)
RDW: 23.8 % — ABNORMAL HIGH (ref 11.5–15.5)
WBC: 8.7 10*3/uL (ref 4.0–10.5)
nRBC: 0 % (ref 0.0–0.2)

## 2023-06-09 LAB — CULTURE, BLOOD (ROUTINE X 2)
Culture: NO GROWTH
Culture: NO GROWTH
Special Requests: ADEQUATE
Special Requests: ADEQUATE

## 2023-06-09 LAB — GLUCOSE, CAPILLARY
Glucose-Capillary: 207 mg/dL — ABNORMAL HIGH (ref 70–99)
Glucose-Capillary: 216 mg/dL — ABNORMAL HIGH (ref 70–99)
Glucose-Capillary: 164 mg/dL — ABNORMAL HIGH (ref 70–99)

## 2023-06-09 LAB — VANCOMYCIN, TROUGH: Vancomycin Tr: 29 ug/mL (ref 15–20)

## 2023-06-09 MED ORDER — FENTANYL CITRATE (PF) 100 MCG/2ML IJ SOLN
INTRAMUSCULAR | Status: AC
  Filled 2023-06-09: qty 4

## 2023-06-09 MED ORDER — MIDAZOLAM HCL 2 MG/2ML IJ SOLN
INTRAMUSCULAR | Status: AC | PRN
  Administered 2023-06-09 (×3): 1 mg via INTRAVENOUS

## 2023-06-09 MED ORDER — MIDAZOLAM HCL 2 MG/2ML IJ SOLN
INTRAMUSCULAR | Status: AC
  Filled 2023-06-09: qty 2

## 2023-06-09 MED ORDER — MIDAZOLAM HCL 2 MG/2ML IJ SOLN
INTRAMUSCULAR | Status: AC
  Filled 2023-06-09: qty 4

## 2023-06-09 MED ORDER — SODIUM CHLORIDE 0.9 % IV SOLN
3.0000 g | Freq: Three times a day (TID) | INTRAVENOUS | Status: DC
  Administered 2023-06-09 – 2023-06-11 (×5): 3 g via INTRAVENOUS
  Filled 2023-06-09 (×8): qty 22.8

## 2023-06-09 MED ORDER — SODIUM CHLORIDE (PF) 0.9 % IJ SOLN
INTRAMUSCULAR | Status: AC | PRN
  Administered 2023-06-09: 250 mL via INTRAVENOUS

## 2023-06-09 MED ORDER — LIDOCAINE HCL 1 % IJ SOLN
10.0000 mL | Freq: Once | INTRAMUSCULAR | Status: AC
  Administered 2023-06-09: 10 mL via INTRADERMAL

## 2023-06-09 MED ORDER — FENTANYL CITRATE (PF) 100 MCG/2ML IJ SOLN
INTRAMUSCULAR | Status: AC
  Filled 2023-06-09: qty 2

## 2023-06-09 MED ORDER — POTASSIUM CHLORIDE CRYS ER 20 MEQ PO TBCR
40.0000 meq | EXTENDED_RELEASE_TABLET | Freq: Once | ORAL | Status: AC
  Administered 2023-06-09: 40 meq via ORAL
  Filled 2023-06-09: qty 2

## 2023-06-09 MED ORDER — FENTANYL CITRATE (PF) 100 MCG/2ML IJ SOLN
INTRAMUSCULAR | Status: AC | PRN
  Administered 2023-06-09 (×4): 50 ug via INTRAVENOUS

## 2023-06-09 MED ORDER — VANCOMYCIN HCL 1250 MG/250ML IV SOLN: 1250.0000 mg | INTRAVENOUS | Status: DC

## 2023-06-09 NOTE — Care Management Important Message (Signed)
Important Message  Patient Details  Name: Peter Yang MRN: [id number] Date of Birth: 1956-10-21   Medicare Important Message Given:  Yes     Sherilyn Banker 06/09/2023, 3:16 PM

## 2023-06-09 NOTE — Progress Notes (Signed)
Secure chat with pharmacy. Confirming trough was not drawn on this Pt. Per Pharmacy vancomycin is on hold until results are seen.

## 2023-06-09 NOTE — TOC Progression Note (Signed)
Transition of Care [facility]) - Progression Note    Patient Details  Name: Peter Yang MRN: [id number] Date of Birth: 1956-09-24  Transition of Care [facility]) CM/SW Contact  Carley Hammed, LCSW Phone Number: 06/09/2023, 3:15 PM  Clinical Narrative:     CSW continues to follow for discharge needs. Pt with procedure today, Not medically ready. CSW updated facility who continues to follow and can accept pt whenever he is ready. TOC will continue to follow.   Expected Discharge Plan: Skilled Nursing Facility Barriers to Discharge: Continued Medical Work up  Expected Discharge Plan and Services     Post Acute Care Choice: Skilled Nursing Facility Living arrangements for the past 2 months: Skilled Nursing Facility                                       Social Determinants of Health (SDOH) Interventions SDOH Screenings   Food Insecurity: No Food Insecurity (06/04/2023)  Housing: Low Risk  (06/04/2023)  Transportation Needs: No Transportation Needs (06/04/2023)  Utilities: Not At Risk (06/04/2023)  Financial Resource Strain: Low Risk  (08/13/2018)   Received from [facility] visits prior to 12/21/2022., [facility] visits prior to 12/21/2022.  Tobacco Use: Low Risk  (06/08/2023)    Readmission Risk Interventions    05/07/2023    4:42 PM 10/24/2022   11:21 AM 09/02/2022   10:36 AM  Readmission Risk Prevention Plan  Transportation Screening Complete Complete Complete  Medication Review [employer])  Complete Complete  PCP or Specialist appointment within 3-5 days of discharge Complete Complete Complete  HRI or Home Care Consult Complete Complete Complete  SW Recovery Care/Counseling Consult Complete Complete Complete  Palliative Care Screening Not Applicable Not Applicable Not Applicable  Skilled Nursing Facility Complete Complete Complete

## 2023-06-09 NOTE — Procedures (Signed)
Interventional Radiology Procedure Note  Procedure: CT guided suprapubic bladder catheter placement  Complications: None  Estimated Blood Loss: < 10 mL  Findings: 14 Fr pigtail drain placed in bladder from anterior midline SP approach with return of bloody urine. Attached to gravity drainage bag.  Jodi Marble. Fredia Sorrow, M.D Pager:  [phone]

## 2023-06-09 NOTE — Consult Note (Signed)
Chief Complaint: History of Fornier's gangrene s/p debridement requiring hospitalization two times in  June 2024. Found to have wound dehiscence. 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 Bjorn Pippin  Supervising Physician: Irish Lack  Patient Status: [facility] - In-pt  History of Present Illness: Peter Yang is a 67 y.o. male inpatient. History of DM, TIA, a fib, bowel peroration, HTN, HLD, CAD, MI s/p PCI, BPH. Presented to the ED on 8.14.24 due to injuries related to a fall on anticoagulation. Recent diagnosis of Fornier's gangrene s/p debridement requiring hospitalization two times in  June 2024. Found to have wound dehiscence. 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  Team is requesting a suprapubic catheter for urinary diversion.   Currently without any significant complaints. Patient alert and laying in bed,calm. Denies any fevers, headache, chest pain, SOB, cough, abdominal pain, nausea, vomiting or bleeding. Return precautions and treatment recommendations and follow-up discussed with the patient who is agreeable with the plan.    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 Overview:  Added automatically from request for surgery [id number] Formatting of this note might be different from the original. Added automatically from request for surgery [id number]   Large bowel perforation (HCC) 09/27/2015   Malignant neoplasm of prostate (HCC) 10/22/2019   Myopia with astigmatism and presbyopia, bilateral 06/15/2018   Last Assessment & Plan:  Formatting of this note might be different from the original. - Wrx printed Formatting of this note might be different from the original. 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 - Stable, monitor Formatting of this note might be different from the original. Last Assessment & Plan:  - Stable, monitor   Retroperitoneal abscess (HCC) 10/02/2015   Status post corneal transplant 10/07/2016   Formatting of this note might be different from the original. Overview:  Added automatically from request for surgery [id number] Formatting of this note might be different from the original. Added automatically from request for surgery [id number]  Last Assessment & Plan:  Formatting of this note might be different from the original. - OD, 2/2 KCN - Stable, no NVK / rejection - Continue off steroids - Mo   Thoracic ascending aortic aneurysm (HCC)    TIA (transient ischemic attack) 2012   Type 2 diabetes  mellitus [facility])     Past Surgical History:  Procedure Laterality Date   BELOW KNEE LEG AMPUTATION Left 2022   CORONARY ANGIOPLASTY WITH STENT PLACEMENT  01/2022   DES RCA   IRRIGATION AND  DEBRIDEMENT ABSCESS N/A 04/02/2023   Procedure: IRRIGATION AND DEBRIDEMENT ABSCESS;  Surgeon: Crist Fat, MD;  Location: [facility] OR;  Service: Urology;  Laterality: N/A;   IRRIGATION AND DEBRIDEMENT ABSCESS N/A 04/09/2023   Procedure: SCROTAL WASHOUT AND DEBRIDEMENT WITH WOUND CLOSURE;  Surgeon: Crist Fat, MD;  Location: [facility] OR;  Service: Urology;  Laterality: N/A;  60 MINUTES   LEFT HEART CATH AND CORONARY ANGIOGRAPHY N/A 06/17/2022   Procedure: LEFT HEART CATH AND CORONARY ANGIOGRAPHY;  Surgeon: Yvonne Kendall, MD;  Location: [facility] INVASIVE CV LAB;  Service: Cardiovascular;  Laterality: N/A;   ROTATOR CUFF REPAIR Left     Allergies: Sglt2 inhibitors  Medications: Prior to Admission medications   Medication Sig Start Date End Date Taking? Authorizing Provider  acetaminophen (TYLENOL) 500 MG tablet Take 2 tablets (1,000 mg total) by mouth every 8 (eight) hours. 04/18/23  Yes Vann, Jessica U, DO  albuterol (PROVENTIL) (2.5 MG/3ML) 0.083% nebulizer solution Take 2.5 mg by nebulization every 4 (four) hours as needed for wheezing or shortness of breath.   Yes [provider]  albuterol (VENTOLIN HFA) 108 (90 Base) MCG/ACT inhaler Inhale 2 puffs into the lungs every 4 (four) hours as needed for wheezing or shortness of breath.   Yes [provider]  Amino Acids-Protein Hydrolys (FEEDING SUPPLEMENT, PRO-STAT SUGAR FREE 64,) LIQD Take 30 mLs by mouth daily.   Yes [provider]  apixaban (ELIQUIS) 5 MG TABS tablet Take 1 tablet (5 mg total) by mouth 2 (two) times daily. 08/16/22 08/02/23 Yes Levin Erp, MD  atorvastatin (LIPITOR) 80 MG tablet Take 0.5 tablets (40 mg total) by mouth every evening. 04/18/23  Yes Vann, Jessica U, DO  B Complex-C (B COMPLEX-VITAMIN C PO) Take 1 tablet by mouth daily. B complex/Vitamin C/Folic acid   Yes [provider]  bisacodyl (DULCOLAX) 10 MG suppository Place 1 suppository (10 mg total) rectally daily as needed for  moderate constipation. Patient taking differently: Place 10 mg rectally daily as needed (constipation). 10/24/22  Yes Lanae Boast, MD  diclofenac Sodium (VOLTAREN) 1 % GEL Apply 2 g topically See admin instructions. "Apply to bilateral knees topically once time a day for pain. Apply 2 g to affected area 3 times daily" 07/20/21  Yes [provider]  docusate sodium (COLACE) 100 MG capsule Take 1 capsule (100 mg total) by mouth 2 (two) times daily. 04/18/23  Yes Vann, Jessica U, DO  escitalopram (LEXAPRO) 10 MG tablet Take 10 mg by mouth daily.   Yes [provider]  ferrous sulfate 325 (65 FE) MG tablet Take 1 tablet (325 mg total) by mouth daily with breakfast. 10/25/22  Yes Kc, Ramesh, MD  finasteride (PROSCAR) 5 MG tablet Take 5 mg by mouth daily.   Yes [provider]  folic acid (FOLVITE) 1 MG tablet Take 1 tablet (1 mg total) by mouth daily. 05/14/23  Yes Leroy Sea, MD  gabapentin (NEURONTIN) 300 MG capsule Take 1 capsule (300 mg total) by mouth at bedtime. 04/18/23  Yes Joseph Art, DO  hydrocortisone (CORTEF) 5 MG tablet Take 1 tablet (5 mg total) by mouth 2 (two) times daily. 04/18/23  Yes Vann, Jessica U, DO  insulin glargine-yfgn (SEMGLEE) 100 UNIT/ML injection Inject 0.2 mLs (20 Units total)  into the skin at bedtime. 04/18/23  Yes Vann, Jessica U, DO  melatonin 5 MG TABS Take 1 tablet (5 mg total) by mouth daily at 6 PM. Patient taking differently: Take 5 mg by mouth at bedtime. 10/24/22  Yes Lanae Boast, MD  midodrine (PROAMATINE) 10 MG tablet Take 1 tablet (10 mg total) by mouth with breakfast, with lunch, and with evening meal. 04/18/23  Yes Vann, Jessica U, DO  nystatin (MYCOSTATIN/NYSTOP) powder Apply topically 3 (three) times daily. Patient taking differently: Apply 1 Application topically 2 (two) times daily. 05/13/23  Yes Leroy Sea, MD  oxycodone (OXY-IR) 5 MG capsule Take 5 mg by mouth every 4 (four) hours as needed (moderate to severe pain).   Yes  [provider]  oxyCODONE (OXYCONTIN) 15 mg 12 hr tablet Take 15 mg by mouth every 12 (twelve) hours.   Yes [provider]  pantoprazole (PROTONIX) 40 MG tablet Take 1 tablet (40 mg total) by mouth daily. 06/30/22  Yes Pokhrel, Rebekah Chesterfield, MD  saccharomyces boulardii (FLORASTOR) 250 MG capsule Take 250 mg by mouth 2 (two) times daily.   Yes [provider]  senna-docusate (SENOKOT-S) 8.6-50 MG tablet Take 1 tablet by mouth 2 (two) times daily. 10/24/22  Yes Lanae Boast, MD  tamsulosin (FLOMAX) 0.4 MG CAPS capsule Take 0.4 mg by mouth daily. 07/29/22  Yes [provider]  fluconazole (DIFLUCAN) 100 MG tablet Take 100 mg by mouth daily. Patient not taking: Reported on 06/04/2023    [provider]  HUMALOG KWIKPEN 100 UNIT/ML KwikPen Inject 0-15 Units into the skin See admin instructions. Inject 0-15 units three times daily before meals per sliding scale: BS < 60 call MD 0 - 100 : 0 units 101-150 : 3 units 151-200 : 4 units 201-250 : 7 units 251-300 : 9 units 301-350 : 12 units 351-400 : 15 units  BS > 400 call MD 04/22/23   [provider]     Family History  Problem Relation Age of Onset   Diabetes Mother    Hypertension Mother    Heart disease Mother    Cancer Father    Heart attack Sister    Cancer Sister    Diabetes Brother     Social History   Socioeconomic History   Marital status: Single    Spouse name: Not on file   Number of children: Not on file   Years of education: Not on file   Highest education level: Not on file  Occupational History   Not on file  Tobacco Use   Smoking status: Never   Smokeless tobacco: Never  Substance and Sexual Activity   Alcohol use: Never   Drug use: Never   Sexual activity: Not on file  Other Topics Concern   Not on file  Social History Narrative   Not on file   Social Determinants of Health   Financial Resource Strain: Low Risk  (08/13/2018)   Received from [facility] visits prior to 12/21/2022., [facility] visits prior to 12/21/2022.   Overall Financial Resource Strain (CARDIA)    Difficulty of Paying Living Expenses: Not hard at all  Food Insecurity: No Food Insecurity (06/04/2023)   Hunger Vital Sign    Worried About Running Out of Food in the Last Year: Never true    Ran Out of Food in the Last Year: Never true  Transportation Needs: No Transportation Needs (06/04/2023)   PRAPARE - Transportation  Lack of Transportation (Medical): No    Lack of Transportation (Non-Medical): No  Physical Activity: Not on file  Stress: Not on file  Social Connections: Not on file     Review of Systems: A 12 point ROS discussed and pertinent positives are indicated in the HPI above.  All other systems are negative.  Review of Systems  Constitutional:  Negative for fever.  HENT:  Negative for congestion.   Respiratory:  Negative for cough and shortness of breath.   Cardiovascular:  Negative for chest pain.  Gastrointestinal:  Negative for abdominal pain.  Neurological:  Negative for headaches.  Psychiatric/Behavioral:  Negative for behavioral problems and confusion.     Vital Signs: BP 106/67 (BP Location: Left Arm)   Pulse 86   Temp 97.7 F (36.5 C) (Oral)   Resp 18   Ht 6' 2.02" (1.88 m)   Wt 252 lb 10.4 oz (114.6 kg)   SpO2 97%   BMI 32.42 kg/m   Advance Care Plan: The advanced care plan/surrogate decision maker was discussed at the time of visit and documented in the medical record. Michaelle Copas   Physical Exam Vitals and nursing note reviewed.  Constitutional:      Appearance: He is well-developed. He is ill-appearing.  HENT:     Head: Normocephalic.  Cardiovascular:     Rate and Rhythm: Normal rate and regular rhythm.     Heart sounds: Normal heart sounds.  Pulmonary:     Effort: Pulmonary effort is normal.     Breath sounds: Normal breath sounds.  Genitourinary:    Comments: Foley catheter in  place Musculoskeletal:        General: Normal range of motion.     Cervical back: Normal range of motion.  Skin:    General: Skin is warm and dry.  Neurological:     General: No focal deficit present.     Mental Status: He is alert and oriented to person, place, and time.  Psychiatric:        Mood and Affect: Mood normal.        Behavior: Behavior normal.        Thought Content: Thought content normal.        Judgment: Judgment normal.     Imaging: CT Cervical Spine Wo Contrast  Result Date: 06/04/2023 CLINICAL DATA:  Neck trauma. EXAM: CT CERVICAL SPINE WITHOUT CONTRAST TECHNIQUE: Multidetector CT imaging of the cervical spine was performed without intravenous contrast. Multiplanar CT image reconstructions were also generated. RADIATION DOSE REDUCTION: This exam was performed according to the departmental dose-optimization program which includes automated exposure control, adjustment of the mA and/or kV according to patient size and/or use of iterative reconstruction technique. COMPARISON:  None available FINDINGS: Alignment: No significant listhesis is present. Cervical lordosis is normal Skull base and vertebrae: Craniocervical junction is within normal limits. Prominent pannus formation is noted about the dens. Ample space is present at the craniocervical junction. The vertebral body heights are normal. No acute fractures are present. Soft tissues and spinal canal: No prevertebral fluid or swelling. No visible canal hematoma. Disc levels: Multilevel uncovertebral and facet hypertrophy is present. Upper chest: The lung apices are clear. The thoracic inlet is within normal limits. IMPRESSION: 1. No acute fracture or traumatic subluxation. 2. Multilevel degenerative changes of the cervical spine as described. Electronically Signed   By: Marin Roberts M.D.   On: 06/04/2023 08:59   CT ABDOMEN PELVIS W CONTRAST  Result Date: 06/04/2023 CLINICAL DATA:  History  of Fournier's gangrene  status post incision and drainage presenting after fall to left side EXAM: CT ABDOMEN AND PELVIS WITH CONTRAST TECHNIQUE: Multidetector CT imaging of the abdomen and pelvis was performed using the standard protocol following bolus administration of intravenous contrast. RADIATION DOSE REDUCTION: This exam was performed according to the departmental dose-optimization program which includes automated exposure control, adjustment of the mA and/or kV according to patient size and/or use of iterative reconstruction technique. CONTRAST:  75mL OMNIPAQUE IOHEXOL 350 MG/ML SOLN COMPARISON:  CT abdomen and pelvis dated 04/01/2023 FINDINGS: Lower chest: No focal consolidation or pulmonary nodule in the lung bases. Increased large bilateral pleural effusions. Partially imaged heart size is normal. Coronary artery calcifications. Hepatobiliary: No focal hepatic lesions. No intra or extrahepatic biliary ductal dilation. Cholelithiasis. Pancreas: No focal lesions or main ductal dilation. Spleen: Normal in size without focal abnormality. Adrenals/Urinary Tract: No adrenal nodules. Similar left hydroureteronephrosis to the level of the decompressed urinary bladder. New increased hyperattenuation of the distal left ureter (3:75). No right hydronephrosis. No suspicious renal masses or calculi. Urinary catheter in-situ. Stomach/Bowel: Normal appearance of the stomach. No bowel wall thickening. Mildly dilated rectum contains inspissated stool. Normal appendix. Vascular/Lymphatic: Aortic atherosclerosis. No enlarged abdominal or pelvic lymph nodes. Reproductive: Prostate is unremarkable. Other: Increased small volume ascites. No free air or fluid collection. Musculoskeletal: No acute or abnormal lytic or blastic osseous lesions. Diffuse body wall edema. Previously noted subcutaneous emphysema is no longer seen in the partially imaged pubic region. IMPRESSION: 1. Similar left hydroureteronephrosis to the level of the decompressed urinary  bladder. New increased hyperattenuation of the distal left ureter, which may represent blood products or mural thickening. Recommend consultation to Urology with cystoscopic evaluation as clinically indicated. 2. Increased large bilateral pleural effusions. Increased small volume ascites. 3. Mildly dilated rectum contains inspissated stool. 4. Previously noted subcutaneous emphysema is no longer seen in the partially imaged pubic region. 5. Aortic Atherosclerosis (ICD10-I70.0). Coronary artery calcifications. Assessment for potential risk factor modification, dietary therapy or pharmacologic therapy may be warranted, if clinically indicated. Electronically Signed   By: Agustin Cree M.D.   On: 06/04/2023 08:55   CT Head Wo Contrast  Result Date: 06/04/2023 CLINICAL DATA:  Head trauma. EXAM: CT HEAD WITHOUT CONTRAST TECHNIQUE: Contiguous axial images were obtained from the base of the skull through the vertex without intravenous contrast. RADIATION DOSE REDUCTION: This exam was performed according to the departmental dose-optimization program which includes automated exposure control, adjustment of the mA and/or kV according to patient size and/or use of iterative reconstruction technique. COMPARISON:  CT head without contrast 05/06/2023. MR head without and with contrast 05/06/2023. 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 Associated volume loss is noted. No acute infarct, hemorrhage, or mass lesion is present. The ventricles are of normal size. No significant extraaxial fluid collection is present. The brainstem and cerebellum are within normal limits. Midline structures are within normal limits. Vascular: Atherosclerotic calcifications are present within the cavernous internal carotid arteries bilaterally and at the dural margin of both vertebral arteries. No hyperdense vessel is present. Skull: Left  parietal scalp soft tissue swelling is present. No underlying fracture is present. Calvarium is intact. Sinuses/Orbits: The paranasal sinuses and mastoid air cells are clear. A right lens replacement is present. Globes and orbits are within normal limits. IMPRESSION: 1. Left parietal scalp soft tissue swelling without underlying fracture. 2. No acute intracranial abnormality or significant interval change.  3. 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 Electronically Signed   By: Marin Roberts M.D.   On: 06/04/2023 08:47   DG Pelvis Portable  Result Date: 06/04/2023 CLINICAL DATA:  Level 2 fall on blood thinners. EXAM: PORTABLE PELVIS 1 VIEWS COMPARISON:  Pelvis MRI 04/04/2023 FINDINGS: Limited by underpenetration and leftward rotation. No fracture, dislocation, or gross erosion. IMPRESSION: Limited study without acute fracture or hip dislocation. Electronically Signed   By: Tiburcio Pea M.D.   On: 06/04/2023 06:38   DG Chest Portable 1 View  Result Date: 06/04/2023 CLINICAL DATA:  Level 2 trauma.  Fall.  Infection. EXAM: PORTABLE CHEST 1 VIEW COMPARISON:  05/06/2023 FINDINGS: The cardio pericardial silhouette is enlarged. Patchy mid and lower lung predominant airspace disease seen bilaterally. Left pleural effusion noted. No acute bony abnormality. Degenerative changes evident left shoulder. IMPRESSION: Patchy mid and lower lung predominant airspace disease with left pleural effusion. Imaging features could be related to edema or infection. Electronically Signed   By: Kennith Center M.D.   On: 06/04/2023 06:32    Labs:  CBC: Recent Labs    06/04/23 0644 06/05/23 0121 06/07/23 0400 06/09/23 0500  WBC 9.3 6.8 6.8 8.7  HGB 11.3* 9.5* 10.2* 10.0*  HCT 38.1* 31.6* 32.7* 32.0*  PLT 197 155 144* 173    COAGS: Recent Labs    08/03/22 0400 08/31/22 1511 09/04/22 1221 10/03/22 0243 10/07/22 0240  06/04/23 0646  INR  --  1.7* 1.3* 1.5* 1.3* 1.7*  APTT 47* 37* 32  --   --  37*    BMP: Recent Labs    06/06/23 0311 06/07/23 0400 06/08/23 0205 06/09/23 0500  NA 133* 131* 131* 132*  K 3.6 4.0 3.6 3.5  CL 103 98 100 99  CO2 22 21* 24 23  GLUCOSE 199* 188* 185* 238*  BUN 17 20 29* 32*  CALCIUM 8.0* 8.0* 8.2* 8.3*  CREATININE 0.83 0.80 0.96 0.80  GFRNONAA >60 >60 >60 >60    LIVER FUNCTION TESTS: Recent Labs    05/06/23 0830 05/07/23 0625 06/04/23 0646 06/05/23 0121  BILITOT 0.8 0.8 1.1 1.0  AST 25 26 19 16   ALT 16 16 12 11   ALKPHOS 82 85 85 68  PROT 5.4* 5.4* 6.3* 5.0*  ALBUMIN 2.3* 2.1* 3.0* 2.5*     Assessment and Plan:  67 y.o. male inpatient. History of DM, TIA, a fib, bowel peroration, HTN, HLD, CAD, MI s/p PCI, BPH. Presented to the ED on 8.14.24 due to injuries related to a fall on anticoagulation. Recent diagnosis of Fornier's gangrene s/p debridement requiring hospitalization two times in  June 2024. Found to have wound dehiscence. 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  Team is requesting a suprapubic catheter for urinary diversion.   CT abd pelvis from 8.14.24.  All labs and mediations are within acceptable parameters. Patient has been NPO since midnight.  Risks and benefits discussed with the patient including bleeding, infection, damage to adjacent structures, bowel perforation/fistula connection, and sepsis.  All of the patient's questions were answered, patient is agreeable to proceed. Consent signed and in chart.   Thank you for this interesting consult.  I greatly enjoyed meeting Kirolos Ray and look forward to participating in their care.  A copy of this report was sent to the requesting provider on this date.  Electronically Signed: Alene Mires,  NP 06/09/2023, 7:40 AM   I spent a total of 40 Minutes    in face to face in clinical  consultation, greater than 50% of which was counseling/coordinating care for suprapubic catheter placement

## 2023-06-09 NOTE — Progress Notes (Signed)
[facility] PROGRESS NOTE  Peter Yang (DOB: [birth year]) GNF:[id number] PCP: Patient, No Pcp Per Outpatient Specialists: [facility], Dr. Drue Second  Brief Narrative: Peter Yang is a 67 y.o. male with a history of T2DM with polyneuropathy, DFU s/p left BKA, CAD s/p stent, HLD, PAF, HFrEF, adrenal insufficiency, CVA, BPH with chronic foley, and recent surgeries for Fournier's gangrene June 2024 having completed IV antibiotics 7/30 who presented to the ED on 06/04/2023 after falling out of bed at [facility]. He was afebrile with dehiscence of left scrotal wound with discharge. Work up showed WBC 9.3k, ESR 4, PCT <0.10, CRP 2.9 (down from 20.7 on 8/7), lactic acid 2.1 > 1.8. IV antibiotics were given and urology consulted. Urine culture grew MDR Pseudomonas, wound culture grew MDR Klebsiella and Pseudomonas for which ID was consulted, started ceftolozane-tazobactam. 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 Suprapubic catheter placed 8/19.   Subjective: Got suprapubic catheter today. After procedure he's hungry, requesting pain medication.   Objective: BP (!) 128/90 (BP Location: Left Arm)   Pulse 96   Temp 97.7 F (36.5 C) (Oral)   Resp 11   Ht 6' 2.02" (1.88 m)   Wt 114.6 kg   SpO2 95%   BMI 32.42 kg/m   Gen: No distress Pulm: Clear, nonlabored  CV: Irreg with rate in 90's, no MRG, stable pitting dependent and diffuse edema. GI: Soft, NT, ND, +BS Neuro: Alert and oriented. No new focal deficits. Ext: Warm, dry, left BKA Skin: Scrotum just examined by urology prior to my entering room. Suprapubic cath site is c/d/I with bloody urine output.   Assessment & Plan: Anasarca, acute on chronic combined HFrEF, bilateral pleural effusions: Diffuse body edema also attributable to hypoalbuminemia/malnutrition.  - Respiratory status remains stable. Will monitor effusions while diuresing.  - Continue lasix 40mg  IV BID, had 4L UOP again yesterday. SCr  stable. Will aim to maintain net negative balance. BP is tolerating.  - RD consulted, supplementing protein  Scrotal surgical wound infection s/p debridement x2 for Fournier's gangrene:  - Continue MRSA coverage with vancomycin and Pseudomonal coverage based on MDR cultures with ceftolozane-tazobactam per ID.  - We've held eliquis, made NPO, pt going for surgery this morning. Blood cultures NGTD. - Wound care deferred to urology  Vitamin A deficiency:  - Supplementing - Also empirically supplement zinc and vitamin C to aid wound healing.  CAUTI vs. asymptomatic bacteriuria: - Suprapubic catheter now placed by IR 8/18.  - Urine culture with Pseudomonas. Culture with multidrug resistance but without urinary symptoms, no directed treatment for this per ID.  Hematuria: with normocytic anemia.  - Holding eliquis pending operative plans, may need reexploration in 24-48 hrs.  - Monitor CBC, hgb stable at 10g/dl.   Right foot wound: No exudate or odor at this time.  - Continue regular podiatry follow up, optimize nutritional status and glycemic control.  - Keep covered, continue daily wound care per WOC.  - Offload with prevalon boot  Stage 2 sacral pressure injury: POA:  - Offload, foam dressing w/medihoney.   IDT2DM: Last HbA1c 7% shows adequate glycemic control to assist wound healing.  - Continue glargine and SSI. At inpatient goal.  PAF: In atrial fibrillation with CVR.   - Restart eliquis postop, timing deferred to urology (op note mentions possibility of return to OR, so not restarting yet), not on rate-control agent at baseline.  Tinea cruris:  - Continue nystatin powder.   Chronic pain:  - No changes  Hypokalemia:  - Supplement again given his ongoing loop diuresis. Monitor daily  Hypomagnesemia:  - Supplemented, recheck in AM.  Chronic blood loss anemia: Above previous baseline, even after hemodilution. No active bleeding noted.  - Monitor intermittently.  - Continue  iron supplement with bowel regimen  Adrenal insufficiency:  - Continue home dose cortef, continue baseline dose midodrine. Low threshold for stress dose.  Hydronephrosis, BPH:  - Renal function stable, defer management to urology. - Continue tamsulosin, finasteride  Fall out of bed: Negative CT head, cervical spine. - Fall precautions  HLD:  - Continue statin  Depression:  - Continue escitalopram  [name], MD [facility] [email or web address] 06/09/2023, 11:32 AM

## 2023-06-09 NOTE — Progress Notes (Signed)
   1 Day Post-Op Subjective: Pt more alert and engaged than I have seen him. 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 There was initially a pinhole sinus draining at the bottom of the right side suture.  Would culture (+) Kleb,Pseud. Broadly covered.. UC (+) Pseudomonas Taken back to OR by Dr. Annabell Howells for debridement 8/18 Signs of urethral fistula. SP Tube placed in IR 8/19 Possible back to OR for 2nd look and repacking in a couple of days.    #hydronephrosis Chronic in nature.  This has been noted on multiple CT images after adequate time provided for urinary decompression. Preserved renal function    Intake/Output from previous day: 08/18 0701 - 08/19 0700 In: 1170 [P.O.:720; I.V.:200; IV Piggyback:250] Out: 3000 [Urine:3000]  Intake/Output this shift: Total I/O In: -  Out: 500 [Urine:500]  Physical Exam:  General: Alert and oriented CV: No cyanosis Lungs: equal chest rise Abdomen: Soft, NTND, no rebound or guarding Skin: bilateral scrotal incisions draining purulent looking fluid L>R Gu: Foley in place, clear yellow urine  Lab Results: Recent Labs    06/07/23 0400 06/09/23 0500  HGB 10.2* 10.0*  HCT 32.7* 32.0*   BMET Recent Labs    06/08/23 0205 06/09/23 0500  NA 131* 132*  K 3.6 3.5  CL 100 99  CO2 24 23  GLUCOSE 185* 238*  BUN 29* 32*  CREATININE 0.96 0.80  CALCIUM 8.2* 8.3*     Studies/Results: No results found.    LOS: 5 days   Elmon Kirschner, NP [facility] Pager: [phone]  06/09/2023, 12:45 PM

## 2023-06-09 NOTE — Plan of Care (Signed)
  Problem: Clinical Measurements: Goal: Diagnostic test results will improve Outcome: Progressing   Problem: Nutrition: Goal: Adequate nutrition will be maintained Outcome: Progressing   Problem: Elimination: Goal: Will not experience complications related to bowel motility Outcome: Progressing   Problem: Elimination: Goal: Will not experience complications related to urinary retention Outcome: Progressing
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
## 2023-06-09 NOTE — Progress Notes (Signed)
Attempt to call Michaelle Copas twice no answer both times. This nurse left a voicemail to call back. Informed pt that I have attempt twice and response by "she takes care of her grandkids during the day and she may call back later tonight". Will pass on in report that I've attempted.

## 2023-06-09 NOTE — Progress Notes (Signed)
Pharmacy Antibiotic Note  Peter Yang is a 67 y.o. male admitted on 06/04/2023 with wound infection.  Pharmacy has been consulted for vancomycin and Zerbaxa dosing.  Vanc peak 40, vanc trough 29 >> AUC 845, above goal.  Plan: Change vancomycin to 1250mg  IV Q24H for expected AUC 425. Continue Zerbaxa. Additional sensitivities still IP.  Height: 6' 2.02" (188 cm) Weight: 114.6 kg (252 lb 10.4 oz) IBW/kg (Calculated) : 82.24  Temp (24hrs), Avg:97.6 F (36.4 C), Min:97.1 F (36.2 C), Max:97.9 F (36.6 C)  Recent Labs  Lab 06/04/23 0644 06/04/23 0646 06/04/23 0650 06/04/23 0950 06/05/23 0121 06/06/23 0311 06/07/23 0400 06/08/23 0205 06/08/23 1918 06/09/23 0500  WBC 9.3  --   --   --  6.8  --  6.8  --   --  8.7  CREATININE  --    < >  --   --  0.96 0.83 0.80 0.96  --  0.80  LATICACIDVEN  --   --  2.1* 1.8  --   --   --   --   --   --   VANCOTROUGH  --   --   --   --   --   --   --   --   --  29*  VANCOPEAK  --   --   --   --   --   --   --   --  40  --    < > = values in this interval not displayed.    Estimated Creatinine Clearance: 122.3 mL/min (by C-G formula based on SCr of 0.8 mg/dL).    Allergies  Allergen Reactions   Sglt2 Inhibitors Other (See Comments)    Fournier's gangrene    Thank you for allowing pharmacy to be a part of this patient's care.  Vernard Gambles, PharmD, BCPS  06/09/2023 6:22 AM

## 2023-06-09 NOTE — Progress Notes (Signed)
[facility]  Date of Admission:  06/04/2023   Total days of inpatient antibiotics 5  Principal Problem:   Wound dehiscence in puerperium, perineal Active Problems:   PAF (paroxysmal atrial fibrillation) (HCC)   Hematuria   Pleural effusion, bilateral   Type 2 diabetes mellitus with diabetic peripheral angiopathy without gangrene, without long-term current use of insulin (HCC)   UTI (urinary tract infection) due to urinary indwelling catheter (HCC)   Soft tissue infection   Malnutrition of moderate degree   History of MDR Pseudomonas aeruginosa infection          Assessment: 67 year old male with history of forearm secondary status post I&D on 6/12, 03/1919 24 status post ertapenem/Dapto x 6 weeks EOT 7/30 admitted with 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  Cefepime was switched to Zyprexa-patient underwent bilateral scrotal aspiration and cystoscopy on 8/18 with urology - Noted urethral fistula, consulted IR for suprapubic to divert urine from the urethra Recommendations: -Follow-up PsA sens for Zyrbaxa, avycaz, cefiderocol - Continue Zerbaxa - Discontinue vancomycin - Follow urology plans Microbiology:   Antibiotics: Zerbaxa 8/19-present Cefepime 8/15-17 Vancomycin 8/14-present  Resting in bed.  No new complaints. Interval: Afebrile overnight.  Review of Systems: Review of Systems  All other systems reviewed and are negative.    Scheduled Meds:  acetaminophen  1,000 mg Oral Q8H   ascorbic acid  500 mg Oral Daily   atorvastatin  40 mg Oral QPM   Chlorhexidine Gluconate Cloth  6 each Topical Q0600   escitalopram  10 mg Oral Daily   feeding supplement  237  mL Oral BID BM   ferrous sulfate  325 mg Oral Q breakfast   finasteride  5 mg Oral Daily   furosemide  40 mg Intravenous BID   gabapentin  200 mg Oral BID   hydrocortisone  5 mg Oral BID   insulin aspart  0-6 Units Subcutaneous TID WC   insulin glargine-yfgn  20 Units Subcutaneous QHS   leptospermum manuka honey  1 Application Topical Daily   midodrine  10 mg Oral TID with meals   multivitamin with minerals  1 tablet Oral Daily   mupirocin ointment  1 Application Nasal BID   nutrition supplement (JUVEN)  1 packet Oral BID BM   nystatin   Topical BID   oxyCODONE  15 mg Oral Q12H   pantoprazole  40 mg Oral Daily   saccharomyces boulardii  250 mg Oral BID   senna-docusate  1 tablet Oral BID   tamsulosin  0.4 mg Oral Daily   vitamin A  10,000 Units Oral Daily   zinc sulfate  220 mg Oral Daily   Continuous Infusions:  ceftolozane-tazobactam (ZERBAXA) 3 g in sodium chloride 0.9 % 100 mL IVPB     PRN Meds:.acetaminophen **OR** acetaminophen, albuterol, oxyCODONE Allergies  Allergen Reactions   Sglt2 Inhibitors Other (See Comments)    Fournier's gangrene    OBJECTIVE: Vitals:   06/09/23 1015 06/09/23 1020 06/09/23 1025 06/09/23 1030  BP: (!) 133/106 (!) 138/98 (!) 116/101 (!) 128/90  Pulse: (!) 108 98 95 96  Resp: 12 16 12 11   Temp:      TempSrc:      SpO2: 93% 96% 95% 95%  Weight:  Height:       Body mass index is 32.42 kg/m.  Physical Exam Constitutional:      General: He is not in acute distress.    Appearance: He is normal weight. He is not toxic-appearing.  HENT:     Head: Normocephalic and atraumatic.     Right Ear: External ear normal.     Left Ear: External ear normal.     Nose: No congestion or rhinorrhea.     Mouth/Throat:     Mouth: Mucous membranes are moist.     Pharynx: Oropharynx is clear.  Eyes:     Extraocular Movements: Extraocular movements intact.     Conjunctiva/sclera: Conjunctivae normal.     Pupils: Pupils are equal, round, and  reactive to light.  Cardiovascular:     Rate and Rhythm: Normal rate and regular rhythm.     Heart sounds: No murmur heard.    No friction rub. No gallop.  Pulmonary:     Effort: Pulmonary effort is normal.     Breath sounds: Normal breath sounds.  Abdominal:     General: Abdomen is flat. Bowel sounds are normal.     Palpations: Abdomen is soft.  Genitourinary:    Comments: Groin wounds. Musculoskeletal:        General: No swelling. Normal range of motion.     Cervical back: Normal range of motion and neck supple.  Skin:    General: Skin is warm and dry.  Neurological:     General: No focal deficit present.     Mental Status: He is oriented to person, place, and time.  Psychiatric:        Mood and Affect: Mood normal.       Lab Results Lab Results  Component Value Date   WBC 8.7 06/09/2023   HGB 10.0 (L) 06/09/2023   HCT 32.0 (L) 06/09/2023   MCV 87.4 06/09/2023   PLT 173 06/09/2023    Lab Results  Component Value Date   CREATININE 0.80 06/09/2023   BUN 32 (H) 06/09/2023   NA 132 (L) 06/09/2023   K 3.5 06/09/2023   CL 99 06/09/2023   CO2 23 06/09/2023    Lab Results  Component Value Date   ALT 11 06/05/2023   AST 16 06/05/2023   ALKPHOS 68 06/05/2023   BILITOT 1.0 06/05/2023        Danelle Earthly, MD [facility] 06/09/2023, 12:52 PM   I have personally spent 55 minutes involved in face-to-face and non-face-to-face activities for this patient on the day of the visit. Professional time spent includes the following activities: Preparing to see the patient (review of tests), Obtaining and/or reviewing separately obtained history (admission/discharge record), Performing a medically appropriate examination and/or evaluation , Ordering medications/tests/procedures, referring and communicating with other health care professionals, Documenting clinical information in the EMR, Independently interpreting results  (not separately reported), Communicating results to the patient/family/caregiver, Counseling and educating the patient/family/caregiver and Care coordination (not separately reported).

## 2023-06-10 DIAGNOSIS — Z8619 Personal history of other infectious and parasitic diseases: Secondary | ICD-10-CM | POA: Diagnosis not present

## 2023-06-10 DIAGNOSIS — L089 Local infection of the skin and subcutaneous tissue, unspecified: Secondary | ICD-10-CM | POA: Diagnosis not present

## 2023-06-10 LAB — GLUCOSE, CAPILLARY
Glucose-Capillary: 185 mg/dL — ABNORMAL HIGH (ref 70–99)
Glucose-Capillary: 164 mg/dL — ABNORMAL HIGH (ref 70–99)
Glucose-Capillary: 119 mg/dL — ABNORMAL HIGH (ref 70–99)
Glucose-Capillary: 185 mg/dL — ABNORMAL HIGH (ref 70–99)

## 2023-06-10 LAB — BASIC METABOLIC PANEL
Anion gap: 7 (ref 5–15)
BUN: 40 mg/dL — ABNORMAL HIGH (ref 8–23)
CO2: 25 mmol/L (ref 22–32)
Calcium: 8.2 mg/dL — ABNORMAL LOW (ref 8.9–10.3)
Chloride: 101 mmol/L (ref 98–111)
Creatinine, Ser: 0.82 mg/dL (ref 0.61–1.24)
GFR, Estimated: >60 mL/min (ref >60)
Glucose, Bld: 137 mg/dL — ABNORMAL HIGH (ref 70–99)
Potassium: 3.5 mmol/L (ref 3.5–5.1)
Sodium: 133 mmol/L — ABNORMAL LOW (ref 135–145)

## 2023-06-10 LAB — CBC
HCT: 29.9 % — ABNORMAL LOW (ref 39.0–52.0)
Hemoglobin: 9.1 g/dL — ABNORMAL LOW (ref 13.0–17.0)
MCH: 26.1 pg (ref 26.0–34.0)
MCHC: 30.4 g/dL (ref 30.0–36.0)
MCV: 85.9 fL (ref 80.0–100.0)
Platelets: 161 10*3/uL (ref 150–400)
RBC: 3.48 MIL/uL — ABNORMAL LOW (ref 4.22–5.81)
RDW: 23.9 % — ABNORMAL HIGH (ref 11.5–15.5)
WBC: 9.5 10*3/uL (ref 4.0–10.5)
nRBC: 0 % (ref 0.0–0.2)

## 2023-06-10 LAB — MAGNESIUM: Magnesium: 1.8 mg/dL (ref 1.7–2.4)

## 2023-06-10 MED ORDER — CHLORHEXIDINE GLUCONATE CLOTH 2 % EX PADS
6.0000 | MEDICATED_PAD | Freq: Every day | CUTANEOUS | Status: DC
  Administered 2023-06-10 – 2023-06-19 (×10): 6 via TOPICAL

## 2023-06-10 MED ORDER — HYDROMORPHONE HCL 1 MG/ML IJ SOLN
0.5000 mg | Freq: Once | INTRAMUSCULAR | Status: AC
  Administered 2023-06-10: 0.5 mg via INTRAVENOUS
  Filled 2023-06-10: qty 0.5

## 2023-06-10 MED ORDER — SODIUM CHLORIDE 0.9% FLUSH
5.0000 mL | Freq: Three times a day (TID) | INTRAVENOUS | Status: DC
  Administered 2023-06-10 – 2023-06-19 (×27): 5 mL

## 2023-06-10 MED ORDER — OXYCODONE HCL 5 MG PO TABS
5.0000 mg | ORAL_TABLET | ORAL | Status: DC | PRN
  Administered 2023-06-10 – 2023-06-15 (×12): 10 mg via ORAL
  Filled 2023-06-10 (×12): qty 2

## 2023-06-10 NOTE — Progress Notes (Signed)
[facility] PROGRESS NOTE  Peter Yang (DOB: [birth year]) ZOX:[id number] PCP: Patient, No Pcp Per Outpatient Specialists: [facility], Dr. Drue Second  Brief Narrative: Peter Yang is a 67 y.o. male with a history of T2DM with polyneuropathy, DFU s/p left BKA, CAD s/p stent, HLD, PAF, HFrEF, adrenal insufficiency, CVA, BPH with chronic foley, and recent surgeries for Fournier's gangrene June 2024 having completed IV antibiotics 7/30 who presented to the ED on 06/04/2023 after falling out of bed at [facility]. He was afebrile with dehiscence of left scrotal wound with discharge. Work up showed WBC 9.3k, ESR 4, PCT <0.10, CRP 2.9 (down from 20.7 on 8/7), lactic acid 2.1 > 1.8. IV antibiotics were given and urology consulted. Urine culture grew MDR Pseudomonas, wound culture grew MDR Klebsiella and Pseudomonas for which ID was consulted, started ceftolozane-tazobactam. 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 Suprapubic catheter placed 8/19.   Subjective: Pain in the crotch per pt. It is severe and not very well taken care of even when he gets the oxycodone. No fevers or other complaints.   Objective: BP 114/73 (BP Location: Left Arm)   Pulse 80   Temp (!) 97.5 F (36.4 C) (Oral)   Resp 18   Ht 6' 2.02" (1.88 m)   Wt 111.7 kg   SpO2 97%   BMI 31.60 kg/m   Gen: No distress, chronically ill-appearing Pulm: Clear, nonlabored  CV: RRR, no MRG. No JVD, diffuse pitting edema is overall improved, maybe 1+ now.  GI: Soft, NT, ND, +BS. Neuro: Alert and oriented. Ext: Warm, dry left BKA Skin: R midfoot wound dressing c/d/I, nontender. Suprapubic catheter site c/d/I. Foley still in urethra, clamped with some bloody sediment. Scrotum wounds open with gauze packing, no exudate.   Assessment & Plan: Anasarca, acute on chronic combined HFrEF, bilateral pleural effusions: Diffuse body edema also attributable to hypoalbuminemia/malnutrition.  - Respiratory  status remains stable. Will monitor effusions while diuresing.  - Continue lasix 40mg  IV BID, had ~3.8L UOP in past 24 hours. BUN is climbing, but Cr steady. Will aim to maintain net negative balance. BP is tolerating. May deescalate in next 24-48 hours. - RD consulted, supplementing protein  Scrotal surgical wound infection s/p debridement x2 for Fournier's gangrene:  - Continue Pseudomonal coverage based on MDR cultures with ceftolozane-tazobactam per ID. Blood cultures NGTD. - We've held eliquis, will continue to hold until reopening wound plans/timing are decided per urology.  - Wound care deferred to urology - augment oxycodone dosing from 5mg  prn to 5-10mg  prn. With his chronic opioid use, tolerance is likely developing.  Vitamin A deficiency:  - Supplementing - Also empirically supplement zinc and vitamin C to aid wound healing.  CAUTI vs. asymptomatic bacteriuria: - Suprapubic catheter now placed by IR 8/18.  - Urine culture with Pseudomonas. Culture with multidrug resistance but without urinary symptoms, no directed treatment for this per ID.  Hematuria: with normocytic anemia.  - Holding eliquis pending operative plans, may need reexploration in 24-48 hrs.  - Monitor CBC, hgb stable at 10g/dl.   Right foot wound: No exudate or odor at this time.  - Continue regular podiatry follow up, optimize nutritional status and glycemic control.  - Keep covered, continue daily wound care per WOC.  - Offload with prevalon boot  Stage 2 sacral pressure injury: POA:  - Offload, foam dressing w/medihoney.   IDT2DM: Last HbA1c 7% shows adequate glycemic control to assist wound healing.  - Continue glargine and SSI. At  inpatient goal.  PAF: In atrial fibrillation with CVR.   - Restart eliquis postop, timing deferred to urology (op note mentions possibility of return to OR, so not restarting yet), not on rate-control agent at baseline.  Tinea cruris:  - Continue nystatin powder.    Chronic pain:  - No changes  Hypokalemia:  - Supplement again given his ongoing loop diuresis. Monitor daily  Hypomagnesemia:  - Supplemented, recheck in AM.  Chronic blood loss anemia: Above previous baseline, even after hemodilution. No active bleeding noted.  - Monitor intermittently.  - Continue iron supplement with bowel regimen  Adrenal insufficiency:  - Continue home dose cortef, continue baseline dose midodrine. Low threshold for stress dose.  Hydronephrosis, BPH:  - Renal function stable, defer management to urology. - Continue tamsulosin, finasteride  Fall out of bed: Negative CT head, cervical spine. - Fall precautions  HLD:  - Continue statin  Depression:  - Continue escitalopram  [name], MD [facility] [email or web address] 06/10/2023, 9:45 AM

## 2023-06-10 NOTE — Plan of Care (Signed)
  Problem: Education: Goal: Knowledge of General Education information will improve Description: Including pain rating scale, medication(s)/side effects and non-pharmacologic comfort measures Outcome: Progressing   Problem: Cardiac: Goal: Ability to achieve and maintain adequate cardiopulmonary perfusion will improve Outcome: Progressing   

## 2023-06-10 NOTE — Progress Notes (Signed)
2 Days Post-Op Subjective: NAEON. Alert and oriented.   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 There was initially a pinhole sinus draining at the bottom of the right side suture.  Would culture (+) Kleb,Pseud. Broadly covered.. UC (+) Pseudomonas Taken back to OR by Dr. Annabell Howells for debridement 8/18 Signs of urethral fistula. SP Tube placed in IR 8/19. Exchange and dilate at 6 weeks with IR.  Wound care completed at bedside. Can't manually reach wound base on left side. Around 7-8cm deep toward pubis and inguinal canal.    #hydronephrosis Chronic in nature.  This has been noted on multiple CT images after adequate time provided for urinary decompression. Preserved renal function    Intake/Output from previous day: 08/19 0701 - 08/20 0700 In: 363 [P.O.:240; IV Piggyback:123] Out: 4225 [Urine:4225]  Intake/Output this shift: Total I/O In: 360 [P.O.:360] Out: 1300 [Urine:1300]  Physical Exam:  General: Alert and oriented CV: No cyanosis Lungs: equal chest rise Abdomen: Soft, NTND, no rebound or guarding Skin: bilateral scrotal incisions. Red beefy pink wound beds. No purulent drainage. 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 Suprapubic bladder drainage catheter requested for urinary diversion. EXAM: CT GUIDE TUBE PLACEMENT COMPARISON:  None Available. MEDICATIONS: None ANESTHESIA/SEDATION: None Moderate (conscious) sedation was employed during this procedure. A total of Versed 3.0 mg and Fentanyl 200 mcg was administered intravenously by radiology nursing. Moderate Sedation Time: 36 minutes. [name] level of consciousness and vital signs were monitored continuously by radiology nursing throughout the procedure under my direct supervision. CONTRAST:  None FLUOROSCOPY TIME:  CT guidance utilized. COMPLICATIONS: None immediate. PROCEDURE: Informed written consent was obtained from the patient after a thorough discussion of the procedural risks, benefits and alternatives. All questions were addressed. Maximal Sterile Barrier Technique was utilized including caps, mask, sterile gowns, sterile gloves, sterile drape, hand hygiene and skin antiseptic. A timeout was performed prior to the initiation of the procedure. Via an indwelling Foley catheter, the bladder was gently distended with sterile saline by gravity bag infusion. CT of the pelvis was performed in a supine position. Under CT guidance, an 18 gauge trocar needle was advanced from a midline suprapubic approach into the bladder lumen. The needle was removed over a guidewire. The percutaneous tract was dilated over the wire and a 14 French pigtail drainage catheter advanced into the bladder. Catheter position was confirmed by CT. The catheter was flushed with saline and connected to a gravity drainage bag. It was secured at the skin exit site with a Prolene retention suture and adhesive StatLock device. FINDINGS: After puncture of the bladder and placement of a  suprapubic drainage catheter, there is return grossly bloody urine. IMPRESSION: CT-guided placement of 14 French suprapubic bladder drainage catheter. Electronically Signed    By: Irish Lack M.D.   On: 06/09/2023 14:20      LOS: 6 days   Elmon Kirschner, NP [facility] Pager: [phone]  06/10/2023, 2:46 PM

## 2023-06-11 ENCOUNTER — Inpatient Hospital Stay (HOSPITAL_COMMUNITY): Payer: Medicare Other

## 2023-06-11 DIAGNOSIS — I480 Paroxysmal atrial fibrillation: Secondary | ICD-10-CM

## 2023-06-11 DIAGNOSIS — R310 Gross hematuria: Secondary | ICD-10-CM

## 2023-06-11 DIAGNOSIS — E440 Moderate protein-calorie malnutrition: Secondary | ICD-10-CM

## 2023-06-11 DIAGNOSIS — J90 Pleural effusion, not elsewhere classified: Secondary | ICD-10-CM | POA: Diagnosis not present

## 2023-06-11 DIAGNOSIS — E1151 Type 2 diabetes mellitus with diabetic peripheral angiopathy without gangrene: Secondary | ICD-10-CM

## 2023-06-11 LAB — CBC
HCT: 35.6 % — ABNORMAL LOW (ref 39.0–52.0)
Hemoglobin: 10.7 g/dL — ABNORMAL LOW (ref 13.0–17.0)
MCH: 26.2 pg (ref 26.0–34.0)
MCHC: 30.1 g/dL (ref 30.0–36.0)
MCV: 87.3 fL (ref 80.0–100.0)
Platelets: 135 10*3/uL — ABNORMAL LOW (ref 150–400)
RBC: 4.08 MIL/uL — ABNORMAL LOW (ref 4.22–5.81)
RDW: 23.9 % — ABNORMAL HIGH (ref 11.5–15.5)
WBC: 9.3 10*3/uL (ref 4.0–10.5)
nRBC: 0 % (ref 0.0–0.2)

## 2023-06-11 LAB — BASIC METABOLIC PANEL
Anion gap: 11 (ref 5–15)
BUN: 37 mg/dL — ABNORMAL HIGH (ref 8–23)
CO2: 25 mmol/L (ref 22–32)
Calcium: 8.6 mg/dL — ABNORMAL LOW (ref 8.9–10.3)
Chloride: 97 mmol/L — ABNORMAL LOW (ref 98–111)
Creatinine, Ser: 0.84 mg/dL (ref 0.61–1.24)
GFR, Estimated: >60 mL/min (ref >60)
Glucose, Bld: 212 mg/dL — ABNORMAL HIGH (ref 70–99)
Potassium: 3.5 mmol/L (ref 3.5–5.1)
Sodium: 133 mmol/L — ABNORMAL LOW (ref 135–145)

## 2023-06-11 LAB — GLUCOSE, CAPILLARY
Glucose-Capillary: 227 mg/dL — ABNORMAL HIGH (ref 70–99)
Glucose-Capillary: 204 mg/dL — ABNORMAL HIGH (ref 70–99)
Glucose-Capillary: 175 mg/dL — ABNORMAL HIGH (ref 70–99)
Glucose-Capillary: 237 mg/dL — ABNORMAL HIGH (ref 70–99)

## 2023-06-11 LAB — MINIMUM INHIBITORY CONC. (2 DRUGS)

## 2023-06-11 LAB — MIC RESULTS (2 DRUGS)

## 2023-06-11 MED ORDER — INSULIN GLARGINE-YFGN 100 UNIT/ML ~~LOC~~ SOLN
24.0000 [IU] | Freq: Every day | SUBCUTANEOUS | Status: DC
  Administered 2023-06-11 – 2023-06-18 (×7): 24 [IU] via SUBCUTANEOUS
  Filled 2023-06-11 (×9): qty 0.24

## 2023-06-11 MED ORDER — DEXTROSE 5 % IV SOLN
2.5000 g | Freq: Three times a day (TID) | INTRAVENOUS | Status: DC
  Administered 2023-06-11 – 2023-06-19 (×24): 2.5 g via INTRAVENOUS
  Filled 2023-06-11 (×26): qty 12

## 2023-06-11 MED ORDER — HYDROMORPHONE HCL 1 MG/ML IJ SOLN
0.5000 mg | INTRAMUSCULAR | Status: DC | PRN
  Administered 2023-06-11 – 2023-06-15 (×6): 0.5 mg via INTRAVENOUS
  Filled 2023-06-11 (×6): qty 0.5

## 2023-06-11 NOTE — Progress Notes (Signed)
Patient complains of significant groin pain.  Received scheduled Oxycodone not time for PRN yet.  Given Tylenol. He still reports feeling short of breath.  His lung sounds are clear, mildly decreased bases.   He received his scheduled morning lasix and has voided 1500cc since. PCXR done IS 1750,  continue to encourage RN to call if patient becomes labored or requires additional O2

## 2023-06-11 NOTE — Progress Notes (Signed)
Pharmacy Antibiotic Note  Peter Yang is a 67 y.o. male admitted on 06/04/2023 with scrotal wound infection/fournier's gangrene with tissue/wound cultures growing MDR Psuedomonas - added sensitivities show susceptibility to Avycaz which will cover both the PsA and Kleb PNA isolated in cultures. Pharmacy has been consulted for Avycaz dosing.  Plan: - Start Ceftazidime/avibactam (Avycaz) 2.5g IV every 8 hours - Will continue to follow renal function, culture results, LOT, and antibiotic de-escalation plans   Height: 6' 2.02" (188 cm) Weight: 111.7 kg (246 lb 4.1 oz) IBW/kg (Calculated) : 82.24  Temp (24hrs), Avg:98.4 F (36.9 C), Min:98 F (36.7 C), Max:98.6 F (37 C)  Recent Labs  Lab 06/05/23 0121 06/06/23 0311 06/07/23 0400 06/08/23 0205 06/08/23 1918 06/09/23 0500 06/09/23 2352 06/11/23 0054  WBC 6.8  --  6.8  --   --  8.7 9.5 9.3  CREATININE 0.96   < > 0.80 0.96  --  0.80 0.82 0.84  VANCOTROUGH  --   --   --   --   --  29*  --   --   VANCOPEAK  --   --   --   --  40  --   --   --    < > = values in this interval not displayed.    Estimated Creatinine Clearance: 115 mL/min (by C-G formula based on SCr of 0.84 mg/dL).    Allergies  Allergen Reactions   Sglt2 Inhibitors Other (See Comments)    Fournier's gangrene    Antimicrobials this admission: Zosyn 8/14 x 1 Vancomycin 8/15 >> 8/18 Cefepime 8/15 >> 8/17 Ceftolozane/tazobactam (Zerbaxa) 8/17 >> 8/21 Ceftazidime/avibactam (Avycaz) 8/21 >>   Microbiology results: 8/15 MRSA PCR >> positive 8/14 UCx >> 100k PsA (I-Ceftaz/Cipro, R Imi, S Gent) 8/14 BCx >> NGf 8/14 WCx (scrotum) >> PsA + Kleb PNA  Thank you for allowing pharmacy to be a part of this patient's care.  Georgina Pillion, PharmD, BCPS, BCIDP Infectious Diseases Clinical Pharmacist 06/11/2023 1:33 PM   **Pharmacist phone directory can now be found on [email or web address] ([email or web address]).  Listed under [facility].

## 2023-06-11 NOTE — Progress Notes (Signed)
[facility]                                                                               Ellsworth Charlebois, is a 67 y.o. male, DOB - [birth year], ZOX:[id number] Admit date - 06/04/2023    Outpatient Primary MD for the patient is Patient, No Pcp Per  LOS - 7  days    Brief summary    67 y.o. male with a history of T2DM with polyneuropathy, DFU s/p left BKA, CAD s/p stent, HLD, PAF, HFrEF, adrenal insufficiency, CVA, BPH with chronic foley, and recent surgeries for Fournier's gangrene June 2024 having completed IV antibiotics 7/30 who presented to the ED on 06/04/2023 after falling out of bed at [facility]. He was afebrile with dehiscence of left scrotal wound with discharge. Work up showed WBC 9.3k, ESR 4, PCT <0.10, CRP 2.9 (down from 20.7 on 8/7), lactic acid 2.1 > 1.8. IV antibiotics were given and urology consulted. Urine culture grew MDR Pseudomonas, wound culture grew MDR Klebsiella and Pseudomonas for which ID was consulted, started ceftolozane-tazobactam. 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 Suprapubic catheter placed 8/19.      Assessment & Plan    Assessment and Plan:  Acute on chronic combined heart failure, with bilateral pleural effusions in the setting of hypoalbuminemia and malnutrition Some sob this morning but VS wnl.  CXR showed Interval improvement in aeration of the lungs since 06/04/2023.   Continue with IV lasix 40 mg BID, with good output.     Scrotal surgical wound infection S/p debridement for Fournier's gangrene Continue with ceftolozane and tazobactam per infectious disease Blood cultures continue to be negative. Wound care as per urology. Pain control with oxycodone 5 to 10 mg as needed and IV Dilaudid 0.5 mg every 4 hours as needed.   CAUTI versus asymptomatic bacteriuria  Suprapubic catheter now placed by IR 8/18.  - Urine culture with Pseudomonas. Culture with multidrug resistance but  without urinary symptoms, no directed treatment for this per ID.   Hematuria: with normocytic anemia.  - Holding eliquis pending operative plans, may need reexploration in 24-48 hrs.  - Monitor CBC, hgb stable at 10g/dl.    Right foot wound: No exudate or odor at this time.  - Continue regular podiatry follow up, optimize nutritional status and glycemic control.  - Keep covered, continue daily wound care per WOC.  - Offload with prevalon boot   Stage 2 sacral pressure injury: POA:  - Offload, foam dressing w/medihoney.    IDT2DM: Last HbA1c 7% shows adequate glycemic control to assist wound healing.  -Increase Semglee to 24 units at bedtime and sliding scale insulin. CBG (last 3)  Recent Labs    06/10/23 2136 06/11/23 0804 06/11/23 1102  GLUCAP 185* 227* 204*      PAF: In atrial fibrillation with CVR.   - Restart eliquis postop, timing deferred to urology (op note mentions possibility of return to OR, so not restarting yet), not on rate-control agent at baseline.   Tinea cruris:  - Continue nystatin powder.   Chronic pain syndrome Pain control.   Hypokalemia and hypomagnesemia Replaced.  Anemia of blood loss chronic.  Continue with iron supplementation with bowel regimen.    Adrenal insufficiency:  Resume cortef, midodrine .   Fall out of bed: Negative CT head, cervical spine. - Fall precautions   HLD:  - Continue statin   Depression:  - Continue escitalopram  Vitamin A deficiency:  - Supplementing - Also empirically supplement zinc and vitamin C to aid wound healing.    Malnutrition Type:  Nutrition Problem: Moderate Malnutrition Etiology: chronic illness   Malnutrition Characteristics:  Signs/Symptoms: moderate fat depletion, moderate muscle depletion   Nutrition Interventions:  Interventions: Ensure Enlive (each supplement provides 350kcal and 20 grams of protein), MVI, Juven, Liberalize Diet  Estimated body mass index is 31.6 kg/m as  calculated from the following:   Height as of this encounter: 6' 2.02" (1.88 m).   Weight as of this encounter: 111.7 kg.  Code Status: full code.  DVT Prophylaxis:  SCDs Start: 06/04/23 1157   Level of Care: Level of care: Telemetry Medical Family Communication: NONE AT BEDSIDE.   Procedures:    Consultants:   Urology Wound care  Infectious disease.  IR  Antimicrobials:   Anti-infectives (From admission, onward)    Start     Dose/Rate Route Frequency Ordered Stop   06/09/23 1600  vancomycin (VANCOREADY) IVPB 1250 mg/250 mL  Status:  Discontinued        1,250 mg 166.7 mL/hr over 90 Minutes Intravenous Every 24 hours 06/09/23 0621 06/09/23 1006   06/09/23 1400  ceftolozane-tazobactam (ZERBAXA) 3 g in sodium chloride 0.9 % 100 mL IVPB        3 g 122.8 mL/hr over 60 Minutes Intravenous Every 8 hours 06/09/23 0840     06/07/23 1445  ceftolozane-tazobactam (ZERBAXA) 1.5 g in sodium chloride 0.9 % 100 mL IVPB  Status:  Discontinued        1.5 g 111.4 mL/hr over 60 Minutes Intravenous Every 8 hours 06/07/23 1352 06/09/23 0840   06/05/23 1600  vancomycin (VANCOREADY) IVPB 1250 mg/250 mL  Status:  Discontinued        1,250 mg 166.7 mL/hr over 90 Minutes Intravenous Every 12 hours 06/05/23 1442 06/09/23 0601   06/05/23 1530  ceFEPIme (MAXIPIME) 2 g in sodium chloride 0.9 % 100 mL IVPB  Status:  Discontinued        2 g 200 mL/hr over 30 Minutes Intravenous Every 8 hours 06/05/23 1442 06/07/23 1352   06/04/23 1045  vancomycin (VANCOREADY) IVPB 1500 mg/300 mL       Placed in "Followed by" Linked Group   1,500 mg 150 mL/hr over 120 Minutes Intravenous  Once 06/04/23 1032 06/05/23 0515   06/04/23 1045  vancomycin (VANCOCIN) IVPB 1000 mg/200 mL premix       Placed in "Followed by" Linked Group   1,000 mg 200 mL/hr over 60 Minutes Intravenous  Once 06/04/23 1032 06/05/23 0527   06/04/23 1030  piperacillin-tazobactam (ZOSYN) IVPB 3.375 g        3.375 g 100 mL/hr over 30 Minutes  Intravenous  Once 06/04/23 1025 06/04/23 1428        Medications  Scheduled Meds:  acetaminophen  1,000 mg Oral Q8H   ascorbic acid  500 mg Oral Daily   atorvastatin  40 mg Oral QPM   Chlorhexidine Gluconate Cloth  6 each Topical Daily   escitalopram  10 mg Oral Daily   feeding supplement  237 mL Oral BID BM   ferrous sulfate  325 mg Oral Q breakfast  finasteride  5 mg Oral Daily   furosemide  40 mg Intravenous BID   hydrocortisone  5 mg Oral BID   insulin aspart  0-6 Units Subcutaneous TID WC   insulin glargine-yfgn  20 Units Subcutaneous QHS   leptospermum manuka honey  1 Application Topical Daily   midodrine  10 mg Oral TID with meals   multivitamin with minerals  1 tablet Oral Daily   nutrition supplement (JUVEN)  1 packet Oral BID BM   nystatin   Topical BID   oxyCODONE  15 mg Oral Q12H   pantoprazole  40 mg Oral Daily   saccharomyces boulardii  250 mg Oral BID   senna-docusate  1 tablet Oral BID   sodium chloride flush  5 mL Intracatheter Q8H   tamsulosin  0.4 mg Oral Daily   vitamin A  10,000 Units Oral Daily   zinc sulfate  220 mg Oral Daily   Continuous Infusions:  ceftolozane-tazobactam (ZERBAXA) 3 g in sodium chloride 0.9 % 100 mL IVPB 3 g (06/11/23 0546)   PRN Meds:.acetaminophen **OR** acetaminophen, albuterol, oxyCODONE    Subjective:   Peter Yang was seen and examined today.  Reports feeling sob. No chest pain or nausea, vomiting or abdominal pain.   Objective:   Vitals:   06/11/23 0415 06/11/23 0740 06/11/23 0810 06/11/23 0835  BP: (!) 142/80 111/68  130/76  Pulse: 92 86 87 88  Resp: 18 16 16 16   Temp: 98.2 F (36.8 C) 98.6 F (37 C)  98 F (36.7 C)  TempSrc: Oral Oral  Oral  SpO2: 95% 99% 98% 100%  Weight:      Height:        Intake/Output Summary (Last 24 hours) at 06/11/2023 1036 Last data filed at 06/11/2023 1010 Gross per 24 hour  Intake 809.92 ml  Output 6250 ml  Net -5440.08 ml   Filed Weights   06/08/23 0945 06/09/23  0417 06/10/23 0500  Weight: 112.4 kg 114.6 kg 111.7 kg     Exam General: Alert and oriented x 3, NAD Cardiovascular: S1 S2 auscultated, no murmurs, RRR Respiratory: diminished air entry at bases more on the left compared to the right. No tachypnea. On RA with 100% OXYGEN SATS. Gastrointestinal: Soft, nontender, nondistended, + bowel sounds Ext: right foot wound dressing in place, slightly edematous.  SKIN: stage 2 sacral wound , Neuro: AAOx3, Cr N's II- XII.  Psych: Normal affect and demeanor, alert and oriented x3  Supra pubic catheter in place.  Foley in Urethra.     Data Reviewed:  I have personally reviewed following labs and imaging studies   CBC Lab Results  Component Value Date   WBC 9.3 06/11/2023   RBC 4.08 (L) 06/11/2023   HGB 10.7 (L) 06/11/2023   HCT 35.6 (L) 06/11/2023   MCV 87.3 06/11/2023   MCH 26.2 06/11/2023   PLT 135 (L) 06/11/2023   MCHC 30.1 06/11/2023   RDW 23.9 (H) 06/11/2023   LYMPHSABS 1.1 06/04/2023   MONOABS 0.5 06/04/2023   EOSABS 0.6 (H) 06/04/2023   BASOSABS 0.0 06/04/2023     Last metabolic panel Lab Results  Component Value Date   NA 133 (L) 06/11/2023   K 3.5 06/11/2023   CL 97 (L) 06/11/2023   CO2 25 06/11/2023   BUN 37 (H) 06/11/2023   CREATININE 0.84 06/11/2023   GLUCOSE 212 (H) 06/11/2023   GFRNONAA >60 06/11/2023   CALCIUM 8.6 (L) 06/11/2023   PHOS 3.2 05/07/2023   PROT 5.0 (L)  06/05/2023   ALBUMIN 2.5 (L) 06/05/2023   BILITOT 1.0 06/05/2023   ALKPHOS 68 06/05/2023   AST 16 06/05/2023   ALT 11 06/05/2023   ANIONGAP 11 06/11/2023    CBG (last 3)  Recent Labs    06/10/23 1634 06/10/23 2136 06/11/23 0804  GLUCAP 119* 185* 227*      Coagulation Profile: No results for input(s): "INR", "PROTIME" in the last 168 hours.   Radiology Studies: DG CHEST PORT 1 VIEW  Result Date: 06/11/2023 CLINICAL DATA:  Dyspnea EXAM: PORTABLE CHEST - 1 VIEW COMPARISON:  06/04/2023 FINDINGS: Cardiomediastinal silhouette and  pulmonary vasculature are within normal limits. Mild hazy opacity at the right lung base likely combination of atelectasis and small pleural effusion. Minimal left pleural effusion likely also present. IMPRESSION: 1. Interval improvement in aeration of the lungs since 06/04/2023. 2. Small bilateral pleural effusions and right basilar atelectasis still remains. Electronically Signed   By: Acquanetta Belling M.D.   On: 06/11/2023 10:29       Kathlen Mody M.D. [facility] 06/11/2023, 10:36 AM  Available via Epic secure chat 7am-7pm After 7 pm, please refer to night coverage provider listed on amion.

## 2023-06-11 NOTE — Progress Notes (Signed)
PT called RN into the room and stated that he was having a hard time breathing. VS taking this AM and his oxygen was 100% on RA but stated he was still SOB. Respiratory was called and she came and gave him a breathing treatment, patient had a little relief but still not completely. About 15 minutes later pt called out again stating his SOB was increasing again repeat VS taken again O2 100% on RA, Rapid called to come and lay eyes on the patient and MD notified as well, STAT chest Xray ordered. Rapid RN stated she would be by to see the patient shortly, and to start him on the incentive. RN showed pt how to use the IS he achieved 1750 x4.

## 2023-06-11 NOTE — H&P (Deleted)
Right nephrolithiasis   67 year old male presents today in follow-up. He had a ureteral stent placed 10 days prior for an obstructing proximal ureteral stone. The patient has a functional solitary kidney. He is here today for discussion of PCNL. In addition to the small proximal ureteral stone he has large volume stones within the kidney.   Since the patient's stent was placed he is tolerating the stent reasonably well with a 2/10 pain. He states that he has pain every time he voids, which radiates up to his kidney, but otherwise seemingly doing well. Denies any gross hematuria or dysuria. Denies any fevers or chills.   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     ALLERGIES: No Allergies    MEDICATIONS: No Medications    GU PSH: No GU PSH    NON-GU PSH: No Non-GU PSH    GU PMH: No GU PMH    NON-GU PMH: No Non-GU PMH    FAMILY HISTORY: No Family History    SOCIAL HISTORY: No Social History    VITAL SIGNS:      06/05/2023 11:39 AM  Weight 145 lb / 65.77 kg  BP 145/76 mmHg  Pulse 71 /min  Temperature 97.3 F / 36.2 C   MULTI-SYSTEM PHYSICAL EXAMINATION:    Constitutional: Well-nourished. No physical deformities. Normally developed. Good grooming.  Neck: Neck symmetrical, not swollen. Normal tracheal position.  Respiratory: Normal breath sounds. No labored breathing, no use of accessory muscles.   Cardiovascular: Regular rate and rhythm. No murmur, no gallop. Normal temperature, normal extremity pulses, no swelling, no varicosities.   Lymphatic: No enlargement of neck, axillae, groin.  Skin: No paleness, no jaundice, no cyanosis. No lesion, no ulcer, no rash.  Neurologic / Psychiatric: Oriented to time, oriented to place, oriented to person. No depression, no anxiety, no agitation.  Gastrointestinal: No mass, no tenderness, no rigidity, non obese abdomen.  Eyes: Normal conjunctivae. Normal eyelids.  Ears, Nose, Mouth, and Throat: Left  ear no scars, no lesions, no masses. Right ear no scars, no lesions, no masses. Nose no scars, no lesions, no masses. Normal hearing. Normal lips.  Musculoskeletal: Normal gait and station of head and neck.     Complexity of Data:  Source Of History:  Patient  Records Review:   Previous Doctor Records, Previous Hospital Records, Previous Patient Records, POC Tool  Urine Test Review:   Urinalysis   PROCEDURES:          Urinalysis w/Scope Dipstick Dipstick Cont'd Micro  Color: Yellow Bilirubin: Neg mg/dL WBC/hpf: 0 - 5/hpf  Appearance: Cloudy Ketones: Neg mg/dL RBC/hpf: >02/VOZ  Specific Gravity: 1.020 Blood: 3+ ery/uL Bacteria: Rare (0-9/hpf)  pH: 6.0 Protein: 1+ mg/dL Cystals: NS (Not Seen)  Glucose: Neg mg/dL Urobilinogen: 0.2 mg/dL Casts: NS (Not Seen)    Nitrites: Neg Trichomonas: Not Present    Leukocyte Esterase: Trace leu/uL Mucous: Present      Epithelial Cells: 0 - 5/hpf      Yeast: NS (Not Seen)      Sperm: Not Present    ASSESSMENT:      ICD-10 Details  1 GU:   Renal and ureteral calculus - N20.2    PLAN:           Document Letter(s):  Created for Patient: Clinical Summary         Notes:   The patient I discussed the PCNL procedure which is scheduled for next week. We discussed the  renal access. We discussed the potential complications and risks. We also discussed the hospital course. After reviewing all of it, he is opted to proceed.

## 2023-06-12 ENCOUNTER — Other Ambulatory Visit: Payer: Self-pay

## 2023-06-12 ENCOUNTER — Inpatient Hospital Stay (HOSPITAL_COMMUNITY): Payer: Medicare Other

## 2023-06-12 ENCOUNTER — Other Ambulatory Visit (HOSPITAL_COMMUNITY): Payer: Self-pay

## 2023-06-12 DIAGNOSIS — T8131XD Disruption of external operation (surgical) wound, not elsewhere classified, subsequent encounter: Secondary | ICD-10-CM | POA: Diagnosis not present

## 2023-06-12 DIAGNOSIS — E1151 Type 2 diabetes mellitus with diabetic peripheral angiopathy without gangrene: Secondary | ICD-10-CM | POA: Diagnosis not present

## 2023-06-12 DIAGNOSIS — J90 Pleural effusion, not elsewhere classified: Secondary | ICD-10-CM | POA: Diagnosis not present

## 2023-06-12 DIAGNOSIS — R310 Gross hematuria: Secondary | ICD-10-CM | POA: Diagnosis not present

## 2023-06-12 DIAGNOSIS — I480 Paroxysmal atrial fibrillation: Secondary | ICD-10-CM | POA: Diagnosis not present

## 2023-06-12 LAB — BASIC METABOLIC PANEL
Anion gap: 10 (ref 5–15)
BUN: 30 mg/dL — ABNORMAL HIGH (ref 8–23)
CO2: 25 mmol/L (ref 22–32)
Calcium: 8.3 mg/dL — ABNORMAL LOW (ref 8.9–10.3)
Chloride: 97 mmol/L — ABNORMAL LOW (ref 98–111)
Creatinine, Ser: 0.71 mg/dL (ref 0.61–1.24)
GFR, Estimated: >60 mL/min (ref >60)
Glucose, Bld: 186 mg/dL — ABNORMAL HIGH (ref 70–99)
Potassium: 2.9 mmol/L — ABNORMAL LOW (ref 3.5–5.1)
Sodium: 132 mmol/L — ABNORMAL LOW (ref 135–145)

## 2023-06-12 LAB — GLUCOSE, CAPILLARY
Glucose-Capillary: 192 mg/dL — ABNORMAL HIGH (ref 70–99)
Glucose-Capillary: 188 mg/dL — ABNORMAL HIGH (ref 70–99)
Glucose-Capillary: 248 mg/dL — ABNORMAL HIGH (ref 70–99)
Glucose-Capillary: 209 mg/dL — ABNORMAL HIGH (ref 70–99)
Glucose-Capillary: 187 mg/dL — ABNORMAL HIGH (ref 70–99)

## 2023-06-12 MED ORDER — INSULIN ASPART 100 UNIT/ML IJ SOLN
0.0000 [IU] | Freq: Every day | INTRAMUSCULAR | Status: DC
  Administered 2023-06-13 – 2023-06-18 (×2): 2 [IU] via SUBCUTANEOUS

## 2023-06-12 MED ORDER — INSULIN ASPART 100 UNIT/ML IJ SOLN
0.0000 [IU] | Freq: Three times a day (TID) | INTRAMUSCULAR | Status: DC
  Administered 2023-06-12: 5 [IU] via SUBCUTANEOUS
  Administered 2023-06-12 – 2023-06-13 (×3): 3 [IU] via SUBCUTANEOUS
  Administered 2023-06-13: 5 [IU] via SUBCUTANEOUS
  Administered 2023-06-14 – 2023-06-15 (×2): 2 [IU] via SUBCUTANEOUS
  Administered 2023-06-16 – 2023-06-18 (×6): 3 [IU] via SUBCUTANEOUS
  Administered 2023-06-18: 2 [IU] via SUBCUTANEOUS
  Administered 2023-06-18 – 2023-06-19 (×2): 3 [IU] via SUBCUTANEOUS

## 2023-06-12 MED ORDER — INSULIN ASPART 100 UNIT/ML IJ SOLN: 0.0000 [IU] | Freq: Three times a day (TID) | INTRAMUSCULAR | Status: DC

## 2023-06-12 MED ORDER — GLUCERNA SHAKE PO LIQD
237.0000 mL | Freq: Three times a day (TID) | ORAL | Status: DC
  Administered 2023-06-12 – 2023-06-19 (×21): 237 mL via ORAL
  Filled 2023-06-12 (×2): qty 237

## 2023-06-12 MED ORDER — POTASSIUM CHLORIDE CRYS ER 20 MEQ PO TBCR
40.0000 meq | EXTENDED_RELEASE_TABLET | Freq: Two times a day (BID) | ORAL | Status: AC
  Administered 2023-06-12 (×2): 40 meq via ORAL
  Filled 2023-06-12 (×2): qty 2

## 2023-06-12 MED ORDER — APIXABAN 5 MG PO TABS
5.0000 mg | ORAL_TABLET | Freq: Two times a day (BID) | ORAL | Status: DC
  Administered 2023-06-12 – 2023-06-17 (×10): 5 mg via ORAL
  Filled 2023-06-12 (×10): qty 1

## 2023-06-12 NOTE — Progress Notes (Signed)
Urologist came by and wants to dress pt groin tomorrow, if needed please just reinforce dressing.

## 2023-06-12 NOTE — Progress Notes (Signed)
   4 Days Post-Op Subjective: NAEON. Alert and oriented.  Discussed outpatient follow-up again.  Objective: Vital signs in last 24 hours: Temp:  [98 F (36.7 C)-98.5 F (36.9 C)] 98.5 F (36.9 C) (08/22 0842) Pulse Rate:  [84-101] 88 (08/22 0842) Resp:  [16] 16 (08/22 0842) BP: (109-129)/(57-77) 113/57 (08/22 0842) SpO2:  [90 %-99 %] 90 % (08/22 0842) Weight:  [111.7 kg] 111.7 kg (08/22 0500)  Assessment/Plan: # Purulent drainage from incisions #urethral fistula Sutures removed in ED. There was initially a pinhole sinus draining at the bottom of the right side suture.  Would culture (+) Kleb,Pseud. Broadly covered. UC (+) Pseudomonas Taken back to OR by Dr. Annabell Howells for debridement 8/18 Signs of urethral fistula. SP Tube placed in IR 8/19. Exchange and dilate at 6 weeks with IR.  Wound care completed by nursing prior to my arrival.  Serous drainage on bandages.   If reliable wound care can be established at one of his facilities, he can discharge whenever medically stable. Patient has been seen in hospital multiple times now.  From what I can see follow-up has never been established.  I continue to request that once he is placed, a point of contact be provided so that I can have my office schedule him an appointment.   #hydronephrosis Chronic in nature.  This has been noted on multiple CT images after adequate time provided for urinary decompression. Preserved renal function    Intake/Output from previous day: 08/21 0701 - 08/22 0700 In: 50 [IV Piggyback:50] Out: 4350 [Urine:4350]  Intake/Output this shift: Total I/O In: 360 [P.O.:360] Out: 650 [Urine:650]  Physical Exam:  General: Alert and oriented CV: No cyanosis Lungs: equal chest rise Abdomen: Soft, NTND, no rebound or guarding Skin: bilateral scrotal incisions. Red beefy pink wound beds. No purulent drainage. Gu: Foley removed, SPT draining clear yellow urine  Lab Results: Recent Labs    06/09/23 2352  06/11/23 0054  HGB 9.1* 10.7*  HCT 29.9* 35.6*   BMET Recent Labs    06/11/23 0054 06/12/23 1159  NA 133* 132*  K 3.5 2.9*  CL 97* 97*  CO2 25 25  GLUCOSE 212* 186*  BUN 37* 30*  CREATININE 0.84 0.71  CALCIUM 8.6* 8.3*     Studies/Results: [place] EKG SITE RITE  Result Date: 06/12/2023 If [facility] image not attached, placement could not be confirmed due to current cardiac rhythm.  DG CHEST PORT 1 VIEW  Result Date: 06/11/2023 CLINICAL DATA:  Dyspnea EXAM: PORTABLE CHEST - 1 VIEW COMPARISON:  06/04/2023 FINDINGS: Cardiomediastinal silhouette and pulmonary vasculature are within normal limits. Mild hazy opacity at the right lung base likely combination of atelectasis and small pleural effusion. Minimal left pleural effusion likely also present. IMPRESSION: 1. Interval improvement in aeration of the lungs since 06/04/2023. 2. Small bilateral pleural effusions and right basilar atelectasis still remains. Electronically Signed   By: Acquanetta Belling M.D.   On: 06/11/2023 10:29      LOS: 8 days   Peter Kirschner, NP [facility] Pager: [phone]  06/12/2023, 2:44 PM

## 2023-06-12 NOTE — Progress Notes (Signed)
Nutrition Follow-up  DOCUMENTATION CODES:   Non-severe (moderate) malnutrition in context of chronic illness  INTERVENTION:  Multivitamin w/ minerals daily Discontinue Ensure Enlive, replace Glucerna Shake po TID, each supplement provides 220 kcal and 10 grams of protein 1 packet Juven BID, each packet provides 95 calories, 2.5 grams of protein (collagen), and 9.8 grams of carbohydrate (3 grams sugar); also contains 7 grams of L-arginine and L-glutamine, 300 mg vitamin C, 15 mg vitamin E, 1.2 mcg vitamin B-12, 9.5 mg zinc, 200 mg calcium, and 1.5 g  Calcium Beta-hydroxy-Beta-methylbutyrate to support wound healing Liberalize pt diet to Carb Modified due to malnutrition and increased needs for healing.  Meal ordering with assist Encourage good PO intake  Continue micronutrient supplementation due to chronic wounds, recommend rechecking after supplementation is complete.  Vitamin A 10,000 international units daily x 14 days Vitamin C 500 mg daily x 30 days Zinc 220 mg daily x 14 days   NUTRITION DIAGNOSIS:   Moderate Malnutrition related to chronic illness as evidenced by moderate fat depletion, moderate muscle depletion. - Ongoing   GOAL:   Patient will meet greater than or equal to 90% of their needs - Ongoing  MONITOR:   PO intake, Supplement acceptance, Labs, Skin  REASON FOR ASSESSMENT:   Consult Poor PO, Wound healing  ASSESSMENT:   67 y.o. male presented to the ED after a fall, hitting his head. PMH includes T2DM, HLD, BPH, CVA, s/p L BKA, HTN, CHF, and CAD. Pt admitted with acute on chronic heart failure, UTI, and Fournier's gangrene.   8/14 - Admitted  8/16 - diet liberalized to Carb modified 8/18 - Op, bilateral scrotal exploration and cystoscopy 8/19 - NPO; s/p suprapubic bladder catheter placement; diet advanced to HH/CM  Pt resting at time of RD visit, woke to RD voice. Reports that he did not eat breakfast this morning, but did not get a reason why. Observed a  Glucerna shake and Juven at bedside. Pt reports that they are ok to drink. RD reiterated the importance of oral nutrition supplements to assist with healing. Pt expressed understanding. Denies any nausea or vomiting.   Meal Intake 8/15: 75-100% x 3 meals  8/17-8/20: 100% x 5 meals  UOP: 4350 mL x 24 hrs  Medications reviewed and include: Vitamin C, Ferrous Sulfate, Lasix, Novolog SSI, Semglee, Protonix, MVI, Potassium Chloride , Florastor, Senokot-S, Vitamin A, Zinc Sulfate, IV antibiotics  Labs reviewed: Sodium 132, Potassium 2.9, BUN 30, Creatinine 0.71 CBG: 175-237 x 24 hrs   Micronutrient Labs CRP: 2.9 (high - 06/04/23) Vitamin D: 28.96 (high - 04/11/23) Vitamin A: 10.7 (high - 04/11/23) Vitamin B12: 824 (WNL - 04/11/23) Copper: 114 (WNL - 04/11/23)  Diet Order:   Diet Order             Diet Carb Modified Fluid consistency: Thin; Room service appropriate? Yes with Assist  Diet effective now                  EDUCATION NEEDS:  Education needs have been addressed  Skin:  Skin Assessment: Skin Integrity Issues: Skin Integrity Issues:: Stage II, Other (Comment) Stage II: Sacrum Other: Right foot  Last BM:  8/22  Height:  Ht Readings from Last 1 Encounters:  06/08/23 6' 2.02" (1.88 m)   Weight:  Wt Readings from Last 1 Encounters:  06/12/23 111.7 kg   Ideal Body Weight:  86.4 kg  BMI:  Body mass index is 31.6 kg/m. Adjusted BMI for L BKA: 34.2 kg/m2  Estimated  Nutritional Needs:  Kcal:  2200-2400 Protein:  110-130 grams Fluid:  >/= 2 L   Kirby Crigler RD, LDN Clinical Dietitian See [facility] for contact information.

## 2023-06-12 NOTE — Progress Notes (Signed)
PICC was able to come by to see patient tonight but patient wishes to wait until tomorrow for PICC.

## 2023-06-12 NOTE — Progress Notes (Signed)
[facility]                                                                               Mariana Arens, is a 67 y.o. male, DOB - [birth year], WUJ:[id number] Admit date - 06/04/2023    Outpatient Primary MD for the patient is Patient, No Pcp Per  LOS - 8  days    Brief summary    67 y.o. male with a history of T2DM with polyneuropathy, DFU s/p left BKA, CAD s/p stent, HLD, PAF, HFrEF, adrenal insufficiency, CVA, BPH with chronic foley, and recent surgeries for Fournier's gangrene June 2024 having completed IV antibiotics 7/30 who presented to the ED on 06/04/2023 after falling out of bed at [facility]. He was afebrile with dehiscence of left scrotal wound with discharge. Work up showed WBC 9.3k, ESR 4, PCT <0.10, CRP 2.9 (down from 20.7 on 8/7), lactic acid 2.1 > 1.8. IV antibiotics were given and urology consulted. Urine culture grew MDR Pseudomonas, wound culture grew MDR Klebsiella and Pseudomonas for which ID was consulted, started ceftolozane-tazobactam. 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 Suprapubic catheter placed 8/19.      Assessment & Plan    Assessment and Plan:  Acute on chronic combined heart failure, with bilateral pleural effusions in the setting of hypoalbuminemia and malnutrition CXR showed Interval improvement in aeration of the lungs since 06/04/2023.   Continue with IV lasix 40 mg BID, with good output.  Creatinine stable, . Replace potassium.  Check BMP in am.   Hyponatremia:  ? Fluid overload.   Scrotal surgical wound infection S/p debridement for Fournier's gangrene Continue with ceftazidime avibactam every 8 hours per infectious disease Blood cultures continue to be negative. Wound care as per urology. Pain control with oxycodone 5 to 10 mg as needed and IV Dilaudid 0.5 mg every 4 hours as needed.   CAUTI versus asymptomatic bacteriuria  Suprapubic catheter now placed by IR 8/18.  - Urine  culture with Pseudomonas. Culture with multidrug resistance but without urinary symptoms, no directed treatment for this per ID.   Hematuria: with normocytic anemia.  - Monitor CBC, hgb stable at 10g/dl.    Right foot wound: No exudate or odor at this time.  - Continue regular podiatry follow up, optimize nutritional status and glycemic control.  - Keep covered, continue daily wound care per WOC.  - Offload with prevalon boot - x rays of the foot today.    Stage 2 sacral pressure injury: POA:  - Offload, foam dressing w/medihoney.    IDT2DM: Last HbA1c 7% shows adequate glycemic control to assist wound healing.  -Increase Semglee to 24 units at bedtime and sliding scale insulin. CBG (last 3)  Recent Labs    06/11/23 1656 06/11/23 2042 06/12/23 0837  GLUCAP 175* 237* 192*      PAF: In atrial fibrillation with CVR.   - Restarted eliquis today, as we Urology recommends exchange and dilatation at 6 weeks.   not on rate-control agent at baseline.   Tinea cruris:  - Continue nystatin powder.   Chronic pain syndrome Pain control.   Hypokalemia and hypomagnesemia Replaced.  Repeat labs in am.    Anemia of blood loss chronic.  Continue with iron supplementation with bowel regimen.    Adrenal insufficiency:  Resume cortef, midodrine .   Fall out of bed: Negative CT head, cervical spine. - Fall precautions   HLD:  - Continue statin   Depression:  - Continue escitalopram  Vitamin A deficiency:  - Supplementing - Also empirically supplement zinc and vitamin C to aid wound healing.    Malnutrition Type:  Nutrition Problem: Moderate Malnutrition Etiology: chronic illness   Malnutrition Characteristics:  Signs/Symptoms: moderate fat depletion, moderate muscle depletion   Nutrition Interventions:  Interventions: Ensure Enlive (each supplement provides 350kcal and 20 grams of protein), MVI, Juven, Liberalize Diet  Estimated body mass index is 31.6 kg/m as  calculated from the following:   Height as of this encounter: 6' 2.02" (1.88 m).   Weight as of this encounter: 111.7 kg.  Code Status: full code.  DVT Prophylaxis:  SCDs Start: 06/04/23 1157   Level of Care: Level of care: Telemetry Medical Family Communication: NONE AT BEDSIDE.   Procedures:    Consultants:   Urology Wound care  Infectious disease.  IR  Antimicrobials:   Anti-infectives (From admission, onward)    Start     Dose/Rate Route Frequency Ordered Stop   06/11/23 1400  ceftazidime-avibactam (AVYCAZ) 2.5 g in dextrose 5 % 50 mL IVPB        2.5 g 25 mL/hr over 2 Hours Intravenous Every 8 hours 06/11/23 1304     06/09/23 1600  vancomycin (VANCOREADY) IVPB 1250 mg/250 mL  Status:  Discontinued        1,250 mg 166.7 mL/hr over 90 Minutes Intravenous Every 24 hours 06/09/23 0621 06/09/23 1006   06/09/23 1400  ceftolozane-tazobactam (ZERBAXA) 3 g in sodium chloride 0.9 % 100 mL IVPB  Status:  Discontinued        3 g 122.8 mL/hr over 60 Minutes Intravenous Every 8 hours 06/09/23 0840 06/11/23 1304   06/07/23 1445  ceftolozane-tazobactam (ZERBAXA) 1.5 g in sodium chloride 0.9 % 100 mL IVPB  Status:  Discontinued        1.5 g 111.4 mL/hr over 60 Minutes Intravenous Every 8 hours 06/07/23 1352 06/09/23 0840   06/05/23 1600  vancomycin (VANCOREADY) IVPB 1250 mg/250 mL  Status:  Discontinued        1,250 mg 166.7 mL/hr over 90 Minutes Intravenous Every 12 hours 06/05/23 1442 06/09/23 0601   06/05/23 1530  ceFEPIme (MAXIPIME) 2 g in sodium chloride 0.9 % 100 mL IVPB  Status:  Discontinued        2 g 200 mL/hr over 30 Minutes Intravenous Every 8 hours 06/05/23 1442 06/07/23 1352   06/04/23 1045  vancomycin (VANCOREADY) IVPB 1500 mg/300 mL       Placed in "Followed by" Linked Group   1,500 mg 150 mL/hr over 120 Minutes Intravenous  Once 06/04/23 1032 06/05/23 0515   06/04/23 1045  vancomycin (VANCOCIN) IVPB 1000 mg/200 mL premix       Placed in "Followed by" Linked Group    1,000 mg 200 mL/hr over 60 Minutes Intravenous  Once 06/04/23 1032 06/05/23 0527   06/04/23 1030  piperacillin-tazobactam (ZOSYN) IVPB 3.375 g        3.375 g 100 mL/hr over 30 Minutes Intravenous  Once 06/04/23 1025 06/04/23 1428        Medications  Scheduled Meds:  acetaminophen  1,000 mg Oral Q8H   ascorbic acid  500 mg  Oral Daily   atorvastatin  40 mg Oral QPM   Chlorhexidine Gluconate Cloth  6 each Topical Daily   escitalopram  10 mg Oral Daily   feeding supplement  237 mL Oral BID BM   ferrous sulfate  325 mg Oral Q breakfast   finasteride  5 mg Oral Daily   furosemide  40 mg Intravenous BID   hydrocortisone  5 mg Oral BID   insulin aspart  0-6 Units Subcutaneous TID WC   insulin glargine-yfgn  24 Units Subcutaneous QHS   leptospermum manuka honey  1 Application Topical Daily   midodrine  10 mg Oral TID with meals   multivitamin with minerals  1 tablet Oral Daily   nutrition supplement (JUVEN)  1 packet Oral BID BM   nystatin   Topical BID   oxyCODONE  15 mg Oral Q12H   pantoprazole  40 mg Oral Daily   saccharomyces boulardii  250 mg Oral BID   senna-docusate  1 tablet Oral BID   sodium chloride flush  5 mL Intracatheter Q8H   tamsulosin  0.4 mg Oral Daily   vitamin A  10,000 Units Oral Daily   zinc sulfate  220 mg Oral Daily   Continuous Infusions:  ceftazidime-avibactam (AVYCAZ) 2.5 g in dextrose 5 % 50 mL IVPB 2.5 g (06/12/23 0716)   PRN Meds:.acetaminophen **OR** acetaminophen, albuterol, HYDROmorphone (DILAUDID) injection, oxyCODONE    Subjective:   Lael Garhart was seen and examined today.  Pain better controlled with IV dilaudid. No chest pain or sob.   Objective:   Vitals:   06/11/23 2000 06/12/23 0500 06/12/23 0536 06/12/23 0842  BP: 114/77  129/75 (!) 113/57  Pulse: 93  (!) 101 88  Resp:    16  Temp: 98.1 F (36.7 C)  98 F (36.7 C) 98.5 F (36.9 C)  TempSrc: Oral   Oral  SpO2: 96%  99% 90%  Weight:  111.7 kg    Height:         Intake/Output Summary (Last 24 hours) at 06/12/2023 0854 Last data filed at 06/11/2023 2310 Gross per 24 hour  Intake 50 ml  Output 4350 ml  Net -4300 ml   Filed Weights   06/09/23 0417 06/10/23 0500 06/12/23 0500  Weight: 114.6 kg 111.7 kg 111.7 kg     Exam General exam: Ill appearing gentleman, not in distress on RA.  Respiratory system:  Cardiovascular system: S1 & S2 heard, RRR.  Gastrointestinal system: Abdomen is nondistended, soft and nontender. Central nervous system: Alert and oriented. No focal neurological deficits. Extremities: right foot wound bandaged.  Skin: No rashes,  Psychiatry: flat affect,.     Data Reviewed:  I have personally reviewed following labs and imaging studies   CBC Lab Results  Component Value Date   WBC 9.3 06/11/2023   RBC 4.08 (L) 06/11/2023   HGB 10.7 (L) 06/11/2023   HCT 35.6 (L) 06/11/2023   MCV 87.3 06/11/2023   MCH 26.2 06/11/2023   PLT 135 (L) 06/11/2023   MCHC 30.1 06/11/2023   RDW 23.9 (H) 06/11/2023   LYMPHSABS 1.1 06/04/2023   MONOABS 0.5 06/04/2023   EOSABS 0.6 (H) 06/04/2023   BASOSABS 0.0 06/04/2023     Last metabolic panel Lab Results  Component Value Date   NA 133 (L) 06/11/2023   K 3.5 06/11/2023   CL 97 (L) 06/11/2023   CO2 25 06/11/2023   BUN 37 (H) 06/11/2023   CREATININE 0.84 06/11/2023   GLUCOSE 212 (H) 06/11/2023  GFRNONAA >60 06/11/2023   CALCIUM 8.6 (L) 06/11/2023   PHOS 3.2 05/07/2023   PROT 5.0 (L) 06/05/2023   ALBUMIN 2.5 (L) 06/05/2023   BILITOT 1.0 06/05/2023   ALKPHOS 68 06/05/2023   AST 16 06/05/2023   ALT 11 06/05/2023   ANIONGAP 11 06/11/2023    CBG (last 3)  Recent Labs    06/11/23 1656 06/11/23 2042 06/12/23 0837  GLUCAP 175* 237* 192*      Coagulation Profile: No results for input(s): "INR", "PROTIME" in the last 168 hours.   Radiology Studies: DG CHEST PORT 1 VIEW  Result Date: 06/11/2023 CLINICAL DATA:  Dyspnea EXAM: PORTABLE CHEST - 1 VIEW COMPARISON:   06/04/2023 FINDINGS: Cardiomediastinal silhouette and pulmonary vasculature are within normal limits. Mild hazy opacity at the right lung base likely combination of atelectasis and small pleural effusion. Minimal left pleural effusion likely also present. IMPRESSION: 1. Interval improvement in aeration of the lungs since 06/04/2023. 2. Small bilateral pleural effusions and right basilar atelectasis still remains. Electronically Signed   By: Acquanetta Belling M.D.   On: 06/11/2023 10:29       Kathlen Mody M.D. [facility] 06/12/2023, 8:54 AM  Available via Epic secure chat 7am-7pm After 7 pm, please refer to night coverage provider listed on amion.

## 2023-06-12 NOTE — Plan of Care (Signed)
  Problem: Clinical Measurements: Goal: Diagnostic test results will improve Outcome: Progressing   Problem: Nutrition: Goal: Adequate nutrition will be maintained Outcome: Progressing   Problem: Coping: Goal: Level of anxiety will decrease Outcome: Not Progressing   Problem: Elimination: Goal: Will not experience complications related to bowel motility Outcome: Progressing   Problem: Pain Managment: Goal: General experience of comfort will improve Outcome: Not Progressing

## 2023-06-12 NOTE — Progress Notes (Signed)
[facility]  Date of Admission:  06/04/2023   Total days of inpatient antibiotics 7  Principal Problem:   Wound dehiscence in puerperium, perineal Active Problems:   PAF (paroxysmal atrial fibrillation) (HCC)   Hematuria   Pleural effusion, bilateral   Type 2 diabetes mellitus with diabetic peripheral angiopathy without gangrene, without long-term current use of insulin (HCC)   UTI (urinary tract infection) due to urinary indwelling catheter (HCC)   Soft tissue infection   Malnutrition of moderate degree   History of MDR Pseudomonas aeruginosa infection          Assessment: 67 year old male with history of forearm secondary status post I&D on 6/12, 03/1919 24 status post ertapenem/Dapto x 6 weeks EOT 7/30 admitted with 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  Cefepime was switched to Zyprexa-patient underwent bilateral scrotal aspiration and cystoscopy on 8/18 with urology - Noted urethral fistula, consulted IR for suprapubic to divert urine from the urethra Recommendations: - Pseudomonas sensitivities returned sensitive to Zerbaxa and Avycaz.  Patient is transition to Pricilla Holm to preserve Zerbaxa in the future.  Please PICC line.  Plan on 3 weeks of IV antibiotics from OR on 8/18 EOT 9/7.  Cefiderocol sensitivities pending. - ID will sign off #Right foot ulcer - Followed by podiatry outpatient - Will get x-ray to assess wound in case is contributing to clinical picture.  Recommend continued follow-up with podiatry outpatient. OPAT ORDERS:  Diagnosis: Infected groin wound with kleb pneumo and MDR klebseilla  Allergies  Allergen Reactions   Sglt2 Inhibitors Other (See  Comments)    Fournier's gangrene     Discharge antibiotics to be given via PICC line:  Per pharmacy protocol Acycaz 2.5g q8h   Duration: 3 weeks End Date: 9/7  [facility] Care Per Protocol with [employer] Use: Home health RN for IV administration and teaching, line care and labs.    Labs weekly while on IV antibiotics: __x CBC with differential __ BMP **TWICE WEEKLY ON VANCOMYCIN  x__ CMP x__ CRP _x_ ESR __ Vancomycin trough TWICE WEEKLY __ CK  __ Please pull PIC at completion of IV antibiotics _x_ Please leave PIC in place until doctor has seen patient or been notified  Fax weekly labs to [phone]  Clinic Follow Up Appt: 9/6  @ [facility] with Dr. vu  Microbiology:   Antibiotics: Zerbaxa 8/17-8/20 Cefepime 8/15-17 Vancomycin 8/14-8/18 Avycaz 8/21- Resting in bed.  No new complaints. Interval: Afebrile overnight.  Review of Systems: Review of Systems  All other systems reviewed and are negative.    Scheduled Meds:  acetaminophen  1,000 mg Oral Q8H   ascorbic acid  500 mg Oral Daily   atorvastatin  40 mg Oral QPM   Chlorhexidine Gluconate Cloth  6 each Topical Daily   escitalopram  10 mg Oral Daily   feeding supplement  237 mL Oral BID BM   ferrous sulfate  325 mg Oral Q breakfast   finasteride  5 mg Oral Daily   furosemide  40 mg Intravenous BID   hydrocortisone  5 mg Oral BID   insulin aspart  0-15 Units Subcutaneous TID WC   insulin aspart  0-5 Units Subcutaneous QHS   insulin glargine-yfgn  24 Units Subcutaneous QHS  leptospermum manuka honey  1 Application Topical Daily   midodrine  10 mg Oral TID with meals   multivitamin with minerals  1 tablet Oral Daily   nutrition supplement (JUVEN)  1 packet Oral BID BM   nystatin   Topical BID   oxyCODONE  15 mg Oral Q12H   pantoprazole  40 mg Oral Daily   saccharomyces boulardii  250 mg Oral BID   senna-docusate  1 tablet Oral BID   sodium chloride flush  5 mL Intracatheter Q8H   tamsulosin  0.4 mg Oral  Daily   vitamin A  10,000 Units Oral Daily   zinc sulfate  220 mg Oral Daily   Continuous Infusions:  ceftazidime-avibactam (AVYCAZ) 2.5 g in dextrose 5 % 50 mL IVPB 2.5 g (06/12/23 0716)   PRN Meds:.acetaminophen **OR** acetaminophen, albuterol, HYDROmorphone (DILAUDID) injection, oxyCODONE Allergies  Allergen Reactions   Sglt2 Inhibitors Other (See Comments)    Fournier's gangrene    OBJECTIVE: Vitals:   06/11/23 2000 06/12/23 0500 06/12/23 0536 06/12/23 0842  BP: 114/77  129/75 (!) 113/57  Pulse: 93  (!) 101 88  Resp:    16  Temp: 98.1 F (36.7 C)  98 F (36.7 C) 98.5 F (36.9 C)  TempSrc: Oral   Oral  SpO2: 96%  99% 90%  Weight:  111.7 kg    Height:       Body mass index is 31.6 kg/m.  Physical Exam Constitutional:      General: He is not in acute distress.    Appearance: He is normal weight. He is not toxic-appearing.  HENT:     Head: Normocephalic and atraumatic.     Right Ear: External ear normal.     Left Ear: External ear normal.     Nose: No congestion or rhinorrhea.     Mouth/Throat:     Mouth: Mucous membranes are moist.     Pharynx: Oropharynx is clear.  Eyes:     Extraocular Movements: Extraocular movements intact.     Conjunctiva/sclera: Conjunctivae normal.     Pupils: Pupils are equal, round, and reactive to light.  Cardiovascular:     Rate and Rhythm: Normal rate and regular rhythm.     Heart sounds: No murmur heard.    No friction rub. No gallop.  Pulmonary:     Effort: Pulmonary effort is normal.     Breath sounds: Normal breath sounds.  Abdominal:     General: Abdomen is flat. Bowel sounds are normal.     Palpations: Abdomen is soft.  Genitourinary:    Comments: Groin wounds. Musculoskeletal:        General: No swelling.     Cervical back: Normal range of motion and neck supple.     Comments: Right foot bandaged  Skin:    General: Skin is warm and dry.  Neurological:     General: No focal deficit present.     Mental Status: He  is oriented to person, place, and time.  Psychiatric:        Mood and Affect: Mood normal.       Lab Results Lab Results  Component Value Date   WBC 9.3 06/11/2023   HGB 10.7 (L) 06/11/2023   HCT 35.6 (L) 06/11/2023   MCV 87.3 06/11/2023   PLT 135 (L) 06/11/2023    Lab Results  Component Value Date   CREATININE 0.84 06/11/2023   BUN 37 (H) 06/11/2023   NA 133 (L) 06/11/2023   K 3.5  06/11/2023   CL 97 (L) 06/11/2023   CO2 25 06/11/2023    Lab Results  Component Value Date   ALT 11 06/05/2023   AST 16 06/05/2023   ALKPHOS 68 06/05/2023   BILITOT 1.0 06/05/2023        Danelle Earthly, MD [facility] 06/12/2023, 11:49 AM   I have personally spent 54 minutes involved in face-to-face and non-face-to-face activities for this patient on the day of the visit. Professional time spent includes the following activities: Preparing to see the patient (review of tests), Obtaining and/or reviewing separately obtained history (admission/discharge record), Performing a medically appropriate examination and/or evaluation , Ordering medications/tests/procedures, referring and communicating with other health care professionals, Documenting clinical information in the EMR, Independently interpreting results (not separately reported), Communicating results to the patient/family/caregiver, Counseling and educating the patient/family/caregiver and Care coordination (not separately reported).

## 2023-06-12 NOTE — Progress Notes (Signed)
PICC order noted, Spoke with Tameka, LPN via securechat.  Per Tameka patient is not discharging today.  Made aware that PICC will not be placed today.

## 2023-06-12 NOTE — Progress Notes (Addendum)
PHARMACY CONSULT NOTE FOR:  OUTPATIENT  PARENTERAL ANTIBIOTIC THERAPY (OPAT)  Informational only as planned discharge to SNF - confirmed with CSW  Carley Hammed) that SNF will take back on this antibiotic regimen  Indication: MDR PsA + Kleb PNA Fournier's gangrene Regimen: Avycaz 2.5g IV every 8 hours + Metronidazole 500 mg po bid  End date: 9/7 (3 weeks from last OR 06/08/23)  IV antibiotic discharge orders are pended. To discharging provider:  please sign these orders via discharge navigator,  Select New Orders & click on the button choice - Manage This Unsigned Work.     Thank you for allowing pharmacy to be a part of this patient's care.  Georgina Pillion, PharmD, BCPS, BCIDP Infectious Diseases Clinical Pharmacist 06/12/2023 12:29 PM   **Pharmacist phone directory can now be found on [email or web address] ([email or web address]).  Listed under [facility].

## 2023-06-12 NOTE — Progress Notes (Signed)
ANTICOAGULATION CONSULT NOTE - Initial Consult  Pharmacy Consult for apixaban  Indication: atrial fibrillation  Allergies  Allergen Reactions   Sglt2 Inhibitors Other (See Comments)    Fournier's gangrene    Patient Measurements: Height: 6' 2.02" (188 cm) Weight: 111.7 kg (246 lb 4.1 oz) IBW/kg (Calculated) : 82.24   Vital Signs: Temp: 98.5 F (36.9 C) (08/22 0842) Temp Source: Oral (08/22 0842) BP: 113/57 (08/22 0842) Pulse Rate: 88 (08/22 0842)  Labs: Recent Labs    06/09/23 2352 06/11/23 0054 06/12/23 1159  HGB 9.1* 10.7*  --   HCT 29.9* 35.6*  --   PLT 161 135*  --   CREATININE 0.82 0.84 0.71    Estimated Creatinine Clearance: 120.8 mL/min (by C-G formula based on SCr of 0.71 mg/dL).   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 Overview:  Added automatically from request for surgery [id number] Formatting of this note might be different from the original. Added automatically from request for surgery [id number]   Large bowel perforation (HCC) 09/27/2015   Malignant neoplasm of prostate (HCC) 10/22/2019   Myopia with astigmatism and presbyopia, bilateral 06/15/2018   Last Assessment & Plan:  Formatting of this note  might be different from the original. - Wrx printed Formatting of this note might be different from the original. 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 - Stable, monitor Formatting of this note might be different from the original. Last Assessment & Plan:  - Stable, monitor   Retroperitoneal abscess (HCC) 10/02/2015   Status post corneal transplant 10/07/2016   Formatting of this note might be different from the original. Overview:  Added automatically from request for surgery [id number] Formatting of this note might be different from the original. Added automatically from request for surgery [id number]  Last Assessment & Plan:  Formatting of this note might be different from the original. - OD, 2/2 KCN - Stable, no NVK / rejection - Continue off steroids - Mo   Thoracic ascending aortic aneurysm (HCC)    TIA (transient ischemic attack) 2012   Type 2 diabetes mellitus [facility])       Assessment: 67 yo M on apixaban PTA for afib, which has been held for possible procedures. No further procedures planned, pharmacy consulted for apixaban.  Goal of Therapy:  Monitor platelets by anticoagulation protocol: Yes   Plan:  Apixaban 5mg  bid Monitor signs/symptoms of bleeding   Alphia Moh, PharmD, BCPS, BCCP  Clinical Pharmacist  Please check AMION for all [facility] Pharmacy phone numbers After 10:00 PM, call Main Pharmacy [phone]

## 2023-06-12 NOTE — TOC Progression Note (Signed)
Transition of Care [place]) - Progression Note    Patient Details  Name: Peter Yang MRN: [id number] Date of Birth: 03-04-1956  Transition of Care [facility]) CM/SW Contact  Carley Hammed, LCSW Phone Number: 06/12/2023, 12:16 PM  Clinical Narrative:    CSW continues to follow pt for DC planning. Pt is currently not medically ready, per medical team. Facility updated to continued barriers to DC. TOC will continue to follow.    Expected Discharge Plan: Skilled Nursing Facility Barriers to Discharge: Continued Medical Work up  Expected Discharge Plan and Services     Post Acute Care Choice: Skilled Nursing Facility Living arrangements for the past 2 months: Skilled Nursing Facility                                       Social Determinants of Health (SDOH) Interventions SDOH Screenings   Food Insecurity: No Food Insecurity (06/04/2023)  Housing: Low Risk  (06/04/2023)  Transportation Needs: No Transportation Needs (06/04/2023)  Utilities: Not At Risk (06/04/2023)  Financial Resource Strain: Low Risk  (08/13/2018)   Received from [facility] visits prior to 12/21/2022., [facility] visits prior to 12/21/2022.  Tobacco Use: Low Risk  (06/08/2023)    Readmission Risk Interventions    05/07/2023    4:42 PM 10/24/2022   11:21 AM 09/02/2022   10:36 AM  Readmission Risk Prevention Plan  Transportation Screening Complete Complete Complete  Medication Review [facility])  Complete Complete  PCP or Specialist appointment within 3-5 days of discharge Complete Complete Complete  HRI or Home Care Consult Complete Complete Complete  SW Recovery Care/Counseling Consult Complete Complete Complete  Palliative Care Screening Not Applicable Not Applicable Not Applicable  Skilled Nursing Facility Complete Complete Complete

## 2023-06-13 DIAGNOSIS — E1151 Type 2 diabetes mellitus with diabetic peripheral angiopathy without gangrene: Secondary | ICD-10-CM | POA: Diagnosis not present

## 2023-06-13 DIAGNOSIS — J90 Pleural effusion, not elsewhere classified: Secondary | ICD-10-CM | POA: Diagnosis not present

## 2023-06-13 DIAGNOSIS — I480 Paroxysmal atrial fibrillation: Secondary | ICD-10-CM | POA: Diagnosis not present

## 2023-06-13 DIAGNOSIS — R310 Gross hematuria: Secondary | ICD-10-CM | POA: Diagnosis not present

## 2023-06-13 LAB — BASIC METABOLIC PANEL
Anion gap: 10 (ref 5–15)
BUN: 30 mg/dL — ABNORMAL HIGH (ref 8–23)
CO2: 27 mmol/L (ref 22–32)
Calcium: 8.4 mg/dL — ABNORMAL LOW (ref 8.9–10.3)
Chloride: 94 mmol/L — ABNORMAL LOW (ref 98–111)
Creatinine, Ser: 0.69 mg/dL (ref 0.61–1.24)
GFR, Estimated: >60 mL/min (ref >60)
Glucose, Bld: 161 mg/dL — ABNORMAL HIGH (ref 70–99)
Potassium: 3.4 mmol/L — ABNORMAL LOW (ref 3.5–5.1)
Sodium: 131 mmol/L — ABNORMAL LOW (ref 135–145)

## 2023-06-13 LAB — GLUCOSE, CAPILLARY
Glucose-Capillary: 166 mg/dL — ABNORMAL HIGH (ref 70–99)
Glucose-Capillary: 177 mg/dL — ABNORMAL HIGH (ref 70–99)
Glucose-Capillary: 229 mg/dL — ABNORMAL HIGH (ref 70–99)
Glucose-Capillary: 204 mg/dL — ABNORMAL HIGH (ref 70–99)

## 2023-06-13 MED ORDER — SODIUM CHLORIDE 0.9% FLUSH: 10.0000 mL | INTRAVENOUS | Status: DC | PRN

## 2023-06-13 MED ORDER — METRONIDAZOLE 500 MG PO TABS
500.0000 mg | ORAL_TABLET | Freq: Two times a day (BID) | ORAL | Status: DC
  Administered 2023-06-13 – 2023-06-19 (×13): 500 mg via ORAL
  Filled 2023-06-13 (×13): qty 1

## 2023-06-13 MED ORDER — POTASSIUM CHLORIDE CRYS ER 20 MEQ PO TBCR
40.0000 meq | EXTENDED_RELEASE_TABLET | Freq: Once | ORAL | Status: AC
  Administered 2023-06-13: 40 meq via ORAL
  Filled 2023-06-13: qty 2

## 2023-06-13 MED ORDER — POTASSIUM CHLORIDE CRYS ER 20 MEQ PO TBCR
20.0000 meq | EXTENDED_RELEASE_TABLET | Freq: Once | ORAL | Status: AC
  Administered 2023-06-13: 20 meq via ORAL

## 2023-06-13 NOTE — TOC Progression Note (Signed)
Transition of Care [facility]) - Progression Note    Patient Details  Name: Peter Yang MRN: [id number] Date of Birth: 06-06-1956  Transition of Care [facility]) CM/SW Contact  Carley Hammed, LCSW Phone Number: 06/13/2023, 12:50 PM  Clinical Narrative:     CSW was notified that pt's infection is requiring an antibiotic called ceftazidime/avibactam Pricilla Holm) - dosing 2.5g IV every 8 hours. CSW confirmed with facility they can provide this medication. Facility updated and they continue to follow to accept pt back when medically stable. TOC will continue to follow.   Expected Discharge Plan: Skilled Nursing Facility Barriers to Discharge: Continued Medical Work up  Expected Discharge Plan and Services     Post Acute Care Choice: Skilled Nursing Facility Living arrangements for the past 2 months: Skilled Nursing Facility                                       Social Determinants of Health (SDOH) Interventions SDOH Screenings   Food Insecurity: No Food Insecurity (06/04/2023)  Housing: Low Risk  (06/04/2023)  Transportation Needs: No Transportation Needs (06/04/2023)  Utilities: Not At Risk (06/04/2023)  Financial Resource Strain: Low Risk  (08/13/2018)   Received from [facility] visits prior to 12/21/2022., [facility] visits prior to 12/21/2022.  Tobacco Use: Low Risk  (06/08/2023)    Readmission Risk Interventions    05/07/2023    4:42 PM 10/24/2022   11:21 AM 09/02/2022   10:36 AM  Readmission Risk Prevention Plan  Transportation Screening Complete Complete Complete  Medication Review Oceanographer)  Complete Complete  PCP or Specialist appointment within 3-5 days of discharge Complete Complete Complete  HRI or Home Care Consult Complete Complete Complete  SW Recovery Care/Counseling Consult Complete Complete Complete  Palliative Care Screening Not Applicable Not Applicable Not Applicable  Skilled Nursing Facility Complete Complete  Complete

## 2023-06-13 NOTE — Progress Notes (Signed)
Pt would benefit from Interdry Ag in the groin folds which are reddened and yeasty. Ordered Interdry Ag for area after OK'ing it with Elmon Kirschner, NP.

## 2023-06-13 NOTE — Progress Notes (Signed)
OT Cancellation Note  Patient Details Name: Peter Yang MRN: [id number] DOB: 07-25-1956   Cancelled Treatment:    Reason Eval/Treat Not Completed: OT screened, no needs identified, will sign off. Pt with longstanding dependence in self care. Requires mechanical lift for OOB. Pt is a resident of SNF. No acute OT needs.   Evern Bio 06/13/2023, 1:39 PM Berna Spare, OTR/L Acute Rehabilitation Services Office: [phone]

## 2023-06-13 NOTE — Plan of Care (Signed)
?  Problem: Nutrition: ?Goal: Adequate nutrition will be maintained ?Outcome: Progressing ?  ?Problem: Coping: ?Goal: Level of anxiety will decrease ?Outcome: Not Progressing ?  ?Problem: Elimination: ?Goal: Will not experience complications related to bowel motility ?Outcome: Progressing ?Goal: Will not experience complications related to urinary retention ?Outcome: Progressing ?  ?

## 2023-06-13 NOTE — Progress Notes (Signed)
[facility]                                                                               Peter Yang, is a 67 y.o. male, DOB - October 03, 1956, WGN:[id number] Admit date - 06/04/2023    Outpatient Primary MD for the patient is Patient, No Pcp Per  LOS - 9  days    Brief summary    67 y.o. male with a history of T2DM with polyneuropathy, DFU s/p left BKA, CAD s/p stent, HLD, PAF, HFrEF, adrenal insufficiency, CVA, BPH with chronic foley, and recent surgeries for Fournier's gangrene June 2024 having completed IV antibiotics 7/30 who presented to the ED on 06/04/2023 after falling out of bed at [facility]. He was afebrile with dehiscence of left scrotal wound with discharge. Work up showed WBC 9.3k, ESR 4, PCT <0.10, CRP 2.9 (down from 20.7 on 8/7), lactic acid 2.1 > 1.8. IV antibiotics were given and urology consulted. Urine culture grew MDR Pseudomonas, wound culture grew MDR Klebsiella and Pseudomonas for which ID was consulted, started ceftolozane-tazobactam. 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 Suprapubic catheter placed 8/19.      Assessment & Plan    Assessment and Plan:  Acute on chronic combined heart failure, with bilateral pleural effusions in the setting of hypoalbuminemia and malnutrition CXR showed Interval improvement in aeration of the lungs since 06/04/2023.   Continue with IV lasix 40 mg BID, with good output.  Diuresed about 25 lit since admission.  He remains on RA. Possible transition to oral lasix in am.  Creatinine stable, . Replaced potassium.  Check BMP in am.   Hyponatremia:  ? Fluid overload.  Sodium between 133 to 130.   Scrotal surgical wound infection S/p debridement for Fournier's gangrene Continue with ceftazidime avibactam every 8 hours per infectious disease Blood cultures continue to be negative. Wound care as per urology. PICC line will be placed today.  Pain control with oxycodone 5 to  10 mg as needed and IV Dilaudid 0.5 mg every 4 hours as needed.   CAUTI versus asymptomatic bacteriuria  Suprapubic catheter now placed by IR 8/18.  - Urine culture with Pseudomonas. Culture with multidrug resistance but without urinary symptoms, no directed treatment for this per ID.   Hematuria: with normocytic anemia.  - Monitor CBC, hgb stable at 10g/dl.    Right foot wound: No exudate or odor at this time.  - Continue regular podiatry follow up, optimize nutritional status and glycemic control.  - Keep covered, continue daily wound care per WOC.  - Offload with prevalon boot - x rays of the foot  show Postsurgical and degenerative changes .  Recommend outpatient podiatry follow up .    Stage 2 sacral pressure injury: POA:  - Offload, foam dressing w/medihoney.    IDT2DM: Last HbA1c 7% shows adequate glycemic control to assist wound healing.  -Increase Semglee to 24 units at bedtime and sliding scale insulin. CBG (last 3)  Recent Labs    06/12/23 2107 06/13/23 0800 06/13/23 1242  GLUCAP 187* 166* 177*  No changes today.     PAF: In atrial fibrillation with  CVR.   - Restarted eliquis  as  Urology recommends exchange and dilatation at 6 weeks.   not on rate-control agent at baseline.   Tinea cruris:  - Continue nystatin powder.   Chronic pain syndrome Pain control.   Hypokalemia and hypomagnesemia Replaced.  Repeat labs in am.    Anemia of blood loss chronic.  Continue with iron supplementation with bowel regimen.    Adrenal insufficiency:  Resume cortef, midodrine .   Fall out of bed: Negative CT head, cervical spine. - Fall precautions   HLD:  - Continue statin   Depression:  - Continue escitalopram  Vitamin A deficiency:  - Supplementing - Also empirically supplement zinc and vitamin C to aid wound healing.    Malnutrition Type:  Nutrition Problem: Moderate Malnutrition Etiology: chronic illness   Malnutrition  Characteristics:  Signs/Symptoms: moderate fat depletion, moderate muscle depletion   Nutrition Interventions:  Interventions: Ensure Enlive (each supplement provides 350kcal and 20 grams of protein), MVI, Juven, Liberalize Diet  Estimated body mass index is 31.6 kg/m as calculated from the following:   Height as of this encounter: 6' 2.02" (1.88 m).   Weight as of this encounter: 111.7 kg.  Code Status: full code.  DVT Prophylaxis:  SCDs Start: 06/04/23 1157 apixaban (ELIQUIS) tablet 5 mg   Level of Care: Level of care: Telemetry Medical Family Communication: NONE AT BEDSIDE.   Procedures:   Debridement.  Consultants:   Urology Wound care  Infectious disease.  IR  Antimicrobials:   Anti-infectives (From admission, onward)    Start     Dose/Rate Route Frequency Ordered Stop   06/13/23 1000  metroNIDAZOLE (FLAGYL) tablet 500 mg        500 mg Oral Every 12 hours 06/13/23 0740     06/11/23 1400  ceftazidime-avibactam (AVYCAZ) 2.5 g in dextrose 5 % 50 mL IVPB        2.5 g 25 mL/hr over 2 Hours Intravenous Every 8 hours 06/11/23 1304     06/09/23 1600  vancomycin (VANCOREADY) IVPB 1250 mg/250 mL  Status:  Discontinued        1,250 mg 166.7 mL/hr over 90 Minutes Intravenous Every 24 hours 06/09/23 0621 06/09/23 1006   06/09/23 1400  ceftolozane-tazobactam (ZERBAXA) 3 g in sodium chloride 0.9 % 100 mL IVPB  Status:  Discontinued        3 g 122.8 mL/hr over 60 Minutes Intravenous Every 8 hours 06/09/23 0840 06/11/23 1304   06/07/23 1445  ceftolozane-tazobactam (ZERBAXA) 1.5 g in sodium chloride 0.9 % 100 mL IVPB  Status:  Discontinued        1.5 g 111.4 mL/hr over 60 Minutes Intravenous Every 8 hours 06/07/23 1352 06/09/23 0840   06/05/23 1600  vancomycin (VANCOREADY) IVPB 1250 mg/250 mL  Status:  Discontinued        1,250 mg 166.7 mL/hr over 90 Minutes Intravenous Every 12 hours 06/05/23 1442 06/09/23 0601   06/05/23 1530  ceFEPIme (MAXIPIME) 2 g in sodium chloride 0.9 %  100 mL IVPB  Status:  Discontinued        2 g 200 mL/hr over 30 Minutes Intravenous Every 8 hours 06/05/23 1442 06/07/23 1352   06/04/23 1045  vancomycin (VANCOREADY) IVPB 1500 mg/300 mL       Placed in "Followed by" Linked Group   1,500 mg 150 mL/hr over 120 Minutes Intravenous  Once 06/04/23 1032 06/05/23 0515   06/04/23 1045  vancomycin (VANCOCIN) IVPB 1000 mg/200 mL premix  Placed in "Followed by" Linked Group   1,000 mg 200 mL/hr over 60 Minutes Intravenous  Once 06/04/23 1032 06/05/23 0527   06/04/23 1030  piperacillin-tazobactam (ZOSYN) IVPB 3.375 g        3.375 g 100 mL/hr over 30 Minutes Intravenous  Once 06/04/23 1025 06/04/23 1428        Medications  Scheduled Meds:  acetaminophen  1,000 mg Oral Q8H   apixaban  5 mg Oral BID   ascorbic acid  500 mg Oral Daily   atorvastatin  40 mg Oral QPM   Chlorhexidine Gluconate Cloth  6 each Topical Daily   escitalopram  10 mg Oral Daily   feeding supplement (GLUCERNA SHAKE)  237 mL Oral TID BM   ferrous sulfate  325 mg Oral Q breakfast   finasteride  5 mg Oral Daily   furosemide  40 mg Intravenous BID   hydrocortisone  5 mg Oral BID   insulin aspart  0-15 Units Subcutaneous TID WC   insulin aspart  0-5 Units Subcutaneous QHS   insulin glargine-yfgn  24 Units Subcutaneous QHS   leptospermum manuka honey  1 Application Topical Daily   metroNIDAZOLE  500 mg Oral Q12H   midodrine  10 mg Oral TID with meals   multivitamin with minerals  1 tablet Oral Daily   nutrition supplement (JUVEN)  1 packet Oral BID BM   nystatin   Topical BID   oxyCODONE  15 mg Oral Q12H   pantoprazole  40 mg Oral Daily   potassium chloride  20 mEq Oral Once   saccharomyces boulardii  250 mg Oral BID   senna-docusate  1 tablet Oral BID   sodium chloride flush  5 mL Intracatheter Q8H   tamsulosin  0.4 mg Oral Daily   vitamin A  10,000 Units Oral Daily   zinc sulfate  220 mg Oral Daily   Continuous Infusions:  ceftazidime-avibactam (AVYCAZ)  2.5 g in dextrose 5 % 50 mL IVPB 2.5 g (06/13/23 1422)   PRN Meds:.acetaminophen **OR** acetaminophen, albuterol, HYDROmorphone (DILAUDID) injection, oxyCODONE, sodium chloride flush    Subjective:   Peter Yang was seen and examined today.   Reports persistent pain in the groin area. Pain controlled with IV dilaudid.   Objective:   Vitals:   06/12/23 2110 06/13/23 0331 06/13/23 0722 06/13/23 1542  BP: 118/65 106/70 103/66 108/76  Pulse: 82 86 86 99  Resp: 17 18 16    Temp: 98.9 F (37.2 C) 99.3 F (37.4 C) 98.4 F (36.9 C) 98.1 F (36.7 C)  TempSrc: Oral Oral Oral Oral  SpO2: 98% 98% 99% 99%  Weight:      Height:        Intake/Output Summary (Last 24 hours) at 06/13/2023 1659 Last data filed at 06/13/2023 1517 Gross per 24 hour  Intake 773 ml  Output 4350 ml  Net -3577 ml   Filed Weights   06/09/23 0417 06/10/23 0500 06/12/23 0500  Weight: 114.6 kg 111.7 kg 111.7 kg     Exam General exam: Appears calm and comfortable  Respiratory system: Clear to auscultation. Respiratory effort normal. Cardiovascular system: S1 & S2 heard, RRR.  Gastrointestinal system: Abdomen is nondistended, soft and nontender. Central nervous system: Alert and oriented.  Bed bound Extremities: pedal edema. Improving.  Skin: right foot wound.  Psychiatry: flat affect.       Data Reviewed:  I have personally reviewed following labs and imaging studies   CBC Lab Results  Component Value Date  WBC 9.3 06/11/2023   RBC 4.08 (L) 06/11/2023   HGB 10.7 (L) 06/11/2023   HCT 35.6 (L) 06/11/2023   MCV 87.3 06/11/2023   MCH 26.2 06/11/2023   PLT 135 (L) 06/11/2023   MCHC 30.1 06/11/2023   RDW 23.9 (H) 06/11/2023   LYMPHSABS 1.1 06/04/2023   MONOABS 0.5 06/04/2023   EOSABS 0.6 (H) 06/04/2023   BASOSABS 0.0 06/04/2023     Last metabolic panel Lab Results  Component Value Date   NA 131 (L) 06/13/2023   K 3.4 (L) 06/13/2023   CL 94 (L) 06/13/2023   CO2 27 06/13/2023   BUN 30  (H) 06/13/2023   CREATININE 0.69 06/13/2023   GLUCOSE 161 (H) 06/13/2023   GFRNONAA >60 06/13/2023   CALCIUM 8.4 (L) 06/13/2023   PHOS 3.2 05/07/2023   PROT 5.0 (L) 06/05/2023   ALBUMIN 2.5 (L) 06/05/2023   BILITOT 1.0 06/05/2023   ALKPHOS 68 06/05/2023   AST 16 06/05/2023   ALT 11 06/05/2023   ANIONGAP 10 06/13/2023    CBG (last 3)  Recent Labs    06/12/23 2107 06/13/23 0800 06/13/23 1242  GLUCAP 187* 166* 177*      Coagulation Profile: No results for input(s): "INR", "PROTIME" in the last 168 hours.   Radiology Studies: DG Foot 2 Views Right  Result Date: 06/12/2023 CLINICAL DATA:  Infection. EXAM: RIGHT FOOT - 2 VIEW COMPARISON:  January 14, 2023. 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 Severe degenerative changes seen involving the first metatarsophalangeal joint. No acute fracture or dislocation is noted. Mild posterior calcaneal spurring is noted. Vascular calcifications are noted. IMPRESSION: Postsurgical and degenerative changes as described above. No acute abnormality seen. Electronically Signed   By: Lupita Raider M.D.   On: 06/12/2023 15:59   [place] EKG SITE RITE  Result Date: 06/12/2023 If [facility] image not attached, placement could not be confirmed due to current cardiac rhythm.      Kathlen Mody M.D. [facility] 06/13/2023, 4:59 PM  Available via Epic secure chat 7am-7pm After 7 pm, please refer to night coverage provider listed on amion.

## 2023-06-13 NOTE — Progress Notes (Signed)
Peripherally Inserted Central Catheter Placement  The IV Nurse has discussed with the patient and/or persons authorized to consent for the patient, the purpose of this procedure and the potential benefits and risks involved with this procedure.  The benefits include less needle sticks, lab draws from the catheter, and the patient may be discharged home with the catheter. Risks include, but not limited to, infection, bleeding, blood clot (thrombus formation), and puncture of an artery; nerve damage and irregular heartbeat and possibility to perform a PICC exchange if needed/ordered by physician.  Alternatives to this procedure were also discussed.  Bard Power PICC patient education guide, fact sheet on infection prevention and patient information card has been provided to patient /or left at bedside.    PICC Placement Documentation  PICC Single Lumen 06/13/23 Right Basilic 41 cm 0 cm (Active)  Indication for Insertion or Continuance of Line Prolonged intravenous therapies 06/13/23 1208  Exposed Catheter (cm) 0 cm 06/13/23 1208  Site Assessment Clean, Dry, Intact 06/13/23 1208  Line Status Flushed;Saline locked;Blood return noted 06/13/23 1208  Dressing Type Transparent;Securing device 06/13/23 1208  Dressing Status Antimicrobial disc in place;Clean, Dry, Intact 06/13/23 1208  Line Adjustment (NICU/IV Team Only) No 06/13/23 1208  Dressing Intervention New dressing;Other (Comment) 06/13/23 1208  Dressing Change Due 06/20/23 06/13/23 1208       Peter Yang 06/13/2023, 12:10 PM

## 2023-06-13 NOTE — Progress Notes (Signed)
Place Interdry Ag in the groin fold. Reposition pt in bed. Plan of care ongoing.

## 2023-06-13 NOTE — Progress Notes (Signed)
This nurse has order an overly mattress for this patient in hopes to make him comfortable and help with shifting of weight. Pt buttock is also reddened, sacrum dressing in place.

## 2023-06-13 NOTE — Progress Notes (Signed)
PT Cancellation Note  Patient Details Name: Aidean Fawcett MRN: [id number] DOB: 1955/12/01   Cancelled Treatment:    Reason Eval/Treat Not Completed: PT screened, no needs identified, will sign off (Unfortunately pt appears to still be at baseline of requiring a hoyer to the chair. PT will sign off.)   Gladys Damme 06/13/2023, 9:59 AM

## 2023-06-13 NOTE — Progress Notes (Signed)
   5 Days Post-Op Subjective: Peter Yang. Alert and oriented.  Discussed outpatient follow-up again.  Objective: Vital signs in last 24 hours: Temp:  [97.9 F (36.6 C)-99.3 F (37.4 C)] 98.4 F (36.9 C) (08/23 0722) Pulse Rate:  [82-86] 86 (08/23 0722) Resp:  [16-18] 16 (08/23 0722) BP: (102-118)/(61-70) 103/66 (08/23 0722) SpO2:  [95 %-99 %] 99 % (08/23 0722)  Assessment/Plan: # Purulent drainage from incisions #urethral fistula Sutures removed in ED. There was initially a pinhole sinus draining at the bottom of the right side suture.  Would culture (+) Kleb,Pseud. Broadly covered. UC (+) Pseudomonas Taken back to OR by Dr. Annabell Howells for debridement 8/18 Signs of urethral fistula. SP Tube placed in IR 8/19. Exchange and dilate at 6 weeks with IR.  Wound care completed by me. Inadequately packed overnight, purulent drainage again. Reviewed with nursing.  If reliable wound care can be established at one of his facilities, he can discharge whenever medically stable. Patient has been seen in hospital multiple times now.  From what I can see follow-up has never been established.  I continue to request that once he is placed, a point of contact be provided so that I can have my office schedule him an appointment.   #hydronephrosis Chronic in nature.  This has been noted on multiple CT images after adequate time provided for urinary decompression. Preserved renal function    Intake/Output from previous day: 08/22 0701 - 08/23 0700 In: 1074.8 [P.O.:840; IV Piggyback:234.8] Out: 3625 [Urine:3625]  Intake/Output this shift: Total I/O In: -  Out: 1375 [Urine:1375]  Physical Exam:  General: Alert and oriented CV: No cyanosis Lungs: equal chest rise Abdomen: Soft, NTND, no rebound or guarding Skin: bilateral scrotal incisions. Red beefy pink wound beds. No purulent drainage. Gu: Foley removed, SPT with hematuria, catheter not secured.   Lab Results: Recent Labs    06/11/23 0054  HGB  10.7*  HCT 35.6*   BMET Recent Labs    06/12/23 1159 06/13/23 0327  NA 132* 131*  K 2.9* 3.4*  CL 97* 94*  CO2 25 27  GLUCOSE 186* 161*  BUN 30* 30*  CREATININE 0.71 0.69  CALCIUM 8.3* 8.4*     Studies/Results: DG Foot 2 Views Right  Result Date: 06/12/2023 CLINICAL DATA:  Infection. EXAM: RIGHT FOOT - 2 VIEW COMPARISON:  January 14, 2023. 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 Severe degenerative changes seen involving the first metatarsophalangeal joint. No acute fracture or dislocation is noted. Mild posterior calcaneal spurring is noted. Vascular calcifications are noted. IMPRESSION: Postsurgical and degenerative changes as described above. No acute abnormality seen. Electronically Signed   By: Lupita Raider M.D.   On: 06/12/2023 15:59   [place] EKG SITE RITE  Result Date: 06/12/2023 If [facility] image not attached, placement could not be confirmed due to current cardiac rhythm.     LOS: 9 days   Elmon Kirschner, NP [facility] Pager: [phone]  06/13/2023, 2:17 PM

## 2023-06-14 DIAGNOSIS — J90 Pleural effusion, not elsewhere classified: Secondary | ICD-10-CM | POA: Diagnosis not present

## 2023-06-14 DIAGNOSIS — I480 Paroxysmal atrial fibrillation: Secondary | ICD-10-CM | POA: Diagnosis not present

## 2023-06-14 DIAGNOSIS — R310 Gross hematuria: Secondary | ICD-10-CM | POA: Diagnosis not present

## 2023-06-14 DIAGNOSIS — E1151 Type 2 diabetes mellitus with diabetic peripheral angiopathy without gangrene: Secondary | ICD-10-CM | POA: Diagnosis not present

## 2023-06-14 LAB — GLUCOSE, CAPILLARY
Glucose-Capillary: 111 mg/dL — ABNORMAL HIGH (ref 70–99)
Glucose-Capillary: 114 mg/dL — ABNORMAL HIGH (ref 70–99)
Glucose-Capillary: 180 mg/dL — ABNORMAL HIGH (ref 70–99)
Glucose-Capillary: 189 mg/dL — ABNORMAL HIGH (ref 70–99)

## 2023-06-14 LAB — CBC WITH DIFFERENTIAL/PLATELET
Abs Immature Granulocytes: 0.04 10*3/uL (ref 0.00–0.07)
Basophils Absolute: 0.1 10*3/uL (ref 0.0–0.1)
Basophils Relative: 1 %
Eosinophils Absolute: 1 10*3/uL — ABNORMAL HIGH (ref 0.0–0.5)
Eosinophils Relative: 10 %
HCT: 30 % — ABNORMAL LOW (ref 39.0–52.0)
Hemoglobin: 9.4 g/dL — ABNORMAL LOW (ref 13.0–17.0)
Immature Granulocytes: 0 %
Lymphocytes Relative: 15 %
Lymphs Abs: 1.4 10*3/uL (ref 0.7–4.0)
MCH: 26.6 pg (ref 26.0–34.0)
MCHC: 31.3 g/dL (ref 30.0–36.0)
MCV: 84.7 fL (ref 80.0–100.0)
Monocytes Absolute: 1.2 10*3/uL — ABNORMAL HIGH (ref 0.1–1.0)
Monocytes Relative: 13 %
Neutro Abs: 5.7 10*3/uL (ref 1.7–7.7)
Neutrophils Relative %: 61 %
Platelets: 165 10*3/uL (ref 150–400)
RBC: 3.54 MIL/uL — ABNORMAL LOW (ref 4.22–5.81)
RDW: 22.7 % — ABNORMAL HIGH (ref 11.5–15.5)
WBC: 9.3 10*3/uL (ref 4.0–10.5)
nRBC: 0 % (ref 0.0–0.2)

## 2023-06-14 LAB — BASIC METABOLIC PANEL
Anion gap: 8 (ref 5–15)
BUN: 34 mg/dL — ABNORMAL HIGH (ref 8–23)
CO2: 28 mmol/L (ref 22–32)
Calcium: 8.1 mg/dL — ABNORMAL LOW (ref 8.9–10.3)
Chloride: 92 mmol/L — ABNORMAL LOW (ref 98–111)
Creatinine, Ser: 0.95 mg/dL (ref 0.61–1.24)
GFR, Estimated: >60 mL/min (ref >60)
Glucose, Bld: 140 mg/dL — ABNORMAL HIGH (ref 70–99)
Potassium: 3.5 mmol/L (ref 3.5–5.1)
Sodium: 128 mmol/L — ABNORMAL LOW (ref 135–145)

## 2023-06-14 LAB — MAGNESIUM: Magnesium: 1.6 mg/dL — ABNORMAL LOW (ref 1.7–2.4)

## 2023-06-14 LAB — AEROBIC/ANAEROBIC CULTURE W GRAM STAIN (SURGICAL/DEEP WOUND)

## 2023-06-14 MED ORDER — MAGNESIUM SULFATE 2 GM/50ML IV SOLN
2.0000 g | Freq: Once | INTRAVENOUS | Status: AC
  Administered 2023-06-14: 2 g via INTRAVENOUS
  Filled 2023-06-14: qty 50

## 2023-06-14 MED ORDER — POTASSIUM CHLORIDE CRYS ER 20 MEQ PO TBCR
40.0000 meq | EXTENDED_RELEASE_TABLET | Freq: Once | ORAL | Status: AC
  Administered 2023-06-14: 40 meq via ORAL
  Filled 2023-06-14: qty 2

## 2023-06-14 MED ORDER — FUROSEMIDE 40 MG PO TABS
40.0000 mg | ORAL_TABLET | Freq: Every day | ORAL | Status: DC
  Administered 2023-06-15 – 2023-06-17 (×3): 40 mg via ORAL
  Filled 2023-06-14 (×3): qty 1

## 2023-06-14 NOTE — Progress Notes (Signed)
S: Urology reconsulted re: bloody drainage from the suprapubic tube and some bloody urine per urethra and the scrotal wounds. Sandon without complaint.  No painful inability to void.  O: He is in no acute distress.  Napping but easily arousable. Abdomen soft and nontender, SP tube in place.  Pink serous drainage on dressings over scrotum.    The SP tube was irrigated.  It irrigated to pink and irrigated to equal return.  Assessment/plan: #urethral fistula Sutures removed in ED. There was initially a pinhole sinus draining at the bottom of the right side suture.  Would culture (+) Kleb,Pseud. Broadly covered. UC (+) Pseudomonas Taken back to OR by Dr. Annabell Howells for debridement 8/18 Signs of urethral fistula. SP Tube placed in IR 8/19. Exchange and dilate at 6 weeks with IR. Consider replacement of Foley catheter.  Continue suprapubic tube and flush as needed.

## 2023-06-14 NOTE — Progress Notes (Signed)
[facility]                                                                               Adamo Merrithew, is a 67 y.o. male, DOB - 1956/04/13, GEX:[id number] Admit date - 06/04/2023    Outpatient Primary MD for the patient is Patient, No Pcp Per  LOS - 10  days    Brief summary    67 y.o. male with a history of T2DM with polyneuropathy, DFU s/p left BKA, CAD s/p stent, HLD, PAF, HFrEF, adrenal insufficiency, CVA, BPH with chronic foley, and recent surgeries for Fournier's gangrene June 2024 having completed IV antibiotics 7/30 who presented to the ED on 06/04/2023 after falling out of bed at [facility]. He was afebrile with dehiscence of left scrotal wound with discharge. Work up showed WBC 9.3k, ESR 4, PCT <0.10, CRP 2.9 (down from 20.7 on 8/7), lactic acid 2.1 > 1.8. IV antibiotics were given and urology consulted. Urine culture grew MDR Pseudomonas, wound culture grew MDR Klebsiella and Pseudomonas for which ID was consulted, started ceftolozane-tazobactam. 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 Suprapubic catheter placed 8/19.      Assessment & Plan    Assessment and Plan:  Acute on chronic combined heart failure, with bilateral pleural effusions in the setting of hypoalbuminemia and malnutrition CXR showed Interval improvement in aeration of the lungs since 06/04/2023.  He was started on IV lasix 40 mg BID, with good output.  Diuresed about 27 lit since admission.  He remains on RA. Transitioned to oral lasix today.  Creatinine stable, . Replaced potassium.  Check BMP in am.   Hyponatremia:  Slight worsening to 128 from over diuresis. Hold lasix today and recheck BMP in am.   Scrotal surgical wound infection S/p debridement for Fournier's gangrene Continue with ceftazidime avibactam every 8 hours per infectious disease Blood cultures continue to be negative. Wound care as per urology. PICC line placed and possible  d.c to SNF on Monday if pain is well controlled with oral meds.  Pain control with oxycodone 5 to 10 mg as needed and IV Dilaudid 0.5 mg every 4 hours as needed.   CAUTI versus asymptomatic bacteriuria  Suprapubic catheter now placed by IR 8/18.  - Urine culture with Pseudomonas. Culture with multidrug resistance but without urinary symptoms, no directed treatment for this per ID.   Hematuria: with normocytic anemia.  Hemoglobin dropped to 9.4 today. Monitor.   Anemia of chronic blood loss  Hemoglobin around 9.4 today.    Right foot wound: No exudate or odor at this time.  - Continue regular podiatry follow up, optimize nutritional status and glycemic control.  - Keep covered, continue daily wound care per WOC.  - Offload with prevalon boot - x rays of the foot  show Postsurgical and degenerative changes .  Recommend outpatient podiatry follow up .    Stage 2 sacral pressure injury: POA:  - Offload, foam dressing w/medihoney.    IDT2DM: Last HbA1c 7% shows adequate glycemic control to assist wound healing.  -Increase Semglee to 24 units at bedtime and sliding scale insulin. CBG (last 3)  Recent  Labs    06/14/23 0953 06/14/23 1217 06/14/23 1653  GLUCAP 111* 114* 180*  No changes in meds today.    PAF: In atrial fibrillation with CVR.   - Restarted eliquis  as  Urology recommends exchange and dilatation at 6 weeks.   not on rate-control agent at baseline.   Tinea cruris:  - Continue nystatin powder.   Chronic pain syndrome Pain control.   Hypokalemia and hypomagnesemia Replaced.      Adrenal insufficiency:  Resume cortef, midodrine .   Fall out of bed: Negative CT head, cervical spine. - Fall precautions   HLD:  - Continue statin   Depression:  - Continue escitalopram  Vitamin A deficiency:  - Supplementing - Also empirically supplement zinc and vitamin C to aid wound healing.    Malnutrition Type:  Nutrition Problem: Moderate  Malnutrition Etiology: chronic illness   Malnutrition Characteristics:  Signs/Symptoms: moderate fat depletion, moderate muscle depletion   Nutrition Interventions:  Interventions: Ensure Enlive (each supplement provides 350kcal and 20 grams of protein), MVI, Juven, Liberalize Diet  Estimated body mass index is 31.01 kg/m as calculated from the following:   Height as of this encounter: 6' 2.02" (1.88 m).   Weight as of this encounter: 109.6 kg.  Code Status: full code.  DVT Prophylaxis:  SCDs Start: 06/04/23 1157 apixaban (ELIQUIS) tablet 5 mg   Level of Care: Level of care: Telemetry Medical Family Communication: NONE AT BEDSIDE.   Procedures:   Debridement.  Consultants:   Urology Wound care  Infectious disease.  IR  Antimicrobials:   Anti-infectives (From admission, onward)    Start     Dose/Rate Route Frequency Ordered Stop   06/13/23 1000  metroNIDAZOLE (FLAGYL) tablet 500 mg        500 mg Oral Every 12 hours 06/13/23 0740     06/11/23 1400  ceftazidime-avibactam (AVYCAZ) 2.5 g in dextrose 5 % 50 mL IVPB        2.5 g 25 mL/hr over 2 Hours Intravenous Every 8 hours 06/11/23 1304     06/09/23 1600  vancomycin (VANCOREADY) IVPB 1250 mg/250 mL  Status:  Discontinued        1,250 mg 166.7 mL/hr over 90 Minutes Intravenous Every 24 hours 06/09/23 0621 06/09/23 1006   06/09/23 1400  ceftolozane-tazobactam (ZERBAXA) 3 g in sodium chloride 0.9 % 100 mL IVPB  Status:  Discontinued        3 g 122.8 mL/hr over 60 Minutes Intravenous Every 8 hours 06/09/23 0840 06/11/23 1304   06/07/23 1445  ceftolozane-tazobactam (ZERBAXA) 1.5 g in sodium chloride 0.9 % 100 mL IVPB  Status:  Discontinued        1.5 g 111.4 mL/hr over 60 Minutes Intravenous Every 8 hours 06/07/23 1352 06/09/23 0840   06/05/23 1600  vancomycin (VANCOREADY) IVPB 1250 mg/250 mL  Status:  Discontinued        1,250 mg 166.7 mL/hr over 90 Minutes Intravenous Every 12 hours 06/05/23 1442 06/09/23 0601    06/05/23 1530  ceFEPIme (MAXIPIME) 2 g in sodium chloride 0.9 % 100 mL IVPB  Status:  Discontinued        2 g 200 mL/hr over 30 Minutes Intravenous Every 8 hours 06/05/23 1442 06/07/23 1352   06/04/23 1045  vancomycin (VANCOREADY) IVPB 1500 mg/300 mL       Placed in "Followed by" Linked Group   1,500 mg 150 mL/hr over 120 Minutes Intravenous  Once 06/04/23 1032 06/05/23 0515   06/04/23 1045  vancomycin (VANCOCIN) IVPB 1000 mg/200 mL premix       Placed in "Followed by" Linked Group   1,000 mg 200 mL/hr over 60 Minutes Intravenous  Once 06/04/23 1032 06/05/23 0527   06/04/23 1030  piperacillin-tazobactam (ZOSYN) IVPB 3.375 g        3.375 g 100 mL/hr over 30 Minutes Intravenous  Once 06/04/23 1025 06/04/23 1428        Medications  Scheduled Meds:  acetaminophen  1,000 mg Oral Q8H   apixaban  5 mg Oral BID   ascorbic acid  500 mg Oral Daily   atorvastatin  40 mg Oral QPM   Chlorhexidine Gluconate Cloth  6 each Topical Daily   escitalopram  10 mg Oral Daily   feeding supplement (GLUCERNA SHAKE)  237 mL Oral TID BM   ferrous sulfate  325 mg Oral Q breakfast   finasteride  5 mg Oral Daily   [START ON 06/15/2023] furosemide  40 mg Oral Daily   hydrocortisone  5 mg Oral BID   insulin aspart  0-15 Units Subcutaneous TID WC   insulin aspart  0-5 Units Subcutaneous QHS   insulin glargine-yfgn  24 Units Subcutaneous QHS   leptospermum manuka honey  1 Application Topical Daily   metroNIDAZOLE  500 mg Oral Q12H   midodrine  10 mg Oral TID with meals   multivitamin with minerals  1 tablet Oral Daily   nutrition supplement (JUVEN)  1 packet Oral BID BM   nystatin   Topical BID   oxyCODONE  15 mg Oral Q12H   pantoprazole  40 mg Oral Daily   saccharomyces boulardii  250 mg Oral BID   senna-docusate  1 tablet Oral BID   sodium chloride flush  5 mL Intracatheter Q8H   tamsulosin  0.4 mg Oral Daily   vitamin A  10,000 Units Oral Daily   zinc sulfate  220 mg Oral Daily   Continuous  Infusions:  ceftazidime-avibactam (AVYCAZ) 2.5 g in dextrose 5 % 50 mL IVPB 2.5 g (06/14/23 1516)   PRN Meds:.acetaminophen **OR** acetaminophen, albuterol, HYDROmorphone (DILAUDID) injection, oxyCODONE, sodium chloride flush    Subjective:   Peter Yang was seen and examined today.   Severe pain in the groin area last night with clots in the foley, improved today.  No chest pain or sob. No nausea or vomiting.   Objective:   Vitals:   06/14/23 0319 06/14/23 0405 06/14/23 0955 06/14/23 1500  BP: 124/77   90/65  Pulse: 86  98   Resp: (!) 22  17   Temp: (!) 97.5 F (36.4 C)  98.4 F (36.9 C) 98.2 F (36.8 C)  TempSrc: Oral  Oral Oral  SpO2: 97%   96%  Weight:  109.6 kg    Height:        Intake/Output Summary (Last 24 hours) at 06/14/2023 1824 Last data filed at 06/14/2023 1519 Gross per 24 hour  Intake 623.62 ml  Output 1850 ml  Net -1226.38 ml   Filed Weights   06/10/23 0500 06/12/23 0500 06/14/23 0405  Weight: 111.7 kg 111.7 kg 109.6 kg     Exam General exam: Appears calm and comfortable  Respiratory system: Clear to auscultation. Respiratory effort normal. Cardiovascular system: S1 & S2 heard, RRR. Gastrointestinal system: Abdomen is nondistended, soft and nontender.  Central nervous system: Alert and oriented. Extremities: foot wound wrapped in bandage Skin: No rashes,  Psychiatry:  Mood & affect appropriate.      Data Reviewed:  I  have personally reviewed following labs and imaging studies   CBC Lab Results  Component Value Date   WBC 9.3 06/14/2023   RBC 3.54 (L) 06/14/2023   HGB 9.4 (L) 06/14/2023   HCT 30.0 (L) 06/14/2023   MCV 84.7 06/14/2023   MCH 26.6 06/14/2023   PLT 165 06/14/2023   MCHC 31.3 06/14/2023   RDW 22.7 (H) 06/14/2023   LYMPHSABS 1.4 06/14/2023   MONOABS 1.2 (H) 06/14/2023   EOSABS 1.0 (H) 06/14/2023   BASOSABS 0.1 06/14/2023     Last metabolic panel Lab Results  Component Value Date   NA 128 (L) 06/14/2023   K 3.5  06/14/2023   CL 92 (L) 06/14/2023   CO2 28 06/14/2023   BUN 34 (H) 06/14/2023   CREATININE 0.95 06/14/2023   GLUCOSE 140 (H) 06/14/2023   GFRNONAA >60 06/14/2023   CALCIUM 8.1 (L) 06/14/2023   PHOS 3.2 05/07/2023   PROT 5.0 (L) 06/05/2023   ALBUMIN 2.5 (L) 06/05/2023   BILITOT 1.0 06/05/2023   ALKPHOS 68 06/05/2023   AST 16 06/05/2023   ALT 11 06/05/2023   ANIONGAP 8 06/14/2023    CBG (last 3)  Recent Labs    06/14/23 0953 06/14/23 1217 06/14/23 1653  GLUCAP 111* 114* 180*      Coagulation Profile: No results for input(s): "INR", "PROTIME" in the last 168 hours.   Radiology Studies: No results found.     Kathlen Mody M.D. [facility] 06/14/2023, 6:24 PM  Available via Epic secure chat 7am-7pm After 7 pm, please refer to night coverage provider listed on amion.

## 2023-06-14 NOTE — Progress Notes (Addendum)
Pharmacy Antibiotic Note  Peter Yang is a 67 y.o. male admitted on 06/04/2023 with scrotal wound infection/fournier's gangrene with tissue/wound cultures growing MDR Psuedomonas - added sensitivities show susceptibility to Avycaz which will cover both the PsA and Kleb PNA isolated in cultures. Pharmacy has been consulted for Avycaz dosing.  8/24 Assessment: Patient continues on Avycaz 2.5 g q8h + Metronidazole 500 mg q12h. Dose remains appropriate for indication and renal function (CrCl 100.8, SCr 0.95). Current planned EOT: 9/7 per infectious disease. PICC line placed yesterday, 8/23 for outpatient antimicrobials.  Plan: - Continue Ceftazidime/avibactam (Avycaz) 2.5g IV every 8 hours - Will continue to follow renal function and clinical status  Height: 6' 2.02" (188 cm) Weight: 109.6 kg (241 lb 10 oz) IBW/kg (Calculated) : 82.24  Temp (24hrs), Avg:97.9 F (36.6 C), Min:97.5 F (36.4 C), Max:98.1 F (36.7 C)  Recent Labs  Lab 06/08/23 1918 06/09/23 0500 06/09/23 2352 06/11/23 0054 06/12/23 1159 06/13/23 0327 06/14/23 0220  WBC  --  8.7 9.5 9.3  --   --  9.3  CREATININE  --  0.80 0.82 0.84 0.71 0.69 0.95  VANCOTROUGH  --  29*  --   --   --   --   --   VANCOPEAK 40  --   --   --   --   --   --     Estimated Creatinine Clearance: 100.8 mL/min (by C-G formula based on SCr of 0.95 mg/dL).    Allergies  Allergen Reactions   Sglt2 Inhibitors Other (See Comments)    Fournier's gangrene    Antimicrobials this admission: Zosyn 8/14 x 1 Vancomycin 8/15 >> 8/18 Cefepime 8/15 >> 8/17 Ceftolozane/tazobactam (Zerbaxa) 8/17 >> 8/21 Ceftazidime/avibactam (Avycaz) 8/21 >>  Metronidazole 8/23 >>  Microbiology results: 8/15 MRSA PCR >> positive 8/14 UCx >> 100k PsA (I-Ceftaz/Cipro, R Imi, S Gent) 8/14 BCx >> NGf 8/14 WCx (scrotum) >> MDR PsA (R-imipenem) + ESBL Kleb PNA + Bacteroides fragilis 8/15 MRSA Swab +  Thank you for allowing pharmacy to be a part of this patient's  care.  Lora Paula, PharmD PGY-2 Infectious Diseases Pharmacy Resident 06/14/2023 8:12 AM  **Pharmacist phone directory can now be found on [email or web address] ([email or web address]).  Listed under [facility].

## 2023-06-15 DIAGNOSIS — R310 Gross hematuria: Secondary | ICD-10-CM | POA: Diagnosis not present

## 2023-06-15 DIAGNOSIS — J90 Pleural effusion, not elsewhere classified: Secondary | ICD-10-CM | POA: Diagnosis not present

## 2023-06-15 DIAGNOSIS — I480 Paroxysmal atrial fibrillation: Secondary | ICD-10-CM | POA: Diagnosis not present

## 2023-06-15 DIAGNOSIS — E1151 Type 2 diabetes mellitus with diabetic peripheral angiopathy without gangrene: Secondary | ICD-10-CM | POA: Diagnosis not present

## 2023-06-15 LAB — CBC WITH DIFFERENTIAL/PLATELET
Abs Immature Granulocytes: 0 10*3/uL (ref 0.00–0.07)
Basophils Absolute: 0.3 10*3/uL — ABNORMAL HIGH (ref 0.0–0.1)
Basophils Relative: 4 %
Eosinophils Absolute: 0.5 10*3/uL (ref 0.0–0.5)
Eosinophils Relative: 7 %
HCT: 30.2 % — ABNORMAL LOW (ref 39.0–52.0)
Hemoglobin: 9.5 g/dL — ABNORMAL LOW (ref 13.0–17.0)
Lymphocytes Relative: 13 %
Lymphs Abs: 0.9 10*3/uL (ref 0.7–4.0)
MCH: 27.2 pg (ref 26.0–34.0)
MCHC: 31.5 g/dL (ref 30.0–36.0)
MCV: 86.5 fL (ref 80.0–100.0)
Monocytes Absolute: 0.4 10*3/uL (ref 0.1–1.0)
Monocytes Relative: 5 %
Neutro Abs: 5 10*3/uL (ref 1.7–7.7)
Neutrophils Relative %: 71 %
Platelets: 138 10*3/uL — ABNORMAL LOW (ref 150–400)
RBC: 3.49 MIL/uL — ABNORMAL LOW (ref 4.22–5.81)
RDW: 23 % — ABNORMAL HIGH (ref 11.5–15.5)
WBC: 7 10*3/uL (ref 4.0–10.5)
nRBC: 0 % (ref 0.0–0.2)
nRBC: 0 /100{WBCs}

## 2023-06-15 LAB — MISC LABCORP TEST (SEND OUT): Labcorp test code: 96388

## 2023-06-15 LAB — GLUCOSE, CAPILLARY
Glucose-Capillary: 82 mg/dL (ref 70–99)
Glucose-Capillary: 146 mg/dL — ABNORMAL HIGH (ref 70–99)
Glucose-Capillary: 97 mg/dL (ref 70–99)
Glucose-Capillary: 101 mg/dL — ABNORMAL HIGH (ref 70–99)

## 2023-06-15 LAB — BASIC METABOLIC PANEL
Anion gap: 13 (ref 5–15)
BUN: 31 mg/dL — ABNORMAL HIGH (ref 8–23)
CO2: 27 mmol/L (ref 22–32)
Calcium: 8.6 mg/dL — ABNORMAL LOW (ref 8.9–10.3)
Chloride: 94 mmol/L — ABNORMAL LOW (ref 98–111)
Creatinine, Ser: 0.82 mg/dL (ref 0.61–1.24)
GFR, Estimated: >60 mL/min (ref >60)
Glucose, Bld: 142 mg/dL — ABNORMAL HIGH (ref 70–99)
Potassium: 3.8 mmol/L (ref 3.5–5.1)
Sodium: 134 mmol/L — ABNORMAL LOW (ref 135–145)

## 2023-06-15 MED ORDER — HYDROMORPHONE HCL 1 MG/ML IJ SOLN
0.5000 mg | INTRAMUSCULAR | Status: DC | PRN
  Administered 2023-06-15 – 2023-06-16 (×3): 0.5 mg via INTRAVENOUS
  Filled 2023-06-15 (×3): qty 0.5

## 2023-06-15 NOTE — Progress Notes (Signed)
[facility]                                                                               Peter Yang, is a 67 y.o. male, DOB - 02/13/1956, ION:[id number] Admit date - 06/04/2023    Outpatient Primary MD for the patient is Patient, No Pcp Per  LOS - 11  days    Brief summary    67 y.o. male with a history of T2DM with polyneuropathy, DFU s/p left BKA, CAD s/p stent, HLD, PAF, HFrEF, adrenal insufficiency, CVA, BPH with chronic foley, and recent surgeries for Fournier's gangrene June 2024 having completed IV antibiotics 7/30 who presented to the ED on 06/04/2023 after falling out of bed at [facility]. He was afebrile with dehiscence of left scrotal wound with discharge. Work up showed WBC 9.3k, ESR 4, PCT <0.10, CRP 2.9 (down from 20.7 on 8/7), lactic acid 2.1 > 1.8. IV antibiotics were given and urology consulted. Urine culture grew MDR Pseudomonas, wound culture grew MDR Klebsiella and Pseudomonas for which ID was consulted, started ceftolozane-tazobactam. 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 Suprapubic catheter placed 8/19.   Discharge back to SNF when pain is well controlled.    Assessment & Plan    Assessment and Plan:  Acute on chronic combined heart failure, with bilateral pleural effusions in the setting of hypoalbuminemia and malnutrition CXR showed Interval improvement in aeration of the lungs since 06/04/2023.  He was started on IV lasix 40 mg BID, with good output.  Diuresed about 27 lit since admission.  He remains on RA. Transitioned to oral lasix, . Creatinine remains stable .    Hyponatremia:  Much improved. Sodium is 134 today.   Scrotal surgical wound infection S/p debridement for Fournier's gangrene Pseudomonas sensitivities returned sensitive to Zerbaxa and Avycaz. Patient transitioned to Avycaz to preserve Zerbaxa in the future  Continue with ceftazidime avibactam every 8 hours per infectious  disease, plan for 3 weeks of treatment with antibiotics. End of treatment on 06/28/2023.  Blood cultures continue to be negative. Wound care as per urology. PICC line placed and possible d.c to SNF on Monday if pain is well controlled with oral meds.  Pain control with oxycodone 5 to 10 mg as needed and IV Dilaudid 0.5 mg every 3 hours as needed.   CAUTI versus asymptomatic bacteriuria  Suprapubic catheter now placed by IR 8/18.  - Urine culture with Pseudomonas. Culture with multidrug resistance but without urinary symptoms, no directed treatment for this per ID.   Hematuria: with normocytic anemia.  Hemoglobin dropped to 9.4 today. Monitor.   Anemia of chronic blood loss  Hemoglobin around 9.4 today.    Right foot wound: No exudate or odor at this time.  - Continue regular podiatry follow up, optimize nutritional status and glycemic control.  - Keep covered, continue daily wound care per WOC.  - Offload with prevalon boot - x rays of the foot  show Postsurgical and degenerative changes .  Recommend outpatient podiatry follow up .    Stage 2 sacral pressure injury: POA:  - Offload, foam dressing w/medihoney.    IDT2DM: Last  HbA1c 7% shows adequate glycemic control to assist wound healing.  -Increase Semglee to 24 units at bedtime and sliding scale insulin. CBG (last 3)  Recent Labs    06/14/23 2032 06/15/23 0742 06/15/23 1254  GLUCAP 189* 82 146*  No changes in meds today.    PAF: In atrial fibrillation with CVR.   - Restarted eliquis  as  Urology recommends exchange and dilatation at 6 weeks.   not on rate-control agent at baseline.   Tinea cruris:  - Continue nystatin powder.   Chronic pain syndrome Pain control.   Hypokalemia and hypomagnesemia Replaced.      Adrenal insufficiency:  Resume cortef, midodrine .   Fall out of bed: Negative CT head, cervical spine. - Fall precautions   HLD:  - Continue statin   Depression:  - Continue  escitalopram  Vitamin A deficiency:  - Supplementing - Also empirically supplement zinc and vitamin C to aid wound healing.    Malnutrition Type:  Nutrition Problem: Moderate Malnutrition Etiology: chronic illness   Malnutrition Characteristics:  Signs/Symptoms: moderate fat depletion, moderate muscle depletion   Nutrition Interventions:  Interventions: Ensure Enlive (each supplement provides 350kcal and 20 grams of protein), MVI, Juven, Liberalize Diet  Estimated body mass index is 31.01 kg/m as calculated from the following:   Height as of this encounter: 6' 2.02" (1.88 m).   Weight as of this encounter: 109.6 kg.  Code Status: full code.  DVT Prophylaxis:  SCDs Start: 06/04/23 1157 apixaban (ELIQUIS) tablet 5 mg   Level of Care: Level of care: Telemetry Medical Family Communication: NONE AT BEDSIDE.   Procedures:  Debridement.  Consultants:   Urology Wound care  Infectious disease.  IR  Antimicrobials:   Anti-infectives (From admission, onward)    Start     Dose/Rate Route Frequency Ordered Stop   06/13/23 1000  metroNIDAZOLE (FLAGYL) tablet 500 mg        500 mg Oral Every 12 hours 06/13/23 0740     06/11/23 1400  ceftazidime-avibactam (AVYCAZ) 2.5 g in dextrose 5 % 50 mL IVPB        2.5 g 25 mL/hr over 2 Hours Intravenous Every 8 hours 06/11/23 1304     06/09/23 1600  vancomycin (VANCOREADY) IVPB 1250 mg/250 mL  Status:  Discontinued        1,250 mg 166.7 mL/hr over 90 Minutes Intravenous Every 24 hours 06/09/23 0621 06/09/23 1006   06/09/23 1400  ceftolozane-tazobactam (ZERBAXA) 3 g in sodium chloride 0.9 % 100 mL IVPB  Status:  Discontinued        3 g 122.8 mL/hr over 60 Minutes Intravenous Every 8 hours 06/09/23 0840 06/11/23 1304   06/07/23 1445  ceftolozane-tazobactam (ZERBAXA) 1.5 g in sodium chloride 0.9 % 100 mL IVPB  Status:  Discontinued        1.5 g 111.4 mL/hr over 60 Minutes Intravenous Every 8 hours 06/07/23 1352 06/09/23 0840   06/05/23  1600  vancomycin (VANCOREADY) IVPB 1250 mg/250 mL  Status:  Discontinued        1,250 mg 166.7 mL/hr over 90 Minutes Intravenous Every 12 hours 06/05/23 1442 06/09/23 0601   06/05/23 1530  ceFEPIme (MAXIPIME) 2 g in sodium chloride 0.9 % 100 mL IVPB  Status:  Discontinued        2 g 200 mL/hr over 30 Minutes Intravenous Every 8 hours 06/05/23 1442 06/07/23 1352   06/04/23 1045  vancomycin (VANCOREADY) IVPB 1500 mg/300 mL       Placed  in "Followed by" Linked Group   1,500 mg 150 mL/hr over 120 Minutes Intravenous  Once 06/04/23 1032 06/05/23 0515   06/04/23 1045  vancomycin (VANCOCIN) IVPB 1000 mg/200 mL premix       Placed in "Followed by" Linked Group   1,000 mg 200 mL/hr over 60 Minutes Intravenous  Once 06/04/23 1032 06/05/23 0527   06/04/23 1030  piperacillin-tazobactam (ZOSYN) IVPB 3.375 g        3.375 g 100 mL/hr over 30 Minutes Intravenous  Once 06/04/23 1025 06/04/23 1428        Medications  Scheduled Meds:  acetaminophen  1,000 mg Oral Q8H   apixaban  5 mg Oral BID   ascorbic acid  500 mg Oral Daily   atorvastatin  40 mg Oral QPM   Chlorhexidine Gluconate Cloth  6 each Topical Daily   escitalopram  10 mg Oral Daily   feeding supplement (GLUCERNA SHAKE)  237 mL Oral TID BM   ferrous sulfate  325 mg Oral Q breakfast   finasteride  5 mg Oral Daily   furosemide  40 mg Oral Daily   hydrocortisone  5 mg Oral BID   insulin aspart  0-15 Units Subcutaneous TID WC   insulin aspart  0-5 Units Subcutaneous QHS   insulin glargine-yfgn  24 Units Subcutaneous QHS   leptospermum manuka honey  1 Application Topical Daily   metroNIDAZOLE  500 mg Oral Q12H   midodrine  10 mg Oral TID with meals   multivitamin with minerals  1 tablet Oral Daily   nutrition supplement (JUVEN)  1 packet Oral BID BM   nystatin   Topical BID   oxyCODONE  15 mg Oral Q12H   pantoprazole  40 mg Oral Daily   saccharomyces boulardii  250 mg Oral BID   senna-docusate  1 tablet Oral BID   sodium chloride  flush  5 mL Intracatheter Q8H   tamsulosin  0.4 mg Oral Daily   vitamin A  10,000 Units Oral Daily   zinc sulfate  220 mg Oral Daily   Continuous Infusions:  ceftazidime-avibactam (AVYCAZ) 2.5 g in dextrose 5 % 50 mL IVPB 2.5 g (06/15/23 0645)   PRN Meds:.acetaminophen **OR** acetaminophen, albuterol, HYDROmorphone (DILAUDID) injection, oxyCODONE, sodium chloride flush    Subjective:   Peter Yang was seen and examined today.   Pain controlled with meds.  Pt requesting IV dialudid.   Objective:   Vitals:   06/14/23 2035 06/15/23 0500 06/15/23 0556 06/15/23 0736  BP: 107/67  97/70 100/63  Pulse: 77  75 76  Resp: 18  16 17   Temp: 98.2 F (36.8 C)  98 F (36.7 C) 97.7 F (36.5 C)  TempSrc: Oral  Oral Oral  SpO2:   100% 99%  Weight:  109.6 kg    Height:        Intake/Output Summary (Last 24 hours) at 06/15/2023 1446 Last data filed at 06/14/2023 2000 Gross per 24 hour  Intake 724.62 ml  Output 1200 ml  Net -475.38 ml   Filed Weights   06/12/23 0500 06/14/23 0405 06/15/23 0500  Weight: 111.7 kg 109.6 kg 109.6 kg     Exam General exam: Appears calm and comfortable  Respiratory system: Clear to auscultation. Respiratory effort normal. Cardiovascular system: S1 & S2 heard, RRR. No JVD, murmurs Gastrointestinal system: Abdomen is nondistended, soft and nontender. Central nervous system: Alert and oriented. Mostly bed bound.  Extremities: right foot wound bandaged.  Skin: No rashes,  Psychiatry: Mood & affect  appropriate.       Data Reviewed:  I have personally reviewed following labs and imaging studies   CBC Lab Results  Component Value Date   WBC 7.0 06/15/2023   RBC 3.49 (L) 06/15/2023   HGB 9.5 (L) 06/15/2023   HCT 30.2 (L) 06/15/2023   MCV 86.5 06/15/2023   MCH 27.2 06/15/2023   PLT 138 (L) 06/15/2023   MCHC 31.5 06/15/2023   RDW 23.0 (H) 06/15/2023   LYMPHSABS 0.9 06/15/2023   MONOABS 0.4 06/15/2023   EOSABS 0.5 06/15/2023   BASOSABS 0.3  (H) 06/15/2023     Last metabolic panel Lab Results  Component Value Date   NA 134 (L) 06/15/2023   K 3.8 06/15/2023   CL 94 (L) 06/15/2023   CO2 27 06/15/2023   BUN 31 (H) 06/15/2023   CREATININE 0.82 06/15/2023   GLUCOSE 142 (H) 06/15/2023   GFRNONAA >60 06/15/2023   CALCIUM 8.6 (L) 06/15/2023   PHOS 3.2 05/07/2023   PROT 5.0 (L) 06/05/2023   ALBUMIN 2.5 (L) 06/05/2023   BILITOT 1.0 06/05/2023   ALKPHOS 68 06/05/2023   AST 16 06/05/2023   ALT 11 06/05/2023   ANIONGAP 13 06/15/2023    CBG (last 3)  Recent Labs    06/14/23 2032 06/15/23 0742 06/15/23 1254  GLUCAP 189* 82 146*      Coagulation Profile: No results for input(s): "INR", "PROTIME" in the last 168 hours.   Radiology Studies: No results found.     Kathlen Mody M.D. [facility] 06/15/2023, 2:46 PM  Available via Epic secure chat 7am-7pm After 7 pm, please refer to night coverage provider listed on amion.

## 2023-06-15 NOTE — Progress Notes (Signed)
Scrotal dressing changed this even with wet to dry packing applied as ordered. Left side packing significantly longer than the right side. Incision sites appear clean with no slough noted. Covered with ABD pad

## 2023-06-16 DIAGNOSIS — J90 Pleural effusion, not elsewhere classified: Secondary | ICD-10-CM | POA: Diagnosis not present

## 2023-06-16 DIAGNOSIS — R310 Gross hematuria: Secondary | ICD-10-CM | POA: Diagnosis not present

## 2023-06-16 DIAGNOSIS — E1151 Type 2 diabetes mellitus with diabetic peripheral angiopathy without gangrene: Secondary | ICD-10-CM | POA: Diagnosis not present

## 2023-06-16 DIAGNOSIS — I480 Paroxysmal atrial fibrillation: Secondary | ICD-10-CM | POA: Diagnosis not present

## 2023-06-16 LAB — GLUCOSE, CAPILLARY
Glucose-Capillary: 139 mg/dL — ABNORMAL HIGH (ref 70–99)
Glucose-Capillary: 189 mg/dL — ABNORMAL HIGH (ref 70–99)
Glucose-Capillary: 176 mg/dL — ABNORMAL HIGH (ref 70–99)
Glucose-Capillary: 179 mg/dL — ABNORMAL HIGH (ref 70–99)

## 2023-06-16 MED ORDER — OXYCODONE HCL 5 MG PO TABS
5.0000 mg | ORAL_TABLET | Freq: Four times a day (QID) | ORAL | Status: DC | PRN
  Administered 2023-06-16 – 2023-06-19 (×7): 5 mg via ORAL
  Filled 2023-06-16 (×7): qty 1

## 2023-06-16 MED ORDER — PHENAZOPYRIDINE HCL 200 MG PO TABS
200.0000 mg | ORAL_TABLET | Freq: Three times a day (TID) | ORAL | Status: DC
  Administered 2023-06-16 – 2023-06-18 (×6): 200 mg via ORAL
  Filled 2023-06-16 (×8): qty 1

## 2023-06-16 NOTE — Plan of Care (Signed)
  Problem: Pain Managment: Goal: General experience of comfort will improve Outcome: Progressing   Problem: Safety: Goal: Ability to remain free from injury will improve Outcome: Progressing   

## 2023-06-16 NOTE — Plan of Care (Signed)
  Problem: Pain Managment: Goal: General experience of comfort will improve 06/16/2023 2353 by Sammuel Cooper, RN Outcome: Progressing 06/16/2023 2343 by Sammuel Cooper, RN Outcome: Progressing   Problem: Safety: Goal: Ability to remain free from injury will improve 06/16/2023 2353 by Sammuel Cooper, RN Outcome: Progressing 06/16/2023 2343 by Sammuel Cooper, RN Outcome: Progressing

## 2023-06-16 NOTE — Care Management Important Message (Signed)
Important Message  Patient Details  Name: Motaz Albers MRN: [id number] Date of Birth: 1956/04/07   Medicare Important Message Given:  Yes     Sherilyn Banker 06/16/2023, 12:55 PM

## 2023-06-16 NOTE — TOC Progression Note (Signed)
Transition of Care [facility]) - Progression Note    Patient Details  Name: Peter Yang MRN: [id number] Date of Birth: 1956/06/08  Transition of Care [facility]) CM/SW Contact  Carley Hammed, LCSW Phone Number: 06/16/2023, 1:50 PM  Clinical Narrative:    CSW notified by medical team that pt may be ready to DC back to [facility] tomorrow pending medical readiness. CSW advised facility and they confirmed that pt can return. TOC will continue to follow for DC needs.    Expected Discharge Plan: Skilled Nursing Facility Barriers to Discharge: Continued Medical Work up  Expected Discharge Plan and Services     Post Acute Care Choice: Skilled Nursing Facility Living arrangements for the past 2 months: Skilled Nursing Facility                                       Social Determinants of Health (SDOH) Interventions SDOH Screenings   Food Insecurity: No Food Insecurity (06/04/2023)  Housing: Low Risk  (06/04/2023)  Transportation Needs: No Transportation Needs (06/04/2023)  Utilities: Not At Risk (06/04/2023)  Financial Resource Strain: Low Risk  (08/13/2018)   Received from [facility] visits prior to 12/21/2022., [facility] visits prior to 12/21/2022.  Tobacco Use: Low Risk  (06/08/2023)    Readmission Risk Interventions    05/07/2023    4:42 PM 10/24/2022   11:21 AM 09/02/2022   10:36 AM  Readmission Risk Prevention Plan  Transportation Screening Complete Complete Complete  Medication Review Oceanographer)  Complete Complete  PCP or Specialist appointment within 3-5 days of discharge Complete Complete Complete  HRI or Home Care Consult Complete Complete Complete  SW Recovery Care/Counseling Consult Complete Complete Complete  Palliative Care Screening Not Applicable Not Applicable Not Applicable  Skilled Nursing Facility Complete Complete Complete

## 2023-06-16 NOTE — Plan of Care (Signed)

## 2023-06-16 NOTE — Progress Notes (Signed)
[facility]                                                                               Naithan Sanches, is a 67 y.o. male, DOB - 1955/12/13, ZYS:[id number] Admit date - 06/04/2023    Outpatient Primary MD for the patient is Patient, No Pcp Per  LOS - 12  days    Brief summary    67 y.o. male with a history of T2DM with polyneuropathy, DFU s/p left BKA, CAD s/p stent, HLD, PAF, HFrEF, adrenal insufficiency, CVA, BPH with chronic foley, and recent surgeries for Fournier's gangrene June 2024 having completed IV antibiotics 7/30 who presented to the ED on 06/04/2023 after falling out of bed at [facility]. He was afebrile with dehiscence of left scrotal wound with discharge. Work up showed WBC 9.3k, ESR 4, PCT <0.10, CRP 2.9 (down from 20.7 on 8/7), lactic acid 2.1 > 1.8. IV antibiotics were given and urology consulted. Urine culture grew MDR Pseudomonas, wound culture grew MDR Klebsiella and Pseudomonas for which ID was consulted, started ceftolozane-tazobactam. 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 Suprapubic catheter placed 8/19.   Discharge back to SNF when pain is well controlled.    Assessment & Plan    Assessment and Plan:  Acute on chronic combined heart failure, with bilateral pleural effusions in the setting of hypoalbuminemia and malnutrition CXR showed Interval improvement in aeration of the lungs since 06/04/2023.  He was started on IV lasix 40 mg BID, with good output.  Diuresed about 27 lit since admission.  He remains on RA. Transitioned to oral lasix, . Creatinine remains stable .    Hyponatremia:  Much improved. Sodium is 134 and stable.   Scrotal surgical wound infection S/p debridement for Fournier's gangrene Pseudomonas sensitivities returned sensitive to Zerbaxa and Avycaz. Patient transitioned to Avycaz to preserve Zerbaxa in the future  Continue with ceftazidime avibactam every 8 hours per infectious  disease, plan for 3 weeks of treatment with antibiotics. End of treatment on 06/28/2023.  Blood cultures continue to be negative. Wound care as per urology. PICC line placed  Pain control with oxycodone 5 every 6 hours prn and oxycontin 15 mg BID. Discontinued the dilaudid.  Plan for discharge to SNF, if wound care can be established at [facility] and pain controlled.     CAUTI versus asymptomatic bacteriuria  Suprapubic catheter now placed by IR 8/18.  - Urine culture with Pseudomonas. Culture with multidrug resistance but without urinary symptoms, no directed treatment for this per ID.   Hematuria: with normocytic anemia.  Hemoglobin stable around 9.   Anemia of chronic blood loss   Right foot wound: No exudate or odor at this time.  - Continue regular podiatry follow up, optimize nutritional status and glycemic control.  - Keep covered, continue daily wound care per WOC.  - Offload with prevalon boot - x rays of the foot  show Postsurgical and degenerative changes .  Recommend outpatient podiatry follow up .    Stage 2 sacral pressure injury: POA:  - Offload, foam dressing w/medihoney.    IDT2DM: Last HbA1c 7% shows adequate glycemic control  to assist wound healing.  -Increase Semglee to 24 units at bedtime and sliding scale insulin. CBG (last 3)  Recent Labs    06/15/23 2112 06/16/23 0825 06/16/23 1148  GLUCAP 101* 139* 189*  No changes in meds today.    PAF: In atrial fibrillation with CVR.   - Restarted eliquis  as  Urology recommends exchange and dilatation at 6 weeks.   not on rate-control agent at baseline.   Tinea cruris:  - Continue nystatin powder.   Chronic pain syndrome Pain control.   Hypokalemia and hypomagnesemia Replaced.      Adrenal insufficiency:  Resume cortef, midodrine .   Fall out of bed: Negative CT head, cervical spine. - Fall precautions   HLD:  - Continue statin   Depression:  - Continue escitalopram  Vitamin A deficiency:  -  Supplementing - Also empirically supplement zinc and vitamin C to aid wound healing.    Malnutrition Type:  Nutrition Problem: Moderate Malnutrition Etiology: chronic illness   Malnutrition Characteristics:  Signs/Symptoms: moderate fat depletion, moderate muscle depletion   Nutrition Interventions:  Interventions: Ensure Enlive (each supplement provides 350kcal and 20 grams of protein), MVI, Juven, Liberalize Diet  Estimated body mass index is 31.07 kg/m as calculated from the following:   Height as of this encounter: 6' 2.02" (1.88 m).   Weight as of this encounter: 109.8 kg.  Code Status: full code.  DVT Prophylaxis:  SCDs Start: 06/04/23 1157 apixaban (ELIQUIS) tablet 5 mg   Level of Care: Level of care: Telemetry Medical Family Communication: NONE AT BEDSIDE.   Procedures:  Debridement.  Consultants:   Urology Wound care  Infectious disease.  IR  Antimicrobials:   Anti-infectives (From admission, onward)    Start     Dose/Rate Route Frequency Ordered Stop   06/13/23 1000  metroNIDAZOLE (FLAGYL) tablet 500 mg        500 mg Oral Every 12 hours 06/13/23 0740     06/11/23 1400  ceftazidime-avibactam (AVYCAZ) 2.5 g in dextrose 5 % 50 mL IVPB        2.5 g 25 mL/hr over 2 Hours Intravenous Every 8 hours 06/11/23 1304     06/09/23 1600  vancomycin (VANCOREADY) IVPB 1250 mg/250 mL  Status:  Discontinued        1,250 mg 166.7 mL/hr over 90 Minutes Intravenous Every 24 hours 06/09/23 0621 06/09/23 1006   06/09/23 1400  ceftolozane-tazobactam (ZERBAXA) 3 g in sodium chloride 0.9 % 100 mL IVPB  Status:  Discontinued        3 g 122.8 mL/hr over 60 Minutes Intravenous Every 8 hours 06/09/23 0840 06/11/23 1304   06/07/23 1445  ceftolozane-tazobactam (ZERBAXA) 1.5 g in sodium chloride 0.9 % 100 mL IVPB  Status:  Discontinued        1.5 g 111.4 mL/hr over 60 Minutes Intravenous Every 8 hours 06/07/23 1352 06/09/23 0840   06/05/23 1600  vancomycin (VANCOREADY) IVPB 1250  mg/250 mL  Status:  Discontinued        1,250 mg 166.7 mL/hr over 90 Minutes Intravenous Every 12 hours 06/05/23 1442 06/09/23 0601   06/05/23 1530  ceFEPIme (MAXIPIME) 2 g in sodium chloride 0.9 % 100 mL IVPB  Status:  Discontinued        2 g 200 mL/hr over 30 Minutes Intravenous Every 8 hours 06/05/23 1442 06/07/23 1352   06/04/23 1045  vancomycin (VANCOREADY) IVPB 1500 mg/300 mL       Placed in "Followed by" Linked Group  1,500 mg 150 mL/hr over 120 Minutes Intravenous  Once 06/04/23 1032 06/05/23 0515   06/04/23 1045  vancomycin (VANCOCIN) IVPB 1000 mg/200 mL premix       Placed in "Followed by" Linked Group   1,000 mg 200 mL/hr over 60 Minutes Intravenous  Once 06/04/23 1032 06/05/23 0527   06/04/23 1030  piperacillin-tazobactam (ZOSYN) IVPB 3.375 g        3.375 g 100 mL/hr over 30 Minutes Intravenous  Once 06/04/23 1025 06/04/23 1428        Medications  Scheduled Meds:  acetaminophen  1,000 mg Oral Q8H   apixaban  5 mg Oral BID   ascorbic acid  500 mg Oral Daily   atorvastatin  40 mg Oral QPM   Chlorhexidine Gluconate Cloth  6 each Topical Daily   escitalopram  10 mg Oral Daily   feeding supplement (GLUCERNA SHAKE)  237 mL Oral TID BM   ferrous sulfate  325 mg Oral Q breakfast   finasteride  5 mg Oral Daily   furosemide  40 mg Oral Daily   hydrocortisone  5 mg Oral BID   insulin aspart  0-15 Units Subcutaneous TID WC   insulin aspart  0-5 Units Subcutaneous QHS   insulin glargine-yfgn  24 Units Subcutaneous QHS   leptospermum manuka honey  1 Application Topical Daily   metroNIDAZOLE  500 mg Oral Q12H   midodrine  10 mg Oral TID with meals   multivitamin with minerals  1 tablet Oral Daily   nutrition supplement (JUVEN)  1 packet Oral BID BM   nystatin   Topical BID   oxyCODONE  15 mg Oral Q12H   pantoprazole  40 mg Oral Daily   phenazopyridine  200 mg Oral TID WC   saccharomyces boulardii  250 mg Oral BID   senna-docusate  1 tablet Oral BID   sodium chloride  flush  5 mL Intracatheter Q8H   tamsulosin  0.4 mg Oral Daily   vitamin A  10,000 Units Oral Daily   zinc sulfate  220 mg Oral Daily   Continuous Infusions:  ceftazidime-avibactam (AVYCAZ) 2.5 g in dextrose 5 % 50 mL IVPB 2.5 g (06/16/23 0556)   PRN Meds:.acetaminophen **OR** acetaminophen, albuterol, oxyCODONE, sodium chloride flush    Subjective:   Plato Lesmeister was seen and examined today. No new complaints. Patient reports he is not very keen on going to rehab.  Pain controlled.   Objective:   Vitals:   06/15/23 0736 06/15/23 1617 06/16/23 0500 06/16/23 0618  BP: 100/63 98/60  106/72  Pulse: 76 75  82  Resp: 17 16  18   Temp: 97.7 F (36.5 C) 97.7 F (36.5 C)  97.9 F (36.6 C)  TempSrc: Oral Oral  Oral  SpO2: 99% 100%  100%  Weight:   109.8 kg   Height:        Intake/Output Summary (Last 24 hours) at 06/16/2023 1442 Last data filed at 06/16/2023 0900 Gross per 24 hour  Intake 240 ml  Output 1800 ml  Net -1560 ml   Filed Weights   06/15/23 0500 06/15/23 0556 06/16/23 0500  Weight: 109.6 kg 109.8 kg 109.8 kg     Exam General exam: Appears calm and comfortable  Respiratory system: Clear to auscultation. Respiratory effort normal. Cardiovascular system: S1 & S2 heard, RRR.  Gastrointestinal system: Abdomen is nondistended, soft and nontender.  Central nervous system: Alert and oriented. Extremities: right foot wound.  Skin: No rashes,  Data Reviewed:  I have personally reviewed following labs and imaging studies   CBC Lab Results  Component Value Date   WBC 7.0 06/15/2023   RBC 3.49 (L) 06/15/2023   HGB 9.5 (L) 06/15/2023   HCT 30.2 (L) 06/15/2023   MCV 86.5 06/15/2023   MCH 27.2 06/15/2023   PLT 138 (L) 06/15/2023   MCHC 31.5 06/15/2023   RDW 23.0 (H) 06/15/2023   LYMPHSABS 0.9 06/15/2023   MONOABS 0.4 06/15/2023   EOSABS 0.5 06/15/2023   BASOSABS 0.3 (H) 06/15/2023     Last metabolic panel Lab Results  Component Value Date    NA 134 (L) 06/15/2023   K 3.8 06/15/2023   CL 94 (L) 06/15/2023   CO2 27 06/15/2023   BUN 31 (H) 06/15/2023   CREATININE 0.82 06/15/2023   GLUCOSE 142 (H) 06/15/2023   GFRNONAA >60 06/15/2023   CALCIUM 8.6 (L) 06/15/2023   PHOS 3.2 05/07/2023   PROT 5.0 (L) 06/05/2023   ALBUMIN 2.5 (L) 06/05/2023   BILITOT 1.0 06/05/2023   ALKPHOS 68 06/05/2023   AST 16 06/05/2023   ALT 11 06/05/2023   ANIONGAP 13 06/15/2023    CBG (last 3)  Recent Labs    06/15/23 2112 06/16/23 0825 06/16/23 1148  GLUCAP 101* 139* 189*      Coagulation Profile: No results for input(s): "INR", "PROTIME" in the last 168 hours.   Radiology Studies: No results found.     Kathlen Mody M.D. [facility] 06/16/2023, 2:42 PM  Available via Epic secure chat 7am-7pm After 7 pm, please refer to night coverage provider listed on amion.

## 2023-06-16 NOTE — Progress Notes (Signed)
   8 Days Post-Op Subjective: NAEON. Sleeping, as he usually is on rounds.  Consider reducing his narcotic consumption Objective: Vital signs in last 24 hours: Temp:  [97.7 F (36.5 C)-97.9 F (36.6 C)] 97.9 F (36.6 C) (08/26 0618) Pulse Rate:  [75-82] 82 (08/26 0618) Resp:  [16-18] 18 (08/26 0618) BP: (98-106)/(60-72) 106/72 (08/26 0618) SpO2:  [100 %] 100 % (08/26 0618) Weight:  [109.8 kg] 109.8 kg (08/26 0500)  Assessment/Plan: # Purulent drainage from incisions #urethral fistula Sutures removed in ED. There was initially a pinhole sinus draining at the bottom of the right side suture.  Would culture (+) Kleb,Pseud. Broadly covered. UC (+) Pseudomonas Taken back to OR by Dr. Annabell Howells for debridement 8/18 Signs of urethral fistula. SP Tube placed in IR 8/19. Exchange and dilate at 6 weeks with IR.  Foley catheter removed late last week. If reliable wound care can be established at one of his facilities, he can discharge whenever medically stable. Patient has been seen in hospital multiple times now.  From what I can see follow-up has never been established.  I continue to request that once he is placed, a point of contact be provided so that I can have my office schedule him an appointment. Concern expressed for clogging of SPT and fistula leakage into the wound bed.  I examined the wounds myself today and they do not look different from the several days patient had an SPT and a Foley.  SP tube was hand irrigated myself as I have done on each of his visits with no sign of restriction.  We will provide patient with 200 mg dose of Pyridium today and reassess wound bed for signs of orange staining over the coming days.   #hydronephrosis Chronic in nature.  This has been noted on multiple CT images after adequate time provided for urinary decompression. Preserved renal function    Intake/Output from previous day: 08/25 0701 - 08/26 0700 In: -  Out: 1800 [Urine:1800]  Intake/Output  this shift: Total I/O In: 240 [P.O.:240] Out: -   Physical Exam:  General: Alert and oriented CV: No cyanosis Lungs: equal chest rise Abdomen: Soft, NTND, no rebound or guarding Skin: bilateral scrotal incisions. Red beefy pink wound beds. No purulent drainage. Gu: Foley removed, SPT with hematuria, catheter not secured.   Lab Results: Recent Labs    06/14/23 0220 06/15/23 0311  HGB 9.4* 9.5*  HCT 30.0* 30.2*   BMET Recent Labs    06/14/23 0220 06/15/23 0311  NA 128* 134*  K 3.5 3.8  CL 92* 94*  CO2 28 27  GLUCOSE 140* 142*  BUN 34* 31*  CREATININE 0.95 0.82  CALCIUM 8.1* 8.6*     Studies/Results: No results found.    LOS: 12 days   Elmon Kirschner, NP [facility] Pager: [phone]  06/16/2023, 1:19 PM

## 2023-06-17 DIAGNOSIS — I480 Paroxysmal atrial fibrillation: Secondary | ICD-10-CM | POA: Diagnosis not present

## 2023-06-17 DIAGNOSIS — J90 Pleural effusion, not elsewhere classified: Secondary | ICD-10-CM | POA: Diagnosis not present

## 2023-06-17 DIAGNOSIS — R310 Gross hematuria: Secondary | ICD-10-CM | POA: Diagnosis not present

## 2023-06-17 DIAGNOSIS — E1151 Type 2 diabetes mellitus with diabetic peripheral angiopathy without gangrene: Secondary | ICD-10-CM | POA: Diagnosis not present

## 2023-06-17 LAB — BASIC METABOLIC PANEL
Anion gap: 8 (ref 5–15)
BUN: 30 mg/dL — ABNORMAL HIGH (ref 8–23)
CO2: 28 mmol/L (ref 22–32)
Calcium: 8.4 mg/dL — ABNORMAL LOW (ref 8.9–10.3)
Chloride: 95 mmol/L — ABNORMAL LOW (ref 98–111)
Creatinine, Ser: 0.79 mg/dL (ref 0.61–1.24)
GFR, Estimated: >60 mL/min (ref >60)
Glucose, Bld: 162 mg/dL — ABNORMAL HIGH (ref 70–99)
Potassium: 3.8 mmol/L (ref 3.5–5.1)
Sodium: 131 mmol/L — ABNORMAL LOW (ref 135–145)

## 2023-06-17 LAB — GLUCOSE, CAPILLARY
Glucose-Capillary: 184 mg/dL — ABNORMAL HIGH (ref 70–99)
Glucose-Capillary: 170 mg/dL — ABNORMAL HIGH (ref 70–99)
Glucose-Capillary: 160 mg/dL — ABNORMAL HIGH (ref 70–99)
Glucose-Capillary: 163 mg/dL — ABNORMAL HIGH (ref 70–99)

## 2023-06-17 LAB — CBC
HCT: 29.7 % — ABNORMAL LOW (ref 39.0–52.0)
Hemoglobin: 9 g/dL — ABNORMAL LOW (ref 13.0–17.0)
MCH: 26.2 pg (ref 26.0–34.0)
MCHC: 30.3 g/dL (ref 30.0–36.0)
MCV: 86.6 fL (ref 80.0–100.0)
Platelets: 132 10*3/uL — ABNORMAL LOW (ref 150–400)
RBC: 3.43 MIL/uL — ABNORMAL LOW (ref 4.22–5.81)
RDW: 22.5 % — ABNORMAL HIGH (ref 11.5–15.5)
WBC: 6.3 10*3/uL (ref 4.0–10.5)
nRBC: 0 % (ref 0.0–0.2)

## 2023-06-17 MED ORDER — ORAL CARE MOUTH RINSE: 15.0000 mL | OROMUCOSAL | Status: DC | PRN

## 2023-06-17 MED ORDER — FUROSEMIDE 40 MG PO TABS
40.0000 mg | ORAL_TABLET | Freq: Two times a day (BID) | ORAL | Status: DC
  Administered 2023-06-17 – 2023-06-19 (×4): 40 mg via ORAL
  Filled 2023-06-17 (×4): qty 1

## 2023-06-17 NOTE — Progress Notes (Signed)
Heart Failure Navigator Progress Note  Assessed for Heart & Vascular TOC clinic readiness.  Patient does not meet criteria due to having cardiologist at [facility] and wishes to stay there.   Navigator  will sign off at this time.    Achol Azpeitia,RN, BSN,MSN Heart Failure Nurse Navigator. Contact by secure chat only.

## 2023-06-17 NOTE — Progress Notes (Signed)
Pharmacy Antibiotic Note  Peter Yang is a 67 y.o. male admitted on 06/04/2023 with scrotal wound infection/fournier's gangrene with tissue/wound cultures growing MDR Psuedomonas - added sensitivities show susceptibility to Avycaz which will cover both the PsA and Kleb PNA isolated in cultures. Pharmacy has been consulted for Avycaz dosing.  Patient continues on Avycaz 2.5 g q8h + Metronidazole 500 mg q12h. Dose remains appropriate for indication and renal function (CrCl ~120, SCr 0.79). Current planned EOT: 9/7 per infectious disease. PICC line placed 8/23 for outpatient antimicrobials.  Plan: - Continue Ceftazidime/avibactam (Avycaz) 2.5g IV every 8 hours - Will continue to follow renal function and clinical status  Height: 6' 2.02" (188 cm) Weight: 111.7 kg (246 lb 4.1 oz) IBW/kg (Calculated) : 82.24  Temp (24hrs), Avg:98.1 F (36.7 C), Min:97.7 F (36.5 C), Max:98.4 F (36.9 C)  Recent Labs  Lab 06/11/23 0054 06/12/23 1159 06/13/23 0327 06/14/23 0220 06/15/23 0311 06/17/23 0338  WBC 9.3  --   --  9.3 7.0 6.3  CREATININE 0.84 0.71 0.69 0.95 0.82 0.79    Estimated Creatinine Clearance: 120.8 mL/min (by C-G formula based on SCr of 0.79 mg/dL).    Allergies  Allergen Reactions   Sglt2 Inhibitors Other (See Comments)    Fournier's gangrene    Antimicrobials this admission: Zosyn 8/14 x 1 Vancomycin 8/15 >> 8/18 Cefepime 8/15 >> 8/17 Ceftolozane/tazobactam (Zerbaxa) 8/17 >> 8/21 Ceftazidime/avibactam (Avycaz) 8/21 >> (9/7) Metronidazole 8/23 >> (9/7)  Microbiology results: 8/15 MRSA PCR >> positive 8/14 UCx >> 100k PsA (I-Ceftaz/Cipro, R Imi, S Gent) 8/14 BCx >> NGf 8/14 WCx (scrotum) >> MDR PsA (R-imipenem) + ESBL Kleb PNA + Bacteroides fragilis 8/15 MRSA Swab +  Thank you for allowing pharmacy to be a part of this patient's care.  Wilburn Cornelia, PharmD, BCPS Clinical Pharmacist 06/17/2023 9:11 AM   Please refer to AMION for pharmacy phone number

## 2023-06-17 NOTE — Progress Notes (Signed)
   9 Days Post-Op Subjective: NAEON.  Sleeping on arrival.  Long conversation about participating in care, at least trying to get up into the chair, reducing narcotics consumption, and physical recovery.  Objective: Vital signs in last 24 hours: Temp:  [97.7 F (36.5 C)-98.4 F (36.9 C)] 97.9 F (36.6 C) (08/27 0840) Pulse Rate:  [76-86] 78 (08/27 0840) Resp:  [17-18] 17 (08/27 0840) BP: (101-115)/(71-81) 113/73 (08/27 0840) SpO2:  [91 %-99 %] 99 % (08/27 0605) Weight:  [111.7 kg] 111.7 kg (08/27 0500)  Assessment/Plan: # Purulent drainage from incisions #urethral fistula Sutures removed in ED. There was initially a pinhole sinus draining at the bottom of the right side suture.  Would culture (+) Kleb,Pseud. Broadly covered. UC (+) Pseudomonas Taken back to OR by Dr. Annabell Howells for debridement 8/18 Signs of urethral fistula. SP Tube placed in IR 8/19. Exchange and dilate at 6 weeks with IR.  Foley catheter removed late last week. If reliable wound care can be established at one of his facilities, he can discharge whenever medically stable. Patient has been seen in hospital multiple times now.  From what I can see follow-up has never been established.  I continue to request that once he is placed, a point of contact be provided so that I can have my office schedule him an appointment. Patient has been given Pyridium for over 24 hours to help assess for urethral fistula/leakage into wound bed.  I saw no evidence of this when performing wound care today.  I copiously irrigated his suprapubic tube and retrieved several small older blood clots which may have been responsible for plugging off his catheter.  It would appear that leakage observed by the week and urologist was from urine being diverted into the urethra.  He already has quite a small bladder capacity and will spasm with volumes of above 10-15 cc He continues to have hematuria that developed a few days ago.  Is presumably occurred during  patient care.  Reviewed with his primary team.  Eliquis to be held for the next couple of days.   #hydronephrosis Chronic in nature.  This has been noted on multiple CT images after adequate time provided for urinary decompression. Preserved renal function    Intake/Output from previous day: 08/26 0701 - 08/27 0700 In: 730 [P.O.:480; IV Piggyback:250] Out: 1450 [Urine:1450]  Intake/Output this shift: Total I/O In: 240 [P.O.:240] Out: -   Physical Exam:  General: Alert and oriented CV: No cyanosis Lungs: equal chest rise Abdomen: Soft, NTND, no rebound or guarding Skin: bilateral scrotal incisions. Red beefy pink wound beds. No purulent drainage. Gu: Foley removed, SPT with hematuria  Lab Results: Recent Labs    06/15/23 0311 06/17/23 0338  HGB 9.5* 9.0*  HCT 30.2* 29.7*   BMET Recent Labs    06/15/23 0311 06/17/23 0338  NA 134* 131*  K 3.8 3.8  CL 94* 95*  CO2 27 28  GLUCOSE 142* 162*  BUN 31* 30*  CREATININE 0.82 0.79  CALCIUM 8.6* 8.4*     Studies/Results: No results found.    LOS: 13 days   Peter Kirschner, NP [facility] Pager: [phone]  06/17/2023, 10:44 AM

## 2023-06-17 NOTE — Progress Notes (Signed)
[facility]                                                                               Peter Yang, is a 67 y.o. male, DOB - May 21, 1956, QIH:[id number] Admit date - 06/04/2023    Outpatient Primary MD for the patient is Patient, No Pcp Per  LOS - 13  days    Brief summary    67 y.o. male with a history of T2DM with polyneuropathy, DFU s/p left BKA, CAD s/p stent, HLD, PAF, HFrEF, adrenal insufficiency, CVA, BPH with chronic foley, and recent surgeries for Fournier's gangrene June 2024 having completed IV antibiotics 7/30 who presented to the ED on 06/04/2023 after falling out of bed at [facility]. He was afebrile with dehiscence of left scrotal wound with discharge. Work up showed WBC 9.3k, ESR 4, PCT <0.10, CRP 2.9 (down from 20.7 on 8/7), lactic acid 2.1 > 1.8. IV antibiotics were given and urology consulted. Urine culture grew MDR Pseudomonas, wound culture grew MDR Klebsiella and Pseudomonas for which ID was consulted, started ceftolozane-tazobactam. 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 Suprapubic catheter placed 8/19.   Discharge back to SNF when pain is well controlled and hematuria resolves. .    Assessment & Plan    Assessment and Plan:  Acute on chronic combined heart failure, with bilateral pleural effusions in the setting of hypoalbuminemia and malnutrition CXR showed Interval improvement in aeration of the lungs since 06/04/2023.  He was started on IV lasix 40 mg BID, with good output.  Diuresed about 30 lit since admission.  He remains on RA. Transitioned to oral lasix 40 mg BID .  Creatinine remains stable .    Hyponatremia:  Much improved. Sodium is 134 and stable.   Scrotal surgical wound infection S/p debridement for Fournier's gangrene Pseudomonas sensitive to Zerbaxa and Avycaz. Patient transitioned to Avycaz to preserve Zerbaxa in the future  Continue with ceftazidime avibactam every 8 hours per  infectious disease, plan for 3 weeks of treatment with antibiotics. End of treatment on 06/28/2023.  Blood cultures continue to be negative. Wound care as per urology. PICC line placed  Pain control with oxycodone 5 every 6 hours prn and oxycontin 15 mg BID. Patient will need follow up with Urology on discharge as they recommend exchange and dilatation at 6 weeks.  Over the weekend patient had some urine leakage around the foley and hematuria,. Urology irrigated his suprapubic tube today and retrieved small  blood clots which might have plugged his catheter and was responsible for the leakage.  Eliquis is also being for the hematuria.  Will hold the Eiiquis until hematuria resolves and will plan for discharge to SNF once his pain is controlled and hematuria improves.     CAUTI versus asymptomatic bacteriuria  Suprapubic catheter now placed by IR 8/18.  - Urine culture with Pseudomonas. Culture with multidrug resistance but without urinary symptoms, no directed treatment for this per ID.   Hematuria: with normocytic anemia.  Hemoglobin stable around 9.   Anemia of chronic blood loss   Right foot wound: No exudate or odor at this time.  -  Continue regular podiatry follow up, optimize nutritional status and glycemic control.  - Keep covered, continue daily wound care per WOC.  - Offload with prevalon boot - x rays of the foot  show Postsurgical and degenerative changes .  Recommend outpatient podiatry follow up .    Stage 2 sacral pressure injury: POA:  - Offload, foam dressing w/medihoney.    IDT2DM: Last HbA1c 7% shows adequate glycemic control to assist wound healing.  -Increase Semglee to 24 units at bedtime and sliding scale insulin. CBG (last 3)  Recent Labs    06/16/23 2107 06/17/23 0835 06/17/23 1227  GLUCAP 179* 184* 170*  No changes in meds today.    PAF: In atrial fibrillation with CVR.   Eliquis is being held for hematuria.    not on rate-control agent at  baseline.   Tinea cruris:  - Continue nystatin powder.   Chronic pain syndrome Pain control.   Hypokalemia and hypomagnesemia Replaced.      Adrenal insufficiency:  Resume cortef, midodrine .   Fall out of bed: Negative CT head, cervical spine. - Fall precautions   HLD:  - Continue statin   Depression:  - Continue escitalopram  Vitamin A deficiency:  - Supplementing - Also empirically supplement zinc and vitamin C to aid wound healing.    Malnutrition Type:  Nutrition Problem: Moderate Malnutrition Etiology: chronic illness   Malnutrition Characteristics:  Signs/Symptoms: moderate fat depletion, moderate muscle depletion   Nutrition Interventions:  Interventions: Ensure Enlive (each supplement provides 350kcal and 20 grams of protein), MVI, Juven, Liberalize Diet  Estimated body mass index is 31.6 kg/m as calculated from the following:   Height as of this encounter: 6' 2.02" (1.88 m).   Weight as of this encounter: 111.7 kg.  Code Status: full code.  DVT Prophylaxis:  SCDs Start: 06/04/23 1157   Level of Care: Level of care: Telemetry Medical Family Communication: NONE AT BEDSIDE.   Procedures:  Debridement.  Consultants:   Urology Wound care  Infectious disease.  IR  Antimicrobials:   Anti-infectives (From admission, onward)    Start     Dose/Rate Route Frequency Ordered Stop   06/13/23 1000  metroNIDAZOLE (FLAGYL) tablet 500 mg        500 mg Oral Every 12 hours 06/13/23 0740     06/11/23 1400  ceftazidime-avibactam (AVYCAZ) 2.5 g in dextrose 5 % 50 mL IVPB        2.5 g 25 mL/hr over 2 Hours Intravenous Every 8 hours 06/11/23 1304     06/09/23 1600  vancomycin (VANCOREADY) IVPB 1250 mg/250 mL  Status:  Discontinued        1,250 mg 166.7 mL/hr over 90 Minutes Intravenous Every 24 hours 06/09/23 0621 06/09/23 1006   06/09/23 1400  ceftolozane-tazobactam (ZERBAXA) 3 g in sodium chloride 0.9 % 100 mL IVPB  Status:  Discontinued        3  g 122.8 mL/hr over 60 Minutes Intravenous Every 8 hours 06/09/23 0840 06/11/23 1304   06/07/23 1445  ceftolozane-tazobactam (ZERBAXA) 1.5 g in sodium chloride 0.9 % 100 mL IVPB  Status:  Discontinued        1.5 g 111.4 mL/hr over 60 Minutes Intravenous Every 8 hours 06/07/23 1352 06/09/23 0840   06/05/23 1600  vancomycin (VANCOREADY) IVPB 1250 mg/250 mL  Status:  Discontinued        1,250 mg 166.7 mL/hr over 90 Minutes Intravenous Every 12 hours 06/05/23 1442 06/09/23 0601   06/05/23 1530  ceFEPIme (  MAXIPIME) 2 g in sodium chloride 0.9 % 100 mL IVPB  Status:  Discontinued        2 g 200 mL/hr over 30 Minutes Intravenous Every 8 hours 06/05/23 1442 06/07/23 1352   06/04/23 1045  vancomycin (VANCOREADY) IVPB 1500 mg/300 mL       Placed in "Followed by" Linked Group   1,500 mg 150 mL/hr over 120 Minutes Intravenous  Once 06/04/23 1032 06/05/23 0515   06/04/23 1045  vancomycin (VANCOCIN) IVPB 1000 mg/200 mL premix       Placed in "Followed by" Linked Group   1,000 mg 200 mL/hr over 60 Minutes Intravenous  Once 06/04/23 1032 06/05/23 0527   06/04/23 1030  piperacillin-tazobactam (ZOSYN) IVPB 3.375 g        3.375 g 100 mL/hr over 30 Minutes Intravenous  Once 06/04/23 1025 06/04/23 1428        Medications  Scheduled Meds:  acetaminophen  1,000 mg Oral Q8H   ascorbic acid  500 mg Oral Daily   atorvastatin  40 mg Oral QPM   Chlorhexidine Gluconate Cloth  6 each Topical Daily   escitalopram  10 mg Oral Daily   feeding supplement (GLUCERNA SHAKE)  237 mL Oral TID BM   ferrous sulfate  325 mg Oral Q breakfast   finasteride  5 mg Oral Daily   furosemide  40 mg Oral BID   hydrocortisone  5 mg Oral BID   insulin aspart  0-15 Units Subcutaneous TID WC   insulin aspart  0-5 Units Subcutaneous QHS   insulin glargine-yfgn  24 Units Subcutaneous QHS   leptospermum manuka honey  1 Application Topical Daily   metroNIDAZOLE  500 mg Oral Q12H   midodrine  10 mg Oral TID with meals    multivitamin with minerals  1 tablet Oral Daily   nutrition supplement (JUVEN)  1 packet Oral BID BM   nystatin   Topical BID   oxyCODONE  15 mg Oral Q12H   pantoprazole  40 mg Oral Daily   phenazopyridine  200 mg Oral TID WC   saccharomyces boulardii  250 mg Oral BID   senna-docusate  1 tablet Oral BID   sodium chloride flush  5 mL Intracatheter Q8H   tamsulosin  0.4 mg Oral Daily   vitamin A  10,000 Units Oral Daily   zinc sulfate  220 mg Oral Daily   Continuous Infusions:  ceftazidime-avibactam (AVYCAZ) 2.5 g in dextrose 5 % 50 mL IVPB 2.5 g (06/17/23 1315)   PRN Meds:.acetaminophen **OR** acetaminophen, albuterol, mouth rinse, oxyCODONE, sodium chloride flush    Subjective:   Peter Yang was seen and examined today. Pain in the groin hasn't resolved.  spasms in the bladder.   Objective:   Vitals:   06/16/23 2108 06/17/23 0500 06/17/23 0605 06/17/23 0840  BP: 115/74  108/81 113/73  Pulse: 86  76 78  Resp: 17   17  Temp: 98.4 F (36.9 C)  97.7 F (36.5 C) 97.9 F (36.6 C)  TempSrc: Oral  Oral Oral  SpO2: 95%  99% 98%  Weight:  111.7 kg    Height:        Intake/Output Summary (Last 24 hours) at 06/17/2023 1340 Last data filed at 06/17/2023 1230 Gross per 24 hour  Intake 1120 ml  Output 2225 ml  Net -1105 ml   Filed Weights   06/15/23 0556 06/16/23 0500 06/17/23 0500  Weight: 109.8 kg 109.8 kg 111.7 kg     Exam General exam:  ill appearing gentleman, not in distress Respiratory system: Clear to auscultation. Respiratory effort normal. Cardiovascular system: S1 & S2 heard, RRR. No JVD,  Gastrointestinal system: Abdomen is nondistended, soft and nontender.  Central nervous system: Alert and oriented. No focal neurological deficits. Extremities: right foot wound  Skin: No rashes, lesions or ulcers Psychiatry: Mood & affect appropriate.          Data Reviewed:  I have personally reviewed following labs and imaging studies   CBC Lab Results   Component Value Date   WBC 6.3 06/17/2023   RBC 3.43 (L) 06/17/2023   HGB 9.0 (L) 06/17/2023   HCT 29.7 (L) 06/17/2023   MCV 86.6 06/17/2023   MCH 26.2 06/17/2023   PLT 132 (L) 06/17/2023   MCHC 30.3 06/17/2023   RDW 22.5 (H) 06/17/2023   LYMPHSABS 0.9 06/15/2023   MONOABS 0.4 06/15/2023   EOSABS 0.5 06/15/2023   BASOSABS 0.3 (H) 06/15/2023     Last metabolic panel Lab Results  Component Value Date   NA 131 (L) 06/17/2023   K 3.8 06/17/2023   CL 95 (L) 06/17/2023   CO2 28 06/17/2023   BUN 30 (H) 06/17/2023   CREATININE 0.79 06/17/2023   GLUCOSE 162 (H) 06/17/2023   GFRNONAA >60 06/17/2023   CALCIUM 8.4 (L) 06/17/2023   PHOS 3.2 05/07/2023   PROT 5.0 (L) 06/05/2023   ALBUMIN 2.5 (L) 06/05/2023   BILITOT 1.0 06/05/2023   ALKPHOS 68 06/05/2023   AST 16 06/05/2023   ALT 11 06/05/2023   ANIONGAP 8 06/17/2023    CBG (last 3)  Recent Labs    06/16/23 2107 06/17/23 0835 06/17/23 1227  GLUCAP 179* 184* 170*      Coagulation Profile: No results for input(s): "INR", "PROTIME" in the last 168 hours.   Radiology Studies: No results found.     Kathlen Mody M.D. [facility] 06/17/2023, 1:40 PM  Available via Epic secure chat 7am-7pm After 7 pm, please refer to night coverage provider listed on amion.

## 2023-06-17 NOTE — TOC Progression Note (Signed)
Transition of Care [facility]) - Progression Note    Patient Details  Name: Peter Yang MRN: [id number] Date of Birth: 10-May-1956  Transition of Care [facility]) CM/SW Contact  Carley Hammed, LCSW Phone Number: 06/17/2023, 2:23 PM  Clinical Narrative:    CSW was advised that pt is not medically stable to DC today due to ongoing medical needs. CSW updated facility who will continue to follow. TOC will continue to follow for discharge needs.    Expected Discharge Plan: Skilled Nursing Facility Barriers to Discharge: Continued Medical Work up  Expected Discharge Plan and Services     Post Acute Care Choice: Skilled Nursing Facility Living arrangements for the past 2 months: Skilled Nursing Facility                                       Social Determinants of Health (SDOH) Interventions SDOH Screenings   Food Insecurity: No Food Insecurity (06/04/2023)  Housing: Low Risk  (06/04/2023)  Transportation Needs: No Transportation Needs (06/04/2023)  Utilities: Not At Risk (06/04/2023)  Financial Resource Strain: Low Risk  (08/13/2018)   Received from [facility] visits prior to 12/21/2022., [facility] visits prior to 12/21/2022.  Tobacco Use: Low Risk  (06/08/2023)    Readmission Risk Interventions    05/07/2023    4:42 PM 10/24/2022   11:21 AM 09/02/2022   10:36 AM  Readmission Risk Prevention Plan  Transportation Screening Complete Complete Complete  Medication Review [employer])  Complete Complete  PCP or Specialist appointment within 3-5 days of discharge Complete Complete Complete  HRI or Home Care Consult Complete Complete Complete  SW Recovery Care/Counseling Consult Complete Complete Complete  Palliative Care Screening Not Applicable Not Applicable Not Applicable  Skilled Nursing Facility Complete Complete Complete

## 2023-06-18 DIAGNOSIS — E876 Hypokalemia: Secondary | ICD-10-CM | POA: Diagnosis not present

## 2023-06-18 DIAGNOSIS — R569 Unspecified convulsions: Secondary | ICD-10-CM | POA: Diagnosis not present

## 2023-06-18 LAB — CBC WITH DIFFERENTIAL/PLATELET
Abs Immature Granulocytes: 0.03 10*3/uL (ref 0.00–0.07)
Basophils Absolute: 0.1 10*3/uL (ref 0.0–0.1)
Basophils Relative: 1 %
Eosinophils Absolute: 0.5 10*3/uL (ref 0.0–0.5)
Eosinophils Relative: 9 %
HCT: 28.3 % — ABNORMAL LOW (ref 39.0–52.0)
Hemoglobin: 8.7 g/dL — ABNORMAL LOW (ref 13.0–17.0)
Immature Granulocytes: 1 %
Lymphocytes Relative: 18 %
Lymphs Abs: 1.2 10*3/uL (ref 0.7–4.0)
MCH: 27.1 pg (ref 26.0–34.0)
MCHC: 30.7 g/dL (ref 30.0–36.0)
MCV: 88.2 fL (ref 80.0–100.0)
Monocytes Absolute: 0.7 10*3/uL (ref 0.1–1.0)
Monocytes Relative: 12 %
Neutro Abs: 3.9 10*3/uL (ref 1.7–7.7)
Neutrophils Relative %: 59 %
Platelets: 128 10*3/uL — ABNORMAL LOW (ref 150–400)
RBC: 3.21 MIL/uL — ABNORMAL LOW (ref 4.22–5.81)
RDW: 22.3 % — ABNORMAL HIGH (ref 11.5–15.5)
WBC: 6.4 10*3/uL (ref 4.0–10.5)
nRBC: 0 % (ref 0.0–0.2)

## 2023-06-18 LAB — BASIC METABOLIC PANEL
Anion gap: 11 (ref 5–15)
BUN: 30 mg/dL — ABNORMAL HIGH (ref 8–23)
CO2: 27 mmol/L (ref 22–32)
Calcium: 8.4 mg/dL — ABNORMAL LOW (ref 8.9–10.3)
Chloride: 95 mmol/L — ABNORMAL LOW (ref 98–111)
Creatinine, Ser: 0.93 mg/dL (ref 0.61–1.24)
GFR, Estimated: >60 mL/min (ref >60)
Glucose, Bld: 150 mg/dL — ABNORMAL HIGH (ref 70–99)
Potassium: 3.7 mmol/L (ref 3.5–5.1)
Sodium: 133 mmol/L — ABNORMAL LOW (ref 135–145)

## 2023-06-18 LAB — GLUCOSE, CAPILLARY
Glucose-Capillary: 155 mg/dL — ABNORMAL HIGH (ref 70–99)
Glucose-Capillary: 171 mg/dL — ABNORMAL HIGH (ref 70–99)
Glucose-Capillary: 166 mg/dL — ABNORMAL HIGH (ref 70–99)
Glucose-Capillary: 211 mg/dL — ABNORMAL HIGH (ref 70–99)

## 2023-06-18 LAB — SUSCEPTIBILITY, AER + ANAEROB: Source of Sample: 88013

## 2023-06-18 MED ORDER — ALBUTEROL SULFATE (2.5 MG/3ML) 0.083% IN NEBU
2.5000 mg | INHALATION_SOLUTION | Freq: Four times a day (QID) | RESPIRATORY_TRACT | Status: DC | PRN
  Administered 2023-06-18 – 2023-06-19 (×2): 2.5 mg via RESPIRATORY_TRACT
  Filled 2023-06-18 (×3): qty 3

## 2023-06-18 NOTE — Progress Notes (Signed)
   10 Days Post-Op Subjective: NAEON.  Sleeping on arrival.  Long conversation about participating in care, at least trying to get up into the chair, reducing narcotics consumption, and physical recovery.  Objective: Vital signs in last 24 hours: Temp:  [97.6 F (36.4 C)-98.3 F (36.8 C)] 98.3 F (36.8 C) (08/28 0836) Pulse Rate:  [77-86] 86 (08/28 0836) Resp:  [17-20] 17 (08/28 0836) BP: (103-146)/(68-130) 110/75 (08/28 0836) SpO2:  [97 %-100 %] 97 % (08/28 0836) Weight:  [108.6 kg] 108.6 kg (08/28 0500)  Assessment/Plan: # Purulent drainage from incisions #urethral fistula Sutures removed in ED. There was initially a pinhole sinus draining at the bottom of the right side suture.  Would culture (+) Kleb,Pseud. Broadly covered. UC (+) Pseudomonas Taken back to OR by Dr. Annabell Howells for debridement 8/18 Signs of urethral fistula. SP Tube placed in IR 8/19. Exchange and dilate at 6 weeks with IR.  Foley catheter removed late last week. If reliable wound care can be established at one of his facilities, he can discharge whenever medically stable. Patient has been seen in hospital multiple times now.  From what I can see follow-up has never been established.  I continue to request that once he is placed, a point of contact be provided so that I can have my office schedule him an appointment. Patient has been given Pyridium for 2 days to help assess for urethral fistula/leakage into wound bed.  I saw no evidence of this when performing wound care.  Stopping pyridium today. Small bladder capacity. Attributable to spasm. He continues to have hematuria that developed a few days ago.  Is presumably occurred during patient care. Eliquis on hold. Improved today.    #hydronephrosis Chronic in nature.  This has been noted on multiple CT images after adequate time provided for urinary decompression. Preserved renal function    Intake/Output from previous day: 08/27 0701 - 08/28 0700 In: 2414.1  [P.O.:2160; IV Piggyback:254.1] Out: 4075 [Urine:4075]  Intake/Output this shift: Total I/O In: 240 [P.O.:240] Out: -   Physical Exam:  General: Alert and oriented CV: No cyanosis Lungs: equal chest rise Abdomen: Soft, NTND, no rebound or guarding Skin: bilateral scrotal incisions. Red beefy pink wound beds. No purulent drainage. Gu: Foley removed, SPT with hematuria  Lab Results: Recent Labs    06/17/23 0338 06/18/23 0220  HGB 9.0* 8.7*  HCT 29.7* 28.3*   BMET Recent Labs    06/17/23 0338 06/18/23 0220  NA 131* 133*  K 3.8 3.7  CL 95* 95*  CO2 28 27  GLUCOSE 162* 150*  BUN 30* 30*  CREATININE 0.79 0.93  CALCIUM 8.4* 8.4*     Studies/Results: No results found.    LOS: 14 days   Elmon Kirschner, NP [facility] Pager: [phone]  06/18/2023, 9:29 AM

## 2023-06-18 NOTE — Progress Notes (Signed)
PROGRESS NOTE  Peter Yang ZOX:[id number] DOB: 10-Mar-1956 DOA: 06/04/2023 PCP: Patient, No Pcp Per   LOS: 14 days   Brief Narrative / Interim history: 67 y.o. male with a history of T2DM with polyneuropathy, DFU s/p left BKA, CAD s/p stent, HLD, PAF, HFrEF, adrenal insufficiency, CVA, BPH with chronic foley, and recent surgeries for Fournier's gangrene June 2024 having completed IV antibiotics 7/30 who presented to the ED on 06/04/2023 after falling out of bed at [facility]. He was afebrile with dehiscence of left scrotal wound with discharge. Work up showed WBC 9.3k, ESR 4, PCT <0.10, CRP 2.9 (down from 20.7 on 8/7), lactic acid 2.1 > 1.8. IV antibiotics were given and urology consulted. Urine culture grew MDR Pseudomonas, wound culture grew MDR Klebsiella and Pseudomonas for which ID was consulted, started ceftolozane-tazobactam. 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 Suprapubic catheter placed 8/19.   Subjective / 24h Interval events: Reports some pain, but overall controlled.  Assesement and Plan: Principal Problem:   Wound dehiscence in puerperium, perineal Active Problems:   UTI (urinary tract infection) due to urinary indwelling catheter (HCC)   PAF (paroxysmal atrial fibrillation) (HCC)   Type 2 diabetes mellitus with diabetic peripheral angiopathy without gangrene, without long-term current use of insulin (HCC)   Hematuria   Pleural effusion, bilateral   Soft tissue infection   Malnutrition of moderate degree   History of MDR Pseudomonas aeruginosa infection  Principal problem History of Fournier's gangrene, persistent scrotal surgical wound infection -urology consulted and following, he was taken to the OR on 8/18.  He grew drug-resistant Pseudomonas, currently on Avycaz per ID -Plan for total of 3 weeks, end of treatment 06/28/2023.  PICC line has been placed -Pain seems to be controlled with OxyContin as well as  oxycodone  Active problems CAUTI versus asymptomatic bacteriuria - Suprapubic catheter now placed by IR 8/18.  - Urine culture with Pseudomonas. Culture with multidrug resistance but without urinary symptoms, no directed treatment for this per ID.   Acute on chronic combined heart failure, with bilateral pleural effusions in the setting of hypoalbuminemia and malnutrition - CXR showed Interval improvement in aeration of the lungs since 06/04/2023. He was started on IV lasix 40 mg BID, with good output.  Diuresed about 32 lit since admission.  He remains on RA. Transitioned to oral lasix 40 mg BID. Creatinine remains stable .    Hyponatremia- Much improved.  Sodium remains stable, 133 this morning  Hematuria - with normocytic anemia.  Slightly better, slightly dropped this morning.  Continue to monitor   Anemia of chronic blood loss -monitor   Right foot wound - No exudate or odor at this time.  Continue regular podiatry follow up, optimize nutritional status and glycemic control. Keep covered, continue daily wound care per WOC. Offload with prevalon boot. X rays of the foot  show Postsurgical and degenerative changes. Recommend outpatient podiatry follow up .    Stage 2 sacral pressure injury - POA, offload, foam dressing w/medihoney.    IDT2DM - Last HbA1c 7% shows adequate glycemic control to assist wound healing.  Continue long-acting as well as sliding scale  Lab Results  Component Value Date   HGBA1C 7.0 (H) 04/02/2023   CBG (last 3)  Recent Labs    06/17/23 1601 06/17/23 2044 06/18/23 0832  GLUCAP 160* 163* 155*   PAF -Eliquis is being held for hematuria. Not on rate-control agent at baseline.   Tinea cruris - Continue  nystatin powder.    Chronic pain syndrome - Pain control.    Hypokalemia and hypomagnesemia - Replaced.  Continue to monitor   Adrenal insufficiency - Resume cortef, midodrine .    HLD - Continue statin   Depression - Continue escitalopram   Vitamin  A deficiency - Supplementing. Also empirically supplement zinc and vitamin C to aid wound healing.  Scheduled Meds:  acetaminophen  1,000 mg Oral Q8H   ascorbic acid  500 mg Oral Daily   atorvastatin  40 mg Oral QPM   Chlorhexidine Gluconate Cloth  6 each Topical Daily   escitalopram  10 mg Oral Daily   feeding supplement (GLUCERNA SHAKE)  237 mL Oral TID BM   ferrous sulfate  325 mg Oral Q breakfast   finasteride  5 mg Oral Daily   furosemide  40 mg Oral BID   hydrocortisone  5 mg Oral BID   insulin aspart  0-15 Units Subcutaneous TID WC   insulin aspart  0-5 Units Subcutaneous QHS   insulin glargine-yfgn  24 Units Subcutaneous QHS   leptospermum manuka honey  1 Application Topical Daily   metroNIDAZOLE  500 mg Oral Q12H   midodrine  10 mg Oral TID with meals   multivitamin with minerals  1 tablet Oral Daily   nutrition supplement (JUVEN)  1 packet Oral BID BM   nystatin   Topical BID   oxyCODONE  15 mg Oral Q12H   pantoprazole  40 mg Oral Daily   saccharomyces boulardii  250 mg Oral BID   senna-docusate  1 tablet Oral BID   sodium chloride flush  5 mL Intracatheter Q8H   tamsulosin  0.4 mg Oral Daily   vitamin A  10,000 Units Oral Daily   zinc sulfate  220 mg Oral Daily   Continuous Infusions:  ceftazidime-avibactam (AVYCAZ) 2.5 g in dextrose 5 % 50 mL IVPB 2.5 g (06/18/23 0606)   PRN Meds:.acetaminophen **OR** acetaminophen, albuterol, mouth rinse, oxyCODONE, sodium chloride flush  Current Outpatient Medications  Medication Instructions   acetaminophen (TYLENOL) 1,000 mg, Oral, Every 8 hours   albuterol (PROVENTIL) 2.5 mg, Nebulization, Every 4 hours PRN   albuterol (VENTOLIN HFA) 108 (90 Base) MCG/ACT inhaler 2 puffs, Inhalation, Every 4 hours PRN   Amino Acids-Protein Hydrolys (FEEDING SUPPLEMENT, PRO-STAT SUGAR FREE 64,) LIQD 30 mLs, Oral, Daily   apixaban (ELIQUIS) 5 mg, Oral, 2 times daily   atorvastatin (LIPITOR) 40 mg, Oral, Every evening   B Complex-C (B  COMPLEX-VITAMIN C PO) 1 tablet, Oral, Daily, B complex/Vitamin C/Folic acid   bisacodyl (DULCOLAX) 10 mg, Rectal, Daily PRN   diclofenac Sodium (VOLTAREN) 2 g, Topical, See admin instructions, "Apply to bilateral knees topically once time a day for pain. Apply 2 g to affected area 3 times daily"   docusate sodium (COLACE) 100 mg, Oral, 2 times daily   escitalopram (LEXAPRO) 10 mg, Oral, Daily   ferrous sulfate 325 mg, Oral, Daily with breakfast   finasteride (PROSCAR) 5 mg, Oral, Daily   folic acid (FOLVITE) 1 mg, Oral, Daily   gabapentin (NEURONTIN) 300 mg, Oral, Daily at bedtime   HumaLOG KwikPen 0-15 Units, Subcutaneous, See admin instructions, Inject 0-15 units three times daily before meals per sliding scale:<BR>BS < 60 call MD<BR>0 - 100 : 0 units<BR>101-150 : 3 units<BR>151-200 : 4 units<BR>201-250 : 7 units<BR>251-300 : 9 units<BR>301-350 : 12 units<BR>351-400 : 15 units <BR>BS > 400 call MD<BR>   hydrocortisone (CORTEF) 5 mg, Oral, 2 times daily   insulin  glargine-yfgn (SEMGLEE) 20 Units, Subcutaneous, Daily at bedtime   melatonin 5 mg, Oral, Daily-1800   midodrine (PROAMATINE) 10 mg, Oral, 3 times daily with meals   nystatin (MYCOSTATIN/NYSTOP) powder Topical, 3 times daily   oxycodone (OXY-IR) 5 mg, Oral, Every 4 hours PRN   oxyCODONE (OXYCONTIN) 15 mg, Oral, Every 12 hours   pantoprazole (PROTONIX) 40 mg, Oral, Daily   saccharomyces boulardii (FLORASTOR) 250 mg, Oral, 2 times daily   senna-docusate (SENOKOT-S) 8.6-50 MG tablet 1 tablet, Oral, 2 times daily   tamsulosin (FLOMAX) 0.4 mg, Oral, Daily    Diet Orders (From admission, onward)     Start     Ordered   06/12/23 1403  Diet Carb Modified Fluid consistency: Thin; Room service appropriate? Yes with Assist  Diet effective now       Question Answer Comment  Diet-HS Snack? Nothing   Calorie Level Medium 1600-2000   Fluid consistency: Thin   Room service appropriate? Yes with Assist      06/12/23 1402             DVT prophylaxis: SCDs Start: 06/04/23 1157   Lab Results  Component Value Date   PLT 128 (L) 06/18/2023      Code Status: Full Code  Family Communication: no family at bedside   Status is: Inpatient  Remains inpatient appropriate because: severity of illness  Level of care: Telemetry Medical  Consultants:  Urology  ID  Objective: Vitals:   06/17/23 2044 06/18/23 0500 06/18/23 0558 06/18/23 0836  BP: 103/68  107/72 110/75  Pulse: 77  78 86  Resp: 17  20 17   Temp: 97.6 F (36.4 C)  98.3 F (36.8 C) 98.3 F (36.8 C)  TempSrc: Oral  Oral Oral  SpO2: 100%  99% 97%  Weight:  108.6 kg    Height:        Intake/Output Summary (Last 24 hours) at 06/18/2023 1116 Last data filed at 06/18/2023 0935 Gross per 24 hour  Intake 2264.1 ml  Output 3700 ml  Net -1435.9 ml   Wt Readings from Last 3 Encounters:  06/18/23 108.6 kg  05/12/23 115 kg  04/19/23 109.3 kg    Examination:  Constitutional: NAD Eyes: no scleral icterus ENMT: Mucous membranes are moist.  Neck: normal, supple Respiratory: clear to auscultation bilaterally, no wheezing, no crackles.  Cardiovascular: Regular rate and rhythm, no murmurs / rubs / gallops. No LE edema.  Abdomen: non distended, no tenderness. Bowel sounds positive.  Musculoskeletal: no clubbing / cyanosis.   Data Reviewed: I have independently reviewed following labs and imaging studies   CBC Recent Labs  Lab 06/14/23 0220 06/15/23 0311 06/17/23 0338 06/18/23 0220  WBC 9.3 7.0 6.3 6.4  HGB 9.4* 9.5* 9.0* 8.7*  HCT 30.0* 30.2* 29.7* 28.3*  PLT 165 138* 132* 128*  MCV 84.7 86.5 86.6 88.2  MCH 26.6 27.2 26.2 27.1  MCHC 31.3 31.5 30.3 30.7  RDW 22.7* 23.0* 22.5* 22.3*  LYMPHSABS 1.4 0.9  --  1.2  MONOABS 1.2* 0.4  --  0.7  EOSABS 1.0* 0.5  --  0.5  BASOSABS 0.1 0.3*  --  0.1    Recent Labs  Lab 06/13/23 0327 06/14/23 0220 06/15/23 0311 06/17/23 0338 06/18/23 0220  NA 131* 128* 134* 131* 133*  K 3.4* 3.5 3.8 3.8  3.7  CL 94* 92* 94* 95* 95*  CO2 27 28 27 28 27   GLUCOSE 161* 140* 142* 162* 150*  BUN 30* 34* 31* 30* 30*  CREATININE 0.69 0.95 0.82 0.79 0.93  CALCIUM 8.4* 8.1* 8.6* 8.4* 8.4*  MG  --  1.6*  --   --   --     ------------------------------------------------------------------------------------------------------------------ No results for input(s): "CHOL", "HDL", "LDLCALC", "TRIG", "CHOLHDL", "LDLDIRECT" in the last 72 hours.  Lab Results  Component Value Date   HGBA1C 7.0 (H) 04/02/2023   ------------------------------------------------------------------------------------------------------------------ No results for input(s): "TSH", "T4TOTAL", "T3FREE", "THYROIDAB" in the last 72 hours.  Invalid input(s): "FREET3"  Cardiac Enzymes No results for input(s): "CKMB", "TROPONINI", "MYOGLOBIN" in the last 168 hours.  Invalid input(s): "CK" ------------------------------------------------------------------------------------------------------------------    Component Value Date/Time   BNP 2,911.5 (H) 06/04/2023 0646    CBG: Recent Labs  Lab 06/17/23 0835 06/17/23 1227 06/17/23 1601 06/17/23 2044 06/18/23 0832  GLUCAP 184* 170* 160* 163* 155*    No results found for this or any previous visit (from the past 240 hour(s)).   Radiology Studies: No results found.   Pamella Pert, MD, PhD [facility]  Between 7 am - 7 pm I am available, please contact me via Amion (for emergencies) or Securechat (non urgent messages)  Between 7 pm - 7 am I am not available, please contact night coverage MD/APP via Amion

## 2023-06-19 DIAGNOSIS — R569 Unspecified convulsions: Secondary | ICD-10-CM | POA: Diagnosis not present

## 2023-06-19 DIAGNOSIS — E876 Hypokalemia: Secondary | ICD-10-CM | POA: Diagnosis not present

## 2023-06-19 DIAGNOSIS — Z8619 Personal history of other infectious and parasitic diseases: Secondary | ICD-10-CM | POA: Diagnosis not present

## 2023-06-19 LAB — CBC
HCT: 28.8 % — ABNORMAL LOW (ref 39.0–52.0)
Hemoglobin: 8.8 g/dL — ABNORMAL LOW (ref 13.0–17.0)
MCH: 26.4 pg (ref 26.0–34.0)
MCHC: 30.6 g/dL (ref 30.0–36.0)
MCV: 86.5 fL (ref 80.0–100.0)
Platelets: 145 10*3/uL — ABNORMAL LOW (ref 150–400)
RBC: 3.33 MIL/uL — ABNORMAL LOW (ref 4.22–5.81)
RDW: 22 % — ABNORMAL HIGH (ref 11.5–15.5)
WBC: 5.9 10*3/uL (ref 4.0–10.5)
nRBC: 0 % (ref 0.0–0.2)

## 2023-06-19 LAB — COMPREHENSIVE METABOLIC PANEL
ALT: 15 U/L (ref 0–44)
AST: 20 U/L (ref 15–41)
Albumin: 2.4 g/dL — ABNORMAL LOW (ref 3.5–5.0)
Alkaline Phosphatase: 71 U/L (ref 38–126)
Anion gap: 9 (ref 5–15)
BUN: 27 mg/dL — ABNORMAL HIGH (ref 8–23)
CO2: 27 mmol/L (ref 22–32)
Calcium: 8.3 mg/dL — ABNORMAL LOW (ref 8.9–10.3)
Chloride: 95 mmol/L — ABNORMAL LOW (ref 98–111)
Creatinine, Ser: 0.81 mg/dL (ref 0.61–1.24)
GFR, Estimated: >60 mL/min (ref >60)
Glucose, Bld: 197 mg/dL — ABNORMAL HIGH (ref 70–99)
Potassium: 3.8 mmol/L (ref 3.5–5.1)
Sodium: 131 mmol/L — ABNORMAL LOW (ref 135–145)
Total Bilirubin: 0.7 mg/dL (ref 0.3–1.2)
Total Protein: 5.5 g/dL — ABNORMAL LOW (ref 6.5–8.1)

## 2023-06-19 LAB — MAGNESIUM: Magnesium: 1.9 mg/dL (ref 1.7–2.4)

## 2023-06-19 LAB — GLUCOSE, CAPILLARY
Glucose-Capillary: 191 mg/dL — ABNORMAL HIGH (ref 70–99)
Glucose-Capillary: 103 mg/dL — ABNORMAL HIGH (ref 70–99)

## 2023-06-19 MED ORDER — ADULT MULTIVITAMIN W/MINERALS CH: 1.0000 | ORAL_TABLET | Freq: Every day | ORAL | Status: DC

## 2023-06-19 MED ORDER — GLUCERNA SHAKE PO LIQD: 237.0000 mL | Freq: Three times a day (TID) | ORAL | Status: DC

## 2023-06-19 MED ORDER — METRONIDAZOLE 500 MG PO TABS: 500.0000 mg | ORAL_TABLET | Freq: Two times a day (BID) | ORAL | 0 refills | Status: AC

## 2023-06-19 MED ORDER — VITAMIN A 3 MG (10000 UNIT) PO CAPS: 10000.0000 [IU] | ORAL_CAPSULE | Freq: Every day | ORAL | Status: DC

## 2023-06-19 MED ORDER — OXYCODONE HCL 5 MG PO CAPS: 5.0000 mg | ORAL_CAPSULE | ORAL | 0 refills | Status: DC | PRN

## 2023-06-19 MED ORDER — SODIUM CHLORIDE 0.9 % IV SOLN
2.5000 g | Freq: Three times a day (TID) | INTRAVENOUS | Status: DC
  Filled 2023-06-19 (×2): qty 12

## 2023-06-19 MED ORDER — APIXABAN 5 MG PO TABS: 5.0000 mg | ORAL_TABLET | Freq: Two times a day (BID) | ORAL | Status: DC

## 2023-06-19 MED ORDER — SODIUM CHLORIDE 0.9 % IV SOLN: 2.5000 g | Freq: Three times a day (TID) | INTRAVENOUS | 0 refills | Status: AC

## 2023-06-19 MED ORDER — OXYCODONE HCL ER 15 MG PO T12A: 15.0000 mg | EXTENDED_RELEASE_TABLET | Freq: Two times a day (BID) | ORAL | 0 refills | Status: DC

## 2023-06-19 MED ORDER — MEDIHONEY WOUND/BURN DRESSING EX PSTE: 1.0000 | PASTE | Freq: Every day | CUTANEOUS | Status: DC

## 2023-06-19 MED ORDER — FUROSEMIDE 40 MG PO TABS: 40.0000 mg | ORAL_TABLET | Freq: Every day | ORAL | Status: DC

## 2023-06-19 MED ORDER — ZINC SULFATE 220 (50 ZN) MG PO CAPS: 220.0000 mg | ORAL_CAPSULE | Freq: Every day | ORAL | Status: DC

## 2023-06-19 NOTE — Plan of Care (Signed)
  Problem: Education: Goal: Knowledge of General Education information will improve Description: Including pain rating scale, medication(s)/side effects and non-pharmacologic comfort measures Outcome: Progressing   Problem: Health Behavior/Discharge Planning: Goal: Ability to manage health-related needs will improve Outcome: Progressing   Problem: Clinical Measurements: Goal: Ability to maintain clinical measurements within normal limits will improve Outcome: Progressing Goal: Will remain free from infection Outcome: Progressing Goal: Diagnostic test results will improve Outcome: Progressing Goal: Respiratory complications will improve Outcome: Progressing Goal: Cardiovascular complication will be avoided Outcome: Progressing   Problem: Activity: Goal: Risk for activity intolerance will decrease Outcome: Progressing   Problem: Nutrition: Goal: Adequate nutrition will be maintained Outcome: Progressing   Problem: Coping: Goal: Level of anxiety will decrease Outcome: Progressing   Problem: Elimination: Goal: Will not experience complications related to bowel motility Outcome: Progressing Goal: Will not experience complications related to urinary retention Outcome: Progressing   Problem: Pain Managment: Goal: General experience of comfort will improve Outcome: Progressing   Problem: Safety: Goal: Ability to remain free from injury will improve Outcome: Progressing   Problem: Skin Integrity: Goal: Risk for impaired skin integrity will decrease Outcome: Progressing   Problem: Education: Goal: Ability to demonstrate management of disease process will improve Outcome: Progressing Goal: Ability to verbalize understanding of medication therapies will improve Outcome: Progressing Goal: Individualized Educational Video(s) Outcome: Progressing   Problem: Activity: Goal: Capacity to carry out activities will improve Outcome: Progressing   Problem: Cardiac: Goal:  Ability to achieve and maintain adequate cardiopulmonary perfusion will improve Outcome: Progressing   Problem: Coping: Goal: Ability to adjust to condition or change in health will improve Outcome: Progressing   Problem: Fluid Volume: Goal: Ability to maintain a balanced intake and output will improve Outcome: Progressing   Problem: Health Behavior/Discharge Planning: Goal: Ability to identify and utilize available resources and services will improve Outcome: Progressing Goal: Ability to manage health-related needs will improve Outcome: Progressing   Problem: Metabolic: Goal: Ability to maintain appropriate glucose levels will improve Outcome: Progressing   Problem: Nutritional: Goal: Maintenance of adequate nutrition will improve Outcome: Progressing Goal: Progress toward achieving an optimal weight will improve Outcome: Progressing   Problem: Skin Integrity: Goal: Risk for impaired skin integrity will decrease Outcome: Progressing   Problem: Tissue Perfusion: Goal: Adequacy of tissue perfusion will improve Outcome: Progressing

## 2023-06-19 NOTE — Progress Notes (Signed)
Pt has a small reddened scab 0.5 cm that he states he picks at mid to left chest area covered with rectangular foam. Left knee has a 1 cm brown scab which was covered with xeroform and 4x4 foam (change every other day). Right pretibial area is really healed but covered with xeroform and 4x4 foam. R foot full thickness surgical wound cleansed with Vashe and Vashe moistened gauze placed, covered with 2x2 and dry 4x4, wrapped in kerlix dated and placed in Prevalon boot. Elmon Kirschner, NP completed the scrotal wounds per his note and ABD placed over. Interdry AG single layer to bilateral groins which were red and yeasty but are now clearing nicely.

## 2023-06-19 NOTE — Progress Notes (Signed)
[facility] here to transport pt with all belongings and paperwork

## 2023-06-19 NOTE — Discharge Summary (Signed)
Physician Discharge Summary  Peter Yang YQM:[id number] DOB: [birth year] DOA: 06/04/2023  PCP: Patient, No Pcp Per  Admit date: 06/04/2023 Discharge date: 06/19/2023  Admitted From: SNF Disposition:  SNF  Recommendations for Outpatient Follow-up:  Follow up with PCP in 1-2 weeks Follow up with Urology as scheduled on 07/28/2023, 9:45 am with Dr Marlou Porch Resume Eliquis on Monday, 06/23/2023 if not having recurrent hematuria  Home Health: none Equipment/Devices: none  Discharge Condition: stable CODE STATUS: Full code  HPI: Per admitting MD, Peter Yang is a 67 y.o. 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 Patient Yang further complicated by  recent diagnosis of Fournier's gangrene s/p debridement x 2 during hospitalization 6/12-6/19/2024 followed by Yang of iv daptomycin and ertapenem with end dated of 05/20/23.[name] Patient now returns from NH s/p fall with head strike after attempting to get remote that had fallen on floor. On evaluation in ED patient appeared well but was noted to have surgical wound dehiscence and poor healing of perineal with concern for infection. Patient  notes no fever no chills, denies sob/chest /pain /vomiting / fever Peter Yang. Notes he lives alone. He is unable to describe the kind of wound care he employs when taking care of his scrotal surgical incision. He is not very forthcoming regarding care of his wound. He is however willing to be placed in a rehab for wound care.  Hospital Yang / Discharge diagnoses: Principal Problem:   Wound dehiscence in puerperium, perineal Active Problems:   UTI (urinary tract infection) due to urinary indwelling  catheter (HCC)   PAF (paroxysmal atrial fibrillation) (HCC)   Type 2 diabetes mellitus with diabetic peripheral angiopathy without gangrene, without long-term current use of insulin (HCC)   Hematuria   Pleural effusion, bilateral   Soft tissue infection   Malnutrition of moderate degree   History of MDR Pseudomonas aeruginosa infection   Principal problem History of Fournier's gangrene, persistent scrotal surgical wound infection -urology consulted and following, he was taken to the OR on 8/18.  He grew drug-resistant Pseudomonas, currently on Avycaz per ID. Plan for total of 3 weeks, end of treatment 06/28/2023.  PICC line has been placed. Pain seems to be controlled with OxyContin as well as oxycodone   Active problems CAUTI versus asymptomatic bacteriuria - Suprapubic catheter now placed by IR 8/18.  - Urine culture with Pseudomonas. Culture with multidrug resistance but without urinary symptoms, no directed treatment for this per ID. Acute on chronic combined heart failure, with bilateral pleural effusions in the setting of hypoalbuminemia and malnutrition - CXR showed Interval improvement in aeration of the lungs since 06/04/2023. He was started on IV lasix 40 mg BID, with good output.  Diuresed about 32 lit since admission.  He remains on RA. Transitioned to oral lasix 40 mg. Creatinine remains stable .  Hyponatremia- Much improved.  Sodium remains overall stable Hematuria - with normocytic anemia.  Discussed with urology, this is likely traumatic with patient's movement, now resolving.  Recommend holding Eliquis for at least couple additional days and tentatively resume on Monday assuming it still resolved at that time.  He has follow-up with urology on October 7 Anemia of chronic blood loss -monitor, hemoglobin stable Right foot wound -  No exudate or odor at this time.  Continue regular podiatry follow up, optimize nutritional status and glycemic control. Keep covered, continue daily wound  care per WOC. Offload with prevalon boot. X rays of the foot  show Postsurgical and degenerative changes. Recommend outpatient podiatry follow up .  Stage 2 sacral pressure injury - POA, offload, foam dressing w/medihoney.  IDT2DM - Last HbA1c 7% shows adequate glycemic control to assist wound healing.  Continue home regimen on discharge  Sepsis ruled out   Discharge Instructions  Discharge Instructions     Home infusion instructions   Complete by: As directed    Planning to discharge back to SNF   Instructions: Flushing of vascular access device: 0.9% NaCl pre/post medication administration and prn patency; Heparin 100 u/ml, 5ml for implanted ports and Heparin 10u/ml, 5ml for all other central venous catheters.      Allergies as of 06/19/2023       Reactions   Sglt2 Inhibitors Other (See Comments)   Fournier's gangrene        Medication List     TAKE these medications    acetaminophen 500 MG tablet Commonly known as: TYLENOL Take 2 tablets (1,000 mg total) by mouth every 8 (eight) hours.   albuterol 108 (90 Base) MCG/ACT inhaler Commonly known as: VENTOLIN HFA Inhale 2 puffs into the lungs every 4 (four) hours as needed for wheezing or shortness of breath.   albuterol (2.5 MG/3ML) 0.083% nebulizer solution Commonly known as: PROVENTIL Take 2.5 mg by nebulization every 4 (four) hours as needed for wheezing or shortness of breath.   apixaban 5 MG Tabs tablet Commonly known as: ELIQUIS Take 1 tablet (5 mg total) by mouth 2 (two) times daily. Start taking on: June 23, 2023 What changed: These instructions start on June 23, 2023. If you are unsure what to do until then, ask your doctor or other care provider.   atorvastatin 80 MG tablet Commonly known as: LIPITOR Take 0.5 tablets (40 mg total) by mouth every evening.   B COMPLEX-VITAMIN C PO Take 1 tablet by mouth daily. B complex/Vitamin C/Folic acid   bisacodyl 10 MG suppository Commonly known as:  DULCOLAX Place 1 suppository (10 mg total) rectally daily as needed for moderate constipation. What changed: reasons to take this   ceftazidime-avibactam 2.5 g in sodium chloride 0.9 % 50 mL Inject 2.5 g into the vein every 8 (eight) hours for 16 days. Indication:  MDR PsA + Kleb PNA Fournier's gangrene Last Day of Therapy:  06/28/23 Labs - Once weekly:  CBC/D and BMP, Labs - Once weekly: ESR and CRP Method of administration: IVPB - administer each dose over 2 hours Please leave PIC in place until doctor has seen patient or been notified   diclofenac Sodium 1 % Gel Commonly known as: VOLTAREN Apply 2 g topically See admin instructions. "Apply to bilateral knees topically once time a day for pain. Apply 2 g to affected area 3 times daily"   docusate sodium 100 MG capsule Commonly known as: COLACE Take 1 capsule (100 mg total) by mouth 2 (two) times daily.   escitalopram 10 MG tablet Commonly known as: LEXAPRO Take 10 mg by mouth daily.   feeding supplement (GLUCERNA SHAKE) Liqd Take 237 mLs by mouth 3 (three) times daily between meals.   feeding supplement (PRO-STAT SUGAR FREE 64) Liqd Take 30 mLs by mouth daily.   ferrous sulfate 325 (65 FE) MG tablet Take 1 tablet (325 mg total) by mouth daily  with breakfast.   finasteride 5 MG tablet Commonly known as: PROSCAR Take 5 mg by mouth daily.   folic acid 1 MG tablet Commonly known as: FOLVITE Take 1 tablet (1 mg total) by mouth daily.   furosemide 40 MG tablet Commonly known as: LASIX Take 1 tablet (40 mg total) by mouth daily.   gabapentin 300 MG capsule Commonly known as: NEURONTIN Take 1 capsule (300 mg total) by mouth at bedtime.   HumaLOG KwikPen 100 UNIT/ML KwikPen Generic drug: insulin lispro Inject 0-15 Units into the skin See admin instructions. Inject 0-15 units three times daily before meals per sliding scale: BS < 60 call MD 0 - 100 : 0 units 101-150 : 3 units 151-200 : 4 units 201-250 : 7 units 251-300  : 9 units 301-350 : 12 units 351-400 : 15 units  BS > 400 call MD   hydrocortisone 5 MG tablet Commonly known as: CORTEF Take 1 tablet (5 mg total) by mouth 2 (two) times daily.   insulin glargine-yfgn 100 UNIT/ML injection Commonly known as: SEMGLEE Inject 0.2 mLs (20 Units total) into the skin at bedtime.   leptospermum manuka honey Pste paste Apply 1 Application topically daily. Start taking on: June 20, 2023   melatonin 5 MG Tabs Take 1 tablet (5 mg total) by mouth daily at 6 PM. What changed: when to take this   metroNIDAZOLE 500 MG tablet Commonly known as: Flagyl Take 1 tablet (500 mg total) by mouth 2 (two) times daily for 15 days.   midodrine 10 MG tablet Commonly known as: PROAMATINE Take 1 tablet (10 mg total) by mouth with breakfast, with lunch, and with evening meal.   multivitamin with minerals Tabs tablet Take 1 tablet by mouth daily. Start taking on: June 20, 2023   nystatin powder Commonly known as: MYCOSTATIN/NYSTOP Apply topically 3 (three) times daily. What changed:  how much to take when to take this   oxyCODONE 15 mg 12 hr tablet Commonly known as: OXYCONTIN Take 1 tablet (15 mg total) by mouth every 12 (twelve) hours.   oxycodone 5 MG capsule Commonly known as: OXY-IR Take 1 capsule (5 mg total) by mouth every 4 (four) hours as needed (moderate to severe pain).   pantoprazole 40 MG tablet Commonly known as: PROTONIX Take 1 tablet (40 mg total) by mouth daily.   saccharomyces boulardii 250 MG capsule Commonly known as: FLORASTOR Take 250 mg by mouth 2 (two) times daily.   senna-docusate 8.6-50 MG tablet Commonly known as: Senokot-S Take 1 tablet by mouth 2 (two) times daily.   tamsulosin 0.4 MG Caps capsule Commonly known as: FLOMAX Take 0.4 mg by mouth daily.   vitamin A 3 MG (10000 UNITS) capsule Take 1 capsule (10,000 Units total) by mouth daily. Start taking on: June 20, 2023   zinc sulfate 220 (50 Zn) MG  capsule Take 1 capsule (220 mg total) by mouth daily. Start taking on: June 20, 2023               Home Infusion Instuctions  (From admission, onward)           Start     Ordered   06/19/23 0000  Home infusion instructions       Comments: Planning to discharge back to SNF  Question:  Instructions  Answer:  Flushing of vascular access device: 0.9% NaCl pre/post medication administration and prn patency; Heparin 100 u/ml, 5ml for implanted ports and Heparin 10u/ml, 5ml for all other central  venous catheters.   06/19/23 1129           Consultations: Urology   Procedures/Studies:  Varney Biles Foot 2 Views Right  Result Date: 06/12/2023 CLINICAL DATA:  Infection. EXAM: RIGHT FOOT - 2 VIEW COMPARISON:  January 14, 2023. 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 Severe degenerative changes seen involving the first metatarsophalangeal joint. No acute fracture or dislocation is noted. Mild posterior calcaneal spurring is noted. Vascular calcifications are noted. IMPRESSION: Postsurgical and degenerative changes as described above. No acute abnormality seen. Electronically Signed   By: Lupita Raider M.D.   On: 06/12/2023 15:59   [place] EKG SITE RITE  Result Date: 06/12/2023 If [facility] image not attached, placement could not be confirmed due to current cardiac rhythm.  DG CHEST PORT 1 VIEW  Result Date: 06/11/2023 CLINICAL DATA:  Dyspnea EXAM: PORTABLE CHEST - 1 VIEW COMPARISON:  06/04/2023 FINDINGS: Cardiomediastinal silhouette and pulmonary vasculature are within normal limits. Mild hazy opacity at the right lung base likely combination of atelectasis and small pleural effusion. Minimal left pleural effusion likely also present. IMPRESSION: 1. Interval improvement in aeration of the lungs since 06/04/2023. 2. Small bilateral pleural effusions and right basilar atelectasis still remains. Electronically Signed   By: Acquanetta Belling M.D.   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 Suprapubic bladder drainage catheter requested for urinary diversion. EXAM: CT GUIDE TUBE PLACEMENT COMPARISON:  None Available. MEDICATIONS: None ANESTHESIA/SEDATION: None Moderate (conscious) sedation was employed during this procedure. A total of Versed 3.0 mg and Fentanyl 200 mcg was administered intravenously by radiology nursing. Moderate Sedation Time: 36 minutes. [name] level of consciousness and vital signs were monitored continuously by radiology nursing throughout the procedure under my direct supervision. CONTRAST:  None FLUOROSCOPY TIME:  CT guidance utilized. COMPLICATIONS: None immediate. PROCEDURE: Informed written consent was obtained from the patient after a thorough discussion of the procedural risks, benefits and alternatives. All questions were addressed. Maximal Sterile Barrier Technique was utilized including caps, mask, sterile gowns, sterile gloves, sterile drape, hand hygiene and skin antiseptic. A timeout was performed prior to the initiation of the procedure. Via an indwelling Foley catheter, the bladder was gently distended with sterile saline by gravity bag infusion. CT of the pelvis was performed in a supine position. Under CT guidance, an 18 gauge trocar needle was advanced from a midline suprapubic approach into the bladder lumen. The needle was removed over a guidewire. The percutaneous tract was dilated over the wire and a 14 French pigtail drainage catheter advanced into the bladder. Catheter position was confirmed by CT. The catheter was flushed with saline and connected to a gravity drainage bag. It was secured at the skin exit site with a Prolene retention suture and adhesive StatLock device. FINDINGS: After puncture of the bladder and placement of a suprapubic drainage catheter, there is return grossly bloody urine.  IMPRESSION: CT-guided placement of 14 French suprapubic bladder drainage catheter. Electronically Signed   By: Irish Lack M.D.   On: 06/09/2023 14:20   CT Cervical Spine Wo Contrast  Result Date: 06/04/2023 CLINICAL DATA:  Neck trauma. EXAM: CT CERVICAL SPINE WITHOUT CONTRAST TECHNIQUE: Multidetector CT imaging of the cervical spine was performed without intravenous contrast. Multiplanar CT image reconstructions were also generated. RADIATION DOSE REDUCTION: This exam was performed according to the departmental dose-optimization program which includes automated exposure control,  adjustment of the mA and/or kV according to patient size and/or use of iterative reconstruction technique. COMPARISON:  None available FINDINGS: Alignment: No significant listhesis is present. Cervical lordosis is normal Skull base and vertebrae: Craniocervical junction is within normal limits. Prominent pannus formation is noted about the dens. Ample space is present at the craniocervical junction. The vertebral body heights are normal. No acute fractures are present. Soft tissues and spinal canal: No prevertebral fluid or swelling. No visible canal hematoma. Disc levels: Multilevel uncovertebral and facet hypertrophy is present. Upper chest: The lung apices are clear. The thoracic inlet is within normal limits. IMPRESSION: 1. No acute fracture or traumatic subluxation. 2. Multilevel degenerative changes of the cervical spine as described. Electronically Signed   By: Marin Roberts M.D.   On: 06/04/2023 08:59   CT ABDOMEN PELVIS W CONTRAST  Result Date: 06/04/2023 CLINICAL DATA:  History of Fournier's gangrene status post incision and drainage presenting after fall to left side EXAM: CT ABDOMEN AND PELVIS WITH CONTRAST TECHNIQUE: Multidetector CT imaging of the abdomen and pelvis was performed using the standard protocol following bolus administration of intravenous contrast. RADIATION DOSE REDUCTION: This exam was  performed according to the departmental dose-optimization program which includes automated exposure control, adjustment of the mA and/or kV according to patient size and/or use of iterative reconstruction technique. CONTRAST:  75mL OMNIPAQUE IOHEXOL 350 MG/ML SOLN COMPARISON:  CT abdomen and pelvis dated 04/01/2023 FINDINGS: Lower chest: No focal consolidation or pulmonary nodule in the lung bases. Increased large bilateral pleural effusions. Partially imaged heart size is normal. Coronary artery calcifications. Hepatobiliary: No focal hepatic lesions. No intra or extrahepatic biliary ductal dilation. Cholelithiasis. Pancreas: No focal lesions or main ductal dilation. Spleen: Normal in size without focal abnormality. Adrenals/Urinary Tract: No adrenal nodules. Similar left hydroureteronephrosis to the level of the decompressed urinary bladder. New increased hyperattenuation of the distal left ureter (3:75). No right hydronephrosis. No suspicious renal masses or calculi. Urinary catheter in-situ. Stomach/Bowel: Normal appearance of the stomach. No bowel wall thickening. Mildly dilated rectum contains inspissated stool. Normal appendix. Vascular/Lymphatic: Aortic atherosclerosis. No enlarged abdominal or pelvic lymph nodes. Reproductive: Prostate is unremarkable. Other: Increased small volume ascites. No free air or fluid collection. Musculoskeletal: No acute or abnormal lytic or blastic osseous lesions. Diffuse body wall edema. Previously noted subcutaneous emphysema is no longer seen in the partially imaged pubic region. IMPRESSION: 1. Similar left hydroureteronephrosis to the level of the decompressed urinary bladder. New increased hyperattenuation of the distal left ureter, which may represent blood products or mural thickening. Recommend consultation to Urology with cystoscopic evaluation as clinically indicated. 2. Increased large bilateral pleural effusions. Increased small volume ascites. 3. Mildly dilated  rectum contains inspissated stool. 4. Previously noted subcutaneous emphysema is no longer seen in the partially imaged pubic region. 5. Aortic Atherosclerosis (ICD10-I70.0). Coronary artery calcifications. Assessment for potential risk factor modification, dietary therapy or pharmacologic therapy may be warranted, if clinically indicated. Electronically Signed   By: Agustin Cree M.D.   On: 06/04/2023 08:55   CT Head Wo Contrast  Result Date: 06/04/2023 CLINICAL DATA:  Head trauma. EXAM: CT HEAD WITHOUT CONTRAST TECHNIQUE: Contiguous axial images were obtained from the base of the skull through the vertex without intravenous contrast. RADIATION DOSE REDUCTION: This exam was performed according to the departmental dose-optimization program which includes automated exposure control, adjustment of the mA and/or kV according to patient size and/or use of iterative reconstruction technique. COMPARISON:  CT head without contrast 05/06/2023. MR head without  and with contrast 05/06/2023. 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 Associated volume loss is noted. No acute infarct, hemorrhage, or mass lesion is present. The ventricles are of normal size. No significant extraaxial fluid collection is present. The brainstem and cerebellum are within normal limits. Midline structures are within normal limits. Vascular: Atherosclerotic calcifications are present within the cavernous internal carotid arteries bilaterally and at the dural margin of both vertebral arteries. No hyperdense vessel is present. Skull: Left parietal scalp soft tissue swelling is present. No underlying fracture is present. Calvarium is intact. Sinuses/Orbits: The paranasal sinuses and mastoid air cells are clear. A right lens replacement is present. Globes and orbits are within normal limits. IMPRESSION: 1. Left parietal scalp soft tissue swelling  without underlying fracture. 2. No acute intracranial abnormality or significant interval change. 3. 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 Electronically Signed   By: Marin Roberts M.D.   On: 06/04/2023 08:47   DG Pelvis Portable  Result Date: 06/04/2023 CLINICAL DATA:  Level 2 fall on blood thinners. EXAM: PORTABLE PELVIS 1 VIEWS COMPARISON:  Pelvis MRI 04/04/2023 FINDINGS: Limited by underpenetration and leftward rotation. No fracture, dislocation, or gross erosion. IMPRESSION: Limited study without acute fracture or hip dislocation. Electronically Signed   By: Tiburcio Pea M.D.   On: 06/04/2023 06:38   DG Chest Portable 1 View  Result Date: 06/04/2023 CLINICAL DATA:  Level 2 trauma.  Fall.  Infection. EXAM: PORTABLE CHEST 1 VIEW COMPARISON:  05/06/2023 FINDINGS: The cardio pericardial silhouette is enlarged. Patchy mid and lower lung predominant airspace disease seen bilaterally. Left pleural effusion noted. No acute bony abnormality. Degenerative changes evident left shoulder. IMPRESSION: Patchy mid and lower lung predominant airspace disease with left pleural effusion. Imaging features could be related to edema or infection. Electronically Signed   By: Kennith Center M.D.   On: 06/04/2023 06:32     Subjective: - no chest pain, shortness of breath, no abdominal pain, nausea or vomiting.   Discharge Exam: BP 115/81   Pulse 80   Temp 98 F (36.7 C)   Resp 17   Ht 6' 2.02" (1.88 m)   Wt 108.7 kg   SpO2 100%   BMI 30.75 kg/m   General: Pt is alert, awake, not in acute distress Cardiovascular: RRR, S1/S2 +, no rubs, no gallops Respiratory: CTA bilaterally, no wheezing, no rhonchi Abdominal: Soft, NT, ND, bowel sounds + Extremities: no edema, no cyanosis    The results of significant diagnostics from this hospitalization (including imaging, microbiology, ancillary and laboratory) are  listed below for reference.     Microbiology: No results found for this or any previous visit (from the past 240 hour(s)).   Labs: Basic Metabolic Panel: Recent Labs  Lab 06/14/23 0220 06/15/23 0311 06/17/23 0338 06/18/23 0220 06/19/23 0312  NA 128* 134* 131* 133* 131*  K 3.5 3.8 3.8 3.7 3.8  CL 92* 94* 95* 95* 95*  CO2 28 27 28 27 27   GLUCOSE 140* 142* 162* 150* 197*  BUN 34* 31* 30* 30* 27*  CREATININE 0.95 0.82 0.79 0.93 0.81  CALCIUM 8.1* 8.6* 8.4* 8.4* 8.3*  MG 1.6*  --   --   --  1.9   Liver Function Tests: Recent Labs  Lab 06/19/23 0312  AST 20  ALT 15  ALKPHOS 71  BILITOT 0.7  PROT 5.5*  ALBUMIN  2.4*   CBC: Recent Labs  Lab 06/14/23 0220 06/15/23 0311 06/17/23 0338 06/18/23 0220 06/19/23 0312  WBC 9.3 7.0 6.3 6.4 5.9  NEUTROABS 5.7 5.0  --  3.9  --   HGB 9.4* 9.5* 9.0* 8.7* 8.8*  HCT 30.0* 30.2* 29.7* 28.3* 28.8*  MCV 84.7 86.5 86.6 88.2 86.5  PLT 165 138* 132* 128* 145*   CBG: Recent Labs  Lab 06/18/23 0832 06/18/23 1237 06/18/23 1637 06/18/23 2118 06/19/23 0752  GLUCAP 155* 171* 166* 211* 191*   Hgb A1c No results for input(s): "HGBA1C" in the last 72 hours. Lipid Profile No results for input(s): "CHOL", "HDL", "LDLCALC", "TRIG", "CHOLHDL", "LDLDIRECT" in the last 72 hours. Thyroid function studies No results for input(s): "TSH", "T4TOTAL", "T3FREE", "THYROIDAB" in the last 72 hours.  Invalid input(s): "FREET3" Urinalysis    Component Value Date/Time   COLORURINE RED (A) 06/04/2023 0652   APPEARANCEUR TURBID (A) 06/04/2023 0652   LABSPEC  06/04/2023 0652    TEST NOT REPORTED DUE TO COLOR INTERFERENCE OF URINE PIGMENT   PHURINE  06/04/2023 0652    TEST NOT REPORTED DUE TO COLOR INTERFERENCE OF URINE PIGMENT   GLUCOSEU (A) 06/04/2023 0652    TEST NOT REPORTED DUE TO COLOR INTERFERENCE OF URINE PIGMENT   HGBUR (A) 06/04/2023 0652    TEST NOT REPORTED DUE TO COLOR INTERFERENCE OF URINE PIGMENT   BILIRUBINUR (A) 06/04/2023 0652     TEST NOT REPORTED DUE TO COLOR INTERFERENCE OF URINE PIGMENT   KETONESUR (A) 06/04/2023 0652    TEST NOT REPORTED DUE TO COLOR INTERFERENCE OF URINE PIGMENT   PROTEINUR (A) 06/04/2023 0652    TEST NOT REPORTED DUE TO COLOR INTERFERENCE OF URINE PIGMENT   NITRITE (A) 06/04/2023 0652    TEST NOT REPORTED DUE TO COLOR INTERFERENCE OF URINE PIGMENT   LEUKOCYTESUR (A) 06/04/2023 0652    TEST NOT REPORTED DUE TO COLOR INTERFERENCE OF URINE PIGMENT    FURTHER DISCHARGE INSTRUCTIONS:   Get Medicines reviewed and adjusted: Please take all your medications with you for your next visit with your Primary MD   Laboratory/radiological data: Please request your Primary MD to go over all hospital tests and procedure/radiological results at the follow up, please ask your Primary MD to get all Hospital records sent to his/her office.   In some cases, they will be blood work, cultures and biopsy results pending at the time of your discharge. Please request that your primary care M.D. goes through all the records of your hospital data and follows up on these results.   Also Note the following: If you experience worsening of your admission symptoms, develop shortness of breath, life threatening emergency, suicidal or homicidal thoughts you must seek medical attention immediately by calling 911 or calling your MD immediately  if symptoms less severe.   You must read complete instructions/literature along with all the possible adverse reactions/side effects for all the Medicines you take and that have been prescribed to you. Take any new Medicines after you have completely understood and accpet all the possible adverse reactions/side effects.    Do not drive when taking Pain medications or sleeping medications (Benzodaizepines)   Do not take more than prescribed Pain, Sleep and Anxiety Medications. It is not advisable to combine anxiety,sleep and pain medications without talking with your primary care  practitioner   Special Instructions: If you have smoked or chewed Tobacco  in the last 2 yrs please stop smoking, stop any regular Alcohol  and or  any Recreational drug use.   Wear Seat belts while driving.   Please note: You were cared for by a hospitalist during your hospital stay. Once you are discharged, your primary care physician will handle any further medical issues. Please note that NO REFILLS for any discharge medications will be authorized once you are discharged, as it is imperative that you return to your primary care physician (or establish a relationship with a primary care physician if you do not have one) for your post hospital discharge needs so that they can reassess your need for medications and monitor your lab values.  Time coordinating discharge: 35 minutes  SIGNED:  Pamella Pert, MD, PhD 06/19/2023, 11:32 AM

## 2023-06-19 NOTE — Progress Notes (Signed)
   11 Days Post-Op Subjective: NAEON.  Sleeping on arrival.  Performed wound care with students today. 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 There was initially a pinhole sinus draining at the bottom of the right side suture.   Would culture (+) Kleb,Pseud. Broadly covered. UC (+) Pseudomonas  Taken back to OR by Dr. Annabell Howells for debridement 8/18  Signs of urethral fistula. SP Tube placed in IR 8/19. Exchange and dilate at 6 weeks with IR.   Foley catheter removed late last week.   Patient was been given Pyridium for 2 days to help assess for urethral fistula/leakage into wound bed.  I saw no evidence of this when performing wound care.  Stopping pyridium today. Small bladder capacity. Attributable to spasm. Still pyridium stained urine  Hematuria nearly resolved. Bladder hand irrigated. Cleared with 10cc sterile water. Should be able to restart Eliquis within the next 2 days if hematuria resolves.   If reliable wound care can be established, patient is cleared to go back to his facility from a urologic perspective.  He has never followed up with our office.  It is imperative that we receive a point of contact for his skilled nursing facility so that we can facilitate follow-up   #hydronephrosis Chronic in nature.  This has been noted on multiple CT images after adequate time provided for urinary decompression. Preserved renal function    Intake/Output from previous day: 08/28 0701 - 08/29 0700 In: 428.3 [P.O.:240; IV Piggyback:188.3] Out: 2500 [Urine:2500]  Intake/Output  this shift: No intake/output data recorded.  Physical Exam:  General: Alert and oriented CV: No cyanosis Lungs: equal chest rise Abdomen: Soft, NTND, no rebound or guarding Skin: bilateral scrotal incisions. Red beefy pink wound beds. No purulent drainage. Gu: Foley removed, SPT with hematuria  Lab Results: Recent Labs    06/17/23 0338 06/18/23 0220 06/19/23 0312  HGB 9.0* 8.7* 8.8*  HCT 29.7* 28.3* 28.8*   BMET Recent Labs    06/18/23 0220 06/19/23 0312  NA 133* 131*  K 3.7 3.8  CL 95* 95*  CO2 27 27  GLUCOSE 150* 197*  BUN 30* 27*  CREATININE 0.93 0.81  CALCIUM 8.4* 8.3*     Studies/Results: No results found.    LOS: 15 days   Elmon Kirschner, NP [facility] Pager: [phone]  06/19/2023, 10:25 AM

## 2023-06-19 NOTE — Progress Notes (Signed)
Paper prescription placed in folder with discharge paperwork

## 2023-06-19 NOTE — Progress Notes (Signed)
Report called to [facility] .[phone]. Spoke with nurse Reece Leader who will be taking care of pt in room 611 when he arrives. Nurse verbalized understanding of report and had no further questions. Pt currently in room waiting for [facility] pick up.

## 2023-06-19 NOTE — Progress Notes (Signed)
Attempted to call report. Got voicemail. Left voicemail to call back [phone]

## 2023-06-19 NOTE — Progress Notes (Signed)
PICC remains in for DC per Gherghe,MD

## 2023-06-19 NOTE — Progress Notes (Signed)
Per pt called [name] [phone] and let her know that pt had been transported to [facility].

## 2023-06-19 NOTE — TOC Transition Note (Signed)
Transition of Care [facility]) - CM/SW Discharge Note   Patient Details  Name: Peter Yang MRN: [id number] Date of Birth: [birth year]  Transition of Care [facility] CM/SW Contact:  Carley Hammed, LCSW Phone Number: 06/19/2023, 12:00 PM   Clinical Narrative:    Pt to be transported to [place] via [facility].  Nurse to call report to [phone]. Opt 0 Rm 611   Final next level of care: Skilled Nursing Facility Barriers to Discharge: Barriers Resolved   Patient Goals and CMS Choice   Choice offered to / list presented to : Patient  Discharge Placement                Patient chooses bed at: [facility] Patient to be transferred to facility by: [facility] Name of family member notified: [name] Patient and family notified of of transfer: 06/19/23  Discharge Plan and Services Additional resources added to the After Visit Summary for       Post Acute Care Choice: Skilled Nursing Facility                               Social Determinants of Health (SDOH) Interventions SDOH Screenings   Food Insecurity: No Food Insecurity (06/04/2023)  Housing: Low Risk  (06/04/2023)  Transportation Needs: No Transportation Needs (06/04/2023)  Utilities: Not At Risk (06/04/2023)  Financial Resource Strain: Low Risk  (08/13/2018)   Received from [facility] visits prior to 12/21/2022., [facility] visits prior to 12/21/2022.  Tobacco Use: Low Risk  (06/08/2023)     Readmission Risk Interventions    05/07/2023    4:42 PM 10/24/2022   11:21 AM 09/02/2022   10:36 AM  Readmission Risk Prevention Plan  Transportation Screening Complete Complete Complete  Medication Review [employer])  Complete Complete  PCP or Specialist appointment within 3-5 days of discharge Complete Complete Complete  HRI or Home Care Consult Complete Complete Complete  SW Recovery Care/Counseling Consult Complete Complete Complete  Palliative Care Screening Not  Applicable Not Applicable Not Applicable  Skilled Nursing Facility Complete Complete Complete

## 2023-06-27 ENCOUNTER — Other Ambulatory Visit: Payer: Self-pay

## 2023-06-27 ENCOUNTER — Ambulatory Visit (INDEPENDENT_AMBULATORY_CARE_PROVIDER_SITE_OTHER): Payer: Medicaid Other | Admitting: Internal Medicine

## 2023-06-27 VITALS — BP 120/85 | HR 88 | Temp 97.4°F

## 2023-06-27 DIAGNOSIS — N493 Fournier gangrene: Secondary | ICD-10-CM

## 2023-06-27 NOTE — Progress Notes (Signed)
RFV: follow up for fournier's gangrene  Patient ID: Peter Yang, male   DOB: 1956/07/21, 67 y.o.   MRN: [id number]  HPI Peter Yang is a 67yo M with recent hospitalization for fourniers gangrene, with multiple surgeries plus was discharged on Ertapenem 1 gm IV daily and nd Daptomycin 800 mg IV q24h for 6 weeks from last OR through 7/30. Now off of abtx x 1 week.he still remains dependent on Foley catheter and right arm PICC line, urology still needing to see him back. He was brought in by paramedics from SNF due to being bedbound  06/27/23 id clinic f/u Patient was readmitted 06/04/23 for worsening purulence off scrotal area His cx grew mdr pseudomonas and he was switched to ceftaz-avibactam on 06/07/23  He underwent I&D by urology as well on 8/18. There was a urethral-wound fistula noted and a suprapubic catheter was placed to divert urine  Pseudomonas S zerbaxa, avicaz Picc placed 8/22  Patient discharged on 3 weeks of avicaz to finish 06/28/23   Patient still has pain in his scrotal area No f/c No diarrhea/n/v He has seen urology in follow up   Outpatient Encounter Medications as of 06/27/2023  Medication Sig   acetaminophen (TYLENOL) 500 MG tablet Take 2 tablets (1,000 mg total) by mouth every 8 (eight) hours.   albuterol (PROVENTIL) (2.5 MG/3ML) 0.083% nebulizer solution Take 2.5 mg by nebulization every 4 (four) hours as needed for wheezing or shortness of breath.   albuterol (VENTOLIN HFA) 108 (90 Base) MCG/ACT inhaler Inhale 2 puffs into the lungs every 4 (four) hours as needed for wheezing or shortness of breath.   Amino Acids-Protein Hydrolys (FEEDING SUPPLEMENT, PRO-STAT SUGAR FREE 64,) LIQD Take 30 mLs by mouth daily.   apixaban (ELIQUIS) 5 MG TABS tablet Take 1 tablet (5 mg total) by mouth 2 (two) times daily.   atorvastatin (LIPITOR) 80 MG tablet Take 0.5 tablets (40 mg total) by mouth every evening.   B Complex-C (B COMPLEX-VITAMIN C PO) Take 1 tablet by mouth daily. B  complex/Vitamin C/Folic acid   bisacodyl (DULCOLAX) 10 MG suppository Place 1 suppository (10 mg total) rectally daily as needed for moderate constipation. (Patient taking differently: Place 10 mg rectally daily as needed (constipation).)   ceftazidime-avibactam 2.5 g in sodium chloride 0.9 % 50 mL Inject 2.5 g into the vein every 8 (eight) hours for 16 days. Indication:  MDR PsA + Kleb PNA Fournier's gangrene Last Day of Therapy:  06/28/23 Labs - Once weekly:  CBC/D and BMP, Labs - Once weekly: ESR and CRP Method of administration: IVPB - administer each dose over 2 hours Please leave PIC in place until doctor has seen patient or been notified   diclofenac Sodium (VOLTAREN) 1 % GEL Apply 2 g topically See admin instructions. "Apply to bilateral knees topically once time a day for pain. Apply 2 g to affected area 3 times daily"   docusate sodium (COLACE) 100 MG capsule Take 1 capsule (100 mg total) by mouth 2 (two) times daily.   escitalopram (LEXAPRO) 10 MG tablet Take 10 mg by mouth daily.   feeding supplement, GLUCERNA SHAKE, (GLUCERNA SHAKE) LIQD Take 237 mLs by mouth 3 (three) times daily between meals.   ferrous sulfate 325 (65 FE) MG tablet Take 1 tablet (325 mg total) by mouth daily with breakfast.   finasteride (PROSCAR) 5 MG tablet Take 5 mg by mouth daily.   folic acid (FOLVITE) 1 MG tablet Take 1 tablet (1 mg total) by mouth daily.  furosemide (LASIX) 40 MG tablet Take 1 tablet (40 mg total) by mouth daily.   gabapentin (NEURONTIN) 300 MG capsule Take 1 capsule (300 mg total) by mouth at bedtime.   HUMALOG KWIKPEN 100 UNIT/ML KwikPen Inject 0-15 Units into the skin See admin instructions. Inject 0-15 units three times daily before meals per sliding scale: BS < 60 call MD 0 - 100 : 0 units 101-150 : 3 units 151-200 : 4 units 201-250 : 7 units 251-300 : 9 units 301-350 : 12 units 351-400 : 15 units  BS > 400 call MD   hydrocortisone (CORTEF) 5 MG tablet Take 1 tablet (5 mg total)  by mouth 2 (two) times daily.   insulin glargine-yfgn (SEMGLEE) 100 UNIT/ML injection Inject 0.2 mLs (20 Units total) into the skin at bedtime.   leptospermum manuka honey (MEDIHONEY) PSTE paste Apply 1 Application topically daily.   melatonin 5 MG TABS Take 1 tablet (5 mg total) by mouth daily at 6 PM. (Patient taking differently: Take 5 mg by mouth at bedtime.)   metroNIDAZOLE (FLAGYL) 500 MG tablet Take 1 tablet (500 mg total) by mouth 2 (two) times daily for 15 days.   midodrine (PROAMATINE) 10 MG tablet Take 1 tablet (10 mg total) by mouth with breakfast, with lunch, and with evening meal.   Multiple Vitamin (MULTIVITAMIN WITH MINERALS) TABS tablet Take 1 tablet by mouth daily.   nystatin (MYCOSTATIN/NYSTOP) powder Apply topically 3 (three) times daily. (Patient taking differently: Apply 1 Application topically 2 (two) times daily.)   oxycodone (OXY-IR) 5 MG capsule Take 1 capsule (5 mg total) by mouth every 4 (four) hours as needed (moderate to severe pain).   oxyCODONE (OXYCONTIN) 15 mg 12 hr tablet Take 1 tablet (15 mg total) by mouth every 12 (twelve) hours.   pantoprazole (PROTONIX) 40 MG tablet Take 1 tablet (40 mg total) by mouth daily.   saccharomyces boulardii (FLORASTOR) 250 MG capsule Take 250 mg by mouth 2 (two) times daily.   senna-docusate (SENOKOT-S) 8.6-50 MG tablet Take 1 tablet by mouth 2 (two) times daily.   tamsulosin (FLOMAX) 0.4 MG CAPS capsule Take 0.4 mg by mouth daily.   traZODone (DESYREL) 50 MG tablet Take 50 mg by mouth at bedtime.   vitamin A 3 MG (10000 UNITS) capsule Take 1 capsule (10,000 Units total) by mouth daily.   zinc sulfate 220 (50 Zn) MG capsule Take 1 capsule (220 mg total) by mouth daily.   No facility-administered encounter medications on file as of 06/27/2023.     Patient Active Problem List   Diagnosis Date Noted   History of MDR Pseudomonas aeruginosa infection 06/07/2023   Malnutrition of moderate degree 06/06/2023   Wound dehiscence in  puerperium, perineal 06/04/2023   AMS (altered mental status) 05/07/2023   Seizure-like activity (HCC) 05/06/2023   Altered mental status 05/06/2023   Fournier's gangrene in male 04/02/2023   Soft tissue infection 04/02/2023   Necrotizing soft tissue infection 04/02/2023   UTI (urinary tract infection) due to urinary indwelling catheter (HCC) 04/01/2023   S/P BKA (below knee amputation) unilateral, left (HCC) 04/01/2023   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) 04/01/2023   Long term current use of systemic steroids 04/01/2023   Thrombocytopenia (HCC) 10/01/2022   Orthostatic hypotension 09/24/2022   HFrEF (heart failure with reduced ejection fraction) (HCC) 09/23/2022   Counseling and coordination of care 09/15/2022   Pain 09/14/2022   High risk medication use 09/14/2022   Need  for emotional support 09/12/2022   Mood altered 09/11/2022   Medication management 09/11/2022   Palliative care by specialist 09/08/2022   Goals of care, counseling/discussion 09/08/2022   Physical deconditioning 09/05/2022   Gross hematuria 08/31/2022   Type 2 diabetes mellitus with diabetic peripheral angiopathy without gangrene, without long-term current use of insulin (HCC)    Impaired functional mobility, balance, gait, and endurance 08/03/2022   Obstructive sleep apnea syndrome 08/03/2022   Pleural effusion, bilateral 08/03/2022   Catheter-associated urinary tract infection (HCC) 08/03/2022   History of urinary retention 08/03/2022   Mitral valve regurgitation 08/03/2022   Thoracic ascending aortic aneurysm (HCC) 07/23/2022   Borderline low blood pressure determined by examination 07/04/2022   Coronary artery disease due to lipid rich plaque 06/25/2022   Hematuria 06/25/2022   History of non-ST elevation myocardial infarction (NSTEMI) 06/15/2022   PAF (paroxysmal atrial fibrillation) (HCC) 06/15/2022   Dyslipidemia 06/15/2022   PVD (peripheral vascular disease) (HCC)  06/15/2022   Pressure ulcer of sacral region, stage 2 (HCC) 06/15/2022   Chronic HFrEF (heart failure with reduced ejection fraction) (HCC) 06/15/2022   DNR (do not resuscitate) 06/15/2022   Ischemic cardiomyopathy 02/12/2022   Need for assistance with personal care 08/03/2021   Diabetic polyneuropathy associated with type 2 diabetes mellitus (HCC) 07/28/2021   Prolonged QT interval 07/28/2021   Moderate protein-calorie malnutrition (HCC) 06/29/2021   Chronic insomnia 03/31/2021   BPH with obstruction/lower urinary tract symptoms 02/02/2018   Hyperlipidemia 04/15/2016   Vitamin D deficiency 04/09/2016   Essential hypertension 01/25/2016     Health Maintenance Due  Topic Date Due   Pneumonia Vaccine 50+ Years old (1 of 2 - PCV) Never done   FOOT EXAM  Never done   OPHTHALMOLOGY EXAM  Never done   Hepatitis C Screening  Never done   DTaP/Tdap/Td (1 - Tdap) Never done   Colonoscopy  Never done   Zoster Vaccines- Shingrix (2 of 2) 07/27/2020   INFLUENZA VACCINE  05/22/2023   COVID-19 Vaccine (4 - 2023-24 season) 06/22/2023     Review of Systems 12 point ros is negative except what is mentioned above Physical Exam   BP 120/85   Pulse 88   Temp (!) 97.4 F (36.3 C) (Temporal)   SpO2 93%  Patient in stretcher Normal respiratory effort Edema in llle; s/p left bka Right foot dressing c/d  Scrotal slightly erythematous; swelling decreased since hospitalization but still present; spc in place; packing bilateral scrotal area; tender to touch scrotal area   No purulence    06/27/23     Rue Picc site no erythema/purulence  Lab Results  Component Value Date   LABRPR NON REACTIVE 07/28/2021    CBC Lab Results  Component Value Date   WBC 5.9 06/19/2023   RBC 3.33 (L) 06/19/2023   HGB 8.8 (L) 06/19/2023   HCT 28.8 (L) 06/19/2023   PLT 145 (L) 06/19/2023   MCV 86.5 06/19/2023   MCH 26.4 06/19/2023   MCHC 30.6 06/19/2023   RDW 22.0 (H) 06/19/2023   LYMPHSABS 1.2  06/18/2023   MONOABS 0.7 06/18/2023   EOSABS 0.5 06/18/2023    BMET Lab Results  Component Value Date   NA 131 (L) 06/19/2023   K 3.8 06/19/2023   CL 95 (L) 06/19/2023   CO2 27 06/19/2023   GLUCOSE 197 (H) 06/19/2023   BUN 27 (H) 06/19/2023   CREATININE 0.81 06/19/2023   CALCIUM 8.3 (L) 06/19/2023   GFRNONAA >60 06/19/2023   Lab Results  Component Value Date   ESRSEDRATE 4 06/04/2023     Assessment and Plan   Abx: 8/17-c avycaz   67 year old male with history of forearm secondary status post I&D on 6/12, 03/1919 24 status post ertapenem/Dapto x 6 weeks EOT 7/30 admitted with 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  Cefepime was switched to Zyprexa-patient underwent bilateral scrotal aspiration and cystoscopy on 8/18 with urology - Noted urethral fistula, consulted IR for suprapubic to divert urine from the urethra - Pseudomonas sensitivities returned sensitive to Zerbaxa and Avycaz.  Patient is transition to [place] to preserve Zerbaxa in the future.  Cefideracol testing was also sent but somehow it wasn't done  ------- 06/27/23 id clinic assessment Swollen and tender but improved since discharged. Wound packing showed no sign purulence  Will continue avycaz another week till 9/13 F/u 2 weeks Patient has urology f/u 10/17 per discharge summary - will forward note to dr Marlou Porch         Raymondo Band, MD [facility] [phone]  pager   [phone] cell 06/27/2023, 11:33 AM

## 2023-06-27 NOTE — Patient Instructions (Signed)
See [place] in 2 weeks  Continue avycaz until 9/13  See dr Marlou Porch (urology) on 08/07/23

## 2023-06-29 ENCOUNTER — Emergency Department (HOSPITAL_COMMUNITY)
Admission: EM | Admit: 2023-06-29 | Discharge: 2023-06-30 | Disposition: A | Discharge status: Home / Self Care | Payer: Medicare Other | Attending: Emergency Medicine | Admitting: Emergency Medicine

## 2023-06-29 ENCOUNTER — Other Ambulatory Visit: Payer: Self-pay

## 2023-06-29 ENCOUNTER — Encounter (HOSPITAL_COMMUNITY): Payer: Self-pay

## 2023-06-29 DIAGNOSIS — E119 Type 2 diabetes mellitus without complications: Secondary | ICD-10-CM | POA: Insufficient documentation

## 2023-06-29 DIAGNOSIS — Z872 Personal history of diseases of the skin and subcutaneous tissue: Secondary | ICD-10-CM | POA: Insufficient documentation

## 2023-06-29 DIAGNOSIS — I5040 Unspecified combined systolic (congestive) and diastolic (congestive) heart failure: Secondary | ICD-10-CM | POA: Insufficient documentation

## 2023-06-29 DIAGNOSIS — R319 Hematuria, unspecified: Secondary | ICD-10-CM | POA: Diagnosis present

## 2023-06-29 DIAGNOSIS — Z8673 Personal history of transient ischemic attack (TIA), and cerebral infarction without residual deficits: Secondary | ICD-10-CM | POA: Diagnosis not present

## 2023-06-29 DIAGNOSIS — Z794 Long term (current) use of insulin: Secondary | ICD-10-CM | POA: Insufficient documentation

## 2023-06-29 DIAGNOSIS — Z7901 Long term (current) use of anticoagulants: Secondary | ICD-10-CM | POA: Insufficient documentation

## 2023-06-29 DIAGNOSIS — I2510 Atherosclerotic heart disease of native coronary artery without angina pectoris: Secondary | ICD-10-CM | POA: Diagnosis not present

## 2023-06-29 DIAGNOSIS — Z87438 Personal history of other diseases of male genital organs: Secondary | ICD-10-CM

## 2023-06-29 LAB — CBC WITH DIFFERENTIAL/PLATELET
Abs Immature Granulocytes: 0.02 10*3/uL (ref 0.00–0.07)
Basophils Absolute: 0.1 10*3/uL (ref 0.0–0.1)
Basophils Relative: 1 %
Eosinophils Absolute: 0.7 10*3/uL — ABNORMAL HIGH (ref 0.0–0.5)
Eosinophils Relative: 12 %
HCT: 28.8 % — ABNORMAL LOW (ref 39.0–52.0)
Hemoglobin: 8.8 g/dL — ABNORMAL LOW (ref 13.0–17.0)
Immature Granulocytes: 0 %
Lymphocytes Relative: 20 %
Lymphs Abs: 1.1 10*3/uL (ref 0.7–4.0)
MCH: 26.2 pg (ref 26.0–34.0)
MCHC: 30.6 g/dL (ref 30.0–36.0)
MCV: 85.7 fL (ref 80.0–100.0)
Monocytes Absolute: 0.7 10*3/uL (ref 0.1–1.0)
Monocytes Relative: 13 %
Neutro Abs: 3.1 10*3/uL (ref 1.7–7.7)
Neutrophils Relative %: 54 %
Platelets: 144 10*3/uL — ABNORMAL LOW (ref 150–400)
RBC: 3.36 MIL/uL — ABNORMAL LOW (ref 4.22–5.81)
RDW: 19.1 % — ABNORMAL HIGH (ref 11.5–15.5)
WBC: 5.7 10*3/uL (ref 4.0–10.5)
nRBC: 0 % (ref 0.0–0.2)

## 2023-06-29 LAB — BASIC METABOLIC PANEL
Anion gap: 8 (ref 5–15)
BUN: 18 mg/dL (ref 8–23)
CO2: 22 mmol/L (ref 22–32)
Calcium: 7.3 mg/dL — ABNORMAL LOW (ref 8.9–10.3)
Chloride: 104 mmol/L (ref 98–111)
Creatinine, Ser: 0.81 mg/dL (ref 0.61–1.24)
GFR, Estimated: >60 mL/min (ref >60)
Glucose, Bld: 144 mg/dL — ABNORMAL HIGH (ref 70–99)
Potassium: 3 mmol/L — ABNORMAL LOW (ref 3.5–5.1)
Sodium: 134 mmol/L — ABNORMAL LOW (ref 135–145)

## 2023-06-29 MED ORDER — POTASSIUM CHLORIDE CRYS ER 20 MEQ PO TBCR
40.0000 meq | EXTENDED_RELEASE_TABLET | Freq: Once | ORAL | Status: AC
  Administered 2023-06-29: 40 meq via ORAL
  Filled 2023-06-29: qty 2

## 2023-06-29 MED ORDER — DEXTROSE 5 % IV SOLN
2.5000 g | Freq: Three times a day (TID) | INTRAVENOUS | Status: DC
  Administered 2023-06-29: 2.5 g via INTRAVENOUS
  Filled 2023-06-29 (×3): qty 12

## 2023-06-29 MED ORDER — METRONIDAZOLE 500 MG PO TABS
500.0000 mg | ORAL_TABLET | Freq: Two times a day (BID) | ORAL | Status: DC
  Administered 2023-06-29: 500 mg via ORAL
  Filled 2023-06-29: qty 1

## 2023-06-29 NOTE — ED Provider Notes (Signed)
[place] [facility] AT [facility] Provider Note   CSN: [id number] Arrival date & time: 06/29/23  1836     History  Chief Complaint  Patient presents with   IV antibiotic    Peter Yang is a 67 y.o. male.  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  Was last seen by ID in clinic on 06/27/2023. Per clinic notes, the patient's IV ABX was scheduled to be finished as of 9/7. He denies any pain or complaints on arrival. I called [facility] and spoke with the nurse on-call who states that the patient was sent to the ED because they ran out of his antibiotic.   Per ID clinic notes: 06/27/23 id clinic assessment Swollen and tender but improved since discharged. Wound packing showed no sign purulence   Will continue avycaz another week till 9/13 F/u 2 weeks Patient has urology f/u 10/17 per discharge summary - will forward note to dr Marlou Porch  Home Medications Prior to Admission medications   Medication Sig Start Date End Date Taking? Authorizing Provider  acetaminophen (TYLENOL) 500 MG tablet Take 2 tablets (1,000 mg total) by mouth every 8 (eight) hours. 04/18/23   Joseph Art, DO  albuterol (PROVENTIL) (2.5 MG/3ML) 0.083% nebulizer solution Take 2.5 mg by nebulization every 4 (four) hours as needed for wheezing or shortness of breath.    [provider]  albuterol (VENTOLIN HFA) 108 (90 Base) MCG/ACT inhaler Inhale 2 puffs into the lungs every 4 (four) hours as needed for wheezing or shortness  of breath.    [provider]  Amino Acids-Protein Hydrolys (FEEDING SUPPLEMENT, PRO-STAT SUGAR FREE 64,) LIQD Take 30 mLs by mouth daily.    [provider]  apixaban (ELIQUIS) 5 MG TABS tablet Take 1 tablet (5 mg total) by mouth 2 (two) times daily. 06/23/23 07/23/23  Leatha Gilding, MD  atorvastatin (LIPITOR) 80 MG tablet Take 0.5 tablets (40 mg total) by mouth every evening. 04/18/23   Joseph Art, DO  B Complex-C (B COMPLEX-VITAMIN C PO) Take 1 tablet by mouth daily. B complex/Vitamin C/Folic acid    [provider]  bisacodyl (DULCOLAX) 10 MG suppository Place 1 suppository (10 mg total) rectally daily as needed for moderate constipation. Patient taking differently: Place 10 mg rectally daily as needed (constipation). 10/24/22   Lanae Boast, MD  ceftazidime-avibactam 2.5 g in sodium chloride 0.9 % 50 mL Inject 2.5 g into the vein every 8 (eight) hours for 16 days. Indication:  MDR PsA + Kleb PNA Fournier's gangrene Last Day of Therapy:  06/28/23 Labs - Once weekly:  CBC/D and BMP, Labs - Once weekly: ESR and CRP Method of administration: IVPB - administer each dose over 2 hours Please leave PIC in place until doctor has seen patient or been notified 06/19/23 07/05/23  Leatha Gilding, MD  diclofenac Sodium (VOLTAREN) 1 % GEL Apply 2 g topically See admin instructions. "Apply to bilateral knees topically once time a day for pain. Apply 2 g to affected area 3 times daily" 07/20/21   [provider]  docusate sodium (COLACE) 100 MG capsule Take 1 capsule (100 mg total) by mouth 2 (two) times daily. 04/18/23   Joseph Art, DO  escitalopram (LEXAPRO) 10 MG tablet Take 10 mg by mouth daily.    [provider]  feeding supplement, GLUCERNA SHAKE, (GLUCERNA SHAKE) LIQD Take 237 mLs by mouth 3 (three) times daily between meals. 06/19/23   Leatha Gilding, MD  ferrous sulfate 325 (65 FE) MG tablet Take 1 tablet (325 mg total) by mouth daily with breakfast.  10/25/22   Lanae Boast, MD  finasteride (PROSCAR) 5 MG tablet Take 5 mg by mouth daily.    [provider]  folic acid (FOLVITE) 1 MG tablet Take 1 tablet (1 mg total) by mouth daily. 05/14/23   Leroy Sea, MD  furosemide (LASIX) 40 MG tablet Take 1 tablet (40 mg total) by mouth daily. 06/19/23   Leatha Gilding, MD  gabapentin (NEURONTIN) 300 MG capsule Take 1 capsule (300 mg total) by mouth at bedtime. 04/18/23   Joseph Art, DO  HUMALOG KWIKPEN 100 UNIT/ML KwikPen Inject 0-15 Units into the skin See admin instructions. Inject 0-15 units three times daily before meals per sliding scale: BS < 60 call MD 0 - 100 : 0 units 101-150 : 3 units 151-200 : 4 units 201-250 : 7 units 251-300 : 9 units 301-350 : 12 units 351-400 : 15 units  BS > 400 call MD 04/22/23   [provider]  hydrocortisone (CORTEF) 5 MG tablet Take 1 tablet (5 mg total) by mouth 2 (two) times daily. 04/18/23   Joseph Art, DO  insulin glargine-yfgn (SEMGLEE) 100 UNIT/ML injection Inject 0.2 mLs (20 Units total) into the skin at bedtime. 04/18/23   Joseph Art, DO  leptospermum manuka honey (MEDIHONEY) PSTE paste Apply 1 Application topically daily. 06/20/23   Gherghe, Daylene Katayama, MD  melatonin 5 MG TABS Take 1 tablet (5 mg total) by mouth daily at 6 PM. Patient taking differently: Take 5 mg by mouth at bedtime. 10/24/22   Lanae Boast, MD  metroNIDAZOLE (FLAGYL) 500 MG tablet Take 1 tablet (500 mg total) by mouth 2 (two) times daily for 15 days. 06/19/23 07/04/23  Leatha Gilding, MD  midodrine (PROAMATINE) 10 MG tablet Take 1 tablet (10 mg total) by mouth with breakfast, with lunch, and with evening meal. 04/18/23   Joseph Art, DO  Multiple Vitamin (MULTIVITAMIN WITH MINERALS) TABS tablet Take 1 tablet by mouth daily. 06/20/23   Leatha Gilding, MD  nystatin (MYCOSTATIN/NYSTOP) powder Apply topically 3 (three) times daily. Patient taking differently: Apply 1 Application topically 2 (two) times  daily. 05/13/23   Leroy Sea, MD  oxycodone (OXY-IR) 5 MG capsule Take 1 capsule (5 mg total) by mouth every 4 (four) hours as needed (moderate to severe pain). 06/19/23   Leatha Gilding, MD  oxyCODONE (OXYCONTIN) 15 mg 12 hr tablet Take 1 tablet (15 mg total) by mouth every 12 (twelve) hours. 06/19/23   Leatha Gilding, MD  pantoprazole (PROTONIX) 40 MG tablet Take 1 tablet (40 mg total) by mouth daily. 06/30/22   Pokhrel, Rebekah Chesterfield, MD  saccharomyces boulardii (FLORASTOR) 250 MG capsule Take 250 mg by mouth 2 (two) times daily.    [provider]  senna-docusate (SENOKOT-S) 8.6-50 MG tablet Take 1 tablet by mouth 2 (two) times daily. 10/24/22   Lanae Boast, MD  tamsulosin (FLOMAX) 0.4 MG CAPS capsule Take 0.4 mg by mouth daily. 07/29/22  [provider]  traZODone (DESYREL) 50 MG tablet Take 50 mg by mouth at bedtime. 06/25/23   [provider]  vitamin A 3 MG (10000 UNITS) capsule Take 1 capsule (10,000 Units total) by mouth daily. 06/20/23   Leatha Gilding, MD  zinc sulfate 220 (50 Zn) MG capsule Take 1 capsule (220 mg total) by mouth daily. 06/20/23   Leatha Gilding, MD      Allergies    Sglt2 inhibitors    Review of Systems   Review of Systems  All other systems reviewed and are negative.   Physical Exam Updated Vital Signs BP 94/63   Pulse 79   Temp 98.2 F (36.8 C) (Oral)   Resp 18   Ht 6' 2.25" (1.886 m)   Wt 108.7 kg   SpO2 97%   BMI 30.56 kg/m  Physical Exam Vitals and nursing note reviewed.  Constitutional:      General: He is not in acute distress. HENT:     Head: Normocephalic and atraumatic.  Eyes:     Conjunctiva/sclera: Conjunctivae normal.     Pupils: Pupils are equal, round, and reactive to light.  Cardiovascular:     Rate and Rhythm: Normal rate and regular rhythm.  Pulmonary:     Effort: Pulmonary effort is normal. No respiratory distress.  Abdominal:     General: There is no distension.     Tenderness: There is no  guarding.  Genitourinary:    Comments: Suprapubic catheter in place, area of old Fournier's gangrene appears to be well-healing, pink tissue noted, nontender to palpation, no evidence of cellulitis. Musculoskeletal:        General: No deformity or signs of injury.     Cervical back: Neck supple.  Skin:    Coloration: Skin is not pale.     Findings: No lesion or rash.  Neurological:     General: No focal deficit present.     Mental Status: He is alert. Mental status is at baseline.     ED Results / Procedures / Treatments   Labs (all labs ordered are listed, but only abnormal results are displayed) Labs Reviewed  BASIC METABOLIC PANEL - Abnormal; Notable for the following components:      Result Value   Sodium 134 (*)    Potassium 3.0 (*)    Glucose, Bld 144 (*)    Calcium 7.3 (*)    All other components within normal limits  CBC WITH DIFFERENTIAL/PLATELET  CBC WITH DIFFERENTIAL/PLATELET    EKG None  Radiology No results found.  Procedures Procedures    Medications Ordered in ED Medications  metroNIDAZOLE (FLAGYL) tablet 500 mg (500 mg Oral Given 06/29/23 2102)  ceftazidime-avibactam (AVYCAZ) 2.5 g in dextrose 5 % 50 mL IVPB (2.5 g Intravenous New Bag/Given 06/29/23 2100)    ED Course/ Medical Decision Making/ A&P                                 Medical Decision Making   67 year old male with medical history significant for CAD, diabetic polyneuropathy with diabetes mellitus type 2, history of NSTEMI s/p stent placement, hyperlipidemia, BPH, history of large bowel perforation, history of PAF, CVA, diabetic foot ulcer, history of BKA on the left, 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  Was last seen by ID in clinic on 06/27/2023. Per clinic notes, the patient's IV ABX was scheduled to be  finished as of 9/7. He denies any pain or complaints on arrival. I called [facility] and spoke with the nurse on-call who states that the patient was sent to the ED because they ran out of his antibiotic.   Per ID clinic notes: 06/27/23 id clinic assessment Swollen and tender but improved since discharged. Wound packing showed no sign purulence   Will continue avycaz another week till 9/13 F/u 2 weeks Patient has urology f/u 10/17 per discharge summary - will forward note to dr Marlou Porch  Of note, patient who has history of Fournier's gangrene and underwent debridement of a scrotal abscess on 8/14 with urology, cultures have been positive for Klebsiella pneumonia and multidrug-resistant Pseudomonas.  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  On arrival, the patient was vitally stable.  Genitourinary area appears to be well-healing.  Per ID, patient antibiotics extended to another week through 9/13.  His facility ran out of his antibiotics.  Discussed with pharmacy and a single dose of his antibiotic was ordered.  His Village states that after a dose of antibiotics he can go back to his [name] facility later tonight as they should have antibiotics available tomorrow.  Signout given to Dr. Jearld Fenton with a plan for discharge back to [name] pending antibiotic administration.  Final Clinical Impression(s) / ED Diagnoses Final diagnoses:  History of Fournier's gangrene    Rx / DC Orders ED Discharge Orders     None         Ernie Avena, MD 06/29/23 2139

## 2023-06-29 NOTE — ED Provider Notes (Signed)
11:40 PM Assumed care of patient from off-going team. For more details, please see note from same day.  In brief, this is a 67 y.o. male sent in by [facility] because they ran out of his antibiotic for fournier's gangrene. Infection site healing well. Patient has no complaints. ID note from 9/6 stated that antibiotics are scheduled to end 9/7. Suprapubic catheter draining appropriately. ID had wanted to extend his abx treatment until 9/13. Dr. Karene Fry discussed with facility who stated they will have antibiotics available tomorrow after his dose tonight.  Plan/Dispo at time of sign-out & ED Course since sign-out: [ ]  screening labs  BP 96/66   Pulse 78   Temp 98.2 F (36.8 C) (Oral)   Resp 18   Ht 6' 2.25" (1.886 m)   Wt 108.7 kg   SpO2 95%   BMI 30.56 kg/m    ED Course:   Clinical Course as of 06/29/23 2340  Sun Jun 29, 2023  2339 Pt w/ very mild hypokalemia repleted orally. CBC had not been sent to lab, which is sent now. Patient receiving abx infusion. Will be signed out to Dr. Bebe Shaggy pending CBC, then can likely be discharged. [HN]    Clinical Course User Index [HN] Loetta Rough, MD    Dispo: Signed out pending CBC ------------------------------- Vivi Barrack, MD Emergency Medicine  This note was created using dictation software, which may contain spelling or grammatical errors.   Loetta Rough, MD 06/29/23 [phone]

## 2023-06-29 NOTE — ED Triage Notes (Signed)
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 Clots and hematuria are noted in his drainage brag.

## 2023-06-29 NOTE — ED Notes (Signed)
Messaged Pharmacy for medication.

## 2023-06-30 LAB — MISC LABCORP TEST (SEND OUT)
LabCorp test name: 60201
Labcorp test code: 9985

## 2023-06-30 NOTE — ED Notes (Signed)
Placed call to [facility] and spoke with [facility] LPN his nurse to alert her about his return

## 2023-06-30 NOTE — ED Notes (Signed)
Ptar called 

## 2023-06-30 NOTE — Discharge Instructions (Addendum)
Please continue your antibiotics until 9/13. Please follow up with your primary doctor within 1-2 weeks for reevaluation and lab recheck. Please return to the ED for any worsening symptoms, fever/chills, or concerns.

## 2023-07-09 ENCOUNTER — Ambulatory Visit (INDEPENDENT_AMBULATORY_CARE_PROVIDER_SITE_OTHER): Payer: Medicaid Other | Admitting: Internal Medicine

## 2023-07-09 ENCOUNTER — Encounter: Payer: Self-pay | Admitting: Internal Medicine

## 2023-07-09 ENCOUNTER — Telehealth: Payer: Self-pay

## 2023-07-09 ENCOUNTER — Other Ambulatory Visit: Payer: Self-pay

## 2023-07-09 VITALS — BP 96/67 | HR 91 | Resp 16 | Ht 74.25 in | Wt 239.4 lb

## 2023-07-09 DIAGNOSIS — N493 Fournier gangrene: Secondary | ICD-10-CM | POA: Diagnosis present

## 2023-07-09 NOTE — Telephone Encounter (Signed)
Spoketo Jessica at facility by calling [phone] and relayed order to Pull PICC ASAP. Also to follow up with Urology 08-07-23 and the with [facility] after. Notified [facility] of picc pull for OPAT tracking.

## 2023-07-09 NOTE — Progress Notes (Signed)
Patient: Peter Yang  DOB: 03-Sep-1956 MRN: [id number] PCP: Patient, No Pcp Per      Patient Active Problem List   Diagnosis Date Noted   History of MDR Pseudomonas aeruginosa infection 06/07/2023   Malnutrition of moderate degree 06/06/2023   Wound dehiscence in puerperium, perineal 06/04/2023   AMS (altered mental status) 05/07/2023   Seizure-like activity (HCC) 05/06/2023   Altered mental status 05/06/2023   Fournier's gangrene in male 04/02/2023   Soft tissue infection 04/02/2023   Necrotizing soft tissue infection 04/02/2023   UTI (urinary tract infection) due to urinary indwelling catheter (HCC) 04/01/2023   S/P BKA (below knee amputation) unilateral, left (HCC) 04/01/2023   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) 04/01/2023   Long term current use of systemic steroids 04/01/2023   Thrombocytopenia (HCC) 10/01/2022   Orthostatic hypotension 09/24/2022   HFrEF (heart failure with reduced ejection fraction) (HCC) 09/23/2022   Counseling and coordination of care 09/15/2022   Pain 09/14/2022   High risk medication use 09/14/2022   Need for emotional support 09/12/2022   Mood altered 09/11/2022   Medication management 09/11/2022   Palliative care by specialist 09/08/2022   Goals of care, counseling/discussion 09/08/2022   Physical deconditioning 09/05/2022   Gross hematuria 08/31/2022   Type 2 diabetes mellitus with diabetic peripheral angiopathy without gangrene, without long-term current use of insulin (HCC)    Impaired functional mobility, balance, gait, and endurance 08/03/2022   Obstructive sleep apnea syndrome 08/03/2022   Pleural effusion, bilateral 08/03/2022   Catheter-associated urinary tract infection (HCC) 08/03/2022   History of urinary retention 08/03/2022   Mitral valve regurgitation 08/03/2022   Thoracic ascending aortic aneurysm (HCC) 07/23/2022   Borderline low blood pressure determined by examination 07/04/2022    Coronary artery disease due to lipid rich plaque 06/25/2022   Hematuria 06/25/2022   History of non-ST elevation myocardial infarction (NSTEMI) 06/15/2022   PAF (paroxysmal atrial fibrillation) (HCC) 06/15/2022   Dyslipidemia 06/15/2022   PVD (peripheral vascular disease) (HCC) 06/15/2022   Pressure ulcer of sacral region, stage 2 (HCC) 06/15/2022   Chronic HFrEF (heart failure with reduced ejection fraction) (HCC) 06/15/2022   DNR (do not resuscitate) 06/15/2022   Ischemic cardiomyopathy 02/12/2022   Need for assistance with personal care 08/03/2021   Diabetic polyneuropathy associated with type 2 diabetes mellitus (HCC) 07/28/2021   Prolonged QT interval 07/28/2021   Moderate protein-calorie malnutrition (HCC) 06/29/2021   Chronic insomnia 03/31/2021   BPH with obstruction/lower urinary tract symptoms 02/02/2018   Hyperlipidemia 04/15/2016   Vitamin D deficiency 04/09/2016   Essential hypertension 01/25/2016     Subjective:  Peter Yang is a 67 y.o. M with past medical history as below including istory of forearm secondary status post I&D on 6/12, 03/1919 24 status post ertapenem/Dapto x 6 weeks EOT 7/30 presents for hospital follow-up of persistent wound from Fournier's gangrene site.  He underwent debridement of scrotal abscess on 8/14 with urology.  Cultures grew Klebsiella pneumonia and MDR Pseudomonas aeruginosa.  Noted urethral fistula, consulted IR for suprapubic diverting urine from urethra.  Discharged on Avycaz x 3 weeks from OR 8/18 EOT 9/7. - Seen by Dr. Dorna Bloom on 9/6 who extended antibiotics.  Noted wound had improved, although swollen and tender. Review of Systems  All other systems reviewed and are negative.   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 Overview:  Added automatically from request for surgery [id number] Formatting of this note might be different from the original. Added automatically from request for surgery [id number]   Large bowel perforation (HCC) 09/27/2015   Malignant neoplasm of prostate (HCC) 10/22/2019   Myopia with astigmatism and presbyopia, bilateral 06/15/2018   Last Assessment & Plan:  Formatting of this note might be different from the original. - Wrx printed Formatting of this note might be different from the original. 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 - Stable, monitor Formatting of this note might be different from the original. Last Assessment & Plan:  - Stable, monitor   Retroperitoneal abscess (HCC) 10/02/2015   Status post corneal transplant 10/07/2016   Formatting of this note might be different from the original. Overview:  Added automatically from request for surgery [id number] Formatting of this note might be different from the original. Added automatically from request for surgery [id number]  Last Assessment & Plan:  Formatting of this note might be  different from the original. - OD, 2/2 KCN - Stable, no NVK / rejection - Continue off steroids - Mo   Thoracic ascending aortic aneurysm (HCC)    TIA (transient ischemic attack) 2012   Type 2 diabetes mellitus (HCC)     Outpatient Medications Prior to Visit  Medication Sig Dispense Refill   acetaminophen (TYLENOL) 500 MG tablet Take 2 tablets (1,000 mg total) by mouth every 8 (eight) hours. 30 tablet 0   albuterol (PROVENTIL) (2.5 MG/3ML) 0.083% nebulizer solution Take 2.5 mg by nebulization every 4 (four) hours as needed for wheezing or shortness of breath.     albuterol (VENTOLIN HFA) 108 (90 Base) MCG/ACT inhaler Inhale 2 puffs into the lungs every 4 (four) hours as needed for wheezing or shortness of breath.     Amino Acids-Protein Hydrolys (FEEDING SUPPLEMENT, PRO-STAT SUGAR FREE 64,) LIQD Take 30 mLs by mouth daily.     apixaban (ELIQUIS) 5 MG TABS tablet Take 1 tablet (5 mg total) by mouth 2 (two) times daily.     atorvastatin (LIPITOR) 80 MG tablet Take 0.5 tablets (40 mg total) by mouth every evening. 30 tablet 0   B Complex-C (B COMPLEX-VITAMIN C PO) Take 1 tablet by mouth daily. B complex/Vitamin C/Folic acid     bisacodyl (DULCOLAX) 10 MG suppository Place 1 suppository (10 mg total) rectally daily as needed for moderate constipation. (Patient taking differently: Place 10 mg rectally daily as needed (constipation).) 12 suppository 0   diclofenac Sodium (VOLTAREN) 1 % GEL Apply 2  g topically See admin instructions. "Apply to bilateral knees topically once time a day for pain. Apply 2 g to affected area 3 times daily"     docusate sodium (COLACE) 100 MG capsule Take 1 capsule (100 mg total) by mouth 2 (two) times daily. 10 capsule 0   escitalopram (LEXAPRO) 10 MG tablet Take 10 mg by mouth daily.     feeding supplement, GLUCERNA SHAKE, (GLUCERNA SHAKE) LIQD Take 237 mLs by mouth 3 (three) times daily between meals.     ferrous sulfate 325 (65 FE) MG tablet Take 1 tablet (325 mg total)  by mouth daily with breakfast.  3   finasteride (PROSCAR) 5 MG tablet Take 5 mg by mouth daily.     folic acid (FOLVITE) 1 MG tablet Take 1 tablet (1 mg total) by mouth daily.     furosemide (LASIX) 40 MG tablet Take 1 tablet (40 mg total) by mouth daily.     gabapentin (NEURONTIN) 300 MG capsule Take 1 capsule (300 mg total) by mouth at bedtime.     HUMALOG KWIKPEN 100 UNIT/ML KwikPen Inject 0-15 Units into the skin See admin instructions. Inject 0-15 units three times daily before meals per sliding scale: BS < 60 call MD 0 - 100 : 0 units 101-150 : 3 units 151-200 : 4 units 201-250 : 7 units 251-300 : 9 units 301-350 : 12 units 351-400 : 15 units  BS > 400 call MD     hydrocortisone (CORTEF) 5 MG tablet Take 1 tablet (5 mg total) by mouth 2 (two) times daily.     insulin glargine-yfgn (SEMGLEE) 100 UNIT/ML injection Inject 0.2 mLs (20 Units total) into the skin at bedtime. 10 mL 11   leptospermum manuka honey (MEDIHONEY) PSTE paste Apply 1 Application topically daily.     melatonin 5 MG TABS Take 1 tablet (5 mg total) by mouth daily at 6 PM. (Patient taking differently: Take 5 mg by mouth at bedtime.)  0   midodrine (PROAMATINE) 10 MG tablet Take 1 tablet (10 mg total) by mouth with breakfast, with lunch, and with evening meal.     Multiple Vitamin (MULTIVITAMIN WITH MINERALS) TABS tablet Take 1 tablet by mouth daily.     nystatin (MYCOSTATIN/NYSTOP) powder Apply topically 3 (three) times daily. (Patient taking differently: Apply 1 Application topically 2 (two) times daily.) 15 g 0   oxycodone (OXY-IR) 5 MG capsule Take 1 capsule (5 mg total) by mouth every 4 (four) hours as needed (moderate to severe pain). 12 capsule 0   oxyCODONE (OXYCONTIN) 15 mg 12 hr tablet Take 1 tablet (15 mg total) by mouth every 12 (twelve) hours. 10 tablet 0   pantoprazole (PROTONIX) 40 MG tablet Take 1 tablet (40 mg total) by mouth daily. 30 tablet 2   saccharomyces boulardii (FLORASTOR) 250 MG capsule Take 250  mg by mouth 2 (two) times daily.     senna-docusate (SENOKOT-S) 8.6-50 MG tablet Take 1 tablet by mouth 2 (two) times daily.     tamsulosin (FLOMAX) 0.4 MG CAPS capsule Take 0.4 mg by mouth daily.     traZODone (DESYREL) 50 MG tablet Take 50 mg by mouth at bedtime.     vitamin A 3 MG (10000 UNITS) capsule Take 1 capsule (10,000 Units total) by mouth daily.     zinc sulfate 220 (50 Zn) MG capsule Take 1 capsule (220 mg total) by mouth daily.     No facility-administered medications prior to visit.     Allergies  Allergen Reactions   Sglt2 Inhibitors Other (See Comments)    Fournier's gangrene    Social History   Tobacco Use   Smoking status: Never   Smokeless tobacco: Never  Substance Use Topics   Alcohol use: Never   Drug use: Never    Family History  Problem Relation Age of Onset   Diabetes Mother    Hypertension Mother    Heart disease Mother    Cancer Father    Heart attack Sister    Cancer Sister    Diabetes Brother     Objective:   Vitals:   07/09/23 1029  Resp: 16  Weight: 239 lb 6.4 oz (108.6 kg)  Height: 6' 2.25" (1.886 m)   Body mass index is 30.53 kg/m.  Physical Exam Constitutional:      General: He is not in acute distress.    Appearance: He is normal weight. He is not toxic-appearing.  HENT:     Head: Normocephalic and atraumatic.     Right Ear: External ear normal.     Left Ear: External ear normal.     Nose: No congestion or rhinorrhea.     Mouth/Throat:     Mouth: Mucous membranes are moist.     Pharynx: Oropharynx is clear.  Eyes:     Extraocular Movements: Extraocular movements intact.     Conjunctiva/sclera: Conjunctivae normal.     Pupils: Pupils are equal, round, and reactive to light.  Cardiovascular:     Rate and Rhythm: Normal rate and regular rhythm.     Heart sounds: No murmur heard.    No friction rub. No gallop.  Pulmonary:     Effort: Pulmonary effort is normal.     Breath sounds: Normal breath sounds.  Abdominal:      General: Abdomen is flat. Bowel sounds are normal.     Palpations: Abdomen is soft.  Musculoskeletal:        General: No swelling. Normal range of motion.     Cervical back: Normal range of motion and neck supple.  Skin:    General: Skin is warm and dry.  Neurological:     General: No focal deficit present.     Mental Status: He is oriented to person, place, and time.  Psychiatric:        Mood and Affect: Mood normal.     Lab Results: Lab Results  Component Value Date   WBC 5.7 06/29/2023   HGB 8.8 (L) 06/29/2023   HCT 28.8 (L) 06/29/2023   MCV 85.7 06/29/2023   PLT 144 (L) 06/29/2023    Lab Results  Component Value Date   CREATININE 0.81 06/29/2023   BUN 18 06/29/2023   NA 134 (L) 06/29/2023   K 3.0 (L) 06/29/2023   CL 104 06/29/2023   CO2 22 06/29/2023    Lab Results  Component Value Date   ALT 15 06/19/2023   AST 20 06/19/2023   ALKPHOS 71 06/19/2023   BILITOT 0.7 06/19/2023     Assessment & Plan:   #History of foreigners gangrene with persistent wound drainage with MDR Pseudomonas # Status post CT-guided suprapubic catheter placement today 8/19 - Seen with Dr. Renold Don on 9/6, avycaz extended till 9/13 as wound swollen and tender but improved since discharge -Urology f/u on 1017 - Today, minimal tenderness noted.  No purulence noted on bandage side.  Wound appears to be healing well.  Labs stable as below.  He has received over 4 weeks antibiotics, given clinical improvement  and stable left: Okay to stop antibiotics. Plan -Pull PICC, stop abx -We did labs today -F/U with ID on 11/11 after Urology appt on 10/17    #Medication monitoring 06/29/23 wbc 5.7, Scr 0.81   Danelle Earthly, MD [facility]   07/09/23  10:30 AM   I have personally spent 47 minutes involved in face-to-face and non-face-to-face activities for this patient on the day of the visit. Professional time spent includes the following  activities: Preparing to see the patient (review of tests), Obtaining and/or reviewing separately obtained history (admission/discharge record), Performing a medically appropriate examination and/or evaluation , Ordering medications/tests/procedures, referring and communicating with other health care professionals, Documenting clinical information in the EMR, Independently interpreting results (not separately reported), Communicating results to the patient/family/caregiver, Counseling and educating the patient/family/caregiver and Care coordination (not separately reported).

## 2023-07-09 NOTE — Patient Instructions (Signed)
PULL PICC We did labs today F/U wit ID in one month after Urology appt on 10/17

## 2023-07-10 LAB — CBC WITH DIFFERENTIAL/PLATELET
Absolute Monocytes: 878 cells/uL (ref 200–950)
Basophils Absolute: 43 cells/uL (ref 0–200)
Basophils Relative: 0.7 %
Eosinophils Absolute: 506 cells/uL — ABNORMAL HIGH (ref 15–500)
Eosinophils Relative: 8.3 %
HCT: 35 % — ABNORMAL LOW (ref 38.5–50.0)
Hemoglobin: 10.7 g/dL — ABNORMAL LOW (ref 13.2–17.1)
Lymphs Abs: 1257 cells/uL (ref 850–3900)
MCH: 26.4 pg — ABNORMAL LOW (ref 27.0–33.0)
MCHC: 30.6 g/dL — ABNORMAL LOW (ref 32.0–36.0)
MCV: 86.4 fL (ref 80.0–100.0)
MPV: 11.6 fL (ref 7.5–12.5)
Monocytes Relative: 14.4 %
Neutro Abs: 3416 cells/uL (ref 1500–7800)
Neutrophils Relative %: 56 %
Platelets: 142 10*3/uL (ref 140–400)
RBC: 4.05 10*6/uL — ABNORMAL LOW (ref 4.20–5.80)
RDW: 15.6 % — ABNORMAL HIGH (ref 11.0–15.0)
Total Lymphocyte: 20.6 %
WBC: 6.1 10*3/uL (ref 3.8–10.8)

## 2023-07-10 LAB — COMPLETE METABOLIC PANEL WITH GFR
AG Ratio: 1.4 (calc) (ref 1.0–2.5)
ALT: 7 U/L — ABNORMAL LOW (ref 9–46)
AST: 15 U/L (ref 10–35)
Albumin: 3.3 g/dL — ABNORMAL LOW (ref 3.6–5.1)
Alkaline phosphatase (APISO): 69 U/L (ref 35–144)
BUN: 22 mg/dL (ref 7–25)
CO2: 23 mmol/L (ref 20–32)
Calcium: 8.5 mg/dL — ABNORMAL LOW (ref 8.6–10.3)
Chloride: 101 mmol/L (ref 98–110)
Creat: 0.92 mg/dL (ref 0.70–1.35)
Globulin: 2.4 g/dL (calc) (ref 1.9–3.7)
Glucose, Bld: 180 mg/dL — ABNORMAL HIGH (ref 65–99)
Potassium: 3.6 mmol/L (ref 3.5–5.3)
Sodium: 135 mmol/L (ref 135–146)
Total Bilirubin: 0.6 mg/dL (ref 0.2–1.2)
Total Protein: 5.7 g/dL — ABNORMAL LOW (ref 6.1–8.1)
eGFR: 92 mL/min/{1.73_m2} (ref >=60)

## 2023-07-28 ENCOUNTER — Emergency Department (HOSPITAL_COMMUNITY)
Admission: EM | Admit: 2023-07-28 | Discharge: 2023-07-28 | Disposition: A | Discharge status: Home / Self Care | Payer: Medicaid Other | Attending: Emergency Medicine | Admitting: Emergency Medicine

## 2023-07-28 ENCOUNTER — Other Ambulatory Visit: Payer: Self-pay

## 2023-07-28 ENCOUNTER — Encounter (HOSPITAL_COMMUNITY): Payer: Self-pay | Admitting: *Deleted

## 2023-07-28 DIAGNOSIS — T839XXA Unspecified complication of genitourinary prosthetic device, implant and graft, initial encounter: Secondary | ICD-10-CM

## 2023-07-28 DIAGNOSIS — Z79899 Other long term (current) drug therapy: Secondary | ICD-10-CM | POA: Insufficient documentation

## 2023-07-28 DIAGNOSIS — T83098A Other mechanical complication of other indwelling urethral catheter, initial encounter: Secondary | ICD-10-CM | POA: Insufficient documentation

## 2023-07-28 DIAGNOSIS — I2510 Atherosclerotic heart disease of native coronary artery without angina pectoris: Secondary | ICD-10-CM | POA: Diagnosis not present

## 2023-07-28 DIAGNOSIS — Z7984 Long term (current) use of oral hypoglycemic drugs: Secondary | ICD-10-CM | POA: Diagnosis not present

## 2023-07-28 DIAGNOSIS — Y69 Unspecified misadventure during surgical and medical care: Secondary | ICD-10-CM | POA: Diagnosis not present

## 2023-07-28 DIAGNOSIS — E119 Type 2 diabetes mellitus without complications: Secondary | ICD-10-CM | POA: Insufficient documentation

## 2023-07-28 DIAGNOSIS — Z794 Long term (current) use of insulin: Secondary | ICD-10-CM | POA: Insufficient documentation

## 2023-07-28 DIAGNOSIS — I10 Essential (primary) hypertension: Secondary | ICD-10-CM | POA: Insufficient documentation

## 2023-07-28 LAB — I-STAT CHEM 8, ED
BUN: 23 mg/dL (ref 8–23)
Calcium, Ion: 1.13 mmol/L — ABNORMAL LOW (ref 1.15–1.40)
Chloride: 103 mmol/L (ref 98–111)
Creatinine, Ser: 1 mg/dL (ref 0.61–1.24)
Glucose, Bld: 186 mg/dL — ABNORMAL HIGH (ref 70–99)
HCT: 42 % (ref 39.0–52.0)
Hemoglobin: 14.3 g/dL (ref 13.0–17.0)
Potassium: 3.5 mmol/L (ref 3.5–5.1)
Sodium: 140 mmol/L (ref 135–145)
TCO2: 23 mmol/L (ref 22–32)

## 2023-07-28 MED ORDER — OXYCODONE HCL ER 15 MG PO T12A
15.0000 mg | EXTENDED_RELEASE_TABLET | Freq: Two times a day (BID) | ORAL | Status: DC
  Administered 2023-07-28: 15 mg via ORAL
  Filled 2023-07-28: qty 1

## 2023-07-28 NOTE — ED Triage Notes (Signed)
BIB EMS due to f/u from suprapubic cath placement. Seen by Urology and after several attempts to insert suprapubic multiple attempts to insert reg foley, Questionable if foley is in properly. 110/74-66-98%

## 2023-07-28 NOTE — ED Provider Notes (Signed)
Peter Yang EMERGENCY DEPARTMENT AT [facility] Provider Note   CSN: [id number] Arrival date & time: 07/28/23  1433     History  Chief Complaint  Patient presents with   Cath problems    Peter Yang is a 67 y.o. male.  HPI   Patient has a history of BPH hypertension diabetes TIA coronary artery disease paroxysmal atrial fibrillation Fournier's gangrene retroperitoneal abscess.  Patient has history of a suprapubic catheter.  Patient reportedly had an office visit today at the urologist office to have the catheter replaced.  Patient states he had a lot of pain and discomfort with the procedure.  They were unable to replace the catheter.  He ultimately had to have a catheter placed through the penis.  Patient reportedly was going back to the nursing facility.  They refused to accept him back and he was sent to the ED.  Patient states he was hoping to be sedated in order to have his catheter replaced.  Home Medications Prior to Admission medications   Medication Sig Start Date End Date Taking? Authorizing Provider  acetaminophen (TYLENOL) 500 MG tablet Take 2 tablets (1,000 mg total) by mouth every 8 (eight) hours. 04/18/23   Joseph Art, DO  albuterol (PROVENTIL) (2.5 MG/3ML) 0.083% nebulizer solution Take 2.5 mg by nebulization every 4 (four) hours as needed for wheezing or shortness of breath.    [provider]  albuterol (VENTOLIN HFA) 108 (90 Base) MCG/ACT inhaler Inhale 2 puffs into the lungs every 4 (four) hours as needed for wheezing or shortness of breath.    [provider]  Amino Acids-Protein Hydrolys (FEEDING SUPPLEMENT, PRO-STAT SUGAR FREE 64,) LIQD Take 30 mLs by mouth daily.    [provider]  apixaban (ELIQUIS) 5 MG TABS tablet Take 1 tablet (5 mg total) by mouth 2 (two) times daily. 06/23/23 07/23/23  Leatha Gilding, MD  atorvastatin (LIPITOR) 80 MG tablet Take 0.5 tablets (40 mg total) by mouth every evening. 04/18/23    Joseph Art, DO  B Complex-C (B COMPLEX-VITAMIN C PO) Take 1 tablet by mouth daily. B complex/Vitamin C/Folic acid    [provider]  bisacodyl (DULCOLAX) 10 MG suppository Place 1 suppository (10 mg total) rectally daily as needed for moderate constipation. Patient not taking: Reported on 07/09/2023 10/24/22   Lanae Boast, MD  diclofenac Sodium (VOLTAREN) 1 % GEL Apply 2 g topically See admin instructions. "Apply to bilateral knees topically once time a day for pain. Apply 2 g to affected area 3 times daily" 07/20/21   [provider]  docusate sodium (COLACE) 100 MG capsule Take 1 capsule (100 mg total) by mouth 2 (two) times daily. 04/18/23   Joseph Art, DO  escitalopram (LEXAPRO) 10 MG tablet Take 10 mg by mouth daily.    [provider]  feeding supplement, GLUCERNA SHAKE, (GLUCERNA SHAKE) LIQD Take 237 mLs by mouth 3 (three) times daily between meals. 06/19/23   Leatha Gilding, MD  ferrous sulfate 325 (65 FE) MG tablet Take 1 tablet (325 mg total) by mouth daily with breakfast. 10/25/22   Lanae Boast, MD  finasteride (PROSCAR) 5 MG tablet Take 5 mg by mouth daily.    [provider]  folic acid (FOLVITE) 1 MG tablet Take 1 tablet (1 mg total) by mouth daily. 05/14/23   Leroy Sea, MD  furosemide (LASIX) 40 MG tablet Take 1 tablet (40 mg total) by mouth daily. 06/19/23   Gherghe, Stevphen Meuse  M, MD  gabapentin (NEURONTIN) 300 MG capsule Take 1 capsule (300 mg total) by mouth at bedtime. 04/18/23   Joseph Art, DO  HUMALOG KWIKPEN 100 UNIT/ML KwikPen Inject 0-15 Units into the skin See admin instructions. Inject 0-15 units three times daily before meals per sliding scale: BS < 60 call MD 0 - 100 : 0 units 101-150 : 3 units 151-200 : 4 units 201-250 : 7 units 251-300 : 9 units 301-350 : 12 units 351-400 : 15 units  BS > 400 call MD 04/22/23   [provider]  hydrocortisone (CORTEF) 5 MG tablet Take 1 tablet (5 mg total) by mouth 2 (two) times  daily. 04/18/23   Joseph Art, DO  insulin glargine-yfgn (SEMGLEE) 100 UNIT/ML injection Inject 0.2 mLs (20 Units total) into the skin at bedtime. 04/18/23   Joseph Art, DO  leptospermum manuka honey (MEDIHONEY) PSTE paste Apply 1 Application topically daily. 06/20/23   Gherghe, Daylene Katayama, MD  melatonin 5 MG TABS Take 1 tablet (5 mg total) by mouth daily at 6 PM. Patient taking differently: Take 5 mg by mouth at bedtime. 10/24/22   Lanae Boast, MD  midodrine (PROAMATINE) 10 MG tablet Take 1 tablet (10 mg total) by mouth with breakfast, with lunch, and with evening meal. 04/18/23   Joseph Art, DO  Multiple Vitamin (MULTIVITAMIN WITH MINERALS) TABS tablet Take 1 tablet by mouth daily. 06/20/23   Leatha Gilding, MD  nystatin (MYCOSTATIN/NYSTOP) powder Apply topically 3 (three) times daily. Patient taking differently: Apply 1 Application topically 2 (two) times daily. 05/13/23   Leroy Sea, MD  oxycodone (OXY-IR) 5 MG capsule Take 1 capsule (5 mg total) by mouth every 4 (four) hours as needed (moderate to severe pain). 06/19/23   Leatha Gilding, MD  oxyCODONE (OXYCONTIN) 15 mg 12 hr tablet Take 1 tablet (15 mg total) by mouth every 12 (twelve) hours. 06/19/23   Leatha Gilding, MD  pantoprazole (PROTONIX) 40 MG tablet Take 1 tablet (40 mg total) by mouth daily. 06/30/22   Pokhrel, Rebekah Chesterfield, MD  saccharomyces boulardii (FLORASTOR) 250 MG capsule Take 250 mg by mouth 2 (two) times daily.    [provider]  senna-docusate (SENOKOT-S) 8.6-50 MG tablet Take 1 tablet by mouth 2 (two) times daily. 10/24/22   Lanae Boast, MD  tamsulosin (FLOMAX) 0.4 MG CAPS capsule Take 0.4 mg by mouth daily. 07/29/22   [provider]  traZODone (DESYREL) 50 MG tablet Take 50 mg by mouth at bedtime. 06/25/23   [provider]  vitamin A 3 MG (10000 UNITS) capsule Take 1 capsule (10,000 Units total) by mouth daily. 06/20/23   Leatha Gilding, MD  zinc sulfate 220 (50 Zn) MG capsule Take 1  capsule (220 mg total) by mouth daily. 06/20/23   Leatha Gilding, MD      Allergies    Sglt2 inhibitors    Review of Systems   Review of Systems  Physical Exam Updated Vital Signs BP 107/70 (BP Location: Right Arm)   Pulse 83   Temp 98 F (36.7 C) (Axillary)   Resp 16   Ht 1.886 m (6' 2.25")   Wt 108.1 kg   SpO2 94%   BMI 30.39 kg/m  Physical Exam Vitals and nursing note reviewed.  Constitutional:      Appearance: He is well-developed. He is ill-appearing.  HENT:     Head: Normocephalic and atraumatic.     Right Ear: External ear normal.  Left Ear: External ear normal.  Eyes:     General: No scleral icterus.       Right eye: No discharge.        Left eye: No discharge.     Conjunctiva/sclera: Conjunctivae normal.  Neck:     Trachea: No tracheal deviation.  Cardiovascular:     Rate and Rhythm: Normal rate.  Pulmonary:     Effort: Pulmonary effort is normal. No respiratory distress.     Breath sounds: No stridor.  Abdominal:     General: There is no distension.  Genitourinary:    Comments: Patient has a suprapubic ostomy site with no catheter in place, patient also has a replaced through the urethra, serosanguineous urine draining Musculoskeletal:        General: No swelling or deformity.     Cervical back: Neck supple.  Skin:    General: Skin is warm and dry.     Findings: No rash.  Neurological:     Mental Status: He is alert. Mental status is at baseline.     Cranial Nerves: No dysarthria or facial asymmetry.     Motor: No seizure activity.     ED Results / Procedures / Treatments   Labs (all labs ordered are listed, but only abnormal results are displayed) Labs Reviewed  I-STAT CHEM 8, ED - Abnormal; Notable for the following components:      Result Value   Glucose, Bld 186 (*)    Calcium, Ion 1.13 (*)    All other components within normal limits    EKG None  Radiology No results found.  Procedures Procedures    Medications Ordered  in ED Medications - No data to display  ED Course/ Medical Decision Making/ A&P Clinical Course as of 07/28/23 1654  Mon Jul 28, 2023  1610 I reviewed the case with Dr. Margo Aye.  He reviewed the note at the urology office today.  Patient had a catheter placed over a wire.  Patient did have direct ureteroscopy cystoscopy to place the catheter.  Plan is to have the patient return to the office in a month. [JK]  1653 I-STAT without signs of anemia or significant electrolyte abnormalities [JK]    Clinical Course User Index [JK] Linwood Dibbles, MD                                 Medical Decision Making Problems Addressed: Urinary catheter complication, initial encounter [facility]): acute illness or injury that poses a threat to life or bodily functions  Amount and/or Complexity of Data Reviewed Labs: ordered. Decision-making details documented in ED Course.   Patient presented to the ED for evaluation with concerns regarding his urinary catheter.  Patient was just at the urologist office today.  He was there for scheduled catheter placement.  Patient reports they had significant difficulty getting his catheter replaced.  Ultimately they ended up putting a urethral catheter.  Patient came to the ED hoping he could be sedated in the operating room and have a suprapubic catheter replaced.  I consulted with urology. .  Explained to him that the catheter was placed under direct visualization and the urologist confirmed it is in the appropriate location.  In the ED the catheter is draining properly.  There is no significant electrolyte abnormalities.  Patient is stable to return to the nursing facility.  Outpatient follow-up with urology.        Final Clinical  Impression(s) / ED Diagnoses Final diagnoses:  Urinary catheter complication, initial encounter [facility])    Rx / DC Orders ED Discharge Orders     None         Linwood Dibbles, MD 07/28/23 1657

## 2023-07-28 NOTE — ED Notes (Signed)
Call placed to [facility] for transportation.  

## 2023-07-28 NOTE — Discharge Instructions (Signed)
I reviewed the case with the urologist on-call.  He reviewed your office visit today.  They did place your catheter under direct visualization using a scope and a wire.  The catheter is in the appropriate location.  They plan on having you use the catheter placed today instead of a suprapubic catheter because of the difficulty they had.  Follow-up with the urologist as previously instructed

## 2023-09-01 ENCOUNTER — Other Ambulatory Visit: Payer: Self-pay

## 2023-09-01 ENCOUNTER — Ambulatory Visit (INDEPENDENT_AMBULATORY_CARE_PROVIDER_SITE_OTHER): Payer: Medicaid Other | Admitting: Internal Medicine

## 2023-09-01 VITALS — BP 134/84 | HR 93 | Temp 97.4°F

## 2023-09-01 DIAGNOSIS — N493 Fournier gangrene: Secondary | ICD-10-CM | POA: Diagnosis present

## 2023-09-01 NOTE — Patient Instructions (Addendum)
#  scrotal wound -Labs today off of abx done in clinic -Needs to f.u with urology as previously discussed if he hasn't already. Continue wound care -F/U with ID PRN  #SHOB after meals -this has been going on for 2 weeks per pt. He is on room air.  -Needs to f/u with primary or go to ED if symptoms worsen or fail to improve

## 2023-09-01 NOTE — Progress Notes (Unsigned)
Patient: Peter Yang  DOB: [birth year] MRN: [id number] PCP: Patient, No Pcp Per  Chief Complaint  Patient presents with   Follow-up     Patient Active Problem List   Diagnosis Date Noted   History of MDR Pseudomonas aeruginosa infection 06/07/2023   Malnutrition of moderate degree 06/06/2023   Wound dehiscence in puerperium, perineal 06/04/2023   AMS (altered mental status) 05/07/2023   Seizure-like activity (HCC) 05/06/2023   Altered mental status 05/06/2023   Fournier's gangrene in male 04/02/2023   Soft tissue infection 04/02/2023   Necrotizing soft tissue infection 04/02/2023   UTI (urinary tract infection) due to urinary indwelling catheter (HCC) 04/01/2023   S/P BKA (below knee amputation) unilateral, left (HCC) 04/01/2023   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) 04/01/2023   Long term current use of systemic steroids 04/01/2023   Thrombocytopenia (HCC) 10/01/2022   Orthostatic hypotension 09/24/2022   HFrEF (heart failure with reduced ejection fraction) (HCC) 09/23/2022   Counseling and coordination of care 09/15/2022   Pain 09/14/2022   High risk medication use 09/14/2022   Need for emotional support 09/12/2022   Mood altered 09/11/2022   Medication management 09/11/2022   Palliative care by specialist 09/08/2022   Goals of care, counseling/discussion 09/08/2022   Physical deconditioning 09/05/2022   Gross hematuria 08/31/2022   Type 2 diabetes mellitus with diabetic peripheral angiopathy without gangrene, without long-term current use of insulin (HCC)    Impaired functional mobility, balance, gait, and endurance 08/03/2022   Obstructive sleep apnea syndrome 08/03/2022   Pleural effusion, bilateral 08/03/2022   Catheter-associated urinary tract infection (HCC) 08/03/2022   History of urinary retention 08/03/2022   Mitral valve regurgitation 08/03/2022   Thoracic ascending aortic aneurysm (HCC) 07/23/2022   Borderline low  blood pressure determined by examination 07/04/2022   Coronary artery disease due to lipid rich plaque 06/25/2022   Hematuria 06/25/2022   History of non-ST elevation myocardial infarction (NSTEMI) 06/15/2022   PAF (paroxysmal atrial fibrillation) (HCC) 06/15/2022   Dyslipidemia 06/15/2022   PVD (peripheral vascular disease) (HCC) 06/15/2022   Pressure ulcer of sacral region, stage 2 (HCC) 06/15/2022   Chronic HFrEF (heart failure with reduced ejection fraction) (HCC) 06/15/2022   DNR (do not resuscitate) 06/15/2022   Ischemic cardiomyopathy 02/12/2022   Need for assistance with personal care 08/03/2021   Diabetic polyneuropathy associated with type 2 diabetes mellitus (HCC) 07/28/2021   Prolonged QT interval 07/28/2021   Moderate protein-calorie malnutrition (HCC) 06/29/2021   Chronic insomnia 03/31/2021   BPH with obstruction/lower urinary tract symptoms 02/02/2018   Hyperlipidemia 04/15/2016   Vitamin D deficiency 04/09/2016   Essential hypertension 01/25/2016     Subjective:  Peter Yang is a 67 y.o. M with past medical history as below including istory of forearm secondary status post I&D on 6/12, 03/1919 24 status post ertapenem/Dapto x 6 weeks EOT 7/30 presents for hospital follow-up of persistent wound from Fournier's gangrene site.  He underwent debridement of scrotal abscess on 8/14 with urology.  Cultures grew Klebsiella pneumonia and MDR Pseudomonas aeruginosa.  Noted urethral fistula, consulted IR for suprapubic diverting urine from urethra.  Discharged on Avycaz x 3 weeks from OR 8/18 EOT 9/7. - Seen by Dr. Renold Don on 9/6 who extended antibiotics.  Noted wound had improved, although swollen and tender. Today 11/11: no complaints form scortal wound. SHOB after meals. Denies chest pain. On room air. Review of Systems  All other systems reviewed and are negative.  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 Overview:  Added automatically from request for surgery [id number] Formatting of this note might be different from the original. Added automatically from request for surgery [id number]   Large bowel perforation (HCC) 09/27/2015   Malignant neoplasm of prostate (HCC) 10/22/2019   Myopia with astigmatism and presbyopia, bilateral 06/15/2018   Last Assessment & Plan:  Formatting of this note might be different from the original. - Wrx printed Formatting of this note might be different from the original. 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 - Stable, monitor Formatting of this note might be different from the original. Last Assessment & Plan:  - Stable, monitor   Retroperitoneal abscess (HCC) 10/02/2015   Status post corneal transplant 10/07/2016   Formatting of this note might be different from the original. Overview:  Added automatically from request for surgery [id number] Formatting of this note might be  different from the original. Added automatically from request for surgery [id number]  Last Assessment & Plan:  Formatting of this note might be different from the original. - OD, 2/2 KCN - Stable, no NVK / rejection - Continue off steroids - Mo   Thoracic ascending aortic aneurysm (HCC)    TIA (transient ischemic attack) 2012   Type 2 diabetes mellitus (HCC)     Outpatient Medications Prior to Visit  Medication Sig Dispense Refill   acetaminophen (TYLENOL) 500 MG tablet Take 2 tablets (1,000 mg total) by mouth every 8 (eight) hours. 30 tablet 0   albuterol (PROVENTIL) (2.5 MG/3ML) 0.083% nebulizer solution Take 2.5 mg by nebulization every 4 (four) hours as needed for wheezing or shortness of breath.     albuterol (VENTOLIN HFA) 108 (90 Base) MCG/ACT inhaler Inhale 2 puffs into the lungs every 4 (four) hours as needed for wheezing or shortness of breath.     Amino Acids-Protein Hydrolys (FEEDING SUPPLEMENT, PRO-STAT SUGAR FREE 64,) LIQD Take 30 mLs by mouth daily.     apixaban (ELIQUIS) 5 MG TABS tablet Take 1 tablet (5 mg total) by mouth 2 (two) times daily.     atorvastatin (LIPITOR) 80 MG tablet Take 0.5 tablets (40 mg total) by mouth every evening. 30 tablet 0   B Complex-C (B COMPLEX-VITAMIN C PO) Take 1 tablet by mouth daily. B complex/Vitamin C/Folic acid     bisacodyl (DULCOLAX) 10 MG suppository Place 1 suppository (10 mg total) rectally daily as needed for moderate constipation. (  Patient not taking: Reported on 07/09/2023) 12 suppository 0   diclofenac Sodium (VOLTAREN) 1 % GEL Apply 2 g topically See admin instructions. "Apply to bilateral knees topically once time a day for pain. Apply 2 g to affected area 3 times daily"     docusate sodium (COLACE) 100 MG capsule Take 1 capsule (100 mg total) by mouth 2 (two) times daily. 10 capsule 0   escitalopram (LEXAPRO) 10 MG tablet Take 10 mg by mouth daily.     feeding supplement, GLUCERNA SHAKE, (GLUCERNA SHAKE) LIQD Take 237 mLs by mouth 3  (three) times daily between meals.     ferrous sulfate 325 (65 FE) MG tablet Take 1 tablet (325 mg total) by mouth daily with breakfast.  3   finasteride (PROSCAR) 5 MG tablet Take 5 mg by mouth daily.     folic acid (FOLVITE) 1 MG tablet Take 1 tablet (1 mg total) by mouth daily.     furosemide (LASIX) 40 MG tablet Take 1 tablet (40 mg total) by mouth daily.     gabapentin (NEURONTIN) 300 MG capsule Take 1 capsule (300 mg total) by mouth at bedtime.     HUMALOG KWIKPEN 100 UNIT/ML KwikPen Inject 0-15 Units into the skin See admin instructions. Inject 0-15 units three times daily before meals per sliding scale: BS < 60 call MD 0 - 100 : 0 units 101-150 : 3 units 151-200 : 4 units 201-250 : 7 units 251-300 : 9 units 301-350 : 12 units 351-400 : 15 units  BS > 400 call MD     hydrocortisone (CORTEF) 5 MG tablet Take 1 tablet (5 mg total) by mouth 2 (two) times daily.     insulin glargine-yfgn (SEMGLEE) 100 UNIT/ML injection Inject 0.2 mLs (20 Units total) into the skin at bedtime. 10 mL 11   leptospermum manuka honey (MEDIHONEY) PSTE paste Apply 1 Application topically daily.     melatonin 5 MG TABS Take 1 tablet (5 mg total) by mouth daily at 6 PM. (Patient taking differently: Take 5 mg by mouth at bedtime.)  0   midodrine (PROAMATINE) 10 MG tablet Take 1 tablet (10 mg total) by mouth with breakfast, with lunch, and with evening meal.     Multiple Vitamin (MULTIVITAMIN WITH MINERALS) TABS tablet Take 1 tablet by mouth daily.     nystatin (MYCOSTATIN/NYSTOP) powder Apply topically 3 (three) times daily. (Patient taking differently: Apply 1 Application topically 2 (two) times daily.) 15 g 0   oxycodone (OXY-IR) 5 MG capsule Take 1 capsule (5 mg total) by mouth every 4 (four) hours as needed (moderate to severe pain). 12 capsule 0   oxyCODONE (OXYCONTIN) 15 mg 12 hr tablet Take 1 tablet (15 mg total) by mouth every 12 (twelve) hours. 10 tablet 0   pantoprazole (PROTONIX) 40 MG tablet Take 1  tablet (40 mg total) by mouth daily. 30 tablet 2   saccharomyces boulardii (FLORASTOR) 250 MG capsule Take 250 mg by mouth 2 (two) times daily.     senna-docusate (SENOKOT-S) 8.6-50 MG tablet Take 1 tablet by mouth 2 (two) times daily.     tamsulosin (FLOMAX) 0.4 MG CAPS capsule Take 0.4 mg by mouth daily.     traZODone (DESYREL) 50 MG tablet Take 50 mg by mouth at bedtime.     vitamin A 3 MG (10000 UNITS) capsule Take 1 capsule (10,000 Units total) by mouth daily.     zinc sulfate 220 (50 Zn) MG capsule Take 1 capsule (220 mg  total) by mouth daily.     No facility-administered medications prior to visit.     Allergies  Allergen Reactions   Sglt2 Inhibitors Other (See Comments)    Fournier's gangrene    Social History   Tobacco Use   Smoking status: Never   Smokeless tobacco: Never  Substance Use Topics   Alcohol use: Never   Drug use: Never    Family History  Problem Relation Age of Onset   Diabetes Mother    Hypertension Mother    Heart disease Mother    Cancer Father    Heart attack Sister    Cancer Sister    Diabetes Brother     Objective:   Vitals:   09/01/23 1024  BP: 134/84  Pulse: 93  Temp: (!) 97.4 F (36.3 C)  TempSrc: Temporal  SpO2: 95%   There is no height or weight on file to calculate BMI.  Physical Exam Constitutional:      General: He is not in acute distress.    Appearance: He is normal weight. He is not toxic-appearing.     Comments: Presents on a stretcher  HENT:     Head: Normocephalic and atraumatic.     Right Ear: External ear normal.     Left Ear: External ear normal.     Nose: No congestion or rhinorrhea.     Mouth/Throat:     Mouth: Mucous membranes are moist.     Pharynx: Oropharynx is clear.  Eyes:     Extraocular Movements: Extraocular movements intact.     Conjunctiva/sclera: Conjunctivae normal.     Pupils: Pupils are equal, round, and reactive to light.  Cardiovascular:     Rate and Rhythm: Normal rate and regular  rhythm.     Heart sounds: No murmur heard.    No friction rub. No gallop.  Pulmonary:     Effort: Pulmonary effort is normal.     Breath sounds: Normal breath sounds.  Abdominal:     General: Abdomen is flat. Bowel sounds are normal.     Palpations: Abdomen is soft.  Musculoskeletal:        General: No swelling.     Cervical back: Normal range of motion and neck supple.  Skin:    General: Skin is warm and dry.  Neurological:     General: No focal deficit present.     Mental Status: He is oriented to person, place, and time.  Psychiatric:        Mood and Affect: Mood normal.     Lab Results: Lab Results  Component Value Date   WBC 6.1 07/09/2023   HGB 14.3 07/28/2023   HCT 42.0 07/28/2023   MCV 86.4 07/09/2023   PLT 142 07/09/2023    Lab Results  Component Value Date   CREATININE 1.00 07/28/2023   BUN 23 07/28/2023   NA 140 07/28/2023   K 3.5 07/28/2023   CL 103 07/28/2023   CO2 23 07/09/2023    Lab Results  Component Value Date   ALT 7 (L) 07/09/2023   AST 15 07/09/2023   ALKPHOS 71 06/19/2023   BILITOT 0.6 07/09/2023     Assessment & Plan:  #History of foreigners gangrene with persistent wound drainage with MDR Pseudomonas # Status post CT-guided suprapubic catheter placement today 8/19 - Seen with Dr. Renold Don on 9/6, avycaz extended till 9/13 as wound swollen and tender but improved since discharge -Urology f/u on 1017 - 07/09/23:  minimal tenderness noted.  No  purulence noted on bandage side.  Wound appears to be healing well.  Labs stable. .  He has received over 4 weeks antibiotics, given clinical improvement and stable left as such abx were stopped Plan -Labs today off of abx done in clinic. Wound was not concerning for acute infection today. -Needs to f/u with urology as previously discussed if he hasn't already. Continue wound care -F/U with ID PRN  #SHOB after meals -this has been going on for 2 weeks per pt -Needs to f/u with primay  Danelle Earthly,  MD [facility]   09/01/23  10:28 AM I have personally spent 35 minutes involved in face-to-face and non-face-to-face activities for this patient on the day of the visit. Professional time spent includes the following activities: Preparing to see the patient (review of tests), Obtaining and/or reviewing separately obtained history (admission/discharge record), Performing a medically appropriate examination and/or evaluation , Ordering medications/tests/procedures, referring and communicating with other health care professionals, Documenting clinical information in the EMR, Independently interpreting results (not separately reported), Communicating results to the patient/family/caregiver, Counseling and educating the patient/family/caregiver and Care coordination (not separately reported).

## 2023-09-02 LAB — CBC WITH DIFFERENTIAL/PLATELET
Absolute Lymphocytes: 1267 {cells}/uL (ref 850–3900)
Absolute Monocytes: 1051 {cells}/uL — ABNORMAL HIGH (ref 200–950)
Basophils Absolute: 41 {cells}/uL (ref 0–200)
Basophils Relative: 0.4 %
Eosinophils Absolute: 227 {cells}/uL (ref 15–500)
Eosinophils Relative: 2.2 %
HCT: 37.9 % — ABNORMAL LOW (ref 38.5–50.0)
Hemoglobin: 12 g/dL — ABNORMAL LOW (ref 13.2–17.1)
MCH: 26.5 pg — ABNORMAL LOW (ref 27.0–33.0)
MCHC: 31.7 g/dL — ABNORMAL LOW (ref 32.0–36.0)
MCV: 83.8 fL (ref 80.0–100.0)
MPV: 11.3 fL (ref 7.5–12.5)
Monocytes Relative: 10.2 %
Neutro Abs: 7715 {cells}/uL (ref 1500–7800)
Neutrophils Relative %: 74.9 %
Platelets: 206 10*3/uL (ref 140–400)
RBC: 4.52 10*6/uL (ref 4.20–5.80)
RDW: 18 % — ABNORMAL HIGH (ref 11.0–15.0)
Total Lymphocyte: 12.3 %
WBC: 10.3 10*3/uL (ref 3.8–10.8)

## 2023-09-02 LAB — COMPLETE METABOLIC PANEL WITH GFR
AG Ratio: 1 (calc) (ref 1.0–2.5)
ALT: 9 U/L (ref 9–46)
AST: 18 U/L (ref 10–35)
Albumin: 2.8 g/dL — ABNORMAL LOW (ref 3.6–5.1)
Alkaline phosphatase (APISO): 87 U/L (ref 35–144)
BUN: 18 mg/dL (ref 7–25)
CO2: 24 mmol/L (ref 20–32)
Calcium: 8.1 mg/dL — ABNORMAL LOW (ref 8.6–10.3)
Chloride: 102 mmol/L (ref 98–110)
Creat: 0.8 mg/dL (ref 0.70–1.35)
Globulin: 2.9 g/dL (ref 1.9–3.7)
Glucose, Bld: 153 mg/dL — ABNORMAL HIGH (ref 65–99)
Potassium: 3.3 mmol/L — ABNORMAL LOW (ref 3.5–5.3)
Sodium: 136 mmol/L (ref 135–146)
Total Bilirubin: 0.8 mg/dL (ref 0.2–1.2)
Total Protein: 5.7 g/dL — ABNORMAL LOW (ref 6.1–8.1)
eGFR: 98 mL/min/{1.73_m2} (ref >=60)

## 2023-10-29 ENCOUNTER — Inpatient Hospital Stay (HOSPITAL_COMMUNITY)
Admission: EM | Admit: 2023-10-29 | Discharge: 2023-11-08 | DRG: 871 | Disposition: A | Discharge status: Hospice | Payer: Medicare Other | Source: Skilled Nursing Facility | Attending: Internal Medicine | Admitting: Internal Medicine

## 2023-10-29 ENCOUNTER — Emergency Department (HOSPITAL_COMMUNITY): Payer: Medicare Other

## 2023-10-29 ENCOUNTER — Inpatient Hospital Stay (HOSPITAL_COMMUNITY): Payer: Medicare Other

## 2023-10-29 DIAGNOSIS — E2740 Unspecified adrenocortical insufficiency: Secondary | ICD-10-CM | POA: Diagnosis present

## 2023-10-29 DIAGNOSIS — Z8719 Personal history of other diseases of the digestive system: Secondary | ICD-10-CM

## 2023-10-29 DIAGNOSIS — N179 Acute kidney failure, unspecified: Secondary | ICD-10-CM | POA: Diagnosis present

## 2023-10-29 DIAGNOSIS — Z66 Do not resuscitate: Secondary | ICD-10-CM | POA: Diagnosis present

## 2023-10-29 DIAGNOSIS — I5022 Chronic systolic (congestive) heart failure: Secondary | ICD-10-CM | POA: Diagnosis present

## 2023-10-29 DIAGNOSIS — I252 Old myocardial infarction: Secondary | ICD-10-CM

## 2023-10-29 DIAGNOSIS — R579 Shock, unspecified: Secondary | ICD-10-CM | POA: Diagnosis present

## 2023-10-29 DIAGNOSIS — Z8619 Personal history of other infectious and parasitic diseases: Secondary | ICD-10-CM

## 2023-10-29 DIAGNOSIS — A4159 Other Gram-negative sepsis: Secondary | ICD-10-CM | POA: Diagnosis present

## 2023-10-29 DIAGNOSIS — G894 Chronic pain syndrome: Secondary | ICD-10-CM | POA: Diagnosis present

## 2023-10-29 DIAGNOSIS — L89150 Pressure ulcer of sacral region, unstageable: Secondary | ICD-10-CM | POA: Diagnosis not present

## 2023-10-29 DIAGNOSIS — Z961 Presence of intraocular lens: Secondary | ICD-10-CM | POA: Diagnosis present

## 2023-10-29 DIAGNOSIS — R7881 Bacteremia: Principal | ICD-10-CM | POA: Diagnosis present

## 2023-10-29 DIAGNOSIS — G4710 Hypersomnia, unspecified: Secondary | ICD-10-CM | POA: Diagnosis present

## 2023-10-29 DIAGNOSIS — M25552 Pain in left hip: Secondary | ICD-10-CM | POA: Diagnosis present

## 2023-10-29 DIAGNOSIS — Z1624 Resistance to multiple antibiotics: Secondary | ICD-10-CM | POA: Diagnosis present

## 2023-10-29 DIAGNOSIS — Z7952 Long term (current) use of systemic steroids: Secondary | ICD-10-CM

## 2023-10-29 DIAGNOSIS — L97413 Non-pressure chronic ulcer of right heel and midfoot with necrosis of muscle: Secondary | ICD-10-CM | POA: Diagnosis present

## 2023-10-29 DIAGNOSIS — Q6612 Congenital talipes calcaneovarus, left foot: Secondary | ICD-10-CM

## 2023-10-29 DIAGNOSIS — Z888 Allergy status to other drugs, medicaments and biological substances status: Secondary | ICD-10-CM

## 2023-10-29 DIAGNOSIS — D692 Other nonthrombocytopenic purpura: Secondary | ICD-10-CM | POA: Diagnosis present

## 2023-10-29 DIAGNOSIS — Z89512 Acquired absence of left leg below knee: Secondary | ICD-10-CM

## 2023-10-29 DIAGNOSIS — N3289 Other specified disorders of bladder: Secondary | ICD-10-CM | POA: Diagnosis present

## 2023-10-29 DIAGNOSIS — R6521 Severe sepsis with septic shock: Secondary | ICD-10-CM | POA: Diagnosis present

## 2023-10-29 DIAGNOSIS — Z789 Other specified health status: Secondary | ICD-10-CM | POA: Diagnosis not present

## 2023-10-29 DIAGNOSIS — R064 Hyperventilation: Secondary | ICD-10-CM | POA: Diagnosis not present

## 2023-10-29 DIAGNOSIS — Z809 Family history of malignant neoplasm, unspecified: Secondary | ICD-10-CM

## 2023-10-29 DIAGNOSIS — Z955 Presence of coronary angioplasty implant and graft: Secondary | ICD-10-CM

## 2023-10-29 DIAGNOSIS — I7121 Aneurysm of the ascending aorta, without rupture: Secondary | ICD-10-CM | POA: Diagnosis present

## 2023-10-29 DIAGNOSIS — E1165 Type 2 diabetes mellitus with hyperglycemia: Secondary | ICD-10-CM | POA: Diagnosis present

## 2023-10-29 DIAGNOSIS — R54 Age-related physical debility: Secondary | ICD-10-CM | POA: Diagnosis present

## 2023-10-29 DIAGNOSIS — Z515 Encounter for palliative care: Secondary | ICD-10-CM | POA: Diagnosis not present

## 2023-10-29 DIAGNOSIS — Z1612 Extended spectrum beta lactamase (ESBL) resistance: Secondary | ICD-10-CM | POA: Diagnosis not present

## 2023-10-29 DIAGNOSIS — Z7982 Long term (current) use of aspirin: Secondary | ICD-10-CM

## 2023-10-29 DIAGNOSIS — I6601 Occlusion and stenosis of right middle cerebral artery: Secondary | ICD-10-CM | POA: Diagnosis present

## 2023-10-29 DIAGNOSIS — G9340 Encephalopathy, unspecified: Secondary | ICD-10-CM | POA: Diagnosis not present

## 2023-10-29 DIAGNOSIS — A419 Sepsis, unspecified organism: Secondary | ICD-10-CM

## 2023-10-29 DIAGNOSIS — E871 Hypo-osmolality and hyponatremia: Secondary | ICD-10-CM | POA: Diagnosis not present

## 2023-10-29 DIAGNOSIS — R17 Unspecified jaundice: Secondary | ICD-10-CM | POA: Diagnosis present

## 2023-10-29 DIAGNOSIS — Z8673 Personal history of transient ischemic attack (TIA), and cerebral infarction without residual deficits: Secondary | ICD-10-CM

## 2023-10-29 DIAGNOSIS — Z9889 Other specified postprocedural states: Secondary | ICD-10-CM

## 2023-10-29 DIAGNOSIS — R310 Gross hematuria: Secondary | ICD-10-CM

## 2023-10-29 DIAGNOSIS — J150 Pneumonia due to Klebsiella pneumoniae: Secondary | ICD-10-CM | POA: Diagnosis present

## 2023-10-29 DIAGNOSIS — I2510 Atherosclerotic heart disease of native coronary artery without angina pectoris: Secondary | ICD-10-CM | POA: Diagnosis present

## 2023-10-29 DIAGNOSIS — Z8546 Personal history of malignant neoplasm of prostate: Secondary | ICD-10-CM

## 2023-10-29 DIAGNOSIS — I9589 Other hypotension: Secondary | ICD-10-CM | POA: Diagnosis present

## 2023-10-29 DIAGNOSIS — N401 Enlarged prostate with lower urinary tract symptoms: Secondary | ICD-10-CM | POA: Diagnosis present

## 2023-10-29 DIAGNOSIS — L97414 Non-pressure chronic ulcer of right heel and midfoot with necrosis of bone: Secondary | ICD-10-CM | POA: Diagnosis present

## 2023-10-29 DIAGNOSIS — E876 Hypokalemia: Secondary | ICD-10-CM | POA: Diagnosis present

## 2023-10-29 DIAGNOSIS — I480 Paroxysmal atrial fibrillation: Secondary | ICD-10-CM | POA: Diagnosis present

## 2023-10-29 DIAGNOSIS — E66811 Obesity, class 1: Secondary | ICD-10-CM | POA: Diagnosis present

## 2023-10-29 DIAGNOSIS — E785 Hyperlipidemia, unspecified: Secondary | ICD-10-CM | POA: Diagnosis present

## 2023-10-29 DIAGNOSIS — Z8249 Family history of ischemic heart disease and other diseases of the circulatory system: Secondary | ICD-10-CM

## 2023-10-29 DIAGNOSIS — L89159 Pressure ulcer of sacral region, unspecified stage: Secondary | ICD-10-CM | POA: Diagnosis not present

## 2023-10-29 DIAGNOSIS — J90 Pleural effusion, not elsewhere classified: Secondary | ICD-10-CM | POA: Diagnosis not present

## 2023-10-29 DIAGNOSIS — E43 Unspecified severe protein-calorie malnutrition: Secondary | ICD-10-CM | POA: Diagnosis present

## 2023-10-29 DIAGNOSIS — G9341 Metabolic encephalopathy: Secondary | ICD-10-CM | POA: Diagnosis present

## 2023-10-29 DIAGNOSIS — Z7901 Long term (current) use of anticoagulants: Secondary | ICD-10-CM

## 2023-10-29 DIAGNOSIS — I96 Gangrene, not elsewhere classified: Secondary | ICD-10-CM | POA: Diagnosis not present

## 2023-10-29 DIAGNOSIS — L89616 Pressure-induced deep tissue damage of right heel: Secondary | ICD-10-CM | POA: Diagnosis present

## 2023-10-29 DIAGNOSIS — Z79899 Other long term (current) drug therapy: Secondary | ICD-10-CM

## 2023-10-29 DIAGNOSIS — D6489 Other specified anemias: Secondary | ICD-10-CM | POA: Diagnosis present

## 2023-10-29 DIAGNOSIS — Z8739 Personal history of other diseases of the musculoskeletal system and connective tissue: Secondary | ICD-10-CM

## 2023-10-29 DIAGNOSIS — Z8701 Personal history of pneumonia (recurrent): Secondary | ICD-10-CM

## 2023-10-29 DIAGNOSIS — A499 Bacterial infection, unspecified: Secondary | ICD-10-CM

## 2023-10-29 DIAGNOSIS — I110 Hypertensive heart disease with heart failure: Secondary | ICD-10-CM | POA: Diagnosis present

## 2023-10-29 DIAGNOSIS — R1310 Dysphagia, unspecified: Secondary | ICD-10-CM | POA: Diagnosis not present

## 2023-10-29 DIAGNOSIS — N136 Pyonephrosis: Secondary | ICD-10-CM | POA: Diagnosis present

## 2023-10-29 DIAGNOSIS — N3001 Acute cystitis with hematuria: Secondary | ICD-10-CM | POA: Diagnosis not present

## 2023-10-29 DIAGNOSIS — E1152 Type 2 diabetes mellitus with diabetic peripheral angiopathy with gangrene: Secondary | ICD-10-CM | POA: Diagnosis not present

## 2023-10-29 DIAGNOSIS — Z794 Long term (current) use of insulin: Secondary | ICD-10-CM

## 2023-10-29 DIAGNOSIS — L89896 Pressure-induced deep tissue damage of other site: Secondary | ICD-10-CM | POA: Diagnosis present

## 2023-10-29 DIAGNOSIS — M25551 Pain in right hip: Secondary | ICD-10-CM | POA: Diagnosis present

## 2023-10-29 DIAGNOSIS — N138 Other obstructive and reflux uropathy: Secondary | ICD-10-CM | POA: Diagnosis present

## 2023-10-29 DIAGNOSIS — E1151 Type 2 diabetes mellitus with diabetic peripheral angiopathy without gangrene: Secondary | ICD-10-CM | POA: Diagnosis present

## 2023-10-29 DIAGNOSIS — Z7401 Bed confinement status: Secondary | ICD-10-CM

## 2023-10-29 DIAGNOSIS — E1142 Type 2 diabetes mellitus with diabetic polyneuropathy: Secondary | ICD-10-CM | POA: Diagnosis present

## 2023-10-29 DIAGNOSIS — Z993 Dependence on wheelchair: Secondary | ICD-10-CM

## 2023-10-29 DIAGNOSIS — B961 Klebsiella pneumoniae [K. pneumoniae] as the cause of diseases classified elsewhere: Secondary | ICD-10-CM | POA: Diagnosis not present

## 2023-10-29 DIAGNOSIS — E119 Type 2 diabetes mellitus without complications: Secondary | ICD-10-CM | POA: Diagnosis not present

## 2023-10-29 DIAGNOSIS — G4733 Obstructive sleep apnea (adult) (pediatric): Secondary | ICD-10-CM | POA: Diagnosis present

## 2023-10-29 DIAGNOSIS — Z833 Family history of diabetes mellitus: Secondary | ICD-10-CM

## 2023-10-29 DIAGNOSIS — Z7189 Other specified counseling: Secondary | ICD-10-CM | POA: Diagnosis not present

## 2023-10-29 DIAGNOSIS — Z6830 Body mass index (BMI) 30.0-30.9, adult: Secondary | ICD-10-CM

## 2023-10-29 DIAGNOSIS — L89156 Pressure-induced deep tissue damage of sacral region: Secondary | ICD-10-CM | POA: Diagnosis present

## 2023-10-29 DIAGNOSIS — L89151 Pressure ulcer of sacral region, stage 1: Secondary | ICD-10-CM | POA: Diagnosis present

## 2023-10-29 DIAGNOSIS — I255 Ischemic cardiomyopathy: Secondary | ICD-10-CM | POA: Diagnosis present

## 2023-10-29 DIAGNOSIS — F5104 Psychophysiologic insomnia: Secondary | ICD-10-CM | POA: Diagnosis present

## 2023-10-29 DIAGNOSIS — Z947 Corneal transplant status: Secondary | ICD-10-CM

## 2023-10-29 LAB — CBC WITH DIFFERENTIAL/PLATELET
Abs Immature Granulocytes: 0 10*3/uL (ref 0.00–0.07)
Basophils Absolute: 0 10*3/uL (ref 0.0–0.1)
Basophils Relative: 0 %
Eosinophils Absolute: 0 10*3/uL (ref 0.0–0.5)
Eosinophils Relative: 0 %
HCT: 32.4 % — ABNORMAL LOW (ref 39.0–52.0)
Hemoglobin: 10.5 g/dL — ABNORMAL LOW (ref 13.0–17.0)
Lymphocytes Relative: 4 %
Lymphs Abs: 1.3 10*3/uL (ref 0.7–4.0)
MCH: 29.5 pg (ref 26.0–34.0)
MCHC: 32.4 g/dL (ref 30.0–36.0)
MCV: 91 fL (ref 80.0–100.0)
Monocytes Absolute: 0.3 10*3/uL (ref 0.1–1.0)
Monocytes Relative: 1 %
Neutro Abs: 31.9 10*3/uL — ABNORMAL HIGH (ref 1.7–7.7)
Neutrophils Relative %: 95 %
Platelets: 227 10*3/uL (ref 150–400)
RBC: 3.56 MIL/uL — ABNORMAL LOW (ref 4.22–5.81)
RDW: 18.1 % — ABNORMAL HIGH (ref 11.5–15.5)
WBC: 33.6 10*3/uL — ABNORMAL HIGH (ref 4.0–10.5)
nRBC: 0 % (ref 0.0–0.2)
nRBC: 0 /100{WBCs}

## 2023-10-29 LAB — RESPIRATORY PANEL BY PCR

## 2023-10-29 LAB — CBC
HCT: 30.2 % — ABNORMAL LOW (ref 39.0–52.0)
HCT: 31 % — ABNORMAL LOW (ref 39.0–52.0)
Hemoglobin: 9.9 g/dL — ABNORMAL LOW (ref 13.0–17.0)
Hemoglobin: 10.2 g/dL — ABNORMAL LOW (ref 13.0–17.0)
MCH: 29.6 pg (ref 26.0–34.0)
MCH: 29.7 pg (ref 26.0–34.0)
MCHC: 32.8 g/dL (ref 30.0–36.0)
MCHC: 32.9 g/dL (ref 30.0–36.0)
MCV: 90.1 fL (ref 80.0–100.0)
MCV: 90.1 fL (ref 80.0–100.0)
Platelets: 220 10*3/uL (ref 150–400)
Platelets: 294 10*3/uL (ref 150–400)
RBC: 3.35 MIL/uL — ABNORMAL LOW (ref 4.22–5.81)
RBC: 3.44 MIL/uL — ABNORMAL LOW (ref 4.22–5.81)
RDW: 18.2 % — ABNORMAL HIGH (ref 11.5–15.5)
RDW: 18 % — ABNORMAL HIGH (ref 11.5–15.5)
WBC: 29 10*3/uL — ABNORMAL HIGH (ref 4.0–10.5)
WBC: 35.2 10*3/uL — ABNORMAL HIGH (ref 4.0–10.5)
nRBC: 0 % (ref 0.0–0.2)
nRBC: 0 % (ref 0.0–0.2)

## 2023-10-29 LAB — COMPREHENSIVE METABOLIC PANEL
ALT: 10 U/L (ref 0–44)
AST: 18 U/L (ref 15–41)
Albumin: 2.1 g/dL — ABNORMAL LOW (ref 3.5–5.0)
Alkaline Phosphatase: 99 U/L (ref 38–126)
Anion gap: 12 (ref 5–15)
BUN: 59 mg/dL — ABNORMAL HIGH (ref 8–23)
CO2: 24 mmol/L (ref 22–32)
Calcium: 8 mg/dL — ABNORMAL LOW (ref 8.9–10.3)
Chloride: 100 mmol/L (ref 98–111)
Creatinine, Ser: 1.73 mg/dL — ABNORMAL HIGH (ref 0.61–1.24)
GFR, Estimated: 43 mL/min — ABNORMAL LOW (ref >60)
Glucose, Bld: 100 mg/dL — ABNORMAL HIGH (ref 70–99)
Potassium: 3.4 mmol/L — ABNORMAL LOW (ref 3.5–5.1)
Sodium: 136 mmol/L (ref 135–145)
Total Bilirubin: 1.3 mg/dL — ABNORMAL HIGH (ref 0.0–1.2)
Total Protein: 5.7 g/dL — ABNORMAL LOW (ref 6.5–8.1)

## 2023-10-29 LAB — URINALYSIS, W/ REFLEX TO CULTURE (INFECTION SUSPECTED)
Protein, ur: NEGATIVE mg/dL
RBC / HPF: >50 RBC/hpf (ref 0–5)
RBC / HPF: >50 RBC/hpf (ref 0–5)

## 2023-10-29 LAB — I-STAT CG4 LACTIC ACID, ED
Lactic Acid, Venous: 1.9 mmol/L (ref 0.5–1.9)
Lactic Acid, Venous: 1.5 mmol/L (ref 0.5–1.9)

## 2023-10-29 LAB — BODY FLUID CELL COUNT WITH DIFFERENTIAL
Lymphs, Fluid: 18 %
Monocyte-Macrophage-Serous Fluid: 3 % — ABNORMAL LOW (ref 50–90)
Neutrophil Count, Fluid: 79 % — ABNORMAL HIGH (ref 0–25)
Total Nucleated Cell Count, Fluid: 56 uL (ref 0–1000)

## 2023-10-29 LAB — RESP PANEL BY RT-PCR (RSV, FLU A&B, COVID)  RVPGX2
Influenza A by PCR: NEGATIVE
Influenza B by PCR: NEGATIVE
Resp Syncytial Virus by PCR: NEGATIVE
SARS Coronavirus 2 by RT PCR: NEGATIVE

## 2023-10-29 LAB — GLUCOSE, PLEURAL OR PERITONEAL FLUID: Glucose, Fluid: 121 mg/dL

## 2023-10-29 LAB — PROTEIN, PLEURAL OR PERITONEAL FLUID: Total protein, fluid: <3 g/dL

## 2023-10-29 LAB — MRSA NEXT GEN BY PCR, NASAL: MRSA by PCR Next Gen: DETECTED — AB

## 2023-10-29 LAB — ALBUMIN, PLEURAL OR PERITONEAL FLUID: Albumin, Fluid: <1.5 g/dL

## 2023-10-29 LAB — LACTATE DEHYDROGENASE, PLEURAL OR PERITONEAL FLUID: LD, Fluid: 42 U/L — ABNORMAL HIGH (ref 3–23)

## 2023-10-29 LAB — HIV ANTIBODY (ROUTINE TESTING W REFLEX): HIV Screen 4th Generation wRfx: NONREACTIVE

## 2023-10-29 LAB — HEMOGLOBIN A1C
Hgb A1c MFr Bld: 6 % — ABNORMAL HIGH (ref 4.8–5.6)
Mean Plasma Glucose: 125.5 mg/dL

## 2023-10-29 LAB — APTT: aPTT: 37 s — ABNORMAL HIGH (ref 24–36)

## 2023-10-29 LAB — GLUCOSE, CAPILLARY: Glucose-Capillary: 219 mg/dL — ABNORMAL HIGH (ref 70–99)

## 2023-10-29 LAB — PROTIME-INR
INR: 2.3 — ABNORMAL HIGH (ref 0.8–1.2)
Prothrombin Time: 25.2 s — ABNORMAL HIGH (ref 11.4–15.2)

## 2023-10-29 LAB — PROTEIN, TOTAL: Total Protein: 5.4 g/dL — ABNORMAL LOW (ref 6.5–8.1)

## 2023-10-29 LAB — LIPASE, BLOOD: Lipase: 19 U/L (ref 11–51)

## 2023-10-29 LAB — CBG MONITORING, ED: Glucose-Capillary: 158 mg/dL — ABNORMAL HIGH (ref 70–99)

## 2023-10-29 LAB — LACTATE DEHYDROGENASE: LDH: 118 U/L (ref 98–192)

## 2023-10-29 MED ORDER — POTASSIUM CHLORIDE 10 MEQ/50ML IV SOLN
10.0000 meq | INTRAVENOUS | Status: AC
  Administered 2023-10-29 – 2023-10-30 (×4): 10 meq via INTRAVENOUS
  Filled 2023-10-29 (×4): qty 50

## 2023-10-29 MED ORDER — LACTATED RINGERS IV BOLUS (SEPSIS)
1000.0000 mL | Freq: Once | INTRAVENOUS | Status: AC
  Administered 2023-10-29: 1000 mL via INTRAVENOUS

## 2023-10-29 MED ORDER — METRONIDAZOLE 500 MG/100ML IV SOLN
500.0000 mg | Freq: Once | INTRAVENOUS | Status: AC
  Administered 2023-10-29: 500 mg via INTRAVENOUS
  Filled 2023-10-29: qty 100

## 2023-10-29 MED ORDER — INSULIN ASPART 100 UNIT/ML IJ SOLN: 2.0000 [IU] | INTRAMUSCULAR | Status: DC

## 2023-10-29 MED ORDER — NOREPINEPHRINE 4 MG/250ML-% IV SOLN
0.0000 ug/min | INTRAVENOUS | Status: DC
  Administered 2023-10-29: 10 ug/min via INTRAVENOUS
  Administered 2023-10-29 (×2): 30 ug/min via INTRAVENOUS
  Administered 2023-10-30: 27 ug/min via INTRAVENOUS
  Administered 2023-10-30: 30 ug/min via INTRAVENOUS
  Administered 2023-10-30: 24 ug/min via INTRAVENOUS
  Administered 2023-10-30: 30 ug/min via INTRAVENOUS
  Administered 2023-10-30: 6 ug/min via INTRAVENOUS
  Filled 2023-10-29 (×10): qty 250

## 2023-10-29 MED ORDER — HYDROCORTISONE SOD SUC (PF) 100 MG IJ SOLR
50.0000 mg | INTRAMUSCULAR | Status: AC
  Administered 2023-10-29: 50 mg via INTRAVENOUS
  Filled 2023-10-29: qty 2

## 2023-10-29 MED ORDER — SODIUM CHLORIDE 0.9 % IV SOLN
2.0000 g | Freq: Once | INTRAVENOUS | Status: AC
  Administered 2023-10-29: 2 g via INTRAVENOUS
  Filled 2023-10-29: qty 12.5

## 2023-10-29 MED ORDER — VANCOMYCIN HCL 1750 MG/350ML IV SOLN
1750.0000 mg | Freq: Once | INTRAVENOUS | Status: AC
  Administered 2023-10-29: 1750 mg via INTRAVENOUS
  Filled 2023-10-29: qty 350

## 2023-10-29 MED ORDER — ONDANSETRON HCL 4 MG/2ML IJ SOLN: 4.0000 mg | Freq: Four times a day (QID) | INTRAMUSCULAR | Status: DC | PRN

## 2023-10-29 MED ORDER — DEXTROSE 5 % IV SOLN
100.0000 mg | Freq: Two times a day (BID) | INTRAVENOUS | Status: DC
  Administered 2023-10-29 – 2023-10-30 (×2): 100 mg via INTRAVENOUS
  Filled 2023-10-29 (×2): qty 100

## 2023-10-29 MED ORDER — IOHEXOL 350 MG/ML SOLN
75.0000 mL | Freq: Once | INTRAVENOUS | Status: AC | PRN
  Administered 2023-10-29: 75 mL via INTRAVENOUS

## 2023-10-29 MED ORDER — SODIUM CHLORIDE 0.9 % IV BOLUS
1000.0000 mL | Freq: Once | INTRAVENOUS | Status: AC
  Administered 2023-10-29: 1000 mL via INTRAVENOUS

## 2023-10-29 MED ORDER — METRONIDAZOLE 500 MG/100ML IV SOLN
500.0000 mg | Freq: Three times a day (TID) | INTRAVENOUS | Status: DC
  Administered 2023-10-29 – 2023-10-30 (×2): 500 mg via INTRAVENOUS
  Filled 2023-10-29 (×2): qty 100

## 2023-10-29 MED ORDER — VANCOMYCIN HCL IN DEXTROSE 1-5 GM/200ML-% IV SOLN: 1000.0000 mg | Freq: Once | INTRAVENOUS | Status: DC

## 2023-10-29 MED ORDER — DEXTROSE 5 % IV SOLN
1.2500 g | Freq: Three times a day (TID) | INTRAVENOUS | Status: DC
  Administered 2023-10-29 – 2023-10-30 (×5): 1.25 g via INTRAVENOUS
  Filled 2023-10-29 (×11): qty 6

## 2023-10-29 MED ORDER — POLYETHYLENE GLYCOL 3350 17 G PO PACK
17.0000 g | PACK | Freq: Every day | ORAL | Status: DC | PRN
Start: 2023-10-29 — End: 2023-11-09

## 2023-10-29 MED ORDER — SODIUM CHLORIDE 0.9 % IV SOLN
250.0000 mL | INTRAVENOUS | Status: AC
  Administered 2023-10-30: 250 mL via INTRAVENOUS

## 2023-10-29 MED ORDER — HEPARIN SODIUM (PORCINE) 5000 UNIT/ML IJ SOLN
5000.0000 [IU] | Freq: Three times a day (TID) | INTRAMUSCULAR | Status: DC
  Administered 2023-10-29 – 2023-10-31 (×5): 5000 [IU] via SUBCUTANEOUS
  Filled 2023-10-29 (×5): qty 1

## 2023-10-29 MED ORDER — PANTOPRAZOLE SODIUM 40 MG IV SOLR
40.0000 mg | Freq: Once | INTRAVENOUS | Status: AC
  Administered 2023-10-29: 40 mg via INTRAVENOUS
  Filled 2023-10-29: qty 10

## 2023-10-29 MED ORDER — DOCUSATE SODIUM 100 MG PO CAPS: 100.0000 mg | ORAL_CAPSULE | Freq: Two times a day (BID) | ORAL | Status: DC | PRN

## 2023-10-29 MED ORDER — INSULIN ASPART 100 UNIT/ML IJ SOLN
0.0000 [IU] | INTRAMUSCULAR | Status: DC
  Administered 2023-10-29 – 2023-10-30 (×2): 3 [IU] via SUBCUTANEOUS
  Administered 2023-10-30: 7 [IU] via SUBCUTANEOUS
  Administered 2023-10-30: 2 [IU] via SUBCUTANEOUS
  Administered 2023-10-30: 3 [IU] via SUBCUTANEOUS
  Administered 2023-10-30: 7 [IU] via SUBCUTANEOUS
  Administered 2023-10-31: 5 [IU] via SUBCUTANEOUS
  Administered 2023-10-31: 2 [IU] via SUBCUTANEOUS
  Administered 2023-10-31: 5 [IU] via SUBCUTANEOUS

## 2023-10-29 MED ORDER — LACTATED RINGERS IV SOLN: INTRAVENOUS | Status: AC

## 2023-10-29 NOTE — Procedures (Signed)
 Central Venous Catheter Insertion Procedure Note  Peter Yang  [id number]  Nov 28, 1955  Date:10/29/23  Time:7:00 PM   Provider Performing:Erique Kaser Meade   Procedure: Insertion of Non-tunneled Central Venous Catheter(36556) without US  guidance  Indication(s) Difficult access  Consent Risks of the procedure as well as the alternatives and risks of each were explained to the patient and/or caregiver.  Consent for the procedure was obtained and is signed in the bedside chart  Anesthesia Topical only with 1% lidocaine    Timeout Verified patient identification, verified procedure, site/side was marked, verified correct patient position, special equipment/implants available, medications/allergies/relevant history reviewed, required imaging and test results available.  Sterile Technique Maximal sterile technique including full sterile barrier drape, hand hygiene, sterile gown, sterile gloves, mask, hair covering, sterile ultrasound probe cover (if used).  Procedure Description Area of catheter insertion was cleaned with chlorhexidine  and draped in sterile fashion.  Without real-time ultrasound guidance a central venous catheter was placed into the left subclavian vein. Nonpulsatile blood flow and easy flushing noted in all ports.  The catheter was sutured in place and sterile dressing applied.  Complications/Tolerance None; patient tolerated the procedure well. Chest X-ray is ordered to verify placement for internal jugular or subclavian cannulation.   Chest x-ray is not ordered for femoral cannulation.  EBL Minimal  Specimen(s) None   Sammi Meade, PA - [facility] For pager details, please see AMION or use Epic chat  After 1900, please call [facility] for cross coverage needs 10/29/2023, 7:01 PM

## 2023-10-29 NOTE — ED Notes (Signed)
 RN exchanged foley catheter for clean urine sample. Temperature foley cath placed at this time.

## 2023-10-29 NOTE — H&P (Signed)
 NAME:  Peter Yang, MRN:  [id number], DOB:  1955-11-28, LOS: 0 ADMISSION DATE:  10/29/2023, CONSULTATION DATE:  10/29/23 REFERRING MD:  Dr. Yolande, CHIEF COMPLAINT:  AMS   History of Present Illness:  Peter Yang is a 68 y/o gentleman with a history of debility requiring SNF/ rehab since BKA for necrotizing fasciitis, Fournier's gangrene, DM, chronic hypotension, ureterocutaneous fistula who presented with confusion, feeling hot, and hypotension from [place]. He has been treated for several days with ciprofloxacin for UTI. He had gross hematuria and had his foley changed in the ED. He was initially confused, but was started on an epi infusion by EMS with improvement in his mentation. In the ED he became more confused. He is currently unarousable and unable to answer questions or provide additional history. Per his emergency contact, he has always been able to make his own decisions. In the ED he had 1/2 blood cultures obtained before antibiotics due to difficult access. He was started on NE at 30mcg via PIV.    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 Overview:  Added automatically from request for surgery [id number]  Formatting of this note might be different from the original. Added automatically from request for surgery [id number]   Large bowel perforation (HCC) 09/27/2015   Malignant neoplasm of prostate (HCC) 10/22/2019   Myopia with astigmatism and presbyopia, bilateral 06/15/2018   Last Assessment & Plan:  Formatting of this note might be different from the original. - Wrx printed Formatting of this note might be different from the original. 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 - Stable, monitor Formatting of this note might be different from the original. Last Assessment & Plan:  - Stable, monitor   Retroperitoneal abscess (HCC) 10/02/2015   Status post corneal transplant 10/07/2016   Formatting of this note might be different from the original. Overview:  Added automatically from request for surgery [id number] Formatting of this note might be different from the original. Added automatically from request for surgery [id number]  Last Assessment & Plan:  Formatting of this note might be different from the original. - OD, 2/2 KCN - Stable, no NVK / rejection - Continue off steroids - Mo   Thoracic ascending aortic aneurysm (HCC)    TIA (transient ischemic attack) 2012   Type 2 diabetes mellitus (HCC)    Significant Hospital Events: Including procedures, antibiotic start and stop dates in  addition to other pertinent events   1/8 admission, given cefepime , flagyl , vanc in ED. Foley changed in ED. On peripiheral NE.   Interim History / Subjective:    Objective   Blood pressure 100/63, pulse 75, temperature 98.8 F (37.1 C), resp. rate 14, height 6' (1.829 m), weight 79.4 kg, SpO2  99%.       No intake or output data in the 24 hours ending 10/29/23 1612 Filed Weights   10/29/23 1408  Weight: 79.4 kg   Examination: General:  ill appearing elderly man lying in bed obtunded HEENT: [name], eyes anicteric. Dried blood on teeth, oral mucosa & lips.  Neuro: unresponsive, reactive pupils CV: S1S2, irreg rhythm PULM:  central apneas, decreased R basilar breath sounds. No rhonchi. GI: soft, NT Extremities: L BKA, no significant edema Skin: scabbed excoriations, no diffuse rashes. Warm, dry.   Bedside US : simple appearing pleural effusion on the R  CXR: R effusion, possibly also infiltrate Covid, flu, RSV negative BUN 59 Cr 1.73 T bili 1.3 WBC 33.6 H/H 10.5/32.4 Platelets 227 INR 2.3 UA: too bloody to check WBC   Resolved Hospital Problem list     Assessment & Plan:  Septic shock; suspect urinary or pulmonary source. Initial LA normal. -respiratory viral panel pending -empiric broad antibiotics started, consulting ID for MDR pseudomonas in the past. 1 dose of gentamicin in the interim to attempt source control -thora- diagnostic and therapeutic; need to ensure there is not an uncontrolled source -Needs CVC -con't vasopressors PRN to maintain MAP >65 -follow blood cultures -recheck LA -stress dose steroids  Gross hematuria Chronic foley -foley changed in ED -follow urine culture -urology consulted regarding potential need for CBI -hold DOAC  Acute metabolic encephalopathy due to sepsis; has waxed and waned -head CT since he is chronically on DOAC; hold DOAC for now -avoid deliriogenic meds -maintain adequate perfusion  AKI, pre-renal vs septic ATN -volume resuscitation -con't IVF; received 2L crystalloid bolused -Ct abd ordered -strict I/O -renally dose meds, avoid nephrotoxic meds  Chronic hypotension -resume PTA midodrine   Hypokalemia -repleted  Hyperbilirubinemia -monitor -abd CT to evaluate GB  Anemia- unsure how far off recent  baseline he is -hold DOAC, ok for chemical DVT prophylaxis for now  GOC -DNR form at bedside, confirmed by s/o [name]. She consented for procedures. Reviewed 03/2023 Palliative notes- poor QOL but he didn't want to stop aggressive care. Prognosis was identified to be poor during that admission.   Best Practice (right click and Reselect all SmartList Selections daily)   Diet/type: NPO DVT prophylaxis prophylactic heparin   Pressure ulcer(s): pressure ulcer assessment deferred  GI prophylaxis: N/A Lines: Central line Foley:  Yes, and it is still needed Code Status:  DNR Last date of multidisciplinary goals of care discussion [1/8 with POA [name] Smith]  Labs   CBC: Recent Labs  Lab 10/29/23 1457  WBC 33.6*  NEUTROABS 31.9*  HGB 10.5*  HCT 32.4*  MCV 91.0  PLT 227    Basic Metabolic Panel: Recent Labs  Lab 10/29/23 1457  NA 136  K 3.4*  CL 100  CO2 24  GLUCOSE 100*  BUN 59*  CREATININE 1.73*  CALCIUM  8.0*   GFR: Estimated Creatinine Clearance: 46.1 mL/min (A) (by C-G formula based on SCr of 1.73 mg/dL (H)). Recent Labs  Lab 10/29/23 1457 10/29/23 1504  WBC 33.6*  --   LATICACIDVEN  --  1.9    Liver Function Tests: Recent Labs  Lab 10/29/23 1457  AST 18  ALT 10  ALKPHOS 99  BILITOT 1.3*  PROT 5.7*  ALBUMIN  2.1*   Recent Labs  Lab 10/29/23 1457  LIPASE 19   No results for input(s): AMMONIA in the last 168 hours.  ABG    Component Value Date/Time   HCO3 15.9 (L) 06/25/2022 1706   TCO2 23 07/28/2023 1631   ACIDBASEDEF 8.0 (H) 06/25/2022 1706   O2SAT 62.8 09/15/2022 0620     Coagulation Profile: Recent Labs  Lab 10/29/23 1457  INR 2.3*    Cardiac Enzymes: No results for input(s): CKTOTAL, CKMB, CKMBINDEX, TROPONINI in the last 168 hours.  HbA1C: Hgb A1c MFr Bld  Date/Time Value Ref Range Status  04/02/2023 03:18 AM 7.0 (H) 4.8 - 5.6 % Final    Comment:    (NOTE) Pre diabetes:          5.7%-6.4%  Diabetes:               >6.4%  Glycemic control for   <7.0% adults with diabetes   08/03/2022 04:11 AM 6.6 (H) 4.8 - 5.6 % Final    Comment:    (NOTE) Pre diabetes:          5.7%-6.4%  Diabetes:              >6.4%  Glycemic control for   <7.0% adults with diabetes     CBG: No results for input(s): GLUCAP in the last 168 hours.  Review of Systems:   Unable to be obtained due to mental status .  Past Medical History:  He,  has a past medical history of BPH with obstruction/lower urinary tract symptoms (02/02/2018), CAD (coronary artery disease), Congenital talipes calcaneovarus, 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), Coronary artery disease involving native coronary artery of native heart without angina pectoris (06/25/2022), Diabetic polyneuropathy associated with type 2 diabetes mellitus (HCC) (07/28/2021), Essential hypertension (01/25/2016), Hematuria and +fecal occult  (06/25/2022), History of diabetic ulcer of foot (12/31/2019), History of non-ST elevation myocardial infarction (NSTEMI) (06/15/2022), right coronary artery stent placement (02/11/2022), Hyperlipidemia, Ischemic cardiomyopathy, 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), Large bowel perforation (HCC) (09/27/2015), Malignant neoplasm of prostate (HCC) (10/22/2019), Myopia with astigmatism and presbyopia, bilateral (06/15/2018), Nontraumatic complete tear of left rotator cuff (09/20/2019), 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   Surgical History:   Past Surgical History:  Procedure Laterality Date   BELOW KNEE LEG AMPUTATION Left 2022   CORONARY ANGIOPLASTY WITH STENT PLACEMENT  01/2022   DES RCA   CYSTOSCOPY  N/A 06/08/2023   Procedure: CYSTOSCOPY;  Surgeon: Watt Rush, MD;  Location: [facility] OR;  Service: Urology;  Laterality: N/A;   IRRIGATION AND DEBRIDEMENT ABSCESS N/A 04/02/2023   Procedure: IRRIGATION AND DEBRIDEMENT ABSCESS;  Surgeon: Cam Morene ORN, MD;  Location: [facility] OR;  Service: Urology;  Laterality: N/A;   IRRIGATION AND DEBRIDEMENT ABSCESS N/A 04/09/2023   Procedure: SCROTAL WASHOUT AND DEBRIDEMENT WITH WOUND CLOSURE;  Surgeon: Cam Morene ORN, MD;  Location: [facility] OR;  Service: Urology;  Laterality: N/A;  60 MINUTES   LEFT HEART CATH AND CORONARY ANGIOGRAPHY N/A 06/17/2022   Procedure: LEFT HEART CATH AND CORONARY ANGIOGRAPHY;  Surgeon: Mady Bruckner, MD;  Location: [facility] INVASIVE CV LAB;  Service: Cardiovascular;  Laterality: N/A;   ROTATOR CUFF REPAIR Left  SCROTAL EXPLORATION N/A 06/08/2023   Procedure: SCROTUM EXPLORATION;  Surgeon: Watt Rush, MD;  Location: [facility] OR;  Service: Urology;  Laterality: N/A;     Social History:   reports that he has never smoked. He has never used smokeless tobacco. He reports that he does not drink alcohol  and does not use drugs.   Family History:  His family history includes Cancer in his father and sister; Diabetes in his brother and mother; Heart attack in his sister; Heart disease in his mother; Hypertension in his mother.   Allergies Allergies  Allergen Reactions   Sglt2 Inhibitors Other (See Comments)    Fournier's gangrene     Home Medications  Prior to Admission medications   Medication Sig Start Date End Date Taking? Authorizing Provider  acetaminophen  (TYLENOL ) 500 MG tablet Take 1,000 mg by mouth every 8 (eight) hours as needed for moderate pain (pain score 4-6).   Yes [provider]  apixaban  (ELIQUIS ) 5 MG TABS tablet Take 1 tablet (5 mg total) by mouth 2 (two) times daily. 06/23/23 10/29/23 Yes Gherghe, Costin M, MD  ascorbic acid  (VITAMIN C ) 500 MG tablet Take 500 mg by mouth 2 (two) times daily.   Yes [provider]  aspirin  81 MG chewable tablet Chew 81 mg by mouth daily.   Yes [provider]  atorvastatin  (LIPITOR ) 40 MG tablet Take 40 mg by mouth at bedtime.   Yes [provider]  ciprofloxacin (CIPRO) 500 MG tablet Take 500 mg by mouth 2 (two) times daily. 10/23/23  Yes [provider]  diclofenac  Sodium (VOLTAREN ) 1 % GEL Apply 2 g topically See admin instructions. Apply to bilateral knees topically once time a day for pain. Apply 2 g to affected area 3 times daily 07/20/21  Yes [provider]  docusate sodium  (COLACE) 100 MG capsule Take 1 capsule (100 mg total) by mouth 2 (two) times daily. 04/18/23  Yes Vann, Jessica U, DO  escitalopram  (LEXAPRO ) 10 MG tablet Take 15 mg by mouth daily.   Yes [provider]  ferrous sulfate  325 (65 FE) MG tablet Take 1 tablet (325 mg total) by mouth daily with breakfast. 10/25/22  Yes Kc, Ramesh, MD  finasteride  (PROSCAR ) 5 MG tablet Take 5 mg by mouth daily.   Yes [provider]  folic acid  (FOLVITE ) 1 MG tablet Take 1 tablet (1 mg total) by mouth daily. 05/14/23  Yes Dennise Lavada POUR, MD  furosemide  (LASIX ) 40 MG tablet Take 1 tablet (40 mg total) by mouth daily. 06/19/23  Yes Gherghe, Costin M, MD  gabapentin  (NEURONTIN ) 300 MG capsule Take 1 capsule (300 mg total) by mouth at bedtime. Patient taking differently: Take 300 mg by mouth daily. 04/18/23  Yes Vann, Jessica U, DO  HUMALOG KWIKPEN 100 UNIT/ML KwikPen Inject 0-15 Units into the skin See admin instructions. Inject 0-15 units three times daily before meals per sliding scale: BS < 60 call MD 0 - 100 : 0 units 101-150 : 3 units 151-200 : 4 units 201-250 : 7 units 251-300 : 9 units 301-350 : 12 units 351-400 : 15 units  BS > 400 call MD 04/22/23  Yes [provider]  hydrocortisone  (CORTEF ) 5 MG tablet Take 1 tablet (5 mg total) by mouth 2 (two) times daily. 04/18/23  Yes Vann, Jessica U, DO  insulin  glargine-yfgn (SEMGLEE ) 100 UNIT/ML injection  Inject 0.2 mLs (20 Units total) into the skin at bedtime. 04/18/23  Yes Vann, Jessica U, DO  midodrine  (  PROAMATINE ) 10 MG tablet Take 1 tablet (10 mg total) by mouth with breakfast, with lunch, and with evening meal. 04/18/23  Yes Vann, Jessica U, DO  nystatin  (MYCOSTATIN /NYSTOP ) powder Apply topically 3 (three) times daily. Patient taking differently: Apply 1 Application topically 2 (two) times daily. 05/13/23  Yes Dennise Lavada POUR, MD  oxyCODONE  (OXYCONTIN ) 15 mg 12 hr tablet Take 1 tablet (15 mg total) by mouth every 12 (twelve) hours. 06/19/23  Yes Gherghe, Costin M, MD  pantoprazole  (PROTONIX ) 40 MG tablet Take 1 tablet (40 mg total) by mouth daily. 06/30/22  Yes Pokhrel, Laxman, MD  saccharomyces boulardii (FLORASTOR) 250 MG capsule Take 250 mg by mouth 2 (two) times daily.   Yes [provider]  tamsulosin  (FLOMAX ) 0.4 MG CAPS capsule Take 0.4 mg by mouth daily. 07/29/22  Yes [provider]  traZODone  (DESYREL ) 50 MG tablet Take 150 mg by mouth at bedtime.   Yes [provider]  TRESIBA  FLEXTOUCH 100 UNIT/ML FlexTouch Pen Inject 23 Units into the skin at bedtime. 10/23/23  Yes [provider]  albuterol  (PROVENTIL ) (2.5 MG/3ML) 0.083% nebulizer solution Take 2.5 mg by nebulization every 4 (four) hours as needed for wheezing or shortness of breath.    [provider]  albuterol  (VENTOLIN  HFA) 108 (90 Base) MCG/ACT inhaler Inhale 2 puffs into the lungs every 4 (four) hours as needed for wheezing or shortness of breath.    [provider]  Amino Acids -Protein Hydrolys (FEEDING SUPPLEMENT, PRO-STAT SUGAR FREE 64,) LIQD Take 30 mLs by mouth daily.    [provider]  bisacodyl  (DULCOLAX) 10 MG suppository Place 1 suppository (10 mg total) rectally daily as needed for moderate constipation. 10/24/22   Christobal Guadalajara, MD  feeding supplement, GLUCERNA SHAKE, (GLUCERNA SHAKE) LIQD Take 237 mLs by mouth 3 (three) times daily between meals. 06/19/23    Gherghe, Costin M, MD  leptospermum manuka honey (MEDIHONEY) PSTE paste Apply 1 Application topically daily. 06/20/23   Gherghe, Costin M, MD  melatonin 5 MG TABS Take 1 tablet (5 mg total) by mouth daily at 6 PM. Patient taking differently: Take 5 mg by mouth at bedtime. 10/24/22   Christobal Guadalajara, MD  Multiple Vitamin (MULTIVITAMIN WITH MINERALS) TABS tablet Take 1 tablet by mouth daily. 06/20/23   Gherghe, Costin M, MD  oxycodone  (OXY-IR) 5 MG capsule Take 1 capsule (5 mg total) by mouth every 4 (four) hours as needed (moderate to severe pain). 06/19/23   Gherghe, Costin M, MD  senna-docusate (SENOKOT-S) 8.6-50 MG tablet Take 1 tablet by mouth 2 (two) times daily. 10/24/22   Christobal Guadalajara, MD  vitamin A  3 MG (10000 UNITS) capsule Take 1 capsule (10,000 Units total) by mouth daily. 06/20/23   Gherghe, Costin M, MD  zinc  sulfate 220 (50 Zn) MG capsule Take 1 capsule (220 mg total) by mouth daily. 06/20/23   Trixie Nilda HERO, MD     Critical care time: 47 min.     [name], DO 10/29/23 5:39 PM [facility]  For contact information, see Amion. If no response to pager, please call PCCM consult pager. After hours, 7PM- 7AM, please call Elink.

## 2023-10-29 NOTE — ED Provider Notes (Signed)
  Physical Exam  BP (!) 93/57   Pulse 65   Temp (!) 97.3 F (36.3 C)   Resp (!) 26   Ht 6' (1.829 m)   Wt 90.3 kg   SpO2 100%   BMI 27.00 kg/m   Physical Exam  Procedures  Procedures  ED Course / MDM   Clinical Course as of 10/31/23 1403  Wed Oct 29, 2023  1525 Assumed care.  68 year old male with history of CAD status post PCI, large bowel perforation, CVA, DKA, CHF (EF 35%), adrenal insufficiency on chronic steroids, and Fournier's gangrene who came in from SNF for AMS was hypotensvie to the 50s when EMS got there. Now on levophed . Has gotten 1L of fluids from EMS and 1L from us . May have dried blood in his mouth. Has not been vomiting. Foley with gross hematuria. Awake and tracks but not talking or following commands. Makes medical decisions for himself. DNR but no other papers. No next of kin. On eliquis .  [RP]  1624 Spoke with Jeoffrey Sharps from palliative care.  They will attempt to have someone see the patient tomorrow.  Also spoke with Lyle from critical care who will evaluate the patient shortly.  He remains on Levophed .  Did discuss placing a central line in the emergency department with the ICU team shows that the will be happy to do so when he gets up to the unit and that he should have a bed shortly.  Patient is awaiting CT of his head and abdomen on the way to the ICU. [RP]    Clinical Course User Index [RP] Yolande Lamar BROCKS, MD   Medical Decision Making Amount and/or Complexity of Data Reviewed Labs: ordered. Radiology: ordered.  Risk Prescription drug management. Decision regarding hospitalization.     Yolande Lamar BROCKS, MD 10/31/23 [phone]

## 2023-10-29 NOTE — ED Provider Notes (Signed)
 [facility] Provider Note   CSN: [id number] Arrival date & time: 10/29/23  1355     History  Chief Complaint  Patient presents with   Altered Mental Status    Decreasing LOC since am     Peter Yang is a 68 y.o. male.  Patient is a 68 year old male with a past medical history of A-fib on Eliquis , CHF, hypertension, diabetes, left BKA, prior necrotizing fasciitis, chronic Foley catheter placement presenting to the emergency department with altered mental status.  Per EMS, the nursing home called this morning for altered mental status stating that he is more lethargic from his baseline.  Normally is ANO x 3 and makes medical decisions for himself.  EMS reports on their arrival the patient was hypotensive initially to the 50s systolic and they started him on epinephrine drip.  They state that he had some improvement of his mental status with the epinephrine and was able to tell them his name and declined any pain.  The nursing home also reported cloudy appearing urine though EMS noted gross hematuria in his catheter bag.  Patient was also found to have what appeared to be dried blood or vomitus in his mouth though no reported vomiting from nursing staff.  Patient does have DNR paperwork with him.  Has no next of kin, has girlfriend on his paperwork.  The history is provided by the EMS personnel. The history is limited by the condition of the patient.  Altered Mental Status      Home Medications Prior to Admission medications   Medication Sig Start Date End Date Taking? Authorizing Provider  apixaban  (ELIQUIS ) 5 MG TABS tablet Take 1 tablet (5 mg total) by mouth 2 (two) times daily. 06/23/23 10/29/23 Yes Gherghe, Nilda HERO, MD  aspirin  81 MG chewable tablet Chew 81 mg by mouth daily.   Yes [provider]  atorvastatin  (LIPITOR ) 40 MG tablet Take 40 mg by mouth at bedtime.   Yes [provider]  ciprofloxacin (CIPRO) 500 MG  tablet Take 500 mg by mouth 2 (two) times daily. 10/23/23  Yes [provider]  diclofenac  Sodium (VOLTAREN ) 1 % GEL Apply 2 g topically See admin instructions. Apply to bilateral knees topically once time a day for pain. Apply 2 g to affected area 3 times daily 07/20/21  Yes [provider]  escitalopram  (LEXAPRO ) 10 MG tablet Take 15 mg by mouth daily.   Yes [provider]  ferrous sulfate  325 (65 FE) MG tablet Take 1 tablet (325 mg total) by mouth daily with breakfast. 10/25/22  Yes Kc, Ramesh, MD  finasteride  (PROSCAR ) 5 MG tablet Take 5 mg by mouth daily.   Yes [provider]  folic acid  (FOLVITE ) 1 MG tablet Take 1 tablet (1 mg total) by mouth daily. 05/14/23  Yes Dennise Lavada POUR, MD  furosemide  (LASIX ) 40 MG tablet Take 1 tablet (40 mg total) by mouth daily. 06/19/23  Yes Gherghe, Costin M, MD  gabapentin  (NEURONTIN ) 300 MG capsule Take 1 capsule (300 mg total) by mouth at bedtime. Patient taking differently: Take 300 mg by mouth daily. 04/18/23  Yes Vann, Jessica U, DO  insulin  glargine-yfgn (SEMGLEE ) 100 UNIT/ML injection Inject 0.2 mLs (20 Units total) into the skin at bedtime. 04/18/23  Yes Vann, Jessica U, DO  pantoprazole  (PROTONIX ) 40 MG tablet Take 1 tablet (40 mg total) by mouth daily. 06/30/22  Yes Pokhrel, Laxman, MD  tamsulosin  (FLOMAX ) 0.4 MG CAPS capsule Take  0.4 mg by mouth daily. 07/29/22  Yes [provider]  traZODone  (DESYREL ) 50 MG tablet Take 150 mg by mouth at bedtime.   Yes [provider]  TRESIBA  FLEXTOUCH 100 UNIT/ML FlexTouch Pen Inject 23 Units into the skin at bedtime. 10/23/23  Yes [provider]  albuterol  (PROVENTIL ) (2.5 MG/3ML) 0.083% nebulizer solution Take 2.5 mg by nebulization every 4 (four) hours as needed for wheezing or shortness of breath.    [provider]  albuterol  (VENTOLIN  HFA) 108 (90 Base) MCG/ACT inhaler Inhale 2 puffs into the lungs every 4 (four) hours as needed for wheezing or  shortness of breath.    [provider]  Amino Acids -Protein Hydrolys (FEEDING SUPPLEMENT, PRO-STAT SUGAR FREE 64,) LIQD Take 30 mLs by mouth daily.    [provider]  B Complex-C (B COMPLEX-VITAMIN C  PO) Take 1 tablet by mouth daily. B complex/Vitamin C /Folic acid     [provider]  bisacodyl  (DULCOLAX) 10 MG suppository Place 1 suppository (10 mg total) rectally daily as needed for moderate constipation. 10/24/22   Christobal Guadalajara, MD  docusate sodium  (COLACE) 100 MG capsule Take 1 capsule (100 mg total) by mouth 2 (two) times daily. 04/18/23   Vann, Jessica U, DO  feeding supplement, GLUCERNA SHAKE, (GLUCERNA SHAKE) LIQD Take 237 mLs by mouth 3 (three) times daily between meals. 06/19/23   Gherghe, Costin M, MD  HUMALOG KWIKPEN 100 UNIT/ML KwikPen Inject 0-15 Units into the skin See admin instructions. Inject 0-15 units three times daily before meals per sliding scale: BS < 60 call MD 0 - 100 : 0 units 101-150 : 3 units 151-200 : 4 units 201-250 : 7 units 251-300 : 9 units 301-350 : 12 units 351-400 : 15 units  BS > 400 call MD 04/22/23   [provider]  hydrocortisone  (CORTEF ) 5 MG tablet Take 1 tablet (5 mg total) by mouth 2 (two) times daily. 04/18/23   Vann, Jessica U, DO  leptospermum manuka honey (MEDIHONEY) PSTE paste Apply 1 Application topically daily. 06/20/23   Gherghe, Costin M, MD  melatonin 5 MG TABS Take 1 tablet (5 mg total) by mouth daily at 6 PM. Patient taking differently: Take 5 mg by mouth at bedtime. 10/24/22   Christobal Guadalajara, MD  midodrine  (PROAMATINE ) 10 MG tablet Take 1 tablet (10 mg total) by mouth with breakfast, with lunch, and with evening meal. 04/18/23   Juvenal Harlene PENNER, DO  Multiple Vitamin (MULTIVITAMIN WITH MINERALS) TABS tablet Take 1 tablet by mouth daily. 06/20/23   Gherghe, Costin M, MD  nystatin  (MYCOSTATIN /NYSTOP ) powder Apply topically 3 (three) times daily. Patient taking differently: Apply 1 Application topically 2 (two) times  daily. 05/13/23   Singh, Prashant K, MD  oxycodone  (OXY-IR) 5 MG capsule Take 1 capsule (5 mg total) by mouth every 4 (four) hours as needed (moderate to severe pain). 06/19/23   Gherghe, Costin M, MD  oxyCODONE  (OXYCONTIN ) 15 mg 12 hr tablet Take 1 tablet (15 mg total) by mouth every 12 (twelve) hours. 06/19/23   Gherghe, Costin M, MD  saccharomyces boulardii (FLORASTOR) 250 MG capsule Take 250 mg by mouth 2 (two) times daily.    [provider]  senna-docusate (SENOKOT-S) 8.6-50 MG tablet Take 1 tablet by mouth 2 (two) times daily. 10/24/22   Christobal Guadalajara, MD  vitamin A  3 MG (10000 UNITS) capsule Take 1 capsule (10,000 Units total) by mouth daily. 06/20/23   Gherghe, Costin M, MD  zinc  sulfate 220 (50 Zn) MG  capsule Take 1 capsule (220 mg total) by mouth daily. 06/20/23   Gherghe, Costin M, MD      Allergies    Sglt2 inhibitors    Review of Systems   Review of Systems  Physical Exam Updated Vital Signs BP 91/63   Pulse 79   Temp 98.8 F (37.1 C) (Rectal)   Resp 14   Ht 6' (1.829 m)   Wt 79.4 kg   SpO2 100%   BMI 23.73 kg/m  Physical Exam Vitals and nursing note reviewed. Exam conducted with a chaperone present.  Constitutional:      Appearance: He is ill-appearing.  HENT:     Head: Normocephalic and atraumatic.     Nose: Nose normal.     Mouth/Throat:     Mouth: Mucous membranes are moist.     Comments: Dried dark appearing vomitus versus blood within the mouth Eyes:     Extraocular Movements: Extraocular movements intact.     Conjunctiva/sclera: Conjunctivae normal.     Pupils: Pupils are equal, round, and reactive to light.  Cardiovascular:     Rate and Rhythm: Normal rate and regular rhythm.     Heart sounds: Normal heart sounds.  Pulmonary:     Effort: Pulmonary effort is normal.     Breath sounds: Normal breath sounds.  Abdominal:     General: Abdomen is flat.     Palpations: Abdomen is soft.     Tenderness: There is no abdominal tenderness.  Genitourinary:     Penis: Normal.      Testes: Normal.     Comments: No evidence of necrotizing fasciitis or other infection in the groin, Foley catheter in place with gross blood  Musculoskeletal:     Cervical back: Normal range of motion.     Comments: Left lower extremity BKA  Skin:    General: Skin is warm and dry.  Neurological:     Mental Status: He is alert.     Comments: Oriented x 0  Awake with eyes open, making eye contact but not answering any questions      ED Results / Procedures / Treatments   Labs (all labs ordered are listed, but only abnormal results are displayed) Labs Reviewed  CBC WITH DIFFERENTIAL/PLATELET - Abnormal; Notable for the following components:      Result Value   WBC 33.6 (*)    RBC 3.56 (*)    Hemoglobin 10.5 (*)    HCT 32.4 (*)    RDW 18.1 (*)    All other components within normal limits  PROTIME-INR - Abnormal; Notable for the following components:   Prothrombin  Time 25.2 (*)    INR 2.3 (*)    All other components within normal limits  APTT - Abnormal; Notable for the following components:   aPTT 37 (*)    All other components within normal limits  RESP PANEL BY RT-PCR (RSV, FLU A&B, COVID)  [id number]  CULTURE, BLOOD (ROUTINE X 2)  CULTURE, BLOOD (ROUTINE X 2)  COMPREHENSIVE METABOLIC PANEL  LIPASE, BLOOD  URINALYSIS, W/ REFLEX TO CULTURE (INFECTION SUSPECTED)  I-STAT CG4 LACTIC ACID, ED  I-STAT CG4 LACTIC ACID, ED    EKG EKG Interpretation Date/Time:  Wednesday October 29 2023 14:00:53 EST Ventricular Rate:  72 PR Interval:  59 QRS Duration:  150 QT Interval:  465 QTC Calculation: 509 R Axis:   178  Text Interpretation: Sinus rhythm Short PR interval RBBB and LPFB Confirmed by Ellouise Fine ([id number]) on 10/29/2023 2:10:46 PM  Radiology DG Chest Port 1 View Result Date: 10/29/2023 CLINICAL DATA:  Sepsis.  Evaluate for abnormality. EXAM: PORTABLE CHEST 1 VIEW COMPARISON:  06/11/2023. FINDINGS: Low lung volume. There is at least small right  pleural effusion with probable associated compressive atelectatic changes and/or consolidation in the right lung base. There is also left retrocardiac airspace opacity obscuring the left hemidiaphragm, descending thoracic aorta and blunting the left lateral costophrenic angle, suggesting combination of left lung atelectasis and/or consolidation with pleural effusion. Bilateral lung fields are clear. No pneumothorax. Stable cardio-mediastinal silhouette. No acute osseous abnormalities. The soft tissues are within normal limits. IMPRESSION: Small right pleural effusion probable bilateral small pleural effusions with associated compressive atelectatic changes and/or consolidation. Correlate clinically. Electronically Signed   By: Ree Molt M.D.   On: 10/29/2023 15:27    Procedures .Critical Care  Performed by: Kingsley, Oris Calmes K, DO Authorized by: Ellouise Richerd POUR, DO   Critical care provider statement:    Critical care time (minutes):  30   Critical care was necessary to treat or prevent imminent or life-threatening deterioration of the following conditions:  Sepsis   Critical care was time spent personally by me on the following activities:  Development of treatment plan with patient or surrogate, discussions with consultants, evaluation of patient's response to treatment, examination of patient, ordering and review of laboratory studies, ordering and review of radiographic studies, ordering and performing treatments and interventions, pulse oximetry, re-evaluation of patient's condition and review of old charts .Ultrasound ED Peripheral IV (Provider)  Date/Time: 10/29/2023 3:15 PM  Performed by: Ellouise Richerd POUR, DO Authorized by: Ellouise Richerd POUR, DO   Procedure details:    Indications: hypotension and multiple failed IV attempts     Skin Prep: chlorhexidine  gluconate     Location:  Left anterior forearm   Angiocath:  20 G   Bedside Ultrasound Guided: Yes     Images: not  archived     Patient tolerated procedure without complications: Yes     Dressing applied: Yes       Medications Ordered in ED Medications  norepinephrine  (LEVOPHED ) 4mg  in (0.016 mg/mL) premix infusion (10 mcg/min Intravenous New Bag/Given 10/29/23 1409)  lactated ringers  infusion (has no administration in time range)  metroNIDAZOLE  (FLAGYL ) IVPB 500 mg (500 mg Intravenous New Bag/Given 10/29/23 1517)  0.9 %  sodium chloride  infusion (250 mLs Intravenous Not Given 10/29/23 1514)  pantoprazole  (PROTONIX ) injection 40 mg (has no administration in time range)  vancomycin  (VANCOREADY) IVPB 1750 mg/350 mL (has no administration in time range)  lactated ringers  bolus 1,000 mL (1,000 mLs Intravenous New Bag/Given 10/29/23 1446)  ceFEPIme  (MAXIPIME ) 2 g in sodium chloride  0.9 % 100 mL IVPB (2 g Intravenous New Bag/Given 10/29/23 1514)  hydrocortisone  sodium succinate  (SOLU-CORTEF ) 100 MG injection 50 mg (50 mg Intravenous Given 10/29/23 1549)    ED Course/ Medical Decision Making/ A&P Clinical Course as of 10/29/23 1551  Wed Oct 29, 2023  1525 Assumed care.  68 year old male with history of CAD status post PCI, large bowel perforation, CVA, DKA, CHF (EF 35%), adrenal insufficiency on chronic steroids, and Fournier's gangrene who came in from SNF for AMS was hypotensvie to the 50s when EMS got there. Now on levophed . Has gotten 1L of fluids from EMS and 1L from us . May have dried blood in his mouth. Has not been vomiting. Foley with gross hematuria. Awake and tracks but not talking or following commands. Makes medical decisions for himself. DNR but no other papers. No  next of kin. On eliquis .  [RP]    Clinical Course User Index [RP] Yolande Lamar BROCKS, MD                                 Medical Decision Making This patient presents to the ED with chief complaint(s) of AMS with pertinent past medical history of A-fib on Eliquis , hypertension, diabetes, CHF, BKA, prior necrotizing fasciitis, chronic  Foley catheter which further complicates the presenting complaint. The complaint involves an extensive differential diagnosis and also carries with it a high risk of complications and morbidity.    The differential diagnosis includes sepsis, dehydration, electrolyte abnormality, GI bleed, anemia, pneumonia, UTI, coagulopathy  Additional history obtained: Additional history obtained from EMS  Records reviewed Nursing Home Documents and outpatient ID records  ED Course and Reassessment: On patient's arrival he was hypotensive on epi drip and ill-appearing.  Blood pressure off the drip was in the 60s systolic and he was transition to Levophed  drip.  Patient received about 500 cc of fluid and route by EMS and will complete his bolus by EMS and will be given additional fluid bolus here.  He will have workup performed to evaluate for sepsis or other etiology of his altered mental status.  EKG on arrival had no acute ischemic changes.  Patient will be closely reassessed.  Independent labs interpretation:  The following labs were independently interpreted: leukocytosis, anemia approximately at baseline, labs otherwise pending  Independent visualization of imaging: - I independently visualized the following imaging with scope of interpretation limited to determining acute life threatening conditions related to emergency care: CXR, which revealed bilateral pleural effusions    Amount and/or Complexity of Data Reviewed Labs: ordered. Radiology: ordered.  Risk Prescription drug management.          Final Clinical Impression(s) / ED Diagnoses Final diagnoses:  None    Rx / DC Orders ED Discharge Orders     None         Kingsley, Jeyden Coffelt K, DO 10/29/23 1551

## 2023-10-29 NOTE — Procedures (Signed)
 Thoracentesis  Procedure Note  Peter Yang  [id number]  Nov 05, 1955  Date:10/29/23  Time:7:01 PM   Provider Performing:Arnitra Sokoloski Meade   Procedure: Thoracentesis with imaging guidance (67444)  Indication(s) Pleural Effusion  Consent Risks of the procedure as well as the alternatives and risks of each were explained to the patient and/or caregiver.  Consent for the procedure was obtained and is signed in the bedside chart  Anesthesia Topical only with 1% lidocaine     Time Out Verified patient identification, verified procedure, site/side was marked, verified correct patient position, special equipment/implants available, medications/allergies/relevant history reviewed, required imaging and test results available.   Sterile Technique Maximal sterile technique including full sterile barrier drape, hand hygiene, sterile gown, sterile gloves, mask, hair covering, sterile ultrasound probe cover (if used).  Procedure Description Ultrasound was used to identify appropriate pleural anatomy for placement and overlying skin marked.  Area of drainage cleaned and draped in sterile fashion. Lidocaine  was used to anesthetize the skin and subcutaneous tissue.  1500 cc's of yellow appearing fluid was drained from the right pleural space. Catheter then removed and bandaid applied to site.   Complications/Tolerance None; patient tolerated the procedure well. Chest X-ray is ordered to confirm no post-procedural complication.   EBL Minimal   Specimen(s) Pleural fluid       Sammi Meade, PA - [facility] For pager details, please see AMION or use Epic chat  After 1900, please call ELINK for cross coverage needs 10/29/2023, 7:02 PM

## 2023-10-29 NOTE — Progress Notes (Addendum)
 Attempted to call s/o Peter Yang twice, left message. No answer. Unfortunately Peter Yang is unable to make his own decisions as she had previously stated that he had always made his own.   [name], DO 10/29/23 4:42 PM [facility]  For contact information, see Amion. If no response to pager, please call PCCM consult pager. After hours, 7PM- 7AM, please call Elink.    I called Peter again and was able to speak to her. We reviewed his care and that he cannot make decisions for himself due to being confused/ unconscious. She indicated he would want procedures to support him aggressively. We reviewed that a CVC and thoracentesis are indicated for management and potential source control (thora). She consented for both over the phone and I let her know to expect another phone call to get an additional person to consent her.   [name], DO 10/29/23 4:57 PM [facility]  For contact information, see Amion. If no response to pager, please call PCCM consult pager. After hours, 7PM- 7AM, please call Elink.

## 2023-10-29 NOTE — ED Triage Notes (Signed)
 Pt arrived from [facility] via ems, decreasing loc this am with bloody dark urine and exteremly hot to the touch. Was able to answer some questions on transport for a time but now unable. Smelled of urine and emesis ems suspected possible aspiration.  Vitals on ems arrival: Bp: 66/36 Hr 80 Spo2 92 CBG 104  Reported hx of chf and diabetes. Pt came with DNR paper work.

## 2023-10-29 NOTE — Progress Notes (Signed)
 eLink Physician-Brief Progress Note Patient Name: Peter Yang DOB: [birth year] MRN: [id number]   Date of Service  10/29/2023  HPI/Events of Note  K3.4, creatinine 1.73  eICU Interventions  KCl ordered     Intervention Category Minor Interventions: Electrolytes abnormality - evaluation and management  Peter Yang 10/29/2023, 8:45 PM

## 2023-10-29 NOTE — Consult Note (Addendum)
 Urology Consult Note   Requesting Attending Physician:  Gretta Leita SQUIBB, DO Service Providing Consult: Urology  Consulting Attending: Dr Glendia Elizabeth, MD   Reason for Consult: Hematuria  HPI: Peter Yang is seen in consultation for reasons noted above at the request of Gretta Leita SQUIBB, DO for the evaluation of hematuria.  This is a 68 y.o. male with very complicated history of A-fib on Eliquis , last dose unknown, CHF, hypertension, diabetes, prior history of necrotizing fasciitis who has a chronic Foley catheter previously had a suprapubic tube he was unable to be replaced who presents with altered mental status.  Urology consulted given concern for hematuria.  Patient was last seen on 09/12/2023 where he had his catheter exchanged via cystoscopy over a wire using a 16 [place] council catheter.  Cystoscopy at that time was notable for a small contracted bladder but otherwise no evidence of masses or lesions.  He presents to the ED today with encephalopathy.  Patient is currently afebrile but requiring pressor and is on 30 of norepinephrine .  Unable to obtain history given encephalopathy.   Catheter exchanged on 1/8.      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 Overview:  Added automatically from request for surgery [id number] Formatting of this note might be different from the original. Added automatically from request for surgery [id number]   Large bowel perforation (HCC) 09/27/2015   Malignant neoplasm of prostate (HCC) 10/22/2019   Myopia with astigmatism and presbyopia, bilateral 06/15/2018   Last Assessment & Plan:  Formatting of this note might be different from the original. - Wrx printed Formatting of this note might be different from the original. 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 - Stable, monitor Formatting of this note might be different from the original. Last Assessment & Plan:  - Stable, monitor   Retroperitoneal abscess (HCC) 10/02/2015   Status post corneal transplant 10/07/2016   Formatting of this note might be different from the original. Overview:  Added automatically from request for surgery [id number] Formatting of this note might be different from the original. Added automatically from request for surgery [id number]  Last Assessment & Plan:  Formatting of this note might be different from the original. - OD, 2/2 KCN - Stable, no NVK / rejection - Continue off steroids - Mo   Thoracic ascending aortic aneurysm (HCC)    TIA (transient ischemic attack) 2012   Type  2 diabetes mellitus [facility])     Past Surgical History:  Past Surgical History:  Procedure Laterality Date   BELOW KNEE LEG AMPUTATION Left 2022   CORONARY ANGIOPLASTY WITH STENT PLACEMENT  01/2022   DES RCA   CYSTOSCOPY N/A 06/08/2023   Procedure: CYSTOSCOPY;  Surgeon: Watt Rush, MD;  Location:  [facility] OR;  Service: Urology;  Laterality: N/A;   IRRIGATION AND DEBRIDEMENT ABSCESS N/A 04/02/2023   Procedure: IRRIGATION AND DEBRIDEMENT ABSCESS;  Surgeon: Cam Morene ORN, MD;  Location: [facility] OR;  Service: Urology;  Laterality: N/A;   IRRIGATION AND DEBRIDEMENT ABSCESS N/A 04/09/2023   Procedure: SCROTAL WASHOUT AND DEBRIDEMENT WITH WOUND CLOSURE;  Surgeon: Cam Morene ORN, MD;  Location: [facility] OR;  Service: Urology;  Laterality: N/A;  60 MINUTES   LEFT HEART CATH AND CORONARY ANGIOGRAPHY N/A 06/17/2022   Procedure: LEFT HEART CATH AND CORONARY ANGIOGRAPHY;  Surgeon: Mady Bruckner, MD;  Location: [facility] INVASIVE CV LAB;  Service: Cardiovascular;  Laterality: N/A;   ROTATOR CUFF REPAIR Left    SCROTAL EXPLORATION N/A 06/08/2023   Procedure: SCROTUM EXPLORATION;  Surgeon: Watt Rush, MD;  Location: [facility] OR;  Service: Urology;  Laterality: N/A;    Medication: Current Facility-Administered Medications  Medication Dose Route Frequency Provider Last Rate Last Admin   0.9 %  sodium chloride  infusion  250 mL Intravenous Continuous Kingsley, Victoria K, DO       ceftazidime -avibactam (AVYCAZ ) 1.25 g in dextrose  5 % 50 mL IVPB  1.25 g Intravenous Q8H Simpson, Paula B, NP       docusate sodium  (COLACE) capsule 100 mg  100 mg Oral BID PRN Gretta Doffing P, DO       doxycycline  (VIBRAMYCIN ) 100 mg in dextrose  5 % 250 mL IVPB  100 mg Intravenous Q12H Gretta Doffing P, DO       heparin  injection 5,000 Units  5,000 Units Subcutaneous Q8H Gretta Doffing P, DO       insulin  aspart (novoLOG ) injection 0-9 Units  0-9 Units Subcutaneous Q4H Gretta Doffing SQUIBB, DO       lactated ringers  infusion   Intravenous Continuous Kingsley, Victoria K, DO 150 mL/hr at 10/29/23 1553 New Bag at 10/29/23 1553   metroNIDAZOLE  (FLAGYL ) IVPB 500 mg  500 mg Intravenous Q8H Antonetta Moccasin B, NP       norepinephrine  (LEVOPHED ) 4mg  in (0.016 mg/mL) premix infusion  0-40 mcg/min Intravenous Continuous Kingsley, Victoria K, DO 112.5 mL/hr at  10/29/23 1601 30 mcg/min at 10/29/23 1601   ondansetron  (ZOFRAN ) injection 4 mg  4 mg Intravenous Q6H PRN Gretta Doffing P, DO       polyethylene glycol (MIRALAX  / GLYCOLAX ) packet 17 g  17 g Oral Daily PRN Gretta Doffing SQUIBB, DO        Allergies: Allergies  Allergen Reactions   Sglt2 Inhibitors Other (See Comments)    Fournier's gangrene    Social History: Social History   Tobacco Use   Smoking status: Never   Smokeless tobacco: Never  Substance Use Topics   Alcohol  use: Never   Drug use: Never    Family History Family History  Problem Relation Age of Onset   Diabetes Mother    Hypertension Mother    Heart disease Mother    Cancer Father    Heart attack Sister    Cancer Sister    Diabetes Brother     Review of Systems 10 systems were reviewed and are negative except as noted specifically in the HPI.  Objective  Vital signs in last 24 hours: BP 109/66   Pulse 76   Temp 98.7 F (37.1 C)   Resp 15   Ht 6' (1.829 m)   Wt 79.4 kg   SpO2 100%   BMI 23.73 kg/m   Physical Exam General: NAD, A&O, resting, appropriate HEENT: [place]/AT, EOMI, MMM Pulmonary: Normal work of breathing Cardiovascular: HDS, adequate peripheral perfusion GU: foley in place with pink urine with sediment. Extremities: warm and well perfused Neuro: Appropriate, no focal neurological deficits  Most Recent Labs: Lab Results  Component Value Date   WBC 29.0 (H) 10/29/2023   HGB 9.9 (L) 10/29/2023   HCT 30.2 (L) 10/29/2023   PLT 220 10/29/2023    Lab Results  Component Value Date   NA 136 10/29/2023   K 3.4 (L) 10/29/2023   CL 100 10/29/2023   CO2 24 10/29/2023   BUN 59 (H) 10/29/2023   CREATININE 1.73 (H) 10/29/2023   CALCIUM  8.0 (L) 10/29/2023   MG 1.9 06/19/2023   PHOS 3.2 05/07/2023    Lab Results  Component Value Date   INR 2.3 (H) 10/29/2023   APTT 37 (H) 10/29/2023       IMAGING: CT HEAD WO CONTRAST ( ) Result Date: 10/29/2023 CLINICAL DATA:  Encephalopathy. EXAM:  CT HEAD WITHOUT CONTRAST TECHNIQUE: Contiguous axial images were obtained from the base of the skull through the vertex without intravenous contrast. RADIATION DOSE REDUCTION: This exam was performed according to the departmental dose-optimization program which includes automated exposure control, adjustment of the mA and/or kV according to patient size and/or use of iterative reconstruction technique. 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 Remote lacunar infarct in the right globus pallidus is stable no acute infarct, hemorrhage, or mass lesion is present. Asymmetric ex vacuo dilation of the right lateral ventricle is noted. No significant extraaxial fluid collection is present. The brainstem and cerebellum are within normal limits. Midline structures are within normal limits. Vascular: Atherosclerotic calcifications are present within the cavernous internal carotid arteries bilaterally and at the dural margin vertebral arteries. No hyperdense vessel is present. Skull: Calvarium is intact. No focal lytic or blastic lesions are present. No significant extracranial soft tissue lesion is present. Sinuses/Orbits: Right lens replacement is present. Globes and orbits are otherwise within normal limits. The paranasal sinuses and mastoid air cells are clear. IMPRESSION: 1. No acute intracranial abnormality or significant interval change. 2. 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 3. Stable remote lacunar infarct in the right globus pallidus. Electronically Signed   By: Lonni Necessary M.D.   On: 10/29/2023 18:34   DG Chest Port 1 View Result Date: 10/29/2023 CLINICAL DATA:  Sepsis.  Evaluate for abnormality. EXAM: PORTABLE CHEST 1 VIEW COMPARISON:   06/11/2023. FINDINGS: Low lung volume. There is at least small right pleural effusion with probable associated compressive atelectatic changes and/or consolidation in the right lung base. There is also left retrocardiac airspace opacity obscuring the left hemidiaphragm, descending thoracic aorta and blunting the left lateral costophrenic angle, suggesting combination of left lung atelectasis and/or consolidation with pleural effusion. Bilateral lung fields are clear. No pneumothorax. Stable cardio-mediastinal silhouette. No acute osseous abnormalities. The soft tissues are within normal limits. IMPRESSION: Small right pleural effusion probable bilateral small pleural effusions with associated compressive atelectatic changes and/or consolidation. Correlate clinically. Electronically Signed  By: Ree Molt M.D.   On: 10/29/2023 15:27    ------  Assessment:  68 y.o. male with very complicated history of A-fib on Eliquis , last dose unknown, CHF, hypertension, diabetes, prior history of necrotizing fasciitis who has a chronic Foley catheter previously had a suprapubic tube he was unable to be replaced who presents with altered mental status and sepsis of unknown source.  Urology consulted given concern for grossly.  Patient currently still requiring norepinephrine .  He is on cefepime , metronidazole  vancomycin  and doxycycline .  Urine on my assessment was pink in color with sediment in the tubing.  Easily flushed to clear without any clot.  CT scan without significant clot around the Foley catheter bladder was not distended.  Patient had some mild bilateral hydronephrosis which was seen in August as well. No evidence of stone or obvious obstruction.    Recommendations: - # Gross hematuria -Seems to be resolving.  No evidence of clot retention.  At this time we will continue Foley catheter.  Catheter exchanged today 1/8 likely by ED. Catheter flushes easily on exam.  Catheter should be exchanged in 1  month. -Recommend sending a urine culture to assess for potential UTI.  #AKI and bilateral hydronephrosis -Elevated to 1.73, baseline of 0.8-1.  Continue to monitor at this time. Trend Cr until resolution. Likely elevated in the setting of unknown source of sepsis. -hydronephrosis is similar to prior CT on 8/14 on the left. Right pelvis dilated on CT in 8/14 but now with dilation down to bladder. We will follow up with a RUS. Likely more chronic in nature.    Follow up Patient need catheter exchange in office in 1 month and RUS at that time.   Martina Holding, MD, PhD [facility] Resident  [facility]   Thank you for this consult. Please contact the urology consult pager with any further questions/concerns.

## 2023-10-29 NOTE — Progress Notes (Signed)
 Elink is following code sepsis.

## 2023-10-29 NOTE — ED Notes (Signed)
 Unable to obtain 2nd set of blood cultures before abx. MD notified

## 2023-10-30 ENCOUNTER — Other Ambulatory Visit: Payer: Self-pay

## 2023-10-30 ENCOUNTER — Inpatient Hospital Stay (HOSPITAL_COMMUNITY): Payer: Medicare Other

## 2023-10-30 ENCOUNTER — Encounter (HOSPITAL_COMMUNITY): Payer: Self-pay | Admitting: Critical Care Medicine

## 2023-10-30 DIAGNOSIS — L89159 Pressure ulcer of sacral region, unspecified stage: Secondary | ICD-10-CM

## 2023-10-30 DIAGNOSIS — R6521 Severe sepsis with septic shock: Secondary | ICD-10-CM | POA: Diagnosis not present

## 2023-10-30 DIAGNOSIS — Z515 Encounter for palliative care: Secondary | ICD-10-CM

## 2023-10-30 DIAGNOSIS — N3001 Acute cystitis with hematuria: Secondary | ICD-10-CM

## 2023-10-30 DIAGNOSIS — G9341 Metabolic encephalopathy: Secondary | ICD-10-CM | POA: Diagnosis not present

## 2023-10-30 DIAGNOSIS — Z7189 Other specified counseling: Secondary | ICD-10-CM

## 2023-10-30 DIAGNOSIS — Z66 Do not resuscitate: Secondary | ICD-10-CM | POA: Diagnosis not present

## 2023-10-30 DIAGNOSIS — R7881 Bacteremia: Secondary | ICD-10-CM | POA: Diagnosis not present

## 2023-10-30 DIAGNOSIS — N179 Acute kidney failure, unspecified: Secondary | ICD-10-CM | POA: Diagnosis not present

## 2023-10-30 DIAGNOSIS — E43 Unspecified severe protein-calorie malnutrition: Secondary | ICD-10-CM | POA: Insufficient documentation

## 2023-10-30 DIAGNOSIS — G9340 Encephalopathy, unspecified: Secondary | ICD-10-CM

## 2023-10-30 DIAGNOSIS — A419 Sepsis, unspecified organism: Secondary | ICD-10-CM | POA: Diagnosis not present

## 2023-10-30 DIAGNOSIS — Z8619 Personal history of other infectious and parasitic diseases: Secondary | ICD-10-CM

## 2023-10-30 DIAGNOSIS — E119 Type 2 diabetes mellitus without complications: Secondary | ICD-10-CM

## 2023-10-30 LAB — BLOOD CULTURE ID PANEL (REFLEXED) - BCID2

## 2023-10-30 LAB — BASIC METABOLIC PANEL
Anion gap: 13 (ref 5–15)
Anion gap: 12 (ref 5–15)
BUN: 51 mg/dL — ABNORMAL HIGH (ref 8–23)
BUN: 55 mg/dL — ABNORMAL HIGH (ref 8–23)
CO2: 21 mmol/L — ABNORMAL LOW (ref 22–32)
CO2: 22 mmol/L (ref 22–32)
Calcium: 7.8 mg/dL — ABNORMAL LOW (ref 8.9–10.3)
Calcium: 7.9 mg/dL — ABNORMAL LOW (ref 8.9–10.3)
Chloride: 97 mmol/L — ABNORMAL LOW (ref 98–111)
Chloride: 96 mmol/L — ABNORMAL LOW (ref 98–111)
Creatinine, Ser: 1.46 mg/dL — ABNORMAL HIGH (ref 0.61–1.24)
Creatinine, Ser: 1.36 mg/dL — ABNORMAL HIGH (ref 0.61–1.24)
GFR, Estimated: 53 mL/min — ABNORMAL LOW (ref >60)
GFR, Estimated: 57 mL/min — ABNORMAL LOW (ref >60)
Glucose, Bld: 311 mg/dL — ABNORMAL HIGH (ref 70–99)
Glucose, Bld: 290 mg/dL — ABNORMAL HIGH (ref 70–99)
Potassium: 3.8 mmol/L (ref 3.5–5.1)
Potassium: 3.8 mmol/L (ref 3.5–5.1)
Sodium: 131 mmol/L — ABNORMAL LOW (ref 135–145)
Sodium: 130 mmol/L — ABNORMAL LOW (ref 135–145)

## 2023-10-30 LAB — GLUCOSE, CAPILLARY
Glucose-Capillary: 182 mg/dL — ABNORMAL HIGH (ref 70–99)
Glucose-Capillary: 219 mg/dL — ABNORMAL HIGH (ref 70–99)
Glucose-Capillary: 242 mg/dL — ABNORMAL HIGH (ref 70–99)
Glucose-Capillary: 243 mg/dL — ABNORMAL HIGH (ref 70–99)
Glucose-Capillary: 303 mg/dL — ABNORMAL HIGH (ref 70–99)
Glucose-Capillary: 332 mg/dL — ABNORMAL HIGH (ref 70–99)

## 2023-10-30 LAB — CBC
HCT: 31.2 % — ABNORMAL LOW (ref 39.0–52.0)
Hemoglobin: 10.4 g/dL — ABNORMAL LOW (ref 13.0–17.0)
MCH: 29.6 pg (ref 26.0–34.0)
MCHC: 33.3 g/dL (ref 30.0–36.0)
MCV: 88.9 fL (ref 80.0–100.0)
Platelets: 325 10*3/uL (ref 150–400)
RBC: 3.51 MIL/uL — ABNORMAL LOW (ref 4.22–5.81)
RDW: 18.1 % — ABNORMAL HIGH (ref 11.5–15.5)
WBC: 36.6 10*3/uL — ABNORMAL HIGH (ref 4.0–10.5)
nRBC: 0 % (ref 0.0–0.2)

## 2023-10-30 LAB — PHOSPHORUS: Phosphorus: 3.7 mg/dL (ref 2.5–4.6)

## 2023-10-30 LAB — STREP PNEUMONIAE URINARY ANTIGEN: Strep Pneumo Urinary Antigen: NEGATIVE

## 2023-10-30 LAB — LACTIC ACID, PLASMA: Lactic Acid, Venous: 1.2 mmol/L (ref 0.5–1.9)

## 2023-10-30 LAB — CYTOLOGY - NON PAP

## 2023-10-30 LAB — MAGNESIUM
Magnesium: 1.8 mg/dL (ref 1.7–2.4)
Magnesium: 1.7 mg/dL (ref 1.7–2.4)

## 2023-10-30 MED ORDER — THIAMINE MONONITRATE 100 MG PO TABS
100.0000 mg | ORAL_TABLET | Freq: Every day | ORAL | Status: DC
  Administered 2023-10-30 – 2023-11-05 (×6): 100 mg via ORAL
  Filled 2023-10-30 (×7): qty 1

## 2023-10-30 MED ORDER — ADULT MULTIVITAMIN LIQUID CH
15.0000 mL | Freq: Every day | ORAL | Status: DC
  Administered 2023-10-30: 15 mL via ORAL
  Filled 2023-10-30 (×2): qty 15

## 2023-10-30 MED ORDER — ORAL CARE MOUTH RINSE
15.0000 mL | OROMUCOSAL | Status: DC | PRN
Start: 2023-10-30 — End: 2023-11-05

## 2023-10-30 MED ORDER — CHLORHEXIDINE GLUCONATE CLOTH 2 % EX PADS
6.0000 | MEDICATED_PAD | Freq: Every day | CUTANEOUS | Status: DC
  Administered 2023-10-30 – 2023-11-05 (×7): 6 via TOPICAL

## 2023-10-30 MED ORDER — MAGNESIUM SULFATE 2 GM/50ML IV SOLN
2.0000 g | Freq: Once | INTRAVENOUS | Status: AC
  Administered 2023-10-30: 2 g via INTRAVENOUS
  Filled 2023-10-30: qty 50

## 2023-10-30 MED ORDER — ATORVASTATIN CALCIUM 40 MG PO TABS
40.0000 mg | ORAL_TABLET | Freq: Every day | ORAL | Status: DC
  Administered 2023-10-30 – 2023-11-03 (×5): 40 mg via ORAL
  Filled 2023-10-30 (×5): qty 1

## 2023-10-30 MED ORDER — HYDROCORTISONE SOD SUC (PF) 100 MG IJ SOLR
100.0000 mg | Freq: Two times a day (BID) | INTRAMUSCULAR | Status: DC
  Administered 2023-10-30 (×2): 100 mg via INTRAVENOUS
  Filled 2023-10-30 (×2): qty 2

## 2023-10-30 MED ORDER — ACETAMINOPHEN 325 MG PO TABS
650.0000 mg | ORAL_TABLET | Freq: Four times a day (QID) | ORAL | Status: DC | PRN
  Administered 2023-10-30 – 2023-11-01 (×2): 650 mg via ORAL
  Filled 2023-10-30 (×2): qty 2

## 2023-10-30 MED ORDER — FOLIC ACID 1 MG PO TABS
1.0000 mg | ORAL_TABLET | Freq: Every day | ORAL | Status: DC
  Administered 2023-10-30 – 2023-11-05 (×6): 1 mg via ORAL
  Filled 2023-10-30 (×7): qty 1

## 2023-10-30 MED ORDER — ZINC SULFATE 220 (50 ZN) MG PO CAPS
220.0000 mg | ORAL_CAPSULE | Freq: Every day | ORAL | Status: DC
  Administered 2023-10-31 – 2023-11-05 (×5): 220 mg via ORAL
  Filled 2023-10-30 (×6): qty 1

## 2023-10-30 MED ORDER — HYDROCORTISONE SOD SUC (PF) 100 MG IJ SOLR: 50.0000 mg | Freq: Four times a day (QID) | INTRAMUSCULAR | Status: DC

## 2023-10-30 MED ORDER — VITAMIN C 500 MG PO TABS
500.0000 mg | ORAL_TABLET | Freq: Every day | ORAL | Status: DC
  Administered 2023-10-31 – 2023-11-05 (×5): 500 mg via ORAL
  Filled 2023-10-30 (×6): qty 1

## 2023-10-30 MED ORDER — MIDODRINE HCL 5 MG PO TABS
10.0000 mg | ORAL_TABLET | Freq: Three times a day (TID) | ORAL | Status: DC
  Administered 2023-10-30 – 2023-11-05 (×15): 10 mg via ORAL
  Filled 2023-10-30 (×16): qty 2

## 2023-10-30 MED ORDER — ENSURE ENLIVE PO LIQD
237.0000 mL | Freq: Three times a day (TID) | ORAL | Status: DC
  Administered 2023-10-30 – 2023-11-03 (×13): 237 mL via ORAL

## 2023-10-30 MED ORDER — INSULIN GLARGINE-YFGN 100 UNIT/ML ~~LOC~~ SOLN
8.0000 [IU] | Freq: Two times a day (BID) | SUBCUTANEOUS | Status: DC
  Administered 2023-10-30 (×2): 8 [IU] via SUBCUTANEOUS
  Filled 2023-10-30 (×4): qty 0.08

## 2023-10-30 MED ORDER — SODIUM CHLORIDE 0.9 % IV SOLN
1.0000 g | Freq: Three times a day (TID) | INTRAVENOUS | Status: DC
  Administered 2023-10-31 – 2023-11-05 (×16): 1 g via INTRAVENOUS
  Filled 2023-10-30 (×16): qty 20

## 2023-10-30 NOTE — Inpatient Diabetes Management (Signed)
 Inpatient Diabetes Program Recommendations  AACE/ADA: New Consensus Statement on Inpatient Glycemic Control (2025)  Target Ranges:  Prepandial:   less than 140 mg/dL      Peak postprandial:   less than 180 mg/dL (1-2 hours)      Critically ill patients:  140 - 180 mg/dL   Lab Results  Component Value Date   GLUCAP 242 (H) 10/30/2023   HGBA1C 6.0 (H) 10/29/2023    Review of Glycemic Control  Latest Reference Range & Units 10/29/23 17:20 10/29/23 20:39 10/30/23 00:14 10/30/23 03:45 10/30/23 08:29  Glucose-Capillary 70 - 99 mg/dL 841 (H) 780 (H) 780 (H) 243 (H) 242 (H)   Diabetes history: DM 2 Outpatient Diabetes medications:  Tresiba  23 units q HS, Humalog 0-15 units tid with meals Current orders for Inpatient glycemic control:  Novolog  0-9 units q 4 hours  Inpatient Diabetes Program Recommendations:    Note patient is on basal insulin  at SNF. Please consider adding Semglee  8 units bid while in the hospital?    Thanks,  Randall Bullocks, RN, BC-ADM Inpatient Diabetes Coordinator Pager [phone]  (8a-5p)

## 2023-10-30 NOTE — Progress Notes (Signed)
 BCID showing CTX M ESBL in blood.  Switch from avycaz to [facility]

## 2023-10-30 NOTE — Progress Notes (Signed)
 Pharmacy Antibiotic Note  Lonie Newsham is a 68 y.o. male with ESBL producing Klebsiella pneumoniae bacteremia.  Pharmacy has been consulted for Meropenem  dosing.  Plan: Meropenem  1 g IV q8h  Height: 6' (182.9 cm) Weight: 88.5 kg (195 lb 1.7 oz) IBW/kg (Calculated) : 77.6  Temp (24hrs), Avg:98.1 F (36.7 C), Min:97 F (36.1 C), Max:98.6 F (37 C)  Recent Labs  Lab 10/29/23 1457 10/29/23 1504 10/29/23 1730 10/29/23 1740 10/29/23 2321 10/30/23 0403  WBC 33.6*  --  29.0*  --  35.2* 36.6*  CREATININE 1.73*  --   --   --  1.46* 1.36*  LATICACIDVEN  --  1.9  --  1.5 1.2  --     Estimated Creatinine Clearance: 58.6 mL/min (A) (by C-G formula based on SCr of 1.36 mg/dL (H)).    Allergies  Allergen Reactions   Sglt2 Inhibitors Other (See Comments)    Fournier's gangrene    Dail Cordella Misty 10/30/2023 10:47 PM

## 2023-10-30 NOTE — IPAL (Signed)
  Interdisciplinary Goals of Care Family Meeting   Date carried out:: 10/30/2023  Location of the meeting: Bedside  Member's involved: Physician and Bedside Registered Nurse  Durable Power of Attorney or acting medical decision maker: self    Discussion: We discussed goals of care for Peter Yang .  At admission he was unable to make his own decisions. He wishes to continue current care and does want to continue aggressive care measures in the future that are indicated. He still wishes for [name] to be his surrogate decisino maker.   Code status: Limited Code or DNR with short term- ok with pressors and current care  Disposition: Continue current acute care   Time spent for the meeting: 10 min   [name] 10/30/2023, 3:24 PM

## 2023-10-30 NOTE — Consult Note (Signed)
 WOC Nurse Consult Note: Reason for Consult: wounds Patient from SNF with AMS; however patient is very clear with me, aware of wounds and surgical removal of 5th toe on the right foot. History of BKA on the left  Wound type: Deep Tissue Pressure Injury: sacrum; dark purple non blanchable tissue; superficial skin loss; 15cm x 12cm x 0 Arterial vs Neuropathic full thickness ulceration right plantar surface 5th metatarsal head; 100% stable black eschar Deep Tissue Pressure Injury; right heel; 2.0cm x 3.0cm x 0cm  Left hand; dark purple area medial hand near thumb; 1cm x 1cm x 0cm  Stage 1 Pressure Injury; just proximal to the larger DTPT; non blanchable redness; 1cmx 1cm x 0cm Scattered senile purpura areas over the upper extremities; all intact; less than 1cm in diameter  Pressure Injury POA: Yes Measurement: see above  Wound bed: see above  Drainage (amount, consistency, odor) serosanguinous from the sacrum; none otherwise from the other wounds Periwound: intact Dressing procedure/placement/frequency: Single layer of xeroform to the sacral wound; top with foam. Change xeroform daily, foam every 3 days  Prevalon boot to the right heel for offloading Foam to the right heel for protection Paint left plantar foot wound with betadine daily No topical care for the hand wound Foam for sacral wound is protecting the wound just proximal to it (Stage 1) Low air loss mattress for moisture management and pressure redistribution while in the ICU, will need air mattress if DC to non ICU bed Turn and reposition per hospital policy Consult RD for wound healing    Re consult if needed, will not follow at this time. Thanks  [employer]as[employer]o [employer], RN,CWOCN, CNS, CWON-AP [phone])

## 2023-10-30 NOTE — Plan of Care (Signed)
  Problem: Coping: Goal: Ability to adjust to condition or change in health will improve Outcome: Not Progressing

## 2023-10-30 NOTE — Progress Notes (Signed)
 PHARMACY - PHYSICIAN COMMUNICATION CRITICAL VALUE ALERT - BLOOD CULTURE IDENTIFICATION (BCID)  Peter Yang is an 68 y.o. male who presented to [facility] on 10/29/2023 with septic shock  Assessment:  Blood cultures show GNR in 1/4 botttles and BCID shows klebsiella pneumoniae with the CTX-M gene detected   Name of physician (or Provider) Contacted: Dr. Gretta  Current antibiotics: Avycaz  1.25gm IV q8h  Changes to prescribed antibiotics recommended:  Continue Avycaz  and follow with ID plans Patient is on recommended antibiotics - No changes needed  Results for orders placed or performed during the hospital encounter of 10/29/23  Blood Culture ID Panel (Reflexed) (Collected: 10/29/2023  2:14 PM)  Result Value Ref Range   Enterococcus faecalis NOT DETECTED NOT DETECTED   Enterococcus Faecium NOT DETECTED NOT DETECTED   Listeria monocytogenes NOT DETECTED NOT DETECTED   Staphylococcus species NOT DETECTED NOT DETECTED   Staphylococcus aureus (BCID) NOT DETECTED NOT DETECTED   Staphylococcus epidermidis NOT DETECTED NOT DETECTED   Staphylococcus lugdunensis NOT DETECTED NOT DETECTED   Streptococcus species NOT DETECTED NOT DETECTED   Streptococcus agalactiae NOT DETECTED NOT DETECTED   Streptococcus pneumoniae NOT DETECTED NOT DETECTED   Streptococcus pyogenes NOT DETECTED NOT DETECTED   A.calcoaceticus-baumannii NOT DETECTED NOT DETECTED   Bacteroides fragilis NOT DETECTED NOT DETECTED   Enterobacterales DETECTED (A) NOT DETECTED   Enterobacter cloacae complex NOT DETECTED NOT DETECTED   Escherichia coli NOT DETECTED NOT DETECTED   Klebsiella aerogenes NOT DETECTED NOT DETECTED   Klebsiella oxytoca NOT DETECTED NOT DETECTED   Klebsiella pneumoniae DETECTED (A) NOT DETECTED   Proteus species NOT DETECTED NOT DETECTED   Salmonella species NOT DETECTED NOT DETECTED   Serratia marcescens NOT DETECTED NOT DETECTED   Haemophilus influenzae NOT DETECTED NOT DETECTED   Neisseria  meningitidis NOT DETECTED NOT DETECTED   Pseudomonas aeruginosa NOT DETECTED NOT DETECTED   Stenotrophomonas maltophilia NOT DETECTED NOT DETECTED   Candida albicans NOT DETECTED NOT DETECTED   Candida auris NOT DETECTED NOT DETECTED   Candida glabrata NOT DETECTED NOT DETECTED   Candida krusei NOT DETECTED NOT DETECTED   Candida parapsilosis NOT DETECTED NOT DETECTED   Candida tropicalis NOT DETECTED NOT DETECTED   Cryptococcus neoformans/gattii NOT DETECTED NOT DETECTED   CTX-M ESBL DETECTED (A) NOT DETECTED   Carbapenem resistance IMP NOT DETECTED NOT DETECTED   Carbapenem resistance KPC NOT DETECTED NOT DETECTED   Carbapenem resistance NDM NOT DETECTED NOT DETECTED   Carbapenem resist OXA 48 LIKE NOT DETECTED NOT DETECTED   Carbapenem resistance VIM NOT DETECTED NOT DETECTED    Prentice Poisson, PharmD Clinical Pharmacist **Pharmacist phone directory can now be found on [email or web address] ([email or web address]).  Listed under [facility].

## 2023-10-30 NOTE — Treatment Plan (Signed)
 Urology treatment plan: Patient with improvement of creatinine to from 1.36 down from 1.73. patient with improvement on NE.   Urology follow up requested. Continue to trend creatinine. Continue foley catheter.   Urology to sign off.   Martina Holding, MD, PhD [facility] Resident  [facility]

## 2023-10-30 NOTE — Progress Notes (Signed)
 NAME:  Peter Yang, MRN:  [id number], DOB:  1956-05-26, LOS: 1 ADMISSION DATE:  10/29/2023, CONSULTATION DATE:  10/29/23 REFERRING MD:  Dr. Yolande, CHIEF COMPLAINT:  AMS   History of Present Illness:  Mr. Pelissier is a 68 y/o gentleman with a history of debility requiring SNF/ rehab since BKA for necrotizing fasciitis, Fournier's gangrene, DM, chronic hypotension, ureterocutaneous fistula who presented with confusion, feeling hot, and hypotension from [place]. He has been treated for several days with ciprofloxacin for UTI. He had gross hematuria and had his foley changed in the ED. He was initially confused, but was started on an epi infusion by EMS with improvement in his mentation. In the ED he became more confused. He is currently unarousable and unable to answer questions or provide additional history. Per his emergency contact, he has always been able to make his own decisions. In the ED he had 1/2 blood cultures obtained before antibiotics due to difficult access. He was started on NE at 30mcg via PIV.    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 Overview:  Added automatically from request for surgery [id number]  Formatting of this note might be different from the original. Added automatically from request for surgery [id number]   Large bowel perforation (HCC) 09/27/2015   Malignant neoplasm of prostate (HCC) 10/22/2019   Myopia with astigmatism and presbyopia, bilateral 06/15/2018   Last Assessment & Plan:  Formatting of this note might be different from the original. - Wrx printed Formatting of this note might be different from the original. 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 - Stable, monitor Formatting of this note might be different from the original. Last Assessment & Plan:  - Stable, monitor   Retroperitoneal abscess (HCC) 10/02/2015   Status post corneal transplant 10/07/2016   Formatting of this note might be different from the original. Overview:  Added automatically from request for surgery [id number] Formatting of this note might be different from the original. Added automatically from request for surgery [id number]  Last Assessment & Plan:  Formatting of this note might be different from the original. - OD, 2/2 KCN - Stable, no NVK / rejection - Continue off steroids - Mo   Thoracic ascending aortic aneurysm (HCC)    TIA (transient ischemic attack) 2012   Type 2 diabetes mellitus (HCC)    Significant Hospital Events: Including procedures, antibiotic start and stop dates in  addition to other pertinent events   1/8 admission, given cefepime , flagyl , vanc in ED. Foley changed in ED. On peripiheral NE.  1/8 L [place] CVL placed, R thoracentesis with 1500cc fluid removed, transudative. Gram stain neg, Cultures and cytology pending.  Interim History / Subjective:  Pressor  needs down from 30 to 10.  Mental status much improved. Asking for water  and sprite to drink.  Objective   Blood pressure 111/75, pulse 81, temperature 98.1 F (36.7 C), resp. rate (!) 24, height 6' (1.829 m), weight 88.5 kg, SpO2 100%.        Intake/Output Summary (Last 24 hours) at 10/30/2023 1032 Last data filed at 10/30/2023 0800 Gross per 24 hour  Intake 8000.8 ml  Output 810 ml  Net 7190.8 ml   Filed Weights   10/29/23 1408 10/30/23 0500  Weight: 79.4 kg 88.5 kg   Examination: General:  ill appearing elderly man lying in bed obtunded HEENT: [place]/AT, eyes anicteric. Dried blood on teeth, oral mucosa & lips.  Neuro: unresponsive, reactive pupils CV: S1S2, irreg rhythm PULM:  central apneas, decreased R basilar breath sounds. No rhonchi. GI: soft, NT Extremities: L BKA, no significant edema Skin: scabbed excoriations, L heel wound and buttock DTI (see WOC note and image of buttock wound below).    Assessment & Plan:   Septic shock; suspect urinary source/cystitis. There was initially some concern pulmonary/parapneumonic effusion but effusion is transudative with negative gram stain so far. Cultures and cyto pending. RVP negative. ?relative AI - 2/2 above, on chronic Cortef . Chronic hypotension on Midodrine . Hx PAF on Apixaban . - Continue NE as needed to maintain MAP > 65. Wean as able - Continue Avycaz  per ID, appreciate the assistance. - Get CVP's. - Follow blood cultures and pleural fluid cultures. - Continue stress dose steroids in lieu of PTA Cortef  5mg  BID. - Continue PTA Midodrine .  Gross hematuria - improving. Chronic foley - s/p change in ED. Chronic hydronephrosis - Appreciate urology assistance. - Add culture from prior foley. - Needs foley exchanged in 1 month. - Add renal US  per urology recs. - Continue to hold DOAC for now.  Acute metabolic encephalopathy due to sepsis - improving. CT head negative. -avoid deliriogenic meds -maintain adequate  perfusion  AKI, pre-renal vs septic ATN. Hyponatremia. - Continue fluids until taking adequate PO. - Follow up renal US . - Follow BMP.  Hyperbilirubinemia, mild. -monitor intermittently.  Anemia- unsure how far off recent baseline he is -hold DOAC for now until hematuria fully clears. -Transfuse for Hgb < 7  Hx DM. - Continue SSI. - Add Semglee . - Hold PTA Tresiba .  GOC -DNR form at bedside, confirmed by s/o [name]. She consented for procedures. Reviewed 03/2023 Palliative notes- poor QOL but he didn't want to stop aggressive care. Prognosis was identified to be poor during that admission.  Sacral DTI and Right heel DTI (POA). - See WOC notes, appreciate the assistance. - Frequent turns. - Dietician consult.   Best Practice (right click and Reselect all SmartList Selections daily)   Diet/type: NPO DVT prophylaxis prophylactic heparin   Pressure ulcer(s): pressure ulcer assessment deferred  GI prophylaxis: N/A Lines: Central line Foley:  Yes, and it is still needed Code Status:  DNR Last date of multidisciplinary goals of care discussion [1/8 with POA [facility]]   Critical care time: 35 min.    Sammi Gore, PA - [facility] [place] [facility] For pager details, please see AMION or use Epic chat  After 1900, please  call [facility] for cross coverage needs 10/30/2023, 11:00 AM

## 2023-10-30 NOTE — Progress Notes (Signed)
 [facility] ADULT ICU REPLACEMENT PROTOCOL   The patient does apply for the [facility] Adult ICU Electrolyte Replacment Protocol based on the criteria listed below:   1.Exclusion criteria: TCTS, ECMO, Dialysis, and Myasthenia Gravis patients 2. Is GFR >/= 30 ml/min? Yes.    Patient's GFR today is 57 3. Is SCr </= 2? Yes.   Patient's SCr is 1.36 mg/dL 4. Did SCr increase >/= 0.5 in 24 hours? No. 5.Pt's weight >40kg  Yes.   6. Abnormal electrolyte(s):   Mg 1.7  7. Electrolytes replaced per protocol 8.  Call MD STAT for K+ </= 2.5, Phos </= 1, or Mag </= 1 Physician:  A. Haze Blackbird R Demorio Seeley 10/30/2023 4:46 AM

## 2023-10-30 NOTE — Progress Notes (Signed)
 Initial Nutrition Assessment  DOCUMENTATION CODES:   Severe malnutrition in context of chronic illness  INTERVENTION:  Liberalize diet to Regular Ensure Enlive po BID, each supplement provides 350 kcal and 20 grams of protein Magic cup TID with meals, each supplement provides 290 kcal and 9 grams of protein  Continue MVI, thiamine , and folic acid  supplementation  Vitamin C  supplementation (250mg  BID) x 30 days Zinc  supplementation (220mg  QD x14 days)  NUTRITION DIAGNOSIS:  Severe Malnutrition related to chronic illness as evidenced by severe fat depletion, severe muscle depletion, energy intake < or equal to 75% for > or equal to 1 month.  GOAL:  Patient will meet greater than or equal to 90% of their needs  MONITOR:  Supplement acceptance, PO intake, Labs, Skin  REASON FOR ASSESSMENT:  Consult Wound healing  ASSESSMENT:   Pt arrived from [facility] d/t AMS and hypotension and hematuria suggestive of UTI. PMH: CHF T2DM, A-fib, HTN, HLD, LBKA, prior necrotizing fasciitis.  Has come off pressor support and with IV ABX ordered for UTI. Had R thoracentesis yesterday with 1.5L removed and tested.  Endorsed appetite as not great at time of bedside visit. Stated this has been going on for the last six months. He does not care for the food at his facility. He does consume Glucerna there as well. Reports no difficulty with chewing or swallowing.  He is refeeding risk. Thiamine  already ordered. Will monitor K+, PHOS, Mg lab trends. Meals at his facility are very similar to meals served at bedside, per his report. Required potassium and magnesium  supplementation.  He presents with multiple wounds and what appears to be petechiae to bilateral arms. Monitoring for vitamin C  deficiency. Will order vitamin C  and zinc  supplementation as well as lab draws to support wound healing.  Admit Weight: 88.5kg  Endorses UBW around 96.4kg. Unsure of when he last weight that last. Mild edema  noted during physical exam.  Labs:   Na+ 130<--131 CBGs 100-311mg /dL x2 days J8r 6.0 (98/7974) Crt 1.36<--1.46<--1.73  Meds: folic acid , SSI, semglee , MVI, thiamine , solu-cortef , IV ABX, KCl, magnesium  sulfate   NUTRITION - FOCUSED PHYSICAL EXAM: Flowsheet Row Most Recent Value  Orbital Region Mild depletion  Upper Arm Region Severe depletion  Thoracic and Lumbar Region Moderate depletion  Buccal Region Severe depletion  Temple Region Severe depletion  Clavicle and Acromion Bone Region Moderate depletion  Scapular Bone Region Moderate depletion  Patellar Region Moderate depletion  Anterior Thigh Region Severe depletion  Posterior Calf Region Severe depletion  [LBKA]  Edema (RD Assessment) Mild  Hair Reviewed  Eyes Reviewed  Mouth Reviewed  Skin Reviewed  Nails Reviewed    Diet Order:   Diet Order             Diet regular Room service appropriate? Yes; Fluid consistency: Thin  Diet effective now             EDUCATION NEEDS:   Not appropriate for education at this time  Skin:  Skin Assessment: Skin Integrity Issues: Skin Integrity Issues:: DTI, Stage I (Non-pressure wounds to: R anterior knee, L hand, R foot) DTI: Sacrum Medial Deep Tissue Pressure Injury - Purple or maroon localized area of discolored intact skin or blood-filled blister due to damage of underlying soft tissue from pressure and/or shear Stage I: Upper Sacrum, R Heel  Last BM:  01/09  Height:  Ht Readings from Last 1 Encounters:  10/29/23 6' (1.829 m)    Weight:  Wt Readings from Last 1 Encounters:  10/30/23 88.5 kg    Ideal Body Weight:  80.9 kg  BMI:  Body mass index is 26.46 kg/m.  Estimated Nutritional Needs:   Kcal:  2200-2400kcal  Protein:  115-125g  Fluid:  >2L/day  Blair Deaner MS, RD, LDN Registered Dietitian Clinical Nutrition RD Inpatient Contact Info in Amion

## 2023-10-30 NOTE — Progress Notes (Signed)
 eLink Physician-Brief Progress Note Patient Name: Peter Yang DOB: [birth year] MRN: [id number]   Date of Service  10/30/2023  HPI/Events of Note  68 year old with a history of BKA initially presented with Fournier's gangrene in the setting of septic shock-improved 7 out of 10 headache, requesting Tylenol   eICU Interventions  Add Tylenol  as needed     Intervention Category Minor Interventions: Routine modifications to care plan (e.g. PRN medications for pain, fever)  Christi Wirick 10/30/2023, 9:41 PM

## 2023-10-30 NOTE — Consult Note (Signed)
 Consultation Note Date: 10/30/2023   Patient Name: Peter Yang  DOB: [birth year]  MRN: [id number]  Age / Sex: 68 y.o., male  PCP: Patient, No Pcp Per Referring Physician: Gretta Leita SQUIBB, DO  Reason for Consultation: Establishing goals of care  HPI/Patient Profile: 68 y.o. male  with past medical history of Fournier's gangrene, DM, chronic hypotension, ureterocutaneous fistula, and history of debility requiring SNF/ rehab since BKA for necrotizing fasciitis admitted on 10/29/2023 with septic shock - suspected urinary source. Patient with hypotension requiring vasopressors. PMT consulted to discuss GOC.    Clinical Assessment and Goals of Care: I have reviewed medical records including EPIC notes, labs and imaging, assessed the patient and then met with patient  to discuss diagnosis prognosis, GOC, EOL wishes, disposition and options.  I introduced Palliative Medicine as specialized medical care for people living with serious illness. It focuses on providing relief from the symptoms and stress of a serious illness. The goal is to improve quality of life for both the patient and the family.  Mr Tomei has been seen by our team multiple times in the past.   Today Mr Crescenzo shares that he has been staying at a facility since his last hospital admission though he is clear he does not like this facility and his ultimate goal is to return home. We discuss he will likely need to return to facility following this admission and he expresses understanding.   Mr. Blanford shares that he is mostly bed/wheelchair bound. He speaks of good appetite.    We discussed patient's current illness and what it means in the larger context of patient's on-going co-morbidities.  We discuss his improvement since admission. He is okay with current medical treatment and would like to continue.   We discuss his friend Dickey - he shares she is not local but he communicates with  her regularly - he is reluctant to agree to anyone to make medical decisions for him but we discuss situation where he may not be able and he shares if he were definitely unable to make decisions for himself he would trust Dickey to do so for him. He has had many conversations with Dickey regarding his healthcare wishes.   He asks that I call Dickey to provide her with update.   Discussed with Mr. Gangemi the importance of continued conversation with family and the medical providers regarding overall plan of care and treatment options, ensuring decisions are within the context of the patient's values and GOCs.     Questions and concerns were addressed. Mr Hartje was encouraged to call with questions or concerns.   Call to [facility] - provided update to her. Discussed Mr Wease would like her to serve as decision maker if he were unable - she is aware of this and agrees, feels she understands his wishes.   Primary Decision Maker PATIENT    SUMMARY OF RECOMMENDATIONS   - patient would like to continue current course of treatment, time for outcomes, improving with current treatment - seen multiple times by PMT in past - DNR/DNI - Patient reluctant to name HCPOA - has document that states no HCPOA though he does share if he were unable to make decisions he would allow friend [name] [phone]) to do so - he has no other contacts listed and so she is the only individual reasonably available to serve as decision maker if one were needed - Dickey is aware of this and agrees to serve as management consultant if  needed - consider outpatient palliative referral at discharge, will put in [facility] request  Code Status/Advance Care Planning: DNR       Primary Diagnoses: Present on Admission:  Shock (HCC)  Pleural effusion   I have reviewed the medical record, interviewed the patient and family, and examined the patient. The following aspects are pertinent.  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 Overview:  Added automatically from request for surgery [id number] Formatting of this note might be different from the original. Added automatically from request for surgery [id number]   Large bowel perforation (HCC) 09/27/2015   Malignant neoplasm of prostate (HCC) 10/22/2019   Myopia with astigmatism and presbyopia, bilateral 06/15/2018   Last Assessment & Plan:  Formatting of this note might be different from the original. - Wrx printed Formatting of this note might be different from the original. 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 - Stable, monitor Formatting of this note might be different from the original. Last Assessment & Plan:  - Stable, monitor   Retroperitoneal abscess (HCC) 10/02/2015   Status post corneal transplant 10/07/2016   Formatting of this note might be different from the original. Overview:  Added automatically  from request for surgery [id number] Formatting of this note might be different from the original. Added automatically from request for surgery [id number]  Last Assessment & Plan:  Formatting of this note might be different from the original. - OD, 2/2 KCN - Stable, no NVK / rejection - Continue off steroids - Mo   Thoracic ascending aortic aneurysm (HCC)    TIA (transient ischemic attack) 2012   Type 2 diabetes mellitus (HCC)    Social History   Socioeconomic History   Marital status: Single    Spouse name: Not on file   Number of children: Not on file   Years of education: Not on file   Highest education level: Not on file  Occupational History   Not on file  Tobacco Use   Smoking status: Never   Smokeless tobacco: Never  Substance and Sexual Activity   Alcohol  use: Never   Drug use: Never   Sexual activity: Not on file  Other Topics Concern   Not on file  Social History Narrative   Not on file   Social Drivers of Health   Financial Resource Strain: Low Risk  (08/13/2018)   Received from [facility] prior to 12/21/2022., [facility] visits prior to 12/21/2022.   Overall Financial Resource Strain (CARDIA)    Difficulty of Paying Living Expenses: Not hard at all  Food Insecurity: No Food Insecurity (10/30/2023)   Hunger Vital Sign    Worried About Running Out of Food in the Last Year: Never true    Ran Out of Food in the Last Year: Never true  Transportation Needs: No Transportation Needs (10/30/2023)   PRAPARE - Administrator, Civil Service (Medical): No    Lack of Transportation (Non-Medical): No  Physical Activity: Not on file  Stress: Not on file  Social Connections: Socially Isolated (10/30/2023)   Social Connection and Isolation Panel [NHANES]    Frequency of Communication with Friends and Family: Three times a week    Frequency of Social Gatherings with Friends and Family: Once a week    Attends Religious Services:  Never    Database Administrator or Organizations: No    Attends Engineer, Structural: Never    Marital Status: Never married   Family History  Problem Relation Age of Onset   Diabetes Mother    Hypertension Mother    Heart disease Mother    Cancer Father    Heart attack Sister    Cancer Sister    Diabetes Brother    Scheduled Meds:  atorvastatin   40 mg Oral QHS   Chlorhexidine  Gluconate Cloth  6 each Topical Daily   feeding supplement  237 mL Oral TID BM   folic acid   1 mg Oral Daily   heparin   5,000 Units Subcutaneous Q8H   hydrocortisone  sodium succinate   100 mg Intravenous Q12H   insulin  aspart  0-9 Units Subcutaneous Q4H   insulin  glargine-yfgn  8 Units Subcutaneous BID   midodrine   10 mg Oral TID with meals   multivitamin  15 mL Oral Daily   thiamine   100 mg Oral Daily   Continuous Infusions:  ceftazidime -avibactam (AVYCAZ ) 1.25 g in dextrose  5 % 50 mL IVPB Stopped (10/30/23 0959)   norepinephrine  (LEVOPHED ) Adult infusion 7 mcg/min (10/30/23 1400)   PRN Meds:.docusate sodium , ondansetron  (ZOFRAN ) IV, mouth rinse, polyethylene glycol Allergies  Allergen Reactions   Sglt2 Inhibitors Other (See Comments)    Fournier's gangrene   Review of Systems  Constitutional:  Positive for activity change.  Neurological:  Positive for weakness.    Physical Exam Constitutional:      General: He is not in acute distress.    Appearance: He is ill-appearing.     Comments: Wakes easily to voice  Pulmonary:     Effort: Pulmonary effort is normal.  Skin:    General: Skin is warm and dry.  Neurological:     Mental Status: He is oriented to person, place, and time.     Vital Signs: BP 102/74   Pulse 77   Temp 97.7 F (36.5 C)   Resp (!) 25   Ht 6' (1.829 m)   Wt 88.5 kg   SpO2 100%   BMI 26.46 kg/m  Pain Scale: 0-10   Pain Score: 0-No pain   SpO2: SpO2: 100 % O2 Device:SpO2: 100 % O2 Flow Rate: .   IO: Intake/output summary:  Intake/Output Summary  (Last 24 hours) at 10/30/2023 1448 Last data filed at 10/30/2023 1400 Gross per 24 hour  Intake 9536.66 ml  Output 1035 ml  Net 8501.66 ml    LBM: Last BM Date : 10/30/23 Baseline Weight: Weight: 79.4 kg Most recent weight: Weight:  88.5 kg     Palliative Assessment/Data: PPS 30%     *Please note that this is a verbal dictation therefore any spelling or grammatical errors are due to the Dragon Medical One system interpretation.   Time Total: 60 minutes Time spent includes: Detailed review of medical records (labs, imaging, vital signs), medically appropriate exam, discussion with treatment team, counseling and educating patient, family and/or staff, documenting clinical information, medication management and coordination of care.    Tobey Jama Barnacle, DNP, AGNP-C Palliative Medicine Team [phone] Pager: [phone]

## 2023-10-30 NOTE — Consult Note (Addendum)
 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 and multidrug-resistant Pseudomonas aeruginosa that was only sensitive to gentamicin having been present along with Bacteroides and cultures from the Fournier's Prior positive urine culture with similarly resistant multidrug-resistant Pseudomonas at the same time he had the Fournier's IDDM  Plan:  Contnue avycaz  for now with flagy, and DC doxcycline F/u blood cultures, urine cultures and pleural fluid cultures At some point when and if he stabilizes more he will need a central line holiday given  it was placed in setting of his bacteremia Agree with palliative care consult Greatly appreciate CCM and Urology   Principal Problem:   Gram-negative bacteremia Active Problems:   Pleural effusion   Shock (HCC)   Scheduled Meds:  atorvastatin   40 mg Oral QHS   Chlorhexidine  Gluconate Cloth  6 each Topical Daily   feeding supplement  237 mL Oral TID BM   folic acid   1 mg Oral Daily   heparin   5,000 Units Subcutaneous Q8H   hydrocortisone  sodium succinate   100 mg Intravenous Q12H   insulin  aspart  0-9 Units Subcutaneous Q4H   insulin  glargine-yfgn  8 Units Subcutaneous BID   midodrine   10 mg Oral TID with meals   multivitamin  15 mL Oral Daily   thiamine   100 mg Oral Daily   Continuous Infusions:  ceftazidime -avibactam (AVYCAZ ) 1.25 g in dextrose  5 % 50 mL IVPB Stopped (10/30/23 0959)   norepinephrine  (LEVOPHED ) Adult infusion 7 mcg/min (10/30/23  1400)   PRN Meds:.docusate sodium , ondansetron  (ZOFRAN ) IV, mouth rinse, polyethylene glycol  HPI: Peter Yang is a 68 y.o. 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  He got gross hematuria and Foley was changed in the ER.  He was confused and hypotensive.  Blood cultures were taken in the ER with 2 sets taken that 1 was taken prior to initiation of antibiotics.  His confusion progressed in the ER and he was unable to answer questions.  He was started on Levophed  up to 30 mcg/min via peripheral IV and ultimately a central line was placed.  He is now in the ICU.  He has been seen by urology guarding his hematuria and they see no need for further workup.  Since replacement of urine catheter urine has become much less bloody.  Imaging included CT head without contrast showed remote white matter infarcts in the right corona radiata and stable remote lacunar infarct  X-ray independently reviewed and has small pleural effusion bilateral atelectatic changes CT abdomen pelvis showed mild bilateral hydronephrosis with no obstructive stones wall bladder collapsed with Foley in place with moderate to large bilateral pleural effusions right greater than left.  Patient is now status post diagnostic and therapeutic thoracentesis with the  fluid appearing to be largely transudative.    It is difficult to ascertain what type of symptoms the patient was having besides what was described as him being hot and the confusion that he had at [name].  He himself alert now but really cannot recall much about what happened prior to his admission to the  hospital.  He has improved with quite broad-spectrum antibiotics remains critically ill in the intensive care unit.  The blood culture that was taken prior to initiation of antibiotics is growing gram-negative rods.  Of note he has a history of having grown Pseudomonas aeruginosa that is resistant to everything except for gentamicin and urine cultures from August 14 as well as cultures from his Fournier's gangrene.  He also grew up cell pneumonia with an ESBL pattern with organism sensitive to fluoroquinolones carbapenem pip-tazo and Bactrim  as well as Bacteroides fragilis at that time.   Patient had been given cefepime  in the ER and then to avycaz , flagyl  and doxycyline.   I have personally spent 35 minutes involved in the Infectious Diseases evaluation and management of this ill patient with gram-negative bacteremia and sepsis requiring pressors with acute kidney injury and encephalopathy.   Additional time beyond that included with non-face-to-face activities for this patient on the day of the visit. Professional time spent includes the following activities: Preparing to see the patient (review of tests), Obtaining and/or reviewing separately obtained history (admission/discharge record), Performing a medically appropriate examination and/or evaluation , Ordering medications/tests/procedures, referring and communicating with other health care professionals, Documenting clinical information in the EMR, Independently interpreting results (not separately reported), Communicating results to the patient/family/caregiver, Counseling and educating the patient/family/caregiver and Care coordination (not separately reported).      Review of Systems: Review of Systems  Unable to perform ROS: Critical illness    Past Medical History:  Diagnosis Date   BPH with obstruction/lower urinary tract symptoms 02/02/2018   CAD (coronary artery disease)    Congenital talipes calcaneovarus, left foot  10/22/2019   Coronary artery disease involving native coronary artery of native heart without angina pectoris 06/25/2022   Diabetic polyneuropathy associated with type 2 diabetes mellitus (HCC) 07/28/2021   Essential hypertension 01/25/2016   Hematuria and +fecal occult  06/25/2022   History of diabetic ulcer of foot 12/31/2019   History of non-ST elevation myocardial infarction (NSTEMI) 06/15/2022   Hx of right coronary artery stent placement 02/11/2022   Hyperlipidemia    Ischemic cardiomyopathy    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 Overview:  Added automatically from request for surgery [id number] Formatting of this note might be different from the original. Added automatically from request for surgery [id number]   Large bowel perforation (HCC) 09/27/2015   Malignant neoplasm of prostate (HCC) 10/22/2019   Myopia with astigmatism and presbyopia, bilateral 06/15/2018   Last Assessment & Plan:  Formatting of this note might be different from the original. - Wrx printed Formatting of this note might be different from the original. 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 - Stable, monitor Formatting of this note might be different from  the original. Last Assessment & Plan:  - Stable, monitor   Retroperitoneal abscess (HCC) 10/02/2015   Status post corneal transplant 10/07/2016   Formatting of this note might be different from the original. Overview:  Added automatically from request for surgery [id number] Formatting of this note might be different from the original. Added automatically from request for surgery  [id number]  Last Assessment & Plan:  Formatting of this note might be different from the original. - OD, 2/2 KCN - Stable, no NVK / rejection - Continue off steroids - Mo   Thoracic ascending aortic aneurysm (HCC)    TIA (transient ischemic attack) 2012   Type 2 diabetes mellitus (HCC)     Social History   Tobacco Use   Smoking status: Never   Smokeless tobacco: Never  Substance Use Topics   Alcohol  use: Never   Drug use: Never    Family History  Problem Relation Age of Onset   Diabetes Mother    Hypertension Mother    Heart disease Mother    Cancer Father    Heart attack Sister    Cancer Sister    Diabetes Brother    Allergies  Allergen Reactions   Sglt2 Inhibitors Other (See Comments)    Fournier's gangrene    OBJECTIVE: Blood pressure 102/74, pulse 77, temperature 97.7 F (36.5 C), resp. rate (!) 25, height 6' (1.829 m), weight 88.5 kg, SpO2 100%.  Physical Exam Constitutional:      Appearance: Normal appearance. He is ill-appearing.  Cardiovascular:     Rate and Rhythm: Tachycardia present.     Heart sounds: No murmur heard.    No friction rub. No gallop.  Pulmonary:     Effort: Pulmonary effort is normal. No respiratory distress.     Breath sounds: No wheezing.  Abdominal:     General: There is no distension.     Tenderness: There is no abdominal tenderness.  Neurological:     Mental Status: He is alert and oriented to person, place, and time.  Psychiatric:        Attention and Perception: Attention normal.        Mood and Affect: Mood and affect normal.        Speech: Speech normal.        Behavior: Behavior normal.        Cognition and Memory: Cognition normal.        Judgment: Judgment normal.    Decubitus ulcer    Lab Results Lab Results  Component Value Date   WBC 36.6 (H) 10/30/2023   HGB 10.4 (L) 10/30/2023   HCT 31.2 (L) 10/30/2023   MCV 88.9 10/30/2023   PLT 325 10/30/2023    Lab Results  Component Value Date   CREATININE 1.36 (H)  10/30/2023   BUN 55 (H) 10/30/2023   NA 130 (L) 10/30/2023   K 3.8 10/30/2023   CL 96 (L) 10/30/2023   CO2 22 10/30/2023    Lab Results  Component Value Date   ALT 10 10/29/2023   AST 18 10/29/2023   ALKPHOS 99 10/29/2023   BILITOT 1.3 (H) 10/29/2023     Microbiology: Recent Results (from the past 240 hours)  Resp panel by RT-PCR (RSV, Flu A&B, Covid) Anterior Nasal Swab     Status: None   Collection Time: 10/29/23  2:09 PM   Specimen: Anterior Nasal Swab  Result Value Ref Range Status   SARS Coronavirus 2 by RT PCR NEGATIVE NEGATIVE Final   Influenza A  by PCR NEGATIVE NEGATIVE Final   Influenza B by PCR NEGATIVE NEGATIVE Final    Comment: (NOTE) The Xpert Xpress SARS-CoV-2/FLU/RSV plus assay is intended as an aid in the diagnosis of influenza from Nasopharyngeal swab specimens and should not be used as a sole basis for treatment. Nasal washings and aspirates are unacceptable for Xpert Xpress SARS-CoV-2/FLU/RSV testing.  Fact Sheet for Patients: bloggercourse.com  Fact Sheet for Healthcare Providers: seriousbroker.it  This test is not yet approved or cleared by the [place]  FDA and has been authorized for detection and/or diagnosis of SARS-CoV-2 by FDA under an Emergency Use Authorization (EUA). This EUA will remain in effect (meaning this test can be used) for the duration of the COVID-19 declaration under Section 564(b)(1) of the Act, 21 U.S.C. section 360bbb-3(b)(1), unless the authorization is terminated or revoked.     Resp Syncytial Virus by PCR NEGATIVE NEGATIVE Final    Comment: (NOTE) Fact Sheet for Patients: bloggercourse.com  Fact Sheet for Healthcare Providers: seriousbroker.it  This test is not yet approved or cleared by the [place]  FDA and has been authorized for detection and/or diagnosis of SARS-CoV-2 by FDA under an Emergency Use  Authorization (EUA). This EUA will remain in effect (meaning this test can be used) for the duration of the COVID-19 declaration under Section 564(b)(1) of the Act, 21 U.S.C. section 360bbb-3(b)(1), unless the authorization is terminated or revoked.  Performed at [facility], [address], [place], [place] [address]   Blood Culture (routine x 2)     Status: None (Preliminary result)   Collection Time: 10/29/23  2:14 PM   Specimen: BLOOD LEFT ARM  Result Value Ref Range Status   Specimen Description BLOOD LEFT ARM  Final   Special Requests   Final    BOTTLES DRAWN AEROBIC AND ANAEROBIC Blood Culture adequate volume   Culture  Setup Time   Final    GRAM NEGATIVE RODS ANAEROBIC BOTTLE ONLY Organism ID to follow Performed at [facility], [address] [place], [place] [address]    Culture GRAM NEGATIVE RODS  Final   Report Status PENDING  Incomplete  Blood Culture (routine x 2)     Status: None (Preliminary result)   Collection Time: 10/29/23  2:57 PM   Specimen: BLOOD LEFT ARM  Result Value Ref Range Status   Specimen Description BLOOD LEFT ARM  Final   Special Requests   Final    BOTTLES DRAWN AEROBIC AND ANAEROBIC Blood Culture results may not be optimal due to an inadequate volume of blood received in culture bottles   Culture   Final    NO GROWTH < 24 HOURS Performed at [facility], [address], [place], [place] [address]    Report Status PENDING  Incomplete  Respiratory (~20 pathogens) panel by PCR     Status: None   Collection Time: 10/29/23  5:31 PM   Specimen: Pleural; Respiratory  Result Value Ref Range Status   Adenovirus NOT DETECTED NOT DETECTED Final   Coronavirus 229E NOT DETECTED NOT DETECTED Final    Comment: (NOTE) The Coronavirus on the Respiratory Panel, DOES NOT test for the novel  Coronavirus (2019 nCoV)    Coronavirus HKU1 NOT DETECTED NOT DETECTED Final   Coronavirus NL63 NOT DETECTED NOT DETECTED Final   Coronavirus OC43  NOT DETECTED NOT DETECTED Final   Metapneumovirus NOT DETECTED NOT DETECTED Final   Rhinovirus / Enterovirus NOT DETECTED NOT DETECTED Final   Influenza A  NOT DETECTED NOT DETECTED Final   Influenza B NOT DETECTED NOT DETECTED Final   Parainfluenza Virus 1 NOT DETECTED NOT DETECTED Final   Parainfluenza Virus 2 NOT DETECTED NOT DETECTED Final   Parainfluenza Virus 3 NOT DETECTED NOT DETECTED Final   Parainfluenza Virus 4 NOT DETECTED NOT DETECTED Final   Respiratory Syncytial Virus NOT DETECTED NOT DETECTED Final   Bordetella pertussis NOT DETECTED NOT DETECTED Final   Bordetella Parapertussis NOT DETECTED NOT DETECTED Final   Chlamydophila pneumoniae NOT DETECTED NOT DETECTED Final   Mycoplasma pneumoniae NOT DETECTED NOT DETECTED Final    Comment: Performed at [facility], [address] [place], [place] [address]  Body fluid culture w Gram Stain     Status: None (Preliminary result)   Collection Time: 10/29/23  7:10 PM   Specimen: Pleural Fluid  Result Value Ref Range Status   Specimen Description PLEURAL  Final   Special Requests NONE  Final   Gram Stain   Final    WBC PRESENT,BOTH PMN AND MONONUCLEAR NO ORGANISMS SEEN CYTOSPIN SMEAR    Culture   Final    NO GROWTH < 24 HOURS Performed at [facility], [address], [place], [place] [address]    Report Status PENDING  Incomplete  MRSA Next Gen by PCR, Nasal     Status: Abnormal   Collection Time: 10/29/23  7:30 PM  Result Value Ref Range Status   MRSA by PCR Next Gen DETECTED (A) NOT DETECTED Final    Comment: RESULT CALLED TO, READ BACK BY AND VERIFIED WITH:  W. HUTCHES 10/29/2023 @ 2217 BY AB (NOTE) The GeneXpert MRSA Assay (FDA approved for NASAL specimens only), is one component of a comprehensive MRSA colonization surveillance program. It is not intended to diagnose MRSA infection nor to guide or monitor treatment for MRSA infections. Test performance is not FDA approved in patients less than 26  years old. Performed at [facility], [address], [place], [place] [address]     Jomarie Fleeta Rothman, MD [facility] [phone] pager  10/30/2023, 2:52 PM

## 2023-10-31 ENCOUNTER — Other Ambulatory Visit: Payer: Self-pay

## 2023-10-31 DIAGNOSIS — B961 Klebsiella pneumoniae [K. pneumoniae] as the cause of diseases classified elsewhere: Secondary | ICD-10-CM | POA: Diagnosis not present

## 2023-10-31 DIAGNOSIS — Z7189 Other specified counseling: Secondary | ICD-10-CM | POA: Diagnosis not present

## 2023-10-31 DIAGNOSIS — Z515 Encounter for palliative care: Secondary | ICD-10-CM | POA: Diagnosis not present

## 2023-10-31 DIAGNOSIS — A419 Sepsis, unspecified organism: Secondary | ICD-10-CM | POA: Diagnosis not present

## 2023-10-31 DIAGNOSIS — Z66 Do not resuscitate: Secondary | ICD-10-CM | POA: Diagnosis not present

## 2023-10-31 DIAGNOSIS — R6521 Severe sepsis with septic shock: Secondary | ICD-10-CM | POA: Diagnosis not present

## 2023-10-31 DIAGNOSIS — A499 Bacterial infection, unspecified: Secondary | ICD-10-CM

## 2023-10-31 DIAGNOSIS — G9340 Encephalopathy, unspecified: Secondary | ICD-10-CM | POA: Diagnosis not present

## 2023-10-31 DIAGNOSIS — R7881 Bacteremia: Secondary | ICD-10-CM | POA: Diagnosis not present

## 2023-10-31 LAB — GLUCOSE, CAPILLARY
Glucose-Capillary: 120 mg/dL — ABNORMAL HIGH (ref 70–99)
Glucose-Capillary: 140 mg/dL — ABNORMAL HIGH (ref 70–99)
Glucose-Capillary: 163 mg/dL — ABNORMAL HIGH (ref 70–99)
Glucose-Capillary: 163 mg/dL — ABNORMAL HIGH (ref 70–99)
Glucose-Capillary: 176 mg/dL — ABNORMAL HIGH (ref 70–99)
Glucose-Capillary: 254 mg/dL — ABNORMAL HIGH (ref 70–99)
Glucose-Capillary: 285 mg/dL — ABNORMAL HIGH (ref 70–99)
Glucose-Capillary: 544 mg/dL (ref 70–99)

## 2023-10-31 LAB — CBC
HCT: 26.8 % — ABNORMAL LOW (ref 39.0–52.0)
Hemoglobin: 8.8 g/dL — ABNORMAL LOW (ref 13.0–17.0)
MCH: 29.1 pg (ref 26.0–34.0)
MCHC: 32.8 g/dL (ref 30.0–36.0)
MCV: 88.7 fL (ref 80.0–100.0)
Platelets: 190 10*3/uL (ref 150–400)
RBC: 3.02 MIL/uL — ABNORMAL LOW (ref 4.22–5.81)
RDW: 17.5 % — ABNORMAL HIGH (ref 11.5–15.5)
WBC: 18.2 10*3/uL — ABNORMAL HIGH (ref 4.0–10.5)
nRBC: 0 % (ref 0.0–0.2)

## 2023-10-31 LAB — BASIC METABOLIC PANEL
Anion gap: 9 (ref 5–15)
BUN: 49 mg/dL — ABNORMAL HIGH (ref 8–23)
CO2: 23 mmol/L (ref 22–32)
Calcium: 7.8 mg/dL — ABNORMAL LOW (ref 8.9–10.3)
Chloride: 96 mmol/L — ABNORMAL LOW (ref 98–111)
Creatinine, Ser: 0.97 mg/dL (ref 0.61–1.24)
GFR, Estimated: >60 mL/min (ref >60)
Glucose, Bld: 243 mg/dL — ABNORMAL HIGH (ref 70–99)
Potassium: 3.9 mmol/L (ref 3.5–5.1)
Sodium: 128 mmol/L — ABNORMAL LOW (ref 135–145)

## 2023-10-31 LAB — LEGIONELLA PNEUMOPHILA SEROGP 1 UR AG: L. pneumophila Serogp 1 Ur Ag: NEGATIVE

## 2023-10-31 LAB — PHOSPHORUS: Phosphorus: 2.4 mg/dL — ABNORMAL LOW (ref 2.5–4.6)

## 2023-10-31 LAB — MAGNESIUM: Magnesium: 1.9 mg/dL (ref 1.7–2.4)

## 2023-10-31 MED ORDER — INSULIN ASPART 100 UNIT/ML IJ SOLN
0.0000 [IU] | INTRAMUSCULAR | Status: DC
  Administered 2023-10-31: 4 [IU] via SUBCUTANEOUS
  Administered 2023-10-31: 3 [IU] via SUBCUTANEOUS
  Administered 2023-11-01 (×2): 4 [IU] via SUBCUTANEOUS
  Administered 2023-11-01 (×3): 7 [IU] via SUBCUTANEOUS
  Administered 2023-11-02 (×2): 3 [IU] via SUBCUTANEOUS
  Administered 2023-11-03 – 2023-11-04 (×2): 4 [IU] via SUBCUTANEOUS
  Administered 2023-11-04: 3 [IU] via SUBCUTANEOUS
  Administered 2023-11-04: 4 [IU] via SUBCUTANEOUS
  Administered 2023-11-04 – 2023-11-05 (×3): 3 [IU] via SUBCUTANEOUS
  Administered 2023-11-05: 4 [IU] via SUBCUTANEOUS

## 2023-10-31 MED ORDER — HYDROCORTISONE 5 MG PO TABS
5.0000 mg | ORAL_TABLET | Freq: Two times a day (BID) | ORAL | Status: DC
  Administered 2023-11-01 – 2023-11-03 (×6): 5 mg via ORAL
  Filled 2023-10-31 (×7): qty 1

## 2023-10-31 MED ORDER — MAGNESIUM SULFATE 2 GM/50ML IV SOLN
2.0000 g | Freq: Once | INTRAVENOUS | Status: AC
  Administered 2023-10-31: 2 g via INTRAVENOUS
  Filled 2023-10-31: qty 50

## 2023-10-31 MED ORDER — ENOXAPARIN SODIUM 100 MG/ML IJ SOSY
1.0000 mg/kg | PREFILLED_SYRINGE | Freq: Two times a day (BID) | INTRAMUSCULAR | Status: DC
  Administered 2023-10-31 – 2023-11-02 (×6): 90 mg via SUBCUTANEOUS
  Filled 2023-10-31 (×8): qty 0.9

## 2023-10-31 MED ORDER — INSULIN ASPART 100 UNIT/ML IJ SOLN
20.0000 [IU] | Freq: Once | INTRAMUSCULAR | Status: AC
  Administered 2023-10-31: 20 [IU] via SUBCUTANEOUS

## 2023-10-31 MED ORDER — INSULIN ASPART 100 UNIT/ML IJ SOLN: 0.0000 [IU] | INTRAMUSCULAR | Status: DC

## 2023-10-31 MED ORDER — HYOSCYAMINE SULFATE 0.125 MG PO TBDP: 0.1250 mg | ORAL_TABLET | ORAL | Status: DC | PRN

## 2023-10-31 MED ORDER — ADULT MULTIVITAMIN W/MINERALS CH
1.0000 | ORAL_TABLET | Freq: Every day | ORAL | Status: DC
  Administered 2023-10-31 – 2023-11-05 (×5): 1 via ORAL
  Filled 2023-10-31 (×6): qty 1

## 2023-10-31 MED ORDER — MUPIROCIN 2 % EX OINT
1.0000 | TOPICAL_OINTMENT | Freq: Two times a day (BID) | CUTANEOUS | Status: AC
  Administered 2023-10-31 – 2023-11-05 (×10): 1 via NASAL
  Filled 2023-10-31 (×4): qty 22

## 2023-10-31 MED ORDER — INSULIN GLARGINE-YFGN 100 UNIT/ML ~~LOC~~ SOLN
12.0000 [IU] | Freq: Two times a day (BID) | SUBCUTANEOUS | Status: DC
Start: 2023-10-31 — End: 2023-11-02
  Administered 2023-10-31 – 2023-11-02 (×5): 12 [IU] via SUBCUTANEOUS
  Filled 2023-10-31 (×8): qty 0.12

## 2023-10-31 MED ORDER — POTASSIUM & SODIUM PHOSPHATES 280-160-250 MG PO PACK
1.0000 | PACK | Freq: Three times a day (TID) | ORAL | Status: AC
  Administered 2023-10-31 (×3): 1 via ORAL
  Filled 2023-10-31 (×4): qty 1

## 2023-10-31 MED ORDER — HYDROCORTISONE SOD SUC (PF) 100 MG IJ SOLR
100.0000 mg | INTRAMUSCULAR | Status: AC
  Administered 2023-10-31: 100 mg via INTRAVENOUS
  Filled 2023-10-31: qty 2

## 2023-10-31 NOTE — Progress Notes (Signed)
 [facility] ADULT ICU REPLACEMENT PROTOCOL   The patient does apply for the [facility] Adult ICU Electrolyte Replacment Protocol based on the criteria listed below:   1.Exclusion criteria: TCTS, ECMO, Dialysis, and Myasthenia Gravis patients 2. Is GFR >/= 30 ml/min? Yes.    Patient's GFR today is >60 3. Is SCr </= 2? Yes.   Patient's SCr is 0.97 mg/dL 4. Did SCr increase >/= 0.5 in 24 hours? No. 5.Pt's weight >40kg  Yes.   6. Abnormal electrolyte(s):   Mg 1.9`     7. Electrolytes replaced per protocol 8.  Call MD STAT for K+ </= 2.5, Phos </= 1, or Mag </= 1 Physician:  A. Paliwal  Lanore Renderos R Tomothy Eddins 10/31/2023 6:08 AM

## 2023-10-31 NOTE — Inpatient Diabetes Management (Signed)
 Inpatient Diabetes Program Recommendations  AACE/ADA: New Consensus Statement on Inpatient Glycemic Control (2025)  Target Ranges:  Prepandial:   less than 140 mg/dL      Peak postprandial:   less than 180 mg/dL (1-2 hours)      Critically ill patients:  140 - 180 mg/dL   Lab Results  Component Value Date   GLUCAP 163 (H) 10/31/2023   HGBA1C 6.0 (H) 10/29/2023    Review of Glycemic Control  Latest Reference Range & Units 10/30/23 20:35 10/31/23 00:14 10/31/23 04:38 10/31/23 08:37  Glucose-Capillary 70 - 99 mg/dL 667 (H) 714 (H) 745 (H) 163 (H)  Diabetes history: DM 2 Outpatient Diabetes medications:  Tresiba  23 units q HS, Humalog 0-15 units tid with meals Current orders for Inpatient glycemic control:  Novolog  0-9 units q 4 hours Semglee  8 units bid Solucortef 100 mg bid  Inpatient Diabetes Program Recommendations:    Consider increasing Novolog  correction to moderate q 4 hours and increase Semglee  to 12 units bid.   Thanks,  Randall Bullocks, RN, BC-ADM Inpatient Diabetes Coordinator Pager [phone]  (8a-5p)

## 2023-10-31 NOTE — Progress Notes (Signed)
 NAME:  Peter Yang, MRN:  [id number], DOB:  03/24/1956, LOS: 2 ADMISSION DATE:  10/29/2023, CONSULTATION DATE:  10/29/23 REFERRING MD:  Dr. Yolande, CHIEF COMPLAINT:  AMS   History of Present Illness:  Peter Yang is a 68 y/o gentleman with a history of debility requiring SNF/ rehab since BKA for necrotizing fasciitis, Fournier's gangrene, DM, chronic hypotension, ureterocutaneous fistula who presented with confusion, feeling hot, and hypotension from [place]. He has been treated for several days with ciprofloxacin for UTI. He had gross hematuria and had his foley changed in the ED. He was initially confused, but was started on an epi infusion by EMS with improvement in his mentation. In the ED he became more confused. He is currently unarousable and unable to answer questions or provide additional history. Per his emergency contact, he has always been able to make his own decisions. In the ED he had 1/2 blood cultures obtained before antibiotics due to difficult access. He was started on NE at 30mcg via PIV.    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 Overview:  Added automatically from request for surgery [id number]  Formatting of this note might be different from the original. Added automatically from request for surgery [id number]   Large bowel perforation (HCC) 09/27/2015   Malignant neoplasm of prostate (HCC) 10/22/2019   Myopia with astigmatism and presbyopia, bilateral 06/15/2018   Last Assessment & Plan:  Formatting of this note might be different from the original. - Wrx printed Formatting of this note might be different from the original. 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 - Stable, monitor Formatting of this note might be different from the original. Last Assessment & Plan:  - Stable, monitor   Retroperitoneal abscess (HCC) 10/02/2015   Status post corneal transplant 10/07/2016   Formatting of this note might be different from the original. Overview:  Added automatically from request for surgery [id number] Formatting of this note might be different from the original. Added automatically from request for surgery [id number]  Last Assessment & Plan:  Formatting of this note might be different from the original. - OD, 2/2 KCN - Stable, no NVK / rejection - Continue off steroids - Mo   Thoracic ascending aortic aneurysm (HCC)    TIA (transient ischemic attack) 2012   Type 2 diabetes mellitus (HCC)    Significant Hospital Events: Including procedures, antibiotic start and stop dates in  addition to other pertinent events   1/8 admission, given cefepime , flagyl , vanc in ED. Foley changed in ED. On peripiheral NE.  1/8 L [place] CVL placed, R thoracentesis with 1500cc fluid removed, transudative. Gram stain neg, Cultures and cytology pending.  Interim History / Subjective:    meropenem   (MERREM ) IV Stopped (10/31/23 0534)   norepinephrine  (LEVOPHED ) Adult infusion Stopped (10/31/23 0747)     Objective   Blood pressure 91/62, pulse 70, temperature 97.9 F (36.6 C), temperature source Bladder, resp. rate 12, height 6' (1.829 m), weight 90.3 kg, SpO2 99%. CVP:  [5 mmHg-20 mmHg] 10 mmHg      Intake/Output Summary (Last 24 hours) at 10/31/2023 0851 Last data filed at 10/31/2023 0800 Gross per 24 hour  Intake 2519.92 ml  Output 300 ml  Net 2219.92 ml   Filed Weights   10/29/23 1408 10/30/23 0500 10/31/23 0447  Weight: 79.4 kg 88.5 kg 90.3 kg   Examination: No distress much more awake Scattered petechiae vs bruising Globally weak Minimal anasarca Wound imaging  L BKA noted, stump okay  Blood with ESBL Klebsiella CT A/P reviewed  Assessment & Plan:   Septic shock; suspect urinary source/cystitis. There was initially some concern pulmonary/parapneumonic effusion but effusion is transudative with negative gram stain so far. Cultures and cyto pending. RVP negative. ?relative AI - 2/2 above, on chronic Cortef . Chronic hypotension on Midodrine . Hx PAF on Apixaban . Gross hematuria - improving. Chronic foley - s/p change in ED, some concern for fistulization Chronic hydronephrosis Acute metabolic encephalopathy due to sepsis - improving. CT head negative. AKI, pre-renal vs septic ATN. Hyponatremia. Hyperbilirubinemia, mild. Anemia- unsure how far off recent baseline he is Hx DM. 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 Dressing procedure/placement/frequency: Single layer of xeroform to the sacral wound; top with foam. Change xeroform daily, foam every 3 days  Prevalon boot to the right heel for offloading Foam to the right heel for protection Paint left plantar foot wound with betadine daily No topical care for the hand wound Foam for sacral wound is protecting the wound just proximal to it (Stage 1) Low air loss mattress for moisture management  and pressure redistribution while in the ICU, will need air mattress if DC to non ICU bed Turn and reposition per hospital policy Consult RD for wound healing   Re-challenge with lovenox  Wean stress steroids to PTA hydrocortisone  Continue PTA midodrine  OP f/u with urology in 1 mo, keep foley for now PT/OT PICC consult vs oral, final abx plan per ID Insulin  adjustments discussed with PharmD Should be okay for progressive; remaining issues:  - Final abx plan - PT/OT eval - AC challenge to assure no recurrence of hematuria - Cortef  wean - RD consult - Diabetes control  Best Practice (right click and Reselect all SmartList Selections daily)   Diet/type: diabetic DVT prophylaxis prophylactic heparin   Pressure ulcer(s): pressure ulcer assessment deferred  GI prophylaxis: N/A Lines: Central line Foley:  Yes, and it is still needed Code Status:  DNR Last date of multidisciplinary goals of care discussion [1/8 with POA [name] [place]]   Rolan Sharps MD PCCM

## 2023-10-31 NOTE — Progress Notes (Signed)
 Report called to 5w [employer]. Will transfer via bed. Patient with no complaints at this time.

## 2023-10-31 NOTE — Progress Notes (Addendum)
 Subjective: No new complaints   Antibiotics:  Anti-infectives (From admission, onward)    Start     Dose/Rate Route Frequency Ordered Stop   10/31/23 0600  meropenem  (MERREM ) 1 g in sodium chloride  0.9 % 100 mL IVPB        1 g 200 mL/hr over 30 Minutes Intravenous Every 8 hours 10/30/23 2252     10/29/23 2300  metroNIDAZOLE  (FLAGYL ) IVPB 500 mg  Status:  Discontinued        500 mg 100 mL/hr over 60 Minutes Intravenous Every 8 hours 10/29/23 1737 10/30/23 0958   10/29/23 1745  ceftazidime -avibactam (AVYCAZ ) 1.25 g in dextrose  5 % 50 mL IVPB  Status:  Discontinued        1.25 g 25 mL/hr over 2 Hours Intravenous Every 8 hours 10/29/23 1737 10/30/23 2243   10/29/23 1645  doxycycline  (VIBRAMYCIN ) 100 mg in dextrose  5 % 250 mL IVPB  Status:  Discontinued        100 mg 125 mL/hr over 120 Minutes Intravenous Every 12 hours 10/29/23 1642 10/30/23 0957   10/29/23 1430  vancomycin  (VANCOREADY) IVPB 1750 mg/350 mL        1,750 mg 175 mL/hr over 120 Minutes Intravenous  Once 10/29/23 1423 10/29/23 1827   10/29/23 1415  ceFEPIme  (MAXIPIME ) 2 g in sodium chloride  0.9 % 100 mL IVPB        2 g 200 mL/hr over 30 Minutes Intravenous  Once 10/29/23 1409 10/29/23 1552   10/29/23 1415  metroNIDAZOLE  (FLAGYL ) IVPB 500 mg        500 mg 100 mL/hr over 60 Minutes Intravenous  Once 10/29/23 1409 10/29/23 1645   10/29/23 1415  vancomycin  (VANCOCIN ) IVPB 1000 mg/200 mL premix  Status:  Discontinued        1,000 mg 200 mL/hr over 60 Minutes Intravenous  Once 10/29/23 1409 10/29/23 1423       Medications: Scheduled Meds:  ascorbic acid   500 mg Oral Daily   atorvastatin   40 mg Oral QHS   Chlorhexidine  Gluconate Cloth  6 each Topical Daily   enoxaparin  (LOVENOX ) injection  1 mg/kg Subcutaneous Q12H   feeding supplement  237 mL Oral TID BM   folic acid   1 mg Oral Daily   [START ON 11/01/2023] hydrocortisone   5 mg Oral BID   hydrocortisone  sodium succinate   100 mg Intravenous Q24H   insulin   aspart  0-20 Units Subcutaneous Q4H   insulin  glargine-yfgn  12 Units Subcutaneous BID   midodrine   10 mg Oral TID with meals   multivitamin with minerals  1 tablet Oral Daily   potassium & sodium phosphates   1 packet Oral TID WC & HS   thiamine   100 mg Oral Daily   zinc  sulfate (50mg  elemental zinc )  220 mg Oral Daily   Continuous Infusions:  magnesium  sulfate bolus IVPB 2 g (10/31/23 1348)   meropenem  (MERREM ) IV Stopped (10/31/23 0534)   norepinephrine  (LEVOPHED ) Adult infusion Stopped (10/31/23 0747)   PRN Meds:.acetaminophen , docusate sodium , hyoscyamine , ondansetron  (ZOFRAN ) IV, mouth rinse, polyethylene glycol    Objective: Weight change: 10.9 kg  Intake/Output Summary (Last 24 hours) at 10/31/2023 1410 Last data filed at 10/31/2023 0800 Gross per 24 hour  Intake 984.06 ml  Output 75 ml  Net 909.06 ml   Blood pressure (!) 93/57, pulse 65, temperature (!) 97.3 F (36.3 C), resp. rate (!) 26, height 6' (1.829 m), weight 90.3 kg, SpO2 100%. Temp:  [97.3 F (  36.3 C)-98.4 F (36.9 C)] 97.3 F (36.3 C) (01/10 1200) Pulse Rate:  [64-87] 65 (01/10 1200) Resp:  [8-29] 26 (01/10 1200) BP: (91-114)/(55-102) 93/57 (01/10 1200) SpO2:  [98 %-100 %] 100 % (01/10 1200) Weight:  [90.3 kg] 90.3 kg (01/10 0447)  Physical Exam: Physical Exam Constitutional:      Appearance: He is well-developed.  HENT:     Head: Normocephalic and atraumatic.  Eyes:     Conjunctiva/sclera: Conjunctivae normal.  Cardiovascular:     Rate and Rhythm: Normal rate and regular rhythm.  Pulmonary:     Effort: Pulmonary effort is normal. No respiratory distress.     Breath sounds: No wheezing.  Abdominal:     General: There is no distension.     Palpations: Abdomen is soft.  Musculoskeletal:        General: Normal range of motion.     Cervical back: Normal range of motion and neck supple.  Skin:    General: Skin is warm and dry.     Findings: No erythema or rash.  Neurological:     General: No  focal deficit present.     Mental Status: He is alert and oriented to person, place, and time.  Psychiatric:        Mood and Affect: Mood normal.        Behavior: Behavior normal.        Thought Content: Thought content normal.        Judgment: Judgment normal.      CBC:    BMET Recent Labs    10/30/23 0403 10/31/23 0429  NA 130* 128*  K 3.8 3.9  CL 96* 96*  CO2 22 23  GLUCOSE 290* 243*  BUN 55* 49*  CREATININE 1.36* 0.97  CALCIUM  7.9* 7.8*     Liver Panel  Recent Labs    10/29/23 1457 10/29/23 1925  PROT 5.7* 5.4*  ALBUMIN  2.1*  --   AST 18  --   ALT 10  --   ALKPHOS 99  --   BILITOT 1.3*  --        Sedimentation Rate No results for input(s): ESRSEDRATE in the last 72 hours. C-Reactive Protein No results for input(s): CRP in the last 72 hours.  Micro Results: Recent Results (from the past 720 hours)  Resp panel by RT-PCR (RSV, Flu A&B, Covid) Anterior Nasal Swab     Status: None   Collection Time: 10/29/23  2:09 PM   Specimen: Anterior Nasal Swab  Result Value Ref Range Status   SARS Coronavirus 2 by RT PCR NEGATIVE NEGATIVE Final   Influenza A by PCR NEGATIVE NEGATIVE Final   Influenza B by PCR NEGATIVE NEGATIVE Final    Comment: (NOTE) The Xpert Xpress SARS-CoV-2/FLU/RSV plus assay is intended as an aid in the diagnosis of influenza from Nasopharyngeal swab specimens and should not be used as a sole basis for treatment. Nasal washings and aspirates are unacceptable for Xpert Xpress SARS-CoV-2/FLU/RSV testing.  Fact Sheet for Patients: bloggercourse.com  Fact Sheet for Healthcare Providers: seriousbroker.it  This test is not yet approved or cleared by the [place]  FDA and has been authorized for detection and/or diagnosis of SARS-CoV-2 by FDA under an Emergency Use Authorization (EUA). This EUA will remain in effect (meaning this test can be used) for the duration of the COVID-19  declaration under Section 564(b)(1) of the Act, 21 U.S.C. section 360bbb-3(b)(1), unless the authorization is terminated or revoked.     Resp Syncytial Virus  by PCR NEGATIVE NEGATIVE Final    Comment: (NOTE) Fact Sheet for Patients: bloggercourse.com  Fact Sheet for Healthcare Providers: seriousbroker.it  This test is not yet approved or cleared by the [place]  FDA and has been authorized for detection and/or diagnosis of SARS-CoV-2 by FDA under an Emergency Use Authorization (EUA). This EUA will remain in effect (meaning this test can be used) for the duration of the COVID-19 declaration under Section 564(b)(1) of the Act, 21 U.S.C. section 360bbb-3(b)(1), unless the authorization is terminated or revoked.  Performed at [facility], [address], [place], [place] [address]   Blood Culture (routine x 2)     Status: Abnormal (Preliminary result)   Collection Time: 10/29/23  2:14 PM   Specimen: BLOOD LEFT ARM  Result Value Ref Range Status   Specimen Description BLOOD LEFT ARM  Final   Special Requests   Final    BOTTLES DRAWN AEROBIC AND ANAEROBIC Blood Culture adequate volume   Culture  Setup Time   Final    GRAM NEGATIVE RODS IN BOTH AEROBIC AND ANAEROBIC BOTTLES CRITICAL RESULT CALLED TO, READ BACK BY AND VERIFIED WITH: PHARMD ANDREW MEYER ON 10/30/23 @ 1608 BY DRT    Culture (A)  Final    KLEBSIELLA PNEUMONIAE SUSCEPTIBILITIES TO FOLLOW Performed at [facility], [address], [place], [place] [address]    Report Status PENDING  Incomplete  Blood Culture ID Panel (Reflexed)     Status: Abnormal   Collection Time: 10/29/23  2:14 PM  Result Value Ref Range Status   Enterococcus faecalis NOT DETECTED NOT DETECTED Final   Enterococcus Faecium NOT DETECTED NOT DETECTED Final   Listeria monocytogenes NOT DETECTED NOT DETECTED Final   Staphylococcus species NOT DETECTED NOT DETECTED Final    Staphylococcus aureus (BCID) NOT DETECTED NOT DETECTED Final   Staphylococcus epidermidis NOT DETECTED NOT DETECTED Final   Staphylococcus lugdunensis NOT DETECTED NOT DETECTED Final   Streptococcus species NOT DETECTED NOT DETECTED Final   Streptococcus agalactiae NOT DETECTED NOT DETECTED Final   Streptococcus pneumoniae NOT DETECTED NOT DETECTED Final   Streptococcus pyogenes NOT DETECTED NOT DETECTED Final   A.calcoaceticus-baumannii NOT DETECTED NOT DETECTED Final   Bacteroides fragilis NOT DETECTED NOT DETECTED Final   Enterobacterales DETECTED (A) NOT DETECTED Final    Comment: Enterobacterales represent a large order of gram negative bacteria, not a single organism. CRITICAL RESULT CALLED TO, READ BACK BY AND VERIFIED WITH: PHARMD ANDREW MEYER ON 10/30/23 @ 1608 BY DRT    Enterobacter cloacae complex NOT DETECTED NOT DETECTED Final   Escherichia coli NOT DETECTED NOT DETECTED Final   Klebsiella aerogenes NOT DETECTED NOT DETECTED Final   Klebsiella oxytoca NOT DETECTED NOT DETECTED Final   Klebsiella pneumoniae DETECTED (A) NOT DETECTED Final    Comment: CRITICAL RESULT CALLED TO, READ BACK BY AND VERIFIED WITH: PHARMD ANDREW MEYER ON 10/30/23 @ 1608 BY DRT    Proteus species NOT DETECTED NOT DETECTED Final   Salmonella species NOT DETECTED NOT DETECTED Final   Serratia marcescens NOT DETECTED NOT DETECTED Final   Haemophilus influenzae NOT DETECTED NOT DETECTED Final   Neisseria meningitidis NOT DETECTED NOT DETECTED Final   Pseudomonas aeruginosa NOT DETECTED NOT DETECTED Final   Stenotrophomonas maltophilia NOT DETECTED NOT DETECTED Final   Candida albicans NOT DETECTED NOT DETECTED Final   Candida auris NOT DETECTED NOT DETECTED Final   Candida glabrata NOT DETECTED NOT DETECTED Final   Candida krusei NOT DETECTED NOT DETECTED  Final   Candida parapsilosis NOT DETECTED NOT DETECTED Final   Candida tropicalis NOT DETECTED NOT DETECTED Final   Cryptococcus neoformans/gattii  NOT DETECTED NOT DETECTED Final   CTX-M ESBL DETECTED (A) NOT DETECTED Final    Comment: CRITICAL RESULT CALLED TO, READ BACK BY AND VERIFIED WITH: PHARMD ANDREW MEYER ON 10/30/23 @ 1608 BY DRT (NOTE) Extended spectrum beta-lactamase detected. Recommend a carbapenem as initial therapy.      Carbapenem resistance IMP NOT DETECTED NOT DETECTED Final   Carbapenem resistance KPC NOT DETECTED NOT DETECTED Final   Carbapenem resistance NDM NOT DETECTED NOT DETECTED Final   Carbapenem resist OXA 48 LIKE NOT DETECTED NOT DETECTED Final   Carbapenem resistance VIM NOT DETECTED NOT DETECTED Final    Comment: Performed at [facility], [address] [place], [place] [address]  Blood Culture (routine x 2)     Status: None (Preliminary result)   Collection Time: 10/29/23  2:57 PM   Specimen: BLOOD LEFT ARM  Result Value Ref Range Status   Specimen Description BLOOD LEFT ARM  Final   Special Requests   Final    BOTTLES DRAWN AEROBIC AND ANAEROBIC Blood Culture results may not be optimal due to an inadequate volume of blood received in culture bottles   Culture   Final    NO GROWTH 2 DAYS Performed at [facility], [address] [place], [place] [address]    Report Status PENDING  Incomplete  Respiratory (~20 pathogens) panel by PCR     Status: None   Collection Time: 10/29/23  5:31 PM   Specimen: Pleural; Respiratory  Result Value Ref Range Status   Adenovirus NOT DETECTED NOT DETECTED Final   Coronavirus 229E NOT DETECTED NOT DETECTED Final    Comment: (NOTE) The Coronavirus on the Respiratory Panel, DOES NOT test for the novel  Coronavirus (2019 nCoV)    Coronavirus HKU1 NOT DETECTED NOT DETECTED Final   Coronavirus NL63 NOT DETECTED NOT DETECTED Final   Coronavirus OC43 NOT DETECTED NOT DETECTED Final   Metapneumovirus NOT DETECTED NOT DETECTED Final   Rhinovirus / Enterovirus NOT DETECTED NOT DETECTED Final   Influenza A NOT DETECTED NOT DETECTED Final   Influenza B  NOT DETECTED NOT DETECTED Final   Parainfluenza Virus 1 NOT DETECTED NOT DETECTED Final   Parainfluenza Virus 2 NOT DETECTED NOT DETECTED Final   Parainfluenza Virus 3 NOT DETECTED NOT DETECTED Final   Parainfluenza Virus 4 NOT DETECTED NOT DETECTED Final   Respiratory Syncytial Virus NOT DETECTED NOT DETECTED Final   Bordetella pertussis NOT DETECTED NOT DETECTED Final   Bordetella Parapertussis NOT DETECTED NOT DETECTED Final   Chlamydophila pneumoniae NOT DETECTED NOT DETECTED Final   Mycoplasma pneumoniae NOT DETECTED NOT DETECTED Final    Comment: Performed at [facility], [address], [place], [place] [address]  Body fluid culture w Gram Stain     Status: None (Preliminary result)   Collection Time: 10/29/23  7:10 PM   Specimen: Pleural Fluid  Result Value Ref Range Status   Specimen Description PLEURAL  Final   Special Requests NONE  Final   Gram Stain   Final    WBC PRESENT,BOTH PMN AND MONONUCLEAR NO ORGANISMS SEEN CYTOSPIN SMEAR    Culture   Final    NO GROWTH 2 DAYS Performed at [facility], [address] [place], [place] [address]    Report Status PENDING  Incomplete  MRSA Next Gen by  PCR, Nasal     Status: Abnormal   Collection Time: 10/29/23  7:30 PM  Result Value Ref Range Status   MRSA by PCR Next Gen DETECTED (A) NOT DETECTED Final    Comment: RESULT CALLED TO, READ BACK BY AND VERIFIED WITH:  W. HUTCHES 10/29/2023 @ 2217 BY AB (NOTE) The GeneXpert MRSA Assay (FDA approved for NASAL specimens only), is one component of a comprehensive MRSA colonization surveillance program. It is not intended to diagnose MRSA infection nor to guide or monitor treatment for MRSA infections. Test performance is not FDA approved in patients less than 24 years old. Performed at [facility], [address] [place], [place] [address]     Studies/Results: US  EKG SITE RITE Result Date: 10/31/2023 If Site Rite image not attached, placement could not be  confirmed due to current cardiac rhythm.  US  RENAL Result Date: 10/30/2023 CLINICAL DATA:  Hematuria EXAM: RENAL / URINARY TRACT ULTRASOUND COMPLETE COMPARISON:  CT 10/29/2023. FINDINGS: Right Kidney: Renal measurements: 11.6 x 5.4 x 5.2 cm = volume: 170.4 mL. Echogenicity within normal limits. No mass or hydronephrosis visualized. Previous collecting system dilatation is no longer seen. Left Kidney: Renal measurements: 10.7 x 6.0 x 5.3 cm = volume: 178.0 mL. Echogenicity within normal limits. No mass or hydronephrosis visualized. No collecting system dilatation. Bladder: Contracted with a Foley catheter.  There is some wall thickening. Other: Right-sided pleural effusion identified. Fatty liver infiltration. Distended gallbladder with sludge. Overall study is significantly limited by overlapping bowel gas and soft tissue. Particularly of the left kidney IMPRESSION: Limited examination. No collecting system dilatation seen on the current ultrasound. This has improved from prior CT. Foley catheter in the bladder. Note is made of a right pleural effusion. Distended gallbladder with sludge. Further workup as clinically appropriate Electronically Signed   By: Ranell Bring M.D.   On: 10/30/2023 14:00   DG CHEST PORT 1 VIEW Result Date: 10/29/2023 CLINICAL DATA:  Status post left subclavian central line placement EXAM: PORTABLE CHEST 1 VIEW COMPARISON:  Film from earlier in the same day. FINDINGS: Cardiac shadow is stable. New left subclavian central line is noted with the catheter tip at the cavoatrial junction. Pleural effusions are noted right greater than left. No focal confluent infiltrate is seen. No bony abnormality is noted. No pneumothorax is seen. IMPRESSION: No pneumothorax following central line placement. Electronically Signed   By: Oneil Devonshire M.D.   On: 10/29/2023 20:52   CT ABDOMEN PELVIS W CONTRAST Result Date: 10/29/2023 CLINICAL DATA:  Nausea, vomiting EXAM: CT ABDOMEN AND PELVIS WITH CONTRAST  TECHNIQUE: Multidetector CT imaging of the abdomen and pelvis was performed using the standard protocol following bolus administration of intravenous contrast. RADIATION DOSE REDUCTION: This exam was performed according to the departmental dose-optimization program which includes automated exposure control, adjustment of the mA and/or kV according to patient size and/or use of iterative reconstruction technique. CONTRAST:  75mL OMNIPAQUE  IOHEXOL  350 MG/ML SOLN COMPARISON:  06/04/2023 FINDINGS: Lower chest: Coronary artery and aortic atherosclerosis. Moderate to large bilateral pleural effusions, right larger than left. Compressive atelectasis in the lower lobes. Hepatobiliary: Small layering gallstones within the gallbladder. No focal hepatic abnormality or biliary ductal dilatation. Pancreas: No focal abnormality or ductal dilatation. Spleen: No focal abnormality.  Normal size. Adrenals/Urinary Tract: Mild bilateral hydronephrosis. Foley catheter present in the bladder and bladder is decompressed, but bladder wall appears thickened. No ureteral stones. Adrenal glands unremarkable. Stomach/Bowel: Stomach, large and small bowel grossly unremarkable. Vascular/Lymphatic: Aortic atherosclerosis.  No evidence of aneurysm or adenopathy. Reproductive: No visible focal abnormality. Other: Trace free fluid in the pelvis.  No free air. Musculoskeletal: No acute bony abnormality. Edema throughout the subcutaneous soft tissues compatible with anasarca. IMPRESSION: Mild bilateral hydronephrosis to the level of the bladder. No obstructing stones present. Wall the bladder is collapsed with Foley catheter in place, bladder wall appears thickened. This could be related to chronic bladder outlet obstruction or cystitis. Moderate to large bilateral pleural effusions, right greater than left. Compressive atelectasis in the lower lobes. Coronary artery disease, aortic atherosclerosis. Anasarca like edema throughout the soft tissues.  Electronically Signed   By: Franky Crease M.D.   On: 10/29/2023 19:28   CT HEAD WO CONTRAST ( ) Result Date: 10/29/2023 CLINICAL DATA:  Encephalopathy. EXAM: CT HEAD WITHOUT CONTRAST TECHNIQUE: Contiguous axial images were obtained from the base of the skull through the vertex without intravenous contrast. RADIATION DOSE REDUCTION: This exam was performed according to the departmental dose-optimization program which includes automated exposure control, adjustment of the mA and/or kV according to patient size and/or use of iterative reconstruction technique. 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 Remote lacunar infarct in the right globus pallidus is stable no acute infarct, hemorrhage, or mass lesion is present. Asymmetric ex vacuo dilation of the right lateral ventricle is noted. No significant extraaxial fluid collection is present. The brainstem and cerebellum are within normal limits. Midline structures are within normal limits. Vascular: Atherosclerotic calcifications are present within the cavernous internal carotid arteries bilaterally and at the dural margin vertebral arteries. No hyperdense vessel is present. Skull: Calvarium is intact. No focal lytic or blastic lesions are present. No significant extracranial soft tissue lesion is present. Sinuses/Orbits: Right lens replacement is present. Globes and orbits are otherwise within normal limits. The paranasal sinuses and mastoid air cells are clear. IMPRESSION: 1. No acute intracranial abnormality or significant interval change. 2. 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 3. Stable remote lacunar infarct in the right globus pallidus. Electronically Signed   By: Lonni Necessary M.D.   On:  10/29/2023 18:34   DG Chest Port 1 View Result Date: 10/29/2023 CLINICAL DATA:  Sepsis.  Evaluate for abnormality. EXAM: PORTABLE CHEST 1 VIEW COMPARISON:  06/11/2023. FINDINGS: Low lung volume. There is at least small right pleural effusion with probable associated compressive atelectatic changes and/or consolidation in the right lung base. There is also left retrocardiac airspace opacity obscuring the left hemidiaphragm, descending thoracic aorta and blunting the left lateral costophrenic angle, suggesting combination of left lung atelectasis and/or consolidation with pleural effusion. Bilateral lung fields are clear. No pneumothorax. Stable cardio-mediastinal silhouette. No acute osseous abnormalities. The soft tissues are within normal limits. IMPRESSION: Small right pleural effusion probable bilateral small pleural effusions with associated compressive atelectatic changes and/or consolidation. Correlate clinically. Electronically Signed   By: Ree Molt M.D.   On: 10/29/2023 15:27      Assessment/Plan:  INTERVAL HISTORY: ESBL Klebsiella pneumonia grew from blood culture   Principal Problem:   Gram-negative bacteremia Active Problems:   Pleural effusion   Shock (HCC)   Protein-calorie malnutrition, severe    Peter Yang is a 68 y.o. 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 history of Fournier's gangrene with multidrug-resistant ESBL pattern Klebsiella  pneumonia and multidrug-resistant Pseudomonas aeruginosa as well as and Bacteroides, insulin -dependent diabetes mellitus who was admitted with septic shock due to gram-negative bacteremia now found to have ESBL bacteremia  #1 ESBL bacteremia:  Continue meropenem   Followup sensis on Klebsiella PNA--the last species he grew was S to bactrim  though I would not be that enthusiastic about bactrim  given risk of cr and K elevation  He should regardless  have a 7 day course  Central line was exchaned with PICC when he was bacteremic but this may not necessarily have seeded his line. As long as he does NOT keep a central line I would not insiste on a catheter holiday  #2 Hematuria: resolving   I have personally spent 53 minutes involved in face-to-face and non-face-to-face activities for this patient on the day of the visit. Professional time spent includes the following activities: Preparing to see the patient (review of tests), Obtaining and/or reviewing separately obtained history (admission/discharge record), Performing a medically appropriate examination and/or evaluation , Ordering medications/tests/procedures, referring and communicating with other health care professionals, Documenting clinical information in the EMR, Independently interpreting results (not separately reported), Communicating results to the patient/family/caregiver, Counseling and educating the patient/family/caregiver and Care coordination (not separately reported).    Since it appears that charting out a 7 day course of carbapenem therapy  is current plan I will sign off for now. Please call for further questions.   LOS: 2 days   Jomarie Fleeta Rothman 10/31/2023, 2:10 PM

## 2023-10-31 NOTE — Progress Notes (Signed)
 Daily Progress Note   Patient Name: Peter Yang       Date: 10/31/2023 DOB: 06-Aug-1956  Age: 68 y.o. MRN#: [id number] Attending Physician: Claudene Toribio BROCKS, MD Primary Care Physician: Patient, No Pcp Per Admit Date: 10/29/2023  Reason for Consultation/Follow-up: Establishing goals of care  Subjective: Feeling better  Length of Stay: 2  Current Medications: Scheduled Meds:   ascorbic acid   500 mg Oral Daily   atorvastatin   40 mg Oral QHS   Chlorhexidine  Gluconate Cloth  6 each Topical Daily   enoxaparin  (LOVENOX ) injection  1 mg/kg Subcutaneous Q12H   feeding supplement  237 mL Oral TID BM   folic acid   1 mg Oral Daily   [START ON 11/01/2023] hydrocortisone   5 mg Oral BID   hydrocortisone  sodium succinate   100 mg Intravenous Q24H   insulin  aspart  0-15 Units Subcutaneous Q4H   insulin  glargine-yfgn  12 Units Subcutaneous BID   midodrine   10 mg Oral TID with meals   multivitamin with minerals  1 tablet Oral Daily   potassium & sodium phosphates   1 packet Oral TID WC & HS   thiamine   100 mg Oral Daily   zinc  sulfate (50mg  elemental zinc )  220 mg Oral Daily    Continuous Infusions:  meropenem  (MERREM ) IV Stopped (10/31/23 0534)   norepinephrine  (LEVOPHED ) Adult infusion Stopped (10/31/23 0747)    PRN Meds: acetaminophen , docusate sodium , ondansetron  (ZOFRAN ) IV, mouth rinse, polyethylene glycol  Physical Exam Constitutional:      General: He is not in acute distress.    Appearance: He is ill-appearing.  Cardiovascular:     Rate and Rhythm: Normal rate.  Pulmonary:     Effort: Pulmonary effort is normal.  Skin:    General: Skin is warm and dry.  Neurological:     Mental Status: He is alert and oriented to person, place, and time.  Psychiatric:        Mood and Affect: Affect is  flat.             Vital Signs: BP 91/62 (BP Location: Right Arm)   Pulse 70   Temp 97.9 F (36.6 C) (Bladder)   Resp 12   Ht 6' (1.829 m)   Wt 90.3 kg   SpO2 99%   BMI 27.00 kg/m  SpO2: SpO2: 99 % O2 Device: O2 Device: Room Air O2 Flow Rate:    Intake/output summary:  Intake/Output Summary (Last 24 hours) at 10/31/2023 1008 Last data filed at 10/31/2023 0800 Gross per 24 hour  Intake 2040.74 ml  Output 225 ml  Net 1815.74 ml   LBM: Last BM Date : 10/30/23 Baseline Weight: Weight: 79.4 kg Most recent weight: Weight: 90.3 kg       Palliative Assessment/Data: PPS 30%      Patient Active Problem List   Diagnosis Date Noted   Gram-negative bacteremia 10/30/2023   Protein-calorie malnutrition, severe 10/30/2023   Shock (HCC) 10/29/2023   History of MDR Pseudomonas aeruginosa infection 06/07/2023   Malnutrition of moderate degree 06/06/2023   Wound dehiscence in puerperium, perineal 06/04/2023   AMS (altered mental status) 05/07/2023   Seizure-like activity (HCC) 05/06/2023   Altered mental status 05/06/2023  Fournier's gangrene in male 04/02/2023   Soft tissue infection 04/02/2023   Necrotizing soft tissue infection 04/02/2023   UTI (urinary tract infection) due to urinary indwelling catheter (HCC) 04/01/2023   S/P BKA (below knee amputation) unilateral, left (HCC) 04/01/2023   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) 04/01/2023   Long term current use of systemic steroids 04/01/2023   Thrombocytopenia (HCC) 10/01/2022   Orthostatic hypotension 09/24/2022   HFrEF (heart failure with reduced ejection fraction) (HCC) 09/23/2022   Counseling and coordination of care 09/15/2022   Pain 09/14/2022   High risk medication use 09/14/2022   Need for emotional support 09/12/2022   Mood altered 09/11/2022   Medication management 09/11/2022   Palliative care by specialist 09/08/2022   Goals of care, counseling/discussion 09/08/2022   Physical  deconditioning 09/05/2022   Gross hematuria 08/31/2022   Type 2 diabetes mellitus with diabetic peripheral angiopathy without gangrene, without long-term current use of insulin  (HCC)    Impaired functional mobility, balance, gait, and endurance 08/03/2022   Obstructive sleep apnea syndrome 08/03/2022   Pleural effusion 08/03/2022   Catheter-associated urinary tract infection (HCC) 08/03/2022   History of urinary retention 08/03/2022   Mitral valve regurgitation 08/03/2022   Thoracic ascending aortic aneurysm (HCC) 07/23/2022   Borderline low blood pressure determined by examination 07/04/2022   Coronary artery disease due to lipid rich plaque 06/25/2022   Hematuria 06/25/2022   History of non-ST elevation myocardial infarction (NSTEMI) 06/15/2022   PAF (paroxysmal atrial fibrillation) (HCC) 06/15/2022   Dyslipidemia 06/15/2022   PVD (peripheral vascular disease) (HCC) 06/15/2022   Pressure ulcer of sacral region, stage 2 (HCC) 06/15/2022   Chronic HFrEF (heart failure with reduced ejection fraction) (HCC) 06/15/2022   DNR (do not resuscitate) 06/15/2022   Ischemic cardiomyopathy 02/12/2022   Need for assistance with personal care 08/03/2021   Diabetic polyneuropathy associated with type 2 diabetes mellitus (HCC) 07/28/2021   Prolonged QT interval 07/28/2021   Moderate protein-calorie malnutrition (HCC) 06/29/2021   Chronic insomnia 03/31/2021   BPH with obstruction/lower urinary tract symptoms 02/02/2018   Hyperlipidemia 04/15/2016   Vitamin D  deficiency 04/09/2016   Essential hypertension 01/25/2016    Palliative Care Assessment & Plan   HPI: 68 y.o. male  with past medical history of Fournier's gangrene, DM, chronic hypotension, ureterocutaneous fistula, and history of debility requiring SNF/ rehab since BKA for necrotizing fasciitis admitted on 10/29/2023 with septic shock - suspected urinary source. Patient with hypotension requiring vasopressors. PMT consulted to discuss GOC.     Assessment: Peter Yang reports feeling better today. RN reports improvement - off vasopressors as of this morning.   Peter Yang recalls our conversation from yesterday. We revisit goals of care. We discuss that his goals are inconsistent with the MOST form we have on file for him which stated comfort measures. He confirms this is no longer in line with his wishes.   He tells me he is considering his code status as his friend Dickey doesn't like the DNR. Encouraged him to consider DNR/DNI status understanding evidenced based poor outcomes in similar hospitalized patients, as the cause of the arrest is likely associated with chronic/terminal disease rather than a reversible acute cardio-pulmonary event. We review that he has had a DNR in place for a while now. He shares he is confident he would not want to be on a ventilator. We discuss if he required CPR he would likely require ventilator support and so DNR may be most appropriate; he  agrees with this.    I completed an updated MOST form today. The patient outlined his wishes for the following treatment decisions:  Cardiopulmonary Resuscitation: Do Not Attempt Resuscitation (DNR/No CPR)  Medical Interventions: Limited Additional Interventions: Use medical treatment, IV fluids and cardiac monitoring as indicated, DO NOT USE intubation or mechanical ventilation. May consider use of less invasive airway support such as BiPAP or CPAP. Also provide comfort measures. Transfer to the hospital if indicated. Avoid intensive care.   Antibiotics: Antibiotics if indicated  IV Fluids: IV fluids if indicated  Feeding Tube: No feeding tube   Also reviewed with Peter Heber that his current advance directive on file does not designate HCPOA. He is unsure of this as he doesn't want other making decisions for him - we review that the HCPOA only becomes decision maker if he is unable to make decisions for himself. We review designated someone as HCPOA that he  trusts would make decisions for him that he would make for himself. He confirms he would want his friend Dickey to be this person and agrees to complete HCPOA documentation - will request support with this document.   He asks that I call his friend Dickey to review his current status and his wishes regarding medical care. Spoke with Dickey regarding above - reviewed his wishes as reflected on MOST form, reviewed he was naming her as HCPOA, provided medical update. We reviewed HCPOA serves to speak for patient and share their wishes for themselves. All questions answered.    Recommendations/Plan: MOST completed as above DNR/DNI confirmed Requested support to complete HCPOA document to name friend [name] as [place] [phone]  Dickey updated on situation PMT will follow intermittently as goals seem quite clear at this point - please call if needs arise Outpatient palliative at SNF  Code Status: DNR  Discharge Planning: Skilled Nursing Facility for rehab with Palliative care service follow-up  Care plan was discussed with patient and friend Dickey, also discussed with RN  Thank you for allowing the Palliative Medicine Team to assist in the care of this patient.   Total Time 65 minutes Prolonged Time Billed  yes   Time spent includes: Detailed review of medical records (labs, imaging, vital signs), medically appropriate exam, discussion with treatment team, counseling and educating patient, family and/or staff, documenting clinical information, medication management and coordination of care.     *Please note that this is a verbal dictation therefore any spelling or grammatical errors are due to the Dragon Medical One system interpretation.  Tobey Jama Barnacle, DNP, [facility] Palliative Medicine Team Team Phone # [phone]  Pager [phone]

## 2023-10-31 NOTE — Progress Notes (Signed)
 This chaplain responded to PMT NP-Shae consult for updating the Pt. Advance Directive. The Pt. accepted the chaplain's visit and review of existing AD. Through the AD education the chaplain learned the Pt. AD choices may change if he had the opportunity to talk to [employer].  The chaplain understands the Pt. cell phone is at [facility]. The chaplain provided education on using the room phone. The chaplain appreciates the Pt. RN assistance with navigating the Pt. long distance phone call to his friend Dickey.  The chaplain will revisit the Pt. on Monday to discuss the completion of the Pt. AD.  Chaplain Leeroy Hummer [phone]

## 2023-10-31 NOTE — TOC Initial Note (Signed)
 Transition of Care [facility]) - Initial/Assessment Note    Patient Details  Name: Peter Yang MRN: [id number] Date of Birth: 1955/11/19  [facility] CM/SW Contact:    Justina Delcia Czar, RN Phone Number: [phone] 10/31/2023, 2:11 PM  Clinical Narrative:                 CM spoke to pt and states he wants to research other facilities. Will send message to CSW. Pt is from [name].   Expected Discharge Plan: Skilled Nursing Facility Barriers to Discharge: Continued Medical Work up   Patient Goals and CMS Choice   CMS Medicare.gov Compare Post Acute Care list provided to:: Patient Choice offered to / list presented to : Patient      Expected Discharge Plan and Services   Discharge Planning Services: CM Consult Post Acute Care Choice: Skilled Nursing Facility                                        Prior Living Arrangements/Services                       Activities of Daily Living   ADL Screening (condition at time of admission) Independently performs ADLs?: No Does the patient have a NEW difficulty with bathing/dressing/toileting/self-feeding that is expected to last >3 days?: No Does the patient have a NEW difficulty with getting in/out of bed, walking, or climbing stairs that is expected to last >3 days?: No Does the patient have a NEW difficulty with communication that is expected to last >3 days?: No Is the patient deaf or have difficulty hearing?: Yes Does the patient have difficulty seeing, even when wearing glasses/contacts?: Yes Does the patient have difficulty concentrating, remembering, or making decisions?: No  Permission Sought/Granted                  Emotional Assessment       Orientation: : Oriented to Self, Oriented to Place   Psych Involvement: No (comment)  Admission diagnosis:  Septic shock (HCC) [A41.9, R65.21] Patient Active Problem List   Diagnosis Date Noted   Gram-negative bacteremia  10/30/2023   Protein-calorie malnutrition, severe 10/30/2023   Shock (HCC) 10/29/2023   History of MDR Pseudomonas aeruginosa infection 06/07/2023   Malnutrition of moderate degree 06/06/2023   Wound dehiscence in puerperium, perineal 06/04/2023   AMS (altered mental status) 05/07/2023   Seizure-like activity (HCC) 05/06/2023   Altered mental status 05/06/2023   Fournier's gangrene in male 04/02/2023   Soft tissue infection 04/02/2023   Necrotizing soft tissue infection 04/02/2023   UTI (urinary tract infection) due to urinary indwelling catheter (HCC) 04/01/2023   S/P BKA (below knee amputation) unilateral, left (HCC) 04/01/2023   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) 04/01/2023   Long term current use of systemic steroids 04/01/2023   Thrombocytopenia (HCC) 10/01/2022   Orthostatic hypotension 09/24/2022   HFrEF (heart failure with reduced ejection fraction) (HCC) 09/23/2022   Counseling and coordination of care 09/15/2022   Pain 09/14/2022   High risk medication use 09/14/2022   Need for emotional support 09/12/2022   Mood altered 09/11/2022   Medication management 09/11/2022   Palliative care by specialist 09/08/2022   Goals of care, counseling/discussion 09/08/2022   Physical deconditioning 09/05/2022   Gross hematuria 08/31/2022   Type 2 diabetes mellitus with  diabetic peripheral angiopathy without gangrene, without long-term current use of insulin  (HCC)    Impaired functional mobility, balance, gait, and endurance 08/03/2022   Obstructive sleep apnea syndrome 08/03/2022   Pleural effusion 08/03/2022   Catheter-associated urinary tract infection (HCC) 08/03/2022   History of urinary retention 08/03/2022   Mitral valve regurgitation 08/03/2022   Thoracic ascending aortic aneurysm (HCC) 07/23/2022   Borderline low blood pressure determined by examination 07/04/2022   Coronary artery disease due to lipid rich plaque 06/25/2022   Hematuria  06/25/2022   History of non-ST elevation myocardial infarction (NSTEMI) 06/15/2022   PAF (paroxysmal atrial fibrillation) (HCC) 06/15/2022   Dyslipidemia 06/15/2022   PVD (peripheral vascular disease) (HCC) 06/15/2022   Pressure ulcer of sacral region, stage 2 (HCC) 06/15/2022   Chronic HFrEF (heart failure with reduced ejection fraction) (HCC) 06/15/2022   DNR (do not resuscitate) 06/15/2022   Ischemic cardiomyopathy 02/12/2022   Need for assistance with personal care 08/03/2021   Diabetic polyneuropathy associated with type 2 diabetes mellitus (HCC) 07/28/2021   Prolonged QT interval 07/28/2021   Moderate protein-calorie malnutrition (HCC) 06/29/2021   Chronic insomnia 03/31/2021   BPH with obstruction/lower urinary tract symptoms 02/02/2018   Hyperlipidemia 04/15/2016   Vitamin D  deficiency 04/09/2016   Essential hypertension 01/25/2016   PCP:  Patient, No Pcp Per Pharmacy:   [facility] - [place], [place] - [address] [place] [address] Phone: [phone] Fax: [phone]     Social Drivers of Health (SDOH) Social History: SDOH Screenings   Food Insecurity: No Food Insecurity (10/30/2023)  Housing: Low Risk  (10/30/2023)  Transportation Needs: No Transportation Needs (10/30/2023)  Utilities: Not At Risk (10/30/2023)  Depression (PHQ2-9): Low Risk  (09/01/2023)  Financial Resource Strain: Low Risk  (08/13/2018)   Received from [facility] visits prior to 12/21/2022., [facility] visits prior to 12/21/2022.  Social Connections: Socially Isolated (10/30/2023)  Tobacco Use: Low Risk  (10/30/2023)   SDOH Interventions:     Readmission Risk Interventions    05/07/2023    4:42 PM 10/24/2022   11:21 AM 09/02/2022   10:36 AM  Readmission Risk Prevention Plan  Transportation Screening Complete Complete Complete  Medication Review Oceanographer)  Complete Complete  PCP or Specialist appointment  within 3-5 days of discharge Complete Complete Complete  HRI or Home Care Consult Complete Complete Complete  SW Recovery Care/Counseling Consult Complete Complete Complete  Palliative Care Screening Not Applicable Not Applicable Not Applicable  Skilled Nursing Facility Complete Complete Complete

## 2023-11-01 DIAGNOSIS — J90 Pleural effusion, not elsewhere classified: Secondary | ICD-10-CM | POA: Diagnosis not present

## 2023-11-01 DIAGNOSIS — R7881 Bacteremia: Secondary | ICD-10-CM

## 2023-11-01 DIAGNOSIS — E43 Unspecified severe protein-calorie malnutrition: Secondary | ICD-10-CM | POA: Diagnosis not present

## 2023-11-01 DIAGNOSIS — B961 Klebsiella pneumoniae [K. pneumoniae] as the cause of diseases classified elsewhere: Secondary | ICD-10-CM

## 2023-11-01 LAB — GLUCOSE, CAPILLARY
Glucose-Capillary: 213 mg/dL — ABNORMAL HIGH (ref 70–99)
Glucose-Capillary: 152 mg/dL — ABNORMAL HIGH (ref 70–99)
Glucose-Capillary: 210 mg/dL — ABNORMAL HIGH (ref 70–99)
Glucose-Capillary: 190 mg/dL — ABNORMAL HIGH (ref 70–99)
Glucose-Capillary: 227 mg/dL — ABNORMAL HIGH (ref 70–99)
Glucose-Capillary: 202 mg/dL — ABNORMAL HIGH (ref 70–99)

## 2023-11-01 LAB — BASIC METABOLIC PANEL
Anion gap: 5 (ref 5–15)
BUN: 46 mg/dL — ABNORMAL HIGH (ref 8–23)
CO2: 25 mmol/L (ref 22–32)
Calcium: 7.7 mg/dL — ABNORMAL LOW (ref 8.9–10.3)
Chloride: 97 mmol/L — ABNORMAL LOW (ref 98–111)
Creatinine, Ser: 1.01 mg/dL (ref 0.61–1.24)
GFR, Estimated: >60 mL/min (ref >60)
Glucose, Bld: 177 mg/dL — ABNORMAL HIGH (ref 70–99)
Potassium: 4.4 mmol/L (ref 3.5–5.1)
Sodium: 127 mmol/L — ABNORMAL LOW (ref 135–145)

## 2023-11-01 LAB — CULTURE, BLOOD (ROUTINE X 2): Special Requests: ADEQUATE

## 2023-11-01 LAB — MAGNESIUM: Magnesium: 2.4 mg/dL (ref 1.7–2.4)

## 2023-11-01 LAB — PHOSPHORUS: Phosphorus: 1.6 mg/dL — ABNORMAL LOW (ref 2.5–4.6)

## 2023-11-01 MED ORDER — OXYCODONE HCL 5 MG PO TABS
5.0000 mg | ORAL_TABLET | Freq: Once | ORAL | Status: AC
  Administered 2023-11-01: 5 mg via ORAL
  Filled 2023-11-01: qty 1

## 2023-11-01 MED ORDER — SODIUM PHOSPHATES 45 MMOLE/15ML IV SOLN
30.0000 mmol | Freq: Once | INTRAVENOUS | Status: AC
  Administered 2023-11-01: 30 mmol via INTRAVENOUS
  Filled 2023-11-01: qty 10

## 2023-11-01 MED ORDER — LIDOCAINE 5 % EX PTCH: 1.0000 | MEDICATED_PATCH | CUTANEOUS | Status: DC

## 2023-11-01 MED ORDER — SODIUM CHLORIDE 0.9% FLUSH
10.0000 mL | Freq: Two times a day (BID) | INTRAVENOUS | Status: DC
  Administered 2023-11-01 – 2023-11-05 (×6): 10 mL

## 2023-11-01 MED ORDER — SODIUM CHLORIDE 0.9% FLUSH: 10.0000 mL | INTRAVENOUS | Status: DC | PRN

## 2023-11-01 MED ORDER — LIDOCAINE 5 % EX PTCH
1.0000 | MEDICATED_PATCH | CUTANEOUS | Status: AC
  Administered 2023-11-01 – 2023-11-02 (×2): 1 via TRANSDERMAL
  Filled 2023-11-01 (×2): qty 1

## 2023-11-01 NOTE — Evaluation (Signed)
 Physical Therapy Evaluation Patient Details Name: Peter Yang MRN: [id number] DOB: [birth year] Today's Date: 11/01/2023  History of Present Illness  Pt is a 68 yo male who presents to [facility] with confusion, blood cultures found bacteremia due to klebsiella. Pt from [facility]. PMHx:  HTN, CHF, L BKA, CAD, HLD, DM, BPM,  NSTEMI, Rt 5th ray amputation, L BKA   Clinical Impression  Pt admitted with above diagnosis. PTA pt lived at [facility], hoyer lift transfers OOB. Pt reports ability to self propel in w/c. Pt currently with functional limitations due to the deficits listed below (see PT Problem List). On eval, pt required +2 max assist bed mobility. Zero sitting balance requiring max assist to sit EOB due to heavy posterior lean. 90 degree knee flexion contracture L residual limb. Pt will benefit from acute skilled PT to increase their independence and safety with mobility to allow discharge. Recommend return to SNF upon d/c.          If plan is discharge home, recommend the following:     Can travel by private vehicle   No    Equipment Recommendations None recommended by PT  Recommendations for Other Services       Functional Status Assessment Patient has had a recent decline in their functional status and/or demonstrates limited ability to make significant improvements in function in a reasonable and predictable amount of time     Precautions / Restrictions Precautions Precautions: Fall Restrictions Weight Bearing Restrictions Per Provider Order: Yes LLE Weight Bearing Per Provider Order: Non weight bearing Other Position/Activity Restrictions: R heel pressure sore, sacral pressure sore, h/o L BKA      Mobility  Bed Mobility Overal bed mobility: Needs Assistance Bed Mobility: Supine to Sit, Sit to Supine     Supine to sit: Max assist, HOB elevated, +2 for safety/equipment, +2 for physical assistance, Used rails Sit to supine: +2 for safety/equipment, HOB  elevated, Used rails, Max assist, +2 for physical assistance   General bed mobility comments: assist for all aspects of mobility, use of bed pad to transition to EOB    Transfers                   General transfer comment: unable    Ambulation/Gait                  Stairs            Wheelchair Mobility     Tilt Bed    Modified Rankin (Stroke Patients Only)       Balance Overall balance assessment: Needs assistance Sitting-balance support: Feet supported, No upper extremity supported Sitting balance-Leahy Scale: Zero Sitting balance - Comments: Max A static sitting EOB Postural control: Posterior lean                                   Pertinent Vitals/Pain Pain Assessment Pain Assessment: Faces Faces Pain Scale: Hurts even more Pain Location: RLE with touch Pain Descriptors / Indicators: Tender, Sore, Discomfort Pain Intervention(s): Repositioned, Monitored during session, Limited activity within patient's tolerance    Home Living Family/patient expects to be discharged to:: Skilled nursing facility                   Additional Comments: [facility]    Prior Function Prior Level of Function : Needs assist;Patient poor historian/Family not available  Mobility Comments: Pt transfers via hoyer at SNF. Per pt, has been working on standing EOB with RW. ADLs Comments: significant ADL assist needed     Extremity/Trunk Assessment   Upper Extremity Assessment Upper Extremity Assessment: Defer to OT evaluation LUE Deficits / Details: very weak and very limited with AROM, can wiggle fingers and squeeze with hand.    Lower Extremity Assessment Lower Extremity Assessment: LLE deficits/detail;Generalized weakness;RLE deficits/detail RLE Deficits / Details: h/o 5th toe amp LLE Deficits / Details: h/o L BKA, 90 degree knee flexion contracture       Communication   Communication Communication: No apparent  difficulties  Cognition Arousal: Alert Behavior During Therapy: Flat affect Overall Cognitive Status: No family/caregiver present to determine baseline cognitive functioning                                 General Comments: pt with poor awareness of time. Otherwise follows commands well with increased time.        General Comments General comments (skin integrity, edema, etc.): BP 110/70 EOB. Pt with c/o dizziness.    Exercises     Assessment/Plan    PT Assessment Patient needs continued PT services  PT Problem List Decreased strength;Decreased balance;Decreased cognition;Pain;Decreased mobility;Decreased activity tolerance;Decreased range of motion       PT Treatment Interventions Functional mobility training;Balance training;Patient/family education;Therapeutic activities;Therapeutic exercise    PT Goals (Current goals can be found in the Care Plan section)  Acute Rehab PT Goals Patient Stated Goal: to wear his prosthesis and walk PT Goal Formulation: With patient Time For Goal Achievement: 11/15/23 Potential to Achieve Goals: Poor    Frequency Min 1X/week     Co-evaluation PT/OT/SLP Co-Evaluation/Treatment: Yes Reason for Co-Treatment: Complexity of the patient's impairments (multi-system involvement);To address functional/ADL transfers PT goals addressed during session: Mobility/safety with mobility;Balance         AM-PAC PT 6 Clicks Mobility  Outcome Measure Help needed turning from your back to your side while in a flat bed without using bedrails?: A Lot Help needed moving from lying on your back to sitting on the side of a flat bed without using bedrails?: Total Help needed moving to and from a bed to a chair (including a wheelchair)?: Total Help needed standing up from a chair using your arms (e.g., wheelchair or bedside chair)?: Total Help needed to walk in hospital room?: Total Help needed climbing 3-5 steps with a railing? : Total 6 Click  Score: 7    End of Session   Activity Tolerance: Patient limited by fatigue;Patient limited by pain Patient left: in bed;with call bell/phone within reach;with bed alarm set Nurse Communication: Mobility status PT Visit Diagnosis: Muscle weakness (generalized) (M62.81);Other abnormalities of gait and mobility (R26.89)    Time: 8842-8777 PT Time Calculation (min) (ACUTE ONLY): 25 min   Charges:   PT Evaluation $PT Eval Moderate Complexity: 1 Mod   PT General Charges $$ ACUTE PT VISIT: 1 Visit         Sari MATSU., PT  Office # [phone]   Erven Sari Shaker 11/01/2023, 2:24 PM

## 2023-11-01 NOTE — Progress Notes (Signed)
 PROGRESS NOTE    Peter Yang  FMW:[id number] DOB: 1956/09/14 DOA: 10/29/2023 PCP: Patient, No Pcp Per    Chief Complaint  Patient presents with   Altered Mental Status    Decreasing LOC since am     Brief Narrative:   [name] is a 68 y/o gentleman with a history of debility requiring SNF/ rehab since BKA for necrotizing fasciitis, Fournier's gangrene, DM, chronic hypotension, ureterocutaneous fistula who presented with confusion, feeling hot, and hypotension from [place]. He has been treated for several days with ciprofloxacin for UTI. He had gross hematuria and had his foley changed in the ED. He was initially confused, but was started on an epi infusion by EMS with improvement in his mentation. In the ED he became more confused. He is currently unarousable and unable to answer questions or provide additional history. Per his emergency contact, he has always been able to make his own decisions. In the ED he had 1/2 blood cultures obtained before antibiotics due to difficult access. He was started on Levophed , he was weaned off pressors transferred [facility] 1/[facility].   Assessment & Plan:   Principal Problem:   Bacteremia due to Klebsiella pneumoniae Active Problems:   Pleural effusion   Shock (HCC)   Protein-calorie malnutrition, severe   ESBL (extended spectrum beta-lactamase) producing bacteria infection   Septic shock due to gram-negative bacteremia -Candidate urinary source, cystitis. -  There was initially some concern pulmonary/parapneumonic effusion but effusion is transudative with negative gram stain so far. Cultures and cyto pending. RVP negative. 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 -DC his left upper chest PICC line by tomorrow if remains stable with no pressors need  Pleural  effusion -Transudative, follow on cytology and cultures  Chronic hypotension  - on Midodrine .  Hx PAF  - on Apixaban .  Gross hematuria  - improving.  Chronic foley  - s/p change in ED, some concern for fistulization  Chronic hydronephrosis  Acute metabolic encephalopathy  due to sepsis - improving. CT head negative.  AKI, pre-renal vs septic ATN.  Hyponatremia. -Continue with IV fluids  Hypophosphatemia -Repleted  Hyperbilirubinemia, -  mild, due to sepsis  Severe protein calorie malnutrition -Will consult nutritionist, high risk for refeeding syndrome.  Hx DM. -Continue with insulin  sliding scale  GOC -Palliative care input greatly appreciated, patient is currently DNR  Pressure ulcers -Continue with wound care  Pressure Injury 10/29/23 Heel Right Deep Tissue Pressure Injury - Purple or maroon localized area of discolored intact skin or blood-filled blister due to damage of underlying soft tissue from pressure and/or shear. 3.5x2.5 purple (Active)  10/29/23 2000  Location: Heel  Location Orientation: Right  Staging: Deep Tissue Pressure Injury - Purple or maroon localized area of discolored intact skin or blood-filled blister due to damage of underlying soft tissue from pressure and/or shear.  Wound Description (Comments): 3.5x2.5 purple  Present on Admission: Yes     Pressure Injury 10/29/23 Sacrum Upper Stage 1 -  Intact skin with non-blanchable redness of a localized area usually over a bony prominence. (Active)  10/29/23 2000  Location: Sacrum  Location Orientation: Upper  Staging: Stage 1 -  Intact skin with non-blanchable redness of a localized area usually over a bony prominence.  Wound Description (Comments):   Present on Admission: Yes     Pressure Injury 10/30/23 Sacrum  Medial Deep Tissue Pressure Injury - Purple or maroon localized area of discolored intact skin or blood-filled blister due to damage of underlying soft tissue from pressure and/or  shear. (Active)  10/30/23 1002  Location: Sacrum  Location Orientation: Medial  Staging: Deep Tissue Pressure Injury - Purple or maroon localized area of discolored intact skin or blood-filled blister due to damage of underlying soft tissue from pressure and/or shear.  Wound Description (Comments):   Present on Admission: Yes      DVT prophylaxis: Lovenox  Code Status: DNR Family Communication: none at bedside Disposition:   Status is: Inpatient    Consultants:  PCCM ID Palliative medicine   Subjective:  No significant events overnight as discussed with staff, patient appears to be confused, denies any complaints  Objective: Vitals:   11/01/23 0400 11/01/23 0430 11/01/23 0800 11/01/23 1200  BP: 99/76  107/70 100/68  Pulse: 78  80 72  Resp:  18 13 18   Temp:      TempSrc:      SpO2: 100%  100% 100%  Weight:      Height:        Intake/Output Summary (Last 24 hours) at 11/01/2023 1500 Last data filed at 11/01/2023 0355 Gross per 24 hour  Intake 317.38 ml  Output 1325 ml  Net -1007.62 ml   Filed Weights   10/29/23 1408 10/30/23 0500 10/31/23 0447  Weight: 79.4 kg 88.5 kg 90.3 kg    Examination:  Awake, extremely frail, chronically ill-appearing, appears much older than stated age, confused Symmetrical Chest wall movement, Good air movement bilaterally, CTAB RRR,No Gallops,Rubs or new Murmurs, No Parasternal Heave +ve B.Sounds, Abd Soft, No tenderness, No rebound - guarding or rigidity. No Cyanosis, Clubbing, anasarca Left BKA Catheter bruising  Left upper chest PICC line   Data Reviewed: I have personally reviewed following labs and imaging studies  CBC: Recent Labs  Lab 10/29/23 1457 10/29/23 1730 10/29/23 2321 10/30/23 0403 10/31/23 0429  WBC 33.6* 29.0* 35.2* 36.6* 18.2*  NEUTROABS 31.9*  --   --   --   --   HGB 10.5* 9.9* 10.2* 10.4* 8.8*  HCT 32.4* 30.2* 31.0* 31.2* 26.8*  MCV 91.0 90.1 90.1 88.9 88.7  PLT 227 220 294 325 190    Basic  Metabolic Panel: Recent Labs  Lab 10/29/23 1457 10/29/23 2321 10/30/23 0403 10/31/23 0429 11/01/23 0507  NA 136 131* 130* 128* 127*  K 3.4* 3.8 3.8 3.9 4.4  CL 100 97* 96* 96* 97*  CO2 24 21* 22 23 25   GLUCOSE 100* 311* 290* 243* 177*  BUN 59* 51* 55* 49* 46*  CREATININE 1.73* 1.46* 1.36* 0.97 1.01  CALCIUM  8.0* 7.8* 7.9* 7.8* 7.7*  MG  --  1.8 1.7 1.9 2.4  PHOS  --   --  3.7 2.4* 1.6*    GFR: Estimated Creatinine Clearance: 79 mL/min (by C-G formula based on SCr of 1.01 mg/dL).  Liver Function Tests: Recent Labs  Lab 10/29/23 1457 10/29/23 1925  AST 18  --   ALT 10  --   ALKPHOS 99  --   BILITOT 1.3*  --   PROT 5.7* 5.4*  ALBUMIN  2.1*  --     CBG: Recent Labs  Lab 10/31/23 2351 11/01/23 0345 11/01/23 0811 11/01/23 0830 11/01/23 1241  GLUCAP 140* 213* 152* 210* 190*     Recent Results (from the past 240 hours)  Resp panel by RT-PCR (RSV, Flu A&B, Covid) Anterior Nasal Swab     Status: None  Collection Time: 10/29/23  2:09 PM   Specimen: Anterior Nasal Swab  Result Value Ref Range Status   SARS Coronavirus 2 by RT PCR NEGATIVE NEGATIVE Final   Influenza A by PCR NEGATIVE NEGATIVE Final   Influenza B by PCR NEGATIVE NEGATIVE Final    Comment: (NOTE) The Xpert Xpress SARS-CoV-2/FLU/RSV plus assay is intended as an aid in the diagnosis of influenza from Nasopharyngeal swab specimens and should not be used as a sole basis for treatment. Nasal washings and aspirates are unacceptable for Xpert Xpress SARS-CoV-2/FLU/RSV testing.  Fact Sheet for Patients: bloggercourse.com  Fact Sheet for Healthcare Providers: seriousbroker.it  This test is not yet approved or cleared by the [place]  FDA and has been authorized for detection and/or diagnosis of SARS-CoV-2 by FDA under an Emergency Use Authorization (EUA). This EUA will remain in effect (meaning this test can be used) for the duration of the COVID-19  declaration under Section 564(b)(1) of the Act, 21 U.S.C. section 360bbb-3(b)(1), unless the authorization is terminated or revoked.     Resp Syncytial Virus by PCR NEGATIVE NEGATIVE Final    Comment: (NOTE) Fact Sheet for Patients: bloggercourse.com  Fact Sheet for Healthcare Providers: seriousbroker.it  This test is not yet approved or cleared by the [place]  FDA and has been authorized for detection and/or diagnosis of SARS-CoV-2 by FDA under an Emergency Use Authorization (EUA). This EUA will remain in effect (meaning this test can be used) for the duration of the COVID-19 declaration under Section 564(b)(1) of the Act, 21 U.S.C. section 360bbb-3(b)(1), unless the authorization is terminated or revoked.  Performed at [facility], [address], [place], [place] [address]   Blood Culture (routine x 2)     Status: Abnormal   Collection Time: 10/29/23  2:14 PM   Specimen: BLOOD LEFT ARM  Result Value Ref Range Status   Specimen Description BLOOD LEFT ARM  Final   Special Requests   Final    BOTTLES DRAWN AEROBIC AND ANAEROBIC Blood Culture adequate volume   Culture  Setup Time   Final    GRAM NEGATIVE RODS IN BOTH AEROBIC AND ANAEROBIC BOTTLES CRITICAL RESULT CALLED TO, READ BACK BY AND VERIFIED WITH: PHARMD ANDREW MEYER ON 10/30/23 @ 1608 BY DRT Performed at [facility], [address] [place], [place] [address]    Culture (A)  Final    KLEBSIELLA PNEUMONIAE Confirmed Extended Spectrum Beta-Lactamase Producer (ESBL).  In bloodstream infections from ESBL organisms, carbapenems are preferred over piperacillin /tazobactam. They are shown to have a lower risk of mortality.    Report Status 11/01/2023 FINAL  Final   Organism ID, Bacteria KLEBSIELLA PNEUMONIAE  Final      Susceptibility   Klebsiella pneumoniae - MIC*    AMPICILLIN >=32 RESISTANT Resistant     CEFEPIME  >=32 RESISTANT Resistant     CEFTAZIDIME   >=64 RESISTANT Resistant     CEFTRIAXONE  >=64 RESISTANT Resistant     CIPROFLOXACIN >=4 RESISTANT Resistant     GENTAMICIN >=16 RESISTANT Resistant     IMIPENEM 1 SENSITIVE Sensitive     TRIMETH /SULFA  >=320 RESISTANT Resistant     AMPICILLIN/SULBACTAM >=32 RESISTANT Resistant     PIP/TAZO 64 INTERMEDIATE Intermediate ug/mL    * KLEBSIELLA PNEUMONIAE  Blood Culture ID Panel (Reflexed)     Status: Abnormal   Collection Time: 10/29/23  2:14 PM  Result Value Ref Range Status   Enterococcus faecalis NOT DETECTED NOT DETECTED Final   Enterococcus Faecium NOT DETECTED NOT  DETECTED Final   Listeria monocytogenes NOT DETECTED NOT DETECTED Final   Staphylococcus species NOT DETECTED NOT DETECTED Final   Staphylococcus aureus (BCID) NOT DETECTED NOT DETECTED Final   Staphylococcus epidermidis NOT DETECTED NOT DETECTED Final   Staphylococcus lugdunensis NOT DETECTED NOT DETECTED Final   Streptococcus species NOT DETECTED NOT DETECTED Final   Streptococcus agalactiae NOT DETECTED NOT DETECTED Final   Streptococcus pneumoniae NOT DETECTED NOT DETECTED Final   Streptococcus pyogenes NOT DETECTED NOT DETECTED Final   A.calcoaceticus-baumannii NOT DETECTED NOT DETECTED Final   Bacteroides fragilis NOT DETECTED NOT DETECTED Final   Enterobacterales DETECTED (A) NOT DETECTED Final    Comment: Enterobacterales represent a large order of gram negative bacteria, not a single organism. CRITICAL RESULT CALLED TO, READ BACK BY AND VERIFIED WITH: PHARMD ANDREW MEYER ON 10/30/23 @ 1608 BY DRT    Enterobacter cloacae complex NOT DETECTED NOT DETECTED Final   Escherichia coli NOT DETECTED NOT DETECTED Final   Klebsiella aerogenes NOT DETECTED NOT DETECTED Final   Klebsiella oxytoca NOT DETECTED NOT DETECTED Final   Klebsiella pneumoniae DETECTED (A) NOT DETECTED Final    Comment: CRITICAL RESULT CALLED TO, READ BACK BY AND VERIFIED WITH: PHARMD ANDREW MEYER ON 10/30/23 @ 1608 BY DRT    Proteus species NOT  DETECTED NOT DETECTED Final   Salmonella species NOT DETECTED NOT DETECTED Final   Serratia marcescens NOT DETECTED NOT DETECTED Final   Haemophilus influenzae NOT DETECTED NOT DETECTED Final   Neisseria meningitidis NOT DETECTED NOT DETECTED Final   Pseudomonas aeruginosa NOT DETECTED NOT DETECTED Final   Stenotrophomonas maltophilia NOT DETECTED NOT DETECTED Final   Candida albicans NOT DETECTED NOT DETECTED Final   Candida auris NOT DETECTED NOT DETECTED Final   Candida glabrata NOT DETECTED NOT DETECTED Final   Candida krusei NOT DETECTED NOT DETECTED Final   Candida parapsilosis NOT DETECTED NOT DETECTED Final   Candida tropicalis NOT DETECTED NOT DETECTED Final   Cryptococcus neoformans/gattii NOT DETECTED NOT DETECTED Final   CTX-M ESBL DETECTED (A) NOT DETECTED Final    Comment: CRITICAL RESULT CALLED TO, READ BACK BY AND VERIFIED WITH: PHARMD ANDREW MEYER ON 10/30/23 @ 1608 BY DRT (NOTE) Extended spectrum beta-lactamase detected. Recommend a carbapenem as initial therapy.      Carbapenem resistance IMP NOT DETECTED NOT DETECTED Final   Carbapenem resistance KPC NOT DETECTED NOT DETECTED Final   Carbapenem resistance NDM NOT DETECTED NOT DETECTED Final   Carbapenem resist OXA 48 LIKE NOT DETECTED NOT DETECTED Final   Carbapenem resistance VIM NOT DETECTED NOT DETECTED Final    Comment: Performed at [facility], [address], [place], [place] [address]  Blood Culture (routine x 2)     Status: None (Preliminary result)   Collection Time: 10/29/23  2:57 PM   Specimen: BLOOD LEFT ARM  Result Value Ref Range Status   Specimen Description BLOOD LEFT ARM  Final   Special Requests   Final    BOTTLES DRAWN AEROBIC AND ANAEROBIC Blood Culture results may not be optimal due to an inadequate volume of blood received in culture bottles   Culture   Final    NO GROWTH 3 DAYS Performed at [facility], [address], [place], [place] [address]    Report Status PENDING   Incomplete  Respiratory (~20 pathogens) panel by PCR     Status: None   Collection Time: 10/29/23  5:31 PM   Specimen: Pleural; Respiratory  Result Value Ref Range  Status   Adenovirus NOT DETECTED NOT DETECTED Final   Coronavirus 229E NOT DETECTED NOT DETECTED Final    Comment: (NOTE) The Coronavirus on the Respiratory Panel, DOES NOT test for the novel  Coronavirus (2019 nCoV)    Coronavirus HKU1 NOT DETECTED NOT DETECTED Final   Coronavirus NL63 NOT DETECTED NOT DETECTED Final   Coronavirus OC43 NOT DETECTED NOT DETECTED Final   Metapneumovirus NOT DETECTED NOT DETECTED Final   Rhinovirus / Enterovirus NOT DETECTED NOT DETECTED Final   Influenza A NOT DETECTED NOT DETECTED Final   Influenza B NOT DETECTED NOT DETECTED Final   Parainfluenza Virus 1 NOT DETECTED NOT DETECTED Final   Parainfluenza Virus 2 NOT DETECTED NOT DETECTED Final   Parainfluenza Virus 3 NOT DETECTED NOT DETECTED Final   Parainfluenza Virus 4 NOT DETECTED NOT DETECTED Final   Respiratory Syncytial Virus NOT DETECTED NOT DETECTED Final   Bordetella pertussis NOT DETECTED NOT DETECTED Final   Bordetella Parapertussis NOT DETECTED NOT DETECTED Final   Chlamydophila pneumoniae NOT DETECTED NOT DETECTED Final   Mycoplasma pneumoniae NOT DETECTED NOT DETECTED Final    Comment: Performed at [facility], [address], [place], [place] [address]  Body fluid culture w Gram Stain     Status: None (Preliminary result)   Collection Time: 10/29/23  7:10 PM   Specimen: Pleural Fluid  Result Value Ref Range Status   Specimen Description PLEURAL  Final   Special Requests NONE  Final   Gram Stain   Final    WBC PRESENT,BOTH PMN AND MONONUCLEAR NO ORGANISMS SEEN CYTOSPIN SMEAR    Culture   Final    NO GROWTH 3 DAYS Performed at [facility], [address] [place], [place] [address]    Report Status PENDING  Incomplete  MRSA Next Gen by PCR, Nasal     Status: Abnormal   Collection Time: 10/29/23  7:30  PM  Result Value Ref Range Status   MRSA by PCR Next Gen DETECTED (A) NOT DETECTED Final    Comment: RESULT CALLED TO, READ BACK BY AND VERIFIED WITH:  W. HUTCHES 10/29/2023 @ 2217 BY AB (NOTE) The GeneXpert MRSA Assay (FDA approved for NASAL specimens only), is one component of a comprehensive MRSA colonization surveillance program. It is not intended to diagnose MRSA infection nor to guide or monitor treatment for MRSA infections. Test performance is not FDA approved in patients less than 14 years old. Performed at [facility], [address] [place], [place] [address]          Radiology Studies: US  EKG SITE RITE Result Date: 10/31/2023 If Site Rite image not attached, placement could not be confirmed due to current cardiac rhythm.       Scheduled Meds:  ascorbic acid   500 mg Oral Daily   atorvastatin   40 mg Oral QHS   Chlorhexidine  Gluconate Cloth  6 each Topical Daily   enoxaparin  (LOVENOX ) injection  1 mg/kg Subcutaneous Q12H   feeding supplement  237 mL Oral TID BM   folic acid   1 mg Oral Daily   hydrocortisone   5 mg Oral BID   insulin  aspart  0-20 Units Subcutaneous Q4H   insulin  glargine-yfgn  12 Units Subcutaneous BID   midodrine   10 mg Oral TID with meals   multivitamin with minerals  1 tablet Oral Daily   mupirocin  ointment  1 Application Nasal BID   sodium chloride  flush  10-40 mL Intracatheter Q12H   thiamine   100 mg Oral Daily   zinc  sulfate (50mg  elemental zinc )  220 mg Oral Daily   Continuous Infusions:  meropenem  (MERREM ) IV 1 g (11/01/23 1322)   sodium PHOSPHATE  IVPB (in mmol) 30 mmol (11/01/23 0927)     LOS: 3 days      Brayton Lye, MD [facility]   To contact the attending provider between 7A-7P or the covering provider during after hours 7P-7A, please log into the web site [email or web address] and access using universal [place] password for that web site. If you do not have the password, please call the hospital  operator.  11/01/2023, 3:00 PM

## 2023-11-01 NOTE — Evaluation (Addendum)
 Occupational Therapy Evaluation Patient Details Name: Peter Yang MRN: [id number] DOB: Oct 21, 1956 Today's Date: 11/01/2023   History of Present Illness Pt is a 68 yo male who presents to [facility] with confusion, blood cultures found bacteremia due to klebsiella. Pt from [facility]. 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 Pt also c/o soreness with any touch to his RLE making it impossible to try any WB activities today. Pt generally weak in BUEs with prior L sided deficits and needs significant +2 assist for bed mobility and sitting balance. At this time he needs Total to Mod A for ADLs. OT to continue following pt acutely to address deficits and help transition to next level of care. Patient would benefit from post acute skilled rehab facility with <3 hours of therapy and 24/7 support       If plan is discharge home, recommend the following: Two people to help with walking and/or transfers;Two people to help with bathing/dressing/bathroom;Assistance with cooking/housework;Direct supervision/assist for medications management;Supervision due to cognitive status    Functional Status Assessment  Patient has had a recent decline in their functional status and demonstrates the ability to make significant improvements in function in a reasonable and predictable amount of time.  Equipment Recommendations  None recommended by OT (TBD at next level of care)    Recommendations for Other Services       Precautions / Restrictions Precautions Precautions: Fall Restrictions Weight Bearing Restrictions Per Provider Order: Yes LLE Weight Bearing Per Provider Order: Non weight bearing Other Position/Activity Restrictions: R heel pressure sore, sacral pressure sore, h/o L BKA      Mobility Bed Mobility Overal bed  mobility: Needs Assistance Bed Mobility: Supine to Sit, Sit to Supine     Supine to sit: Max assist, HOB elevated, +2 for safety/equipment, +2 for physical assistance, Used rails Sit to supine: +2 for safety/equipment, HOB elevated, Used rails, Max assist, +2 for physical assistance   General bed mobility comments: cues for pt to assist by pulling on bed rails    Transfers                   General transfer comment: unable to      Balance Overall balance assessment: Needs assistance Sitting-balance support: Feet supported, No upper extremity supported Sitting balance-Leahy Scale: Zero Sitting balance - Comments: Max A static sitting EOB Postural control: Posterior lean                                 ADL either performed or assessed with clinical judgement   ADL Overall ADL's : Needs assistance/impaired Eating/Feeding: Bed level;Set up Eating/Feeding Details (indicate cue type and reason): with RUE Grooming: Brushing hair;Sitting;Moderate assistance Grooming Details (indicate cue type and reason): Max A to maintain sitting balance but mod A for combing hair, pt needing cue to get L side of hair as well. OT assisting with back of hair. Pt using RUE Upper Body Bathing: Moderate assistance   Lower Body Bathing: Total assistance;Bed level   Upper Body Dressing : Maximal assistance;Bed level   Lower Body Dressing: Total assistance;Bed level     Toilet Transfer Details (indicate cue type and reason): unable to transfer without lift at this time  Vision   Additional Comments: Some L inattention?     Perception         Praxis         Pertinent Vitals/Pain Pain Assessment Pain Assessment: Faces Faces Pain Scale: Hurts even more Pain Location: RLE with touch Pain Descriptors / Indicators: Tender, Sore, Discomfort Pain Intervention(s): Repositioned, Monitored during session, Limited activity within patient's tolerance      Extremity/Trunk Assessment Upper Extremity Assessment Upper Extremity Assessment: Defer to OT evaluation LUE Deficits / Details: very weak and very limited with AROM, can wiggle fingers and squeeze with hand.   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 Otherwise follows commands well with increased time.     General Comments  BP 110/70 EOB. Pt with c/o dizziness.    Exercises     Shoulder Instructions      Home Living Family/patient expects to be discharged to:: Skilled nursing facility                                 Additional Comments: [facility]      Prior Functioning/Environment Prior Level of Function : Needs assist;Patient poor historian/Family not available             Mobility Comments: Pt transfers via hoyer at [facility]. Per pt, has been working on standing EOB with RW. ADLs Comments: significant ADL assist needed        OT Problem List: Decreased strength;Decreased range of motion;Decreased cognition;Pain;Impaired UE functional use;Impaired balance (sitting and/or standing)      OT Treatment/Interventions: Self-care/ADL training;Therapeutic activities;Balance training;DME and/or AE instruction;Therapeutic exercise;Patient/family education    OT Goals(Current goals can be found in the care plan section) Acute Rehab OT Goals Patient Stated Goal: Go back to rehab OT Goal Formulation: With patient Time For Goal Achievement:  11/15/23 Potential to Achieve Goals: Good ADL Goals Pt/caregiver will Perform Home Exercise Program: Increased strength;Both right and left upper extremity;With Supervision;With written HEP provided Additional ADL Goal #2: Pt will complete bed mobility with Min A+2 to assist with bed pan positioning for toileting Additional ADL Goal #3: Pt will sit EOB with Min A to prepare for seated ADLs Additional ADL Goal #4: Pt will locate and retrieve 3/3 items on L side to increase awareness to said side.  OT Frequency: Min 1X/week    Co-evaluation PT/OT/SLP Co-Evaluation/Treatment: Yes Reason for Co-Treatment: Complexity of the patient's impairments (multi-system involvement);To address functional/ADL transfers PT goals addressed during session: Mobility/safety with mobility;Balance OT goals addressed during session: ADL's and self-care      AM-PAC OT 6 Clicks Daily Activity     Outcome Measure Help from another person eating meals?: A Little Help from another person taking care of personal grooming?: A Lot Help from another person toileting, which includes using toliet, bedpan, or urinal?: Total Help from another person bathing (including washing, rinsing, drying)?: A Lot Help  from another person to put on and taking off regular upper body clothing?: A Lot Help from another person to put on and taking off regular lower body clothing?: Total 6 Click Score: 11   End of Session Nurse Communication: Need for lift equipment;Mobility status (hoyer lift for transfers)  Activity Tolerance: Other (comment) (pt c/o feeling unwell sitting EOB, requesting to sit back down with post lean getting stronger) Patient left: in bed;with bed alarm set;with call bell/phone within reach  OT Visit Diagnosis: Other symptoms and signs involving cognitive function;Other abnormalities of gait and mobility (R26.89)                Time: 1202-1220 OT Time Calculation (min): 18 min Charges:  OT General Charges $OT  Visit: 1 Visit  11/01/2023  AB, OTR/L  Acute Rehabilitation Services  Office: [phone]   Curtistine JONETTA Das 11/01/2023, 2:26 PM

## 2023-11-01 NOTE — Plan of Care (Signed)

## 2023-11-02 DIAGNOSIS — R7881 Bacteremia: Secondary | ICD-10-CM | POA: Diagnosis not present

## 2023-11-02 DIAGNOSIS — B961 Klebsiella pneumoniae [K. pneumoniae] as the cause of diseases classified elsewhere: Secondary | ICD-10-CM | POA: Diagnosis not present

## 2023-11-02 LAB — BPAM RBC
Blood Product Expiration Date: 202501152359
Blood Product Expiration Date: 202501152359
Unit Type and Rh: 5100
Unit Type and Rh: 5100

## 2023-11-02 LAB — CBC
HCT: 28.5 % — ABNORMAL LOW (ref 39.0–52.0)
Hemoglobin: 9.5 g/dL — ABNORMAL LOW (ref 13.0–17.0)
MCH: 29 pg (ref 26.0–34.0)
MCHC: 33.3 g/dL (ref 30.0–36.0)
MCV: 86.9 fL (ref 80.0–100.0)
Platelets: 300 10*3/uL (ref 150–400)
RBC: 3.28 MIL/uL — ABNORMAL LOW (ref 4.22–5.81)
RDW: 17.1 % — ABNORMAL HIGH (ref 11.5–15.5)
WBC: 23.7 10*3/uL — ABNORMAL HIGH (ref 4.0–10.5)
nRBC: 0 % (ref 0.0–0.2)

## 2023-11-02 LAB — TYPE AND SCREEN
ABO/RH(D): A POS
Antibody Screen: POSITIVE
Unit division: 0
Unit division: 0

## 2023-11-02 LAB — GLUCOSE, CAPILLARY
Glucose-Capillary: 111 mg/dL — ABNORMAL HIGH (ref 70–99)
Glucose-Capillary: 117 mg/dL — ABNORMAL HIGH (ref 70–99)
Glucose-Capillary: 142 mg/dL — ABNORMAL HIGH (ref 70–99)
Glucose-Capillary: 143 mg/dL — ABNORMAL HIGH (ref 70–99)
Glucose-Capillary: 64 mg/dL — ABNORMAL LOW (ref 70–99)
Glucose-Capillary: 77 mg/dL (ref 70–99)
Glucose-Capillary: 77 mg/dL (ref 70–99)

## 2023-11-02 LAB — BASIC METABOLIC PANEL
Anion gap: 10 (ref 5–15)
BUN: 42 mg/dL — ABNORMAL HIGH (ref 8–23)
CO2: 24 mmol/L (ref 22–32)
Calcium: 8.1 mg/dL — ABNORMAL LOW (ref 8.9–10.3)
Chloride: 96 mmol/L — ABNORMAL LOW (ref 98–111)
Creatinine, Ser: 0.76 mg/dL (ref 0.61–1.24)
GFR, Estimated: >60 mL/min (ref >60)
Glucose, Bld: 95 mg/dL (ref 70–99)
Potassium: 3.8 mmol/L (ref 3.5–5.1)
Sodium: 130 mmol/L — ABNORMAL LOW (ref 135–145)

## 2023-11-02 LAB — PHOSPHORUS: Phosphorus: 2.4 mg/dL — ABNORMAL LOW (ref 2.5–4.6)

## 2023-11-02 LAB — BODY FLUID CULTURE W GRAM STAIN: Culture: NO GROWTH

## 2023-11-02 LAB — MAGNESIUM: Magnesium: 2.5 mg/dL — ABNORMAL HIGH (ref 1.7–2.4)

## 2023-11-02 LAB — PROCALCITONIN: Procalcitonin: 4.72 ng/mL

## 2023-11-02 MED ORDER — OXYCODONE HCL 5 MG PO TABS
5.0000 mg | ORAL_TABLET | Freq: Four times a day (QID) | ORAL | Status: DC | PRN
  Administered 2023-11-02 – 2023-11-08 (×3): 5 mg via ORAL
  Filled 2023-11-02 (×3): qty 1

## 2023-11-02 MED ORDER — OXYCODONE HCL ER 15 MG PO T12A
15.0000 mg | EXTENDED_RELEASE_TABLET | Freq: Two times a day (BID) | ORAL | Status: DC
  Administered 2023-11-02 – 2023-11-03 (×3): 15 mg via ORAL
  Filled 2023-11-02 (×3): qty 1

## 2023-11-02 MED ORDER — INSULIN GLARGINE-YFGN 100 UNIT/ML ~~LOC~~ SOLN
5.0000 [IU] | Freq: Two times a day (BID) | SUBCUTANEOUS | Status: DC
  Administered 2023-11-03 – 2023-11-05 (×4): 5 [IU] via SUBCUTANEOUS
  Filled 2023-11-02 (×6): qty 0.05

## 2023-11-02 MED ORDER — HYDROMORPHONE HCL 1 MG/ML IJ SOLN
0.5000 mg | INTRAMUSCULAR | Status: AC
Start: 2023-11-02 — End: 2023-11-02
  Administered 2023-11-02: 0.5 mg via INTRAVENOUS
  Filled 2023-11-02: qty 0.5

## 2023-11-02 MED ORDER — GABAPENTIN 300 MG PO CAPS
300.0000 mg | ORAL_CAPSULE | Freq: Every day | ORAL | Status: DC
  Administered 2023-11-02 – 2023-11-03 (×2): 300 mg via ORAL
  Filled 2023-11-02 (×2): qty 1

## 2023-11-02 MED ORDER — METHOCARBAMOL 500 MG PO TABS
500.0000 mg | ORAL_TABLET | Freq: Four times a day (QID) | ORAL | Status: DC | PRN
  Administered 2023-11-02: 500 mg via ORAL
  Filled 2023-11-02: qty 1

## 2023-11-02 MED ORDER — HYDROXYZINE HCL 25 MG PO TABS
25.0000 mg | ORAL_TABLET | Freq: Once | ORAL | Status: AC
  Administered 2023-11-02: 25 mg via ORAL
  Filled 2023-11-02: qty 1

## 2023-11-02 MED ORDER — DEXTROSE 50 % IV SOLN: 50.0000 mL | INTRAVENOUS | Status: DC | PRN

## 2023-11-02 NOTE — Progress Notes (Signed)
 Pharmacy Antibiotic Note  Peter Yang is a 68 y.o. male with ESBL producing Klebsiella pneumoniae bacteremia.  Pharmacy has been consulted for Meropenem  dosing.  ID has signed off. Plan per ID is 7 day course of carbapenem therapy.  Plan: Continue meropenem  1 g IV q8h F/u adding stop date of 1/16 Continue to monitor clinical progression  Height: 6' (182.9 cm) Weight: 90.3 kg (199 lb 1.2 oz) IBW/kg (Calculated) : 77.6  Temp (24hrs), Avg:97.8 F (36.6 C), Min:97.4 F (36.3 C), Max:98.2 F (36.8 C)  Recent Labs  Lab 10/29/23 1504 10/29/23 1730 10/29/23 1740 10/29/23 2321 10/30/23 0403 10/31/23 0429 11/01/23 0507 11/02/23 0241  WBC  --  29.0*  --  35.2* 36.6* 18.2*  --  23.7*  CREATININE  --   --   --  1.46* 1.36* 0.97 1.01 0.76  LATICACIDVEN 1.9  --  1.5 1.2  --   --   --   --     Estimated Creatinine Clearance: 99.7 mL/min (by C-G formula based on SCr of 0.76 mg/dL).    Allergies  Allergen Reactions   Sglt2 Inhibitors Other (See Comments)    Fournier's gangrene    Harlene VEAR Mandril 11/02/2023 10:15 AM

## 2023-11-02 NOTE — Progress Notes (Signed)
 Nutrition Follow-up  DOCUMENTATION CODES:   Severe malnutrition in context of chronic illness  INTERVENTION:  Liberalize diet to Regular Ensure Enlive po BID, each supplement provides 350 kcal and 20 grams of protein Magic cup TID with meals, each supplement provides 290 kcal and 9 grams of protein  Continue MVI, thiamine , and folic acid  supplementation  Vitamin C  supplementation (250mg  BID) x 30 days Zinc  supplementation (220mg  QD x14 days) Healthy eating video   NUTRITION DIAGNOSIS:   Severe Malnutrition related to chronic illness as evidenced by severe fat depletion, severe muscle depletion, energy intake < or equal to 75% for > or equal to 1 month.    GOAL:   Patient will meet greater than or equal to 90% of their needs    MONITOR:   Supplement acceptance, PO intake, Labs, Skin  REASON FOR ASSESSMENT:   Consult Assessment of nutrition requirement/status, Diet education  ASSESSMENT:   Pt arrived from [facility] d/t AMS and hypotension and hematuria suggestive of UTI. PMH: CHF T2DM, A-fib, HTN, HLD, LBKA, prior necrotizing fasciitis. Patient unavailable to speak with on this day.  All information obtained through EMR and team. Review of EMR revealed good appetite with no current changes noted, Weight increase noted. Has been residing in SNF since BKA.  Will be returning to SNF.  Will provide nutrition for wound care handouts at discharge. There is no benefit to excessive dietary restrictions related advanced age, increased nutrient needs . Patient would benefit better from liberalized diet to help meet increased needs and promote better oral intake.   Weight history: 10/31/23 90.3 kg  07/28/23 108.1 kg  07/09/23 108.6 kg  06/29/23 108.7 kg  06/19/23 108.7 kg  05/12/23 115 kg  04/19/23 109.3 kg  04/16/23 110.7 kg   Hospital weight history: 10/31/23 0447 90.3 kg 199.08 lbs  10/30/23 0500 88.5 kg 195.11 lbs  10/29/23 1408 79.4 kg 175 lbs    Average Meal  Intake: 50-100: 67% intake x 6 recorded meals ( unable to determine accuracy due to gaps in documentation)   Nutritionally Relevant Medications: Scheduled Meds:  ascorbic acid   500 mg Oral Daily   atorvastatin   40 mg Oral QHS   feeding supplement  237 mL Oral TID BM   folic acid   1 mg Oral Daily   gabapentin   300 mg Oral QHS   multivitamin with minerals  1 tablet Oral Daily   thiamine   100 mg Oral Daily   zinc  sulfate (50mg  elemental zinc )  220 mg Oral Daily      Labs Reviewed: Na; 130, Mg; 2.5, Phos; 2.4     NUTRITION - FOCUSED PHYSICAL EXAM:  Flowsheet Row Most Recent Value  Orbital Region Mild depletion  Upper Arm Region Severe depletion  Thoracic and Lumbar Region Moderate depletion  Buccal Region Severe depletion  Temple Region Severe depletion  Clavicle and Acromion Bone Region Moderate depletion  Scapular Bone Region Moderate depletion  Patellar Region Moderate depletion  Anterior Thigh Region Severe depletion  Posterior Calf Region Severe depletion  [LBKA]  Edema (RD Assessment) Mild  Hair Reviewed  Eyes Reviewed  Mouth Reviewed  Skin Reviewed  Nails Reviewed       Diet Order:   Diet Order             Diet Carb Modified Fluid consistency: Thin  Diet effective now                   EDUCATION NEEDS:   Not appropriate for education  at this time  Skin:  Skin Assessment: Skin Integrity Issues: Skin Integrity Issues:: DTI, Stage I (Non-pressure wounds to: R anterior knee, L hand, R foot) DTI: Sacrum Medial Deep Tissue Pressure Injury - Purple or maroon localized area of discolored intact skin or blood-filled blister due to damage of underlying soft tissue from pressure and/or shear Stage I: Upper Sacrum, R Heel  Last BM:  01/09  Height:   Ht Readings from Last 1 Encounters:  10/29/23 6' (1.829 m)    Weight:   Wt Readings from Last 1 Encounters:  10/31/23 90.3 kg    Ideal Body Weight:  80.9 kg  BMI:  Body mass index is 27  kg/m.  Estimated Nutritional Needs:   Kcal:  2200-2400kcal  Protein:  115-125g  Fluid:  >2L/day    Jenna Pew RDN, LDN Clinical Dietitian   If unable to reach, please contact RD Inpatient secure chat group between 8 am-4 pm daily

## 2023-11-02 NOTE — Plan of Care (Signed)

## 2023-11-02 NOTE — Plan of Care (Signed)
  Problem: Fluid Volume: Goal: Ability to maintain a balanced intake and output will improve Outcome: Progressing   Problem: Metabolic: Goal: Ability to maintain appropriate glucose levels will improve Outcome: Progressing   Problem: Clinical Measurements: Goal: Ability to maintain clinical measurements within normal limits will improve Outcome: Progressing Goal: Will remain free from infection Outcome: Progressing Goal: Diagnostic test results will improve Outcome: Progressing Goal: Respiratory complications will improve Outcome: Progressing Goal: Cardiovascular complication will be avoided Outcome: Progressing   Problem: Education: Goal: Ability to describe self-care measures that may prevent or decrease complications (Diabetes Survival Skills Education) will improve Outcome: Not Progressing Goal: Individualized Educational Video(s) Outcome: Not Progressing   Problem: Coping: Goal: Ability to adjust to condition or change in health will improve Outcome: Not Progressing   Problem: Health Behavior/Discharge Planning: Goal: Ability to identify and utilize available resources and services will improve Outcome: Not Progressing Goal: Ability to manage health-related needs will improve Outcome: Not Progressing   Problem: Nutritional: Goal: Maintenance of adequate nutrition will improve Outcome: Not Progressing Goal: Progress toward achieving an optimal weight will improve Outcome: Not Progressing   Problem: Skin Integrity: Goal: Risk for impaired skin integrity will decrease Outcome: Not Progressing   Problem: Tissue Perfusion: Goal: Adequacy of tissue perfusion will improve Outcome: Not Progressing   Problem: Education: Goal: Knowledge of General Education information will improve Description: Including pain rating scale, medication(s)/side effects and non-pharmacologic comfort measures Outcome: Not Progressing   Problem: Health Behavior/Discharge Planning: Goal:  Ability to manage health-related needs will improve Outcome: Not Progressing   Problem: Activity: Goal: Risk for activity intolerance will decrease Outcome: Not Progressing   Problem: Nutrition: Goal: Adequate nutrition will be maintained Outcome: Not Progressing   Problem: Coping: Goal: Level of anxiety will decrease Outcome: Not Progressing   Problem: Elimination: Goal: Will not experience complications related to bowel motility Outcome: Not Progressing Goal: Will not experience complications related to urinary retention Outcome: Not Progressing   Problem: Pain Management: Goal: General experience of comfort will improve Outcome: Not Progressing   Problem: Safety: Goal: Ability to remain free from injury will improve Outcome: Not Progressing   Problem: Skin Integrity: Goal: Risk for impaired skin integrity will decrease Outcome: Not Progressing

## 2023-11-02 NOTE — Progress Notes (Signed)
 Overnight cross coverage.  Received multiple calls overnight regarding the patient having pain in his right hip, left flank, with underlying chronic pain, not relieved with oxycodone  and lidocaine  patch.  Presented at bedside.  The patient appears calm, vital signs are stable without tachycardia, tachypnea, or hypertension, and stating that his pain is over 10 out of 10.  Received another call from nursing staff regarding the patient calling out for pain medications and for repositioning every 5 to 10 minutes.  Added IV Dilaudid  0.5 mg x 1 dose.  Overlay mattress also ordered.  We will continue to closely monitor and treat as indicated.  Time: 15 minutes.

## 2023-11-02 NOTE — Progress Notes (Addendum)
 PROGRESS NOTE    Peter Yang  FMW:[id number] DOB: 1956/08/09 DOA: 10/29/2023 PCP: Patient, No Pcp Per    Chief Complaint  Patient presents with   Altered Mental Status    Decreasing LOC since am     Brief Narrative:   [name] is a 68 y/o gentleman with a history of debility requiring SNF/ rehab since BKA for necrotizing fasciitis, Fournier's gangrene, DM, chronic hypotension, ureterocutaneous fistula who presented with confusion, feeling hot, and hypotension from [place]. He has been treated for several days with ciprofloxacin for UTI. He had gross hematuria and had his foley changed in the ED. He was initially confused, but was started on an epi infusion by EMS with improvement in his mentation. In the ED he became more confused. He is currently unarousable and unable to answer questions or provide additional history. Per his emergency contact, he has always been able to make his own decisions. In the ED he had 1/2 blood cultures obtained before antibiotics due to difficult access. He was started on Levophed , he was weaned off pressors transferred to [facility] 1/11.   Assessment & Plan:   Principal Problem:   Bacteremia due to Klebsiella pneumoniae Active Problems:   Pleural effusion   Shock (HCC)   Protein-calorie malnutrition, severe   ESBL (extended spectrum beta-lactamase) producing bacteria infection   Septic shock due to ESBL bacteremia -Due to urinary source-cystitis - There was initially some concern pulmonary/parapneumonic effusion but effusion is transudative with negative gram stain so far. Cultures and cyto pending. RVP negative. -Weaned off pressors -Weaned off high-dose steroids, continue to taper gradually currently back at home dose of hydrocortisone  5 mg p.o. 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 -Leukocytosis trending up today, no further requirement of  pressors, so we will DC his PICC line as it was inserted when she had bacteremia, it was done through exchanged via guidewire from central line generally   Pleural effusion -Transudative, follow on cytology and cultures  Chronic hypotension  - on Midodrine .  Hx PAF  - on Apixaban .  Gross hematuria  - improving.  Chronic foley  - s/p change in ED, some concern for fistulization  Chronic hydronephrosis  Acute metabolic encephalopathy  due to sepsis - improving. CT head negative.  AKI, pre-renal vs septic ATN.  Hyponatremia. -Continue with IV fluids  Hypophosphatemia -Repleted  Hyperbilirubinemia, -  mild, due to sepsis  Severe protein calorie malnutrition -Will consult nutritionist, high risk for refeeding syndrome.  Hx DM. -Continue with insulin  sliding scale  Chronic pain syndrome -Altered mentation has resolved, resume home regimen  GOC -Palliative care input greatly appreciated, patient is currently DNR  Pressure ulcers -Continue with wound care  Pressure Injury 10/29/23 Heel Right Deep Tissue Pressure Injury - Purple or maroon localized area of discolored intact skin or blood-filled blister due to damage of underlying soft tissue from pressure and/or shear. 3.5x2.5 purple (Active)  10/29/23 2000  Location: Heel  Location Orientation: Right  Staging: Deep Tissue Pressure Injury - Purple or maroon localized area of discolored intact skin or blood-filled blister due to damage of underlying soft tissue from pressure and/or shear.  Wound Description (Comments): 3.5x2.5 purple  Present on Admission: Yes     Pressure Injury 10/29/23 Sacrum Upper Stage 1 -  Intact skin with non-blanchable redness of a localized area usually over a bony prominence. (Active)  10/29/23 2000  Location: Sacrum  Location Orientation: Upper  Staging: Stage 1 -  Intact skin with non-blanchable redness of a localized area usually over a bony prominence.  Wound Description (Comments):    Present on Admission: Yes     Pressure Injury 10/30/23 Sacrum Medial Deep Tissue Pressure Injury - Purple or maroon localized area of discolored intact skin or blood-filled blister due to damage of underlying soft tissue from pressure and/or shear. (Active)  10/30/23 1002  Location: Sacrum  Location Orientation: Medial  Staging: Deep Tissue Pressure Injury - Purple or maroon localized area of discolored intact skin or blood-filled blister due to damage of underlying soft tissue from pressure and/or shear.  Wound Description (Comments):   Present on Admission: Yes      DVT prophylaxis: Lovenox  Code Status: DNR Family Communication: none at bedside Disposition:   Status is: Inpatient    Consultants:  PCCM ID Palliative medicine   Subjective:  Complaining of pain in left hip area, reports this is a chronic pain, now mentation back to baseline we will resume his home narcotics  Objective: Vitals:   11/02/23 0400 11/02/23 0757 11/02/23 1200 11/02/23 1344  BP: 130/73 106/81 92/83 106/77  Pulse: 86 92 96 (!) 105  Resp:   20 18  Temp:  (!) 97.4 F (36.3 C)    TempSrc:  Oral    SpO2: 100% 100% 97% 97%  Weight:      Height:        Intake/Output Summary (Last 24 hours) at 11/02/2023 1426 Last data filed at 11/02/2023 9061 Gross per 24 hour  Intake 730 ml  Output 1400 ml  Net -670 ml   Filed Weights   10/29/23 1408 10/30/23 0500 10/31/23 0447  Weight: 79.4 kg 88.5 kg 90.3 kg    Examination:   Left upper chest PICC line will be discontinued today  Awake Alert, Oriented X 3, limited frail, deconditioned, appears older than stated age Symmetrical Chest wall movement, Good air movement bilaterally, CTAB RRR,No Gallops,Rubs or new Murmurs, No Parasternal Heave +ve B.Sounds, Abd Soft, No tenderness, No rebound - guarding or rigidity. No Cyanosis, Clubbing or edema, left BKA, Foley catheter present, has skin bruising   Data Reviewed: I have personally reviewed  following labs and imaging studies  CBC: Recent Labs  Lab 10/29/23 1457 10/29/23 1730 10/29/23 2321 10/30/23 0403 10/31/23 0429 11/02/23 0241  WBC 33.6* 29.0* 35.2* 36.6* 18.2* 23.7*  NEUTROABS 31.9*  --   --   --   --   --   HGB 10.5* 9.9* 10.2* 10.4* 8.8* 9.5*  HCT 32.4* 30.2* 31.0* 31.2* 26.8* 28.5*  MCV 91.0 90.1 90.1 88.9 88.7 86.9  PLT 227 220 294 325 190 300    Basic Metabolic Panel: Recent Labs  Lab 10/29/23 2321 10/30/23 0403 10/31/23 0429 11/01/23 0507 11/02/23 0241  NA 131* 130* 128* 127* 130*  K 3.8 3.8 3.9 4.4 3.8  CL 97* 96* 96* 97* 96*  CO2 21* 22 23 25 24   GLUCOSE 311* 290* 243* 177* 95  BUN 51* 55* 49* 46* 42*  CREATININE 1.46* 1.36* 0.97 1.01 0.76  CALCIUM  7.8* 7.9* 7.8* 7.7* 8.1*  MG 1.8 1.7 1.9 2.4 2.5*  PHOS  --  3.7 2.4* 1.6* 2.4*    GFR: Estimated Creatinine Clearance: 99.7 mL/min (by C-G formula based on SCr of 0.76 mg/dL).  Liver Function Tests: Recent Labs  Lab 10/29/23 1457 10/29/23 1925  AST 18  --   ALT 10  --   ALKPHOS 99  --  BILITOT 1.3*  --   PROT 5.7* 5.4*  ALBUMIN  2.1*  --     CBG: Recent Labs  Lab 11/01/23 1940 11/02/23 0005 11/02/23 0357 11/02/23 0756 11/02/23 1224  GLUCAP 202* 77 111* 117* 143*     Recent Results (from the past 240 hours)  Resp panel by RT-PCR (RSV, Flu A&B, Covid) Anterior Nasal Swab     Status: None   Collection Time: 10/29/23  2:09 PM   Specimen: Anterior Nasal Swab  Result Value Ref Range Status   SARS Coronavirus 2 by RT PCR NEGATIVE NEGATIVE Final   Influenza A by PCR NEGATIVE NEGATIVE Final   Influenza B by PCR NEGATIVE NEGATIVE Final    Comment: (NOTE) The Xpert Xpress SARS-CoV-2/FLU/RSV plus assay is intended as an aid in the diagnosis of influenza from Nasopharyngeal swab specimens and should not be used as a sole basis for treatment. Nasal washings and aspirates are unacceptable for Xpert Xpress SARS-CoV-2/FLU/RSV testing.  Fact Sheet for  Patients: bloggercourse.com  Fact Sheet for Healthcare Providers: seriousbroker.it  This test is not yet approved or cleared by the [place]  FDA and has been authorized for detection and/or diagnosis of SARS-CoV-2 by FDA under an Emergency Use Authorization (EUA). This EUA will remain in effect (meaning this test can be used) for the duration of the COVID-19 declaration under Section 564(b)(1) of the Act, 21 U.S.C. section 360bbb-3(b)(1), unless the authorization is terminated or revoked.     Resp Syncytial Virus by PCR NEGATIVE NEGATIVE Final    Comment: (NOTE) Fact Sheet for Patients: bloggercourse.com  Fact Sheet for Healthcare Providers: seriousbroker.it  This test is not yet approved or cleared by the [place]  FDA and has been authorized for detection and/or diagnosis of SARS-CoV-2 by FDA under an Emergency Use Authorization (EUA). This EUA will remain in effect (meaning this test can be used) for the duration of the COVID-19 declaration under Section 564(b)(1) of the Act, 21 U.S.C. section 360bbb-3(b)(1), unless the authorization is terminated or revoked.  Performed at [facility], [address], [place], [place] [address]   Blood Culture (routine x 2)     Status: Abnormal   Collection Time: 10/29/23  2:14 PM   Specimen: BLOOD LEFT ARM  Result Value Ref Range Status   Specimen Description BLOOD LEFT ARM  Final   Special Requests   Final    BOTTLES DRAWN AEROBIC AND ANAEROBIC Blood Culture adequate volume   Culture  Setup Time   Final    GRAM NEGATIVE RODS IN BOTH AEROBIC AND ANAEROBIC BOTTLES CRITICAL RESULT CALLED TO, READ BACK BY AND VERIFIED WITH: PHARMD ANDREW MEYER ON 10/30/23 @ 1608 BY DRT Performed at [facility], [address], [place], [place] [address]    Culture (A)  Final    KLEBSIELLA PNEUMONIAE Confirmed Extended Spectrum  Beta-Lactamase Producer (ESBL).  In bloodstream infections from ESBL organisms, carbapenems are preferred over piperacillin /tazobactam. They are shown to have a lower risk of mortality.    Report Status 11/01/2023 FINAL  Final   Organism ID, Bacteria KLEBSIELLA PNEUMONIAE  Final      Susceptibility   Klebsiella pneumoniae - MIC*    AMPICILLIN >=32 RESISTANT Resistant     CEFEPIME  >=32 RESISTANT Resistant     CEFTAZIDIME  >=64 RESISTANT Resistant     CEFTRIAXONE  >=64 RESISTANT Resistant     CIPROFLOXACIN >=4 RESISTANT Resistant     GENTAMICIN >=16 RESISTANT Resistant     IMIPENEM 1 SENSITIVE Sensitive  TRIMETH /SULFA  >=320 RESISTANT Resistant     AMPICILLIN/SULBACTAM >=32 RESISTANT Resistant     PIP/TAZO 64 INTERMEDIATE Intermediate ug/mL    * KLEBSIELLA PNEUMONIAE  Blood Culture ID Panel (Reflexed)     Status: Abnormal   Collection Time: 10/29/23  2:14 PM  Result Value Ref Range Status   Enterococcus faecalis NOT DETECTED NOT DETECTED Final   Enterococcus Faecium NOT DETECTED NOT DETECTED Final   Listeria monocytogenes NOT DETECTED NOT DETECTED Final   Staphylococcus species NOT DETECTED NOT DETECTED Final   Staphylococcus aureus (BCID) NOT DETECTED NOT DETECTED Final   Staphylococcus epidermidis NOT DETECTED NOT DETECTED Final   Staphylococcus lugdunensis NOT DETECTED NOT DETECTED Final   Streptococcus species NOT DETECTED NOT DETECTED Final   Streptococcus agalactiae NOT DETECTED NOT DETECTED Final   Streptococcus pneumoniae NOT DETECTED NOT DETECTED Final   Streptococcus pyogenes NOT DETECTED NOT DETECTED Final   A.calcoaceticus-baumannii NOT DETECTED NOT DETECTED Final   Bacteroides fragilis NOT DETECTED NOT DETECTED Final   Enterobacterales DETECTED (A) NOT DETECTED Final    Comment: Enterobacterales represent a large order of gram negative bacteria, not a single organism. CRITICAL RESULT CALLED TO, READ BACK BY AND VERIFIED WITH: PHARMD ANDREW MEYER ON 10/30/23 @ 1608 BY  DRT    Enterobacter cloacae complex NOT DETECTED NOT DETECTED Final   Escherichia coli NOT DETECTED NOT DETECTED Final   Klebsiella aerogenes NOT DETECTED NOT DETECTED Final   Klebsiella oxytoca NOT DETECTED NOT DETECTED Final   Klebsiella pneumoniae DETECTED (A) NOT DETECTED Final    Comment: CRITICAL RESULT CALLED TO, READ BACK BY AND VERIFIED WITH: PHARMD ANDREW MEYER ON 10/30/23 @ 1608 BY DRT    Proteus species NOT DETECTED NOT DETECTED Final   Salmonella species NOT DETECTED NOT DETECTED Final   Serratia marcescens NOT DETECTED NOT DETECTED Final   Haemophilus influenzae NOT DETECTED NOT DETECTED Final   Neisseria meningitidis NOT DETECTED NOT DETECTED Final   Pseudomonas aeruginosa NOT DETECTED NOT DETECTED Final   Stenotrophomonas maltophilia NOT DETECTED NOT DETECTED Final   Candida albicans NOT DETECTED NOT DETECTED Final   Candida auris NOT DETECTED NOT DETECTED Final   Candida glabrata NOT DETECTED NOT DETECTED Final   Candida krusei NOT DETECTED NOT DETECTED Final   Candida parapsilosis NOT DETECTED NOT DETECTED Final   Candida tropicalis NOT DETECTED NOT DETECTED Final   Cryptococcus neoformans/gattii NOT DETECTED NOT DETECTED Final   CTX-M ESBL DETECTED (A) NOT DETECTED Final    Comment: CRITICAL RESULT CALLED TO, READ BACK BY AND VERIFIED WITH: PHARMD ANDREW MEYER ON 10/30/23 @ 1608 BY DRT (NOTE) Extended spectrum beta-lactamase detected. Recommend a carbapenem as initial therapy.      Carbapenem resistance IMP NOT DETECTED NOT DETECTED Final   Carbapenem resistance KPC NOT DETECTED NOT DETECTED Final   Carbapenem resistance NDM NOT DETECTED NOT DETECTED Final   Carbapenem resist OXA 48 LIKE NOT DETECTED NOT DETECTED Final   Carbapenem resistance VIM NOT DETECTED NOT DETECTED Final    Comment: Performed at [facility], [address] [place], [place] [address]  Blood Culture (routine x 2)     Status: None (Preliminary result)   Collection Time: 10/29/23   2:57 PM   Specimen: BLOOD LEFT ARM  Result Value Ref Range Status   Specimen Description BLOOD LEFT ARM  Final   Special Requests   Final    BOTTLES DRAWN AEROBIC AND ANAEROBIC Blood Culture results may not be optimal due to an inadequate volume of blood  received in culture bottles   Culture   Final    NO GROWTH 4 DAYS Performed at [facility], [address] [place], [place] [address]    Report Status PENDING  Incomplete  Respiratory (~20 pathogens) panel by PCR     Status: None   Collection Time: 10/29/23  5:31 PM   Specimen: Pleural; Respiratory  Result Value Ref Range Status   Adenovirus NOT DETECTED NOT DETECTED Final   Coronavirus 229E NOT DETECTED NOT DETECTED Final    Comment: (NOTE) The Coronavirus on the Respiratory Panel, DOES NOT test for the novel  Coronavirus (2019 nCoV)    Coronavirus HKU1 NOT DETECTED NOT DETECTED Final   Coronavirus NL63 NOT DETECTED NOT DETECTED Final   Coronavirus OC43 NOT DETECTED NOT DETECTED Final   Metapneumovirus NOT DETECTED NOT DETECTED Final   Rhinovirus / Enterovirus NOT DETECTED NOT DETECTED Final   Influenza A NOT DETECTED NOT DETECTED Final   Influenza B NOT DETECTED NOT DETECTED Final   Parainfluenza Virus 1 NOT DETECTED NOT DETECTED Final   Parainfluenza Virus 2 NOT DETECTED NOT DETECTED Final   Parainfluenza Virus 3 NOT DETECTED NOT DETECTED Final   Parainfluenza Virus 4 NOT DETECTED NOT DETECTED Final   Respiratory Syncytial Virus NOT DETECTED NOT DETECTED Final   Bordetella pertussis NOT DETECTED NOT DETECTED Final   Bordetella Parapertussis NOT DETECTED NOT DETECTED Final   Chlamydophila pneumoniae NOT DETECTED NOT DETECTED Final   Mycoplasma pneumoniae NOT DETECTED NOT DETECTED Final    Comment: Performed at [facility], [address], [place], [place] [address]  Body fluid culture w Gram Stain     Status: None   Collection Time: 10/29/23  7:10 PM   Specimen: Pleural Fluid  Result Value Ref Range Status    Specimen Description PLEURAL  Final   Special Requests NONE  Final   Gram Stain   Final    WBC PRESENT,BOTH PMN AND MONONUCLEAR NO ORGANISMS SEEN CYTOSPIN SMEAR    Culture   Final    NO GROWTH 3 DAYS Performed at [facility], [address] [place], [place] [address]    Report Status 11/02/2023 FINAL  Final  MRSA Next Gen by PCR, Nasal     Status: Abnormal   Collection Time: 10/29/23  7:30 PM  Result Value Ref Range Status   MRSA by PCR Next Gen DETECTED (A) NOT DETECTED Final    Comment: RESULT CALLED TO, READ BACK BY AND VERIFIED WITH:  W. HUTCHES 10/29/2023 @ 2217 BY AB (NOTE) The GeneXpert MRSA Assay (FDA approved for NASAL specimens only), is one component of a comprehensive MRSA colonization surveillance program. It is not intended to diagnose MRSA infection nor to guide or monitor treatment for MRSA infections. Test performance is not FDA approved in patients less than 64 years old. Performed at [facility], [address] [place], [place] [address]          Radiology Studies: No results found.       Scheduled Meds:  ascorbic acid   500 mg Oral Daily   atorvastatin   40 mg Oral QHS   Chlorhexidine  Gluconate Cloth  6 each Topical Daily   enoxaparin  (LOVENOX ) injection  1 mg/kg Subcutaneous Q12H   feeding supplement  237 mL Oral TID BM   folic acid   1 mg Oral Daily   gabapentin   300 mg Oral QHS   hydrocortisone   5 mg Oral BID   insulin  aspart  0-20 Units Subcutaneous Q4H   insulin  glargine-yfgn  12 Units Subcutaneous BID   lidocaine   1 patch Transdermal Q24H   midodrine   10 mg Oral TID with meals   multivitamin with minerals  1 tablet Oral Daily   mupirocin  ointment  1 Application Nasal BID   sodium chloride  flush  10-40 mL Intracatheter Q12H   thiamine   100 mg Oral Daily   zinc  sulfate (50mg  elemental zinc )  220 mg Oral Daily   Continuous Infusions:  meropenem  (MERREM ) IV 1 g (11/02/23 1400)     LOS: 4 days      Brayton Lye,  MD [facility]   To contact the attending provider between 7A-7P or the covering provider during after hours 7P-7A, please log into the web site [email or web address] and access using universal [place] password for that web site. If you do not have the password, please call the hospital operator.  11/02/2023, 2:26 PM

## 2023-11-03 DIAGNOSIS — R7881 Bacteremia: Secondary | ICD-10-CM | POA: Diagnosis not present

## 2023-11-03 DIAGNOSIS — Z1612 Extended spectrum beta lactamase (ESBL) resistance: Secondary | ICD-10-CM | POA: Diagnosis not present

## 2023-11-03 DIAGNOSIS — B961 Klebsiella pneumoniae [K. pneumoniae] as the cause of diseases classified elsewhere: Secondary | ICD-10-CM | POA: Diagnosis not present

## 2023-11-03 LAB — BASIC METABOLIC PANEL
Anion gap: 8 (ref 5–15)
BUN: 39 mg/dL — ABNORMAL HIGH (ref 8–23)
CO2: 23 mmol/L (ref 22–32)
Calcium: 7.9 mg/dL — ABNORMAL LOW (ref 8.9–10.3)
Chloride: 95 mmol/L — ABNORMAL LOW (ref 98–111)
Creatinine, Ser: 0.73 mg/dL (ref 0.61–1.24)
GFR, Estimated: >60 mL/min (ref >60)
Glucose, Bld: 114 mg/dL — ABNORMAL HIGH (ref 70–99)
Potassium: 4.1 mmol/L (ref 3.5–5.1)
Sodium: 126 mmol/L — ABNORMAL LOW (ref 135–145)

## 2023-11-03 LAB — CULTURE, BLOOD (ROUTINE X 2): Culture: NO GROWTH

## 2023-11-03 LAB — CBC
HCT: 21.2 % — ABNORMAL LOW (ref 39.0–52.0)
Hemoglobin: 7.3 g/dL — ABNORMAL LOW (ref 13.0–17.0)
MCH: 29.8 pg (ref 26.0–34.0)
MCHC: 34.4 g/dL (ref 30.0–36.0)
MCV: 86.5 fL (ref 80.0–100.0)
Platelets: 211 10*3/uL (ref 150–400)
RBC: 2.45 MIL/uL — ABNORMAL LOW (ref 4.22–5.81)
RDW: 17.4 % — ABNORMAL HIGH (ref 11.5–15.5)
WBC: 17.7 10*3/uL — ABNORMAL HIGH (ref 4.0–10.5)
nRBC: 0 % (ref 0.0–0.2)

## 2023-11-03 LAB — ZINC: Zinc: 29 ug/dL — ABNORMAL LOW (ref 44–115)

## 2023-11-03 LAB — GLUCOSE, CAPILLARY
Glucose-Capillary: 119 mg/dL — ABNORMAL HIGH (ref 70–99)
Glucose-Capillary: 100 mg/dL — ABNORMAL HIGH (ref 70–99)
Glucose-Capillary: 118 mg/dL — ABNORMAL HIGH (ref 70–99)
Glucose-Capillary: 170 mg/dL — ABNORMAL HIGH (ref 70–99)
Glucose-Capillary: 167 mg/dL — ABNORMAL HIGH (ref 70–99)
Glucose-Capillary: 119 mg/dL — ABNORMAL HIGH (ref 70–99)

## 2023-11-03 LAB — MAGNESIUM: Magnesium: 2.2 mg/dL (ref 1.7–2.4)

## 2023-11-03 LAB — PHOSPHORUS: Phosphorus: 2.1 mg/dL — ABNORMAL LOW (ref 2.5–4.6)

## 2023-11-03 LAB — PREPARE RBC (CROSSMATCH)

## 2023-11-03 MED ORDER — COLLAGENASE 250 UNIT/GM EX OINT
TOPICAL_OINTMENT | Freq: Every day | CUTANEOUS | Status: DC
Start: 2023-11-03 — End: 2023-11-09
  Filled 2023-11-03 (×4): qty 30

## 2023-11-03 MED ORDER — SODIUM CHLORIDE 0.9% IV SOLUTION: Freq: Once | INTRAVENOUS | Status: AC

## 2023-11-03 MED ORDER — MAGNESIUM CITRATE PO SOLN
1.0000 | Freq: Once | ORAL | Status: AC
  Administered 2023-11-03: 1 via ORAL
  Filled 2023-11-03: qty 296

## 2023-11-03 MED ORDER — ZINC OXIDE 40 % EX OINT
TOPICAL_OINTMENT | Freq: Two times a day (BID) | CUTANEOUS | Status: DC
  Filled 2023-11-03: qty 57

## 2023-11-03 NOTE — Plan of Care (Signed)
   Problem: Education: Goal: Ability to describe self-care measures that may prevent or decrease complications (Diabetes Survival Skills Education) will improve Outcome: Progressing Goal: Individualized Educational Video(s) Outcome: Progressing

## 2023-11-03 NOTE — Consult Note (Addendum)
 WOC Nurse wound follow up; see prior consult note 10/30/2023 Wound type: Deep Tissue Injury that has evolved to unstageable on Sacrum (POA, DTPI)  Measurement: 7 cm x 6 cm (unable to assess depth due to covered with necrotic tissue)  Wound bed: 100% black tan  Drainage (amount, consistency, odor) minimal tan exudate, not foul smelling  Periwound: erythema, MASD noted to skin around sacral wound and buttocks with erythema and scattered partial thickness skin loss  Dressing procedure/placement/frequency: Cleanse sacral wound with Vashe wound cleanser Soila [id number]), apply  1/4 thick layer of Santyl  to wound bed daily, top with saline moist gauze and dry gauze. Top with  ABD pad. Skin surrounding sacral wound and buttocks apply a thin layer of desitin 2 times a day and prn soiling.   Patient would benefit from a low air loss mattress for pressure redistribution.  POC discussed with bedside nurse.   Thank you,    Powell Bar MSN, RN-BC, [employer] [phone]

## 2023-11-03 NOTE — Progress Notes (Addendum)
 This chaplain responded to the RN consult for spiritual care in the setting of F/U on the Pt. DNR. The chaplain understands PMT NP-Shae completed a DNR with the Pt. the same day-Friday, the chaplain began conversations about the Pt. Advance Directive.  The chaplain followed up with the Pt. to discuss possible progress with naming a healthcare agent. The Pt. is lethargic during the visit and shares his pain is a 10 in his left abdomen area. At the time of the visit, the Pt. shares he talked to [name] on the phone but is not ready to make any decisions about his HCPOA today. The chaplain updated the Pt. RN-Dana.  This chaplain will attempt a revisit to clarify the Pt. choice of HCPOA.  Chaplain Leeroy Hummer [phone]

## 2023-11-03 NOTE — Plan of Care (Signed)
  Problem: Education: Goal: Ability to describe self-care measures that may prevent or decrease complications (Diabetes Survival Skills Education) will improve Outcome: Not Progressing Goal: Individualized Educational Video(s) Outcome: Not Progressing   Problem: Coping: Goal: Ability to adjust to condition or change in health will improve Outcome: Not Progressing   Problem: Fluid Volume: Goal: Ability to maintain a balanced intake and output will improve Outcome: Not Progressing   Problem: Health Behavior/Discharge Planning: Goal: Ability to identify and utilize available resources and services will improve Outcome: Not Progressing Goal: Ability to manage health-related needs will improve Outcome: Not Progressing   Problem: Metabolic: Goal: Ability to maintain appropriate glucose levels will improve Outcome: Not Progressing   Problem: Nutritional: Goal: Maintenance of adequate nutrition will improve Outcome: Not Progressing Goal: Progress toward achieving an optimal weight will improve Outcome: Not Progressing   Problem: Skin Integrity: Goal: Risk for impaired skin integrity will decrease Outcome: Not Progressing   Problem: Tissue Perfusion: Goal: Adequacy of tissue perfusion will improve Outcome: Not Progressing   Problem: Education: Goal: Knowledge of General Education information will improve Description: Including pain rating scale, medication(s)/side effects and non-pharmacologic comfort measures Outcome: Not Progressing   Problem: Health Behavior/Discharge Planning: Goal: Ability to manage health-related needs will improve Outcome: Not Progressing   Problem: Clinical Measurements: Goal: Ability to maintain clinical measurements within normal limits will improve Outcome: Not Progressing Goal: Will remain free from infection Outcome: Not Progressing Goal: Diagnostic test results will improve Outcome: Not Progressing Goal: Respiratory complications will  improve Outcome: Not Progressing Goal: Cardiovascular complication will be avoided Outcome: Not Progressing   Problem: Nutrition: Goal: Adequate nutrition will be maintained Outcome: Not Progressing   Problem: Activity: Goal: Risk for activity intolerance will decrease Outcome: Not Progressing   Problem: Coping: Goal: Level of anxiety will decrease Outcome: Not Progressing   Problem: Pain Management: Goal: General experience of comfort will improve Outcome: Not Progressing   Problem: Safety: Goal: Ability to remain free from injury will improve Outcome: Not Progressing   Problem: Skin Integrity: Goal: Risk for impaired skin integrity will decrease Outcome: Not Progressing

## 2023-11-03 NOTE — Discharge Instructions (Signed)
 Social Connections: [facility] [employer] offers a [employer] that anyone can call toll free at [phone]. The friendship line is available 24 hours a day and you can both call in whenever you like and receive calls from them.  211 also has on demand information for what resources are available in the area.

## 2023-11-03 NOTE — Progress Notes (Signed)
 PROGRESS NOTE    Peter Yang  FMW:[id number] DOB: November 11, 1955 DOA: 10/29/2023 PCP: Patient, No Pcp Per    Chief Complaint  Patient presents with   Altered Mental Status    Decreasing LOC since am     Brief Narrative:   Peter Yang is a 68 y/o gentleman with a history of debility requiring SNF/ rehab since BKA for necrotizing fasciitis, Fournier's gangrene, DM, chronic hypotension, ureterocutaneous fistula who presented with confusion, feeling hot, and hypotension from [facility]. He has been treated for several days with ciprofloxacin for UTI. He had gross hematuria and had his foley changed in the ED. He was initially confused, but was started on an epi infusion by EMS with improvement in his mentation. In the ED he became more confused. He is currently unarousable and unable to answer questions or provide additional history. Per his emergency contact, he has always been able to make his own decisions. In the ED he had 1/2 blood cultures obtained before antibiotics due to difficult access. He was started on Levophed , he was weaned off pressors transferred [facility] 1/11.   Assessment & Plan:   Principal Problem:   Bacteremia due to Klebsiella pneumoniae Active Problems:   Pleural effusion   Shock (HCC)   Protein-calorie malnutrition, severe   ESBL (extended spectrum beta-lactamase) producing bacteria infection   Septic shock due to ESBL bacteremia -Due to urinary source-cystitis - There was initially some concern pulmonary/parapneumonic effusion but effusion is transudative with negative gram stain so far. Cultures and cyto pending. RVP negative. -Weaned off pressors -Weaned off high-dose steroids, continue to taper gradually currently back at home dose of hydrocortisone  5 mg p.o. 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 -PICC line has been discontinued on 1/12, especially there is  no need for it and he remained with significant leukocytosis .  Anemia -Hemoglobin trending down, 10.5 on admission, it is 7.3 this morning, some delusional component to it, but he is on Eliquis , so I will check Hemoccult, and hold Eliquis  for now -I have discussed with the patient, will proceed with 1 unit PRBC transfusion  Pleural effusion -Transudative, follow on cytology and cultures  Chronic hypotension  - on Midodrine .  Hx PAF  - on Apixaban .  Gross hematuria  - improving.  Chronic foley  - s/p change in ED, some concern for fistulization  Chronic hydronephrosis  Acute metabolic encephalopathy  due to sepsis - improving. CT head negative.  AKI, pre-renal vs septic ATN.  Hyponatremia. -Continue with IV fluids  Hypophosphatemia -Low again today at 2.1, will replace  Hyperbilirubinemia, -  mild, due to sepsis  Severe protein calorie malnutrition -Will consult nutritionist, high risk for refeeding syndrome.  Phosphorus low again at 2.1 today, will replace  Hx DM. -Continue with insulin  sliding scale  Chronic pain syndrome -Altered mentation has resolved, resume home regimen  GOC -Palliative care input greatly appreciated, patient is currently DNR  Zinc  deficiency -Started on supplements  Pressure ulcers -Continue with wound care  Pressure Injury 10/29/23 Heel Right Deep Tissue Pressure Injury - Purple or maroon localized area of discolored intact skin or blood-filled blister due to damage of underlying soft tissue from pressure and/or shear. 3.5x2.5 purple (Active)  10/29/23 2000  Location: Heel  Location Orientation: Right  Staging: Deep Tissue Pressure Injury - Purple or maroon localized area of discolored intact skin or blood-filled blister due to damage of  underlying soft tissue from pressure and/or shear.  Wound Description (Comments): 3.5x2.5 purple  Present on Admission: Yes     Pressure Injury 10/30/23 Sacrum Medial Unstageable - Full thickness  tissue loss in which the base of the injury is covered by slough (yellow, tan, gray, green or brown) and/or eschar (tan, brown or black) in the wound bed. 7 cm x 6 cm 100% black tan necr (Active)  10/30/23 1002  Location: Sacrum  Location Orientation: Medial  Staging: Unstageable - Full thickness tissue loss in which the base of the injury is covered by slough (yellow, tan, gray, green or brown) and/or eschar (tan, brown or black) in the wound bed.  Wound Description (Comments): 7 cm x 6 cm 100% black tan necrotic tissue  Present on Admission: Yes      DVT prophylaxis: Lovenox  Code Status: DNR Family Communication: none at bedside Disposition:   Status is: Inpatient    Consultants:  PCCM ID Palliative medicine   Subjective:  No significant events overnight as discussed with staff, his pain is much controlled now he is back on his home regimen,  Objective: Vitals:   11/03/23 1354 11/03/23 1400 11/03/23 1409 11/03/23 1500  BP:   92/62   Pulse: (!) 106 (!) 103 (!) 107 (!) 103  Resp: (!) 22 14 14  (!) 24  Temp:   98.2 F (36.8 C)   TempSrc:   Oral   SpO2: (!) 83% 97% 99% 96%  Weight:      Height:        Intake/Output Summary (Last 24 hours) at 11/03/2023 1558 Last data filed at 11/03/2023 1200 Gross per 24 hour  Intake --  Output 1650 ml  Net -1650 ml   Filed Weights   10/30/23 0500 10/31/23 0447 11/03/23 0500  Weight: 88.5 kg 90.3 kg 91 kg    Examination:   Left upper chest PICC line will be discontinued 1/12, site bandaged  Awake Alert, Oriented X 3, limited frail, deconditioned, appears older than stated age Symmetrical Chest wall movement, Good air movement bilaterally, CTAB RRR,No Gallops,Rubs or new Murmurs, No Parasternal Heave +ve B.Sounds, Abd Soft, No tenderness, No rebound - guarding or rigidity. No Cyanosis, Clubbing or edema, left BKA, Foley catheter present, has skin bruising   Data Reviewed: I have personally reviewed following labs and  imaging studies  CBC: Recent Labs  Lab 10/29/23 1457 10/29/23 1730 10/29/23 2321 10/30/23 0403 10/31/23 0429 11/02/23 0241 11/03/23 0425  WBC 33.6*   < > 35.2* 36.6* 18.2* 23.7* 17.7*  NEUTROABS 31.9*  --   --   --   --   --   --   HGB 10.5*   < > 10.2* 10.4* 8.8* 9.5* 7.3*  HCT 32.4*   < > 31.0* 31.2* 26.8* 28.5* 21.2*  MCV 91.0   < > 90.1 88.9 88.7 86.9 86.5  PLT 227   < > 294 325 190 300 211   < > = values in this interval not displayed.    Basic Metabolic Panel: Recent Labs  Lab 10/30/23 0403 10/31/23 0429 11/01/23 0507 11/02/23 0241 11/03/23 0425  NA 130* 128* 127* 130* 126*  K 3.8 3.9 4.4 3.8 4.1  CL 96* 96* 97* 96* 95*  CO2 22 23 25 24 23   GLUCOSE 290* 243* 177* 95 114*  BUN 55* 49* 46* 42* 39*  CREATININE 1.36* 0.97 1.01 0.76 0.73  CALCIUM  7.9* 7.8* 7.7* 8.1* 7.9*  MG 1.7 1.9 2.4 2.5* 2.2  PHOS 3.7 2.4*  1.6* 2.4* 2.1*    GFR: Estimated Creatinine Clearance: 99.7 mL/min (by C-G formula based on SCr of 0.73 mg/dL).  Liver Function Tests: Recent Labs  Lab 10/29/23 1457 10/29/23 1925  AST 18  --   ALT 10  --   ALKPHOS 99  --   BILITOT 1.3*  --   PROT 5.7* 5.4*  ALBUMIN  2.1*  --     CBG: Recent Labs  Lab 11/02/23 2244 11/03/23 0357 11/03/23 0758 11/03/23 1154 11/03/23 1215  GLUCAP 77 119* 100* 118* 170*     Recent Results (from the past 240 hours)  Resp panel by RT-PCR (RSV, Flu A&B, Covid) Anterior Nasal Swab     Status: None   Collection Time: 10/29/23  2:09 PM   Specimen: Anterior Nasal Swab  Result Value Ref Range Status   SARS Coronavirus 2 by RT PCR NEGATIVE NEGATIVE Final   Influenza A by PCR NEGATIVE NEGATIVE Final   Influenza B by PCR NEGATIVE NEGATIVE Final    Comment: (NOTE) The Xpert Xpress SARS-CoV-2/FLU/RSV plus assay is intended as an aid in the diagnosis of influenza from Nasopharyngeal swab specimens and should not be used as a sole basis for treatment. Nasal washings and aspirates are unacceptable for Xpert Xpress  SARS-CoV-2/FLU/RSV testing.  Fact Sheet for Patients: bloggercourse.com  Fact Sheet for Healthcare Providers: seriousbroker.it  This test is not yet approved or cleared by the [place]  FDA and has been authorized for detection and/or diagnosis of SARS-CoV-2 by FDA under an Emergency Use Authorization (EUA). This EUA will remain in effect (meaning this test can be used) for the duration of the COVID-19 declaration under Section 564(b)(1) of the Act, 21 U.S.C. section 360bbb-3(b)(1), unless the authorization is terminated or revoked.     Resp Syncytial Virus by PCR NEGATIVE NEGATIVE Final    Comment: (NOTE) Fact Sheet for Patients: bloggercourse.com  Fact Sheet for Healthcare Providers: seriousbroker.it  This test is not yet approved or cleared by the [place]  FDA and has been authorized for detection and/or diagnosis of SARS-CoV-2 by FDA under an Emergency Use Authorization (EUA). This EUA will remain in effect (meaning this test can be used) for the duration of the COVID-19 declaration under Section 564(b)(1) of the Act, 21 U.S.C. section 360bbb-3(b)(1), unless the authorization is terminated or revoked.  Performed at [facility], [address] [place], [place] [address]   Blood Culture (routine x 2)     Status: Abnormal   Collection Time: 10/29/23  2:14 PM   Specimen: BLOOD LEFT ARM  Result Value Ref Range Status   Specimen Description BLOOD LEFT ARM  Final   Special Requests   Final    BOTTLES DRAWN AEROBIC AND ANAEROBIC Blood Culture adequate volume   Culture  Setup Time   Final    GRAM NEGATIVE RODS IN BOTH AEROBIC AND ANAEROBIC BOTTLES CRITICAL RESULT CALLED TO, READ BACK BY AND VERIFIED WITH: PHARMD ANDREW MEYER ON 10/30/23 @ 1608 BY DRT Performed at [facility], [address], [place], [place] [address]    Culture (A)  Final    KLEBSIELLA  PNEUMONIAE Confirmed Extended Spectrum Beta-Lactamase Producer (ESBL).  In bloodstream infections from ESBL organisms, carbapenems are preferred over piperacillin /tazobactam. They are shown to have a lower risk of mortality.    Report Status 11/01/2023 FINAL  Final   Organism ID, Bacteria KLEBSIELLA PNEUMONIAE  Final      Susceptibility   Klebsiella pneumoniae - MIC*    AMPICILLIN >=32  RESISTANT Resistant     CEFEPIME  >=32 RESISTANT Resistant     CEFTAZIDIME  >=64 RESISTANT Resistant     CEFTRIAXONE  >=64 RESISTANT Resistant     CIPROFLOXACIN >=4 RESISTANT Resistant     GENTAMICIN >=16 RESISTANT Resistant     IMIPENEM 1 SENSITIVE Sensitive     TRIMETH /SULFA  >=320 RESISTANT Resistant     AMPICILLIN/SULBACTAM >=32 RESISTANT Resistant     PIP/TAZO 64 INTERMEDIATE Intermediate ug/mL    * KLEBSIELLA PNEUMONIAE  Blood Culture ID Panel (Reflexed)     Status: Abnormal   Collection Time: 10/29/23  2:14 PM  Result Value Ref Range Status   Enterococcus faecalis NOT DETECTED NOT DETECTED Final   Enterococcus Faecium NOT DETECTED NOT DETECTED Final   Listeria monocytogenes NOT DETECTED NOT DETECTED Final   Staphylococcus species NOT DETECTED NOT DETECTED Final   Staphylococcus aureus (BCID) NOT DETECTED NOT DETECTED Final   Staphylococcus epidermidis NOT DETECTED NOT DETECTED Final   Staphylococcus lugdunensis NOT DETECTED NOT DETECTED Final   Streptococcus species NOT DETECTED NOT DETECTED Final   Streptococcus agalactiae NOT DETECTED NOT DETECTED Final   Streptococcus pneumoniae NOT DETECTED NOT DETECTED Final   Streptococcus pyogenes NOT DETECTED NOT DETECTED Final   A.calcoaceticus-baumannii NOT DETECTED NOT DETECTED Final   Bacteroides fragilis NOT DETECTED NOT DETECTED Final   Enterobacterales DETECTED (A) NOT DETECTED Final    Comment: Enterobacterales represent a large order of gram negative bacteria, not a single organism. CRITICAL RESULT CALLED TO, READ BACK BY AND VERIFIED  WITH: PHARMD ANDREW MEYER ON 10/30/23 @ 1608 BY DRT    Enterobacter cloacae complex NOT DETECTED NOT DETECTED Final   Escherichia coli NOT DETECTED NOT DETECTED Final   Klebsiella aerogenes NOT DETECTED NOT DETECTED Final   Klebsiella oxytoca NOT DETECTED NOT DETECTED Final   Klebsiella pneumoniae DETECTED (A) NOT DETECTED Final    Comment: CRITICAL RESULT CALLED TO, READ BACK BY AND VERIFIED WITH: PHARMD ANDREW MEYER ON 10/30/23 @ 1608 BY DRT    Proteus species NOT DETECTED NOT DETECTED Final   Salmonella species NOT DETECTED NOT DETECTED Final   Serratia marcescens NOT DETECTED NOT DETECTED Final   Haemophilus influenzae NOT DETECTED NOT DETECTED Final   Neisseria meningitidis NOT DETECTED NOT DETECTED Final   Pseudomonas aeruginosa NOT DETECTED NOT DETECTED Final   Stenotrophomonas maltophilia NOT DETECTED NOT DETECTED Final   Candida albicans NOT DETECTED NOT DETECTED Final   Candida auris NOT DETECTED NOT DETECTED Final   Candida glabrata NOT DETECTED NOT DETECTED Final   Candida krusei NOT DETECTED NOT DETECTED Final   Candida parapsilosis NOT DETECTED NOT DETECTED Final   Candida tropicalis NOT DETECTED NOT DETECTED Final   Cryptococcus neoformans/gattii NOT DETECTED NOT DETECTED Final   CTX-M ESBL DETECTED (A) NOT DETECTED Final    Comment: CRITICAL RESULT CALLED TO, READ BACK BY AND VERIFIED WITH: PHARMD ANDREW MEYER ON 10/30/23 @ 1608 BY DRT (NOTE) Extended spectrum beta-lactamase detected. Recommend a carbapenem as initial therapy.      Carbapenem resistance IMP NOT DETECTED NOT DETECTED Final   Carbapenem resistance KPC NOT DETECTED NOT DETECTED Final   Carbapenem resistance NDM NOT DETECTED NOT DETECTED Final   Carbapenem resist OXA 48 LIKE NOT DETECTED NOT DETECTED Final   Carbapenem resistance VIM NOT DETECTED NOT DETECTED Final    Comment: Performed at [facility], [address], [place], [place] [address]  Blood Culture (routine x 2)     Status: None    Collection Time: 10/29/23  2:57 PM   Specimen: BLOOD LEFT ARM  Result Value Ref Range Status   Specimen Description BLOOD LEFT ARM  Final   Special Requests   Final    BOTTLES DRAWN AEROBIC AND ANAEROBIC Blood Culture results may not be optimal due to an inadequate volume of blood received in culture bottles   Culture   Final    NO GROWTH 5 DAYS Performed at [facility], [address] [place], [place] [address]    Report Status 11/03/2023 FINAL  Final  Respiratory (~20 pathogens) panel by PCR     Status: None   Collection Time: 10/29/23  5:31 PM   Specimen: Pleural; Respiratory  Result Value Ref Range Status   Adenovirus NOT DETECTED NOT DETECTED Final   Coronavirus 229E NOT DETECTED NOT DETECTED Final    Comment: (NOTE) The Coronavirus on the Respiratory Panel, DOES NOT test for the novel  Coronavirus (2019 nCoV)    Coronavirus HKU1 NOT DETECTED NOT DETECTED Final   Coronavirus NL63 NOT DETECTED NOT DETECTED Final   Coronavirus OC43 NOT DETECTED NOT DETECTED Final   Metapneumovirus NOT DETECTED NOT DETECTED Final   Rhinovirus / Enterovirus NOT DETECTED NOT DETECTED Final   Influenza A NOT DETECTED NOT DETECTED Final   Influenza B NOT DETECTED NOT DETECTED Final   Parainfluenza Virus 1 NOT DETECTED NOT DETECTED Final   Parainfluenza Virus 2 NOT DETECTED NOT DETECTED Final   Parainfluenza Virus 3 NOT DETECTED NOT DETECTED Final   Parainfluenza Virus 4 NOT DETECTED NOT DETECTED Final   Respiratory Syncytial Virus NOT DETECTED NOT DETECTED Final   Bordetella pertussis NOT DETECTED NOT DETECTED Final   Bordetella Parapertussis NOT DETECTED NOT DETECTED Final   Chlamydophila pneumoniae NOT DETECTED NOT DETECTED Final   Mycoplasma pneumoniae NOT DETECTED NOT DETECTED Final    Comment: Performed at [facility], [address], [place], [place] [address]  Body fluid culture w Gram Stain     Status: None   Collection Time: 10/29/23  7:10 PM   Specimen: Pleural Fluid   Result Value Ref Range Status   Specimen Description PLEURAL  Final   Special Requests NONE  Final   Gram Stain   Final    WBC PRESENT,BOTH PMN AND MONONUCLEAR NO ORGANISMS SEEN CYTOSPIN SMEAR    Culture   Final    NO GROWTH 3 DAYS Performed at [facility], [address], [place], [place] [address]    Report Status 11/02/2023 FINAL  Final  MRSA Next Gen by PCR, Nasal     Status: Abnormal   Collection Time: 10/29/23  7:30 PM  Result Value Ref Range Status   MRSA by PCR Next Gen DETECTED (A) NOT DETECTED Final    Comment: RESULT CALLED TO, READ BACK BY AND VERIFIED WITH:  W. HUTCHES 10/29/2023 @ 2217 BY AB (NOTE) The GeneXpert MRSA Assay (FDA approved for NASAL specimens only), is one component of a comprehensive MRSA colonization surveillance program. It is not intended to diagnose MRSA infection nor to guide or monitor treatment for MRSA infections. Test performance is not FDA approved in patients less than 91 years old. Performed at [facility], [address] [place], [place] [address]          Radiology Studies: No results found.       Scheduled Meds:  ascorbic acid   500 mg Oral Daily   atorvastatin   40 mg Oral QHS   Chlorhexidine  Gluconate Cloth  6  each Topical Daily   collagenase    Topical Daily   feeding supplement  237 mL Oral TID BM   folic acid   1 mg Oral Daily   gabapentin   300 mg Oral QHS   hydrocortisone   5 mg Oral BID   insulin  aspart  0-20 Units Subcutaneous Q4H   insulin  glargine-yfgn  5 Units Subcutaneous BID   liver oil-zinc  oxide   Topical BID   magnesium  citrate  1 Bottle Oral Once   midodrine   10 mg Oral TID with meals   multivitamin with minerals  1 tablet Oral Daily   mupirocin  ointment  1 Application Nasal BID   oxyCODONE   15 mg Oral Q12H   sodium chloride  flush  10-40 mL Intracatheter Q12H   thiamine   100 mg Oral Daily   zinc  sulfate (50mg  elemental zinc )  220 mg Oral Daily   Continuous Infusions:  meropenem   (MERREM ) IV 1 g (11/03/23 0618)     LOS: 5 days      Brayton Lye, MD [facility]   To contact the attending provider between 7A-7P or the covering provider during after hours 7P-7A, please log into the web site [email or web address] and access using universal  password for that web site. If you do not have the password, please call the hospital operator.  11/03/2023, 3:58 PM

## 2023-11-03 NOTE — [place] FL2 (Signed)
 [place]  MEDICAID FL2 LEVEL OF CARE FORM     IDENTIFICATION  Patient Name: Peter Yang Birthdate: [birth year] Sex: male Admission Date (Current Location): 10/29/2023  [facility] and [place] Number:  Producer, Television/film/video and Address:  [facility], [address], [place], [place] [address]      Provider Number: [id number]  Attending Physician Name and Address:  Elgergawy, Brayton RAMAN, MD  Relative Name and Phone Number:       Current Level of Care: Hospital Recommended Level of Care: Skilled Nursing Facility Prior Approval Number:    Date Approved/Denied:   PASRR Number: [id number]  Discharge Plan: SNF    Current Diagnoses: Patient Active Problem List   Diagnosis Date Noted   ESBL (extended spectrum beta-lactamase) producing bacteria infection 10/31/2023   Bacteremia due to Klebsiella pneumoniae 10/30/2023   Protein-calorie malnutrition, severe 10/30/2023   Shock (HCC) 10/29/2023   History of MDR Pseudomonas aeruginosa infection 06/07/2023   Malnutrition of moderate degree 06/06/2023   Wound dehiscence in puerperium, perineal 06/04/2023   AMS (altered mental status) 05/07/2023   Seizure-like activity (HCC) 05/06/2023   Altered mental status 05/06/2023   Fournier's gangrene in male 04/02/2023   Soft tissue infection 04/02/2023   Necrotizing soft tissue infection 04/02/2023   UTI (urinary tract infection) due to urinary indwelling catheter (HCC) 04/01/2023   S/P BKA (below knee amputation) unilateral, left (HCC) 04/01/2023   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) 04/01/2023   Long term current use of systemic steroids 04/01/2023   Thrombocytopenia (HCC) 10/01/2022   Orthostatic hypotension 09/24/2022   HFrEF (heart failure with reduced ejection fraction) (HCC) 09/23/2022   Counseling and coordination of care 09/15/2022   Pain 09/14/2022   High risk medication use 09/14/2022   Need for emotional support  09/12/2022   Mood altered 09/11/2022   Medication management 09/11/2022   Palliative care by specialist 09/08/2022   Goals of care, counseling/discussion 09/08/2022   Physical deconditioning 09/05/2022   Gross hematuria 08/31/2022   Type 2 diabetes mellitus with diabetic peripheral angiopathy without gangrene, without long-term current use of insulin  (HCC)    Impaired functional mobility, balance, gait, and endurance 08/03/2022   Obstructive sleep apnea syndrome 08/03/2022   Pleural effusion 08/03/2022   Catheter-associated urinary tract infection (HCC) 08/03/2022   History of urinary retention 08/03/2022   Mitral valve regurgitation 08/03/2022   Thoracic ascending aortic aneurysm (HCC) 07/23/2022   Borderline low blood pressure determined by examination 07/04/2022   Coronary artery disease due to lipid rich plaque 06/25/2022   Hematuria 06/25/2022   History of non-ST elevation myocardial infarction (NSTEMI) 06/15/2022   PAF (paroxysmal atrial fibrillation) (HCC) 06/15/2022   Dyslipidemia 06/15/2022   PVD (peripheral vascular disease) (HCC) 06/15/2022   Pressure ulcer of sacral region, stage 2 (HCC) 06/15/2022   Chronic HFrEF (heart failure with reduced ejection fraction) (HCC) 06/15/2022   DNR (do not resuscitate) 06/15/2022   Ischemic cardiomyopathy 02/12/2022   Need for assistance with personal care 08/03/2021   Diabetic polyneuropathy associated with type 2 diabetes mellitus (HCC) 07/28/2021   Prolonged QT interval 07/28/2021   Moderate protein-calorie malnutrition (HCC) 06/29/2021   Chronic insomnia 03/31/2021   BPH with obstruction/lower urinary tract symptoms 02/02/2018   Hyperlipidemia 04/15/2016   Vitamin D  deficiency 04/09/2016   Essential hypertension 01/25/2016    Orientation RESPIRATION BLADDER Height & Weight     Self, Time, Situation, Place  Normal Incontinent Weight: 200 lb 9.9 oz (  91 kg) Height:  6' (182.9 cm)  BEHAVIORAL SYMPTOMS/MOOD NEUROLOGICAL BOWEL  NUTRITION STATUS      Incontinent Diet (See dc summary)  AMBULATORY STATUS COMMUNICATION OF NEEDS Skin   Extensive Assist   PU Stage and Appropriate Care, Other (Comment) (Deep tissue on heel; unstageable on sacrum; non pressure wound on hand and knee)                       Personal Care Assistance Level of Assistance  Bathing, Feeding, Dressing Bathing Assistance: Maximum assistance Feeding assistance: Limited assistance Dressing Assistance: Maximum assistance     Functional Limitations Info             SPECIAL CARE FACTORS FREQUENCY                       Contractures Contractures Info: Not present    Additional Factors Info  Code Status, Allergies, Isolation Precautions Code Status Info: DNR Allergies Info: Sglt2 Inhibitors     Isolation Precautions Info: ESBL; MRSA     Current Medications (11/03/2023):  This is the current hospital active medication list Current Facility-Administered Medications  Medication Dose Route Frequency Provider Last Rate Last Admin   0.9 %  sodium chloride  infusion (Manually program via Guardrails IV Fluids)   Intravenous Once Elgergawy, Brayton RAMAN, MD       acetaminophen  (TYLENOL ) tablet 650 mg  650 mg Oral Q6H PRN Haze Led, MD   650 mg at 11/01/23 0342   ascorbic acid  (VITAMIN C ) tablet 500 mg  500 mg Oral Daily Gretta Doffing P, DO   500 mg at 11/03/23 0831   atorvastatin  (LIPITOR ) tablet 40 mg  40 mg Oral QHS Desai, Rahul P, PA-C   40 mg at 11/02/23 2236   Chlorhexidine  Gluconate Cloth 2 % PADS 6 each  6 each Topical Daily Gretta Doffing SQUIBB, DO   6 each at 11/03/23 9167   collagenase  (SANTYL ) ointment   Topical Daily Elgergawy, Dawood S, MD       dextrose  50 % solution 50 mL  50 mL Intravenous PRN Shona Laurence N, DO       docusate sodium  (COLACE) capsule 100 mg  100 mg Oral BID PRN Gretta Doffing P, DO       feeding supplement (ENSURE ENLIVE / ENSURE PLUS) liquid 237 mL  237 mL Oral TID BM Gretta Doffing P, DO   237 mL at 11/03/23  9166   folic acid  (FOLVITE ) tablet 1 mg  1 mg Oral Daily Gretta Doffing P, DO   1 mg at 11/03/23 0831   gabapentin  (NEURONTIN ) capsule 300 mg  300 mg Oral QHS Elgergawy, Dawood S, MD   300 mg at 11/02/23 2236   hydrocortisone  (CORTEF ) tablet 5 mg  5 mg Oral BID Claudene Toribio BROCKS, MD   5 mg at 11/03/23 0830   hyoscyamine  (ANASPAZ ) disintergrating tablet 0.125 mg  0.125 mg Sublingual Q4H PRN Delia Ole ORN, NP       insulin  aspart (novoLOG ) injection 0-20 Units  0-20 Units Subcutaneous Q4H Claudene Toribio BROCKS, MD   3 Units at 11/02/23 1822   insulin  glargine-yfgn (SEMGLEE ) injection 5 Units  5 Units Subcutaneous BID Shona Laurence N, DO       liver oil-zinc  oxide (DESITIN) 40 % ointment   Topical BID Elgergawy, Dawood S, MD       meropenem  (MERREM ) 1 g in sodium chloride  0.9 % 100 mL IVPB  1 g Intravenous  Q8H Fleeta Rothman, Jomarie SAILOR, MD 200 mL/hr at 11/03/23 0618 1 g at 11/03/23 9381   midodrine  (PROAMATINE ) tablet 10 mg  10 mg Oral TID with meals Meade, Rahul P, PA-C   10 mg at 11/03/23 1233   multivitamin with minerals tablet 1 tablet  1 tablet Oral Daily Claudene Toribio BROCKS, MD   1 tablet at 11/03/23 0830   mupirocin  ointment (BACTROBAN ) 2 % 1 Application  1 Application Nasal BID Claudene Toribio BROCKS, MD   1 Application at 11/03/23 0833   ondansetron  (ZOFRAN ) injection 4 mg  4 mg Intravenous Q6H PRN Gretta Leita SQUIBB, DO       Oral care mouth rinse  15 mL Mouth Rinse PRN Gretta Leita P, DO       oxyCODONE  (Oxy IR/ROXICODONE ) immediate release tablet 5 mg  5 mg Oral Q6H PRN Elgergawy, Dawood S, MD   5 mg at 11/02/23 1355   oxyCODONE  (OXYCONTIN ) 12 hr tablet 15 mg  15 mg Oral Q12H Elgergawy, Dawood S, MD   15 mg at 11/03/23 0830   polyethylene glycol (MIRALAX  / GLYCOLAX ) packet 17 g  17 g Oral Daily PRN Gretta Leita P, DO       sodium chloride  flush (NS) 0.9 % injection 10-40 mL  10-40 mL Intracatheter Q12H Claudene Toribio BROCKS, MD   10 mL at 11/02/23 9061   sodium chloride  flush (NS) 0.9 % injection 10-40 mL  10-40 mL  Intracatheter PRN Claudene Toribio BROCKS, MD       thiamine  (VITAMIN B1) tablet 100 mg  100 mg Oral Daily Gretta Leita P, DO   100 mg at 11/03/23 0831   zinc  sulfate (50mg  elemental zinc ) capsule 220 mg  220 mg Oral Daily Gretta Leita SQUIBB, DO   220 mg at 11/03/23 9168     Discharge Medications: Please see discharge summary for a list of discharge medications.  Relevant Imaging Results:  Relevant Lab Results:   Additional Information SSN # [id number]  Inocente GORMAN Kindle, LCSW

## 2023-11-04 ENCOUNTER — Other Ambulatory Visit: Payer: Self-pay

## 2023-11-04 DIAGNOSIS — R579 Shock, unspecified: Secondary | ICD-10-CM | POA: Diagnosis not present

## 2023-11-04 DIAGNOSIS — B961 Klebsiella pneumoniae [K. pneumoniae] as the cause of diseases classified elsewhere: Secondary | ICD-10-CM | POA: Diagnosis not present

## 2023-11-04 DIAGNOSIS — E43 Unspecified severe protein-calorie malnutrition: Secondary | ICD-10-CM | POA: Diagnosis not present

## 2023-11-04 DIAGNOSIS — R7881 Bacteremia: Secondary | ICD-10-CM | POA: Diagnosis not present

## 2023-11-04 LAB — CBC
HCT: 22.3 % — ABNORMAL LOW (ref 39.0–52.0)
HCT: 24.2 % — ABNORMAL LOW (ref 39.0–52.0)
Hemoglobin: 7.6 g/dL — ABNORMAL LOW (ref 13.0–17.0)
Hemoglobin: 8.2 g/dL — ABNORMAL LOW (ref 13.0–17.0)
MCH: 29 pg (ref 26.0–34.0)
MCH: 28.8 pg (ref 26.0–34.0)
MCHC: 34.1 g/dL (ref 30.0–36.0)
MCHC: 33.9 g/dL (ref 30.0–36.0)
MCV: 85.1 fL (ref 80.0–100.0)
MCV: 84.9 fL (ref 80.0–100.0)
Platelets: 209 10*3/uL (ref 150–400)
Platelets: 167 10*3/uL (ref 150–400)
RBC: 2.62 MIL/uL — ABNORMAL LOW (ref 4.22–5.81)
RBC: 2.85 MIL/uL — ABNORMAL LOW (ref 4.22–5.81)
RDW: 20.4 % — ABNORMAL HIGH (ref 11.5–15.5)
RDW: 19.5 % — ABNORMAL HIGH (ref 11.5–15.5)
WBC: 18.8 10*3/uL — ABNORMAL HIGH (ref 4.0–10.5)
WBC: 16.1 10*3/uL — ABNORMAL HIGH (ref 4.0–10.5)
nRBC: 0 % (ref 0.0–0.2)
nRBC: 0 % (ref 0.0–0.2)

## 2023-11-04 LAB — BASIC METABOLIC PANEL
Anion gap: 9 (ref 5–15)
BUN: 40 mg/dL — ABNORMAL HIGH (ref 8–23)
CO2: 23 mmol/L (ref 22–32)
Calcium: 8 mg/dL — ABNORMAL LOW (ref 8.9–10.3)
Chloride: 99 mmol/L (ref 98–111)
Creatinine, Ser: 0.99 mg/dL (ref 0.61–1.24)
GFR, Estimated: >60 mL/min (ref >60)
Glucose, Bld: 125 mg/dL — ABNORMAL HIGH (ref 70–99)
Potassium: 4.2 mmol/L (ref 3.5–5.1)
Sodium: 131 mmol/L — ABNORMAL LOW (ref 135–145)

## 2023-11-04 LAB — LACTIC ACID, PLASMA
Lactic Acid, Venous: 1.6 mmol/L (ref 0.5–1.9)
Lactic Acid, Venous: 1.5 mmol/L (ref 0.5–1.9)

## 2023-11-04 LAB — VITAMIN C: Vitamin C: 1.6 mg/dL (ref 0.4–2.0)

## 2023-11-04 LAB — BLOOD GAS, VENOUS
Acid-Base Excess: 2.9 mmol/L — ABNORMAL HIGH (ref 0.0–2.0)
Bicarbonate: 25.9 mmol/L (ref 20.0–28.0)
O2 Saturation: 74 %
Patient temperature: 37
pCO2, Ven: 34 mm[Hg] — ABNORMAL LOW (ref 44–60)
pH, Ven: 7.49 — ABNORMAL HIGH (ref 7.25–7.43)
pO2, Ven: 40 mm[Hg] (ref 32–45)

## 2023-11-04 LAB — GLUCOSE, CAPILLARY
Glucose-Capillary: 116 mg/dL — ABNORMAL HIGH (ref 70–99)
Glucose-Capillary: 124 mg/dL — ABNORMAL HIGH (ref 70–99)
Glucose-Capillary: 144 mg/dL — ABNORMAL HIGH (ref 70–99)
Glucose-Capillary: 150 mg/dL — ABNORMAL HIGH (ref 70–99)
Glucose-Capillary: 152 mg/dL — ABNORMAL HIGH (ref 70–99)
Glucose-Capillary: 156 mg/dL — ABNORMAL HIGH (ref 70–99)
Glucose-Capillary: 158 mg/dL — ABNORMAL HIGH (ref 70–99)

## 2023-11-04 LAB — OCCULT BLOOD X 1 CARD TO LAB, STOOL: Fecal Occult Bld: NEGATIVE

## 2023-11-04 LAB — PHOSPHORUS: Phosphorus: 2.7 mg/dL (ref 2.5–4.6)

## 2023-11-04 LAB — MAGNESIUM: Magnesium: 2.6 mg/dL — ABNORMAL HIGH (ref 1.7–2.4)

## 2023-11-04 LAB — PROCALCITONIN: Procalcitonin: 1.98 ng/mL

## 2023-11-04 LAB — PREPARE RBC (CROSSMATCH)

## 2023-11-04 MED ORDER — NALOXONE HCL 0.4 MG/ML IJ SOLN
0.4000 mg | Freq: Once | INTRAMUSCULAR | Status: AC
  Administered 2023-11-04: 0.4 mg via INTRAVENOUS
  Filled 2023-11-04: qty 1

## 2023-11-04 MED ORDER — ALBUMIN HUMAN 25 % IV SOLN
25.0000 g | INTRAVENOUS | Status: AC
  Administered 2023-11-04: 25 g via INTRAVENOUS
  Filled 2023-11-04: qty 100

## 2023-11-04 MED ORDER — SODIUM CHLORIDE 0.9 % IV BOLUS
250.0000 mL | INTRAVENOUS | Status: AC
  Administered 2023-11-04: 250 mL via INTRAVENOUS

## 2023-11-04 MED ORDER — GABAPENTIN 100 MG PO CAPS: 100.0000 mg | ORAL_CAPSULE | Freq: Every day | ORAL | Status: DC

## 2023-11-04 MED ORDER — NALOXONE HCL 0.4 MG/ML IJ SOLN
0.4000 mg | INTRAMUSCULAR | Status: DC | PRN
  Administered 2023-11-04: 0.4 mg via INTRAVENOUS

## 2023-11-04 MED ORDER — ACETAMINOPHEN 325 MG PO TABS: 650.0000 mg | ORAL_TABLET | Freq: Four times a day (QID) | ORAL | Status: DC | PRN

## 2023-11-04 MED ORDER — PANTOPRAZOLE SODIUM 40 MG IV SOLR
40.0000 mg | Freq: Two times a day (BID) | INTRAVENOUS | Status: DC
  Administered 2023-11-04 – 2023-11-05 (×3): 40 mg via INTRAVENOUS
  Filled 2023-11-04 (×3): qty 10

## 2023-11-04 MED ORDER — LACTATED RINGERS IV SOLN: INTRAVENOUS | Status: DC

## 2023-11-04 MED ORDER — HYDROCORTISONE SOD SUC (PF) 100 MG IJ SOLR
100.0000 mg | Freq: Three times a day (TID) | INTRAMUSCULAR | Status: DC
  Administered 2023-11-04 – 2023-11-05 (×4): 100 mg via INTRAVENOUS
  Filled 2023-11-04 (×4): qty 2

## 2023-11-04 MED ORDER — NALOXONE HCL 0.4 MG/ML IJ SOLN
INTRAMUSCULAR | Status: AC
  Filled 2023-11-04: qty 1

## 2023-11-04 MED ORDER — SODIUM CHLORIDE 0.9 % IV BOLUS
250.0000 mL | Freq: Once | INTRAVENOUS | Status: AC
  Administered 2023-11-04: 250 mL via INTRAVENOUS

## 2023-11-04 MED ORDER — MIDODRINE HCL 5 MG PO TABS
10.0000 mg | ORAL_TABLET | ORAL | Status: AC
  Administered 2023-11-04: 10 mg via ORAL

## 2023-11-04 MED ORDER — SODIUM CHLORIDE 0.9% IV SOLUTION: Freq: Once | INTRAVENOUS | Status: AC

## 2023-11-04 MED ORDER — ALBUMIN HUMAN 25 % IV SOLN
25.0000 g | Freq: Four times a day (QID) | INTRAVENOUS | Status: AC
  Administered 2023-11-04 – 2023-11-05 (×4): 25 g via INTRAVENOUS
  Filled 2023-11-04 (×4): qty 100

## 2023-11-04 NOTE — Plan of Care (Signed)
  Problem: Coping: Goal: Ability to adjust to condition or change in health will improve Outcome: Progressing   Problem: Skin Integrity: Goal: Risk for impaired skin integrity will decrease Outcome: Progressing   Problem: Tissue Perfusion: Goal: Adequacy of tissue perfusion will improve Outcome: Progressing   Problem: Clinical Measurements: Goal: Will remain free from infection Outcome: Progressing

## 2023-11-04 NOTE — Plan of Care (Signed)

## 2023-11-04 NOTE — Progress Notes (Signed)
 Received a call from bedside RN regarding the patient being hypersomnolent and needing more verbal tactile stimulation to arouse.  Presented at bedside.  The patient is only responsive to painful stimuli and not able to maintain alertness.  Hyperventilating.  Hypotensive with MAP in the 50s.  NS 500 cc IV fluid bolus, IV albumin  25 g x 1, and 10 mg midodrine  ordered.  Due to persistent hyper somnolence and minimally responding to painful stimuli 1 dose of IV Narcan  0.4 mg was administered with improvement.  The patient is now alert after IV Narcan  and is able to take his p.o. midodrine .  Blood pressure is improving with MAP above 65.  Dose of 15 mg OxyContin  twice daily held.  Dose of gabapentin  reduced to 100 mg nightly from 300 mg nightly.  On exam, lungs are clear to auscultation.  Tachycardic.  Trace lower extremity edema bilaterally.  Mood is appropriate for condition setting.  Foley catheter is in place with tea colored urine.   Critical care time: 35 minutes.

## 2023-11-04 NOTE — Progress Notes (Signed)
 PROGRESS NOTE    Peter Yang  FMW:[id number] DOB: 1956/05/24 DOA: 10/29/2023 PCP: Peter Yang    Chief Complaint  Patient presents with   Altered Mental Status    Decreasing LOC since am     Brief Narrative:   Peter Yang is a 68 y/o gentleman with a history of debility requiring SNF/ rehab since BKA for necrotizing fasciitis, Fournier's gangrene, DM, chronic hypotension, ureterocutaneous fistula who presented with confusion, feeling hot, and hypotension from [place]. He has been treated for several days with ciprofloxacin for UTI. He had gross hematuria and had his foley changed in the ED. He was initially confused, but was started on an epi infusion by EMS with improvement in his mentation. In the ED he became more confused. Yang his emergency contact, he has always been able to make his own decisions. In the ED he had 1/2 blood cultures obtained before antibiotics due to difficult access. He was started on Levophed , central line inserted, and admitted to ICU, workup was significant for ESBL bacteremia, thought to be secondary to urinary source, he was weaned off his epinephrine, transferred to progressive care under [facility] 11/01/2023, all patient with significant pain and discomfort, so he was resumed back on his home regimen including OxyContin  and as needed oxycodone , overall he had poor appetite, failure to thrive, patient overnight was hypotensive again, encephalopathic as well, as well he did develop anemia, he is Hemoccult negative, receiving supportive transfusion, have discussed with his friend-HCPOA), continue with current measures, with no escalation of care, no central lines, aggressive procedures or pressors, allow time for this management, but if no improvement then we will proceed for full comfort measures, palliative medicine will reengaged and they will evaluate tomorrow.  Assessment & Plan:   Principal Problem:   Bacteremia due to Klebsiella  pneumoniae Active Problems:   Pleural effusion   Shock (HCC)   Protein-calorie malnutrition, severe   ESBL (extended spectrum beta-lactamase) producing bacteria infection   Septic shock due to ESBL bacteremia -Due to urinary source-cystitis - There was initially some concern pulmonary/parapneumonic effusion but effusion is transudative with negative gram stain so far. Cultures and cyto pending. RVP negative. -Weaned off pressors -Back on his home dose hydrocortisone , weaned off stress steroids  -He is on midodrine   -He is on midodrine  -ID input greatly appreciated, known history of ESBL in the past, will need meropenem  x 7 days. -PICC line has been discontinued on 1/12, especially there is no need for it and he remained with significant leukocytosis . -Patient hypotensive overnight, encephalopathic, appears to be in shock again, blood pressure did partially respond to IV fluid boluses, IV albumin , midodrine , but overall remains soft, currently on IV steroids, IV albumin , midodrine , as well blood transfusions for anemia and IV fluids, have discussed with his HCPOA, patient with poor life quality, multiple admissions and hospitalizations over last 2 years, he is significantly uncomfortable at baseline, so plan is to continue with current measures, with no escalation of cares, no central lines, no pressors, no transfer to ICU, to continue to monitor closely on progressive care unit to see if he responds to current measures, but if he deteriorates, or fail to respond to current measures, then will transition to full comfort measures, I have discussed this plan in details with HCPOA, and she is in full agreement, will reengage palliative medicine.  Anemia -Globin 10.5 on admission, gradually dropping, initially thought to be delusional, but he did have significant drop over last 48  hours, he is Hemoccult negative, no evidence of GI bleed, so far he was transfused 1 units PRBC, hemoglobin this morning  at 7.3, will transfuse another unit PRBC and recheck CBC after that and transfuse further if needed . -Will hold Eliquis   -Hemoccult negative. -Continue with supportive transfusion.  Pleural effusion -Transudative, follow on cytology and cultures  Chronic hypotension  - on Midodrine .  Hx PAF  -Eliquis  on hold  Gross hematuria  - improving.  Chronic foley  - s/p change in ED, some concern for fistulization  Chronic hydronephrosis  Acute metabolic encephalopathy  due to sepsis - improving. CT head negative.  AKI, pre-renal vs septic ATN.  Hyponatremia. -Continue with IV fluids  Hypophosphatemia -Low again today at 2.1, will replace  Hyperbilirubinemia, -  mild, due to sepsis  Severe protein calorie malnutrition -consult nutritionist, high risk for refeeding syndrome.  Multiple replacement for low phosphorus level  Hx DM. -Continue with insulin  sliding scale  Chronic pain syndrome -Altered mentation has resolved, resume home regimen  GOC -Palliative care input greatly appreciated, patient is currently DNR, please see above discussion regarding no escalation of care.  Palliative medicine to see again tomorrow.  Zinc  deficiency -Started on supplements  Pressure ulcers -Continue with wound care  Pressure Injury 10/29/23 Heel Right Deep Tissue Pressure Injury - Purple or maroon localized area of discolored intact skin or blood-filled blister due to damage of underlying soft tissue from pressure and/or shear. 3.5x2.5 purple (Active)  10/29/23 2000  Location: Heel  Location Orientation: Right  Staging: Deep Tissue Pressure Injury - Purple or maroon localized area of discolored intact skin or blood-filled blister due to damage of underlying soft tissue from pressure and/or shear.  Wound Description (Comments): 3.5x2.5 purple  Present on Admission: Yes     Pressure Injury 10/30/23 Sacrum Medial Unstageable - Full thickness tissue loss in which the base of the injury  is covered by slough (yellow, tan, gray, green or brown) and/or eschar (tan, brown or black) in the wound bed. 7 cm x 6 cm 100% black tan necr (Active)  10/30/23 1002  Location: Sacrum  Location Orientation: Medial  Staging: Unstageable - Full thickness tissue loss in which the base of the injury is covered by slough (yellow, tan, gray, green or brown) and/or eschar (tan, brown or black) in the wound bed.  Wound Description (Comments): 7 cm x 6 cm 100% black tan necrotic tissue  Present on Admission: Yes      DVT prophylaxis: Lovenox  Code Status: DNR Family Communication: none at bedside, I have discussed with HCPOA Wadie Sharps couple times over the phone today Disposition:   Status is: Inpatient    Consultants:  PCCM ID Palliative medicine   Subjective:  Rapid response called overnight given low blood pressure, patient is encephalopathic and confused, cannot provide reliable complaints, but he is intermittently waking up asking for pain medicine and go back to sleep  Objective: Vitals:   11/04/23 0655 11/04/23 0745 11/04/23 0800 11/04/23 0930  BP: (!) 90/59 (!) 84/64  (!) 86/54  Pulse:  97 100   Resp: 12     Temp:  98.2 F (36.8 C) 98.2 F (36.8 C)   TempSrc:  Axillary Axillary   SpO2:   98%   Weight:      Height:        Intake/Output Summary (Last 24 hours) at 11/04/2023 1326 Last data filed at 11/04/2023 0930 Gross Yang 24 hour  Intake 720 ml  Output 600 ml  Net 120 ml   Filed Weights   10/30/23 0500 10/31/23 0447 11/03/23 0500  Weight: 88.5 kg 90.3 kg 91 kg    Examination:   Left upper chest PICC line will be discontinued 1/12, site bandaged  [place] , wakes up, answering some simple questions, but overall confused, significantly frail, severely deconditioned, appears older than stated age Symmetrical Chest wall movement, air entry diminished at the bases RRR,No Gallops,Rubs or new Murmurs, No Parasternal Heave +ve B.Sounds, Abd Soft, No tenderness, No  rebound - guarding or rigidity. No Cyanosis, Clubbing or edema, left BKA, Foley catheter present, has skin bruising   Data Reviewed: I have personally reviewed following labs and imaging studies  CBC: Recent Labs  Lab 10/29/23 1457 10/29/23 1730 10/30/23 0403 10/31/23 0429 11/02/23 0241 11/03/23 0425 11/04/23 0446  WBC 33.6*   < > 36.6* 18.2* 23.7* 17.7* 18.8*  NEUTROABS 31.9*  --   --   --   --   --   --   HGB 10.5*   < > 10.4* 8.8* 9.5* 7.3* 7.6*  HCT 32.4*   < > 31.2* 26.8* 28.5* 21.2* 22.3*  MCV 91.0   < > 88.9 88.7 86.9 86.5 85.1  PLT 227   < > 325 190 300 211 209   < > = values in this interval not displayed.    Basic Metabolic Panel: Recent Labs  Lab 10/31/23 0429 11/01/23 0507 11/02/23 0241 11/03/23 0425 11/04/23 0446 11/04/23 0449  NA 128* 127* 130* 126*  --  131*  K 3.9 4.4 3.8 4.1  --  4.2  CL 96* 97* 96* 95*  --  99  CO2 23 25 24 23   --  23  GLUCOSE 243* 177* 95 114*  --  125*  BUN 49* 46* 42* 39*  --  40*  CREATININE 0.97 1.01 0.76 0.73  --  0.99  CALCIUM  7.8* 7.7* 8.1* 7.9*  --  8.0*  MG 1.9 2.4 2.5* 2.2 2.6*  --   PHOS 2.4* 1.6* 2.4* 2.1* 2.7  --     GFR: Estimated Creatinine Clearance: 80.6 mL/min (by C-G formula based on SCr of 0.99 mg/dL).  Liver Function Tests: Recent Labs  Lab 10/29/23 1457 10/29/23 1925  AST 18  --   ALT 10  --   ALKPHOS 99  --   BILITOT 1.3*  --   PROT 5.7* 5.4*  ALBUMIN  2.1*  --     CBG: Recent Labs  Lab 11/03/23 2032 11/04/23 0044 11/04/23 0431 11/04/23 0822 11/04/23 1218  GLUCAP 119* 156* 116* 124* 150*     Recent Results (from the past 240 hours)  Resp panel by RT-PCR (RSV, Flu A&B, Covid) Anterior Nasal Swab     Status: None   Collection Time: 10/29/23  2:09 PM   Specimen: Anterior Nasal Swab  Result Value Ref Range Status   SARS Coronavirus 2 by RT PCR NEGATIVE NEGATIVE Final   Influenza A by PCR NEGATIVE NEGATIVE Final   Influenza B by PCR NEGATIVE NEGATIVE Final    Comment: (NOTE) The  Xpert Xpress SARS-CoV-2/FLU/RSV plus assay is intended as an aid in the diagnosis of influenza from Nasopharyngeal swab specimens and should not be used as a sole basis for treatment. Nasal washings and aspirates are unacceptable for Xpert Xpress SARS-CoV-2/FLU/RSV testing.  Fact Sheet for Patients: bloggercourse.com  Fact Sheet for Healthcare Providers: seriousbroker.it  This test is not yet approved or cleared by the [place]  FDA and has been authorized for detection and/or  diagnosis of SARS-CoV-2 by FDA under an Emergency Use Authorization (EUA). This EUA will remain in effect (meaning this test can be used) for the duration of the COVID-19 declaration under Section 564(b)(1) of the Act, 21 U.S.C. section 360bbb-3(b)(1), unless the authorization is terminated or revoked.     Resp Syncytial Virus by PCR NEGATIVE NEGATIVE Final    Comment: (NOTE) Fact Sheet for Patients: bloggercourse.com  Fact Sheet for Healthcare Providers: seriousbroker.it  This test is not yet approved or cleared by the [place]  FDA and has been authorized for detection and/or diagnosis of SARS-CoV-2 by FDA under an Emergency Use Authorization (EUA). This EUA will remain in effect (meaning this test can be used) for the duration of the COVID-19 declaration under Section 564(b)(1) of the Act, 21 U.S.C. section 360bbb-3(b)(1), unless the authorization is terminated or revoked.  Performed at [facility], [address] [place], [place] [address]   Blood Culture (routine x 2)     Status: Abnormal   Collection Time: 10/29/23  2:14 PM   Specimen: BLOOD LEFT ARM  Result Value Ref Range Status   Specimen Description BLOOD LEFT ARM  Final   Special Requests   Final    BOTTLES DRAWN AEROBIC AND ANAEROBIC Blood Culture adequate volume   Culture  Setup Time   Final    GRAM NEGATIVE RODS IN BOTH  AEROBIC AND ANAEROBIC BOTTLES CRITICAL RESULT CALLED TO, READ BACK BY AND VERIFIED WITH: PHARMD ANDREW MEYER ON 10/30/23 @ 1608 BY DRT Performed at [facility], [address], [place], [place] [address]    Culture (A)  Final    KLEBSIELLA PNEUMONIAE Confirmed Extended Spectrum Beta-Lactamase Producer (ESBL).  In bloodstream infections from ESBL organisms, carbapenems are preferred over piperacillin /tazobactam. They are shown to have a lower risk of mortality.    Report Status 11/01/2023 FINAL  Final   Organism ID, Bacteria KLEBSIELLA PNEUMONIAE  Final      Susceptibility   Klebsiella pneumoniae - MIC*    AMPICILLIN >=32 RESISTANT Resistant     CEFEPIME  >=32 RESISTANT Resistant     CEFTAZIDIME  >=64 RESISTANT Resistant     CEFTRIAXONE  >=64 RESISTANT Resistant     CIPROFLOXACIN >=4 RESISTANT Resistant     GENTAMICIN >=16 RESISTANT Resistant     IMIPENEM 1 SENSITIVE Sensitive     TRIMETH /SULFA  >=320 RESISTANT Resistant     AMPICILLIN/SULBACTAM >=32 RESISTANT Resistant     PIP/TAZO 64 INTERMEDIATE Intermediate ug/mL    * KLEBSIELLA PNEUMONIAE  Blood Culture ID Panel (Reflexed)     Status: Abnormal   Collection Time: 10/29/23  2:14 PM  Result Value Ref Range Status   Enterococcus faecalis NOT DETECTED NOT DETECTED Final   Enterococcus Faecium NOT DETECTED NOT DETECTED Final   Listeria monocytogenes NOT DETECTED NOT DETECTED Final   Staphylococcus species NOT DETECTED NOT DETECTED Final   Staphylococcus aureus (BCID) NOT DETECTED NOT DETECTED Final   Staphylococcus epidermidis NOT DETECTED NOT DETECTED Final   Staphylococcus lugdunensis NOT DETECTED NOT DETECTED Final   Streptococcus species NOT DETECTED NOT DETECTED Final   Streptococcus agalactiae NOT DETECTED NOT DETECTED Final   Streptococcus pneumoniae NOT DETECTED NOT DETECTED Final   Streptococcus pyogenes NOT DETECTED NOT DETECTED Final   A.calcoaceticus-baumannii NOT DETECTED NOT DETECTED Final   Bacteroides fragilis NOT  DETECTED NOT DETECTED Final   Enterobacterales DETECTED (A) NOT DETECTED Final    Comment: Enterobacterales represent a large order of gram negative bacteria, not a single organism. CRITICAL RESULT  CALLED TO, READ BACK BY AND VERIFIED WITH: PHARMD ANDREW MEYER ON 10/30/23 @ 1608 BY DRT    Enterobacter cloacae complex NOT DETECTED NOT DETECTED Final   Escherichia coli NOT DETECTED NOT DETECTED Final   Klebsiella aerogenes NOT DETECTED NOT DETECTED Final   Klebsiella oxytoca NOT DETECTED NOT DETECTED Final   Klebsiella pneumoniae DETECTED (A) NOT DETECTED Final    Comment: CRITICAL RESULT CALLED TO, READ BACK BY AND VERIFIED WITH: PHARMD ANDREW MEYER ON 10/30/23 @ 1608 BY DRT    Proteus species NOT DETECTED NOT DETECTED Final   Salmonella species NOT DETECTED NOT DETECTED Final   Serratia marcescens NOT DETECTED NOT DETECTED Final   Haemophilus influenzae NOT DETECTED NOT DETECTED Final   Neisseria meningitidis NOT DETECTED NOT DETECTED Final   Pseudomonas aeruginosa NOT DETECTED NOT DETECTED Final   Stenotrophomonas maltophilia NOT DETECTED NOT DETECTED Final   Candida albicans NOT DETECTED NOT DETECTED Final   Candida auris NOT DETECTED NOT DETECTED Final   Candida glabrata NOT DETECTED NOT DETECTED Final   Candida krusei NOT DETECTED NOT DETECTED Final   Candida parapsilosis NOT DETECTED NOT DETECTED Final   Candida tropicalis NOT DETECTED NOT DETECTED Final   Cryptococcus neoformans/gattii NOT DETECTED NOT DETECTED Final   CTX-M ESBL DETECTED (A) NOT DETECTED Final    Comment: CRITICAL RESULT CALLED TO, READ BACK BY AND VERIFIED WITH: PHARMD ANDREW MEYER ON 10/30/23 @ 1608 BY DRT (NOTE) Extended spectrum beta-lactamase detected. Recommend a carbapenem as initial therapy.      Carbapenem resistance IMP NOT DETECTED NOT DETECTED Final   Carbapenem resistance KPC NOT DETECTED NOT DETECTED Final   Carbapenem resistance NDM NOT DETECTED NOT DETECTED Final   Carbapenem resist OXA 48  LIKE NOT DETECTED NOT DETECTED Final   Carbapenem resistance VIM NOT DETECTED NOT DETECTED Final    Comment: Performed at [facility], [address] [place], [place] [address]  Blood Culture (routine x 2)     Status: None   Collection Time: 10/29/23  2:57 PM   Specimen: BLOOD LEFT ARM  Result Value Ref Range Status   Specimen Description BLOOD LEFT ARM  Final   Special Requests   Final    BOTTLES DRAWN AEROBIC AND ANAEROBIC Blood Culture results may not be optimal due to an inadequate volume of blood received in culture bottles   Culture   Final    NO GROWTH 5 DAYS Performed at [facility], [address], [place], [place] [address]    Report Status 11/03/2023 FINAL  Final  Respiratory (~20 pathogens) panel by PCR     Status: None   Collection Time: 10/29/23  5:31 PM   Specimen: Pleural; Respiratory  Result Value Ref Range Status   Adenovirus NOT DETECTED NOT DETECTED Final   Coronavirus 229E NOT DETECTED NOT DETECTED Final    Comment: (NOTE) The Coronavirus on the Respiratory Panel, DOES NOT test for the novel  Coronavirus (2019 nCoV)    Coronavirus HKU1 NOT DETECTED NOT DETECTED Final   Coronavirus NL63 NOT DETECTED NOT DETECTED Final   Coronavirus OC43 NOT DETECTED NOT DETECTED Final   Metapneumovirus NOT DETECTED NOT DETECTED Final   Rhinovirus / Enterovirus NOT DETECTED NOT DETECTED Final   Influenza A NOT DETECTED NOT DETECTED Final   Influenza B NOT DETECTED NOT DETECTED Final   Parainfluenza Virus 1 NOT DETECTED NOT DETECTED Final   Parainfluenza Virus 2 NOT DETECTED NOT DETECTED Final   Parainfluenza Virus 3 NOT DETECTED  NOT DETECTED Final   Parainfluenza Virus 4 NOT DETECTED NOT DETECTED Final   Respiratory Syncytial Virus NOT DETECTED NOT DETECTED Final   Bordetella pertussis NOT DETECTED NOT DETECTED Final   Bordetella Parapertussis NOT DETECTED NOT DETECTED Final   Chlamydophila pneumoniae NOT DETECTED NOT DETECTED Final   Mycoplasma pneumoniae NOT  DETECTED NOT DETECTED Final    Comment: Performed at [facility], [address], [place], [place] [address]  Body fluid culture w Gram Stain     Status: None   Collection Time: 10/29/23  7:10 PM   Specimen: Pleural Fluid  Result Value Ref Range Status   Specimen Description PLEURAL  Final   Special Requests NONE  Final   Gram Stain   Final    WBC PRESENT,BOTH PMN AND MONONUCLEAR NO ORGANISMS SEEN CYTOSPIN SMEAR    Culture   Final    NO GROWTH 3 DAYS Performed at [facility], [address] [place], [place] [address]    Report Status 11/02/2023 FINAL  Final  MRSA Next Gen by PCR, Nasal     Status: Abnormal   Collection Time: 10/29/23  7:30 PM  Result Value Ref Range Status   MRSA by PCR Next Gen DETECTED (A) NOT DETECTED Final    Comment: RESULT CALLED TO, READ BACK BY AND VERIFIED WITH:  W. HUTCHES 10/29/2023 @ 2217 BY AB (NOTE) The GeneXpert MRSA Assay (FDA approved for NASAL specimens only), is one component of a comprehensive MRSA colonization surveillance program. It is not intended to diagnose MRSA infection nor to guide or monitor treatment for MRSA infections. Test performance is not FDA approved in patients less than 81 years old. Performed at [facility], [address], [place], [place] [address]          Radiology Studies: US  EKG SITE RITE Result Date: 11/04/2023 If Site Rite image not attached, placement could not be confirmed due to current cardiac rhythm.        Scheduled Meds:  ascorbic acid   500 mg Oral Daily   atorvastatin   40 mg Oral QHS   Chlorhexidine  Gluconate Cloth  6 each Topical Daily   collagenase    Topical Daily   feeding supplement  237 mL Oral TID BM   folic acid   1 mg Oral Daily   gabapentin   100 mg Oral QHS   hydrocortisone  sod succinate (SOLU-CORTEF ) inj  100 mg Intravenous Q8H   insulin  aspart  0-20 Units Subcutaneous Q4H   insulin  glargine-yfgn  5 Units Subcutaneous BID   liver oil-zinc  oxide   Topical  BID   midodrine   10 mg Oral TID with meals   multivitamin with minerals  1 tablet Oral Daily   mupirocin  ointment  1 Application Nasal BID   naloxone        pantoprazole  (PROTONIX ) IV  40 mg Intravenous Q12H   sodium chloride  flush  10-40 mL Intracatheter Q12H   thiamine   100 mg Oral Daily   zinc  sulfate (50mg  elemental zinc )  220 mg Oral Daily   Continuous Infusions:  meropenem  (MERREM ) IV 1 g (11/04/23 0917)     LOS: 6 days      Brayton Lye, MD [facility]   To contact the attending provider between 7A-7P or the covering provider during after hours 7P-7A, please log into the web site [email or web address] and access using universal [place] password for that web site. If you do not have the password, please call the hospital operator.  11/04/2023,  1:26 PM

## 2023-11-04 NOTE — Progress Notes (Signed)
 Patient is hypotensive, encephalopathic, blood pressure is low again despite IV hydrocortisone , IV fluid boluses, albumin , midodrine  and blood transfusion, he is DNR, I have discussed with his next of kin which he assigned to be his HCPOA his friend Dickey Gladis Sharps, patient has no alive family members, discussed with her plan of care and goals of care discussion, at this point we will continue with current measures including medical management but no invasive procedures including central lines, no pressors, no transfer to ICU, will reassess frequently and update her frequently, but if he decompensate, and blood pressure does not stabilize, then plan to proceed with full comfort measures.    Plan: -Continue with current management including IV fluids, blood transfusions as needed, albumin , tolerating, and stress dose IV hydrocortisone . -No central lines, no pressors, no transfer to ICU -Will follow closely, if decompensates, despite current management, then plan for full comfort measures  Brayton Lye MD

## 2023-11-04 NOTE — Progress Notes (Signed)
 OT Cancellation Note  Patient Details Name: Peter Yang MRN: [id number] DOB: 03/18/1956   Cancelled Treatment:    Reason Eval/Treat Not Completed: Medical issues which prohibited therapy (soft BP, MD asking therapy to hold today. will follow up next date as schedule permits)  Marlene Pfluger K, OTD, OTR/L [facility] [phone]   [name] 11/04/2023, 2:21 PM

## 2023-11-05 DIAGNOSIS — B961 Klebsiella pneumoniae [K. pneumoniae] as the cause of diseases classified elsewhere: Secondary | ICD-10-CM | POA: Diagnosis not present

## 2023-11-05 DIAGNOSIS — E43 Unspecified severe protein-calorie malnutrition: Secondary | ICD-10-CM | POA: Diagnosis not present

## 2023-11-05 DIAGNOSIS — Z789 Other specified health status: Secondary | ICD-10-CM | POA: Diagnosis not present

## 2023-11-05 DIAGNOSIS — Z66 Do not resuscitate: Secondary | ICD-10-CM | POA: Diagnosis not present

## 2023-11-05 DIAGNOSIS — R7881 Bacteremia: Secondary | ICD-10-CM | POA: Diagnosis not present

## 2023-11-05 DIAGNOSIS — J90 Pleural effusion, not elsewhere classified: Secondary | ICD-10-CM | POA: Diagnosis not present

## 2023-11-05 DIAGNOSIS — Z515 Encounter for palliative care: Secondary | ICD-10-CM | POA: Diagnosis not present

## 2023-11-05 LAB — TYPE AND SCREEN
ABO/RH(D): A POS
Antibody Screen: POSITIVE
Unit division: 0
Unit division: 0

## 2023-11-05 LAB — MAGNESIUM: Magnesium: 3 mg/dL — ABNORMAL HIGH (ref 1.7–2.4)

## 2023-11-05 LAB — BASIC METABOLIC PANEL
Anion gap: 9 (ref 5–15)
BUN: 37 mg/dL — ABNORMAL HIGH (ref 8–23)
CO2: 23 mmol/L (ref 22–32)
Calcium: 8.2 mg/dL — ABNORMAL LOW (ref 8.9–10.3)
Chloride: 101 mmol/L (ref 98–111)
Creatinine, Ser: 0.85 mg/dL (ref 0.61–1.24)
GFR, Estimated: >60 mL/min (ref >60)
Glucose, Bld: 128 mg/dL — ABNORMAL HIGH (ref 70–99)
Potassium: 4.6 mmol/L (ref 3.5–5.1)
Sodium: 133 mmol/L — ABNORMAL LOW (ref 135–145)

## 2023-11-05 LAB — GLUCOSE, CAPILLARY
Glucose-Capillary: 130 mg/dL — ABNORMAL HIGH (ref 70–99)
Glucose-Capillary: 198 mg/dL — ABNORMAL HIGH (ref 70–99)
Glucose-Capillary: 166 mg/dL — ABNORMAL HIGH (ref 70–99)

## 2023-11-05 LAB — BPAM RBC
Blood Product Expiration Date: 202501222359
ISSUE DATE / TIME: 202501131345
ISSUE DATE / TIME: 202501140747
ISSUE DATE / TIME: 202501242359
Unit Type and Rh: 202501222359
Unit Type and Rh: 202501242359
Unit Type and Rh: 5100
Unit Type and Rh: 5100

## 2023-11-05 LAB — CBC
HCT: 22.8 % — ABNORMAL LOW (ref 39.0–52.0)
Hemoglobin: 7.6 g/dL — ABNORMAL LOW (ref 13.0–17.0)
MCH: 28.8 pg (ref 26.0–34.0)
MCHC: 33.3 g/dL (ref 30.0–36.0)
MCV: 86.4 fL (ref 80.0–100.0)
Platelets: 149 10*3/uL — ABNORMAL LOW (ref 150–400)
RBC: 2.64 MIL/uL — ABNORMAL LOW (ref 4.22–5.81)
RDW: 19.9 % — ABNORMAL HIGH (ref 11.5–15.5)
WBC: 12.7 10*3/uL — ABNORMAL HIGH (ref 4.0–10.5)
nRBC: 0 % (ref 0.0–0.2)

## 2023-11-05 LAB — PHOSPHORUS: Phosphorus: 3 mg/dL (ref 2.5–4.6)

## 2023-11-05 MED ORDER — ONDANSETRON 4 MG PO TBDP: 4.0000 mg | ORAL_TABLET | Freq: Four times a day (QID) | ORAL | Status: DC | PRN

## 2023-11-05 MED ORDER — ACETAMINOPHEN 325 MG PO TABS: 650.0000 mg | ORAL_TABLET | Freq: Four times a day (QID) | ORAL | Status: DC | PRN

## 2023-11-05 MED ORDER — LORAZEPAM 2 MG/ML PO CONC: 1.0000 mg | ORAL | Status: DC | PRN

## 2023-11-05 MED ORDER — ONDANSETRON HCL 4 MG/2ML IJ SOLN: 4.0000 mg | Freq: Four times a day (QID) | INTRAMUSCULAR | Status: DC | PRN

## 2023-11-05 MED ORDER — ENSURE ENLIVE PO LIQD: 237.0000 mL | ORAL | Status: DC | PRN

## 2023-11-05 MED ORDER — ACETAMINOPHEN 650 MG RE SUPP: 650.0000 mg | Freq: Four times a day (QID) | RECTAL | Status: DC | PRN

## 2023-11-05 MED ORDER — HALOPERIDOL 1 MG PO TABS
2.0000 mg | ORAL_TABLET | Freq: Four times a day (QID) | ORAL | Status: DC | PRN
  Filled 2023-11-05: qty 2

## 2023-11-05 MED ORDER — POLYVINYL ALCOHOL 1.4 % OP SOLN
1.0000 [drp] | Freq: Four times a day (QID) | OPHTHALMIC | Status: DC | PRN
  Filled 2023-11-05: qty 15

## 2023-11-05 MED ORDER — BIOTENE DRY MOUTH MT LIQD
15.0000 mL | Freq: Two times a day (BID) | OROMUCOSAL | Status: DC
  Administered 2023-11-05 – 2023-11-08 (×5): 15 mL via TOPICAL

## 2023-11-05 MED ORDER — HALOPERIDOL LACTATE 5 MG/ML IJ SOLN
2.0000 mg | Freq: Four times a day (QID) | INTRAMUSCULAR | Status: DC | PRN
  Administered 2023-11-05: 2 mg via INTRAVENOUS
  Filled 2023-11-05: qty 1

## 2023-11-05 MED ORDER — DIPHENHYDRAMINE HCL 50 MG/ML IJ SOLN: 12.5000 mg | INTRAMUSCULAR | Status: DC | PRN

## 2023-11-05 MED ORDER — LORAZEPAM 1 MG PO TABS: 1.0000 mg | ORAL_TABLET | ORAL | Status: DC | PRN

## 2023-11-05 MED ORDER — GLYCOPYRROLATE 1 MG PO TABS
1.0000 mg | ORAL_TABLET | ORAL | Status: DC | PRN
Start: 2023-11-05 — End: 2023-11-09
  Filled 2023-11-05: qty 1

## 2023-11-05 MED ORDER — HALOPERIDOL LACTATE 2 MG/ML PO CONC
2.0000 mg | Freq: Four times a day (QID) | ORAL | Status: DC | PRN
  Filled 2023-11-05: qty 5

## 2023-11-05 MED ORDER — LORAZEPAM 2 MG/ML IJ SOLN
1.0000 mg | INTRAMUSCULAR | Status: DC | PRN
Start: 2023-11-05 — End: 2023-11-09

## 2023-11-05 MED ORDER — MORPHINE SULFATE (PF) 2 MG/ML IV SOLN: 2.0000 mg | INTRAVENOUS | Status: DC | PRN

## 2023-11-05 MED ORDER — SODIUM CHLORIDE 0.9% FLUSH: 10.0000 mL | INTRAVENOUS | Status: DC | PRN

## 2023-11-05 MED ORDER — GLYCOPYRROLATE 0.2 MG/ML IJ SOLN: 0.2000 mg | INTRAMUSCULAR | Status: DC | PRN

## 2023-11-05 MED ORDER — SODIUM CHLORIDE 0.9% FLUSH: 10.0000 mL | Freq: Two times a day (BID) | INTRAVENOUS | Status: DC

## 2023-11-05 NOTE — Progress Notes (Signed)

## 2023-11-05 NOTE — Progress Notes (Signed)
 PROGRESS NOTE        PATIENT DETAILS Name: Peter Yang Age: 68 y.o. Sex: male Date of Birth: Dec 19, 1955 Admit Date: 10/29/2023 Admitting Physician Joesph Mussel, DO UJW:[id number], No Pcp Per  Brief Summary: Patient is a 69 y.o.  male with history of left BKA, history of Fournier's gangrene, chronic HFrEF, adrenal insufficiency, PAF,, chronic indwelling Foley catheter, chronic pain syndrome-who was admitted to the ICU on 1/8 for septic shock in the setting of ESBL bacteremia.  Significant events: 1/8>> admit to ICU-septic shock-ESBL bacteremia 1/11>> transferred to [facility] 1/14>> encephalopathic/hypotensive-stress dose steroids/midodrine /IVF/IV albumin -Dr. Osborne Blazer discussed with POA-no further escalation in care.  Monitor for 1-2 days-if no improvement-plan for hospice.  Significant studies: 1/8>> CT head: No acute intracranial malady 1/8>> CT abdomen/pelvis: Mild bilateral hydronephrosis-moderate to large bilateral pleural effusions 1/9>> renal ultrasound: No hydronephrosis-improved from prior CT.  Significant microbiology data: 1/8>> COVID/influenza/RSV PCR: Negative 1/8 >>respiratory virus panel: Negative 1/8>> blood culture: ESBL Klebsiella pneumoniae 1/8>> pleural fluid: No growth  Procedures: 1/8>> thoracocentesis-transudative  Consults: None  Subjective: Chronically ill appearing-sleeping-easily awoke-answering simple questions appropriately.  BP remain in the 80s-90 systolic.  Objective: Vitals: Blood pressure (!) 84/65, pulse 92, temperature 98.1 F (36.7 C), temperature source Axillary, resp. rate 15, height 6' (1.829 m), weight 102.7 kg, SpO2 96%.   Exam: Gen Exam:Alert awake-not in any distress-chronically sick appearing. HEENT:atraumatic, normocephalic Chest: B/L clear to auscultation anteriorly CVS:S1S2 regular Abdomen:soft non tender, non distended Extremities:no edema-left BKA. Neurology: Non focal Skin: no  rash  Pertinent Labs/Radiology:    Latest Ref Rng & Units 11/05/2023    4:27 AM 11/04/2023    4:53 PM 11/04/2023    4:46 AM  CBC  WBC 4.0 - 10.5 K/uL 12.7  16.1  18.8   Hemoglobin 13.0 - 17.0 g/dL 7.6  8.2  7.6   Hematocrit 39.0 - 52.0 % 22.8  24.2  22.3   Platelets 150 - 400 K/uL 149  167  209     Lab Results  Component Value Date   NA 133 (L) 11/05/2023   K 4.6 11/05/2023   CL 101 11/05/2023   CO2 23 11/05/2023     Assessment/Plan: Septic shock secondary to ESBL Klebsiella pneumoniae bacteremia Initially required ICU admission/pressors-and had improved-however on 1/14-developed encephalopathy/hypotension. Currently blood pressure soft/on the lower side-will continue with meropenem -plan on a 10-day course-stop IV fluids but will continue with midodrine  and stress dose steroids. Will attempt to get in touch with family for further goals of care discussion.  Overall prognosis is very poor.  Acute metabolic encephalopathy Secondary to above-lethargic-sleepy Supportive care Continue to treat underlying sepsis  Normocytic anemia Likely due to acute illness-no indication of blood loss Has required 2 units of PRBC-last transfused on 1/14 Follow CBC  Pleural effusion Transudative Cultures negative  Chronic hypotension Blood pressure remains soft-renal function stable Stopping IVF Continue midodrine /stress dose steroids  AKI Resolved  PAF Telemetry monitoring Eliquis  on hold due to severity of anemia  Adrenal insufficiency On chronic steroids-currently on stress dose IV hydrocortisone .  Chronic HFrEF (EF 35% by echo on June 2024) Some lower extremity edema but lying flat Stopping IVF today Unable to use diuretics due to soft blood pressure  DM-2 SSI  Recent Labs    11/04/23 2335 11/05/23 0322 11/05/23 1000  GLUCAP 144* 130* 198*    Chronic pain syndrome Supportive care  History of Fournier's gangrene History of left BKA Long-term SNF  resident  Chronic indwelling Foley catheter  Palliative care DNR in place Prior MD had a long discussion with patient's POA-[name]-no ICU transfer-no pressors-no further escalation in care.  If patient does not improve-transition to comfort measures.  Overall prognosis is poor-if family elects to transition to comfort measures-I think this is reasonable.  He has no quality of life.  Nutrition Status: Nutrition Problem: Severe Malnutrition Etiology: chronic illness Signs/Symptoms: severe fat depletion, severe muscle depletion, energy intake < or equal to 75% for > or equal to 1 month Interventions: Ensure Enlive (each supplement provides 350kcal and 20 grams of protein), Liberalize Diet  Pressure Ulcer: Agree with assessment and plan as outlined below. Pressure Injury 10/29/23 Heel Right Deep Tissue Pressure Injury - Purple or maroon localized area of discolored intact skin or blood-filled blister due to damage of underlying soft tissue from pressure and/or shear. 3.5x2.5 purple (Active)  10/29/23 2000  Location: Heel  Location Orientation: Right  Staging: Deep Tissue Pressure Injury - Purple or maroon localized area of discolored intact skin or blood-filled blister due to damage of underlying soft tissue from pressure and/or shear.  Wound Description (Comments): 3.5x2.5 purple  Present on Admission: Yes  Dressing Type Foam - Lift dressing to assess site every shift 11/04/23 1916     Pressure Injury 10/30/23 Sacrum Medial Unstageable - Full thickness tissue loss in which the base of the injury is covered by slough (yellow, tan, gray, green or brown) and/or eschar (tan, brown or black) in the wound bed. 7 cm x 6 cm 100% black tan necr (Active)  10/30/23 1002  Location: Sacrum  Location Orientation: Medial  Staging: Unstageable - Full thickness tissue loss in which the base of the injury is covered by slough (yellow, tan, gray, green or brown) and/or eschar (tan, brown or black) in the  wound bed.  Wound Description (Comments): 7 cm x 6 cm 100% black tan necrotic tissue  Present on Admission: Yes  Dressing Type Santyl ;Foam - Lift dressing to assess site every shift;Gauze (Comment) 11/04/23 0630   Class 1 Obesity: Estimated body mass index is 30.71 kg/m as calculated from the following:   Height as of this encounter: 6' (1.829 m).   Weight as of this encounter: 102.7 kg.    The patient is critically ill with multiple organ system failure and requires high complexity decision making for assessment and support, frequent evaluation and titration of therapies, advanced monitoring, review of radiographic studies and interpretation of complex data.   Total Critical time spent equals 45 minutes  Code status:   Code Status: Do not attempt resuscitation (DNR) - Comfort care   DVT Prophylaxis: SCDs Start: 10/29/23 1639   Family Communication: Left voicemail for her friend/POA-[name] Smith-[phone] on 1/15.   Disposition Plan: Status is: Inpatient Remains inpatient appropriate because: Severity of illness   Planned Discharge Destination:Skilled nursing facility   Diet: Diet Order             Diet regular Room service appropriate? Yes with Assist; Fluid consistency: Thin  Diet effective now                     Antimicrobial agents: Anti-infectives (From admission, onward)    Start     Dose/Rate Route Frequency Ordered Stop   10/31/23 0600  meropenem  (MERREM ) 1 g in sodium chloride  0.9 % 100 mL IVPB        1 g 200  mL/hr over 30 Minutes Intravenous Every 8 hours 10/30/23 2252 11/07/23 0759   10/29/23 2300  metroNIDAZOLE  (FLAGYL ) IVPB 500 mg  Status:  Discontinued        500 mg 100 mL/hr over 60 Minutes Intravenous Every 8 hours 10/29/23 1737 10/30/23 0958   10/29/23 1745  ceftazidime -avibactam (AVYCAZ ) 1.25 g in dextrose  5 % 50 mL IVPB  Status:  Discontinued        1.25 g 25 mL/hr over 2 Hours Intravenous Every 8 hours 10/29/23 1737 10/30/23 2243    10/29/23 1645  doxycycline  (VIBRAMYCIN ) 100 mg in dextrose  5 % 250 mL IVPB  Status:  Discontinued        100 mg 125 mL/hr over 120 Minutes Intravenous Every 12 hours 10/29/23 1642 10/30/23 0957   10/29/23 1430  vancomycin  (VANCOREADY) IVPB 1750 mg/350 mL        1,750 mg 175 mL/hr over 120 Minutes Intravenous  Once 10/29/23 1423 10/29/23 1827   10/29/23 1415  ceFEPIme  (MAXIPIME ) 2 g in sodium chloride  0.9 % 100 mL IVPB        2 g 200 mL/hr over 30 Minutes Intravenous  Once 10/29/23 1409 10/29/23 1552   10/29/23 1415  metroNIDAZOLE  (FLAGYL ) IVPB 500 mg        500 mg 100 mL/hr over 60 Minutes Intravenous  Once 10/29/23 1409 10/29/23 1645   10/29/23 1415  vancomycin  (VANCOCIN ) IVPB 1000 mg/200 mL premix  Status:  Discontinued        1,000 mg 200 mL/hr over 60 Minutes Intravenous  Once 10/29/23 1409 10/29/23 1423        MEDICATIONS: Scheduled Meds:  ascorbic acid   500 mg Oral Daily   atorvastatin   40 mg Oral QHS   Chlorhexidine  Gluconate Cloth  6 each Topical Daily   collagenase    Topical Daily   feeding supplement  237 mL Oral TID BM   folic acid   1 mg Oral Daily   hydrocortisone  sod succinate (SOLU-CORTEF ) inj  100 mg Intravenous Q8H   insulin  aspart  0-20 Units Subcutaneous Q4H   insulin  glargine-yfgn  5 Units Subcutaneous BID   liver oil-zinc  oxide   Topical BID   midodrine   10 mg Oral TID with meals   multivitamin with minerals  1 tablet Oral Daily   pantoprazole  (PROTONIX ) IV  40 mg Intravenous Q12H   sodium chloride  flush  10-40 mL Intracatheter Q12H   sodium chloride  flush  10-40 mL Intracatheter Q12H   thiamine   100 mg Oral Daily   zinc  sulfate (50mg  elemental zinc )  220 mg Oral Daily   Continuous Infusions:  lactated ringers  75 mL/hr at 11/05/23 0522   meropenem  (MERREM ) IV 1 g (11/05/23 0930)   PRN Meds:.acetaminophen , dextrose , docusate sodium , hyoscyamine , naLOXone  (NARCAN )  injection, ondansetron  (ZOFRAN ) IV, mouth rinse, oxyCODONE , polyethylene glycol, sodium  chloride flush, sodium chloride  flush   I have personally reviewed following labs and imaging studies  LABORATORY DATA: CBC: Recent Labs  Lab 10/29/23 1457 10/29/23 1730 11/02/23 0241 11/03/23 0425 11/04/23 0446 11/04/23 1653 11/05/23 0427  WBC 33.6*   < > 23.7* 17.7* 18.8* 16.1* 12.7*  NEUTROABS 31.9*  --   --   --   --   --   --   HGB 10.5*   < > 9.5* 7.3* 7.6* 8.2* 7.6*  HCT 32.4*   < > 28.5* 21.2* 22.3* 24.2* 22.8*  MCV 91.0   < > 86.9 86.5 85.1 84.9 86.4  PLT 227   < > 300  211 209 167 149*   < > = values in this interval not displayed.    Basic Metabolic Panel: Recent Labs  Lab 11/01/23 0507 11/02/23 0241 11/03/23 0425 11/04/23 0446 11/04/23 0449 11/05/23 0427  NA 127* 130* 126*  --  131* 133*  K 4.4 3.8 4.1  --  4.2 4.6  CL 97* 96* 95*  --  99 101  CO2 25 24 23   --  23 23  GLUCOSE 177* 95 114*  --  125* 128*  BUN 46* 42* 39*  --  40* 37*  CREATININE 1.01 0.76 0.73  --  0.99 0.85  CALCIUM  7.7* 8.1* 7.9*  --  8.0* 8.2*  MG 2.4 2.5* 2.2 2.6*  --  3.0*  PHOS 1.6* 2.4* 2.1* 2.7  --  3.0    GFR: Estimated Creatinine Clearance: 105.9 mL/min (by C-G formula based on SCr of 0.85 mg/dL).  Liver Function Tests: Recent Labs  Lab 10/29/23 1457 10/29/23 1925  AST 18  --   ALT 10  --   ALKPHOS 99  --   BILITOT 1.3*  --   PROT 5.7* 5.4*  ALBUMIN  2.1*  --    Recent Labs  Lab 10/29/23 1457  LIPASE 19   No results for input(s): "AMMONIA" in the last 168 hours.  Coagulation Profile: Recent Labs  Lab 10/29/23 1457  INR 2.3*    Cardiac Enzymes: No results for input(s): "CKTOTAL", "CKMB", "CKMBINDEX", "TROPONINI" in the last 168 hours.  BNP (last 3 results) No results for input(s): "PROBNP" in the last 8760 hours.  Lipid Profile: No results for input(s): "CHOL", "HDL", "LDLCALC", "TRIG", "CHOLHDL", "LDLDIRECT" in the last 72 hours.  Thyroid Function Tests: No results for input(s): "TSH", "T4TOTAL", "FREET4", "T3FREE", "THYROIDAB" in the last 72  hours.  Anemia Panel: No results for input(s): "VITAMINB12", "FOLATE", "FERRITIN", "TIBC", "IRON", "RETICCTPCT" in the last 72 hours.  Urine analysis:    Component Value Date/Time   COLORURINE RED (A) 10/29/2023 1930   APPEARANCEUR TURBID (A) 10/29/2023 1930   LABSPEC  10/29/2023 1930    TEST NOT REPORTED DUE TO COLOR INTERFERENCE OF URINE PIGMENT   PHURINE  10/29/2023 1930    TEST NOT REPORTED DUE TO COLOR INTERFERENCE OF URINE PIGMENT   GLUCOSEU (A) 10/29/2023 1930    TEST NOT REPORTED DUE TO COLOR INTERFERENCE OF URINE PIGMENT   HGBUR (A) 10/29/2023 1930    TEST NOT REPORTED DUE TO COLOR INTERFERENCE OF URINE PIGMENT   BILIRUBINUR (A) 10/29/2023 1930    TEST NOT REPORTED DUE TO COLOR INTERFERENCE OF URINE PIGMENT   KETONESUR (A) 10/29/2023 1930    TEST NOT REPORTED DUE TO COLOR INTERFERENCE OF URINE PIGMENT   PROTEINUR (A) 10/29/2023 1930    TEST NOT REPORTED DUE TO COLOR INTERFERENCE OF URINE PIGMENT   NITRITE (A) 10/29/2023 1930    TEST NOT REPORTED DUE TO COLOR INTERFERENCE OF URINE PIGMENT   LEUKOCYTESUR (A) 10/29/2023 1930    TEST NOT REPORTED DUE TO COLOR INTERFERENCE OF URINE PIGMENT    Sepsis Labs: Lactic Acid, Venous    Component Value Date/Time   LATICACIDVEN 1.5 11/04/2023 0753    MICROBIOLOGY: Recent Results (from the past 240 hours)  Resp panel by RT-PCR (RSV, Flu A&B, Covid) Anterior Nasal Swab     Status: None   Collection Time: 10/29/23  2:09 PM   Specimen: Anterior Nasal Swab  Result Value Ref Range Status   SARS Coronavirus 2 by RT PCR NEGATIVE NEGATIVE Final  Influenza A by PCR NEGATIVE NEGATIVE Final   Influenza B by PCR NEGATIVE NEGATIVE Final    Comment: (NOTE) The Xpert Xpress SARS-CoV-2/FLU/RSV plus assay is intended as an aid in the diagnosis of influenza from Nasopharyngeal swab specimens and should not be used as a sole basis for treatment. Nasal washings and aspirates are unacceptable for Xpert Xpress  SARS-CoV-2/FLU/RSV testing.  Fact Sheet for Patients: BloggerCourse.com  Fact Sheet for Healthcare Providers: SeriousBroker.it  This test is not yet approved or cleared by the [place]  FDA and has been authorized for detection and/or diagnosis of SARS-CoV-2 by FDA under an Emergency Use Authorization (EUA). This EUA will remain in effect (meaning this test can be used) for the duration of the COVID-19 declaration under Section 564(b)(1) of the Act, 21 U.S.C. section 360bbb-3(b)(1), unless the authorization is terminated or revoked.     Resp Syncytial Virus by PCR NEGATIVE NEGATIVE Final    Comment: (NOTE) Fact Sheet for Patients: BloggerCourse.com  Fact Sheet for Healthcare Providers: SeriousBroker.it  This test is not yet approved or cleared by the [place]  FDA and has been authorized for detection and/or diagnosis of SARS-CoV-2 by FDA under an Emergency Use Authorization (EUA). This EUA will remain in effect (meaning this test can be used) for the duration of the COVID-19 declaration under Section 564(b)(1) of the Act, 21 U.S.C. section 360bbb-3(b)(1), unless the authorization is terminated or revoked.  Performed at [facility], [address], [place], [place] [address]   Blood Culture (routine x 2)     Status: Abnormal   Collection Time: 10/29/23  2:14 PM   Specimen: BLOOD LEFT ARM  Result Value Ref Range Status   Specimen Description BLOOD LEFT ARM  Final   Special Requests   Final    BOTTLES DRAWN AEROBIC AND ANAEROBIC Blood Culture adequate volume   Culture  Setup Time   Final    GRAM NEGATIVE RODS IN BOTH AEROBIC AND ANAEROBIC BOTTLES CRITICAL RESULT CALLED TO, READ BACK BY AND VERIFIED WITH: PHARMD ANDREW MEYER ON 10/30/23 @ 1608 BY DRT Performed at [facility], [address] [place] [address] [place], [place] [address]    Culture (A)  Final    KLEBSIELLA  PNEUMONIAE Confirmed Extended Spectrum Beta-Lactamase Producer (ESBL).  In bloodstream infections from ESBL organisms, carbapenems are preferred over piperacillin /tazobactam. They are shown to have a lower risk of mortality.    Report Status 11/01/2023 FINAL  Final   Organism ID, Bacteria KLEBSIELLA PNEUMONIAE  Final      Susceptibility   Klebsiella pneumoniae - MIC*    AMPICILLIN >=32 RESISTANT Resistant     CEFEPIME  >=32 RESISTANT Resistant     CEFTAZIDIME  >=64 RESISTANT Resistant     CEFTRIAXONE  >=64 RESISTANT Resistant     CIPROFLOXACIN >=4 RESISTANT Resistant     GENTAMICIN >=16 RESISTANT Resistant     IMIPENEM 1 SENSITIVE Sensitive     TRIMETH /SULFA  >=320 RESISTANT Resistant     AMPICILLIN/SULBACTAM >=32 RESISTANT Resistant     PIP/TAZO 64 INTERMEDIATE Intermediate ug/mL    * KLEBSIELLA PNEUMONIAE  Blood Culture ID Panel (Reflexed)     Status: Abnormal   Collection Time: 10/29/23  2:14 PM  Result Value Ref Range Status   Enterococcus faecalis NOT DETECTED NOT DETECTED Final   Enterococcus Faecium NOT DETECTED NOT DETECTED Final   Listeria monocytogenes NOT DETECTED NOT DETECTED Final   Staphylococcus species NOT DETECTED NOT DETECTED Final   Staphylococcus aureus (BCID) NOT DETECTED NOT DETECTED Final  Staphylococcus epidermidis NOT DETECTED NOT DETECTED Final   Staphylococcus lugdunensis NOT DETECTED NOT DETECTED Final   Streptococcus species NOT DETECTED NOT DETECTED Final   Streptococcus agalactiae NOT DETECTED NOT DETECTED Final   Streptococcus pneumoniae NOT DETECTED NOT DETECTED Final   Streptococcus pyogenes NOT DETECTED NOT DETECTED Final   A.calcoaceticus-baumannii NOT DETECTED NOT DETECTED Final   Bacteroides fragilis NOT DETECTED NOT DETECTED Final   Enterobacterales DETECTED (A) NOT DETECTED Final    Comment: Enterobacterales represent a large order of gram negative bacteria, not a single organism. CRITICAL RESULT CALLED TO, READ BACK BY AND VERIFIED  WITH: PHARMD ANDREW MEYER ON 10/30/23 @ 1608 BY DRT    Enterobacter cloacae complex NOT DETECTED NOT DETECTED Final   Escherichia coli NOT DETECTED NOT DETECTED Final   Klebsiella aerogenes NOT DETECTED NOT DETECTED Final   Klebsiella oxytoca NOT DETECTED NOT DETECTED Final   Klebsiella pneumoniae DETECTED (A) NOT DETECTED Final    Comment: CRITICAL RESULT CALLED TO, READ BACK BY AND VERIFIED WITH: PHARMD ANDREW MEYER ON 10/30/23 @ 1608 BY DRT    Proteus species NOT DETECTED NOT DETECTED Final   Salmonella species NOT DETECTED NOT DETECTED Final   Serratia marcescens NOT DETECTED NOT DETECTED Final   Haemophilus influenzae NOT DETECTED NOT DETECTED Final   Neisseria meningitidis NOT DETECTED NOT DETECTED Final   Pseudomonas aeruginosa NOT DETECTED NOT DETECTED Final   Stenotrophomonas maltophilia NOT DETECTED NOT DETECTED Final   Candida albicans NOT DETECTED NOT DETECTED Final   Candida auris NOT DETECTED NOT DETECTED Final   Candida glabrata NOT DETECTED NOT DETECTED Final   Candida krusei NOT DETECTED NOT DETECTED Final   Candida parapsilosis NOT DETECTED NOT DETECTED Final   Candida tropicalis NOT DETECTED NOT DETECTED Final   Cryptococcus neoformans/gattii NOT DETECTED NOT DETECTED Final   CTX-M ESBL DETECTED (A) NOT DETECTED Final    Comment: CRITICAL RESULT CALLED TO, READ BACK BY AND VERIFIED WITH: PHARMD ANDREW MEYER ON 10/30/23 @ 1608 BY DRT (NOTE) Extended spectrum beta-lactamase detected. Recommend a carbapenem as initial therapy.      Carbapenem resistance IMP NOT DETECTED NOT DETECTED Final   Carbapenem resistance KPC NOT DETECTED NOT DETECTED Final   Carbapenem resistance NDM NOT DETECTED NOT DETECTED Final   Carbapenem resist OXA 48 LIKE NOT DETECTED NOT DETECTED Final   Carbapenem resistance VIM NOT DETECTED NOT DETECTED Final    Comment: Performed at [facility], [address] [place], [place] [address]  Blood Culture (routine x 2)     Status: None    Collection Time: 10/29/23  2:57 PM   Specimen: BLOOD LEFT ARM  Result Value Ref Range Status   Specimen Description BLOOD LEFT ARM  Final   Special Requests   Final    BOTTLES DRAWN AEROBIC AND ANAEROBIC Blood Culture results may not be optimal due to an inadequate volume of blood received in culture bottles   Culture   Final    NO GROWTH 5 DAYS Performed at [facility], [address] [place], [place] [address]    Report Status 11/03/2023 FINAL  Final  Respiratory (~20 pathogens) panel by PCR     Status: None   Collection Time: 10/29/23  5:31 PM   Specimen: Pleural; Respiratory  Result Value Ref Range Status   Adenovirus NOT DETECTED NOT DETECTED Final   Coronavirus 229E NOT DETECTED NOT DETECTED Final    Comment: (NOTE) The Coronavirus on the Respiratory Panel, DOES NOT test for  the novel  Coronavirus (2019 nCoV)    Coronavirus HKU1 NOT DETECTED NOT DETECTED Final   Coronavirus NL63 NOT DETECTED NOT DETECTED Final   Coronavirus OC43 NOT DETECTED NOT DETECTED Final   Metapneumovirus NOT DETECTED NOT DETECTED Final   Rhinovirus / Enterovirus NOT DETECTED NOT DETECTED Final   Influenza A NOT DETECTED NOT DETECTED Final   Influenza B NOT DETECTED NOT DETECTED Final   Parainfluenza Virus 1 NOT DETECTED NOT DETECTED Final   Parainfluenza Virus 2 NOT DETECTED NOT DETECTED Final   Parainfluenza Virus 3 NOT DETECTED NOT DETECTED Final   Parainfluenza Virus 4 NOT DETECTED NOT DETECTED Final   Respiratory Syncytial Virus NOT DETECTED NOT DETECTED Final   Bordetella pertussis NOT DETECTED NOT DETECTED Final   Bordetella Parapertussis NOT DETECTED NOT DETECTED Final   Chlamydophila pneumoniae NOT DETECTED NOT DETECTED Final   Mycoplasma pneumoniae NOT DETECTED NOT DETECTED Final    Comment: Performed at [facility], [address], [place], [place] [address]  Body fluid culture w Gram Stain     Status: None   Collection Time: 10/29/23  7:10 PM   Specimen: Pleural Fluid   Result Value Ref Range Status   Specimen Description PLEURAL  Final   Special Requests NONE  Final   Gram Stain   Final    WBC PRESENT,BOTH PMN AND MONONUCLEAR NO ORGANISMS SEEN CYTOSPIN SMEAR    Culture   Final    NO GROWTH 3 DAYS Performed at [facility], [address], [place], [place] [address]    Report Status 11/02/2023 FINAL  Final  MRSA Next Gen by PCR, Nasal     Status: Abnormal   Collection Time: 10/29/23  7:30 PM  Result Value Ref Range Status   MRSA by PCR Next Gen DETECTED (A) NOT DETECTED Final    Comment: RESULT CALLED TO, READ BACK BY AND VERIFIED WITH:  W. HUTCHES 10/29/2023 @ 2217 BY AB (NOTE) The GeneXpert MRSA Assay (FDA approved for NASAL specimens only), is one component of a comprehensive MRSA colonization surveillance program. It is not intended to diagnose MRSA infection nor to guide or monitor treatment for MRSA infections. Test performance is not FDA approved in patients less than 59 years old. Performed at [facility], [address], [place], [place] [address]     RADIOLOGY STUDIES/RESULTS: US  EKG SITE RITE Result Date: 11/04/2023 If Site Rite image not attached, placement could not be confirmed due to current cardiac rhythm.    LOS: 7 days   Kimberly Penna, MD  [facility]    To contact the attending provider between 7A-7P or the covering provider during after hours 7P-7A, please log into the web site [email or web address] and access using universal  password for that web site. If you do not have the password, please call the hospital operator.  11/05/2023, 12:03 PM

## 2023-11-05 NOTE — Progress Notes (Signed)
 [facility] Liaison Note   Received request from [facility] for family interest in [facility]. Contacted [facility] and discussed hospice philosophy and inpatient hospice care. Patient is approved for [facility] inpatient hospice care by Dr. Tessie Fila, hospice provider.   Unfortunately, [facility] is not able to offer a bed today. TOC manager aware.   Hospital liaison will follow up tomorrow or sooner if a room becomes available.   Please do not hesitate to call with questions.   Madelene Schanz, BSN, RN, [facility] Liaison [phone]

## 2023-11-05 NOTE — TOC Progression Note (Signed)
 Transition of Care [facility]) - Progression Note    Patient Details  Name: Peter Yang MRN: [id number] Date of Birth: January 10, 1956  Transition of Care [facility]) CM/SW Contact  Inocente GORMAN Kindle, LCSW Phone Number: 11/05/2023, 10:29 AM  Clinical Narrative:    CSW continuing to follow.    Expected Discharge Plan: Skilled Nursing Facility Barriers to Discharge: Continued Medical Work up  Expected Discharge Plan and Services   Discharge Planning Services: CM Consult Post Acute Care Choice: Skilled Nursing Facility                                         Social Determinants of Health (SDOH) Interventions SDOH Screenings   Food Insecurity: No Food Insecurity (10/30/2023)  Housing: Low Risk  (10/30/2023)  Transportation Needs: No Transportation Needs (10/30/2023)  Utilities: Not At Risk (10/30/2023)  Depression (PHQ2-9): Low Risk  (09/01/2023)  Financial Resource Strain: Low Risk  (08/13/2018)   Received from [facility] visits prior to 12/21/2022., [facility] visits prior to 12/21/2022.  Social Connections: Socially Isolated (10/30/2023)  Tobacco Use: Low Risk  (10/30/2023)    Readmission Risk Interventions    05/07/2023    4:42 PM 10/24/2022   11:21 AM 09/02/2022   10:36 AM  Readmission Risk Prevention Plan  Transportation Screening Complete Complete Complete  Medication Review [facility])  Complete Complete  PCP or Specialist appointment within 3-5 days of discharge Complete Complete Complete  HRI or Home Care Consult Complete Complete Complete  SW Recovery Care/Counseling Consult Complete Complete Complete  Palliative Care Screening Not Applicable Not Applicable Not Applicable  Skilled Nursing Facility Complete Complete Complete

## 2023-11-05 NOTE — TOC Progression Note (Signed)
 Transition of Care [facility]) - Progression Note    Patient Details  Name: Peter Yang MRN: [id number] Date of Birth: January 30, 1956  Transition of Care [facility]) CM/SW Contact  Jada Fass Gwyneth Lesch, Student-Social Work Phone Number: 11/05/2023, 3:40 PM  Clinical Narrative:    Received consult for hospice facility placement. MSW Intern spoke with Ninette Basque and presented choice, Ninette Basque chose [facility].    Expected Discharge Plan: Skilled Nursing Facility Barriers to Discharge: Continued Medical Work up  Expected Discharge Plan and Services   Discharge Planning Services: CM Consult Post Acute Care Choice: Skilled Nursing Facility                                         Social Determinants of Health (SDOH) Interventions SDOH Screenings   Food Insecurity: No Food Insecurity (10/30/2023)  Housing: Low Risk  (10/30/2023)  Transportation Needs: No Transportation Needs (10/30/2023)  Utilities: Not At Risk (10/30/2023)  Depression (PHQ2-9): Low Risk  (09/01/2023)  Financial Resource Strain: Low Risk  (08/13/2018)   Received from [facility] visits prior to 12/21/2022., [facility] visits prior to 12/21/2022.  Social Connections: Socially Isolated (10/30/2023)  Tobacco Use: Low Risk  (10/30/2023)    Readmission Risk Interventions    05/07/2023    4:42 PM 10/24/2022   11:21 AM 09/02/2022   10:36 AM  Readmission Risk Prevention Plan  Transportation Screening Complete Complete Complete  Medication Review Oceanographer)  Complete Complete  PCP or Specialist appointment within 3-5 days of discharge Complete Complete Complete  HRI or Home Care Consult Complete Complete Complete  SW Recovery Care/Counseling Consult Complete Complete Complete  Palliative Care Screening Not Applicable Not Applicable Not Applicable  Skilled Nursing Facility Complete Complete Complete

## 2023-11-05 NOTE — Progress Notes (Signed)
 OT Cancellation Note  Patient Details Name: Peter Yang MRN: [id number] DOB: [birth year]   Cancelled Treatment:    Reason Eval/Treat Not Completed: Other (comment) (Pt transitioned to comfort care, will discontinue therapy at this time. reconsult OT if needed)  11/05/2023  AB, OTR/L  Acute Rehabilitation Services  Office: [phone]   Jorene New 11/05/2023, 2:21 PM

## 2023-11-05 NOTE — Progress Notes (Addendum)
 Daily Progress Note   Patient Name: Peter Yang       Date: 11/05/2023 DOB: 1956-01-17  Age: 68 y.o. MRN#: [id number] Attending Physician: Burton Casey, MD Primary Care Physician: Patient, No Pcp Per Admit Date: 10/29/2023  Reason for Consultation/Follow-up: Establishing goals of care  Subjective: I have reviewed medical records including EPIC notes, MAR, any available advanced directives as necessary, and labs. Received report from primary RN - no acute concerns.  Went to visit patient at bedside - no family/visitors present. Patient is lying in bed lethargic.  He does respond to moderate touch and voice; however, leaves eyes closes through entire interaction and has a hard time maintaining wakeful state. He is oriented to self and place but not situation. He is ill and frail appearing.  Received updates from Dr. Hilton Lucky.   11:09 AM Attempted to call friend/[name] to offer support and ongoing GOC - no answer - confidential voicemail left and PMT phone number provided with request to return call.  12:00 PM Received notification Ninette Basque returned call to PMT phone.  12:35 PM Called Ninette Basque - emotional support provided. Therapeutic listening provided as she reflects on patient's interval history since admission. She has a clear understanding of his current acute situation. Unfortunately, patient has not shown improvement and overall has declined since admission.   We talked about transition to comfort measures in house and what that would entail inclusive of medications to control pain, dyspnea, agitation, nausea, and itching. We discussed stopping all unnecessary measures such as blood draws, needle sticks, oxygen, antibiotics, CBGs/insulin , cardiac monitoring, IVF, and frequent vital  signs. Education provided that other non-pharmacological interventions would be utilized for holistic support and comfort such as spiritual support if requested, repositioning, music therapy, offering comfort feeds, and/or therapeutic listening. All care would focus on how the patient is looking and feeling.   Provided education and counseling at length on the philosophy and benefits of hospice care. Discussed that it offers a holistic approach to care in the setting of end-stage illness, and is about supporting the patient where they are allowing nature to take it's course. Discussed the hospice team includes RNs, physicians, social workers, and chaplains. They can provide personal care, support for the family, and help keep patient out of the hospital.  Ninette Basque is understandably tearful. She states, "whatever makes  him comfortable." She does not want him to suffer. She was able to update patient's nephew/Michael Nolia Baumgartner on his current situation - per nephew, he and patient have not been in contact in 4 years. Ninette Basque does not have any contact information for nephew. Reviewed that patient clearly wishes for Better to be surrogate decision maker if/when needed - she is willing to make decisions for him today. She opts for his transition to full comfort measures today and is interested in his transfer to hospice facility, though does not have a facility preference since she lives in [place]. She is ok with any hospice around the hospital.  Therapeutic listening provided as she reflects on discussions she's had with the patient regarding his goals/wishes - she feels this is what he would want. She encouraged him to complete ADs and a Will but he never did.  All questions and concerns addressed. Encouraged to call with questions and/or concerns. PMT card provided.  **ADDENDUM**  4:00 PM Received notification patient's nephew/Michael Nolia Baumgartner [phone] called PMT phone and left a message. Friend/[name] found his  contact information and told him that hospital staff were trying to reach him regarding decisions. Bambi Lever notes in voicemail that he has not "spoken nor seen him [patient] in decades."  4:35 PM Attempted to call nephew/Michael to discuss situation - no answer - confidential voicemail left. Notified that friend/[name] was patient's preferred surrogate decision maker and that decisions regarding his care had been made this morning. Encouraged him to return call if he wished discuss further, obtain more information, or have any questions and I would be happy to discuss.     Length of Stay: 7  Current Medications: Scheduled Meds:   ascorbic acid   500 mg Oral Daily   atorvastatin   40 mg Oral QHS   Chlorhexidine  Gluconate Cloth  6 each Topical Daily   collagenase    Topical Daily   feeding supplement  237 mL Oral TID BM   folic acid   1 mg Oral Daily   hydrocortisone  sod succinate (SOLU-CORTEF ) inj  100 mg Intravenous Q8H   insulin  aspart  0-20 Units Subcutaneous Q4H   insulin  glargine-yfgn  5 Units Subcutaneous BID   liver oil-zinc  oxide   Topical BID   midodrine   10 mg Oral TID with meals   multivitamin with minerals  1 tablet Oral Daily   pantoprazole  (PROTONIX ) IV  40 mg Intravenous Q12H   sodium chloride  flush  10-40 mL Intracatheter Q12H   thiamine   100 mg Oral Daily   zinc  sulfate (50mg  elemental zinc )  220 mg Oral Daily    Continuous Infusions:  lactated ringers  75 mL/hr at 11/05/23 0522   meropenem  (MERREM ) IV 1 g (11/05/23 0930)    PRN Meds: acetaminophen , dextrose , docusate sodium , hyoscyamine , naLOXone  (NARCAN )  injection, ondansetron  (ZOFRAN ) IV, mouth rinse, oxyCODONE , polyethylene glycol, sodium chloride  flush  Physical Exam Vitals and nursing note reviewed.  Constitutional:      General: He is not in acute distress.    Appearance: He is ill-appearing.  Pulmonary:     Effort: No respiratory distress.  Skin:    General: Skin is warm and dry.  Neurological:      Mental Status: He is lethargic and disoriented.     Motor: Weakness present.  Psychiatric:        Speech: Speech is delayed.        Behavior: Behavior is slowed.        Cognition and Memory: Cognition is impaired. Memory is impaired.  Vital Signs: BP (!) 84/65 (BP Location: Right Arm)   Pulse 92   Temp 98.1 F (36.7 C) (Axillary)   Resp 15   Ht 6' (1.829 m)   Wt 102.7 kg   SpO2 96%   BMI 30.71 kg/m  SpO2: SpO2: 96 % O2 Device: O2 Device: Room Air O2 Flow Rate:    Intake/output summary:  Intake/Output Summary (Last 24 hours) at 11/05/2023 1028 Last data filed at 11/05/2023 0007 Gross per 24 hour  Intake --  Output 800 ml  Net -800 ml   LBM: Last BM Date : 11/05/23 Baseline Weight: Weight: 79.4 kg Most recent weight: Weight: 102.7 kg       Palliative Assessment/Data: PPS 10-20%      Patient Active Problem List   Diagnosis Date Noted   ESBL (extended spectrum beta-lactamase) producing bacteria infection 10/31/2023   Bacteremia due to Klebsiella pneumoniae 10/30/2023   Protein-calorie malnutrition, severe 10/30/2023   Shock (HCC) 10/29/2023   History of MDR Pseudomonas aeruginosa infection 06/07/2023   Malnutrition of moderate degree 06/06/2023   Wound dehiscence in puerperium, perineal 06/04/2023   AMS (altered mental status) 05/07/2023   Seizure-like activity (HCC) 05/06/2023   Altered mental status 05/06/2023   Fournier's gangrene in male 04/02/2023   Soft tissue infection 04/02/2023   Necrotizing soft tissue infection 04/02/2023   UTI (urinary tract infection) due to urinary indwelling catheter (HCC) 04/01/2023   S/P BKA (below knee amputation) unilateral, left (HCC) 04/01/2023   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) 04/01/2023   Long term current use of systemic steroids 04/01/2023   Thrombocytopenia (HCC) 10/01/2022   Orthostatic hypotension 09/24/2022   HFrEF (heart failure with reduced ejection fraction) (HCC)  09/23/2022   Counseling and coordination of care 09/15/2022   Pain 09/14/2022   High risk medication use 09/14/2022   Need for emotional support 09/12/2022   Mood altered 09/11/2022   Medication management 09/11/2022   Palliative care by specialist 09/08/2022   Goals of care, counseling/discussion 09/08/2022   Physical deconditioning 09/05/2022   Gross hematuria 08/31/2022   Type 2 diabetes mellitus with diabetic peripheral angiopathy without gangrene, without long-term current use of insulin  (HCC)    Impaired functional mobility, balance, gait, and endurance 08/03/2022   Obstructive sleep apnea syndrome 08/03/2022   Pleural effusion 08/03/2022   Catheter-associated urinary tract infection (HCC) 08/03/2022   History of urinary retention 08/03/2022   Mitral valve regurgitation 08/03/2022   Thoracic ascending aortic aneurysm (HCC) 07/23/2022   Borderline low blood pressure determined by examination 07/04/2022   Coronary artery disease due to lipid rich plaque 06/25/2022   Hematuria 06/25/2022   History of non-ST elevation myocardial infarction (NSTEMI) 06/15/2022   PAF (paroxysmal atrial fibrillation) (HCC) 06/15/2022   Dyslipidemia 06/15/2022   PVD (peripheral vascular disease) (HCC) 06/15/2022   Pressure ulcer of sacral region, stage 2 (HCC) 06/15/2022   Chronic HFrEF (heart failure with reduced ejection fraction) (HCC) 06/15/2022   DNR (do not resuscitate) 06/15/2022   Ischemic cardiomyopathy 02/12/2022   Need for assistance with personal care 08/03/2021   Diabetic polyneuropathy associated with type 2 diabetes mellitus (HCC) 07/28/2021   Prolonged QT interval 07/28/2021   Moderate protein-calorie malnutrition (HCC) 06/29/2021   Chronic insomnia 03/31/2021   BPH with obstruction/lower urinary tract symptoms 02/02/2018   Hyperlipidemia 04/15/2016   Vitamin D  deficiency 04/09/2016   Essential hypertension 01/25/2016    Palliative Care Assessment & Plan   Patient Profile: 68  y.o. male  with past medical history of Fournier's gangrene, DM, chronic hypotension, ureterocutaneous fistula, and history of debility requiring SNF/ rehab since BKA for necrotizing fasciitis admitted on 10/29/2023 with septic shock - suspected urinary source. Patient with hypotension requiring vasopressors. PMT consulted to discuss GOC.   Assessment: Principal Problem:   Bacteremia due to Klebsiella pneumoniae Active Problems:   Pleural effusion   Shock (HCC)   Protein-calorie malnutrition, severe   ESBL (extended spectrum beta-lactamase) producing bacteria infection   Terminal care  Recommendations/Plan: Initiated full comfort measures Continue DNR/DNI as previously documented - durable DNR form found in shadow chart. Copy was made and will be scanned into Vynca/ACP tab Transfer to residential hospice facility - [facility] notified and consult placed Continue palliative wound care Added orders for EOL symptom management and to reflect full comfort measures, as well as discontinued orders that were not focused on comfort Unrestricted visitation orders were placed per current [facility] E[facility] visitation policy  Nursing to provide frequent assessments and administer PRN medications as clinically necessary to ensure EOL comfort PMT will continue to follow and support holistically  Symptom Management Morphine  PRN severe pain/dyspnea/increased work of breathing/RR>25 Oxycodone  PRN moderate pain Tylenol  PRN mild pain/fever Biotin twice daily Benadryl  PRN itching Robinul  PRN secretions Haldol  PRN agitation/delirium Ativan  PRN anxiety/seizure/sleep/distress Zofran  PRN nausea/vomiting Liquifilm Tears PRN dry eye   Goals of Care and Additional Recommendations: Limitations on Scope of Treatment: Full Comfort Care  Code Status:    Code Status Orders  (From admission, onward)           Start     Ordered   11/04/23 0927  Do not attempt resuscitation (DNR) - Comfort care  (Code Status)   Continuous       Question Answer Comment  If patient has no pulse and is not breathing Do Not Attempt Resuscitation   In Pre-Arrest Conditions (Patient Is Breathing and Has a Pulse) Provide comfort measures. Relieve any mechanical airway obstruction. Avoid transfer unless required for comfort.   Consent: Discussion documented in EHR or advanced directives reviewed      11/04/23 0926           Code Status History     Date Active Date Inactive Code Status Order ID Comments User Context   10/29/2023 1640 11/04/2023 0926 Limited: Do not attempt resuscitation (DNR) -DNR-LIMITED -Do Not Intubate/DNI  [id number]  Joesph Mussel, DO ED   10/29/2023 1546 10/29/2023 1640 Limited: Do not attempt resuscitation (DNR) -DNR-LIMITED -Do Not Intubate/DNI  [id number]  Ninetta Basket, MD ED   06/04/2023 1201 06/19/2023 1857 Full Code [id number]  Sabas Cradle, MD Inpatient   05/06/2023 1013 05/13/2023 2221 DNR [id number]  Eveline Hipps, DO ED   04/01/2023 2237 04/18/2023 1911 DNR [id number]  Unk Garb, DO ED   08/31/2022 1946 10/24/2022 1827 DNR [id number]  Dea Evert, DO ED   08/03/2022 0859 08/17/2022 0705 DNR [id number]  Joelle Musca, MD ED   08/02/2022 1829 08/03/2022 0859 Full Code [id number]  Genora Kidd, MD ED   06/25/2022 1559 06/30/2022 2102 DNR [id number]  Raymona Caldwell, MD ED   06/17/2022 1246 06/19/2022 0420 DNR [id number]  Sammy Crisp, MD Inpatient   06/17/2022 1054 06/17/2022 1246 Full Code [id number]  Sammy Crisp, MD Inpatient   06/15/2022 1211 06/17/2022 1054 DNR [id number]  Lorita Rosa, MD ED   06/15/2022 0604 06/15/2022 1211 Full Code [id number]  Roxana Copier, DO ED   07/28/2021 0128 07/31/2021 0211 Full Code [id number]  Shalhoub, Merrill Abide,  MD ED       Prognosis:  < 2 weeks  Discharge Planning: Hospice facility  Care plan was discussed with primary RN, patient's friend, Dr. Hilton Lucky, [facility]  Thank you for allowing the Palliative Medicine Team to assist in the care of this  patient.     Annette Killings, NP  Please contact Palliative Medicine Team phone at [phone] for questions and concerns.   *Portions of this note are a verbal dictation therefore any spelling and/or grammatical errors are due to the "Dragon Medical One" system interpretation.

## 2023-11-06 DIAGNOSIS — Z789 Other specified health status: Secondary | ICD-10-CM | POA: Diagnosis not present

## 2023-11-06 DIAGNOSIS — Z66 Do not resuscitate: Secondary | ICD-10-CM | POA: Diagnosis not present

## 2023-11-06 DIAGNOSIS — Z515 Encounter for palliative care: Secondary | ICD-10-CM | POA: Diagnosis not present

## 2023-11-06 DIAGNOSIS — R7881 Bacteremia: Secondary | ICD-10-CM | POA: Diagnosis not present

## 2023-11-06 DIAGNOSIS — B961 Klebsiella pneumoniae [K. pneumoniae] as the cause of diseases classified elsewhere: Secondary | ICD-10-CM | POA: Diagnosis not present

## 2023-11-06 LAB — GLUCOSE, CAPILLARY: Glucose-Capillary: 131 mg/dL — ABNORMAL HIGH (ref 70–99)

## 2023-11-06 MED ORDER — LORAZEPAM 1 MG PO TABS: 1.0000 mg | ORAL_TABLET | ORAL | Status: AC | PRN

## 2023-11-06 MED ORDER — OXYCODONE HCL 5 MG PO TABS: 5.0000 mg | ORAL_TABLET | Freq: Four times a day (QID) | ORAL | Status: AC | PRN

## 2023-11-06 MED ORDER — HALOPERIDOL 2 MG PO TABS: 2.0000 mg | ORAL_TABLET | Freq: Four times a day (QID) | ORAL | Status: AC | PRN

## 2023-11-06 MED ORDER — GLYCOPYRROLATE 1 MG PO TABS: 1.0000 mg | ORAL_TABLET | ORAL | Status: AC | PRN

## 2023-11-06 MED ORDER — MORPHINE SULFATE (PF) 2 MG/ML IV SOLN: 2.0000 mg | INTRAVENOUS | Status: AC | PRN

## 2023-11-06 NOTE — Plan of Care (Signed)
  Problem: Education: Goal: Ability to describe self-care measures that may prevent or decrease complications (Diabetes Survival Skills Education) will improve Outcome: Progressing Goal: Individualized Educational Video(s) Outcome: Progressing   Problem: Coping: Goal: Ability to adjust to condition or change in health will improve Outcome: Progressing   Problem: Fluid Volume: Goal: Ability to maintain a balanced intake and output will improve Outcome: Progressing   Problem: Health Behavior/Discharge Planning: Goal: Ability to identify and utilize available resources and services will improve Outcome: Progressing Goal: Ability to manage health-related needs will improve Outcome: Progressing   Problem: Metabolic: Goal: Ability to maintain appropriate glucose levels will improve Outcome: Progressing   Problem: Nutritional: Goal: Maintenance of adequate nutrition will improve Outcome: Progressing Goal: Progress toward achieving an optimal weight will improve Outcome: Progressing   Problem: Skin Integrity: Goal: Risk for impaired skin integrity will decrease Outcome: Progressing   Problem: Tissue Perfusion: Goal: Adequacy of tissue perfusion will improve Outcome: Progressing   Problem: Education: Goal: Knowledge of General Education information will improve Description: Including pain rating scale, medication(s)/side effects and non-pharmacologic comfort measures Outcome: Progressing   Problem: Health Behavior/Discharge Planning: Goal: Ability to manage health-related needs will improve Outcome: Progressing   Problem: Clinical Measurements: Goal: Ability to maintain clinical measurements within normal limits will improve Outcome: Progressing Goal: Will remain free from infection Outcome: Progressing Goal: Diagnostic test results will improve Outcome: Progressing Goal: Respiratory complications will improve Outcome: Progressing Goal: Cardiovascular complication will  be avoided Outcome: Progressing   Problem: Activity: Goal: Risk for activity intolerance will decrease Outcome: Progressing   Problem: Nutrition: Goal: Adequate nutrition will be maintained Outcome: Progressing   Problem: Coping: Goal: Level of anxiety will decrease Outcome: Progressing   Problem: Elimination: Goal: Will not experience complications related to bowel motility Outcome: Progressing Goal: Will not experience complications related to urinary retention Outcome: Progressing   Problem: Pain Management: Goal: General experience of comfort will improve Outcome: Progressing   Problem: Safety: Goal: Ability to remain free from injury will improve Outcome: Progressing   Problem: Skin Integrity: Goal: Risk for impaired skin integrity will decrease Outcome: Progressing   Problem: Education: Goal: Knowledge of the prescribed therapeutic regimen will improve Outcome: Progressing   Problem: Coping: Goal: Ability to identify and develop effective coping behavior will improve Outcome: Progressing   Problem: Clinical Measurements: Goal: Quality of life will improve Outcome: Progressing   Problem: Respiratory: Goal: Verbalizations of increased ease of respirations will increase Outcome: Progressing   Problem: Role Relationship: Goal: Family's ability to cope with current situation will improve Outcome: Progressing Goal: Ability to verbalize concerns, feelings, and thoughts to partner or family member will improve Outcome: Progressing   Problem: Pain Management: Goal: Satisfaction with pain management regimen will improve Outcome: Progressing

## 2023-11-06 NOTE — Progress Notes (Signed)
Patient has been sleeping during the day and I have been checking on him intermittently and sleeping and unable to wake.  Patient is now sitting up and eating.  Patient said hello and that he is doing ok.

## 2023-11-06 NOTE — Plan of Care (Signed)
  Problem: Skin Integrity: Goal: Risk for impaired skin integrity will decrease Outcome: Progressing   

## 2023-11-06 NOTE — Progress Notes (Signed)
Daily Progress Note   Patient Name: Peter Yang       Date: 11/06/2023 DOB: [birth year]  Age: 68 y.o. MRN#: [id number] Attending Physician: Maretta Bees, MD Primary Care Physician: Patient, No Pcp Per Admit Date: 10/29/2023  Reason for Consultation/Follow-up: Non pain symptom management, Pain control, Psychosocial/spiritual support, and Terminal Care  Subjective: I have reviewed medical records including EPIC notes, MAR, any available advanced directives as necessary, and labs. Received report from primary RN - no acute concerns.  Went to visit patient at bedside - no family/visitors present. Patient was lying in bed - he briefly wakes to gentle touch, is lethargic. No signs or non-verbal gestures of pain or discomfort noted. No respiratory distress, increased work of breathing, or secretions noted. Long periods of apnea noted.  Patient has been accepted to [facility] - waiting for available bed for transfer. He does appear stable for transfer at this time.  Length of Stay: 8  Current Medications: Scheduled Meds:   antiseptic oral rinse  15 mL Topical BID   collagenase   Topical Daily   liver oil-zinc oxide   Topical BID    Continuous Infusions:   PRN Meds: acetaminophen **OR** acetaminophen, diphenhydrAMINE, feeding supplement, glycopyrrolate **OR** glycopyrrolate **OR** glycopyrrolate, haloperidol **OR** haloperidol **OR** haloperidol lactate, LORazepam **OR** LORazepam **OR** LORazepam, morphine injection, ondansetron **OR** ondansetron (ZOFRAN) IV, oxyCODONE, polyethylene glycol, polyvinyl alcohol  Physical Exam Vitals and nursing note reviewed.  Constitutional:      General: He is not in acute distress.    Appearance: He is ill-appearing.  Pulmonary:     Effort: No  respiratory distress.  Skin:    General: Skin is warm and dry.  Neurological:     Mental Status: He is lethargic.             Vital Signs: BP 91/61 (BP Location: Right Arm)   Pulse 85   Temp 97.8 F (36.6 C) (Axillary)   Resp (!) 21   Ht 6' (1.829 m)   Wt 102.7 kg   SpO2 98%   BMI 30.71 kg/m  SpO2: SpO2: 98 % O2 Device: O2 Device: Room Air O2 Flow Rate:    Intake/output summary:  Intake/Output Summary (Last 24 hours) at 11/06/2023 0856 Last data filed at 11/06/2023 0606 Gross per 24 hour  Intake 300 ml  Output 900 ml  Net -600 ml   LBM: Last BM Date : 11/05/23 Baseline Weight: Weight: 79.4 kg Most recent weight: Weight: 102.7 kg       Palliative Assessment/Data: PPS 10%      Patient Active Problem List   Diagnosis Date Noted   ESBL (extended spectrum beta-lactamase) producing bacteria infection 10/31/2023   Bacteremia due to Klebsiella pneumoniae 10/30/2023   Protein-calorie malnutrition, severe 10/30/2023   Shock (HCC) 10/29/2023   History of MDR Pseudomonas aeruginosa infection 06/07/2023   Malnutrition of moderate degree 06/06/2023   Wound dehiscence in puerperium, perineal 06/04/2023   AMS (altered mental status) 05/07/2023   Seizure-like activity (HCC) 05/06/2023   Altered mental status 05/06/2023   Fournier's gangrene in male 04/02/2023   Soft tissue infection 04/02/2023   Necrotizing soft tissue infection 04/02/2023   UTI (urinary tract infection) due to urinary indwelling catheter (HCC) 04/01/2023   S/P BKA (below knee amputation) unilateral, left (HCC) 04/01/2023   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) 04/01/2023   Long term current use of systemic steroids 04/01/2023   Thrombocytopenia (HCC) 10/01/2022   Orthostatic hypotension 09/24/2022   HFrEF (heart failure with reduced ejection fraction) (HCC) 09/23/2022   Counseling and coordination of care 09/15/2022   Pain 09/14/2022   High risk medication use 09/14/2022    Need for emotional support 09/12/2022   Mood altered 09/11/2022   Medication management 09/11/2022   Palliative care by specialist 09/08/2022   Goals of care, counseling/discussion 09/08/2022   Physical deconditioning 09/05/2022   Gross hematuria 08/31/2022   Type 2 diabetes mellitus with diabetic peripheral angiopathy without gangrene, without long-term current use of insulin (HCC)    Impaired functional mobility, balance, gait, and endurance 08/03/2022   Obstructive sleep apnea syndrome 08/03/2022   Pleural effusion 08/03/2022   Catheter-associated urinary tract infection (HCC) 08/03/2022   History of urinary retention 08/03/2022   Mitral valve regurgitation 08/03/2022   Thoracic ascending aortic aneurysm (HCC) 07/23/2022   Borderline low blood pressure determined by examination 07/04/2022   Coronary artery disease due to lipid rich plaque 06/25/2022   Hematuria 06/25/2022   History of non-ST elevation myocardial infarction (NSTEMI) 06/15/2022   PAF (paroxysmal atrial fibrillation) (HCC) 06/15/2022   Dyslipidemia 06/15/2022   PVD (peripheral vascular disease) (HCC) 06/15/2022   Pressure ulcer of sacral region, stage 2 (HCC) 06/15/2022   Chronic HFrEF (heart failure with reduced ejection fraction) (HCC) 06/15/2022   DNR (do not resuscitate) 06/15/2022   Ischemic cardiomyopathy 02/12/2022   Need for assistance with personal care 08/03/2021   Diabetic polyneuropathy associated with type 2 diabetes mellitus (HCC) 07/28/2021   Prolonged QT interval 07/28/2021   Moderate protein-calorie malnutrition (HCC) 06/29/2021   Chronic insomnia 03/31/2021   BPH with obstruction/lower urinary tract symptoms 02/02/2018   Hyperlipidemia 04/15/2016   Vitamin D deficiency 04/09/2016   Essential hypertension 01/25/2016    Palliative Care Assessment & Plan   Patient Profile: 68 y.o. male with past medical history of Fournier's gangrene, DM, chronic hypotension, ureterocutaneous fistula, and  history of debility requiring SNF/ rehab since BKA for necrotizing fasciitis admitted on 10/29/2023 with septic shock - suspected urinary source. Patient with hypotension requiring vasopressors. PMT consulted to discuss GOC.   Assessment: Principal Problem:   Bacteremia due to Klebsiella pneumoniae Active Problems:   Pleural effusion   Shock (HCC)   Protein-calorie malnutrition, severe   ESBL (extended spectrum beta-lactamase) producing bacteria infection   Terminal care  Recommendations/Plan: Continue  full comfort measures Continue DNR/DNI as previously documented  Transfer to [facility] when bed available Continue current comfort focused medication regimen - no changes PMT will continue to follow and support holistically   Symptom Management Morphine PRN severe pain/dyspnea/increased work of breathing/RR>25 Oxycodone PRN moderate pain Tylenol PRN mild pain/fever Biotin twice daily Benadryl PRN itching Robinul PRN secretions Haldol PRN agitation/delirium Ativan PRN anxiety/seizure/sleep/distress Zofran PRN nausea/vomiting Liquifilm Tears PRN dry eye  Goals of Care and Additional Recommendations: Limitations on Scope of Treatment: Full Comfort Care  Code Status:    Code Status Orders  (From admission, onward)           Start     Ordered   11/05/23 1254  Do not attempt resuscitation (DNR) - Comfort care  Continuous       Question Answer Comment  If patient has no pulse and is not breathing Do Not Attempt Resuscitation   In Pre-Arrest Conditions (Patient Is Breathing and Has a Pulse) Provide comfort measures. Relieve any mechanical airway obstruction. Avoid transfer unless required for comfort.   Consent: Discussion documented in EHR or advanced directives reviewed      11/05/23 1255           Code Status History     Date Active Date Inactive Code Status Order ID Comments User Context   11/04/2023 0926 11/05/2023 1255 Do not attempt resuscitation (DNR) -  Comfort care [id number]  Starleen Arms, MD Inpatient   10/29/2023 1640 11/04/2023 0926 Limited: Do not attempt resuscitation (DNR) -DNR-LIMITED -Do Not Intubate/DNI  [id number]  Steffanie Dunn, DO ED   10/29/2023 1546 10/29/2023 1640 Limited: Do not attempt resuscitation (DNR) -DNR-LIMITED -Do Not Intubate/DNI  [id number]  Rondel Baton, MD ED   06/04/2023 1201 06/19/2023 1857 Full Code [id number]  Lurline Del, MD Inpatient   05/06/2023 1013 05/13/2023 2221 DNR [id number]  Merlene Laughter, DO ED   04/01/2023 2237 04/18/2023 1911 DNR [id number]  Carollee Herter, DO ED   08/31/2022 1946 10/24/2022 1827 DNR [id number]  Hillary Bow, DO ED   08/03/2022 0859 08/17/2022 0705 DNR [id number]  Littie Deeds, MD ED   08/02/2022 1829 08/03/2022 0859 Full Code [id number]  Levin Erp, MD ED   06/25/2022 1559 06/30/2022 2102 DNR [id number]  Orland Mustard, MD ED   06/17/2022 1246 06/19/2022 0420 DNR [id number]  Yvonne Kendall, MD Inpatient   06/17/2022 1054 06/17/2022 1246 Full Code [id number]  Yvonne Kendall, MD Inpatient   06/15/2022 1211 06/17/2022 1054 DNR [id number]  Jonah Blue, MD ED   06/15/2022 0604 06/15/2022 1211 Full Code [id number]  Angie Fava, DO ED   07/28/2021 0128 07/31/2021 0211 Full Code [id number]  Shalhoub, Deno Lunger, MD ED       Prognosis:  Days - < week  Discharge Planning: Hospice facility  Care plan was discussed with primary RN  Thank you for allowing the Palliative Medicine Team to assist in the care of this patient.     Haskel Khan, NP  Please contact Palliative Medicine Team phone at [phone] for questions and concerns.   *Portions of this note are a verbal dictation therefore any spelling and/or grammatical errors are due to the "[employer]" system interpretation.

## 2023-11-06 NOTE — Progress Notes (Signed)
PROGRESS NOTE        PATIENT DETAILS Name: Peter Yang Age: 68 y.o. Sex: male Date of Birth: 06-Oct-1956 Admit Date: 10/29/2023 Admitting Physician Steffanie Dunn, DO ZOX:[id number], No Pcp Per  Brief Summary: Patient is a 68 y.o.  male with history of left BKA, history of Fournier's gangrene, chronic HFrEF, adrenal insufficiency, PAF,, chronic indwelling Foley catheter, chronic pain syndrome-who was admitted to the ICU on 1/8 for septic shock in the setting of ESBL bacteremia.  Significant events: 1/8>> admit to ICU-septic shock-ESBL bacteremia 1/11>> transferred to [facility] 1/14>> encephalopathic/hypotensive-stress dose steroids/midodrine/IVF/IV albumin-Dr. Elgergawy discussed with POA-no further escalation in care.  Monitor for 1-2 days-if no improvement-plan for hospice. 1/15>> transition to full comfort measures   Significant studies: 1/8>> CT head: No acute intracranial malady 1/8>> CT abdomen/pelvis: Mild bilateral hydronephrosis-moderate to large bilateral pleural effusions 1/9>> renal ultrasound: No hydronephrosis-improved from prior CT.  Significant microbiology data: 1/8>> COVID/influenza/RSV PCR: Negative 1/8 >>respiratory virus panel: Negative 1/8>> blood culture: ESBL Klebsiella pneumoniae 1/8>> pleural fluid: No growth  Procedures: 1/8>> thoracocentesis-transudative  Consults: None  Subjective: Lethargic but awake  Objective: Vitals: Blood pressure 91/61, pulse 85, temperature 97.8 F (36.6 C), temperature source Axillary, resp. rate (!) 21, height 6' (1.829 m), weight 102.7 kg, SpO2 98%.   Exam: Lying comfortably in bed-not in any distress.  Pertinent Labs/Radiology:    Latest Ref Rng & Units 11/05/2023    4:27 AM 11/04/2023    4:53 PM 11/04/2023    4:46 AM  CBC  WBC 4.0 - 10.5 K/uL 12.7  16.1  18.8   Hemoglobin 13.0 - 17.0 g/dL 7.6  8.2  7.6   Hematocrit 39.0 - 52.0 % 22.8  24.2  22.3   Platelets 150 - 400 K/uL 149  167  209      Lab Results  Component Value Date   NA 133 (L) 11/05/2023   K 4.6 11/05/2023   CL 101 11/05/2023   CO2 23 11/05/2023     Assessment/Plan: Septic shock secondary to ESBL Klebsiella pneumoniae bacteremia Initially required ICU admission/pressors-and had improved-however on 1/14-developed encephalopathy/hypotension.  He was maintained on meropenem-was briefly given stress dose steroids/midodrine/IV albumin-goals of care conversation was continued with family-and subsequently transition to full comfort measures on 1/15.  Awaiting residential hospice.-Remains hypotensive this morning-but medically stable for transfer to residential hospice.  Overall prognosis is poor-life expectancy is suspected to be a few days.     Acute metabolic encephalopathy Secondary to above-lethargic-sleepy Supportive care comfort care in progress.   Normocytic anemia Likely due to acute illness-no indication of blood loss Has required 2 units of PRBC-last transfused on 1/14 No plans to follow CBC-last hemoglobin 7.6 on 1/15.  Comfort care.   Pleural effusion Transudative Cultures negative   Chronic hypotension Blood pressure remains soft-renal function stable No longer on midodrine/albumin/steroids.   AKI Resolved   PAF Telemetry monitoring Eliquis was held due to severity of anemia-no plans to resume-comfort care in progress.   Adrenal insufficiency On chronic steroids-maintained on steroids here-and then given IV hydrocortisone due to recurrent hypotension in 1/14-currently off all steroids-comfort care in progress.    Chronic HFrEF (EF 35% by echo on June 2024) Some lower extremity edema but lying flat Stable-no plans to resume diuretics at this point.   DM-2 Managed with SSI   Chronic pain syndrome Supportive care  History of Fournier's gangrene History of left BKA Long-term SNF resident   Chronic indwelling Foley catheter   Palliative care DNR in place Prior MD had a long  discussion with patient's POA-[name]-no ICU transfer-no pressors-no further escalation in care.  Subsequently palliative care prolonged discussion with family on 1/15-since patient did not significantly improved-remained hypotensive/lethargic-transition to full comfort measures-awaiting residential hospice bed.  Poor quality of life-very poor overall prognosis.     Nutrition Status: Nutrition Problem: Severe Malnutrition Etiology: chronic illness Signs/Symptoms: severe fat depletion, severe muscle depletion, energy intake < or equal to 75% for > or equal to 1 month Interventions: Ensure Enlive (each supplement provides 350kcal and 20 grams of protein), Liberalize Diet  Pressure Ulcer: Agree with assessment and plan as outlined below. Pressure Injury 10/29/23 Heel Right Deep Tissue Pressure Injury - Purple or maroon localized area of discolored intact skin or blood-filled blister due to damage of underlying soft tissue from pressure and/or shear. 3.5x2.5 purple (Active)  10/29/23 2000  Location: Heel  Location Orientation: Right  Staging: Deep Tissue Pressure Injury - Purple or maroon localized area of discolored intact skin or blood-filled blister due to damage of underlying soft tissue from pressure and/or shear.  Wound Description (Comments): 3.5x2.5 purple  Present on Admission: Yes  Dressing Type Foam - Lift dressing to assess site every shift 11/05/23 2000     Pressure Injury 10/30/23 Sacrum Medial Unstageable - Full thickness tissue loss in which the base of the injury is covered by slough (yellow, tan, gray, green or brown) and/or eschar (tan, brown or black) in the wound bed. 7 cm x 6 cm 100% black tan necr (Active)  10/30/23 1002  Location: Sacrum  Location Orientation: Medial  Staging: Unstageable - Full thickness tissue loss in which the base of the injury is covered by slough (yellow, tan, gray, green or brown) and/or eschar (tan, brown or black) in the wound bed.  Wound  Description (Comments): 7 cm x 6 cm 100% black tan necrotic tissue  Present on Admission: Yes  Dressing Type Santyl;Foam - Lift dressing to assess site every shift;Gauze (Comment) 11/05/23 2130   Class 1 Obesity: Estimated body mass index is 30.71 kg/m as calculated from the following:   Height as of this encounter: 6' (1.829 m).   Weight as of this encounter: 102.7 kg.    The patient is critically ill with multiple organ system failure and requires high complexity decision making for assessment and support, frequent evaluation and titration of therapies, advanced monitoring, review of radiographic studies and interpretation of complex data.   Total Critical time spent equals 45 minutes  Code status:   Code Status: Do not attempt resuscitation (DNR) - Comfort care   DVT Prophylaxis:    Family Communication: Left voicemail for her friend/POA-[name] [phone] on 1/15.   Disposition Plan: Status is: Inpatient Remains inpatient appropriate because: Severity of illness   Planned Discharge Destination:Skilled nursing facility   Diet: Diet Order             Diet - low sodium heart healthy           Diet regular Room service appropriate? Yes; Fluid consistency: Thin  Diet effective now                     Antimicrobial agents: Anti-infectives (From admission, onward)    Start     Dose/Rate Route Frequency Ordered Stop   10/31/23 0600  meropenem (MERREM) 1 g in sodium chloride  0.9 % 100 mL IVPB  Status:  Discontinued        1 g 200 mL/hr over 30 Minutes Intravenous Every 8 hours 10/30/23 2252 11/05/23 1255   10/29/23 2300  metroNIDAZOLE (FLAGYL) IVPB 500 mg  Status:  Discontinued        500 mg 100 mL/hr over 60 Minutes Intravenous Every 8 hours 10/29/23 1737 10/30/23 0958   10/29/23 1745  ceftazidime-avibactam (AVYCAZ) 1.25 g in dextrose 5 % 50 mL IVPB  Status:  Discontinued        1.25 g 25 mL/hr over 2 Hours Intravenous Every 8 hours 10/29/23 1737 10/30/23  2243   10/29/23 1645  doxycycline (VIBRAMYCIN) 100 mg in dextrose 5 % 250 mL IVPB  Status:  Discontinued        100 mg 125 mL/hr over 120 Minutes Intravenous Every 12 hours 10/29/23 1642 10/30/23 0957   10/29/23 1430  vancomycin (VANCOREADY) IVPB 1750 mg/350 mL        1,750 mg 175 mL/hr over 120 Minutes Intravenous  Once 10/29/23 1423 10/29/23 1827   10/29/23 1415  ceFEPIme (MAXIPIME) 2 g in sodium chloride 0.9 % 100 mL IVPB        2 g 200 mL/hr over 30 Minutes Intravenous  Once 10/29/23 1409 10/29/23 1552   10/29/23 1415  metroNIDAZOLE (FLAGYL) IVPB 500 mg        500 mg 100 mL/hr over 60 Minutes Intravenous  Once 10/29/23 1409 10/29/23 1645   10/29/23 1415  vancomycin (VANCOCIN) IVPB 1000 mg/200 mL premix  Status:  Discontinued        1,000 mg 200 mL/hr over 60 Minutes Intravenous  Once 10/29/23 1409 10/29/23 1423        MEDICATIONS: Scheduled Meds:  antiseptic oral rinse  15 mL Topical BID   collagenase   Topical Daily   liver oil-zinc oxide   Topical BID   Continuous Infusions:   PRN Meds:.acetaminophen **OR** acetaminophen, diphenhydrAMINE, feeding supplement, glycopyrrolate **OR** glycopyrrolate **OR** glycopyrrolate, haloperidol **OR** haloperidol **OR** haloperidol lactate, LORazepam **OR** LORazepam **OR** LORazepam, morphine injection, ondansetron **OR** ondansetron (ZOFRAN) IV, oxyCODONE, polyethylene glycol, polyvinyl alcohol   I have personally reviewed following labs and imaging studies  LABORATORY DATA: CBC: Recent Labs  Lab 11/02/23 0241 11/03/23 0425 11/04/23 0446 11/04/23 1653 11/05/23 0427  WBC 23.7* 17.7* 18.8* 16.1* 12.7*  HGB 9.5* 7.3* 7.6* 8.2* 7.6*  HCT 28.5* 21.2* 22.3* 24.2* 22.8*  MCV 86.9 86.5 85.1 84.9 86.4  PLT 300 211 209 167 149*    Basic Metabolic Panel: Recent Labs  Lab 11/01/23 0507 11/02/23 0241 11/03/23 0425 11/04/23 0446 11/04/23 0449 11/05/23 0427  NA 127* 130* 126*  --  131* 133*  K 4.4 3.8 4.1  --  4.2 4.6  CL 97*  96* 95*  --  99 101  CO2 25 24 23   --  23 23  GLUCOSE 177* 95 114*  --  125* 128*  BUN 46* 42* 39*  --  40* 37*  CREATININE 1.01 0.76 0.73  --  0.99 0.85  CALCIUM 7.7* 8.1* 7.9*  --  8.0* 8.2*  MG 2.4 2.5* 2.2 2.6*  --  3.0*  PHOS 1.6* 2.4* 2.1* 2.7  --  3.0    GFR: Estimated Creatinine Clearance: 105.9 mL/min (by C-G formula based on SCr of 0.85 mg/dL).  Liver Function Tests: No results for input(s): "AST", "ALT", "ALKPHOS", "BILITOT", "PROT", "ALBUMIN" in the last 168 hours.  No results for input(s): "LIPASE", "AMYLASE" in the last  168 hours.  No results for input(s): "AMMONIA" in the last 168 hours.  Coagulation Profile: No results for input(s): "INR", "PROTIME" in the last 168 hours.   Cardiac Enzymes: No results for input(s): "CKTOTAL", "CKMB", "CKMBINDEX", "TROPONINI" in the last 168 hours.  BNP (last 3 results) No results for input(s): "PROBNP" in the last 8760 hours.  Lipid Profile: No results for input(s): "CHOL", "HDL", "LDLCALC", "TRIG", "CHOLHDL", "LDLDIRECT" in the last 72 hours.  Thyroid Function Tests: No results for input(s): "TSH", "T4TOTAL", "FREET4", "T3FREE", "THYROIDAB" in the last 72 hours.  Anemia Panel: No results for input(s): "VITAMINB12", "FOLATE", "FERRITIN", "TIBC", "IRON", "RETICCTPCT" in the last 72 hours.  Urine analysis:    Component Value Date/Time   COLORURINE RED (A) 10/29/2023 1930   APPEARANCEUR TURBID (A) 10/29/2023 1930   LABSPEC  10/29/2023 1930    TEST NOT REPORTED DUE TO COLOR INTERFERENCE OF URINE PIGMENT   PHURINE  10/29/2023 1930    TEST NOT REPORTED DUE TO COLOR INTERFERENCE OF URINE PIGMENT   GLUCOSEU (A) 10/29/2023 1930    TEST NOT REPORTED DUE TO COLOR INTERFERENCE OF URINE PIGMENT   HGBUR (A) 10/29/2023 1930    TEST NOT REPORTED DUE TO COLOR INTERFERENCE OF URINE PIGMENT   BILIRUBINUR (A) 10/29/2023 1930    TEST NOT REPORTED DUE TO COLOR INTERFERENCE OF URINE PIGMENT   KETONESUR (A) 10/29/2023 1930    TEST NOT  REPORTED DUE TO COLOR INTERFERENCE OF URINE PIGMENT   PROTEINUR (A) 10/29/2023 1930    TEST NOT REPORTED DUE TO COLOR INTERFERENCE OF URINE PIGMENT   NITRITE (A) 10/29/2023 1930    TEST NOT REPORTED DUE TO COLOR INTERFERENCE OF URINE PIGMENT   LEUKOCYTESUR (A) 10/29/2023 1930    TEST NOT REPORTED DUE TO COLOR INTERFERENCE OF URINE PIGMENT    Sepsis Labs: Lactic Acid, Venous    Component Value Date/Time   LATICACIDVEN 1.5 11/04/2023 0753    MICROBIOLOGY: Recent Results (from the past 240 hours)  Resp panel by RT-PCR (RSV, Flu A&B, Covid) Anterior Nasal Swab     Status: None   Collection Time: 10/29/23  2:09 PM   Specimen: Anterior Nasal Swab  Result Value Ref Range Status   SARS Coronavirus 2 by RT PCR NEGATIVE NEGATIVE Final   Influenza A by PCR NEGATIVE NEGATIVE Final   Influenza B by PCR NEGATIVE NEGATIVE Final    Comment: (NOTE) The Xpert Xpress SARS-CoV-2/FLU/RSV plus assay is intended as an aid in the diagnosis of influenza from Nasopharyngeal swab specimens and should not be used as a sole basis for treatment. Nasal washings and aspirates are unacceptable for Xpert Xpress SARS-CoV-2/FLU/RSV testing.  Fact Sheet for Patients: BloggerCourse.com  Fact Sheet for Healthcare Providers: SeriousBroker.it  This test is not yet approved or cleared by the [place] FDA and has been authorized for detection and/or diagnosis of SARS-CoV-2 by FDA under an Emergency Use Authorization (EUA). This EUA will remain in effect (meaning this test can be used) for the duration of the COVID-19 declaration under Section 564(b)(1) of the Act, 21 U.S.C. section 360bbb-3(b)(1), unless the authorization is terminated or revoked.     Resp Syncytial Virus by PCR NEGATIVE NEGATIVE Final    Comment: (NOTE) Fact Sheet for Patients: BloggerCourse.com  Fact Sheet for Healthcare  Providers: SeriousBroker.it  This test is not yet approved or cleared by the [place] FDA and has been authorized for detection and/or diagnosis of SARS-CoV-2 by FDA under an Emergency Use Authorization (EUA). This EUA will remain  in effect (meaning this test can be used) for the duration of the COVID-19 declaration under Section 564(b)(1) of the Act, 21 U.S.C. section 360bbb-3(b)(1), unless the authorization is terminated or revoked.  Performed at [facility], [address], [place], [place] [address]   Blood Culture (routine x 2)     Status: Abnormal   Collection Time: 10/29/23  2:14 PM   Specimen: BLOOD LEFT ARM  Result Value Ref Range Status   Specimen Description BLOOD LEFT ARM  Final   Special Requests   Final    BOTTLES DRAWN AEROBIC AND ANAEROBIC Blood Culture adequate volume   Culture  Setup Time   Final    GRAM NEGATIVE RODS IN BOTH AEROBIC AND ANAEROBIC BOTTLES CRITICAL RESULT CALLED TO, READ BACK BY AND VERIFIED WITH: PHARMD ANDREW MEYER ON 10/30/23 @ 1608 BY DRT Performed at [facility], [address], [place], [place] [address]    Culture (A)  Final    KLEBSIELLA PNEUMONIAE Confirmed Extended Spectrum Beta-Lactamase Producer (ESBL).  In bloodstream infections from ESBL organisms, carbapenems are preferred over piperacillin/tazobactam. They are shown to have a lower risk of mortality.    Report Status 11/01/2023 FINAL  Final   Organism ID, Bacteria KLEBSIELLA PNEUMONIAE  Final      Susceptibility   Klebsiella pneumoniae - MIC*    AMPICILLIN >=32 RESISTANT Resistant     CEFEPIME >=32 RESISTANT Resistant     CEFTAZIDIME >=64 RESISTANT Resistant     CEFTRIAXONE >=64 RESISTANT Resistant     CIPROFLOXACIN >=4 RESISTANT Resistant     GENTAMICIN >=16 RESISTANT Resistant     IMIPENEM 1 SENSITIVE Sensitive     TRIMETH/SULFA >=320 RESISTANT Resistant     AMPICILLIN/SULBACTAM >=32 RESISTANT Resistant     PIP/TAZO 64  INTERMEDIATE Intermediate ug/mL    * KLEBSIELLA PNEUMONIAE  Blood Culture ID Panel (Reflexed)     Status: Abnormal   Collection Time: 10/29/23  2:14 PM  Result Value Ref Range Status   Enterococcus faecalis NOT DETECTED NOT DETECTED Final   Enterococcus Faecium NOT DETECTED NOT DETECTED Final   Listeria monocytogenes NOT DETECTED NOT DETECTED Final   Staphylococcus species NOT DETECTED NOT DETECTED Final   Staphylococcus aureus (BCID) NOT DETECTED NOT DETECTED Final   Staphylococcus epidermidis NOT DETECTED NOT DETECTED Final   Staphylococcus lugdunensis NOT DETECTED NOT DETECTED Final   Streptococcus species NOT DETECTED NOT DETECTED Final   Streptococcus agalactiae NOT DETECTED NOT DETECTED Final   Streptococcus pneumoniae NOT DETECTED NOT DETECTED Final   Streptococcus pyogenes NOT DETECTED NOT DETECTED Final   A.calcoaceticus-baumannii NOT DETECTED NOT DETECTED Final   Bacteroides fragilis NOT DETECTED NOT DETECTED Final   Enterobacterales DETECTED (A) NOT DETECTED Final    Comment: Enterobacterales represent a large order of gram negative bacteria, not a single organism. CRITICAL RESULT CALLED TO, READ BACK BY AND VERIFIED WITH: PHARMD ANDREW MEYER ON 10/30/23 @ 1608 BY DRT    Enterobacter cloacae complex NOT DETECTED NOT DETECTED Final   Escherichia coli NOT DETECTED NOT DETECTED Final   Klebsiella aerogenes NOT DETECTED NOT DETECTED Final   Klebsiella oxytoca NOT DETECTED NOT DETECTED Final   Klebsiella pneumoniae DETECTED (A) NOT DETECTED Final    Comment: CRITICAL RESULT CALLED TO, READ BACK BY AND VERIFIED WITH: PHARMD ANDREW MEYER ON 10/30/23 @ 1608 BY DRT    Proteus species NOT DETECTED NOT DETECTED Final   Salmonella species NOT DETECTED NOT DETECTED Final   Serratia marcescens NOT DETECTED NOT  DETECTED Final   Haemophilus influenzae NOT DETECTED NOT DETECTED Final   Neisseria meningitidis NOT DETECTED NOT DETECTED Final   Pseudomonas aeruginosa NOT DETECTED NOT DETECTED  Final   Stenotrophomonas maltophilia NOT DETECTED NOT DETECTED Final   Candida albicans NOT DETECTED NOT DETECTED Final   Candida auris NOT DETECTED NOT DETECTED Final   Candida glabrata NOT DETECTED NOT DETECTED Final   Candida krusei NOT DETECTED NOT DETECTED Final   Candida parapsilosis NOT DETECTED NOT DETECTED Final   Candida tropicalis NOT DETECTED NOT DETECTED Final   Cryptococcus neoformans/gattii NOT DETECTED NOT DETECTED Final   CTX-M ESBL DETECTED (A) NOT DETECTED Final    Comment: CRITICAL RESULT CALLED TO, READ BACK BY AND VERIFIED WITH: PHARMD ANDREW MEYER ON 10/30/23 @ 1608 BY DRT (NOTE) Extended spectrum beta-lactamase detected. Recommend a carbapenem as initial therapy.      Carbapenem resistance IMP NOT DETECTED NOT DETECTED Final   Carbapenem resistance KPC NOT DETECTED NOT DETECTED Final   Carbapenem resistance NDM NOT DETECTED NOT DETECTED Final   Carbapenem resist OXA 48 LIKE NOT DETECTED NOT DETECTED Final   Carbapenem resistance VIM NOT DETECTED NOT DETECTED Final    Comment: Performed at [facility], [address] [place], [place] [address]  Blood Culture (routine x 2)     Status: None   Collection Time: 10/29/23  2:57 PM   Specimen: BLOOD LEFT ARM  Result Value Ref Range Status   Specimen Description BLOOD LEFT ARM  Final   Special Requests   Final    BOTTLES DRAWN AEROBIC AND ANAEROBIC Blood Culture results may not be optimal due to an inadequate volume of blood received in culture bottles   Culture   Final    NO GROWTH 5 DAYS Performed at [facility], [address] [place], [place] [address]    Report Status 11/03/2023 FINAL  Final  Respiratory (~20 pathogens) panel by PCR     Status: None   Collection Time: 10/29/23  5:31 PM   Specimen: Pleural; Respiratory  Result Value Ref Range Status   Adenovirus NOT DETECTED NOT DETECTED Final   Coronavirus 229E NOT DETECTED NOT DETECTED Final    Comment: (NOTE) The Coronavirus on the  Respiratory Panel, DOES NOT test for the novel  Coronavirus (2019 nCoV)    Coronavirus HKU1 NOT DETECTED NOT DETECTED Final   Coronavirus NL63 NOT DETECTED NOT DETECTED Final   Coronavirus OC43 NOT DETECTED NOT DETECTED Final   Metapneumovirus NOT DETECTED NOT DETECTED Final   Rhinovirus / Enterovirus NOT DETECTED NOT DETECTED Final   Influenza A NOT DETECTED NOT DETECTED Final   Influenza B NOT DETECTED NOT DETECTED Final   Parainfluenza Virus 1 NOT DETECTED NOT DETECTED Final   Parainfluenza Virus 2 NOT DETECTED NOT DETECTED Final   Parainfluenza Virus 3 NOT DETECTED NOT DETECTED Final   Parainfluenza Virus 4 NOT DETECTED NOT DETECTED Final   Respiratory Syncytial Virus NOT DETECTED NOT DETECTED Final   Bordetella pertussis NOT DETECTED NOT DETECTED Final   Bordetella Parapertussis NOT DETECTED NOT DETECTED Final   Chlamydophila pneumoniae NOT DETECTED NOT DETECTED Final   Mycoplasma pneumoniae NOT DETECTED NOT DETECTED Final    Comment: Performed at [facility], [address], [place], [place] [address]  Body fluid culture w Gram Stain     Status: None   Collection Time: 10/29/23  7:10 PM   Specimen: Pleural Fluid  Result Value Ref Range Status   Specimen Description  PLEURAL  Final   Special Requests NONE  Final   Gram Stain   Final    WBC PRESENT,BOTH PMN AND MONONUCLEAR NO ORGANISMS SEEN CYTOSPIN SMEAR    Culture   Final    NO GROWTH 3 DAYS Performed at [facility], [address], [place], [place] [address]    Report Status 11/02/2023 FINAL  Final  MRSA Next Gen by PCR, Nasal     Status: Abnormal   Collection Time: 10/29/23  7:30 PM  Result Value Ref Range Status   MRSA by PCR Next Gen DETECTED (A) NOT DETECTED Final    Comment: RESULT CALLED TO, READ BACK BY AND VERIFIED WITH:  W. HUTCHES 10/29/2023 @ 2217 BY AB (NOTE) The GeneXpert MRSA Assay (FDA approved for NASAL specimens only), is one component of a comprehensive MRSA colonization  surveillance program. It is not intended to diagnose MRSA infection nor to guide or monitor treatment for MRSA infections. Test performance is not FDA approved in patients less than 33 years old. Performed at [facility], [address], [place], [place] [address]     RADIOLOGY STUDIES/RESULTS: No results found.    LOS: 8 days   Jeoffrey Massed, MD  [facility]    To contact the attending provider between 7A-7P or the covering provider during after hours 7P-7A, please log into the web site [email or web address] and access using universal [place] password for that web site. If you do not have the password, please call the hospital operator.  11/06/2023, 9:25 AM

## 2023-11-06 NOTE — Discharge Summary (Addendum)
PATIENT DETAILS Name: Peter Yang Age: 68 y.o. Sex: male Date of Birth: [birth year] MRN: [id number]. Admitting Physician: Steffanie Dunn, DO WUJ:[id number], No Pcp Per  Admit Date: 10/29/2023 Discharge date: 11/08/2023  Recommendations for Outpatient Follow-up:  Optimize comfort measures.  Admitted From:  SNF  Disposition: Hospice care   Discharge Condition: poor  CODE STATUS:   Code Status: Do not attempt resuscitation (DNR) - Comfort care   Diet recommendation:  Diet Order             Diet - low sodium heart healthy           Diet regular Room service appropriate? Yes; Fluid consistency: Thin  Diet effective now                    Brief Summary: Patient is a 68 y.o.  male with history of left BKA, history of Fournier's gangrene, chronic HFrEF, adrenal insufficiency, PAF,, chronic indwelling Foley catheter, chronic pain syndrome-who was admitted to the ICU on 1/8 for septic shock in the setting of ESBL bacteremia.   Significant events: 1/8>> admit to ICU-septic shock-ESBL bacteremia 1/11>> transferred to [facility] 1/14>> encephalopathic/hypotensive-stress dose steroids/midodrine/IVF/IV albumin-Dr. Elgergawy discussed with POA-no further escalation in care.  Monitor for 1-2 days-if no improvement-plan for hospice. 1/15>> transition to full comfort measures   Significant studies: 1/8>> CT head: No acute intracranial malady 1/8>> CT abdomen/pelvis: Mild bilateral hydronephrosis-moderate to large bilateral pleural effusions 1/9>> renal ultrasound: No hydronephrosis-improved from prior CT.   Significant microbiology data: 1/8>> COVID/influenza/RSV PCR: Negative 1/8 >>respiratory virus panel: Negative 1/8>> blood culture: ESBL Klebsiella pneumoniae 1/8>> pleural fluid: No growth   Procedures: 1/8>> thoracocentesis-transudative   Consults: None  Brief Hospital Course: Septic shock secondary to ESBL Klebsiella pneumoniae bacteremia Initially required ICU  admission/pressors-and had improved-however on 1/14-developed encephalopathy/hypotension.  He was maintained on meropenem-was briefly given stress dose steroids/midodrine/IV albumin-goals of care conversation was continued with family-and subsequently transition to full comfort measures on 1/15.  Awaiting residential hospice.-Remains hypotensive this morning-but medically stable for transfer to residential hospice.  Overall prognosis is poor-life expectancy is suspected to be a few days.     Acute metabolic encephalopathy Secondary to above-lethargic-sleepy Supportive care comfort care in progress.   Normocytic anemia Likely due to acute illness-no indication of blood loss Has required 2 units of PRBC-last transfused on 1/14 No plans to follow CBC-last hemoglobin 7.6 on 1/15.  Comfort care.   Pleural effusion Transudative Cultures negative   Chronic hypotension Blood pressure remains soft-renal function stable No longer on midodrine/albumin/steroids.   AKI Resolved   PAF Telemetry monitoring Eliquis was held due to severity of anemia-no plans to resume-comfort care in progress.   Adrenal insufficiency On chronic steroids-maintained on steroids here-and then given IV hydrocortisone due to recurrent hypotension in 1/14-currently off all steroids-comfort care in progress.    Chronic HFrEF (EF 35% by echo on June 2024) Some lower extremity edema but lying flat Stable-no plans to resume diuretics at this point.   DM-2 Managed with SSI  Chronic pain syndrome Supportive care   History of Fournier's gangrene History of left BKA Long-term SNF resident   Chronic indwelling Foley catheter   Palliative care DNR in place Prior MD had a long discussion with patient's POA-[name]-no ICU transfer-no pressors-no further escalation in care.  Subsequently palliative care prolonged discussion with family on 1/15-since patient did not significantly improved-remained  hypotensive/lethargic-transition to full comfort measures-awaiting residential hospice bed.  Poor quality of life-very poor  overall prognosis.     Nutrition Status: Nutrition Problem: Severe Malnutrition Etiology: chronic illness Signs/Symptoms: severe fat depletion, severe muscle depletion, energy intake < or equal to 75% for > or equal to 1 month Interventions: Ensure Enlive (each supplement provides 350kcal and 20 grams of protein), Liberalize Diet   Pressure Ulcer: Agree with assessment and plan as outlined below. Pressure Injury 10/29/23 Heel Right Deep Tissue Pressure Injury - Purple or maroon localized area of discolored intact skin or blood-filled blister due to damage of underlying soft tissue from pressure and/or shear. 3.5x2.5 purple (Active)  10/29/23 2000  Location: Heel  Location Orientation: Right  Staging: Deep Tissue Pressure Injury - Purple or maroon localized area of discolored intact skin or blood-filled blister due to damage of underlying soft tissue from pressure and/or shear.  Wound Description (Comments): 3.5x2.5 purple  Present on Admission: Yes  Dressing Type Foam - Lift dressing to assess site every shift 11/08/23 0900     Pressure Injury 10/30/23 Sacrum Medial Unstageable - Full thickness tissue loss in which the base of the injury is covered by slough (yellow, tan, gray, green or brown) and/or eschar (tan, brown or black) in the wound bed. 7 cm x 6 cm 100% black tan necr (Active)  10/30/23 1002  Location: Sacrum  Location Orientation: Medial  Staging: Unstageable - Full thickness tissue loss in which the base of the injury is covered by slough (yellow, tan, gray, green or brown) and/or eschar (tan, brown or black) in the wound bed.  Wound Description (Comments): 7 cm x 6 cm 100% black tan necrotic tissue  Present on Admission: Yes  Dressing Type Foam - Lift dressing to assess site every shift 11/08/23 0900    Nutrition Status: Nutrition Problem: Severe  Malnutrition Etiology: chronic illness Signs/Symptoms: severe fat depletion, severe muscle depletion, energy intake < or equal to 75% for > or equal to 1 month Interventions: Ensure Enlive (each supplement provides 350kcal and 20 grams of protein), Liberalize Diet    Discharge Diagnoses:  Principal Problem:   Bacteremia due to Klebsiella pneumoniae Active Problems:   Pleural effusion   Shock (HCC)   Protein-calorie malnutrition, severe   ESBL (extended spectrum beta-lactamase) producing bacteria infection   Discharge Instructions:  Activity:  As tolerated with Full fall precautions use walker/cane & assistance as needed   Discharge Instructions     Diet - low sodium heart healthy   Complete by: As directed    Discharge wound care:   Complete by: As directed    Wound care  Daily      Comments: Cleanse sacral wound with Vashe wound cleanser Hart [place] [phone]), Apply 1/4" thick layer of Santyl to wound bed, top with saline moist gauze, cover with dry gauze and silicone foam or ABD pad whichever is preferred.    Wound care  Daily      Comments: Paint right plantar surface wound with betadine daily, allow to air dry    Wound care  Daily      Comments: Silicone foam dressing to the right heel wound, change every 3 days. ASSESS UNDER dressings each shift for any acute changes in the wounds.   Increase activity slowly   Complete by: As directed       Allergies as of 11/08/2023       Reactions   Sglt2 Inhibitors Other (See Comments)   Fournier's gangrene        Medication List     STOP taking these medications  acetaminophen 500 MG tablet Commonly known as: TYLENOL   albuterol (2.5 MG/3ML) 0.083% nebulizer solution Commonly known as: PROVENTIL   albuterol 108 (90 Base) MCG/ACT inhaler Commonly known as: VENTOLIN HFA   apixaban 5 MG Tabs tablet Commonly known as: ELIQUIS   ascorbic acid 500 MG tablet Commonly known as: VITAMIN C   aspirin 81 MG chewable  tablet   atorvastatin 40 MG tablet Commonly known as: LIPITOR   bisacodyl 10 MG suppository Commonly known as: DULCOLAX   ciprofloxacin 500 MG tablet Commonly known as: CIPRO   Clotrimazole 1 % Oint   diclofenac Sodium 1 % Gel Commonly known as: VOLTAREN   docusate sodium 100 MG capsule Commonly known as: COLACE   escitalopram 10 MG tablet Commonly known as: LEXAPRO   feeding supplement (GLUCERNA SHAKE) Liqd   feeding supplement (PRO-STAT SUGAR FREE 64) Liqd   ferrous sulfate 325 (65 FE) MG tablet   finasteride 5 MG tablet Commonly known as: PROSCAR   folic acid 1 MG tablet Commonly known as: FOLVITE   furosemide 40 MG tablet Commonly known as: LASIX   gabapentin 300 MG capsule Commonly known as: NEURONTIN   HumaLOG KwikPen 100 UNIT/ML KwikPen Generic drug: insulin lispro   hydrocortisone 5 MG tablet Commonly known as: CORTEF   insulin glargine-yfgn 100 UNIT/ML injection Commonly known as: SEMGLEE   midodrine 10 MG tablet Commonly known as: PROAMATINE   multivitamin with minerals Tabs tablet   nystatin powder Commonly known as: MYCOSTATIN/NYSTOP   oxyCODONE 15 mg 12 hr tablet Commonly known as: OXYCONTIN Replaced by: oxyCODONE 5 MG immediate release tablet   oxycodone 5 MG capsule Commonly known as: OXY-IR   pantoprazole 40 MG tablet Commonly known as: PROTONIX   saccharomyces boulardii 250 MG capsule Commonly known as: FLORASTOR   tamsulosin 0.4 MG Caps capsule Commonly known as: FLOMAX   traZODone 50 MG tablet Commonly known as: DESYREL   Tresiba FlexTouch 100 UNIT/ML FlexTouch Pen Generic drug: insulin degludec       TAKE these medications    glycopyrrolate 1 MG tablet Commonly known as: ROBINUL Take 1 tablet (1 mg total) by mouth every 4 (four) hours as needed (excessive secretions).   haloperidol 2 MG tablet Commonly known as: HALDOL Take 1 tablet (2 mg total) by mouth every 6 (six) hours as needed for agitation (or  delirium).   LORazepam 1 MG tablet Commonly known as: ATIVAN Take 1 tablet (1 mg total) by mouth every hour as needed for anxiety, seizure or sleep (distress).   morphine (PF) 2 MG/ML injection Inject 1 mL (2 mg total) into the vein every 2 (two) hours as needed (To alleviate signs and symptoms of distress, RR>25, dyspnea, increased work of breathing).   oxyCODONE 5 MG immediate release tablet Commonly known as: Oxy IR/ROXICODONE Take 1 tablet (5 mg total) by mouth every 6 (six) hours as needed for moderate pain (pain score 4-6). Replaces: oxyCODONE 15 mg 12 hr tablet               Discharge Care Instructions  (From admission, onward)           Start     Ordered   11/06/23 0000  Discharge wound care:       Comments: Wound care  Daily      Comments: Cleanse sacral wound with Vashe wound cleanser Hart [place] [phone]), Apply 1/4" thick layer of Santyl to wound bed, top with saline moist gauze, cover with dry gauze and silicone foam or  ABD pad whichever is preferred.    Wound care  Daily      Comments: Paint right plantar surface wound with betadine daily, allow to air dry    Wound care  Daily      Comments: Silicone foam dressing to the right heel wound, change every 3 days. ASSESS UNDER dressings each shift for any acute changes in the wounds.   11/06/23 0924            Follow-up Information     Primary care practitioner. Schedule an appointment as soon as possible for a visit.   Why: As needed               Allergies  Allergen Reactions   Sglt2 Inhibitors Other (See Comments)    Fournier's gangrene     Other Procedures/Studies: [place] EKG SITE RITE Result Date: 11/04/2023 If Site Rite image not attached, placement could not be confirmed due to current cardiac rhythm.  [place] EKG SITE RITE Result Date: 10/31/2023 If Site Rite image not attached, placement could not be confirmed due to current cardiac rhythm.  US RENAL Result Date: 10/30/2023 CLINICAL DATA:   Hematuria EXAM: RENAL / URINARY TRACT ULTRASOUND COMPLETE COMPARISON:  CT 10/29/2023. FINDINGS: Right Kidney: Renal measurements: 11.6 x 5.4 x 5.2 cm = volume: 170.4 mL. Echogenicity within normal limits. No mass or hydronephrosis visualized. Previous collecting system dilatation is no longer seen. Left Kidney: Renal measurements: 10.7 x 6.0 x 5.3 cm = volume: 178.0 mL. Echogenicity within normal limits. No mass or hydronephrosis visualized. No collecting system dilatation. Bladder: Contracted with a Foley catheter.  There is some wall thickening. Other: Right-sided pleural effusion identified. Fatty liver infiltration. Distended gallbladder with sludge. Overall study is significantly limited by overlapping bowel gas and soft tissue. Particularly of the left kidney IMPRESSION: Limited examination. No collecting system dilatation seen on the current ultrasound. This has improved from prior CT. Foley catheter in the bladder. Note is made of a right pleural effusion. Distended gallbladder with sludge. Further workup as clinically appropriate Electronically Signed   By: Karen Kays M.D.   On: 10/30/2023 14:00   DG CHEST PORT 1 VIEW Result Date: 10/29/2023 CLINICAL DATA:  Status post left subclavian central line placement EXAM: PORTABLE CHEST 1 VIEW COMPARISON:  Film from earlier in the same day. FINDINGS: Cardiac shadow is stable. New left subclavian central line is noted with the catheter tip at the cavoatrial junction. Pleural effusions are noted right greater than left. No focal confluent infiltrate is seen. No bony abnormality is noted. No pneumothorax is seen. IMPRESSION: No pneumothorax following central line placement. Electronically Signed   By: Alcide Clever M.D.   On: 10/29/2023 20:52   CT ABDOMEN PELVIS W CONTRAST Result Date: 10/29/2023 CLINICAL DATA:  Nausea, vomiting EXAM: CT ABDOMEN AND PELVIS WITH CONTRAST TECHNIQUE: Multidetector CT imaging of the abdomen and pelvis was performed using the standard  protocol following bolus administration of intravenous contrast. RADIATION DOSE REDUCTION: This exam was performed according to the departmental dose-optimization program which includes automated exposure control, adjustment of the mA and/or kV according to patient size and/or use of iterative reconstruction technique. CONTRAST:  75mL OMNIPAQUE IOHEXOL 350 MG/ML SOLN COMPARISON:  06/04/2023 FINDINGS: Lower chest: Coronary artery and aortic atherosclerosis. Moderate to large bilateral pleural effusions, right larger than left. Compressive atelectasis in the lower lobes. Hepatobiliary: Small layering gallstones within the gallbladder. No focal hepatic abnormality or biliary ductal dilatation. Pancreas: No focal abnormality or ductal dilatation. Spleen: No  focal abnormality.  Normal size. Adrenals/Urinary Tract: Mild bilateral hydronephrosis. Foley catheter present in the bladder and bladder is decompressed, but bladder wall appears thickened. No ureteral stones. Adrenal glands unremarkable. Stomach/Bowel: Stomach, large and small bowel grossly unremarkable. Vascular/Lymphatic: Aortic atherosclerosis. No evidence of aneurysm or adenopathy. Reproductive: No visible focal abnormality. Other: Trace free fluid in the pelvis.  No free air. Musculoskeletal: No acute bony abnormality. Edema throughout the subcutaneous soft tissues compatible with anasarca. IMPRESSION: Mild bilateral hydronephrosis to the level of the bladder. No obstructing stones present. Wall the bladder is collapsed with Foley catheter in place, bladder wall appears thickened. This could be related to chronic bladder outlet obstruction or cystitis. Moderate to large bilateral pleural effusions, right greater than left. Compressive atelectasis in the lower lobes. Coronary artery disease, aortic atherosclerosis. Anasarca like edema throughout the soft tissues. Electronically Signed   By: Charlett Nose M.D.   On: 10/29/2023 19:28   CT HEAD WO CONTRAST  ( ) Result Date: 10/29/2023 CLINICAL DATA:  Encephalopathy. EXAM: CT HEAD WITHOUT CONTRAST TECHNIQUE: Contiguous axial images were obtained from the base of the skull through the vertex without intravenous contrast. RADIATION DOSE REDUCTION: This exam was performed according to the departmental dose-optimization program which includes automated exposure control, adjustment of the mA and/or kV according to patient size and/or use of iterative reconstruction technique. 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 Remote lacunar infarct in the right globus pallidus is stable no acute infarct, hemorrhage, or mass lesion is present. Asymmetric ex vacuo dilation of the right lateral ventricle is noted. No significant extraaxial fluid collection is present. The brainstem and cerebellum are within normal limits. Midline structures are within normal limits. Vascular: Atherosclerotic calcifications are present within the cavernous internal carotid arteries bilaterally and at the dural margin vertebral arteries. No hyperdense vessel is present. Skull: Calvarium is intact. No focal lytic or blastic lesions are present. No significant extracranial soft tissue lesion is present. Sinuses/Orbits: Right lens replacement is present. Globes and orbits are otherwise within normal limits. The paranasal sinuses and mastoid air cells are clear. IMPRESSION: 1. No acute intracranial abnormality or significant interval change. 2. 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 3. Stable remote lacunar infarct in the right globus pallidus. Electronically Signed   By: Marin Roberts M.D.   On: 10/29/2023 18:34   DG Chest Port 1 View Result Date: 10/29/2023 CLINICAL DATA:  Sepsis.   Evaluate for abnormality. EXAM: PORTABLE CHEST 1 VIEW COMPARISON:  06/11/2023. FINDINGS: Low lung volume. There is at least small right pleural effusion with probable associated compressive atelectatic changes and/or consolidation in the right lung base. There is also left retrocardiac airspace opacity obscuring the left hemidiaphragm, descending thoracic aorta and blunting the left lateral costophrenic angle, suggesting combination of left lung atelectasis and/or consolidation with pleural effusion. Bilateral lung fields are clear. No pneumothorax. Stable cardio-mediastinal silhouette. No acute osseous abnormalities. The soft tissues are within normal limits. IMPRESSION: Small right pleural effusion probable bilateral small pleural effusions with associated compressive atelectatic changes and/or consolidation. Correlate clinically. Electronically Signed   By: Jules Schick M.D.   On: 10/29/2023 15:27     TODAY-DAY OF DISCHARGE:  Subjective:   [name] today remains left-blood pressure remains in the 80s systolic this morning.  Objective:   Blood pressure (!) 97/56, pulse [name])  116, temperature 98.5 F (36.9 C), temperature source Oral, resp. rate 20, height 6' (1.829 m), weight 102.7 kg, SpO2 95%.  Intake/Output Summary (Last 24 hours) at 11/08/2023 1432 Last data filed at 11/08/2023 0900 Gross per 24 hour  Intake 120 ml  Output 400 ml  Net -280 ml   Filed Weights   10/31/23 0447 11/03/23 0500 11/05/23 0500  Weight: 90.3 kg 91 kg 102.7 kg    Exam: Lethargic, No new F.N deficits [place].AT,PERRAL Supple Neck,No JVD, No cervical lymphadenopathy appriciated.  Symmetrical Chest wall movement, Good air movement bilaterally, CTAB RRR,No Gallops,Rubs or new Murmurs, No Parasternal Heave +ve B.Sounds, Abd Soft, Non tender, No organomegaly appriciated, No rebound -guarding or rigidity. No Cyanosis, Clubbing or edema, No new Rash or bruise   PERTINENT RADIOLOGIC STUDIES: No results  found.    PERTINENT LAB RESULTS: CBC: No results for input(s): "WBC", "HGB", "HCT", "PLT" in the last 72 hours.  CMET CMP     Component Value Date/Time   NA 133 (L) 11/05/2023 0427   K 4.6 11/05/2023 0427   CL 101 11/05/2023 0427   CO2 23 11/05/2023 0427   GLUCOSE 128 (H) 11/05/2023 0427   BUN 37 (H) 11/05/2023 0427   CREATININE 0.85 11/05/2023 0427   CREATININE 0.80 09/01/2023 1042   CALCIUM 8.2 (L) 11/05/2023 0427   PROT 5.4 (L) 10/29/2023 1925   ALBUMIN 2.1 (L) 10/29/2023 1457   AST 18 10/29/2023 1457   ALT 10 10/29/2023 1457   ALKPHOS 99 10/29/2023 1457   BILITOT 1.3 (H) 10/29/2023 1457   EGFR 98 09/01/2023 1042   GFRNONAA >60 11/05/2023 0427    GFR Estimated Creatinine Clearance: 105.9 mL/min (by C-G formula based on SCr of 0.85 mg/dL). No results for input(s): "LIPASE", "AMYLASE" in the last 72 hours. No results for input(s): "CKTOTAL", "CKMB", "CKMBINDEX", "TROPONINI" in the last 72 hours. Invalid input(s): "POCBNP" No results for input(s): "DDIMER" in the last 72 hours. No results for input(s): "HGBA1C" in the last 72 hours. No results for input(s): "CHOL", "HDL", "LDLCALC", "TRIG", "CHOLHDL", "LDLDIRECT" in the last 72 hours. No results for input(s): "TSH", "T4TOTAL", "T3FREE", "THYROIDAB" in the last 72 hours.  Invalid input(s): "FREET3" No results for input(s): "VITAMINB12", "FOLATE", "FERRITIN", "TIBC", "IRON", "RETICCTPCT" in the last 72 hours. Coags: No results for input(s): "INR" in the last 72 hours.  Invalid input(s): "PT" Microbiology: Recent Results (from the past 240 hours)  Blood Culture (routine x 2)     Status: None   Collection Time: 10/29/23  2:57 PM   Specimen: BLOOD LEFT ARM  Result Value Ref Range Status   Specimen Description BLOOD LEFT ARM  Final   Special Requests   Final    BOTTLES DRAWN AEROBIC AND ANAEROBIC Blood Culture results may not be optimal due to an inadequate volume of blood received in culture bottles   Culture    Final    NO GROWTH 5 DAYS Performed at [facility], [address] [place], [place] [address]    Report Status 11/03/2023 FINAL  Final  Respiratory (~20 pathogens) panel by PCR     Status: None   Collection Time: 10/29/23  5:31 PM   Specimen: Pleural; Respiratory  Result Value Ref Range Status   Adenovirus NOT DETECTED NOT DETECTED Final   Coronavirus 229E NOT DETECTED NOT DETECTED Final    Comment: (NOTE) The Coronavirus on the Respiratory Panel, DOES NOT test for the novel  Coronavirus (2019 nCoV)    Coronavirus HKU1 NOT  DETECTED NOT DETECTED Final   Coronavirus NL63 NOT DETECTED NOT DETECTED Final   Coronavirus OC43 NOT DETECTED NOT DETECTED Final   Metapneumovirus NOT DETECTED NOT DETECTED Final   Rhinovirus / Enterovirus NOT DETECTED NOT DETECTED Final   Influenza A NOT DETECTED NOT DETECTED Final   Influenza B NOT DETECTED NOT DETECTED Final   Parainfluenza Virus 1 NOT DETECTED NOT DETECTED Final   Parainfluenza Virus 2 NOT DETECTED NOT DETECTED Final   Parainfluenza Virus 3 NOT DETECTED NOT DETECTED Final   Parainfluenza Virus 4 NOT DETECTED NOT DETECTED Final   Respiratory Syncytial Virus NOT DETECTED NOT DETECTED Final   Bordetella pertussis NOT DETECTED NOT DETECTED Final   Bordetella Parapertussis NOT DETECTED NOT DETECTED Final   Chlamydophila pneumoniae NOT DETECTED NOT DETECTED Final   Mycoplasma pneumoniae NOT DETECTED NOT DETECTED Final    Comment: Performed at [facility], [address], [place], [place] [address]  Body fluid culture w Gram Stain     Status: None   Collection Time: 10/29/23  7:10 PM   Specimen: Pleural Fluid  Result Value Ref Range Status   Specimen Description PLEURAL  Final   Special Requests NONE  Final   Gram Stain   Final    WBC PRESENT,BOTH PMN AND MONONUCLEAR NO ORGANISMS SEEN CYTOSPIN SMEAR    Culture   Final    NO GROWTH 3 DAYS Performed at [facility], [address] [place], [place] [address]    Report  Status 11/02/2023 FINAL  Final  MRSA Next Gen by PCR, Nasal     Status: Abnormal   Collection Time: 10/29/23  7:30 PM  Result Value Ref Range Status   MRSA by PCR Next Gen DETECTED (A) NOT DETECTED Final    Comment: RESULT CALLED TO, READ BACK BY AND VERIFIED WITH:  W. HUTCHES 10/29/2023 @ 2217 BY AB (NOTE) The GeneXpert MRSA Assay (FDA approved for NASAL specimens only), is one component of a comprehensive MRSA colonization surveillance program. It is not intended to diagnose MRSA infection nor to guide or monitor treatment for MRSA infections. Test performance is not FDA approved in patients less than 40 years old. Performed at [facility], [address] [place], [place] [address]     FURTHER DISCHARGE INSTRUCTIONS:  Get Medicines reviewed and adjusted: Please take all your medications with you for your next visit with your Primary MD  Laboratory/radiological data: Please request your Primary MD to go over all hospital tests and procedure/radiological results at the follow up, please ask your Primary MD to get all Hospital records sent to his/her office.  In some cases, they will be blood work, cultures and biopsy results pending at the time of your discharge. Please request that your primary care M.D. goes through all the records of your hospital data and follows up on these results.  Also Note the following: If you experience worsening of your admission symptoms, develop shortness of breath, life threatening emergency, suicidal or homicidal thoughts you must seek medical attention immediately by calling 911 or calling your MD immediately  if symptoms less severe.  You must read complete instructions/literature along with all the possible adverse reactions/side effects for all the Medicines you take and that have been prescribed to you. Take any new Medicines after you have completely understood and accpet all the possible adverse reactions/side effects.   Do not drive when  taking Pain medications or sleeping medications (Benzodaizepines)  Do not take more than  prescribed Pain, Sleep and Anxiety Medications. It is not advisable to combine anxiety,sleep and pain medications without talking with your primary care practitioner  Special Instructions: If you have smoked or chewed Tobacco  in the last 2 yrs please stop smoking, stop any regular Alcohol  and or any Recreational drug use.  Wear Seat belts while driving.  Please note: You were cared for by a hospitalist during your hospital stay. Once you are discharged, your primary care physician will handle any further medical issues. Please note that NO REFILLS for any discharge medications will be authorized once you are discharged, as it is imperative that you return to your primary care physician (or establish a relationship with a primary care physician if you do not have one) for your post hospital discharge needs so that they can reassess your need for medications and monitor your lab values.  Total Time spent coordinating discharge including counseling, education and face to face time equals greater than 30 minutes.  SignedJeoffrey Massed 11/08/2023 2:32 PM

## 2023-11-07 DIAGNOSIS — B961 Klebsiella pneumoniae [K. pneumoniae] as the cause of diseases classified elsewhere: Secondary | ICD-10-CM | POA: Diagnosis not present

## 2023-11-07 DIAGNOSIS — R7881 Bacteremia: Secondary | ICD-10-CM | POA: Diagnosis not present

## 2023-11-07 NOTE — Progress Notes (Signed)
PROGRESS NOTE        PATIENT DETAILS Name: Peter Yang Age: 68 y.o. Sex: male Date of Birth: 03/23/1956 Admit Date: 10/29/2023 Admitting Physician Steffanie Dunn, DO ZOX:[id number], No Pcp Per  Brief Summary: Patient is a 68 y.o.  male with history of left BKA, history of Fournier's gangrene, chronic HFrEF, adrenal insufficiency, PAF,, chronic indwelling Foley catheter, chronic pain syndrome-who was admitted to the ICU on 1/8 for septic shock in the setting of ESBL bacteremia.  Significant events: 1/8>> admit to ICU-septic shock-ESBL bacteremia 1/11>> transferred to [facility] 1/14>> encephalopathic/hypotensive-stress dose steroids/midodrine/IVF/IV albumin-Dr. Elgergawy discussed with POA-no further escalation in care.  Monitor for 1-2 days-if no improvement-plan for hospice. 1/15>> transition to full comfort measures   Significant studies: 1/8>> CT head: No acute intracranial malady 1/8>> CT abdomen/pelvis: Mild bilateral hydronephrosis-moderate to large bilateral pleural effusions 1/9>> renal ultrasound: No hydronephrosis-improved from prior CT.  Significant microbiology data: 1/8>> COVID/influenza/RSV PCR: Negative 1/8 >>respiratory virus panel: Negative 1/8>> blood culture: ESBL Klebsiella pneumoniae 1/8>> pleural fluid: No growth  Procedures: 1/8>> thoracocentesis-transudative  Consults: None  Subjective: Awake-lethargic-some oral intake but appears very frail-SBP in the 80s systolic this morning.  Awaiting hospice bed.  Objective: Vitals: Blood pressure (!) 91/55, pulse 89, temperature 98.1 F (36.7 C), temperature source Axillary, resp. rate 20, height 6' (1.829 m), weight 102.7 kg, SpO2 98%.   Exam: Not in any distress-awake/alert.  Pertinent Labs/Radiology:    Latest Ref Rng & Units 11/05/2023    4:27 AM 11/04/2023    4:53 PM 11/04/2023    4:46 AM  CBC  WBC 4.0 - 10.5 K/uL 12.7  16.1  18.8   Hemoglobin 13.0 - 17.0 g/dL 7.6  8.2  7.6    Hematocrit 39.0 - 52.0 % 22.8  24.2  22.3   Platelets 150 - 400 K/uL 149  167  209     Lab Results  Component Value Date   NA 133 (L) 11/05/2023   K 4.6 11/05/2023   CL 101 11/05/2023   CO2 23 11/05/2023     Assessment/Plan: Septic shock secondary to ESBL Klebsiella pneumoniae bacteremia Initially required ICU admission/pressors-and had improved-however on 1/14-developed encephalopathy/hypotension.  He was maintained on meropenem-was briefly given stress dose steroids/midodrine/IV albumin-goals of care conversation was continued with family-and subsequently transition to full comfort measures on 1/15.  Awaiting residential hospice.-Remains hypotensive this morning-but medically stable for transfer to residential hospice.  Overall prognosis is poor-life expectancy is suspected to be a few days.     Acute metabolic encephalopathy Secondary to above-lethargic-sleepy Supportive care comfort care in progress.   Normocytic anemia Likely due to acute illness-no indication of blood loss Has required 2 units of PRBC-last transfused on 1/14 No plans to follow CBC-last hemoglobin 7.6 on 1/15.  Comfort care.   Pleural effusion Transudative Cultures negative   Chronic hypotension Blood pressure remains soft-renal function stable No longer on midodrine/albumin/steroids.   AKI Resolved   PAF Telemetry monitoring Eliquis was held due to severity of anemia-no plans to resume-comfort care in progress.   Adrenal insufficiency On chronic steroids-maintained on steroids here-and then given IV hydrocortisone due to recurrent hypotension in 1/14-currently off all steroids-comfort care in progress.    Chronic HFrEF (EF 35% by echo on June 2024) Some lower extremity edema but lying flat Stable-no plans to resume diuretics at this point.   DM-2  Managed with SSI   Chronic pain syndrome Supportive care   History of Fournier's gangrene History of left BKA Long-term SNF resident   Chronic  indwelling Foley catheter   Palliative care DNR in place Prior MD had a long discussion with patient's POA-[name]-no ICU transfer-no pressors-no further escalation in care.  Subsequently palliative care prolonged discussion with family on 1/15-since patient did not significantly improved-remained hypotensive/lethargic-transition to full comfort measures-awaiting residential hospice bed.  Poor quality of life-very poor overall prognosis.     Nutrition Status: Nutrition Problem: Severe Malnutrition Etiology: chronic illness Signs/Symptoms: severe fat depletion, severe muscle depletion, energy intake < or equal to 75% for > or equal to 1 month Interventions: Ensure Enlive (each supplement provides 350kcal and 20 grams of protein), Liberalize Diet  Pressure Ulcer: Agree with assessment and plan as outlined below. Pressure Injury 10/29/23 Heel Right Deep Tissue Pressure Injury - Purple or maroon localized area of discolored intact skin or blood-filled blister due to damage of underlying soft tissue from pressure and/or shear. 3.5x2.5 purple (Active)  10/29/23 2000  Location: Heel  Location Orientation: Right  Staging: Deep Tissue Pressure Injury - Purple or maroon localized area of discolored intact skin or blood-filled blister due to damage of underlying soft tissue from pressure and/or shear.  Wound Description (Comments): 3.5x2.5 purple  Present on Admission: Yes  Dressing Type Foam - Lift dressing to assess site every shift 11/06/23 1936     Pressure Injury 10/30/23 Sacrum Medial Unstageable - Full thickness tissue loss in which the base of the injury is covered by slough (yellow, tan, gray, green or brown) and/or eschar (tan, brown or black) in the wound bed. 7 cm x 6 cm 100% black tan necr (Active)  10/30/23 1002  Location: Sacrum  Location Orientation: Medial  Staging: Unstageable - Full thickness tissue loss in which the base of the injury is covered by slough (yellow, tan, gray,  green or brown) and/or eschar (tan, brown or black) in the wound bed.  Wound Description (Comments): 7 cm x 6 cm 100% black tan necrotic tissue  Present on Admission: Yes  Dressing Type Foam - Lift dressing to assess site every shift 11/06/23 0800   Class 1 Obesity: Estimated body mass index is 30.71 kg/m as calculated from the following:   Height as of this encounter: 6' (1.829 m).   Weight as of this encounter: 102.7 kg.    The patient is critically ill with multiple organ system failure and requires high complexity decision making for assessment and support, frequent evaluation and titration of therapies, advanced monitoring, review of radiographic studies and interpretation of complex data.   Total Critical time spent equals 45 minutes  Code status:   Code Status: Do not attempt resuscitation (DNR) - Comfort care   DVT Prophylaxis:    Family Communication: Left voicemail for her friend/POA-[name] Smith-[phone] on 1/15.   Disposition Plan: Status is: Inpatient Remains inpatient appropriate because: Severity of illness   Planned Discharge Destination:Skilled nursing facility   Diet: Diet Order             Diet - low sodium heart healthy           Diet regular Room service appropriate? Yes; Fluid consistency: Thin  Diet effective now                     Antimicrobial agents: Anti-infectives (From admission, onward)    Start     Dose/Rate Route Frequency Ordered Stop  10/31/23 0600  meropenem (MERREM) 1 g in sodium chloride 0.9 % 100 mL IVPB  Status:  Discontinued        1 g 200 mL/hr over 30 Minutes Intravenous Every 8 hours 10/30/23 2252 11/05/23 1255   10/29/23 2300  metroNIDAZOLE (FLAGYL) IVPB 500 mg  Status:  Discontinued        500 mg 100 mL/hr over 60 Minutes Intravenous Every 8 hours 10/29/23 1737 10/30/23 0958   10/29/23 1745  ceftazidime-avibactam (AVYCAZ) 1.25 g in dextrose 5 % 50 mL IVPB  Status:  Discontinued        1.25 g 25 mL/hr over 2  Hours Intravenous Every 8 hours 10/29/23 1737 10/30/23 2243   10/29/23 1645  doxycycline (VIBRAMYCIN) 100 mg in dextrose 5 % 250 mL IVPB  Status:  Discontinued        100 mg 125 mL/hr over 120 Minutes Intravenous Every 12 hours 10/29/23 1642 10/30/23 0957   10/29/23 1430  vancomycin (VANCOREADY) IVPB 1750 mg/350 mL        1,750 mg 175 mL/hr over 120 Minutes Intravenous  Once 10/29/23 1423 10/29/23 1827   10/29/23 1415  ceFEPIme (MAXIPIME) 2 g in sodium chloride 0.9 % 100 mL IVPB        2 g 200 mL/hr over 30 Minutes Intravenous  Once 10/29/23 1409 10/29/23 1552   10/29/23 1415  metroNIDAZOLE (FLAGYL) IVPB 500 mg        500 mg 100 mL/hr over 60 Minutes Intravenous  Once 10/29/23 1409 10/29/23 1645   10/29/23 1415  vancomycin (VANCOCIN) IVPB 1000 mg/200 mL premix  Status:  Discontinued        1,000 mg 200 mL/hr over 60 Minutes Intravenous  Once 10/29/23 1409 10/29/23 1423        MEDICATIONS: Scheduled Meds:  antiseptic oral rinse  15 mL Topical BID   collagenase   Topical Daily   liver oil-zinc oxide   Topical BID   Continuous Infusions:   PRN Meds:.acetaminophen **OR** acetaminophen, diphenhydrAMINE, feeding supplement, glycopyrrolate **OR** glycopyrrolate **OR** glycopyrrolate, haloperidol **OR** haloperidol **OR** haloperidol lactate, LORazepam **OR** LORazepam **OR** LORazepam, morphine injection, ondansetron **OR** ondansetron (ZOFRAN) IV, oxyCODONE, polyethylene glycol, polyvinyl alcohol   I have personally reviewed following labs and imaging studies  LABORATORY DATA: CBC: Recent Labs  Lab 11/02/23 0241 11/03/23 0425 11/04/23 0446 11/04/23 1653 11/05/23 0427  WBC 23.7* 17.7* 18.8* 16.1* 12.7*  HGB 9.5* 7.3* 7.6* 8.2* 7.6*  HCT 28.5* 21.2* 22.3* 24.2* 22.8*  MCV 86.9 86.5 85.1 84.9 86.4  PLT 300 211 209 167 149*    Basic Metabolic Panel: Recent Labs  Lab 11/01/23 0507 11/02/23 0241 11/03/23 0425 11/04/23 0446 11/04/23 0449 11/05/23 0427  NA 127* 130* 126*   --  131* 133*  K 4.4 3.8 4.1  --  4.2 4.6  CL 97* 96* 95*  --  99 101  CO2 25 24 23   --  23 23  GLUCOSE 177* 95 114*  --  125* 128*  BUN 46* 42* 39*  --  40* 37*  CREATININE 1.01 0.76 0.73  --  0.99 0.85  CALCIUM 7.7* 8.1* 7.9*  --  8.0* 8.2*  MG 2.4 2.5* 2.2 2.6*  --  3.0*  PHOS 1.6* 2.4* 2.1* 2.7  --  3.0    GFR: Estimated Creatinine Clearance: 105.9 mL/min (by C-G formula based on SCr of 0.85 mg/dL).  Liver Function Tests: No results for input(s): "AST", "ALT", "ALKPHOS", "BILITOT", "PROT", "ALBUMIN" in the last 168 hours.  No results for input(s): "LIPASE", "AMYLASE" in the last 168 hours.  No results for input(s): "AMMONIA" in the last 168 hours.  Coagulation Profile: No results for input(s): "INR", "PROTIME" in the last 168 hours.   Cardiac Enzymes: No results for input(s): "CKTOTAL", "CKMB", "CKMBINDEX", "TROPONINI" in the last 168 hours.  BNP (last 3 results) No results for input(s): "PROBNP" in the last 8760 hours.  Lipid Profile: No results for input(s): "CHOL", "HDL", "LDLCALC", "TRIG", "CHOLHDL", "LDLDIRECT" in the last 72 hours.  Thyroid Function Tests: No results for input(s): "TSH", "T4TOTAL", "FREET4", "T3FREE", "THYROIDAB" in the last 72 hours.  Anemia Panel: No results for input(s): "VITAMINB12", "FOLATE", "FERRITIN", "TIBC", "IRON", "RETICCTPCT" in the last 72 hours.  Urine analysis:    Component Value Date/Time   COLORURINE RED (A) 10/29/2023 1930   APPEARANCEUR TURBID (A) 10/29/2023 1930   LABSPEC  10/29/2023 1930    TEST NOT REPORTED DUE TO COLOR INTERFERENCE OF URINE PIGMENT   PHURINE  10/29/2023 1930    TEST NOT REPORTED DUE TO COLOR INTERFERENCE OF URINE PIGMENT   GLUCOSEU (A) 10/29/2023 1930    TEST NOT REPORTED DUE TO COLOR INTERFERENCE OF URINE PIGMENT   HGBUR (A) 10/29/2023 1930    TEST NOT REPORTED DUE TO COLOR INTERFERENCE OF URINE PIGMENT   BILIRUBINUR (A) 10/29/2023 1930    TEST NOT REPORTED DUE TO COLOR INTERFERENCE OF URINE  PIGMENT   KETONESUR (A) 10/29/2023 1930    TEST NOT REPORTED DUE TO COLOR INTERFERENCE OF URINE PIGMENT   PROTEINUR (A) 10/29/2023 1930    TEST NOT REPORTED DUE TO COLOR INTERFERENCE OF URINE PIGMENT   NITRITE (A) 10/29/2023 1930    TEST NOT REPORTED DUE TO COLOR INTERFERENCE OF URINE PIGMENT   LEUKOCYTESUR (A) 10/29/2023 1930    TEST NOT REPORTED DUE TO COLOR INTERFERENCE OF URINE PIGMENT    Sepsis Labs: Lactic Acid, Venous    Component Value Date/Time   LATICACIDVEN 1.5 11/04/2023 0753    MICROBIOLOGY: Recent Results (from the past 240 hours)  Resp panel by RT-PCR (RSV, Flu A&B, Covid) Anterior Nasal Swab     Status: None   Collection Time: 10/29/23  2:09 PM   Specimen: Anterior Nasal Swab  Result Value Ref Range Status   SARS Coronavirus 2 by RT PCR NEGATIVE NEGATIVE Final   Influenza A by PCR NEGATIVE NEGATIVE Final   Influenza B by PCR NEGATIVE NEGATIVE Final    Comment: (NOTE) The Xpert Xpress SARS-CoV-2/FLU/RSV plus assay is intended as an aid in the diagnosis of influenza from Nasopharyngeal swab specimens and should not be used as a sole basis for treatment. Nasal washings and aspirates are unacceptable for Xpert Xpress SARS-CoV-2/FLU/RSV testing.  Fact Sheet for Patients: BloggerCourse.com  Fact Sheet for Healthcare Providers: SeriousBroker.it  This test is not yet approved or cleared by the [place] FDA and has been authorized for detection and/or diagnosis of SARS-CoV-2 by FDA under an Emergency Use Authorization (EUA). This EUA will remain in effect (meaning this test can be used) for the duration of the COVID-19 declaration under Section 564(b)(1) of the Act, 21 U.S.C. section 360bbb-3(b)(1), unless the authorization is terminated or revoked.     Resp Syncytial Virus by PCR NEGATIVE NEGATIVE Final    Comment: (NOTE) Fact Sheet for Patients: BloggerCourse.com  Fact Sheet  for Healthcare Providers: SeriousBroker.it  This test is not yet approved or cleared by the [place] FDA and has been authorized for detection and/or diagnosis of SARS-CoV-2 by FDA under  an Emergency Use Authorization (EUA). This EUA will remain in effect (meaning this test can be used) for the duration of the COVID-19 declaration under Section 564(b)(1) of the Act, 21 U.S.C. section 360bbb-3(b)(1), unless the authorization is terminated or revoked.  Performed at [facility], [address], [place], [place] [address]   Blood Culture (routine x 2)     Status: Abnormal   Collection Time: 10/29/23  2:14 PM   Specimen: BLOOD LEFT ARM  Result Value Ref Range Status   Specimen Description BLOOD LEFT ARM  Final   Special Requests   Final    BOTTLES DRAWN AEROBIC AND ANAEROBIC Blood Culture adequate volume   Culture  Setup Time   Final    GRAM NEGATIVE RODS IN BOTH AEROBIC AND ANAEROBIC BOTTLES CRITICAL RESULT CALLED TO, READ BACK BY AND VERIFIED WITH: PHARMD ANDREW MEYER ON 10/30/23 @ 1608 BY DRT Performed at [facility], [address], [place], [place] [address]    Culture (A)  Final    KLEBSIELLA PNEUMONIAE Confirmed Extended Spectrum Beta-Lactamase Producer (ESBL).  In bloodstream infections from ESBL organisms, carbapenems are preferred over piperacillin/tazobactam. They are shown to have a lower risk of mortality.    Report Status 11/01/2023 FINAL  Final   Organism ID, Bacteria KLEBSIELLA PNEUMONIAE  Final      Susceptibility   Klebsiella pneumoniae - MIC*    AMPICILLIN >=32 RESISTANT Resistant     CEFEPIME >=32 RESISTANT Resistant     CEFTAZIDIME >=64 RESISTANT Resistant     CEFTRIAXONE >=64 RESISTANT Resistant     CIPROFLOXACIN >=4 RESISTANT Resistant     GENTAMICIN >=16 RESISTANT Resistant     IMIPENEM 1 SENSITIVE Sensitive     TRIMETH/SULFA >=320 RESISTANT Resistant     AMPICILLIN/SULBACTAM >=32 RESISTANT Resistant     PIP/TAZO  64 INTERMEDIATE Intermediate ug/mL    * KLEBSIELLA PNEUMONIAE  Blood Culture ID Panel (Reflexed)     Status: Abnormal   Collection Time: 10/29/23  2:14 PM  Result Value Ref Range Status   Enterococcus faecalis NOT DETECTED NOT DETECTED Final   Enterococcus Faecium NOT DETECTED NOT DETECTED Final   Listeria monocytogenes NOT DETECTED NOT DETECTED Final   Staphylococcus species NOT DETECTED NOT DETECTED Final   Staphylococcus aureus (BCID) NOT DETECTED NOT DETECTED Final   Staphylococcus epidermidis NOT DETECTED NOT DETECTED Final   Staphylococcus lugdunensis NOT DETECTED NOT DETECTED Final   Streptococcus species NOT DETECTED NOT DETECTED Final   Streptococcus agalactiae NOT DETECTED NOT DETECTED Final   Streptococcus pneumoniae NOT DETECTED NOT DETECTED Final   Streptococcus pyogenes NOT DETECTED NOT DETECTED Final   A.calcoaceticus-baumannii NOT DETECTED NOT DETECTED Final   Bacteroides fragilis NOT DETECTED NOT DETECTED Final   Enterobacterales DETECTED (A) NOT DETECTED Final    Comment: Enterobacterales represent a large order of gram negative bacteria, not a single organism. CRITICAL RESULT CALLED TO, READ BACK BY AND VERIFIED WITH: PHARMD ANDREW MEYER ON 10/30/23 @ 1608 BY DRT    Enterobacter cloacae complex NOT DETECTED NOT DETECTED Final   Escherichia coli NOT DETECTED NOT DETECTED Final   Klebsiella aerogenes NOT DETECTED NOT DETECTED Final   Klebsiella oxytoca NOT DETECTED NOT DETECTED Final   Klebsiella pneumoniae DETECTED (A) NOT DETECTED Final    Comment: CRITICAL RESULT CALLED TO, READ BACK BY AND VERIFIED WITH: PHARMD ANDREW MEYER ON 10/30/23 @ 1608 BY DRT    Proteus species NOT DETECTED NOT DETECTED Final   Salmonella species NOT DETECTED NOT  DETECTED Final   Serratia marcescens NOT DETECTED NOT DETECTED Final   Haemophilus influenzae NOT DETECTED NOT DETECTED Final   Neisseria meningitidis NOT DETECTED NOT DETECTED Final   Pseudomonas aeruginosa NOT DETECTED NOT  DETECTED Final   Stenotrophomonas maltophilia NOT DETECTED NOT DETECTED Final   Candida albicans NOT DETECTED NOT DETECTED Final   Candida auris NOT DETECTED NOT DETECTED Final   Candida glabrata NOT DETECTED NOT DETECTED Final   Candida krusei NOT DETECTED NOT DETECTED Final   Candida parapsilosis NOT DETECTED NOT DETECTED Final   Candida tropicalis NOT DETECTED NOT DETECTED Final   Cryptococcus neoformans/gattii NOT DETECTED NOT DETECTED Final   CTX-M ESBL DETECTED (A) NOT DETECTED Final    Comment: CRITICAL RESULT CALLED TO, READ BACK BY AND VERIFIED WITH: PHARMD ANDREW MEYER ON 10/30/23 @ 1608 BY DRT (NOTE) Extended spectrum beta-lactamase detected. Recommend a carbapenem as initial therapy.      Carbapenem resistance IMP NOT DETECTED NOT DETECTED Final   Carbapenem resistance KPC NOT DETECTED NOT DETECTED Final   Carbapenem resistance NDM NOT DETECTED NOT DETECTED Final   Carbapenem resist OXA 48 LIKE NOT DETECTED NOT DETECTED Final   Carbapenem resistance VIM NOT DETECTED NOT DETECTED Final    Comment: Performed at [facility], [address], [place], [place] [address]  Blood Culture (routine x 2)     Status: None   Collection Time: 10/29/23  2:57 PM   Specimen: BLOOD LEFT ARM  Result Value Ref Range Status   Specimen Description BLOOD LEFT ARM  Final   Special Requests   Final    BOTTLES DRAWN AEROBIC AND ANAEROBIC Blood Culture results may not be optimal due to an inadequate volume of blood received in culture bottles   Culture   Final    NO GROWTH 5 DAYS Performed at [facility], [address] [place], [place] [address]    Report Status 11/03/2023 FINAL  Final  Respiratory (~20 pathogens) panel by PCR     Status: None   Collection Time: 10/29/23  5:31 PM   Specimen: Pleural; Respiratory  Result Value Ref Range Status   Adenovirus NOT DETECTED NOT DETECTED Final   Coronavirus 229E NOT DETECTED NOT DETECTED Final    Comment: (NOTE) The Coronavirus on  the Respiratory Panel, DOES NOT test for the novel  Coronavirus (2019 nCoV)    Coronavirus HKU1 NOT DETECTED NOT DETECTED Final   Coronavirus NL63 NOT DETECTED NOT DETECTED Final   Coronavirus OC43 NOT DETECTED NOT DETECTED Final   Metapneumovirus NOT DETECTED NOT DETECTED Final   Rhinovirus / Enterovirus NOT DETECTED NOT DETECTED Final   Influenza A NOT DETECTED NOT DETECTED Final   Influenza B NOT DETECTED NOT DETECTED Final   Parainfluenza Virus 1 NOT DETECTED NOT DETECTED Final   Parainfluenza Virus 2 NOT DETECTED NOT DETECTED Final   Parainfluenza Virus 3 NOT DETECTED NOT DETECTED Final   Parainfluenza Virus 4 NOT DETECTED NOT DETECTED Final   Respiratory Syncytial Virus NOT DETECTED NOT DETECTED Final   Bordetella pertussis NOT DETECTED NOT DETECTED Final   Bordetella Parapertussis NOT DETECTED NOT DETECTED Final   Chlamydophila pneumoniae NOT DETECTED NOT DETECTED Final   Mycoplasma pneumoniae NOT DETECTED NOT DETECTED Final    Comment: Performed at [facility], [address], [place], [place] [address]  Body fluid culture w Gram Stain     Status: None   Collection Time: 10/29/23  7:10 PM   Specimen: Pleural Fluid  Result Value Ref Range Status   Specimen Description PLEURAL  Final   Special Requests NONE  Final   Gram Stain   Final    WBC PRESENT,BOTH PMN AND MONONUCLEAR NO ORGANISMS SEEN CYTOSPIN SMEAR    Culture   Final    NO GROWTH 3 DAYS Performed at [facility], [address] [place], [place] [address]    Report Status 11/02/2023 FINAL  Final  MRSA Next Gen by PCR, Nasal     Status: Abnormal   Collection Time: 10/29/23  7:30 PM  Result Value Ref Range Status   MRSA by PCR Next Gen DETECTED (A) NOT DETECTED Final    Comment: RESULT CALLED TO, READ BACK BY AND VERIFIED WITH:  W. HUTCHES 10/29/2023 @ 2217 BY AB (NOTE) The GeneXpert MRSA Assay (FDA approved for NASAL specimens only), is one component of a comprehensive MRSA colonization  surveillance program. It is not intended to diagnose MRSA infection nor to guide or monitor treatment for MRSA infections. Test performance is not FDA approved in patients less than 50 years old. Performed at [facility], [address], [place], [place] [address]     RADIOLOGY STUDIES/RESULTS: No results found.    LOS: 9 days   Jeoffrey Massed, MD  [facility]    To contact the attending provider between 7A-7P or the covering provider during after hours 7P-7A, please log into the web site [email or web address] and access using universal [place] password for that web site. If you do not have the password, please call the hospital operator.  11/07/2023, 12:49 PM

## 2023-11-07 NOTE — TOC Progression Note (Signed)
Transition of Care [facility]) - Progression Note    Patient Details  Name: Peter Yang MRN: [id number] Date of Birth: 10/14/1956  Transition of Care [facility]) CM/SW Contact  Mearl Latin, LCSW Phone Number: 11/07/2023, 10:26 AM  Clinical Narrative:    [name] still awaiting a bed. Patient's contact is requesting [facility] and declined a different facility location.    Expected Discharge Plan: Skilled Nursing Facility Barriers to Discharge: Continued Medical Work up  Expected Discharge Plan and Services   Discharge Planning Services: CM Consult Post Acute Care Choice: Skilled Nursing Facility                                         Social Determinants of Health (SDOH) Interventions SDOH Screenings   Food Insecurity: No Food Insecurity (10/30/2023)  Housing: Low Risk  (10/30/2023)  Transportation Needs: No Transportation Needs (10/30/2023)  Utilities: Not At Risk (10/30/2023)  Depression (PHQ2-9): Low Risk  (09/01/2023)  Financial Resource Strain: Low Risk  (08/13/2018)   Received from [facility] visits prior to 12/21/2022., [facility] visits prior to 12/21/2022.  Social Connections: Socially Isolated (10/30/2023)  Tobacco Use: Low Risk  (10/30/2023)    Readmission Risk Interventions    05/07/2023    4:42 PM 10/24/2022   11:21 AM 09/02/2022   10:36 AM  Readmission Risk Prevention Plan  Transportation Screening Complete Complete Complete  Medication Review Oceanographer)  Complete Complete  PCP or Specialist appointment within 3-5 days of discharge Complete Complete Complete  HRI or Home Care Consult Complete Complete Complete  SW Recovery Care/Counseling Consult Complete Complete Complete  Palliative Care Screening Not Applicable Not Applicable Not Applicable  Skilled Nursing Facility Complete Complete Complete

## 2023-11-07 NOTE — Progress Notes (Signed)
Daily Progress Note   Patient Name: Peter Yang       Date: 11/07/2023 DOB: 1955/12/02  Age: 68 y.o. MRN#: [id number] Attending Physician: Maretta Bees, MD Primary Care Physician: Patient, No Pcp Per Admit Date: 10/29/2023  Reason for Consultation/Follow-up: Non pain symptom management, Pain control, Psychosocial/spiritual support, and Terminal Care  Subjective: Denies pain - fixated on getting a coke - provided coke and appeared to have difficulty swallowing - he reports it did not go down well. Wants to sleep, does not want to discuss situation. I tell him the plan for hospice facility and he tells "that sounds fine". I ask him if he has any questions or concerns but he denies. Agrees for me to update his friend [name].     I have reviewed medical records including EPIC notes, MAR, any available advanced directives as necessary, and labs. Received report from primary RN - no acute concerns.  Went to visit patient at bedside - no family/visitors present. Patient was lying in bed - he wakes to voice but quickly falls back to sleep when not being stimulated.  No signs or non-verbal gestures of pain or discomfort noted. No respiratory distress, increased work of breathing, or secretions noted.   Patient has been accepted to [facility] - waiting for available bed for transfer. He does appear stable for transfer at this time.  Called [name] - reviewed the above. All questions and concerns addressed.   Length of Stay: 9  Current Medications: Scheduled Meds:   antiseptic oral rinse  15 mL Topical BID   collagenase   Topical Daily   liver oil-zinc oxide   Topical BID    Continuous Infusions:   PRN Meds: acetaminophen **OR** acetaminophen, diphenhydrAMINE, feeding supplement,  glycopyrrolate **OR** glycopyrrolate **OR** glycopyrrolate, haloperidol **OR** haloperidol **OR** haloperidol lactate, LORazepam **OR** LORazepam **OR** LORazepam, morphine injection, ondansetron **OR** ondansetron (ZOFRAN) IV, oxyCODONE, polyethylene glycol, polyvinyl alcohol  Physical Exam Vitals and nursing note reviewed.  Constitutional:      General: He is not in acute distress.    Appearance: He is ill-appearing.  Pulmonary:     Effort: No respiratory distress.  Skin:    General: Skin is warm and dry.  Neurological:     Mental Status: He is lethargic.  Vital Signs: BP (!) 91/55 (BP Location: Right Arm)   Pulse 89   Temp 98.1 F (36.7 C) (Axillary)   Resp 20   Ht 6' (1.829 m)   Wt 102.7 kg   SpO2 98%   BMI 30.71 kg/m  SpO2: SpO2: 98 % O2 Device: O2 Device: Room Air O2 Flow Rate:    Intake/output summary:  Intake/Output Summary (Last 24 hours) at 11/07/2023 1046 Last data filed at 11/07/2023 0351 Gross per 24 hour  Intake --  Output 700 ml  Net -700 ml   LBM: Last BM Date : 11/05/23 Baseline Weight: Weight: 79.4 kg Most recent weight: Weight: 102.7 kg       Palliative Assessment/Data: PPS 20%      Patient Active Problem List   Diagnosis Date Noted   ESBL (extended spectrum beta-lactamase) producing bacteria infection 10/31/2023   Bacteremia due to Klebsiella pneumoniae 10/30/2023   Protein-calorie malnutrition, severe 10/30/2023   Shock (HCC) 10/29/2023   History of MDR Pseudomonas aeruginosa infection 06/07/2023   Malnutrition of moderate degree 06/06/2023   Wound dehiscence in puerperium, perineal 06/04/2023   AMS (altered mental status) 05/07/2023   Seizure-like activity (HCC) 05/06/2023   Altered mental status 05/06/2023   Fournier's gangrene in male 04/02/2023   Soft tissue infection 04/02/2023   Necrotizing soft tissue infection 04/02/2023   UTI (urinary tract infection) due to urinary indwelling catheter (HCC) 04/01/2023   S/P BKA  (below knee amputation) unilateral, left (HCC) 04/01/2023   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) 04/01/2023   Long term current use of systemic steroids 04/01/2023   Thrombocytopenia (HCC) 10/01/2022   Orthostatic hypotension 09/24/2022   HFrEF (heart failure with reduced ejection fraction) (HCC) 09/23/2022   Counseling and coordination of care 09/15/2022   Pain 09/14/2022   High risk medication use 09/14/2022   Need for emotional support 09/12/2022   Mood altered 09/11/2022   Medication management 09/11/2022   Palliative care by specialist 09/08/2022   Goals of care, counseling/discussion 09/08/2022   Physical deconditioning 09/05/2022   Gross hematuria 08/31/2022   Type 2 diabetes mellitus with diabetic peripheral angiopathy without gangrene, without long-term current use of insulin (HCC)    Impaired functional mobility, balance, gait, and endurance 08/03/2022   Obstructive sleep apnea syndrome 08/03/2022   Pleural effusion 08/03/2022   Catheter-associated urinary tract infection (HCC) 08/03/2022   History of urinary retention 08/03/2022   Mitral valve regurgitation 08/03/2022   Thoracic ascending aortic aneurysm (HCC) 07/23/2022   Borderline low blood pressure determined by examination 07/04/2022   Coronary artery disease due to lipid rich plaque 06/25/2022   Hematuria 06/25/2022   History of non-ST elevation myocardial infarction (NSTEMI) 06/15/2022   PAF (paroxysmal atrial fibrillation) (HCC) 06/15/2022   Dyslipidemia 06/15/2022   PVD (peripheral vascular disease) (HCC) 06/15/2022   Pressure ulcer of sacral region, stage 2 (HCC) 06/15/2022   Chronic HFrEF (heart failure with reduced ejection fraction) (HCC) 06/15/2022   DNR (do not resuscitate) 06/15/2022   Ischemic cardiomyopathy 02/12/2022   Need for assistance with personal care 08/03/2021   Diabetic polyneuropathy associated with type 2 diabetes mellitus (HCC) 07/28/2021   Prolonged QT  interval 07/28/2021   Moderate protein-calorie malnutrition (HCC) 06/29/2021   Chronic insomnia 03/31/2021   BPH with obstruction/lower urinary tract symptoms 02/02/2018   Hyperlipidemia 04/15/2016   Vitamin D deficiency 04/09/2016   Essential hypertension 01/25/2016    Palliative Care Assessment & Plan   Patient Profile: 68 y.o. male  with past medical history of Fournier's gangrene, DM, chronic hypotension, ureterocutaneous fistula, and history of debility requiring SNF/ rehab since BKA for necrotizing fasciitis admitted on 10/29/2023 with septic shock - suspected urinary source. Patient with hypotension requiring vasopressors. PMT consulted to discuss GOC.   Assessment: Principal Problem:   Bacteremia due to Klebsiella pneumoniae Active Problems:   Pleural effusion   Shock (HCC)   Protein-calorie malnutrition, severe   ESBL (extended spectrum beta-lactamase) producing bacteria infection   Terminal care  Recommendations/Plan: Continue full comfort measures Continue DNR/DNI as previously documented  Transfer to [facility] when bed available Continue current comfort focused medication regimen - no changes PMT will continue to follow and support holistically   Symptom Management Morphine PRN severe pain/dyspnea/increased work of breathing/RR>25 Oxycodone PRN moderate pain Tylenol PRN mild pain/fever Biotene oral care twice daily Benadryl PRN itching Robinul PRN secretions Haldol PRN agitation/delirium Ativan PRN anxiety/seizure/sleep/distress Zofran PRN nausea/vomiting Liquifilm Tears PRN dry eye  Goals of Care and Additional Recommendations: Limitations on Scope of Treatment: Full Comfort Care  Code Status:    Code Status Orders  (From admission, onward)           Start     Ordered   11/05/23 1254  Do not attempt resuscitation (DNR) - Comfort care  Continuous       Question Answer Comment  If patient has no pulse and is not breathing Do Not Attempt  Resuscitation   In Pre-Arrest Conditions (Patient Is Breathing and Has a Pulse) Provide comfort measures. Relieve any mechanical airway obstruction. Avoid transfer unless required for comfort.   Consent: Discussion documented in EHR or advanced directives reviewed      11/05/23 1255           Code Status History     Date Active Date Inactive Code Status Order ID Comments User Context   11/04/2023 0926 11/05/2023 1255 Do not attempt resuscitation (DNR) - Comfort care [id number]  Starleen Arms, MD Inpatient   10/29/2023 1640 11/04/2023 0926 Limited: Do not attempt resuscitation (DNR) -DNR-LIMITED -Do Not Intubate/DNI  [id number]  Steffanie Dunn, DO ED   10/29/2023 1546 10/29/2023 1640 Limited: Do not attempt resuscitation (DNR) -DNR-LIMITED -Do Not Intubate/DNI  [id number]  Rondel Baton, MD ED   06/04/2023 1201 06/19/2023 1857 Full Code [id number]  Lurline Del, MD Inpatient   05/06/2023 1013 05/13/2023 2221 DNR [id number]  Merlene Laughter, DO ED   04/01/2023 2237 04/18/2023 1911 DNR [id number]  Carollee Herter, DO ED   08/31/2022 1946 10/24/2022 1827 DNR [id number]  Hillary Bow, DO ED   08/03/2022 0859 08/17/2022 0705 DNR [id number]  Littie Deeds, MD ED   08/02/2022 1829 08/03/2022 0859 Full Code [id number]  Levin Erp, MD ED   06/25/2022 1559 06/30/2022 2102 DNR [id number]  Orland Mustard, MD ED   06/17/2022 1246 06/19/2022 0420 DNR [id number]  Yvonne Kendall, MD Inpatient   06/17/2022 1054 06/17/2022 1246 Full Code [id number]  Yvonne Kendall, MD Inpatient   06/15/2022 1211 06/17/2022 1054 DNR [id number]  Jonah Blue, MD ED   06/15/2022 0604 06/15/2022 1211 Full Code [id number]  Angie Fava, DO ED   07/28/2021 0128 07/31/2021 0211 Full Code [id number]  Shalhoub, Deno Lunger, MD ED        Discharge Planning: Hospice facility  Care plan was discussed with primary RN and friend [name] as well as briefly with patient though unclear how much he understands  Thank you for allowing the  Palliative Medicine Team to assist  in the care of this patient.  Time Total: 30 minutes Time spent includes: Detailed review of medical records (labs, imaging, vital signs), medically appropriate exam, discussion with treatment team, counseling and educating patient, family and/or staff, documenting clinical information, medication management and coordination of care.    Janeece Agee, NP  Please contact Palliative Medicine Team phone at [phone] for questions and concerns.   *Portions of this note are a verbal dictation therefore any spelling and/or grammatical errors are due to the "Dragon Medical One" system interpretation.

## 2023-11-07 NOTE — Plan of Care (Signed)
  Problem: Education: Goal: Ability to describe self-care measures that may prevent or decrease complications (Diabetes Survival Skills Education) will improve Outcome: Progressing Goal: Individualized Educational Video(s) Outcome: Progressing   Problem: Coping: Goal: Ability to adjust to condition or change in health will improve Outcome: Progressing   Problem: Fluid Volume: Goal: Ability to maintain a balanced intake and output will improve Outcome: Progressing   Problem: Health Behavior/Discharge Planning: Goal: Ability to identify and utilize available resources and services will improve Outcome: Progressing Goal: Ability to manage health-related needs will improve Outcome: Progressing   Problem: Metabolic: Goal: Ability to maintain appropriate glucose levels will improve Outcome: Progressing   Problem: Skin Integrity: Goal: Risk for impaired skin integrity will decrease Outcome: Progressing   Problem: Tissue Perfusion: Goal: Adequacy of tissue perfusion will improve Outcome: Progressing   Problem: Education: Goal: Knowledge of General Education information will improve Description: Including pain rating scale, medication(s)/side effects and non-pharmacologic comfort measures Outcome: Progressing   Problem: Health Behavior/Discharge Planning: Goal: Ability to manage health-related needs will improve Outcome: Progressing   Problem: Clinical Measurements: Goal: Ability to maintain clinical measurements within normal limits will improve Outcome: Progressing Goal: Will remain free from infection Outcome: Progressing Goal: Diagnostic test results will improve Outcome: Progressing Goal: Respiratory complications will improve Outcome: Progressing Goal: Cardiovascular complication will be avoided Outcome: Progressing   Problem: Activity: Goal: Risk for activity intolerance will decrease Outcome: Progressing   Problem: Coping: Goal: Level of anxiety will  decrease Outcome: Progressing   Problem: Elimination: Goal: Will not experience complications related to bowel motility Outcome: Progressing Goal: Will not experience complications related to urinary retention Outcome: Progressing   Problem: Pain Management: Goal: General experience of comfort will improve Outcome: Progressing   Problem: Safety: Goal: Ability to remain free from injury will improve Outcome: Progressing   Problem: Skin Integrity: Goal: Risk for impaired skin integrity will decrease Outcome: Progressing   Problem: Education: Goal: Knowledge of the prescribed therapeutic regimen will improve Outcome: Progressing   Problem: Coping: Goal: Ability to identify and develop effective coping behavior will improve Outcome: Progressing   Problem: Clinical Measurements: Goal: Quality of life will improve Outcome: Progressing   Problem: Respiratory: Goal: Verbalizations of increased ease of respirations will increase Outcome: Progressing   Problem: Role Relationship: Goal: Family's ability to cope with current situation will improve Outcome: Progressing Goal: Ability to verbalize concerns, feelings, and thoughts to partner or family member will improve Outcome: Progressing   Problem: Pain Management: Goal: Satisfaction with pain management regimen will improve Outcome: Progressing

## 2023-11-07 NOTE — Plan of Care (Signed)
  Problem: Fluid Volume: Goal: Ability to maintain a balanced intake and output will improve Outcome: Progressing   Problem: Nutritional: Goal: Maintenance of adequate nutrition will improve Outcome: Progressing   Problem: Skin Integrity: Goal: Risk for impaired skin integrity will decrease Outcome: Progressing   

## 2023-11-08 DIAGNOSIS — B961 Klebsiella pneumoniae [K. pneumoniae] as the cause of diseases classified elsewhere: Secondary | ICD-10-CM | POA: Diagnosis not present

## 2023-11-08 DIAGNOSIS — J90 Pleural effusion, not elsewhere classified: Secondary | ICD-10-CM | POA: Diagnosis not present

## 2023-11-08 DIAGNOSIS — E43 Unspecified severe protein-calorie malnutrition: Secondary | ICD-10-CM | POA: Diagnosis not present

## 2023-11-08 DIAGNOSIS — R7881 Bacteremia: Secondary | ICD-10-CM | POA: Diagnosis not present

## 2023-11-08 NOTE — Progress Notes (Signed)
[facility] Liaison Note    Unfortunately, [facility] is not able to offer a bed today. TOC manager aware.    Hospital liaison will follow up tomorrow or sooner if a room becomes available.    Please do not hesitate to call with questions.    Glenna Fellows, BSN, RN, [facility] Liaison [phone]

## 2023-11-08 NOTE — Plan of Care (Signed)
  Problem: Skin Integrity: Goal: Risk for impaired skin integrity will decrease Outcome: Progressing   Problem: Health Behavior/Discharge Planning: Goal: Ability to manage health-related needs will improve Outcome: Progressing   Problem: Clinical Measurements: Goal: Will remain free from infection Outcome: Progressing   Problem: Pain Management: Goal: General experience of comfort will improve Outcome: Progressing   Problem: Skin Integrity: Goal: Risk for impaired skin integrity will decrease Outcome: Progressing

## 2023-11-08 NOTE — TOC Transition Note (Signed)
Transition of Care [facility]) - Discharge Note   Patient Details  Name: Peter Yang MRN: [id number] Date of Birth: Feb 20, 1956  Transition of Care [facility]) CM/SW Contact:  Deatra Robinson, [place] Phone Number: 11/08/2023, 5:11 PM   Clinical Narrative:  Pt for dc to [facility] today. Per Shawn with [facility], pt's friend [name] has completed admission consents and they are prepared to admit. RN provided with number for report and PTAR arranged for transport. SW signing off at dc.   Dellie Burns, MSW, LCSW [phone] (coverage)       Final next level of care: Hospice Medical Facility Barriers to Discharge: Barriers Resolved   Patient Goals and CMS Choice   CMS Medicare.gov Compare Post Acute Care list provided to:: Patient Choice offered to / list presented to : Patient      Discharge Placement                Patient to be transferred to facility by: PTAR Name of family member notified: [name]/friend Patient and family notified of of transfer: 11/08/23  Discharge Plan and Services Additional resources added to the After Visit Summary for     Discharge Planning Services: CM Consult Post Acute Care Choice: Skilled Nursing Facility                               Social Drivers of Health (SDOH) Interventions SDOH Screenings   Food Insecurity: No Food Insecurity (10/30/2023)  Housing: Low Risk  (10/30/2023)  Transportation Needs: No Transportation Needs (10/30/2023)  Utilities: Not At Risk (10/30/2023)  Depression (PHQ2-9): Low Risk  (09/01/2023)  Financial Resource Strain: Low Risk  (08/13/2018)   [facility] from [facility] visits prior to 12/21/2022., [facility] visits prior to 12/21/2022.  Social Connections: Socially Isolated (10/30/2023)  Tobacco Use: Low Risk  (10/30/2023)     Readmission Risk Interventions    05/07/2023    4:42 PM 10/24/2022   11:21 AM 09/02/2022   10:36 AM  Readmission Risk  Prevention Plan  Transportation Screening Complete Complete Complete  Medication Review Oceanographer)  Complete Complete  PCP or Specialist appointment within 3-5 days of discharge Complete Complete Complete  HRI or Home Care Consult Complete Complete Complete  SW Recovery Care/Counseling Consult Complete Complete Complete  Palliative Care Screening Not Applicable Not Applicable Not Applicable  Skilled Nursing Facility Complete Complete Complete

## 2023-11-08 NOTE — Progress Notes (Signed)
Benedetto Goad to be D/C'd to [facility] per MD order. Report called to Olegario Messier, Charity fundraiser at [facility]. Skin clean and dry, dressing changed to sacral wound this am. Dressing clean, dry, and intact to R heel. Patient has scattered abrasions and bruising. Midline removed. Site without signs and symptoms of complications. Dressing and pressure applied. PIV left in place d/t patient having IV meds ordered after discharge. Currently awaiting PTAR. Jon Gills  11/08/2023 6:41 PM

## 2023-11-08 NOTE — Progress Notes (Addendum)
Daily Progress Note   Patient Name: Peter Yang       Date: 11/08/2023 DOB: 01/31/1956  Age: 68 y.o. MRN#: [id number] Attending Physician: Maretta Bees, MD Primary Care Physician: Patient, No Pcp Per Admit Date: 10/29/2023  Reason for Consultation/Follow-up: Establishing goals of care  Subjective: Patient sleeping but easily arouses to voice in NAD. Reports he is comfortable. No family at bedside.  Length of Stay: 10  Current Medications: Scheduled Meds:   antiseptic oral rinse  15 mL Topical BID   collagenase   Topical Daily   liver oil-zinc oxide   Topical BID    Continuous Infusions:   PRN Meds: acetaminophen **OR** acetaminophen, diphenhydrAMINE, feeding supplement, glycopyrrolate **OR** glycopyrrolate **OR** glycopyrrolate, haloperidol **OR** haloperidol **OR** haloperidol lactate, LORazepam **OR** LORazepam **OR** LORazepam, morphine injection, ondansetron **OR** ondansetron (ZOFRAN) IV, oxyCODONE, polyethylene glycol, polyvinyl alcohol  Physical Exam Constitutional:      General: He is sleeping.     Appearance: He is ill-appearing.  Pulmonary:     Effort: No respiratory distress.  Skin:    General: Skin is warm and dry.  Neurological:     Mental Status: He is easily aroused. He is lethargic.             Vital Signs: BP (!) 97/56 (BP Location: Right Arm)   Pulse (!) 116   Temp 98.5 F (36.9 C) (Oral)   Resp 20   Ht 6' (1.829 m)   Wt 102.7 kg   SpO2 95%   BMI 30.71 kg/m  SpO2: SpO2: 95 % O2 Device: O2 Device: Nasal Cannula O2 Flow Rate: O2 Flow Rate (L/min): 2 L/min      Palliative Assessment/Data: 20%      Patient Active Problem List   Diagnosis Date Noted   ESBL (extended spectrum beta-lactamase) producing bacteria infection 10/31/2023    Bacteremia due to Klebsiella pneumoniae 10/30/2023   Protein-calorie malnutrition, severe 10/30/2023   Shock (HCC) 10/29/2023   History of MDR Pseudomonas aeruginosa infection 06/07/2023   Malnutrition of moderate degree 06/06/2023   Wound dehiscence in puerperium, perineal 06/04/2023   AMS (altered mental status) 05/07/2023   Seizure-like activity (HCC) 05/06/2023   Altered mental status 05/06/2023   Fournier's gangrene in male 04/02/2023   Soft tissue infection 04/02/2023   Necrotizing soft tissue infection 04/02/2023  UTI (urinary tract infection) due to urinary indwelling catheter (HCC) 04/01/2023   S/P BKA (below knee amputation) unilateral, left (HCC) 04/01/2023   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) 04/01/2023   Long term current use of systemic steroids 04/01/2023   Thrombocytopenia (HCC) 10/01/2022   Orthostatic hypotension 09/24/2022   HFrEF (heart failure with reduced ejection fraction) (HCC) 09/23/2022   Counseling and coordination of care 09/15/2022   Pain 09/14/2022   High risk medication use 09/14/2022   Need for emotional support 09/12/2022   Mood altered 09/11/2022   Medication management 09/11/2022   Palliative care by specialist 09/08/2022   Goals of care, counseling/discussion 09/08/2022   Physical deconditioning 09/05/2022   Gross hematuria 08/31/2022   Type 2 diabetes mellitus with diabetic peripheral angiopathy without gangrene, without long-term current use of insulin (HCC)    Impaired functional mobility, balance, gait, and endurance 08/03/2022   Obstructive sleep apnea syndrome 08/03/2022   Pleural effusion 08/03/2022   Catheter-associated urinary tract infection (HCC) 08/03/2022   History of urinary retention 08/03/2022   Mitral valve regurgitation 08/03/2022   Thoracic ascending aortic aneurysm (HCC) 07/23/2022   Borderline low blood pressure determined by examination 07/04/2022   Coronary artery disease due to lipid  rich plaque 06/25/2022   Hematuria 06/25/2022   History of non-ST elevation myocardial infarction (NSTEMI) 06/15/2022   PAF (paroxysmal atrial fibrillation) (HCC) 06/15/2022   Dyslipidemia 06/15/2022   PVD (peripheral vascular disease) (HCC) 06/15/2022   Pressure ulcer of sacral region, stage 2 (HCC) 06/15/2022   Chronic HFrEF (heart failure with reduced ejection fraction) (HCC) 06/15/2022   DNR (do not resuscitate) 06/15/2022   Ischemic cardiomyopathy 02/12/2022   Need for assistance with personal care 08/03/2021   Diabetic polyneuropathy associated with type 2 diabetes mellitus (HCC) 07/28/2021   Prolonged QT interval 07/28/2021   Moderate protein-calorie malnutrition (HCC) 06/29/2021   Chronic insomnia 03/31/2021   BPH with obstruction/lower urinary tract symptoms 02/02/2018   Hyperlipidemia 04/15/2016   Vitamin D deficiency 04/09/2016   Essential hypertension 01/25/2016    Palliative Care Assessment & Plan   Patient Profile: 68 y.o. male with past medical history of Fournier's gangrene, DM, chronic hypotension, ureterocutaneous fistula, and history of debility requiring SNF/ rehab since BKA for necrotizing fasciitis admitted on 10/29/2023 with septic shock - suspected urinary source. Patient with hypotension requiring vasopressors. PMT consulted to discuss GOC.   Today's Discussion: Patient reports no pain or discomfort but did require prn pain medication overnight. We reviewed he has comfort medications available as needed. Encouraged him to notify staff if symptoms present. Discussed that the plan remains the same- transfer to [facility]. He reports no needs at this time, but would like me to call his friend [name] to update her.  Left voicemail for Michaelle Copas.  Recommendations/Plan: DNR/DNI Full comfort measures Transfer to [facility] when bed is available Symptom management with comfort meds per [facility] PMT will continue to support    Code Status:    Code Status  Orders  (From admission, onward)           Start     Ordered   11/05/23 1254  Do not attempt resuscitation (DNR) - Comfort care  Continuous       Question Answer Comment  If patient has no pulse and is not breathing Do Not Attempt Resuscitation   In Pre-Arrest Conditions (Patient Is Breathing and Has a Pulse) Provide comfort measures. Relieve any mechanical airway obstruction. Avoid transfer  unless required for comfort.   Consent: Discussion documented in EHR or advanced directives reviewed      11/05/23 1255         Extensive chart review has been completed prior to seeing the patient including progress/consult notes, orders, medications, and available advance directive documents.  Care plan was discussed with bedside RN  Thank you for allowing the Palliative Medicine Team to assist in the care of this patient.   Sherryll Burger, NP  Please contact Palliative Medicine Team phone at [phone] for questions and concerns.

## 2023-11-08 NOTE — Progress Notes (Signed)
PROGRESS NOTE        PATIENT DETAILS Name: Peter Yang Age: 68 y.o. Sex: male Date of Birth: 12-25-1955 Admit Date: 10/29/2023 Admitting Physician Peter Dunn, DO [id number], No Pcp Per  Brief Summary: Patient is a 68 y.o.  male with history of left BKA, history of Fournier's gangrene, chronic HFrEF, adrenal insufficiency, PAF,, chronic indwelling Foley catheter, chronic pain syndrome-who was admitted to the ICU on 1/8 for septic shock in the setting of ESBL bacteremia.  Significant events: 1/8>> admit to ICU-septic shock-ESBL bacteremia 1/11>> transferred to [facility] 1/14>> encephalopathic/hypotensive-stress dose steroids/midodrine/IVF/IV albumin-Dr. Elgergawy discussed with POA-no further escalation in care.  Monitor for 1-2 days-if no improvement-plan for hospice. 1/15>> transition to full comfort measures   Significant studies: 1/8>> CT head: No acute intracranial malady 1/8>> CT abdomen/pelvis: Mild bilateral hydronephrosis-moderate to large bilateral pleural effusions 1/9>> renal ultrasound: No hydronephrosis-improved from prior CT.  Significant microbiology data: 1/8>> COVID/influenza/RSV PCR: Negative 1/8 >>respiratory virus panel: Negative 1/8>> blood culture: ESBL Klebsiella pneumoniae 1/8>> pleural fluid: No growth  Procedures: 1/8>> thoracocentesis-transudative  Consults: None  Subjective: Sleeping comfortably-no complaints.  Awaiting residential hospice bed.  Objective: Vitals: Blood pressure (!) 97/56, pulse (!) 116, temperature 98.5 F (36.9 C), temperature source Oral, resp. rate 20, height 6' (1.829 m), weight 102.7 kg, SpO2 95%.   Exam: Not in any distress  Pertinent Labs/Radiology:    Latest Ref Rng & Units 11/05/2023    4:27 AM 11/04/2023    4:53 PM 11/04/2023    4:46 AM  CBC  WBC 4.0 - 10.5 K/uL 12.7  16.1  18.8   Hemoglobin 13.0 - 17.0 g/dL 7.6  8.2  7.6   Hematocrit 39.0 - 52.0 % 22.8  24.2  22.3   Platelets  150 - 400 K/uL 149  167  209     Lab Results  Component Value Date   NA 133 (L) 11/05/2023   K 4.6 11/05/2023   CL 101 11/05/2023   CO2 23 11/05/2023     Assessment/Plan: Septic shock secondary to ESBL Klebsiella pneumoniae bacteremia Initially required ICU admission/pressors-and had improved-however on 1/14-developed encephalopathy/hypotension.  He was maintained on meropenem-was briefly given stress dose steroids/midodrine/IV albumin-goals of care conversation was continued with family-and subsequently transition to full comfort measures on 1/15.  Awaiting residential hospice.-Remains hypotensive this morning-but medically stable for transfer to residential hospice.  Overall prognosis is poor-life expectancy is suspected to be a few days.     Acute metabolic encephalopathy Secondary to above-lethargic-sleepy Supportive care comfort care in progress.   Normocytic anemia Likely due to acute illness-no indication of blood loss Has required 2 units of PRBC-last transfused on 1/14 No plans to follow CBC-last hemoglobin 7.6 on 1/15.  Comfort care.   Pleural effusion Transudative Cultures negative   Chronic hypotension Blood pressure remains soft-renal function stable No longer on midodrine/albumin/steroids.   AKI Resolved   PAF Telemetry monitoring Eliquis was held due to severity of anemia-no plans to resume-comfort care in progress.   Adrenal insufficiency On chronic steroids-maintained on steroids here-and then given IV hydrocortisone due to recurrent hypotension in 1/14-currently off all steroids-comfort care in progress.    Chronic HFrEF (EF 35% by echo on June 2024) Some lower extremity edema but lying flat Stable-no plans to resume diuretics at this point.   DM-2 Managed with SSI   Chronic pain syndrome  Supportive care   History of Fournier's gangrene History of left BKA Long-term SNF resident   Chronic indwelling Foley catheter   Palliative care DNR in  place Prior MD had a long discussion with patient's POA-Peter Yang-no ICU transfer-no pressors-no further escalation in care.  Subsequently palliative care prolonged discussion with family on 1/15-since patient did not significantly improved-remained hypotensive/lethargic-transition to full comfort measures-awaiting residential hospice bed.  Poor quality of life-very poor overall prognosis.     Nutrition Status: Nutrition Problem: Severe Malnutrition Etiology: chronic illness Signs/Symptoms: severe fat depletion, severe muscle depletion, energy intake < or equal to 75% for > or equal to 1 month Interventions: Ensure Enlive (each supplement provides 350kcal and 20 grams of protein), Liberalize Diet  Pressure Ulcer: Agree with assessment and plan as outlined below. Pressure Injury 10/29/23 Heel Right Deep Tissue Pressure Injury - Purple or maroon localized area of discolored intact skin or blood-filled blister due to damage of underlying soft tissue from pressure and/or shear. 3.5x2.5 purple (Active)  10/29/23 2000  Location: Heel  Location Orientation: Right  Staging: Deep Tissue Pressure Injury - Purple or maroon localized area of discolored intact skin or blood-filled blister due to damage of underlying soft tissue from pressure and/or shear.  Wound Description (Comments): 3.5x2.5 purple  Present on Admission: Yes  Dressing Type Foam - Lift dressing to assess site every shift 11/08/23 0900     Pressure Injury 10/30/23 Sacrum Medial Unstageable - Full thickness tissue loss in which the base of the injury is covered by slough (yellow, tan, gray, green or brown) and/or eschar (tan, brown or black) in the wound bed. 7 cm x 6 cm 100% black tan necr (Active)  10/30/23 1002  Location: Sacrum  Location Orientation: Medial  Staging: Unstageable - Full thickness tissue loss in which the base of the injury is covered by slough (yellow, tan, gray, green or brown) and/or eschar (tan, brown or black) in  the wound bed.  Wound Description (Comments): 7 cm x 6 cm 100% black tan necrotic tissue  Present on Admission: Yes  Dressing Type Foam - Lift dressing to assess site every shift 11/08/23 0900   Class 1 Obesity: Estimated body mass index is 30.71 kg/m as calculated from the following:   Height as of this encounter: 6' (1.829 m).   Weight as of this encounter: 102.7 kg.    The patient is critically ill with multiple organ system failure and requires high complexity decision making for assessment and support, frequent evaluation and titration of therapies, advanced monitoring, review of radiographic studies and interpretation of complex data.   Total Critical time spent equals 45 minutes  Code status:   Code Status: Do not attempt resuscitation (DNR) - Comfort care   DVT Prophylaxis:    Family Communication: Left voicemail for her friend/POA-[name] Yang-[phone] on 1/15.   Disposition Plan: Status is: Inpatient Remains inpatient appropriate because: Severity of illness   Planned Discharge Destination:Skilled nursing facility   Diet: Diet Order             Diet - low sodium heart healthy           Diet regular Room service appropriate? Yes; Fluid consistency: Thin  Diet effective now                     Antimicrobial agents: Anti-infectives (From admission, onward)    Start     Dose/Rate Route Frequency Ordered Stop   10/31/23 0600  meropenem (MERREM) 1 g  in sodium chloride 0.9 % 100 mL IVPB  Status:  Discontinued        1 g 200 mL/hr over 30 Minutes Intravenous Every 8 hours 10/30/23 2252 11/05/23 1255   10/29/23 2300  metroNIDAZOLE (FLAGYL) IVPB 500 mg  Status:  Discontinued        500 mg 100 mL/hr over 60 Minutes Intravenous Every 8 hours 10/29/23 1737 10/30/23 0958   10/29/23 1745  ceftazidime-avibactam (AVYCAZ) 1.25 g in dextrose 5 % 50 mL IVPB  Status:  Discontinued        1.25 g 25 mL/hr over 2 Hours Intravenous Every 8 hours 10/29/23 1737 10/30/23  2243   10/29/23 1645  doxycycline (VIBRAMYCIN) 100 mg in dextrose 5 % 250 mL IVPB  Status:  Discontinued        100 mg 125 mL/hr over 120 Minutes Intravenous Every 12 hours 10/29/23 1642 10/30/23 0957   10/29/23 1430  vancomycin (VANCOREADY) IVPB 1750 mg/350 mL        1,750 mg 175 mL/hr over 120 Minutes Intravenous  Once 10/29/23 1423 10/29/23 1827   10/29/23 1415  ceFEPIme (MAXIPIME) 2 g in sodium chloride 0.9 % 100 mL IVPB        2 g 200 mL/hr over 30 Minutes Intravenous  Once 10/29/23 1409 10/29/23 1552   10/29/23 1415  metroNIDAZOLE (FLAGYL) IVPB 500 mg        500 mg 100 mL/hr over 60 Minutes Intravenous  Once 10/29/23 1409 10/29/23 1645   10/29/23 1415  vancomycin (VANCOCIN) IVPB 1000 mg/200 mL premix  Status:  Discontinued        1,000 mg 200 mL/hr over 60 Minutes Intravenous  Once 10/29/23 1409 10/29/23 1423        MEDICATIONS: Scheduled Meds:  antiseptic oral rinse  15 mL Topical BID   collagenase   Topical Daily   liver oil-zinc oxide   Topical BID   Continuous Infusions:   PRN Meds:.acetaminophen **OR** acetaminophen, diphenhydrAMINE, feeding supplement, glycopyrrolate **OR** glycopyrrolate **OR** glycopyrrolate, haloperidol **OR** haloperidol **OR** haloperidol lactate, LORazepam **OR** LORazepam **OR** LORazepam, morphine injection, ondansetron **OR** ondansetron (ZOFRAN) IV, oxyCODONE, polyethylene glycol, polyvinyl alcohol   I have personally reviewed following labs and imaging studies  LABORATORY DATA: CBC: Recent Labs  Lab 11/02/23 0241 11/03/23 0425 11/04/23 0446 11/04/23 1653 11/05/23 0427  WBC 23.7* 17.7* 18.8* 16.1* 12.7*  HGB 9.5* 7.3* 7.6* 8.2* 7.6*  HCT 28.5* 21.2* 22.3* 24.2* 22.8*  MCV 86.9 86.5 85.1 84.9 86.4  PLT 300 211 209 167 149*    Basic Metabolic Panel: Recent Labs  Lab 11/02/23 0241 11/03/23 0425 11/04/23 0446 11/04/23 0449 11/05/23 0427  NA 130* 126*  --  131* 133*  K 3.8 4.1  --  4.2 4.6  CL 96* 95*  --  99 101  CO2 24  23  --  23 23  GLUCOSE 95 114*  --  125* 128*  BUN 42* 39*  --  40* 37*  CREATININE 0.76 0.73  --  0.99 0.85  CALCIUM 8.1* 7.9*  --  8.0* 8.2*  MG 2.5* 2.2 2.6*  --  3.0*  PHOS 2.4* 2.1* 2.7  --  3.0    GFR: Estimated Creatinine Clearance: 105.9 mL/min (by C-G formula based on SCr of 0.85 mg/dL).  Liver Function Tests: No results for input(s): "AST", "ALT", "ALKPHOS", "BILITOT", "PROT", "ALBUMIN" in the last 168 hours.  No results for input(s): "LIPASE", "AMYLASE" in the last 168 hours.  No results for input(s): "AMMONIA" in  the last 168 hours.  Coagulation Profile: No results for input(s): "INR", "PROTIME" in the last 168 hours.   Cardiac Enzymes: No results for input(s): "CKTOTAL", "CKMB", "CKMBINDEX", "TROPONINI" in the last 168 hours.  BNP (last 3 results) No results for input(s): "PROBNP" in the last 8760 hours.  Lipid Profile: No results for input(s): "CHOL", "HDL", "LDLCALC", "TRIG", "CHOLHDL", "LDLDIRECT" in the last 72 hours.  Thyroid Function Tests: No results for input(s): "TSH", "T4TOTAL", "FREET4", "T3FREE", "THYROIDAB" in the last 72 hours.  Anemia Panel: No results for input(s): "VITAMINB12", "FOLATE", "FERRITIN", "TIBC", "IRON", "RETICCTPCT" in the last 72 hours.  Urine analysis:    Component Value Date/Time   COLORURINE RED (A) 10/29/2023 1930   APPEARANCEUR TURBID (A) 10/29/2023 1930   LABSPEC  10/29/2023 1930    TEST NOT REPORTED DUE TO COLOR INTERFERENCE OF URINE PIGMENT   PHURINE  10/29/2023 1930    TEST NOT REPORTED DUE TO COLOR INTERFERENCE OF URINE PIGMENT   GLUCOSEU (A) 10/29/2023 1930    TEST NOT REPORTED DUE TO COLOR INTERFERENCE OF URINE PIGMENT   HGBUR (A) 10/29/2023 1930    TEST NOT REPORTED DUE TO COLOR INTERFERENCE OF URINE PIGMENT   BILIRUBINUR (A) 10/29/2023 1930    TEST NOT REPORTED DUE TO COLOR INTERFERENCE OF URINE PIGMENT   KETONESUR (A) 10/29/2023 1930    TEST NOT REPORTED DUE TO COLOR INTERFERENCE OF URINE PIGMENT    PROTEINUR (A) 10/29/2023 1930    TEST NOT REPORTED DUE TO COLOR INTERFERENCE OF URINE PIGMENT   NITRITE (A) 10/29/2023 1930    TEST NOT REPORTED DUE TO COLOR INTERFERENCE OF URINE PIGMENT   LEUKOCYTESUR (A) 10/29/2023 1930    TEST NOT REPORTED DUE TO COLOR INTERFERENCE OF URINE PIGMENT    Sepsis Labs: Lactic Acid, Venous    Component Value Date/Time   LATICACIDVEN 1.5 11/04/2023 0753    MICROBIOLOGY: Recent Results (from the past 240 hours)  Blood Culture (routine x 2)     Status: None   Collection Time: 10/29/23  2:57 PM   Specimen: BLOOD LEFT ARM  Result Value Ref Range Status   Specimen Description BLOOD LEFT ARM  Final   Special Requests   Final    BOTTLES DRAWN AEROBIC AND ANAEROBIC Blood Culture results may not be optimal due to an inadequate volume of blood received in culture bottles   Culture   Final    NO GROWTH 5 DAYS Performed at [facility], [address] [place], [place] [address]    Report Status 11/03/2023 FINAL  Final  Respiratory (~20 pathogens) panel by PCR     Status: None   Collection Time: 10/29/23  5:31 PM   Specimen: Pleural; Respiratory  Result Value Ref Range Status   Adenovirus NOT DETECTED NOT DETECTED Final   Coronavirus 229E NOT DETECTED NOT DETECTED Final    Comment: (NOTE) The Coronavirus on the Respiratory Panel, DOES NOT test for the novel  Coronavirus (2019 nCoV)    Coronavirus HKU1 NOT DETECTED NOT DETECTED Final   Coronavirus NL63 NOT DETECTED NOT DETECTED Final   Coronavirus OC43 NOT DETECTED NOT DETECTED Final   Metapneumovirus NOT DETECTED NOT DETECTED Final   Rhinovirus / Enterovirus NOT DETECTED NOT DETECTED Final   Influenza A NOT DETECTED NOT DETECTED Final   Influenza B NOT DETECTED NOT DETECTED Final   Parainfluenza Virus 1 NOT DETECTED NOT DETECTED Final   Parainfluenza Virus 2 NOT DETECTED NOT DETECTED Final   Parainfluenza Virus 3 NOT DETECTED  NOT DETECTED Final   Parainfluenza Virus 4 NOT DETECTED NOT DETECTED  Final   Respiratory Syncytial Virus NOT DETECTED NOT DETECTED Final   Bordetella pertussis NOT DETECTED NOT DETECTED Final   Bordetella Parapertussis NOT DETECTED NOT DETECTED Final   Chlamydophila pneumoniae NOT DETECTED NOT DETECTED Final   Mycoplasma pneumoniae NOT DETECTED NOT DETECTED Final    Comment: Performed at [facility], [address], [place], [place] [address]  Body fluid culture w Gram Stain     Status: None   Collection Time: 10/29/23  7:10 PM   Specimen: Pleural Fluid  Result Value Ref Range Status   Specimen Description PLEURAL  Final   Special Requests NONE  Final   Gram Stain   Final    WBC PRESENT,BOTH PMN AND MONONUCLEAR NO ORGANISMS SEEN CYTOSPIN SMEAR    Culture   Final    NO GROWTH 3 DAYS Performed at [facility], [address], [place], [place] [address]    Report Status 11/02/2023 FINAL  Final  MRSA Next Gen by PCR, Nasal     Status: Abnormal   Collection Time: 10/29/23  7:30 PM  Result Value Ref Range Status   MRSA by PCR Next Gen DETECTED (A) NOT DETECTED Final    Comment: RESULT CALLED TO, READ BACK BY AND VERIFIED WITH:  W. HUTCHES 10/29/2023 @ 2217 BY AB (NOTE) The GeneXpert MRSA Assay (FDA approved for NASAL specimens only), is one component of a comprehensive MRSA colonization surveillance program. It is not intended to diagnose MRSA infection nor to guide or monitor treatment for MRSA infections. Test performance is not FDA approved in patients less than 97 years old. Performed at [facility], [address] [place], [place] [address]     RADIOLOGY STUDIES/RESULTS: No results found.    LOS: 10 days   Jeoffrey Massed, MD  [facility]    To contact the attending provider between 7A-7P or the covering provider during after hours 7P-7A, please log into the web site [email or web address] and access using universal [place] password for that web site. If you do not have the password, please call the hospital  operator.  11/08/2023, 2:19 PM

## 2023-11-08 NOTE — TOC Progression Note (Signed)
Transition of Care [facility]) - Progression Note    Patient Details  Name: Peter Yang MRN: [id number] Date of Birth: [birth year]  Transition of Care [facility]) CM/SW Contact  [address] [place], [place] Phone Number: 11/08/2023, 10:19 AM  Clinical Narrative:     Confirmed with ACC Wende Crease, [facility] does not have a bed available today. He will continue to check throughout the day for bed availability.  Lillian Tigges, LCSW Transition of Care     Expected Discharge Plan: Skilled Nursing Facility Barriers to Discharge: Continued Medical Work up  Expected Discharge Plan and Services   Discharge Planning Services: CM Consult Post Acute Care Choice: Skilled Nursing Facility                                         Social Determinants of Health (SDOH) Interventions SDOH Screenings   Food Insecurity: No Food Insecurity (10/30/2023)  Housing: Low Risk  (10/30/2023)  Transportation Needs: No Transportation Needs (10/30/2023)  Utilities: Not At Risk (10/30/2023)  Depression (PHQ2-9): Low Risk  (09/01/2023)  Financial Resource Strain: Low Risk  (08/13/2018)   Received from [facility] visits prior to 12/21/2022., [facility] visits prior to 12/21/2022.  Social Connections: Socially Isolated (10/30/2023)  Tobacco Use: Low Risk  (10/30/2023)    Readmission Risk Interventions    05/07/2023    4:42 PM 10/24/2022   11:21 AM 09/02/2022   10:36 AM  Readmission Risk Prevention Plan  Transportation Screening Complete Complete Complete  Medication Review Oceanographer)  Complete Complete  PCP or Specialist appointment within 3-5 days of discharge Complete Complete Complete  HRI or Home Care Consult Complete Complete Complete  SW Recovery Care/Counseling Consult Complete Complete Complete  Palliative Care Screening Not Applicable Not Applicable Not Applicable  Skilled Nursing Facility Complete Complete Complete
